# Patient Record
Sex: Male | Born: 1935
Health system: Southern US, Community
[De-identification: ages and names within clinical notes are randomized; demographics above are authoritative.]

## PROBLEM LIST (undated history)

## (undated) DIAGNOSIS — E11319 Type 2 diabetes mellitus with unspecified diabetic retinopathy without macular edema: Secondary | ICD-10-CM

## (undated) DIAGNOSIS — M25551 Pain in right hip: Secondary | ICD-10-CM

## (undated) DIAGNOSIS — M25552 Pain in left hip: Secondary | ICD-10-CM

## (undated) DIAGNOSIS — G8929 Other chronic pain: Secondary | ICD-10-CM

## (undated) DIAGNOSIS — I2583 Coronary atherosclerosis due to lipid rich plaque: Secondary | ICD-10-CM

## (undated) DIAGNOSIS — M545 Low back pain: Secondary | ICD-10-CM

## (undated) DIAGNOSIS — Z794 Long term (current) use of insulin: Secondary | ICD-10-CM

## (undated) DIAGNOSIS — I2510 Atherosclerotic heart disease of native coronary artery without angina pectoris: Secondary | ICD-10-CM

## (undated) DIAGNOSIS — R0600 Dyspnea, unspecified: Secondary | ICD-10-CM

## (undated) DIAGNOSIS — E119 Type 2 diabetes mellitus without complications: Secondary | ICD-10-CM

## (undated) DIAGNOSIS — I10 Essential (primary) hypertension: Secondary | ICD-10-CM

## (undated) DIAGNOSIS — I779 Disorder of arteries and arterioles, unspecified: Secondary | ICD-10-CM

## (undated) DIAGNOSIS — M1990 Unspecified osteoarthritis, unspecified site: Secondary | ICD-10-CM

## (undated) DIAGNOSIS — K219 Gastro-esophageal reflux disease without esophagitis: Secondary | ICD-10-CM

## (undated) DIAGNOSIS — K5792 Diverticulitis of intestine, part unspecified, without perforation or abscess without bleeding: Secondary | ICD-10-CM

## (undated) DIAGNOSIS — J189 Pneumonia, unspecified organism: Secondary | ICD-10-CM

## (undated) DIAGNOSIS — N390 Urinary tract infection, site not specified: Secondary | ICD-10-CM

## (undated) DIAGNOSIS — H35039 Hypertensive retinopathy, unspecified eye: Secondary | ICD-10-CM

## (undated) DIAGNOSIS — F419 Anxiety disorder, unspecified: Secondary | ICD-10-CM

## (undated) DIAGNOSIS — C61 Malignant neoplasm of prostate: Secondary | ICD-10-CM

## (undated) DIAGNOSIS — E7800 Pure hypercholesterolemia, unspecified: Secondary | ICD-10-CM

## (undated) DIAGNOSIS — N189 Chronic kidney disease, unspecified: Secondary | ICD-10-CM

## (undated) HISTORY — PX: CATARACT EXTRACTION: SUR2

## (undated) HISTORY — PX: PROSTATE SURGERY: SHX751

## (undated) HISTORY — PX: CATARACT EXTRACTION, BILATERAL: SHX1313

## (undated) HISTORY — PX: ABDOMINAL SURGERY: SHX537

## (undated) HISTORY — PX: APPENDECTOMY: SHX54

## (undated) HISTORY — DX: Hypertensive retinopathy, unspecified eye: H35.039

## (undated) HISTORY — DX: Type 2 diabetes mellitus with unspecified diabetic retinopathy without macular edema: E11.319

## (undated) HISTORY — PX: EYE SURGERY: SHX253

## (undated) HISTORY — PX: HERNIA REPAIR: SHX51

## (undated) NOTE — *Deleted (*Deleted)
[facility] Note  08/07/2020     CHIEF COMPLAINT Patient presents for No chief complaint on file.   HISTORY OF PRESENT ILLNESS: Justin [name] is a 40 y.o. male who presents to the clinic today for:      Referring physician: [name], MD [id number] [place],  [place] [address]  HISTORICAL INFORMATION:   Selected notes from the MEDICAL RECORD NUMBER Referred by Dr. [name] for concern of mac edema OU   CURRENT MEDICATIONS: No current outpatient medications on file. (Ophthalmic Drugs)   No current facility-administered medications for this visit. (Ophthalmic Drugs)   Current Outpatient Medications (Other)  Medication Sig  . ACCU-CHEK SMARTVIEW test strip   . Accu-Chek Softclix Lancets lancets   . acetaminophen (TYLENOL) 500 MG tablet Take 1,000 mg by mouth every 4 (four) hours as needed for mild pain.   Marland Kitchen AMITIZA 24 MCG capsule Take 24 mcg by mouth as needed for constipation.   Marland Kitchen aspirin EC 81 MG tablet Take 81 mg by mouth daily.  . Blood Glucose Monitoring Suppl (ACCU-CHEK GUIDE) w/Device KIT   . chlorthalidone (HYGROTON) 50 MG tablet Take 25 mg by mouth daily.   . clobetasol cream (TEMOVATE) 0.05 %   . clonazePAM (KLONOPIN) 0.5 MG tablet Take 0.5 mg by mouth daily as needed for anxiety.  . DULoxetine (CYMBALTA) 30 MG capsule   . ezetimibe (ZETIA) 10 MG tablet Take 1 tablet (10 mg total) by mouth daily after supper.  . gabapentin (NEURONTIN) 300 MG capsule   . isosorbide mononitrate (IMDUR) 120 MG 24 hr tablet Take 1 tablet (120 mg total) by mouth daily.  Marland Kitchen JANUVIA 100 MG tablet Take 100 mg by mouth daily.   Marland Kitchen LANTUS SOLOSTAR 100 UNIT/ML Solostar Pen Inject 15-50 Units into the skin See admin instructions. Inject 50 units in the morning and 15 units in the evening  . LINZESS 145 MCG CAPS capsule   . metoprolol (LOPRESSOR) 50 MG tablet Take 25 mg by mouth 2 (two) times daily.  Marland Kitchen omeprazole (PRILOSEC) 40 MG capsule Take  40 mg by mouth daily.   . ramipril (ALTACE) 10 MG capsule   . simvastatin (ZOCOR) 20 MG tablet   . tamsulosin (FLOMAX) 0.4 MG CAPS capsule Take 0.4 mg by mouth daily.  . vitamin B-12 (CYANOCOBALAMIN) 1000 MCG tablet Take 1,000 mcg by mouth daily.  . vitamin B-6 (PYRIDOXINE) 25 MG tablet Take 25 mg by mouth daily.  [name] 2.5 MG TABS tablet TAKE 1 TABLET TWICE DAILY   No current facility-administered medications for this visit. (Other)      REVIEW OF SYSTEMS:    ALLERGIES Allergies  Allergen Reactions  . Contrast Media [Iodinated Diagnostic Agents] Itching    PT STATES ALLERGY TO "CT DYE".  [name] [Red Dye] Swelling    CAT scan dye  . Ibuprofen Other (See Comments)    Upset stomach     PAST MEDICAL HISTORY Past Medical History:  Diagnosis Date  . Anxiety   . Arthritis   . Coronary artery disease   . Diabetes mellitus without complication ([place])   . Diabetic retinopathy ([place])    NPDR OU  . Diverticulitis   . Dyspnea   . GERD (gastroesophageal reflux disease)   . Hypercholesteremia   . Hypertension   . Hypertensive retinopathy    OU  . Pneumonia    as a child  . Prostate cancer ([place])   .  UTI (lower urinary tract infection)    Past Surgical History:  Procedure Laterality Date  . ABDOMINAL SURGERY     for diverticulitis; also removed appendix  . CATARACT EXTRACTION    . CATARACT EXTRACTION, BILATERAL    . CHOLECYSTECTOMY N/A 10/12/2017   Procedure: LAPAROSCOPIC CHOLECYSTECTOMY;  Surgeon: [name], MD;  Location: [facility];  Service: General;  Laterality: N/A;  . CORONARY ARTERY BYPASS GRAFT  2009  . ERCP N/A 12/21/2018   Procedure: ENDOSCOPIC RETROGRADE CHOLANGIOPANCREATOGRAPHY (ERCP);  Surgeon: [name], MD;  Location: [facility];  Service: Endoscopy;  Laterality: N/A;  . ESOPHAGOGASTRODUODENOSCOPY (EGD) WITH PROPOFOL N/A 12/17/2018   Procedure: ESOPHAGOGASTRODUODENOSCOPY (EGD) WITH PROPOFOL;  Surgeon: [name], MD;  Location: [facility];  Service: Endoscopy;  Laterality: N/A;  . EUS Left 12/17/2018   Procedure: UPPER ENDOSCOPIC ULTRASOUND (EUS) LINEAR;  Surgeon: [name], MD;  Location: [facility];  Service: Endoscopy;  Laterality: Left;  . EYE SURGERY     "for bleeding in eye"  . HERNIA REPAIR    . LEFT HEART CATH AND CORONARY ANGIOGRAPHY N/A 11/04/2016   Procedure: Left Heart Cath and Coronary Angiography;  Surgeon: [name], MD;  Location: [facility];  Service: Cardiovascular;  Laterality: N/A;  . LOWER EXTREMITY ANGIOGRAPHY N/A 11/04/2016   Procedure: Lower Extremity Angiography;  Surgeon: [name], MD;  Location: [facility];  Service: Cardiovascular;  Laterality: N/A;  . LOWER EXTREMITY ANGIOGRAPHY N/A 01/03/2020   Procedure: LOWER EXTREMITY ANGIOGRAPHY;  Surgeon: [name], MD;  Location: [facility];  Service: Cardiovascular;  Laterality: N/A;  . PROSTATE SURGERY    . REMOVAL OF STONES  12/21/2018   Procedure: REMOVAL OF STONES;  Surgeon: [name], MD;  Location: [facility];  Service: Endoscopy;;  . [name]  12/21/2018   Procedure: [name];  Surgeon: [name], MD;  Location: [facility];  Service: Endoscopy;;    FAMILY HISTORY Family History  Problem Relation Age of Onset  . Diabetes Father   . Diabetes Maternal Aunt   . Diabetes Maternal Uncle   . Diabetes Maternal Grandmother   . Heart disease Sister   . Diabetes Brother   . Heart disease Sister     SOCIAL HISTORY Social History   Tobacco Use  . Smoking status: Former Smoker    Packs/day: 0.25    Years: 2.00    Pack years: 0.50    Types: Cigarettes  . Smokeless tobacco: Never Used  . Tobacco comment: when he was a teenage age [age]  Vaping Use  . Vaping Use: Never used  Substance Use Topics  . Alcohol use: No  . Drug use: No         OPHTHALMIC EXAM:  Not recorded     IMAGING AND PROCEDURES  Imaging and Procedures for _0 @           ASSESSMENT/PLAN:  No  diagnosis found.  1,2. Moderate non-proliferative diabetic retinopathy with edema OU   - moved to [place] from [place], [place] -- history of prior injections OS with Dr. [name]  - records received and reviewed from [facility] [place] (Dr. [name])  - history of focal laser OS x2 and IVK/IVT OU in [place] -- last injection was IVK OS November 2013  - initial exam: scattered IRH, DBH and +macular edema  - initial OCT: diabetic macular edema, both eyes, (OD > OS)  - FA (10.1.19) showed late leaking MA OU -- no NV  - delayed f/u on 4.16.20  due to hospitalization for sepsis / gallstones  - S/P PRP OD (07.14.20) - good laser surrounding, room for posterior fill in if needed  - S/P PRP OS (08.26.20)  - S/P IVA OD #1 (09.17.19), #2 (10.29.19), #3 (11.26.19), #4 (01.02.20), #5 (01.30.20), #6 (02.28.20), #7 (04.16.20), #8 (06.11.20), #9 (08.12.20), #10 (01.11.21)  - S/P IVA OS #1 (10.01.19), #2 (10.29.19), #3 (11.26.19), #4 (01.02.20), #5 (01.30.20), #6 (02.28.20), #7 (04.16.20), #8 (05.14.20), #9 (06.11.20), #10 (07.09.20), #11 (08.12.20), #12 (01.11.21)  - S/P IVK OD #1 (05.14.20) -- no significant improvement  - switched back to Avastin OD (#9 6.11.2020)  - repeated Eylea4U benefits investigation due to possible IVA resistance (8.12.20)  - S/P IVE OS #1 (09.16.20), #2 (10.16.20), #3 (11.13.20), #4 (12.11.20), #5 (02.08.21), #6 (03.12.21), #7 (04.16.21), #8 (05.14.21), #9 (06.18.21), #10 (07.23.21), #11 (8.30.21), #12 (10.5.21)  - S/P IVE OD #1 (09.16.20), #2 (10.16.20), #3 (11.13.20), #4 (12.11.20), #5 (02.08.21), #6 (03.12.21), #7 (04.16.21), #8 (05.14.21), #9 (06.18.21), #10 (07.23.21), #11 (8.30.21), #12 (10.5.21)  - OCT today shows mild interval improvement in cystic changes OS; OD persistent central cysts  - BCVA stable at 20/30 OU  - recommend IVE OU #13 today, 11.9.21  - Eylea4U benefits investigation completed and verified for 2021 as of 02.08.21  - pt wishes to proceed  - RBA of  procedure discussed, questions answered  - informed consent obtained  - see procedure note  - Eylea informed consent form signed and scanned on 12.11.2020  - f/u 5 weeks, DFE, OCT, possible injection  3,4. Hypertensive retinopathy OU  - discussed importance of tight BP control  - monitor  5. Pseudophakia OU  - s/p CE/IOL OU in [place], [place]  - beautiful surgeries, doing well  - monitor   Ophthalmic Meds Ordered this visit:  No orders of the defined types were placed in this encounter.      No follow-ups on file.  There are no Patient Instructions on file for this visit.  This document serves as a record of services personally performed by Gardiner Sleeper, MD, PhD. It was created on their behalf by Estill Bakes, COT an ophthalmic technician. The creation of this record is the provider's dictation and/or activities during the visit.    Electronically signed by: Estill Bakes, COT 11.4.21 @ 1:10 PM  Abbreviations: M myopia (nearsighted); A astigmatism; H hyperopia (farsighted); P presbyopia; Mrx spectacle prescription;  CTL contact lenses; OD right eye; OS left eye; OU both eyes  XT exotropia; ET esotropia; PEK punctate epithelial keratitis; PEE punctate epithelial erosions; DES dry eye syndrome; MGD meibomian gland dysfunction; ATs artificial tears; PFAT's preservative free artificial tears; [place] nuclear sclerotic cataract; PSC posterior subcapsular cataract; ERM epi-retinal membrane; PVD posterior vitreous detachment; RD retinal detachment; DM diabetes mellitus; DR diabetic retinopathy; NPDR non-proliferative diabetic retinopathy; PDR proliferative diabetic retinopathy; CSME clinically significant macular edema; DME diabetic macular edema; dbh dot blot hemorrhages; CWS cotton wool spot; POAG primary open angle glaucoma; C/D cup-to-disc ratio; HVF humphrey visual field; GVF goldmann visual field; OCT optical coherence tomography; IOP intraocular pressure; BRVO Branch retinal vein  occlusion; CRVO central retinal vein occlusion; CRAO central retinal artery occlusion; BRAO branch retinal artery occlusion; RT retinal tear; SB scleral buckle; PPV pars plana vitrectomy; VH Vitreous hemorrhage; PRP panretinal laser photocoagulation; IVK intravitreal kenalog; VMT vitreomacular traction; MH Macular hole;  NVD neovascularization of the disc; NVE neovascularization elsewhere; AREDS age related eye disease study; ARMD age related macular degeneration; POAG primary open angle glaucoma; EBMD epithelial/anterior basement  membrane dystrophy; ACIOL anterior chamber intraocular lens; IOL intraocular lens; PCIOL posterior chamber intraocular lens; Phaco/IOL phacoemulsification with intraocular lens placement; [place] photorefractive keratectomy; LASIK laser assisted in situ keratomileusis; HTN hypertension; DM diabetes mellitus; COPD chronic obstructive pulmonary disease

---

## 2007-09-30 DIAGNOSIS — I219 Acute myocardial infarction, unspecified: Secondary | ICD-10-CM

## 2007-09-30 HISTORY — PX: CORONARY ARTERY BYPASS GRAFT: SHX141

## 2007-09-30 HISTORY — DX: Acute myocardial infarction, unspecified: I21.9

## 2012-07-20 NOTE — Progress Notes (Signed)
Chief Complaint   Patient presents with   ??? Diabetes     pcp and pharmacy confirmed     History of Present Illness: Terry Hamilton is a 78 y.o. male who is a new patient for evaluation of diabetes.  Was diagnosed with diabetes in his early 15s and was put on lantus about 2 years ago and was initially on 30 units and has gone up to 50 units which he takes at 7pm and takes [place] in the morning.  Did take metformin but had diarrhea from this.  Checks blood sugars 2 times per day and readings run in the 80-130s fasting but can have some overnight lows into the 60s and tends to eat at bedtime to prevent lows.  Has had some readings in the 200s and one over 300.  30 day avg is 143.  Most recent Hgb A1c was 8.5% in July. A typical day is as follows:  - breakfast: sausage and egg or cereal with 1 slice of toast with coffee, occ pancakes  - lunch: half sandwich with applesauce, no chips or fries  - dinner: pork chop with broccoli or baked potato, chicken with cabbage or cauliflower, no rice, rare pasta, occ sandwich, 1-2 times a week no sugar added ice cream or peaches  - beverages: diet soda, unsweet drinks  - snacks: 1/2 sandwich or peaches before bed  Exercise consists of walking 3 days a week for about 30 minutes.  (+) history of vascular disease with CABG in 2008.  (+) history of retinopathy with laser treatments, (+) neuropathy, (+) mild nephropathy with microalbumin of 75 in 4/13.  Last eye exam was about 3 months ago and goes every 3 months.    Past Medical History   Diagnosis Date   ??? IBS (irritable bowel syndrome)    ??? Diverticulosis      with diverticulitis   ??? Other and unspecified hyperlipidemia    ??? Type II or unspecified type diabetes mellitus without mention of complication, uncontrolled Age 76   ??? CAD (coronary artery disease)      s/p CABG   ??? GERD (gastroesophageal reflux disease)    ??? Prostate cancer    ??? Unspecified essential hypertension      Past Surgical History   Procedure Laterality Date   ??? Hx  coronary artery bypass graft  2008   ??? Hx hernia repair       bilateral   ??? Hx appendectomy     ??? Hx cholecystectomy     ??? Hx laparotomy       for diverticulitis x3 with part of colon removed, wore colostomy for a shot period   ??? Hx prostatectomy     ??? Hx cataract removal       bilateral     Current Outpatient Prescriptions   Medication Sig   ??? clonazePAM (KLONOPIN) 0.5 mg tablet Take  by mouth nightly as needed.   ??? flavoxATE (URISPAS) 100 mg tablet Take 100 mg by mouth two (2) times a day.   ??? furosemide (LASIX) 20 mg tablet Take  by mouth every other day.   ??? sitaGLIPtin (JANUVIA) 100 mg tablet Take 100 mg by mouth daily.   ??? insulin glargine (LANTUS SOLOSTAR) 100 unit/mL (3 mL) pen 50 Units by SubCUTAneous route daily (after dinner).   ??? metoprolol (LOPRESSOR) 50 mg tablet Take  by mouth two (2) times a day.   ??? omeprazole (PRILOSEC) 20 mg capsule Take 20 mg by mouth daily.   ???  ramipril (ALTACE) 10 mg capsule Take 10 mg by mouth daily.   ??? simvastatin (ZOCOR) 20 mg tablet Take  by mouth nightly.   ??? lubiPROStone (AMITIZA) 8 mcg capsule Take 8 mcg by mouth daily (with breakfast).     No current facility-administered medications for this visit.     Allergies   Allergen Reactions   ??? Contrast Agent (Iodine) Rash   ??? Ibuprofen Other (comments)     GI upset   ??? Lipitor (Atorvastatin) Myalgia   ??? Zetia (Ezetimibe) Other (comments)     Had trouble with his eyes that improved upon stopping this med     Family History   Problem Relation Age of Onset   ??? Diabetes Father      with amputation   ??? Diabetes Maternal Grandmother      with blindness   ??? Diabetes Brother      History     Social History   ??? Marital Status: WIDOWED     Spouse Name: N/A     Number of Children: N/A   ??? Years of Education: N/A     Occupational History   ??? Not on file.     Social History Main Topics   ??? Smoking status: Former Smoker     Quit date: 07/20/1962   ??? Smokeless tobacco: Not on file   ??? Alcohol Use: No   ??? Drug Use: No   ??? Sexually Active:  Not on file     Other Topics Concern   ??? Not on file     Social History Narrative    Lives in [place] with his maternal aunt who is 26 yo.  Widowed since 1985.  Has 4 daughters and 3 step-sons.  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  Likes to work in the yard.       Review of Systems:  - Constitutional Symptoms: no fevers, chills, weight loss  - Eyes: some blurry vision with cloudy appearance, no double vision  - Cardiovascular: no chest pain or palpitations  - Respiratory: no cough or shortness of breath  - Gastrointestinal: no dysphagia, intermittent abdominal pain and alternating diarrhea and constipation  - Musculoskeletal: no joint pains or weakness  - Integumentary: no rashes  - Neurological: some numbness, tingling, no headaches  - Psychiatric: no depression or anxiety  - Endocrine: no heat or cold intolerance, no polyuria or polydipsia    Physical Examination:  Blood pressure 134/60, pulse 56, height 5' 7.5" (1.715 m), weight 188 lb 9.6 oz (85.548 kg).  - General: pleasant, no distress, good eye contact  - HEENT: no exopthalmos, no periorbital edema, no scleral/conjunctival injection, EOMI, no lid lag or stare  - Neck: supple, no thyromegaly, masses, lymph nodes, or carotid bruits, no supraclavicular or dorsocervical fat pads  - Cardiovascular: regular, normal rate, normal S1 and S2, no murmurs/rubs/gallops, 1+ dorsalis pedis pulses bilaterally  - Respiratory: clear to auscultation bilaterally  - Gastrointestinal: soft, nontender, nondistended, no masses, no hepatosplenomegaly  - Musculoskeletal: no proximal muscle weakness in upper or lower extremities  - Integumentary: no acanthosis nigricans, no abdominal striae, no rashes, no edema, no foot ulcers, (+) calluses bilaterally  - Neurological: decreased sensation to monofilament 4/4 locations, minimal vibratory sensation at great toes bilaterally,   - Psychiatric: normal mood and affect    Data Reviewed:   - Hgb A1c  8.5%  - lipids: total 162 ,  HDL 44, TG 79, LDL 102  4/13  - ALT 16, AST 18  - BUN/Cr 16/1.13  - microalbumin 75 4/13    Assessment/Plan:   1. Type II or unspecified type diabetes mellitus without mention of complication, uncontrolled: his most recent Hgb A1c was 8.5% in 7/13.  I'm concerned about his risk of overnight lows given he lives with a 56 yo aunt and his h/o CAD.  I agree that a goal A1c is 8% or less.  I will have him split the lantus to try and avoid the lows and see if he can get by without a bedtime snack.  Not sure how much the [place] is helping so will stop this for now given his duration of DM for almost 40 years.   - stop [place]  - split lantus to 20 units in the morning and 30 units at night  - check bs 2 times per day   - optho UTD every 3 months  - foot exam done 10/13  - microalbumin 75 4/13     2. Unspecified essential hypertension: his BP was above goal < 130/80 but given this was his first visit with me, will not make any changes at this time.   - cont metoprolol 50 mg bid  - cont ramipril 10 mg daily      3. Other and unspecified hyperlipidemia: Given DM and CAD, Goal LDL < 70, non-HDL < 100, and TG < 150.  LDL 102 in 4/13.  May need an increase in simva at next visit.  - check lipids prior to next visit  - cont simva 20 mg daily      Patient Instructions   1) Starting tonight decrease your dose to 30 units at 7pm and tomorrow morning start taking 20 units at 7am before breakfast.    2) Check blood sugars twice daily.  One time should always be fasting (goal is 90-150).  The other time should be 2 hours after a meal (goal is less than 180).  Alternate one meal a day to check (example: one day check after breakfast, one day after lunch, one day after dinner).  Write down what you ate if your blood sugar is more than 180 so you can know to cut back or cut out this food from your diet.    3) Stop the [place].  If you notice your sugars are rising over 200 consistently then go back on this.       4) Have Dr. Loney Hering repeat your labs prior to your next visit and fax me a copy of the results.  Take him the order I gave you today.          Follow-up Disposition:  Return in about 3 months (around 10/20/2012).    Copy sent to:  Dr. Daine Gravel

## 2012-07-20 NOTE — Patient Instructions (Signed)
1) Starting tonight decrease your dose to 30 units at 7pm and tomorrow morning start taking 20 units at 7am before breakfast.    2) Check blood sugars twice daily.  One time should always be fasting (goal is 90-150).  The other time should be 2 hours after a meal (goal is less than 180).  Alternate one meal a day to check (example: one day check after breakfast, one day after lunch, one day after dinner).  Write down what you ate if your blood sugar is more than 180 so you can know to cut back or cut out this food from your diet.    3) Stop the [place].  If you notice your sugars are rising over 200 consistently then go back on this.      4) Have Dr. Loney Hering repeat your labs prior to your next visit and fax me a copy of the results.  Take him the order I gave you today.

## 2014-10-10 ENCOUNTER — Ambulatory Visit: Admit: 2014-10-10 | Discharge: 2014-10-10 | Payer: MEDICARE | Attending: Family Medicine | Primary: Family Medicine

## 2014-10-10 DIAGNOSIS — Z131 Encounter for screening for diabetes mellitus: Secondary | ICD-10-CM

## 2014-10-10 MED ORDER — RAMIPRIL 10 MG CAP
10 mg | ORAL_CAPSULE | Freq: Every day | ORAL | Status: DC
Start: 2014-10-10 — End: 2015-05-15

## 2014-10-10 MED ORDER — INSULIN GLARGINE 100 UNIT/ML (3 ML) SUB-Q PEN
100 unit/mL (3 mL) | Freq: Every day | SUBCUTANEOUS | Status: DC
Start: 2014-10-10 — End: 2015-01-30

## 2014-10-10 MED ORDER — FUROSEMIDE 20 MG TAB
20 mg | ORAL_TABLET | Freq: Every day | ORAL | Status: DC
Start: 2014-10-10 — End: 2015-05-18

## 2014-10-10 MED ORDER — SITAGLIPTIN 100 MG TAB
100 mg | ORAL_TABLET | Freq: Every day | ORAL | Status: DC
Start: 2014-10-10 — End: 2014-11-20

## 2014-10-10 MED ORDER — SIMVASTATIN 20 MG TAB
20 mg | ORAL_TABLET | Freq: Every evening | ORAL | Status: DC
Start: 2014-10-10 — End: 2015-05-18

## 2014-10-10 MED ORDER — METOPROLOL TARTRATE 50 MG TAB
50 mg | ORAL_TABLET | Freq: Two times a day (BID) | ORAL | Status: DC
Start: 2014-10-10 — End: 2015-05-15

## 2014-10-10 MED ORDER — LUBIPROSTONE 8 MCG CAP
8 mcg | ORAL_CAPSULE | Freq: Every day | ORAL | Status: DC
Start: 2014-10-10 — End: 2015-05-15

## 2014-10-10 MED ORDER — OMEPRAZOLE 20 MG CAP, DELAYED RELEASE
20 mg | ORAL_CAPSULE | Freq: Every day | ORAL | Status: DC
Start: 2014-10-10 — End: 2015-05-15

## 2014-10-10 MED ORDER — INSULIN GLARGINE 100 UNIT/ML (3 ML) SUB-Q PEN
100 unit/mL (3 mL) | Freq: Every day | SUBCUTANEOUS | Status: DC
Start: 2014-10-10 — End: 2014-10-10

## 2014-10-10 MED ORDER — FLAVOXATE 100 MG TAB
100 mg | ORAL_TABLET | Freq: Two times a day (BID) | ORAL | Status: DC
Start: 2014-10-10 — End: 2015-05-15

## 2014-10-10 NOTE — Progress Notes (Signed)
Subjective:     Terry Hamilton is a 81 y.o. male who presents today for follow up on his chronic medical conditions as noted below. Here to establish transferring from Dr. Kenna Gilbert practice most recently seen by Dr. Loreta Ave in December for physical exam.  Mostly recent lab work which he provides [place] as 8/15.  Will be reviewed below  Has a history of chronic epigastric pain the last 6-7 years most recent evaluation was by general surgery in [facility] did have a colonoscopy 2014 showing diverticular disease one polyp uncertain pathology diagnosis was irritable bowel.  No interval change in abdominal pain no nausea vomiting history of chronic constipation not using any fiber agents encouraged to do so  No melena no hematochezia no hematemesis no history of peptic ulcer disease does have a history of GERD  Chronic lower abdomen pain felt to be secondary to radiation  treatment  for prostate cancer continue to follow with urology q.6 months.  Blood sugar fairly well controlled 100+ or -20. Up-to-date on eye exams last was 6 months ago  History coronary artery disease three-vessel CABG 2009 cardiologist is Dr. Irma Newness but he has not seen in some time he will make a followup appointment    No chest pain, no angina no shortness of breath. No orthopnea or PND. No dependent edema.  No change in abdominal pain, no change in bowel habits, no blood in stool or black stools.  No urinary frequency, urgency, dysuria. No change in voiding pattern or stream, no significant nocturia.  No problems with medications and is compliant.      Past Medical History   Diagnosis Date   ??? IBS (irritable bowel syndrome)    ??? Diverticulosis      with diverticulitis   ??? Other and unspecified hyperlipidemia    ??? Type II or unspecified type diabetes mellitus without mention of complication, uncontrolled (HCC) Age 73   ??? CAD (coronary artery disease)      s/p CABG, Dr Donata Duff   ??? GERD (gastroesophageal reflux disease)    ??? Prostate cancer (HCC)       FU Dr Jeannine Kitten   ??? Unspecified essential hypertension    ??? Abdominal pain      chronic epigastric, 6-7 years      Past Surgical History   Procedure Laterality Date   ??? Hx coronary artery bypass graft  2008   ??? Hx hernia repair       bilateral   ??? Hx appendectomy     ??? Hx cholecystectomy     ??? Hx laparotomy       for diverticulitis x3 with part of colon removed, wore colostomy for a shot period   ??? Hx prostatectomy     ??? Hx cataract removal       bilateral   ??? Hx colonoscopy  2014     tics, polyp     Current Outpatient Prescriptions   Medication Sig Dispense Refill   ??? lubiPROStone (AMITIZA) 8 mcg capsule Take 1 Cap by mouth daily (with breakfast). prn 30 Cap 3   ??? sitaGLIPtin (JANUVIA) 100 mg tablet Take 1 Tab by mouth daily. 30 Tab 5   ??? insulin glargine (LANTUS SOLOSTAR) 100 unit/mL (3 mL) pen 50 Units by SubCUTAneous route daily. 3 Each 3   ??? flavoxATE (URISPAS) 100 mg tablet Take 1 Tab by mouth two (2) times a day. 180 Tab 1   ??? furosemide (LASIX) 20 mg tablet Take 1 Tab by mouth  daily. 90 Tab 1   ??? metoprolol (LOPRESSOR) 50 mg tablet Take 1 Tab by mouth two (2) times a day. 180 Tab 1   ??? omeprazole (PRILOSEC) 20 mg capsule Take 1 Cap by mouth daily. 90 Cap 1   ??? ramipril (ALTACE) 10 mg capsule Take 1 Cap by mouth daily. 90 Cap 1   ??? simvastatin (ZOCOR) 20 mg tablet Take 1 Tab by mouth nightly. 90 Tab 1   ??? clonazePAM (KLONOPIN) 0.5 mg tablet Take  by mouth nightly as needed.       Family History   Problem Relation Age of Onset   ??? Diabetes Father      with amputation   ??? Diabetes Maternal Grandmother      with blindness   ??? Diabetes Brother      Allergies   Allergen Reactions   ??? Contrast Agent [Iodine] Rash   ??? Ibuprofen Other (comments)     GI upset   ??? Lipitor [Atorvastatin] Myalgia   ??? Zetia [Ezetimibe] Other (comments)     Had trouble with his eyes that improved upon stopping this med       History   Substance Use Topics   ??? Smoking status: Former Smoker     Quit date: 07/20/1962    ??? Smokeless tobacco: Not on file   ??? Alcohol Use: No        Review of Systems      HEENT no headaches, double vision, blurred vision, no epistaxis or frequent sore throat.  LUNGS no history of asthma, COPD. No shortness of breath, no cough, no respiratory distress.  HEART no chest pain. No angina, no history of CHF No orthopnea, PND or dependent edema. CABG X 3  GI no history of hepatitis, jaundice, gallbladder disease.  No change in bowel habits, chronic constipation. No melena or hematochezia, chronic abd pain  GU No history of renal insufficiency, ureterolithiasis, no change in frequency or stream. Hx prostate CA  NEURO No severe headaches. No paresthesia anesthesia, motor weakness.            Objective:     Wt Readings from Last 3 Encounters:   10/10/14 189 lb (85.73 kg)   07/20/12 188 lb 9.6 oz (85.548 kg)     Temp Readings from Last 3 Encounters:   10/10/14 97.6 ??F (36.4 ??C) Temporal     BP Readings from Last 3 Encounters:   10/10/14 142/70   07/20/12 134/60     Pulse Readings from Last 3 Encounters:   10/10/14 68   07/20/12 56     General Appearance:    Alert, cooperative, no distress, appears stated age   Head:    Normocephalic, without obvious abnormality, atraumatic      Eyes:    PERRL, conjunctiva/corneas clear, EOM's intact, fundi     benign, both eyes, IOL bilat   Ears:    Normal TM's and external ear canals, both ears   Nose:   Nares normal, septum midline, mucosa normal, no drainage        or sinus tenderness   Throat:   Lips, mucosa, and tongue normal; teeth and gums normal   Neck:   Supple, symmetrical, trachea midline, no adenopathy;     thyroid:  no enlargement/tenderness/nodules; no carotid    bruit or JVD   Back:     Symmetric, no curvature, ROM normal, no CVA tenderness   Lungs:     Clear to auscultation bilaterally, respirations unlabored  Chest Wall:    No tenderness or deformity      Heart:    Regular rate and rhythm, S1 and S2 normal, no murmur, rub        or gallop                           Abdomen:                         Extremities:                            Pulses:                              Skin:                  Lymph nodes:                    Neurologic:                  Slight to moderate epigastric discomfort on palpation chronic per patient no rebound guarding masses or organomegaly bowel sounds active all quadrants surgical scar is noted    Extremities normal, atraumatic, no     cyanosis or edema     2+ and symmetric all extremities     Skin color, texture, turgor normal, no rashes or lesions       Cervical, supraclavicular, and axillary   nodes normal     CNII-XII intact, normal strength, sensation and reflexes   Throughout                  Lab reviewed from 8/15 BMP is normal except for a glucose of 160 HDL was 42 LDL 90 total cholesterol 146 hemoglobin A1c 8.5 vitamin D was slightly low at 25.2  CBC was normal.  Lab scanned  into system        Assessment / Plan:     1. DM (diabetes mellitus screen)    2. Type 2 diabetes mellitus with hyperglycemia (HCC)    3. Essential hypertension    4. Hyperlipemia    5. Prostate ca (HCC)    6. Epigastric pain chronic           Plan  Orders Placed This Encounter   ??? COLLECTION VENOUS BLOOD,VENIPUNCTURE   ??? HEMOGLOBIN A1C + EAG ESTIMATION   ??? AST   ??? LIPID PANEL   ??? AMYLASE   ??? LIPASE   ??? METABOLIC PANEL, COMPREHENSIVE   ??? CBC WITH AUTOMATED DIFF   ??? DISCONTD: insulin glargine (LANTUS SOLOSTAR) 100 unit/mL (3 mL) pen     Sig: 50 Units by SubCUTAneous route daily.     Dispense:  1 Each     Refill:  3   ??? lubiPROStone (AMITIZA) 8 mcg capsule     Sig: Take 1 Cap by mouth daily (with breakfast). prn     Dispense:  30 Cap     Refill:  3   ??? sitaGLIPtin (JANUVIA) 100 mg tablet     Sig: Take 1 Tab by mouth daily.     Dispense:  30 Tab     Refill:  5   ??? insulin glargine (LANTUS SOLOSTAR) 100 unit/mL (3 mL) pen     Sig: 50 Units by SubCUTAneous route daily.  Dispense:  3 Each     Refill:  3   ??? flavoxATE (URISPAS) 100 mg tablet      Sig: Take 1 Tab by mouth two (2) times a day.     Dispense:  180 Tab     Refill:  1   ??? furosemide (LASIX) 20 mg tablet     Sig: Take 1 Tab by mouth daily.     Dispense:  90 Tab     Refill:  1   ??? metoprolol (LOPRESSOR) 50 mg tablet     Sig: Take 1 Tab by mouth two (2) times a day.     Dispense:  180 Tab     Refill:  1   ??? omeprazole (PRILOSEC) 20 mg capsule     Sig: Take 1 Cap by mouth daily.     Dispense:  90 Cap     Refill:  1   ??? ramipril (ALTACE) 10 mg capsule     Sig: Take 1 Cap by mouth daily.     Dispense:  90 Cap     Refill:  1   ??? simvastatin (ZOCOR) 20 mg tablet     Sig: Take 1 Tab by mouth nightly.     Dispense:  90 Tab     Refill:  1   lab as above continue present medications  His lab unremarkable given persistent pain consider EGD  Followup with specialist as they direct, he will make followup appointment with his cardiologist  followup here 3 months or p.r.n.  Obtain old records from previous PCP for review      (Please note that portions of this note were completed with a voice recognition program. Efforts were made to edit the dictations but occasionally words are mis-transcribed.)

## 2014-10-11 LAB — METABOLIC PANEL, BASIC
BUN/Creatinine ratio: 13 (ref 10–22)
BUN: 14 mg/dL (ref 8–27)
CO2: 25 mmol/L (ref 18–29)
Calcium: 9.2 mg/dL (ref 8.6–10.2)
Chloride: 105 mmol/L (ref 97–108)
Creatinine: 1.04 mg/dL (ref 0.76–1.27)
GFR est AA: 79 mL/min/{1.73_m2} (ref >59)
GFR est non-AA: 68 mL/min/{1.73_m2} (ref >59)
Glucose: 279 mg/dL — ABNORMAL HIGH (ref 65–99)
Potassium: 4.5 mmol/L (ref 3.5–5.2)
Sodium: 143 mmol/L (ref 134–144)

## 2014-10-11 LAB — HEMOGLOBIN A1C WITH EAG
Estimated average glucose: 209 mg/dL
Hemoglobin A1c: 8.9 % — ABNORMAL HIGH (ref 4.8–5.6)

## 2014-10-11 LAB — LIPID PANEL
Cholesterol, total: 155 mg/dL (ref 100–199)
HDL Cholesterol: 44 mg/dL (ref >39)
LDL, calculated: 91 mg/dL (ref 0–99)
Triglyceride: 101 mg/dL (ref 0–149)
VLDL, calculated: 20 mg/dL (ref 5–40)

## 2014-10-11 LAB — AST: AST (SGOT): 14 IU/L (ref 0–40)

## 2014-10-11 MED ORDER — METFORMIN 500 MG TAB
500 mg | ORAL_TABLET | Freq: Two times a day (BID) | ORAL | Status: DC
Start: 2014-10-11 — End: 2014-10-12

## 2014-10-11 NOTE — Progress Notes (Signed)
Quick Note:        No oral agents, what problem did he have with metformin, if GI upset diarrhea could give long-acting metformin which has less problems along that line    ______

## 2014-10-11 NOTE — Progress Notes (Signed)
Quick Note:        Called patient he has been informed of labs , but stated he can not tolerate Metformin can another med be called in?    ______

## 2014-10-11 NOTE — Progress Notes (Signed)
Quick Note:        Cholesterol is good sugar is high add metformin 500 mg b.i.d. Sent one-month prescription to [facility] if he tolerates was some 3 months to his mail in, continue other medications    Recheck labs in 3 months    ______

## 2014-10-11 NOTE — Progress Notes (Signed)
Quick Note:        ML for patient to call back    ______

## 2014-10-12 MED ORDER — METFORMIN ER 500 MG 24 HR TABLET,EXTENDED RELEASE
500 mg | ORAL_TABLET | Freq: Every day | ORAL | Status: DC
Start: 2014-10-12 — End: 2015-01-25

## 2014-10-12 MED ORDER — LUBIPROSTONE 24 MCG CAP
24 mcg | ORAL_CAPSULE | Freq: Every day | ORAL | Status: AC
Start: 2014-10-12 — End: 2014-11-11

## 2014-10-12 NOTE — Progress Notes (Signed)
Quick Note:        Patient has been advised and has agreed to start this med.    ______

## 2014-10-12 NOTE — Telephone Encounter (Signed)
I called pharm

## 2014-10-12 NOTE — Telephone Encounter (Signed)
Patient needs a new rx for Amitiza  24 mg.  The last rx was for 8 mg and patient stated he was originally one 24 mg.  Also needs rx for long acting metformin.  Patient has been advised per Dr Ralph Leydenoyle may take 3 of 8 mg Amitiza until he gets new rx.

## 2014-10-12 NOTE — Telephone Encounter (Signed)
Pharmacist has questions about patient's diabetic meds (type of release and the cost).  Please call.

## 2014-10-16 NOTE — Telephone Encounter (Signed)
Patient advised per Dr. Harry Doyle

## 2014-10-16 NOTE — Telephone Encounter (Signed)
Patient feels the metformin is too strong and he felt "foolish" this morning.  He has cut it back to one tab.  Please call on how he's suppose to take it.

## 2014-10-16 NOTE — Telephone Encounter (Signed)
Okay to start with one a day monitor blood sugar bring in for review 6 weeks

## 2014-11-20 NOTE — Telephone Encounter (Signed)
Send to [facility].

## 2014-11-21 MED ORDER — SITAGLIPTIN 100 MG TAB
100 mg | ORAL_TABLET | Freq: Every day | ORAL | Status: DC
Start: 2014-11-21 — End: 2015-05-15

## 2015-01-09 ENCOUNTER — Ambulatory Visit: Admit: 2015-01-09 | Discharge: 2015-01-09 | Payer: MEDICARE | Attending: Family Medicine | Primary: Family Medicine

## 2015-01-09 DIAGNOSIS — R1032 Left lower quadrant pain: Secondary | ICD-10-CM

## 2015-01-09 MED ORDER — AMOXICILLIN CLAVULANATE 875 MG-125 MG TAB
875-125 mg | ORAL_TABLET | Freq: Two times a day (BID) | ORAL | Status: AC
Start: 2015-01-09 — End: 2015-01-19

## 2015-01-09 NOTE — Progress Notes (Signed)
Subjective:     Terry Hamilton is a 81 y.o. male who presents today for follow up ER visit on 01/07/15. Has had  abdominal pain left lower quadrant for about a week worse on Sunday.  Went to the ER evaluation included CT of the abdomen and pelvis which was negative for CBC CMP UA all unremarkable chest x-ray which was negative EKG was unremarkable. Given pain medications and told to follow up here. He denies any fever chills nausea vomiting history of constipation last bowel movement was yesterday is on medication for constipation. Has a history of diverticulitis surgery for diverticulosis. Has had recurrent intermittent abdominal pain previously diagnosed as severe well bowel syndrome and diverticulitis.   No urinary tract symptoms such as frequency urgency dysuria hematuria or pyuria no flank pain  No chest pain or shortness of breath or associated cardiac symptoms      Past Medical History   Diagnosis Date   ??? IBS (irritable bowel syndrome)    ??? Diverticulosis      with diverticulitis   ??? Other and unspecified hyperlipidemia    ??? Type II or unspecified type diabetes mellitus without mention of complication, uncontrolled (HCC) Age 35   ??? CAD (coronary artery disease)      s/p CABG, Dr Donata Duff   ??? GERD (gastroesophageal reflux disease)    ??? Prostate cancer (HCC)      FU Dr Jeannine Kitten   ??? Unspecified essential hypertension    ??? Abdominal pain      chronic epigastric, 6-7 years      Past Surgical History   Procedure Laterality Date   ??? Hx coronary artery bypass graft  2008   ??? Hx hernia repair       bilateral   ??? Hx appendectomy     ??? Hx cholecystectomy     ??? Hx laparotomy       for diverticulitis x3 with part of colon removed, wore colostomy for a shot period   ??? Hx prostatectomy     ??? Hx cataract removal       bilateral   ??? Hx colonoscopy  2014     tics, polyp     Current Outpatient Prescriptions   Medication Sig Dispense Refill   ??? HYDROcodone-acetaminophen (NORCO) 5-325 mg per tablet Take 1 Tab by  mouth every four (4) hours as needed for Pain.     ??? amoxicillin-clavulanate (AUGMENTIN) 875-125 mg per tablet Take 1 Tab by mouth every twelve (12) hours for 10 days. 20 Tab 0   ??? sitaGLIPtin (JANUVIA) 100 mg tablet Take 1 Tab by mouth daily. 30 Tab 5   ??? metFORMIN (GLUMETZA ER) 500 mg TG24 24 hour tablet Take 1,000 mg by mouth daily. 60 Tab 5   ??? lubiPROStone (AMITIZA) 8 mcg capsule Take 1 Cap by mouth daily (with breakfast). prn 30 Cap 3   ??? insulin glargine (LANTUS SOLOSTAR) 100 unit/mL (3 mL) pen 50 Units by SubCUTAneous route daily. 3 Each 3   ??? flavoxATE (URISPAS) 100 mg tablet Take 1 Tab by mouth two (2) times a day. 180 Tab 1   ??? furosemide (LASIX) 20 mg tablet Take 1 Tab by mouth daily. 90 Tab 1   ??? metoprolol (LOPRESSOR) 50 mg tablet Take 1 Tab by mouth two (2) times a day. 180 Tab 1   ??? omeprazole (PRILOSEC) 20 mg capsule Take 1 Cap by mouth daily. 90 Cap 1   ??? ramipril (ALTACE) 10 mg capsule Take 1  Cap by mouth daily. 90 Cap 1   ??? simvastatin (ZOCOR) 20 mg tablet Take 1 Tab by mouth nightly. 90 Tab 1   ??? clonazePAM (KLONOPIN) 0.5 mg tablet Take  by mouth nightly as needed.       Family History   Problem Relation Age of Onset   ??? Diabetes Father      with amputation   ??? Diabetes Maternal Grandmother      with blindness   ??? Diabetes Brother      Allergies   Allergen Reactions   ??? Contrast Agent [Iodine] Rash   ??? Ibuprofen Other (comments)     GI upset   ??? Lipitor [Atorvastatin] Myalgia   ??? Zetia [Ezetimibe] Other (comments)     Had trouble with his eyes that improved upon stopping this med       History   Substance Use Topics   ??? Smoking status: Former Smoker     Quit date: 07/20/1962   ??? Smokeless tobacco: Not on file   ??? Alcohol Use: No        Review of systems as per the above and previous documentation      Objective:     Wt Readings from Last 3 Encounters:   01/09/15 196 lb 9.6 oz (89.177 kg)   10/10/14 189 lb (85.73 kg)   07/20/12 188 lb 9.6 oz (85.548 kg)     Temp Readings from Last 3 Encounters:    01/09/15 97.2 ??F (36.2 ??C) Temporal   10/10/14 97.6 ??F (36.4 ??C) Temporal     BP Readings from Last 3 Encounters:   01/09/15 164/72   10/10/14 142/70   07/20/12 134/60     Pulse Readings from Last 3 Encounters:   01/09/15 57   10/10/14 68   07/20/12 56     Visit Vitals   Item Reading   ??? BP 164/72 mmHg   ??? Pulse 57   ??? Temp(Src) 97.2 ??F (36.2 ??C) (Temporal)   ??? Resp 16   ??? Ht 5' 7.5" (1.715 m)   ??? Wt 196 lb 9.6 oz (89.177 kg)   ??? BMI 30.32 kg/m2   ??? SpO2 98%     Alert and oriented.  No acute distress.  HEENT Ears TMs are normal.  Canals are clear.  Eyes pupils equal, round, react to light and accommodation.  Extraocular movements intact.  Nose patent.  Mouth and throat is clear.  Neck supple full range of motion no thyromegaly.  No carotid bruits.  No significant lymphadenopathy  Lungs clear to auscultation without wheezes, rales, or rhonchi.  Heart  RRR,  S1 & S2 are normal intensity. No murmur; no gallop  Abdomen bowel sounds active. Tenderness left lower quadrant to palpation no rebound or guarding no masses no organomegaly  Back no CVA tenderness.  GU normal male external genitalia no hernia no rupture  Extremities without clubbing, cyanosis, or edema  Neuro HMF intact.  Cranial nerves II through XII grossly normal.  Motor is 5 over 5 and symmetrical.                Assessment / Plan:       ICD-10-CM ICD-9-CM    1. Left lower quadrant pain R10.32 789.04 CBC WITH AUTOMATED DIFF   2. Diverticulosis of large intestine without hemorrhage K57.30 562.10    3. Type 2 diabetes mellitus with hyperglycemia (HCC) E11.65 250.00 CBC WITH AUTOMATED DIFF      HEMOGLOBIN A1C   4. Pure  hypercholesterolemia E78.0 272.0    5. Essential hypertension I10 401.9    6. Diverticulitis of large intestine without perforation or abscess without bleeding K57.32 562.11              Plan  Orders Placed This Encounter   ??? CBC WITH AUTOMATED DIFF   ??? HEMOGLOBIN A1C   ??? HYDROcodone-acetaminophen (NORCO) 5-325 mg per tablet      Sig: Take 1 Tab by mouth every four (4) hours as needed for Pain.   ??? amoxicillin-clavulanate (AUGMENTIN) 875-125 mg per tablet     Sig: Take 1 Tab by mouth every twelve (12) hours for 10 days.     Dispense:  20 Tab     Refill:  0   suspect diverticulitis etiology for the left lower quadrant pain we'll repeat CBC trial on Augmentin  Follow up if symptoms worsen, change, fail to improve or any new symptoms develop. Phone follow up next few days  Has follow up with his urologist tomorrow for recheck          (Please note that portions of this note were completed with a voice recognition program. Efforts were made to edit the dictations but occasionally words are mis-transcribed.)

## 2015-01-10 LAB — CBC WITH AUTOMATED DIFF
ABS. BASOPHILS: 0 10*3/uL (ref 0.0–0.2)
ABS. EOSINOPHILS: 0.1 10*3/uL (ref 0.0–0.4)
ABS. IMM. GRANS.: 0 10*3/uL (ref 0.0–0.1)
ABS. MONOCYTES: 0.7 10*3/uL (ref 0.1–0.9)
ABS. NEUTROPHILS: 6.7 10*3/uL (ref 1.4–7.0)
Abs Lymphocytes: 1.2 10*3/uL (ref 0.7–3.1)
BASOPHILS: 0 %
EOSINOPHILS: 1 %
HCT: 41.6 % (ref 37.5–51.0)
HGB: 13.9 g/dL (ref 12.6–17.7)
IMMATURE GRANULOCYTES: 0 %
Lymphocytes: 14 %
MCH: 28 pg (ref 26.6–33.0)
MCHC: 33.4 g/dL (ref 31.5–35.7)
MCV: 84 fL (ref 79–97)
MONOCYTES: 8 %
NEUTROPHILS: 77 %
PLATELET: 188 10*3/uL (ref 150–379)
RBC: 4.96 x10E6/uL (ref 4.14–5.80)
RDW: 14.8 % (ref 12.3–15.4)
WBC: 8.8 10*3/uL (ref 3.4–10.8)

## 2015-01-10 LAB — HEMOGLOBIN A1C W/O EAG: Hemoglobin A1c: 8 % — ABNORMAL HIGH (ref 4.8–5.6)

## 2015-01-10 NOTE — Progress Notes (Signed)
Quick Note:        Patient has been advised of lab results and med changes per Dr Ralph Leydenoyle.    ______

## 2015-01-10 NOTE — Progress Notes (Signed)
Quick Note:        CBC is normal, hemoglobin A1c 8.0 which is better but would like to get down to the 7's, increase Lantus to 55 units a day continue other medications watch diet    ______

## 2015-01-25 ENCOUNTER — Encounter

## 2015-01-25 ENCOUNTER — Ambulatory Visit: Admit: 2015-01-25 | Discharge: 2015-01-25 | Payer: MEDICARE | Attending: Family Medicine | Primary: Family Medicine

## 2015-01-25 DIAGNOSIS — E1165 Type 2 diabetes mellitus with hyperglycemia: Secondary | ICD-10-CM

## 2015-01-25 NOTE — Progress Notes (Signed)
Subjective:  Terry Hamilton presents for follow-up of abdominal pain. Please see previous note. Continues to have some nausea crampy abdominal pain diffuse discomfort. Appetite is good bowel movements are normal for him no melena no hematochezia. No vomiting. Weights been stable. Had workup in the ER including CT scan and blood work CT was unremarkable as was work. He wonders if it could be medication related had difficulty with metformin in the past and has had abdominal discomfort since restart of metformin  Continues to have hip pain bilaterally left greater than the right has a history of prostate cancer had followup with his urologist last week and will see him again next week he did order a bone scan which was done yesterday results are unknown.  No fever or chills no urinary tract symptoms. It does have a history of diverticular disease was started on Augmentin last visit completed the course no improvement. Last colonoscopy 2014 unremarkable except for diverticuli      Problem list reviewed as part of this encounter.     Past Medical History   Diagnosis Date   ??? IBS (irritable bowel syndrome)    ??? Diverticulosis      with diverticulitis   ??? Other and unspecified hyperlipidemia    ??? Type II or unspecified type diabetes mellitus without mention of complication, uncontrolled Age 81   ??? CAD (coronary artery disease)      s/p CABG, Dr Donata DuffHalloway   ??? GERD (gastroesophageal reflux disease)    ??? Prostate cancer (HCC)      FU Dr Jeannine KittenHaskins   ??? Unspecified essential hypertension    ??? Abdominal pain      chronic epigastric, 6-7 years      Medication list reviewed and updated.   Review of Systems -as per the above and previous documentation  Objective:  Visit Vitals   Item Reading   ??? BP 130/60 mmHg   ??? Pulse 66   ??? Temp(Src) 97.8 ??F (36.6 ??C) (Oral)   ??? Resp 20   ??? Ht 5' 7.5" (1.715 m)   ??? Wt 195 lb (88.451 kg)   ??? BMI 30.07 kg/m2   ??? SpO2 97%     Alert and oriented.  No acute distress.   HEENT Ears TMs are normal.  Canals are clear.  Eyes pupils equal, round, react to light and accommodation.  Extraocular movements intact.  Nose patent.  Mouth and throat is clear.  Neck supple full range of motion no thyromegaly.  No carotid bruits.  No significant lymphadenopathy  Lungs clear to auscultation without wheezes, rales, or rhonchi.  Heart  RRR,  S1 & S2 are normal intensity. No murmur; no gallop  Abdomen bowel sounds active.  Diffuse mild tenderness, no guarding, rebound, masses, organomegaly. Bowel sounds active all, moves easily changes position easily  Back no CVA tenderness.  Extremities without clubbing, cyanosis, or edema, pain in both hips with change in position early ambulation  Pulses radial and femoral pulses are normal  Neuro HMF intact.  Cranial nerves II through XII grossly normal.  Motor is 5 over 5 and symmetrical.  Deep tendon reflexes +2 equal  Results for orders placed or performed in visit on 01/09/15   CBC WITH AUTOMATED DIFF   Result Value Ref Range    WBC 8.8 3.4 - 10.8 x10E3/uL    RBC 4.96 4.14 - 5.80 x10E6/uL    HGB 13.9 12.6 - 17.7 g/dL    HCT 16.141.6 09.637.5 - 04.551.0 %  MCV 84 79 - 97 fL    MCH 28.0 26.6 - 33.0 pg    MCHC 33.4 31.5 - 35.7 g/dL    RDW 82.9 56.2 - 13.0 %    PLATELET 188 150 - 379 x10E3/uL    NEUTROPHILS 77 %    Lymphocytes 14 %    MONOCYTES 8 %    EOSINOPHILS 1 %    BASOPHILS 0 %    ABS. NEUTROPHILS 6.7 1.4 - 7.0 x10E3/uL    Abs Lymphocytes 1.2 0.7 - 3.1 x10E3/uL    ABS. MONOCYTES 0.7 0.1 - 0.9 x10E3/uL    ABS. EOSINOPHILS 0.1 0.0 - 0.4 x10E3/uL    ABS. BASOPHILS 0.0 0.0 - 0.2 x10E3/uL    IMMATURE GRANULOCYTES 0 %    ABS. IMM. GRANS. 0.0 0.0 - 0.1 x10E3/uL   HEMOGLOBIN A1C W/O EAGSTIMATION   Result Value Ref Range    Hemoglobin A1c 8.0 (H) 4.8 - 5.6 %         Assessment:  Encounter Diagnoses   Name Primary?   ??? Type 2 diabetes mellitus with hyperglycemia (HCC) Yes   ??? Pure hypercholesterolemia    ??? Essential hypertension with goal blood pressure less than 140/90     ??? Pain of both hip joints          Abdominal pain etiology uncertain possibly medication related, no acute abdomen        Plan:  See orders.  Orders Placed This Encounter   ??? XR HIP RT W OR WO PELV 2-3 VWS     Standing Status: Future      Number of Occurrences:       Standing Expiration Date: 02/24/2016     Order Specific Question:  Reason for Exam     Answer:  hip pain hx of prostate ca     Order Specific Question:  Is Patient Allergic to Contrast Dye?     Answer:  Unknown   ??? XR HIP LT W OR WO PELV 2-3 VWS     Standing Status: Future      Number of Occurrences:       Standing Expiration Date: 02/24/2016     Order Specific Question:  Reason for Exam     Answer:  hip pain hx prostate CA     Order Specific Question:  Is Patient Allergic to Contrast Dye?     Answer:  Unknown   we'll DC metformin continue other medications continue to monitor blood sugars which have been excellent as of late and the 110 range  We'll x-ray both hips followup with urology as planned  Follow up if symptoms worsen, change, fail to improve or any new symptoms develop.          There are no Patient Instructions on file for this visit.    (Please note that portions of this note were completed with a voice recognition program. Efforts were made to edit the dictations but occasionally words are mis-transcribed.)

## 2015-01-25 NOTE — Progress Notes (Signed)
Quick Note:        Mild degenerative joint disease of the hips, let's wait and see what the bone scan shows    ______

## 2015-01-26 NOTE — Progress Notes (Signed)
Quick Note:        PC / SW / given Xray results and instructions. Patient had a question in reference to Dr. Ralph Leydenoyle stopping his metformin whether he is just to continue to monitor it himself like he has been doing and to just give Dr. Ralph Leydenoyle a call if he has a reading that is very out of range high or low? His next appointment is not until 05/15/15 at 8:30.    ______

## 2015-01-27 NOTE — Progress Notes (Signed)
Quick Note:        Yes cont to monitor and let me know if consistently high ie greater than 150 fasting    ______

## 2015-01-29 NOTE — Progress Notes (Signed)
Quick Note:        Called left message.    ______

## 2015-01-30 ENCOUNTER — Ambulatory Visit: Admit: 2015-01-30 | Discharge: 2015-01-30 | Payer: MEDICARE | Attending: Family Medicine | Primary: Family Medicine

## 2015-01-30 DIAGNOSIS — E1165 Type 2 diabetes mellitus with hyperglycemia: Secondary | ICD-10-CM

## 2015-01-30 MED ORDER — MELOXICAM 7.5 MG TAB
7.5 mg | ORAL_TABLET | Freq: Every day | ORAL | Status: DC
Start: 2015-01-30 — End: 2015-11-14

## 2015-01-30 MED ORDER — INSULIN GLARGINE 100 UNIT/ML (3 ML) SUB-Q PEN
100 unit/mL (3 mL) | Freq: Two times a day (BID) | SUBCUTANEOUS | Status: DC
Start: 2015-01-30 — End: 2015-05-15

## 2015-01-30 NOTE — Patient Instructions (Signed)
Medicare Wellness Visit, Male    The best way to live healthy is to have a healthy lifestyle by eating a well-balanced diet, exercising regularly, limiting alcohol and stopping smoking.    Regular physical exams and screening tests are another way to keep healthy.   Preventive exams provided by your health care provider can find health problems before they become diseases or illnesses. Preventive services including immunizations, screening tests, monitoring and exams can help you take care of your own health.    All people over age 79 should have a pneumovax  and and a prevnar shot to prevent pneumonia. These are once in a lifetime unless you and your provider decide differently.    All people over 65 should have a yearly flu shot and a tetanus vaccine every 10 years.    Screening for diabetes mellitus with a blood sugar test should be done every year.    Glaucoma is a disease of the eye due to increased ocular pressure that can lead to blindness and it should be done every year by an eye professional.    Cardiovascular screening tests that check for elevated lipids (fatty part of blood) which can lead to heart disease and strokes should be done every 5 years.    Colorectal screening that evaluates for blood or polyps in your colon should be done yearly as a stool test or every five years as a flexible sigmoidoscope or every 10 years as a colonoscopy up to age 79.    Men up to age 46 may need a screening blood test for prostate cancer at certain intervals, depending on their personal and family history. This decision is between the patient and his provider.    If you have been a smoker or had family history of abdominal aortic aneurysms, you and your provider may decide to schedule an ultrasound test of your aorta.    Hepatitis C screening is also recommended for anyone born between [id number] through 1965.    A shingles vaccine is also recommended once in a lifetime after age 79.     Your Medicare Wellness Exam is recommended annually.    Here is a list of your current Health Maintenance items with a due date:  Health Maintenance Due   Topic Date Due   ??? Diabetic Foot Care  03/01/1943   ??? Albumin Urine Test  03[birth year]01/1945   ??? Tdap Vaccine  03/01/1954   ??? Td Vaccine  03/01/1954   ??? Shingles Vaccine  03/02/1995   ??? Pneumonia Vaccine  03/01/2000   ??? Annual Well Visit  03/01/2000

## 2015-01-30 NOTE — Progress Notes (Signed)
This is an Initial Medicare Annual Wellness Exam (AWV) (Performed 12 months after IPPE or effective date of Medicare Part B enrollment, Once in a lifetime)    I have reviewed the patient's medical history in detail and updated the computerized patient record.     History   In for followup abdominal pain diabetes. Metformin was recently DC'd his GI complaints have resolved completely. Blood sugars become a little more erratic has awoken 2 to 3 AM with blood sugar 221 night and 73 another night. Fasting this continued to run 100+ or -10.  No severe hypoglycemic episode but he is concerned about the fluctuation  Otherwise doing well no interval problems.  No chest pain, no angina no shortness of breath. No orthopnea or PND. No dependent edema.  No abdominal pain, change in bowel habits, no blood in stool or black stools.  No urinary frequency, urgency, dysuria. No change in voiding pattern or stream, no significant nocturia.  No problems with medications and is compliant.    Past Medical History   Diagnosis Date   ??? IBS (irritable bowel syndrome)    ??? Diverticulosis      with diverticulitis   ??? Other and unspecified hyperlipidemia    ??? Type II or unspecified type diabetes mellitus without mention of complication, uncontrolled Age 44   ??? CAD (coronary artery disease)      s/p CABG, Dr Donata Duff   ??? GERD (gastroesophageal reflux disease)    ??? Prostate cancer (HCC)      FU Dr Jeannine Kitten   ??? Unspecified essential hypertension    ??? Abdominal pain      chronic epigastric, 6-7 years      Past Surgical History   Procedure Laterality Date   ??? Hx coronary artery bypass graft  2008   ??? Hx hernia repair       bilateral   ??? Hx appendectomy     ??? Hx cholecystectomy     ??? Hx laparotomy       for diverticulitis x3 with part of colon removed, wore colostomy for a shot period   ??? Hx prostatectomy     ??? Hx cataract removal       bilateral   ??? Hx colonoscopy  2014     tics, polyp     Current Outpatient Prescriptions    Medication Sig Dispense Refill   ??? insulin glargine (LANTUS SOLOSTAR) 100 unit/mL (3 mL) pen 25 Units by SubCUTAneous route two (2) times a day. 3 Each 3   ??? meloxicam (MOBIC) 7.5 mg tablet Take 1 Tab by mouth daily. As needed for hip pain 30 Tab 3   ??? sitaGLIPtin (JANUVIA) 100 mg tablet Take 1 Tab by mouth daily. 30 Tab 5   ??? lubiPROStone (AMITIZA) 8 mcg capsule Take 1 Cap by mouth daily (with breakfast). prn 30 Cap 3   ??? flavoxATE (URISPAS) 100 mg tablet Take 1 Tab by mouth two (2) times a day. 180 Tab 1   ??? furosemide (LASIX) 20 mg tablet Take 1 Tab by mouth daily. 90 Tab 1   ??? metoprolol (LOPRESSOR) 50 mg tablet Take 1 Tab by mouth two (2) times a day. 180 Tab 1   ??? omeprazole (PRILOSEC) 20 mg capsule Take 1 Cap by mouth daily. 90 Cap 1   ??? ramipril (ALTACE) 10 mg capsule Take 1 Cap by mouth daily. 90 Cap 1   ??? simvastatin (ZOCOR) 20 mg tablet Take 1 Tab by mouth nightly. 90 Tab  1   ??? clonazePAM (KLONOPIN) 0.5 mg tablet Take  by mouth nightly as needed.     ??? HYDROcodone-acetaminophen (NORCO) 5-325 mg per tablet Take 1 Tab by mouth every four (4) hours as needed for Pain.       Allergies   Allergen Reactions   ??? Contrast Agent [Iodine] Rash   ??? Ibuprofen Other (comments)     GI upset   ??? Lipitor [Atorvastatin] Myalgia   ??? Zetia [Ezetimibe] Other (comments)     Had trouble with his eyes that improved upon stopping this med     Family History   Problem Relation Age of Onset   ??? Diabetes Father      with amputation   ??? Diabetes Maternal Grandmother      with blindness   ??? Diabetes Brother      History   Substance Use Topics   ??? Smoking status: Former Smoker     Quit date: 07/20/1962   ??? Smokeless tobacco: Not on file   ??? Alcohol Use: No     Patient Active Problem List   Diagnosis Code   ??? Hyperlipemia E78.5   ??? Essential hypertension I10   ??? DM (diabetes mellitus) (HCC) E11.9         Depression Risk Factor Screening:     PHQ 2 / 9, over the last two weeks 10/10/2014    Little interest or pleasure in doing things Not at all   Feeling down, depressed or hopeless Not at all   Total Score PHQ 2 0     Alcohol Risk Factor Screening:   On any occasion during the past 3 months, have you had more than 4 drinks containing alcohol?  No    Do you average more than 14 drinks per week?  No    Functional Ability and Level of Safety:     Hearing Loss   mild    Activities of Daily Living   Self-care.   Requires assistance with: no ADLs    Fall Risk     Fall Risk Assessment, last 12 mths 10/10/2014   Able to walk? Yes   Fall in past 12 months? No     Abuse Screen   Patient is not abused    Review of Systems   A comprehensive review of systems was negative except for that written in the HPI.    Physical Examination     No exam data present    Evaluation of Cognitive Function:  Mood/affect:  neutral  Appearance: age appropriate  Family member/caregiver input: NA    Visit Vitals   Item Reading   ??? BP 120/62 mmHg   ??? Pulse 72   ??? Temp(Src) 98.7 ??F (37.1 ??C) (Oral)   ??? Ht 5' 7.5" (1.715 m)   ??? Wt 196 lb (88.905 kg)   ??? BMI 30.23 kg/m2   ??? SpO2 96%     Alert and oriented.  No acute distress.  HEENT Ears TMs are normal.  Canals are clear.  Eyes pupils equal, round, react to light and accommodation.  Extraocular movements intact.  Nose patent.  Mouth and throat is clear.  Neck supple full range of motion no thyromegaly.  No carotid bruits.  No significant lymphadenopathy  Lungs clear to auscultation without wheezes, rales, or rhonchi.  Heart  RRR,  S1 & S2 are normal intensity. No murmur; no gallop  Abdomen bowel sounds active.  No tenderness, guarding, rebound, masses, organomegaly.  Back no CVA  tenderness.  Extremities without clubbing, cyanosis, or edema  Neuro HMF intact.  Cranial nerves II through XII grossly normal.  Motor is 5 over 5 and symmetrical.  Deep tendon reflexes +2 equal          Patient Care Team:  Shawnie Pons, MD as PCP - General [facility])    Advice/Referrals/Counseling    Education and counseling provided:  Are appropriate based on today's review and evaluation    Assessment/Plan       ICD-10-CM ICD-9-CM    1. Type 2 diabetes mellitus with hyperglycemia (HCC) E11.65 250.00    2. Pure hypercholesterolemia E78.0 272.0    3. Essential hypertension with goal blood pressure less than 140/90 I10 401.9    4. Routine general medical examination at a health care facility Z00.00 V70.0    5. Screening for alcoholism Z13.89 V79.1    .  Split Lantus 25 units b.i.d. Subcutaneous h.s. snack continue to monitor blood sugar bring in for review.  Follow up if symptoms worsen, change, fail to improve or any new symptoms develop.  Continues follow up with urology

## 2015-02-01 NOTE — Progress Notes (Signed)
Quick Note:        Patient has been advised per Dr Doyle.    ______

## 2015-02-01 NOTE — Progress Notes (Signed)
Quick Note:        Called left message.    ______

## 2015-05-15 ENCOUNTER — Ambulatory Visit: Admit: 2015-05-15 | Discharge: 2015-05-15 | Payer: MEDICARE | Attending: Family Medicine | Primary: Family Medicine

## 2015-05-15 ENCOUNTER — Encounter: Attending: Family Medicine | Primary: Family Medicine

## 2015-05-15 DIAGNOSIS — K5900 Constipation, unspecified: Secondary | ICD-10-CM

## 2015-05-15 MED ORDER — BLOOD SUGAR DIAGNOSTIC TEST STRIPS
ORAL_STRIP | 3 refills | Status: DC
Start: 2015-05-15 — End: 2015-07-24

## 2015-05-15 MED ORDER — METOPROLOL TARTRATE 50 MG TAB
50 mg | ORAL_TABLET | Freq: Two times a day (BID) | ORAL | 1 refills | Status: DC
Start: 2015-05-15 — End: 2015-10-01

## 2015-05-15 MED ORDER — LUBIPROSTONE 8 MCG CAP
8 mcg | ORAL_CAPSULE | Freq: Every day | ORAL | 5 refills | Status: DC
Start: 2015-05-15 — End: 2015-10-17

## 2015-05-15 MED ORDER — FLAVOXATE 100 MG TAB
100 mg | ORAL_TABLET | Freq: Two times a day (BID) | ORAL | 1 refills | Status: DC
Start: 2015-05-15 — End: 2015-06-12

## 2015-05-15 MED ORDER — RAMIPRIL 10 MG CAP
10 mg | ORAL_CAPSULE | Freq: Every day | ORAL | 1 refills | Status: DC
Start: 2015-05-15 — End: 2015-10-08

## 2015-05-15 MED ORDER — INSULIN GLARGINE 100 UNIT/ML (3 ML) SUB-Q PEN
100 unit/mL (3 mL) | Freq: Two times a day (BID) | SUBCUTANEOUS | 3 refills | Status: DC
Start: 2015-05-15 — End: 2015-11-15

## 2015-05-15 MED ORDER — OMEPRAZOLE 20 MG CAP, DELAYED RELEASE
20 mg | ORAL_CAPSULE | Freq: Every day | ORAL | 1 refills | Status: DC
Start: 2015-05-15 — End: 2015-10-01

## 2015-05-15 MED ORDER — SITAGLIPTIN 100 MG TAB
100 mg | ORAL_TABLET | Freq: Every day | ORAL | 5 refills | Status: DC
Start: 2015-05-15 — End: 2016-01-11

## 2015-05-15 MED ORDER — DOCUSATE SODIUM 100 MG CAP
100 mg | ORAL_CAPSULE | Freq: Two times a day (BID) | ORAL | 5 refills | Status: AC
Start: 2015-05-15 — End: ?

## 2015-05-15 NOTE — Progress Notes (Signed)
Subjective:  Terry Hamilton presents for follow-up of chronic constipation, continues to have difficulty having to use a stimulant every 3 days or so. Not using fiber on a regular basis encouraged to do so also not using a stool softener which we will prescribe today. Denies any localized abdominal pain no nausea or vomiting. No melena or hematochezia no mucus in the stool  Blood sugars are improved 120+ or -20 will recheck lab today  Otherwise doing well no interval problems.  No chest pain, no angina no shortness of breath. No orthopnea or PND. No dependent edema.   No urinary frequency, urgency, dysuria. Having urinary tract issues post prostatectomy continues to follow up with his urologist  No problems with medications and is compliant.        Problem list reviewed as part of this encounter.     Past Medical History   Diagnosis Date   ??? Abdominal pain      chronic epigastric, 6-7 years   ??? CAD (coronary artery disease)      s/p CABG, Dr Donata Duff   ??? Diverticulosis      with diverticulitis   ??? GERD (gastroesophageal reflux disease)    ??? IBS (irritable bowel syndrome)    ??? Other and unspecified hyperlipidemia    ??? Prostate cancer (HCC)      FU Dr Jeannine Kitten   ??? Type II or unspecified type diabetes mellitus without mention of complication, uncontrolled Age 81   ??? Unspecified essential hypertension       Medication list reviewed and updated.   Review of Systems - per the above and previous documentation    Objective:  Visit Vitals   ??? BP 128/70 (BP 1 Location: Right arm, BP Patient Position: Sitting)   ??? Pulse 64   ??? Temp 98.3 ??F (36.8 ??C) (Oral)   ??? Resp 20   ??? Ht 5' 7.5" (1.715 m)   ??? Wt 192 lb (87.1 kg)   ??? SpO2 99%   ??? BMI 29.63 kg/m2     Alert and oriented.  No acute distress.  HEENT  Eyes pupils equal, round, react to light and accommodation.  Extraocular movements intact.  Nose patent.  Mouth and throat is clear.  Neck supple full range of motion no thyromegaly.  No carotid bruits.  No  significant lymphadenopathy  Lungs clear to auscultation without wheezes, rales, or rhonchi.  Heart  RRR,  S1 & S2 are normal intensity. No murmur; no gallop  Abdomen bowel sounds active.  No tenderness, guarding, rebound, masses, organomegaly.  Back no CVA tenderness.  Extremities without clubbing, cyanosis, or edema  Neuro HMF intact.    Results for orders placed or performed in visit on 01/09/15   CBC WITH AUTOMATED DIFF   Result Value Ref Range    WBC 8.8 3.4 - 10.8 x10E3/uL    RBC 4.96 4.14 - 5.80 x10E6/uL    HGB 13.9 12.6 - 17.7 g/dL    HCT 16.1 09.6 - 04.5 %    MCV 84 79 - 97 fL    MCH 28.0 26.6 - 33.0 pg    MCHC 33.4 31.5 - 35.7 g/dL    RDW 40.9 81.1 - 91.4 %    PLATELET 188 150 - 379 x10E3/uL    NEUTROPHILS 77 %    Lymphocytes 14 %    MONOCYTES 8 %    EOSINOPHILS 1 %    BASOPHILS 0 %    ABS. NEUTROPHILS 6.7 1.4 - 7.0 x10E3/uL  Abs Lymphocytes 1.2 0.7 - 3.1 x10E3/uL    ABS. MONOCYTES 0.7 0.1 - 0.9 x10E3/uL    ABS. EOSINOPHILS 0.1 0.0 - 0.4 x10E3/uL    ABS. BASOPHILS 0.0 0.0 - 0.2 x10E3/uL    IMMATURE GRANULOCYTES 0 %    ABS. IMM. GRANS. 0.0 0.0 - 0.1 x10E3/uL   HEMOGLOBIN A1C W/O EAGSTIMATION   Result Value Ref Range    Hemoglobin A1c 8.0 (H) 4.8 - 5.6 %         Assessment:    ICD-10-CM ICD-9-CM    1. Constipation, unspecified constipation type K59.00 564.00 COLLECTION VENOUS BLOOD,VENIPUNCTURE   2. Type 2 diabetes mellitus with hyperglycemia (HCC) E11.65 250.00 LIPID PANEL      HEMOGLOBIN A1C WITH EAG      METABOLIC PANEL, COMPREHENSIVE      COLLECTION VENOUS BLOOD,VENIPUNCTURE   3. Pure hypercholesterolemia E78.0 272.0 LIPID PANEL      COLLECTION VENOUS BLOOD,VENIPUNCTURE   4. Essential hypertension with goal blood pressure less than 140/90 I10 401.9 COLLECTION VENOUS BLOOD,VENIPUNCTURE             Plan:  See orders.  Orders Placed This Encounter   ??? COLLECTION VENOUS BLOOD,VENIPUNCTURE   ??? LIPID PANEL   ??? HEMOGLOBIN A1C WITH EAG   ??? METABOLIC PANEL, COMPREHENSIVE    ??? sitaGLIPtin (JANUVIA) 100 mg tablet     Sig: Take 1 Tab by mouth daily.     Dispense:  30 Tab     Refill:  5   ??? lubiPROStone (AMITIZA) 8 mcg capsule     Sig: Take 1 Cap by mouth daily (with breakfast). prn     Dispense:  30 Cap     Refill:  5   ??? docusate sodium (COLACE) 100 mg capsule     Sig: Take 1 Cap by mouth two (2) times a day.     Dispense:  60 Cap     Refill:  5   ??? insulin glargine (LANTUS SOLOSTAR) 100 unit/mL (3 mL) pen     Sig: 25 Units by SubCUTAneous route two (2) times a day.     Dispense:  3 Each     Refill:  3   ??? metoprolol tartrate (LOPRESSOR) 50 mg tablet     Sig: Take 1 Tab by mouth two (2) times a day.     Dispense:  180 Tab     Refill:  1   ??? omeprazole (PRILOSEC) 20 mg capsule     Sig: Take 1 Cap by mouth daily.     Dispense:  90 Cap     Refill:  1   ??? ramipril (ALTACE) 10 mg capsule     Sig: Take 1 Cap by mouth daily.     Dispense:  90 Cap     Refill:  1   ??? flavoxATE (URISPAS) 100 mg tablet     Sig: Take 1 Tab by mouth two (2) times a day.     Dispense:  180 Tab     Refill:  1   ??? glucose blood VI test strips (ACCU-CHEK SMARTVIEW TEST STRIP) strip     Sig: Use as directed     Dispense:  100 Strip     Refill:  3     Fasting blood work medications as listed followup 3 months  Follow up sooner if symptoms worsen, change, fail to improve or any new symptoms develop.  Followup with specialist as they direct      There are  no Patient Instructions on file for this visit.    (Please note that portions of this note were completed with a voice recognition program. Efforts were made to edit the dictations but occasionally words are mis-transcribed.)

## 2015-05-16 LAB — METABOLIC PANEL, COMPREHENSIVE
A-G Ratio: 1.5 (ref 1.1–2.5)
ALT (SGPT): 13 IU/L (ref 0–44)
AST (SGOT): 20 IU/L (ref 0–40)
Albumin: 3.9 g/dL (ref 3.5–4.7)
Alk. phosphatase: 69 IU/L (ref 39–117)
BUN/Creatinine ratio: 13 (ref 10–22)
BUN: 15 mg/dL (ref 8–27)
Bilirubin, total: 0.3 mg/dL (ref 0.0–1.2)
CO2: 21 mmol/L (ref 18–29)
Calcium: 9.3 mg/dL (ref 8.6–10.2)
Chloride: 105 mmol/L (ref 97–108)
Creatinine: 1.14 mg/dL (ref 0.76–1.27)
GFR est AA: 70 mL/min/{1.73_m2} (ref >59)
GFR est non-AA: 60 mL/min/{1.73_m2} (ref >59)
GLOBULIN, TOTAL: 2.6 g/dL (ref 1.5–4.5)
Glucose: 121 mg/dL — ABNORMAL HIGH (ref 65–99)
Potassium: 4.8 mmol/L (ref 3.5–5.2)
Protein, total: 6.5 g/dL (ref 6.0–8.5)
Sodium: 142 mmol/L (ref 134–144)

## 2015-05-16 LAB — LIPID PANEL
Cholesterol, total: 156 mg/dL (ref 100–199)
HDL Cholesterol: 48 mg/dL (ref >39)
LDL, calculated: 93 mg/dL (ref 0–99)
Triglyceride: 76 mg/dL (ref 0–149)
VLDL, calculated: 15 mg/dL (ref 5–40)

## 2015-05-16 LAB — HEMOGLOBIN A1C WITH EAG
Estimated average glucose: 192 mg/dL
Hemoglobin A1c: 8.3 % — ABNORMAL HIGH (ref 4.8–5.6)

## 2015-05-16 NOTE — Progress Notes (Signed)
Lipid profile is good hemoglobin A1c stable at 8.3, metabolic panel normal except for glucose of 121, continue same medications watch diet especially sweets in the diet

## 2015-05-17 NOTE — Progress Notes (Signed)
Mailed lab results to patient per Dr. Harry Doyle

## 2015-05-18 MED ORDER — SIMVASTATIN 20 MG TAB
20 mg | ORAL_TABLET | ORAL | 1 refills | Status: DC
Start: 2015-05-18 — End: 2015-10-01

## 2015-05-18 MED ORDER — FUROSEMIDE 20 MG TAB
20 mg | ORAL_TABLET | ORAL | 1 refills | Status: DC
Start: 2015-05-18 — End: 2015-10-01

## 2015-05-22 NOTE — Telephone Encounter (Signed)
Call pharmacy and they will tell pt results of question.

## 2015-05-22 NOTE — Telephone Encounter (Signed)
Would just hold on filling oxybutynin until patient can discuss with Dr. Jeannine KittenHaskins

## 2015-05-22 NOTE — Telephone Encounter (Signed)
[facility] pharmacy called and pt was there wanting to fill new script oxybutynin from Dr. Jeannine Kitten.  He is also on flavoxate. Their computer will not allow oxybutynin to be filled. Pharmacy was unable to get in touch with Dr. Jeannine Kitten office. Please advise.

## 2015-06-05 NOTE — Telephone Encounter (Signed)
Would like to try off this med secondary to side affects

## 2015-06-05 NOTE — Telephone Encounter (Signed)
Refill Clonazepam to [facility] pharmacy

## 2015-06-06 NOTE — Telephone Encounter (Signed)
Would prefer he not use this med increased risk of falls

## 2015-06-06 NOTE — Telephone Encounter (Signed)
Spoke with patient.  He states he only takes med prn and is requesting refill.  I have advised patient that I will advised Dr Enid Cutter that Rx may not be refilled per Dr Ralph Leyden instruction's.

## 2015-06-06 NOTE — Telephone Encounter (Signed)
Patient already advised.

## 2015-06-12 ENCOUNTER — Ambulatory Visit: Admit: 2015-06-12 | Discharge: 2015-06-12 | Payer: MEDICARE | Attending: Family Medicine | Primary: Family Medicine

## 2015-06-12 DIAGNOSIS — H8193 Unspecified disorder of vestibular function, bilateral: Secondary | ICD-10-CM

## 2015-06-12 MED ORDER — MECLIZINE 25 MG TAB
25 mg | ORAL_TABLET | Freq: Three times a day (TID) | ORAL | 1 refills | Status: AC | PRN
Start: 2015-06-12 — End: 2015-06-22

## 2015-06-12 NOTE — Progress Notes (Signed)
Subjective:  Terry Hamilton presents with complaint of dizziness onset Sunday fairly abrupt has persisted unchanged.  More of disequilibrium sometimes mild vertigo some nausea no vomiting.  No tinnitus no decreased hearing no falls or injuries no headache or neck pain.  Denies any double vision blurred vision difficulty with speech or swallowing paresthesia anesthesia or motor weakness.  Coordination seems okay.  Dizziness worse sometimes with change in position but often experienced at rest.  Appetite is fair fluid intake's good.  Continues to complain of chronic abdominal bloating mild upper abdomen discomfort chronic constipation he has had for last several years.  Up-to-date on colonoscopy.  Saw a GI specialist 1-2 years ago no diagnosis would like to see another gastroenterologist for evaluation.      Problem list reviewed as part of this encounter.     Past Medical History   Diagnosis Date   ??? Abdominal pain      chronic epigastric, 6-7 years   ??? CAD (coronary artery disease)      s/p CABG, Dr Donata Duff   ??? Diverticulosis      with diverticulitis   ??? GERD (gastroesophageal reflux disease)    ??? IBS (irritable bowel syndrome)    ??? Other and unspecified hyperlipidemia    ??? Prostate cancer (HCC)      FU Dr Jeannine Kitten   ??? Type II or unspecified type diabetes mellitus without mention of complication, uncontrolled Age 81   ??? Unspecified essential hypertension       Medication list reviewed and updated.   Review of Systems -as per the above and previous documentation  Objective:  Visit Vitals   ??? BP 110/60 (BP 1 Location: Right arm, BP Patient Position: Sitting)   ??? Pulse 66   ??? Temp 97.9 ??F (36.6 ??C) (Oral)   ??? Resp 20   ??? Ht 5' 7.5" (1.715 m)   ??? Wt 192 lb 6.4 oz (87.3 kg)   ??? SpO2 99%   ??? BMI 29.69 kg/m2     Alert and oriented.  No acute distress.  HEENT Ears TMs are normal.  Canals are clear.  Eyes pupils equal, round, react to light and accommodation.  Extraocular movements intact.  Nose  patent.  Mouth and throat is clear.  Neck supple full range of motion no thyromegaly.  No carotid bruits.  No significant lymphadenopathy  Lungs clear to auscultation without wheezes, rales, or rhonchi.  Heart  RRR,  S1 & S2 are normal intensity. No murmur; no gallop  Abdomen bowel sounds active.  No tenderness, guarding, rebound, masses, organomegaly.  Back no CVA tenderness.  Extremities without clubbing, cyanosis, or edema  Neuro  HMF intact.  Cranial nerves II through XII grossly normal. Motor is 5/5 and equal. Deep tendon reflexes +2 and equal. Sensory intact to light touch and symmetric.  Finger to nose, finger to thumb, rapid alternating hand movements, heel to shin all normal. Romberg is mildly positive.  Gait is normal, wide spaced          Assessment:  Encounter Diagnoses   Name Primary?   ??? Type 2 diabetes mellitus with hyperglycemia (HCC)    ??? Pure hypercholesterolemia    ??? Essential hypertension with goal blood pressure less than 140/90    ??? Vestibular dizziness, bilateral Yes   ??? Chronic constipation, GERD, IBS            Plan:  See orders.  Orders Placed This Encounter   ??? REFERRAL TO GASTROENTEROLOGY  Referral Priority:   Routine     Referral Type:   Consultation     Referral Reason:   Specialty Services Required     Referral Location:   [facility]     Referred to Provider:   Herbert Moors., MD   ??? meclizine (ANTIVERT) 25 mg tablet     Sig: Take 1 Tab by mouth three (3) times daily as needed for up to 10 days. For dizziness  Indications: VERTIGO     Dispense:  30 Tab     Refill:  1    rest minimize change in position head turning medications as ordered  Follow up if symptoms worsen, change, fail to improve or any new symptoms develop.  Referral to gastroenterology for evaluation chronic problems        There are no Patient Instructions on file for this visit.    (Please note that portions of this note were completed with a voice  recognition program. Efforts were made to edit the dictations but occasionally words are mis-transcribed.)

## 2015-06-18 ENCOUNTER — Ambulatory Visit: Admit: 2015-06-18 | Discharge: 2015-06-18 | Payer: MEDICARE | Attending: Family Medicine | Primary: Family Medicine

## 2015-06-18 DIAGNOSIS — R42 Dizziness and giddiness: Secondary | ICD-10-CM

## 2015-06-18 MED ORDER — CLONAZEPAM 0.5 MG TAB
0.5 mg | ORAL_TABLET | Freq: Every evening | ORAL | 0 refills | Status: DC | PRN
Start: 2015-06-18 — End: 2015-10-08

## 2015-06-18 MED ORDER — CLONAZEPAM 0.5 MG TAB
0.5 mg | ORAL_TABLET | Freq: Every evening | ORAL | 0 refills | Status: DC | PRN
Start: 2015-06-18 — End: 2015-06-18

## 2015-06-18 NOTE — Progress Notes (Signed)
Subjective:  Terry Hamilton presents for follow-up of dizziness disequilibrium rare true vertigo.  Please see previous note has had for 2 weeks no improvement on meclizine.  Symptoms are unchanged.  Dizzy most of the time no exacerbation with movement or change in position.  Does note some chronic decreased hearing no tinnitus.  Does note some difficulty focusing with his vision on objects far away which is new.  He denies any headache he denies any paresthesia anesthesia or motor weakness no difficulty speech or swallowing no neck pain.  Does feel unsteady on his feet at times no falls or injuries  No prior history of disequilibrium dizziness or vertigo.  No change in medications.  No other neurologic complaints.  No chest pain shortness of breath or associated cardiac symptoms      Problem list reviewed as part of this encounter.     Past Medical History   Diagnosis Date   ??? Abdominal pain      chronic epigastric, 6-7 years   ??? CAD (coronary artery disease)      s/p CABG, Dr Stephanie Acre   ??? Diverticulosis      with diverticulitis   ??? GERD (gastroesophageal reflux disease)    ??? IBS (irritable bowel syndrome)    ??? Other and unspecified hyperlipidemia    ??? Prostate cancer (HCC)      FU Dr Leeanne Deed   ??? Type II or unspecified type diabetes mellitus without mention of complication, uncontrolled Age 81   ??? Unspecified essential hypertension       Medication list reviewed and updated.   Review of Systems -as per the above and previous documentation  Objective:  Visit Vitals   ??? BP 132/68 (BP 1 Location: Left arm, BP Patient Position: Sitting)   ??? Pulse 66   ??? Temp 97 ??F (36.1 ??C) (Temporal)   ??? Resp 24   ??? Ht 5' 7.5" (1.715 m)   ??? Wt 190 lb 9.6 oz (86.5 kg)   ??? BMI 29.41 kg/m2     Alert and oriented.  No acute distress.  HEENT Ears TMs are normal.  Canals are clear.  Eyes pupils equal, round, react to light and accommodation.  Extraocular movements intact.  Fundi are benign.  Nose patent.  Mouth and throat is clear.   Neck supple full range of motion no thyromegaly.  No carotid bruits.  No significant lymphadenopathy  Lungs clear to auscultation without wheezes, rales, or rhonchi.  Heart  RRR,  S1 & S2 are normal intensity. No murmur; no gallop  Abdomen bowel sounds active.  No tenderness, guarding, rebound, masses, organomegaly.  Back no CVA tenderness.  Extremities without clubbing, cyanosis, or edema  Neuro  HMF intact.  Cranial nerves II through XII grossly normal. Motor is 5/5 and equal. Deep tendon reflexes +2 and equal. Sensory intact to light touch and symmetric.  Finger to nose, finger to thumb, rapid alternating hand movements and heel to shin all normal. Romberg is slightly unsteady.  Gait is wide spaced, some unsteadiness      Results for orders placed or performed in visit on 05/15/15   LIPID PANEL   Result Value Ref Range    Cholesterol, total 156 100 - 199 mg/dL    Triglyceride 76 0 - 149 mg/dL    HDL Cholesterol 48 >39 mg/dL    VLDL, calculated 15 5 - 40 mg/dL    LDL, calculated 93 0 - 99 mg/dL   HEMOGLOBIN A1C WITH EAG   Result  Value Ref Range    Hemoglobin A1c 8.3 (H) 4.8 - 5.6 %    Estimated average glucose 192 mg/dL   METABOLIC PANEL, COMPREHENSIVE   Result Value Ref Range    Glucose 121 (H) 65 - 99 mg/dL    BUN 15 8 - 27 mg/dL    Creatinine 7.08 9.40 - 1.27 mg/dL    GFR est non-AA 60 >90 mL/min/1.73    GFR est AA 70 >59 mL/min/1.73    BUN/Creatinine ratio 13 10 - 22    Sodium 142 134 - 144 mmol/L    Potassium 4.8 3.5 - 5.2 mmol/L    Chloride 105 97 - 108 mmol/L    CO2 21 18 - 29 mmol/L    Calcium 9.3 8.6 - 10.2 mg/dL    Protein, total 6.5 6.0 - 8.5 g/dL    Albumin 3.9 3.5 - 4.7 g/dL    GLOBULIN, TOTAL 2.6 1.5 - 4.5 g/dL    A-G Ratio 1.5 1.1 - 2.5    Bilirubin, total 0.3 0.0 - 1.2 mg/dL    Alk. phosphatase 69 39 - 117 IU/L    AST 20 0 - 40 IU/L    ALT 13 0 - 44 IU/L         Assessment:  Encounter Diagnoses   Name Primary?   ??? Type 2 diabetes mellitus with hyperglycemia (HCC)    ??? Pure hypercholesterolemia     ??? Essential hypertension with goal blood pressure less than 140/90    ??? Dizziness Yes   ??? Blurred vision            Plan:  See orders.  Orders Placed This Encounter   ??? MRI BRAIN W WO CONT     Standing Status:   Future     Standing Expiration Date:   07/17/2016     Order Specific Question:   Reason for Exam     Answer:   dysequilibrum, vertigo, decrease hearing, difficulty with vision more blurry.  Allergy CT contrast dye     Order Specific Question:   Is Patient Allergic to Contrast Dye?     Answer:   Unknown   ??? DISCONTD: clonazePAM (KLONOPIN) 0.5 mg tablet     Sig: Take 1 Tab by mouth nightly as needed. Max Daily Amount: 0.5 mg.     Dispense:  90 Tab     Refill:  0   ??? clonazePAM (KLONOPIN) 0.5 mg tablet     Sig: Take 1 Tab by mouth nightly as needed. Max Daily Amount: 0.5 mg.     Dispense:  90 Tab     Refill:  0   Given persistent symptoms vision abnormality gait disturbance would like to get MRI of the head with and without contrast has a history of iodine allergy  Follow up if symptoms worsen, change, fail to improve or any new symptoms develop.          There are no Patient Instructions on file for this visit.    (Please note that portions of this note were completed with a voice recognition program. Efforts were made to edit the dictations but occasionally words are mis-transcribed.)

## 2015-06-19 NOTE — Progress Notes (Signed)
Appointment made with Gastroenterology Dr Thad Ranger on Wednesday September 21,2016. Please arrive at 11:30 Am to fill out new patient paperwork.  Patient advised appointment date / time.  Called [employer] no pre cert needed for appointment.  Office notes already faxed to office.

## 2015-06-19 NOTE — Progress Notes (Signed)
Called [employer] for pre cert on MRI Brain.  It has been approved from 07/09/15 until 08/08/15 Case # [id number].  Letter being sent to our office and to patient.

## 2015-06-27 NOTE — Telephone Encounter (Signed)
ok 

## 2015-06-27 NOTE — Telephone Encounter (Signed)
Patient called to inform Dr. Ralph Leyden that he is feeling better now and is not having the dizziness anymore, so he canceled his MRI appointment at [facility] for 07/09/15 at 12:15.

## 2015-07-09 ENCOUNTER — Ambulatory Visit: Payer: MEDICARE | Primary: Family Medicine

## 2015-07-18 ENCOUNTER — Institutional Professional Consult (permissible substitution): Admit: 2015-07-18 | Discharge: 2015-07-18 | Payer: MEDICARE | Primary: Family Medicine

## 2015-07-18 DIAGNOSIS — Z23 Encounter for immunization: Secondary | ICD-10-CM

## 2015-07-25 MED ORDER — ACCU-CHEK SMARTVIEW TEST STRIPS
ORAL_STRIP | 3 refills | Status: AC
Start: 2015-07-25 — End: ?

## 2015-08-15 ENCOUNTER — Ambulatory Visit: Admit: 2015-08-15 | Discharge: 2015-08-15 | Payer: MEDICARE | Attending: Family Medicine | Primary: Family Medicine

## 2015-08-15 ENCOUNTER — Encounter

## 2015-08-15 DIAGNOSIS — E7800 Pure hypercholesterolemia, unspecified: Secondary | ICD-10-CM

## 2015-08-15 NOTE — Progress Notes (Signed)
Followed up with pt and he states PT is not really helping but he has only been 2 weeks. He will continue with PT and get back to us if he does not improve.

## 2015-08-15 NOTE — Progress Notes (Signed)
Diffuse idiopathic skeletal hyperostosis, of the lower spine.  This is a calcification of ligaments.  Treated symptomatically.  Can try physical therapy or be seen at a pain clinic for injections for symptomatic treatment of pain.  May want to try physical therapy if they do we can go ahead and refer

## 2015-08-15 NOTE — Progress Notes (Signed)
Subjective:     Terry Hamilton is a 81 y.o. male who presents today for follow up hospitalized [facility] 10/31 through 11/4 with lower GI bleed felt to be secondary to diverticular disease.  Did receive 3 units packed red cells spontaneous resolution no surgery.  No colonoscopy done had one 2014.  Was seen by Dr. Marthe Patch during the hospitalization.  Bowel habits have returned to his normal continues to have intermittent problems with constipation fairly well controlled at this time.  Recently seen by Dr. Park Breed and gastroenterology 9/16 no change in medications.  Continues to have mild chronic lower abdomen pain which is unchanged.  No fever chills no melena no hematochezia.  Appetites good weights been stable  Does complain of chronic low back pain generalized weakness of the legs seems to be worse after hospitalization.  No falls or injuries no focal paresthesia anesthesia or motor weakness no sphincter abnormalities  No chest pain, no angina no shortness of breath. No orthopnea or PND. No dependent edema.  No urinary frequency, urgency, dysuria. No change in voiding pattern or stream, no significant nocturia.  No problems with medications and is compliant.  Continues to see Dr. Jeannine Kitten to 3 months for his diagnosis prostate cancer      Past Medical History   Diagnosis Date   ??? Abdominal pain      chronic epigastric, 6-7 years   ??? CAD (coronary artery disease)      s/p CABG, Dr Donata Duff   ??? Constipation    ??? Diverticulosis      with diverticulitis   ??? GERD (gastroesophageal reflux disease)    ??? IBS (irritable bowel syndrome)    ??? Other and unspecified hyperlipidemia    ??? Prostate cancer (HCC)      FU Dr Jeannine Kitten   ??? Type II or unspecified type diabetes mellitus without mention of complication, uncontrolled Age 58   ??? Unspecified essential hypertension       Past Surgical History   Procedure Laterality Date   ??? Hx coronary artery bypass graft  2008   ??? Hx hernia repair       bilateral   ??? Hx appendectomy      ??? Hx cholecystectomy     ??? Hx laparotomy       for diverticulitis x3 with part of colon removed, wore colostomy for a shot period   ??? Hx prostatectomy     ??? Hx cataract removal       bilateral   ??? Hx colonoscopy  2014     tics, polyp   ??? Hx cryoablation of the prostate  03/2015     Current Outpatient Prescriptions   Medication Sig Dispense Refill   ??? ACCU-CHEK SMARTVIEW TEST STRIP strip USE AS DIRECTED 300 Strip 3   ??? clonazePAM (KLONOPIN) 0.5 mg tablet Take 1 Tab by mouth nightly as needed. Max Daily Amount: 0.5 mg. 90 Tab 0   ??? furosemide (LASIX) 20 mg tablet TAKE 1 TABLET EVERY DAY 90 Tab 1   ??? simvastatin (ZOCOR) 20 mg tablet TAKE 1 TABLET EVERY NIGHT 90 Tab 1   ??? sitaGLIPtin (JANUVIA) 100 mg tablet Take 1 Tab by mouth daily. 30 Tab 5   ??? lubiPROStone (AMITIZA) 8 mcg capsule Take 1 Cap by mouth daily (with breakfast). prn 30 Cap 5   ??? docusate sodium (COLACE) 100 mg capsule Take 1 Cap by mouth two (2) times a day. 60 Cap 5   ??? insulin glargine (LANTUS SOLOSTAR) 100  unit/mL (3 mL) pen 25 Units by SubCUTAneous route two (2) times a day. 3 Each 3   ??? metoprolol tartrate (LOPRESSOR) 50 mg tablet Take 1 Tab by mouth two (2) times a day. 180 Tab 1   ??? omeprazole (PRILOSEC) 20 mg capsule Take 1 Cap by mouth daily. 90 Cap 1   ??? ramipril (ALTACE) 10 mg capsule Take 1 Cap by mouth daily. 90 Cap 1   ??? meloxicam (MOBIC) 7.5 mg tablet Take 1 Tab by mouth daily. As needed for hip pain 30 Tab 3   ??? HYDROcodone-acetaminophen (NORCO) 5-325 mg per tablet Take 1 Tab by mouth every four (4) hours as needed for Pain.       Family History   Problem Relation Age of Onset   ??? Diabetes Father      with amputation   ??? Diabetes Maternal Grandmother      with blindness   ??? Diabetes Brother      Allergies   Allergen Reactions   ??? Contrast Agent [Iodine] Rash   ??? Ibuprofen Other (comments)     GI upset   ??? Lipitor [Atorvastatin] Myalgia   ??? Zetia [Ezetimibe] Other (comments)      Had trouble with his eyes that improved upon stopping this med       Social History   Substance Use Topics   ??? Smoking status: Former Smoker     Quit date: 07/20/1962   ??? Smokeless tobacco: Not on file   ??? Alcohol use No        Review of systems as per the above and previous documentation      Objective:     Wt Readings from Last 3 Encounters:   08/15/15 192 lb 6.4 oz (87.3 kg)   06/18/15 190 lb 9.6 oz (86.5 kg)   06/12/15 192 lb 6.4 oz (87.3 kg)     Temp Readings from Last 3 Encounters:   08/15/15 97.8 ??F (36.6 ??C) (Temporal)   06/18/15 97 ??F (36.1 ??C) (Temporal)   06/12/15 97.9 ??F (36.6 ??C) (Oral)     BP Readings from Last 3 Encounters:   08/15/15 134/64   06/18/15 132/68   06/12/15 110/60     Pulse Readings from Last 3 Encounters:   08/15/15 (!) 58   06/18/15 66   06/12/15 66     General Appearance:    Alert, cooperative, no distress, appears stated age   Head:    Normocephalic, without obvious abnormality, atraumatic      Eyes:    PERRL, conjunctiva/corneas clear, EOM's intact, fundi     benign, both eyes   Ears:    Normal TM's and external ear canals, both ears   Nose:   Nares normal, septum midline, mucosa normal, no drainage        or sinus tenderness   Throat:   Lips, mucosa, and tongue normal; teeth and gums normal   Neck:   Supple, symmetrical, trachea midline, no adenopathy;     thyroid:  no enlargement/tenderness/nodules; no carotid    bruit or JVD   Back:     Symmetric, no curvature, ROM normal, no CVA tenderness, paraspinal tenderness lumbar area    Lungs:     Clear to auscultation bilaterally, respirations unlabored   Chest Wall:    No tenderness or deformity      Heart:    Regular rate and rhythm, S1 and S2 normal, no murmur, rub        or gallop  Abdomen:                       Extremities:                              Pulses:                              Skin:                  Lymph nodes:                    Neurologic:                   Soft, non-tender, bowel sounds active all four quadrants,     no masses, no organomegaly    Extremities normal, atraumatic, no     cyanosis or edema, straight leg raise is negative     2+ and symmetric all extremities     Skin color, texture, turgor normal, no rashes or lesions       Cervical, supraclavicular, and axillary   nodes normal     CNII-XII intact, normal strength, sensation and reflexes   Throughout                      Assessment / Plan:     1. Pure hypercholesterolemia    2. Essential hypertension    3. Type 2 diabetes mellitus with hyperglycemia, with long-term current use of insulin (HCC)    4. Gastroesophageal reflux disease without esophagitis    5. Diverticulosis of large intestine with hemorrhage    6. Constipation, unspecified constipation type    7. Coronary artery disease due to lipid rich plaque    8. Irritable bowel syndrome, unspecified type    9. Prostate cancer (HCC)    10. Chronic midline low back pain, with sciatica presence unspecified    11. Weakness of both legs    12. Encounter for immunization            Plan   Orders Placed This Encounter   ??? XR SPINE LUMB MIN 4 V     Standing Status:   Future     Standing Expiration Date:   09/13/2016     Order Specific Question:   Reason for Exam     Answer:   LBP     Order Specific Question:   Is Patient Allergic to Contrast Dye?     Answer:   No   ??? PNEUMOCOCCAL CONJ VACCINE 13 VALENT IM   ??? CBC WITH AUTOMATED DIFF   ??? HEMOGLOBIN A1C WITH EAG   ??? LIPID PANEL   ??? METABOLIC PANEL, COMPREHENSIVE   ??? TSH 3RD GENERATION   ??? REFERRAL TO PHYSICAL THERAPY     Referral Priority:   Routine     Referral Type:   PT/OT/ST     Referral Reason:   Specialty Services Required     Medications reviewed will continue, fasting lab, referral to physical therapy  Follow-up 3 months or when necessary, continue to monitor blood sugar blood pressure bring in for review      (Please note that portions of this note were completed with a voice  recognition program. Efforts were made to edit the dictations but occasionally words are mis-transcribed.)

## 2015-08-15 NOTE — Progress Notes (Signed)
Called pt on home and cell phone. Left message.

## 2015-08-16 LAB — CBC WITH AUTOMATED DIFF
ABS. BASOPHILS: 0 10*3/uL (ref 0.0–0.2)
ABS. EOSINOPHILS: 0.1 10*3/uL (ref 0.0–0.4)
ABS. IMM. GRANS.: 0 10*3/uL (ref 0.0–0.1)
ABS. MONOCYTES: 0.5 10*3/uL (ref 0.1–0.9)
ABS. NEUTROPHILS: 5.1 10*3/uL (ref 1.4–7.0)
Abs Lymphocytes: 1.4 10*3/uL (ref 0.7–3.1)
BASOPHILS: 1 %
EOSINOPHILS: 2 %
HCT: 35 % — ABNORMAL LOW (ref 37.5–51.0)
HGB: 11.4 g/dL — ABNORMAL LOW (ref 12.6–17.7)
IMMATURE GRANULOCYTES: 0 %
Lymphocytes: 19 %
MCH: 28.1 pg (ref 26.6–33.0)
MCHC: 32.6 g/dL (ref 31.5–35.7)
MCV: 86 fL (ref 79–97)
MONOCYTES: 7 %
NEUTROPHILS: 71 %
PLATELET: 293 10*3/uL (ref 150–379)
RBC: 4.05 x10E6/uL — ABNORMAL LOW (ref 4.14–5.80)
RDW: 15.1 % (ref 12.3–15.4)
WBC: 7.2 10*3/uL (ref 3.4–10.8)

## 2015-08-16 LAB — METABOLIC PANEL, COMPREHENSIVE
A-G Ratio: 1.3 (ref 1.1–2.5)
ALT (SGPT): 9 IU/L (ref 0–44)
AST (SGOT): 12 IU/L (ref 0–40)
Albumin: 3.9 g/dL (ref 3.5–4.7)
Alk. phosphatase: 62 IU/L (ref 39–117)
BUN/Creatinine ratio: 17 (ref 10–22)
BUN: 18 mg/dL (ref 8–27)
Bilirubin, total: 0.4 mg/dL (ref 0.0–1.2)
CO2: 22 mmol/L (ref 18–29)
Calcium: 9.4 mg/dL (ref 8.6–10.2)
Chloride: 104 mmol/L (ref 97–106)
Creatinine: 1.08 mg/dL (ref 0.76–1.27)
GFR est AA: 75 mL/min/{1.73_m2} (ref >59)
GFR est non-AA: 64 mL/min/{1.73_m2} (ref >59)
GLOBULIN, TOTAL: 2.9 g/dL (ref 1.5–4.5)
Glucose: 150 mg/dL — ABNORMAL HIGH (ref 65–99)
Potassium: 4.6 mmol/L (ref 3.5–5.2)
Protein, total: 6.8 g/dL (ref 6.0–8.5)
Sodium: 140 mmol/L (ref 136–144)

## 2015-08-16 LAB — LIPID PANEL
Cholesterol, total: 145 mg/dL (ref 100–199)
HDL Cholesterol: 47 mg/dL (ref >39)
LDL, calculated: 82 mg/dL (ref 0–99)
Triglyceride: 81 mg/dL (ref 0–149)
VLDL, calculated: 16 mg/dL (ref 5–40)

## 2015-08-16 LAB — HEMOGLOBIN A1C WITH EAG
Estimated average glucose: 148 mg/dL
Hemoglobin A1c: 6.8 % — ABNORMAL HIGH (ref 4.8–5.6)

## 2015-08-16 LAB — TSH 3RD GENERATION: TSH: 1.34 u[IU]/mL (ref 0.450–4.500)

## 2015-08-16 NOTE — Progress Notes (Signed)
Pt asked for samples of Januvia 100 mg. He stated he forgot to ask yesterday when seeing Dr. Ralph Leydenoyle.  Princess PernaGave januvia Lot [id number] expire March/2019 [employer]

## 2015-08-16 NOTE — Progress Notes (Signed)
CBC with mild anemia, globin A1c improved at 6.8 good job  Lipid profile is good, chemistry profile normal except for a glucose of 150 TSH is in the normal range

## 2015-08-16 NOTE — Progress Notes (Signed)
Mailed lab results to patient per Dr. Harry Doyle

## 2015-09-21 MED ORDER — PEN NEEDLE, DIABETIC 32 GAUGE X 1/6"
32 gauge x 1/6" | PEN_INJECTOR | Freq: Two times a day (BID) | 3 refills | Status: AC
Start: 2015-09-21 — End: ?

## 2015-10-01 MED ORDER — FLAVOXATE 100 MG TAB
100 mg | ORAL_TABLET | ORAL | 1 refills | Status: DC
Start: 2015-10-01 — End: 2015-10-17

## 2015-10-01 MED ORDER — FUROSEMIDE 20 MG TAB
20 mg | ORAL_TABLET | ORAL | 1 refills | Status: DC
Start: 2015-10-01 — End: 2015-10-17

## 2015-10-01 MED ORDER — METOPROLOL TARTRATE 50 MG TAB
50 mg | ORAL_TABLET | ORAL | 1 refills | Status: DC
Start: 2015-10-01 — End: 2016-02-18

## 2015-10-01 MED ORDER — SIMVASTATIN 20 MG TAB
20 mg | ORAL_TABLET | ORAL | 1 refills | Status: DC
Start: 2015-10-01 — End: 2016-02-18

## 2015-10-01 MED ORDER — OMEPRAZOLE 20 MG CAP, DELAYED RELEASE
20 mg | ORAL_CAPSULE | ORAL | 1 refills | Status: DC
Start: 2015-10-01 — End: 2016-02-18

## 2015-10-08 MED ORDER — CLONAZEPAM 0.5 MG TAB
0.5 mg | ORAL_TABLET | ORAL | 0 refills | Status: DC
Start: 2015-10-08 — End: 2015-12-24

## 2015-10-08 MED ORDER — RAMIPRIL 10 MG CAP
10 mg | ORAL_CAPSULE | ORAL | 1 refills | Status: DC
Start: 2015-10-08 — End: 2015-10-11

## 2015-10-11 MED ORDER — RAMIPRIL 10 MG CAP
10 mg | ORAL_CAPSULE | ORAL | 1 refills | Status: DC
Start: 2015-10-11 — End: 2016-02-18

## 2015-10-17 ENCOUNTER — Ambulatory Visit: Admit: 2015-10-17 | Discharge: 2015-10-17 | Payer: MEDICARE | Attending: Family Medicine | Primary: Family Medicine

## 2015-10-17 DIAGNOSIS — I2510 Atherosclerotic heart disease of native coronary artery without angina pectoris: Secondary | ICD-10-CM

## 2015-10-17 MED ORDER — NITROGLYCERIN 0.4 MG SUBLINGUAL TAB
0.4 mg | ORAL_TABLET | SUBLINGUAL | 1 refills | Status: AC | PRN
Start: 2015-10-17 — End: ?

## 2015-10-17 MED ORDER — ASPIRIN 81 MG TAB, DELAYED RELEASE
81 mg | ORAL_TABLET | Freq: Every day | ORAL | 3 refills | Status: AC
Start: 2015-10-17 — End: ?

## 2015-10-17 MED ORDER — CHLORTHALIDONE 25 MG TAB
25 mg | ORAL_TABLET | Freq: Every day | ORAL | 3 refills | Status: AC
Start: 2015-10-17 — End: ?

## 2015-10-17 NOTE — ACP (Advance Care Planning) (Signed)
Discussed ACP with pt and was given documents to review. Advised pt to discuss with spouse and have them to come on the visit to discuss ACP with provider.

## 2015-10-17 NOTE — Progress Notes (Signed)
Terry Hamilton is a 82 y.o. male presenting for/with:    Anemia and Irritable Bowel Syndrome      HPI:  Anemia and hx GI bleed fall 2016  Had acute diverticulitis with bleeding in fall 2016. Tx with abx, monitored in ICU, sx gradually resolved. Since then, pt reports having persistent hard stools. Started on [place], about twice a week (doesn't take daily because gets too loose stool). Treatment to date: increased fiber diet and amitiza. Last colo 2014 in [employer].  Lab Results   Component Value Date/Time    WBC 7.2 08/15/2015 09:14 AM    HGB 11.4 08/15/2015 09:14 AM    HCT 35.0 08/15/2015 09:14 AM    PLATELET 293 08/15/2015 09:14 AM    MCV 86 08/15/2015 09:14 AM       Diabetes.  Sugars controlled well  Hypoglycemia: none  Tolerating current treatment well  Current medications include januvia 152m and insulin lantus 25 units. Not on metformin due to bad stomach cramps and diarrhea with plain and ER metformin 5055mevery day.    Lab Results   Component Value Date/Time    Hemoglobin A1c 6.8 08/15/2015 09:14 AM    Hemoglobin A1c 8.3 05/15/2015 09:08 AM    Hemoglobin A1c 8.0 01/09/2015 08:48 AM    Glucose 150 08/15/2015 09:14 AM    LDL, calculated 82 08/15/2015 09:14 AM    Creatinine 1.08 08/15/2015 09:14 AM     No results found for: MCACR, MCA1, MCA2, MCA3, MCA4, UMCA, MCAU, MICALBRAT, MCAU2, MCALPOCT    Last eye exam performed summer 2016 with Dr. OkDoreene Nesthas hx of DR, mild NP.  Last foot exam >1y    CHD and Hyperlipidemia.  On zocor 20, B-blocker, ACEI, loop diuretic. Tol well. No myalgias, arthralgias, unusual weakness, but had noted decreased exercise tolerance over the past year.  Lab Results   Component Value Date/Time    Cholesterol, total 145 08/15/2015 09:14 AM    HDL Cholesterol 47 08/15/2015 09:14 AM    LDL, calculated 82 08/15/2015 09:14 AM    VLDL, calculated 16 08/15/2015 09:14 AM    Triglyceride 81 08/15/2015 09:14 AM       Lab Results   Component Value Date/Time     ALT 9 08/15/2015 09:14 AM    AST 12 08/15/2015 09:14 AM    Alk. phosphatase 62 08/15/2015 09:14 AM    Bilirubin, total 0.4 08/15/2015 09:14 AM     Hypertension.   Blood pressures have been worsening. Management at last visit included continuing current regimen . Current regimen: ACE inhibitor, beta-blocker and loop diuretic. Symptoms include tiredness/fatigue. Patient denies chest pain, palpitations, orthopnea.  Lab review:   Lab Results   Component Value Date/Time    Sodium 140 08/15/2015 09:14 AM    Potassium 4.6 08/15/2015 09:14 AM    Chloride 104 08/15/2015 09:14 AM    CO2 22 08/15/2015 09:14 AM    Glucose 150 08/15/2015 09:14 AM    BUN 18 08/15/2015 09:14 AM    Creatinine 1.08 08/15/2015 09:14 AM    BUN/Creatinine ratio 17 08/15/2015 09:14 AM    GFR est AA 75 08/15/2015 09:14 AM    GFR est non-AA 64 08/15/2015 09:14 AM    Calcium 9.4 08/15/2015 09:14 AM     PMH, SH, Medications/Allergies: reviewed, on chart.    ROS:  Constitutional: No fever, chills or weight loss  Respiratory: No cough, SOB   CV: No chest pain or Palpitations    Visit  Vitals   ??? BP 162/57   ??? Pulse (!) 58   ??? Temp 97.7 ??F (36.5 ??C) (Oral)   ??? Resp 16   ??? Ht 5' 7.5" (1.715 m)   ??? Wt 190 lb (86.2 kg)   ??? SpO2 99%   ??? BMI 29.32 kg/m2     Wt Readings from Last 3 Encounters:   10/17/15 190 lb (86.2 kg)   08/15/15 192 lb 6.4 oz (87.3 kg)   06/18/15 190 lb 9.6 oz (86.5 kg)     BP Readings from Last 3 Encounters:   10/17/15 162/57   08/15/15 134/64   06/18/15 132/68     Physical Examination: General appearance - alert, well appearing, and in no distress  Mental status - alert, oriented to person, place, and time  Eyes - pupils equal and reactive, extraocular eye movements intact  ENT - bilateral external ears and nose normal. Normal lips  Neck - supple, no significant adenopathy, no thyromegaly or mass  Lymphatics - no palpable lymphadenopathy, no hepatosplenomegaly  Chest - clear to auscultation, no wheezes, rales or rhonchi, symmetric air entry   Heart - normal rate, regular rhythm, normal S1, S2, no murmurs, rubs, clicks or gallops  Extremities - peripheral pulses normal, no pedal edema, no clubbing or cyanosis    A/P:  CHD and HLD  With some sx of DOE/fatigue. Check echo, work on BP's. chest pain precautions.    HTN  Up a lot today. Prev in goal, but concerned about cardiac sx. Change lasix to chlorthalidone 75m every day. BP checks. PLan BMP at f/u.    Diverticulitis and constipation  Work on increasing dietary fiber to 5 servings/day. Ok to take aNetherland[place] other day.    DM2 with DR  well controlled. con't current tx. Plan check feet, A1c, mic/cr next visit.    F/U 15mor PRN.

## 2015-10-17 NOTE — Patient Instructions (Signed)
Angina: Care Instructions  Your Care Instructions    You have a problem called angina. Angina happens when there is not enough blood flow to your heart muscle. Angina is a sign of coronary artery disease (CAD). CAD occurs when blood vessels that supply the heart become narrowed. Having CAD increases your risk of a heart attack.  Chest pain or pressure is the most common symptom of angina. But some people have other symptoms, like:  ?? Pain, pressure, or a strange feeling in the back, neck, jaw, or upper belly, or in one or both shoulders or arms.  ?? Shortness of breath.  ?? Nausea or vomiting.  ?? Lightheadedness or sudden weakness.  ?? Fast or irregular heartbeat.  Women are somewhat more likely than men to have angina symptoms like shortness of breath, nausea, and back or jaw pain.  Angina can be dangerous. That's why it is important to pay attention to your symptoms. Know what is typical for you, learn how to control your symptoms, and understand when you need to get treatment.  A change in your usual pattern of symptoms is an emergency. It may mean that you are having a heart attack.  The doctor has checked you carefully, but problems can develop later. If you notice any problems or new symptoms, get medical treatment right away.  Follow-up care is a key part of your treatment and safety. Be sure to make and go to all appointments, and call your doctor if you are having problems. It's also a good idea to know your test results and keep a list of the medicines you take.  How can you care for yourself at home?  Medicines  ?? If your doctor has given you nitroglycerin for angina symptoms, keep it with you at all times. If you have symptoms, sit down and rest, and take the first dose of nitroglycerin as directed. If your symptoms get worse or are not getting better within 5 minutes, call 911 right away. Stay on the phone. The emergency operator will give you further instructions.   ?? If your doctor advises it, take 1 low-dose aspirin a day to prevent heart attack.  ?? Be safe with medicines. Take your medicines exactly as prescribed. Call your doctor if you think you are having a problem with your medicine. You will get more details on the specific medicines your doctor prescribes.  Lifestyle changes  ?? Do not smoke. If you need help quitting, talk to your doctor about stop-smoking programs and medicines. These can increase your chances of quitting for good.  ?? Eat a heart-healthy diet that is low in saturated fat and salt, and is high in fiber. Talk to your doctor or a dietitian about healthy eating.  ?? Stay at a healthy weight. Or lose weight if you need to.  Activity  ?? Talk to your doctor about a level of activity that is safe for you.  ?? If an activity causes angina symptoms, stop and rest.  When should you call for help?  Call 911 anytime you think you may need emergency care. For example, call if:  ?? You passed out (lost consciousness).  ?? You have symptoms of a heart attack. These may include:  ?? Chest pain or pressure, or a strange feeling in the chest.  ?? Sweating.  ?? Shortness of breath.  ?? Nausea or vomiting.  ?? Pain, pressure, or a strange feeling in the back, neck, jaw, or upper belly or in one or both   shoulders or arms.  ?? Lightheadedness or sudden weakness.  ?? A fast or irregular heartbeat.  After you call 911, the operator may tell you to chew 1 adult-strength or 2 to 4 low-dose aspirin. Wait for an ambulance. Do not try to drive yourself.  ?? You have angina symptoms that do not go away with rest or are not getting better within 5 minutes after you take a dose of nitroglycerin.  Call your doctor now or seek immediate medical care if:  ?? You are having angina symptoms more often than usual, or they are different or worse than usual.  ?? You feel dizzy or lightheaded, or you feel like you may faint.  Watch closely for changes in your health, and be sure to contact your  doctor if you have any problems.  Where can you learn more?  Go to http://www.healthwise.net/GoodHelpConnections.  Enter H129 in the search box to learn more about "Angina: Care Instructions."  Current as of: October 25, 2014  Content Version: 11.1  ?? 2006-2016 [employer]. Care instructions adapted under license by [employer] (which disclaims liability or warranty for this information). If you have questions about a medical condition or this instruction, always ask your healthcare professional. [employer] disclaims any warranty or liability for your use of this information.

## 2015-10-23 LAB — AMB EXT EJECTION FRACTION: EF %, External: >50

## 2015-11-08 ENCOUNTER — Encounter: Attending: Family Medicine | Primary: Family Medicine

## 2015-11-09 ENCOUNTER — Encounter

## 2015-11-14 ENCOUNTER — Ambulatory Visit: Admit: 2015-11-14 | Discharge: 2015-11-14 | Payer: MEDICARE | Attending: Family Medicine | Primary: Family Medicine

## 2015-11-14 DIAGNOSIS — I2510 Atherosclerotic heart disease of native coronary artery without angina pectoris: Secondary | ICD-10-CM

## 2015-11-14 NOTE — Progress Notes (Signed)
Pt is doing 25 units of insulin bid  informed to boost to 28 units bid

## 2015-11-14 NOTE — Progress Notes (Signed)
Pt is taking his insulin 25 units bid. Informed he needs to boost to 28 units bid.

## 2015-11-14 NOTE — Progress Notes (Signed)
Nurse confirms pt actually on 50 units daily. will boost to 56 units daily.

## 2015-11-14 NOTE — Patient Instructions (Addendum)
Diabetes Foot Health: Care Instructions  Your Care Instructions    When you have diabetes, your feet need extra care and attention. Diabetes can damage the nerve endings and blood vessels in your feet, making you less likely to notice when your feet are injured. Diabetes also limits your body's ability to fight infection and get blood to areas that need it. If you get a minor foot injury, it could become an ulcer or a serious infection. With good foot care, you can prevent most of these problems.  Caring for your feet can be quick and easy. Most of the care can be done when you are bathing or getting ready for bed.  Follow-up care is a key part of your treatment and safety. Be sure to make and go to all appointments, and call your doctor if you are having problems. It???s also a good idea to know your test results and keep a list of the medicines you take.  How can you care for yourself at home?  ?? Keep your blood sugar close to normal by watching what and how much you eat, monitoring blood sugar, taking medicines if prescribed, and getting regular exercise.  ?? Do not smoke. Smoking affects blood flow and can make foot problems worse. If you need help quitting, talk to your doctor about stop-smoking programs and medicines. These can increase your chances of quitting for good.  ?? Eat a diet that is low in fats. High fat intake can cause fat to build up in your blood vessels and decrease blood flow.  ?? Inspect your feet daily for blisters, cuts, cracks, or sores. If you cannot see well, use a mirror or have someone help you.  ?? Take care of your feet:  ?? Wash your feet every day. Use warm (not hot) water. Check the water temperature with your wrists or other part of your body, not your feet.  ?? Dry your feet well. Pat them dry. Do not rub the skin on your feet too hard. Dry well between your toes. If the skin on your feet stays moist, bacteria or a fungus can grow, which can lead to infection.   ?? Keep your skin soft. Use moisturizing skin cream to keep the skin on your feet soft and prevent calluses and cracks. But do not put the cream between your toes, and stop using any cream that causes a rash.  ?? Clean underneath your toenails carefully. Do not use a sharp object to clean underneath your toenails. Use the blunt end of a nail file or other rounded tool.  ?? Trim and file your toenails straight across to prevent ingrown toenails. Use a nail clipper, not scissors. Use an emery board to smooth the edges.  ?? Change socks daily. Socks without seams are best, because seams often rub the feet. You can find socks for people with diabetes from specialty catalogs.  ?? Look inside your shoes every day for things like gravel or torn linings, which could cause blisters or sores.  ?? Buy shoes that fit well:  ?? Look for shoes that have plenty of space around the toes. This helps prevent bunions and blisters.  ?? Try on shoes while wearing the kind of socks you will usually wear with the shoes.  ?? Avoid plastic shoes. They may rub your feet and cause blisters. Good shoes should be made of materials that are flexible and breathable, such as leather or cloth.  ?? Break in new shoes slowly by wearing   them for no more than an hour a day for several days. Take extra time to check your feet for red areas, blisters, or other problems after you wear new shoes.  ?? Do not go barefoot. Do not wear sandals, and do not wear shoes with very thin soles. Thin soles are easy to puncture. They also do not protect your feet from hot pavement or cold weather.  ?? Have your doctor check your feet during each visit. If you have a foot problem, see your doctor. Do not try to treat an early foot problem at home. Home remedies or treatments that you can buy without a prescription (such as corn removers) can be harmful.  ?? Always get early treatment for foot problems. A minor irritation can lead to a major problem if not properly cared for early.   When should you call for help?  Call your doctor now or seek immediate medical care if:  ?? You have a foot sore, an ulcer or break in the skin that is not healing after 4 days, bleeding corns or calluses, or an ingrown toenail.  ?? You have blue or black areas, which can mean bruising or blood flow problems.  ?? You have peeling skin or tiny blisters between your toes or cracking or oozing of the skin.  ?? You have a fever for more than 24 hours and a foot sore.  ?? You have new numbness or tingling in your feet that does not go away after you move your feet or change positions.  ?? You have unexplained or unusual swelling of the foot or ankle.  Watch closely for changes in your health, and be sure to contact your doctor if:  ?? You cannot do proper foot care.  Where can you learn more?  Go to http://www.healthwise.net/GoodHelpConnections.  Enter A739 in the search box to learn more about "Diabetes Foot Health: Care Instructions."  Current as of: Feb 19, 2015  Content Version: 11.1  ?? 2006-2016 [employer]. Care instructions adapted under license by [employer] (which disclaims liability or warranty for this information). If you have questions about a medical condition or this instruction, always ask your healthcare professional. [employer] disclaims any warranty or liability for your use of this information.  If you have any questions regarding mychart, you may call mychart support at [phone].

## 2015-11-14 NOTE — Progress Notes (Signed)
Left message

## 2015-11-14 NOTE — Progress Notes (Signed)
Terry Hamilton is a 82 y.o. male presenting for/with:    Diabetes (3 mo check)      HPI:  Diabetes.  Sugars controlled well  Hypoglycemia: none  Tolerating current treatment well  Current medications include januvia '100mg'$  and insulin lantus 25 units. Not on metformin due to bad stomach cramps and diarrhea with plain and ER metformin '500mg'$  every day.    Lab Results   Component Value Date/Time    Hemoglobin A1c 6.8 08/15/2015 09:14 AM    Hemoglobin A1c 8.3 05/15/2015 09:08 AM    Hemoglobin A1c 8.0 01/09/2015 08:48 AM    Glucose 150 08/15/2015 09:14 AM    LDL, calculated 82 08/15/2015 09:14 AM    Creatinine 1.08 08/15/2015 09:14 AM     No results found for: MCACR, MCA1, MCA2, MCA3, MCA4, UMCA, MCAU, MICALBRAT, MCAU2, MCALPOCT    Last eye exam performed summer 2016 with Dr. Doreene Nest, has hx of DR, mild NP.  Last foot exam >1y    CHD and Hyperlipidemia.  On zocor 20, B-blocker, ACEI, loop diuretic. Tol well. No myalgias, arthralgias, unusual weakness, but had noted decreased exercise tolerance over the past year.  Lab Results   Component Value Date/Time    Cholesterol, total 145 08/15/2015 09:14 AM    HDL Cholesterol 47 08/15/2015 09:14 AM    LDL, calculated 82 08/15/2015 09:14 AM    VLDL, calculated 16 08/15/2015 09:14 AM    Triglyceride 81 08/15/2015 09:14 AM       Lab Results   Component Value Date/Time    ALT (SGPT) 9 08/15/2015 09:14 AM    AST (SGOT) 12 08/15/2015 09:14 AM    Alk. phosphatase 62 08/15/2015 09:14 AM    Bilirubin, total 0.4 08/15/2015 09:14 AM     Hypertension.   Blood pressures have been worsening. Management at last visit included continuing current regimen . Current regimen: ACE inhibitor, beta-blocker and loop diuretic. Symptoms include tiredness/fatigue. Patient denies chest pain, palpitations, orthopnea.  Lab review:   Lab Results   Component Value Date/Time    Sodium 140 08/15/2015 09:14 AM    Potassium 4.6 08/15/2015 09:14 AM    Chloride 104 08/15/2015 09:14 AM    CO2 22 08/15/2015 09:14 AM     Glucose 150 08/15/2015 09:14 AM    BUN 18 08/15/2015 09:14 AM    Creatinine 1.08 08/15/2015 09:14 AM    BUN/Creatinine ratio 17 08/15/2015 09:14 AM    GFR est AA 75 08/15/2015 09:14 AM    GFR est non-AA 64 08/15/2015 09:14 AM    Calcium 9.4 08/15/2015 09:14 AM     PMH, SH, Medications/Allergies: reviewed, on chart.    ROS:  Constitutional: No fever, chills or weight loss  Respiratory: No cough, SOB   CV: No chest pain or Palpitations    Visit Vitals   ??? BP 140/61 (BP 1 Location: Right arm, BP Patient Position: Sitting)   ??? Pulse 60   ??? Temp 97.4 ??F (36.3 ??C) (Oral)   ??? Resp 16   ??? Ht 5' 7.5" (1.715 m)   ??? Wt 191 lb (86.6 kg)   ??? SpO2 98%   ??? BMI 29.47 kg/m2     Wt Readings from Last 3 Encounters:   11/14/15 191 lb (86.6 kg)   10/17/15 190 lb (86.2 kg)   08/15/15 192 lb 6.4 oz (87.3 kg)     BP Readings from Last 3 Encounters:   11/14/15 140/61   10/17/15 162/57   08/15/15 134/64  Physical Examination: General appearance - alert, well appearing, and in no distress  Mental status - alert, oriented to person, place, and time  Eyes - pupils equal and reactive, extraocular eye movements intact  ENT - bilateral external ears and nose normal. Normal lips  Neck - supple, no significant adenopathy, no thyromegaly or mass  Lymphatics - no palpable lymphadenopathy, no hepatosplenomegaly  Chest - clear to auscultation, no wheezes, rales or rhonchi, symmetric air entry  Heart - normal rate, regular rhythm, normal S1, S2, no murmurs, rubs, clicks or gallops  Extremities - peripheral pulses normal, no pedal edema, no clubbing or cyanosis. Foot check: No calluses, no tinea. DP, PT pulses 2+ bilat. Monofilament test normal bilat.    A/P:  CHD and HLD  Improved DOE/fatigue. Echo reassuring, just has marked LVH, con't to work on [employer].     HTN  In goal today. Con't current tx. Check BMP.    Diverticulitis and constipation  Work on increasing dietary fiber to 5 servings/day. Ok to take [place] every other day.    DM2 with DR   well controlled. con't current tx. check A1c, mic/cr next visit.    F/U [address] PRN.

## 2015-11-14 NOTE — Progress Notes (Signed)
Nursing reports pt on insulin 50 units daily. That's a lot different than he said at the visit. Will have nursing verify the insulin dose, he may be thinking about his  metformin pills, but if he's indeed been taking 50 units insulin daily, will boost to 56 units daily.

## 2015-11-15 LAB — MICROALBUMIN, UR, RAND W/ MICROALB/CREAT RATIO
Creatinine, urine random: 82.4 mg/dL
Microalb/Creat ratio (ug/mg creat.): 479.1 mg/g creat — ABNORMAL HIGH (ref 0.0–30.0)
Microalbumin, urine: 394.8 ug/mL

## 2015-11-15 LAB — METABOLIC PANEL, BASIC
BUN/Creatinine ratio: 17 (ref 10–22)
BUN: 19 mg/dL (ref 8–27)
CO2: 21 mmol/L (ref 18–29)
Calcium: 9 mg/dL (ref 8.6–10.2)
Chloride: 105 mmol/L (ref 96–106)
Creatinine: 1.1 mg/dL (ref 0.76–1.27)
GFR est AA: 73 mL/min/{1.73_m2} (ref >59)
GFR est non-AA: 63 mL/min/{1.73_m2} (ref >59)
Glucose: 192 mg/dL — ABNORMAL HIGH (ref 65–99)
Potassium: 4.8 mmol/L (ref 3.5–5.2)
Sodium: 142 mmol/L (ref 134–144)

## 2015-11-15 LAB — HEMOGLOBIN A1C WITH EAG
Estimated average glucose: 192 mg/dL
Hemoglobin A1c: 8.3 % — ABNORMAL HIGH (ref 4.8–5.6)

## 2015-11-15 MED ORDER — INSULIN GLARGINE 100 UNIT/ML (3 ML) SUB-Q PEN
100 unit/mL (3 mL) | Freq: Two times a day (BID) | SUBCUTANEOUS | 3 refills | Status: DC
Start: 2015-11-15 — End: 2015-12-24

## 2015-11-15 NOTE — Progress Notes (Addendum)
Sugar up. Signs of kidney irritation present. Salt levels ok. Boost lantus to 28 units daily.

## 2015-11-15 NOTE — Addendum Note (Signed)
Addended by: Georgiann Hahn on: 11/15/2015 06:12 PM      Modules accepted: Orders

## 2015-12-20 MED ORDER — JANUVIA 100 MG TABLET
100 mg | ORAL_TABLET | ORAL | 11 refills | Status: DC
Start: 2015-12-20 — End: 2016-01-11

## 2015-12-24 ENCOUNTER — Telehealth

## 2015-12-24 MED ORDER — INSULIN GLARGINE 100 UNIT/ML (3 ML) SUB-Q PEN
100 unit/mL (3 mL) | SUBCUTANEOUS | 3 refills | Status: DC
Start: 2015-12-24 — End: 2016-02-18

## 2015-12-24 NOTE — Addendum Note (Signed)
Addended by: Georgiann HahnBAVUSO, Jaymien Landin K on: 12/24/2015 07:32 PM      Modules accepted: Orders

## 2015-12-24 NOTE — Telephone Encounter (Signed)
Patient called and for the past three mornings his BS has been running low. Saturday it was 82, Sunday it was 92, Monday it was 78 before eating. In the morning he has been feeling light headed and weak. He said that his insulin was increased from 25 units AM and PM to 28 units AM and PM. I had patient check his BS while on the phone with me, it was165 and he was feeling better. Please advise if he should come in to be seen or any changes need to be made with his insulin doses.

## 2015-12-24 NOTE — Telephone Encounter (Addendum)
Cut to 27 units lantus at night, keep AM at 25 units.

## 2015-12-25 MED ORDER — CLONAZEPAM 0.5 MG TAB
0.5 mg | ORAL_TABLET | ORAL | 0 refills | Status: DC
Start: 2015-12-25 — End: 2016-01-04

## 2015-12-25 NOTE — Telephone Encounter (Signed)
Patient advised of insulin dose change.

## 2016-01-04 MED ORDER — CLONAZEPAM 0.5 MG TAB
0.5 mg | ORAL_TABLET | ORAL | 1 refills | Status: AC
Start: 2016-01-04 — End: ?

## 2016-01-09 NOTE — Progress Notes (Signed)
Advised pt to take 27 unit of lantus in the am and 25 units in the pm per DR. Bavuso

## 2016-01-11 ENCOUNTER — Ambulatory Visit: Admit: 2016-01-11 | Discharge: 2016-01-11 | Payer: MEDICARE | Attending: Family Medicine | Primary: Family Medicine

## 2016-01-11 DIAGNOSIS — E11319 Type 2 diabetes mellitus with unspecified diabetic retinopathy without macular edema: Secondary | ICD-10-CM

## 2016-01-11 MED ORDER — AMITIZA 24 MCG CAPSULE
24 mcg | ORAL_CAPSULE | Freq: Every day | ORAL | 11 refills | Status: DC
Start: 2016-01-11 — End: 2016-02-11

## 2016-01-11 MED ORDER — SITAGLIPTIN 100 MG TAB
100 mg | ORAL_TABLET | Freq: Every day | ORAL | 0 refills | Status: DC
Start: 2016-01-11 — End: 2016-02-11

## 2016-01-11 MED ORDER — SITAGLIPTIN 100 MG TAB
100 mg | ORAL_TABLET | ORAL | 11 refills | Status: DC
Start: 2016-01-11 — End: 2016-02-11

## 2016-01-11 MED ORDER — MECLIZINE 25 MG TAB
25 mg | ORAL_TABLET | Freq: Three times a day (TID) | ORAL | 3 refills | Status: AC | PRN
Start: 2016-01-11 — End: ?

## 2016-01-11 NOTE — Telephone Encounter (Signed)
The clonazepam prescription for 12/26/15 was cancelled so they did not fill it.

## 2016-01-11 NOTE — Patient Instructions (Signed)
Benign Paroxysmal Positional Vertigo (BPPV): Care Instructions  Your Care Instructions    Benign paroxysmal positional vertigo, also called BPPV, is an inner ear problem. It causes a spinning or whirling sensation when you move your head. This sensation is called vertigo.  The vertigo usually lasts for less than a minute. People often have vertigo spells for a few days or weeks. Then the vertigo goes away. But it may come back again. The vertigo may be mild, or it may be bad enough to cause unsteadiness, nausea, and vomiting.  When you move, your inner ear sends messages to the brain. This helps you keep your balance. Vertigo can happen when debris builds up in the inner ear. The buildup can cause the inner ear to send the wrong message to the brain.  Your doctor may move you in different positions to help your vertigo get better faster. This is called the Epley maneuver. Your doctor may also prescribe medicines or exercises to help with your symptoms.  Follow-up care is a key part of your treatment and safety. Be sure to make and go to all appointments, and call your doctor if you are having problems. It's also a good idea to know your test results and keep a list of the medicines you take.  How can you care for yourself at home?  ?? If your doctor suggests that you do Brandt-Daroff exercises:  ?? Sit on the edge of a bed or sofa. Quickly lie down on the side that causes the worst vertigo. Lie on your side with your ear down.  ?? Stay in this position for at least 30 seconds or until the vertigo goes away.  ?? Sit up. If this causes vertigo, wait for it to stop.  ?? Repeat the procedure on the other side.  ?? Repeat this 10 times. Do these exercises 2 times a day until the vertigo is gone.  When should you call for help?  Call 911 anytime you think you may need emergency care. For example, call if:  ?? You have symptoms of a stroke. These may include:   ?? Sudden numbness, tingling, weakness, or loss of movement in your face, arm, or leg, especially on only one side of your body.  ?? Sudden vision changes.  ?? Sudden trouble speaking.  ?? Sudden confusion or trouble understanding simple statements.  ?? Sudden problems with walking or balance.  ?? A sudden, severe headache that is different from past headaches.  Call your doctor now or seek immediate medical care if:  ?? You have new or worse nausea and vomiting.  ?? You have new symptoms such as hearing loss or roaring in your ears.  Watch closely for changes in your health, and be sure to contact your doctor if:  ?? You are not getting better as expected.  ?? Your vertigo gets worse.  Where can you learn more?  Go to http://www.healthwise.net/GoodHelpConnections.  Enter P372 in the search box to learn more about "Benign Paroxysmal Positional Vertigo (BPPV): Care Instructions."  Current as of: April 27, 2015  Content Version: 11.2  ?? 2006-2017 [employer]. Care instructions adapted under license by [employer] (which disclaims liability or warranty for this information). If you have questions about a medical condition or this instruction, always ask your healthcare professional. [employer] disclaims any warranty or liability for your use of this information.

## 2016-01-11 NOTE — Progress Notes (Signed)
Terry Hamilton is a 82 y.o. male presenting for/with:    Blood sugar problem (Hypoglycemia for the last few weeks. )      HPI:  Diabetes.  Sugars controlled well  Hypoglycemia: frequent mild spells in the 80's to high 70's since boosting PM up to 27 units last visit due to high A1c. Better since we swapped dose as below.  Tolerating current treatment well  Current medications include januvia '100mg'$  and insulin lantus 25 units AM and 27 PM. Since spell of low sugar, we swapped to 27 units AM and 25 units PM, tol well, no further lows. Not on metformin due to bad stomach cramps and diarrhea with plain and ER metformin '500mg'$  every day.    Lab Results   Component Value Date/Time    Hemoglobin A1c 8.3 11/14/2015 03:09 PM    Hemoglobin A1c 6.8 08/15/2015 09:14 AM    Hemoglobin A1c 8.3 05/15/2015 09:08 AM    Glucose 192 11/14/2015 03:09 PM    Microalb/Creat ratio (ug/mg creat.) 479.1 11/14/2015 03:09 PM    LDL, calculated 82 08/15/2015 09:14 AM    Creatinine 1.10 11/14/2015 03:09 PM     Lab Results   Component Value Date/Time    Microalb/Creat ratio (ug/mg creat.) 479.1 11/14/2015 03:09 PM    Microalbumin, urine 394.8 11/14/2015 03:09 PM     Last eye exam performed summer 2016 with Dr. Doreene Nest, has hx of DR, mild NP.  Last foot exam 10/2015, nl MF.    CHD and Hyperlipidemia.  On zocor 20, B-blocker, ACEI, loop diuretic. Tol well. No myalgias, arthralgias, unusual weakness, but had noted decreased exercise tolerance over the past year.  Lab Results   Component Value Date/Time    Cholesterol, total 145 08/15/2015 09:14 AM    HDL Cholesterol 47 08/15/2015 09:14 AM    LDL, calculated 82 08/15/2015 09:14 AM    VLDL, calculated 16 08/15/2015 09:14 AM    Triglyceride 81 08/15/2015 09:14 AM       Lab Results   Component Value Date/Time    ALT (SGPT) 9 08/15/2015 09:14 AM    AST (SGOT) 12 08/15/2015 09:14 AM    Alk. phosphatase 62 08/15/2015 09:14 AM    Bilirubin, total 0.4 08/15/2015 09:14 AM     Hypertension.    Blood pressures have been worsening. Management at last visit included continuing current regimen . Current regimen: ACE inhibitor, beta-blocker and loop diuretic. Symptoms include tiredness/fatigue. Patient denies chest pain, palpitations, orthopnea.  Lab review:   Lab Results   Component Value Date/Time    Sodium 142 11/14/2015 03:09 PM    Potassium 4.8 11/14/2015 03:09 PM    Chloride 105 11/14/2015 03:09 PM    CO2 21 11/14/2015 03:09 PM    Glucose 192 11/14/2015 03:09 PM    BUN 19 11/14/2015 03:09 PM    Creatinine 1.10 11/14/2015 03:09 PM    BUN/Creatinine ratio 17 11/14/2015 03:09 PM    GFR est AA 73 11/14/2015 03:09 PM    GFR est non-AA 63 11/14/2015 03:09 PM    Calcium 9.0 11/14/2015 03:09 PM     PMH, SH, Medications/Allergies: reviewed, on chart.    ROS:  Constitutional: No fever, chills or weight loss  Respiratory: No cough, SOB   CV: No chest pain or Palpitations    Visit Vitals   ??? BP 138/47 (BP 1 Location: Right arm, BP Patient Position: Sitting)   ??? Pulse (!) 57   ??? Temp 97.3 ??F (36.3 ??C) (Oral)   ???  Resp 18   ??? Wt 191 lb 6.4 oz (86.8 kg)   ??? SpO2 98%   ??? BMI 29.54 kg/m2     Wt Readings from Last 3 Encounters:   01/11/16 191 lb 6.4 oz (86.8 kg)   11/14/15 191 lb (86.6 kg)   10/17/15 190 lb (86.2 kg)     BP Readings from Last 3 Encounters:   01/11/16 138/47   11/14/15 140/61   10/17/15 162/57     Physical Examination: General appearance - alert, well appearing, and in no distress  Mental status - alert, oriented to person, place, and time  Eyes - pupils equal and reactive, extraocular eye movements intact  ENT - bilateral external ears and nose normal. Normal lips  Neck - supple, no significant adenopathy, no thyromegaly or mass  Lymphatics - no palpable lymphadenopathy, no hepatosplenomegaly  Chest - clear to auscultation, no wheezes, rales or rhonchi, symmetric air entry  Heart - normal rate, regular rhythm, normal S1, S2, no murmurs, rubs, clicks or gallops   Extremities - peripheral pulses normal, no pedal edema, no clubbing or cyanosis.    A/P:  CHD and HLD  Improved DOE/fatigue. Echo reassuring, just has marked LVH, con't to work on [employer].     HTN  In goal today. Con't current tx.    Diverticulitis and constipation  Keep dietary fiber to 5 servings/day. RF amitiza.    DM2 with DR  well controlled. con't current tx. check A1c, mic/cr.    BPPV  Having some increased sx due to allergies. RF meclizine,worked well in past.    F/U [address] PRN, may be moving soon to [place], recommend recheck labs sometime this summer.

## 2016-01-11 NOTE — Addendum Note (Signed)
Addended by: Georgiann HahnBAVUSO, Willey Due K on: 01/11/2016 09:18 AM      Modules accepted: Orders

## 2016-01-23 ENCOUNTER — Emergency Department: Admit: 2016-01-23 | Payer: MEDICARE | Primary: Family Medicine

## 2016-01-23 ENCOUNTER — Inpatient Hospital Stay: Admit: 2016-01-23 | Discharge: 2016-01-23 | Disposition: A | Discharge status: Home / Self Care | Payer: MEDICARE

## 2016-01-23 DIAGNOSIS — M47816 Spondylosis without myelopathy or radiculopathy, lumbar region: Secondary | ICD-10-CM

## 2016-01-23 MED ORDER — TRAMADOL 50 MG TAB
50 mg | ORAL_TABLET | Freq: Four times a day (QID) | ORAL | 0 refills | Status: AC | PRN
Start: 2016-01-23 — End: 2016-02-02

## 2016-01-23 NOTE — ED Triage Notes (Addendum)
Right hip pain started "a while" ago - "been taking aleve nothing helps, hurts all the time" - weight bearing, denies pain radiating down leg, "just in that one spot"

## 2016-01-23 NOTE — ED Notes (Signed)
Patient discharged stated understanding of instructions - 8/10 pain - amb to exit

## 2016-01-23 NOTE — ED Provider Notes (Addendum)
Patient is a 82 y.o. male presenting with hip pain. The history is provided by the patient.   Hip Injury    This is a chronic problem. Episode onset: 10 months ago. The problem occurs constantly. Associated symptoms include back pain. Pertinent negatives include no numbness.    Pt complains of pain in his "hip," but then points to his right low back. It began last July, and has gotten worse over the last month. He says he was seen by his PCP and dx'd with arthritis and given meds without relief. He has been taking otc meds without relief, so comes in today. He denies trauma, f/c,  or new bowel or bladder problems, or LE weakness.    Past Medical History:   Diagnosis Date   ??? Abdominal pain     chronic epigastric, 6-7 years   ??? CAD (coronary artery disease)     s/p CABG, Dr Donata Duff   ??? Constipation    ??? Diverticulosis     with diverticulitis   ??? GERD (gastroesophageal reflux disease)    ??? IBS (irritable bowel syndrome)    ??? Other and unspecified hyperlipidemia    ??? Prostate cancer (HCC)     FU Dr Jeannine Kitten   ??? Type II or unspecified type diabetes mellitus without mention of complication, uncontrolled Age 62   ??? Unspecified essential hypertension        Past Surgical History:   Procedure Laterality Date   ??? HX APPENDECTOMY     ??? HX CATARACT REMOVAL      bilateral   ??? HX CHOLECYSTECTOMY     ??? HX COLONOSCOPY  2014    tics, polyp   ??? HX CORONARY ARTERY BYPASS GRAFT  2008   ??? HX CRYOABLATION OF THE PROSTATE  03/2015   ??? HX HERNIA REPAIR      bilateral   ??? HX LAPAROTOMY      for diverticulitis x3 with part of colon removed, wore colostomy for a shot period   ??? HX PROSTATECTOMY           Family History:   Problem Relation Age of Onset   ??? Diabetes Father      with amputation   ??? Diabetes Maternal Grandmother      with blindness   ??? Diabetes Brother        Social History     Social History   ??? Marital status: WIDOWED     Spouse name: N/A   ??? Number of children: N/A   ??? Years of education: N/A     Occupational History    ??? Not on file.     Social History Main Topics   ??? Smoking status: Former Smoker     Quit date: 07/20/1962   ??? Smokeless tobacco: Not on file   ??? Alcohol use No   ??? Drug use: No   ??? Sexual activity: Not on file     Other Topics Concern   ??? Military Service No   ??? Blood Transfusions Yes     2017    ??? Caffeine Concern No   ??? Occupational Exposure No   ??? Hobby Hazards No   ??? Sleep Concern No   ??? Stress Concern No   ??? Weight Concern No   ??? Special Diet Yes     diabetic   ??? Back Care Yes   ??? Exercise Yes     walking when he can if not to hot   ???  Bike Helmet No   ??? Seat Belt Yes   ??? Self-Exams Yes     Social History Narrative    Lives in [place] with his maternal aunt who is 80 yo.  Widowed since 1985.  Has 4 daughters and 3 step-sons.  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  Likes to work in the yard.           ALLERGIES: Contrast agent [iodine]; Ibuprofen; Lipitor [atorvastatin]; Metrizamide; and Zetia [ezetimibe]    Review of Systems   Constitutional: Negative.  Negative for activity change, appetite change, chills, fatigue, fever and unexpected weight change.   HENT: Negative.  Negative for congestion, hearing loss, rhinorrhea, sneezing and voice change.    Eyes: Negative.  Negative for pain and visual disturbance.   Respiratory: Negative.  Negative for apnea, cough, choking, chest tightness and shortness of breath.    Cardiovascular: Negative.  Negative for chest pain and palpitations.   Gastrointestinal: Negative.  Negative for abdominal distention, abdominal pain, blood in stool, diarrhea, nausea and vomiting.   Genitourinary: Negative.  Negative for difficulty urinating, flank pain, frequency and urgency.        Urinary incontinence, long-standing, no change   Musculoskeletal: Positive for back pain. Negative for arthralgias, myalgias and neck stiffness.   Skin: Negative.  Negative for color change and rash.    Neurological: Negative.  Negative for dizziness, seizures, syncope, speech difficulty, weakness, numbness and headaches.   Hematological: Negative for adenopathy.   Psychiatric/Behavioral: Negative.  Negative for agitation, behavioral problems, dysphoric mood and suicidal ideas. The patient is not nervous/anxious.        Vitals:    01/23/16 1113   BP: 145/64   Pulse: 60   Resp: 16   Temp: 98.2 ??F (36.8 ??C)   SpO2: 98%   Weight: 83.9 kg (185 lb)   Height: 5\' 7"  (1.702 m)            Physical Exam   Constitutional: He is oriented to person, place, and time. He appears well-developed and well-nourished. No distress.   HENT:   Head: Normocephalic and atraumatic.   Eyes: Pupils are equal, round, and reactive to light.   Neck: Normal range of motion. Neck supple.   Cardiovascular: Normal rate.    Pulmonary/Chest: Effort normal and breath sounds normal. No respiratory distress.   Abdominal: Soft. Bowel sounds are normal. He exhibits no distension. There is no tenderness. There is no rebound.   Musculoskeletal:   TTP right SI joint region, w/o swelling, deformity or mass.   Neurological: He is alert and oriented to person, place, and time. He has normal reflexes. No cranial nerve deficit. Coordination normal.   Skin: Skin is warm and dry. No rash noted. He is not diaphoretic. No erythema.   Psychiatric: He has a normal mood and affect. His behavior is normal. Judgment and thought content normal.   Nursing note and vitals reviewed.       MDM  Number of Diagnoses or Management Options  Diagnosis management comments: Right SI joint pain. Will get xray given hx of prostate CA. Will  need f/u with PCP if plain films are negative, possibly a bone scan.       Amount and/or Complexity of Data Reviewed  Tests in the radiology section of CPT??: ordered and reviewed      ED Course     01/23/2016 12:56 PM - Edi, Rad Results In   ??  Narrative    ?? LUMBAR SPINE, 5 VIEWS    INDICATION: ??Back Pain.    COMPARISON: ??None.    FINDINGS:     There is no acute fracture, spondylolysis or spondylolisthesis. The  intervertebral disc spaces are preserved. Multilevel bridging osteophytes,  largest at L3-4. Mild facet arthrosis at L5-S1. There is mild atherosclerotic  calcification of the abdominal aorta.     ??   Impression    ?? IMPRESSION:    Degenerative changes as described above.        Final result (Exam End: 01/23/2016 12:38 PM) Open    ??   Study Result    ?? PELVIS AND RIGHT HIP, 2 VIEWS  ??  INDICATION: Trauma.  ??  COMPARISON: None.  ??  FINDINGS:  ??  There is no acute fracture or dislocation. Mild bilateral hip joint space  narrowing. Bone mineralization is normal. Arterial calcification noted within  the pelvis and upper thighs.  ??  IMPRESSION  IMPRESSION:  ??  Bilateral hip DJD. No acute findings.   ??         Procedures    Findings and diagnosis d/w pt and his friend, along with the recommendation to be considered for a bone scan if symptoms persist. He was asked to see his PCP in about one week.

## 2016-02-06 ENCOUNTER — Emergency Department: Admit: 2016-02-06 | Payer: MEDICARE | Primary: Family Medicine

## 2016-02-06 ENCOUNTER — Inpatient Hospital Stay
Admit: 2016-02-06 | Discharge: 2016-02-07 | Disposition: A | Discharge status: Home / Self Care | Payer: MEDICARE | Attending: Emergency Medicine

## 2016-02-06 DIAGNOSIS — N3001 Acute cystitis with hematuria: Secondary | ICD-10-CM

## 2016-02-06 DIAGNOSIS — R109 Unspecified abdominal pain: Secondary | ICD-10-CM | POA: Diagnosis not present

## 2016-02-06 DIAGNOSIS — Z79899 Other long term (current) drug therapy: Secondary | ICD-10-CM | POA: Diagnosis not present

## 2016-02-06 DIAGNOSIS — Z794 Long term (current) use of insulin: Secondary | ICD-10-CM | POA: Diagnosis not present

## 2016-02-06 DIAGNOSIS — Z951 Presence of aortocoronary bypass graft: Secondary | ICD-10-CM | POA: Diagnosis not present

## 2016-02-06 DIAGNOSIS — I2510 Atherosclerotic heart disease of native coronary artery without angina pectoris: Secondary | ICD-10-CM | POA: Diagnosis not present

## 2016-02-06 DIAGNOSIS — Z8546 Personal history of malignant neoplasm of prostate: Secondary | ICD-10-CM | POA: Diagnosis not present

## 2016-02-06 DIAGNOSIS — Z87891 Personal history of nicotine dependence: Secondary | ICD-10-CM | POA: Diagnosis not present

## 2016-02-06 DIAGNOSIS — E119 Type 2 diabetes mellitus without complications: Secondary | ICD-10-CM | POA: Diagnosis not present

## 2016-02-06 DIAGNOSIS — I10 Essential (primary) hypertension: Secondary | ICD-10-CM | POA: Diagnosis not present

## 2016-02-06 LAB — URINE MICROSCOPIC ONLY: RBC: >100 /hpf — ABNORMAL HIGH (ref 0–5)

## 2016-02-06 LAB — URINALYSIS W/ RFLX MICROSCOPIC
Glucose: NEGATIVE mg/dL
Ketone: NEGATIVE mg/dL
Nitrites: NEGATIVE
Protein: >300 mg/dL — AB
Specific gravity: 1.025 (ref 1.003–1.030)
Urobilinogen: 1 EU/dL (ref 0.2–1.0)
pH (UA): 5.5 (ref 5.0–8.0)

## 2016-02-06 LAB — BILIRUBIN, CONFIRM: Bilirubin UA, confirm: NEGATIVE

## 2016-02-06 MED ORDER — LEVOFLOXACIN IN D5W 750 MG/150 ML IV PIGGY BACK
750 mg/150 mL | Freq: Once | INTRAVENOUS | Status: AC
Start: 2016-02-06 — End: 2016-02-06
  Administered 2016-02-06: via INTRAVENOUS

## 2016-02-06 MED FILL — LEVOFLOXACIN IN D5W 750 MG/150 ML IV PIGGY BACK: 750 mg/150 mL | INTRAVENOUS | Qty: 150

## 2016-02-06 NOTE — ED Notes (Signed)
Pt w/ blood in urine for over a week and right flank pain. Pt seen here a week ago for back pain. History of prostate CA. Pt urine is dark red tinged. Painful urination per pt. NAD. No other complaints.

## 2016-02-06 NOTE — ED Provider Notes (Signed)
HPI Comments: 82 year old male with PMHx of HTN, Type II DM, GERD, Hyperlipidemia, CAD, s/p CABG, Prostate s/p prostatectomy present with a chief complaint of right flank pain and hematuria.  Patient states that the flank pain began several weeks ago.  He began to experience experience hematuria and urinary frequency at 2:00pm today.  He denies any fever, constipation, fall, vomiting or diarrhea.      Patient is a 82 y.o. male presenting with hematuria.   Blood in Urine    Associated symptoms include frequency, hematuria and abdominal pain. Pertinent negatives include no chills, no nausea, no vomiting, no urgency and no flank pain.        Past Medical History:   Diagnosis Date   ??? Abdominal pain     chronic epigastric, 6-7 years   ??? CAD (coronary artery disease)     s/p CABG, Dr Stephanie Acre   ??? Constipation    ??? Diverticulosis     with diverticulitis   ??? GERD (gastroesophageal reflux disease)    ??? IBS (irritable bowel syndrome)    ??? Other and unspecified hyperlipidemia    ??? Prostate cancer (HCC)     FU Dr Leeanne Deed   ??? Type II or unspecified type diabetes mellitus without mention of complication, uncontrolled Age 26   ??? Unspecified essential hypertension        Past Surgical History:   Procedure Laterality Date   ??? HX APPENDECTOMY     ??? HX CATARACT REMOVAL      bilateral   ??? HX CHOLECYSTECTOMY     ??? HX COLONOSCOPY  2014    tics, polyp   ??? HX CORONARY ARTERY BYPASS GRAFT  2008   ??? HX CRYOABLATION OF THE PROSTATE  03/2015   ??? HX HERNIA REPAIR      bilateral   ??? HX LAPAROTOMY      for diverticulitis x3 with part of colon removed, wore colostomy for a shot period   ??? HX PROSTATECTOMY           Family History:   Problem Relation Age of Onset   ??? Diabetes Father      with amputation   ??? Diabetes Maternal Grandmother      with blindness   ??? Diabetes Brother        Social History     Social History   ??? Marital status: WIDOWED     Spouse name: N/A   ??? Number of children: N/A   ??? Years of education: N/A     Occupational History    ??? Not on file.     Social History Main Topics   ??? Smoking status: Former Smoker     Quit date: 07/20/1962   ??? Smokeless tobacco: Not on file   ??? Alcohol use No   ??? Drug use: No   ??? Sexual activity: Not on file     Other Topics Concern   ??? Military Service No   ??? Blood Transfusions Yes     2017    ??? Caffeine Concern No   ??? Occupational Exposure No   ??? Hobby Hazards No   ??? Sleep Concern No   ??? Stress Concern No   ??? Weight Concern No   ??? Special Diet Yes     diabetic   ??? Back Care Yes   ??? Exercise Yes     walking when he can if not to hot   ??? Bike Helmet No   ???  Seat Belt Yes   ??? Self-Exams Yes     Social History Narrative    Lives in [place] with his maternal aunt who is 46 yo.  Widowed since 1985.  Has 4 daughters and 3 step-sons.  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  Likes to work in the yard.           ALLERGIES: Contrast agent [iodine]; Ibuprofen; Lipitor [atorvastatin]; Metrizamide; and Zetia [ezetimibe]    Review of Systems   Constitutional: Negative for appetite change, chills, diaphoresis, fatigue, fever and unexpected weight change.   HENT: Negative for congestion, dental problem, ear discharge, ear pain, hearing loss, nosebleeds, rhinorrhea, sinus pressure, sore throat, tinnitus, trouble swallowing and voice change.    Eyes: Negative for photophobia, pain, discharge, redness and visual disturbance.   Respiratory: Negative for cough, choking, chest tightness, shortness of breath, wheezing and stridor.    Cardiovascular: Negative for chest pain, palpitations and leg swelling.   Gastrointestinal: Positive for abdominal pain. Negative for abdominal distention, anal bleeding, blood in stool, constipation, diarrhea, nausea and vomiting.        Right flank pain.   Genitourinary: Positive for frequency and hematuria. Negative for decreased urine volume, difficulty urinating, discharge, dysuria, flank pain, genital sores, penile pain, penile swelling, scrotal swelling,  testicular pain and urgency.   Musculoskeletal: Negative for neck pain and neck stiffness.   Skin: Negative for pallor, rash and wound.   Neurological: Negative for tremors, seizures, syncope, speech difficulty, weakness, light-headedness and headaches.   Hematological: Negative for adenopathy. Does not bruise/bleed easily.   Psychiatric/Behavioral: Negative for agitation, behavioral problems, confusion, hallucinations, self-injury, sleep disturbance and suicidal ideas. The patient is not nervous/anxious and is not hyperactive.        Vitals:    02/06/16 1803 02/06/16 2001   BP: 177/78 158/74   Pulse: (!) 59 64   Resp: 20 16   Temp: 97.8 ??F (36.6 ??C) 98.6 ??F (37 ??C)   SpO2: 97%    Weight: 83.9 kg (185 lb)    Height: _0  (1.702 m)             Physical Exam   Constitutional: He is oriented to person, place, and time. He appears well-developed and well-nourished. He appears distressed.   82 year old male in moderate painful distress.    HENT:   Head: Normocephalic and atraumatic.   Right Ear: External ear normal.   Left Ear: External ear normal.   Nose: Nose normal.   Mouth/Throat: Oropharynx is clear and moist. No oropharyngeal exudate.   Eyes: Conjunctivae and EOM are normal. Pupils are equal, round, and reactive to light. Right eye exhibits no discharge. Left eye exhibits no discharge. No scleral icterus.   Neck: Normal range of motion. Neck supple. No JVD present. No tracheal deviation present. No thyromegaly present.   Cardiovascular: Normal rate, regular rhythm, normal heart sounds and intact distal pulses.  Exam reveals no gallop and no friction rub.    No murmur heard.  Pulmonary/Chest: Effort normal and breath sounds normal. No stridor. No respiratory distress. He has no wheezes. He has no rales. He exhibits no tenderness.   Abdominal: Soft. Bowel sounds are normal. He exhibits no distension and no mass. There is tenderness (mild supra-pubic tenderness). There is no rebound and no guarding.    Genitourinary: Penis normal.   Genitourinary Comments: Uncircimcised.   Musculoskeletal: Normal range of motion. He exhibits no edema or tenderness.  Right CVA tenderness   Lymphadenopathy:     He has no cervical adenopathy.   Neurological: He is alert and oriented to person, place, and time. He has normal reflexes. No cranial nerve deficit. He exhibits normal muscle tone. Coordination normal.   Skin: Skin is warm and dry. No rash noted. He is not diaphoretic. No erythema. No pallor.   Psychiatric: He has a normal mood and affect. His behavior is normal. Judgment and thought content normal.   Nursing note and vitals reviewed.       MDM  Number of Diagnoses or Management Options  Diagnosis management comments: Differential includes: UTI, Pyelonephritis, Renal Colic, Ureteral stone.       Amount and/or Complexity of Data Reviewed  Clinical lab tests: ordered and reviewed  Tests in the radiology section of CPT??: ordered and reviewed  Tests in the medicine section of CPT??: ordered and reviewed  Obtain history from someone other than the patient: yes  Review and summarize past medical records: yes  Independent visualization of images, tracings, or specimens: yes    Risk of Complications, Morbidity, and/or Mortality  Presenting problems: high  Diagnostic procedures: high  Management options: high    Patient Progress  Patient progress: improved    ED Course       Procedures      Vitals:  Patient Vitals for the past 12 hrs:   Temp Pulse Resp BP SpO2   02/06/16 2001 98.6 ??F (37 ??C) 64 16 158/74 -   02/06/16 1803 97.8 ??F (36.6 ??C) (!) 59 20 177/78 97 %         Medications ordered:   Medications   levoFLOXacin (LEVAQUIN) 750 mg in D5W IVPB (0 mg IntraVENous IV Completed 02/06/16 2128)         Lab findings:  Recent Results (from the past 12 hour(s))   URINALYSIS W/ RFLX MICROSCOPIC    Collection Time: 02/06/16  6:30 PM   Result Value Ref Range    Color RED      Appearance TURBID (A) CLEAR       Specific gravity 1.025 1.003 - 1.030      pH (UA) 5.5 5.0 - 8.0      Protein >300 (A) NEG mg/dL    Glucose NEGATIVE  NEG mg/dL    Ketone NEGATIVE  NEG mg/dL    Bilirubin SMALL (A) NEG      Blood LARGE (A) NEG      Urobilinogen 1.0 0.2 - 1.0 EU/dL    Nitrites NEGATIVE  NEG      Leukocyte Esterase LARGE (A) NEG     BILIRUBIN, CONFIRM    Collection Time: 02/06/16  6:30 PM   Result Value Ref Range    Bilirubin UA, confirm NEGATIVE  NEG     URINE MICROSCOPIC ONLY    Collection Time: 02/06/16  6:30 PM   Result Value Ref Range    WBC 50-100 0 - 4 /hpf    RBC >100 (H) 0 - 5 /hpf    Epithelial cells FEW FEW /lpf    Bacteria 2+ (A) NEG /hpf    Amorphous Crystals 1+ (A) NEG   CBC WITH AUTOMATED DIFF    Collection Time: 02/06/16  7:00 PM   Result Value Ref Range    WBC 9.4 4.1 - 11.1 K/uL    RBC 5.02 4.10 - 5.70 M/uL    HGB 12.2 12.1 - 17.0 g/dL    HCT 38.4 36.6 - 50.3 %  MCV 76.5 (L) 80.0 - 99.0 FL    MCH 24.3 (L) 26.0 - 34.0 PG    MCHC 31.8 30.0 - 36.5 g/dL    RDW 20.8 (H) 11.5 - 14.5 %    PLATELET 229 150 - 400 K/uL    NEUTROPHILS 68 32 - 75 %    LYMPHOCYTES 20 12 - 49 %    MONOCYTES 9 5 - 13 %    EOSINOPHILS 3 0 - 7 %    BASOPHILS 0 0 - 1 %    ABS. NEUTROPHILS 6.4 1.8 - 8.0 K/UL    ABS. LYMPHOCYTES 1.9 0.8 - 3.5 K/UL    ABS. MONOCYTES 0.8 0.0 - 1.0 K/UL    ABS. EOSINOPHILS 0.3 0.0 - 0.4 K/UL    ABS. BASOPHILS 0.0 0.0 - 0.1 K/UL    DF SMEAR SCANNED      RBC COMMENTS ANISOCYTOSIS  2+       METABOLIC PANEL, BASIC    Collection Time: 02/06/16  7:00 PM   Result Value Ref Range    Sodium 144 136 - 145 mmol/L    Potassium 3.8 3.5 - 5.1 mmol/L    Chloride 107 97 - 108 mmol/L    CO2 23 21 - 32 mmol/L    Anion gap 14 5 - 15 mmol/L    Glucose 156 (H) 65 - 100 mg/dL    BUN 26 (H) 7.0 - 18.0 MG/DL    Creatinine 1.40 (H) 0.70 - 1.30 MG/DL    BUN/Creatinine ratio 19 12 - 20      GFR est AA 59 (L) >60 ml/min/1.14m    GFR est non-AA 49 (L) >60 ml/min/1.764m   Calcium 9.0 8.5 - 10.1 MG/DL   HEPATIC FUNCTION PANEL     Collection Time: 02/06/16  7:15 PM   Result Value Ref Range    Protein, total 7.7 6.4 - 8.2 g/dL    Albumin 3.6 3.5 - 5.0 g/dL    Globulin 4.1 (H) 2.0 - 4.0 g/dL    A-G Ratio 0.9 (L) 1.1 - 2.2      Bilirubin, total 0.6 0.2 - 1.0 MG/DL    Bilirubin, direct 0.1 0.0 - 0.2 MG/DL    Alk. phosphatase 83 45 - 117 U/L    AST (SGOT) 38 (H) 15 - 37 U/L    ALT (SGPT) 23 14 - 63 U/L         X-Ray, CT or other radiology findings or impressions:  CT ABD PELV WO CONT   Final Result   FINDINGS:   LUNG BASES: Calcified granuloma right middle lobe with atelectasis at the lung  bases  INCIDENTALLY IMAGED HEART AND MEDIASTINUM: Heart is enlarged. There are coronary  artery calcifications  LIVER: No mass or biliary dilatation.  GALLBLADDER: Unremarkable.  SPLEEN: No mass.  PANCREAS: No mass or ductal dilatation.  ADRENALS: Unremarkable.  KIDNEYS/URETERS: No mass, calculus, or hydronephrosis.  STOMACH: Unremarkable.  SMALL BOWEL: No dilatation or wall thickening.  COLON: Scattered diverticula. No evidence of acute diverticulitis. Sigmoid colon  has been partially resected  APPENDIX: Surgically absent  PERITONEUM: No ascites or pneumoperitoneum.  RETROPERITONEUM: No lymphadenopathy or aortic aneurysm. Aorta is partially  atherosclerotic  REPRODUCTIVE ORGANS: Prostate is not enlarged  URINARY BLADDER: No mass or calculus.  BONES: No destructive bone lesion.  ADDITIONAL COMMENTS: N/A  ??  IMPRESSION  IMPRESSION:  No acute abnormality       Progress notes, Consult notes or additional Procedure notes:   9:45  PM Upon re-evaluation the patient's symptoms have improved. Pt has non-toxic appearance and condition is stable for discharge. He was informed of his results, instructed to f/u with PCP and return to the ED upon worsening of symptoms. All questions and concerns were addressed.        Disposition:  Diagnosis:   1. Cystitis, acute hemorrhagic        Disposition: Discharge home    Follow-up Information      Follow up With Details Comments [place], MD Schedule an appointment as soon as possible for a visit in 1 day Return to the ED, If symptoms worsen [place] [address]  [phone]             Patient's Medications   Start Taking    LEVOFLOXACIN (LEVAQUIN) 500 MG TABLET    Take 1 Tab by mouth daily for 7 days.   Continue Taking    ACCU-CHEK SMARTVIEW TEST STRIP STRIP    USE AS DIRECTED    AMITIZA 24 MCG CAPSULE    Take 1 Cap by mouth daily (with breakfast).    ASPIRIN DELAYED-RELEASE 81 MG TABLET    Take 1 Tab by mouth daily. Indications: heart    CHLORTHALIDONE (HYGROTEN) 25 MG TABLET    Take 1 Tab by mouth daily. Indications: heart, replaces furosemide    CLONAZEPAM (KLONOPIN) 0.5 MG TABLET    TAKE 1 TABLET EVERY NIGHT AS NEEDED    DOCUSATE SODIUM (COLACE) 100 MG CAPSULE    Take 1 Cap by mouth two (2) times a day.    INSULIN GLARGINE (LANTUS SOLOSTAR) 100 UNIT/ML (3 ML) PEN    25 units AM and 27 units QPM.    MECLIZINE (ANTIVERT) 25 MG TABLET    Take 1 Tab by mouth three (3) times daily as needed. Indications: VERTIGO    METOPROLOL TARTRATE (LOPRESSOR) 50 MG TABLET    TAKE 1 TABLET TWICE DAILY    NITROGLYCERIN (NITROSTAT) 0.4 MG SL TABLET    1 Tab by SubLINGual route every five (5) minutes as needed for Chest Pain for up to 3 doses.    NOVOFINE 32 32 GAUGE X 1/4" NDLE        OMEPRAZOLE (PRILOSEC) 20 MG CAPSULE    TAKE 1 CAPSULE EVERY DAY    PEN NEEDLE, DIABETIC (NOVOFINE PLUS) 32 GAUGE X 1/6" NDLE    1 Pen Needle by SubCUTAneous route two (2) times a day. Use With Lantus Solostar Pen To Inject Insulin.    RAMIPRIL (ALTACE) 10 MG CAPSULE    TAKE 1 CAPSULE EVERY DAY    SIMVASTATIN (ZOCOR) 20 MG TABLET    TAKE 1 TABLET EVERY NIGHT    SITAGLIPTIN (JANUVIA) 100 MG TABLET    TAKE ONE TABLET BY MOUTH ONCE DAILY for sugar    SITAGLIPTIN (JANUVIA) 100 MG TABLET    Take 1 Tab by mouth daily.   These Medications have changed    No medications on file   Stop Taking     No medications on file

## 2016-02-06 NOTE — ED Triage Notes (Signed)
Patient complains of blood in his urine that started today.   Patient also complains of Right hip pain that started about 1400 this afternoon.  Denies trauma, amb to triage.   Complains of dysuria.  Complains of constant sharp pain in hip patient states he was seen here two weeks ago for same pain in his hip.  Per patient the pain has never went away.

## 2016-02-06 NOTE — ED Notes (Signed)
Pt ok for d/c. Verbalized understanding of d/c instructions to include prescriptions and follow up.

## 2016-02-07 LAB — CBC WITH AUTOMATED DIFF
ABS. BASOPHILS: 0 10*3/uL (ref 0.0–0.1)
ABS. EOSINOPHILS: 0.3 10*3/uL (ref 0.0–0.4)
ABS. LYMPHOCYTES: 1.9 10*3/uL (ref 0.8–3.5)
ABS. MONOCYTES: 0.8 10*3/uL (ref 0.0–1.0)
ABS. NEUTROPHILS: 6.4 10*3/uL (ref 1.8–8.0)
BASOPHILS: 0 % (ref 0–1)
EOSINOPHILS: 3 % (ref 0–7)
HCT: 38.4 % (ref 36.6–50.3)
HGB: 12.2 g/dL (ref 12.1–17.0)
LYMPHOCYTES: 20 % (ref 12–49)
MCH: 24.3 PG — ABNORMAL LOW (ref 26.0–34.0)
MCHC: 31.8 g/dL (ref 30.0–36.5)
MCV: 76.5 FL — ABNORMAL LOW (ref 80.0–99.0)
MONOCYTES: 9 % (ref 5–13)
NEUTROPHILS: 68 % (ref 32–75)
PLATELET: 229 10*3/uL (ref 150–400)
RBC: 5.02 M/uL (ref 4.10–5.70)
RDW: 20.8 % — ABNORMAL HIGH (ref 11.5–14.5)
WBC: 9.4 10*3/uL (ref 4.1–11.1)

## 2016-02-07 LAB — HEPATIC FUNCTION PANEL
A-G Ratio: 0.9 — ABNORMAL LOW (ref 1.1–2.2)
ALT (SGPT): 23 U/L (ref 14–63)
AST (SGOT): 38 U/L — ABNORMAL HIGH (ref 15–37)
Albumin: 3.6 g/dL (ref 3.5–5.0)
Alk. phosphatase: 83 U/L (ref 45–117)
Bilirubin, direct: 0.1 MG/DL (ref 0.0–0.2)
Bilirubin, total: 0.6 MG/DL (ref 0.2–1.0)
Globulin: 4.1 g/dL — ABNORMAL HIGH (ref 2.0–4.0)
Protein, total: 7.7 g/dL (ref 6.4–8.2)

## 2016-02-07 LAB — METABOLIC PANEL, BASIC
Anion gap: 14 mmol/L (ref 5–15)
BUN/Creatinine ratio: 19 (ref 12–20)
BUN: 26 MG/DL — ABNORMAL HIGH (ref 7.0–18.0)
CO2: 23 mmol/L (ref 21–32)
Calcium: 9 MG/DL (ref 8.5–10.1)
Chloride: 107 mmol/L (ref 97–108)
Creatinine: 1.4 MG/DL — ABNORMAL HIGH (ref 0.70–1.30)
GFR est AA: 59 mL/min/{1.73_m2} — ABNORMAL LOW (ref >60)
GFR est non-AA: 49 mL/min/{1.73_m2} — ABNORMAL LOW (ref >60)
Glucose: 156 mg/dL — ABNORMAL HIGH (ref 65–100)
Potassium: 3.8 mmol/L (ref 3.5–5.1)
Sodium: 144 mmol/L (ref 136–145)

## 2016-02-07 MED ORDER — TRAMADOL 50 MG TAB
50 mg | ORAL_TABLET | Freq: Four times a day (QID) | ORAL | 0 refills | Status: AC | PRN
Start: 2016-02-07 — End: ?

## 2016-02-07 MED ORDER — LEVOFLOXACIN 500 MG TAB
500 mg | ORAL_TABLET | Freq: Every day | ORAL | 0 refills | Status: AC
Start: 2016-02-07 — End: 2016-02-13

## 2016-02-08 LAB — CULTURE, URINE
Colonies Counted: >100000
Colony Count: >100000

## 2016-02-11 ENCOUNTER — Ambulatory Visit: Admit: 2016-02-11 | Discharge: 2016-02-11 | Payer: MEDICARE | Attending: Family Medicine | Primary: Family Medicine

## 2016-02-11 DIAGNOSIS — Z0000 Encounter for general adult medical examination without abnormal findings: Secondary | ICD-10-CM

## 2016-02-11 DIAGNOSIS — E11319 Type 2 diabetes mellitus with unspecified diabetic retinopathy without macular edema: Secondary | ICD-10-CM | POA: Diagnosis not present

## 2016-02-11 DIAGNOSIS — Z794 Long term (current) use of insulin: Secondary | ICD-10-CM | POA: Diagnosis not present

## 2016-02-11 DIAGNOSIS — Z7189 Other specified counseling: Secondary | ICD-10-CM | POA: Diagnosis not present

## 2016-02-11 DIAGNOSIS — I10 Essential (primary) hypertension: Secondary | ICD-10-CM | POA: Diagnosis not present

## 2016-02-11 DIAGNOSIS — K589 Irritable bowel syndrome without diarrhea: Secondary | ICD-10-CM | POA: Diagnosis not present

## 2016-02-11 DIAGNOSIS — Z1389 Encounter for screening for other disorder: Secondary | ICD-10-CM | POA: Diagnosis not present

## 2016-02-11 MED ORDER — SITAGLIPTIN 100 MG TAB
100 mg | ORAL_TABLET | ORAL | 3 refills | Status: AC
Start: 2016-02-11 — End: ?

## 2016-02-11 MED ORDER — SITAGLIPTIN 100 MG TAB
100 mg | ORAL_TABLET | Freq: Every day | ORAL | 0 refills | Status: AC
Start: 2016-02-11 — End: ?

## 2016-02-11 MED ORDER — AMITIZA 24 MCG CAPSULE
24 mcg | ORAL_CAPSULE | Freq: Every day | ORAL | 3 refills | Status: AC
Start: 2016-02-11 — End: ?

## 2016-02-11 NOTE — ACP (Advance Care Planning) (Signed)
Discussed with patient.

## 2016-02-11 NOTE — Patient Instructions (Addendum)
Change insulin lantus to 28 units AM and 24 units PM.    Schedule of Personalized Health Plan  (Provide Copy to Patient)  The best way to stay healthy is to live a healthy lifestyle. A healthy lifestyle includes regular exercise, eating a well-balanced diet, keeping a healthy weight and not smoking.    Regular physical exams and screening tests are another important way to take care of yourself. Preventive exams provided by health care providers can find health problems early when treatment works best and can keep you from getting certain diseases or illnesses. Preventive services include exams, lab tests, screenings, shots, monitoring and information to help you take care of your own health.    All people over 65 should have a pneumonia shot. Pneumonia shots are usually only needed once in a lifetime unless your doctor decides differently.     All people over 65 should have a yearly flu shot.    People over 65 are at medium to high risk for Hepatitis B. Three shots are needed for complete protection.  In addition to your physical exam, some screening tests are recommended:    Bone mass measurement (dexa scan) is recommended every two years if you have certain risk factors, such as personal history of vertebral fracture or chronic steroid medication use    Diabetes Mellitus screening is recommended every year.    Glaucoma is an eye disease caused by high pressure in the eye.  An eye exam is recommended every year.     Cardiovascular screening tests that check your cholesterol and other blood fat (lipid) levels are recommended every five years.     Colorectal Cancer screening tests help to find pre-cancerous polyps (growths in the colon) so they can be removed before they turn into cancer.  Tests ordered for screening depend on your personal and family history risk factors.    Screening for Prostate Cancer is recommended yearly with a digital rectal exam and/or a PSA test     Here is a list of your current Health Maintenance items with a due date:  Health Maintenance   Topic Date Due   ??? DTaP/Tdap/Td series (1 - Tdap) 03/01/1956   ??? ZOSTER VACCINE AGE 6>  03/02/1995   ??? Pneumococcal 65+ High/Highest Risk (2 of 2 - PPSV23) 10/10/2015   ??? MEDICARE YEARLY EXAM  01/31/2016   ??? INFLUENZA AGE 28 TO ADULT  04/29/2016   ??? HEMOGLOBIN A1C Q6M  05/13/2016   ??? LIPID PANEL Q1  08/14/2016   ??? FOOT EXAM Q1  11/13/2016   ??? MICROALBUMIN Q1  11/13/2016   ??? EYE EXAM RETINAL OR DILATED Q1  12/18/2016   ??? GLAUCOMA SCREENING Q2Y  12/18/2017       If you have any questions regarding mychart, you may call mychart support at [phone].

## 2016-02-11 NOTE — Progress Notes (Signed)
Terry Hamilton is a 82 y.o. male and presents for annual Medicare Wellness Visit.    Pt had spell of dysuria, urgency, hematuria 02/06/16. Got pretty ill feeling. Went to [facility] ED for eval, had abd discomfort. Had CT Abd which was benign, only showing incidental findings of ASCVD, and a UA c/w cystitis. Nl looking prostate on scan. Tx with levaquin with good results. Feeling pretty much back to normal now.    Diabetes.  Sugars controlled well  Hypoglycemia: con't to have some mild hypoglycemia from 70-90's still.   Tolerating current treatment well  Current medications include januvia 100mg  and insulin lantus 27 units AM and 25 units PM, tol well, no further lows.    Problem List: Reviewed with patient and discussed risk factors.    Patient Active Problem List   Diagnosis Code   ??? Hyperlipemia E78.5   ??? Essential hypertension I10   ??? DM (diabetes mellitus) (HCC) E11.9   ??? GERD (gastroesophageal reflux disease) K21.9   ??? Diverticulosis K57.90   ??? Constipation K59.00   ??? CAD (coronary artery disease) I25.10   ??? IBS (irritable bowel syndrome) K58.9   ??? Prostate cancer (HCC) C61       Current medical providers:  Patient Care Team:  Georgiann HahnSalvatore K Areon Cocuzza, MD as PCP - General (Family Practice)    PSH: Reviewed with patient  Past Surgical History:   Procedure Laterality Date   ??? HX APPENDECTOMY     ??? HX CATARACT REMOVAL      bilateral   ??? HX CHOLECYSTECTOMY     ??? HX COLONOSCOPY  2014    tics, polyp   ??? HX CORONARY ARTERY BYPASS GRAFT  2008   ??? HX CRYOABLATION OF THE PROSTATE  03/2015   ??? HX HERNIA REPAIR      bilateral   ??? HX LAPAROTOMY      for diverticulitis x3 with part of colon removed, wore colostomy for a shot period   ??? HX PROSTATECTOMY          SH: Reviewed with patient  Social History   Substance Use Topics   ??? Smoking status: Former Smoker     Quit date: 07/20/1962   ??? Smokeless tobacco: None   ??? Alcohol use No       FH: Reviewed with patient  Family History   Problem Relation Age of Onset   ??? Diabetes Father       with amputation   ??? Diabetes Maternal Grandmother      with blindness   ??? Diabetes Brother        Medications/Allergies: Reviewed with patient  Current Outpatient Prescriptions on File Prior to Visit   Medication Sig Dispense Refill   ??? traMADol (ULTRAM) 50 mg tablet Take 1 Tab by mouth every six (6) hours as needed for Pain. Max Daily Amount: 200 mg. 20 Tab 0   ??? clonazePAM (KLONOPIN) 0.5 mg tablet TAKE 1 TABLET EVERY NIGHT AS NEEDED 90 Tab 1   ??? insulin glargine (LANTUS SOLOSTAR) 100 unit/mL (3 mL) pen 25 units AM and 27 units QPM. 3 Each 3   ??? NOVOFINE 32 32 gauge x 1/4" ndle      ??? aspirin delayed-release 81 mg tablet Take 1 Tab by mouth daily. Indications: heart 100 Tab 3   ??? ramipril (ALTACE) 10 mg capsule TAKE 1 CAPSULE EVERY DAY 90 Cap 1   ??? omeprazole (PRILOSEC) 20 mg capsule TAKE 1 CAPSULE EVERY DAY 90 Cap 1   ???  metoprolol tartrate (LOPRESSOR) 50 mg tablet TAKE 1 TABLET TWICE DAILY 180 Tab 1   ??? simvastatin (ZOCOR) 20 mg tablet TAKE 1 TABLET EVERY NIGHT 90 Tab 1   ??? pen needle, diabetic (NOVOFINE PLUS) 32 gauge x 1/6" ndle 1 Pen Needle by SubCUTAneous route two (2) times a day. Use With Lantus Solostar Pen To Inject Insulin. 100 Pen Needle 3   ??? levoFLOXacin (LEVAQUIN) 500 mg tablet Take 1 Tab by mouth daily for 7 days. 7 Tab 0   ??? meclizine (ANTIVERT) 25 mg tablet Take 1 Tab by mouth three (3) times daily as needed. Indications: VERTIGO 30 Tab 3   ??? nitroglycerin (NITROSTAT) 0.4 mg SL tablet 1 Tab by SubLINGual route every five (5) minutes as needed for Chest Pain for up to 3 doses. 20 Tab 1   ??? chlorthalidone (HYGROTEN) 25 mg tablet Take 1 Tab by mouth daily. Indications: heart, replaces furosemide 90 Tab 3   ??? ACCU-CHEK SMARTVIEW TEST STRIP strip USE AS DIRECTED 300 Strip 3   ??? docusate sodium (COLACE) 100 mg capsule Take 1 Cap by mouth two (2) times a day. 60 Cap 5     No current facility-administered medications on file prior to visit.       Allergies   Allergen Reactions    ??? Contrast Agent [Iodine] Rash   ??? Ibuprofen Other (comments)     GI upset   ??? Lipitor [Atorvastatin] Myalgia   ??? Metrizamide Other (comments)   ??? Zetia [Ezetimibe] Other (comments)     Had trouble with his eyes that improved upon stopping this med       Objective:  Visit Vitals   ??? BP 114/58 (BP 1 Location: Right arm, BP Patient Position: Sitting)   ??? Pulse 62   ??? Temp 99 ??F (37.2 ??C) (Oral)   ??? Resp 16   ??? Ht 5\' 7"  (1.702 m)   ??? Wt 191 lb (86.6 kg)   ??? SpO2 98%   ??? BMI 29.91 kg/m2    Body mass index is 29.91 kg/(m^2).    Assessment of cognitive impairment: Alert and oriented x 3    Depression Screen:   PHQ over the last two weeks 02/11/2016   PHQ Not Done -   Little interest or pleasure in doing things Not at all   Feeling down, depressed or hopeless Not at all   Total Score PHQ 2 0       Fall Risk Assessment:    Fall Risk Assessment, last 12 mths 02/11/2016   Able to walk? Yes   Fall in past 12 months? No       Functional Ability:   Does the patient exhibit a steady gait?  yes   How long did it take the patient to get up and walk from a sitting position? 3s   Is the patient self reliant?  (ie can do own laundry, meals, household chores)      yes     Does the patient handle his/her own medications?  yes     Does the patient handle his/her own money?   yes     Is the patient???s home safe (ie good lighting, handrails on stairs and bath, etc.)?   yes     Did you notice or did patient express any hearing difficulties?   yes   Did you notice or did patient express any vision difficulties?   yes     Were distance and reading eye charts used?  no       Advance Care Planning:   Patient was offered the opportunity to discuss advance care planning:  yes   Does patient have an Advance Directive:  no   If no, did you provide information on Caring Connections?  yes       Plan:      DM2  Still having mild lows. Change to insulin lantus 28 units AM and 24 units PM. Plans to get recheck labs later this summer at his new place in  [place]. [place] with samples and mail ore.    Constipation  W/c on amitiza. RF with mail ore.    Orders Placed This Encounter   ??? Depression Screen Annual   ??? SITagliptin (JANUVIA) 100 mg tablet   ??? AMITIZA 24 mcg capsule   ??? SITagliptin (JANUVIA) 100 mg tablet       Health Maintenance   Topic Date Due   ??? DTaP/Tdap/Td series (1 - Tdap) 03/01/1956   ??? ZOSTER VACCINE AGE 53>  03/02/1995   ??? MEDICARE YEARLY EXAM  01/31/2016   ??? Pneumococcal 65+ High/Highest Risk (2 of 2 - PPSV23) 08/19/2016 (Originally 10/10/2015)   ??? INFLUENZA AGE 42 TO ADULT  04/29/2016   ??? HEMOGLOBIN A1C Q6M  05/13/2016   ??? LIPID PANEL Q1  08/14/2016   ??? FOOT EXAM Q1  11/13/2016   ??? MICROALBUMIN Q1  11/13/2016   ??? EYE EXAM RETINAL OR DILATED Q1  12/18/2016   ??? GLAUCOMA SCREENING Q2Y  12/18/2017       *Patient verbalized understanding and agreement with the plan.  A copy of the After Visit Summary with personalized health plan was given to the patient today.

## 2016-02-18 ENCOUNTER — Encounter

## 2016-02-18 MED ORDER — OMEPRAZOLE 20 MG CAP, DELAYED RELEASE
20 mg | ORAL_CAPSULE | ORAL | 3 refills | Status: DC
Start: 2016-02-18 — End: 2017-01-02

## 2016-02-18 MED ORDER — SIMVASTATIN 20 MG TAB
20 mg | ORAL_TABLET | ORAL | 3 refills | Status: DC
Start: 2016-02-18 — End: 2017-01-02

## 2016-02-18 MED ORDER — RAMIPRIL 10 MG CAP
10 mg | ORAL_CAPSULE | ORAL | 3 refills | Status: DC
Start: 2016-02-18 — End: 2017-01-05

## 2016-02-18 MED ORDER — INSULIN GLARGINE 100 UNIT/ML (3 ML) SUB-Q PEN
100 unit/mL (3 mL) | PEN_INJECTOR | Freq: Two times a day (BID) | SUBCUTANEOUS | 3 refills | Status: AC
Start: 2016-02-18 — End: ?

## 2016-02-18 MED ORDER — METOPROLOL TARTRATE 50 MG TAB
50 mg | ORAL_TABLET | ORAL | 3 refills | Status: DC
Start: 2016-02-18 — End: 2017-03-04

## 2016-02-18 NOTE — Telephone Encounter (Signed)
This pt requesting FLAVOXATE, 100 mg tabs, last prescribed by Dr. Ralph Leydenoyle 10/01/2015

## 2016-02-19 NOTE — Telephone Encounter (Signed)
I was trying to do this request for this patient on 02/18/16 when I was working at [employer] [place] [employer]. Can some one please contact this patient regarding the discontinuation of this medication and Dr. Willaim ShengBavuso's reply? Thank you.

## 2016-02-21 MED ORDER — FLAVOXATE 100 MG TAB
100 mg | ORAL_TABLET | Freq: Two times a day (BID) | ORAL | 1 refills | Status: AC
Start: 2016-02-21 — End: ?

## 2016-02-26 DIAGNOSIS — B954 Other streptococcus as the cause of diseases classified elsewhere: Secondary | ICD-10-CM | POA: Diagnosis not present

## 2016-02-26 DIAGNOSIS — R339 Retention of urine, unspecified: Secondary | ICD-10-CM | POA: Diagnosis not present

## 2016-02-26 DIAGNOSIS — B9689 Other specified bacterial agents as the cause of diseases classified elsewhere: Secondary | ICD-10-CM | POA: Diagnosis not present

## 2016-02-26 DIAGNOSIS — N390 Urinary tract infection, site not specified: Secondary | ICD-10-CM | POA: Diagnosis not present

## 2016-02-26 DIAGNOSIS — R319 Hematuria, unspecified: Secondary | ICD-10-CM | POA: Diagnosis not present

## 2016-02-26 DIAGNOSIS — R3911 Hesitancy of micturition: Secondary | ICD-10-CM | POA: Diagnosis not present

## 2016-02-29 DIAGNOSIS — R339 Retention of urine, unspecified: Secondary | ICD-10-CM | POA: Diagnosis not present

## 2016-02-29 DIAGNOSIS — R319 Hematuria, unspecified: Secondary | ICD-10-CM | POA: Diagnosis not present

## 2016-02-29 DIAGNOSIS — N390 Urinary tract infection, site not specified: Secondary | ICD-10-CM | POA: Diagnosis not present

## 2016-03-01 DIAGNOSIS — I10 Essential (primary) hypertension: Secondary | ICD-10-CM | POA: Diagnosis not present

## 2016-03-01 DIAGNOSIS — Z91041 Radiographic dye allergy status: Secondary | ICD-10-CM | POA: Diagnosis not present

## 2016-03-01 DIAGNOSIS — E785 Hyperlipidemia, unspecified: Secondary | ICD-10-CM | POA: Diagnosis not present

## 2016-03-01 DIAGNOSIS — Z8546 Personal history of malignant neoplasm of prostate: Secondary | ICD-10-CM | POA: Diagnosis not present

## 2016-03-01 DIAGNOSIS — E119 Type 2 diabetes mellitus without complications: Secondary | ICD-10-CM | POA: Diagnosis not present

## 2016-03-01 DIAGNOSIS — Z951 Presence of aortocoronary bypass graft: Secondary | ICD-10-CM | POA: Diagnosis not present

## 2016-03-01 DIAGNOSIS — R339 Retention of urine, unspecified: Secondary | ICD-10-CM | POA: Diagnosis not present

## 2016-03-01 DIAGNOSIS — K589 Irritable bowel syndrome without diarrhea: Secondary | ICD-10-CM | POA: Diagnosis not present

## 2016-03-01 DIAGNOSIS — N390 Urinary tract infection, site not specified: Secondary | ICD-10-CM | POA: Diagnosis not present

## 2016-03-04 DIAGNOSIS — Z01810 Encounter for preprocedural cardiovascular examination: Secondary | ICD-10-CM | POA: Diagnosis not present

## 2016-03-04 DIAGNOSIS — I4510 Unspecified right bundle-branch block: Secondary | ICD-10-CM | POA: Diagnosis not present

## 2016-03-04 DIAGNOSIS — R350 Frequency of micturition: Secondary | ICD-10-CM | POA: Diagnosis not present

## 2016-03-04 DIAGNOSIS — C61 Malignant neoplasm of prostate: Secondary | ICD-10-CM | POA: Diagnosis not present

## 2016-03-04 DIAGNOSIS — R339 Retention of urine, unspecified: Secondary | ICD-10-CM | POA: Diagnosis not present

## 2016-03-04 DIAGNOSIS — R319 Hematuria, unspecified: Secondary | ICD-10-CM | POA: Diagnosis not present

## 2016-03-04 DIAGNOSIS — R3911 Hesitancy of micturition: Secondary | ICD-10-CM | POA: Diagnosis not present

## 2016-03-04 DIAGNOSIS — N390 Urinary tract infection, site not specified: Secondary | ICD-10-CM | POA: Diagnosis not present

## 2016-03-10 ENCOUNTER — Emergency Department (HOSPITAL_COMMUNITY)
Admission: EM | Admit: 2016-03-10 | Discharge: 2016-03-10 | Disposition: A | Discharge status: Home / Self Care | Payer: Medicare PPO | Attending: Emergency Medicine | Admitting: Emergency Medicine

## 2016-03-10 ENCOUNTER — Encounter (HOSPITAL_COMMUNITY): Payer: Self-pay | Admitting: Emergency Medicine

## 2016-03-10 DIAGNOSIS — I10 Essential (primary) hypertension: Secondary | ICD-10-CM | POA: Insufficient documentation

## 2016-03-10 DIAGNOSIS — Y732 Prosthetic and other implants, materials and accessory gastroenterology and urology devices associated with adverse incidents: Secondary | ICD-10-CM | POA: Insufficient documentation

## 2016-03-10 DIAGNOSIS — Z87891 Personal history of nicotine dependence: Secondary | ICD-10-CM | POA: Insufficient documentation

## 2016-03-10 DIAGNOSIS — E119 Type 2 diabetes mellitus without complications: Secondary | ICD-10-CM | POA: Insufficient documentation

## 2016-03-10 DIAGNOSIS — T83098A Other mechanical complication of other indwelling urethral catheter, initial encounter: Secondary | ICD-10-CM | POA: Insufficient documentation

## 2016-03-10 DIAGNOSIS — N390 Urinary tract infection, site not specified: Secondary | ICD-10-CM | POA: Diagnosis not present

## 2016-03-10 DIAGNOSIS — T839XXA Unspecified complication of genitourinary prosthetic device, implant and graft, initial encounter: Secondary | ICD-10-CM

## 2016-03-10 DIAGNOSIS — Z8546 Personal history of malignant neoplasm of prostate: Secondary | ICD-10-CM | POA: Insufficient documentation

## 2016-03-10 DIAGNOSIS — I2510 Atherosclerotic heart disease of native coronary artery without angina pectoris: Secondary | ICD-10-CM | POA: Diagnosis not present

## 2016-03-10 HISTORY — DX: Type 2 diabetes mellitus without complications: E11.9

## 2016-03-10 HISTORY — DX: Malignant neoplasm of prostate: C61

## 2016-03-10 HISTORY — DX: Atherosclerotic heart disease of native coronary artery without angina pectoris: I25.10

## 2016-03-10 HISTORY — DX: Essential (primary) hypertension: I10

## 2016-03-10 HISTORY — DX: Diverticulitis of intestine, part unspecified, without perforation or abscess without bleeding: K57.92

## 2016-03-10 HISTORY — DX: Pure hypercholesterolemia, unspecified: E78.00

## 2016-03-10 LAB — URINALYSIS, ROUTINE W REFLEX MICROSCOPIC
Bilirubin Urine: NEGATIVE
Glucose, UA: NEGATIVE mg/dL
Ketones, ur: NEGATIVE mg/dL
Nitrite: NEGATIVE
Protein, ur: NEGATIVE mg/dL
Specific Gravity, Urine: 1.011 (ref 1.005–1.030)
pH: 5.5 (ref 5.0–8.0)

## 2016-03-10 LAB — URINE MICROSCOPIC-ADD ON

## 2016-03-10 LAB — I-STAT CHEM 8, ED
BUN: 26 mg/dL — ABNORMAL HIGH (ref 6–20)
Calcium, Ion: 1.25 mmol/L (ref 1.13–1.30)
Chloride: 108 mmol/L (ref 101–111)
Creatinine, Ser: 1.2 mg/dL (ref 0.61–1.24)
Glucose, Bld: 139 mg/dL — ABNORMAL HIGH (ref 65–99)
HCT: 37 % — ABNORMAL LOW (ref 39.0–52.0)
Hemoglobin: 12.6 g/dL — ABNORMAL LOW (ref 13.0–17.0)
Potassium: 4.6 mmol/L (ref 3.5–5.1)
Sodium: 141 mmol/L (ref 135–145)
TCO2: 22 mmol/L (ref 0–100)

## 2016-03-10 MED ORDER — CIPROFLOXACIN HCL 500 MG PO TABS
500.0000 mg | ORAL_TABLET | Freq: Once | ORAL | Status: AC
  Administered 2016-03-10: 500 mg via ORAL
  Filled 2016-03-10: qty 1

## 2016-03-10 MED ORDER — CIPROFLOXACIN HCL 500 MG PO TABS: 500.0000 mg | ORAL_TABLET | Freq: Two times a day (BID) | ORAL | Status: DC

## 2016-03-10 NOTE — ED Notes (Signed)
Attempted to flush foley per request of MD.  Flushes well however urine immediately come back from meatus around catheter.  Physician and nurse made aware.

## 2016-03-10 NOTE — ED Notes (Signed)
Pt. reports his foley catheter is no draining / clogged this evening with mild bladder pressure .

## 2016-03-10 NOTE — ED Notes (Signed)
Pt states he understands instructions. Home stable with family. 

## 2016-03-10 NOTE — ED Provider Notes (Signed)
CSN: [id number]     Arrival date & time 03/10/16  0347 History   First MD Initiated Contact with Patient 03/10/16 0543     No chief complaint on file.    (Consider location/radiation/quality/duration/timing/severity/associated sxs/prior Treatment) HPI  This is an 82 year old male with history of prostate cancer, hypertension, diabetes, coronary artery disease who presents with a clogged Foley. Patient states that he had a Foley catheter placed on June 3 by his urologist for urinary retention. He is scheduled for cystoscopy on June 21. He recently moved from [place] and his primary urologist is in [place]. At approximately 12 AM he noted decreased output from the Foley catheter. He reports mild bladder pressure but no abdominal pain or vomiting. Does have a history of recent UTI and has finished a course of antibiotics. Denies fever or back pain.  Past Medical History  Diagnosis Date  . Prostate cancer ([place])   . Hypertension   . Diabetes mellitus without complication ([place])   . Coronary artery disease   . Hypercholesteremia   . Diverticulitis    Past Surgical History  Procedure Laterality Date  . Abdominal surgery    . Coronary artery bypass graft    . Hernia repair    . Prostate surgery     No family history on file. Social History  Substance Use Topics  . Smoking status: Former Research scientist (life sciences)  . Smokeless tobacco: None  . Alcohol Use: No    Review of Systems  Constitutional: Negative for fever.  Gastrointestinal: Negative for nausea, vomiting and abdominal pain.  Genitourinary: Negative for flank pain.       Clogged Foley  All other systems reviewed and are negative.     Allergies  Dye fdc red and Ibuprofen  Home Medications   Prior to Admission medications   Medication Sig Start Date End Date Taking? Authorizing Provider  ciprofloxacin (CIPRO) 500 MG tablet Take 1 tablet (500 mg total) by mouth 2 (two) times daily. 03/10/16   Merryl Hacker, MD   BP 142/72 mmHg   Pulse 56  Temp(Src) 97.6 F (36.4 C) (Oral)  Resp 16  Ht 5' 7.5" (1.715 m)  Wt 190 lb (86.183 kg)  BMI 29.30 kg/m2  SpO2 98% Physical Exam  Constitutional: He is oriented to person, place, and time. He appears well-developed and well-nourished. No distress.  HENT:  Head: Normocephalic and atraumatic.  Cardiovascular: Normal rate, regular rhythm and normal heart sounds.   No murmur heard. Pulmonary/Chest: Effort normal and breath sounds normal. No respiratory distress. He has no wheezes.  Genitourinary: Penis normal.  Foley catheter in place, no drainage noted in the tubing  Musculoskeletal: He exhibits no edema.  Neurological: He is alert and oriented to person, place, and time.  Skin: Skin is warm and dry.  Psychiatric: He has a normal mood and affect.  Nursing note and vitals reviewed.   ED Course  Procedures (including critical care time) Labs Review Labs Reviewed  URINALYSIS, ROUTINE W REFLEX MICROSCOPIC (NOT AT [facility]) - Abnormal; Notable for the following:    APPearance CLOUDY (*)    Hgb urine dipstick MODERATE (*)    Leukocytes, UA LARGE (*)    All other components within normal limits  URINE MICROSCOPIC-ADD ON - Abnormal; Notable for the following:    Squamous Epithelial / LPF 0-5 (*)    Bacteria, UA MANY (*)    All other components within normal limits  I-STAT CHEM 8, ED - Abnormal; Notable for the following:  BUN 26 (*)    Glucose, Bld 139 (*)    Hemoglobin 12.6 (*)    HCT 37.0 (*)    All other components within normal limits  URINE CULTURE    Imaging Review No results found. I have personally reviewed and evaluated these images and lab results as part of my medical decision-making.   EKG Interpretation None      MDM   Final diagnoses:  Complication of Foley catheter, initial encounter [facility])  UTI (lower urinary tract infection)    Patient presents reporting clogged foley catheter.  Follows with urology in [place].  He is nontoxic appearing.   Afebrile.  Foley was unable to be flushed. When removed, purulence was noted at the end of the catheter. Urinalysis with TNC WBCs and bacteria.  Urine culture sent.  No signs or symptoms of sepsis or pyelonephritis.  No AKI.  GIven indwelling catheter, with d/c on Cipro with close urology follow-up.  Patient was given strict return precautions including fever, back pain.    After history, exam, and medical workup I feel the patient has been appropriately medically screened and is safe for discharge home. Pertinent diagnoses were discussed with the patient. Patient was given return precautions.     Merryl Hacker, MD 03/10/16 1020

## 2016-03-10 NOTE — Discharge Instructions (Signed)
You were seen today for Foley catheter obstruction. You were also noted to have a urinary tract infection. He has a history of urinary tract infections. He will be placed on an antibiotic. You need follow-up with your urologist as soon as possible. If you develop back pain, fevers or any new or worsening symptoms you need be reevaluated immediately.  Foley Catheter Care, Adult A Foley catheter is a soft, flexible tube that is placed into the bladder to drain urine. A Foley catheter may be inserted if:  You leak urine or are not able to control when you urinate (urinary incontinence).  You are not able to urinate when you need to (urinary retention).  You had prostate surgery or surgery on the genitals.  You have certain medical conditions, such as multiple sclerosis, dementia, or a spinal cord injury. If you are going home with a Foley catheter in place, follow the instructions below. TAKING CARE OF THE CATHETER 1. Wash your hands with soap and water. 2. Using mild soap and warm water on a clean washcloth:  Clean the area on your body closest to the catheter insertion site using a circular motion, moving away from the catheter. Never wipe toward the catheter because this could sweep bacteria up into the urethra and cause infection.  Remove all traces of soap. Pat the area dry with a clean towel. For males, reposition the foreskin. 3. Attach the catheter to your leg so there is no tension on the catheter. Use adhesive tape or a leg strap. If you are using adhesive tape, remove any sticky residue left behind by the previous tape you used. 4. Keep the drainage bag below the level of the bladder, but keep it off the floor. 5. Check throughout the day to be sure the catheter is working and urine is draining freely. Make sure the tubing does not become kinked. 6. Do not pull on the catheter or try to remove it. Pulling could damage internal tissues. TAKING CARE OF THE DRAINAGE BAGS You will be  given two drainage bags to take home. One is a large overnight drainage bag, and the other is a smaller leg bag that fits underneath clothing. You may wear the overnight bag at any time, but you should never wear the smaller leg bag at night. Follow the instructions below for how to empty, change, and clean your drainage bags. Emptying the Drainage Bag You must empty your drainage bag when it is  - full or at least 2-3 times a day. 1. Wash your hands with soap and water. 2. Keep the drainage bag below your hips, below the level of your bladder. This stops urine from going back into the tubing and into your bladder. 3. Hold the dirty bag over the toilet or a clean container. 4. Open the pour spout at the bottom of the bag and empty the urine into the toilet or container. Do not let the pour spout touch the toilet, container, or any other surface. Doing so can place bacteria on the bag, which can cause an infection. 5. Clean the pour spout with a gauze pad or cotton ball that has rubbing alcohol on it. 6. Close the pour spout. 7. Attach the bag to your leg with adhesive tape or a leg strap. 8. Wash your hands well. Changing the Drainage Bag Change your drainage bag once a month or sooner if it starts to smell bad or look dirty. Below are steps to follow when changing the drainage bag.  1. Wash your hands with soap and water. 2. Pinch off the rubber catheter so that urine does not spill out. 3. Disconnect the catheter tube from the drainage tube at the connection valve. Do not let the tubes touch any surface. 4. Clean the end of the catheter tube with an alcohol wipe. Use a different alcohol wipe to clean the end of the drainage tube. 5. Connect the catheter tube to the drainage tube of the clean drainage bag. 6. Attach the new bag to the leg with adhesive tape or a leg strap. Avoid attaching the new bag too tightly. 7. Wash your hands well. Cleaning the Drainage Bag 1. Wash your hands with soap  and water. 2. Wash the bag in warm, soapy water. 3. Rinse the bag thoroughly with warm water. 4. Fill the bag with a solution of white vinegar and water (1 cup vinegar to 1 qt warm water [.2 L vinegar to 1 L warm water]). Close the bag and soak it for 30 minutes in the solution. 5. Rinse the bag with warm water. 6. Hang the bag to dry with the pour spout open and hanging downward. 7. Store the clean bag (once it is dry) in a clean plastic bag. 8. Wash your hands well. PREVENTING INFECTION  Wash your hands before and after handling your catheter.  Take showers daily and wash the area where the catheter enters your body. Do not take baths. Replace wet leg straps with dry ones, if this applies.  Do not use powders, sprays, or lotions on the genital area. Only use creams, lotions, or ointments as directed by your caregiver.  For females, wipe from front to back after each bowel movement.  Drink enough fluids to keep your urine clear or pale yellow unless you have a fluid restriction.  Do not let the drainage bag or tubing touch or lie on the floor.  Wear cotton underwear to absorb moisture and to keep your skin drier. SEEK MEDICAL CARE IF:   Your urine is cloudy or smells unusually bad.  Your catheter becomes clogged.  You are not draining urine into the bag or your bladder feels full.  Your catheter starts to leak. SEEK IMMEDIATE MEDICAL CARE IF:   You have pain, swelling, redness, or pus where the catheter enters the body.  You have pain in the abdomen, legs, lower back, or bladder.  You have a fever.  You see blood fill the catheter, or your urine is pink or red.  You have nausea, vomiting, or chills.  Your catheter gets pulled out. MAKE SURE YOU:   Understand these instructions.  Will watch your condition.  Will get help right away if you are not doing well or get worse.   This information is not intended to replace advice given to you by your health care  provider. Make sure you discuss any questions you have with your health care provider.   Document Released: 09/15/2005 Document Revised: 01/30/2014 Document Reviewed: 09/06/2012 Elsevier Interactive Patient Education 2016 [place] Urinary Tract Infection FAQs What is "catheter-associated urinary tract infection"? A urinary tract infection (also called "UTI") is an infection in the urinary system, which includes the bladder (which stores the urine) and the kidneys (which filter the blood to make urine). Germs (for example, bacteria or yeasts) do not normally live in these areas; but if germs are introduced, an infection can occur. If you have a urinary catheter, germs can travel along the catheter and cause  an infection in your bladder or your kidney; in that case it is called a catheter-associated urinary tract infection (or "CA-UTI").  What is a urinary catheter? A urinary catheter is a thin tube placed in the bladder to drain urine. Urine drains through the tube into a bag that collects the urine. A urinary catheter may be used:  If you are not able to urinate on your own  To measure the amount of urine that you make, for example, during intensive care  During and after some types of surgery  During some tests of the kidneys and bladder People with urinary catheters have a much higher chance of getting a urinary tract infection than people who don't have a catheter. How do I get a catheter-associated urinary tract infection (CA-UTI)? If germs enter the urinary tract, they may cause an infection. Many of the germs that cause a catheter-associated urinary tract infection are common germs found in your intestines that do not usually cause an infection there. Germs can enter the urinary tract when the catheter is being put in or while the catheter remains in the bladder.  What are the symptoms of a urinary tract infection? Some of the common symptoms of a urinary  tract infection are: 7. Burning or pain in the lower abdomen (that is, below the stomach) 8. Fever 9. Bloody urine may be a sign of infection, but is also caused by other problems 10. Burning during urination or an increase in the frequency of urination after the catheter is removed. Sometimes people with catheter-associated urinary tract infections do not have these symptoms of infection. Can catheter-associated urinary tract infections be treated? Yes, most catheter-associated urinary tract infections can be treated with antibiotics and removal or change of the catheter. Your doctor will determine which antibiotic is best for you.  What are some of the things that hospitals are doing to prevent catheter-associated urinary tract infections? To prevent urinary tract infections, doctors and nurses take the following actions.  Catheter insertion 9. External catheters in men (these look like condoms and are placed over the penis rather than into the penis) 10. Putting a temporary catheter in to drain the urine and removing it right away. This is called intermittent urethral catheterization. Catheter care What can I do to help prevent catheter-associated urinary tract infections if I have a catheter? 9. Always clean your hands before and after doing catheter care. 10. Always keep your urine bag below the level of your bladder. 11. Do not tug or pull on the tubing. 12. Do not twist or kink the catheter tubing. 76. Ask your healthcare provider each day if you still need the catheter. What do I need to do when I go home from the hospital?  If you will be going home with a catheter, your doctor or nurse should explain everything you need to know about taking care of the catheter. Make sure you understand how to care for it before you leave the hospital.  If you develop any of the symptoms of a urinary tract infection, such as burning or pain in the lower abdomen, fever, or an increase in the  frequency of urination, contact your doctor or nurse immediately.  Before you go home, make sure you know who to contact if you have questions or problems after you get home. If you have questions, please ask your doctor or nurse. Developed and co-sponsored by [employer] for [place] [phone]); Infectious Diseases Society of [place] (IDSA); [facility]  Association; Association for Professionals in Infection Control and Epidemiology (APIC); Centers for Disease Control and Prevention (CDC); and [employer].   This information is not intended to replace advice given to you by your health care provider. Make sure you discuss any questions you have with your health care provider.   Document Released: 06/09/2012 Document Revised: 01/30/2015 Document Reviewed: 11/29/2014 Elsevier Interactive Patient Education [employer].

## 2016-03-12 ENCOUNTER — Emergency Department (HOSPITAL_COMMUNITY)
Admission: EM | Admit: 2016-03-12 | Discharge: 2016-03-12 | Disposition: A | Discharge status: Home / Self Care | Payer: Medicare PPO | Attending: Emergency Medicine | Admitting: Emergency Medicine

## 2016-03-12 ENCOUNTER — Encounter (HOSPITAL_COMMUNITY): Payer: Self-pay | Admitting: Emergency Medicine

## 2016-03-12 DIAGNOSIS — E119 Type 2 diabetes mellitus without complications: Secondary | ICD-10-CM | POA: Insufficient documentation

## 2016-03-12 DIAGNOSIS — T83038D Leakage of other indwelling urethral catheter, subsequent encounter: Secondary | ICD-10-CM | POA: Insufficient documentation

## 2016-03-12 DIAGNOSIS — Z955 Presence of coronary angioplasty implant and graft: Secondary | ICD-10-CM | POA: Insufficient documentation

## 2016-03-12 DIAGNOSIS — N390 Urinary tract infection, site not specified: Secondary | ICD-10-CM | POA: Diagnosis not present

## 2016-03-12 DIAGNOSIS — T83098D Other mechanical complication of other indwelling urethral catheter, subsequent encounter: Secondary | ICD-10-CM | POA: Diagnosis not present

## 2016-03-12 DIAGNOSIS — I10 Essential (primary) hypertension: Secondary | ICD-10-CM | POA: Diagnosis not present

## 2016-03-12 DIAGNOSIS — Y69 Unspecified misadventure during surgical and medical care: Secondary | ICD-10-CM | POA: Diagnosis not present

## 2016-03-12 DIAGNOSIS — Z8546 Personal history of malignant neoplasm of prostate: Secondary | ICD-10-CM | POA: Diagnosis not present

## 2016-03-12 DIAGNOSIS — I2510 Atherosclerotic heart disease of native coronary artery without angina pectoris: Secondary | ICD-10-CM | POA: Diagnosis not present

## 2016-03-12 DIAGNOSIS — Z87891 Personal history of nicotine dependence: Secondary | ICD-10-CM | POA: Insufficient documentation

## 2016-03-12 DIAGNOSIS — Z79899 Other long term (current) drug therapy: Secondary | ICD-10-CM | POA: Diagnosis not present

## 2016-03-12 DIAGNOSIS — T839XXD Unspecified complication of genitourinary prosthetic device, implant and graft, subsequent encounter: Secondary | ICD-10-CM

## 2016-03-12 HISTORY — DX: Urinary tract infection, site not specified: N39.0

## 2016-03-12 LAB — URINALYSIS, ROUTINE W REFLEX MICROSCOPIC
Bilirubin Urine: NEGATIVE
Glucose, UA: NEGATIVE mg/dL
Ketones, ur: NEGATIVE mg/dL
Nitrite: NEGATIVE
Protein, ur: NEGATIVE mg/dL
Specific Gravity, Urine: 1.006 (ref 1.005–1.030)
pH: 6 (ref 5.0–8.0)

## 2016-03-12 LAB — BASIC METABOLIC PANEL
Anion gap: 5 (ref 5–15)
BUN: 24 mg/dL — ABNORMAL HIGH (ref 6–20)
CO2: 24 mmol/L (ref 22–32)
Calcium: 9.4 mg/dL (ref 8.9–10.3)
Chloride: 109 mmol/L (ref 101–111)
Creatinine, Ser: 1.14 mg/dL (ref 0.61–1.24)
GFR calc Af Amer: >60 mL/min (ref >60)
GFR calc non Af Amer: 58 mL/min — ABNORMAL LOW (ref >60)
Glucose, Bld: 102 mg/dL — ABNORMAL HIGH (ref 65–99)
Potassium: 4.5 mmol/L (ref 3.5–5.1)
Sodium: 138 mmol/L (ref 135–145)

## 2016-03-12 LAB — CBC WITH DIFFERENTIAL/PLATELET
Basophils Absolute: 0 10*3/uL (ref 0.0–0.1)
Basophils Relative: 0 %
Eosinophils Absolute: 0.2 10*3/uL (ref 0.0–0.7)
Eosinophils Relative: 2 %
HCT: 36.5 % — ABNORMAL LOW (ref 39.0–52.0)
Hemoglobin: 11.5 g/dL — ABNORMAL LOW (ref 13.0–17.0)
Lymphocytes Relative: 21 %
Lymphs Abs: 1.9 10*3/uL (ref 0.7–4.0)
MCH: 24.8 pg — ABNORMAL LOW (ref 26.0–34.0)
MCHC: 31.5 g/dL (ref 30.0–36.0)
MCV: 78.8 fL (ref 78.0–100.0)
Monocytes Absolute: 0.6 10*3/uL (ref 0.1–1.0)
Monocytes Relative: 7 %
Neutro Abs: 6.5 10*3/uL (ref 1.7–7.7)
Neutrophils Relative %: 70 %
Platelets: 213 10*3/uL (ref 150–400)
RBC: 4.63 MIL/uL (ref 4.22–5.81)
RDW: 17.5 % — ABNORMAL HIGH (ref 11.5–15.5)
WBC: 9.3 10*3/uL (ref 4.0–10.5)

## 2016-03-12 LAB — URINE MICROSCOPIC-ADD ON

## 2016-03-12 NOTE — ED Notes (Signed)
Pt states he was seen in the ED on Monday for foley/leg bag leaking and states it is leaking again.  Reports he has lower back pain and was taking antibiotic for UTI but urologist told him to stop the antibiotic.

## 2016-03-12 NOTE — Discharge Instructions (Signed)
Catheter-Associated Urinary Tract Infection FAQs  What is "catheter-associated urinary tract infection"?  A urinary tract infection (also called "UTI") is an infection in the urinary system, which includes the bladder (which stores the urine) and the kidneys (which filter the blood to make urine). Germs (for example, bacteria or yeasts) do not normally live in these areas; but if germs are introduced, an infection can occur.  If you have a urinary catheter, germs can travel along the catheter and cause an infection in your bladder or your kidney; in that case it is called a catheter-associated urinary tract infection (or "CA-UTI").   What is a urinary catheter?  A urinary catheter is a thin tube placed in the bladder to drain urine. Urine drains through the tube into a bag that collects the urine. A urinary catheter may be used:  · If you are not able to urinate on your own  · To measure the amount of urine that you make, for example, during intensive care  · During and after some types of surgery  · During some tests of the kidneys and bladder  People with urinary catheters have a much higher chance of getting a urinary tract infection than people who don't have a catheter.  How do I get a catheter-associated urinary tract infection (CA-UTI)?  If germs enter the urinary tract, they may cause an infection. Many of the germs that cause a catheter-associated urinary tract infection are common germs found in your intestines that do not usually cause an infection there. Germs can enter the urinary tract when the catheter is being put in or while the catheter remains in the bladder.   What are the symptoms of a urinary tract infection?  Some of the common symptoms of a urinary tract infection are:  · Burning or pain in the lower abdomen (that is, below the stomach)  · Fever  · Bloody urine may be a sign of infection, but is also caused by other problems  · Burning during urination or an increase in the frequency of  urination after the catheter is removed.  Sometimes people with catheter-associated urinary tract infections do not have these symptoms of infection.  Can catheter-associated urinary tract infections be treated?  Yes, most catheter-associated urinary tract infections can be treated with antibiotics and removal or change of the catheter. Your doctor will determine which antibiotic is best for you.   What are some of the things that hospitals are doing to prevent catheter-associated urinary tract infections?  To prevent urinary tract infections, doctors and nurses take the following actions.   Catheter insertion  · External catheters in men (these look like condoms and are placed over the penis rather than into the penis)  · Putting a temporary catheter in to drain the urine and removing it right away. This is called intermittent urethral catheterization.  Catheter care  What can I do to help prevent catheter-associated urinary tract infections if I have a catheter?  · Always clean your hands before and after doing catheter care.  · Always keep your urine bag below the level of your bladder.  · Do not tug or pull on the tubing.  · Do not twist or kink the catheter tubing.  · Ask your healthcare provider each day if you still need the catheter.  What do I need to do when I go home from the hospital?  · If you will be going home with a catheter, your doctor or nurse should explain everything   tract infection, such as burning or pain in the lower abdomen, fever, or an increase in the frequency of urination, contact your doctor or nurse immediately.  Before you go home, make sure you know who to contact if you have questions or problems after you get home. If you have questions, please ask your doctor or nurse. Developed and co-sponsored by [employer] for [place] [phone]); Infectious Diseases Society of [place] (IDSA); [place]; Association for Professionals in Infection Control and Epidemiology (APIC); Centers for Disease Control and Prevention (CDC); and [employer].   This information is not intended to replace advice given to you by your health care provider. Make sure you discuss any questions you have with your health care provider.   Document Released: 06/09/2012 Document Revised: 01/30/2015 Document Reviewed: 11/29/2014 Elsevier Interactive Patient Education 2016 [employer]  Urinary Tract Infection Urinary tract infections (UTIs) can develop anywhere along your urinary tract. Your urinary tract is your body's drainage system for removing wastes and extra water. Your urinary tract includes two kidneys, two ureters, a bladder, and a urethra. Your kidneys are a pair of bean-shaped organs. Each kidney is about the size of your fist. They are located below your ribs, one on each side of your spine. CAUSES Infections are caused by microbes, which are microscopic organisms, including fungi, viruses, and bacteria. These organisms are so small that they can only be seen through a microscope. Bacteria are the microbes that most commonly cause UTIs. SYMPTOMS  Symptoms of UTIs may vary by age and gender of the patient and by the location of the infection. Symptoms in young women typically include a frequent and intense urge to urinate and a painful, burning feeling in the bladder or urethra during urination. Older women and men are more likely to be tired, shaky, and weak and have muscle aches and abdominal pain. A fever may mean the infection is in your kidneys. Other symptoms of a kidney infection include pain in your back or sides below the ribs, nausea, and vomiting. DIAGNOSIS To diagnose a UTI, your caregiver will ask you about your symptoms. Your caregiver will also ask you to  provide a urine sample. The urine sample will be tested for bacteria and white blood cells. White blood cells are made by your body to help fight infection. TREATMENT  Typically, UTIs can be treated with medication. Because most UTIs are caused by a bacterial infection, they usually can be treated with the use of antibiotics. The choice of antibiotic and length of treatment depend on your symptoms and the type of bacteria causing your infection. HOME CARE INSTRUCTIONS  If you were prescribed antibiotics, take them exactly as your caregiver instructs you. Finish the medication even if you feel better after you have only taken some of the medication.  Drink enough water and fluids to keep your urine clear or pale yellow.  Avoid caffeine, tea, and carbonated beverages. They tend to irritate your bladder.  Empty your bladder often. Avoid holding urine for long periods of time.  Empty your bladder before and after sexual intercourse.  After a bowel movement, women should cleanse from front to back. Use each tissue only once. SEEK MEDICAL CARE IF:   You have back pain.  You develop a fever.  Your symptoms do not begin to resolve within 3 days. SEEK IMMEDIATE MEDICAL CARE IF:   You have severe back pain or lower abdominal pain.  You develop chills.  You have  nausea or vomiting.  You have continued burning or discomfort with urination. MAKE SURE YOU:   Understand these instructions.  Will watch your condition.  Will get help right away if you are not doing well or get worse.   This information is not intended to replace advice given to you by your health care provider. Make sure you discuss any questions you have with your health care provider.   Document Released: 06/25/2005 Document Revised: 06/06/2015 Document Reviewed: 10/24/2011 Elsevier Interactive Patient Education [employer].

## 2016-03-12 NOTE — ED Provider Notes (Signed)
CSN: [id number]     Arrival date & time 03/12/16  1741 History   First MD Initiated Contact with Patient 03/12/16 2018     Chief Complaint  Patient presents with  . foley leaking      (Consider location/radiation/quality/duration/timing/severity/associated sxs/prior Treatment) HPI   Patient is a 82 year old male with history of prostate cancer, hypertension, diabetes, CAD, HLD, he is currently managed by a urologist in [place] however has moved permanently to [place] area he presents to the ER because of obstructed indwelling Foley which is not draining into his leg bag since 5 PM today. He states that he had leakage of urine around the catheter, abdominal pain described as pressure and distention, rated 4 out of 10 and left flank pain also rated 4 out of 10.  He was seen in the ER 2 days ago for similar complaint, was found to have purulence including Foley which was replaced in the ER and he was discharged on Cipro.  He called his urologist in [place] who instructed him to only take 2 doses of Cipro and then to stop.  He states that June 21 he is scheduled for a cystoscopy and photovaporization of the prostate in [place].  He reports diagnosis of prostate cancer 30 years ago which was treated with radiation and a recurrence nearly one year ago in July where he was treated with "freezing it."  He has had an indwelling catheter ever since.  The patient denies fevers, chills, sweats, nausea, vomiting.  After arrival to the ER this evening, he did begin to drain urine into his leg bag which filled to capacity, and since having draining again has not longer had leaking from the foley around his penis.  He did have purulent discharge last night, and the same today in the ER.  He has a burning pain when the urine drains.  He is on flomax.  He wishes to get a local urologist.  He has no other complaints including no chest pain, shortness of breath, lightheadedness, confusion.  Past Medical History    Diagnosis Date  . Prostate cancer ([place])   . Hypertension   . Diabetes mellitus without complication ([place])   . Coronary artery disease   . Hypercholesteremia   . Diverticulitis   . UTI (lower urinary tract infection)    Past Surgical History  Procedure Laterality Date  . Abdominal surgery    . Coronary artery bypass graft    . Hernia repair    . Prostate surgery     No family history on file. Social History  Substance Use Topics  . Smoking status: Former Research scientist (life sciences)  . Smokeless tobacco: None  . Alcohol Use: No    Review of Systems  All other systems reviewed and are negative.     Allergies  Dye fdc red and Ibuprofen  Home Medications   Prior to Admission medications   Medication Sig Start Date End Date Taking? Authorizing Provider  ciprofloxacin (CIPRO) 500 MG tablet Take 1 tablet (500 mg total) by mouth 2 (two) times daily. 03/10/16   Merryl Hacker, MD   BP 138/61 mmHg  Pulse 53  Temp(Src) 97.8 F (36.6 C) (Oral)  Resp 16  SpO2 97% Physical Exam  Constitutional: He is oriented to person, place, and time. He appears well-developed and well-nourished. No distress.  HENT:  Head: Normocephalic and atraumatic.  Nose: Nose normal.  Mouth/Throat: Oropharynx is clear and moist. No oropharyngeal exudate.  Eyes: Conjunctivae and EOM are normal. Pupils are  equal, round, and reactive to light. Right eye exhibits no discharge. Left eye exhibits no discharge. No scleral icterus.  Neck: Normal range of motion. No JVD present. No tracheal deviation present. No thyromegaly present.  Cardiovascular: Normal rate, regular rhythm, normal heart sounds and intact distal pulses.  Exam reveals no gallop and no friction rub.   No murmur heard. Pulmonary/Chest: Effort normal and breath sounds normal. No respiratory distress. He has no wheezes. He has no rales. He exhibits no tenderness.  Abdominal: Soft. Normal appearance and bowel sounds are normal. He exhibits no distension and no  mass. There is tenderness. There is no rigidity, no rebound, no guarding and no CVA tenderness.    No CVA tenderness bilaterally Protuberant abdomen, nondistended, normal bowel sounds 4, mild tenderness to palpation right mid abdomen, no suprapubic tenderness, no guarding, no rebound  Musculoskeletal: Normal range of motion. He exhibits no edema or tenderness.  Lymphadenopathy:    He has no cervical adenopathy.  Neurological: He is alert and oriented to person, place, and time. He has normal reflexes. No cranial nerve deficit. He exhibits normal muscle tone. Coordination normal.  Skin: Skin is warm and dry. No rash noted. He is not diaphoretic. No erythema. No pallor.  Psychiatric: He has a normal mood and affect. His behavior is normal. Judgment and thought content normal.  Nursing note and vitals reviewed.   ED Course  Procedures (including critical care time) Labs Review Labs Reviewed  URINALYSIS, ROUTINE W REFLEX MICROSCOPIC (NOT AT [facility]) - Abnormal; Notable for the following:    APPearance HAZY (*)    Hgb urine dipstick SMALL (*)    Leukocytes, UA LARGE (*)    All other components within normal limits  BASIC METABOLIC PANEL - Abnormal; Notable for the following:    Glucose, Bld 102 (*)    BUN 24 (*)    GFR calc non Af Amer 58 (*)    All other components within normal limits  CBC WITH DIFFERENTIAL/PLATELET - Abnormal; Notable for the following:    Hemoglobin 11.5 (*)    HCT 36.5 (*)    MCH 24.8 (*)    RDW 17.5 (*)    All other components within normal limits  URINE MICROSCOPIC-ADD ON - Abnormal; Notable for the following:    Squamous Epithelial / LPF 0-5 (*)    Bacteria, UA RARE (*)    All other components within normal limits    Imaging Review No results found. I have personally reviewed and evaluated these images and lab results as part of my medical decision-making.   EKG Interpretation None      MDM    Patient presents reporting clogged foley catheter.  Follows with urology in [place]. He is nontoxic appearing. Afebrile. In the ER Foley spontaneously began to drain and patient had no more leakage of urine, improved abdominal pressure. Repeated urinalysis obtaining urine directly from Foley, UA pertinent for small blood, large leukocytes and rare bacteria. Urine culture from 2 days ago reviewed which showed no growth and was reintubated, no C&S resulted yet. Basic labs obtained show no aching, no leukocytosis, No signs or symptoms of sepsis or pyelonephritis.Patient was encouraged to restart, taking one dose tonight, and continue for the next three days (at least) until we have culture and sensitivity results. Given his permanent relocation he was given on-call urology follow-up, [facility], Dr. Diona Fanti. Patient was given strict return precautions including fever, back pain, N, V.  the patient has a small leg bag which  has filled to capacity multiple times while in the ER, he was given a larger Foley bag, care and cleaning reviewed with family and pt.  Will contact pt's pharmacy to see if more supplied can be ordered by an ER provider.  After history, exam, and medical workup I feel the patient has been appropriately medically screened and is safe for discharge home. Pertinent diagnoses were discussed with the patient. Patient was given return precautions.  Final diagnoses:  Complication of Foley catheter, subsequent encounter  UTI (lower urinary tract infection)      Delsa Grana, PA-C 03/12/16 2301  Julianne Rice, MD 03/13/16 0009

## 2016-03-13 LAB — URINE CULTURE

## 2016-03-18 DIAGNOSIS — K5900 Constipation, unspecified: Secondary | ICD-10-CM | POA: Diagnosis not present

## 2016-03-18 DIAGNOSIS — I2510 Atherosclerotic heart disease of native coronary artery without angina pectoris: Secondary | ICD-10-CM | POA: Diagnosis not present

## 2016-03-18 DIAGNOSIS — I10 Essential (primary) hypertension: Secondary | ICD-10-CM | POA: Diagnosis not present

## 2016-03-18 DIAGNOSIS — E785 Hyperlipidemia, unspecified: Secondary | ICD-10-CM | POA: Diagnosis not present

## 2016-03-18 DIAGNOSIS — K219 Gastro-esophageal reflux disease without esophagitis: Secondary | ICD-10-CM | POA: Diagnosis not present

## 2016-03-18 DIAGNOSIS — F419 Anxiety disorder, unspecified: Secondary | ICD-10-CM | POA: Diagnosis not present

## 2016-03-18 DIAGNOSIS — E669 Obesity, unspecified: Secondary | ICD-10-CM | POA: Diagnosis not present

## 2016-03-18 DIAGNOSIS — E118 Type 2 diabetes mellitus with unspecified complications: Secondary | ICD-10-CM | POA: Diagnosis not present

## 2016-03-18 DIAGNOSIS — N400 Enlarged prostate without lower urinary tract symptoms: Secondary | ICD-10-CM | POA: Diagnosis not present

## 2016-03-24 DIAGNOSIS — C61 Malignant neoplasm of prostate: Secondary | ICD-10-CM | POA: Diagnosis not present

## 2016-03-24 DIAGNOSIS — R339 Retention of urine, unspecified: Secondary | ICD-10-CM | POA: Diagnosis not present

## 2016-04-02 DIAGNOSIS — K219 Gastro-esophageal reflux disease without esophagitis: Secondary | ICD-10-CM | POA: Diagnosis not present

## 2016-04-02 DIAGNOSIS — N400 Enlarged prostate without lower urinary tract symptoms: Secondary | ICD-10-CM | POA: Diagnosis not present

## 2016-04-02 DIAGNOSIS — E669 Obesity, unspecified: Secondary | ICD-10-CM | POA: Diagnosis not present

## 2016-04-02 DIAGNOSIS — E1149 Type 2 diabetes mellitus with other diabetic neurological complication: Secondary | ICD-10-CM | POA: Diagnosis not present

## 2016-04-02 DIAGNOSIS — I2510 Atherosclerotic heart disease of native coronary artery without angina pectoris: Secondary | ICD-10-CM | POA: Diagnosis not present

## 2016-04-02 DIAGNOSIS — E785 Hyperlipidemia, unspecified: Secondary | ICD-10-CM | POA: Diagnosis not present

## 2016-04-02 DIAGNOSIS — F419 Anxiety disorder, unspecified: Secondary | ICD-10-CM | POA: Diagnosis not present

## 2016-04-02 DIAGNOSIS — E118 Type 2 diabetes mellitus with unspecified complications: Secondary | ICD-10-CM | POA: Diagnosis not present

## 2016-04-02 DIAGNOSIS — K5900 Constipation, unspecified: Secondary | ICD-10-CM | POA: Diagnosis not present

## 2016-04-02 DIAGNOSIS — I10 Essential (primary) hypertension: Secondary | ICD-10-CM | POA: Diagnosis not present

## 2016-04-11 DIAGNOSIS — I2510 Atherosclerotic heart disease of native coronary artery without angina pectoris: Secondary | ICD-10-CM | POA: Diagnosis not present

## 2016-04-11 DIAGNOSIS — R0600 Dyspnea, unspecified: Secondary | ICD-10-CM | POA: Diagnosis not present

## 2016-04-11 DIAGNOSIS — I10 Essential (primary) hypertension: Secondary | ICD-10-CM | POA: Diagnosis not present

## 2016-04-14 DIAGNOSIS — I25119 Atherosclerotic heart disease of native coronary artery with unspecified angina pectoris: Secondary | ICD-10-CM | POA: Diagnosis not present

## 2016-04-14 DIAGNOSIS — R0602 Shortness of breath: Secondary | ICD-10-CM | POA: Diagnosis not present

## 2016-04-14 DIAGNOSIS — Z951 Presence of aortocoronary bypass graft: Secondary | ICD-10-CM | POA: Diagnosis not present

## 2016-04-14 DIAGNOSIS — I4510 Unspecified right bundle-branch block: Secondary | ICD-10-CM | POA: Diagnosis not present

## 2016-04-18 DIAGNOSIS — E119 Type 2 diabetes mellitus without complications: Secondary | ICD-10-CM | POA: Diagnosis not present

## 2016-04-18 DIAGNOSIS — I10 Essential (primary) hypertension: Secondary | ICD-10-CM | POA: Diagnosis not present

## 2016-04-18 DIAGNOSIS — Z951 Presence of aortocoronary bypass graft: Secondary | ICD-10-CM | POA: Diagnosis not present

## 2016-04-18 DIAGNOSIS — I2510 Atherosclerotic heart disease of native coronary artery without angina pectoris: Secondary | ICD-10-CM | POA: Diagnosis not present

## 2016-04-22 DIAGNOSIS — R339 Retention of urine, unspecified: Secondary | ICD-10-CM | POA: Diagnosis not present

## 2016-04-22 DIAGNOSIS — N3941 Urge incontinence: Secondary | ICD-10-CM | POA: Diagnosis not present

## 2016-04-24 DIAGNOSIS — F419 Anxiety disorder, unspecified: Secondary | ICD-10-CM | POA: Diagnosis not present

## 2016-04-24 DIAGNOSIS — K5900 Constipation, unspecified: Secondary | ICD-10-CM | POA: Diagnosis not present

## 2016-04-24 DIAGNOSIS — I2510 Atherosclerotic heart disease of native coronary artery without angina pectoris: Secondary | ICD-10-CM | POA: Diagnosis not present

## 2016-04-24 DIAGNOSIS — E785 Hyperlipidemia, unspecified: Secondary | ICD-10-CM | POA: Diagnosis not present

## 2016-04-24 DIAGNOSIS — Z0000 Encounter for general adult medical examination without abnormal findings: Secondary | ICD-10-CM | POA: Diagnosis not present

## 2016-04-24 DIAGNOSIS — N400 Enlarged prostate without lower urinary tract symptoms: Secondary | ICD-10-CM | POA: Diagnosis not present

## 2016-04-24 DIAGNOSIS — E669 Obesity, unspecified: Secondary | ICD-10-CM | POA: Diagnosis not present

## 2016-04-24 DIAGNOSIS — E118 Type 2 diabetes mellitus with unspecified complications: Secondary | ICD-10-CM | POA: Diagnosis not present

## 2016-04-24 DIAGNOSIS — I10 Essential (primary) hypertension: Secondary | ICD-10-CM | POA: Diagnosis not present

## 2016-04-28 DIAGNOSIS — C61 Malignant neoplasm of prostate: Secondary | ICD-10-CM | POA: Diagnosis not present

## 2016-04-28 DIAGNOSIS — R339 Retention of urine, unspecified: Secondary | ICD-10-CM | POA: Diagnosis not present

## 2016-04-30 DIAGNOSIS — R0989 Other specified symptoms and signs involving the circulatory and respiratory systems: Secondary | ICD-10-CM | POA: Diagnosis not present

## 2016-04-30 DIAGNOSIS — I739 Peripheral vascular disease, unspecified: Secondary | ICD-10-CM | POA: Diagnosis not present

## 2016-05-09 DIAGNOSIS — R0602 Shortness of breath: Secondary | ICD-10-CM | POA: Diagnosis not present

## 2016-05-09 DIAGNOSIS — I25119 Atherosclerotic heart disease of native coronary artery with unspecified angina pectoris: Secondary | ICD-10-CM | POA: Diagnosis not present

## 2016-05-09 DIAGNOSIS — I739 Peripheral vascular disease, unspecified: Secondary | ICD-10-CM | POA: Diagnosis not present

## 2016-05-09 DIAGNOSIS — Z951 Presence of aortocoronary bypass graft: Secondary | ICD-10-CM | POA: Diagnosis not present

## 2016-05-19 DIAGNOSIS — C61 Malignant neoplasm of prostate: Secondary | ICD-10-CM | POA: Diagnosis not present

## 2016-05-26 DIAGNOSIS — N3941 Urge incontinence: Secondary | ICD-10-CM | POA: Diagnosis not present

## 2016-05-26 DIAGNOSIS — R339 Retention of urine, unspecified: Secondary | ICD-10-CM | POA: Diagnosis not present

## 2016-05-26 DIAGNOSIS — C61 Malignant neoplasm of prostate: Secondary | ICD-10-CM | POA: Diagnosis not present

## 2016-06-11 DIAGNOSIS — R1084 Generalized abdominal pain: Secondary | ICD-10-CM | POA: Diagnosis not present

## 2016-06-18 DIAGNOSIS — I25119 Atherosclerotic heart disease of native coronary artery with unspecified angina pectoris: Secondary | ICD-10-CM | POA: Diagnosis not present

## 2016-06-18 DIAGNOSIS — I739 Peripheral vascular disease, unspecified: Secondary | ICD-10-CM | POA: Diagnosis not present

## 2016-06-18 DIAGNOSIS — R0602 Shortness of breath: Secondary | ICD-10-CM | POA: Diagnosis not present

## 2016-06-18 DIAGNOSIS — Z951 Presence of aortocoronary bypass graft: Secondary | ICD-10-CM | POA: Diagnosis not present

## 2016-07-11 DIAGNOSIS — N400 Enlarged prostate without lower urinary tract symptoms: Secondary | ICD-10-CM | POA: Diagnosis not present

## 2016-07-11 DIAGNOSIS — N3281 Overactive bladder: Secondary | ICD-10-CM | POA: Diagnosis not present

## 2016-07-11 DIAGNOSIS — C61 Malignant neoplasm of prostate: Secondary | ICD-10-CM | POA: Diagnosis not present

## 2016-07-17 DIAGNOSIS — F419 Anxiety disorder, unspecified: Secondary | ICD-10-CM | POA: Diagnosis not present

## 2016-07-17 DIAGNOSIS — M545 Low back pain: Secondary | ICD-10-CM | POA: Diagnosis not present

## 2016-07-17 DIAGNOSIS — R1013 Epigastric pain: Secondary | ICD-10-CM | POA: Diagnosis not present

## 2016-07-17 DIAGNOSIS — I10 Essential (primary) hypertension: Secondary | ICD-10-CM | POA: Diagnosis not present

## 2016-07-17 DIAGNOSIS — N400 Enlarged prostate without lower urinary tract symptoms: Secondary | ICD-10-CM | POA: Diagnosis not present

## 2016-07-17 DIAGNOSIS — E669 Obesity, unspecified: Secondary | ICD-10-CM | POA: Diagnosis not present

## 2016-07-17 DIAGNOSIS — I2510 Atherosclerotic heart disease of native coronary artery without angina pectoris: Secondary | ICD-10-CM | POA: Diagnosis not present

## 2016-07-17 DIAGNOSIS — E118 Type 2 diabetes mellitus with unspecified complications: Secondary | ICD-10-CM | POA: Diagnosis not present

## 2016-07-17 DIAGNOSIS — E785 Hyperlipidemia, unspecified: Secondary | ICD-10-CM | POA: Diagnosis not present

## 2016-08-05 DIAGNOSIS — E118 Type 2 diabetes mellitus with unspecified complications: Secondary | ICD-10-CM | POA: Diagnosis not present

## 2016-08-05 DIAGNOSIS — H538 Other visual disturbances: Secondary | ICD-10-CM | POA: Diagnosis not present

## 2016-08-05 DIAGNOSIS — M545 Low back pain: Secondary | ICD-10-CM | POA: Diagnosis not present

## 2016-08-05 DIAGNOSIS — Z01118 Encounter for examination of ears and hearing with other abnormal findings: Secondary | ICD-10-CM | POA: Diagnosis not present

## 2016-08-05 DIAGNOSIS — Z0000 Encounter for general adult medical examination without abnormal findings: Secondary | ICD-10-CM | POA: Diagnosis not present

## 2016-08-05 DIAGNOSIS — R1013 Epigastric pain: Secondary | ICD-10-CM | POA: Diagnosis not present

## 2016-08-05 DIAGNOSIS — I10 Essential (primary) hypertension: Secondary | ICD-10-CM | POA: Diagnosis not present

## 2016-08-05 DIAGNOSIS — I2510 Atherosclerotic heart disease of native coronary artery without angina pectoris: Secondary | ICD-10-CM | POA: Diagnosis not present

## 2016-08-05 DIAGNOSIS — E785 Hyperlipidemia, unspecified: Secondary | ICD-10-CM | POA: Diagnosis not present

## 2016-08-06 DIAGNOSIS — D509 Iron deficiency anemia, unspecified: Secondary | ICD-10-CM | POA: Diagnosis not present

## 2016-08-06 DIAGNOSIS — E669 Obesity, unspecified: Secondary | ICD-10-CM | POA: Diagnosis not present

## 2016-08-06 DIAGNOSIS — E1165 Type 2 diabetes mellitus with hyperglycemia: Secondary | ICD-10-CM | POA: Diagnosis not present

## 2016-08-06 DIAGNOSIS — R1084 Generalized abdominal pain: Secondary | ICD-10-CM | POA: Diagnosis not present

## 2016-08-13 DIAGNOSIS — M545 Low back pain: Secondary | ICD-10-CM | POA: Diagnosis not present

## 2016-08-19 DIAGNOSIS — C61 Malignant neoplasm of prostate: Secondary | ICD-10-CM | POA: Diagnosis not present

## 2016-08-20 DIAGNOSIS — M545 Low back pain: Secondary | ICD-10-CM | POA: Diagnosis not present

## 2016-08-26 DIAGNOSIS — N400 Enlarged prostate without lower urinary tract symptoms: Secondary | ICD-10-CM | POA: Diagnosis not present

## 2016-08-26 DIAGNOSIS — C61 Malignant neoplasm of prostate: Secondary | ICD-10-CM | POA: Diagnosis not present

## 2016-08-28 DIAGNOSIS — M545 Low back pain: Secondary | ICD-10-CM | POA: Diagnosis not present

## 2016-09-04 DIAGNOSIS — I2510 Atherosclerotic heart disease of native coronary artery without angina pectoris: Secondary | ICD-10-CM | POA: Diagnosis not present

## 2016-09-04 DIAGNOSIS — M545 Low back pain: Secondary | ICD-10-CM | POA: Diagnosis not present

## 2016-09-04 DIAGNOSIS — I10 Essential (primary) hypertension: Secondary | ICD-10-CM | POA: Diagnosis not present

## 2016-09-04 DIAGNOSIS — R1013 Epigastric pain: Secondary | ICD-10-CM | POA: Diagnosis not present

## 2016-09-04 DIAGNOSIS — E785 Hyperlipidemia, unspecified: Secondary | ICD-10-CM | POA: Diagnosis not present

## 2016-09-04 DIAGNOSIS — E118 Type 2 diabetes mellitus with unspecified complications: Secondary | ICD-10-CM | POA: Diagnosis not present

## 2016-09-04 DIAGNOSIS — D509 Iron deficiency anemia, unspecified: Secondary | ICD-10-CM | POA: Diagnosis not present

## 2016-09-04 DIAGNOSIS — N400 Enlarged prostate without lower urinary tract symptoms: Secondary | ICD-10-CM | POA: Diagnosis not present

## 2016-09-04 DIAGNOSIS — N289 Disorder of kidney and ureter, unspecified: Secondary | ICD-10-CM | POA: Diagnosis not present

## 2016-09-09 DIAGNOSIS — R1084 Generalized abdominal pain: Secondary | ICD-10-CM | POA: Diagnosis not present

## 2016-09-09 DIAGNOSIS — D509 Iron deficiency anemia, unspecified: Secondary | ICD-10-CM | POA: Diagnosis not present

## 2016-09-16 DIAGNOSIS — D509 Iron deficiency anemia, unspecified: Secondary | ICD-10-CM | POA: Diagnosis not present

## 2016-09-16 DIAGNOSIS — N400 Enlarged prostate without lower urinary tract symptoms: Secondary | ICD-10-CM | POA: Diagnosis not present

## 2016-09-16 DIAGNOSIS — I2510 Atherosclerotic heart disease of native coronary artery without angina pectoris: Secondary | ICD-10-CM | POA: Diagnosis not present

## 2016-09-16 DIAGNOSIS — E118 Type 2 diabetes mellitus with unspecified complications: Secondary | ICD-10-CM | POA: Diagnosis not present

## 2016-09-16 DIAGNOSIS — M545 Low back pain: Secondary | ICD-10-CM | POA: Diagnosis not present

## 2016-09-16 DIAGNOSIS — R1013 Epigastric pain: Secondary | ICD-10-CM | POA: Diagnosis not present

## 2016-09-16 DIAGNOSIS — I10 Essential (primary) hypertension: Secondary | ICD-10-CM | POA: Diagnosis not present

## 2016-09-16 DIAGNOSIS — N289 Disorder of kidney and ureter, unspecified: Secondary | ICD-10-CM | POA: Diagnosis not present

## 2016-09-16 DIAGNOSIS — E785 Hyperlipidemia, unspecified: Secondary | ICD-10-CM | POA: Diagnosis not present

## 2016-09-17 DIAGNOSIS — D631 Anemia in chronic kidney disease: Secondary | ICD-10-CM | POA: Diagnosis not present

## 2016-09-17 DIAGNOSIS — I129 Hypertensive chronic kidney disease with stage 1 through stage 4 chronic kidney disease, or unspecified chronic kidney disease: Secondary | ICD-10-CM | POA: Diagnosis not present

## 2016-09-17 DIAGNOSIS — N2581 Secondary hyperparathyroidism of renal origin: Secondary | ICD-10-CM | POA: Diagnosis not present

## 2016-09-17 DIAGNOSIS — N183 Chronic kidney disease, stage 3 (moderate): Secondary | ICD-10-CM | POA: Diagnosis not present

## 2016-09-29 HISTORY — PX: CARDIAC CATHETERIZATION: SHX172

## 2016-10-08 DIAGNOSIS — R1084 Generalized abdominal pain: Secondary | ICD-10-CM | POA: Diagnosis not present

## 2016-10-08 DIAGNOSIS — D509 Iron deficiency anemia, unspecified: Secondary | ICD-10-CM | POA: Diagnosis not present

## 2016-10-13 DIAGNOSIS — R0602 Shortness of breath: Secondary | ICD-10-CM | POA: Diagnosis not present

## 2016-10-13 DIAGNOSIS — I25119 Atherosclerotic heart disease of native coronary artery with unspecified angina pectoris: Secondary | ICD-10-CM | POA: Diagnosis not present

## 2016-10-13 DIAGNOSIS — I739 Peripheral vascular disease, unspecified: Secondary | ICD-10-CM | POA: Diagnosis not present

## 2016-10-13 DIAGNOSIS — I5033 Acute on chronic diastolic (congestive) heart failure: Secondary | ICD-10-CM | POA: Diagnosis not present

## 2016-10-27 DIAGNOSIS — I25119 Atherosclerotic heart disease of native coronary artery with unspecified angina pectoris: Secondary | ICD-10-CM | POA: Diagnosis not present

## 2016-10-27 DIAGNOSIS — R0602 Shortness of breath: Secondary | ICD-10-CM | POA: Diagnosis not present

## 2016-10-27 DIAGNOSIS — I739 Peripheral vascular disease, unspecified: Secondary | ICD-10-CM | POA: Diagnosis not present

## 2016-10-27 DIAGNOSIS — R9439 Abnormal result of other cardiovascular function study: Secondary | ICD-10-CM | POA: Diagnosis not present

## 2016-10-29 DIAGNOSIS — I25119 Atherosclerotic heart disease of native coronary artery with unspecified angina pectoris: Secondary | ICD-10-CM | POA: Diagnosis not present

## 2016-11-03 DIAGNOSIS — I209 Angina pectoris, unspecified: Secondary | ICD-10-CM | POA: Diagnosis present

## 2016-11-03 NOTE — H&P (Signed)
OFFICE VISIT NOTES COPIED TO EPIC FOR DOCUMENTATION  . History of Present Illness Justin Maine FNP-C; 10/27/2016 1:35 PM) Patient words: 2 week f/u for shortness of breath; last office visit 10/13/2016; Pt states he is still having some SOB, paind in legs,feet, and toes. Pt states the pain in his legs have been for a while since june. Pt states walking around hurts alot and gets very sob.  The patient is a 83 year old male who presents for a follow-up for Coronary artery disease.  Additional reasons for visit:  Follow-up for Shortness of breath is described as the following: He has a history of CAD, S/P CABG in 2009, PAD, type II diabetes, and hyperlipidemia. Denies PND, orthopnea, palpitations, syncopal episodes, or symptoms of TIA. He does have occasional episodes of chest pain that is relieved with Nitroglycerin. This is stable.  He had been doing well until two weeks ago he complained of worsening dyspnea, and reports not eating healthy over the holidays with also some weight gain. He had also noticed mild ankle edema. No chest pain. No dizziness or syncope. He reports that he usually sleeps well at night. He does occasionally nap during the day.  Also complained of worsening symptoms of leg pain with minimal activity. Symptoms were initally better with Pletal; however, symptoms are recurrent and states that he is unable to even walk to the mailbox without having to stop with severe cramping in his calves right slightly worse than the left. Denies any ulceration or bluish discoloration. No rest pain. BNP was ordered and he was told to increase Lasix for 4 days.  He now presents for 2 week follow up of shortness of breath and PAD. Patient reports continued SOB on excertion and claudication symptoms that may have even slightly worsened despite increasing Lasix. He denies any other new symptoms.   Problem List/Past Medical Bernell List; 10/27/2016 8:39 AM) Shortness of breath on  exertion (R06.02)  Echocardiogram (PCP) 04/11/2016: Normal LV systolic function, normal LV size, LVEF 54%. Grade 1 diastolic dysfunction. Mild pulmonic and mitral regurgitation, PA pressure estimated at 30 mmHg. IVC normal. Claudication (I73.9)  LE arterial duplex 04/30/2016: No hemodynamically significant stenoses are identified in the lower extremity arterial system. There is mild diffuse mixed plaque in the vessels above knee and diffuse disease with monophasic dampened waveforms throughout the small vessels below the knee. Non-compressible ABI, suggestive of medial calcinosis. Hypercholesteremia (E78.00)  Uncontrolled type 2 diabetes mellitus with complication, without long-term current use of insulin (E11.8, E11.65)  RBBB (I45.10)  Atherosclerosis of native coronary artery of native heart with angina pectoris (I25.119)  Lexiscan myoview stress test 04/17/2016: 1. Resting EKG demonstrates normal sinus rhythm, left axis deviation, right bundle branch block. Stress EKG is nondiagnostic for ischemia as a pharmacologic stress test. There are frequent PACs during the stress test. Stress symptoms included dyspnea. 2. The Perfusion images reveal the left ventricle to be mildly dilated at 132 mL both in rest and stress images. There is a moderate-sized inferior and inferoapical scar with very mild peri-infarct ischemia noted especially towards the apex. Left ventricle systolic function was moderate to severely depressed at 36%. 3. This is an intermediate risk study, clinical correlation recommended. Right carotid bruit (R09.89)  Carotid artery duplex 04/30/2016: Stenosis in the right internal carotid artery (50-69%) with diffuse heteregenous plaque. Right carotid bulb with <50% stenosis with mixed plaque. Moderate stenosis in the right external carotid artery (>50%). Antegrade vertebral artery flow. Follow up in six months is appropriate  if clinically indicated. Labwork  10/13/2016: BNP 46.8 Labs  09/17/2016: BUN 32, serum creatinine 1.35, eGFR 37 mL, potassium 4.7. Hemoglobin 13.5/hematocrit 40.1, platelets 207. 04/02/2016: BNP 102.5, glucose 153, BUN 36, creatinine 1.31, eGFR 59, potassium 4.9, homocystine normal, TSH 1.24, HB 12.3/HCT 39.4 with slightly microcytic indices, serum iron slightly reduced at 33 with iron saturation reduced at 11%, iron panel otherwise normal, vitamin B12/folic acid normal, MWN0U 6.8%, vitamin D 44.5, total cholesterol 138, triglycerides 58, HDL 55, LDL 71 Acute on chronic diastolic CHF (congestive heart failure) (I50.33)  Diverticulitis (K57.92)  Prostate cancer (C61)  GERD (gastroesophageal reflux disease) (K21.9)  Brown bowel syndrome (K63.9)  Arthritis (M19.90)  S/P CABG (coronary artery bypass graft) (Z95.1) 10/19/2006 Coronary angiogram 06/23/2007: LVEF 65-70%. Severe triple-vessel disease, LAD 90%, ramus intermediate occluded, OM 2 occluded. Diffuse RCA disease, PDA 70% mid stenosis. Skipped SVG to ramus and OM 2 patent, LIMA to LAD patent occluded SVG to PDA.  Allergies Jonelle Sidle Barton Dubois; 11/10/2016 8:39 AM) Ibuprofen *ANALGESICS - ANTI-INFLAMMATORY*  Nausea. Iodinated Contrast   Family History Bernell List; November 10, 2016 8:39 AM) Mother  Deceased. not sure of age and reason of passing Father  Deceased. around age 67, diabetes, no known heart conditions Sister 2  younger, heart conditions Brother 5  3 passed (not sure causes), 2 living, one older (diabetes, heart issues), one younger  Social History Bernell List; 10-Nov-2016 8:39 AM) Current tobacco use  Never smoker. Non Drinker/No Alcohol Use  Marital status  Widowed. Living Situation  Lives alone. Number of Children  4.  Past Surgical History Bernell List; November 10, 2016 8:39 AM) Double Hernia 1960 Small intestine surgery 2007 Coronary Artery Bypass, Three 2009 Prostate Surgery 03/2015  Medication History (Tiffany Barton Dubois; 10-Nov-2016 8:59 AM) Clarnce Flock Palmetto (Serenoa repens)  (160MG Tablet, 1 Oral daily) Discontinued: patient Choice. Selenium (200MCG Capsule, 1 Oral daily) Discontinued: patient Choice. Simvastatin (20MG Tablet, 1 Oral daily) Active. Amitiza (24MCG Capsule, 1 Oral as needed) Active. ClonazePAM (0.5MG Tablet, 1-2 Oral as needed) Active. Januvia (100MG Tablet, 1 Oral daily) Active. Chlorthalidone (25MG Tablet, 1 Oral daily) Active. Metoprolol Tartrate (50MG Tablet, 1 Oral two times daily) Active. Ramipril (10MG Capsule, 1 Oral daily) Active. Flomax (0.4MG Capsule, 1 Oral daily) Active. Isosorbide Mononitrate ER (60MG Tablet ER 24HR, 1 (one) Tablet Tablet Oral daily, Taken starting 06/18/2016) Active. Cilostazol (100MG Tablet, 1 (one) Tablet Tablet Oral two times daily, Taken starting 06/18/2016) Active. Gabapentin (300MG Capsule, 1 Oral daily) Active. Omeprazole (20MG Capsule DR, 1 Oral daily) Active. Aspirin (81MG Tablet, 1 Oral daily) Active. Vitamin D (Cholecalciferol) (1000UNIT Capsule, 1 Oral daily) Active. Lycopene (10MG Capsule, 1 Oral daily) Active. Lantus SoloStar (100UNIT/ML Soln Pen-inj, 30 am Subcutaneous 30 pm) Active. Nitroglycerin (0.4MG Tab Sublingual, 1 Tablet Sublingual every 5 minutes as needed for chest pain., Taken starting 10/13/2016) Active. Furosemide (40MG Tablet, 1 (one) Tablet Oral as needed, Taken starting 10/13/2016) Active. (take with potassium. Take daily for 4 days then as needed.) Potassium Chloride ER (10MEQ Capsule ER, 1 (one) Capsule Oral daily as needed, Taken starting 10/13/2016) Active. (take 10 mEq daily when taking Lasix) Probiotic (1 Oral daily) Active. IBGARD (1 daily) Active. Medications Reconciled (List present & verbally w/ pt)  Diagnostic Studies History Bernell List; 11-10-16 8:39 AM) Treadmill stress test 2009 Normal. Colonoscopy 2015 1 polyp removed Echocardiogram 04/11/2016 Echocardiogram (PCP: Normal LV systolic function, normal LV size, LVEF 54%. Grade 1 diastolic  dysfunction. Mild pulmonic and mitral regurgitation, PA pressure estimated at 30 mmHg. IVC normal. Nuclear stress test 04/17/2016 1. Resting  EKG demonstrates normal sinus rhythm, left axis deviation, right bundle branch block. Stress EKG is nondiagnostic for ischemia as a pharmacologic stress test. There are frequent PACs during the stress test. Stress symptoms included dyspnea. 2. The Perfusion images reveal the left ventricle to be mildly dilated at 132 mL both in rest and stress images. There is a moderate-sized inferior and inferoapical scar with very mild peri-infarct ischemia noted especially towards the apex. Left ventricle systolic function was moderate to severely depressed at 36%. 3. This is an intermediate risk study, clinical correlation recommended. Carotid Doppler 04/30/2016 Stenosis in the right internal carotid artery (50-69%) with diffuse heteregenous plaque. Right carotid bulb with <50% stenosis with mixed plaque. Moderate stenosis in the right external carotid artery (>50%). Antegrade vertebral artery flow. Follow up in six months is appropriate if clinically indicated. Lower Extremity Dopplers 04/30/2016 No hemodynamically significant stenoses are identified in the lower extremity arterial system. There is mild diffuse mixed plaque in the vessels above knee and diffuse disease with monophasic dampened waveforms throughout the small vesses below the knee. Non-compressible ABI, suggestive of medial calcinosis.    Review of Systems Justin Maine, FNP-C; 10/27/2016 1:39 PM) General Not Present- Anorexia, Fatigue and Fever. Respiratory Present- Decreased Exercise Tolerance and Difficulty Breathing on Exertion. Not Present- Cough. Cardiovascular Present- Claudications, Edema and Shortness of Breath. Not Present- Chest Pain, Orthopnea, Palpitations and Paroxysmal Nocturnal Dyspnea. Gastrointestinal Not Present- Black, Tarry Stool, Change in Bowel Habits and Nausea. Musculoskeletal  Present- Back Pain (chronic), Claudication, Leg Weakness and Muscle Cramps. Neurological Not Present- Focal Neurological Symptoms and Syncope. Endocrine Not Present- Cold Intolerance, Excessive Sweating, Heat Intolerance and Thyroid Problems. Hematology Not Present- Anemia, Easy Bruising, Petechiae and Prolonged Bleeding.  Vitals Bernell List; 10/27/2016 9:01 AM) 10/27/2016 8:44 AM Weight: 196.5 lb Height: 67in Body Surface Area: 2.01 m Body Mass Index: 30.78 kg/m  Pulse: 68 (Regular)  P.OX: 95% (Room air) BP: 130/58 (Sitting, Left Arm, Standard)       Physical Exam Justin Maine, FNP-C; 10/27/2016 1:39 PM) General Mental Status-Alert. General Appearance-Cooperative and Appears stated age. Build & Nutrition-Moderately built.  Head and Neck Thyroid Gland Characteristics - normal size and consistency and no palpable nodules.  Chest and Lung Exam Chest and lung exam reveals -quiet, even and easy respiratory effort with no use of accessory muscles, non-tender and on auscultation, normal breath sounds, no adventitious sounds.  Cardiovascular Cardiovascular examination reveals -normal heart sounds, regular rate and rhythm with no murmurs(distant heart sounds).  Abdomen Palpation/Percussion Normal exam - Non Tender and No hepatosplenomegaly.  Peripheral Vascular Lower Extremity Inspection - Bilateral - Pale and Rapid capillary refill, No Ulcerations, No Varicose ulcers. Palpation - Edema - Bilateral - No edema. Femoral pulse - Right - Normal. Bilateral - Normal. Popliteal pulse - Left - Feeble. Right - Absent. Dorsalis pedis pulse - Bilateral - Absent. Posterior tibial pulse - Bilateral - Absent. Carotid arteries - Left-No Carotid bruit. Carotid arteries - Right-Soft Bruit.  Neurologic Neurologic evaluation reveals -alert and oriented x 3 with no impairment of recent or remote memory. Motor-Grossly intact without any focal  deficits.  Musculoskeletal Global Assessment Left Lower Extremity - no deformities, masses or tenderness, no known fractures. Right Lower Extremity - no deformities, masses or tenderness, no known fractures.    Assessment & Plan Justin Maine FNP-C; 10/27/2016 1:38 PM) Chronic diastolic heart failure (M35.59) Shortness of breath on exertion (R06.02) Story: Echocardiogram (PCP) 04/11/2016: Normal LV systolic function, normal LV size, LVEF 54%. Grade 1  diastolic dysfunction. Mild pulmonic and mitral regurgitation, PA pressure estimated at 30 mmHg. IVC normal. Atherosclerosis of native coronary artery of native heart with angina pectoris (I25.119) Story: Lexiscan myoview stress test 04/17/2016: 1. Resting EKG demonstrates normal sinus rhythm, left axis deviation, right bundle branch block. Stress EKG is nondiagnostic for ischemia as a pharmacologic stress test. There are frequent PACs during the stress test. Stress symptoms included dyspnea. 2. The Perfusion images reveal the left ventricle to be mildly dilated at 132 mL both in rest and stress images. There is a moderate-sized inferior and inferoapical scar with very mild peri-infarct ischemia noted especially towards the apex. Left ventricle systolic function was moderate to severely depressed at 36%. 3. This is an intermediate risk study, clinical correlation recommended. Impression: EKG 150 2018: Normal sinus rhythm at rate of 68 sweet woman, left axis deviation, left can't fascicular block, cannot exclude inferior infarct old. Right bundle branch block. Nonspecific T abnormality. PAC. No significant change from EKG 04/14/2016. Current Plans METABOLIC PANEL, BASIC (56314) CBC & PLATELETS (AUTO) (97026) PT (PROTHROMBIN TIME) (37858) Claudication (I73.9) Story: LE arterial duplex 04/30/2016: No hemodynamically significant stenoses are identified in the lower extremity arterial system. There is mild diffuse mixed plaque in the vessels above  knee and diffuse disease with monophasic dampened waveforms throughout the small vessels below the knee. Non-compressible ABI, suggestive of medial calcinosis. Labwork  Labs 10/29/2016: Serum glucose 141 mg, BUN 29, creatinine 1.48, eGFR 44 mL.  Potassium 4.8.  CBC normal, very minimal microcytosis present with elevated RDW at 18.1.  HB 13.4/HCT 39.9.  Pro time normal. Story: 10/13/2016: BNP 46.8  Labs 09/17/2016: BUN 32, serum creatinine 1.35, eGFR 37 mL, potassium 4.7. Hemoglobin 13.5/hematocrit 40.1, platelets 207.  04/02/2016: BNP 102.5, glucose 153, BUN 36, creatinine 1.31, eGFR 59, potassium 4.9, homocystine normal, TSH 1.24, HB 12.3/HCT 39.4 with slightly microcytic indices, serum iron slightly reduced at 33 with iron saturation reduced at 11%, iron panel otherwise normal, vitamin B12/folic acid normal, IFO2D 6.8%, vitamin D 44.5, total cholesterol 138, triglycerides 58, HDL 55, LDL 71 Current Plans Mechanism of underlying disease process and action of medications discussed with the patient. I discussed primary/secondary prevention and also dietary counseling was done. Patient presents for 2 week follow up for DOE and claudication. Patient is reporting that symptoms have not improved and may have even slightly worsened. BNP was normal, which leads me to think this may be unrelated to heart failure and currently unsure of etiology of shortness of breath. He did have an abnormal stress test in 2017 and reports that shortness of breath was his symptom when he needed a CABG. He is on maximal optimal therapy and symptoms have continued to worsen. I feel that he will need further evaluation with heart cath. Last labs reviewed and shows decreased kidney function. Discussed possibility of further kidney damage with him due to contrast dye. Schedule for cardiac catheterization, and possible angioplasty. We discussed regarding risks, benefits, alternatives to this including stress testing, CTA and continued  medical therapy. Patient wants to proceed. Understands <1-2% risk of death, stroke, MI, urgent CABG, bleeding, infection, renal failure but not limited to these.  PAD was previously improving with medical therapy; however, he reports claudication which is severe and lifestylle restricting and unable to walk to mail box. Vascular exam is unchanged from previous exam essentially. He will likely need arteriogram for further evaluation at a later date; however, i feel that heart catheterization takes precedence to further exam etiology of DOE as  it may be related to coronary blockage. PAD could be later evaluated. Discussed if no coronary blockages found, we may can look at this during the procedure. Patient verbalized understanding and agreeance to plan.  Discontinued Lasix today, as he does not report improved shortness of breath with diuresis. Advised to hold chlorithalidone day before and day of heart cath. We will follow up after procedure.  CC Dr. Iona Beard Osei-Bonsu.  Abnormal nuclear stress test (R94.39)  Addendum Note(Jagadeesh Carlynn Herald MD; 10/29/2016 10:00 PM) Labs 10/29/2016: Serum glucose 141 mg, BUN 29, creatinine 1.48, eGFR 44 mL. Potassium 4.8. CBC normal, very minimal microcytosis present with elevated RDW at 18.1. HB 13.4/HCT 39.9. Pro time normal.  10/13/2016: BNP 46.8; Labs 09/17/2016: BUN 32, serum creatinine 1.35, eGFR 37 mL, potassium 4.7. Hemoglobin 13.5/hematocrit 40.1, platelets 207.   Signed electronically by Justin Maine, FNP-C (10/27/2016 1:39 PM)

## 2016-11-04 ENCOUNTER — Ambulatory Visit (HOSPITAL_COMMUNITY)
Admission: RE | Admit: 2016-11-04 | Discharge: 2016-11-04 | Disposition: A | Discharge status: Home / Self Care | Payer: Medicare PPO | Source: Ambulatory Visit | Attending: Cardiology | Admitting: Cardiology

## 2016-11-04 ENCOUNTER — Encounter (HOSPITAL_COMMUNITY)
Admission: RE | Disposition: A | Discharge status: Home / Self Care | Payer: Self-pay | Source: Ambulatory Visit | Attending: Cardiology

## 2016-11-04 DIAGNOSIS — E785 Hyperlipidemia, unspecified: Secondary | ICD-10-CM | POA: Diagnosis not present

## 2016-11-04 DIAGNOSIS — Z833 Family history of diabetes mellitus: Secondary | ICD-10-CM | POA: Diagnosis not present

## 2016-11-04 DIAGNOSIS — K219 Gastro-esophageal reflux disease without esophagitis: Secondary | ICD-10-CM | POA: Insufficient documentation

## 2016-11-04 DIAGNOSIS — E1165 Type 2 diabetes mellitus with hyperglycemia: Secondary | ICD-10-CM | POA: Insufficient documentation

## 2016-11-04 DIAGNOSIS — Z7982 Long term (current) use of aspirin: Secondary | ICD-10-CM | POA: Diagnosis not present

## 2016-11-04 DIAGNOSIS — I2584 Coronary atherosclerosis due to calcified coronary lesion: Secondary | ICD-10-CM | POA: Diagnosis not present

## 2016-11-04 DIAGNOSIS — Z8546 Personal history of malignant neoplasm of prostate: Secondary | ICD-10-CM | POA: Insufficient documentation

## 2016-11-04 DIAGNOSIS — I70213 Atherosclerosis of native arteries of extremities with intermittent claudication, bilateral legs: Secondary | ICD-10-CM | POA: Insufficient documentation

## 2016-11-04 DIAGNOSIS — E1151 Type 2 diabetes mellitus with diabetic peripheral angiopathy without gangrene: Secondary | ICD-10-CM | POA: Diagnosis not present

## 2016-11-04 DIAGNOSIS — I4510 Unspecified right bundle-branch block: Secondary | ICD-10-CM | POA: Insufficient documentation

## 2016-11-04 DIAGNOSIS — I5032 Chronic diastolic (congestive) heart failure: Secondary | ICD-10-CM | POA: Insufficient documentation

## 2016-11-04 DIAGNOSIS — I25118 Atherosclerotic heart disease of native coronary artery with other forms of angina pectoris: Secondary | ICD-10-CM | POA: Diagnosis not present

## 2016-11-04 DIAGNOSIS — Z951 Presence of aortocoronary bypass graft: Secondary | ICD-10-CM | POA: Diagnosis not present

## 2016-11-04 DIAGNOSIS — E7800 Pure hypercholesterolemia, unspecified: Secondary | ICD-10-CM | POA: Insufficient documentation

## 2016-11-04 DIAGNOSIS — I2582 Chronic total occlusion of coronary artery: Secondary | ICD-10-CM | POA: Insufficient documentation

## 2016-11-04 DIAGNOSIS — M1990 Unspecified osteoarthritis, unspecified site: Secondary | ICD-10-CM | POA: Insufficient documentation

## 2016-11-04 DIAGNOSIS — R0609 Other forms of dyspnea: Secondary | ICD-10-CM | POA: Diagnosis not present

## 2016-11-04 DIAGNOSIS — I739 Peripheral vascular disease, unspecified: Secondary | ICD-10-CM | POA: Diagnosis not present

## 2016-11-04 DIAGNOSIS — Z794 Long term (current) use of insulin: Secondary | ICD-10-CM | POA: Insufficient documentation

## 2016-11-04 DIAGNOSIS — I209 Angina pectoris, unspecified: Secondary | ICD-10-CM | POA: Diagnosis present

## 2016-11-04 HISTORY — PX: LEFT HEART CATH AND CORONARY ANGIOGRAPHY: CATH118249

## 2016-11-04 HISTORY — PX: LOWER EXTREMITY ANGIOGRAPHY: CATH118251

## 2016-11-04 LAB — GLUCOSE, CAPILLARY
Glucose-Capillary: 186 mg/dL — ABNORMAL HIGH (ref 65–99)
Glucose-Capillary: 175 mg/dL — ABNORMAL HIGH (ref 65–99)

## 2016-11-04 SURGERY — LEFT HEART CATH AND CORONARY ANGIOGRAPHY

## 2016-11-04 MED ORDER — SODIUM CHLORIDE 0.9% FLUSH: 3.0000 mL | INTRAVENOUS | Status: DC | PRN

## 2016-11-04 MED ORDER — SODIUM CHLORIDE 0.9% FLUSH
3.0000 mL | Freq: Two times a day (BID) | INTRAVENOUS | Status: DC
Start: 2016-11-04 — End: 2016-11-04

## 2016-11-04 MED ORDER — RAMIPRIL 10 MG PO CAPS: 10.0000 mg | ORAL_CAPSULE | Freq: Every day | ORAL | Status: DC

## 2016-11-04 MED ORDER — HEPARIN (PORCINE) IN NACL 2-0.9 UNIT/ML-% IJ SOLN
INTRAMUSCULAR | Status: AC
  Filled 2016-11-04: qty 1000

## 2016-11-04 MED ORDER — SODIUM CHLORIDE 0.9 % WEIGHT BASED INFUSION: 1.0000 mL/kg/h | INTRAVENOUS | Status: DC

## 2016-11-04 MED ORDER — ASPIRIN 81 MG PO CHEW
CHEWABLE_TABLET | ORAL | Status: AC
  Administered 2016-11-04: 81 mg via ORAL
  Filled 2016-11-04: qty 1

## 2016-11-04 MED ORDER — LIDOCAINE HCL (PF) 1 % IJ SOLN
INTRAMUSCULAR | Status: AC
  Filled 2016-11-04: qty 30

## 2016-11-04 MED ORDER — METHYLPREDNISOLONE SODIUM SUCC 125 MG IJ SOLR
125.0000 mg | INTRAMUSCULAR | Status: AC
  Administered 2016-11-04: 125 mg via INTRAVENOUS

## 2016-11-04 MED ORDER — METHYLPREDNISOLONE SODIUM SUCC 125 MG IJ SOLR
INTRAMUSCULAR | Status: AC
  Administered 2016-11-04: 125 mg via INTRAVENOUS
  Filled 2016-11-04: qty 2

## 2016-11-04 MED ORDER — ASPIRIN 81 MG PO CHEW
81.0000 mg | CHEWABLE_TABLET | ORAL | Status: AC
  Administered 2016-11-04: 81 mg via ORAL

## 2016-11-04 MED ORDER — HEPARIN (PORCINE) IN NACL 2-0.9 UNIT/ML-% IJ SOLN
INTRAMUSCULAR | Status: DC | PRN
  Administered 2016-11-04: 1000 mL

## 2016-11-04 MED ORDER — IOPAMIDOL (ISOVUE-370) INJECTION 76%
INTRAVENOUS | Status: DC | PRN
  Administered 2016-11-04: 120 mL via INTRA_ARTERIAL

## 2016-11-04 MED ORDER — IOPAMIDOL (ISOVUE-370) INJECTION 76%
INTRAVENOUS | Status: AC
Start: 2016-11-04 — End: 2016-11-04
  Filled 2016-11-04: qty 100

## 2016-11-04 MED ORDER — MIDAZOLAM HCL 2 MG/2ML IJ SOLN
INTRAMUSCULAR | Status: AC
  Filled 2016-11-04: qty 2

## 2016-11-04 MED ORDER — HYDRALAZINE HCL 20 MG/ML IJ SOLN
INTRAMUSCULAR | Status: AC
  Filled 2016-11-04: qty 1

## 2016-11-04 MED ORDER — SODIUM CHLORIDE 0.9% FLUSH: 3.0000 mL | Freq: Two times a day (BID) | INTRAVENOUS | Status: DC

## 2016-11-04 MED ORDER — SODIUM CHLORIDE 0.9 % IV SOLN: 250.0000 mL | INTRAVENOUS | Status: DC | PRN

## 2016-11-04 MED ORDER — HYDRALAZINE HCL 20 MG/ML IJ SOLN
10.0000 mg | Freq: Once | INTRAMUSCULAR | Status: AC
  Administered 2016-11-04: 10 mg via INTRAVENOUS

## 2016-11-04 MED ORDER — MIDAZOLAM HCL 2 MG/2ML IJ SOLN
INTRAMUSCULAR | Status: DC | PRN
  Administered 2016-11-04: 2 mg via INTRAVENOUS

## 2016-11-04 MED ORDER — FAMOTIDINE IN NACL 20-0.9 MG/50ML-% IV SOLN
INTRAVENOUS | Status: AC
  Administered 2016-11-04: 20 mg
  Filled 2016-11-04: qty 50

## 2016-11-04 MED ORDER — FAMOTIDINE 20 MG PO TABS: 20.0000 mg | ORAL_TABLET | ORAL | Status: DC

## 2016-11-04 MED ORDER — SODIUM CHLORIDE 0.9 % IV SOLN: INTRAVENOUS | Status: AC

## 2016-11-04 MED ORDER — FENTANYL CITRATE (PF) 100 MCG/2ML IJ SOLN
INTRAMUSCULAR | Status: DC | PRN
  Administered 2016-11-04: 50 ug via INTRAVENOUS

## 2016-11-04 MED ORDER — SODIUM CHLORIDE 0.9% FLUSH
3.0000 mL | INTRAVENOUS | Status: DC | PRN
Start: 2016-11-04 — End: 2016-11-04

## 2016-11-04 MED ORDER — FENTANYL CITRATE (PF) 100 MCG/2ML IJ SOLN
INTRAMUSCULAR | Status: AC
  Filled 2016-11-04: qty 2

## 2016-11-04 MED ORDER — LIDOCAINE HCL (PF) 1 % IJ SOLN
INTRAMUSCULAR | Status: DC | PRN
  Administered 2016-11-04: 15 mL

## 2016-11-04 MED ORDER — IOPAMIDOL (ISOVUE-370) INJECTION 76%
INTRAVENOUS | Status: AC
  Filled 2016-11-04: qty 50

## 2016-11-04 MED ORDER — SODIUM CHLORIDE 0.9 % WEIGHT BASED INFUSION
3.0000 mL/kg/h | INTRAVENOUS | Status: AC
  Administered 2016-11-04: 3 mL/kg/h via INTRAVENOUS

## 2016-11-04 MED ORDER — DIPHENHYDRAMINE HCL 50 MG/ML IJ SOLN
INTRAMUSCULAR | Status: AC
  Administered 2016-11-04: 25 mg via INTRAVENOUS
  Filled 2016-11-04: qty 1

## 2016-11-04 MED ORDER — DIPHENHYDRAMINE HCL 50 MG/ML IJ SOLN
25.0000 mg | INTRAMUSCULAR | Status: AC
  Administered 2016-11-04: 25 mg via INTRAVENOUS

## 2016-11-04 SURGICAL SUPPLY — 13 items
CATH INFINITI 5 FR IM (CATHETERS) ×2 IMPLANT
CATH INFINITI 5FR MPB2 (CATHETERS) ×2 IMPLANT
CATH STRAIGHT 5FR 65CM (CATHETERS) ×2 IMPLANT
GUIDEWIRE 3MM J .035 260CM (WIRE) ×2 IMPLANT
GUIDEWIRE ANGLED .035X150CM (WIRE) ×2 IMPLANT
KIT PV (KITS) ×2 IMPLANT
SHEATH PINNACLE 5F 10CM (SHEATH) ×2 IMPLANT
STOPCOCK MORSE 400PSI 3WAY (MISCELLANEOUS) ×2 IMPLANT
TRANSDUCER W/STOPCOCK (MISCELLANEOUS) ×2 IMPLANT
TRAY PV CATH (CUSTOM PROCEDURE TRAY) ×2 IMPLANT
TUBING CIL FLEX 10 FLL-RA (TUBING) ×2 IMPLANT
WIRE HITORQ VERSACORE ST 145CM (WIRE) ×2 IMPLANT
WIRE MINI STICK MAX (SHEATH) ×2 IMPLANT

## 2016-11-04 NOTE — Progress Notes (Signed)
Site area: Insurance underwriter Prior to Removal:  Level 0 Pressure Applied For: 67min Manual:   hand Patient Status During Pull:  A/O Post Pull Site:  Level 0 Post Pull Instructions Given:Post instructions given and pt understands   Post Pull Pulses Present:  2+ Lt dp Dressing Applied:  Tegaderm and a 4x4 Bedrest begins @ 14:10:00 Comments: Pt Leaves cath lab holding in stable condition. Lt groin is unremarkable and dressing to lt groin is CDI.

## 2016-11-04 NOTE — Interval H&P Note (Signed)
History and Physical Interval Note:  11/04/2016 12:08 PM  Justin [name]  has presented today for surgery, with the diagnosis of abnormal stress - cad   The various methods of treatment have been discussed with the patient and family. After consideration of risks, benefits and other options for treatment, the patient has consented to  Procedure(s): Left Heart Cath and Coronary Angiography (N/A) and possible angioplasty Lower Extremity Angiography (N/A) and possible PTA  as a surgical intervention .  The patient's history has been reviewed, patient examined, no change in status, stable for surgery.  I have reviewed the patient's chart and labs.  Questions were answered to the patient's satisfaction.    Ischemic Symptoms? CCS III (Marked limitation of ordinary activity) Anti-ischemic Medical Therapy? Maximal Medical Therapy (2 or more classes of medications) Non-invasive Test Results? Intermediate-risk stress test findings: cardiac mortality 1-3%/year Prior CABG? Previous CABG   Patient Information:   >=1 SVG stenosis  A (8)  Indication: 53; Score: 8   Patient Information:   All bypass grafts patent, >=1 lesion(s) in native coronaries without bypass grafts  A (8)  Indication: 53; Score: 8   Patient Information:   Native 3V-CAD, failure of multiple grafts, depressed LVEF, patent LIMA graft PCI  U (6)  Indication: 68; Score: 6   Patient Information:   Native 3V-CAD, failure of multiple grafts, depressed LVEF, patent LIMA graft CABG  A (7)  Indication: 68; Score: 7   Patient Information:   Native 3V-CAD, failure of multiple grafts, depressed LVEF, nonfunctional LIMA graft PCI  A (8)  Indication: 69; Score: 8   Patient Information:   Native 3V-CAD, failure of multiple grafts, depressed LVEF, nonfunctional LIMA graft CABG  U (6)  Indication: 69; Score: 6  Koehn Salehi

## 2016-11-04 NOTE — Discharge Instructions (Signed)
Femoral Site Care °Introduction °Refer to this sheet in the next few weeks. These instructions provide you with information about caring for yourself after your procedure. Your health care provider may also give you more specific instructions. Your treatment has been planned according to current medical practices, but problems sometimes occur. Call your health care provider if you have any problems or questions after your procedure. °What can I expect after the procedure? °After your procedure, it is typical to have the following: °· Bruising at the site that usually fades within 1-2 weeks. °· Blood collecting in the tissue (hematoma) that may be painful to the touch. It should usually decrease in size and tenderness within 1-2 weeks. °Follow these instructions at home: °· Take medicines only as directed by your health care provider. °· You may shower 24-48 hours after the procedure or as directed by your health care provider. Remove the bandage (dressing) and gently wash the site with plain soap and water. Pat the area dry with a clean towel. Do not rub the site, because this may cause bleeding. °· Do not take baths, swim, or use a hot tub until your health care provider approves. °· Check your insertion site every day for redness, swelling, or drainage. °· Do not apply powder or lotion to the site. °· Limit use of stairs to twice a day for the first 2-3 days or as directed by your health care provider. °· Do not squat for the first 2-3 days or as directed by your health care provider. °· Do not lift over 10 lb (4.5 kg) for 5 days after your procedure or as directed by your health care provider. °· Ask your health care provider when it is okay to: °¨ Return to work or school. °¨ Resume usual physical activities or sports. °¨ Resume sexual activity. °· Do not drive home if you are discharged the same day as the procedure. Have someone else drive you. °· You may drive 24 hours after the procedure unless otherwise  instructed by your health care provider. °· Do not operate machinery or power tools for 24 hours after the procedure or as directed by your health care provider. °· If your procedure was done as an outpatient procedure, which means that you went home the same day as your procedure, a responsible adult should be with you for the first 24 hours after you arrive home. °· Keep all follow-up visits as directed by your health care provider. This is important. °Contact a health care provider if: °· You have a fever. °· You have chills. °· You have increased bleeding from the site. Hold pressure on the site. °Get help right away if: °· You have unusual pain at the site. °· You have redness, warmth, or swelling at the site. °· You have drainage (other than a small amount of blood on the dressing) from the site. °· The site is bleeding, and the bleeding does not stop after 30 minutes of holding steady pressure on the site. °· Your leg or foot becomes pale, cool, tingly, or numb. °This information is not intended to replace advice given to you by your health care provider. Make sure you discuss any questions you have with your health care provider. °Document Released: 05/19/2014 Document Revised: 02/21/2016 Document Reviewed: 04/04/2014 °© 2017 [employer]vier ° °

## 2016-11-05 ENCOUNTER — Encounter (HOSPITAL_COMMUNITY): Payer: Self-pay | Admitting: Cardiology

## 2016-11-06 DIAGNOSIS — R0989 Other specified symptoms and signs involving the circulatory and respiratory systems: Secondary | ICD-10-CM | POA: Diagnosis not present

## 2016-11-12 DIAGNOSIS — I25119 Atherosclerotic heart disease of native coronary artery with unspecified angina pectoris: Secondary | ICD-10-CM | POA: Diagnosis not present

## 2016-11-12 DIAGNOSIS — I739 Peripheral vascular disease, unspecified: Secondary | ICD-10-CM | POA: Diagnosis not present

## 2016-11-12 DIAGNOSIS — I951 Orthostatic hypotension: Secondary | ICD-10-CM | POA: Diagnosis not present

## 2016-11-12 DIAGNOSIS — R0602 Shortness of breath: Secondary | ICD-10-CM | POA: Diagnosis not present

## 2016-12-09 DIAGNOSIS — Z0000 Encounter for general adult medical examination without abnormal findings: Secondary | ICD-10-CM | POA: Diagnosis not present

## 2016-12-09 DIAGNOSIS — R1013 Epigastric pain: Secondary | ICD-10-CM | POA: Diagnosis not present

## 2016-12-09 DIAGNOSIS — I2510 Atherosclerotic heart disease of native coronary artery without angina pectoris: Secondary | ICD-10-CM | POA: Diagnosis not present

## 2016-12-09 DIAGNOSIS — D509 Iron deficiency anemia, unspecified: Secondary | ICD-10-CM | POA: Diagnosis not present

## 2016-12-09 DIAGNOSIS — N400 Enlarged prostate without lower urinary tract symptoms: Secondary | ICD-10-CM | POA: Diagnosis not present

## 2016-12-09 DIAGNOSIS — E118 Type 2 diabetes mellitus with unspecified complications: Secondary | ICD-10-CM | POA: Diagnosis not present

## 2016-12-09 DIAGNOSIS — I10 Essential (primary) hypertension: Secondary | ICD-10-CM | POA: Diagnosis not present

## 2016-12-09 DIAGNOSIS — Z136 Encounter for screening for cardiovascular disorders: Secondary | ICD-10-CM | POA: Diagnosis not present

## 2016-12-09 DIAGNOSIS — M545 Low back pain: Secondary | ICD-10-CM | POA: Diagnosis not present

## 2016-12-09 DIAGNOSIS — E785 Hyperlipidemia, unspecified: Secondary | ICD-10-CM | POA: Diagnosis not present

## 2016-12-16 DIAGNOSIS — N2581 Secondary hyperparathyroidism of renal origin: Secondary | ICD-10-CM | POA: Diagnosis not present

## 2016-12-16 DIAGNOSIS — N183 Chronic kidney disease, stage 3 (moderate): Secondary | ICD-10-CM | POA: Diagnosis not present

## 2016-12-23 DIAGNOSIS — I129 Hypertensive chronic kidney disease with stage 1 through stage 4 chronic kidney disease, or unspecified chronic kidney disease: Secondary | ICD-10-CM | POA: Diagnosis not present

## 2016-12-23 DIAGNOSIS — N183 Chronic kidney disease, stage 3 (moderate): Secondary | ICD-10-CM | POA: Diagnosis not present

## 2016-12-23 DIAGNOSIS — N2581 Secondary hyperparathyroidism of renal origin: Secondary | ICD-10-CM | POA: Diagnosis not present

## 2016-12-23 DIAGNOSIS — D631 Anemia in chronic kidney disease: Secondary | ICD-10-CM | POA: Diagnosis not present

## 2016-12-23 DIAGNOSIS — E669 Obesity, unspecified: Secondary | ICD-10-CM | POA: Diagnosis not present

## 2016-12-24 DIAGNOSIS — I739 Peripheral vascular disease, unspecified: Secondary | ICD-10-CM | POA: Diagnosis not present

## 2016-12-24 DIAGNOSIS — R0602 Shortness of breath: Secondary | ICD-10-CM | POA: Diagnosis not present

## 2016-12-24 DIAGNOSIS — I5032 Chronic diastolic (congestive) heart failure: Secondary | ICD-10-CM | POA: Diagnosis not present

## 2016-12-24 DIAGNOSIS — I25119 Atherosclerotic heart disease of native coronary artery with unspecified angina pectoris: Secondary | ICD-10-CM | POA: Diagnosis not present

## 2017-01-05 MED ORDER — OMEPRAZOLE 20 MG CAP, DELAYED RELEASE
20 mg | ORAL_CAPSULE | ORAL | 3 refills | Status: AC
Start: 2017-01-05 — End: ?

## 2017-01-05 MED ORDER — RAMIPRIL 10 MG CAP
10 mg | ORAL_CAPSULE | ORAL | 3 refills | Status: DC
Start: 2017-01-05 — End: 2017-10-28

## 2017-01-05 MED ORDER — SIMVASTATIN 20 MG TAB
20 mg | ORAL_TABLET | ORAL | 3 refills | Status: AC
Start: 2017-01-05 — End: ?

## 2017-01-08 DIAGNOSIS — D509 Iron deficiency anemia, unspecified: Secondary | ICD-10-CM | POA: Diagnosis not present

## 2017-01-08 DIAGNOSIS — I10 Essential (primary) hypertension: Secondary | ICD-10-CM | POA: Diagnosis not present

## 2017-01-08 DIAGNOSIS — M545 Low back pain: Secondary | ICD-10-CM | POA: Diagnosis not present

## 2017-01-08 DIAGNOSIS — R1013 Epigastric pain: Secondary | ICD-10-CM | POA: Diagnosis not present

## 2017-01-08 DIAGNOSIS — E785 Hyperlipidemia, unspecified: Secondary | ICD-10-CM | POA: Diagnosis not present

## 2017-01-08 DIAGNOSIS — I2510 Atherosclerotic heart disease of native coronary artery without angina pectoris: Secondary | ICD-10-CM | POA: Diagnosis not present

## 2017-01-08 DIAGNOSIS — N400 Enlarged prostate without lower urinary tract symptoms: Secondary | ICD-10-CM | POA: Diagnosis not present

## 2017-01-08 DIAGNOSIS — E118 Type 2 diabetes mellitus with unspecified complications: Secondary | ICD-10-CM | POA: Diagnosis not present

## 2017-02-03 DIAGNOSIS — R1013 Epigastric pain: Secondary | ICD-10-CM | POA: Diagnosis not present

## 2017-02-03 DIAGNOSIS — D509 Iron deficiency anemia, unspecified: Secondary | ICD-10-CM | POA: Diagnosis not present

## 2017-02-03 DIAGNOSIS — I10 Essential (primary) hypertension: Secondary | ICD-10-CM | POA: Diagnosis not present

## 2017-02-03 DIAGNOSIS — E785 Hyperlipidemia, unspecified: Secondary | ICD-10-CM | POA: Diagnosis not present

## 2017-02-03 DIAGNOSIS — M545 Low back pain: Secondary | ICD-10-CM | POA: Diagnosis not present

## 2017-02-03 DIAGNOSIS — E118 Type 2 diabetes mellitus with unspecified complications: Secondary | ICD-10-CM | POA: Diagnosis not present

## 2017-02-03 DIAGNOSIS — N400 Enlarged prostate without lower urinary tract symptoms: Secondary | ICD-10-CM | POA: Diagnosis not present

## 2017-02-03 DIAGNOSIS — R3915 Urgency of urination: Secondary | ICD-10-CM | POA: Diagnosis not present

## 2017-02-03 DIAGNOSIS — I2510 Atherosclerotic heart disease of native coronary artery without angina pectoris: Secondary | ICD-10-CM | POA: Diagnosis not present

## 2017-03-04 DIAGNOSIS — R350 Frequency of micturition: Secondary | ICD-10-CM | POA: Diagnosis not present

## 2017-03-04 DIAGNOSIS — C61 Malignant neoplasm of prostate: Secondary | ICD-10-CM | POA: Diagnosis not present

## 2017-03-04 DIAGNOSIS — N400 Enlarged prostate without lower urinary tract symptoms: Secondary | ICD-10-CM | POA: Diagnosis not present

## 2017-03-10 NOTE — Telephone Encounter (Signed)
Left message for pt to call and set up appt for med refill

## 2017-03-11 MED ORDER — METOPROLOL TARTRATE 50 MG TAB
50 mg | ORAL_TABLET | Freq: Two times a day (BID) | ORAL | 1 refills | Status: DC
Start: 2017-03-11 — End: 2017-04-22

## 2017-03-11 NOTE — Telephone Encounter (Signed)
I will wait on appt status before approving refill on controlled meds. Short fill given for pressure pill.

## 2017-03-20 DIAGNOSIS — R0602 Shortness of breath: Secondary | ICD-10-CM | POA: Diagnosis not present

## 2017-03-20 DIAGNOSIS — E1142 Type 2 diabetes mellitus with diabetic polyneuropathy: Secondary | ICD-10-CM | POA: Diagnosis not present

## 2017-03-20 DIAGNOSIS — I5032 Chronic diastolic (congestive) heart failure: Secondary | ICD-10-CM | POA: Diagnosis not present

## 2017-03-20 DIAGNOSIS — I739 Peripheral vascular disease, unspecified: Secondary | ICD-10-CM | POA: Diagnosis not present

## 2017-04-16 DIAGNOSIS — E785 Hyperlipidemia, unspecified: Secondary | ICD-10-CM | POA: Diagnosis not present

## 2017-04-16 DIAGNOSIS — N400 Enlarged prostate without lower urinary tract symptoms: Secondary | ICD-10-CM | POA: Diagnosis not present

## 2017-04-16 DIAGNOSIS — M545 Low back pain: Secondary | ICD-10-CM | POA: Diagnosis not present

## 2017-04-16 DIAGNOSIS — I2510 Atherosclerotic heart disease of native coronary artery without angina pectoris: Secondary | ICD-10-CM | POA: Diagnosis not present

## 2017-04-16 DIAGNOSIS — I10 Essential (primary) hypertension: Secondary | ICD-10-CM | POA: Diagnosis not present

## 2017-04-16 DIAGNOSIS — R1013 Epigastric pain: Secondary | ICD-10-CM | POA: Diagnosis not present

## 2017-04-16 DIAGNOSIS — F419 Anxiety disorder, unspecified: Secondary | ICD-10-CM | POA: Diagnosis not present

## 2017-04-16 DIAGNOSIS — D509 Iron deficiency anemia, unspecified: Secondary | ICD-10-CM | POA: Diagnosis not present

## 2017-04-16 DIAGNOSIS — E118 Type 2 diabetes mellitus with unspecified complications: Secondary | ICD-10-CM | POA: Diagnosis not present

## 2017-04-23 MED ORDER — METOPROLOL TARTRATE 50 MG TAB
50 mg | ORAL_TABLET | Freq: Two times a day (BID) | ORAL | 0 refills | Status: AC
Start: 2017-04-23 — End: ?

## 2017-05-04 NOTE — Telephone Encounter (Signed)
New pcp

## 2017-05-13 DIAGNOSIS — E1142 Type 2 diabetes mellitus with diabetic polyneuropathy: Secondary | ICD-10-CM | POA: Diagnosis not present

## 2017-05-13 DIAGNOSIS — R0602 Shortness of breath: Secondary | ICD-10-CM | POA: Diagnosis not present

## 2017-05-13 DIAGNOSIS — I5032 Chronic diastolic (congestive) heart failure: Secondary | ICD-10-CM | POA: Diagnosis not present

## 2017-05-13 DIAGNOSIS — I739 Peripheral vascular disease, unspecified: Secondary | ICD-10-CM | POA: Diagnosis not present

## 2017-05-14 DIAGNOSIS — I2510 Atherosclerotic heart disease of native coronary artery without angina pectoris: Secondary | ICD-10-CM | POA: Diagnosis not present

## 2017-05-14 DIAGNOSIS — R1013 Epigastric pain: Secondary | ICD-10-CM | POA: Diagnosis not present

## 2017-05-14 DIAGNOSIS — F419 Anxiety disorder, unspecified: Secondary | ICD-10-CM | POA: Diagnosis not present

## 2017-05-14 DIAGNOSIS — I10 Essential (primary) hypertension: Secondary | ICD-10-CM | POA: Diagnosis not present

## 2017-05-14 DIAGNOSIS — M545 Low back pain: Secondary | ICD-10-CM | POA: Diagnosis not present

## 2017-05-14 DIAGNOSIS — E785 Hyperlipidemia, unspecified: Secondary | ICD-10-CM | POA: Diagnosis not present

## 2017-05-14 DIAGNOSIS — E118 Type 2 diabetes mellitus with unspecified complications: Secondary | ICD-10-CM | POA: Diagnosis not present

## 2017-05-14 DIAGNOSIS — N400 Enlarged prostate without lower urinary tract symptoms: Secondary | ICD-10-CM | POA: Diagnosis not present

## 2017-05-14 DIAGNOSIS — D509 Iron deficiency anemia, unspecified: Secondary | ICD-10-CM | POA: Diagnosis not present

## 2017-05-20 DIAGNOSIS — R1084 Generalized abdominal pain: Secondary | ICD-10-CM | POA: Diagnosis not present

## 2017-05-20 DIAGNOSIS — K439 Ventral hernia without obstruction or gangrene: Secondary | ICD-10-CM | POA: Diagnosis not present

## 2017-05-20 DIAGNOSIS — E669 Obesity, unspecified: Secondary | ICD-10-CM | POA: Diagnosis not present

## 2017-06-29 DIAGNOSIS — N2581 Secondary hyperparathyroidism of renal origin: Secondary | ICD-10-CM | POA: Diagnosis not present

## 2017-06-29 DIAGNOSIS — N183 Chronic kidney disease, stage 3 (moderate): Secondary | ICD-10-CM | POA: Diagnosis not present

## 2017-06-29 DIAGNOSIS — D631 Anemia in chronic kidney disease: Secondary | ICD-10-CM | POA: Diagnosis not present

## 2017-07-07 DIAGNOSIS — N2581 Secondary hyperparathyroidism of renal origin: Secondary | ICD-10-CM | POA: Diagnosis not present

## 2017-07-07 DIAGNOSIS — N183 Chronic kidney disease, stage 3 (moderate): Secondary | ICD-10-CM | POA: Diagnosis not present

## 2017-07-07 DIAGNOSIS — D631 Anemia in chronic kidney disease: Secondary | ICD-10-CM | POA: Diagnosis not present

## 2017-07-07 DIAGNOSIS — I129 Hypertensive chronic kidney disease with stage 1 through stage 4 chronic kidney disease, or unspecified chronic kidney disease: Secondary | ICD-10-CM | POA: Diagnosis not present

## 2017-07-07 DIAGNOSIS — Z23 Encounter for immunization: Secondary | ICD-10-CM | POA: Diagnosis not present

## 2017-07-15 ENCOUNTER — Other Ambulatory Visit: Payer: Self-pay | Admitting: Pharmacist

## 2017-07-15 NOTE — Patient Outreach (Signed)
Outreach call to Justin [name] regarding medication adherence to [employer] for [employer]. Spoke with patient. HIPAA identifiers verified and verbal consent received.  Justin [name] reports that he takes his Januvia daily as directed. Denies any missed doses or barriers to adherence.   Patient does report that he has had a rash on his hands and arms for about 2 weeks now. Reports that he has scheduled an appointment with his PCP and is going in to see him tomorrow.  Denies any medication questions/concerns at this time. Provide patient with my phone number.  Harlow Asa, PharmD, [facility] [phone]

## 2017-07-16 DIAGNOSIS — I10 Essential (primary) hypertension: Secondary | ICD-10-CM | POA: Diagnosis not present

## 2017-07-16 DIAGNOSIS — R1013 Epigastric pain: Secondary | ICD-10-CM | POA: Diagnosis not present

## 2017-07-16 DIAGNOSIS — D509 Iron deficiency anemia, unspecified: Secondary | ICD-10-CM | POA: Diagnosis not present

## 2017-07-16 DIAGNOSIS — E785 Hyperlipidemia, unspecified: Secondary | ICD-10-CM | POA: Diagnosis not present

## 2017-07-16 DIAGNOSIS — I2510 Atherosclerotic heart disease of native coronary artery without angina pectoris: Secondary | ICD-10-CM | POA: Diagnosis not present

## 2017-07-16 DIAGNOSIS — F419 Anxiety disorder, unspecified: Secondary | ICD-10-CM | POA: Diagnosis not present

## 2017-07-16 DIAGNOSIS — E118 Type 2 diabetes mellitus with unspecified complications: Secondary | ICD-10-CM | POA: Diagnosis not present

## 2017-07-16 DIAGNOSIS — M545 Low back pain: Secondary | ICD-10-CM | POA: Diagnosis not present

## 2017-07-16 DIAGNOSIS — N400 Enlarged prostate without lower urinary tract symptoms: Secondary | ICD-10-CM | POA: Diagnosis not present

## 2017-07-20 DIAGNOSIS — R1310 Dysphagia, unspecified: Secondary | ICD-10-CM | POA: Diagnosis not present

## 2017-07-20 DIAGNOSIS — K439 Ventral hernia without obstruction or gangrene: Secondary | ICD-10-CM | POA: Diagnosis not present

## 2017-07-20 DIAGNOSIS — E669 Obesity, unspecified: Secondary | ICD-10-CM | POA: Diagnosis not present

## 2017-07-20 DIAGNOSIS — R05 Cough: Secondary | ICD-10-CM | POA: Diagnosis not present

## 2017-08-06 DIAGNOSIS — D509 Iron deficiency anemia, unspecified: Secondary | ICD-10-CM | POA: Diagnosis not present

## 2017-08-06 DIAGNOSIS — A048 Other specified bacterial intestinal infections: Secondary | ICD-10-CM | POA: Diagnosis not present

## 2017-08-06 DIAGNOSIS — E785 Hyperlipidemia, unspecified: Secondary | ICD-10-CM | POA: Diagnosis not present

## 2017-08-06 DIAGNOSIS — N400 Enlarged prostate without lower urinary tract symptoms: Secondary | ICD-10-CM | POA: Diagnosis not present

## 2017-08-06 DIAGNOSIS — Z01118 Encounter for examination of ears and hearing with other abnormal findings: Secondary | ICD-10-CM | POA: Diagnosis not present

## 2017-08-06 DIAGNOSIS — H538 Other visual disturbances: Secondary | ICD-10-CM | POA: Diagnosis not present

## 2017-08-06 DIAGNOSIS — F419 Anxiety disorder, unspecified: Secondary | ICD-10-CM | POA: Diagnosis not present

## 2017-08-06 DIAGNOSIS — E118 Type 2 diabetes mellitus with unspecified complications: Secondary | ICD-10-CM | POA: Diagnosis not present

## 2017-08-06 DIAGNOSIS — Z0000 Encounter for general adult medical examination without abnormal findings: Secondary | ICD-10-CM | POA: Diagnosis not present

## 2017-08-17 ENCOUNTER — Other Ambulatory Visit: Payer: Self-pay | Admitting: Gastroenterology

## 2017-08-17 DIAGNOSIS — R1084 Generalized abdominal pain: Secondary | ICD-10-CM | POA: Diagnosis not present

## 2017-08-17 DIAGNOSIS — K219 Gastro-esophageal reflux disease without esophagitis: Secondary | ICD-10-CM | POA: Diagnosis not present

## 2017-08-26 ENCOUNTER — Ambulatory Visit
Admission: RE | Admit: 2017-08-26 | Discharge: 2017-08-26 | Disposition: A | Discharge status: Home / Self Care | Payer: Medicare PPO | Source: Ambulatory Visit | Attending: Gastroenterology | Admitting: Gastroenterology

## 2017-08-26 DIAGNOSIS — R1084 Generalized abdominal pain: Secondary | ICD-10-CM

## 2017-08-26 DIAGNOSIS — K8020 Calculus of gallbladder without cholecystitis without obstruction: Secondary | ICD-10-CM | POA: Diagnosis not present

## 2017-09-01 DIAGNOSIS — C61 Malignant neoplasm of prostate: Secondary | ICD-10-CM | POA: Diagnosis not present

## 2017-09-10 DIAGNOSIS — F419 Anxiety disorder, unspecified: Secondary | ICD-10-CM | POA: Diagnosis not present

## 2017-09-10 DIAGNOSIS — E785 Hyperlipidemia, unspecified: Secondary | ICD-10-CM | POA: Diagnosis not present

## 2017-09-10 DIAGNOSIS — E118 Type 2 diabetes mellitus with unspecified complications: Secondary | ICD-10-CM | POA: Diagnosis not present

## 2017-09-10 DIAGNOSIS — I10 Essential (primary) hypertension: Secondary | ICD-10-CM | POA: Diagnosis not present

## 2017-09-10 DIAGNOSIS — I2510 Atherosclerotic heart disease of native coronary artery without angina pectoris: Secondary | ICD-10-CM | POA: Diagnosis not present

## 2017-09-10 DIAGNOSIS — R1013 Epigastric pain: Secondary | ICD-10-CM | POA: Diagnosis not present

## 2017-09-10 DIAGNOSIS — N183 Chronic kidney disease, stage 3 (moderate): Secondary | ICD-10-CM | POA: Diagnosis not present

## 2017-09-10 DIAGNOSIS — N400 Enlarged prostate without lower urinary tract symptoms: Secondary | ICD-10-CM | POA: Diagnosis not present

## 2017-09-10 DIAGNOSIS — D509 Iron deficiency anemia, unspecified: Secondary | ICD-10-CM | POA: Diagnosis not present

## 2017-09-14 ENCOUNTER — Other Ambulatory Visit: Payer: Self-pay | Admitting: Surgery

## 2017-09-14 DIAGNOSIS — K8020 Calculus of gallbladder without cholecystitis without obstruction: Secondary | ICD-10-CM | POA: Diagnosis not present

## 2017-09-16 DIAGNOSIS — R1310 Dysphagia, unspecified: Secondary | ICD-10-CM | POA: Diagnosis not present

## 2017-09-16 DIAGNOSIS — R05 Cough: Secondary | ICD-10-CM | POA: Diagnosis not present

## 2017-09-16 DIAGNOSIS — K8020 Calculus of gallbladder without cholecystitis without obstruction: Secondary | ICD-10-CM | POA: Diagnosis not present

## 2017-09-26 ENCOUNTER — Telehealth: Payer: Self-pay | Admitting: Genetic Counselor

## 2017-09-26 NOTE — Telephone Encounter (Signed)
09/26/17 @ 9:19 am called and spoke with patient to confirm appointment with Roma Kayser for genetic counseling on 11/16/17 @ 1:00 pm.  Patient is aware to arrive 30 minutes prior to appointment for registration.

## 2017-09-30 DIAGNOSIS — N400 Enlarged prostate without lower urinary tract symptoms: Secondary | ICD-10-CM | POA: Diagnosis not present

## 2017-09-30 DIAGNOSIS — C61 Malignant neoplasm of prostate: Secondary | ICD-10-CM | POA: Diagnosis not present

## 2017-10-07 NOTE — Pre-Procedure Instructions (Signed)
Justin [name]  10/07/2017      [facility] [address] [place], [place] [address] Phone: [phone] Fax: [phone]    Your procedure is scheduled on Monday, October 12, 2017  Report to [facility] Admitting Entrance "A" at 5:30AM   Call this number if you have problems the morning of surgery:  [phone]   Remember:  Do not eat food or drink liquids after midnight.  Take these medicines the morning of surgery with A SIP OF WATER: Cilostazol (PLETAL), Isosorbide mononitrate (IMDUR), Metoprolol (LOPRESSOR),  Omeprazole (PRILOSEC), Pantoprazole (PROTONIX), Pregabalin (LYRICA), and Tamsulosin (FLOMAX). If needed ClonazePAM (KLONOPIN) for anxiety, Acetaminophen (TYLENOL) for pain, and NitroGLYCERIN (NITROSTAT) for chest pain (Notify the nurse if you had to take this medicine).  Follow your doctor's instruction regarding Aspirin.  As of today, stop taking all Aspirins, Vitamins, Fish oils, and Herbal medications. Also stop all NSAIDS i.e. Advil, Ibuprofen, Motrin, Aleve, Anaprox, Naproxen, BC and Goody Powders.  How to Manage Your Diabetes Before and After Surgery  Why is it important to control my blood sugar before and after surgery? . Improving blood sugar levels before and after surgery helps healing and can limit problems. . A way of improving blood sugar control is eating a healthy diet by: o  Eating less sugar and carbohydrates o  Increasing activity/exercise o  Talking with your doctor about reaching your blood sugar goals . High blood sugars (greater than 180 mg/dL) can raise your risk of infections and slow your recovery, so you will need to focus on controlling your diabetes during the weeks before surgery. . Make sure that the doctor who takes care of your diabetes knows about your planned surgery including the date and location.  How do I manage my blood sugar before surgery? . Check  your blood sugar at least 4 times a day, starting 2 days before surgery, to make sure that the level is not too high or low. o Check your blood sugar the morning of your surgery when you wake up and every 2 hours until you get to the Short Stay unit. . If your blood sugar is less than 70 mg/dL, you will need to treat for low blood sugar: o Do not take insulin. o Treat a low blood sugar (less than 70 mg/dL) with  cup of clear juice (cranberry or apple), 4 glucose tablets, OR glucose gel. Recheck blood sugar in 15 minutes after treatment (to make sure it is greater than 70 mg/dL). If your blood sugar is not greater than 70 mg/dL on recheck, call [phone] o  for further instructions. . Report your blood sugar to the short stay nurse when you get to Short Stay.  . If you are admitted to the hospital after surgery: o Your blood sugar will be checked by the staff and you will probably be given insulin after surgery (instead of oral diabetes medicines) to make sure you have good blood sugar levels. o The goal for blood sugar control after surgery is 80-180 mg/dL.  WHAT DO I DO ABOUT MY DIABETES MEDICATION?  Marland Kitchen Do not take JANUVIA the morning of surgery.  . THE NIGHT BEFORE SURGERY, take _____15______ units of ___Lantus________insulin.   . THE MORNING OF SURGERY, take _______15______ units of ____Lantus______insulin.  . If your CBG is greater than 220 mg/dL, call us at [phone]   Do not wear jewelry.  Do not wear lotions, powders, colognes,  or deodorant.  Do not shave 48 hours prior to surgery.  Men may shave face and neck.  Do not bring valuables to the hospital.  [facility] is not responsible for any belongings or valuables.  Contacts, dentures or bridgework may not be worn into surgery.  Leave your suitcase in the car.  After surgery it may be brought to your room.  For patients admitted to the hospital, discharge time will be determined by your treatment team.  Patients  discharged the day of surgery will not be allowed to drive home.   Special instructions:   [place]- Preparing For Surgery  Before surgery, you can play an important role. Because skin is not sterile, your skin needs to be as free of germs as possible. You can reduce the number of germs on your skin by washing with CHG (chlorahexidine gluconate) Soap before surgery.  CHG is an antiseptic cleaner which kills germs and bonds with the skin to continue killing germs even after washing.  Please do not use if you have an allergy to CHG or antibacterial soaps. If your skin becomes reddened/irritated stop using the CHG.  Do not shave (including legs and underarms) for at least 48 hours prior to first CHG shower. It is OK to shave your face.  Please follow these instructions carefully.   1. Shower the NIGHT BEFORE SURGERY and the MORNING OF SURGERY with CHG.   2. If you chose to wash your hair, wash your hair first as usual with your normal shampoo.  3. After you shampoo, rinse your hair and body thoroughly to remove the shampoo.  4. Use CHG as you would any other liquid soap. You can apply CHG directly to the skin and wash gently with a scrungie or a clean washcloth.   5. Apply the CHG Soap to your body ONLY FROM THE NECK DOWN.  Do not use on open wounds or open sores. Avoid contact with your eyes, ears, mouth and genitals (private parts). Wash Face and genitals (private parts)  with your normal soap.  6. Wash thoroughly, paying special attention to the area where your surgery will be performed.  7. Thoroughly rinse your body with warm water from the neck down.  8. DO NOT shower/wash with your normal soap after using and rinsing off the CHG Soap.  9. Pat yourself dry with a CLEAN TOWEL.  10. Wear CLEAN PAJAMAS to bed the night before surgery, wear comfortable clothes the morning of surgery  11. Place CLEAN SHEETS on your bed the night of your first shower and DO NOT SLEEP WITH PETS.  Day  of Surgery: Do not apply any deodorants/lotions. Please wear clean clothes to the hospital/surgery center.    Please read over the following fact sheets that you were given. Pain Booklet, Coughing and Deep Breathing and Surgical Site Infection Prevention

## 2017-10-08 ENCOUNTER — Encounter (HOSPITAL_COMMUNITY)
Admission: RE | Admit: 2017-10-08 | Discharge: 2017-10-08 | Disposition: A | Discharge status: Home / Self Care | Payer: Medicare PPO | Source: Ambulatory Visit | Attending: Surgery | Admitting: Surgery

## 2017-10-08 ENCOUNTER — Other Ambulatory Visit: Payer: Self-pay

## 2017-10-08 ENCOUNTER — Encounter (HOSPITAL_COMMUNITY): Payer: Self-pay

## 2017-10-08 DIAGNOSIS — Z7982 Long term (current) use of aspirin: Secondary | ICD-10-CM | POA: Insufficient documentation

## 2017-10-08 DIAGNOSIS — R001 Bradycardia, unspecified: Secondary | ICD-10-CM | POA: Diagnosis not present

## 2017-10-08 DIAGNOSIS — I10 Essential (primary) hypertension: Secondary | ICD-10-CM | POA: Diagnosis not present

## 2017-10-08 DIAGNOSIS — I4510 Unspecified right bundle-branch block: Secondary | ICD-10-CM | POA: Diagnosis not present

## 2017-10-08 DIAGNOSIS — Z01818 Encounter for other preprocedural examination: Secondary | ICD-10-CM | POA: Diagnosis not present

## 2017-10-08 DIAGNOSIS — K8080 Other cholelithiasis without obstruction: Secondary | ICD-10-CM | POA: Insufficient documentation

## 2017-10-08 DIAGNOSIS — Z79899 Other long term (current) drug therapy: Secondary | ICD-10-CM | POA: Diagnosis not present

## 2017-10-08 HISTORY — DX: Unspecified osteoarthritis, unspecified site: M19.90

## 2017-10-08 HISTORY — DX: Gastro-esophageal reflux disease without esophagitis: K21.9

## 2017-10-08 HISTORY — DX: Pneumonia, unspecified organism: J18.9

## 2017-10-08 HISTORY — DX: Dyspnea, unspecified: R06.00

## 2017-10-08 HISTORY — DX: Anxiety disorder, unspecified: F41.9

## 2017-10-08 LAB — BASIC METABOLIC PANEL
Anion gap: 5 (ref 5–15)
BUN: 26 mg/dL — ABNORMAL HIGH (ref 6–20)
CO2: 22 mmol/L (ref 22–32)
Calcium: 9.4 mg/dL (ref 8.9–10.3)
Chloride: 109 mmol/L (ref 101–111)
Creatinine, Ser: 1.51 mg/dL — ABNORMAL HIGH (ref 0.61–1.24)
GFR calc Af Amer: 48 mL/min — ABNORMAL LOW (ref >60)
GFR calc non Af Amer: 41 mL/min — ABNORMAL LOW (ref >60)
Glucose, Bld: 235 mg/dL — ABNORMAL HIGH (ref 65–99)
Potassium: 4.8 mmol/L (ref 3.5–5.1)
Sodium: 136 mmol/L (ref 135–145)

## 2017-10-08 LAB — HEMOGLOBIN A1C
Hgb A1c MFr Bld: 8 % — ABNORMAL HIGH (ref 4.8–5.6)
Mean Plasma Glucose: 182.9 mg/dL

## 2017-10-08 LAB — CBC
HCT: 43 % (ref 39.0–52.0)
Hemoglobin: 13.7 g/dL (ref 13.0–17.0)
MCH: 26.9 pg (ref 26.0–34.0)
MCHC: 31.9 g/dL (ref 30.0–36.0)
MCV: 84.3 fL (ref 78.0–100.0)
Platelets: 159 10*3/uL (ref 150–400)
RBC: 5.1 MIL/uL (ref 4.22–5.81)
RDW: 15.4 % (ref 11.5–15.5)
WBC: 8.2 10*3/uL (ref 4.0–10.5)

## 2017-10-08 LAB — GLUCOSE, CAPILLARY: Glucose-Capillary: 229 mg/dL — ABNORMAL HIGH (ref 65–99)

## 2017-10-08 NOTE — Progress Notes (Addendum)
PCP - Dr. Orma Render Cardiologist - Dr. Einar Gip  Patient stated prior to moving to [place] 2 years ago, his cardiologist in [place] was Dr. Tyson Babinski, also requesting records from his office.  Chest x-ray - n/a EKG - 10/08/2017 Stress Test - patient unsure date of last one, daughter states it was last year, requesting records from Dr. Einar Gip ECHO - patient unsure Cardiac Cath - 11/04/2016  Sleep Study - patient denies   Fasting Blood Sugar - 80-130's Checks Blood Sugar 1 time a day   Aspirin Instructions: patient instructed to stop beginning today, unless told otherwise by the surgeon and  prescribing physician  Anesthesia review: yes, history of CAD and CABG  Patient denies shortness of breath, fever, cough and chest pain at PAT appointment   Patient verbalized understanding of instructions that were given to them at the PAT appointment. Patient was also instructed that they will need to review over the PAT instructions again at home before surgery.

## 2017-10-09 NOTE — Progress Notes (Signed)
Anesthesia Chart Review:  Pt is an 84 year old male scheduled for laparoscopic cholecystectomy on 10/12/2017 with [name], MD  - PCP is [name], MD - Cardiologist is Kela Millin, MD. Last office visit 05/13/17  PMH includes:   CAD (s/p CABG 2009), HTN, DM, hyperlipidemia, prostate cancer, GERD. Former smoker. BMI 31.5.  Medications include: ASA 81 mg, chlorthalidone, Pletal, Imdur, Januvia, Lantus, metoprolol, Prilosec, Protonix, ramipril, simvastatin  BP (!) 138/58   Pulse 65   Temp 36.5 C   Resp 20   Ht 5' 7.5" (1.715 m)   Wt 203 lb 11.2 oz (92.4 kg)   BMI 31.43 kg/m   Preoperative labs reviewed.   - HbA1c 8.0, glucose 235  EKG 10/08/17: Sinus bradycardia (55 bpm). RBBB  Cardiac cath 11/04/16:  - RCA: dominant, small and diffusely diseased. Distal RCA has a 70% stenosis, small vessel, measures approximately 2 mm. Small by mouth branch. - SVG to RCA: Appears occluded. - LM: Widely patent. Mild calcification. - LAD: Large vessel, calcified, 80-90% stenosis in the midsegment. Faint complaint of feeling evident in the distal LAD. Moderate-sized D1. - LIMA to LAD: Widely patent. - CX: Moderate size vessel. OM1 is large but is occluded in the proximal segment. OM 2 is large but diffusely diseased and small caliber. Mild diffuse luminal irregularities present in the circumflex. - SVG to ramus intermediate and OM1: Widely patent. - Ramus intermediate: He is occluded in the proximal segment. Probable SVG to ramus is widely patent.  Echo 04/11/16 ([facility]): - Mild concentric LVH. Normal LV wall motion. EF 54%. Grade 1 LV diastolic dysfunction. - Normal RV size and contractility. - RA normal in size. LA mildly dilated - Aortic root normal in size. Aortic valve mildly calcified - Tricuspid, pulmonic, aortic, and mitral valves morphologically normal and open adequately. Mitral valve mildly thickened.  If no changes, I anticipate pt can proceed with  surgery as scheduled.   Willeen Cass, FNP-BC [facility] Short Stay Surgical Center/Anesthesiology Phone: [phone] 10/09/2017 9:49 AM

## 2017-10-11 NOTE — H&P (Signed)
Jonetta Osgood Documented: 09/14/2017 11:07 AM Location: [facility] Patient #: [id number] DOB: [birth year] Widowed / Language: Cleophus Molt / Race: Black or African American Male   History of Present Illness Nathaneil Canary A. Ninfa Linden MD; 09/14/2017 11:31 AM) The patient is a 84 year old male who presents for evaluation of gall stones. This gentleman is referred by Dr. [name] for symptomatic cholelithiasis. For almost a year now he has been having abdominal bloating. It occurs after just about anything he eats. He has nausea with this. He has not had emesis although he feels like he wasn't thrown up several times. His bowel movements are normal. He has had an upper endoscopy which was fairly unremarkable. He has had an ultrasound showing multiple gallstones. Most of this pain is sharp, burning, and moderate in intensity. He remains in the epigastric area. There has been no relief with antacid medications. He is otherwise without complaints.   Past Surgical History (Tanisha A. Owens Shark, [place]; 09/14/2017 11:07 AM) Bypass Surgery for Poor Blood Flow to Legs  Colon Polyp Removal - Colonoscopy  Coronary Artery Bypass Graft  Laparoscopic Inguinal Hernia Surgery  Bilateral. Resection of Small Bowel  TURP   Diagnostic Studies History (Tanisha A. Owens Shark, [place]; 09/14/2017 11:07 AM) Colonoscopy  1-5 years ago  Allergies (Tanisha A. Brown, RMA; 09/14/2017 11:08 AM) [name] 40 ([place]ine Red) *PHARMACEUTICAL ADJUVANTS*  Ibuprofen *ANALGESICS - ANTI-INFLAMMATORY*  Allergies Reconciled   Medication History (Tanisha A. Owens Shark, RMA; 09/14/2017 11:12 AM) Amitiza (24MCG Capsule, Oral) Active. Aspirin (81MG  Tablet, Oral) Active. Chlorthalidone (50MG  Tablet, Oral) Active. Vitamin D (400UNIT Tablet, Oral) Active. ClonazePAM (0.5MG  Tablet, Oral) Active. Cilostazol (100MG  Tablet, Oral) Active. Isosorbide Mononitrate ER (60MG  Tablet ER 24HR, Oral) Active. Januvia (100MG   Tablet, Oral) Active. Lantus SoloStar (100UNIT/ML Soln Pen-inj, Subcutaneous) Active. Metoprolol Tartrate (50MG  Tablet, Oral) Active. Tamsulosin HCl (0.4MG  Capsule, Oral) Active. Simvastatin (20MG  Tablet, Oral) Active. Ramipril (10MG  Capsule, Oral) Active. Omeprazole (20MG  Capsule DR, Oral) Active. Nitroglycerin (0.4MG  Tab Sublingual, Sublingual) Active. Medications Reconciled  Social History (Tanisha A. Owens Shark, [place]; 09/14/2017 11:07 AM) Caffeine use  Carbonated beverages, Coffee. No drug use  Tobacco use  Never smoker.  Family History (Tanisha A. Owens Shark, [place]; 09/14/2017 11:07 AM) Diabetes Mellitus  Family Members In General, Father.  Other Problems (Tanisha A. Owens Shark, [place]; 09/14/2017 11:07 AM) Arthritis  Back Pain  Congestive Heart Failure  Diabetes Mellitus  Diverticulosis  Enlarged Prostate  Gastroesophageal Reflux Disease  High blood pressure  Hypercholesterolemia  Inguinal Hernia  Prostate Cancer  Pulmonary Embolism / Blood Clot in Legs  Transfusion history     Review of Systems (Tanisha A. Brown RMA; 09/14/2017 11:07 AM) General Present- Appetite Loss, Chills, Fatigue and Weight Gain. Not Present- Fever, Night Sweats and Weight Loss. Skin Present- Rash. Not Present- Change in Wart/Mole, Dryness, Hives, Jaundice, New Lesions, Non-Healing Wounds and Ulcer. HEENT Present- Hearing Loss, Ringing in the Ears and Wears glasses/contact lenses. Not Present- Earache, Hoarseness, Nose Bleed, Oral Ulcers, Seasonal Allergies, Sinus Pain, Sore Throat, Visual Disturbances and Yellow Eyes. Respiratory Not Present- Bloody sputum, Chronic Cough, Difficulty Breathing, Snoring and Wheezing. Breast Not Present- Breast Mass, Breast Pain, Nipple Discharge and Skin Changes. Cardiovascular Present- Swelling of Extremities. Not Present- Chest Pain, Difficulty Breathing Lying Down, Leg Cramps, Palpitations, Rapid Heart Rate and Shortness of Breath. Gastrointestinal Present-  Abdominal Pain, Bloating, Change in Bowel Habits, Chronic diarrhea, Constipation, Difficulty Swallowing, Excessive gas, Gets full quickly at meals, Indigestion and Nausea. Not Present- Bloody Stool, Hemorrhoids, Rectal Pain and  Vomiting. Male Genitourinary Present- Change in Urinary Stream, Impotence, Nocturia and Urine Leakage. Not Present- Blood in Urine, Frequency, Painful Urination and Urgency. Musculoskeletal Present- Back Pain, Joint Pain, Joint Stiffness and Swelling of Extremities. Not Present- Muscle Pain and Muscle Weakness. Neurological Present- Trouble walking. Not Present- Decreased Memory, Fainting, Headaches, Numbness, Seizures, Tingling, Tremor and Weakness. Endocrine Not Present- Cold Intolerance, Excessive Hunger, Hair Changes, Heat Intolerance, Hot flashes and New Diabetes. Hematology Not Present- Blood Thinners, Easy Bruising, Excessive bleeding, Gland problems, HIV and Persistent Infections.  Vitals (Tanisha A. Brown RMA; 09/14/2017 11:08 AM) 09/14/2017 11:08 AM Weight: 202.5 lb Height: 67.5in Body Surface Area: 2.04 m Body Mass Index: 31.25 kg/m  Temp.: 98.39F  Pulse: 65 (Regular)  BP: 142/92 (Sitting, Left Arm, Standard)       Physical Exam (Ludwin Flahive A. Ninfa Linden MD; 09/14/2017 11:31 AM) General Mental Status-Alert. General Appearance-Consistent with stated age. Hydration-Well hydrated. Voice-Normal.  Head and Neck Head-normocephalic, atraumatic with no lesions or palpable masses.  Eye Eyeball - Bilateral-Extraocular movements intact. Sclera/Conjunctiva - Bilateral-No scleral icterus.  Chest and Lung Exam Chest and lung exam reveals -quiet, even and easy respiratory effort with no use of accessory muscles and on auscultation, normal breath sounds, no adventitious sounds and normal vocal resonance. Inspection Chest Wall - Normal. Back - normal.  Cardiovascular Cardiovascular examination reveals -on palpation PMI is normal in  location and amplitude, no palpable S3 or S4. Normal cardiac borders., normal heart sounds, regular rate and rhythm with no murmurs, carotid auscultation reveals no bruits and normal pedal pulses bilaterally.  Abdomen Inspection Inspection of the abdomen reveals - No Hernias. Skin - Scar - no surgical scars. Palpation/Percussion Palpation and Percussion of the abdomen reveal - Soft, Non Tender, No Rebound tenderness, No Rigidity (guarding) and No hepatosplenomegaly. Auscultation Auscultation of the abdomen reveals - Bowel sounds normal. Note: He has a large midline incision and lower abdomen going just above the umbilicus. His abdomen is nontender today   Neurologic Neurologic evaluation reveals -alert and oriented x 3 with no impairment of recent or remote memory. Mental Status-Normal.  Musculoskeletal Normal Exam - Left-Upper Extremity Strength Normal and Lower Extremity Strength Normal. Normal Exam - Right-Upper Extremity Strength Normal, Lower Extremity Weakness.    Assessment & Plan (Aysel Gilchrest A. Ninfa Linden MD; 09/14/2017 11:32 AM) SYMPTOMATIC CHOLELITHIASIS (K80.20) Impression: Based on his symptoms and workup, I do believe this is symptomatic cholelithiasis. I discussed gallbladder surgery with the patient and his wife. She is actually had her gallbladder removed for similar reasons. I given literature regarding the surgery. We discussed the surgery in detail. We discussed the risk which includes but is not limited to bleeding, infection, injury to surrounding structures, the need to convert to an open procedure, etc.

## 2017-10-11 NOTE — Anesthesia Preprocedure Evaluation (Addendum)
Anesthesia Evaluation  Patient identified by MRN, date of birth, ID band Patient awake    Reviewed: Allergy & Precautions, NPO status , Patient's Chart, lab work & pertinent test results, reviewed documented beta blocker date and time   Airway Mallampati: III  TM Distance: >3 FB Neck ROM: Full    Dental no notable dental hx.    Pulmonary former smoker,    Pulmonary exam normal breath sounds clear to auscultation       Cardiovascular hypertension, Pt. on medications and Pt. on home beta blockers + CAD and + CABG (2009)  Normal cardiovascular exam Rhythm:Regular Rate:Normal  ECG: SB, rate 55. RBBB  Sees cardiologist Einar Gip)  The right coronary artery stenosis correlates with the ischemia on the stress test. However the vessel is small and ischemic burden on [place] since small consulted to medical therapy. With regard to peripheral arterial disease, he has diabetic vascular disease with small vessel involvement. Aggressive medical therapy with diet, exercise and lipid control along with diabetes control is indicated. He'll be discharged home with outpatient management.   He had undergone DEXA scan Myoview stress test on 04/17/2016 which revealed moderate sized inferior and inferoapical scar with mild peri-infarct ischemia towards the apex, and the depressed at 36%, intermediate to study.   Neuro/Psych Anxiety negative neurological ROS     GI/Hepatic Neg liver ROS, GERD  Medicated,  Endo/Other  diabetes, Oral Hypoglycemic Agents, Insulin Dependent  Renal/GU negative Renal ROS     Musculoskeletal negative musculoskeletal ROS (+)   Abdominal (+) + obese,   Peds  Hematology HLD   Anesthesia Other Findings SYMPTOMATIC GALLSTONES  Reproductive/Obstetrics                            Anesthesia Physical Anesthesia Plan  ASA: III  Anesthesia Plan: General   Post-op Pain Management:    Induction:  Intravenous  PONV Risk Score and Plan: 3 and Dexamethasone, Ondansetron and Treatment may vary due to age or medical condition  Airway Management Planned: Oral ETT  Additional Equipment:   Intra-op Plan:   Post-operative Plan: Extubation in OR  Informed Consent: I have reviewed the patients History and Physical, chart, labs and discussed the procedure including the risks, benefits and alternatives for the proposed anesthesia with the patient or authorized representative who has indicated his/her understanding and acceptance.   Dental advisory given  Plan Discussed with: CRNA  Anesthesia Plan Comments:       Anesthesia Quick Evaluation

## 2017-10-12 ENCOUNTER — Ambulatory Visit (HOSPITAL_COMMUNITY)
Admission: RE | Admit: 2017-10-12 | Discharge: 2017-10-12 | Disposition: A | Discharge status: Home / Self Care | Payer: Medicare PPO | Source: Ambulatory Visit | Attending: Surgery | Admitting: Surgery

## 2017-10-12 ENCOUNTER — Encounter (HOSPITAL_COMMUNITY): Payer: Self-pay | Admitting: *Deleted

## 2017-10-12 ENCOUNTER — Ambulatory Visit (HOSPITAL_COMMUNITY): Payer: Medicare PPO | Admitting: Emergency Medicine

## 2017-10-12 ENCOUNTER — Ambulatory Visit (HOSPITAL_COMMUNITY): Payer: Medicare PPO | Admitting: Anesthesiology

## 2017-10-12 ENCOUNTER — Encounter (HOSPITAL_COMMUNITY)
Admission: RE | Disposition: A | Discharge status: Home / Self Care | Payer: Self-pay | Source: Ambulatory Visit | Attending: Surgery

## 2017-10-12 DIAGNOSIS — K219 Gastro-esophageal reflux disease without esophagitis: Secondary | ICD-10-CM | POA: Insufficient documentation

## 2017-10-12 DIAGNOSIS — Z7982 Long term (current) use of aspirin: Secondary | ICD-10-CM | POA: Diagnosis not present

## 2017-10-12 DIAGNOSIS — I10 Essential (primary) hypertension: Secondary | ICD-10-CM | POA: Diagnosis not present

## 2017-10-12 DIAGNOSIS — Z87891 Personal history of nicotine dependence: Secondary | ICD-10-CM | POA: Insufficient documentation

## 2017-10-12 DIAGNOSIS — I2510 Atherosclerotic heart disease of native coronary artery without angina pectoris: Secondary | ICD-10-CM | POA: Insufficient documentation

## 2017-10-12 DIAGNOSIS — K8010 Calculus of gallbladder with chronic cholecystitis without obstruction: Secondary | ICD-10-CM | POA: Insufficient documentation

## 2017-10-12 DIAGNOSIS — Z79899 Other long term (current) drug therapy: Secondary | ICD-10-CM | POA: Diagnosis not present

## 2017-10-12 DIAGNOSIS — Z951 Presence of aortocoronary bypass graft: Secondary | ICD-10-CM | POA: Insufficient documentation

## 2017-10-12 DIAGNOSIS — E119 Type 2 diabetes mellitus without complications: Secondary | ICD-10-CM | POA: Diagnosis not present

## 2017-10-12 DIAGNOSIS — K8020 Calculus of gallbladder without cholecystitis without obstruction: Secondary | ICD-10-CM | POA: Diagnosis not present

## 2017-10-12 DIAGNOSIS — Z794 Long term (current) use of insulin: Secondary | ICD-10-CM | POA: Insufficient documentation

## 2017-10-12 DIAGNOSIS — I209 Angina pectoris, unspecified: Secondary | ICD-10-CM | POA: Diagnosis not present

## 2017-10-12 HISTORY — PX: CHOLECYSTECTOMY: SHX55

## 2017-10-12 LAB — GLUCOSE, CAPILLARY
Glucose-Capillary: 132 mg/dL — ABNORMAL HIGH (ref 65–99)
Glucose-Capillary: 144 mg/dL — ABNORMAL HIGH (ref 65–99)

## 2017-10-12 SURGERY — LAPAROSCOPIC CHOLECYSTECTOMY
Anesthesia: General | Site: Abdomen

## 2017-10-12 MED ORDER — BUPIVACAINE-EPINEPHRINE (PF) 0.5% -1:200000 IJ SOLN
INTRAMUSCULAR | Status: AC
  Filled 2017-10-12: qty 30

## 2017-10-12 MED ORDER — ONDANSETRON HCL 4 MG/2ML IJ SOLN
INTRAMUSCULAR | Status: DC | PRN
  Administered 2017-10-12: 4 mg via INTRAVENOUS

## 2017-10-12 MED ORDER — SUGAMMADEX SODIUM 200 MG/2ML IV SOLN
INTRAVENOUS | Status: DC | PRN
  Administered 2017-10-12: 200 mg via INTRAVENOUS

## 2017-10-12 MED ORDER — PHENYLEPHRINE HCL 10 MG/ML IJ SOLN
INTRAVENOUS | Status: DC | PRN
  Administered 2017-10-12: 100 ug/min via INTRAVENOUS

## 2017-10-12 MED ORDER — OXYCODONE HCL 5 MG PO TABS: 5.0000 mg | ORAL_TABLET | ORAL | Status: DC | PRN

## 2017-10-12 MED ORDER — CHLORHEXIDINE GLUCONATE CLOTH 2 % EX PADS: 6.0000 | MEDICATED_PAD | Freq: Once | CUTANEOUS | Status: DC

## 2017-10-12 MED ORDER — FENTANYL CITRATE (PF) 100 MCG/2ML IJ SOLN
INTRAMUSCULAR | Status: DC | PRN
  Administered 2017-10-12: 100 ug via INTRAVENOUS

## 2017-10-12 MED ORDER — PROMETHAZINE HCL 25 MG/ML IJ SOLN: 6.2500 mg | INTRAMUSCULAR | Status: DC | PRN

## 2017-10-12 MED ORDER — CEFAZOLIN SODIUM-DEXTROSE 2-4 GM/100ML-% IV SOLN
2.0000 g | INTRAVENOUS | Status: AC
  Administered 2017-10-12: 2 g via INTRAVENOUS

## 2017-10-12 MED ORDER — 0.9 % SODIUM CHLORIDE (POUR BTL) OPTIME
TOPICAL | Status: DC | PRN
  Administered 2017-10-12: 1000 mL

## 2017-10-12 MED ORDER — EPHEDRINE SULFATE 50 MG/ML IJ SOLN
INTRAMUSCULAR | Status: DC | PRN
  Administered 2017-10-12 (×2): 10 mg via INTRAVENOUS

## 2017-10-12 MED ORDER — PHENYLEPHRINE HCL 10 MG/ML IJ SOLN
INTRAMUSCULAR | Status: DC | PRN
  Administered 2017-10-12 (×2): 80 ug via INTRAVENOUS
  Administered 2017-10-12: 160 ug via INTRAVENOUS

## 2017-10-12 MED ORDER — PROPOFOL 10 MG/ML IV BOLUS
INTRAVENOUS | Status: AC
  Filled 2017-10-12: qty 20

## 2017-10-12 MED ORDER — BUPIVACAINE-EPINEPHRINE 0.5% -1:200000 IJ SOLN
INTRAMUSCULAR | Status: DC | PRN
  Administered 2017-10-12: 20 mL

## 2017-10-12 MED ORDER — PROPOFOL 10 MG/ML IV BOLUS
INTRAVENOUS | Status: DC | PRN
  Administered 2017-10-12: 150 mg via INTRAVENOUS

## 2017-10-12 MED ORDER — SODIUM CHLORIDE 0.9 % IR SOLN
Status: DC | PRN
  Administered 2017-10-12: 1000 mL

## 2017-10-12 MED ORDER — OXYCODONE HCL 5 MG PO TABS: 5.0000 mg | ORAL_TABLET | Freq: Four times a day (QID) | ORAL | 0 refills | Status: DC | PRN

## 2017-10-12 MED ORDER — FENTANYL CITRATE (PF) 250 MCG/5ML IJ SOLN
INTRAMUSCULAR | Status: AC
  Filled 2017-10-12: qty 5

## 2017-10-12 MED ORDER — LIDOCAINE 2% (20 MG/ML) 5 ML SYRINGE
INTRAMUSCULAR | Status: AC
  Filled 2017-10-12: qty 5

## 2017-10-12 MED ORDER — ROCURONIUM BROMIDE 100 MG/10ML IV SOLN
INTRAVENOUS | Status: DC | PRN
  Administered 2017-10-12: 40 mg via INTRAVENOUS

## 2017-10-12 MED ORDER — ROCURONIUM BROMIDE 10 MG/ML (PF) SYRINGE
PREFILLED_SYRINGE | INTRAVENOUS | Status: AC
  Filled 2017-10-12: qty 5

## 2017-10-12 MED ORDER — LIDOCAINE HCL (CARDIAC) 20 MG/ML IV SOLN
INTRAVENOUS | Status: DC | PRN
  Administered 2017-10-12: 60 mg via INTRAVENOUS

## 2017-10-12 MED ORDER — LACTATED RINGERS IV SOLN
INTRAVENOUS | Status: DC | PRN
  Administered 2017-10-12 (×2): via INTRAVENOUS

## 2017-10-12 MED ORDER — BUPIVACAINE HCL (PF) 0.25 % IJ SOLN
INTRAMUSCULAR | Status: AC
  Filled 2017-10-12: qty 30

## 2017-10-12 MED ORDER — HYDROMORPHONE HCL 1 MG/ML IJ SOLN: 0.2500 mg | INTRAMUSCULAR | Status: DC | PRN

## 2017-10-12 SURGICAL SUPPLY — 31 items
APPLIER CLIP 5 13 M/L LIGAMAX5 (MISCELLANEOUS) ×2
CANISTER SUCT 3000ML PPV (MISCELLANEOUS) ×2 IMPLANT
CHLORAPREP W/TINT 26ML (MISCELLANEOUS) ×2 IMPLANT
CLIP APPLIE 5 13 M/L LIGAMAX5 (MISCELLANEOUS) ×1 IMPLANT
COVER SURGICAL LIGHT HANDLE (MISCELLANEOUS) ×2 IMPLANT
DERMABOND ADVANCED (GAUZE/BANDAGES/DRESSINGS) ×1
DERMABOND ADVANCED .7 DNX12 (GAUZE/BANDAGES/DRESSINGS) ×1 IMPLANT
ELECT REM PT RETURN 9FT ADLT (ELECTROSURGICAL) ×2
ELECTRODE REM PT RTRN 9FT ADLT (ELECTROSURGICAL) ×1 IMPLANT
GLOVE SURG SIGNA 7.5 PF LTX (GLOVE) ×2 IMPLANT
GOWN STRL REUS W/ TWL LRG LVL3 (GOWN DISPOSABLE) ×2 IMPLANT
GOWN STRL REUS W/ TWL XL LVL3 (GOWN DISPOSABLE) ×1 IMPLANT
GOWN STRL REUS W/TWL LRG LVL3 (GOWN DISPOSABLE) ×2
GOWN STRL REUS W/TWL XL LVL3 (GOWN DISPOSABLE) ×1
KIT BASIN OR (CUSTOM PROCEDURE TRAY) ×2 IMPLANT
KIT ROOM TURNOVER OR (KITS) ×2 IMPLANT
NS IRRIG 1000ML POUR BTL (IV SOLUTION) ×2 IMPLANT
PAD ARMBOARD 7.5X6 YLW CONV (MISCELLANEOUS) ×2 IMPLANT
POUCH SPECIMEN RETRIEVAL 10MM (ENDOMECHANICALS) ×2 IMPLANT
SCISSORS LAP 5X35 DISP (ENDOMECHANICALS) ×2 IMPLANT
SET IRRIG TUBING LAPAROSCOPIC (IRRIGATION / IRRIGATOR) ×2 IMPLANT
SLEEVE ENDOPATH XCEL 5M (ENDOMECHANICALS) ×4 IMPLANT
SPECIMEN JAR SMALL (MISCELLANEOUS) ×2 IMPLANT
SUT MNCRL AB 4-0 PS2 18 (SUTURE) ×2 IMPLANT
TOWEL OR 17X24 6PK STRL BLUE (TOWEL DISPOSABLE) ×2 IMPLANT
TOWEL OR 17X26 10 PK STRL BLUE (TOWEL DISPOSABLE) ×2 IMPLANT
TRAY LAPAROSCOPIC MC (CUSTOM PROCEDURE TRAY) ×2 IMPLANT
TROCAR XCEL BLUNT TIP 100MML (ENDOMECHANICALS) ×2 IMPLANT
TROCAR XCEL NON-BLD 5MMX100MML (ENDOMECHANICALS) ×2 IMPLANT
TUBING INSUFFLATION (TUBING) ×2 IMPLANT
WATER STERILE IRR 1000ML POUR (IV SOLUTION) ×2 IMPLANT

## 2017-10-12 NOTE — Anesthesia Postprocedure Evaluation (Signed)
Anesthesia Post Note  Patient: Justin [name]  Procedure(s) Performed: LAPAROSCOPIC CHOLECYSTECTOMY (N/A Abdomen)     Patient location during evaluation: PACU Anesthesia Type: General Level of consciousness: awake and alert Pain management: pain level controlled Vital Signs Assessment: post-procedure vital signs reviewed and stable Respiratory status: spontaneous breathing, nonlabored ventilation, respiratory function stable and patient connected to nasal cannula oxygen Cardiovascular status: blood pressure returned to baseline and stable Postop Assessment: no apparent nausea or vomiting Anesthetic complications: no    Last Vitals:  Vitals:   10/12/17 0900 10/12/17 0915  BP: (!) 137/55 (!) 157/67  Pulse: (!) 54 (!) 50  Resp: 19 20  Temp: (!) 36.3 C   SpO2: 92% 94%    Last Pain:  Vitals:   10/12/17 0915  TempSrc:   PainSc: 0-No pain                 Ernestina Joe P Fadi Menter

## 2017-10-12 NOTE — Discharge Instructions (Signed)
[facility] ______[facility] LAPAROSCOPIC SURGERY: POST OP INSTRUCTIONS Always review your discharge instruction sheet given to you by the facility where your surgery was performed. IF YOU HAVE DISABILITY OR FAMILY LEAVE FORMS, YOU MUST BRING THEM TO THE OFFICE FOR PROCESSING.   DO NOT GIVE THEM TO YOUR DOCTOR.  1. A prescription for pain medication may be given to you upon discharge.  Take your pain medication as prescribed, if needed.  If narcotic pain medicine is not needed, then you may take acetaminophen (Tylenol) or ibuprofen (Advil) as needed. 2. Take your usually prescribed medications unless otherwise directed. 3. If you need a refill on your pain medication, please contact your pharmacy.  They will contact our office to request authorization. Prescriptions will not be filled after 5pm or on week-ends. 4. You should follow a light diet the first few days after arrival home, such as soup and crackers, etc.  Be sure to include lots of fluids daily. 5. Most patients will experience some swelling and bruising in the area of the incisions.  Ice packs will help.  Swelling and bruising can take several days to resolve.  6. It is common to experience some constipation if taking pain medication after surgery.  Increasing fluid intake and taking a stool softener (such as Colace) will usually help or prevent this problem from occurring.  A mild laxative (Milk of Magnesia or Miralax) should be taken according to package instructions if there are no bowel movements after 48 hours. 7. Unless discharge instructions indicate otherwise, you may remove your bandages 24-48 hours after surgery, and you may shower at that time.  You may have steri-strips (small skin tapes) in place directly over the incision.  These strips should be left on the skin for 7-10 days.  If your surgeon used skin glue on the incision, you may shower in 24 hours.  The glue will flake off over the next 2-3 weeks.  Any sutures or  staples will be removed at the office during your follow-up visit. 8. ACTIVITIES:  You may resume regular (light) daily activities beginning the next day--such as daily self-care, walking, climbing stairs--gradually increasing activities as tolerated.  You may have sexual intercourse when it is comfortable.  Refrain from any heavy lifting or straining until approved by your doctor. a. You may drive when you are no longer taking prescription pain medication, you can comfortably wear a seatbelt, and you can safely maneuver your car and apply brakes. b. RETURN TO WORK:  __________________________________________________________ 9. You should see your doctor in the office for a follow-up appointment approximately 2-3 weeks after your surgery.  Make sure that you call for this appointment within a day or two after you arrive home to insure a convenient appointment time. 10. OTHER INSTRUCTIONS:OK TO SHOWER STARTING TOMORROW 11. ICE PACK, TYLENOL, AND IBUPROFEN ALSO FOR PAIN __________________________________________________________________________________________________________________________ __________________________________________________________________________________________________________________________ WHEN TO CALL YOUR DOCTOR: 1. Fever over 101.0 2. Inability to urinate 3. Continued bleeding from incision. 4. Increased pain, redness, or drainage from the incision. 5. Increasing abdominal pain  The clinic staff is available to answer your questions during regular business hours.  Please dont hesitate to call and ask to speak to one of the nurses for clinical concerns.  If you have a medical emergency, go to the nearest emergency room or call 911.  A surgeon from [facility] is always on call at the hospital. [address], [place], [place], [place]  [address] ? [address] [address], [place], [place]  [address] [phone] ? [phone] ? FAX [phone] Web site:  [email or web address]

## 2017-10-12 NOTE — Anesthesia Procedure Notes (Signed)
Procedure Name: Intubation Date/Time: 10/12/2017 7:30 AM Performed by: Purvis Kilts, CRNA Pre-anesthesia Checklist: Patient identified, Emergency Drugs available, Suction available, Patient being monitored and Timeout performed Patient Re-evaluated:Patient Re-evaluated prior to induction Oxygen Delivery Method: Circle system utilized Preoxygenation: Pre-oxygenation with 100% oxygen Induction Type: IV induction Ventilation: Mask ventilation without difficulty Laryngoscope Size: Mac and 4 Grade View: Grade II Tube type: Oral Tube size: 7.5 mm Number of attempts: 1 Airway Equipment and Method: Stylet Placement Confirmation: ETT inserted through vocal cords under direct vision,  positive ETCO2 and breath sounds checked- equal and bilateral Secured at: 22 cm Tube secured with: Tape Dental Injury: Teeth and Oropharynx as per pre-operative assessment

## 2017-10-12 NOTE — Transfer of Care (Signed)
Immediate Anesthesia Transfer of Care Note  Patient: Justin [name]  Procedure(s) Performed: LAPAROSCOPIC CHOLECYSTECTOMY (N/A Abdomen)  Patient Location: PACU  Anesthesia Type:General  Level of Consciousness: awake  Airway & Oxygen Therapy: Patient Spontanous Breathing  Post-op Assessment: Report given to RN and Post -op Vital signs reviewed and stable  Post vital signs: Reviewed and stable  Last Vitals:  Vitals:   10/12/17 0812 10/12/17 0815  BP: (!) 160/63 (!) 160/63  Pulse: (!) 57 (!) 55  Resp: 13 18  Temp: (!) 36.2 C   SpO2: 96% 96%    Last Pain:  Vitals:   10/12/17 0812  TempSrc:   PainSc: 0-No pain         Complications: No apparent anesthesia complications

## 2017-10-12 NOTE — Interval H&P Note (Signed)
History and Physical Interval Note:no change in H and P  10/12/2017 7:10 AM  Justin [name]  has presented today for surgery, with the diagnosis of SYMPTOMATIC GALLSTONES  The various methods of treatment have been discussed with the patient and family. After consideration of risks, benefits and other options for treatment, the patient has consented to  Procedure(s): LAPAROSCOPIC CHOLECYSTECTOMY (N/A) as a surgical intervention .  The patient's history has been reviewed, patient examined, no change in status, stable for surgery.  I have reviewed the patient's chart and labs.  Questions were answered to the patient's satisfaction.     [name] A

## 2017-10-12 NOTE — Op Note (Signed)

## 2017-10-13 ENCOUNTER — Encounter (HOSPITAL_COMMUNITY): Payer: Self-pay | Admitting: Surgery

## 2017-10-29 MED ORDER — RAMIPRIL 10 MG CAP
10 mg | ORAL_CAPSULE | Freq: Every day | ORAL | 0 refills | Status: AC
Start: 2017-10-29 — End: ?

## 2017-11-11 DIAGNOSIS — E1142 Type 2 diabetes mellitus with diabetic polyneuropathy: Secondary | ICD-10-CM | POA: Diagnosis not present

## 2017-11-11 DIAGNOSIS — I5032 Chronic diastolic (congestive) heart failure: Secondary | ICD-10-CM | POA: Diagnosis not present

## 2017-11-11 DIAGNOSIS — I739 Peripheral vascular disease, unspecified: Secondary | ICD-10-CM | POA: Diagnosis not present

## 2017-11-11 DIAGNOSIS — R600 Localized edema: Secondary | ICD-10-CM | POA: Diagnosis not present

## 2017-11-16 ENCOUNTER — Encounter: Payer: Medicare PPO | Admitting: Genetic Counselor

## 2017-11-16 ENCOUNTER — Other Ambulatory Visit: Payer: Medicare PPO

## 2017-11-30 DIAGNOSIS — E1142 Type 2 diabetes mellitus with diabetic polyneuropathy: Secondary | ICD-10-CM | POA: Diagnosis not present

## 2017-11-30 DIAGNOSIS — R0602 Shortness of breath: Secondary | ICD-10-CM | POA: Diagnosis not present

## 2017-11-30 DIAGNOSIS — I739 Peripheral vascular disease, unspecified: Secondary | ICD-10-CM | POA: Diagnosis not present

## 2017-11-30 DIAGNOSIS — I5032 Chronic diastolic (congestive) heart failure: Secondary | ICD-10-CM | POA: Diagnosis not present

## 2017-11-30 NOTE — Progress Notes (Signed)
HEMATOLOGY/ONCOLOGY CONSULTATION NOTE  Date of Service: 12/01/2017  Patient Care Team: Justin Mccreedy, MD as PCP - General (Internal Medicine)  CHIEF COMPLAINTS/PURPOSE OF CONSULTATION:   Monoclonal Paraproteinemia  HISTORY OF PRESENTING ILLNESS:   Justin [name] is a wonderful 84 y.o. male who has been referred to [place] by his nephorlogist for evaluation and management of positive M-spike.   Patient has h/o HTN, DM2 (with retinopathy),CKD, CAD s/p quadruple bypass surgery, HLD, anxiety , h/o Prostate cancer s/p Ablation and presents to the clinic today accompanied by his daughter.   He notes he has comorbidities of CKD that is being managed by his nephrologist Dr Elmarie Shiley MD and notes that this is thought to be related to his HTN and DM2. He notes that he has had DM for 35 years. He is on insulin along with his other DM medication. He notes that he has also had long standing HTN.  As part of his CKD workup with his nephrologist he has SPEP and SFLC were done which showed an M spike of 0.7g/dl with normal SFLC K/L ratio. (M spike was 0.5g/dl in 08/2016)   He has a creatinine of 1.52 with normal calcium levels of 9.5 HGB 13.3 Patient reports no focal bone pains.  No acute new fatigue or change in functional status.  He had his gall bladder removed in 09/2017. He has healed well from that.   On review of symptoms, pt noted chronic back pain. He notes he is more short of breath when going to the bathroom or walking short distance. He notes poor circulation and neuropathy in the legs. He notes they feel swollen but do not appear swollen. He notes ongoing abdominal pain. His daughter notes an unexplained rash seen on his hands, scalp and back of neck. This has been present for over a month and makes him itch. He is not sure what this can be associated with. His newest medication is Lyrica for a few months. He denies food allergy.    MEDICAL HISTORY:  Past Medical History:  Diagnosis  Date  . Anxiety   . Arthritis   . Coronary artery disease   . Diabetes mellitus without complication ([place])   . Diverticulitis   . Dyspnea   . GERD (gastroesophageal reflux disease)   . Hypercholesteremia   . Hypertension   . Pneumonia    as a child  . Prostate cancer ([name])   . UTI (lower urinary tract infection)     SURGICAL HISTORY: Past Surgical History:  Procedure Laterality Date  . ABDOMINAL SURGERY     for diverticulitis; also removed appendix  . CATARACT EXTRACTION, BILATERAL    . CHOLECYSTECTOMY N/A 10/12/2017   Procedure: LAPAROSCOPIC CHOLECYSTECTOMY;  Surgeon: [name], MD;  Location: [facility];  Service: General;  Laterality: N/A;  . CORONARY ARTERY BYPASS GRAFT  2009  . EYE SURGERY     "for bleeding in eye"  . HERNIA REPAIR    . LEFT HEART CATH AND CORONARY ANGIOGRAPHY N/A 11/04/2016   Procedure: Left Heart Cath and Coronary Angiography;  Surgeon: [name], MD;  Location: [facility];  Service: Cardiovascular;  Laterality: N/A;  . LOWER EXTREMITY ANGIOGRAPHY N/A 11/04/2016   Procedure: Lower Extremity Angiography;  Surgeon: [name], MD;  Location: [facility];  Service: Cardiovascular;  Laterality: N/A;  . PROSTATE SURGERY      SOCIAL HISTORY: Social History   Socioeconomic History  . Marital status: Widowed    Spouse name:  Not on file  . Number of children: Not on file  . Years of education: Not on file  . Highest education level: Not on file  Social Needs  . Financial resource strain: Not on file  . Food insecurity - worry: Not on file  . Food insecurity - inability: Not on file  . Transportation needs - medical: Not on file  . Transportation needs - non-medical: Not on file  Occupational History  . Not on file  Tobacco Use  . Smoking status: Former Research scientist (life sciences)  . Smokeless tobacco: Never Used  Substance and Sexual Activity  . Alcohol use: No  . Drug use: No  . Sexual activity: Not on file  Other Topics Concern  . Not on file    Social History Narrative  . Not on file    FAMILY HISTORY: No family history on file.  ALLERGIES:  is allergic to dye fdc red [red dye] and ibuprofen.  MEDICATIONS:  Current Outpatient Medications  Medication Sig Dispense Refill  . acetaminophen (TYLENOL) 500 MG tablet Take 1,000 mg by mouth every 6 (six) hours as needed for moderate pain or headache.    . AMITIZA 24 MCG capsule Take 24 mcg by mouth daily as needed for constipation.     Marland Kitchen aspirin EC 81 MG tablet Take 81 mg by mouth daily.    . chlorthalidone (HYGROTON) 25 MG tablet Take 25 mg by mouth daily.     . Cholecalciferol (VITAMIN D3 PO) Take 1 capsule by mouth daily.    . cilostazol (PLETAL) 100 MG tablet Take 100 mg by mouth 2 (two) times daily.    . clonazePAM (KLONOPIN) 0.5 MG tablet Take 0.5 mg by mouth daily as needed for anxiety.    . Cyanocobalamin (VITAMIN B-12 PO) Take 1 tablet by mouth daily.    . isosorbide mononitrate (IMDUR) 60 MG 24 hr tablet Take 60 mg by mouth daily.    Marland Kitchen JANUVIA 50 MG tablet Take 50 mg by mouth 2 (two) times daily.    Marland Kitchen LANTUS SOLOSTAR 100 UNIT/ML Solostar Pen Inject 30 Units into the skin 2 (two) times daily.     . metoprolol (LOPRESSOR) 50 MG tablet Take 50 mg by mouth 2 (two) times daily.    . nitroGLYCERIN (NITROSTAT) 0.4 MG SL tablet Place 0.4 mg under the tongue every 5 (five) minutes as needed for chest pain.     Marland Kitchen omeprazole (PRILOSEC) 20 MG capsule Take 20 mg by mouth daily.    Marland Kitchen oxyCODONE (OXY IR/ROXICODONE) 5 MG immediate release tablet Take 1-2 tablets (5-10 mg total) by mouth every 6 (six) hours as needed for moderate pain, severe pain or breakthrough pain. 30 tablet 0  . pantoprazole (PROTONIX) 40 MG tablet Take 40 mg by mouth daily.    . pregabalin (LYRICA) 50 MG capsule Take 50 mg by mouth 3 (three) times daily.    . Pyridoxine HCl (VITAMIN B-6 PO) Take 1 tablet by mouth daily.    . ramipril (ALTACE) 10 MG capsule Take 1 capsule (10 mg total) by mouth daily.    . simvastatin  (ZOCOR) 20 MG tablet Take 20 mg by mouth every evening.    . sucralfate (CARAFATE) 1 g tablet Take 1 g by mouth 4 (four) times daily -  with meals and at bedtime.    . tamsulosin (FLOMAX) 0.4 MG CAPS capsule Take 0.4 mg by mouth daily.     No current facility-administered medications for this visit.  REVIEW OF SYSTEMS:    10 Point review of Systems was done is negative except as noted above.  PHYSICAL EXAMINATION: ECOG PERFORMANCE STATUS: 2 - Symptomatic, <50% confined to bed  . Vitals:   12/01/17 0932  BP: (!) 147/59  Pulse: 62  Resp: 20  Temp: 98.5 F (36.9 C)  SpO2: 99%   Filed Weights   12/01/17 0932  Weight: 201 lb (91.2 kg)   .Body mass index is 30.79 kg/m.  GENERAL:alert, in no acute distress and comfortable SKIN: no acute rashes, no significant lesions EYES: conjunctiva are pink and non-injected, sclera anicteric OROPHARYNX: MMM, no exudates, no oropharyngeal erythema or ulceration NECK: supple, no JVD LYMPH:  no palpable lymphadenopathy in the cervical, axillary or inguinal regions LUNGS: clear to auscultation b/l with normal respiratory effort HEART: regular rate & rhythm ABDOMEN:  normoactive bowel sounds , non tender, not distended. Extremity: 1+ pedal edema PSYCH: alert & oriented x 3 with fluent speech NEURO: no focal motor/sensory deficits  LABORATORY DATA:  I have reviewed the data as listed  . CBC Latest Ref Rng & Units 12/01/2017 10/08/2017 03/12/2016  WBC 4.0 - 10.3 K/uL 8.3 8.2 9.3  Hemoglobin 13.0 - 17.0 g/dL - 13.7 11.5(L)  Hematocrit 38.4 - 49.9 % 41.1 43.0 36.5(L)  Platelets 140 - 400 K/uL 155 159 213  HGB 13.2  . CBC    Component Value Date/Time   WBC 8.3 12/01/2017 1040   WBC 8.2 10/08/2017 0842   RBC 4.85 12/01/2017 1040   RBC 4.85 12/01/2017 1040   HGB 13.7 10/08/2017 0842   HCT 41.1 12/01/2017 1040   PLT 155 12/01/2017 1040   MCV 84.7 12/01/2017 1040   MCH 27.2 12/01/2017 1040   MCHC 32.1 12/01/2017 1040   RDW 16.2 (H)  12/01/2017 1040   LYMPHSABS 1.6 12/01/2017 1040   MONOABS 0.8 12/01/2017 1040   EOSABS 0.4 12/01/2017 1040   BASOSABS 0.0 12/01/2017 1040     . CMP Latest Ref Rng & Units 12/01/2017 10/08/2017 03/12/2016  Glucose 70 - 140 mg/dL 235(H) 235(H) 102(H)  BUN 7 - 26 mg/dL 43(H) 26(H) 24(H)  Creatinine 0.70 - 1.30 mg/dL 1.71(H) 1.51(H) 1.14  Sodium 136 - 145 mmol/L 141 136 138  Potassium 3.5 - 5.1 mmol/L 4.7 4.8 4.5  Chloride 98 - 109 mmol/L 113(H) 109 109  CO2 22 - 29 mmol/L 21(L) 22 24  Calcium 8.4 - 10.4 mg/dL 9.5 9.4 9.4  Total Protein 6.4 - 8.3 g/dL 7.1 - -  Total Bilirubin 0.2 - 1.2 mg/dL 0.3 - -  Alkaline Phos 40 - 150 U/L 78 - -  AST 5 - 34 U/L 14 - -  ALT 0 - 55 U/L 16 - -   . Lab Results  Component Value Date   LDH 150 12/01/2017   Component     Latest Ref Rng & Units 12/01/2017  IgG (Immunoglobin G), Serum     700 - 1,600 mg/dL 1,595  IgA     61 - 437 mg/dL 160  IgM (Immunoglobulin M), Srm     15 - 143 mg/dL 14 (L)  Total Protein ELP     6.0 - 8.5 g/dL 6.5  Albumin SerPl Elph-Mcnc     2.9 - 4.4 g/dL 3.2  Alpha 1     0.0 - 0.4 g/dL 0.2  Alpha2 Glob SerPl Elph-Mcnc     0.4 - 1.0 g/dL 0.7  B-Globulin SerPl Elph-Mcnc     0.7 - 1.3 g/dL 0.9  Gamma  Glob SerPl Elph-Mcnc     0.4 - 1.8 g/dL 1.4  M Protein SerPl Elph-Mcnc     Not Observed g/dL 0.5 (H)  Globulin, Total     2.2 - 3.9 g/dL 3.3  Albumin/Glob SerPl     0.7 - 1.7 1.0  IFE 1      Comment  Please Note (HCV):      Comment  Kappa free light chain     3.3 - 19.4 mg/L 29.2 (H)  Lamda free light chains     5.7 - 26.3 mg/L 18.2  Kappa, lamda light chain ratio     0.26 - 1.65 1.60  Sed Rate     0 - 16 mm/hr 16  LDH     125 - 245 U/L 150   IFE 1  Comment VC  Comment: (NOTE)  Immunofixation shows IgG monoclonal protein with kappa light chain  specificity.     OUTSIDE LABS          RADIOGRAPHIC STUDIES:  I have personally reviewed the radiological images as listed and agreed with the findings  in the report. No results found.  ASSESSMENT & PLAN:  Lamichael Youkhana is a 84 y.o. African American male with    1. IgG Kappa Monoclonal Paraproteinemia. M spike today 0.5g/dl (appears to be non progressive since 2017 when it was also 0.5g/dl and 0.7g/dl in 06/2017) Normal Kappa/Lambda ratio  Normal Hg and Ca level, no new focal bone pain  Has CKD - likely related to HTN and DM2 (long standing) Overall low likelihood of Myeloma.  Likely Represents MGUS (Monoclonal gammopathy of undetermined significance. Nl sed rate and LDH PLAN:  -I reviewed the outside labs done by his nephrologist with the pt and his daughter.  -Based on his labs I do not have high suspicion for Multiple Myeloma or AL Amyloidosis -I reviewed the the patient and daughter spectrum of disorders represented by monoclonal paraproteinemia and interpretation of patients labs. -we discussed diagnostic criteria that are monitored. --I recommend starting with blood test and urine sample to get a baseline. I will also get a whole body bone scan.  - I do not recommend a bone marrow biopsy at this time. Further workup of this criteria is up to the pt.  -I discussed with MGUS there is a 1-2%/year risk of transition to multiple myeloma so I will follow him with regular labs after initial labs to characterize his disease.   2. CKD -likely related to HTN and DM2 -Mx by Dr Posey Pronto - nephrology -On Ramipril    3. Diffuse Rash - as seen on back of hands, scalp and back of neck -likely related to Lyrica based on temporal association - will defer to PCP to follow up -I recommend OTC Zyrtec    4. Past Medical History:  Diagnosis Date  . Anxiety   . Arthritis   . Coronary artery disease   . Diabetes mellitus without complication ([employer])   . Diverticulitis   . Dyspnea   . GERD (gastroesophageal reflux disease)   . Hypercholesteremia   . Hypertension   . Pneumonia    as a child  . Prostate cancer ([place])   . UTI (lower urinary  tract infection)   --diabetic neuropathy. -- previously on Gabapentin, currently on Lyrica.  -Mx by PCP   Labs today Bone survey Within 1 week RTC with Dr Irene Limbo in 4 months with labs   All of the patients questions were answered with apparent satisfaction. The patient knows  to call the clinic with any problems, questions or concerns.  I spent 40 minutes counseling the patient face to face. The total time spent in the appointment was 50 minutes and more than 50% was on counseling and direct patient cares.    Sullivan Lone MD [place] AAHIVMS [facility] Hematology/Oncology Physician [facility]  (Office):       [phone] (Work cell):  [phone] (Fax):           [phone]  12/01/2017 10:27 AM  This document serves as a record of services personally performed by Sullivan Lone, MD. It was created on his behalf by Joslyn Devon, a trained medical scribe. The creation of this record is based on the scribe's personal observations and the provider's statements to them.    .I have reviewed the above documentation for accuracy and completeness, and I agree with the above. Brunetta Genera MD MS

## 2017-12-01 ENCOUNTER — Telehealth: Payer: Self-pay

## 2017-12-01 ENCOUNTER — Inpatient Hospital Stay: Payer: Medicare PPO

## 2017-12-01 ENCOUNTER — Inpatient Hospital Stay: Payer: Medicare PPO | Attending: Hematology | Admitting: Hematology

## 2017-12-01 VITALS — BP 147/59 | HR 62 | Temp 98.5°F | Resp 20 | Ht 67.75 in | Wt 201.0 lb

## 2017-12-01 DIAGNOSIS — R21 Rash and other nonspecific skin eruption: Secondary | ICD-10-CM | POA: Diagnosis not present

## 2017-12-01 DIAGNOSIS — D472 Monoclonal gammopathy: Secondary | ICD-10-CM

## 2017-12-01 DIAGNOSIS — I10 Essential (primary) hypertension: Secondary | ICD-10-CM | POA: Insufficient documentation

## 2017-12-01 DIAGNOSIS — E119 Type 2 diabetes mellitus without complications: Secondary | ICD-10-CM | POA: Diagnosis not present

## 2017-12-01 DIAGNOSIS — N189 Chronic kidney disease, unspecified: Secondary | ICD-10-CM | POA: Diagnosis not present

## 2017-12-01 LAB — LACTATE DEHYDROGENASE: LDH: 150 U/L (ref 125–245)

## 2017-12-01 LAB — CMP (CANCER CENTER ONLY)
ALT: 16 U/L (ref 0–55)
AST: 14 U/L (ref 5–34)
Albumin: 3.3 g/dL — ABNORMAL LOW (ref 3.5–5.0)
Alkaline Phosphatase: 78 U/L (ref 40–150)
Anion gap: 7 (ref 3–11)
BUN: 43 mg/dL — ABNORMAL HIGH (ref 7–26)
CO2: 21 mmol/L — ABNORMAL LOW (ref 22–29)
Calcium: 9.5 mg/dL (ref 8.4–10.4)
Chloride: 113 mmol/L — ABNORMAL HIGH (ref 98–109)
Creatinine: 1.71 mg/dL — ABNORMAL HIGH (ref 0.70–1.30)
GFR, Est AFR Am: 41 mL/min — ABNORMAL LOW (ref >60)
GFR, Estimated: 36 mL/min — ABNORMAL LOW (ref >60)
Glucose, Bld: 235 mg/dL — ABNORMAL HIGH (ref 70–140)
Potassium: 4.7 mmol/L (ref 3.5–5.1)
Sodium: 141 mmol/L (ref 136–145)
Total Bilirubin: 0.3 mg/dL (ref 0.2–1.2)
Total Protein: 7.1 g/dL (ref 6.4–8.3)

## 2017-12-01 LAB — CBC WITH DIFFERENTIAL (CANCER CENTER ONLY)
Basophils Absolute: 0 10*3/uL (ref 0.0–0.1)
Basophils Relative: 1 %
Eosinophils Absolute: 0.4 10*3/uL (ref 0.0–0.5)
Eosinophils Relative: 4 %
HCT: 41.1 % (ref 38.4–49.9)
Hemoglobin: 13.2 g/dL (ref 13.0–17.1)
Lymphocytes Relative: 19 %
Lymphs Abs: 1.6 10*3/uL (ref 0.9–3.3)
MCH: 27.2 pg (ref 27.2–33.4)
MCHC: 32.1 g/dL (ref 32.0–36.0)
MCV: 84.7 fL (ref 79.3–98.0)
Monocytes Absolute: 0.8 10*3/uL (ref 0.1–0.9)
Monocytes Relative: 9 %
Neutro Abs: 5.5 10*3/uL (ref 1.5–6.5)
Neutrophils Relative %: 67 %
Platelet Count: 155 10*3/uL (ref 140–400)
RBC: 4.85 MIL/uL (ref 4.20–5.82)
RDW: 16.2 % — ABNORMAL HIGH (ref 11.0–14.6)
WBC Count: 8.3 10*3/uL (ref 4.0–10.3)

## 2017-12-01 LAB — RETICULOCYTES
RBC.: 4.85 MIL/uL (ref 4.20–5.82)
Retic Count, Absolute: 72.8 10*3/uL (ref 34.8–93.9)
Retic Ct Pct: 1.5 % (ref 0.8–1.8)

## 2017-12-01 LAB — SEDIMENTATION RATE: Sed Rate: 16 mm/hr (ref 0–16)

## 2017-12-01 NOTE — Telephone Encounter (Signed)
Printed avs and calender of upcoming appointment.  Per 3/5 los 

## 2017-12-01 NOTE — Patient Instructions (Signed)
Thank you for choosing [facility] to provide your oncology and hematology care.  To afford each patient quality time with our providers, please arrive 30 minutes before your scheduled appointment time.  If you arrive late for your appointment, you may be asked to reschedule.  We strive to give you quality time with our providers, and arriving late affects you and other patients whose appointments are after yours.   If you are a no show for multiple scheduled visits, you may be dismissed from the clinic at the providers discretion.    Again, thank you for choosing [facility], our hope is that these requests will decrease the amount of time that you wait before being seen by our physicians.  ______________________________________________________________________  Should you have questions after your visit to the [facility], please contact our office at [phone] between the hours of 8:30 and 4:30 p.m.    Voicemails left after 4:30p.m will not be returned until the following business day.    For prescription refill requests, please have your pharmacy contact us directly.  Please also try to allow 48 hours for prescription requests.    Please contact the scheduling department for questions regarding scheduling.  For scheduling of procedures such as PET scans, CT scans, MRI, Ultrasound, etc please contact central scheduling at [phone].    Resources For Cancer Patients and Caregivers:   Oncolink.org:  A wonderful resource for patients and healthcare providers for information regarding your disease, ways to tract your treatment, what to expect, etc.     American Cancer Society:  800-227-2345  Can help patients locate various types of support and financial assistance  Cancer Care: 1-800-813-HOPE (4673) Provides financial assistance, online support groups, medication/co-pay assistance.    [place] DSS:  336-641-3447 Where to apply for food  stamps, Medicaid, and utility assistance  Medicare Rights Center: 800-333-4114 Helps people with Medicare understand their rights and benefits, navigate the Medicare system, and secure the quality healthcare they deserve  SCAT: 336-333-6589 [place] [employer]'s shared-ride transportation service for eligible riders who have a disability that prevents them from riding the fixed route bus.    For additional information on assistance programs please contact our social worker:   Grier Hock/Abigail Elmore:  [phone]            

## 2017-12-02 LAB — KAPPA/LAMBDA LIGHT CHAINS
Kappa free light chain: 29.2 mg/L — ABNORMAL HIGH (ref 3.3–19.4)
Kappa, lambda light chain ratio: 1.6 (ref 0.26–1.65)
Lambda free light chains: 18.2 mg/L (ref 5.7–26.3)

## 2017-12-03 ENCOUNTER — Telehealth (HOSPITAL_COMMUNITY): Payer: Self-pay

## 2017-12-03 LAB — MULTIPLE MYELOMA PANEL, SERUM
Albumin SerPl Elph-Mcnc: 3.2 g/dL (ref 2.9–4.4)
Albumin/Glob SerPl: 1 (ref 0.7–1.7)
Alpha 1: 0.2 g/dL (ref 0.0–0.4)
Alpha2 Glob SerPl Elph-Mcnc: 0.7 g/dL (ref 0.4–1.0)
B-Globulin SerPl Elph-Mcnc: 0.9 g/dL (ref 0.7–1.3)
Gamma Glob SerPl Elph-Mcnc: 1.4 g/dL (ref 0.4–1.8)
Globulin, Total: 3.3 g/dL (ref 2.2–3.9)
IgA: 160 mg/dL (ref 61–437)
IgG (Immunoglobin G), Serum: 1595 mg/dL (ref 700–1600)
IgM (Immunoglobulin M), Srm: 14 mg/dL — ABNORMAL LOW (ref 15–143)
M Protein SerPl Elph-Mcnc: 0.5 g/dL — ABNORMAL HIGH
Total Protein ELP: 6.5 g/dL (ref 6.0–8.5)

## 2017-12-03 NOTE — Telephone Encounter (Signed)
Patients insurance is active and benefits verified through [facility] - $15.00 co-pay, no deductible, out of pocket amount of $6,700/$540.50 has been met, no co-insurance, and no pre-authorization is required. Spoke with [facility] - Reference [phone]

## 2017-12-03 NOTE — Telephone Encounter (Signed)
Called to speak with patient in regards to Pulmonary rehab - patient is interested in the program. He would like to attend the 10:30am exc class and right now that class is full. I explained to patient that once a spot becomes open I will give him a call to schedule for orientation. Patient stated he understands. Went over insurance with patient and patient verbally stated he understands what he is responsible to pay.

## 2017-12-08 ENCOUNTER — Ambulatory Visit (HOSPITAL_COMMUNITY)
Admission: RE | Admit: 2017-12-08 | Discharge: 2017-12-08 | Disposition: A | Discharge status: Home / Self Care | Payer: Medicare PPO | Source: Ambulatory Visit | Attending: Hematology | Admitting: Hematology

## 2017-12-08 ENCOUNTER — Inpatient Hospital Stay: Payer: Medicare PPO

## 2017-12-08 DIAGNOSIS — R21 Rash and other nonspecific skin eruption: Secondary | ICD-10-CM | POA: Diagnosis not present

## 2017-12-08 DIAGNOSIS — M4328 Fusion of spine, sacral and sacrococcygeal region: Secondary | ICD-10-CM | POA: Insufficient documentation

## 2017-12-08 DIAGNOSIS — D472 Monoclonal gammopathy: Secondary | ICD-10-CM

## 2017-12-08 DIAGNOSIS — M479 Spondylosis, unspecified: Secondary | ICD-10-CM | POA: Insufficient documentation

## 2017-12-08 DIAGNOSIS — N189 Chronic kidney disease, unspecified: Secondary | ICD-10-CM | POA: Diagnosis not present

## 2017-12-08 DIAGNOSIS — I10 Essential (primary) hypertension: Secondary | ICD-10-CM | POA: Diagnosis not present

## 2017-12-08 DIAGNOSIS — E119 Type 2 diabetes mellitus without complications: Secondary | ICD-10-CM | POA: Diagnosis not present

## 2017-12-09 LAB — UPEP/TP, 24-HR URINE
Albumin, U: 55.9 %
Alpha 1, Urine: 7.9 %
Alpha 2, Urine: 6.6 %
Beta, Urine: 16.6 %
Gamma Globulin, Urine: 13 %
Total Protein, Urine-Ur/day: 234 mg/24 hr — ABNORMAL HIGH (ref 30–150)
Total Protein, Urine: 15.1 mg/dL

## 2017-12-10 ENCOUNTER — Telehealth (HOSPITAL_COMMUNITY): Payer: Self-pay

## 2017-12-10 DIAGNOSIS — Z125 Encounter for screening for malignant neoplasm of prostate: Secondary | ICD-10-CM | POA: Diagnosis not present

## 2017-12-10 DIAGNOSIS — I10 Essential (primary) hypertension: Secondary | ICD-10-CM | POA: Diagnosis not present

## 2017-12-10 DIAGNOSIS — I2510 Atherosclerotic heart disease of native coronary artery without angina pectoris: Secondary | ICD-10-CM | POA: Diagnosis not present

## 2017-12-10 DIAGNOSIS — R1013 Epigastric pain: Secondary | ICD-10-CM | POA: Diagnosis not present

## 2017-12-10 DIAGNOSIS — E118 Type 2 diabetes mellitus with unspecified complications: Secondary | ICD-10-CM | POA: Diagnosis not present

## 2017-12-10 DIAGNOSIS — Z0000 Encounter for general adult medical examination without abnormal findings: Secondary | ICD-10-CM | POA: Diagnosis not present

## 2017-12-10 DIAGNOSIS — D509 Iron deficiency anemia, unspecified: Secondary | ICD-10-CM | POA: Diagnosis not present

## 2017-12-10 DIAGNOSIS — N400 Enlarged prostate without lower urinary tract symptoms: Secondary | ICD-10-CM | POA: Diagnosis not present

## 2017-12-10 DIAGNOSIS — E785 Hyperlipidemia, unspecified: Secondary | ICD-10-CM | POA: Diagnosis not present

## 2017-12-10 DIAGNOSIS — Z5181 Encounter for therapeutic drug level monitoring: Secondary | ICD-10-CM | POA: Diagnosis not present

## 2017-12-10 NOTE — Telephone Encounter (Signed)
Called to schedule patient for Pulmonary Rehab - Scheduled orientation 01/29/2018 at 9:30am. Patient will attend the 10:30am exc class. Mailed packet.

## 2017-12-23 DIAGNOSIS — R05 Cough: Secondary | ICD-10-CM | POA: Diagnosis not present

## 2017-12-23 DIAGNOSIS — R1310 Dysphagia, unspecified: Secondary | ICD-10-CM | POA: Diagnosis not present

## 2017-12-23 DIAGNOSIS — K219 Gastro-esophageal reflux disease without esophagitis: Secondary | ICD-10-CM | POA: Diagnosis not present

## 2017-12-24 DIAGNOSIS — I25119 Atherosclerotic heart disease of native coronary artery with unspecified angina pectoris: Secondary | ICD-10-CM | POA: Diagnosis not present

## 2017-12-24 DIAGNOSIS — R0602 Shortness of breath: Secondary | ICD-10-CM | POA: Diagnosis not present

## 2017-12-29 DIAGNOSIS — K222 Esophageal obstruction: Secondary | ICD-10-CM | POA: Diagnosis not present

## 2017-12-29 DIAGNOSIS — K317 Polyp of stomach and duodenum: Secondary | ICD-10-CM | POA: Diagnosis not present

## 2017-12-29 DIAGNOSIS — D131 Benign neoplasm of stomach: Secondary | ICD-10-CM | POA: Diagnosis not present

## 2017-12-29 DIAGNOSIS — K449 Diaphragmatic hernia without obstruction or gangrene: Secondary | ICD-10-CM | POA: Diagnosis not present

## 2017-12-31 DIAGNOSIS — N400 Enlarged prostate without lower urinary tract symptoms: Secondary | ICD-10-CM | POA: Diagnosis not present

## 2017-12-31 DIAGNOSIS — R1013 Epigastric pain: Secondary | ICD-10-CM | POA: Diagnosis not present

## 2017-12-31 DIAGNOSIS — E785 Hyperlipidemia, unspecified: Secondary | ICD-10-CM | POA: Diagnosis not present

## 2017-12-31 DIAGNOSIS — F419 Anxiety disorder, unspecified: Secondary | ICD-10-CM | POA: Diagnosis not present

## 2017-12-31 DIAGNOSIS — Z5181 Encounter for therapeutic drug level monitoring: Secondary | ICD-10-CM | POA: Diagnosis not present

## 2017-12-31 DIAGNOSIS — D509 Iron deficiency anemia, unspecified: Secondary | ICD-10-CM | POA: Diagnosis not present

## 2017-12-31 DIAGNOSIS — I10 Essential (primary) hypertension: Secondary | ICD-10-CM | POA: Diagnosis not present

## 2017-12-31 DIAGNOSIS — I2510 Atherosclerotic heart disease of native coronary artery without angina pectoris: Secondary | ICD-10-CM | POA: Diagnosis not present

## 2017-12-31 DIAGNOSIS — E118 Type 2 diabetes mellitus with unspecified complications: Secondary | ICD-10-CM | POA: Diagnosis not present

## 2018-01-22 ENCOUNTER — Telehealth (HOSPITAL_COMMUNITY): Payer: Self-pay

## 2018-01-22 NOTE — Telephone Encounter (Signed)
Called patient in regards to Pulmonary Rehab Orientation appointment on 01/29/18. Patient confirmed.  

## 2018-01-27 ENCOUNTER — Encounter (HOSPITAL_COMMUNITY): Payer: Self-pay | Admitting: Emergency Medicine

## 2018-01-27 ENCOUNTER — Ambulatory Visit (HOSPITAL_COMMUNITY)
Admission: EM | Admit: 2018-01-27 | Discharge: 2018-01-27 | Disposition: A | Discharge status: Home / Self Care | Payer: Medicare PPO | Attending: Urgent Care | Admitting: Urgent Care

## 2018-01-27 ENCOUNTER — Ambulatory Visit (INDEPENDENT_AMBULATORY_CARE_PROVIDER_SITE_OTHER): Payer: Medicare PPO

## 2018-01-27 DIAGNOSIS — M5136 Other intervertebral disc degeneration, lumbar region: Secondary | ICD-10-CM

## 2018-01-27 DIAGNOSIS — M5441 Lumbago with sciatica, right side: Secondary | ICD-10-CM

## 2018-01-27 DIAGNOSIS — S3992XA Unspecified injury of lower back, initial encounter: Secondary | ICD-10-CM | POA: Diagnosis not present

## 2018-01-27 DIAGNOSIS — Z8546 Personal history of malignant neoplasm of prostate: Secondary | ICD-10-CM

## 2018-01-27 DIAGNOSIS — M25551 Pain in right hip: Secondary | ICD-10-CM

## 2018-01-27 DIAGNOSIS — M545 Low back pain: Secondary | ICD-10-CM | POA: Diagnosis not present

## 2018-01-27 DIAGNOSIS — M5410 Radiculopathy, site unspecified: Secondary | ICD-10-CM

## 2018-01-27 DIAGNOSIS — M160 Bilateral primary osteoarthritis of hip: Secondary | ICD-10-CM | POA: Diagnosis not present

## 2018-01-27 MED ORDER — PREDNISONE 20 MG PO TABS: ORAL_TABLET | ORAL | 0 refills | Status: DC

## 2018-01-27 NOTE — Discharge Instructions (Addendum)
Please make sure you get in touch with an orthopedist for management of your arthritis. If your blood sugar rises above 300's, then stop prednisone.

## 2018-01-27 NOTE — ED Provider Notes (Signed)
MRN: [id number] DOB: 04/01/1933  Subjective:   Justin [name] is a 84 y.o. male presenting for 2-week history of persistent progressively worsening right-sided low back/hip pain that radiates down into his right leg.  Patient denies a history of arthritis in his low back or hip.  He states that his pain is pretty debilitating, sharp in nature and causes his entire right leg to give out.  He has been taking Tylenol without any relief.  He has a history of 2 bouts with prostate cancer, last episode was a little over a year ago.  To the best of his knowledge patient states he is in remission, did not undergo chemotherapy or radiation therapy.  Denies fever, weight loss, nausea, vomiting, hematuria, dysuria, difficulty urinating, numbness or tingling, weakness, constipation.  He has not had follow-up with his urologist, oncologist.  No current facility-administered medications for this encounter.   Current Outpatient Medications:  .  acetaminophen (TYLENOL) 500 MG tablet, Take 1,000 mg by mouth every 6 (six) hours as needed for moderate pain or headache., Disp: , Rfl:  .  AMITIZA 24 MCG capsule, Take 24 mcg by mouth daily as needed for constipation. , Disp: , Rfl:  .  aspirin EC 81 MG tablet, Take 81 mg by mouth daily., Disp: , Rfl:  .  chlorthalidone (HYGROTON) 25 MG tablet, Take 25 mg by mouth daily. , Disp: , Rfl:  .  Cholecalciferol (VITAMIN D3 PO), Take 1 capsule by mouth daily., Disp: , Rfl:  .  cilostazol (PLETAL) 100 MG tablet, Take 100 mg by mouth 2 (two) times daily., Disp: , Rfl:  .  clonazePAM (KLONOPIN) 0.5 MG tablet, Take 0.5 mg by mouth daily as needed for anxiety., Disp: , Rfl:  .  Cyanocobalamin (VITAMIN B-12 PO), Take 1 tablet by mouth daily., Disp: , Rfl:  .  isosorbide mononitrate (IMDUR) 60 MG 24 hr tablet, Take 60 mg by mouth daily., Disp: , Rfl:  .  JANUVIA 50 MG tablet, Take 50 mg by mouth 2 (two) times daily., Disp: , Rfl:  .  LANTUS SOLOSTAR 100 UNIT/ML Solostar Pen, Inject 30  Units into the skin 2 (two) times daily. , Disp: , Rfl:  .  metoprolol (LOPRESSOR) 50 MG tablet, Take 50 mg by mouth 2 (two) times daily., Disp: , Rfl:  .  nitroGLYCERIN (NITROSTAT) 0.4 MG SL tablet, Place 0.4 mg under the tongue every 5 (five) minutes as needed for chest pain. , Disp: , Rfl:  .  omeprazole (PRILOSEC) 20 MG capsule, Take 20 mg by mouth daily., Disp: , Rfl:  .  pantoprazole (PROTONIX) 40 MG tablet, Take 40 mg by mouth daily., Disp: , Rfl:  .  pregabalin (LYRICA) 50 MG capsule, Take 50 mg by mouth 3 (three) times daily., Disp: , Rfl:  .  Pyridoxine HCl (VITAMIN B-6 PO), Take 1 tablet by mouth daily., Disp: , Rfl:  .  ramipril (ALTACE) 10 MG capsule, Take 1 capsule (10 mg total) by mouth daily., Disp: , Rfl:  .  simvastatin (ZOCOR) 20 MG tablet, Take 20 mg by mouth every evening., Disp: , Rfl:  .  sucralfate (CARAFATE) 1 g tablet, Take 1 g by mouth 4 (four) times daily -  with meals and at bedtime., Disp: , Rfl:  .  tamsulosin (FLOMAX) 0.4 MG CAPS capsule, Take 0.4 mg by mouth daily., Disp: , Rfl:    Allergies  Allergen Reactions  . Dye Fdc Red [Red Dye] Swelling and Other (See Comments)    CAT  scan dye  . Ibuprofen Other (See Comments)    Upset stomach     Past Medical History:  Diagnosis Date  . Anxiety   . Arthritis   . Coronary artery disease   . Diabetes mellitus without complication ([place])   . Diverticulitis   . Dyspnea   . GERD (gastroesophageal reflux disease)   . Hypercholesteremia   . Hypertension   . Pneumonia    as a child  . Prostate cancer ([place])   . UTI (lower urinary tract infection)      Past Surgical History:  Procedure Laterality Date  . ABDOMINAL SURGERY     for diverticulitis; also removed appendix  . CATARACT EXTRACTION, BILATERAL    . CHOLECYSTECTOMY N/A 10/12/2017   Procedure: LAPAROSCOPIC CHOLECYSTECTOMY;  Surgeon: [name], MD;  Location: [facility];  Service: General;  Laterality: N/A;  . CORONARY ARTERY BYPASS GRAFT  2009  . EYE  SURGERY     "for bleeding in eye"  . HERNIA REPAIR    . LEFT HEART CATH AND CORONARY ANGIOGRAPHY N/A 11/04/2016   Procedure: Left Heart Cath and Coronary Angiography;  Surgeon: [name], MD;  Location: [facility];  Service: Cardiovascular;  Laterality: N/A;  . LOWER EXTREMITY ANGIOGRAPHY N/A 11/04/2016   Procedure: Lower Extremity Angiography;  Surgeon: [name], MD;  Location: [facility];  Service: Cardiovascular;  Laterality: N/A;  . PROSTATE SURGERY      Objective:   Vitals: BP 127/78 (BP Location: Right Arm)   Pulse 61   Temp 97.9 F (36.6 C) (Oral)   Resp 18   SpO2 100%   Physical Exam  Constitutional: He is oriented to person, place, and time. He appears well-developed and well-nourished.  Cardiovascular: Normal rate.  Pulmonary/Chest: Effort normal.  Musculoskeletal:       Right hip: He exhibits decreased range of motion (flexion). He exhibits normal strength, no tenderness, no bony tenderness, no swelling, no crepitus and no deformity.       Lumbar back: He exhibits decreased range of motion (flexion, extension) and tenderness (over area). He exhibits no bony tenderness, no swelling, no edema, no deformity and no spasm.       Back:  Neurological: He is alert and oriented to person, place, and time.   Dg Lumbar Spine Complete  Result Date: 01/27/2018 CLINICAL DATA:  Pain following fall EXAM: LUMBAR SPINE - COMPLETE 4+ VIEW COMPARISON:  December 08, 2017 FINDINGS: Frontal, lateral, spot lumbosacral lateral, and bilateral oblique views were obtained. There are 5 non-rib-bearing lumbar type vertebral bodies. There is no evident fracture or spondylolisthesis. The disc spaces appear unremarkable. There are prominent anterior osteophytes at all levels. There is facet osteoarthritic change at L4-5 and L5-S1 bilaterally. There is aortic atherosclerosis. IMPRESSION: Areas of osteoarthritic change. No fracture or spondylolisthesis. There is aortic atherosclerosis. Aortic  Atherosclerosis (ICD10-I70.0). Electronically Signed   By: Lowella Grip III M.D.   On: 01/27/2018 11:59   Dg Pelvis 1-2 Views  Result Date: 01/27/2018 CLINICAL DATA:  Right-sided hip pain EXAM: PELVIS - 1-2 VIEW COMPARISON:  12/08/2017 FINDINGS: Pelvic ring is intact. Diffuse vascular calcifications are seen. Degenerative changes of the hip joints are noted bilaterally. No acute fracture or dislocation is noted. No soft tissue abnormality is seen. IMPRESSION: Degenerative change without acute abnormality. Electronically Signed   By: Inez Catalina M.D.   On: 01/27/2018 11:46   Assessment and Plan :   Acute right-sided low back pain with right-sided sciatica  Right  hip pain  Radicular pain  History of prostate cancer  Will start short steroid course for his arthritic type pain and symptoms of sciatica. Recommended follow up with his PCP given his history of prostate cancer. Also recommended patient try to establish with an orthopedist for further management. Counseled patient on potential for adverse effects with medications prescribed today, patient verbalized understanding. Return-to-clinic precautions discussed, patient verbalized understanding.    Jaynee Eagles, PA-C 01/27/18 1215

## 2018-01-27 NOTE — ED Triage Notes (Signed)
Pt here for lower back and right hip pain chronic in nature

## 2018-01-29 ENCOUNTER — Encounter (HOSPITAL_COMMUNITY)
Admission: RE | Admit: 2018-01-29 | Discharge: 2018-01-29 | Disposition: A | Discharge status: Home / Self Care | Payer: Medicare PPO | Source: Ambulatory Visit | Attending: Cardiology | Admitting: Cardiology

## 2018-01-29 ENCOUNTER — Encounter (HOSPITAL_COMMUNITY): Payer: Self-pay

## 2018-01-29 VITALS — BP 156/60 | HR 67 | Resp 18 | Ht 66.25 in | Wt 196.9 lb

## 2018-01-29 DIAGNOSIS — I5032 Chronic diastolic (congestive) heart failure: Secondary | ICD-10-CM | POA: Insufficient documentation

## 2018-01-29 DIAGNOSIS — I509 Heart failure, unspecified: Secondary | ICD-10-CM

## 2018-01-29 NOTE — Progress Notes (Signed)
Justin [name] 84 y.o. male Pulmonary Rehab Orientation Note Patient arrived today in Cardiac and Pulmonary Rehab for orientation to Pulmonary Rehab. He was transported from [employer] via wheel chair by his daughter. He does not carry portable oxygen nor has been prescribed oxygen for home use. Color good, skin warm and dry. Patient is oriented to time and place. Patient's medical history, psychosocial health, and medications reviewed. Psychosocial assessment reveals pt lives alone. He is living in an apartment above the his stepsons garage. He moved to [place] 2 years ago to be closer to his children. Pt is currently retired. Pt hobbies include going to movies. Pt states he does not have any current stressors in his life.  Pt does not exhibit signs of depression. He does not sleep as well as he wishes but he relates that to his acid reflux and chronic dry cough. His MD is aware and measure are being taken to manage both. PHQ2/9 score 0/na. Pt shows good  coping skills with positive outlook. Pt was offered emotional support and reassurance. Will continue to monitor and evaluate progress toward psychosocial goal(s) of maintaining a positive attitude about his ability to participate in the program and overcome the physical orthopedic barriers that limit his activity. Physical assessment reveals heart rate is normal, breath sounds clear to auscultation, no wheezes, rales, or rhonchi. Grip strength equal, strong. Distal pulses palpable. No edema noted. Patient reports he does take medications as prescribed. Patient states he follows a Diabetic diet. The patient reports no specific efforts to gain or lose weight. Patient's weight will be monitored closely. Demonstration and practice of PLB using pulse oximeter. Patient able to return demonstration satisfactorily. Safety and hand hygiene in the exercise area reviewed with patient. Patient voices understanding of the information reviewed. Department expectations discussed  with patient and achievable goals were set. The patient shows enthusiasm about attending the program and we look forward to working with this nice gentleman. The patient is not scheduled for a walk test or exercise sessions at this time. During his orientation it was discovered that the patient has been referred to out patient PT however he declined because he felt pulmonary rehab and PT were one in the same. The patient is having 10/10 right hip pain that has caused recent fall with numbness and tingling in leg. He presented to ED on Tuesday of this week with pain and was given a steroid dose pack. RN located referral and left VM message on referral line at Orthopedic rehab on [address]. Patient instructed that he would be placed on medical hold from pulmonary rehab until he could complete PT related to significance of pain and patients inability to ambulate or exercise without pain. Patient and daughter verbalized understanding.    45 minutes was spent on a variety of activities such as assessment of the patient, obtaining baseline data including height, weight, BMI, and grip strength, verifying medical history, allergies, and current medications, and teaching patient strategies for performing tasks with less respiratory effort with emphasis on pursed lip breathing.

## 2018-02-10 DIAGNOSIS — F419 Anxiety disorder, unspecified: Secondary | ICD-10-CM | POA: Diagnosis not present

## 2018-02-10 DIAGNOSIS — Z5181 Encounter for therapeutic drug level monitoring: Secondary | ICD-10-CM | POA: Diagnosis not present

## 2018-02-10 DIAGNOSIS — E118 Type 2 diabetes mellitus with unspecified complications: Secondary | ICD-10-CM | POA: Diagnosis not present

## 2018-02-10 DIAGNOSIS — R1013 Epigastric pain: Secondary | ICD-10-CM | POA: Diagnosis not present

## 2018-02-10 DIAGNOSIS — N400 Enlarged prostate without lower urinary tract symptoms: Secondary | ICD-10-CM | POA: Diagnosis not present

## 2018-02-10 DIAGNOSIS — D509 Iron deficiency anemia, unspecified: Secondary | ICD-10-CM | POA: Diagnosis not present

## 2018-02-10 DIAGNOSIS — E785 Hyperlipidemia, unspecified: Secondary | ICD-10-CM | POA: Diagnosis not present

## 2018-02-10 DIAGNOSIS — I2510 Atherosclerotic heart disease of native coronary artery without angina pectoris: Secondary | ICD-10-CM | POA: Diagnosis not present

## 2018-02-10 DIAGNOSIS — I10 Essential (primary) hypertension: Secondary | ICD-10-CM | POA: Diagnosis not present

## 2018-02-11 NOTE — Telephone Encounter (Signed)
Pt under another provider care

## 2018-02-16 ENCOUNTER — Ambulatory Visit: Payer: Medicare PPO | Attending: Internal Medicine | Admitting: Physical Therapy

## 2018-02-16 ENCOUNTER — Encounter: Payer: Self-pay | Admitting: Physical Therapy

## 2018-02-16 ENCOUNTER — Other Ambulatory Visit: Payer: Self-pay

## 2018-02-16 DIAGNOSIS — G8929 Other chronic pain: Secondary | ICD-10-CM

## 2018-02-16 DIAGNOSIS — R2689 Other abnormalities of gait and mobility: Secondary | ICD-10-CM | POA: Diagnosis not present

## 2018-02-16 DIAGNOSIS — M545 Low back pain: Secondary | ICD-10-CM | POA: Insufficient documentation

## 2018-02-16 DIAGNOSIS — M6281 Muscle weakness (generalized): Secondary | ICD-10-CM | POA: Insufficient documentation

## 2018-02-16 DIAGNOSIS — M25551 Pain in right hip: Secondary | ICD-10-CM | POA: Insufficient documentation

## 2018-02-16 NOTE — Therapy (Signed)
[place], [place], [address] Phone: [phone]   Fax:  [phone]  Physical Therapy Evaluation  Patient Details  Name: Justin [name] MRN: [id number] Date of Birth: 1933-02-25 Referring Provider: [name], MD   Encounter Date: 02/16/2018  PT End of Session - 02/16/18 0938    Visit Number  1    Number of Visits  13    Date for PT Re-Evaluation  03/30/18    Authorization Type  MCR: KX mod by 15th, PN by 10th    PT Start Time  0845    PT Stop Time  0931    PT Time Calculation (min)  46 min    Activity Tolerance  Patient tolerated treatment well    Behavior During Therapy  [facility] for tasks assessed/performed       Past Medical History:  Diagnosis Date  . Anxiety   . Arthritis   . Coronary artery disease   . Diabetes mellitus without complication ([place])   . Diverticulitis   . Dyspnea   . GERD (gastroesophageal reflux disease)   . Hypercholesteremia   . Hypertension   . Pneumonia    as a child  . Prostate cancer ([place])   . UTI (lower urinary tract infection)     Past Surgical History:  Procedure Laterality Date  . ABDOMINAL SURGERY     for diverticulitis; also removed appendix  . CATARACT EXTRACTION, BILATERAL    . CHOLECYSTECTOMY N/A 10/12/2017   Procedure: LAPAROSCOPIC CHOLECYSTECTOMY;  Surgeon: [name], MD;  Location: [facility];  Service: General;  Laterality: N/A;  . CORONARY ARTERY BYPASS GRAFT  2009  . EYE SURGERY     "for bleeding in eye"  . HERNIA REPAIR    . LEFT HEART CATH AND CORONARY ANGIOGRAPHY N/A 11/04/2016   Procedure: Left Heart Cath and Coronary Angiography;  Surgeon: [name], MD;  Location: [facility];  Service: Cardiovascular;  Laterality: N/A;  . LOWER EXTREMITY ANGIOGRAPHY N/A 11/04/2016   Procedure: Lower Extremity Angiography;  Surgeon: [name], MD;  Location: [facility];  Service: Cardiovascular;  Laterality: N/A;  . PROSTATE SURGERY       There were no vitals filed for this visit.   Subjective Assessment - 02/16/18 0855    Subjective  pt is a 84 y.o M with CC of chronic low back/ R posterior hip for over 30 years reporting one morning in April he was having increase pain and had difficulty staning up straight and couldn't move the R leg. reports the R leg felt like it wanted to give away. He went to Urgent care and was given medication which he reports has helped significantly. Denies pain down the leg aside from hx of peripheral neuropathy.     Pertinent History  hx or prostate cancer    Limitations  Walking;Lifting    How long can you sit comfortably?  30 min    How long can you stand comfortably?  15 min    How long can you walk comfortably?  15 min     Diagnostic tests  x-ray     Patient Stated Goals  to get rid of all the pain    Currently in Pain?  Yes    Pain Score  6  at worst 10/10, last took medication this Am    Pain Location  Hip    Pain Orientation  Right;Posterior    Pain Descriptors / Indicators  Sharp;Aching;Sore  Pain Type  Chronic pain    Pain Onset  More than a month ago    Pain Frequency  Intermittent    Aggravating Factors   prolonged walking/ standing, sleeping, sitting,     Pain Relieving Factors  medication,     Effect of Pain on Daily Activities  limited endurance,          [facility] PT Assessment - 02/16/18 0853      Assessment   Medical Diagnosis  Low back and hip pain    Referring Provider  [name], MD    Onset Date/Surgical Date  -- 30 years    Hand Dominance  Right    Next MD Visit  -- in a week     Prior Therapy  yes 2 x in the past for low back       Precautions   Precautions  None      Restrictions   Weight Bearing Restrictions  No      Balance Screen   Has the patient fallen in the past 6 months  No    Has the patient had a decrease in activity level because of a fear of falling?   No    Is the patient reluctant to leave their home because of a fear of  falling?   No      Home Environment   Living Environment  Private residence    Living Arrangements  Alone    Type of [place] to enter    Entrance Stairs-Number of Steps  12    Entrance Stairs-Rails  Cannot reach both    [place]  One level    [place] - single point      Prior Function   Level of [place]  Retired      Associate Professor   Overall Cognitive Status  Within Functional Limits for tasks assessed      Observation/Other Assessments   Focus on Therapeutic Outcomes (FOTO)   48% limited predicted 32% limited      Posture/Postural Control   Posture/Postural Control  Postural limitations    Postural Limitations  Rounded Shoulders;Forward head      ROM / Strength   AROM / PROM / Strength  AROM;Strength      AROM   AROM Assessment Site  Hip;Knee;Lumbar    Right/Left Hip  Right;Left    Right Hip Extension  -3    Right Hip Flexion  85    Left Hip Extension  -4    Left Hip Flexion  90    Right/Left Knee  Right;Left    Lumbar Flexion  50    Lumbar Extension  18    Lumbar - Right Side Bend  13    Lumbar - Left Side Bend  10 audible cavitation noted during movement with R pain      Strength   Strength Assessment Site  Hip;Knee    Right/Left Hip  Right;Left    Right Hip Flexion  3+/5    Right Hip ABduction  3+/5 pain during testing    Right Hip ADduction  4-/5    Left Hip Flexion  4-/5    Left Hip ABduction  3+/5    Left Hip ADduction  4-/5    Right/Left Knee  Right;Left    Right Knee Flexion  5/5    Right Knee Extension  5/5    Left  Knee Flexion  5/5    Left Knee Extension  5/5      Palpation   Spinal mobility  L1-L5 hypomobility with PAIVM    Palpation comment  TTP along R lumbar paraspinals and Glute medius with mulitple trigger points noted      Special Tests    Special Tests  Lumbar    Lumbar Tests  Prone Knee Bend Test;Straight Leg Raise      Prone Knee Bend Test   Findings  Positive       Straight Leg Raise   Findings  Negative      Ambulation/Gait   Ambulation/Gait  Yes    Assistive device  Straight cane    Gait Pattern  Step-through pattern;Decreased stride length;Antalgic;Trendelenburg;Trunk flexed;Narrow base of support;Decreased trunk rotation                Objective measurements completed on examination: See above findings.      [place] Adult PT Treatment/Exercise - 02/16/18 0853      Lumbar Exercises: Stretches   Lower Trunk Rotation Limitations  2 x 10 holding end range x 2 sec      Lumbar Exercises: Supine   Clam  10 reps with red theraband    Bridge  10 reps      Knee/Hip Exercises: Stretches   Hip Flexor Stretch  2 reps;30 seconds             PT Education - 02/16/18 1003    Education provided  Yes    Education Details  evaluation findings, POC, goals, HEP with proper form/ rationale, anatomy of the hip and surrounding musculature    Person(s) Educated  Patient    Methods  Explanation;Verbal cues;Handout    Comprehension  Verbalized understanding;Verbal cues required       PT Short Term Goals - 02/16/18 0958      PT SHORT TERM GOAL #1   Title  pt to be I with inital HEP    Time  3    Period  Weeks    Status  New    Target Date  03/09/18      PT SHORT TERM GOAL #2   Title  pt to verbalize and demo proper posture and lifting mechanics to prevent and reduce low back pain     Time  3    Period  Weeks    Status  New    Target Date  03/09/18        PT Long Term Goals - 02/16/18 0959      PT LONG TERM GOAL #1   Title  pt to increase hip extension and flexion by >/= 8 degrees for functional mobility required for proper gait mechanics    Time  6    Period  Weeks    Status  New    Target Date  03/30/18      PT LONG TERM GOAL #2   Title  pt to increase bil hip strength to >/= 4/5 in all planes to promote hip stability and maximize safety     Time  6    Period  Weeks    Status  New    Target Date  03/30/18       PT LONG TERM GOAL #3   Title  pt to be able to sit, stand and walk >/= 45 min with LRAD reporting </= 1/10 pain for functional endurance required for ADLs    Time  6    Period  Weeks    Status  New    Target Date  03/30/18      PT LONG TERM GOAL #4   Title  increase FOTO score to </= 32% limited to demo improvement in function    Time  6    Period  Weeks    Status  New    Target Date  03/30/18      PT LONG TERM GOAL #5   Title  pt to be I with all HEP given as of last visit to maintain and progress current level of function    Time  6    Period  Weeks    Status  New    Target Date  03/30/18             Plan - 02/16/18 0941    Clinical Impression Statement  pt presents to OPPT with CC of chronic low back and R posterior hip pain. he demonstrates limited trunk mobility and limited bil hip mobility specifically with hip extension/ flexion. weakness in bil hips with noted abnormal gait pattern with sustained hip flexion and limited hp extension. pt would benefit from physical therapy to decrease low back / R hip pain, improve hip / core strength, promote posture and maximize his function by addressing the deficits.     History and Personal Factors relevant to plan of care:  hx or prostate cx, pt lives alone    Clinical Presentation  Evolving    Clinical Presentation due to:  limited hip mobility, limited trunk mobility, general weakness, limited endurance.     Clinical Decision Making  Moderate    Rehab Potential  Good    PT Frequency  2x / week    PT Duration  6 weeks    PT Treatment/Interventions  ADLs/Self Care Home Management;Cryotherapy;Electrical Stimulation;Iontophoresis 4mg /ml Dexamethasone;Moist Heat;Traction;Ultrasound;Patient/family education;Therapeutic activities;Manual techniques;Therapeutic exercise;Taping;Dry needling;Passive range of motion;Balance training;Gait training    PT Next Visit Plan  review / update HEP, trunk mobility, hip/ core strength, hip flexor  stretching, STW for glute med, modalites PRn,     PT Home Exercise Plan  hip flexor stretching, clams, bridge, lower trunk rotation,     Consulted and Agree with Plan of Care  Patient       Patient will benefit from skilled therapeutic intervention in order to improve the following deficits and impairments:  Abnormal gait, Pain, Improper body mechanics, Postural dysfunction, Decreased strength, Decreased activity tolerance, Decreased endurance, Decreased range of motion, Increased fascial restricitons  Visit Diagnosis: Chronic right-sided low back pain, with sciatica presence unspecified  Pain in right hip  Muscle weakness (generalized)  Other abnormalities of gait and mobility     Problem List Patient Active Problem List   Diagnosis Date Noted  . Angina pectoris ([place]) 11/03/2016   [place] PT, DPT, LAT, ATC  02/16/18  10:04 AM      [facility] [address] [place], [place], [address] Phone: [phone]   Fax:  [phone]  Name: Justin [name] MRN: [id number] Date of Birth: [birth year]

## 2018-02-17 ENCOUNTER — Other Ambulatory Visit: Payer: Self-pay | Admitting: Orthopedic Surgery

## 2018-02-17 DIAGNOSIS — M545 Low back pain: Secondary | ICD-10-CM

## 2018-02-17 DIAGNOSIS — M25559 Pain in unspecified hip: Secondary | ICD-10-CM

## 2018-02-18 ENCOUNTER — Encounter: Payer: Self-pay | Admitting: Physical Therapy

## 2018-02-18 ENCOUNTER — Ambulatory Visit: Payer: Medicare PPO | Admitting: Physical Therapy

## 2018-02-18 DIAGNOSIS — M6281 Muscle weakness (generalized): Secondary | ICD-10-CM | POA: Diagnosis not present

## 2018-02-18 DIAGNOSIS — G8929 Other chronic pain: Secondary | ICD-10-CM | POA: Diagnosis not present

## 2018-02-18 DIAGNOSIS — R2689 Other abnormalities of gait and mobility: Secondary | ICD-10-CM

## 2018-02-18 DIAGNOSIS — M25551 Pain in right hip: Secondary | ICD-10-CM | POA: Diagnosis not present

## 2018-02-18 DIAGNOSIS — M545 Low back pain: Secondary | ICD-10-CM | POA: Diagnosis not present

## 2018-02-18 NOTE — Patient Instructions (Signed)
Hamstring Stretch: Active    Support behind right knee. Starting with knee bent, attempt to straighten knee until a comfortable stretch is felt in back of thigh. Hold _30___ seconds. Repeat ___3_ times per set. Do _1___ sets per session. Do ___1_ sessions per day.  http://orth.exer.us/158   Copyright  VHI. All rights reserved.

## 2018-02-18 NOTE — Therapy (Signed)
[place], [place], [address] Phone: [phone]   Fax:  [phone]  Physical Therapy Treatment  Patient Details  Name: Justin [name] MRN: [id number] Date of Birth: 02/04/1933 Referring Provider: [name], MD   Encounter Date: 02/18/2018  PT End of Session - 02/18/18 0928    Visit Number  2    Number of Visits  13    Date for PT Re-Evaluation  03/30/18    PT Start Time  0848    PT Stop Time  0926    PT Time Calculation (min)  38 min    Activity Tolerance  Patient tolerated treatment well    Behavior During Therapy  [facility] for tasks assessed/performed       Past Medical History:  Diagnosis Date  . Anxiety   . Arthritis   . Coronary artery disease   . Diabetes mellitus without complication ([place])   . Diverticulitis   . Dyspnea   . GERD (gastroesophageal reflux disease)   . Hypercholesteremia   . Hypertension   . Pneumonia    as a child  . Prostate cancer ([place])   . UTI (lower urinary tract infection)     Past Surgical History:  Procedure Laterality Date  . ABDOMINAL SURGERY     for diverticulitis; also removed appendix  . CATARACT EXTRACTION, BILATERAL    . CHOLECYSTECTOMY N/A 10/12/2017   Procedure: LAPAROSCOPIC CHOLECYSTECTOMY;  Surgeon: [name], MD;  Location: [facility];  Service: General;  Laterality: N/A;  . CORONARY ARTERY BYPASS GRAFT  2009  . EYE SURGERY     "for bleeding in eye"  . HERNIA REPAIR    . LEFT HEART CATH AND CORONARY ANGIOGRAPHY N/A 11/04/2016   Procedure: Left Heart Cath and Coronary Angiography;  Surgeon: [name], MD;  Location: [facility];  Service: Cardiovascular;  Laterality: N/A;  . LOWER EXTREMITY ANGIOGRAPHY N/A 11/04/2016   Procedure: Lower Extremity Angiography;  Surgeon: [name], MD;  Location: [facility];  Service: Cardiovascular;  Laterality: N/A;  . PROSTATE SURGERY      There were no vitals filed for this  visit.  Subjective Assessment - 02/18/18 0852    Subjective  Had an injection right gluteal yesterday,  Sleeping better.  Done with steroid meds.  Not going down leg.( 1 month ago)    Pain Score  3     Pain Location  Hip    Pain Orientation  Right;Posterior    Pain Descriptors / Indicators  Dull;Constant;Aching    Pain Type  Chronic pain    Aggravating Factors   longer walking standig sleeping sitting    Pain Relieving Factors  meds,,  injection    Effect of Pain on Daily Activities  always uses cane                       [facility] Adult PT Treatment/Exercise - 02/18/18 0001      Lumbar Exercises: Stretches   Lower Trunk Rotation Limitations  3  X 10  holding small Range,  end pain  4/10      Lumbar Exercises: Supine   Ab Set  10 reps    Clam  10 reps with red theraband.  no pain    Bridge  10 reps    Other Supine Lumbar Exercises  bent knee raise.  left 10 C       Knee/Hip Exercises: Public affairs consultant  2  reps;20 seconds    Hip Flexor Stretch  2 reps;30 seconds right      Manual Therapy   Manual therapy comments  instrument assist soft tissue work right side low back and gluteal.             PT Education - 02/18/18 0926    Education provided  Yes    Education Details  HEP    Person(s) Educated  Patient    Methods  Explanation;Demonstration;Verbal cues;Handout    Comprehension  Verbalized understanding;Returned demonstration       PT Short Term Goals - 02/16/18 0958      PT SHORT TERM GOAL #1   Title  pt to be I with inital HEP    Time  3    Period  Weeks    Status  New    Target Date  03/09/18      PT SHORT TERM GOAL #2   Title  pt to verbalize and demo proper posture and lifting mechanics to prevent and reduce low back pain     Time  3    Period  Weeks    Status  New    Target Date  03/09/18        PT Long Term Goals - 02/16/18 0959      PT LONG TERM GOAL #1   Title  pt to increase hip extension and flexion by >/= 8 degrees  for functional mobility required for proper gait mechanics    Time  6    Period  Weeks    Status  New    Target Date  03/30/18      PT LONG TERM GOAL #2   Title  pt to increase bil hip strength to >/= 4/5 in all planes to promote hip stability and maximize safety     Time  6    Period  Weeks    Status  New    Target Date  03/30/18      PT LONG TERM GOAL #3   Title  pt to be able to sit, stand and walk >/= 45 min with LRAD reporting </= 1/10 pain for functional endurance required for ADLs    Time  6    Period  Weeks    Status  New    Target Date  03/30/18      PT LONG TERM GOAL #4   Title  increase FOTO score to </= 32% limited to demo improvement in function    Time  6    Period  Weeks    Status  New    Target Date  03/30/18      PT LONG TERM GOAL #5   Title  pt to be I with all HEP given as of last visit to maintain and progress current level of function    Time  6    Period  Weeks    Status  New    Target Date  03/30/18            Plan - 02/18/18 0929    Clinical Impression Statement  No pain at end of session.  Progressed HEP without pain decreased.  Hip flexor WNL this morning.  Injection yesterday helpful    PT Next Visit Plan  review / update HEP, trunk mobility, hip/ core strength, hip flexor stretching, STW for glute med, modalites PRn,     PT Home Exercise Plan  hip flexor stretching, clams, bridge, lower trunk rotation, hamstring stretch  Consulted and Agree with Plan of Care  Patient       Patient will benefit from skilled therapeutic intervention in order to improve the following deficits and impairments:     Visit Diagnosis: Chronic right-sided low back pain, with sciatica presence unspecified  Pain in right hip  Muscle weakness (generalized)  Other abnormalities of gait and mobility     Problem List Patient Active Problem List   Diagnosis Date Noted  . Angina pectoris ([place]) 11/03/2016    Justin [name] PTA 02/18/2018, 9:30 AM  [facility] [address] [place], [place], [address] Phone: [phone]   Fax:  [phone]  Name: Justin [name] MRN: [id number] Date of Birth: 09/19/1933

## 2018-02-24 ENCOUNTER — Encounter: Payer: Self-pay | Admitting: Physical Therapy

## 2018-02-24 ENCOUNTER — Ambulatory Visit: Payer: Medicare PPO | Admitting: Physical Therapy

## 2018-02-24 ENCOUNTER — Ambulatory Visit
Admission: RE | Admit: 2018-02-24 | Discharge: 2018-02-24 | Disposition: A | Discharge status: Home / Self Care | Payer: Medicare PPO | Source: Ambulatory Visit | Attending: Orthopedic Surgery | Admitting: Orthopedic Surgery

## 2018-02-24 DIAGNOSIS — M545 Low back pain: Principal | ICD-10-CM

## 2018-02-24 DIAGNOSIS — M25559 Pain in unspecified hip: Secondary | ICD-10-CM

## 2018-02-24 DIAGNOSIS — R2689 Other abnormalities of gait and mobility: Secondary | ICD-10-CM | POA: Diagnosis not present

## 2018-02-24 DIAGNOSIS — G8929 Other chronic pain: Secondary | ICD-10-CM | POA: Diagnosis not present

## 2018-02-24 DIAGNOSIS — M25551 Pain in right hip: Secondary | ICD-10-CM | POA: Diagnosis not present

## 2018-02-24 DIAGNOSIS — M48061 Spinal stenosis, lumbar region without neurogenic claudication: Secondary | ICD-10-CM | POA: Diagnosis not present

## 2018-02-24 DIAGNOSIS — M6281 Muscle weakness (generalized): Secondary | ICD-10-CM | POA: Diagnosis not present

## 2018-02-24 NOTE — Therapy (Signed)
[place], [place], [address] Phone: [phone]   Fax:  [phone]  Physical Therapy Treatment  Patient Details  Name: Justin [name] MRN: [id number] Date of Birth: 12/11/1932 Referring Provider: [name], MD   Encounter Date: 02/24/2018  PT End of Session - 02/24/18 0928    Visit Number  3    Number of Visits  13    Date for PT Re-Evaluation  03/30/18    Authorization Type  MCR: KX mod by 15th, PN by 10th    PT Start Time  0923    PT Stop Time  1001    PT Time Calculation (min)  38 min    Activity Tolerance  Patient tolerated treatment well       Past Medical History:  Diagnosis Date  . Anxiety   . Arthritis   . Coronary artery disease   . Diabetes mellitus without complication ([place])   . Diverticulitis   . Dyspnea   . GERD (gastroesophageal reflux disease)   . Hypercholesteremia   . Hypertension   . Pneumonia    as a child  . Prostate cancer ([place])   . UTI (lower urinary tract infection)     Past Surgical History:  Procedure Laterality Date  . ABDOMINAL SURGERY     for diverticulitis; also removed appendix  . CATARACT EXTRACTION, BILATERAL    . CHOLECYSTECTOMY N/A 10/12/2017   Procedure: LAPAROSCOPIC CHOLECYSTECTOMY;  Surgeon: [name], MD;  Location: [facility];  Service: General;  Laterality: N/A;  . CORONARY ARTERY BYPASS GRAFT  2009  . EYE SURGERY     "for bleeding in eye"  . HERNIA REPAIR    . LEFT HEART CATH AND CORONARY ANGIOGRAPHY N/A 11/04/2016   Procedure: Left Heart Cath and Coronary Angiography;  Surgeon: [name], MD;  Location: [facility];  Service: Cardiovascular;  Laterality: N/A;  . LOWER EXTREMITY ANGIOGRAPHY N/A 11/04/2016   Procedure: Lower Extremity Angiography;  Surgeon: [name], MD;  Location: [facility];  Service: Cardiovascular;  Laterality: N/A;  . PROSTATE SURGERY      There were no vitals filed for this visit.  Subjective  Assessment - 02/24/18 0927    Subjective  Very little pain. I am doing the exercises every day.     Currently in Pain?  Yes    Pain Score  3     Pain Location  Hip    Pain Orientation  Right    Pain Descriptors / Indicators  Aching    Aggravating Factors   walking,laying on right, laying on bacl     Pain Relieving Factors  meds, injection                       [facility] Adult PT Treatment/Exercise - 02/24/18 0001      Therapeutic Activites    Therapeutic Activities  Lifting    Lifting  Demonstration of proper lifting via hip hinge to decrease strain on lumbar spine. Pt requires max cues to lift chest/ look forward while squatting to lift stool from floor.  x 3 reps correctly with cues.       Lumbar Exercises: Stretches   Single Knee to Chest Stretch  3 reps;30 seconds    Lower Trunk Rotation Limitations  2 x 10 holding end range x 2 sec    Piriformis Stretch  2 reps;30 seconds    Figure 4 Stretch  3 reps;30 seconds  Lumbar Exercises: Aerobic   Nustep  L5 UE/LE x 5 minutes       Lumbar Exercises: Seated   Sit to Stand  10 reps without UE support, focused on hip hinge mechanics      Lumbar Exercises: Supine   Clam  20 reps with red theraband.  no pain    Bent Knee Raise  20 reps with red band around knees    Bridge  10 reps    Straight Leg Raise  10 reps      Knee/Hip Exercises: Stretches   Active Hamstring Stretch  3 reps;30 seconds    Hip Flexor Stretch  2 reps;30 seconds               PT Short Term Goals - 02/16/18 0958      PT SHORT TERM GOAL #1   Title  pt to be I with inital HEP    Time  3    Period  Weeks    Status  New    Target Date  03/09/18      PT SHORT TERM GOAL #2   Title  pt to verbalize and demo proper posture and lifting mechanics to prevent and reduce low back pain     Time  3    Period  Weeks    Status  New    Target Date  03/09/18        PT Long Term Goals - 02/16/18 0959      PT LONG TERM GOAL #1   Title  pt to  increase hip extension and flexion by >/= 8 degrees for functional mobility required for proper gait mechanics    Time  6    Period  Weeks    Status  New    Target Date  03/30/18      PT LONG TERM GOAL #2   Title  pt to increase bil hip strength to >/= 4/5 in all planes to promote hip stability and maximize safety     Time  6    Period  Weeks    Status  New    Target Date  03/30/18      PT LONG TERM GOAL #3   Title  pt to be able to sit, stand and walk >/= 45 min with LRAD reporting </= 1/10 pain for functional endurance required for ADLs    Time  6    Period  Weeks    Status  New    Target Date  03/30/18      PT LONG TERM GOAL #4   Title  increase FOTO score to </= 32% limited to demo improvement in function    Time  6    Period  Weeks    Status  New    Target Date  03/30/18      PT LONG TERM GOAL #5   Title  pt to be I with all HEP given as of last visit to maintain and progress current level of function    Time  6    Period  Weeks    Status  New    Target Date  03/30/18            Plan - 02/24/18 0953    Clinical Impression Statement  Pt reprots compliance with HEP. His pain rating is the same. He reports the exercises seem to help a little. Progressed hip mobility with IR/ER stretching where he felt soreness on right during the stretch.  Cues given to stretch gently. Asked pt to demonstrate proper lifting of stool and he flexed only at lumbar spine. Time spent with educating pt on hip hinge mechanics which required moderate cues. He was able to retrun demonstrate after several reps. At end of session he reported right hip was a littel sore.     PT Treatment/Interventions  ADLs/Self Care Home Management;Cryotherapy;Electrical Stimulation;Iontophoresis 4mg /ml Dexamethasone;Moist Heat;Traction;Ultrasound;Patient/family education;Therapeutic activities;Manual techniques;Therapeutic exercise;Taping;Dry needling;Passive range of motion;Balance training;Gait training    PT  Next Visit Plan  review / update HEP, trunk mobility, hip/ core strength, hip flexor stretching, STW for glute med, modalites PRn,     PT Home Exercise Plan  hip flexor stretching, clams, bridge, lower trunk rotation, hamstring stretch    Consulted and Agree with Plan of Care  Patient       Patient will benefit from skilled therapeutic intervention in order to improve the following deficits and impairments:  Abnormal gait, Pain, Improper body mechanics, Postural dysfunction, Decreased strength, Decreased activity tolerance, Decreased endurance, Decreased range of motion, Increased fascial restricitons  Visit Diagnosis: Chronic right-sided low back pain, with sciatica presence unspecified  Pain in right hip  Muscle weakness (generalized)  Other abnormalities of gait and mobility     Problem List Patient Active Problem List   Diagnosis Date Noted  . Angina pectoris ([place]) 11/03/2016    Dorene Ar, PTA 02/24/2018, 10:28 AM  [facility] [address] [place], [place], [address] Phone: [phone]   Fax:  [phone]  Name: Justin [name] MRN: [id number] Date of Birth: 1932/12/26

## 2018-02-26 ENCOUNTER — Encounter: Payer: Self-pay | Admitting: Physical Therapy

## 2018-02-26 ENCOUNTER — Ambulatory Visit: Payer: Medicare PPO | Admitting: Physical Therapy

## 2018-02-26 DIAGNOSIS — M6281 Muscle weakness (generalized): Secondary | ICD-10-CM | POA: Diagnosis not present

## 2018-02-26 DIAGNOSIS — G8929 Other chronic pain: Secondary | ICD-10-CM | POA: Diagnosis not present

## 2018-02-26 DIAGNOSIS — M545 Low back pain: Secondary | ICD-10-CM | POA: Diagnosis not present

## 2018-02-26 DIAGNOSIS — R2689 Other abnormalities of gait and mobility: Secondary | ICD-10-CM | POA: Diagnosis not present

## 2018-02-26 DIAGNOSIS — M25551 Pain in right hip: Secondary | ICD-10-CM | POA: Diagnosis not present

## 2018-02-26 NOTE — Therapy (Signed)
[place], [place], [address] Phone: [phone]   Fax:  [phone]  Physical Therapy Treatment  Patient Details  Name: Justin [name] MRN: [id number] Date of Birth: 08-Apr-1933 Referring Provider: [name], MD   Encounter Date: 02/26/2018  PT End of Session - 02/26/18 0959    Visit Number  4    Number of Visits  13    Date for PT Re-Evaluation  03/30/18    Authorization Type  MCR: KX mod by 15th, PN by 10th    PT Start Time  0931    PT Stop Time  1012    PT Time Calculation (min)  41 min    Activity Tolerance  Patient tolerated treatment well    Behavior During Therapy  [facility] for tasks assessed/performed       Past Medical History:  Diagnosis Date  . Anxiety   . Arthritis   . Coronary artery disease   . Diabetes mellitus without complication ([place])   . Diverticulitis   . Dyspnea   . GERD (gastroesophageal reflux disease)   . Hypercholesteremia   . Hypertension   . Pneumonia    as a child  . Prostate cancer ([place])   . UTI (lower urinary tract infection)     Past Surgical History:  Procedure Laterality Date  . ABDOMINAL SURGERY     for diverticulitis; also removed appendix  . CATARACT EXTRACTION, BILATERAL    . CHOLECYSTECTOMY N/A 10/12/2017   Procedure: LAPAROSCOPIC CHOLECYSTECTOMY;  Surgeon: [name], MD;  Location: [facility];  Service: General;  Laterality: N/A;  . CORONARY ARTERY BYPASS GRAFT  2009  . EYE SURGERY     "for bleeding in eye"  . HERNIA REPAIR    . LEFT HEART CATH AND CORONARY ANGIOGRAPHY N/A 11/04/2016   Procedure: Left Heart Cath and Coronary Angiography;  Surgeon: [name], MD;  Location: [facility];  Service: Cardiovascular;  Laterality: N/A;  . LOWER EXTREMITY ANGIOGRAPHY N/A 11/04/2016   Procedure: Lower Extremity Angiography;  Surgeon: [name], MD;  Location: [facility];  Service: Cardiovascular;  Laterality: N/A;  . PROSTATE SURGERY       There were no vitals filed for this visit.  Subjective Assessment - 02/26/18 0935    Subjective  "I feel like the back is doing better with 2/10, my stomach is just giving me some trouble"     Currently in Pain?  Yes    Pain Score  2     Pain Orientation  Right    Pain Type  Chronic pain    Pain Onset  More than a month ago                       [facility] Adult PT Treatment/Exercise - 02/26/18 0936      Lumbar Exercises: Stretches   Passive Hamstring Stretch  2 reps;30 seconds;Right;Left PNF contract/ relax with 10 sec hold    Other Lumbar Stretch Exercise  QL stretch on the R in L sidelying  2x  30 sec      Lumbar Exercises: Aerobic   Nustep  L5 UE/LE x 6 minutes       Lumbar Exercises: Seated   Sit to Stand  10 reps with cues to keep core tight     Other Seated Lumbar Exercises  seated on dyna disc pelvic tilt 1 x 10 hold anterior tilt 5 sec, sustained anterior tilt alternating marching 2  x 10      Lumbar Exercises: Supine   Bridge  10 reps x 2 sets, with glute squeeze      Knee/Hip Exercises: Standing   Heel Raises  1 set;20 reps;Both bil HHA on counter for balance    Forward Step Up  Both;2 sets;Step Height: 4"    Step Down  10 reps;2 sets    Wall Squat  10 reps;2 sets      Knee/Hip Exercises: Supine   Other Supine Knee/Hip Exercises  unilateral clamshell with Shugart theraband 2 x 12 bil             PT Education - 02/26/18 0959    Education provided  Yes    Education Details  updated Hep for seated pelvic tilt    Person(s) Educated  Patient    Methods  Explanation;Verbal cues;Handout;Demonstration    Comprehension  Verbalized understanding;Verbal cues required       PT Short Term Goals - 02/16/18 0958      PT SHORT TERM GOAL #1   Title  pt to be I with inital HEP    Time  3    Period  Weeks    Status  New    Target Date  03/09/18      PT SHORT TERM GOAL #2   Title  pt to verbalize and demo proper posture and lifting mechanics to  prevent and reduce low back pain     Time  3    Period  Weeks    Status  New    Target Date  03/09/18        PT Long Term Goals - 02/16/18 0959      PT LONG TERM GOAL #1   Title  pt to increase hip extension and flexion by >/= 8 degrees for functional mobility required for proper gait mechanics    Time  6    Period  Weeks    Status  New    Target Date  03/30/18      PT LONG TERM GOAL #2   Title  pt to increase bil hip strength to >/= 4/5 in all planes to promote hip stability and maximize safety     Time  6    Period  Weeks    Status  New    Target Date  03/30/18      PT LONG TERM GOAL #3   Title  pt to be able to sit, stand and walk >/= 45 min with LRAD reporting </= 1/10 pain for functional endurance required for ADLs    Time  6    Period  Weeks    Status  New    Target Date  03/30/18      PT LONG TERM GOAL #4   Title  increase FOTO score to </= 32% limited to demo improvement in function    Time  6    Period  Weeks    Status  New    Target Date  03/30/18      PT LONG TERM GOAL #5   Title  pt to be I with all HEP given as of last visit to maintain and progress current level of function    Time  6    Period  Weeks    Status  New    Target Date  03/30/18            Plan - 02/26/18 1011    Clinical Impression Statement  continued  relief of pain and compliance with HEP. conintued hip/ core strengthening today. he did report stomach upset initially today so monitored throughout session. He performed all exercises with no issues and reported no pain at the end of the session. updated HEp for pelvic tilt today.     PT Treatment/Interventions  ADLs/Self Care Home Management;Cryotherapy;Electrical Stimulation;Iontophoresis 4mg /ml Dexamethasone;Moist Heat;Traction;Ultrasound;Patient/family education;Therapeutic activities;Manual techniques;Therapeutic exercise;Taping;Dry needling;Passive range of motion;Balance training;Gait training    PT Next Visit Plan  review /  update HEP, trunk mobility, hip/ core strength, hip flexor stretching, STW for glute med, modalites PRn,     PT Home Exercise Plan  hip flexor stretching, clams, bridge, lower trunk rotation, hamstring stretch    Consulted and Agree with Plan of Care  Patient       Patient will benefit from skilled therapeutic intervention in order to improve the following deficits and impairments:  Abnormal gait, Pain, Improper body mechanics, Postural dysfunction, Decreased strength, Decreased activity tolerance, Decreased endurance, Decreased range of motion, Increased fascial restricitons  Visit Diagnosis: Chronic right-sided low back pain, with sciatica presence unspecified  Pain in right hip  Muscle weakness (generalized)  Other abnormalities of gait and mobility     Problem List Patient Active Problem List   Diagnosis Date Noted  . Angina pectoris ([place]) 11/03/2016   Starr Lake PT, DPT, LAT, ATC  02/26/18  10:15 AM      [facility] [address] [place], [place], [address] Phone: [phone]   Fax:  [phone]  Name: Izrael Peak MRN: [id number] Date of Birth: 05-15-1933

## 2018-03-02 ENCOUNTER — Encounter: Payer: Self-pay | Admitting: Physical Therapy

## 2018-03-02 ENCOUNTER — Ambulatory Visit: Payer: Medicare PPO | Attending: Internal Medicine | Admitting: Physical Therapy

## 2018-03-02 DIAGNOSIS — M6281 Muscle weakness (generalized): Secondary | ICD-10-CM | POA: Diagnosis not present

## 2018-03-02 DIAGNOSIS — G8929 Other chronic pain: Secondary | ICD-10-CM | POA: Diagnosis not present

## 2018-03-02 DIAGNOSIS — R2689 Other abnormalities of gait and mobility: Secondary | ICD-10-CM | POA: Diagnosis not present

## 2018-03-02 DIAGNOSIS — M25551 Pain in right hip: Secondary | ICD-10-CM | POA: Insufficient documentation

## 2018-03-02 DIAGNOSIS — M545 Low back pain: Secondary | ICD-10-CM | POA: Diagnosis not present

## 2018-03-02 NOTE — Patient Instructions (Signed)
Gastroc Stretch    Stand with right foot back, leg straight, forward leg bent. Keeping heel on floor, turned slightly out, lean into wall until stretch is felt in calf. Hold 30 seconds 3 x each.  Gentle stretch.  Daily.  http://orth.exer.us/26   Copyright  VHI. All rights reserved.

## 2018-03-02 NOTE — Therapy (Signed)
[place], [place], [address] Phone: [phone]   Fax:  [phone]  Physical Therapy Treatment  Patient Details  Name: Justin [name] MRN: [id number] Date of Birth: 08-05-1933 Referring Provider: [name], MD   Encounter Date: 03/02/2018  PT End of Session - 03/02/18 0930    Visit Number  5    Number of Visits  13    Date for PT Re-Evaluation  03/30/18    PT Start Time  0848    PT Stop Time  0928    PT Time Calculation (min)  40 min    Activity Tolerance  Patient tolerated treatment well    Behavior During Therapy  [facility] for tasks assessed/performed       Past Medical History:  Diagnosis Date  . Anxiety   . Arthritis   . Coronary artery disease   . Diabetes mellitus without complication ([place])   . Diverticulitis   . Dyspnea   . GERD (gastroesophageal reflux disease)   . Hypercholesteremia   . Hypertension   . Pneumonia    as a child  . Prostate cancer ([place])   . UTI (lower urinary tract infection)     Past Surgical History:  Procedure Laterality Date  . ABDOMINAL SURGERY     for diverticulitis; also removed appendix  . CATARACT EXTRACTION, BILATERAL    . CHOLECYSTECTOMY N/A 10/12/2017   Procedure: LAPAROSCOPIC CHOLECYSTECTOMY;  Surgeon: [name], MD;  Location: [place];  Service: General;  Laterality: N/A;  . CORONARY ARTERY BYPASS GRAFT  2009  . EYE SURGERY     "for bleeding in eye"  . HERNIA REPAIR    . LEFT HEART CATH AND CORONARY ANGIOGRAPHY N/A 11/04/2016   Procedure: Left Heart Cath and Coronary Angiography;  Surgeon: [name], MD;  Location: [place] [facility];  Service: Cardiovascular;  Laterality: N/A;  . LOWER EXTREMITY ANGIOGRAPHY N/A 11/04/2016   Procedure: Lower Extremity Angiography;  Surgeon: [name], MD;  Location: [place] [facility];  Service: Cardiovascular;  Laterality: N/A;  . PROSTATE SURGERY      There were no vitals filed for this visit.  Subjective  Assessment - 03/02/18 0852    Subjective  Pain 2/10.  Able to move the hip more. Stomach flared .  can lay on right side more.     Pain Score  2     Pain Location  Back    Pain Orientation  Right    Pain Descriptors / Indicators  Aching    Pain Type  Chronic pain    Pain Frequency  Intermittent    Aggravating Factors   walking    Pain Relieving Factors  meds,  injection    Effect of Pain on Daily Activities  alwals uses the cane                       [facility] Adult PT Treatment/Exercise - 03/02/18 0001      Lumbar Exercises: Stretches   Passive Hamstring Stretch  3 reps;30 seconds    Double Knee to Chest Stretch  -- 10 x legs on ball,  cues initially    Lower Trunk Rotation  -- 10 x legs on ball    Hip Flexor Stretch  3 reps;20 seconds    Gastroc Stretch  4 reps;30 seconds incline and wall HEP    Other Lumbar Stretch Exercise  PROM on side left  PNF stretch 10 X,  hip drop  away from rib on side 10 X AA    Other Lumbar Stretch Exercise  Hip IR/ER stretch 2 x 20, PROM both      Lumbar Exercises: Supine   Glut Set  10 reps    Bent Knee Raise  10 reps    Bridge  10 reps    Other Supine Lumbar Exercises  bent knee raise.                PT Education - 03/02/18 0928    Education Details  HEP    Person(s) Educated  Patient    Methods  Explanation;Demonstration;Verbal cues;Handout    Comprehension  Verbalized understanding;Returned demonstration       PT Short Term Goals - 02/16/18 0958      PT SHORT TERM GOAL #1   Title  pt to be I with inital HEP    Time  3    Period  Weeks    Status  New    Target Date  03/09/18      PT SHORT TERM GOAL #2   Title  pt to verbalize and demo proper posture and lifting mechanics to prevent and reduce low back pain     Time  3    Period  Weeks    Status  New    Target Date  03/09/18        PT Long Term Goals - 02/16/18 0959      PT LONG TERM GOAL #1   Title  pt to increase hip extension and flexion by >/= 8  degrees for functional mobility required for proper gait mechanics    Time  6    Period  Weeks    Status  New    Target Date  03/30/18      PT LONG TERM GOAL #2   Title  pt to increase bil hip strength to >/= 4/5 in all planes to promote hip stability and maximize safety     Time  6    Period  Weeks    Status  New    Target Date  03/30/18      PT LONG TERM GOAL #3   Title  pt to be able to sit, stand and walk >/= 45 min with LRAD reporting </= 1/10 pain for functional endurance required for ADLs    Time  6    Period  Weeks    Status  New    Target Date  03/30/18      PT LONG TERM GOAL #4   Title  increase FOTO score to </= 32% limited to demo improvement in function    Time  6    Period  Weeks    Status  New    Target Date  03/30/18      PT LONG TERM GOAL #5   Title  pt to be I with all HEP given as of last visit to maintain and progress current level of function    Time  6    Period  Weeks    Status  New    Target Date  03/30/18            Plan - 03/02/18 0931    Clinical Impression Statement  1/10 pain at end of session,  able to lengthen calf ROM with stretching,  Hip extension right to neutral.      PT Next Visit Plan  review / update HEP, trunk mobility, hip/ core strength, hip flexor stretching,  STW for glute med, modalites PRn,     PT Home Exercise Plan  hip flexor stretching, clams, bridge, lower trunk rotation, hamstring stretch,  calf stretch    Consulted and Agree with Plan of Care  Patient       Patient will benefit from skilled therapeutic intervention in order to improve the following deficits and impairments:     Visit Diagnosis: Chronic right-sided low back pain, with sciatica presence unspecified  Pain in right hip  Muscle weakness (generalized)  Other abnormalities of gait and mobility     Problem List Patient Active Problem List   Diagnosis Date Noted  . Angina pectoris ([place]) 11/03/2016    Katrinia Straker  PTA 03/02/2018, 1:09  PM  [facility] [address] [place], [place], [address] Phone: [phone]   Fax:  [phone]  Name: Locklan Canoy MRN: [id number] Date of Birth: [birth year]

## 2018-03-04 ENCOUNTER — Encounter: Payer: Medicare PPO | Admitting: Physical Therapy

## 2018-03-05 DIAGNOSIS — K219 Gastro-esophageal reflux disease without esophagitis: Secondary | ICD-10-CM | POA: Diagnosis not present

## 2018-03-05 DIAGNOSIS — R1084 Generalized abdominal pain: Secondary | ICD-10-CM | POA: Diagnosis not present

## 2018-03-09 ENCOUNTER — Ambulatory Visit: Payer: Medicare PPO | Admitting: Physical Therapy

## 2018-03-09 ENCOUNTER — Encounter: Payer: Self-pay | Admitting: Physical Therapy

## 2018-03-09 DIAGNOSIS — M25551 Pain in right hip: Secondary | ICD-10-CM | POA: Diagnosis not present

## 2018-03-09 DIAGNOSIS — M545 Low back pain: Principal | ICD-10-CM

## 2018-03-09 DIAGNOSIS — G8929 Other chronic pain: Secondary | ICD-10-CM | POA: Diagnosis not present

## 2018-03-09 DIAGNOSIS — M6281 Muscle weakness (generalized): Secondary | ICD-10-CM

## 2018-03-09 DIAGNOSIS — R2689 Other abnormalities of gait and mobility: Secondary | ICD-10-CM | POA: Diagnosis not present

## 2018-03-09 NOTE — Therapy (Signed)
[place], [place], [address] Phone: [phone]   Fax:  [phone]  Physical Therapy Treatment  Patient Details  Name: Justin [name] MRN: [id number] Date of Birth: 01-Nov-1932 Referring Provider: [name], MD   Encounter Date: 03/09/2018  PT End of Session - 03/09/18 0930    Visit Number  6    Number of Visits  13    Date for PT Re-Evaluation  03/30/18    Authorization Type  MCR: KX mod by 15th, PN by 10th    PT Start Time  0850    PT Stop Time  0928    PT Time Calculation (min)  38 min    Activity Tolerance  Patient tolerated treatment well    Behavior During Therapy  [facility] for tasks assessed/performed       Past Medical History:  Diagnosis Date  . Anxiety   . Arthritis   . Coronary artery disease   . Diabetes mellitus without complication ([place])   . Diverticulitis   . Dyspnea   . GERD (gastroesophageal reflux disease)   . Hypercholesteremia   . Hypertension   . Pneumonia    as a child  . Prostate cancer ([place])   . UTI (lower urinary tract infection)     Past Surgical History:  Procedure Laterality Date  . ABDOMINAL SURGERY     for diverticulitis; also removed appendix  . CATARACT EXTRACTION, BILATERAL    . CHOLECYSTECTOMY N/A 10/12/2017   Procedure: LAPAROSCOPIC CHOLECYSTECTOMY;  Surgeon: [name], MD;  Location: [place];  Service: General;  Laterality: N/A;  . CORONARY ARTERY BYPASS GRAFT  2009  . EYE SURGERY     "for bleeding in eye"  . HERNIA REPAIR    . LEFT HEART CATH AND CORONARY ANGIOGRAPHY N/A 11/04/2016   Procedure: Left Heart Cath and Coronary Angiography;  Surgeon: [name], MD;  Location: [place] [facility];  Service: Cardiovascular;  Laterality: N/A;  . LOWER EXTREMITY ANGIOGRAPHY N/A 11/04/2016   Procedure: Lower Extremity Angiography;  Surgeon: [name], MD;  Location: [place];  Service: Cardiovascular;  Laterality: N/A;  . PROSTATE SURGERY       There were no vitals filed for this visit.  Subjective Assessment - 03/09/18 0853    Subjective  "doing pretty good, pain only at 1/10 today. doing exercise every  day"     Currently in Pain?  Yes    Pain Score  1     Pain Location  Back    Pain Orientation  Right    Pain Descriptors / Indicators  Sore    Pain Type  Chronic pain    Pain Onset  More than a month ago    Pain Frequency  Occasional    Aggravating Factors   doing the exercises    Pain Relieving Factors  Stop                       [facility] Adult PT Treatment/Exercise - 03/09/18 0857      Lumbar Exercises: Stretches   Active Hamstring Stretch  2 reps;30 seconds;Left;Right reviewed for proper form using strap     Active Hamstring Stretch Limitations  stop using hands and use strap at home to prevent low back pain      Lumbar Exercises: Aerobic   Nustep  L6 x 6 min UE/LE progress endurance      Lumbar Exercises: Seated   Other Seated Lumbar Exercises  seated on dynadisc: sustained anterior pelvic tilt with ADIM, marcing 2 x 10.  1 x 10 bil horizontal lift/ chip with red theraband (demonstration for proper form)      Lumbar Exercises: Supine   Bent Knee Raise  10 reps    Bent Knee Raise Limitations  alternating L/R with theraband under back for cues to keep poster pelvic tilt    Bridge  10 reps keeping posterior pelvic tilt throughout exercise      Knee/Hip Exercises: Seated   Sit to Sand  2 sets;10 reps;without UE support red theraband around knees for glute med activation             PT Education - 03/09/18 0932    Education provided  Yes    Education Details  modified stretching of hamstring using strap     Person(s) Educated  Patient    Methods  Explanation;Verbal cues;Tactile cues    Comprehension  Verbalized understanding;Verbal cues required       PT Short Term Goals - 02/16/18 0958      PT SHORT TERM GOAL #1   Title  pt to be I with inital HEP    Time  3    Period  Weeks     Status  New    Target Date  03/09/18      PT SHORT TERM GOAL #2   Title  pt to verbalize and demo proper posture and lifting mechanics to prevent and reduce low back pain     Time  3    Period  Weeks    Status  New    Target Date  03/09/18        PT Long Term Goals - 02/16/18 0959      PT LONG TERM GOAL #1   Title  pt to increase hip extension and flexion by >/= 8 degrees for functional mobility required for proper gait mechanics    Time  6    Period  Weeks    Status  New    Target Date  03/30/18      PT LONG TERM GOAL #2   Title  pt to increase bil hip strength to >/= 4/5 in all planes to promote hip stability and maximize safety     Time  6    Period  Weeks    Status  New    Target Date  03/30/18      PT LONG TERM GOAL #3   Title  pt to be able to sit, stand and walk >/= 45 min with LRAD reporting </= 1/10 pain for functional endurance required for ADLs    Time  6    Period  Weeks    Status  New    Target Date  03/30/18      PT LONG TERM GOAL #4   Title  increase FOTO score to </= 32% limited to demo improvement in function    Time  6    Period  Weeks    Status  New    Target Date  03/30/18      PT LONG TERM GOAL #5   Title  pt to be I with all HEP given as of last visit to maintain and progress current level of function    Time  6    Period  Weeks    Status  New    Target Date  03/30/18            Plan -  03/09/18 0930    Clinical Impression Statement  reportd 1/10 intially today. continued focus on core and hip strengthening which he performed well with demonstration. modified hamstring stretch to using strap to avoid over activating low back which he reported decreased pain. end of session pt reported no pain.     PT Next Visit Plan  pdate HEP, trunk mobility, hip/ core strength, balance, nearing discharge.     PT Home Exercise Plan  hip flexor stretching, clams, bridge, lower trunk rotation, hamstring stretch,  calf stretch    Consulted and Agree with  Plan of Care  Patient       Patient will benefit from skilled therapeutic intervention in order to improve the following deficits and impairments:     Visit Diagnosis: Chronic right-sided low back pain, with sciatica presence unspecified  Pain in right hip  Muscle weakness (generalized)  Other abnormalities of gait and mobility     Problem List Patient Active Problem List   Diagnosis Date Noted  . Angina pectoris ([place]) 11/03/2016   Starr Lake PT, DPT, LAT, ATC  03/09/18  9:34 AM      [facility] [address] [place], [place], [address] Phone: [phone]   Fax:  [phone]  Name: Tyquez Hollibaugh MRN: [id number] Date of Birth: 08-Oct-1932

## 2018-03-11 ENCOUNTER — Ambulatory Visit: Payer: Medicare PPO | Admitting: Physical Therapy

## 2018-03-11 ENCOUNTER — Encounter: Payer: Self-pay | Admitting: Physical Therapy

## 2018-03-11 DIAGNOSIS — M6281 Muscle weakness (generalized): Secondary | ICD-10-CM

## 2018-03-11 DIAGNOSIS — M25551 Pain in right hip: Secondary | ICD-10-CM | POA: Diagnosis not present

## 2018-03-11 DIAGNOSIS — M545 Low back pain: Secondary | ICD-10-CM | POA: Diagnosis not present

## 2018-03-11 DIAGNOSIS — R2689 Other abnormalities of gait and mobility: Secondary | ICD-10-CM

## 2018-03-11 DIAGNOSIS — G8929 Other chronic pain: Secondary | ICD-10-CM

## 2018-03-11 NOTE — Therapy (Signed)
[name], [place], [address] Phone: [phone]   Fax:  [phone]  Physical Therapy Treatment  Patient Details  Name: Justin [name] MRN: [id number] Date of Birth: 11-Aug-1933 Referring Provider: [name], MD   Encounter Date: 03/11/2018  PT End of Session - 03/11/18 0846    Visit Number  7    Number of Visits  13    Date for PT Re-Evaluation  03/30/18    PT Start Time  7026    PT Stop Time  0927    PT Time Calculation (min)  40 min    Activity Tolerance  Patient tolerated treatment well    Behavior During Therapy  [facility] for tasks assessed/performed       Past Medical History:  Diagnosis Date  . Anxiety   . Arthritis   . Coronary artery disease   . Diabetes mellitus without complication ([place])   . Diverticulitis   . Dyspnea   . GERD (gastroesophageal reflux disease)   . Hypercholesteremia   . Hypertension   . Pneumonia    as a child  . Prostate cancer ([place])   . UTI (lower urinary tract infection)     Past Surgical History:  Procedure Laterality Date  . ABDOMINAL SURGERY     for diverticulitis; also removed appendix  . CATARACT EXTRACTION, BILATERAL    . CHOLECYSTECTOMY N/A 10/12/2017   Procedure: LAPAROSCOPIC CHOLECYSTECTOMY;  Surgeon: [name], MD;  Location: [place];  Service: General;  Laterality: N/A;  . CORONARY ARTERY BYPASS GRAFT  2009  . EYE SURGERY     "for bleeding in eye"  . HERNIA REPAIR    . LEFT HEART CATH AND CORONARY ANGIOGRAPHY N/A 11/04/2016   Procedure: Left Heart Cath and Coronary Angiography;  Surgeon: [name], MD;  Location: [place] [facility];  Service: Cardiovascular;  Laterality: N/A;  . LOWER EXTREMITY ANGIOGRAPHY N/A 11/04/2016   Procedure: Lower Extremity Angiography;  Surgeon: [name], MD;  Location: [place] [facility];  Service: Cardiovascular;  Laterality: N/A;  . PROSTATE SURGERY      There were no vitals filed for this  visit.  Subjective Assessment - 03/11/18 0847    Subjective  "still doing pretty good, pain still is only about a 1/10"    Currently in Pain?  Yes    Pain Score  1                        [facility] Adult PT Treatment/Exercise - 03/11/18 0854      Lumbar Exercises: Stretches   Lower Trunk Rotation Limitations  2 x 10      Lumbar Exercises: Aerobic   Nustep  L5 x 7 min UE/LE working into endurnace, perofrmed at end of session      Lumbar Exercises: Supine   Bent Knee Raise  10 reps alternating L/R, keeing core tight/ back flat    Bent Knee Raise Limitations  2nd set progressed to up/up down/ down of R./L while keeping core tight 2 x 5  (verbal cues for proper progression/ form)      Knee/Hip Exercises: Standing   Hip Abduction  2 sets;10 reps;Knee straight Bil HHA from counter and demonstration for proper form     Hip Extension  2 sets;Knee straight;10 reps;Stengthening Bil HHA from counter and demonstration for proper form     Forward Step Up  2 sets;10 reps;Step Height: 6" 1 HHA from counter for support  PRN      Knee/Hip Exercises: Seated   Sit to Sand  2 sets;10 reps;without UE support with Dinunzio band around knees for added glute activation          Balance Exercises - 03/11/18 0915      Balance Exercises: Standing   Standing Eyes Opened  2 reps;30 secs;Narrow base of support (BOS);Solid surface    Standing Eyes Closed  Narrow base of support (BOS);2 reps;30 secs;Solid surface    Tandem Stance  -- significant increase in postural sway        PT Education - 03/11/18 0858    Education provided  Yes    Education Details  updated HEP for corner balance and benefits    Person(s) Educated  Patient    Methods  Explanation;Verbal cues;Handout;Demonstration    Comprehension  Verbalized understanding;Verbal cues required;Returned demonstration       PT Short Term Goals - 03/11/18 0918      PT SHORT TERM GOAL #1   Title  pt to be I with inital HEP    Time  3     Period  Weeks    Status  Achieved      PT SHORT TERM GOAL #2   Title  pt to verbalize and demo proper posture and lifting mechanics to prevent and reduce low back pain     Time  3    Period  Weeks    Status  Achieved        PT Long Term Goals - 02/16/18 0959      PT LONG TERM GOAL #1   Title  pt to increase hip extension and flexion by >/= 8 degrees for functional mobility required for proper gait mechanics    Time  6    Period  Weeks    Status  New    Target Date  03/30/18      PT LONG TERM GOAL #2   Title  pt to increase bil hip strength to >/= 4/5 in all planes to promote hip stability and maximize safety     Time  6    Period  Weeks    Status  New    Target Date  03/30/18      PT LONG TERM GOAL #3   Title  pt to be able to sit, stand and walk >/= 45 min with LRAD reporting </= 1/10 pain for functional endurance required for ADLs    Time  6    Period  Weeks    Status  New    Target Date  03/30/18      PT LONG TERM GOAL #4   Title  increase FOTO score to </= 32% limited to demo improvement in function    Time  6    Period  Weeks    Status  New    Target Date  03/30/18      PT LONG TERM GOAL #5   Title  pt to be I with all HEP given as of last visit to maintain and progress current level of function    Time  6    Period  Weeks    Status  New    Target Date  03/30/18            Plan - 03/11/18 0916    Clinical Impression Statement  continued 1/10 pain reported today. focused on hip / core strengthening progressing to standing which he peroformed well but fatigues quickly. he met  all STG's today. began balance training today starting with [place] postion, providing handout for HEP. continued endurance focus on Nu-step. end of session he reported no pain and declined modalities.     PT Treatment/Interventions  ADLs/Self Care Home Management;Cryotherapy;Electrical Stimulation;Iontophoresis 108m/ml Dexamethasone;Moist Heat;Traction;Ultrasound;Patient/family  education;Therapeutic activities;Manual techniques;Therapeutic exercise;Taping;Dry needling;Passive range of motion;Balance training;Gait training    PT Next Visit Plan  pdate HEP, trunk mobility, hip/ core strength, balance, nearing discharge.     PT Home Exercise Plan  hip flexor stretching, clams, bridge, lower trunk rotation, hamstring stretch,  calf stretch, corner balance.     Consulted and Agree with Plan of Care  Patient       Patient will benefit from skilled therapeutic intervention in order to improve the following deficits and impairments:  Abnormal gait, Pain, Improper body mechanics, Postural dysfunction, Decreased strength, Decreased activity tolerance, Decreased endurance, Decreased range of motion, Increased fascial restricitons  Visit Diagnosis: Chronic right-sided low back pain, with sciatica presence unspecified  Pain in right hip  Muscle weakness (generalized)  Other abnormalities of gait and mobility     Problem List Patient Active Problem List   Diagnosis Date Noted  . [place] 11/03/2016   KStarr LakePT, DPT, LAT, ATC  03/11/18  9:20 AM      [facility] [address]Phone: [phone]  Fax:  [phone] Name: Justin HaberlandMRN: [id number] of Birth: 6Feb 14, 1934

## 2018-03-16 ENCOUNTER — Encounter: Payer: Self-pay | Admitting: Physical Therapy

## 2018-03-16 ENCOUNTER — Ambulatory Visit: Payer: Medicare PPO | Admitting: Physical Therapy

## 2018-03-16 DIAGNOSIS — G8929 Other chronic pain: Secondary | ICD-10-CM

## 2018-03-16 DIAGNOSIS — M5136 Other intervertebral disc degeneration, lumbar region: Secondary | ICD-10-CM | POA: Diagnosis not present

## 2018-03-16 DIAGNOSIS — M6281 Muscle weakness (generalized): Secondary | ICD-10-CM | POA: Diagnosis not present

## 2018-03-16 DIAGNOSIS — N183 Chronic kidney disease, stage 3 (moderate): Secondary | ICD-10-CM | POA: Diagnosis not present

## 2018-03-16 DIAGNOSIS — M545 Low back pain: Principal | ICD-10-CM

## 2018-03-16 DIAGNOSIS — N189 Chronic kidney disease, unspecified: Secondary | ICD-10-CM | POA: Diagnosis not present

## 2018-03-16 DIAGNOSIS — M25551 Pain in right hip: Secondary | ICD-10-CM | POA: Diagnosis not present

## 2018-03-16 DIAGNOSIS — R2689 Other abnormalities of gait and mobility: Secondary | ICD-10-CM

## 2018-03-16 NOTE — Therapy (Signed)
[place], [place], [address] Phone: [phone]   Fax:  [phone]  Physical Therapy Treatment  Patient Details  Name: Justin [name] MRN: [id number] Date of Birth: August 09, 1933 Referring Provider: [name], MD   Encounter Date: 03/16/2018  PT End of Session - 03/16/18 1327    Visit Number  8    Number of Visits  13    Date for PT Re-Evaluation  03/30/18    PT Start Time  4818    PT Stop Time  0930    PT Time Calculation (min)  43 min    Activity Tolerance  Patient tolerated treatment well    Behavior During Therapy  [facility] for tasks assessed/performed       Past Medical History:  Diagnosis Date  . Anxiety   . Arthritis   . Coronary artery disease   . Diabetes mellitus without complication ([place])   . Diverticulitis   . Dyspnea   . GERD (gastroesophageal reflux disease)   . Hypercholesteremia   . Hypertension   . Pneumonia    as a child  . Prostate cancer ([place])   . UTI (lower urinary tract infection)     Past Surgical History:  Procedure Laterality Date  . ABDOMINAL SURGERY     for diverticulitis; also removed appendix  . CATARACT EXTRACTION, BILATERAL    . CHOLECYSTECTOMY N/A 10/12/2017   Procedure: LAPAROSCOPIC CHOLECYSTECTOMY;  Surgeon: [name], MD;  Location: [place];  Service: General;  Laterality: N/A;  . CORONARY ARTERY BYPASS GRAFT  2009  . EYE SURGERY     "for bleeding in eye"  . HERNIA REPAIR    . LEFT HEART CATH AND CORONARY ANGIOGRAPHY N/A 11/04/2016   Procedure: Left Heart Cath and Coronary Angiography;  Surgeon: [name], MD;  Location: [place] [facility];  Service: Cardiovascular;  Laterality: N/A;  . LOWER EXTREMITY ANGIOGRAPHY N/A 11/04/2016   Procedure: Lower Extremity Angiography;  Surgeon: [name], MD;  Location: [place] [facility];  Service: Cardiovascular;  Laterality: N/A;  . PROSTATE SURGERY      There were no vitals filed for this  visit.  Subjective Assessment - 03/16/18 0855    Subjective  Pain closer to 0/10.  I don't have much Pain.  I have enough exercises at  home.     Currently in Pain?  No/denies    Pain Location  Back    Pain Orientation  Right    Pain Frequency  Occasional    Aggravating Factors   moving hips hurts thighs and back.  better now than it was 2-3 weeks ago    Pain Relieving Factors  rest    Effect of Pain on Daily Activities  uses the cane         [facility] PT Assessment - 03/16/18 0001      Strength   Right Hip ABduction  4/5    Left Hip ABduction  4+/5                   [facility] Adult PT Treatment/Exercise - 03/16/18 0001      Balance   Balance Assessed  -- multiple balance exercises in bars static an dynamic,  eyes       High Level Balance   High Level Balance Comments  in parallel bars on compliant and non compliant      Lumbar Exercises: Stretches   Gastroc Stretch  3 reps;30 seconds incline toes  Lumbar Exercises: Aerobic   Nustep  L5 x 5.5  min UE/LE working into endurnace, perofrmed at end of session      Lumbar Exercises: Sidelying   Hip Abduction  10 reps;Both      Knee/Hip Exercises: Standing   Functional Squat  10 reps pole used mod cues             PT Education - 03/16/18 1327    Education provided  Yes    Education Details  balance ex form    Person(s) Educated  Patient    Methods  Explanation;Demonstration;Tactile cues    Comprehension  Verbalized understanding;Returned demonstration       PT Short Term Goals - 03/11/18 0918      PT SHORT TERM GOAL #1   Title  pt to be I with inital HEP    Time  3    Period  Weeks    Status  Achieved      PT SHORT TERM GOAL #2   Title  pt to verbalize and demo proper posture and lifting mechanics to prevent and reduce low back pain     Time  3    Period  Weeks    Status  Achieved        PT Long Term Goals - 02/16/18 0959      PT LONG TERM GOAL #1   Title  pt to increase hip extension  and flexion by >/= 8 degrees for functional mobility required for proper gait mechanics    Time  6    Period  Weeks    Status  New    Target Date  03/30/18      PT LONG TERM GOAL #2   Title  pt to increase bil hip strength to >/= 4/5 in all planes to promote hip stability and maximize safety     Time  6    Period  Weeks    Status  New    Target Date  03/30/18      PT LONG TERM GOAL #3   Title  pt to be able to sit, stand and walk >/= 45 min with LRAD reporting </= 1/10 pain for functional endurance required for ADLs    Time  6    Period  Weeks    Status  New    Target Date  03/30/18      PT LONG TERM GOAL #4   Title  increase FOTO score to </= 32% limited to demo improvement in function    Time  6    Period  Weeks    Status  New    Target Date  03/30/18      PT LONG TERM GOAL #5   Title  pt to be I with all HEP given as of last visit to maintain and progress current level of function    Time  6    Period  Weeks    Status  New    Target Date  03/30/18            Plan - 03/16/18 1328    Clinical Impression Statement  No pain in back today.  Patient showed improved balance with increased reps in parallel bars today.  Strength in hips 4 to 4+/5 for hip abduction.     PT Next Visit Plan  pdate HEP, trunk mobility, hip/ core strength, balance, nearing discharge.     PT Home Exercise Plan  hip flexor stretching, clams, bridge, lower  trunk rotation, hamstring stretch,  calf stretch, corner balance.     Consulted and Agree with Plan of Care  Patient       Patient will benefit from skilled therapeutic intervention in order to improve the following deficits and impairments:     Visit Diagnosis: Chronic right-sided low back pain, with sciatica presence unspecified  Pain in right hip  Muscle weakness (generalized)  Other abnormalities of gait and mobility     Problem List Patient Active Problem List   Diagnosis Date Noted  . Angina pectoris ([place]) 11/03/2016     HARRIS,KAREN PTA 03/16/2018, 1:29 PM  [facility] [address] [place], [place], [address] Phone: [phone]   Fax:  [phone]  Name: Marce Charlesworth MRN: [id number] Date of Birth: [birth year]

## 2018-03-18 ENCOUNTER — Ambulatory Visit: Payer: Medicare PPO | Admitting: Physical Therapy

## 2018-03-18 ENCOUNTER — Encounter: Payer: Self-pay | Admitting: Physical Therapy

## 2018-03-18 DIAGNOSIS — M25551 Pain in right hip: Secondary | ICD-10-CM | POA: Diagnosis not present

## 2018-03-18 DIAGNOSIS — M545 Low back pain: Secondary | ICD-10-CM | POA: Diagnosis not present

## 2018-03-18 DIAGNOSIS — G8929 Other chronic pain: Secondary | ICD-10-CM

## 2018-03-18 DIAGNOSIS — R2689 Other abnormalities of gait and mobility: Secondary | ICD-10-CM | POA: Diagnosis not present

## 2018-03-18 DIAGNOSIS — M6281 Muscle weakness (generalized): Secondary | ICD-10-CM | POA: Diagnosis not present

## 2018-03-18 NOTE — Therapy (Signed)
[place], [place], [address] Phone: [phone]   Fax:  [phone]  Physical Therapy Treatment / Discharge Summary  Patient Details  Name: Khan Chura MRN: [id number] Date of Birth: [birth year] Referring Provider: [name], MD   Encounter Date: 03/18/2018  PT End of Session - 03/18/18 0839    Visit Number  9    Number of Visits  13    Date for PT Re-Evaluation  03/30/18    Authorization Type  MCR: KX mod by 15th, PN by 10th    PT Start Time  0846    PT Stop Time  0924    PT Time Calculation (min)  38 min    Activity Tolerance  Patient tolerated treatment well    Behavior During Therapy  [facility] for tasks assessed/performed       Past Medical History:  Diagnosis Date  . Anxiety   . Arthritis   . Coronary artery disease   . Diabetes mellitus without complication ([place])   . Diverticulitis   . Dyspnea   . GERD (gastroesophageal reflux disease)   . Hypercholesteremia   . Hypertension   . Pneumonia    as a child  . Prostate cancer ([place])   . UTI (lower urinary tract infection)     Past Surgical History:  Procedure Laterality Date  . ABDOMINAL SURGERY     for diverticulitis; also removed appendix  . CATARACT EXTRACTION, BILATERAL    . CHOLECYSTECTOMY N/A 10/12/2017   Procedure: LAPAROSCOPIC CHOLECYSTECTOMY;  Surgeon: [name], MD;  Location: [place];  Service: General;  Laterality: N/A;  . CORONARY ARTERY BYPASS GRAFT  2009  . EYE SURGERY     "for bleeding in eye"  . HERNIA REPAIR    . LEFT HEART CATH AND CORONARY ANGIOGRAPHY N/A 11/04/2016   Procedure: Left Heart Cath and Coronary Angiography;  Surgeon: [name], MD;  Location: [place] [facility];  Service: Cardiovascular;  Laterality: N/A;  . LOWER EXTREMITY ANGIOGRAPHY N/A 11/04/2016   Procedure: Lower Extremity Angiography;  Surgeon: [name], MD;  Location: [place] [facility];  Service: Cardiovascular;  Laterality: N/A;  .  PROSTATE SURGERY      There were no vitals filed for this visit.  Subjective Assessment - 03/18/18 0846    Subjective  "I am doing pretty good, no pain and I can't remember the last time I had pain over the course of the last few visits"    Pain Score  0-No pain    Aggravating Factors   N/A         [facility] PT Assessment - 03/18/18 0859      Observation/Other Assessments   Focus on Therapeutic Outcomes (FOTO)   28% limited      AROM   Right Hip Extension  -4    Right Hip Flexion  91    Left Hip Extension  -4    Left Hip Flexion  105      Strength   Right Hip Flexion  4-/5 inital 3+/5    Right Hip ABduction  4/5 inital 3+/5    Right Hip ADduction  4/5 inital 4-/5    Left Hip Flexion  4-/5 inital 4-/5    Left Hip ABduction  4+/5 initial 3+/5    Left Hip ADduction  4/5 inital 4-/5                   [facility] Adult PT Treatment/Exercise - 03/18/18 8453  Lumbar Exercises: Stretches   Gastroc Stretch  2 reps;30 seconds Soleus stretch 2 x 30 sec via slant board      Lumbar Exercises: Aerobic   Nustep  L6 x 6 min UE/LE      Lumbar Exercises: Supine   Bridge  10 reps x 2 sets while keeping core tight          Balance Exercises - 03/18/18 0922      Balance Exercises: Standing   Standing Eyes Opened  Narrow base of support (BOS);2 reps;30 secs    Tandem Stance  4 reps;30 secs;Eyes open 2 x with RLE leading and 2 x with LLE leading, mod sway        PT Education - 03/18/18 5176    Education provided  Yes    Education Details  reviewed previously provided HEP. benefits of continued exercise and how to progress to promote endurnace and maintaine current level of function. reviewed progress compared to initial assessment.     Person(s) Educated  Patient    Methods  Explanation;Verbal cues    Comprehension  Verbalized understanding;Verbal cues required       PT Short Term Goals - 03/11/18 0918      PT SHORT TERM GOAL #1   Title  pt to be I with inital HEP     Time  3    Period  Weeks    Status  Achieved      PT SHORT TERM GOAL #2   Title  pt to verbalize and demo proper posture and lifting mechanics to prevent and reduce low back pain     Time  3    Period  Weeks    Status  Achieved        PT Long Term Goals - 03/18/18 0855      PT LONG TERM GOAL #1   Title  pt to increase hip extension and flexion by >/= 8 degrees for functional mobility required for proper gait mechanics    Time  6    Period  Weeks    Status  Partially Met      PT LONG TERM GOAL #2   Title  pt to increase bil hip strength to >/= 4/5 in all planes to promote hip stability and maximize safety     Time  6    Period  Weeks    Status  Partially Met      PT LONG TERM GOAL #3   Title  pt to be able to sit, stand and walk >/= 45 min with LRAD reporting </= 1/10 pain for functional endurance required for ADLs    Baseline  biggest limitation are feet, no problems with back.     Time  6    Period  Weeks    Status  Partially Met      PT LONG TERM GOAL #4   Title  increase FOTO score to </= 32% limited to demo improvement in function    Period  Weeks    Status  Achieved      PT LONG TERM GOAL #5   Title  pt to be I with all HEP given as of last visit to maintain and progress current level of function    Period  Weeks    Status  Achieved            Plan - 03/18/18 1607    Clinical Impression Statement  Mr. Lenn has made great progress with physical therapy  increasing hip mobility, increasing strength and additionally reports no pain in the back or hip. He met or partially met all goals today. he was able to perform all exercises and demonstrated improvement in balance progressing to tandem which demonstated increasing challenge with postural sway. He is able to maintain and progress his current level of function independenlty and will be discharged from Pt today.     PT Next Visit Plan  D/C    PT Home Exercise Plan  hip flexor stretching, clams, bridge,  lower trunk rotation, hamstring stretch,  calf stretch, corner balance. tandem corner balance    Consulted and Agree with Plan of Care  Patient       Patient will benefit from skilled therapeutic intervention in order to improve the following deficits and impairments:  Abnormal gait, Pain, Improper body mechanics, Postural dysfunction, Decreased strength, Decreased activity tolerance, Decreased endurance, Decreased range of motion, Increased fascial restricitons  Visit Diagnosis: Chronic right-sided low back pain, with sciatica presence unspecified  Pain in right hip  Muscle weakness (generalized)  Other abnormalities of gait and mobility     Problem List Patient Active Problem List   Diagnosis Date Noted  . Angina pectoris ([place]) 11/03/2016    [place] 03/18/2018, 9:26 AM  [facility] [address] [place], [place], [address] Phone: [phone]   Fax:  [phone]  Name: Arsal Tappan MRN: [id number] Date of Birth: 20-May-1933     PHYSICAL THERAPY DISCHARGE SUMMARY  Visits from Start of Care: 9  Current functional level related to goals / functional outcomes: See goals, 28% limited   Remaining deficits: Mild weakness in the L hip flexors, functional mobility in hips. Limited endurance with prolonged standing / walking due to issues with feet. See assessement   Education / Equipment: HEP, theraband, posture, balance,   Plan: Patient agrees to discharge.  Patient goals were partially met. Patient is being discharged due to being pleased with the current functional level.  ?????          PT, DPT, LAT, ATC  03/18/18  9:26 AM

## 2018-03-23 ENCOUNTER — Ambulatory Visit: Payer: Medicare PPO | Admitting: Physical Therapy

## 2018-03-25 ENCOUNTER — Encounter: Payer: Medicare PPO | Admitting: Physical Therapy

## 2018-03-25 DIAGNOSIS — I739 Peripheral vascular disease, unspecified: Secondary | ICD-10-CM | POA: Diagnosis not present

## 2018-03-25 DIAGNOSIS — I5032 Chronic diastolic (congestive) heart failure: Secondary | ICD-10-CM | POA: Diagnosis not present

## 2018-03-25 DIAGNOSIS — E1142 Type 2 diabetes mellitus with diabetic polyneuropathy: Secondary | ICD-10-CM | POA: Diagnosis not present

## 2018-03-25 DIAGNOSIS — R0602 Shortness of breath: Secondary | ICD-10-CM | POA: Diagnosis not present

## 2018-03-26 DIAGNOSIS — I129 Hypertensive chronic kidney disease with stage 1 through stage 4 chronic kidney disease, or unspecified chronic kidney disease: Secondary | ICD-10-CM | POA: Diagnosis not present

## 2018-03-26 DIAGNOSIS — N2581 Secondary hyperparathyroidism of renal origin: Secondary | ICD-10-CM | POA: Diagnosis not present

## 2018-03-26 DIAGNOSIS — N183 Chronic kidney disease, stage 3 (moderate): Secondary | ICD-10-CM | POA: Diagnosis not present

## 2018-03-26 DIAGNOSIS — D631 Anemia in chronic kidney disease: Secondary | ICD-10-CM | POA: Diagnosis not present

## 2018-03-28 IMAGING — US US ABDOMEN LIMITED
1 series · 14 of 25 positions shown · non-contrast
Comparison: None.

CLINICAL DATA: Abdominal pain

EXAM:
ULTRASOUND ABDOMEN LIMITED RIGHT UPPER QUADRANT

[Series 1: us abdomen limited · 0.15mm/px · 14 of 41 slices shown]
[im 1/41]
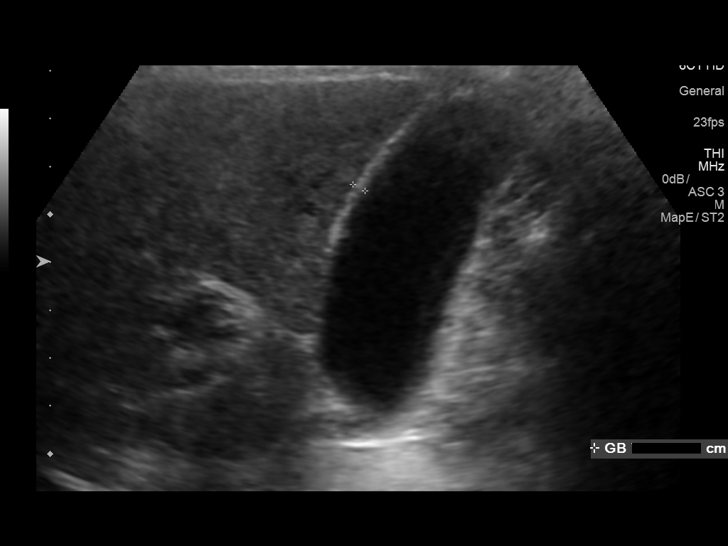
[im 4/41]
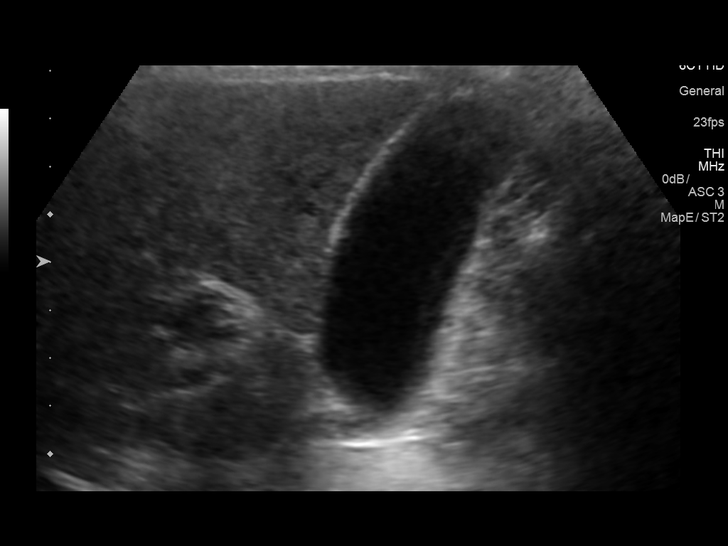
[im 7/41]
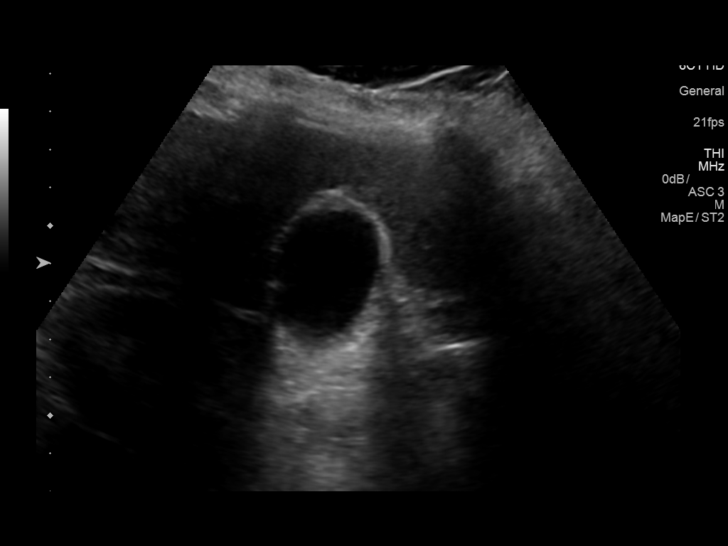
[im 11/41]
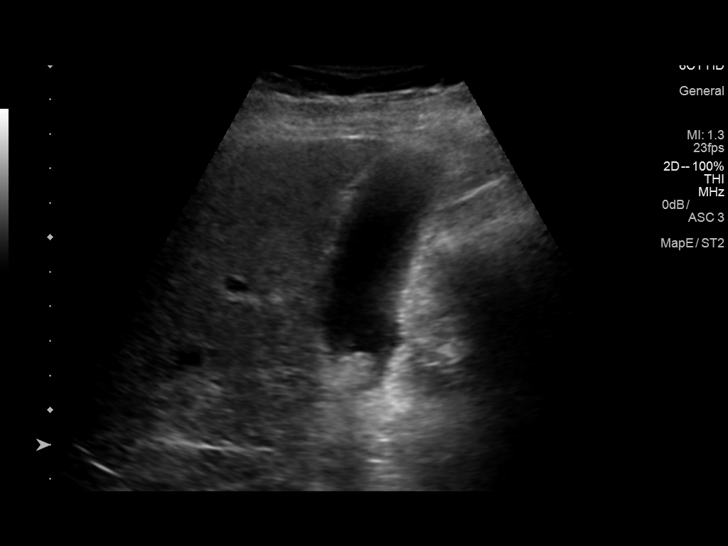
[im 14/41]
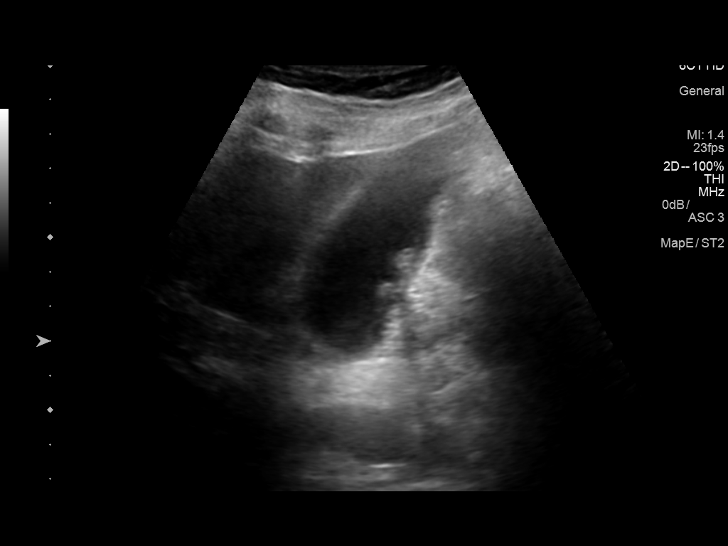
[im 16/41]
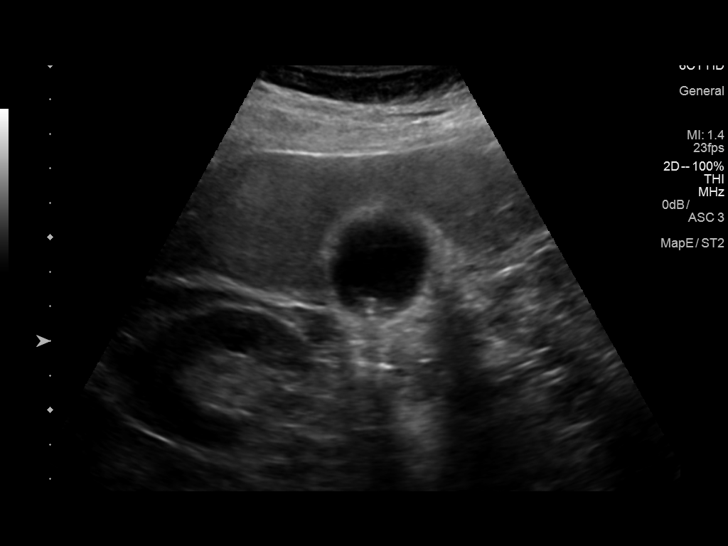
[im 19/41]
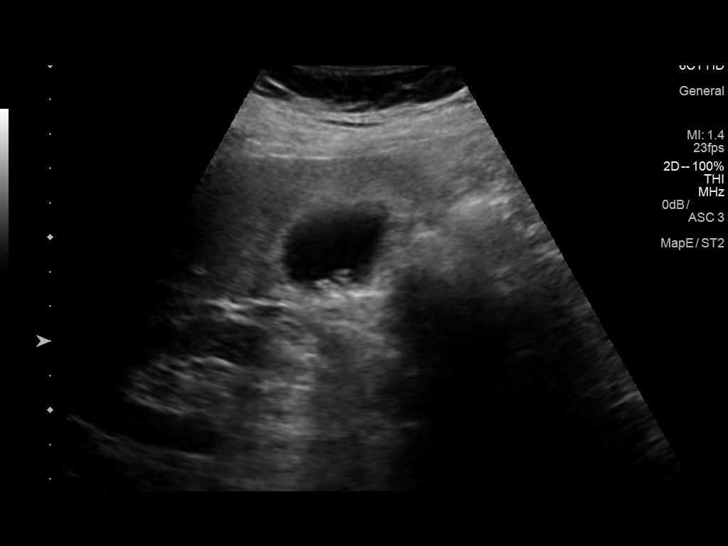
[im 22/41]
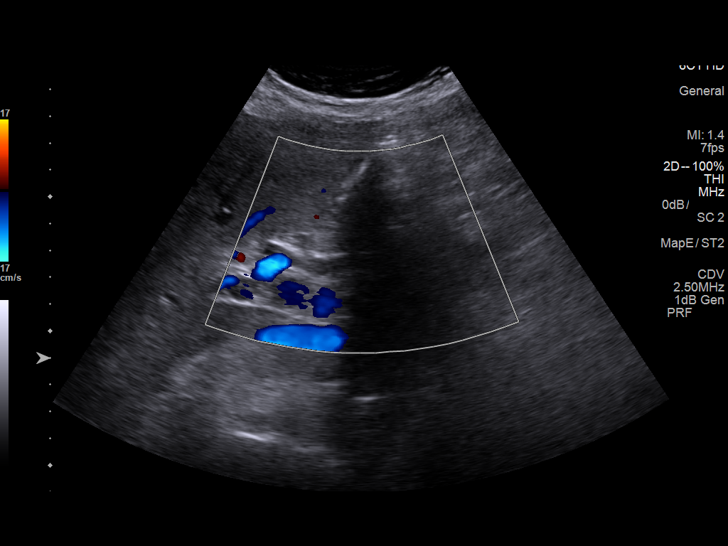
[im 26/41]
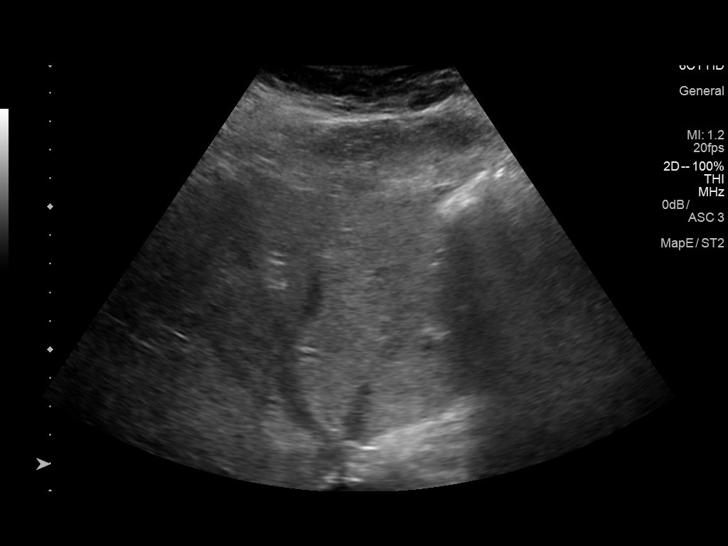
[im 27/41]
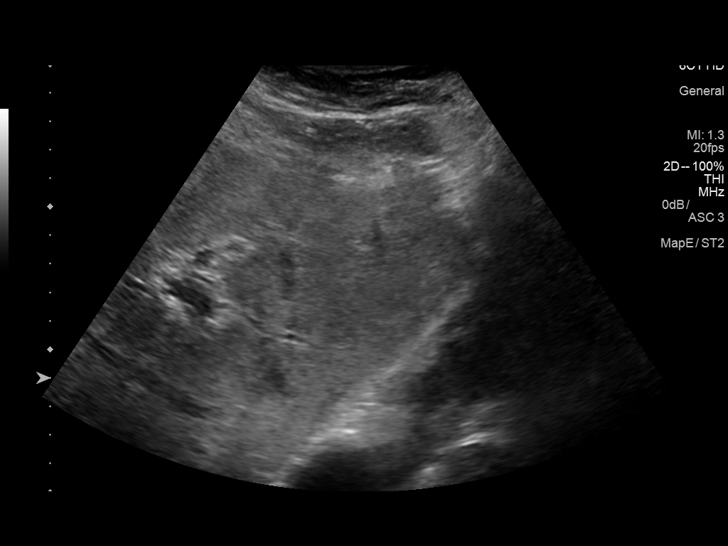
[im 31/41]
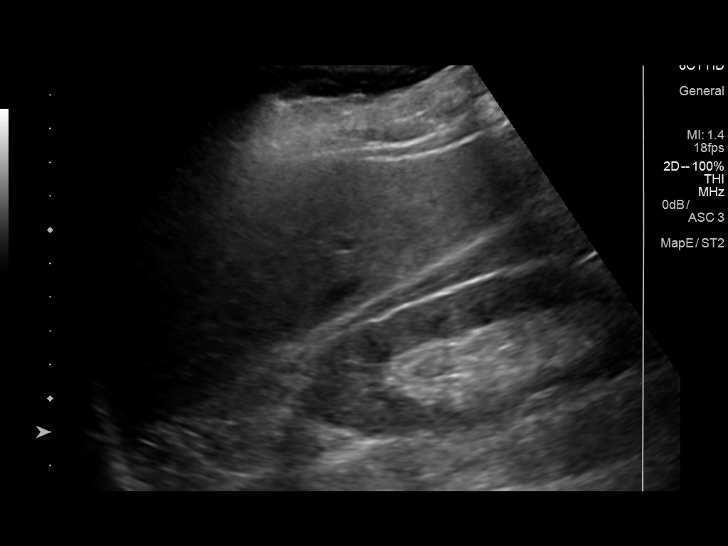
[im 34/41]
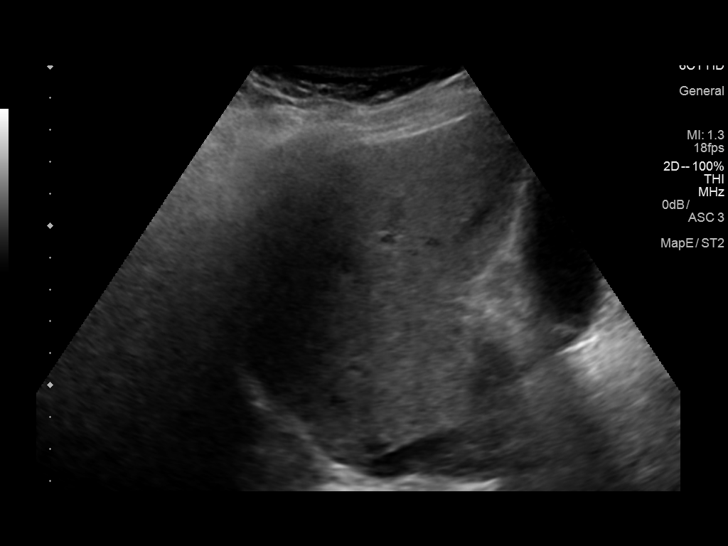
[im 37/41]
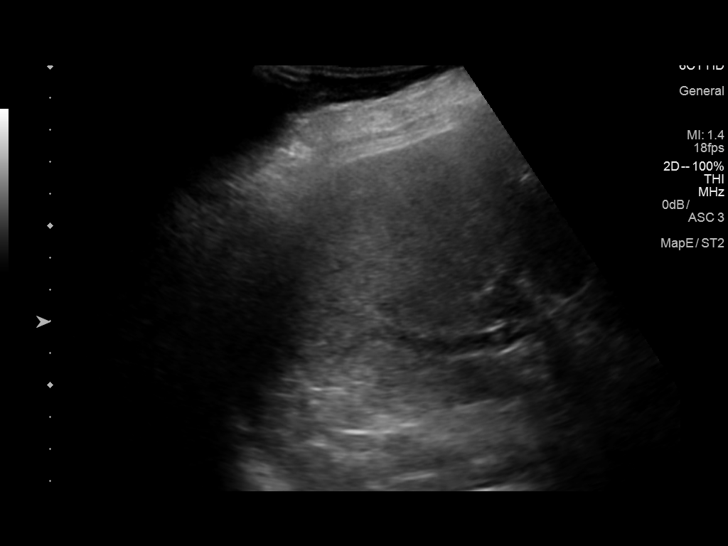
[im 41/41]
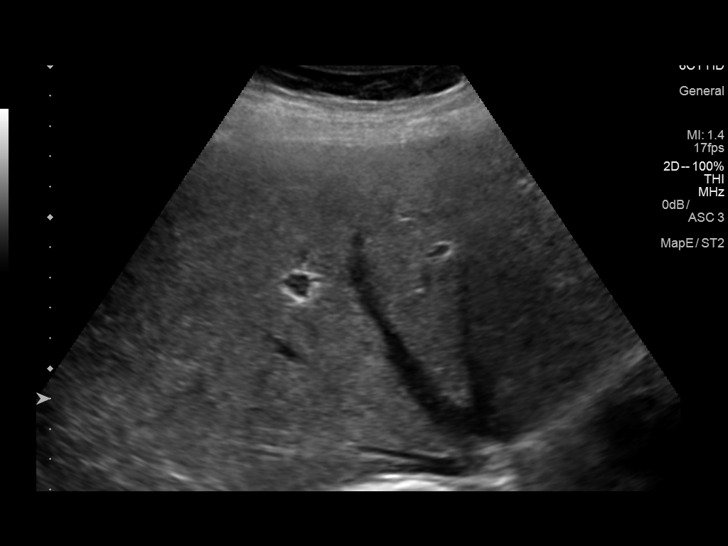

[14 of 25 positions shown; findings below may reference images not displayed]

FINDINGS: Gallbladder:

Multiple gallstones measuring up to 7 mm in diameter. Mild
gallbladder sludge. Negative sonographic Murphy sign. Negative for
gallbladder wall thickening

Common bile duct:

Diameter: 4.4 mm

Liver:

No focal lesion identified. Within normal limits in parenchymal
echogenicity. Portal vein is patent on color Doppler imaging with
normal direction of blood flow towards the liver.
IMPRESSION: Cholelithiasis without evidence of cholecystitis.

## 2018-03-29 NOTE — Progress Notes (Signed)
HEMATOLOGY/ONCOLOGY CONSULTATION NOTE  Date of Service: 03/30/2018  Patient Care Team: [name], MD as PCP - General (Internal Medicine)  CHIEF COMPLAINTS/PURPOSE OF CONSULTATION:   Monoclonal Paraproteinemia  HISTORY OF PRESENTING ILLNESS:   Justin [name] is a wonderful 84 y.o. male who has been referred to [place] by his nephorlogist for evaluation and management of positive M-spike.   Patient has h/o HTN, DM2 (with retinopathy),CKD, CAD s/p quadruple bypass surgery, HLD, anxiety , h/o Prostate cancer s/p Ablation and presents to the clinic today accompanied by his daughter.   He notes he has comorbidities of CKD that is being managed by his nephrologist Dr Elmarie Shiley MD and notes that this is thought to be related to his HTN and DM2. He notes that he has had DM for 35 years. He is on insulin along with his other DM medication. He notes that he has also had long standing HTN.  As part of his CKD workup with his nephrologist he has SPEP and SFLC were done which showed an M spike of 0.7g/dl with normal SFLC K/L ratio. (M spike was 0.5g/dl in 08/2016)   He has a creatinine of 1.52 with normal calcium levels of 9.5 HGB 13.3 Patient reports no focal bone pains.  No acute new fatigue or change in functional status.  He had his gall bladder removed in 09/2017. He has healed well from that.   On review of symptoms, pt noted chronic back pain. He notes he is more short of breath when going to the bathroom or walking short distance. He notes poor circulation and neuropathy in the legs. He notes they feel swollen but do not appear swollen. He notes ongoing abdominal pain. His daughter notes an unexplained rash seen on his hands, scalp and back of neck. This has been present for over a month and makes him itch. He is not sure what this can be associated with. His newest medication is Lyrica for a few months. He denies food allergy.   Interval History:   Justin [name] returns today  regarding his monoclonal paraproteinemia. The patient's last visit with [place] was on 12/01/17. He is accompanied today by his daughter. The pt reports that he is doing well overall.   The pt reports not being limited in his daily life by any new concerns or symptoms. He notes that he does not have any new bone pains and recently finished physical rehabilitation.   Of note since the patient's last visit, pt has had DG Bone survey completed on 12/08/17 with results revealing Negative examination. No lytic lesions. Ordinary degenerative changes of the spine and sacroiliac joints. Ankylosis of the sacroiliac joints. Vascular calcification.  Lab results today 03/30/18 of CBC w/diff, CMP, and Reticulocytes is as follows: all values are WNL except for RDW at 15.7, Glucose at 315, Creatinine at 1.61, Albumin at 3.4, AST at 12, GFR at 38.  24hr Ur UPEP 12/08/17 revealed 234mg  Total Protein/day SPEP 12/01/17 revealed IgM at 14, M Protein at 0.5 Kappa/Lambda 12/01/17 revealed Kappa at 29.2 but the K:L ratio was normal at 1.60  On review of systems, pt reports stable energy levels and denies new bone pains, leg swelling, abdominal pains, and any other symptoms.   MEDICAL HISTORY:  Past Medical History:  Diagnosis Date  . Anxiety   . Arthritis   . Coronary artery disease   . Diabetes mellitus without complication ([place])   . Diverticulitis   . Dyspnea   . GERD (gastroesophageal reflux disease)   .  Hypercholesteremia   . Hypertension   . Pneumonia    as a child  . Prostate cancer ([place])   . UTI (lower urinary tract infection)     SURGICAL HISTORY: Past Surgical History:  Procedure Laterality Date  . ABDOMINAL SURGERY     for diverticulitis; also removed appendix  . CATARACT EXTRACTION, BILATERAL    . CHOLECYSTECTOMY N/A 10/12/2017   Procedure: LAPAROSCOPIC CHOLECYSTECTOMY;  Surgeon: [name], MD;  Location: [facility];  Service: General;  Laterality: N/A;  . CORONARY ARTERY BYPASS GRAFT  2009  . EYE  SURGERY     "for bleeding in eye"  . HERNIA REPAIR    . LEFT HEART CATH AND CORONARY ANGIOGRAPHY N/A 11/04/2016   Procedure: Left Heart Cath and Coronary Angiography;  Surgeon: [name], MD;  Location: [facility];  Service: Cardiovascular;  Laterality: N/A;  . LOWER EXTREMITY ANGIOGRAPHY N/A 11/04/2016   Procedure: Lower Extremity Angiography;  Surgeon: [name], MD;  Location: [facility];  Service: Cardiovascular;  Laterality: N/A;  . PROSTATE SURGERY      SOCIAL HISTORY: Social History   Socioeconomic History  . Marital status: Widowed    Spouse name: Not on file  . Number of children: Not on file  . Years of education: Not on file  . Highest education level: Not on file  Occupational History  . Not on file  Social Needs  . Financial resource strain: Not on file  . Food insecurity:    Worry: Not on file    Inability: Not on file  . Transportation needs:    Medical: Not on file    Non-medical: Not on file  Tobacco Use  . Smoking status: Former Research scientist (life sciences)  . Smokeless tobacco: Never Used  Substance and Sexual Activity  . Alcohol use: No  . Drug use: No  . Sexual activity: Not on file  Lifestyle  . Physical activity:    Days per week: Not on file    Minutes per session: Not on file  . Stress: Not on file  Relationships  . Social connections:    Talks on phone: Not on file    Gets together: Not on file    Attends religious service: Not on file    Active member of club or organization: Not on file    Attends meetings of clubs or organizations: Not on file    Relationship status: Not on file  . Intimate partner violence:    Fear of current or ex partner: Not on file    Emotionally abused: Not on file    Physically abused: Not on file    Forced sexual activity: Not on file  Other Topics Concern  . Not on file  Social History Narrative  . Not on file    FAMILY HISTORY: History reviewed. No pertinent family history.  ALLERGIES:  is allergic to dye fdc  red [red dye] and ibuprofen.  MEDICATIONS:  Current Outpatient Medications  Medication Sig Dispense Refill  . acetaminophen (TYLENOL) 500 MG tablet Take 1,000 mg by mouth every 6 (six) hours as needed for moderate pain or headache.    . AMITIZA 24 MCG capsule Take 24 mcg by mouth daily as needed for constipation.     Marland Kitchen aspirin EC 81 MG tablet Take 81 mg by mouth daily.    . chlorthalidone (HYGROTON) 25 MG tablet Take 25 mg by mouth daily.     . Cholecalciferol (VITAMIN D3 PO) Take 1 capsule by  mouth daily.    . cilostazol (PLETAL) 100 MG tablet Take 100 mg by mouth 2 (two) times daily.    . clonazePAM (KLONOPIN) 0.5 MG tablet Take 0.5 mg by mouth daily as needed for anxiety.    . Cyanocobalamin (VITAMIN B-12 PO) Take 1 tablet by mouth daily.    . isosorbide mononitrate (IMDUR) 60 MG 24 hr tablet Take 60 mg by mouth daily.    Marland Kitchen JANUVIA 50 MG tablet Take 50 mg by mouth 2 (two) times daily.    Marland Kitchen LANTUS SOLOSTAR 100 UNIT/ML Solostar Pen Inject 35 Units into the skin 2 (two) times daily.     . metoprolol (LOPRESSOR) 50 MG tablet Take 50 mg by mouth 2 (two) times daily.    . nitroGLYCERIN (NITROSTAT) 0.4 MG SL tablet Place 0.4 mg under the tongue every 5 (five) minutes as needed for chest pain.     Marland Kitchen omeprazole (PRILOSEC) 20 MG capsule Take 20 mg by mouth daily.    . predniSONE (DELTASONE) 20 MG tablet Take 2 tablets daily with breakfast. 10 tablet 0  . pregabalin (LYRICA) 50 MG capsule Take 50 mg by mouth 3 (three) times daily.    . Pyridoxine HCl (VITAMIN B-6 PO) Take 1 tablet by mouth daily.    . simvastatin (ZOCOR) 20 MG tablet Take 20 mg by mouth every evening.    . tamsulosin (FLOMAX) 0.4 MG CAPS capsule Take 0.4 mg by mouth daily.     No current facility-administered medications for this visit.     REVIEW OF SYSTEMS:    A 10+ POINT REVIEW OF SYSTEMS WAS OBTAINED including neurology, dermatology, psychiatry, cardiac, respiratory, lymph, extremities, GI, GU, Musculoskeletal,  constitutional, breasts, reproductive, HEENT.  All pertinent positives are noted in the HPI.  All others are negative.   PHYSICAL EXAMINATION: ECOG PERFORMANCE STATUS: 2 - Symptomatic, <50% confined to bed  . Vitals:   03/30/18 0955  BP: 121/66  Pulse: (!) 59  Resp: 18  Temp: 98.3 F (36.8 C)  SpO2: 97%   Filed Weights   03/30/18 0955  Weight: 198 lb 6.4 oz (90 kg)   .Body mass index is 31.78 kg/m.  GENERAL:alert, in no acute distress and comfortable SKIN: no acute rashes, no significant lesions EYES: conjunctiva are pink and non-injected, sclera anicteric OROPHARYNX: MMM, no exudates, no oropharyngeal erythema or ulceration NECK: supple, no JVD LYMPH:  no palpable lymphadenopathy in the cervical, axillary or inguinal regions LUNGS: clear to auscultation b/l with normal respiratory effort HEART: regular rate & rhythm ABDOMEN:  normoactive bowel sounds , non tender, not distended. Extremity: 1+ pedal edema PSYCH: alert & oriented x 3 with fluent speech NEURO: no focal motor/sensory deficits  LABORATORY DATA:  I have reviewed the data as listed  . CBC Latest Ref Rng & Units 03/30/2018 12/01/2017 10/08/2017  WBC 4.0 - 10.3 K/uL 8.6 8.3 8.2  Hemoglobin 13.0 - 17.1 g/dL 13.5 13.2 13.7  Hematocrit 38.4 - 49.9 % 40.9 41.1 43.0  Platelets 140 - 400 K/uL 158 155 159  HGB 13.2  . CBC    Component Value Date/Time   WBC 8.6 03/30/2018 0848   WBC 8.2 10/08/2017 0842   RBC 4.83 03/30/2018 0848   RBC 4.83 03/30/2018 0848   HGB 13.5 03/30/2018 0848   HCT 40.9 03/30/2018 0848   PLT 158 03/30/2018 0848   MCV 84.7 03/30/2018 0848   MCH 28.0 03/30/2018 0848   MCHC 33.0 03/30/2018 0848   RDW 15.7 (H) 03/30/2018 0848  LYMPHSABS 1.7 03/30/2018 0848   MONOABS 0.5 03/30/2018 0848   EOSABS 0.3 03/30/2018 0848   BASOSABS 0.0 03/30/2018 0848     . CMP Latest Ref Rng & Units 03/30/2018 12/01/2017 10/08/2017  Glucose 70 - 99 mg/dL 315(H) 235(H) 235(H)  BUN 8 - 23 mg/dL 23 43(H) 26(H)    Creatinine 0.61 - 1.24 mg/dL 1.61(H) 1.71(H) 1.51(H)  Sodium 135 - 145 mmol/L 137 141 136  Potassium 3.5 - 5.1 mmol/L 4.2 4.7 4.8  Chloride 98 - 111 mmol/L 107 113(H) 109  CO2 22 - 32 mmol/L 23 21(L) 22  Calcium 8.9 - 10.3 mg/dL 9.8 9.5 9.4  Total Protein 6.5 - 8.1 g/dL 7.1 7.1 -  Total Bilirubin 0.3 - 1.2 mg/dL 0.4 0.3 -  Alkaline Phos 38 - 126 U/L 91 78 -  AST 15 - 41 U/L 12(L) 14 -  ALT 0 - 44 U/L 11 16 -   . Lab Results  Component Value Date   LDH 150 12/01/2017   IFE 1  Comment VC  Comment: (NOTE)  Immunofixation shows IgG monoclonal protein with kappa light chain  specificity.     OUTSIDE LABS      Component     Latest Ref Rng & Units 12/01/2017 03/30/2018  IgG (Immunoglobin G), Serum     700 - 1,600 mg/dL 1,595 1,341  IgA     61 - 437 mg/dL 160 147  IgM (Immunoglobulin M), Srm     15 - 143 mg/dL 14 (L) 14 (L)  Total Protein ELP     6.0 - 8.5 g/dL 6.5 6.7  Albumin SerPl Elph-Mcnc     2.9 - 4.4 g/dL 3.2 3.5  Alpha 1     0.0 - 0.4 g/dL 0.2 0.2  Alpha2 Glob SerPl Elph-Mcnc     0.4 - 1.0 g/dL 0.7 0.8  B-Globulin SerPl Elph-Mcnc     0.7 - 1.3 g/dL 0.9 1.0  Gamma Glob SerPl Elph-Mcnc     0.4 - 1.8 g/dL 1.4 1.2  M Protein SerPl Elph-Mcnc     Not Observed g/dL 0.5 (H) 0.6 (H)  Globulin, Total     2.2 - 3.9 g/dL 3.3 3.2  Albumin/Glob SerPl     0.7 - 1.7 1.0 1.1  IFE 1      Comment Comment  Please Note (HCV):      Comment Comment  Kappa free light chain     3.3 - 19.4 mg/L 29.2 (H) 26.7 (H)  Lamda free light chains     5.7 - 26.3 mg/L 18.2 17.6  Kappa, lamda light chain ratio     0.26 - 1.65 1.60 1.52     RADIOGRAPHIC STUDIES:  I have personally reviewed the radiological images as listed and agreed with the findings in the report.   Bone survey 12/08/2017: IMPRESSION: Negative examination. No lytic lesions. Ordinary degenerative changes of the spine and sacroiliac joints. Ankylosis of the sacroiliac joints. Vascular calcification.  ASSESSMENT &  PLAN:   Justin [name] is a 84 y.o. African American male with    1. IgG Kappa Monoclonal Paraproteinemia. M spike 0.5g/dl (appears to be non progressive since 2017 when it was also 0.5g/dl and 0.7g/dl in 06/2017) Normal Kappa/Lambda ratio  Normal Hg and Ca level, no new focal bone pain  Has CKD - likely related to HTN and DM2 (long standing) Overall low likelihood of Myeloma.  Likely Represents MGUS (Monoclonal gammopathy of undetermined significance. Nl sed rate and LDH  PLAN:   -Discussed  pt labwork today, 03/30/18; blood counts normal -Discussed the 12/08/17 DG Bone Survey which did not reveal any lytic lesions  -Discussed the 12/01/17 SPEP and 24hr urine study results. SPEP reveled M Protein at 0.5 and urine study did not observe M Protein -Mspike stable on labs today at 0.6g/dl -No anemia, calcium is normal, no observable M Protein in urine, bone survey was negative for bone tumors -Fast-moving plasma cell disorder is not indicated -MGUS is most likely with low likelihood of MM or AL Amyloidosis. -No indication for BM Bx at this time -Will follow up with pt regarding pending labs from today -Will see pt back in 6 months -Continue follow up with PCP and nephrologist    2. CKD -likely related to HTN and DM2 -Mx by Dr Posey Pronto - nephrology -On Ramipril   3. 4. Past Medical History:  Diagnosis Date  . Anxiety   . Arthritis   . Coronary artery disease   . Diabetes mellitus without complication ([place])   . Diverticulitis   . Dyspnea   . GERD (gastroesophageal reflux disease)   . Hypercholesteremia   . Hypertension   . Pneumonia    as a child  . Prostate cancer ([place])   . UTI (lower urinary tract infection)   --diabetic neuropathy. -- previously on Gabapentin, currently on Lyrica.  -Mx by PCP   RTC with Dr Irene Limbo in 6 months Lab 1 week prior to clinic visit     All of the patients questions were answered with apparent satisfaction. The patient knows to call the clinic with  any problems, questions or concerns.  The total time spent in the appt was 15 minutes and more than 50% was on counseling and direct patient cares.    Sullivan Lone MD MS AAHIVMS [facility] Hematology/Oncology Physician [facility]  (Office):       [phone] (Work cell):  [phone] (Fax):           [phone]  03/30/2018 10:27 AM  I, Baldwin [place], am acting as a Education administrator for Dr Irene Limbo.   .I have reviewed the above documentation for accuracy and completeness, and I agree with the above. Brunetta Genera MD

## 2018-03-30 ENCOUNTER — Encounter: Payer: Self-pay | Admitting: Hematology

## 2018-03-30 ENCOUNTER — Inpatient Hospital Stay: Payer: Medicare PPO | Attending: Hematology | Admitting: Hematology

## 2018-03-30 ENCOUNTER — Telehealth: Payer: Self-pay | Admitting: Hematology

## 2018-03-30 ENCOUNTER — Inpatient Hospital Stay: Payer: Medicare PPO

## 2018-03-30 VITALS — BP 121/66 | HR 59 | Temp 98.3°F | Resp 18 | Ht 66.25 in | Wt 198.4 lb

## 2018-03-30 DIAGNOSIS — D472 Monoclonal gammopathy: Secondary | ICD-10-CM

## 2018-03-30 DIAGNOSIS — Z794 Long term (current) use of insulin: Secondary | ICD-10-CM | POA: Insufficient documentation

## 2018-03-30 DIAGNOSIS — Z79899 Other long term (current) drug therapy: Secondary | ICD-10-CM | POA: Diagnosis not present

## 2018-03-30 DIAGNOSIS — I10 Essential (primary) hypertension: Secondary | ICD-10-CM

## 2018-03-30 DIAGNOSIS — Z87891 Personal history of nicotine dependence: Secondary | ICD-10-CM

## 2018-03-30 DIAGNOSIS — N189 Chronic kidney disease, unspecified: Secondary | ICD-10-CM | POA: Insufficient documentation

## 2018-03-30 DIAGNOSIS — I129 Hypertensive chronic kidney disease with stage 1 through stage 4 chronic kidney disease, or unspecified chronic kidney disease: Secondary | ICD-10-CM | POA: Insufficient documentation

## 2018-03-30 DIAGNOSIS — E119 Type 2 diabetes mellitus without complications: Secondary | ICD-10-CM | POA: Diagnosis not present

## 2018-03-30 DIAGNOSIS — I2510 Atherosclerotic heart disease of native coronary artery without angina pectoris: Secondary | ICD-10-CM | POA: Diagnosis not present

## 2018-03-30 LAB — CBC WITH DIFFERENTIAL (CANCER CENTER ONLY)
Basophils Absolute: 0 10*3/uL (ref 0.0–0.1)
Basophils Relative: 0 %
Eosinophils Absolute: 0.3 10*3/uL (ref 0.0–0.5)
Eosinophils Relative: 4 %
HCT: 40.9 % (ref 38.4–49.9)
Hemoglobin: 13.5 g/dL (ref 13.0–17.1)
Lymphocytes Relative: 20 %
Lymphs Abs: 1.7 10*3/uL (ref 0.9–3.3)
MCH: 28 pg (ref 27.2–33.4)
MCHC: 33 g/dL (ref 32.0–36.0)
MCV: 84.7 fL (ref 79.3–98.0)
Monocytes Absolute: 0.5 10*3/uL (ref 0.1–0.9)
Monocytes Relative: 6 %
Neutro Abs: 6 10*3/uL (ref 1.5–6.5)
Neutrophils Relative %: 70 %
Platelet Count: 158 10*3/uL (ref 140–400)
RBC: 4.83 MIL/uL (ref 4.20–5.82)
RDW: 15.7 % — ABNORMAL HIGH (ref 11.0–14.6)
WBC Count: 8.6 10*3/uL (ref 4.0–10.3)

## 2018-03-30 LAB — CMP (CANCER CENTER ONLY)
ALT: 11 U/L (ref 0–44)
AST: 12 U/L — ABNORMAL LOW (ref 15–41)
Albumin: 3.4 g/dL — ABNORMAL LOW (ref 3.5–5.0)
Alkaline Phosphatase: 91 U/L (ref 38–126)
Anion gap: 7 (ref 5–15)
BUN: 23 mg/dL (ref 8–23)
CO2: 23 mmol/L (ref 22–32)
Calcium: 9.8 mg/dL (ref 8.9–10.3)
Chloride: 107 mmol/L (ref 98–111)
Creatinine: 1.61 mg/dL — ABNORMAL HIGH (ref 0.61–1.24)
GFR, Est AFR Am: 44 mL/min — ABNORMAL LOW (ref >60)
GFR, Estimated: 38 mL/min — ABNORMAL LOW (ref >60)
Glucose, Bld: 315 mg/dL — ABNORMAL HIGH (ref 70–99)
Potassium: 4.2 mmol/L (ref 3.5–5.1)
Sodium: 137 mmol/L (ref 135–145)
Total Bilirubin: 0.4 mg/dL (ref 0.3–1.2)
Total Protein: 7.1 g/dL (ref 6.5–8.1)

## 2018-03-30 LAB — RETICULOCYTES
RBC.: 4.83 MIL/uL (ref 4.20–5.82)
Retic Count, Absolute: 101.4 10*3/uL — ABNORMAL HIGH (ref 34.8–93.9)
Retic Ct Pct: 2.1 % — ABNORMAL HIGH (ref 0.8–1.8)

## 2018-03-30 NOTE — Telephone Encounter (Signed)
Scheduled appt per 7/2 los - gave patient aVS and calender per los.  

## 2018-03-31 LAB — KAPPA/LAMBDA LIGHT CHAINS
Kappa free light chain: 26.7 mg/L — ABNORMAL HIGH (ref 3.3–19.4)
Kappa, lambda light chain ratio: 1.52 (ref 0.26–1.65)
Lambda free light chains: 17.6 mg/L (ref 5.7–26.3)

## 2018-04-01 LAB — MULTIPLE MYELOMA PANEL, SERUM
Albumin SerPl Elph-Mcnc: 3.5 g/dL (ref 2.9–4.4)
Albumin/Glob SerPl: 1.1 (ref 0.7–1.7)
Alpha 1: 0.2 g/dL (ref 0.0–0.4)
Alpha2 Glob SerPl Elph-Mcnc: 0.8 g/dL (ref 0.4–1.0)
B-Globulin SerPl Elph-Mcnc: 1 g/dL (ref 0.7–1.3)
Gamma Glob SerPl Elph-Mcnc: 1.2 g/dL (ref 0.4–1.8)
Globulin, Total: 3.2 g/dL (ref 2.2–3.9)
IgA: 147 mg/dL (ref 61–437)
IgG (Immunoglobin G), Serum: 1341 mg/dL (ref 700–1600)
IgM (Immunoglobulin M), Srm: 14 mg/dL — ABNORMAL LOW (ref 15–143)
M Protein SerPl Elph-Mcnc: 0.6 g/dL — ABNORMAL HIGH
Total Protein ELP: 6.7 g/dL (ref 6.0–8.5)

## 2018-04-06 DIAGNOSIS — E118 Type 2 diabetes mellitus with unspecified complications: Secondary | ICD-10-CM | POA: Diagnosis not present

## 2018-04-06 DIAGNOSIS — I10 Essential (primary) hypertension: Secondary | ICD-10-CM | POA: Diagnosis not present

## 2018-04-06 DIAGNOSIS — E785 Hyperlipidemia, unspecified: Secondary | ICD-10-CM | POA: Diagnosis not present

## 2018-04-13 ENCOUNTER — Encounter (HOSPITAL_COMMUNITY)
Admission: RE | Admit: 2018-04-13 | Discharge: 2018-04-13 | Disposition: A | Discharge status: Home / Self Care | Payer: Medicare PPO | Source: Ambulatory Visit | Attending: Cardiology | Admitting: Cardiology

## 2018-04-13 DIAGNOSIS — I509 Heart failure, unspecified: Secondary | ICD-10-CM

## 2018-04-13 DIAGNOSIS — I5032 Chronic diastolic (congestive) heart failure: Secondary | ICD-10-CM | POA: Diagnosis not present

## 2018-04-15 NOTE — Progress Notes (Signed)
Pulmonary Individual Treatment Plan  Patient Details  Name: Justin [name] MRN: [id number] Date of Birth: 21-Apr-1933 Referring Provider:     Pulmonary Rehab Walk Test from 04/13/2018 in [place]  Referring Provider  Dr. Einar Gip      Initial Encounter Date:    Pulmonary Rehab Walk Test from 04/13/2018 in [place]  Date  04/13/18      Visit Diagnosis: Congestive heart failure, unspecified HF chronicity, unspecified heart failure type ([place])  Patient's Home Medications on Admission:   Current Outpatient Medications:  .  acetaminophen (TYLENOL) 500 MG tablet, Take 1,000 mg by mouth every 6 (six) hours as needed for moderate pain or headache., Disp: , Rfl:  .  AMITIZA 24 MCG capsule, Take 24 mcg by mouth daily as needed for constipation. , Disp: , Rfl:  .  aspirin EC 81 MG tablet, Take 81 mg by mouth daily., Disp: , Rfl:  .  chlorthalidone (HYGROTON) 25 MG tablet, Take 25 mg by mouth daily. , Disp: , Rfl:  .  Cholecalciferol (VITAMIN D3 PO), Take 1 capsule by mouth daily., Disp: , Rfl:  .  cilostazol (PLETAL) 100 MG tablet, Take 100 mg by mouth 2 (two) times daily., Disp: , Rfl:  .  clonazePAM (KLONOPIN) 0.5 MG tablet, Take 0.5 mg by mouth daily as needed for anxiety., Disp: , Rfl:  .  Cyanocobalamin (VITAMIN B-12 PO), Take 1 tablet by mouth daily., Disp: , Rfl:  .  isosorbide mononitrate (IMDUR) 60 MG 24 hr tablet, Take 60 mg by mouth daily., Disp: , Rfl:  .  JANUVIA 50 MG tablet, Take 50 mg by mouth 2 (two) times daily., Disp: , Rfl:  .  LANTUS SOLOSTAR 100 UNIT/ML Solostar Pen, Inject 35 Units into the skin 2 (two) times daily. , Disp: , Rfl:  .  metoprolol (LOPRESSOR) 50 MG tablet, Take 50 mg by mouth 2 (two) times daily., Disp: , Rfl:  .  nitroGLYCERIN (NITROSTAT) 0.4 MG SL tablet, Place 0.4 mg under the tongue every 5 (five) minutes as needed for chest pain. , Disp: , Rfl:  .  omeprazole (PRILOSEC) 20 MG capsule, Take 20  mg by mouth daily., Disp: , Rfl:  .  predniSONE (DELTASONE) 20 MG tablet, Take 2 tablets daily with breakfast., Disp: 10 tablet, Rfl: 0 .  pregabalin (LYRICA) 50 MG capsule, Take 50 mg by mouth 3 (three) times daily., Disp: , Rfl:  .  Pyridoxine HCl (VITAMIN B-6 PO), Take 1 tablet by mouth daily., Disp: , Rfl:  .  simvastatin (ZOCOR) 20 MG tablet, Take 20 mg by mouth every evening., Disp: , Rfl:  .  tamsulosin (FLOMAX) 0.4 MG CAPS capsule, Take 0.4 mg by mouth daily., Disp: , Rfl:   Past Medical History: Past Medical History:  Diagnosis Date  . Anxiety   . Arthritis   . Coronary artery disease   . Diabetes mellitus without complication ([place])   . Diverticulitis   . Dyspnea   . GERD (gastroesophageal reflux disease)   . Hypercholesteremia   . Hypertension   . Pneumonia    as a child  . Prostate cancer ([place])   . UTI (lower urinary tract infection)     Tobacco Use: Social History   Tobacco Use  Smoking Status Former Smoker  Smokeless Tobacco Never Used    Labs: Recent Review [facility] for [facility] Latest Ref Rng & Units 03/10/2016 10/08/2017  Hemoglobin A1c 4.8 - 5.6 % - 8.0(H)   TCO2 0 - 100 mmol/L 22 -      Capillary Blood Glucose: Lab Results  Component Value Date   GLUCAP 144 (H) 10/12/2017   GLUCAP 132 (H) 10/12/2017   GLUCAP 229 (H) 10/08/2017   GLUCAP 175 (H) 11/04/2016   GLUCAP 186 (H) 11/04/2016     Pulmonary Assessment Scores: Pulmonary Assessment Scores    Row Name 04/15/18 0812         ADL UCSD   ADL Phase  Entry       mMRC Score   mMRC Score  1        Pulmonary Function Assessment: Pulmonary Function Assessment - 01/29/18 1139      Breath   Bilateral Breath Sounds  Clear    Shortness of Breath  Yes;Limiting activity       Exercise Target Goals: Date: 04/13/18  Exercise Program Goal: Individual exercise prescription set using results from initial 6 min walk test and THRR while considering   patient's activity barriers and safety.    Exercise Prescription Goal: Initial exercise prescription builds to 30-45 minutes a day of aerobic activity, 2-3 days per week.  Home exercise guidelines will be given to patient during program as part of exercise prescription that the participant will acknowledge.  Activity Barriers & Risk Stratification: Activity Barriers & Cardiac Risk Stratification - 01/29/18 1115      Activity Barriers & Cardiac Risk Stratification   Activity Barriers  History of Falls;Balance Concerns;Assistive Device;Decreased Ventricular Function;Deconditioning;Other (comment)       6 Minute Walk: 6 Minute Walk    Row Name 04/15/18 0812         6 Minute Walk   Phase  Initial     Distance  900 feet     Walk Time  6 minutes     # of Rest Breaks  0     MPH  1.7     METS  2.3     RPE  14     Perceived Dyspnea   3     Symptoms  Yes (comment)     Comments  leg pain 9/10     Resting HR  67 bpm     Resting BP  138/60     Resting Oxygen Saturation   95 %     Exercise Oxygen Saturation  during 6 min walk  95 %     Max Ex. HR  80 bpm     Max Ex. BP  166/64       Interval HR   1 Minute HR  74     2 Minute HR  80     3 Minute HR  80     4 Minute HR  76     5 Minute HR  77     6 Minute HR  78     2 Minute Post HR  71     Interval Heart Rate?  Yes       Interval Oxygen   Interval Oxygen?  Yes     Baseline Oxygen Saturation %  95 %     1 Minute Oxygen Saturation %  95 %     1 Minute Liters of Oxygen  0 L     2 Minute Oxygen Saturation %  96 %     2 Minute Liters of Oxygen  0 L     3 Minute Oxygen Saturation %  96 %  3 Minute Liters of Oxygen  0 L     4 Minute Oxygen Saturation %  96 %     4 Minute Liters of Oxygen  0 L     5 Minute Oxygen Saturation %  96 %     5 Minute Liters of Oxygen  0 L     6 Minute Oxygen Saturation %  96 %     6 Minute Liters of Oxygen  0 L     2 Minute Post Oxygen Saturation %  97 %     2 Minute Post Liters of Oxygen  0 L         Oxygen Initial Assessment: Oxygen Initial Assessment - 04/15/18 0811      Initial 6 min Walk   Oxygen Used  None      Program Oxygen Prescription   Program Oxygen Prescription  None       Oxygen Re-Evaluation:   Oxygen Discharge (Final Oxygen Re-Evaluation):   Initial Exercise Prescription: Initial Exercise Prescription - 04/15/18 0800      Date of Initial Exercise RX and Referring Provider   Date  04/13/18    Referring Provider  Dr. Einar Gip      Recumbant Bike   Level  1    Watts  10    Minutes  17      NuStep   Level  2    SPM  80    Minutes  17    METs  1.5      Track   Laps  5    Minutes  17      Prescription Details   Frequency (times per week)  2    Duration  Progress to 45 minutes of aerobic exercise without signs/symptoms of physical distress      Intensity   THRR 40-80% of Max Heartrate  55-110    Ratings of Perceived Exertion  11-13    Perceived Dyspnea  0-4      Progression   Progression  Continue progressive overload as per policy without signs/symptoms or physical distress.      Resistance Training   Training Prescription  Yes    Weight  blue bands    Reps  10-15       Perform Capillary Blood Glucose checks as needed.  Exercise Prescription Changes:   Exercise Comments:   Exercise Goals and Review: Exercise Goals    Row Name 01/29/18 1115             Exercise Goals   Increase Physical Activity  Yes       Intervention  Provide advice, education, support and counseling about physical activity/exercise needs.;Develop an individualized exercise prescription for aerobic and resistive training based on initial evaluation findings, risk stratification, comorbidities and participant's personal goals.       Expected Outcomes  Short Term: Attend rehab on a regular basis to increase amount of physical activity.;Long Term: Add in home exercise to make exercise part of routine and to increase amount of physical activity.;Long Term:  Exercising regularly at least 3-5 days a week.       Increase Strength and Stamina  Yes       Intervention  Provide advice, education, support and counseling about physical activity/exercise needs.;Develop an individualized exercise prescription for aerobic and resistive training based on initial evaluation findings, risk stratification, comorbidities and participant's personal goals.       Expected Outcomes  Short Term: Increase workloads from initial exercise prescription  for resistance, speed, and METs.;Short Term: Perform resistance training exercises routinely during rehab and add in resistance training at home;Long Term: Improve cardiorespiratory fitness, muscular endurance and strength as measured by increased METs and functional capacity (6MWT)       Able to understand and use rate of perceived exertion (RPE) scale  Yes       Intervention  Provide education and explanation on how to use RPE scale       Expected Outcomes  Short Term: Able to use RPE daily in rehab to express subjective intensity level;Long Term:  Able to use RPE to guide intensity level when exercising independently       Able to understand and use Dyspnea scale  Yes       Intervention  Provide education and explanation on how to use Dyspnea scale       Expected Outcomes  Short Term: Able to use Dyspnea scale daily in rehab to express subjective sense of shortness of breath during exertion;Long Term: Able to use Dyspnea scale to guide intensity level when exercising independently       Knowledge and understanding of Target Heart Rate Range (THRR)  Yes       Intervention  Provide education and explanation of THRR including how the numbers were predicted and where they are located for reference       Expected Outcomes  Short Term: Able to state/look up THRR;Short Term: Able to use daily as guideline for intensity in rehab;Long Term: Able to use THRR to govern intensity when exercising independently       Understanding of Exercise  Prescription  Yes       Intervention  Provide education, explanation, and written materials on patient's individual exercise prescription       Expected Outcomes  Short Term: Able to explain program exercise prescription;Long Term: Able to explain home exercise prescription to exercise independently          Exercise Goals Re-Evaluation :   Discharge Exercise Prescription (Final Exercise Prescription Changes):   Nutrition:  Target Goals: Understanding of nutrition guidelines, daily intake of sodium 1500mg , cholesterol 200mg , calories 30% from fat and 7% or less from saturated fats, daily to have 5 or more servings of fruits and vegetables.  Biometrics:    Nutrition Therapy Plan and Nutrition Goals:   Nutrition Assessments:   Nutrition Goals Re-Evaluation:   Nutrition Goals Discharge (Final Nutrition Goals Re-Evaluation):   Psychosocial: Target Goals: Acknowledge presence or absence of significant depression and/or stress, maximize coping skills, provide positive support system. Participant is able to verbalize types and ability to use techniques and skills needed for reducing stress and depression.  Initial Review & Psychosocial Screening: Initial Psych Review & Screening - 01/29/18 1141      Initial Review   Current issues with  None Identified      Family Dynamics   Good Support System?  Yes      Barriers   Psychosocial barriers to participate in program  There are no identifiable barriers or psychosocial needs.      Screening Interventions   Interventions  Encouraged to exercise       Quality of Life Scores:  Scores of 19 and below usually indicate a poorer quality of life in these areas.  A difference of  2-3 points is a clinically meaningful difference.  A difference of 2-3 points in the total score of the Quality of Life Index has been associated with significant improvement in overall quality of  life, self-image, physical symptoms, and general health in  studies assessing change in quality of life.   PHQ-9: Recent Review Flowsheet Data    Depression screen [facility] 2/9 01/29/2018   Decreased Interest 0   Down, Depressed, Hopeless 0   PHQ - 2 Score 0     Interpretation of Total Score  Total Score Depression Severity:  1-4 = Minimal depression, 5-9 = Mild depression, 10-14 = Moderate depression, 15-19 = Moderately severe depression, 20-27 = Severe depression   Psychosocial Evaluation and Intervention: Psychosocial Evaluation - 01/29/18 1141      Psychosocial Evaluation & Interventions   Interventions  Encouraged to exercise with the program and follow exercise prescription    Expected Outcomes  patient will remain free from psychosocial barriers to participation in pulmonary rehab    Continue Psychosocial Services   Follow up required by staff       Psychosocial Re-Evaluation:   Psychosocial Discharge (Final Psychosocial Re-Evaluation):   Education: Education Goals: Education classes will be provided on a weekly basis, covering required topics. Participant will state understanding/return demonstration of topics presented.  Learning Barriers/Preferences: Learning Barriers/Preferences - 01/29/18 1139      Learning Barriers/Preferences   Learning Barriers  None    Learning Preferences  Written Material;Verbal Instruction;Individual Instruction       Education Topics: Risk Factor Reduction:  -Group instruction that is supported by a PowerPoint presentation. Instructor discusses the definition of a risk factor, different risk factors for pulmonary disease, and how the heart and lungs work together.     Nutrition for Pulmonary Patient:  -Group instruction provided by PowerPoint slides, verbal discussion, and written materials to support subject matter. The instructor gives an explanation and review of healthy diet recommendations, which includes a discussion on weight management, recommendations for fruit and vegetable consumption,  as well as protein, fluid, caffeine, fiber, sodium, sugar, and alcohol. Tips for eating when patients are short of breath are discussed.   Pursed Lip Breathing:  -Group instruction that is supported by demonstration and informational handouts. Instructor discusses the benefits of pursed lip and diaphragmatic breathing and detailed demonstration on how to preform both.     Oxygen Safety:  -Group instruction provided by PowerPoint, verbal discussion, and written material to support subject matter. There is an overview of "What is Oxygen" and "Why do we need it".  Instructor also reviews how to create a safe environment for oxygen use, the importance of using oxygen as prescribed, and the risks of noncompliance. There is a brief discussion on traveling with oxygen and resources the patient may utilize.   Oxygen Equipment:  -Group instruction provided by [facility] Staff utilizing handouts, written materials, and equipment demonstrations.   Signs and Symptoms:  -Group instruction provided by written material and verbal discussion to support subject matter. Warning signs and symptoms of infection, stroke, and heart attack are reviewed and when to call the physician/911 reinforced. Tips for preventing the spread of infection discussed.   Advanced Directives:  -Group instruction provided by verbal instruction and written material to support subject matter. Instructor reviews Advanced Directive laws and proper instruction for filling out document.   Pulmonary Video:  -Group video education that reviews the importance of medication and oxygen compliance, exercise, good nutrition, pulmonary hygiene, and pursed lip and diaphragmatic breathing for the pulmonary patient.   Exercise for the Pulmonary Patient:  -Group instruction that is supported by a PowerPoint presentation. Instructor discusses benefits of exercise, core components of exercise, frequency, duration, and intensity  of an exercise  routine, importance of utilizing pulse oximetry during exercise, safety while exercising, and options of places to exercise outside of rehab.     Pulmonary Medications:  -Verbally interactive group education provided by instructor with focus on inhaled medications and proper administration.   Anatomy and Physiology of the Respiratory System and Intimacy:  -Group instruction provided by PowerPoint, verbal discussion, and written material to support subject matter. Instructor reviews respiratory cycle and anatomical components of the respiratory system and their functions. Instructor also reviews differences in obstructive and restrictive respiratory diseases with examples of each. Intimacy, Sex, and Sexuality differences are reviewed with a discussion on how relationships can change when diagnosed with pulmonary disease. Common sexual concerns are reviewed.   MD DAY -A group question and answer session with a medical doctor that allows participants to ask questions that relate to their pulmonary disease state.   OTHER EDUCATION -Group or individual verbal, written, or video instructions that support the educational goals of the pulmonary rehab program.   Holiday Eating Survival Tips:  -Group instruction provided by PowerPoint slides, verbal discussion, and written materials to support subject matter. The instructor gives patients tips, tricks, and techniques to help them not only survive but enjoy the holidays despite the onslaught of food that accompanies the holidays.   Knowledge Questionnaire Score:   Core Components/Risk Factors/Patient Goals at Admission: Personal Goals and Risk Factors at Admission - 01/29/18 1140      Core Components/Risk Factors/Patient Goals on Admission   Improve shortness of breath with ADL's  Yes    Intervention  Provide education, individualized exercise plan and daily activity instruction to help decrease symptoms of SOB with activities of daily living.     Expected Outcomes  Short Term: Improve cardiorespiratory fitness to achieve a reduction of symptoms when performing ADLs;Long Term: Be able to perform more ADLs without symptoms or delay the onset of symptoms    Diabetes  Yes    Intervention  Provide education about signs/symptoms and action to take for hypo/hyperglycemia.;Provide education about proper nutrition, including hydration, and aerobic/resistive exercise prescription along with prescribed medications to achieve blood glucose in normal ranges: Fasting glucose 65-99 mg/dL    Expected Outcomes  Short Term: Participant verbalizes understanding of the signs/symptoms and immediate care of hyper/hypoglycemia, proper foot care and importance of medication, aerobic/resistive exercise and nutrition plan for blood glucose control.;Long Term: Attainment of HbA1C < 7%.    Heart Failure  Yes    Intervention  Provide a combined exercise and nutrition program that is supplemented with education, support and counseling about heart failure. Directed toward relieving symptoms such as shortness of breath, decreased exercise tolerance, and extremity edema.    Expected Outcomes  Improve functional capacity of life;Short term: Attendance in program 2-3 days a week with increased exercise capacity. Reported lower sodium intake. Reported increased fruit and vegetable intake. Reports medication compliance.;Short term: Daily weights obtained and reported for increase. Utilizing diuretic protocols set by physician.;Long term: Adoption of self-care skills and reduction of barriers for early signs and symptoms recognition and intervention leading to self-care maintenance.       Core Components/Risk Factors/Patient Goals Review:    Core Components/Risk Factors/Patient Goals at Discharge (Final Review):    ITP Comments:   Comments:

## 2018-04-20 ENCOUNTER — Encounter (HOSPITAL_COMMUNITY)
Admission: RE | Admit: 2018-04-20 | Discharge: 2018-04-20 | Disposition: A | Discharge status: Home / Self Care | Payer: Medicare PPO | Source: Ambulatory Visit | Attending: Cardiology | Admitting: Cardiology

## 2018-04-20 VITALS — Wt 199.1 lb

## 2018-04-20 DIAGNOSIS — I5032 Chronic diastolic (congestive) heart failure: Secondary | ICD-10-CM | POA: Diagnosis not present

## 2018-04-20 DIAGNOSIS — I509 Heart failure, unspecified: Secondary | ICD-10-CM

## 2018-04-20 LAB — GLUCOSE, CAPILLARY
Glucose-Capillary: 184 mg/dL — ABNORMAL HIGH (ref 70–99)
Glucose-Capillary: 161 mg/dL — ABNORMAL HIGH (ref 70–99)

## 2018-04-20 NOTE — Progress Notes (Signed)
Daily Session Note  Patient Details  Name: Justin [name] MRN: [id number] Date of Birth: 1933-09-22 Referring Provider:     Pulmonary Rehab Walk Test from 04/13/2018 in [place]  Referring Provider  Dr. Einar Gip      Encounter Date: 04/20/2018  Check In: Session Check In - 04/20/18 1030      Check-In   Location  [facility]    Staff Present  Rosebud Poles, RN, BSN;Carlette Carlton, RN, [facility], MS, ACSM RCEP, Exercise Physiologist;Lisa Ysidro Evert, Felipe Drone, RN, [facility]    Supervising physician immediately available to respond to emergencies  [facility] immediately available    Physician(s)  Dr. Denton Brick    Medication changes reported      No    Fall or balance concerns reported     No    Tobacco Cessation  No Change    Warm-up and Cool-down  Performed as group-led instruction    Resistance Training Performed  Yes    VAD Patient?  No    PAD/SET Patient?  No      Pain Assessment   Currently in Pain?  No/denies    Multiple Pain Sites  No       Capillary Blood Glucose: Results for orders placed or performed during the hospital encounter of 04/20/18 (from the past 24 hour(s))  Glucose, capillary     Status: Abnormal   Collection Time: 04/20/18 10:47 AM  Result Value Ref Range   Glucose-Capillary 184 (H) 70 - 99 mg/dL  Glucose, capillary     Status: Abnormal   Collection Time: 04/20/18 12:12 PM  Result Value Ref Range   Glucose-Capillary 161 (H) 70 - 99 mg/dL   POCT Glucose - 04/20/18 1259      POCT Blood Glucose   Pre-Exercise  184 mg/dL    Post-Exercise  161 mg/dL      Exercise Prescription Changes - 04/20/18 1200      Response to Exercise   Blood Pressure (Admit)  124/58    Blood Pressure (Exercise)  124/60    Blood Pressure (Exit)  110/60    Heart Rate (Admit)  65 bpm    Heart Rate (Exercise)  75 bpm    Heart Rate (Exit)  65 bpm    Oxygen Saturation (Admit)  97 %    Oxygen Saturation  (Exercise)  96 %    Oxygen Saturation (Exit)  97 %    Rating of Perceived Exertion (Exercise)  15    Perceived Dyspnea (Exercise)  1    Duration  Progress to 45 minutes of aerobic exercise without signs/symptoms of physical distress    Intensity  -- 40-80% HRR      Progression   Progression  Continue to progress workloads to maintain intensity without signs/symptoms of physical distress.      Resistance Training   Training Prescription  Yes    Weight  blue bands    Reps  10-15    Time  10 Minutes      Interval Training   Interval Training  No      Recumbant Bike   Level  1    Watts  10    Minutes  17      NuStep   Level  2    SPM  80    Minutes  17    METs  1.9      Track   Laps  10    Minutes  17  Social History   Tobacco Use  Smoking Status Former Smoker  Smokeless Tobacco Never Used    Goals Met:  Exercise tolerated well Strength training completed today  Goals Unmet:  Not Applicable  Comments: Service time is from 1030 to 1215.    Dr. Rush Farmer is Medical Director for Pulmonary Rehab at [facility].

## 2018-04-20 NOTE — Progress Notes (Signed)
Pulmonary Individual Treatment Plan  Patient Details  Name: Justin [name] MRN: [id number] Date of Birth: 1932-10-20 Referring Provider:     Pulmonary Rehab Walk Test from 04/13/2018 in [place]  Referring Provider  Dr. Einar Gip      Initial Encounter Date:    Pulmonary Rehab Walk Test from 04/13/2018 in [place]  Date  04/13/18      Visit Diagnosis: Congestive heart failure, unspecified HF chronicity, unspecified heart failure type ([place])  Patient's Home Medications on Admission:   Current Outpatient Medications:  .  acetaminophen (TYLENOL) 500 MG tablet, Take 1,000 mg by mouth every 6 (six) hours as needed for moderate pain or headache., Disp: , Rfl:  .  AMITIZA 24 MCG capsule, Take 24 mcg by mouth daily as needed for constipation. , Disp: , Rfl:  .  aspirin EC 81 MG tablet, Take 81 mg by mouth daily., Disp: , Rfl:  .  chlorthalidone (HYGROTON) 25 MG tablet, Take 25 mg by mouth daily. , Disp: , Rfl:  .  Cholecalciferol (VITAMIN D3 PO), Take 1 capsule by mouth daily., Disp: , Rfl:  .  cilostazol (PLETAL) 100 MG tablet, Take 100 mg by mouth 2 (two) times daily., Disp: , Rfl:  .  clonazePAM (KLONOPIN) 0.5 MG tablet, Take 0.5 mg by mouth daily as needed for anxiety., Disp: , Rfl:  .  Cyanocobalamin (VITAMIN B-12 PO), Take 1 tablet by mouth daily., Disp: , Rfl:  .  isosorbide mononitrate (IMDUR) 60 MG 24 hr tablet, Take 60 mg by mouth daily., Disp: , Rfl:  .  JANUVIA 50 MG tablet, Take 50 mg by mouth 2 (two) times daily., Disp: , Rfl:  .  LANTUS SOLOSTAR 100 UNIT/ML Solostar Pen, Inject 35 Units into the skin 2 (two) times daily. , Disp: , Rfl:  .  metoprolol (LOPRESSOR) 50 MG tablet, Take 50 mg by mouth 2 (two) times daily., Disp: , Rfl:  .  nitroGLYCERIN (NITROSTAT) 0.4 MG SL tablet, Place 0.4 mg under the tongue every 5 (five) minutes as needed for chest pain. , Disp: , Rfl:  .  omeprazole (PRILOSEC) 20 MG capsule, Take 20  mg by mouth daily., Disp: , Rfl:  .  predniSONE (DELTASONE) 20 MG tablet, Take 2 tablets daily with breakfast., Disp: 10 tablet, Rfl: 0 .  pregabalin (LYRICA) 50 MG capsule, Take 50 mg by mouth 3 (three) times daily., Disp: , Rfl:  .  Pyridoxine HCl (VITAMIN B-6 PO), Take 1 tablet by mouth daily., Disp: , Rfl:  .  simvastatin (ZOCOR) 20 MG tablet, Take 20 mg by mouth every evening., Disp: , Rfl:  .  tamsulosin (FLOMAX) 0.4 MG CAPS capsule, Take 0.4 mg by mouth daily., Disp: , Rfl:   Past Medical History: Past Medical History:  Diagnosis Date  . Anxiety   . Arthritis   . Coronary artery disease   . Diabetes mellitus without complication ([place])   . Diverticulitis   . Dyspnea   . GERD (gastroesophageal reflux disease)   . Hypercholesteremia   . Hypertension   . Pneumonia    as a child  . Prostate cancer ([place])   . UTI (lower urinary tract infection)     Tobacco Use: Social History   Tobacco Use  Smoking Status Former Smoker  Smokeless Tobacco Never Used    Labs: Recent Review [facility] for [facility] Latest Ref Rng & Units 03/10/2016 10/08/2017  Hemoglobin A1c 4.8 - 5.6 % - 8.0(H)   TCO2 0 - 100 mmol/L 22 -      Capillary Blood Glucose: Lab Results  Component Value Date   GLUCAP 161 (H) 04/20/2018   GLUCAP 184 (H) 04/20/2018   GLUCAP 144 (H) 10/12/2017   GLUCAP 132 (H) 10/12/2017   GLUCAP 229 (H) 10/08/2017   POCT Glucose    Row Name 04/20/18 1259             POCT Blood Glucose   Pre-Exercise  184 mg/dL       Post-Exercise  161 mg/dL          Pulmonary Assessment Scores: Pulmonary Assessment Scores    Row Name 04/15/18 0812         ADL UCSD   ADL Phase  Entry       mMRC Score   mMRC Score  1        Pulmonary Function Assessment: Pulmonary Function Assessment - 01/29/18 1139      Breath   Bilateral Breath Sounds  Clear    Shortness of Breath  Yes;Limiting activity       Exercise Target Goals:     Exercise Program Goal: Individual exercise prescription set using results from initial 6 min walk test and THRR while considering  patient's activity barriers and safety.    Exercise Prescription Goal: Initial exercise prescription builds to 30-45 minutes a day of aerobic activity, 2-3 days per week.  Home exercise guidelines will be given to patient during program as part of exercise prescription that the participant will acknowledge.  Activity Barriers & Risk Stratification: Activity Barriers & Cardiac Risk Stratification - 01/29/18 1115      Activity Barriers & Cardiac Risk Stratification   Activity Barriers  History of Falls;Balance Concerns;Assistive Device;Decreased Ventricular Function;Deconditioning;Other (comment)       6 Minute Walk: 6 Minute Walk    Row Name 04/15/18 0812         6 Minute Walk   Phase  Initial     Distance  900 feet     Walk Time  6 minutes     # of Rest Breaks  0     MPH  1.7     METS  2.3     RPE  14     Perceived Dyspnea   3     Symptoms  Yes (comment)     Comments  leg pain 9/10     Resting HR  67 bpm     Resting BP  138/60     Resting Oxygen Saturation   95 %     Exercise Oxygen Saturation  during 6 min walk  95 %     Max Ex. HR  80 bpm     Max Ex. BP  166/64       Interval HR   1 Minute HR  74     2 Minute HR  80     3 Minute HR  80     4 Minute HR  76     5 Minute HR  77     6 Minute HR  78     2 Minute Post HR  71     Interval Heart Rate?  Yes       Interval Oxygen   Interval Oxygen?  Yes     Baseline Oxygen Saturation %  95 %     1 Minute Oxygen Saturation %  95 %  1 Minute Liters of Oxygen  0 L     2 Minute Oxygen Saturation %  96 %     2 Minute Liters of Oxygen  0 L     3 Minute Oxygen Saturation %  96 %     3 Minute Liters of Oxygen  0 L     4 Minute Oxygen Saturation %  96 %     4 Minute Liters of Oxygen  0 L     5 Minute Oxygen Saturation %  96 %     5 Minute Liters of Oxygen  0 L     6 Minute Oxygen  Saturation %  96 %     6 Minute Liters of Oxygen  0 L     2 Minute Post Oxygen Saturation %  97 %     2 Minute Post Liters of Oxygen  0 L        Oxygen Initial Assessment: Oxygen Initial Assessment - 04/15/18 0811      Initial 6 min Walk   Oxygen Used  None      Program Oxygen Prescription   Program Oxygen Prescription  None       Oxygen Re-Evaluation:   Oxygen Discharge (Final Oxygen Re-Evaluation):   Initial Exercise Prescription: Initial Exercise Prescription - 04/15/18 0800      Date of Initial Exercise RX and Referring Provider   Date  04/13/18    Referring Provider  Dr. Einar Gip      Recumbant Bike   Level  1    Watts  10    Minutes  17      NuStep   Level  2    SPM  80    Minutes  17    METs  1.5      Track   Laps  5    Minutes  17      Prescription Details   Frequency (times per week)  2    Duration  Progress to 45 minutes of aerobic exercise without signs/symptoms of physical distress      Intensity   THRR 40-80% of Max Heartrate  55-110    Ratings of Perceived Exertion  11-13    Perceived Dyspnea  0-4      Progression   Progression  Continue progressive overload as per policy without signs/symptoms or physical distress.      Resistance Training   Training Prescription  Yes    Weight  blue bands    Reps  10-15       Perform Capillary Blood Glucose checks as needed.  Exercise Prescription Changes:  Exercise Prescription Changes    Row Name 04/20/18 1200             Response to Exercise   Blood Pressure (Admit)  124/58       Blood Pressure (Exercise)  124/60       Blood Pressure (Exit)  110/60       Heart Rate (Admit)  65 bpm       Heart Rate (Exercise)  75 bpm       Heart Rate (Exit)  65 bpm       Oxygen Saturation (Admit)  97 %       Oxygen Saturation (Exercise)  96 %       Oxygen Saturation (Exit)  97 %       Rating of Perceived Exertion (Exercise)  15       Perceived Dyspnea (Exercise)  1       Duration  Progress to 45  minutes of aerobic exercise without signs/symptoms of physical distress       Intensity  - 40-80% HRR         Progression   Progression  Continue to progress workloads to maintain intensity without signs/symptoms of physical distress.         Resistance Training   Training Prescription  Yes       Weight  blue bands       Reps  10-15       Time  10 Minutes         Interval Training   Interval Training  No         Recumbant Bike   Level  1       Watts  10       Minutes  17         NuStep   Level  2       SPM  80       Minutes  17       METs  1.9         Track   Laps  10       Minutes  17          Exercise Comments:   Exercise Goals and Review:  Exercise Goals    Row Name 01/29/18 1115             Exercise Goals   Increase Physical Activity  Yes       Intervention  Provide advice, education, support and counseling about physical activity/exercise needs.;Develop an individualized exercise prescription for aerobic and resistive training based on initial evaluation findings, risk stratification, comorbidities and participant's personal goals.       Expected Outcomes  Short Term: Attend rehab on a regular basis to increase amount of physical activity.;Long Term: Add in home exercise to make exercise part of routine and to increase amount of physical activity.;Long Term: Exercising regularly at least 3-5 days a week.       Increase Strength and Stamina  Yes       Intervention  Provide advice, education, support and counseling about physical activity/exercise needs.;Develop an individualized exercise prescription for aerobic and resistive training based on initial evaluation findings, risk stratification, comorbidities and participant's personal goals.       Expected Outcomes  Short Term: Increase workloads from initial exercise prescription for resistance, speed, and METs.;Short Term: Perform resistance training exercises routinely during rehab and add in resistance training  at home;Long Term: Improve cardiorespiratory fitness, muscular endurance and strength as measured by increased METs and functional capacity (6MWT)       Able to understand and use rate of perceived exertion (RPE) scale  Yes       Intervention  Provide education and explanation on how to use RPE scale       Expected Outcomes  Short Term: Able to use RPE daily in rehab to express subjective intensity level;Long Term:  Able to use RPE to guide intensity level when exercising independently       Able to understand and use Dyspnea scale  Yes       Intervention  Provide education and explanation on how to use Dyspnea scale       Expected Outcomes  Short Term: Able to use Dyspnea scale daily in rehab to express subjective sense of shortness of breath during exertion;Long Term: Able to use Dyspnea scale  to guide intensity level when exercising independently       Knowledge and understanding of Target Heart Rate Range (THRR)  Yes       Intervention  Provide education and explanation of THRR including how the numbers were predicted and where they are located for reference       Expected Outcomes  Short Term: Able to state/look up THRR;Short Term: Able to use daily as guideline for intensity in rehab;Long Term: Able to use THRR to govern intensity when exercising independently       Understanding of Exercise Prescription  Yes       Intervention  Provide education, explanation, and written materials on patient's individual exercise prescription       Expected Outcomes  Short Term: Able to explain program exercise prescription;Long Term: Able to explain home exercise prescription to exercise independently          Exercise Goals Re-Evaluation : Exercise Goals Re-Evaluation    Row Name 04/19/18 0941             Exercise Goal Re-Evaluation   Exercise Goals Review  Increase Physical Activity;Able to understand and use rate of perceived exertion (RPE) scale;Knowledge and understanding of Target Heart Rate  Range (THRR);Understanding of Exercise Prescription;Increase Strength and Stamina;Able to understand and use Dyspnea scale       Comments  Patient will start his first day of rehab 04/20/18       Expected Outcomes  Through exercise at rehab and at home, the patient will decrease shortness of breath with daily activities and feel confident in carrying out an exercise regime at home.           Discharge Exercise Prescription (Final Exercise Prescription Changes): Exercise Prescription Changes - 04/20/18 1200      Response to Exercise   Blood Pressure (Admit)  124/58    Blood Pressure (Exercise)  124/60    Blood Pressure (Exit)  110/60    Heart Rate (Admit)  65 bpm    Heart Rate (Exercise)  75 bpm    Heart Rate (Exit)  65 bpm    Oxygen Saturation (Admit)  97 %    Oxygen Saturation (Exercise)  96 %    Oxygen Saturation (Exit)  97 %    Rating of Perceived Exertion (Exercise)  15    Perceived Dyspnea (Exercise)  1    Duration  Progress to 45 minutes of aerobic exercise without signs/symptoms of physical distress    Intensity  -- 40-80% HRR      Progression   Progression  Continue to progress workloads to maintain intensity without signs/symptoms of physical distress.      Resistance Training   Training Prescription  Yes    Weight  blue bands    Reps  10-15    Time  10 Minutes      Interval Training   Interval Training  No      Recumbant Bike   Level  1    Watts  10    Minutes  17      NuStep   Level  2    SPM  80    Minutes  17    METs  1.9      Track   Laps  10    Minutes  17       Nutrition:  Target Goals: Understanding of nutrition guidelines, daily intake of sodium <1514m, cholesterol <2066m calories 30% from fat and 7% or less from saturated fats,  daily to have 5 or more servings of fruits and vegetables.  Biometrics:    Nutrition Therapy Plan and Nutrition Goals:   Nutrition Assessments:   Nutrition Goals Re-Evaluation:   Nutrition Goals Discharge  (Final Nutrition Goals Re-Evaluation):   Psychosocial: Target Goals: Acknowledge presence or absence of significant depression and/or stress, maximize coping skills, provide positive support system. Participant is able to verbalize types and ability to use techniques and skills needed for reducing stress and depression.  Initial Review & Psychosocial Screening: Initial Psych Review & Screening - 04/20/18 1234      Initial Review   Current issues with  None Identified      Family Dynamics   Good Support System?  Yes      Barriers   Psychosocial barriers to participate in program  There are no identifiable barriers or psychosocial needs.      Screening Interventions   Interventions  Encouraged to exercise    Expected Outcomes  Short Term goal: Identification and review with participant of any Quality of Life or Depression concerns found by scoring the questionnaire.;Long Term goal: The participant improves quality of Life and PHQ9 Scores as seen by post scores and/or verbalization of changes       Quality of Life Scores:  Scores of 19 and below usually indicate a poorer quality of life in these areas.  A difference of  2-3 points is a clinically meaningful difference.  A difference of 2-3 points in the total score of the Quality of Life Index has been associated with significant improvement in overall quality of life, self-image, physical symptoms, and general health in studies assessing change in quality of life.   PHQ-9: Recent Review Flowsheet Data    Depression screen [facility] 2/9 01/29/2018   Decreased Interest 0   Down, Depressed, Hopeless 0   PHQ - 2 Score 0     Interpretation of Total Score  Total Score Depression Severity:  1-4 = Minimal depression, 5-9 = Mild depression, 10-14 = Moderate depression, 15-19 = Moderately severe depression, 20-27 = Severe depression   Psychosocial Evaluation and Intervention: Psychosocial Evaluation - 04/20/18 1235      Psychosocial  Evaluation & Interventions   Interventions  Encouraged to exercise with the program and follow exercise prescription    Comments  Pt in today for his first day of exercise.  Pt enjoyed participating and is looking forward to returning.    Expected Outcomes  patient will remain free from psychosocial barriers to participation in pulmonary rehab    Continue Psychosocial Services   Follow up required by staff       Psychosocial Re-Evaluation:   Psychosocial Discharge (Final Psychosocial Re-Evaluation):   Education: Education Goals: Education classes will be provided on a weekly basis, covering required topics. Participant will state understanding/return demonstration of topics presented.  Learning Barriers/Preferences: Learning Barriers/Preferences - 01/29/18 1139      Learning Barriers/Preferences   Learning Barriers  None    Learning Preferences  Written Material;Verbal Instruction;Individual Instruction       Education Topics: Risk Factor Reduction:  -Group instruction that is supported by a PowerPoint presentation. Instructor discusses the definition of a risk factor, different risk factors for pulmonary disease, and how the heart and lungs work together.     Nutrition for Pulmonary Patient:  -Group instruction provided by PowerPoint slides, verbal discussion, and written materials to support subject matter. The instructor gives an explanation and review of healthy diet recommendations, which includes a discussion on weight  management, recommendations for fruit and vegetable consumption, as well as protein, fluid, caffeine, fiber, sodium, sugar, and alcohol. Tips for eating when patients are short of breath are discussed.   Pursed Lip Breathing:  -Group instruction that is supported by demonstration and informational handouts. Instructor discusses the benefits of pursed lip and diaphragmatic breathing and detailed demonstration on how to preform both.     Oxygen Safety:   -Group instruction provided by PowerPoint, verbal discussion, and written material to support subject matter. There is an overview of "What is Oxygen" and "Why do we need it".  Instructor also reviews how to create a safe environment for oxygen use, the importance of using oxygen as prescribed, and the risks of noncompliance. There is a brief discussion on traveling with oxygen and resources the patient may utilize.   Oxygen Equipment:  -Group instruction provided by [facility] Staff utilizing handouts, written materials, and equipment demonstrations.   Signs and Symptoms:  -Group instruction provided by written material and verbal discussion to support subject matter. Warning signs and symptoms of infection, stroke, and heart attack are reviewed and when to call the physician/911 reinforced. Tips for preventing the spread of infection discussed.   Advanced Directives:  -Group instruction provided by verbal instruction and written material to support subject matter. Instructor reviews Advanced Directive laws and proper instruction for filling out document.   Pulmonary Video:  -Group video education that reviews the importance of medication and oxygen compliance, exercise, good nutrition, pulmonary hygiene, and pursed lip and diaphragmatic breathing for the pulmonary patient.   Exercise for the Pulmonary Patient:  -Group instruction that is supported by a PowerPoint presentation. Instructor discusses benefits of exercise, core components of exercise, frequency, duration, and intensity of an exercise routine, importance of utilizing pulse oximetry during exercise, safety while exercising, and options of places to exercise outside of rehab.     Pulmonary Medications:  -Verbally interactive group education provided by instructor with focus on inhaled medications and proper administration.   Anatomy and Physiology of the Respiratory System and Intimacy:  -Group instruction provided by  PowerPoint, verbal discussion, and written material to support subject matter. Instructor reviews respiratory cycle and anatomical components of the respiratory system and their functions. Instructor also reviews differences in obstructive and restrictive respiratory diseases with examples of each. Intimacy, Sex, and Sexuality differences are reviewed with a discussion on how relationships can change when diagnosed with pulmonary disease. Common sexual concerns are reviewed.   MD DAY -A group question and answer session with a medical doctor that allows participants to ask questions that relate to their pulmonary disease state.   OTHER EDUCATION -Group or individual verbal, written, or video instructions that support the educational goals of the pulmonary rehab program.   Holiday Eating Survival Tips:  -Group instruction provided by PowerPoint slides, verbal discussion, and written materials to support subject matter. The instructor gives patients tips, tricks, and techniques to help them not only survive but enjoy the holidays despite the onslaught of food that accompanies the holidays.   Knowledge Questionnaire Score:   Core Components/Risk Factors/Patient Goals at Admission: Personal Goals and Risk Factors at Admission - 01/29/18 1140      Core Components/Risk Factors/Patient Goals on Admission   Improve shortness of breath with ADL's  Yes    Intervention  Provide education, individualized exercise plan and daily activity instruction to help decrease symptoms of SOB with activities of daily living.    Expected Outcomes  Short Term: Improve cardiorespiratory fitness to  achieve a reduction of symptoms when performing ADLs;Long Term: Be able to perform more ADLs without symptoms or delay the onset of symptoms    Diabetes  Yes    Intervention  Provide education about signs/symptoms and action to take for hypo/hyperglycemia.;Provide education about proper nutrition, including hydration, and  aerobic/resistive exercise prescription along with prescribed medications to achieve blood glucose in normal ranges: Fasting glucose 65-99 mg/dL    Expected Outcomes  Short Term: Participant verbalizes understanding of the signs/symptoms and immediate care of hyper/hypoglycemia, proper foot care and importance of medication, aerobic/resistive exercise and nutrition plan for blood glucose control.;Long Term: Attainment of HbA1C < 7%.    Heart Failure  Yes    Intervention  Provide a combined exercise and nutrition program that is supplemented with education, support and counseling about heart failure. Directed toward relieving symptoms such as shortness of breath, decreased exercise tolerance, and extremity edema.    Expected Outcomes  Improve functional capacity of life;Short term: Attendance in program 2-3 days a week with increased exercise capacity. Reported lower sodium intake. Reported increased fruit and vegetable intake. Reports medication compliance.;Short term: Daily weights obtained and reported for increase. Utilizing diuretic protocols set by physician.;Long term: Adoption of self-care skills and reduction of barriers for early signs and symptoms recognition and intervention leading to self-care maintenance.       Core Components/Risk Factors/Patient Goals Review:  Goals and Risk Factor Review    Row Name 04/20/18 1232             Core Components/Risk Factors/Patient Goals Review   Personal Goals Review  Improve shortness of breath with ADL's;Weight Management/Obesity;Diabetes       Review  Pt completed his first day of exercise on 7/23.  Unable to review pt progress toward pt goals       Expected Outcomes  See Admission Goals/Outcomes          Core Components/Risk Factors/Patient Goals at Discharge (Final Review):  Goals and Risk Factor Review - 04/20/18 1232      Core Components/Risk Factors/Patient Goals Review   Personal Goals Review  Improve shortness of breath with  ADL's;Weight Management/Obesity;Diabetes    Review  Pt completed his first day of exercise on 7/23.  Unable to review pt progress toward pt goals    Expected Outcomes  See Admission Goals/Outcomes       ITP Comments: ITP Comments    Row Name 04/20/18 1230           ITP Comments  Dr. Jennet Maduro, Medical Director          Comments: Pt has completed 1 exercise session

## 2018-04-21 DIAGNOSIS — I10 Essential (primary) hypertension: Secondary | ICD-10-CM | POA: Diagnosis not present

## 2018-04-21 DIAGNOSIS — R1013 Epigastric pain: Secondary | ICD-10-CM | POA: Diagnosis not present

## 2018-04-21 DIAGNOSIS — I2510 Atherosclerotic heart disease of native coronary artery without angina pectoris: Secondary | ICD-10-CM | POA: Diagnosis not present

## 2018-04-21 DIAGNOSIS — M545 Low back pain: Secondary | ICD-10-CM | POA: Diagnosis not present

## 2018-04-21 DIAGNOSIS — E785 Hyperlipidemia, unspecified: Secondary | ICD-10-CM | POA: Diagnosis not present

## 2018-04-21 DIAGNOSIS — N400 Enlarged prostate without lower urinary tract symptoms: Secondary | ICD-10-CM | POA: Diagnosis not present

## 2018-04-21 DIAGNOSIS — N183 Chronic kidney disease, stage 3 (moderate): Secondary | ICD-10-CM | POA: Diagnosis not present

## 2018-04-21 DIAGNOSIS — F419 Anxiety disorder, unspecified: Secondary | ICD-10-CM | POA: Diagnosis not present

## 2018-04-21 DIAGNOSIS — E118 Type 2 diabetes mellitus with unspecified complications: Secondary | ICD-10-CM | POA: Diagnosis not present

## 2018-04-22 ENCOUNTER — Encounter (HOSPITAL_COMMUNITY): Payer: Medicare PPO

## 2018-04-27 ENCOUNTER — Encounter (HOSPITAL_COMMUNITY)
Admission: RE | Admit: 2018-04-27 | Discharge: 2018-04-27 | Disposition: A | Discharge status: Home / Self Care | Payer: Medicare PPO | Source: Ambulatory Visit | Attending: Cardiology | Admitting: Cardiology

## 2018-04-27 DIAGNOSIS — I5032 Chronic diastolic (congestive) heart failure: Secondary | ICD-10-CM | POA: Diagnosis not present

## 2018-04-27 DIAGNOSIS — I509 Heart failure, unspecified: Secondary | ICD-10-CM

## 2018-04-27 NOTE — Progress Notes (Signed)
Daily Session Note  Patient Details  Name: Justin [name] MRN: [id number] Date of Birth: 02/28/1933 Referring Provider:     Pulmonary Rehab Walk Test from 04/13/2018 in [place]  Referring Provider  Dr. Einar Gip      Encounter Date: 04/27/2018  Check In: Session Check In - 04/27/18 1053      Check-In   Supervising physician immediately available to respond to emergencies  [facility] immediately available    Physician(s)   Dr. Rodena Piety    Location  [facility]    Staff Present  Maurice Small, RN, BSN;Molly DiVincenzo, MS, ACSM RCEP, Exercise Physiologist;Shontavia Mickel Ysidro Evert, RN    Medication changes reported      No    Fall or balance concerns reported     No    Tobacco Cessation  No Change    Warm-up and Cool-down  Performed as group-led instruction    Resistance Training Performed  Yes    VAD Patient?  No    PAD/SET Patient?  No      Pain Assessment   Currently in Pain?  No/denies    Multiple Pain Sites  No       Capillary Blood Glucose: No results found for this or any previous visit (from the past 24 hour(s)).    Social History   Tobacco Use  Smoking Status Former Smoker  Smokeless Tobacco Never Used    Goals Met:  No report of cardiac concerns or symptoms Strength training completed today  Goals Unmet:  Not Applicable  Comments: Service time is from 1030 to 1205    Dr. Rush Farmer is Medical Director for Pulmonary Rehab at [facility].

## 2018-04-27 NOTE — Progress Notes (Signed)
Justin [name] 84 y.o. male   DOB: Dec 14, 1932 MRN: [id number]          Nutrition 1. Congestive heart failure, unspecified HF chronicity, unspecified heart failure type [facility])    Past Medical History:  Diagnosis Date  . Anxiety   . Arthritis   . Coronary artery disease   . Diabetes mellitus without complication ([place])   . Diverticulitis   . Dyspnea   . GERD (gastroesophageal reflux disease)   . Hypercholesteremia   . Hypertension   . Pneumonia    as a child  . Prostate cancer ([place])   . UTI (lower urinary tract infection)    Meds reviewed. Lantus, lopressor, vit D, simvastatin, lantus,  noted  Ht: Ht Readings from Last 1 Encounters:  03/30/18 5' 6.25" (1.683 m)     Wt:  Wt Readings from Last 3 Encounters:  04/20/18 199 lb 1.2 oz (90.3 kg)  03/30/18 198 lb 6.4 oz (90 kg)  01/29/18 196 lb 13.9 oz (89.3 kg)     BMI: 31.89    Current tobacco use? No   Labs:  Lipid Panel  No results found for: CHOL, TRIG, HDL, CHOLHDL, VLDL, LDLCALC, LDLDIRECT  Lab Results  Component Value Date   HGBA1C 8.0 (H) 10/08/2017   Note Spoke with pt. Pt is obese.  Pt eats 2 meals a day; most prepared at home.  Making healthy food choices the majority of the time. Pt's Rate Your Plate results reviewed with pt. Pt does not avoid salty food; uses canned/ convenience food.  Pt adds salt to food.  The role of sodium in lung disease reviewed with pt. Educated pt on label reading and set goal with patient to limit the amount of sodium, saturated and trans fats. Pt expressed understanding of the information reviewed.   Pt is diabetic.  Pt checks CBG's 2-3x a day times a day. CBG's reportedly 130-150 - mg/dL before meals. Pt reports pre-exercise CBG's have been  180's- mg/dL and post-exercise CBG's have been  160's mg/dL; Discussed switching from refined grains to complex carbohydrates, additionally discussed limiting sweets, sugar sweetened beverages. Pt expressed understanding of the information reviewed via  feedback method.    Nutrition Diagnosis ? Excessive sodium intake related to over consumption of processed food as evidenced by frequent consumption of convenience food/ canned vegetables and eating out frequently. ? Food-and nutrition-related knowledge deficit related to lack of exposure to information as related to diagnosis of pulmonary disease ? Overweight/obesity related to excessive energy intake as evidenced by a BMI of 31.89  Nutrition Intervention ? Pt's individual nutrition plan and goals reviewed with pt. ? Benefits of adopting healthy eating habits discussed when pt's Rate Your Plate reviewed.  Goal(s) 1. Pt to identify and limit food sources of sodium. 2. Identify food quantities necessary to achieve wt loss of  -2# per week to a goal wt loss of 6-24 lb at graduation from pulmonary rehab. 3. Describe the benefit of including fruits, vegetables, whole grains, and low-fat dairy products in a healthy meal plan. 4. Improved blood glucose control as evidenced by pt's A1c trending from 8.0 to less than 7.0  Plan:  Pt to attend Pulmonary Nutrition class Will provide client-centered nutrition education as part of interdisciplinary care.    Monitor and Evaluate progress toward nutrition goal with team.   Laurina Bustle, MS, RD, LDN 04/27/2018 11:46 AM

## 2018-04-29 ENCOUNTER — Encounter (HOSPITAL_COMMUNITY)
Admission: RE | Admit: 2018-04-29 | Discharge: 2018-04-29 | Disposition: A | Discharge status: Home / Self Care | Payer: Medicare PPO | Source: Ambulatory Visit | Attending: Cardiology | Admitting: Cardiology

## 2018-04-29 DIAGNOSIS — I5032 Chronic diastolic (congestive) heart failure: Secondary | ICD-10-CM | POA: Diagnosis not present

## 2018-04-29 DIAGNOSIS — I509 Heart failure, unspecified: Secondary | ICD-10-CM

## 2018-04-29 NOTE — Progress Notes (Signed)
Daily Session Note  Patient Details  Name: Justin [name] MRN: [id number] Date of Birth: 04-12-1933 Referring Provider:     Pulmonary Rehab Walk Test from 04/13/2018 in [place]  Referring Provider  Dr. Einar Gip      Encounter Date: 04/29/2018  Check In: Session Check In - 04/29/18 1103      Check-In   Supervising physician immediately available to respond to emergencies  [facility] immediately available    Physician(s)  Sr. Purohit    Location  [facility]    Staff Present  Su Hilt, MS, ACSM RCEP, Exercise Physiologist;Lisa Ysidro Evert, RN;Carlette Carlton, RN, BSN    Medication changes reported      No    Fall or balance concerns reported     No    Tobacco Cessation  No Change    Warm-up and Cool-down  Performed as group-led Higher education careers adviser Performed  Yes    VAD Patient?  No    PAD/SET Patient?  No      Pain Assessment   Currently in Pain?  No/denies    Multiple Pain Sites  No       Capillary Blood Glucose: No results found for this or any previous visit (from the past 24 hour(s)).    Social History   Tobacco Use  Smoking Status Former Smoker  Smokeless Tobacco Never Used    Goals Met:  Exercise tolerated well No report of cardiac concerns or symptoms Strength training completed today  Goals Unmet:  Not Applicable  Comments: Service time is from 10:30a to 12:30p    Dr. Rush Farmer is Medical Director for Pulmonary Rehab at [facility].

## 2018-05-04 ENCOUNTER — Encounter (HOSPITAL_COMMUNITY)
Admission: RE | Admit: 2018-05-04 | Discharge: 2018-05-04 | Disposition: A | Discharge status: Home / Self Care | Payer: Medicare PPO | Source: Ambulatory Visit | Attending: Cardiology | Admitting: Cardiology

## 2018-05-04 VITALS — Wt 200.0 lb

## 2018-05-04 DIAGNOSIS — I5032 Chronic diastolic (congestive) heart failure: Secondary | ICD-10-CM | POA: Diagnosis not present

## 2018-05-04 DIAGNOSIS — I509 Heart failure, unspecified: Secondary | ICD-10-CM

## 2018-05-04 NOTE — Progress Notes (Signed)
Daily Session Note  Patient Details  Name: Justin [name] MRN: [id number] Date of Birth: 1933/02/09 Referring Provider:     Pulmonary Rehab Walk Test from 04/13/2018 in [place]  Referring Provider  Dr. Einar Gip      Encounter Date: 05/04/2018  Check In: Session Check In - 05/04/18 1053      Check-In   Supervising physician immediately available to respond to emergencies  [facility] immediately available    Physician(s)  Dr. Reesa Chew    Location  [facility]    Staff Present  Su Hilt, MS, ACSM RCEP, Exercise Physiologist;Lisa Ysidro Evert, RN;Carlette Carlton, RN, BSN    Medication changes reported      No    Fall or balance concerns reported     No    Tobacco Cessation  No Change    Warm-up and Cool-down  Performed as group-led instruction    Resistance Training Performed  Yes    VAD Patient?  No    PAD/SET Patient?  No      Pain Assessment   Currently in Pain?  No/denies    Multiple Pain Sites  No       Capillary Blood Glucose: No results found for this or any previous visit (from the past 24 hour(s)). POCT Glucose - 05/04/18 1233      POCT Blood Glucose   Pre-Exercise  174 mg/dL    Post-Exercise  116 mg/dL      Exercise Prescription Changes - 05/04/18 1200      Response to Exercise   Blood Pressure (Admit)  124/56    Blood Pressure (Exercise)  142/64    Blood Pressure (Exit)  122/58    Heart Rate (Admit)  63 bpm    Heart Rate (Exercise)  73 bpm    Heart Rate (Exit)  67 bpm    Oxygen Saturation (Admit)  97 %    Oxygen Saturation (Exercise)  97 %    Oxygen Saturation (Exit)  94 %    Rating of Perceived Exertion (Exercise)  13    Perceived Dyspnea (Exercise)  1    Duration  Progress to 45 minutes of aerobic exercise without signs/symptoms of physical distress    Intensity  -- 40-80% HRR      Progression   Progression  Continue to progress workloads to maintain intensity without signs/symptoms of physical  distress.      Resistance Training   Training Prescription  Yes    Weight  blue bands    Reps  10-15    Time  10 Minutes      Interval Training   Interval Training  No      NuStep   Level  2    SPM  80    Minutes  17    METs  2.2      Track   Laps  11    Minutes  17       Social History   Tobacco Use  Smoking Status Former Smoker  Smokeless Tobacco Never Used    Goals Met:  Exercise tolerated well No report of cardiac concerns or symptoms Strength training completed today  Goals Unmet:  Not Applicable  Comments: Service time is from 10:30a to 12:15p    Dr. Rush Farmer is Medical Director for Pulmonary Rehab at [facility].

## 2018-05-06 ENCOUNTER — Encounter (HOSPITAL_COMMUNITY): Payer: Medicare PPO

## 2018-05-06 DIAGNOSIS — C61 Malignant neoplasm of prostate: Secondary | ICD-10-CM | POA: Diagnosis not present

## 2018-05-06 DIAGNOSIS — I6523 Occlusion and stenosis of bilateral carotid arteries: Secondary | ICD-10-CM | POA: Diagnosis not present

## 2018-05-11 ENCOUNTER — Encounter (HOSPITAL_COMMUNITY): Payer: Medicare PPO

## 2018-05-12 ENCOUNTER — Telehealth (HOSPITAL_COMMUNITY): Payer: Self-pay | Admitting: Internal Medicine

## 2018-05-13 ENCOUNTER — Encounter (HOSPITAL_COMMUNITY): Payer: Medicare PPO

## 2018-05-14 DIAGNOSIS — C61 Malignant neoplasm of prostate: Secondary | ICD-10-CM | POA: Diagnosis not present

## 2018-05-14 DIAGNOSIS — N400 Enlarged prostate without lower urinary tract symptoms: Secondary | ICD-10-CM | POA: Diagnosis not present

## 2018-05-18 ENCOUNTER — Encounter (HOSPITAL_COMMUNITY)
Admission: RE | Admit: 2018-05-18 | Discharge: 2018-05-18 | Disposition: A | Discharge status: Home / Self Care | Payer: Medicare PPO | Source: Ambulatory Visit

## 2018-05-18 NOTE — Progress Notes (Signed)
Pulmonary Individual Treatment Plan  Patient Details  Name: Justin [name] MRN: [id number] Date of Birth: 21-Apr-1933 Referring Provider:     Pulmonary Rehab Walk Test from 04/13/2018 in [place]  Referring Provider  Dr. Einar Gip      Initial Encounter Date:    Pulmonary Rehab Walk Test from 04/13/2018 in [place]  Date  04/13/18      Visit Diagnosis: Congestive heart failure, unspecified HF chronicity, unspecified heart failure type ([place])  Patient's Home Medications on Admission:   Current Outpatient Medications:  .  acetaminophen (TYLENOL) 500 MG tablet, Take 1,000 mg by mouth every 6 (six) hours as needed for moderate pain or headache., Disp: , Rfl:  .  AMITIZA 24 MCG capsule, Take 24 mcg by mouth daily as needed for constipation. , Disp: , Rfl:  .  aspirin EC 81 MG tablet, Take 81 mg by mouth daily., Disp: , Rfl:  .  chlorthalidone (HYGROTON) 25 MG tablet, Take 25 mg by mouth daily. , Disp: , Rfl:  .  Cholecalciferol (VITAMIN D3 PO), Take 1 capsule by mouth daily., Disp: , Rfl:  .  cilostazol (PLETAL) 100 MG tablet, Take 100 mg by mouth 2 (two) times daily., Disp: , Rfl:  .  clonazePAM (KLONOPIN) 0.5 MG tablet, Take 0.5 mg by mouth daily as needed for anxiety., Disp: , Rfl:  .  Cyanocobalamin (VITAMIN B-12 PO), Take 1 tablet by mouth daily., Disp: , Rfl:  .  isosorbide mononitrate (IMDUR) 60 MG 24 hr tablet, Take 60 mg by mouth daily., Disp: , Rfl:  .  JANUVIA 50 MG tablet, Take 50 mg by mouth 2 (two) times daily., Disp: , Rfl:  .  LANTUS SOLOSTAR 100 UNIT/ML Solostar Pen, Inject 35 Units into the skin 2 (two) times daily. , Disp: , Rfl:  .  metoprolol (LOPRESSOR) 50 MG tablet, Take 50 mg by mouth 2 (two) times daily., Disp: , Rfl:  .  nitroGLYCERIN (NITROSTAT) 0.4 MG SL tablet, Place 0.4 mg under the tongue every 5 (five) minutes as needed for chest pain. , Disp: , Rfl:  .  omeprazole (PRILOSEC) 20 MG capsule, Take 20  mg by mouth daily., Disp: , Rfl:  .  predniSONE (DELTASONE) 20 MG tablet, Take 2 tablets daily with breakfast., Disp: 10 tablet, Rfl: 0 .  pregabalin (LYRICA) 50 MG capsule, Take 50 mg by mouth 3 (three) times daily., Disp: , Rfl:  .  Pyridoxine HCl (VITAMIN B-6 PO), Take 1 tablet by mouth daily., Disp: , Rfl:  .  simvastatin (ZOCOR) 20 MG tablet, Take 20 mg by mouth every evening., Disp: , Rfl:  .  tamsulosin (FLOMAX) 0.4 MG CAPS capsule, Take 0.4 mg by mouth daily., Disp: , Rfl:   Past Medical History: Past Medical History:  Diagnosis Date  . Anxiety   . Arthritis   . Coronary artery disease   . Diabetes mellitus without complication ([place])   . Diverticulitis   . Dyspnea   . GERD (gastroesophageal reflux disease)   . Hypercholesteremia   . Hypertension   . Pneumonia    as a child  . Prostate cancer ([place])   . UTI (lower urinary tract infection)     Tobacco Use: Social History   Tobacco Use  Smoking Status Former Smoker  Smokeless Tobacco Never Used    Labs: Recent Review [facility] for [facility] Latest Ref Rng & Units 03/10/2016 10/08/2017  Hemoglobin A1c 4.8 - 5.6 % - 8.0(H)   TCO2 0 - 100 mmol/L 22 -      Capillary Blood Glucose: Lab Results  Component Value Date   GLUCAP 161 (H) 04/20/2018   GLUCAP 184 (H) 04/20/2018   GLUCAP 144 (H) 10/12/2017   GLUCAP 132 (H) 10/12/2017   GLUCAP 229 (H) 10/08/2017   POCT Glucose    Row Name 04/20/18 1259 05/04/18 1233           POCT Blood Glucose   Pre-Exercise  184 mg/dL  174 mg/dL      Post-Exercise  161 mg/dL  116 mg/dL         Pulmonary Assessment Scores: Pulmonary Assessment Scores    Row Name 04/15/18 0812         ADL UCSD   ADL Phase  Entry       mMRC Score   mMRC Score  1        Pulmonary Function Assessment: Pulmonary Function Assessment - 01/29/18 1139      Breath   Bilateral Breath Sounds  Clear    Shortness of Breath  Yes;Limiting activity        Exercise Target Goals: Exercise Program Goal: Individual exercise prescription set using results from initial 6 min walk test and THRR while considering  patient's activity barriers and safety.   Exercise Prescription Goal: Initial exercise prescription builds to 30-45 minutes a day of aerobic activity, 2-3 days per week.  Home exercise guidelines will be given to patient during program as part of exercise prescription that the participant will acknowledge.  Activity Barriers & Risk Stratification: Activity Barriers & Cardiac Risk Stratification - 01/29/18 1115      Activity Barriers & Cardiac Risk Stratification   Activity Barriers  History of Falls;Balance Concerns;Assistive Device;Decreased Ventricular Function;Deconditioning;Other (comment)       6 Minute Walk: 6 Minute Walk    Row Name 04/15/18 0812         6 Minute Walk   Phase  Initial     Distance  900 feet     Walk Time  6 minutes     # of Rest Breaks  0     MPH  1.7     METS  2.3     RPE  14     Perceived Dyspnea   3     Symptoms  Yes (comment)     Comments  leg pain 9/10     Resting HR  67 bpm     Resting BP  138/60     Resting Oxygen Saturation   95 %     Exercise Oxygen Saturation  during 6 min walk  95 %     Max Ex. HR  80 bpm     Max Ex. BP  166/64       Interval HR   1 Minute HR  74     2 Minute HR  80     3 Minute HR  80     4 Minute HR  76     5 Minute HR  77     6 Minute HR  78     2 Minute Post HR  71     Interval Heart Rate?  Yes       Interval Oxygen   Interval Oxygen?  Yes     Baseline Oxygen Saturation %  95 %     1 Minute Oxygen Saturation %  95 %  1 Minute Liters of Oxygen  0 L     2 Minute Oxygen Saturation %  96 %     2 Minute Liters of Oxygen  0 L     3 Minute Oxygen Saturation %  96 %     3 Minute Liters of Oxygen  0 L     4 Minute Oxygen Saturation %  96 %     4 Minute Liters of Oxygen  0 L     5 Minute Oxygen Saturation %  96 %     5 Minute Liters of Oxygen  0 L      6 Minute Oxygen Saturation %  96 %     6 Minute Liters of Oxygen  0 L     2 Minute Post Oxygen Saturation %  97 %     2 Minute Post Liters of Oxygen  0 L        Oxygen Initial Assessment: Oxygen Initial Assessment - 04/15/18 0811      Initial 6 min Walk   Oxygen Used  None      Program Oxygen Prescription   Program Oxygen Prescription  None       Oxygen Re-Evaluation: Oxygen Re-Evaluation    Row Name 05/17/18 0855             Program Oxygen Prescription   Program Oxygen Prescription  None         Home Oxygen   Home Oxygen Device  None       Sleep Oxygen Prescription  None       Home Exercise Oxygen Prescription  None       Home at Rest Exercise Oxygen Prescription  None          Oxygen Discharge (Final Oxygen Re-Evaluation): Oxygen Re-Evaluation - 05/17/18 0855      Program Oxygen Prescription   Program Oxygen Prescription  None      Home Oxygen   Home Oxygen Device  None    Sleep Oxygen Prescription  None    Home Exercise Oxygen Prescription  None    Home at Rest Exercise Oxygen Prescription  None       Initial Exercise Prescription: Initial Exercise Prescription - 04/15/18 0800      Date of Initial Exercise RX and Referring Provider   Date  04/13/18    Referring Provider  Dr. Einar Gip      Recumbant Bike   Level  1    Watts  10    Minutes  17      NuStep   Level  2    SPM  80    Minutes  17    METs  1.5      Track   Laps  5    Minutes  17      Prescription Details   Frequency (times per week)  2    Duration  Progress to 45 minutes of aerobic exercise without signs/symptoms of physical distress      Intensity   THRR 40-80% of Max Heartrate  55-110    Ratings of Perceived Exertion  11-13    Perceived Dyspnea  0-4      Progression   Progression  Continue progressive overload as per policy without signs/symptoms or physical distress.      Resistance Training   Training Prescription  Yes    Weight  blue bands    Reps  10-15        Perform Capillary  Blood Glucose checks as needed.  Exercise Prescription Changes:  Exercise Prescription Changes    Row Name 04/20/18 1200 05/04/18 1200           Response to Exercise   Blood Pressure (Admit)  124/58  124/56      Blood Pressure (Exercise)  124/60  142/64      Blood Pressure (Exit)  110/60  122/58      Heart Rate (Admit)  65 bpm  63 bpm      Heart Rate (Exercise)  75 bpm  73 bpm      Heart Rate (Exit)  65 bpm  67 bpm      Oxygen Saturation (Admit)  97 %  97 %      Oxygen Saturation (Exercise)  96 %  97 %      Oxygen Saturation (Exit)  97 %  94 %      Rating of Perceived Exertion (Exercise)  15  13      Perceived Dyspnea (Exercise)  1  1      Duration  Progress to 45 minutes of aerobic exercise without signs/symptoms of physical distress  Progress to 45 minutes of aerobic exercise without signs/symptoms of physical distress      Intensity  - 40-80% HRR  - 40-80% HRR        Progression   Progression  Continue to progress workloads to maintain intensity without signs/symptoms of physical distress.  Continue to progress workloads to maintain intensity without signs/symptoms of physical distress.        Resistance Training   Training Prescription  Yes  Yes      Weight  blue bands  blue bands      Reps  10-15  10-15      Time  10 Minutes  10 Minutes        Interval Training   Interval Training  No  No        Recumbant Bike   Level  1  -      Watts  10  -      Minutes  17  -        NuStep   Level  2  2      SPM  80  80      Minutes  17  17      METs  1.9  2.2        Track   Laps  10  11      Minutes  17  17         Exercise Comments:   Exercise Goals and Review:  Exercise Goals    Row Name 01/29/18 1115             Exercise Goals   Increase Physical Activity  Yes       Intervention  Provide advice, education, support and counseling about physical activity/exercise needs.;Develop an individualized exercise prescription for aerobic and  resistive training based on initial evaluation findings, risk stratification, comorbidities and participant's personal goals.       Expected Outcomes  Short Term: Attend rehab on a regular basis to increase amount of physical activity.;Long Term: Add in home exercise to make exercise part of routine and to increase amount of physical activity.;Long Term: Exercising regularly at least 3-5 days a week.       Increase Strength and Stamina  Yes       Intervention  Provide advice, education, support and counseling about physical  activity/exercise needs.;Develop an individualized exercise prescription for aerobic and resistive training based on initial evaluation findings, risk stratification, comorbidities and participant's personal goals.       Expected Outcomes  Short Term: Increase workloads from initial exercise prescription for resistance, speed, and METs.;Short Term: Perform resistance training exercises routinely during rehab and add in resistance training at home;Long Term: Improve cardiorespiratory fitness, muscular endurance and strength as measured by increased METs and functional capacity (6MWT)       Able to understand and use rate of perceived exertion (RPE) scale  Yes       Intervention  Provide education and explanation on how to use RPE scale       Expected Outcomes  Short Term: Able to use RPE daily in rehab to express subjective intensity level;Long Term:  Able to use RPE to guide intensity level when exercising independently       Able to understand and use Dyspnea scale  Yes       Intervention  Provide education and explanation on how to use Dyspnea scale       Expected Outcomes  Short Term: Able to use Dyspnea scale daily in rehab to express subjective sense of shortness of breath during exertion;Long Term: Able to use Dyspnea scale to guide intensity level when exercising independently       Knowledge and understanding of Target Heart Rate Range (THRR)  Yes       Intervention  Provide  education and explanation of THRR including how the numbers were predicted and where they are located for reference       Expected Outcomes  Short Term: Able to state/look up THRR;Short Term: Able to use daily as guideline for intensity in rehab;Long Term: Able to use THRR to govern intensity when exercising independently       Understanding of Exercise Prescription  Yes       Intervention  Provide education, explanation, and written materials on patient's individual exercise prescription       Expected Outcomes  Short Term: Able to explain program exercise prescription;Long Term: Able to explain home exercise prescription to exercise independently          Exercise Goals Re-Evaluation : Exercise Goals Re-Evaluation    Row Name 04/19/18 0941 05/17/18 0856           Exercise Goal Re-Evaluation   Exercise Goals Review  Increase Physical Activity;Able to understand and use rate of perceived exertion (RPE) scale;Knowledge and understanding of Target Heart Rate Range (THRR);Understanding of Exercise Prescription;Increase Strength and Stamina;Able to understand and use Dyspnea scale  Increase Physical Activity;Able to understand and use rate of perceived exertion (RPE) scale;Knowledge and understanding of Target Heart Rate Range (THRR);Understanding of Exercise Prescription;Increase Strength and Stamina;Able to understand and use Dyspnea scale      Comments  Patient will start his first day of rehab 04/20/18  Patient has attended 4 rehab sessions. He has been dealing with some back and leg pain that has limited his exercise. I will cont to monitor and motivate patient.       Expected Outcomes  Through exercise at rehab and at home, the patient will decrease shortness of breath with daily activities and feel confident in carrying out an exercise regime at home.   Through exercise at rehab and at home, the patient will decrease shortness of breath with daily activities and feel confident in carrying out an  exercise regime at home.  Discharge Exercise Prescription (Final Exercise Prescription Changes): Exercise Prescription Changes - 05/04/18 1200      Response to Exercise   Blood Pressure (Admit)  124/56    Blood Pressure (Exercise)  142/64    Blood Pressure (Exit)  122/58    Heart Rate (Admit)  63 bpm    Heart Rate (Exercise)  73 bpm    Heart Rate (Exit)  67 bpm    Oxygen Saturation (Admit)  97 %    Oxygen Saturation (Exercise)  97 %    Oxygen Saturation (Exit)  94 %    Rating of Perceived Exertion (Exercise)  13    Perceived Dyspnea (Exercise)  1    Duration  Progress to 45 minutes of aerobic exercise without signs/symptoms of physical distress    Intensity  --   40-80% HRR     Progression   Progression  Continue to progress workloads to maintain intensity without signs/symptoms of physical distress.      Resistance Training   Training Prescription  Yes    Weight  blue bands    Reps  10-15    Time  10 Minutes      Interval Training   Interval Training  No      NuStep   Level  2    SPM  80    Minutes  17    METs  2.2      Track   Laps  11    Minutes  17       Nutrition:  Target Goals: Understanding of nutrition guidelines, daily intake of sodium <1551m, cholesterol <2052m calories 30% from fat and 7% or less from saturated fats, daily to have 5 or more servings of fruits and vegetables.  Biometrics:    Nutrition Therapy Plan and Nutrition Goals: Nutrition Therapy & Goals - 04/27/18 1144      Nutrition Therapy   Diet  consistent carbohydrate heart healthy      Personal Nutrition Goals   Nutrition Goal  Pt to identify and limit sodium, saturated fat, trans fats, refined carbohydrates    Personal Goal #2  Identify food quantities necessary to achieve wt loss of  -2# per week to a goal wt loss of 6-24 lb at graduation from pulmonary rehab.      Intervention Plan   Intervention  Prescribe, educate and counsel regarding individualized specific  dietary modifications aiming towards targeted core components such as weight, hypertension, lipid management, diabetes, heart failure and other comorbidities.    Expected Outcomes  Short Term Goal: Understand basic principles of dietary content, such as calories, fat, sodium, cholesterol and nutrients.       Nutrition Assessments: Nutrition Assessments - 04/27/18 1144      Rate Your Plate Scores   Pre Score  41       Nutrition Goals Re-Evaluation:   Nutrition Goals Discharge (Final Nutrition Goals Re-Evaluation):   Psychosocial: Target Goals: Acknowledge presence or absence of significant depression and/or stress, maximize coping skills, provide positive support system. Participant is able to verbalize types and ability to use techniques and skills needed for reducing stress and depression.  Initial Review & Psychosocial Screening: Initial Psych Review & Screening - 04/20/18 1234      Initial Review   Current issues with  None Identified      Family Dynamics   Good Support System?  Yes      Barriers   Psychosocial barriers to participate in program  There are no identifiable barriers  or psychosocial needs.      Screening Interventions   Interventions  Encouraged to exercise    Expected Outcomes  Short Term goal: Identification and review with participant of any Quality of Life or Depression concerns found by scoring the questionnaire.;Long Term goal: The participant improves quality of Life and PHQ9 Scores as seen by post scores and/or verbalization of changes       Quality of Life Scores:  Scores of 19 and below usually indicate a poorer quality of life in these areas.  A difference of  2-3 points is a clinically meaningful difference.  A difference of 2-3 points in the total score of the Quality of Life Index has been associated with significant improvement in overall quality of life, self-image, physical symptoms, and general health in studies assessing change in quality  of life.  PHQ-9: Recent Review Flowsheet Data    Depression screen [facility] 2/9 01/29/2018   Decreased Interest 0   Down, Depressed, Hopeless 0   PHQ - 2 Score 0     Interpretation of Total Score  Total Score Depression Severity:  1-4 = Minimal depression, 5-9 = Mild depression, 10-14 = Moderate depression, 15-19 = Moderately severe depression, 20-27 = Severe depression   Psychosocial Evaluation and Intervention: Psychosocial Evaluation - 05/18/18 1849      Psychosocial Evaluation & Interventions   Interventions  Encouraged to exercise with the program and follow exercise prescription    Comments  Pt enjoysparticipating and is looking forward to returning.    Expected Outcomes  patient will remain free from psychosocial barriers to participation in pulmonary rehab    Continue Psychosocial Services   Follow up required by staff       Psychosocial Re-Evaluation: Psychosocial Re-Evaluation    [place] Name 05/18/18 1849             Psychosocial Re-Evaluation   Current issues with  None Identified       Expected Outcomes  Continue to display positve and healthy coping skills and hopeful future       Interventions  Encouraged to attend Pulmonary Rehabilitation for the exercise;Relaxation education;Stress management education       Continue Psychosocial Services   Follow up required by staff          Psychosocial Discharge (Final Psychosocial Re-Evaluation): Psychosocial Re-Evaluation - 05/18/18 1849      Psychosocial Re-Evaluation   Current issues with  None Identified    Expected Outcomes  Continue to display positve and healthy coping skills and hopeful future    Interventions  Encouraged to attend Pulmonary Rehabilitation for the exercise;Relaxation education;Stress management education    Continue Psychosocial Services   Follow up required by staff       Education: Education Goals: Education classes will be provided on a weekly basis, covering required topics. Participant will  state understanding/return demonstration of topics presented.  Learning Barriers/Preferences: Learning Barriers/Preferences - 01/29/18 1139      Learning Barriers/Preferences   Learning Barriers  None    Learning Preferences  Written Material;Verbal Instruction;Individual Instruction       Education Topics: Risk Factor Reduction:  -Group instruction that is supported by a PowerPoint presentation. Instructor discusses the definition of a risk factor, different risk factors for pulmonary disease, and how the heart and lungs work together.     Nutrition for Pulmonary Patient:  -Group instruction provided by PowerPoint slides, verbal discussion, and written materials to support subject matter. The instructor gives an explanation and review of healthy  diet recommendations, which includes a discussion on weight management, recommendations for fruit and vegetable consumption, as well as protein, fluid, caffeine, fiber, sodium, sugar, and alcohol. Tips for eating when patients are short of breath are discussed.   Pursed Lip Breathing:  -Group instruction that is supported by demonstration and informational handouts. Instructor discusses the benefits of pursed lip and diaphragmatic breathing and detailed demonstration on how to preform both.     Oxygen Safety:  -Group instruction provided by PowerPoint, verbal discussion, and written material to support subject matter. There is an overview of "What is Oxygen" and "Why do we need it".  Instructor also reviews how to create a safe environment for oxygen use, the importance of using oxygen as prescribed, and the risks of noncompliance. There is a brief discussion on traveling with oxygen and resources the patient may utilize.   Oxygen Equipment:  -Group instruction provided by [facility] Staff utilizing handouts, written materials, and equipment demonstrations.   Signs and Symptoms:  -Group instruction provided by written material and verbal  discussion to support subject matter. Warning signs and symptoms of infection, stroke, and heart attack are reviewed and when to call the physician/911 reinforced. Tips for preventing the spread of infection discussed.   Advanced Directives:  -Group instruction provided by verbal instruction and written material to support subject matter. Instructor reviews Advanced Directive laws and proper instruction for filling out document.   Pulmonary Video:  -Group video education that reviews the importance of medication and oxygen compliance, exercise, good nutrition, pulmonary hygiene, and pursed lip and diaphragmatic breathing for the pulmonary patient.   Exercise for the Pulmonary Patient:  -Group instruction that is supported by a PowerPoint presentation. Instructor discusses benefits of exercise, core components of exercise, frequency, duration, and intensity of an exercise routine, importance of utilizing pulse oximetry during exercise, safety while exercising, and options of places to exercise outside of rehab.     PULMONARY REHAB OTHER RESPIRATORY from 05/04/2018 in [place]  Date  04/29/18  Educator  Cloyde Reams  Instruction Review Code  1- Verbalizes Understanding      Pulmonary Medications:  -Verbally interactive group education provided by instructor with focus on inhaled medications and proper administration.   Anatomy and Physiology of the Respiratory System and Intimacy:  -Group instruction provided by PowerPoint, verbal discussion, and written material to support subject matter. Instructor reviews respiratory cycle and anatomical components of the respiratory system and their functions. Instructor also reviews differences in obstructive and restrictive respiratory diseases with examples of each. Intimacy, Sex, and Sexuality differences are reviewed with a discussion on how relationships can change when diagnosed with pulmonary disease. Common sexual concerns  are reviewed.   MD DAY -A group question and answer session with a medical doctor that allows participants to ask questions that relate to their pulmonary disease state.   PULMONARY REHAB OTHER RESPIRATORY from 05/04/2018 in [place]  Date  05/04/18  Educator  [email or web address]  Instruction Review Code  2- Demonstrated Understanding      OTHER EDUCATION -Group or individual verbal, written, or video instructions that support the educational goals of the pulmonary rehab program.   Holiday Eating Survival Tips:  -Group instruction provided by PowerPoint slides, verbal discussion, and written materials to support subject matter. The instructor gives patients tips, tricks, and techniques to help them not only survive but enjoy the holidays despite the onslaught of food that accompanies the holidays.   Knowledge Questionnaire Score:  Core Components/Risk Factors/Patient Goals at Admission: Personal Goals and Risk Factors at Admission - 01/29/18 1140      Core Components/Risk Factors/Patient Goals on Admission   Improve shortness of breath with ADL's  Yes    Intervention  Provide education, individualized exercise plan and daily activity instruction to help decrease symptoms of SOB with activities of daily living.    Expected Outcomes  Short Term: Improve cardiorespiratory fitness to achieve a reduction of symptoms when performing ADLs;Long Term: Be able to perform more ADLs without symptoms or delay the onset of symptoms    Diabetes  Yes    Intervention  Provide education about signs/symptoms and action to take for hypo/hyperglycemia.;Provide education about proper nutrition, including hydration, and aerobic/resistive exercise prescription along with prescribed medications to achieve blood glucose in normal ranges: Fasting glucose 65-99 mg/dL    Expected Outcomes  Short Term: Participant verbalizes understanding of the signs/symptoms and immediate care of  hyper/hypoglycemia, proper foot care and importance of medication, aerobic/resistive exercise and nutrition plan for blood glucose control.;Long Term: Attainment of HbA1C < 7%.    Heart Failure  Yes    Intervention  Provide a combined exercise and nutrition program that is supplemented with education, support and counseling about heart failure. Directed toward relieving symptoms such as shortness of breath, decreased exercise tolerance, and extremity edema.    Expected Outcomes  Improve functional capacity of life;Short term: Attendance in program 2-3 days a week with increased exercise capacity. Reported lower sodium intake. Reported increased fruit and vegetable intake. Reports medication compliance.;Short term: Daily weights obtained and reported for increase. Utilizing diuretic protocols set by physician.;Long term: Adoption of self-care skills and reduction of barriers for early signs and symptoms recognition and intervention leading to self-care maintenance.       Core Components/Risk Factors/Patient Goals Review:  Goals and Risk Factor Review    Row Name 04/20/18 1232 05/18/18 1850           Core Components/Risk Factors/Patient Goals Review   Personal Goals Review  Improve shortness of breath with ADL's;Weight Management/Obesity;Diabetes  Improve shortness of breath with ADL's;Weight Management/Obesity;Diabetes;Increase knowledge of respiratory medications and ability to use respiratory devices properly.;Develop more efficient breathing techniques such as purse lipped breathing and diaphragmatic breathing and practicing self-pacing with activity.      Review  Pt completed his first day of exercise on 7/23.  Unable to review pt progress toward pt goals  Pt has completed 4 exercise session. Pt absent due to abdominal discomfort and diarrhea.  Pt is hopeful to return soon.  Workloads Nustep level 2, track 11-12 laps and recumbent bike level 1.  Pt weight remains unchanged since beging exercise  with PR.  Pt reports dypnea level 1-2 on exertion.  I anticipate when pt returns he will be able to show meausrable progress toward goals.      Expected Outcomes  See Admission Goals/Outcomes  See Admission Goals/Outcomes         Core Components/Risk Factors/Patient Goals at Discharge (Final Review):  Goals and Risk Factor Review - 05/18/18 1850      Core Components/Risk Factors/Patient Goals Review   Personal Goals Review  Improve shortness of breath with ADL's;Weight Management/Obesity;Diabetes;Increase knowledge of respiratory medications and ability to use respiratory devices properly.;Develop more efficient breathing techniques such as purse lipped breathing and diaphragmatic breathing and practicing self-pacing with activity.    Review  Pt has completed 4 exercise session. Pt absent due to abdominal discomfort and diarrhea.  Pt is hopeful  to return soon.  Workloads Nustep level 2, track 11-12 laps and recumbent bike level 1.  Pt weight remains unchanged since beging exercise with PR.  Pt reports dypnea level 1-2 on exertion.  I anticipate when pt returns he will be able to show meausrable progress toward goals.    Expected Outcomes  See Admission Goals/Outcomes       ITP Comments: ITP Comments    Row Name 04/20/18 1230           ITP Comments  Dr. Jennet Maduro, Medical Director          Comments:  Pt has completed 4 exercise sessions. Cherre Huger, BSN Cardiac and Training and development officer

## 2018-05-20 ENCOUNTER — Encounter (HOSPITAL_COMMUNITY): Payer: Medicare PPO

## 2018-05-20 ENCOUNTER — Telehealth (HOSPITAL_COMMUNITY): Payer: Self-pay | Admitting: Internal Medicine

## 2018-05-25 ENCOUNTER — Encounter (HOSPITAL_COMMUNITY): Payer: Medicare PPO

## 2018-05-25 ENCOUNTER — Telehealth (HOSPITAL_COMMUNITY): Payer: Self-pay | Admitting: *Deleted

## 2018-05-27 ENCOUNTER — Encounter (HOSPITAL_COMMUNITY): Payer: Medicare PPO

## 2018-05-28 DIAGNOSIS — M545 Low back pain: Secondary | ICD-10-CM | POA: Diagnosis not present

## 2018-05-28 DIAGNOSIS — R1013 Epigastric pain: Secondary | ICD-10-CM | POA: Diagnosis not present

## 2018-05-28 DIAGNOSIS — N183 Chronic kidney disease, stage 3 (moderate): Secondary | ICD-10-CM | POA: Diagnosis not present

## 2018-05-28 DIAGNOSIS — I10 Essential (primary) hypertension: Secondary | ICD-10-CM | POA: Diagnosis not present

## 2018-05-28 DIAGNOSIS — F419 Anxiety disorder, unspecified: Secondary | ICD-10-CM | POA: Diagnosis not present

## 2018-05-28 DIAGNOSIS — E785 Hyperlipidemia, unspecified: Secondary | ICD-10-CM | POA: Diagnosis not present

## 2018-05-28 DIAGNOSIS — E118 Type 2 diabetes mellitus with unspecified complications: Secondary | ICD-10-CM | POA: Diagnosis not present

## 2018-05-28 DIAGNOSIS — N400 Enlarged prostate without lower urinary tract symptoms: Secondary | ICD-10-CM | POA: Diagnosis not present

## 2018-05-28 DIAGNOSIS — I2510 Atherosclerotic heart disease of native coronary artery without angina pectoris: Secondary | ICD-10-CM | POA: Diagnosis not present

## 2018-06-01 ENCOUNTER — Encounter (HOSPITAL_COMMUNITY): Payer: Medicare PPO

## 2018-06-03 ENCOUNTER — Encounter (HOSPITAL_COMMUNITY): Payer: Medicare PPO

## 2018-06-08 ENCOUNTER — Encounter (HOSPITAL_COMMUNITY): Payer: Medicare PPO

## 2018-06-09 DIAGNOSIS — H538 Other visual disturbances: Secondary | ICD-10-CM | POA: Diagnosis not present

## 2018-06-09 DIAGNOSIS — E11311 Type 2 diabetes mellitus with unspecified diabetic retinopathy with macular edema: Secondary | ICD-10-CM | POA: Diagnosis not present

## 2018-06-09 DIAGNOSIS — E119 Type 2 diabetes mellitus without complications: Secondary | ICD-10-CM | POA: Diagnosis not present

## 2018-06-10 ENCOUNTER — Encounter (HOSPITAL_COMMUNITY): Payer: Medicare PPO

## 2018-06-14 NOTE — Progress Notes (Signed)
[facility] Note  06/15/2018     CHIEF COMPLAINT Patient presents for Retina Evaluation and Diabetic Eye Exam   HISTORY OF PRESENT ILLNESS: Justin [name] is a 84 y.o. male who presents to the clinic today for:   HPI    Retina Evaluation    In both eyes.  This started 1 year ago.  Associated Symptoms Floaters.  Negative for Blind Spot, Glare, Shoulder/Hip pain, Fatigue, Jaw Claudication, Photophobia, Distortion, Flashes, Pain, Trauma, Fever, Weight Loss, Scalp Tenderness and Redness.  Context:  distance vision, mid-range vision and near vision.  Treatments tried include surgery and injection.  Response to treatment was significant improvement.  I, the attending physician,  performed the HPI with the patient and updated documentation appropriately.          Diabetic Eye Exam    Vision is stable.  Associated Symptoms Negative for Flashes, Pain, Fever, Trauma, Floaters, Redness, Scalp Tenderness, Weight Loss, Fatigue, Jaw Claudication, Photophobia, Distortion, Blind Spot, Glare and Shoulder/Hip pain.  Diabetes characteristics include Type 2, on insulin and taking oral medications.  This started 40 years ago.  Blood sugar level is controlled.  Last Blood Glucose 132.  Last A1C 8.  Associated Diagnosis Neuropathy.  I, the attending physician,  performed the HPI with the patient and updated documentation appropriately.          Comments    Referral of DR. Spencer for DME. Patient states he has been DM2 x 40 yrs , Bs have been WINL, Bs 132 this am, A1c 8.0 appx 3 mos ago.BS controlled with Insulin and Januvia Pt C/O burning,itchy eyes for appx 1 year.Pt reports having Cataract sx Ou 13-14 yrs ago with significant improvement.Pt had Steroid inj Os (appx 2 yrs ago) by Dr. Anabel Bene @ [facility] in [place].       Last edited by [name], MD on 06/15/2018 10:37 AM. (History)    Pt was seen by Dr. [name] prior to this visit, states he was  referred to Dr. [name] by PCP for retinal exam; Pt states he recently moved to [facility] from [place], states he was being followed by Dr. [name]; Pt states he has a history of bleeding behind OU and swelling; Pt states he has history of injections OU; Pt denies any retinal lasers, denies any retinal surgeries; Pt reports he has had cataract sx;   Referring physician: Gevena Cotton, MD [place], [place] [address]  HISTORICAL INFORMATION:   Selected notes from the MEDICAL RECORD NUMBER Referred by Dr. [name] for concern of mac edema OU LEE:  Ocular Hx-pseudo OU PMH-anxiety, arthritis, CAD, DM (taking lantus and januvia), HTN, high cholesterol, prostate cancer    CURRENT MEDICATIONS: No current outpatient medications on file. (Ophthalmic Drugs)   No current facility-administered medications for this visit.  (Ophthalmic Drugs)   Current Outpatient Medications (Other)  Medication Sig  . acetaminophen (TYLENOL) 500 MG tablet Take 1,000 mg by mouth every 6 (six) hours as needed for moderate pain or headache.  . AMITIZA 24 MCG capsule Take 24 mcg by mouth daily as needed for constipation.   Marland Kitchen aspirin EC 81 MG tablet Take 81 mg by mouth daily.  . chlorthalidone (HYGROTON) 25 MG tablet Take 25 mg by mouth daily.   . Cholecalciferol (VITAMIN D3 PO) Take 1 capsule by mouth daily.  . cilostazol (PLETAL) 100 MG tablet Take 100 mg by mouth 2 (two) times daily.  Marland Kitchen  clonazePAM (KLONOPIN) 0.5 MG tablet Take 0.5 mg by mouth daily as needed for anxiety.  . Cyanocobalamin (VITAMIN B-12 PO) Take 1 tablet by mouth daily.  . isosorbide mononitrate (IMDUR) 60 MG 24 hr tablet Take 60 mg by mouth daily.  Marland Kitchen JANUVIA 50 MG tablet Take 50 mg by mouth 2 (two) times daily.  Marland Kitchen LANTUS SOLOSTAR 100 UNIT/ML Solostar Pen Inject 35 Units into the skin 2 (two) times daily.   . metoprolol (LOPRESSOR) 50 MG tablet Take 50 mg by mouth 2 (two) times daily.  . nitroGLYCERIN (NITROSTAT) 0.4 MG SL tablet Place  0.4 mg under the tongue every 5 (five) minutes as needed for chest pain.   Marland Kitchen omeprazole (PRILOSEC) 20 MG capsule Take 20 mg by mouth daily.  . predniSONE (DELTASONE) 20 MG tablet Take 2 tablets daily with breakfast.  . pregabalin (LYRICA) 50 MG capsule Take 50 mg by mouth 3 (three) times daily.  . Pyridoxine HCl (VITAMIN B-6 PO) Take 1 tablet by mouth daily.  . simvastatin (ZOCOR) 20 MG tablet Take 20 mg by mouth every evening.  . tamsulosin (FLOMAX) 0.4 MG CAPS capsule Take 0.4 mg by mouth daily.   Current Facility-Administered Medications (Other)  Medication Route  . Bevacizumab (AVASTIN) SOLN 1.25 mg Intravitreal      REVIEW OF SYSTEMS: ROS    Positive for: Endocrine, Eyes   Negative for: Constitutional, Gastrointestinal, Neurological, Skin, Genitourinary, Musculoskeletal, HENT, Cardiovascular, Respiratory, Psychiatric, Allergic/Imm, Heme/Lymph   Last edited by Zenovia Jordan, LPN on 12/29/270  5:36 AM. (History)       ALLERGIES Allergies  Allergen Reactions  . Dye Fdc Red [Red Dye] Swelling and Other (See Comments)    CAT scan dye  . Ibuprofen Other (See Comments)    Upset stomach     PAST MEDICAL HISTORY Past Medical History:  Diagnosis Date  . Anxiety   . Arthritis   . Coronary artery disease   . Diabetes mellitus without complication ([place])   . Diverticulitis   . Dyspnea   . GERD (gastroesophageal reflux disease)   . Hypercholesteremia   . Hypertension   . Pneumonia    as a child  . Prostate cancer ([place])   . UTI (lower urinary tract infection)    Past Surgical History:  Procedure Laterality Date  . ABDOMINAL SURGERY     for diverticulitis; also removed appendix  . CATARACT EXTRACTION    . CATARACT EXTRACTION, BILATERAL    . CHOLECYSTECTOMY N/A 10/12/2017   Procedure: LAPAROSCOPIC CHOLECYSTECTOMY;  Surgeon: [name], MD;  Location: [facility];  Service: General;  Laterality: N/A;  . CORONARY ARTERY BYPASS GRAFT  2009  . EYE SURGERY     "for  bleeding in eye"  . HERNIA REPAIR    . LEFT HEART CATH AND CORONARY ANGIOGRAPHY N/A 11/04/2016   Procedure: Left Heart Cath and Coronary Angiography;  Surgeon: [name], MD;  Location: [facility];  Service: Cardiovascular;  Laterality: N/A;  . LOWER EXTREMITY ANGIOGRAPHY N/A 11/04/2016   Procedure: Lower Extremity Angiography;  Surgeon: [name], MD;  Location: [facility];  Service: Cardiovascular;  Laterality: N/A;  . PROSTATE SURGERY      FAMILY HISTORY Family History  Problem Relation Age of Onset  . Diabetes Father   . Diabetes Maternal Aunt   . Diabetes Maternal Uncle   . Diabetes Maternal Grandmother     SOCIAL HISTORY Social History   Tobacco Use  . Smoking status: Former Research scientist (life sciences)  . Smokeless  tobacco: Never Used  Substance Use Topics  . Alcohol use: No  . Drug use: No         OPHTHALMIC EXAM:  Base Eye Exam    Visual Acuity (Snellen - Linear)      Right Left   Dist cc 20/50 +1 20/50   Dist ph cc 20/40 NI   Correction:  Glasses       Tonometry (Tonopen, 9:09 AM)      Right Left   Pressure 15 17       Pupils      Dark Light Shape React APD   Right 4 3 Round Brisk None   Left 4 3 Round Brisk None       Visual Fields (Counting fingers)      Left Right    Full Full       Extraocular Movement      Right Left    Full, Ortho Full, Ortho       Neuro/Psych    Oriented x3:  Yes   Mood/Affect:  Normal       Dilation    Both eyes:  1.0% Mydriacyl, 2.5% Phenylephrine @ 9:09 AM       Dilation #2    Both eyes:  1.0% Mydriacyl, 2.5% Phenylephrine @ 9:35 AM        Slit Lamp and Fundus Exam    Slit Lamp Exam      Right Left   Lids/Lashes Dermatochalasis - upper lid, Meibomian gland dysfunction Dermatochalasis - upper lid, Meibomian gland dysfunction   Conjunctiva/Sclera Melanosis Melanosis   Cornea Arcus, 1+ Punctate epithelial erosions Arcus, 1+ Punctate epithelial erosions   Anterior Chamber Deep and quiet Deep and quiet   Iris  Mild Temporal Iris atrophy, Round and dilated, No NVI Mild Temporal Iris atrophy, Round and dilated, No NVI   Lens PC IOL in good position with mild aterior capsule fimosis PC IOL in good position with mild aterior capsule fimosis   Vitreous Vitreous syneresis, Posterior vitreous detachment Vitreous syneresis, refractile deposits in posterior hyaloid, mild Asteroid hyalosis       Fundus Exam      Right Left   Disc Pink and Sharp, Temporal Peripapillary atrophy Tilted disc, Temporal Peripapillary atrophy   C/D Ratio 0.3 0.3   Macula Blunted foveal reflex, Retinal pigment epithelial mottling, Microaneurysms, +edema Blunted foveal reflex, focal area of RPE atrophy SN to fovea, Microaneurysms, Retinal pigment epithelial mottling   Vessels Dilation, Tortuous Dilation, Tortuous   Periphery Attached, scattered MA Attached, scattered DBH        Refraction    Wearing Rx      Sphere Cylinder Axis Add   Right +0.25 +0.50 010 +3.00   Left [place] +0.75 173 +3.00   Age:  8 yrs   Type:  Bifocal       Manifest Refraction (Over)      Sphere Cylinder Axis Dist VA   Right -0.25 +0.75 167 20/30   Left -0.50 +0.50 167 20/40          IMAGING AND PROCEDURES  Imaging and Procedures for _0 @  OCT, Retina - OU - Both Eyes       Right Eye Quality was good. Central Foveal Thickness: 341. Progression has no prior data. Findings include abnormal foveal contour, intraretinal fluid, no SRF (Scattered ORA, rare drusen).   Left Eye Quality was good. Central Foveal Thickness: 288. Progression has no prior data. Findings include abnormal foveal contour, no SRF, intraretinal fluid,  outer retinal atrophy.   Notes *Images captured and stored on drive  Diagnosis / Impression:  DME OU, OD>OS  Clinical management:  See below  Abbreviations: NFP - Normal foveal profile. CME - cystoid macular edema. PED - pigment epithelial detachment. IRF - intraretinal fluid. SRF - subretinal fluid. EZ - ellipsoid  zone. ERM - epiretinal membrane. ORA - outer retinal atrophy. ORT - outer retinal tubulation. SRHM - subretinal hyper-reflective material         Intravitreal Injection, Pharmacologic Agent - OD - Right Eye       Time Out 06/15/2018. 10:33 AM. Confirmed correct patient, procedure, site, and patient consented.   Anesthesia Topical anesthesia was used. Anesthetic medications included Lidocaine 2%, Tetracaine 0.5%.   Procedure Preparation included 5% betadine to ocular surface, eyelid speculum. A supplied needle was used.   Injection:  1.25 mg Bevacizumab 1.66m/0.05ml   NDC: 50242-060-01, Lot: [id number] , Expiration date: 07/21/2018   Route: Intravitreal, Site: Right Eye, Waste: 0 mL  Post-op Post injection exam found visual acuity of at least counting fingers. The patient tolerated the procedure well. There were no complications. The patient received written and verbal post procedure care education.                 ASSESSMENT/PLAN:    ICD-10-CM   1. Moderate nonproliferative diabetic retinopathy of both eyes with macular edema associated with type 2 diabetes mellitus (HCC) EV37.1062Intravitreal Injection, Pharmacologic Agent - OD - Right Eye    Bevacizumab (AVASTIN) SOLN 1.25 mg  2. Retinal edema H35.81 OCT, Retina - OU - Both Eyes  3. Essential hypertension I10   4. Hypertensive retinopathy of both eyes H35.033   5. Pseudophakia of both eyes Z96.1     1,2. Moderate Non-proliferative diabetic retinopathy with edema OU [place] history of prior injections OS with Dr. OSharlyn Bologna- The incidence, risk factors for progression, natural history and treatment options for diabetic retinopathy  were discussed with patient.   - The need for close monitoring of blood glucose, blood pressure, and serum lipids, avoiding cigarette or any type of tobacco, and the need for long term follow up was also discussed with patient. - exam scattered IRH, DBH and  +macular edema - OCT shows diabetic macular edema, both eyes, (OD > OS)  The natural history, pathology, and characteristics of diabetic macular edema discussed with patient.  A generalized discussion of the major clinical trials concerning treatment of diabetic macular edema (ETDRS, DCT, SCORE, RISE / RIDE, and ongoing DRCR net studies) was completed.  This discussion included mention of the various approaches to treating diabetic macular edema (observation, laser photocoagulation, anti-VEGF injections with lucentis / Avastin / Eylea, steroid injections with Kenalog / Ozurdex, and intraocular surgery with vitrectomy).  The goal hemoglobin A1C of 6-7 was discussed, as well as importance of smoking cessation and hypertension control.  Need for ongoing treatment and monitoring were specifically discussed with reference to chronic nature of diabetic macular edema. - recommend IVA #1 OD today, 09.17.19 - pt wishes to proceed - RBA of procedure discussed, questions answered - informed consent obtained and signed - see procedure note - release of information form signed to obtain records from Dr. OAlonna Buckleroffice -- [place]- f/u in 1-2 weeks, DFE, OCT, FA (Optos, transit OS), possible injxn OS  3,4. Hypertensive retinopathy OU - discussed importance of tight BP control - monitor  5. Pseudophakia OU  - s/p CE/IOL OU in  [place], [place] beautiful surgery, doing well  - monitor   Ophthalmic Meds Ordered this visit:  Meds ordered this encounter  Medications  . Bevacizumab (AVASTIN) SOLN 1.25 mg       Return in about 2 weeks (around 06/29/2018) for F/U NPDR OU, DFE, OCT, FA.  There are no Patient Instructions on file for this visit.   Explained the diagnoses, plan, and follow up with the patient and they expressed understanding.  Patient expressed understanding of the importance of proper follow up care.   This document serves as a record of services personally performed  by Gardiner Sleeper, MD, PhD. It was created on their behalf by Ernest Mallick, OA, an ophthalmic assistant. The creation of this record is the provider's dictation and/or activities during the visit.    Electronically signed by: Ernest Mallick, OA  09.16.19 11:02 PM    Gardiner Sleeper, M.D., Ph.D. Diseases & Surgery of the Retina and Vitreous [facility]   I have reviewed the above documentation for accuracy and completeness, and I agree with the above. Gardiner Sleeper, M.D., Ph.D. 06/16/18 11:08 PM     Abbreviations: M myopia (nearsighted); A astigmatism; H hyperopia (farsighted); P presbyopia; Mrx spectacle prescription;  CTL contact lenses; OD right eye; OS left eye; OU both eyes  XT exotropia; ET esotropia; PEK punctate epithelial keratitis; PEE punctate epithelial erosions; DES dry eye syndrome; MGD meibomian gland dysfunction; ATs artificial tears; PFAT's preservative free artificial tears; [place] nuclear sclerotic cataract; PSC posterior subcapsular cataract; ERM epi-retinal membrane; PVD posterior vitreous detachment; RD retinal detachment; DM diabetes mellitus; DR diabetic retinopathy; NPDR non-proliferative diabetic retinopathy; PDR proliferative diabetic retinopathy; CSME clinically significant macular edema; DME diabetic macular edema; dbh dot blot hemorrhages; CWS cotton wool spot; POAG primary open angle glaucoma; C/D cup-to-disc ratio; HVF humphrey visual field; GVF goldmann visual field; OCT optical coherence tomography; IOP intraocular pressure; BRVO Branch retinal vein occlusion; CRVO central retinal vein occlusion; CRAO central retinal artery occlusion; BRAO branch retinal artery occlusion; RT retinal tear; SB scleral buckle; PPV pars plana vitrectomy; VH Vitreous hemorrhage; PRP panretinal laser photocoagulation; IVK intravitreal kenalog; VMT vitreomacular traction; MH Macular hole;  NVD neovascularization of the disc; NVE neovascularization elsewhere; AREDS age  related eye disease study; ARMD age related macular degeneration; POAG primary open angle glaucoma; EBMD epithelial/anterior basement membrane dystrophy; ACIOL anterior chamber intraocular lens; IOL intraocular lens; PCIOL posterior chamber intraocular lens; Phaco/IOL phacoemulsification with intraocular lens placement; [place] photorefractive keratectomy; LASIK laser assisted in situ keratomileusis; HTN hypertension; DM diabetes mellitus; COPD chronic obstructive pulmonary disease

## 2018-06-15 ENCOUNTER — Encounter (HOSPITAL_COMMUNITY): Payer: Medicare PPO

## 2018-06-15 ENCOUNTER — Ambulatory Visit (INDEPENDENT_AMBULATORY_CARE_PROVIDER_SITE_OTHER): Payer: Medicare PPO | Admitting: Ophthalmology

## 2018-06-15 ENCOUNTER — Encounter (INDEPENDENT_AMBULATORY_CARE_PROVIDER_SITE_OTHER): Payer: Self-pay | Admitting: Ophthalmology

## 2018-06-15 DIAGNOSIS — Z961 Presence of intraocular lens: Secondary | ICD-10-CM

## 2018-06-15 DIAGNOSIS — I10 Essential (primary) hypertension: Secondary | ICD-10-CM

## 2018-06-15 DIAGNOSIS — E113313 Type 2 diabetes mellitus with moderate nonproliferative diabetic retinopathy with macular edema, bilateral: Secondary | ICD-10-CM | POA: Diagnosis not present

## 2018-06-15 DIAGNOSIS — H35033 Hypertensive retinopathy, bilateral: Secondary | ICD-10-CM | POA: Diagnosis not present

## 2018-06-15 DIAGNOSIS — H3581 Retinal edema: Secondary | ICD-10-CM

## 2018-06-15 MED ORDER — BEVACIZUMAB CHEMO INJECTION 1.25MG/0.05ML SYRINGE FOR KALEIDOSCOPE
1.2500 mg | INTRAVITREAL | Status: DC
  Administered 2018-06-15: 1.25 mg via INTRAVITREAL

## 2018-06-16 ENCOUNTER — Encounter (INDEPENDENT_AMBULATORY_CARE_PROVIDER_SITE_OTHER): Payer: Self-pay | Admitting: Ophthalmology

## 2018-06-17 ENCOUNTER — Encounter (HOSPITAL_COMMUNITY): Payer: Medicare PPO

## 2018-06-22 ENCOUNTER — Encounter (HOSPITAL_COMMUNITY): Payer: Medicare PPO

## 2018-06-23 DIAGNOSIS — R1013 Epigastric pain: Secondary | ICD-10-CM | POA: Diagnosis not present

## 2018-06-23 DIAGNOSIS — F419 Anxiety disorder, unspecified: Secondary | ICD-10-CM | POA: Diagnosis not present

## 2018-06-23 DIAGNOSIS — N400 Enlarged prostate without lower urinary tract symptoms: Secondary | ICD-10-CM | POA: Diagnosis not present

## 2018-06-23 DIAGNOSIS — N183 Chronic kidney disease, stage 3 (moderate): Secondary | ICD-10-CM | POA: Diagnosis not present

## 2018-06-23 DIAGNOSIS — M545 Low back pain: Secondary | ICD-10-CM | POA: Diagnosis not present

## 2018-06-23 DIAGNOSIS — I10 Essential (primary) hypertension: Secondary | ICD-10-CM | POA: Diagnosis not present

## 2018-06-23 DIAGNOSIS — I2510 Atherosclerotic heart disease of native coronary artery without angina pectoris: Secondary | ICD-10-CM | POA: Diagnosis not present

## 2018-06-23 DIAGNOSIS — E118 Type 2 diabetes mellitus with unspecified complications: Secondary | ICD-10-CM | POA: Diagnosis not present

## 2018-06-23 DIAGNOSIS — D509 Iron deficiency anemia, unspecified: Secondary | ICD-10-CM | POA: Diagnosis not present

## 2018-06-24 ENCOUNTER — Encounter (HOSPITAL_COMMUNITY): Payer: Medicare PPO

## 2018-06-24 DIAGNOSIS — I2510 Atherosclerotic heart disease of native coronary artery without angina pectoris: Secondary | ICD-10-CM | POA: Diagnosis not present

## 2018-06-24 DIAGNOSIS — R0602 Shortness of breath: Secondary | ICD-10-CM | POA: Diagnosis not present

## 2018-06-24 DIAGNOSIS — I739 Peripheral vascular disease, unspecified: Secondary | ICD-10-CM | POA: Diagnosis not present

## 2018-06-24 DIAGNOSIS — E1142 Type 2 diabetes mellitus with diabetic polyneuropathy: Secondary | ICD-10-CM | POA: Diagnosis not present

## 2018-06-24 DIAGNOSIS — I5032 Chronic diastolic (congestive) heart failure: Secondary | ICD-10-CM | POA: Diagnosis not present

## 2018-06-28 NOTE — Progress Notes (Signed)
[facility] Note  06/29/2018     CHIEF COMPLAINT Patient presents for Retina Follow Up   HISTORY OF PRESENT ILLNESS: Rosevelt Luu is a 84 y.o. male who presents to the clinic today for:   HPI    Retina Follow Up    Patient presents with  Diabetic Retinopathy.  In both eyes.  Severity is moderate.  Duration of 2 weeks.  Since onset it is stable.  I, the attending physician,  performed the HPI with the patient and updated documentation appropriately.          Comments    Pt presents for NPDR OU f/u, pt states VA has been pretty good, pt denies flashes, flaoters, pain or wavy vision, pt denies the use of gtts, pts blood sugar was 130 this AM       Last edited by [name], MD on 06/29/2018 10:18 AM. (History)    Pt was seen by Dr. [name] prior to this visit, states he was referred to Dr. [name] by PCP for retinal exam; Pt states he recently moved to [facility] from [place], states he was being followed by Dr. [name]; Pt states he has a history of bleeding behind OU and swelling; Pt states he has history of injections OU; Pt denies any retinal lasers, denies any retinal surgeries; Pt reports he has had cataract sx;   Referring physician: [name], MD [address] [place], [place] [address]  HISTORICAL INFORMATION:   Selected notes from the MEDICAL RECORD NUMBER Referred by Dr. [name] for concern of mac edema OU LEE:  Ocular Hx-pseudo OU; formerly followed at [facility] Dr. [name]. S/p focal laser OS x2, history of IVK and IVT OU PMH-anxiety, arthritis, CAD, DM (taking lantus and januvia), HTN, high cholesterol, prostate cancer    CURRENT MEDICATIONS: No current outpatient medications on file. (Ophthalmic Drugs)   No current facility-administered medications for this visit.  (Ophthalmic Drugs)   Current Outpatient Medications (Other)  Medication Sig  . acetaminophen (TYLENOL) 500 MG tablet Take 1,000 mg by  mouth every 6 (six) hours as needed for moderate pain or headache.  . AMITIZA 24 MCG capsule Take 24 mcg by mouth daily as needed for constipation.   Marland Kitchen aspirin EC 81 MG tablet Take 81 mg by mouth daily.  . chlorthalidone (HYGROTON) 25 MG tablet Take 25 mg by mouth daily.   . Cholecalciferol (VITAMIN D3 PO) Take 1 capsule by mouth daily.  . cilostazol (PLETAL) 100 MG tablet Take 100 mg by mouth 2 (two) times daily.  . clonazePAM (KLONOPIN) 0.5 MG tablet Take 0.5 mg by mouth daily as needed for anxiety.  . Cyanocobalamin (VITAMIN B-12 PO) Take 1 tablet by mouth daily.  . DULoxetine HCl 40 MG CPEP Take 1 capsule by mouth 2 (two) times daily.  . isosorbide mononitrate (IMDUR) 60 MG 24 hr tablet Take 60 mg by mouth daily.  Marland Kitchen JANUVIA 50 MG tablet Take 50 mg by mouth 2 (two) times daily.  Marland Kitchen LANTUS SOLOSTAR 100 UNIT/ML Solostar Pen Inject 35 Units into the skin 2 (two) times daily.   . metoprolol (LOPRESSOR) 50 MG tablet Take 50 mg by mouth 2 (two) times daily.  . nitroGLYCERIN (NITROSTAT) 0.4 MG SL tablet Place 0.4 mg under the tongue every 5 (five) minutes as needed for chest pain.   Marland Kitchen omeprazole (PRILOSEC) 20 MG capsule Take 20 mg by mouth daily.  . predniSONE (DELTASONE) 20 MG tablet  Take 2 tablets daily with breakfast.  . pregabalin (LYRICA) 50 MG capsule Take 50 mg by mouth 3 (three) times daily.  . Pyridoxine HCl (VITAMIN B-6 PO) Take 1 tablet by mouth daily.  . rosuvastatin (CRESTOR) 20 MG tablet TAKE 1 TABLET BY MOUTH ONCE DAILY DISCONTINUE SIMVASTATIN  . simvastatin (ZOCOR) 20 MG tablet Take 20 mg by mouth every evening.  . tamsulosin (FLOMAX) 0.4 MG CAPS capsule Take 0.4 mg by mouth daily.   Current Facility-Administered Medications (Other)  Medication Route  . Bevacizumab (AVASTIN) SOLN 1.25 mg Intravitreal  . Bevacizumab (AVASTIN) SOLN 1.25 mg Intravitreal      REVIEW OF SYSTEMS: ROS    Positive for: Musculoskeletal, Endocrine, Cardiovascular, Eyes, Psychiatric   Negative for:  Constitutional, Gastrointestinal, Neurological, Skin, Genitourinary, HENT, Respiratory, Allergic/Imm, Heme/Lymph   Last edited by Debbrah Alar, COT on 06/29/2018  9:03 AM. (History)       ALLERGIES Allergies  Allergen Reactions  . Dye Fdc Red [Red Dye] Swelling and Other (See Comments)    CAT scan dye  . Ibuprofen Other (See Comments)    Upset stomach     PAST MEDICAL HISTORY Past Medical History:  Diagnosis Date  . Anxiety   . Arthritis   . Coronary artery disease   . Diabetes mellitus without complication ([place])   . Diverticulitis   . Dyspnea   . GERD (gastroesophageal reflux disease)   . Hypercholesteremia   . Hypertension   . Pneumonia    as a child  . Prostate cancer ([place])   . UTI (lower urinary tract infection)    Past Surgical History:  Procedure Laterality Date  . ABDOMINAL SURGERY     for diverticulitis; also removed appendix  . CATARACT EXTRACTION    . CATARACT EXTRACTION, BILATERAL    . CHOLECYSTECTOMY N/A 10/12/2017   Procedure: LAPAROSCOPIC CHOLECYSTECTOMY;  Surgeon: [name], MD;  Location: [facility];  Service: General;  Laterality: N/A;  . CORONARY ARTERY BYPASS GRAFT  2009  . EYE SURGERY     "for bleeding in eye"  . HERNIA REPAIR    . LEFT HEART CATH AND CORONARY ANGIOGRAPHY N/A 11/04/2016   Procedure: Left Heart Cath and Coronary Angiography;  Surgeon: [name], MD;  Location: [facility];  Service: Cardiovascular;  Laterality: N/A;  . LOWER EXTREMITY ANGIOGRAPHY N/A 11/04/2016   Procedure: Lower Extremity Angiography;  Surgeon: [name], MD;  Location: [facility];  Service: Cardiovascular;  Laterality: N/A;  . PROSTATE SURGERY      FAMILY HISTORY Family History  Problem Relation Age of Onset  . Diabetes Father   . Diabetes Maternal Aunt   . Diabetes Maternal Uncle   . Diabetes Maternal Grandmother     SOCIAL HISTORY Social History   Tobacco Use  . Smoking status: Former Research scientist (life sciences)  . Smokeless tobacco: Never Used   Substance Use Topics  . Alcohol use: No  . Drug use: No         OPHTHALMIC EXAM:  Base Eye Exam    Visual Acuity (Snellen - Linear)      Right Left   Dist cc 20/25 -1 20/50 +1   Dist ph cc NI NI       Tonometry (Tonopen, 9:09 AM)      Right Left   Pressure 21 25       Tonometry #2      Right Left   Pressure  25       Pupils  Dark Light Shape React APD   Right 2 1.5 Round Minimal None   Left 2 1.5 Round Minimal None       Visual Fields (Counting fingers)      Left Right    Full Full       Extraocular Movement      Right Left    Full, Ortho Full, Ortho       Neuro/Psych    Oriented x3:  Yes   Mood/Affect:  Normal       Dilation    Both eyes:  1.0% Mydriacyl, 2.5% Phenylephrine @ 9:09 AM        Slit Lamp and Fundus Exam    Slit Lamp Exam      Right Left   Lids/Lashes Dermatochalasis - upper lid, Meibomian gland dysfunction Dermatochalasis - upper lid, Meibomian gland dysfunction   Conjunctiva/Sclera Melanosis Melanosis   Cornea Arcus, 1+ Punctate epithelial erosions Arcus, 1+ Punctate epithelial erosions   Anterior Chamber Deep and quiet Deep and quiet   Iris Mild Temporal Iris atrophy, Round and dilated, No NVI Mild Temporal Iris atrophy, Round and dilated, No NVI   Lens PC IOL in good position with mild aterior capsule fimosis PC IOL in good position with mild aterior capsule fimosis   Vitreous Vitreous syneresis, Posterior vitreous detachment Vitreous syneresis, refractile deposits in posterior hyaloid, mild Asteroid hyalosis       Fundus Exam      Right Left   Disc Pink and Sharp, Temporal Peripapillary atrophy Tilted disc, Temporal Peripapillary atrophy   C/D Ratio 0.3 0.3   Macula Blunted foveal reflex, Retinal pigment epithelial mottling, Microaneurysms, +edema Blunted foveal reflex, focal area of RPE atrophy SN to fovea, Microaneurysms, Retinal pigment epithelial mottling, Epiretinal membrane   Vessels Dilation, Tortuous Dilation,  Tortuous   Periphery Attached, scattered MA Attached, scattered DBH          IMAGING AND PROCEDURES  Imaging and Procedures for @TODAY @  OCT, Retina - OU - Both Eyes       Right Eye Quality was good. Central Foveal Thickness: 322. Findings include abnormal foveal contour, intraretinal fluid, no SRF (Scattered ORA, rare drusen).   Left Eye Quality was good. Central Foveal Thickness: 287. Findings include abnormal foveal contour, no SRF, intraretinal fluid, outer retinal atrophy, retinal drusen .   Notes *Images captured and stored on drive  Diagnosis / Impression:  DME OU, OD>OS OS w/ focal areas of outer retinal atrophy  Clinical management:  See below  Abbreviations: NFP - Normal foveal profile. CME - cystoid macular edema. PED - pigment epithelial detachment. IRF - intraretinal fluid. SRF - subretinal fluid. EZ - ellipsoid zone. ERM - epiretinal membrane. ORA - outer retinal atrophy. ORT - outer retinal tubulation. SRHM - subretinal hyper-reflective material         Fluorescein Angiography Optos (Transit OS)       Right Eye   Progression has no prior data. Early phase findings include microaneurysm. Mid/Late phase findings include microaneurysm, leakage.   Left Eye   Progression has no prior data. Early phase findings include microaneurysm, window defect, staining. Mid/Late phase findings include microaneurysm, leakage, window defect, staining.   Notes Images captured and stored on drive;   Impression: Moderate NPDR OU Late leaking MAs OU No NV OU OS with window defect and staining consistent with history of focal laser        Intravitreal Injection, Pharmacologic Agent - OS - Left Eye  Time Out 06/29/2018. 10:45 AM. Confirmed correct patient, procedure, site, and patient consented.   Anesthesia Topical anesthesia was used. Anesthetic medications included Lidocaine 2%, Proparacaine 0.5%.   Procedure Preparation included 5% betadine to  ocular surface, eyelid speculum. A 30 gauge needle was used.   Injection:  1.25 mg Bevacizumab 1.77m/0.05ml   NDC: 516109-604-54 Lot: [id number] , Expiration date: 07/31/2018   Route: Intravitreal, Site: Left Eye, Waste: 0 mg  Post-op Post injection exam found visual acuity of at least counting fingers. The patient tolerated the procedure well. There were no complications. The patient received written and verbal post procedure care education.                 ASSESSMENT/PLAN:    ICD-10-CM   1. Moderate nonproliferative diabetic retinopathy of both eyes with macular edema associated with type 2 diabetes mellitus (HCC) E24/2/2019OCT, Retina - OU - Both Eyes    Fluorescein Angiography Optos (Transit OS)    Intravitreal Injection, Pharmacologic Agent - OS - Left Eye    Bevacizumab (AVASTIN) SOLN 1.25 mg  2. Retinal edema H35.81 OCT, Retina - OU - Both Eyes    Fluorescein Angiography Optos (Transit OS)  3. Essential hypertension I10   4. Hypertensive retinopathy of both eyes H35.033   5. Pseudophakia of both eyes Z96.1     1,2. Moderate Non-proliferative diabetic retinopathy with edema OU - [place] of [place] [facility] [place] - history of focal laser OS x2 and IVK/IVT OU in [place]-- last injection was IVK OS November 2013 - exam scattered IRH, DBH and +macular edema - OCT shows diabetic macular edema, both eyes, (OD > OS) - S/P IVA OD #1 (09.17.19)  - FA today (10.1.19) shows late leaking MA OU -- no NV - recommend IVA OS #1 today (10.01.19) - pt wishes to proceed - RBA of procedure discussed, questions answered - informed consent obtained and signed - see procedure note - f/u in 4 weeks, DFE, OCT - plan for bilateral injections at that time if needed  3,4. Hypertensive retinopathy OU - discussed importance of tight BP control - monitor  5.  Pseudophakia OU  - [place] beautiful [place], doing well  - monitor   Ophthalmic Meds Ordered this visit:  Meds ordered this encounter  Medications  . Bevacizumab (AVASTIN) SOLN 1.25 mg       Return in about 4 weeks (around 07/27/2018) for F/U NPDR OU, DFE, OCT.  There are no Patient Instructions on file for this visit.   Explained the diagnoses, plan, and follow up with the patient and they expressed understanding.  Patient expressed understanding of the importance of proper follow up care.   This document serves as a record of services personally performed by B11/07/2018 MD, PhD. It was created on their behalf by MGardiner Sleeper CElsie a certified ophthalmic assistant. The creation of this record is the provider's dictation and/or activities during the visit.  Electronically signed by: [id number] COA  09.30.19 1:18 PM    [id number] M.D., Ph.D. Diseases & Surgery of the Retina and Vitreous [facility]  I have reviewed the above documentation for accuracy and completeness, and I agree with the above. B3Er [facility] M.D., Ph.D. 06/29/18 1:24 PM   Abbreviations: M myopia (nearsighted); A astigmatism; H hyperopia (farsighted);  P presbyopia; Mrx spectacle prescription;  CTL contact lenses; OD right eye; OS left eye; OU both eyes  XT exotropia; ET esotropia; PEK punctate epithelial keratitis; PEE punctate epithelial erosions; DES dry eye syndrome; MGD meibomian gland dysfunction; ATs artificial tears; PFAT's preservative free artificial tears; [place] nuclear sclerotic cataract; PSC posterior subcapsular cataract; ERM epi-retinal membrane; PVD posterior vitreous detachment; RD retinal detachment; DM diabetes mellitus; DR diabetic retinopathy; NPDR non-proliferative diabetic retinopathy; PDR proliferative diabetic retinopathy; CSME clinically significant macular edema; DME diabetic macular edema; dbh dot blot hemorrhages;  CWS cotton wool spot; POAG primary open angle glaucoma; C/D cup-to-disc ratio; HVF humphrey visual field; GVF goldmann visual field; OCT optical coherence tomography; IOP intraocular pressure; BRVO Branch retinal vein occlusion; CRVO central retinal vein occlusion; CRAO central retinal artery occlusion; BRAO branch retinal artery occlusion; RT retinal tear; SB scleral buckle; PPV pars plana vitrectomy; VH Vitreous hemorrhage; PRP panretinal laser photocoagulation; IVK intravitreal kenalog; VMT vitreomacular traction; MH Macular hole;  NVD neovascularization of the disc; NVE neovascularization elsewhere; AREDS age related eye disease study; ARMD age related macular degeneration; POAG primary open angle glaucoma; EBMD epithelial/anterior basement membrane dystrophy; ACIOL anterior chamber intraocular lens; IOL intraocular lens; PCIOL posterior chamber intraocular lens; Phaco/IOL phacoemulsification with intraocular lens placement; [place] photorefractive keratectomy; LASIK laser assisted in situ keratomileusis; HTN hypertension; DM diabetes mellitus; COPD chronic obstructive pulmonary disease

## 2018-06-29 ENCOUNTER — Encounter (INDEPENDENT_AMBULATORY_CARE_PROVIDER_SITE_OTHER): Payer: Self-pay | Admitting: Ophthalmology

## 2018-06-29 ENCOUNTER — Ambulatory Visit (INDEPENDENT_AMBULATORY_CARE_PROVIDER_SITE_OTHER): Payer: Medicare PPO | Admitting: Ophthalmology

## 2018-06-29 ENCOUNTER — Encounter (HOSPITAL_COMMUNITY): Payer: Medicare PPO

## 2018-06-29 DIAGNOSIS — E113313 Type 2 diabetes mellitus with moderate nonproliferative diabetic retinopathy with macular edema, bilateral: Secondary | ICD-10-CM

## 2018-06-29 DIAGNOSIS — Z961 Presence of intraocular lens: Secondary | ICD-10-CM

## 2018-06-29 DIAGNOSIS — I10 Essential (primary) hypertension: Secondary | ICD-10-CM

## 2018-06-29 DIAGNOSIS — H3581 Retinal edema: Secondary | ICD-10-CM

## 2018-06-29 DIAGNOSIS — H35033 Hypertensive retinopathy, bilateral: Secondary | ICD-10-CM | POA: Diagnosis not present

## 2018-06-29 MED ORDER — BEVACIZUMAB CHEMO INJECTION 1.25MG/0.05ML SYRINGE FOR KALEIDOSCOPE
1.2500 mg | INTRAVITREAL | Status: DC
  Administered 2018-06-29: 1.25 mg via INTRAVITREAL

## 2018-07-01 ENCOUNTER — Encounter (HOSPITAL_COMMUNITY): Payer: Medicare PPO

## 2018-07-02 NOTE — Addendum Note (Signed)
Encounter addended by: Ivonne Andrew, RD on: 07/02/2018 7:59 AM  Actions taken: Flowsheet data copied forward, Visit Navigator Flowsheet section accepted

## 2018-07-06 ENCOUNTER — Encounter (HOSPITAL_COMMUNITY): Payer: Medicare PPO

## 2018-07-08 ENCOUNTER — Encounter (HOSPITAL_COMMUNITY): Payer: Medicare PPO

## 2018-07-13 ENCOUNTER — Encounter (HOSPITAL_COMMUNITY): Payer: Medicare PPO

## 2018-07-15 ENCOUNTER — Encounter (HOSPITAL_COMMUNITY): Payer: Medicare PPO

## 2018-07-20 ENCOUNTER — Encounter (HOSPITAL_COMMUNITY): Payer: Medicare PPO

## 2018-07-22 ENCOUNTER — Encounter (HOSPITAL_COMMUNITY): Payer: Medicare PPO

## 2018-07-26 NOTE — Progress Notes (Signed)
[facility] Note  07/27/2018     CHIEF COMPLAINT Patient presents for Retina Follow Up   HISTORY OF PRESENT ILLNESS: Justin [name] is a 84 y.o. male who presents to the clinic today for:   HPI    Retina Follow Up    Patient presents with  Diabetic Retinopathy.  In both eyes.  This started months ago.  Severity is moderate.  Duration of months.  Since onset it is gradually improving.  I, the attending physician,  performed the HPI with the patient and updated documentation appropriately.          Comments    84 y/o male pt here for 4 wk f/u for NPDR w/edema OU.  No change in [place] OU.  Denies pain, flashes, floaters.  No gtts.  BS 130 this a.m.  A1C unknown.       Last edited by [name], MD on 07/28/2018  9:02 AM. (History)    pt states his vision feels like it has gotten a little better since last visit, pt states he is blood pressure is controlled by medication  Referring physician: [name], MD [address], [place] [address]  HISTORICAL INFORMATION:   Selected notes from the MEDICAL RECORD NUMBER Referred by Dr. [name] for concern of mac edema OU LEE:  Ocular Hx-pseudo OU; formerly followed at [facility] Dr. [name]. S/p focal laser OS x2, history of IVK and IVT OU PMH-anxiety, arthritis, CAD, DM (taking lantus and januvia), HTN, high cholesterol, prostate cancer    CURRENT MEDICATIONS: No current outpatient medications on file. (Ophthalmic Drugs)   No current facility-administered medications for this visit.  (Ophthalmic Drugs)   Current Outpatient Medications (Other)  Medication Sig  . acetaminophen (TYLENOL) 500 MG tablet Take 1,000 mg by mouth every 6 (six) hours as needed for moderate pain or headache.  . AMITIZA 24 MCG capsule Take 24 mcg by mouth daily as needed for constipation.   Marland Kitchen aspirin EC 81 MG tablet Take 81 mg by mouth daily.  . chlorthalidone (HYGROTON) 25 MG  tablet Take 25 mg by mouth daily.   . Cholecalciferol (VITAMIN D3 PO) Take 1 capsule by mouth daily.  . cilostazol (PLETAL) 100 MG tablet Take 100 mg by mouth 2 (two) times daily.  . clonazePAM (KLONOPIN) 0.5 MG tablet Take 0.5 mg by mouth daily as needed for anxiety.  . Cyanocobalamin (VITAMIN B-12 PO) Take 1 tablet by mouth daily.  . DULoxetine HCl 40 MG CPEP Take 1 capsule by mouth 2 (two) times daily.  . isosorbide mononitrate (IMDUR) 60 MG 24 hr tablet Take 60 mg by mouth daily.  Marland Kitchen JANUVIA 50 MG tablet Take 50 mg by mouth 2 (two) times daily.  Marland Kitchen LANTUS SOLOSTAR 100 UNIT/ML Solostar Pen Inject 35 Units into the skin 2 (two) times daily.   . metoprolol (LOPRESSOR) 50 MG tablet Take 50 mg by mouth 2 (two) times daily.  . nitroGLYCERIN (NITROSTAT) 0.4 MG SL tablet Place 0.4 mg under the tongue every 5 (five) minutes as needed for chest pain.   Marland Kitchen omeprazole (PRILOSEC) 20 MG capsule Take 20 mg by mouth daily.  . predniSONE (DELTASONE) 20 MG tablet Take 2 tablets daily with breakfast.  . pregabalin (LYRICA) 50 MG capsule Take 50 mg by mouth 3 (three) times daily.  . Pyridoxine HCl (VITAMIN B-6 PO) Take 1 tablet by mouth daily.  . rosuvastatin (CRESTOR) 20 MG tablet TAKE 1  TABLET BY MOUTH ONCE DAILY DISCONTINUE SIMVASTATIN  . simvastatin (ZOCOR) 20 MG tablet Take 20 mg by mouth every evening.  . tamsulosin (FLOMAX) 0.4 MG CAPS capsule Take 0.4 mg by mouth daily.   Current Facility-Administered Medications (Other)  Medication Route  . Bevacizumab (AVASTIN) SOLN 1.25 mg Intravitreal  . Bevacizumab (AVASTIN) SOLN 1.25 mg Intravitreal  . Bevacizumab (AVASTIN) SOLN 1.25 mg Intravitreal  . Bevacizumab (AVASTIN) SOLN 1.25 mg Intravitreal      REVIEW OF SYSTEMS: ROS    Positive for: Endocrine, Eyes, Psychiatric   Negative for: Constitutional, Gastrointestinal, Neurological, Skin, Genitourinary, Musculoskeletal, HENT, Cardiovascular, Respiratory, Allergic/Imm, Heme/Lymph   Last edited by Matthew Folks, COA on 07/27/2018  8:48 AM. (History)       ALLERGIES Allergies  Allergen Reactions  . Dye Fdc Red [Red Dye] Swelling and Other (See Comments)    CAT scan dye  . Ibuprofen Other (See Comments)    Upset stomach     PAST MEDICAL HISTORY Past Medical History:  Diagnosis Date  . Anxiety   . Arthritis   . Coronary artery disease   . Diabetes mellitus without complication ([place])   . Diverticulitis   . Dyspnea   . GERD (gastroesophageal reflux disease)   . Hypercholesteremia   . Hypertension   . Pneumonia    as a child  . Prostate cancer ([place])   . UTI (lower urinary tract infection)    Past Surgical History:  Procedure Laterality Date  . ABDOMINAL SURGERY     for diverticulitis; also removed appendix  . CATARACT EXTRACTION    . CATARACT EXTRACTION, BILATERAL    . CHOLECYSTECTOMY N/A 10/12/2017   Procedure: LAPAROSCOPIC CHOLECYSTECTOMY;  Surgeon: [name], MD;  Location: [place];  Service: General;  Laterality: N/A;  . CORONARY ARTERY BYPASS GRAFT  2009  . EYE SURGERY     "for bleeding in eye"  . HERNIA REPAIR    . LEFT HEART CATH AND CORONARY ANGIOGRAPHY N/A 11/04/2016   Procedure: Left Heart Cath and Coronary Angiography;  Surgeon: [name], MD;  Location: [place] [facility];  Service: Cardiovascular;  Laterality: N/A;  . LOWER EXTREMITY ANGIOGRAPHY N/A 11/04/2016   Procedure: Lower Extremity Angiography;  Surgeon: [name], MD;  Location: [place] [facility];  Service: Cardiovascular;  Laterality: N/A;  . PROSTATE SURGERY      FAMILY HISTORY Family History  Problem Relation Age of Onset  . Diabetes Father   . Diabetes Maternal Aunt   . Diabetes Maternal Uncle   . Diabetes Maternal Grandmother     SOCIAL HISTORY Social History   Tobacco Use  . Smoking status: Former Research scientist (life sciences)  . Smokeless tobacco: Never Used  Substance Use Topics  . Alcohol use: No  . Drug use: No         OPHTHALMIC EXAM:  Base Eye Exam    Visual Acuity (Snellen - Linear)       Right Left   Dist cc 20/25 -2 20/40 -2   Dist ph cc NI 20/40   Correction:  Glasses       Tonometry (Tonopen, 8:57 AM)      Right Left   Pressure 21 37       Tonometry #2 (Tonopen, 9:30 AM)      Right Left   Pressure 9 8       Tonometry Comments   Checked IOP OS 3 times.  Tonopen read 40, 39, 37.  Pt evaluated by Dr. Coralyn Pear, and given  a gtt of Cosopt OS @ 9:05 a.m. As directed.       Pupils      Dark Light Shape React APD   Right 2 1.5 Round Minimal None   Left 2 1.5 Round Minimal None       Visual Fields (Counting fingers)      Left Right    Full Full       Extraocular Movement      Right Left    Full, Ortho Full, Ortho       Neuro/Psych    Oriented x3:  Yes   Mood/Affect:  Normal       Dilation    Both eyes:  1.0% Mydriacyl, 2.5% Phenylephrine @ 8:57 AM        Slit Lamp and Fundus Exam    Slit Lamp Exam      Right Left   Lids/Lashes Dermatochalasis - upper lid, Meibomian gland dysfunction Dermatochalasis - upper lid, Meibomian gland dysfunction   Conjunctiva/Sclera Melanosis Melanosis   Cornea Arcus, 1+ Punctate epithelial erosions Arcus, 1+ Punctate epithelial erosions   Anterior Chamber Deep and quiet Deep and quiet   Iris Mild Temporal Iris atrophy, Round and dilated, No NVI Mild Temporal Iris atrophy, Round and dilated, No NVI   Lens PC IOL in good position with mild aterior capsule fimosis PC IOL in good position with mild aterior capsule fimosis   Vitreous Vitreous syneresis, Posterior vitreous detachment Vitreous syneresis, refractile deposits in posterior hyaloid, mild Asteroid hyalosis       Fundus Exam      Right Left   Disc Pink and Sharp, Temporal Peripapillary atrophy Tilted disc, Temporal Peripapillary atrophy   C/D Ratio 0.3 0.3   Macula Blunted foveal reflex, Retinal pigment epithelial mottling, Microaneurysms, +edema Blunted foveal reflex, focal area of RPE atrophy SN to fovea, Microaneurysms, Retinal pigment epithelial mottling,  refractile Epiretinal membrane   Vessels Dilation, Tortuous, AV crossing changes Dilation, Tortuous   Periphery Attached, scattered MA Attached, scattered DBH          IMAGING AND PROCEDURES  Imaging and Procedures for @TODAY @  OCT, Retina - OU - Both Eyes       Right Eye Quality was good. Central Foveal Thickness: 392. Progression has worsened. Findings include abnormal foveal contour, intraretinal fluid, no SRF (Scattered ORA, rare drusen, interval increase in IRF).   Left Eye Quality was good. Central Foveal Thickness: 290. Progression has improved. Findings include abnormal foveal contour, no SRF, intraretinal fluid, outer retinal atrophy, retinal drusen  (Mild interval decrease in IRF).   Notes *Images captured and stored on drive  Diagnosis / Impression:  DME OU, OD>OS OS w/ focal areas of outer retinal atrophy  Clinical management:  See below  Abbreviations: NFP - Normal foveal profile. CME - cystoid macular edema. PED - pigment epithelial detachment. IRF - intraretinal fluid. SRF - subretinal fluid. EZ - ellipsoid zone. ERM - epiretinal membrane. ORA - outer retinal atrophy. ORT - outer retinal tubulation. SRHM - subretinal hyper-reflective material         Intravitreal Injection, Pharmacologic Agent - OD - Right Eye       Time Out 07/27/2018. 9:44 AM. Confirmed correct patient, procedure, site, and patient consented.   Anesthesia Topical anesthesia was used. Anesthetic medications included Lidocaine 2%, Proparacaine 0.5%.   Procedure Preparation included 5% betadine to ocular surface, eyelid speculum. A 30 gauge needle was used.   Injection:  1.25 mg Bevacizumab 1.76m/0.05ml   NDC: 516109-604-54 Lot: [id number] ,  Expiration date: 09/09/2018   Route: Intravitreal, Site: Right Eye, Waste: 0 mL  Post-op Post injection exam found visual acuity of at least counting fingers. The patient tolerated the procedure well. There were no complications. The  patient received written and verbal post procedure care education.        Intravitreal Injection, Pharmacologic Agent - OS - Left Eye       Time Out 07/27/2018. 9:44 AM. Confirmed correct patient, procedure, site, and patient consented.   Anesthesia Topical anesthesia was used. Anesthetic medications included Lidocaine 2%, Proparacaine 0.5%.   Procedure Preparation included 5% betadine to ocular surface, eyelid speculum. A supplied needle was used.   Injection:  1.25 mg Bevacizumab 1.54m/0.05ml   NDC: 576811-572-62 Lot: [id number] , Expiration date: 09/09/2018   Route: Intravitreal, Site: Left Eye, Waste: 0 mg  Post-op Post injection exam found visual acuity of at least counting fingers. The patient tolerated the procedure well. There were no complications. The patient received written and verbal post procedure care education.                 ASSESSMENT/PLAN:    ICD-10-CM   1. Moderate nonproliferative diabetic retinopathy of both eyes with macular edema associated with type 2 diabetes mellitus (HCC) E25/12/2019Intravitreal Injection, Pharmacologic Agent - OD - Right Eye    Intravitreal Injection, Pharmacologic Agent - OS - Left Eye    Bevacizumab (AVASTIN) SOLN 1.25 mg    Bevacizumab (AVASTIN) SOLN 1.25 mg  2. Retinal edema H35.81 OCT, Retina - OU - Both Eyes  3. Essential hypertension I10   4. Hypertensive retinopathy of both eyes H35.033   5. Pseudophakia of both eyes Z96.1     1,2. Moderate Non-proliferative diabetic retinopathy with edema OU - [place] of [place]- [facility] [place] - history of focal laser OS x2 and IVK/IVT OU in [place]-- last injection was IVK OS November 2013 - exam scattered IRH, DBH and +macular edema - OCT shows diabetic macular edema, both eyes, (OD > OS) - S/P IVA OD #1 (09.17.19) - S/P IVA OS #1 (10.01.19) -  FA (10.1.19) showed late leaking MA OU -- no NV - today, DME slightly worse OD, improved but persistent OS - recommend IVA OU #2 today (10.29.19) - pt wishes to proceed - RBA of procedure discussed, questions answered - informed consent obtained and signed - see procedure note - f/u in 4 weeks, DFE, OCT   3,4. Hypertensive retinopathy OU - discussed importance of tight BP control - monitor  5. Pseudophakia OU  - s[place] CE[place] beautiful surgeries, doing well  - monitor   Ophthalmic Meds Ordered this visit:  Meds ordered this encounter  Medications  . Bevacizumab (AVASTIN) SOLN 1.25 mg  . Bevacizumab (AVASTIN) SOLN 1.25 mg       Return in about 4 weeks (around 08/24/2018) for F/U NPDR OU, DFE, OCT.  There are no Patient Instructions on file for this visit.   Explained the diagnoses, plan, and follow up with the patient and they expressed understanding.  Patient expressed understanding of the importance of proper follow up care.   This document serves as a record of services personally performed by B12/05/2018 MD, PhD. It was created on their behalf by AGardiner Sleeper OA, an ophthalmic assistant. The creation of this record is the provider's dictation and/or activities during  the visit.    Electronically signed by: Ernest Mallick, OA  10.28.19 9:05 AM    Gardiner Sleeper, M.D., Ph.D. Diseases & Surgery of the Retina and Vitreous [facility]   I have reviewed the above documentation for accuracy and completeness, and I agree with the above. Gardiner Sleeper, M.D., Ph.D. 07/28/18 9:05 AM    Abbreviations: M myopia (nearsighted); A astigmatism; H hyperopia (farsighted); P presbyopia; Mrx spectacle prescription;  CTL contact lenses; OD right eye; OS left eye; OU both eyes  XT exotropia; ET esotropia; PEK punctate epithelial keratitis; PEE punctate epithelial erosions; DES dry eye syndrome; MGD meibomian gland dysfunction; ATs  artificial tears; PFAT's preservative free artificial tears; [place] nuclear sclerotic cataract; PSC posterior subcapsular cataract; ERM epi-retinal membrane; PVD posterior vitreous detachment; RD retinal detachment; DM diabetes mellitus; DR diabetic retinopathy; NPDR non-proliferative diabetic retinopathy; PDR proliferative diabetic retinopathy; CSME clinically significant macular edema; DME diabetic macular edema; dbh dot blot hemorrhages; CWS cotton wool spot; POAG primary open angle glaucoma; C/D cup-to-disc ratio; HVF humphrey visual field; GVF goldmann visual field; OCT optical coherence tomography; IOP intraocular pressure; BRVO Branch retinal vein occlusion; CRVO central retinal vein occlusion; CRAO central retinal artery occlusion; BRAO branch retinal artery occlusion; RT retinal tear; SB scleral buckle; PPV pars plana vitrectomy; VH Vitreous hemorrhage; PRP panretinal laser photocoagulation; IVK intravitreal kenalog; VMT vitreomacular traction; MH Macular hole;  NVD neovascularization of the disc; NVE neovascularization elsewhere; AREDS age related eye disease study; ARMD age related macular degeneration; POAG primary open angle glaucoma; EBMD epithelial/anterior basement membrane dystrophy; ACIOL anterior chamber intraocular lens; IOL intraocular lens; PCIOL posterior chamber intraocular lens; Phaco/IOL phacoemulsification with intraocular lens placement; [place] photorefractive keratectomy; LASIK laser assisted in situ keratomileusis; HTN hypertension; DM diabetes mellitus; COPD chronic obstructive pulmonary disease

## 2018-07-27 ENCOUNTER — Ambulatory Visit (INDEPENDENT_AMBULATORY_CARE_PROVIDER_SITE_OTHER): Payer: Medicare PPO | Admitting: Ophthalmology

## 2018-07-27 ENCOUNTER — Encounter (HOSPITAL_COMMUNITY): Payer: Medicare PPO

## 2018-07-27 ENCOUNTER — Encounter (INDEPENDENT_AMBULATORY_CARE_PROVIDER_SITE_OTHER): Payer: Self-pay | Admitting: Ophthalmology

## 2018-07-27 DIAGNOSIS — E113313 Type 2 diabetes mellitus with moderate nonproliferative diabetic retinopathy with macular edema, bilateral: Secondary | ICD-10-CM | POA: Diagnosis not present

## 2018-07-27 DIAGNOSIS — H35033 Hypertensive retinopathy, bilateral: Secondary | ICD-10-CM

## 2018-07-27 DIAGNOSIS — H3581 Retinal edema: Secondary | ICD-10-CM | POA: Diagnosis not present

## 2018-07-27 DIAGNOSIS — Z961 Presence of intraocular lens: Secondary | ICD-10-CM

## 2018-07-27 DIAGNOSIS — I10 Essential (primary) hypertension: Secondary | ICD-10-CM

## 2018-07-28 ENCOUNTER — Encounter (INDEPENDENT_AMBULATORY_CARE_PROVIDER_SITE_OTHER): Payer: Self-pay | Admitting: Ophthalmology

## 2018-07-28 DIAGNOSIS — E113313 Type 2 diabetes mellitus with moderate nonproliferative diabetic retinopathy with macular edema, bilateral: Secondary | ICD-10-CM | POA: Diagnosis not present

## 2018-07-28 DIAGNOSIS — H3581 Retinal edema: Secondary | ICD-10-CM | POA: Diagnosis not present

## 2018-07-28 MED ORDER — BEVACIZUMAB CHEMO INJECTION 1.25MG/0.05ML SYRINGE FOR KALEIDOSCOPE
1.2500 mg | INTRAVITREAL | Status: DC
  Administered 2018-07-28: 1.25 mg via INTRAVITREAL

## 2018-07-29 ENCOUNTER — Encounter (HOSPITAL_COMMUNITY): Payer: Medicare PPO

## 2018-08-10 DIAGNOSIS — N400 Enlarged prostate without lower urinary tract symptoms: Secondary | ICD-10-CM | POA: Diagnosis not present

## 2018-08-10 DIAGNOSIS — I10 Essential (primary) hypertension: Secondary | ICD-10-CM | POA: Diagnosis not present

## 2018-08-10 DIAGNOSIS — F419 Anxiety disorder, unspecified: Secondary | ICD-10-CM | POA: Diagnosis not present

## 2018-08-10 DIAGNOSIS — D509 Iron deficiency anemia, unspecified: Secondary | ICD-10-CM | POA: Diagnosis not present

## 2018-08-10 DIAGNOSIS — N183 Chronic kidney disease, stage 3 (moderate): Secondary | ICD-10-CM | POA: Diagnosis not present

## 2018-08-10 DIAGNOSIS — H538 Other visual disturbances: Secondary | ICD-10-CM | POA: Diagnosis not present

## 2018-08-10 DIAGNOSIS — Z0001 Encounter for general adult medical examination with abnormal findings: Secondary | ICD-10-CM | POA: Diagnosis not present

## 2018-08-10 DIAGNOSIS — E118 Type 2 diabetes mellitus with unspecified complications: Secondary | ICD-10-CM | POA: Diagnosis not present

## 2018-08-10 DIAGNOSIS — R1013 Epigastric pain: Secondary | ICD-10-CM | POA: Diagnosis not present

## 2018-08-10 DIAGNOSIS — I2510 Atherosclerotic heart disease of native coronary artery without angina pectoris: Secondary | ICD-10-CM | POA: Diagnosis not present

## 2018-08-10 DIAGNOSIS — Z01110 Encounter for hearing examination following failed hearing screening: Secondary | ICD-10-CM | POA: Diagnosis not present

## 2018-08-15 NOTE — Addendum Note (Signed)
Encounter addended by: Rowe Pavy, RN on: 08/15/2018 8:08 PM  Actions taken: Episode resolved, Flowsheet data copied forward, Visit Navigator Flowsheet section accepted, Sign clinical note

## 2018-08-15 NOTE — Progress Notes (Signed)
Discharge Progress Report  Patient Details  Name: Justin [name] MRN: [id number] Date of Birth: 1933-05-13 Referring Provider:     Pulmonary Rehab Walk Test from 04/13/2018 in [place]  Referring Provider  Dr. Einar Gip       Number of Visits: 4  Reason for Discharge:  Early Exit:  Personal  Smoking History:  Social History   Tobacco Use  Smoking Status Former Smoker  Smokeless Tobacco Never Used    Diagnosis:  Congestive heart failure, unspecified HF chronicity, unspecified heart failure type ([place])  ADL UCSD: Pulmonary Assessment Scores    Row Name 04/15/18 0812         ADL UCSD   ADL Phase  Entry       mMRC Score   mMRC Score  1        Initial Exercise Prescription: Initial Exercise Prescription - 04/15/18 0800      Date of Initial Exercise RX and Referring Provider   Date  04/13/18    Referring Provider  Dr. Einar Gip      Recumbant Bike   Level  1    Watts  10    Minutes  17      NuStep   Level  2    SPM  80    Minutes  17    METs  1.5      Track   Laps  5    Minutes  17      Prescription Details   Frequency (times per week)  2    Duration  Progress to 45 minutes of aerobic exercise without signs/symptoms of physical distress      Intensity   THRR 40-80% of Max Heartrate  55-110    Ratings of Perceived Exertion  11-13    Perceived Dyspnea  0-4      Progression   Progression  Continue progressive overload as per policy without signs/symptoms or physical distress.      Resistance Training   Training Prescription  Yes    Weight  blue bands    Reps  10-15       Discharge Exercise Prescription (Final Exercise Prescription Changes): Exercise Prescription Changes - 05/04/18 1200      Response to Exercise   Blood Pressure (Admit)  124/56    Blood Pressure (Exercise)  142/64    Blood Pressure (Exit)  122/58    Heart Rate (Admit)  63 bpm    Heart Rate (Exercise)  73 bpm    Heart Rate (Exit)  67 bpm    Oxygen  Saturation (Admit)  97 %    Oxygen Saturation (Exercise)  97 %    Oxygen Saturation (Exit)  94 %    Rating of Perceived Exertion (Exercise)  13    Perceived Dyspnea (Exercise)  1    Duration  Progress to 45 minutes of aerobic exercise without signs/symptoms of physical distress    Intensity  --   40-80% HRR     Progression   Progression  Continue to progress workloads to maintain intensity without signs/symptoms of physical distress.      Resistance Training   Training Prescription  Yes    Weight  blue bands    Reps  10-15    Time  10 Minutes      Interval Training   Interval Training  No      NuStep   Level  2    SPM  80    Minutes  17    METs  2.2      Track   Laps  11    Minutes  17       Functional Capacity: 6 Minute Walk    Row Name 04/15/18 0812         6 Minute Walk   Phase  Initial     Distance  900 feet     Walk Time  6 minutes     # of Rest Breaks  0     MPH  1.7     METS  2.3     RPE  14     Perceived Dyspnea   3     Symptoms  Yes (comment)     Comments  leg pain 9/10     Resting HR  67 bpm     Resting BP  138/60     Resting Oxygen Saturation   95 %     Exercise Oxygen Saturation  during 6 min walk  95 %     Max Ex. HR  80 bpm     Max Ex. BP  166/64       Interval HR   1 Minute HR  74     2 Minute HR  80     3 Minute HR  80     4 Minute HR  76     5 Minute HR  77     6 Minute HR  78     2 Minute Post HR  71     Interval Heart Rate?  Yes       Interval Oxygen   Interval Oxygen?  Yes     Baseline Oxygen Saturation %  95 %     1 Minute Oxygen Saturation %  95 %     1 Minute Liters of Oxygen  0 L     2 Minute Oxygen Saturation %  96 %     2 Minute Liters of Oxygen  0 L     3 Minute Oxygen Saturation %  96 %     3 Minute Liters of Oxygen  0 L     4 Minute Oxygen Saturation %  96 %     4 Minute Liters of Oxygen  0 L     5 Minute Oxygen Saturation %  96 %     5 Minute Liters of Oxygen  0 L     6 Minute Oxygen Saturation %  96 %      6 Minute Liters of Oxygen  0 L     2 Minute Post Oxygen Saturation %  97 %     2 Minute Post Liters of Oxygen  0 L        Psychological, QOL, Others - Outcomes: PHQ 2/9: Depression screen PHQ 2/9 01/29/2018  Decreased Interest 0  Down, Depressed, Hopeless 0  PHQ - 2 Score 0    Quality of Life:   Personal Goals: Goals established at orientation with interventions provided to work toward goal.    Personal Goals Discharge: Goals and Risk Factor Review    Row Name 04/20/18 1232 05/18/18 1850 08/15/18 2004         Core Components/Risk Factors/Patient Goals Review   Personal Goals Review  Improve shortness of breath with ADL's;Weight Management/Obesity;Diabetes  Improve shortness of breath with ADL's;Weight Management/Obesity;Diabetes;Increase knowledge of respiratory medications and ability to use respiratory devices properly.;Develop more efficient breathing techniques such as purse lipped breathing  and diaphragmatic breathing and practicing self-pacing with activity.  Improve shortness of breath with ADL's;Weight Management/Obesity;Diabetes;Increase knowledge of respiratory medications and ability to use respiratory devices properly.;Develop more efficient breathing techniques such as purse lipped breathing and diaphragmatic breathing and practicing self-pacing with activity.     Review  Pt completed his first day of exercise on 7/23.  Unable to review pt progress toward pt goals  Pt has completed 4 exercise session. Pt absent due to abdominal discomfort and diarrhea.  Pt is hopeful to return soon.  Workloads Nustep level 2, track 11-12 laps and recumbent bike level 1.  Pt weight remains unchanged since beging exercise with PR.  Pt reports dypnea level 1-2 on exertion.  I anticipate when pt returns he will be able to show meausrable progress toward goals.  Pt discharged with the completion of 4 exercise sessions.  Pt discharged due to ongoing GI distress.     Expected Outcomes  See Admission  Goals/Outcomes  See Admission Goals/Outcomes  Unable to meet goals due to short period of participation        Exercise Goals and Review:   Exercise Goals Re-Evaluation: Exercise Goals Re-Evaluation    [place] Name 04/19/18 0941 05/17/18 0856           Exercise Goal Re-Evaluation   Exercise Goals Review  Increase Physical Activity;Able to understand and use rate of perceived exertion (RPE) scale;Knowledge and understanding of Target Heart Rate Range (THRR);Understanding of Exercise Prescription;Increase Strength and Stamina;Able to understand and use Dyspnea scale  Increase Physical Activity;Able to understand and use rate of perceived exertion (RPE) scale;Knowledge and understanding of Target Heart Rate Range (THRR);Understanding of Exercise Prescription;Increase Strength and Stamina;Able to understand and use Dyspnea scale      Comments  Patient will start his first day of rehab 04/20/18  Patient has attended 4 rehab sessions. He has been dealing with some back and leg pain that has limited his exercise. I will cont to monitor and motivate patient.       Expected Outcomes  Through exercise at rehab and at home, the patient will decrease shortness of breath with daily activities and feel confident in carrying out an exercise regime at home.   Through exercise at rehab and at home, the patient will decrease shortness of breath with daily activities and feel confident in carrying out an exercise regime at home.          Nutrition & Weight - Outcomes:    Nutrition: Nutrition Therapy & Goals - 04/27/18 1144      Nutrition Therapy   Diet  consistent carbohydrate heart healthy      Personal Nutrition Goals   Nutrition Goal  Pt to identify and limit sodium, saturated fat, trans fats, refined carbohydrates    Personal Goal #2  Identify food quantities necessary to achieve wt loss of  -2# per week to a goal wt loss of 6-24 lb at graduation from pulmonary rehab.      Intervention Plan    Intervention  Prescribe, educate and counsel regarding individualized specific dietary modifications aiming towards targeted core components such as weight, hypertension, lipid management, diabetes, heart failure and other comorbidities.    Expected Outcomes  Short Term Goal: Understand basic principles of dietary content, such as calories, fat, sodium, cholesterol and nutrients.       Nutrition Discharge: Nutrition Assessments - 07/02/18 0751      Rate Your Plate Scores   Pre Score  41    Post Score  --  pt did not return survey      Education Questionnaire Score:   Cherre Huger, BSN Cardiac and Training and development officer

## 2018-08-23 NOTE — Progress Notes (Signed)
[facility] Note  08/24/2018     CHIEF COMPLAINT Patient presents for Retina Follow Up   HISTORY OF PRESENT ILLNESS: Justin [name] is a 84 y.o. male who presents to the clinic today for:   HPI    Retina Follow Up    Patient presents with  Diabetic Retinopathy.  In both eyes.  Severity is moderate.  Duration of 4 weeks.  Since onset it is stable.  I, the attending physician,  performed the HPI with the patient and updated documentation appropriately.          Comments    4 week follow up for NPDR OU, Left eye letters do not line up, they float around and run together. Blood Sugar today 108       Last edited by [name], MD on 08/24/2018  8:44 AM. (History)    pt states he sees one floater floating around in his right eye, he states his vision in his left eye is a little distorted, pt states he did not have any problems with having injections in both eyes last time  Referring physician: [name], MD [address] [place], [place] [address]  HISTORICAL INFORMATION:   Selected notes from the [facility] Referred by Dr. [name] for concern of mac edema OU LEE:  Ocular Hx-pseudo OU; formerly followed at [facility] Dr. [name]. S/p focal laser OS x2, history of IVK and IVT OU PMH-anxiety, arthritis, CAD, DM (taking lantus and januvia), HTN, high cholesterol, prostate cancer    CURRENT MEDICATIONS: No current outpatient medications on file. (Ophthalmic Drugs)   No current facility-administered medications for this visit.  (Ophthalmic Drugs)   Current Outpatient Medications (Other)  Medication Sig  . acetaminophen (TYLENOL) 500 MG tablet Take 1,000 mg by mouth every 6 (six) hours as needed for moderate pain or headache.  . AMITIZA 24 MCG capsule Take 24 mcg by mouth daily as needed for constipation.   Marland Kitchen aspirin EC 81 MG tablet Take 81 mg by mouth daily.  . chlorthalidone (HYGROTON) 25 MG  tablet Take 25 mg by mouth daily.   . Cholecalciferol (VITAMIN D3 PO) Take 1 capsule by mouth daily.  . cilostazol (PLETAL) 100 MG tablet Take 100 mg by mouth 2 (two) times daily.  . clonazePAM (KLONOPIN) 0.5 MG tablet Take 0.5 mg by mouth daily as needed for anxiety.  . Cyanocobalamin (VITAMIN B-12 PO) Take 1 tablet by mouth daily.  . DULoxetine HCl 40 MG CPEP Take 1 capsule by mouth 2 (two) times daily.  . isosorbide mononitrate (IMDUR) 60 MG 24 hr tablet Take 60 mg by mouth daily.  Marland Kitchen JANUVIA 50 MG tablet Take 50 mg by mouth 2 (two) times daily.  Marland Kitchen LANTUS SOLOSTAR 100 UNIT/ML Solostar Pen Inject 35 Units into the skin 2 (two) times daily.   . metoprolol (LOPRESSOR) 50 MG tablet Take 50 mg by mouth 2 (two) times daily.  . nitroGLYCERIN (NITROSTAT) 0.4 MG SL tablet Place 0.4 mg under the tongue every 5 (five) minutes as needed for chest pain.   Marland Kitchen omeprazole (PRILOSEC) 20 MG capsule Take 20 mg by mouth daily.  . predniSONE (DELTASONE) 20 MG tablet Take 2 tablets daily with breakfast.  . pregabalin (LYRICA) 50 MG capsule Take 50 mg by mouth 3 (three) times daily.  . Pyridoxine HCl (VITAMIN B-6 PO) Take 1 tablet by mouth daily.  . rosuvastatin (CRESTOR) 20 MG tablet TAKE 1 TABLET  BY MOUTH ONCE DAILY DISCONTINUE SIMVASTATIN  . simvastatin (ZOCOR) 20 MG tablet Take 20 mg by mouth every evening.  . tamsulosin (FLOMAX) 0.4 MG CAPS capsule Take 0.4 mg by mouth daily.   Current Facility-Administered Medications (Other)  Medication Route  . Bevacizumab (AVASTIN) SOLN 1.25 mg Intravitreal  . Bevacizumab (AVASTIN) SOLN 1.25 mg Intravitreal  . Bevacizumab (AVASTIN) SOLN 1.25 mg Intravitreal  . Bevacizumab (AVASTIN) SOLN 1.25 mg Intravitreal  . Bevacizumab (AVASTIN) SOLN 1.25 mg Intravitreal  . Bevacizumab (AVASTIN) SOLN 1.25 mg Intravitreal      REVIEW OF SYSTEMS: ROS    Positive for: Endocrine, Eyes, Psychiatric   Negative for: Constitutional, Gastrointestinal, Neurological, Skin,  Genitourinary, Musculoskeletal, HENT, Cardiovascular, Respiratory, Allergic/Imm, Heme/Lymph   Last edited by Elmore Guise on 08/24/2018  8:41 AM. (History)       ALLERGIES Allergies  Allergen Reactions  . Dye Fdc Red [Red Dye] Swelling and Other (See Comments)    CAT scan dye  . Ibuprofen Other (See Comments)    Upset stomach     PAST MEDICAL HISTORY Past Medical History:  Diagnosis Date  . Anxiety   . Arthritis   . Coronary artery disease   . Diabetes mellitus without complication ([place])   . Diverticulitis   . Dyspnea   . GERD (gastroesophageal reflux disease)   . Hypercholesteremia   . Hypertension   . Pneumonia    as a child  . Prostate cancer ([place])   . UTI (lower urinary tract infection)    Past Surgical History:  Procedure Laterality Date  . ABDOMINAL SURGERY     for diverticulitis; also removed appendix  . CATARACT EXTRACTION    . CATARACT EXTRACTION, BILATERAL    . CHOLECYSTECTOMY N/A 10/12/2017   Procedure: LAPAROSCOPIC CHOLECYSTECTOMY;  Surgeon: [name], MD;  Location: [facility];  Service: General;  Laterality: N/A;  . CORONARY ARTERY BYPASS GRAFT  2009  . EYE SURGERY     "for bleeding in eye"  . HERNIA REPAIR    . LEFT HEART CATH AND CORONARY ANGIOGRAPHY N/A 11/04/2016   Procedure: Left Heart Cath and Coronary Angiography;  Surgeon: [name], MD;  Location: [place] [facility];  Service: Cardiovascular;  Laterality: N/A;  . LOWER EXTREMITY ANGIOGRAPHY N/A 11/04/2016   Procedure: Lower Extremity Angiography;  Surgeon: [name], MD;  Location: [place] [facility];  Service: Cardiovascular;  Laterality: N/A;  . PROSTATE SURGERY      FAMILY HISTORY Family History  Problem Relation Age of Onset  . Diabetes Father   . Diabetes Maternal Aunt   . Diabetes Maternal Uncle   . Diabetes Maternal Grandmother     SOCIAL HISTORY Social History   Tobacco Use  . Smoking status: Former Research scientist (life sciences)  . Smokeless tobacco: Never Used  Substance Use Topics  .  Alcohol use: No  . Drug use: No         OPHTHALMIC EXAM:  Base Eye Exam    Visual Acuity (Snellen - Linear)      Right Left   Dist cc 20/20-3 20/40-2   Dist ph cc 20/NI 20/25-1   Correction:  Glasses       Tonometry (Tonopen, 8:44 AM)      Right Left   Pressure 14 15       Pupils      Dark Light Shape React APD   Right 2 1.5 Round Minimal None   Left 2 1.5 Round Minimal None       Visual  Fields (Counting fingers)      Left Right    Full Full       Extraocular Movement      Right Left    Full Full       Neuro/Psych    Oriented x3:  Yes   Mood/Affect:  Normal       Dilation    Both eyes:  1.0% Mydriacyl, 2.5% Phenylephrine @ 8:44 AM        Slit Lamp and Fundus Exam    Slit Lamp Exam      Right Left   Lids/Lashes Dermatochalasis - upper lid, Meibomian gland dysfunction Dermatochalasis - upper lid, Meibomian gland dysfunction   Conjunctiva/Sclera Melanosis Melanosis   Cornea Arcus, 1+ Punctate epithelial erosions Arcus, 1+ Punctate epithelial erosions   Anterior Chamber Deep and quiet Deep and quiet   Iris Mild Temporal Iris atrophy, Round and dilated, No NVI Mild Temporal Iris atrophy, Round and dilated, No NVI   Lens PC IOL in good position with mild aterior capsule fimosis PC IOL in good position with mild aterior capsule fimosis   Vitreous Vitreous syneresis, Posterior vitreous detachment Vitreous syneresis, refractile deposits in posterior hyaloid, mild Asteroid hyalosis       Fundus Exam      Right Left   Disc Mild tilt Tilted disc, Temporal Peripapillary atrophy   C/D Ratio 0.3 0.3   Macula Blunted foveal reflex, Retinal pigment epithelial mottling, Microaneurysms, +edema - interval improvement Blunted foveal reflex, focal area of RPE atrophy SN to fovea, Microaneurysms, Retinal pigment epithelial mottling, refractile Epiretinal membrane   Vessels Dilation, Tortuous, AV crossing changes Dilation, Tortuous   Periphery Attached, scattered MA  Attached, flame heme at 1100 midzone        Refraction    Wearing Rx      Sphere Cylinder Axis Add   Right +0.25 +0.50 010 +3.00   Left [place] +0.75 173 +3.00   Type:  Bifocal          IMAGING AND PROCEDURES  Imaging and Procedures for _0 @  OCT, Retina - OU - Both Eyes       Right Eye Quality was good. Central Foveal Thickness: 367. Progression has improved. Findings include abnormal foveal contour, intraretinal fluid, no SRF (Scattered ORA, rare drusen, interval improvement in IRF).   Left Eye Quality was good. Central Foveal Thickness: 297. Findings include abnormal foveal contour, no SRF, intraretinal fluid, outer retinal atrophy, retinal drusen  (persistent IRF).   Notes *Images captured and stored on drive  Diagnosis / Impression:  DME OU, OD>OS Persistent IRF OU OS w/ focal areas of outer retinal atrophy  Clinical management:  See below  Abbreviations: NFP - Normal foveal profile. CME - cystoid macular edema. PED - pigment epithelial detachment. IRF - intraretinal fluid. SRF - subretinal fluid. EZ - ellipsoid zone. ERM - epiretinal membrane. ORA - outer retinal atrophy. ORT - outer retinal tubulation. SRHM - subretinal hyper-reflective material         Intravitreal Injection, Pharmacologic Agent - OD - Right Eye       Time Out 08/24/2018. 10:00 AM. Confirmed correct patient, procedure, site, and patient consented.   Anesthesia Topical anesthesia was used. Anesthetic medications included Lidocaine 2%, Proparacaine 0.5%.   Procedure Preparation included 5% betadine to ocular surface, eyelid speculum. A supplied needle was used.   Injection:  1.25 mg Bevacizumab 1.81m/0.05ml   NDC: 50242-060-01, Lot: [id number] , Expiration date: 10/18/2018   Route: Intravitreal, Site: Right Eye, Waste: 0 mL  Post-op Post injection exam found visual acuity of at least counting fingers. The patient tolerated the procedure well. There were no complications. The  patient received written and verbal post procedure care education.        Intravitreal Injection, Pharmacologic Agent - OS - Left Eye       Time Out 08/24/2018. 10:00 AM. Confirmed correct patient, procedure, site, and patient consented.   Anesthesia Topical anesthesia was used. Anesthetic medications included Lidocaine 2%, Proparacaine 0.5%.   Procedure Preparation included 5% betadine to ocular surface, eyelid speculum. A 30 gauge needle was used.   Injection:  1.25 mg Bevacizumab 1.63m/0.05ml   NDC: 50242-060-01, Lot: [id number] , Expiration date: 10/14/2018   Route: Intravitreal, Site: Left Eye, Waste: 0 mg  Post-op Post injection exam found visual acuity of at least counting fingers. The patient tolerated the procedure well. There were no complications. The patient received written and verbal post procedure care education.                 ASSESSMENT/PLAN:    ICD-10-CM   1. Moderate nonproliferative diabetic retinopathy of both eyes with macular edema associated with type 2 diabetes mellitus (HCC) EX52.8413Intravitreal Injection, Pharmacologic Agent - OD - Right Eye    Intravitreal Injection, Pharmacologic Agent - OS - Left Eye    Bevacizumab (AVASTIN) SOLN 1.25 mg    Bevacizumab (AVASTIN) SOLN 1.25 mg  2. Retinal edema H35.81 OCT, Retina - OU - Both Eyes  3. Essential hypertension I10   4. Hypertensive retinopathy of both eyes H35.033   5. Pseudophakia of both eyes Z96.1     1,2. Moderate Non-proliferative diabetic retinopathy with edema OU [place] history of prior injections OS with Dr. OSharlyn Bologna- [facility] [place] [place] [facility] - history of focal laser OS x2 and [place]-- last injection was IVK OS November 2013 - exam scattered IRH, DBH and +macular edema - OCT shows diabetic macular edema, both eyes, (OD > OS) - S/P IVA OD #1 (09.17.19), #2 (10.29.19) - S/P IVA OS  #1 (10.01.19), #2 (10.29.19) - FA (10.1.19) showed late leaking MA OU -- no NV - today, OCT shows persistent IRF OU; BCVA improved -- 20/20 OD, 20/25 OS - recommend IVA OU # today (11.26.19) - pt wishes to proceed - RBA of procedure discussed, questions answered - informed consent obtained and signed - see procedure note - f/u in 4 weeks, DFE, OCT, possible focal laser   3,4. Hypertensive retinopathy OU - discussed importance of tight BP control - monitor  5. Pseudophakia OU  - [place] beautiful surgeries, doing well  - monitor   Ophthalmic Meds Ordered this visit:  Meds ordered this encounter  Medications  . Bevacizumab (AVASTIN) SOLN 1.25 mg  . Bevacizumab (AVASTIN) SOLN 1.25 mg       Return for f/u week of December 30 NPDR, DFE, OCT.  There are no Patient Instructions on file for this visit.   Explained the diagnoses, plan, and follow up with the patient and they expressed understanding.  Patient expressed understanding of the importance of proper follow up care.   This document serves as a record of services personally performed by BGardiner Sleeper MD, PhD. It was created on their behalf by AErnest Mallick OA, an ophthalmic assistant. The creation of this record is the provider's dictation and/or activities during the visit.  Electronically signed by: Ernest Mallick, OA  11.25.19 12:46 PM    Gardiner Sleeper, M.D., Ph.D. Diseases & Surgery of the Retina and Vitreous [facility]   I have reviewed the above documentation for accuracy and completeness, and I agree with the above. Gardiner Sleeper, M.D., Ph.D. 08/24/18 12:48 PM     Abbreviations: M myopia (nearsighted); A astigmatism; H hyperopia (farsighted); P presbyopia; Mrx spectacle prescription;  CTL contact lenses; OD right eye; OS left eye; OU both eyes  XT exotropia; ET esotropia; PEK punctate epithelial keratitis; PEE punctate epithelial erosions; DES dry eye  syndrome; MGD meibomian gland dysfunction; ATs artificial tears; PFAT's preservative free artificial tears; [place] nuclear sclerotic cataract; PSC posterior subcapsular cataract; ERM epi-retinal membrane; PVD posterior vitreous detachment; RD retinal detachment; DM diabetes mellitus; DR diabetic retinopathy; NPDR non-proliferative diabetic retinopathy; PDR proliferative diabetic retinopathy; CSME clinically significant macular edema; DME diabetic macular edema; dbh dot blot hemorrhages; CWS cotton wool spot; POAG primary open angle glaucoma; C/D cup-to-disc ratio; HVF humphrey visual field; GVF goldmann visual field; OCT optical coherence tomography; IOP intraocular pressure; BRVO Branch retinal vein occlusion; CRVO central retinal vein occlusion; CRAO central retinal artery occlusion; BRAO branch retinal artery occlusion; RT retinal tear; SB scleral buckle; PPV pars plana vitrectomy; VH Vitreous hemorrhage; PRP panretinal laser photocoagulation; IVK intravitreal kenalog; VMT vitreomacular traction; MH Macular hole;  NVD neovascularization of the disc; NVE neovascularization elsewhere; AREDS age related eye disease study; ARMD age related macular degeneration; POAG primary open angle glaucoma; EBMD epithelial/anterior basement membrane dystrophy; ACIOL anterior chamber intraocular lens; IOL intraocular lens; PCIOL posterior chamber intraocular lens; Phaco/IOL phacoemulsification with intraocular lens placement; [place] photorefractive keratectomy; LASIK laser assisted in situ keratomileusis; HTN hypertension; DM diabetes mellitus; COPD chronic obstructive pulmonary disease

## 2018-08-24 ENCOUNTER — Encounter (INDEPENDENT_AMBULATORY_CARE_PROVIDER_SITE_OTHER): Payer: Self-pay | Admitting: Ophthalmology

## 2018-08-24 ENCOUNTER — Ambulatory Visit (INDEPENDENT_AMBULATORY_CARE_PROVIDER_SITE_OTHER): Payer: Medicare PPO | Admitting: Ophthalmology

## 2018-08-24 DIAGNOSIS — Z961 Presence of intraocular lens: Secondary | ICD-10-CM | POA: Diagnosis not present

## 2018-08-24 DIAGNOSIS — E113313 Type 2 diabetes mellitus with moderate nonproliferative diabetic retinopathy with macular edema, bilateral: Secondary | ICD-10-CM | POA: Diagnosis not present

## 2018-08-24 DIAGNOSIS — H35033 Hypertensive retinopathy, bilateral: Secondary | ICD-10-CM

## 2018-08-24 DIAGNOSIS — H3581 Retinal edema: Secondary | ICD-10-CM | POA: Diagnosis not present

## 2018-08-24 DIAGNOSIS — I10 Essential (primary) hypertension: Secondary | ICD-10-CM | POA: Diagnosis not present

## 2018-08-24 MED ORDER — BEVACIZUMAB CHEMO INJECTION 1.25MG/0.05ML SYRINGE FOR KALEIDOSCOPE
1.2500 mg | INTRAVITREAL | Status: DC
  Administered 2018-08-24: 1.25 mg via INTRAVITREAL

## 2018-08-31 DIAGNOSIS — I10 Essential (primary) hypertension: Secondary | ICD-10-CM | POA: Diagnosis not present

## 2018-08-31 DIAGNOSIS — N183 Chronic kidney disease, stage 3 (moderate): Secondary | ICD-10-CM | POA: Diagnosis not present

## 2018-08-31 DIAGNOSIS — E782 Mixed hyperlipidemia: Secondary | ICD-10-CM | POA: Diagnosis not present

## 2018-08-31 DIAGNOSIS — N3000 Acute cystitis without hematuria: Secondary | ICD-10-CM | POA: Diagnosis not present

## 2018-08-31 DIAGNOSIS — I2510 Atherosclerotic heart disease of native coronary artery without angina pectoris: Secondary | ICD-10-CM | POA: Diagnosis not present

## 2018-08-31 DIAGNOSIS — R1013 Epigastric pain: Secondary | ICD-10-CM | POA: Diagnosis not present

## 2018-08-31 DIAGNOSIS — D509 Iron deficiency anemia, unspecified: Secondary | ICD-10-CM | POA: Diagnosis not present

## 2018-08-31 DIAGNOSIS — N400 Enlarged prostate without lower urinary tract symptoms: Secondary | ICD-10-CM | POA: Diagnosis not present

## 2018-08-31 DIAGNOSIS — E118 Type 2 diabetes mellitus with unspecified complications: Secondary | ICD-10-CM | POA: Diagnosis not present

## 2018-08-31 DIAGNOSIS — F419 Anxiety disorder, unspecified: Secondary | ICD-10-CM | POA: Diagnosis not present

## 2018-09-15 DIAGNOSIS — N183 Chronic kidney disease, stage 3 (moderate): Secondary | ICD-10-CM | POA: Diagnosis not present

## 2018-09-15 DIAGNOSIS — E782 Mixed hyperlipidemia: Secondary | ICD-10-CM | POA: Diagnosis not present

## 2018-09-15 DIAGNOSIS — E118 Type 2 diabetes mellitus with unspecified complications: Secondary | ICD-10-CM | POA: Diagnosis not present

## 2018-09-15 DIAGNOSIS — N400 Enlarged prostate without lower urinary tract symptoms: Secondary | ICD-10-CM | POA: Diagnosis not present

## 2018-09-15 DIAGNOSIS — D509 Iron deficiency anemia, unspecified: Secondary | ICD-10-CM | POA: Diagnosis not present

## 2018-09-15 DIAGNOSIS — F419 Anxiety disorder, unspecified: Secondary | ICD-10-CM | POA: Diagnosis not present

## 2018-09-15 DIAGNOSIS — I2510 Atherosclerotic heart disease of native coronary artery without angina pectoris: Secondary | ICD-10-CM | POA: Diagnosis not present

## 2018-09-15 DIAGNOSIS — I10 Essential (primary) hypertension: Secondary | ICD-10-CM | POA: Diagnosis not present

## 2018-09-15 DIAGNOSIS — R1013 Epigastric pain: Secondary | ICD-10-CM | POA: Diagnosis not present

## 2018-09-20 DIAGNOSIS — E782 Mixed hyperlipidemia: Secondary | ICD-10-CM | POA: Diagnosis not present

## 2018-09-20 DIAGNOSIS — N400 Enlarged prostate without lower urinary tract symptoms: Secondary | ICD-10-CM | POA: Diagnosis not present

## 2018-09-20 DIAGNOSIS — N183 Chronic kidney disease, stage 3 (moderate): Secondary | ICD-10-CM | POA: Diagnosis not present

## 2018-09-20 DIAGNOSIS — D509 Iron deficiency anemia, unspecified: Secondary | ICD-10-CM | POA: Diagnosis not present

## 2018-09-20 DIAGNOSIS — F419 Anxiety disorder, unspecified: Secondary | ICD-10-CM | POA: Diagnosis not present

## 2018-09-20 DIAGNOSIS — I2510 Atherosclerotic heart disease of native coronary artery without angina pectoris: Secondary | ICD-10-CM | POA: Diagnosis not present

## 2018-09-20 DIAGNOSIS — I10 Essential (primary) hypertension: Secondary | ICD-10-CM | POA: Diagnosis not present

## 2018-09-20 DIAGNOSIS — R1013 Epigastric pain: Secondary | ICD-10-CM | POA: Diagnosis not present

## 2018-09-20 DIAGNOSIS — E118 Type 2 diabetes mellitus with unspecified complications: Secondary | ICD-10-CM | POA: Diagnosis not present

## 2018-09-28 DIAGNOSIS — I739 Peripheral vascular disease, unspecified: Secondary | ICD-10-CM | POA: Diagnosis not present

## 2018-09-28 DIAGNOSIS — R0602 Shortness of breath: Secondary | ICD-10-CM | POA: Diagnosis not present

## 2018-09-28 DIAGNOSIS — E1142 Type 2 diabetes mellitus with diabetic polyneuropathy: Secondary | ICD-10-CM | POA: Diagnosis not present

## 2018-09-28 DIAGNOSIS — I5032 Chronic diastolic (congestive) heart failure: Secondary | ICD-10-CM | POA: Diagnosis not present

## 2018-09-30 ENCOUNTER — Inpatient Hospital Stay: Payer: Medicare PPO | Attending: Hematology

## 2018-09-30 DIAGNOSIS — I2510 Atherosclerotic heart disease of native coronary artery without angina pectoris: Secondary | ICD-10-CM | POA: Diagnosis not present

## 2018-09-30 DIAGNOSIS — K921 Melena: Secondary | ICD-10-CM | POA: Diagnosis not present

## 2018-09-30 DIAGNOSIS — C61 Malignant neoplasm of prostate: Secondary | ICD-10-CM | POA: Diagnosis not present

## 2018-09-30 DIAGNOSIS — R1033 Periumbilical pain: Secondary | ICD-10-CM | POA: Diagnosis not present

## 2018-09-30 DIAGNOSIS — D472 Monoclonal gammopathy: Secondary | ICD-10-CM | POA: Insufficient documentation

## 2018-09-30 DIAGNOSIS — I129 Hypertensive chronic kidney disease with stage 1 through stage 4 chronic kidney disease, or unspecified chronic kidney disease: Secondary | ICD-10-CM | POA: Insufficient documentation

## 2018-09-30 DIAGNOSIS — E1122 Type 2 diabetes mellitus with diabetic chronic kidney disease: Secondary | ICD-10-CM | POA: Insufficient documentation

## 2018-09-30 DIAGNOSIS — N189 Chronic kidney disease, unspecified: Secondary | ICD-10-CM | POA: Insufficient documentation

## 2018-09-30 DIAGNOSIS — R195 Other fecal abnormalities: Secondary | ICD-10-CM | POA: Diagnosis not present

## 2018-09-30 LAB — CMP (CANCER CENTER ONLY)
ALT: 17 U/L (ref 0–44)
AST: 17 U/L (ref 15–41)
Albumin: 3.3 g/dL — ABNORMAL LOW (ref 3.5–5.0)
Alkaline Phosphatase: 99 U/L (ref 38–126)
Anion gap: 9 (ref 5–15)
BUN: 29 mg/dL — ABNORMAL HIGH (ref 8–23)
CO2: 23 mmol/L (ref 22–32)
Calcium: 9.6 mg/dL (ref 8.9–10.3)
Chloride: 108 mmol/L (ref 98–111)
Creatinine: 1.63 mg/dL — ABNORMAL HIGH (ref 0.61–1.24)
GFR, Est AFR Am: 44 mL/min — ABNORMAL LOW (ref >60)
GFR, Estimated: 38 mL/min — ABNORMAL LOW (ref >60)
Glucose, Bld: 197 mg/dL — ABNORMAL HIGH (ref 70–99)
Potassium: 3.7 mmol/L (ref 3.5–5.1)
Sodium: 140 mmol/L (ref 135–145)
Total Bilirubin: 0.4 mg/dL (ref 0.3–1.2)
Total Protein: 7.4 g/dL (ref 6.5–8.1)

## 2018-09-30 LAB — CBC WITH DIFFERENTIAL/PLATELET
Abs Immature Granulocytes: 0.08 10*3/uL — ABNORMAL HIGH (ref 0.00–0.07)
Basophils Absolute: 0.1 10*3/uL (ref 0.0–0.1)
Basophils Relative: 1 %
Eosinophils Absolute: 0.2 10*3/uL (ref 0.0–0.5)
Eosinophils Relative: 2 %
HCT: 39.8 % (ref 39.0–52.0)
Hemoglobin: 12.7 g/dL — ABNORMAL LOW (ref 13.0–17.0)
Immature Granulocytes: 1 %
Lymphocytes Relative: 17 %
Lymphs Abs: 1.6 10*3/uL (ref 0.7–4.0)
MCH: 27.4 pg (ref 26.0–34.0)
MCHC: 31.9 g/dL (ref 30.0–36.0)
MCV: 85.8 fL (ref 80.0–100.0)
Monocytes Absolute: 0.8 10*3/uL (ref 0.1–1.0)
Monocytes Relative: 8 %
Neutro Abs: 6.4 10*3/uL (ref 1.7–7.7)
Neutrophils Relative %: 71 %
Platelets: 193 10*3/uL (ref 150–400)
RBC: 4.64 MIL/uL (ref 4.22–5.81)
RDW: 15.6 % — ABNORMAL HIGH (ref 11.5–15.5)
WBC: 9 10*3/uL (ref 4.0–10.5)
nRBC: 0 % (ref 0.0–0.2)

## 2018-09-30 NOTE — Progress Notes (Addendum)
[facility] Note  10/01/2018     CHIEF COMPLAINT Patient presents for Retina Follow Up   HISTORY OF PRESENT ILLNESS: Justin [name] is a 85 y.o. male who presents to the clinic today for:   HPI    Retina Follow Up    Patient presents with  Diabetic Retinopathy.  In both eyes.  This started 5 weeks ago.  Severity is moderate.  Duration of 5 weeks.  Since onset it is stable.  I, the attending physician,  performed the HPI with the patient and updated documentation appropriately.          Comments    Patient Here for 5 weeks NPDR OU. Patient states vision doing pretty good. Has no eye pain       Last edited by [name], MD on 10/01/2018  8:19 AM. (History)    pt states his vision seems to be doing pretty good, he states he had no problems after the last injections  Referring physician: [name], MD [place], [place] [address]  HISTORICAL INFORMATION:   Selected notes from the MEDICAL RECORD NUMBER Referred by Dr. [name] for concern of mac edema OU LEE:  Ocular Hx-pseudo OU; formerly followed at [facility] Dr. [name]. S/p focal laser OS x2, history of IVK and IVT OU PMH-anxiety, arthritis, CAD, DM (taking lantus and januvia), HTN, high cholesterol, prostate cancer    CURRENT MEDICATIONS: No current outpatient medications on file. (Ophthalmic Drugs)   No current facility-administered medications for this visit.  (Ophthalmic Drugs)   Current Outpatient Medications (Other)  Medication Sig  . acetaminophen (TYLENOL) 500 MG tablet Take 1,000 mg by mouth every 6 (six) hours as needed for moderate pain or headache.  . AMITIZA 24 MCG capsule Take 24 mcg by mouth daily as needed for constipation.   Marland Kitchen aspirin EC 81 MG tablet Take 81 mg by mouth daily.  . chlorthalidone (HYGROTON) 25 MG tablet Take 25 mg by mouth daily.   . Cholecalciferol (VITAMIN D3 PO) Take 1 capsule by mouth daily.  .  cilostazol (PLETAL) 100 MG tablet Take 100 mg by mouth 2 (two) times daily.  . clonazePAM (KLONOPIN) 0.5 MG tablet Take 0.5 mg by mouth daily as needed for anxiety.  . Cyanocobalamin (VITAMIN B-12 PO) Take 1 tablet by mouth daily.  . DULoxetine HCl 40 MG CPEP Take 1 capsule by mouth 2 (two) times daily.  . isosorbide mononitrate (IMDUR) 60 MG 24 hr tablet Take 60 mg by mouth daily.  Marland Kitchen JANUVIA 50 MG tablet Take 50 mg by mouth 2 (two) times daily.  Marland Kitchen LANTUS SOLOSTAR 100 UNIT/ML Solostar Pen Inject 35 Units into the skin 2 (two) times daily.   . metoprolol (LOPRESSOR) 50 MG tablet Take 50 mg by mouth 2 (two) times daily.  . nitroGLYCERIN (NITROSTAT) 0.4 MG SL tablet Place 0.4 mg under the tongue every 5 (five) minutes as needed for chest pain.   Marland Kitchen omeprazole (PRILOSEC) 20 MG capsule Take 20 mg by mouth daily.  . predniSONE (DELTASONE) 20 MG tablet Take 2 tablets daily with breakfast.  . pregabalin (LYRICA) 50 MG capsule Take 50 mg by mouth 3 (three) times daily.  . Pyridoxine HCl (VITAMIN B-6 PO) Take 1 tablet by mouth daily.  . rosuvastatin (CRESTOR) 20 MG tablet TAKE 1 TABLET BY MOUTH ONCE DAILY DISCONTINUE SIMVASTATIN  . simvastatin (ZOCOR) 20 MG tablet Take 20 mg by mouth every evening.  Marland Kitchen  tamsulosin (FLOMAX) 0.4 MG CAPS capsule Take 0.4 mg by mouth daily.   Current Facility-Administered Medications (Other)  Medication Route  . Bevacizumab (AVASTIN) SOLN 1.25 mg Intravitreal  . Bevacizumab (AVASTIN) SOLN 1.25 mg Intravitreal  . Bevacizumab (AVASTIN) SOLN 1.25 mg Intravitreal  . Bevacizumab (AVASTIN) SOLN 1.25 mg Intravitreal  . Bevacizumab (AVASTIN) SOLN 1.25 mg Intravitreal  . Bevacizumab (AVASTIN) SOLN 1.25 mg Intravitreal  . Bevacizumab (AVASTIN) SOLN 1.25 mg Intravitreal  . Bevacizumab (AVASTIN) SOLN 1.25 mg Intravitreal      REVIEW OF SYSTEMS: ROS    Positive for: Endocrine, Eyes, Psychiatric   Negative for: Constitutional, Gastrointestinal, Neurological, Skin, Genitourinary,  Musculoskeletal, HENT, Cardiovascular, Respiratory, Allergic/Imm, Heme/Lymph   Last edited by [name], MD on 10/03/2018 11:42 PM. (History)       ALLERGIES Allergies  Allergen Reactions  . Dye Fdc Red [Red Dye] Swelling and Other (See Comments)    CAT scan dye  . Ibuprofen Other (See Comments)    Upset stomach     PAST MEDICAL HISTORY Past Medical History:  Diagnosis Date  . Anxiety   . Arthritis   . Coronary artery disease   . Diabetes mellitus without complication ([place])   . Diverticulitis   . Dyspnea   . GERD (gastroesophageal reflux disease)   . Hypercholesteremia   . Hypertension   . Pneumonia    as a child  . Prostate cancer ([place])   . UTI (lower urinary tract infection)    Past Surgical History:  Procedure Laterality Date  . ABDOMINAL SURGERY     for diverticulitis; also removed appendix  . CATARACT EXTRACTION    . CATARACT EXTRACTION, BILATERAL    . CHOLECYSTECTOMY N/A 10/12/2017   Procedure: LAPAROSCOPIC CHOLECYSTECTOMY;  Surgeon: [name], MD;  Location: [facility];  Service: General;  Laterality: N/A;  . CORONARY ARTERY BYPASS GRAFT  2009  . EYE SURGERY     "for bleeding in eye"  . HERNIA REPAIR    . LEFT HEART CATH AND CORONARY ANGIOGRAPHY N/A 11/04/2016   Procedure: Left Heart Cath and Coronary Angiography;  Surgeon: [name], MD;  Location: [facility];  Service: Cardiovascular;  Laterality: N/A;  . LOWER EXTREMITY ANGIOGRAPHY N/A 11/04/2016   Procedure: Lower Extremity Angiography;  Surgeon: [name], MD;  Location: [facility];  Service: Cardiovascular;  Laterality: N/A;  . PROSTATE SURGERY      FAMILY HISTORY Family History  Problem Relation Age of Onset  . Diabetes Father   . Diabetes Maternal Aunt   . Diabetes Maternal Uncle   . Diabetes Maternal Grandmother     SOCIAL HISTORY Social History   Tobacco Use  . Smoking status: Former Research scientist (life sciences)  . Smokeless tobacco: Never Used  Substance Use Topics  . Alcohol use: No  .  Drug use: No         OPHTHALMIC EXAM:  Base Eye Exam    Visual Acuity (Snellen - Linear)      Right Left   Dist cc 20/25 -2 20/30 -1   Dist ph cc NI 20/25 -1   Correction:  Glasses       Tonometry (Tonopen, 8:05 AM)      Right Left   Pressure 10 12       Pupils      Dark Light Shape React APD   Right 2 1.5 Round Minimal None   Left 2 1.5 Round Minimal None       Visual Fields (Counting fingers)  Left Right    Full Full       Extraocular Movement      Right Left    Full Full       Neuro/Psych    Oriented x3:  Yes   Mood/Affect:  Normal       Dilation    Both eyes:  1.0% Mydriacyl, 2.5% Phenylephrine @ 8:05 AM        Slit Lamp and Fundus Exam    Slit Lamp Exam      Right Left   Lids/Lashes Dermatochalasis - upper lid, Meibomian gland dysfunction Dermatochalasis - upper lid, Meibomian gland dysfunction   Conjunctiva/Sclera Melanosis Melanosis   Cornea Arcus, 1+ Punctate epithelial erosions Arcus, 1+ Punctate epithelial erosions   Anterior Chamber Deep and quiet Deep and quiet   Iris Mild Temporal Iris atrophy, Round and dilated, No NVI Mild Temporal Iris atrophy, Round and dilated, No NVI   Lens PC IOL in good position with mild aterior capsule fimosis PC IOL in good position with mild aterior capsule fimosis   Vitreous Vitreous syneresis, Posterior vitreous detachment Vitreous syneresis, refractile deposits in posterior hyaloid, mild Asteroid hyalosis       Fundus Exam      Right Left   Disc Pink and Sharp, Peripapillary atrophy Tilted disc, mild temporal Temporal Peripapillary atrophy   C/D Ratio 0.3 0.3   Macula Blunted foveal reflex, Retinal pigment epithelial mottling and clumping, Microaneurysms, cystic changes Blunted foveal reflex, focal area of RPE atrophy SN to fovea, Microaneurysms, Retinal pigment epithelial mottling, refractile Epiretinal membrane   Vessels Tortuous, AV crossing changes Tortuous, Vascular attenuation   Periphery Attached,  scattered MA Attached, flame heme at 1100 midzone        Refraction    Wearing Rx      Sphere Cylinder Axis Add   Right +0.25 +0.50 010 +3.00   Left [place] +0.75 173 +3.00   Type:  Bifocal          IMAGING AND PROCEDURES  Imaging and Procedures for _0 @  OCT, Retina - OU - Both Eyes       Right Eye Quality was good. Central Foveal Thickness: 394. Progression has worsened. Findings include abnormal foveal contour, intraretinal fluid, no SRF, retinal drusen , outer retinal atrophy (Interval increase in IRF).   Left Eye Quality was good. Central Foveal Thickness: 293. Progression has been stable. Findings include abnormal foveal contour, no SRF, intraretinal fluid, outer retinal atrophy, retinal drusen  (Mild interval increase in IRF/cystic changes).   Notes *Images captured and stored on drive  Diagnosis / Impression:  DME OU, OD>OS OD - interval increase in IRF OS - persistent IRF w/ focal areas of outer retinal atrophy -- stable from prior  Clinical management:  See below  Abbreviations: NFP - Normal foveal profile. CME - cystoid macular edema. PED - pigment epithelial detachment. IRF - intraretinal fluid. SRF - subretinal fluid. EZ - ellipsoid zone. ERM - epiretinal membrane. ORA - outer retinal atrophy. ORT - outer retinal tubulation. SRHM - subretinal hyper-reflective material         Intravitreal Injection, Pharmacologic Agent - OD - Right Eye       Time Out 10/01/2018. 8:40 AM. Confirmed correct patient, procedure, site, and patient consented.   Anesthesia Topical anesthesia was used. Anesthetic medications included Lidocaine 2%, Proparacaine 0.5%.   Procedure Preparation included 5% betadine to ocular surface, eyelid speculum. A 30 gauge needle was used.   Injection:  1.25 mg Bevacizumab (AVASTIN) SOLN  NDC: 38182-993-71, Lot: [id number] , Expiration date: 11/19/2018   Route: Intravitreal, Site: Right Eye, Waste: 0 mL  Post-op Post injection  exam found visual acuity of at least counting fingers. The patient tolerated the procedure well. There were no complications. The patient received written and verbal post procedure care education.        Intravitreal Injection, Pharmacologic Agent - OS - Left Eye       Time Out 10/01/2018. 8:40 AM. Confirmed correct patient, procedure, site, and patient consented.   Anesthesia Topical anesthesia was used. Anesthetic medications included Lidocaine 2%, Proparacaine 0.5%.   Procedure Preparation included 5% betadine to ocular surface, eyelid speculum. A supplied needle was used.   Injection:  1.25 mg Bevacizumab (AVASTIN) SOLN   NDC: 50932-671-24, Lot: [id number] , Expiration date: 11/22/2018   Route: Intravitreal, Site: Left Eye, Waste: 0 mL  Post-op Post injection exam found visual acuity of at least counting fingers. The patient tolerated the procedure well. There were no complications. The patient received written and verbal post procedure care education.                 ASSESSMENT/PLAN:    ICD-10-CM   1. Moderate nonproliferative diabetic retinopathy of both eyes with macular edema associated with type 2 diabetes mellitus (HCC) P80.9983 Intravitreal Injection, Pharmacologic Agent - OD - Right Eye    Intravitreal Injection, Pharmacologic Agent - OS - Left Eye    Bevacizumab (AVASTIN) SOLN 1.25 mg    Bevacizumab (AVASTIN) SOLN 1.25 mg  2. Retinal edema H35.81 OCT, Retina - OU - Both Eyes  3. Essential hypertension I10   4. Hypertensive retinopathy of both eyes H35.033   5. Pseudophakia of both eyes Z96.1     1,2. Moderate Non-proliferative diabetic retinopathy with edema OU - moved to [facility] from [place], [place] -- history of prior injections OS with Dr. [name] - records received and reviewed from [facility] (Dr. [name]) - history of focal laser OS x2 and IVK/IVT OU in [place] -- last injection was IVK OS November 2013 - exam scattered IRH, DBH and  +macular edema - OCT shows diabetic macular edema, both eyes, (OD > OS) - S/P IVA OD #1 (09.17.19), #2 (10.29.19), #3 (11.26.19) - S/P IVA OS #1 (10.01.19), #2 (10.29.19), #3 (11.26.19) - FA (10.1.19) showed late leaking MA OU -- no NV - today, OCT shows persistent IRF OU; BCVA 20/25 OU - recommend IVA OU #4 today (01.02.20) - pt wishes to proceed - RBA of procedure discussed, questions answered - informed consent obtained and signed - see procedure note - slight delay in F/U due to the holidays -- 5 weeks - f/u in 4 weeks, DFE, OCT   3,4. Hypertensive retinopathy OU - discussed importance of tight BP control - monitor  5. Pseudophakia OU  - s/p CE/IOL OU in [place], [place]  - beautiful surgeries, doing well  - monitor   Ophthalmic Meds Ordered this visit:  Meds ordered this encounter  Medications  . Bevacizumab (AVASTIN) SOLN 1.25 mg  . Bevacizumab (AVASTIN) SOLN 1.25 mg       Return in about 4 weeks (around 10/29/2018) for F/U NPDR OU, OCT, DFE.  There are no Patient Instructions on file for this visit.   Explained the diagnoses, plan, and follow up with the patient and they expressed understanding.  Patient expressed understanding of the importance of proper follow up care.   This document serves as a record of services personally performed by Gardiner Sleeper, MD, PhD.  It was created on their behalf by Ernest Mallick, OA, an ophthalmic assistant. The creation of this record is the provider's dictation and/or activities during the visit.    Electronically signed by: Ernest Mallick, OA  01.02.2020 11:45 PM    Gardiner Sleeper, M.D., Ph.D. Diseases & Surgery of the Retina and Vitreous [facility]  I have reviewed the above documentation for accuracy and completeness, and I agree with the above. Gardiner Sleeper, M.D., Ph.D. 10/03/18 11:45 PM    Abbreviations: M myopia (nearsighted); A astigmatism; H hyperopia (farsighted); P presbyopia; Mrx spectacle  prescription;  CTL contact lenses; OD right eye; OS left eye; OU both eyes  XT exotropia; ET esotropia; PEK punctate epithelial keratitis; PEE punctate epithelial erosions; DES dry eye syndrome; MGD meibomian gland dysfunction; ATs artificial tears; PFAT's preservative free artificial tears; [place] nuclear sclerotic cataract; PSC posterior subcapsular cataract; ERM epi-retinal membrane; PVD posterior vitreous detachment; RD retinal detachment; DM diabetes mellitus; DR diabetic retinopathy; NPDR non-proliferative diabetic retinopathy; PDR proliferative diabetic retinopathy; CSME clinically significant macular edema; DME diabetic macular edema; dbh dot blot hemorrhages; CWS cotton wool spot; POAG primary open angle glaucoma; C/D cup-to-disc ratio; HVF humphrey visual field; GVF goldmann visual field; OCT optical coherence tomography; IOP intraocular pressure; BRVO Branch retinal vein occlusion; CRVO central retinal vein occlusion; CRAO central retinal artery occlusion; BRAO branch retinal artery occlusion; RT retinal tear; SB scleral buckle; PPV pars plana vitrectomy; VH Vitreous hemorrhage; PRP panretinal laser photocoagulation; IVK intravitreal kenalog; VMT vitreomacular traction; MH Macular hole;  NVD neovascularization of the disc; NVE neovascularization elsewhere; AREDS age related eye disease study; ARMD age related macular degeneration; POAG primary open angle glaucoma; EBMD epithelial/anterior basement membrane dystrophy; ACIOL anterior chamber intraocular lens; IOL intraocular lens; PCIOL posterior chamber intraocular lens; Phaco/IOL phacoemulsification with intraocular lens placement; [place] photorefractive keratectomy; LASIK laser assisted in situ keratomileusis; HTN hypertension; DM diabetes mellitus; COPD chronic obstructive pulmonary disease

## 2018-10-01 ENCOUNTER — Ambulatory Visit (INDEPENDENT_AMBULATORY_CARE_PROVIDER_SITE_OTHER): Payer: Medicare PPO | Admitting: Ophthalmology

## 2018-10-01 DIAGNOSIS — H35033 Hypertensive retinopathy, bilateral: Secondary | ICD-10-CM

## 2018-10-01 DIAGNOSIS — H3581 Retinal edema: Secondary | ICD-10-CM | POA: Diagnosis not present

## 2018-10-01 DIAGNOSIS — Z961 Presence of intraocular lens: Secondary | ICD-10-CM

## 2018-10-01 DIAGNOSIS — I10 Essential (primary) hypertension: Secondary | ICD-10-CM

## 2018-10-01 DIAGNOSIS — E113313 Type 2 diabetes mellitus with moderate nonproliferative diabetic retinopathy with macular edema, bilateral: Secondary | ICD-10-CM

## 2018-10-01 MED ORDER — BEVACIZUMAB CHEMO INJECTION 1.25MG/0.05ML SYRINGE FOR KALEIDOSCOPE
1.2500 mg | INTRAVITREAL | Status: DC
  Administered 2018-10-01: 1.25 mg via INTRAVITREAL

## 2018-10-03 ENCOUNTER — Encounter (INDEPENDENT_AMBULATORY_CARE_PROVIDER_SITE_OTHER): Payer: Self-pay | Admitting: Ophthalmology

## 2018-10-04 LAB — MULTIPLE MYELOMA PANEL, SERUM
Albumin SerPl Elph-Mcnc: 3.5 g/dL (ref 2.9–4.4)
Albumin/Glob SerPl: 1.1 (ref 0.7–1.7)
Alpha 1: 0.2 g/dL (ref 0.0–0.4)
Alpha2 Glob SerPl Elph-Mcnc: 0.8 g/dL (ref 0.4–1.0)
B-Globulin SerPl Elph-Mcnc: 1 g/dL (ref 0.7–1.3)
Gamma Glob SerPl Elph-Mcnc: 1.3 g/dL (ref 0.4–1.8)
Globulin, Total: 3.3 g/dL (ref 2.2–3.9)
IgA: 168 mg/dL (ref 61–437)
IgG (Immunoglobin G), Serum: 1489 mg/dL (ref 700–1600)
IgM (Immunoglobulin M), Srm: 15 mg/dL (ref 15–143)
M Protein SerPl Elph-Mcnc: 0.5 g/dL — ABNORMAL HIGH
Total Protein ELP: 6.8 g/dL (ref 6.0–8.5)

## 2018-10-05 DIAGNOSIS — D509 Iron deficiency anemia, unspecified: Secondary | ICD-10-CM | POA: Diagnosis not present

## 2018-10-05 DIAGNOSIS — K449 Diaphragmatic hernia without obstruction or gangrene: Secondary | ICD-10-CM | POA: Diagnosis not present

## 2018-10-05 DIAGNOSIS — R195 Other fecal abnormalities: Secondary | ICD-10-CM | POA: Diagnosis not present

## 2018-10-05 DIAGNOSIS — D123 Benign neoplasm of transverse colon: Secondary | ICD-10-CM | POA: Diagnosis not present

## 2018-10-05 DIAGNOSIS — K635 Polyp of colon: Secondary | ICD-10-CM | POA: Diagnosis not present

## 2018-10-05 DIAGNOSIS — K921 Melena: Secondary | ICD-10-CM | POA: Diagnosis not present

## 2018-10-05 DIAGNOSIS — K5730 Diverticulosis of large intestine without perforation or abscess without bleeding: Secondary | ICD-10-CM | POA: Diagnosis not present

## 2018-10-06 NOTE — Progress Notes (Signed)
HEMATOLOGY/ONCOLOGY CLINIC NOTE  Date of Service: 10/07/2018  Patient Care Team: [name], MD as PCP - General (Internal Medicine)  CHIEF COMPLAINTS/PURPOSE OF CONSULTATION:   Monoclonal Paraproteinemia  HISTORY OF PRESENTING ILLNESS:   Justin [name] is a wonderful 85 y.o. male who has been referred to [place] by his nephorlogist for evaluation and management of positive M-spike.   Patient has h/o HTN, DM2 (with retinopathy),CKD, CAD s/p quadruple bypass surgery, HLD, anxiety , h/o Prostate cancer s/p Ablation and presents to the clinic today accompanied by his daughter.   He notes he has comorbidities of CKD that is being managed by his nephrologist Dr Elmarie Shiley MD and notes that this is thought to be related to his HTN and DM2. He notes that he has had DM for 35 years. He is on insulin along with his other DM medication. He notes that he has also had long standing HTN.  As part of his CKD workup with his nephrologist he has SPEP and SFLC were done which showed an M spike of 0.7g/dl with normal SFLC K/L ratio. (M spike was 0.5g/dl in 08/2016)   He has a creatinine of 1.52 with normal calcium levels of 9.5 HGB 13.3 Patient reports no focal bone pains.  No acute new fatigue or change in functional status.  He had his gall bladder removed in 09/2017. He has healed well from that.   On review of symptoms, pt noted chronic back pain. He notes he is more short of breath when going to the bathroom or walking short distance. He notes poor circulation and neuropathy in the legs. He notes they feel swollen but do not appear swollen. He notes ongoing abdominal pain. His daughter notes an unexplained rash seen on his hands, scalp and back of neck. This has been present for over a month and makes him itch. He is not sure what this can be associated with. His newest medication is Lyrica for a few months. He denies food allergy.   Interval History:   Justin [name] returns today regarding  his monoclonal paraproteinemia. The patient's last visit with [place] was on 03/30/18. He is accompanied today by his daughter and granddaughter. The pt reports that he is doing well overall.   The pt reports that he recently had a colonoscopy and endoscopy which did not reveal anything concerning. Besides this, he denies new development in his medical history.   The pt denies any new concerns in the interim. He intentionally lost weight in the interim through diet and exercise. He denies any new bone pains, fevers, chills, or night sweats. He endorses chronic back pain, and denies this changing in the interim.   Lab results (09/30/18) of CBC w/diff and CMP is as follows: all values are WNL except for HGB at 12.7, RDW at 15.6, Abs immature granulocytes at 0.08k, Glucose at 197, BUN at 29, Creatinine at 1.63, Albumin at 3.3, GFR at 44. 09/30/17 MMP revealed all values WNL except for M Protein at 0.5 with IgG Kappa light chain specificity.  On review of systems, pt reports intentional weight loss, eating well, keeping active, chronic back pain, and denies unexpected weight loss, new bone pains, new back pains, and any other symptoms.   MEDICAL HISTORY:  Past Medical History:  Diagnosis Date  . Anxiety   . Arthritis   . Coronary artery disease   . Diabetes mellitus without complication ([place])   . Diverticulitis   . Dyspnea   . GERD (gastroesophageal reflux  disease)   . Hypercholesteremia   . Hypertension   . Pneumonia    as a child  . Prostate cancer ([place])   . UTI (lower urinary tract infection)     SURGICAL HISTORY: Past Surgical History:  Procedure Laterality Date  . ABDOMINAL SURGERY     for diverticulitis; also removed appendix  . CATARACT EXTRACTION    . CATARACT EXTRACTION, BILATERAL    . CHOLECYSTECTOMY N/A 10/12/2017   Procedure: LAPAROSCOPIC CHOLECYSTECTOMY;  Surgeon: [name], MD;  Location: [facility];  Service: General;  Laterality: N/A;  . CORONARY ARTERY BYPASS GRAFT  2009  .  EYE SURGERY     "for bleeding in eye"  . HERNIA REPAIR    . LEFT HEART CATH AND CORONARY ANGIOGRAPHY N/A 11/04/2016   Procedure: Left Heart Cath and Coronary Angiography;  Surgeon: [name], MD;  Location: [facility];  Service: Cardiovascular;  Laterality: N/A;  . LOWER EXTREMITY ANGIOGRAPHY N/A 11/04/2016   Procedure: Lower Extremity Angiography;  Surgeon: [name], MD;  Location: [facility];  Service: Cardiovascular;  Laterality: N/A;  . PROSTATE SURGERY      SOCIAL HISTORY: Social History   Socioeconomic History  . Marital status: Widowed    Spouse name: Not on file  . Number of children: Not on file  . Years of education: Not on file  . Highest education level: Not on file  Occupational History  . Not on file  Social Needs  . Financial resource strain: Not on file  . Food insecurity:    Worry: Not on file    Inability: Not on file  . Transportation needs:    Medical: Not on file    Non-medical: Not on file  Tobacco Use  . Smoking status: Former Research scientist (life sciences)  . Smokeless tobacco: Never Used  Substance and Sexual Activity  . Alcohol use: No  . Drug use: No  . Sexual activity: Not on file  Lifestyle  . Physical activity:    Days per week: Not on file    Minutes per session: Not on file  . Stress: Not on file  Relationships  . Social connections:    Talks on phone: Not on file    Gets together: Not on file    Attends religious service: Not on file    Active member of club or organization: Not on file    Attends meetings of clubs or organizations: Not on file    Relationship status: Not on file  . Intimate partner violence:    Fear of current or ex partner: Not on file    Emotionally abused: Not on file    Physically abused: Not on file    Forced sexual activity: Not on file  Other Topics Concern  . Not on file  Social History Narrative  . Not on file    FAMILY HISTORY: Family History  Problem Relation Age of Onset  . Diabetes Father   . Diabetes  Maternal Aunt   . Diabetes Maternal Uncle   . Diabetes Maternal Grandmother     ALLERGIES:  is allergic to dye fdc red [red dye] and ibuprofen.  MEDICATIONS:  Current Outpatient Medications  Medication Sig Dispense Refill  . acetaminophen (TYLENOL) 500 MG tablet Take 1,000 mg by mouth every 6 (six) hours as needed for moderate pain or headache.    . AMITIZA 24 MCG capsule Take 24 mcg by mouth daily as needed for constipation.     Marland Kitchen aspirin EC  81 MG tablet Take 81 mg by mouth daily.    . chlorthalidone (HYGROTON) 25 MG tablet Take 25 mg by mouth daily.     . Cholecalciferol (VITAMIN D3 PO) Take 1 capsule by mouth daily.    . cilostazol (PLETAL) 100 MG tablet Take 100 mg by mouth 2 (two) times daily.    . clonazePAM (KLONOPIN) 0.5 MG tablet Take 0.5 mg by mouth daily as needed for anxiety.    . Cyanocobalamin (VITAMIN B-12 PO) Take 1 tablet by mouth daily.    . DULoxetine HCl 40 MG CPEP Take 1 capsule by mouth 2 (two) times daily.  3  . isosorbide mononitrate (IMDUR) 60 MG 24 hr tablet Take 60 mg by mouth daily.    Marland Kitchen JANUVIA 50 MG tablet Take 50 mg by mouth 2 (two) times daily.    Marland Kitchen LANTUS SOLOSTAR 100 UNIT/ML Solostar Pen Inject 35 Units into the skin 2 (two) times daily.     . metoprolol (LOPRESSOR) 50 MG tablet Take 50 mg by mouth 2 (two) times daily.    . nitroGLYCERIN (NITROSTAT) 0.4 MG SL tablet Place 0.4 mg under the tongue every 5 (five) minutes as needed for chest pain.     Marland Kitchen omeprazole (PRILOSEC) 20 MG capsule Take 20 mg by mouth daily.    . predniSONE (DELTASONE) 20 MG tablet Take 2 tablets daily with breakfast. 10 tablet 0  . pregabalin (LYRICA) 50 MG capsule Take 50 mg by mouth 3 (three) times daily.    . Pyridoxine HCl (VITAMIN B-6 PO) Take 1 tablet by mouth daily.    . rosuvastatin (CRESTOR) 20 MG tablet TAKE 1 TABLET BY MOUTH ONCE DAILY DISCONTINUE SIMVASTATIN  1  . simvastatin (ZOCOR) 20 MG tablet Take 20 mg by mouth every evening.    . tamsulosin (FLOMAX) 0.4 MG CAPS  capsule Take 0.4 mg by mouth daily.     Current Facility-Administered Medications  Medication Dose Route Frequency Provider Last Rate Last Dose  . Bevacizumab (AVASTIN) SOLN 1.25 mg  1.25 mg Intravitreal  [name], MD   1.25 mg at 06/15/18 1328  . Bevacizumab (AVASTIN) SOLN 1.25 mg  1.25 mg Intravitreal  [name], MD   1.25 mg at 06/29/18 1244  . Bevacizumab (AVASTIN) SOLN 1.25 mg  1.25 mg Intravitreal  [name], MD   1.25 mg at 07/28/18 0904  . Bevacizumab (AVASTIN) SOLN 1.25 mg  1.25 mg Intravitreal  [name], MD   1.25 mg at 07/28/18 0905  . Bevacizumab (AVASTIN) SOLN 1.25 mg  1.25 mg Intravitreal  [name], MD   1.25 mg at 08/24/18 1000  . Bevacizumab (AVASTIN) SOLN 1.25 mg  1.25 mg Intravitreal  [name], MD   1.25 mg at 08/24/18 1000  . Bevacizumab (AVASTIN) SOLN 1.25 mg  1.25 mg Intravitreal  [name], MD   1.25 mg at 10/01/18 0949  . Bevacizumab (AVASTIN) SOLN 1.25 mg  1.25 mg Intravitreal  [name], MD   1.25 mg at 10/01/18 0932    REVIEW OF SYSTEMS:    A 10+ POINT REVIEW OF SYSTEMS WAS OBTAINED including neurology, dermatology, psychiatry, cardiac, respiratory, lymph, extremities, GI, GU, Musculoskeletal, constitutional, breasts, reproductive, HEENT.  All pertinent positives are noted in the HPI.  All others are negative.   PHYSICAL EXAMINATION: ECOG PERFORMANCE STATUS: 2 - Symptomatic, <50% confined to bed  . Vitals:   10/07/18 1015  BP: (!) 141/58  Pulse: 82  Resp: 18  Temp: 98 F (36.7 C)  SpO2: 100%   Filed Weights   10/07/18 1015  Weight: 182 lb 11.2 oz (82.9 kg)   .Body mass index is 28.19 kg/m.  GENERAL:alert, in no acute distress and comfortable SKIN: no acute rashes, no significant lesions EYES: conjunctiva are pink and non-injected, sclera anicteric OROPHARYNX: MMM, no exudates, no oropharyngeal erythema or ulceration NECK: supple, no JVD LYMPH:  no palpable lymphadenopathy in the cervical, axillary or inguinal  regions LUNGS: clear to auscultation b/l with normal respiratory effort HEART: regular rate & rhythm ABDOMEN:  normoactive bowel sounds , non tender, not distended. No palpable hepatosplenomegaly.  Extremity: 1+ pedal edema PSYCH: alert & oriented x 3 with fluent speech NEURO: no focal motor/sensory deficits   LABORATORY DATA:  I have reviewed the data as listed  . CBC Latest Ref Rng & Units 09/30/2018 03/30/2018 12/01/2017  WBC 4.0 - 10.5 K/uL 9.0 8.6 8.3  Hemoglobin 13.0 - 17.0 g/dL 12.7(L) 13.5 13.2  Hematocrit 39.0 - 52.0 % 39.8 40.9 41.1  Platelets 150 - 400 K/uL 193 158 155  HGB 13.2  . CBC    Component Value Date/Time   WBC 9.0 09/30/2018 1023   RBC 4.64 09/30/2018 1023   HGB 12.7 (L) 09/30/2018 1023   HGB 13.5 03/30/2018 0848   HCT 39.8 09/30/2018 1023   PLT 193 09/30/2018 1023   PLT 158 03/30/2018 0848   MCV 85.8 09/30/2018 1023   MCH 27.4 09/30/2018 1023   MCHC 31.9 09/30/2018 1023   RDW 15.6 (H) 09/30/2018 1023   LYMPHSABS 1.6 09/30/2018 1023   MONOABS 0.8 09/30/2018 1023   EOSABS 0.2 09/30/2018 1023   BASOSABS 0.1 09/30/2018 1023     . CMP Latest Ref Rng & Units 09/30/2018 03/30/2018 12/01/2017  Glucose 70 - 99 mg/dL 197(H) 315(H) 235(H)  BUN 8 - 23 mg/dL 29(H) 23 43(H)  Creatinine 0.61 - 1.24 mg/dL 1.63(H) 1.61(H) 1.71(H)  Sodium 135 - 145 mmol/L 140 137 141  Potassium 3.5 - 5.1 mmol/L 3.7 4.2 4.7  Chloride 98 - 111 mmol/L 108 107 113(H)  CO2 22 - 32 mmol/L 23 23 21(L)  Calcium 8.9 - 10.3 mg/dL 9.6 9.8 9.5  Total Protein 6.5 - 8.1 g/dL 7.4 7.1 7.1  Total Bilirubin 0.3 - 1.2 mg/dL 0.4 0.4 0.3  Alkaline Phos 38 - 126 U/L 99 91 78  AST 15 - 41 U/L 17 12(L) 14  ALT 0 - 44 U/L 17 11 16    . Lab Results  Component Value Date   LDH 150 12/01/2017   IFE 1  Comment VC  Comment: (NOTE)  Immunofixation shows IgG monoclonal protein with kappa light chain  specificity.     OUTSIDE LABS      Component     Latest Ref Rng & Units 12/01/2017 03/30/2018  IgG  (Immunoglobin G), Serum     700 - 1,600 mg/dL 1,595 1,341  IgA     61 - 437 mg/dL 160 147  IgM (Immunoglobulin M), Srm     15 - 143 mg/dL 14 (L) 14 (L)  Total Protein ELP     6.0 - 8.5 g/dL 6.5 6.7  Albumin SerPl Elph-Mcnc     2.9 - 4.4 g/dL 3.2 3.5  Alpha 1     0.0 - 0.4 g/dL 0.2 0.2  Alpha2 Glob SerPl Elph-Mcnc     0.4 - 1.0 g/dL 0.7 0.8  B-Globulin SerPl Elph-Mcnc     0.7 - 1.3 g/dL 0.9 1.0  Gamma Glob SerPl Elph-Mcnc  0.4 - 1.8 g/dL 1.4 1.2  M Protein SerPl Elph-Mcnc     Not Observed g/dL 0.5 (H) 0.6 (H)  Globulin, Total     2.2 - 3.9 g/dL 3.3 3.2  Albumin/Glob SerPl     0.7 - 1.7 1.0 1.1  IFE 1      Comment Comment  Please Note (HCV):      Comment Comment  Kappa free light chain     3.3 - 19.4 mg/L 29.2 (H) 26.7 (H)  Lamda free light chains     5.7 - 26.3 mg/L 18.2 17.6  Kappa, lamda light chain ratio     0.26 - 1.65 1.60 1.52     RADIOGRAPHIC STUDIES:  I have personally reviewed the radiological images as listed and agreed with the findings in the report.   Bone survey 12/08/2017: IMPRESSION: Negative examination. No lytic lesions. Ordinary degenerative changes of the spine and sacroiliac joints. Ankylosis of the sacroiliac joints. Vascular calcification.  ASSESSMENT & PLAN:   Justin [name] is a 85 y.o. African American male with    1. IgG Kappa Monoclonal Paraproteinemia. M spike 0.5g/dl (appears to be non progressive since 2017 when it was also 0.5g/dl and 0.7g/dl in 06/2017) Normal Kappa/Lambda ratio  Normal Hg and Ca level, no new focal bone pain  Has CKD - likely related to HTN and DM2 (long standing) Overall low likelihood of Myeloma.  Likely Represents MGUS (Monoclonal gammopathy of undetermined significance. Nl sed rate and LDH 12/08/17 DG Bone Survey did not reveal any lytic lesions   12/01/17 UPEP did not observe M spike  PLAN:  -Discussed pt labwork from 09/30/18; M Protein stable at 0.5, HGB at 12.7, blood counts and chemistries are stable    -Discussed that the patient's M Protein has been stable for more than a year now, which is reassuring that the process is reflective of MGUS -Discussed the CRAB criteria again with the pt, Creatinine has been stable with CKD, Calcium normal, minimal anemia -Fast-moving plasma cell disorder is not indicated -No indication for BM Bx at this time -Continue follow up with PCP and nephrologist  -Pt will let me know if he develops new bone pains in the inteirm -Will see the pt back in one year  2. CKD -likely related to HTN and DM2 -Mx by Dr Posey Pronto - nephrology -On Ramipril   3. Past Medical History:  Diagnosis Date  . Anxiety   . Arthritis   . Coronary artery disease   . Diabetes mellitus without complication ([place])   . Diverticulitis   . Dyspnea   . GERD (gastroesophageal reflux disease)   . Hypercholesteremia   . Hypertension   . Pneumonia    as a child  . Prostate cancer ([place])   . UTI (lower urinary tract infection)   --diabetic neuropathy. -- previously on Gabapentin, currently on Lyrica.  -Mx by PCP   RTC with Dr Irene Limbo with labs in 12 months. Plz schedule labs 1 week prior to clinic visit    All of the patients questions were answered with apparent satisfaction. The patient knows to call the clinic with any problems, questions or concerns.  The total time spent in the appt was 20 minutes and more than 50% was on counseling and direct patient cares.    Sullivan Lone MD [place] AAHIVMS [facility] Hematology/Oncology Physician [facility]  (Office):       [phone] (Work cell):  [phone] (Fax):  [phone]  10/07/2018 10:46 AM  I, Baldwin Jamaica, am acting as a scribe for Dr. Sullivan Lone.   .I have reviewed the above documentation for accuracy and completeness, and I agree with the above. Brunetta Genera MD

## 2018-10-07 ENCOUNTER — Telehealth: Payer: Self-pay | Admitting: Hematology

## 2018-10-07 ENCOUNTER — Inpatient Hospital Stay: Payer: Medicare PPO | Admitting: Hematology

## 2018-10-07 VITALS — BP 141/58 | HR 82 | Temp 98.0°F | Resp 18 | Ht 67.5 in | Wt 182.7 lb

## 2018-10-07 DIAGNOSIS — I129 Hypertensive chronic kidney disease with stage 1 through stage 4 chronic kidney disease, or unspecified chronic kidney disease: Secondary | ICD-10-CM

## 2018-10-07 DIAGNOSIS — I2510 Atherosclerotic heart disease of native coronary artery without angina pectoris: Secondary | ICD-10-CM | POA: Diagnosis not present

## 2018-10-07 DIAGNOSIS — E119 Type 2 diabetes mellitus without complications: Secondary | ICD-10-CM

## 2018-10-07 DIAGNOSIS — D472 Monoclonal gammopathy: Secondary | ICD-10-CM | POA: Diagnosis not present

## 2018-10-07 DIAGNOSIS — E7800 Pure hypercholesterolemia, unspecified: Secondary | ICD-10-CM | POA: Diagnosis not present

## 2018-10-07 DIAGNOSIS — N189 Chronic kidney disease, unspecified: Secondary | ICD-10-CM | POA: Diagnosis not present

## 2018-10-07 DIAGNOSIS — E1122 Type 2 diabetes mellitus with diabetic chronic kidney disease: Secondary | ICD-10-CM | POA: Diagnosis not present

## 2018-10-07 DIAGNOSIS — C61 Malignant neoplasm of prostate: Secondary | ICD-10-CM

## 2018-10-07 NOTE — Telephone Encounter (Signed)
Printed calendar and avs. °

## 2018-10-07 NOTE — Patient Instructions (Signed)
Thank you for choosing [facility] to provide your oncology and hematology care.  To afford each patient quality time with our providers, please arrive 30 minutes before your scheduled appointment time.  If you arrive late for your appointment, you may be asked to reschedule.  We strive to give you quality time with our providers, and arriving late affects you and other patients whose appointments are after yours.    If you are a no show for multiple scheduled visits, you may be dismissed from the clinic at the providers discretion.     Again, thank you for choosing [facility], our hope is that these requests will decrease the amount of time that you wait before being seen by our physicians.  ______________________________________________________________________   Should you have questions after your visit to the [facility], please contact our office at [phone] between the hours of 8:30 and 4:30 p.m.    Voicemails left after 4:30p.m will not be returned until the following business day.     For prescription refill requests, please have your pharmacy contact us directly.  Please also try to allow 48 hours for prescription requests.     Please contact the scheduling department for questions regarding scheduling.  For scheduling of procedures such as PET scans, CT scans, MRI, Ultrasound, etc please contact central scheduling at [phone].     Resources For Cancer Patients and Caregivers:    Oncolink.org:  A wonderful resource for patients and healthcare providers for information regarding your disease, ways to tract your treatment, what to expect, etc.      American Cancer Society:  800-227-2345  Can help patients locate various types of support and financial assistance   Cancer Care: 1-800-813-HOPE (4673) Provides financial assistance, online support groups, medication/co-pay assistance.     [place] DSS:  336-641-3447 Where to apply  for food stamps, Medicaid, and utility assistance   Medicare Rights Center: 800-333-4114 Helps people with Medicare understand their rights and benefits, navigate the Medicare system, and secure the quality healthcare they deserve   SCAT: 336-333-6589 [place] [employer]'s shared-ride transportation service for eligible riders who have a disability that prevents them from riding the fixed route bus.     For additional information on assistance programs please contact our social worker:   Justin [name]:  [phone]  

## 2018-10-12 ENCOUNTER — Encounter (HOSPITAL_COMMUNITY): Payer: Self-pay | Admitting: Emergency Medicine

## 2018-10-12 ENCOUNTER — Observation Stay (HOSPITAL_COMMUNITY)
Admission: EM | Admit: 2018-10-12 | Discharge: 2018-10-15 | Disposition: A | Discharge status: Home / Self Care | Payer: Medicare PPO | Attending: Internal Medicine | Admitting: Internal Medicine

## 2018-10-12 ENCOUNTER — Other Ambulatory Visit: Payer: Self-pay

## 2018-10-12 ENCOUNTER — Emergency Department (HOSPITAL_COMMUNITY): Payer: Medicare PPO

## 2018-10-12 DIAGNOSIS — Z9049 Acquired absence of other specified parts of digestive tract: Secondary | ICD-10-CM | POA: Insufficient documentation

## 2018-10-12 DIAGNOSIS — N183 Chronic kidney disease, stage 3 (moderate): Secondary | ICD-10-CM | POA: Insufficient documentation

## 2018-10-12 DIAGNOSIS — R1084 Generalized abdominal pain: Secondary | ICD-10-CM

## 2018-10-12 DIAGNOSIS — I129 Hypertensive chronic kidney disease with stage 1 through stage 4 chronic kidney disease, or unspecified chronic kidney disease: Secondary | ICD-10-CM | POA: Diagnosis not present

## 2018-10-12 DIAGNOSIS — R2681 Unsteadiness on feet: Secondary | ICD-10-CM | POA: Insufficient documentation

## 2018-10-12 DIAGNOSIS — F419 Anxiety disorder, unspecified: Secondary | ICD-10-CM | POA: Insufficient documentation

## 2018-10-12 DIAGNOSIS — K449 Diaphragmatic hernia without obstruction or gangrene: Secondary | ICD-10-CM | POA: Insufficient documentation

## 2018-10-12 DIAGNOSIS — Z87891 Personal history of nicotine dependence: Secondary | ICD-10-CM | POA: Insufficient documentation

## 2018-10-12 DIAGNOSIS — E7800 Pure hypercholesterolemia, unspecified: Secondary | ICD-10-CM | POA: Diagnosis not present

## 2018-10-12 DIAGNOSIS — R748 Abnormal levels of other serum enzymes: Secondary | ICD-10-CM

## 2018-10-12 DIAGNOSIS — M6281 Muscle weakness (generalized): Secondary | ICD-10-CM | POA: Diagnosis not present

## 2018-10-12 DIAGNOSIS — R945 Abnormal results of liver function studies: Secondary | ICD-10-CM | POA: Diagnosis not present

## 2018-10-12 DIAGNOSIS — K219 Gastro-esophageal reflux disease without esophagitis: Secondary | ICD-10-CM | POA: Diagnosis not present

## 2018-10-12 DIAGNOSIS — Z833 Family history of diabetes mellitus: Secondary | ICD-10-CM | POA: Diagnosis not present

## 2018-10-12 DIAGNOSIS — T182XXA Foreign body in stomach, initial encounter: Secondary | ICD-10-CM | POA: Diagnosis present

## 2018-10-12 DIAGNOSIS — Z794 Long term (current) use of insulin: Secondary | ICD-10-CM | POA: Insufficient documentation

## 2018-10-12 DIAGNOSIS — E1122 Type 2 diabetes mellitus with diabetic chronic kidney disease: Secondary | ICD-10-CM | POA: Diagnosis not present

## 2018-10-12 DIAGNOSIS — D472 Monoclonal gammopathy: Secondary | ICD-10-CM | POA: Diagnosis not present

## 2018-10-12 DIAGNOSIS — D631 Anemia in chronic kidney disease: Secondary | ICD-10-CM | POA: Diagnosis not present

## 2018-10-12 DIAGNOSIS — K5790 Diverticulosis of intestine, part unspecified, without perforation or abscess without bleeding: Secondary | ICD-10-CM | POA: Insufficient documentation

## 2018-10-12 DIAGNOSIS — K921 Melena: Secondary | ICD-10-CM | POA: Diagnosis not present

## 2018-10-12 DIAGNOSIS — Z886 Allergy status to analgesic agent status: Secondary | ICD-10-CM | POA: Insufficient documentation

## 2018-10-12 DIAGNOSIS — Z951 Presence of aortocoronary bypass graft: Secondary | ICD-10-CM | POA: Insufficient documentation

## 2018-10-12 DIAGNOSIS — K5730 Diverticulosis of large intestine without perforation or abscess without bleeding: Secondary | ICD-10-CM | POA: Diagnosis not present

## 2018-10-12 DIAGNOSIS — Z7982 Long term (current) use of aspirin: Secondary | ICD-10-CM | POA: Insufficient documentation

## 2018-10-12 DIAGNOSIS — Z86010 Personal history of colonic polyps: Secondary | ICD-10-CM | POA: Diagnosis not present

## 2018-10-12 DIAGNOSIS — R109 Unspecified abdominal pain: Secondary | ICD-10-CM | POA: Diagnosis present

## 2018-10-12 DIAGNOSIS — I10 Essential (primary) hypertension: Secondary | ICD-10-CM | POA: Diagnosis present

## 2018-10-12 DIAGNOSIS — I2510 Atherosclerotic heart disease of native coronary artery without angina pectoris: Secondary | ICD-10-CM | POA: Diagnosis not present

## 2018-10-12 DIAGNOSIS — Z79899 Other long term (current) drug therapy: Secondary | ICD-10-CM | POA: Insufficient documentation

## 2018-10-12 DIAGNOSIS — R7989 Other specified abnormal findings of blood chemistry: Secondary | ICD-10-CM | POA: Diagnosis present

## 2018-10-12 DIAGNOSIS — R1033 Periumbilical pain: Secondary | ICD-10-CM | POA: Insufficient documentation

## 2018-10-12 DIAGNOSIS — R1011 Right upper quadrant pain: Principal | ICD-10-CM | POA: Insufficient documentation

## 2018-10-12 DIAGNOSIS — E119 Type 2 diabetes mellitus without complications: Secondary | ICD-10-CM | POA: Insufficient documentation

## 2018-10-12 LAB — COMPREHENSIVE METABOLIC PANEL
ALT: 313 U/L — ABNORMAL HIGH (ref 0–44)
AST: 662 U/L — ABNORMAL HIGH (ref 15–41)
Albumin: 3.4 g/dL — ABNORMAL LOW (ref 3.5–5.0)
Alkaline Phosphatase: 213 U/L — ABNORMAL HIGH (ref 38–126)
Anion gap: 11 (ref 5–15)
BUN: 26 mg/dL — ABNORMAL HIGH (ref 8–23)
CO2: 22 mmol/L (ref 22–32)
Calcium: 9.6 mg/dL (ref 8.9–10.3)
Chloride: 109 mmol/L (ref 98–111)
Creatinine, Ser: 1.62 mg/dL — ABNORMAL HIGH (ref 0.61–1.24)
GFR calc Af Amer: 45 mL/min — ABNORMAL LOW (ref >60)
GFR calc non Af Amer: 39 mL/min — ABNORMAL LOW (ref >60)
Glucose, Bld: 205 mg/dL — ABNORMAL HIGH (ref 70–99)
Potassium: 3.8 mmol/L (ref 3.5–5.1)
Sodium: 142 mmol/L (ref 135–145)
Total Bilirubin: 1.8 mg/dL — ABNORMAL HIGH (ref 0.3–1.2)
Total Protein: 7.5 g/dL (ref 6.5–8.1)

## 2018-10-12 LAB — TYPE AND SCREEN
ABO/RH(D): B POS
Antibody Screen: NEGATIVE

## 2018-10-12 LAB — CBC
HCT: 40.6 % (ref 39.0–52.0)
Hemoglobin: 12.8 g/dL — ABNORMAL LOW (ref 13.0–17.0)
MCH: 27 pg (ref 26.0–34.0)
MCHC: 31.5 g/dL (ref 30.0–36.0)
MCV: 85.7 fL (ref 80.0–100.0)
Platelets: 182 10*3/uL (ref 150–400)
RBC: 4.74 MIL/uL (ref 4.22–5.81)
RDW: 15.4 % (ref 11.5–15.5)
WBC: 13.3 10*3/uL — ABNORMAL HIGH (ref 4.0–10.5)
nRBC: 0 % (ref 0.0–0.2)

## 2018-10-12 LAB — LIPASE, BLOOD: Lipase: 65 U/L — ABNORMAL HIGH (ref 11–51)

## 2018-10-12 LAB — POC OCCULT BLOOD, ED: Fecal Occult Bld: NEGATIVE

## 2018-10-12 LAB — ABO/RH: ABO/RH(D): B POS

## 2018-10-12 MED ORDER — METOPROLOL TARTRATE 50 MG PO TABS
50.0000 mg | ORAL_TABLET | Freq: Two times a day (BID) | ORAL | Status: DC
  Administered 2018-10-13 – 2018-10-15 (×6): 50 mg via ORAL
  Filled 2018-10-12 (×6): qty 1

## 2018-10-12 MED ORDER — ASPIRIN EC 81 MG PO TBEC
81.0000 mg | DELAYED_RELEASE_TABLET | Freq: Every day | ORAL | Status: DC
  Administered 2018-10-13 – 2018-10-15 (×3): 81 mg via ORAL
  Filled 2018-10-12 (×3): qty 1

## 2018-10-12 MED ORDER — ONDANSETRON HCL 4 MG PO TABS: 4.0000 mg | ORAL_TABLET | Freq: Four times a day (QID) | ORAL | Status: DC | PRN

## 2018-10-12 MED ORDER — IOHEXOL 300 MG/ML  SOLN
100.0000 mL | Freq: Once | INTRAMUSCULAR | Status: AC | PRN
  Administered 2018-10-12: 100 mL via INTRAVENOUS

## 2018-10-12 MED ORDER — CHLORTHALIDONE 25 MG PO TABS
25.0000 mg | ORAL_TABLET | Freq: Every day | ORAL | Status: DC
  Administered 2018-10-13: 25 mg via ORAL
  Filled 2018-10-12: qty 1

## 2018-10-12 MED ORDER — ONDANSETRON HCL 4 MG/2ML IJ SOLN
4.0000 mg | Freq: Four times a day (QID) | INTRAMUSCULAR | Status: DC | PRN
  Administered 2018-10-14: 4 mg via INTRAVENOUS
  Filled 2018-10-12 (×2): qty 2

## 2018-10-12 MED ORDER — SODIUM CHLORIDE 0.9 % IV SOLN
INTRAVENOUS | Status: DC
  Administered 2018-10-13 (×2): via INTRAVENOUS

## 2018-10-12 MED ORDER — ISOSORBIDE MONONITRATE ER 60 MG PO TB24
60.0000 mg | ORAL_TABLET | Freq: Every day | ORAL | Status: DC
  Administered 2018-10-13 – 2018-10-15 (×3): 60 mg via ORAL
  Filled 2018-10-12 (×3): qty 1

## 2018-10-12 NOTE — H&P (Signed)
History and Physical    Justin [name] TMH:[id number] DOB: July 01, 1933 DOA: 10/12/2018  PCP: Justin Mccreedy, MD  Patient coming from: home  Chief Complaint:  Abdominal pain  HPI: Justin [name] is a 85 y.o. male with medical history significant of chronic abdominal pain being worked up by dr hung as outpt with recent egd and colonscopy several polyps removed and also something removed in stomach that was clipped (do not have records available ) comes in with worsening abdominal pain that is periumbilical across mid abd worse with eating.  With nausea and vomiting no diarrhea.  No fevers.  No melena or brbpr but stool has been darker than usual.  Pt  Has had normal liver function tests in the past and tonight he has elevated of his lfts, alk phos and tbili which is changed from the last month during his outpt work up.  Dr hung called and advised mrcp.  Review of Systems: As per HPI otherwise 10 point review of systems negative.   Past Medical History:  Diagnosis Date  . Anxiety   . Arthritis   . Coronary artery disease   . Diabetes mellitus without complication ([place])   . Diverticulitis   . Dyspnea   . GERD (gastroesophageal reflux disease)   . Hypercholesteremia   . Hypertension   . Pneumonia    as a child  . Prostate cancer (Justin [name])   . UTI (lower urinary tract infection)     Past Surgical History:  Procedure Laterality Date  . ABDOMINAL SURGERY     for diverticulitis; also removed appendix  . CATARACT EXTRACTION    . CATARACT EXTRACTION, BILATERAL    . CHOLECYSTECTOMY N/A 10/12/2017   Procedure: LAPAROSCOPIC CHOLECYSTECTOMY;  Surgeon: Justin Keens, MD;  Location: [facility];  Service: General;  Laterality: N/A;  . CORONARY ARTERY BYPASS GRAFT  2009  . EYE SURGERY     "for bleeding in eye"  . HERNIA REPAIR    . LEFT HEART CATH AND CORONARY ANGIOGRAPHY N/A 11/04/2016   Procedure: Left Heart Cath and Coronary Angiography;  Surgeon: Justin Prows, MD;  Location: [facility];   Service: Cardiovascular;  Laterality: N/A;  . LOWER EXTREMITY ANGIOGRAPHY N/A 11/04/2016   Procedure: Lower Extremity Angiography;  Surgeon: Justin Prows, MD;  Location: [facility];  Service: Cardiovascular;  Laterality: N/A;  . PROSTATE SURGERY       reports that he has quit smoking. He has never used smokeless tobacco. He reports that he does not drink alcohol or use drugs.  Allergies  Allergen Reactions  . Dye Fdc Red [Red Dye] Swelling and Other (See Comments)    CAT scan dye  . Ibuprofen Other (See Comments)    Upset stomach     Family History  Problem Relation Age of Onset  . Diabetes Father   . Diabetes Maternal Aunt   . Diabetes Maternal Uncle   . Diabetes Maternal Grandmother     Prior to Admission medications   Medication Sig Start Date End Date Taking? Authorizing Provider  AMITIZA 24 MCG capsule Take 24 mcg by mouth daily as needed for constipation.  10/24/16  Yes [provider]  aspirin EC 81 MG tablet Take 81 mg by mouth daily.   Yes [provider]  chlorthalidone (HYGROTON) 25 MG tablet Take 25 mg by mouth daily.    Yes [provider]  clonazePAM (KLONOPIN) 0.5 MG tablet Take 0.5 mg by mouth daily as needed for anxiety.   Yes [provider]  isosorbide mononitrate (IMDUR) 60 MG 24 hr tablet Take 60 mg by mouth daily. 09/17/16  Yes [provider]  JANUVIA 100 MG tablet Take 100 mg by mouth daily.  10/24/16  Yes [provider]  LANTUS SOLOSTAR 100 UNIT/ML Solostar Pen Inject 37 Units into the skin 2 (two) times daily.  09/10/16  Yes [provider]  metoprolol (LOPRESSOR) 50 MG tablet Take 50 mg by mouth 2 (two) times daily.   Yes [provider]  Na Sulfate-K Sulfate-Mg Sulf (SUPREP BOWEL PREP KIT) 17.5-3.13-1.6 GM/177ML SOLN Take 1 kit by mouth once.   Yes [provider]  nitroGLYCERIN (NITROSTAT) 0.4 MG SL tablet Place 0.4 mg under the tongue every 5 (five) minutes as needed for  chest pain.  10/13/16  Yes [provider]  Pyridoxine HCl (VITAMIN B-6 PO) Take 1 tablet by mouth daily.   Yes [provider]  rosuvastatin (CRESTOR) 20 MG tablet Take 20 mg by mouth daily.  06/24/18  Yes [provider]  sucralfate (CARAFATE) 1 g tablet Take 1 g by mouth 4 (four) times daily.   Yes [provider]  tamsulosin (FLOMAX) 0.4 MG CAPS capsule Take 0.4 mg by mouth daily.   Yes [provider]  DULoxetine HCl 40 MG CSDR Take 1 capsule by mouth 2 (two) times daily.    [provider]  predniSONE (DELTASONE) 20 MG tablet Take 2 tablets daily with breakfast. Patient not taking: Reported on 10/12/2018 01/27/18   Justin Eagles, PA-C    Physical Exam: Vitals:   10/12/18 1700 10/12/18 1931 10/12/18 1945 10/12/18 2000  BP: (!) 149/59 (!) 142/64 (!) 141/61 (!) 134/56  Pulse: 66 74 77 74  Resp: (!) 23 (!) 29 (!) 22 (!) 35  Temp:      TempSrc:      SpO2: 99% 99% 98% 97%      Constitutional: NAD, calm, comfortable Vitals:   10/12/18 1700 10/12/18 1931 10/12/18 1945 10/12/18 2000  BP: (!) 149/59 (!) 142/64 (!) 141/61 (!) 134/56  Pulse: 66 74 77 74  Resp: (!) 23 (!) 29 (!) 22 (!) 35  Temp:      TempSrc:      SpO2: 99% 99% 98% 97%   Eyes: PERRL, lids and conjunctivae normal ENMT: Mucous membranes are moist. Posterior pharynx clear of any exudate or lesions.Normal dentition.  Neck: normal, supple, no masses, no thyromegaly Respiratory: clear to auscultation bilaterally, no wheezing, no crackles. Normal respiratory effort. No accessory muscle use.  Cardiovascular: Regular rate and rhythm, no murmurs / rubs / gallops. No extremity edema. 2+ pedal pulses. No carotid bruits.  Abdomen: no tenderness, no masses palpated. No hepatosplenomegaly. Bowel sounds positive.  Musculoskeletal: no clubbing / cyanosis. No joint deformity upper and lower extremities. Good ROM, no contractures. Normal muscle tone.  Skin: no rashes, lesions, ulcers. No  induration Neurologic: CN 2-12 grossly intact. Sensation intact, DTR normal. Strength 5/5 in all 4.  Psychiatric: Normal judgment and insight. Alert and oriented x 3. Normal mood.    Labs on Admission: I have personally reviewed following labs and imaging studies  CBC: Recent Labs  Lab 10/12/18 1513  WBC 13.3*  HGB 12.8*  HCT 40.6  MCV 85.7  PLT 250   Basic Metabolic Panel: Recent Labs  Lab 10/12/18 1513  NA 142  K 3.8  CL 109  CO2 22  GLUCOSE 205*  BUN 26*  CREATININE 1.62*  CALCIUM 9.6   GFR: Estimated Creatinine Clearance:  35.9 mL/min (A) (by C-G formula based on SCr of 1.62 mg/dL (H)). Liver Function Tests: Recent Labs  Lab 10/12/18 1513  AST 662*  ALT 313*  ALKPHOS 213*  BILITOT 1.8*  PROT 7.5  ALBUMIN 3.4*   Recent Labs  Lab 10/12/18 1636  LIPASE 65*   No results for input(s): AMMONIA in the last 168 hours. Coagulation Profile: No results for input(s): INR, PROTIME in the last 168 hours. Cardiac Enzymes: No results for input(s): CKTOTAL, CKMB, CKMBINDEX, TROPONINI in the last 168 hours. BNP (last 3 results) No results for input(s): PROBNP in the last 8760 hours. HbA1C: No results for input(s): HGBA1C in the last 72 hours. CBG: No results for input(s): GLUCAP in the last 168 hours. Lipid Profile: No results for input(s): CHOL, HDL, LDLCALC, TRIG, CHOLHDL, LDLDIRECT in the last 72 hours. Thyroid Function Tests: No results for input(s): TSH, T4TOTAL, FREET4, T3FREE, THYROIDAB in the last 72 hours. Anemia Panel: No results for input(s): VITAMINB12, FOLATE, FERRITIN, TIBC, IRON, RETICCTPCT in the last 72 hours. Urine analysis:    Component Value Date/Time   COLORURINE YELLOW 03/12/2016 2211   APPEARANCEUR HAZY (A) 03/12/2016 2211   LABSPEC 1.006 03/12/2016 2211   PHURINE 6.0 03/12/2016 2211   GLUCOSEU NEGATIVE 03/12/2016 2211   HGBUR SMALL (A) 03/12/2016 2211   BILIRUBINUR NEGATIVE 03/12/2016 2211   KETONESUR NEGATIVE 03/12/2016 2211    PROTEINUR NEGATIVE 03/12/2016 2211   NITRITE NEGATIVE 03/12/2016 2211   LEUKOCYTESUR LARGE (A) 03/12/2016 2211   Sepsis Labs: !!!!!!!!!!!!!!!!!!!!!!!!!!!!!!!!!!!!!!!!!!!! _0 (procalcitonin:4,lacticidven:4) )No results found for this or any previous visit (from the past 240 hour(s)).   Radiological Exams on Admission: Ct Abdomen Pelvis W Contrast  Result Date: 10/12/2018 CLINICAL DATA:  Abdominal pain with dark stools EXAM: CT ABDOMEN AND PELVIS WITH CONTRAST TECHNIQUE: Multidetector CT imaging of the abdomen and pelvis was performed using the standard protocol following bolus administration of intravenous contrast. CONTRAST:  176m OMNIPAQUE 300 COMPARISON:  None. FINDINGS: Lower chest: No acute abnormality. Hepatobiliary: No focal liver abnormality is seen. Status post cholecystectomy. No biliary dilatation. Pancreas: Unremarkable. No pancreatic ductal dilatation or surrounding inflammatory changes. Spleen: Normal in size without focal abnormality. Adrenals/Urinary Tract: Adrenal glands are unremarkable. Kidneys are normal, without renal calculi, focal lesion, or hydronephrosis. Bladder is unremarkable. Stomach/Bowel: Postsurgical changes are noted within the sigmoid colon. Diverticular change is noted without evidence of diverticulitis. The appendix is not well visualized. No inflammatory changes to suggest appendicitis are noted. Sliding-type hiatal hernia is noted. Stomach is filled with fluid without definitive obstructive change. Small metallic foreign body is noted within the stomach of uncertain chronicity. No small bowel obstructive changes noted. Vascular/Lymphatic: Aortic atherosclerosis. No enlarged abdominal or pelvic lymph nodes. Reproductive: Status post prostatectomy. Other: No abdominal wall hernia or abnormality. No abdominopelvic ascites. Musculoskeletal: Degenerative changes of the lumbar spine are noted. IMPRESSION: Fluid-filled stomach with evidence of sliding-type hiatal  hernia. Small metallic foreign body is noted within the midportion of the gastric lumen of uncertain chronicity. Correlate with any recent ingestion or procedure. Diverticulosis without diverticulitis. No acute abnormality is noted. Electronically Signed   By: MInez CatalinaM.D.   On: 10/12/2018 17:56   UKoreaAbdomen Limited  Result Date: 10/12/2018 CLINICAL DATA:  Upper abdominal pain EXAM: ULTRASOUND ABDOMEN LIMITED RIGHT UPPER QUADRANT COMPARISON:  CT abdomen and pelvis 10/12/2018 FINDINGS: Gallbladder: Surgically absent Common bile duct: Diameter: 9 mm diameter, may be normal for patient of this age post cholecystectomy. Liver: Normal parenchymal echogenicity without mass or nodularity. No  intrahepatic biliary dilatation. Portal vein is patent on color Doppler imaging with normal direction of blood flow towards the liver. No RIGHT upper quadrant free fluid. IMPRESSION: Post cholecystectomy. 9 mm CBD which may be normal for a patient of this age post cholecystectomy, recommend correlation with LFTs. No focal hepatic sonographic abnormalities. Electronically Signed   By: Lavonia Dana M.D.   On: 10/12/2018 19:31   Old chart reviewed Case discussed with mr lawyer edp    Assessment/Plan 85 yo male with abdominal pain now with abnormal liver function tests  Principal Problem:   Abdominal pain- ivf.  Gi consulted.  mrcp ordered.  abd exam is benign.  obs on medical bed.  No overt bleeding.  Heme stool is pending final result.   Active Problems:   Abnormal liver function tests-as above, mrcp    Hypertension- resume home meds    GERD (gastroesophageal reflux disease)-noted    Coronary artery disease- stable    Foreign body in stomach- pt reports he had something clipped with recent EGD, await GI consult for confirmation   DVT prophylaxis: scds Code Status: full Family Communication: dtr Disposition Plan:  Tomorrow  Consults called: dr hung with GI Admission status:   observation   Jayston Trevino A MD [facility]  If 7PM-7AM, please contact night-coverage [email or web address] Password [email or web address]  10/12/2018, 10:14 PM

## 2018-10-12 NOTE — ED Triage Notes (Signed)
Pt reporting abdominal pain with 2 episodes of dark stools with clots along with emesis today. Denies chest pain or blood thinner use. A/o at triage.

## 2018-10-12 NOTE — ED Notes (Signed)
Pt refusing MRI, states medication to calm nerves will not make a difference.  He had an MRI years ago and never wants another one.

## 2018-10-12 NOTE — ED Provider Notes (Signed)
[facility] EMERGENCY DEPARTMENT Provider Note   CSN: [id number] Arrival date & time: 10/12/18  1445     History   Chief Complaint Chief Complaint  Patient presents with  . Abdominal Pain  . GI Bleeding  . Emesis    HPI Justin [name] is a 85 y.o. male.  HPI Patient presents to the emergency department with right-sided abdominal discomfort that started 2 days ago.  The patient states that he was seen by his GI doctor last week for evaluation of possible GI bleeding.  The patient states he had an endoscopy and colonoscopy which did not show any significant abnormalities other than a few polyps.  Patient states that nothing seems make condition better or worse.  The patient states that did not take any medications prior to arrival for symptoms.  The patient denies chest pain, shortness of breath, headache,blurred vision, neck pain, fever, cough, weakness, numbness, dizziness, anorexia, edema,  vomiting, diarrhea, rash, back pain, dysuria, hematemesis, bloody stool, near syncope, or syncope. Past Medical History:  Diagnosis Date  . Anxiety   . Arthritis   . Coronary artery disease   . Diabetes mellitus without complication ([place])   . Diverticulitis   . Dyspnea   . GERD (gastroesophageal reflux disease)   . Hypercholesteremia   . Hypertension   . Pneumonia    as a child  . Prostate cancer ([place])   . UTI (lower urinary tract infection)     Patient Active Problem List   Diagnosis Date Noted  . Abdominal pain 10/12/2018  . Abnormal liver function tests 10/12/2018  . Foreign body in stomach 10/12/2018  . Hypertension   . GERD (gastroesophageal reflux disease)   . Diabetes mellitus without complication ([place])   . Coronary artery disease   . Angina pectoris ([place]) 11/03/2016    Past Surgical History:  Procedure Laterality Date  . ABDOMINAL SURGERY     for diverticulitis; also removed appendix  . CATARACT EXTRACTION    . CATARACT EXTRACTION, BILATERAL    .  CHOLECYSTECTOMY N/A 10/12/2017   Procedure: LAPAROSCOPIC CHOLECYSTECTOMY;  Surgeon: [name], MD;  Location: [facility];  Service: General;  Laterality: N/A;  . CORONARY ARTERY BYPASS GRAFT  2009  . EYE SURGERY     "for bleeding in eye"  . HERNIA REPAIR    . LEFT HEART CATH AND CORONARY ANGIOGRAPHY N/A 11/04/2016   Procedure: Left Heart Cath and Coronary Angiography;  Surgeon: [name], MD;  Location: [facility];  Service: Cardiovascular;  Laterality: N/A;  . LOWER EXTREMITY ANGIOGRAPHY N/A 11/04/2016   Procedure: Lower Extremity Angiography;  Surgeon: [name], MD;  Location: [facility];  Service: Cardiovascular;  Laterality: N/A;  . PROSTATE SURGERY          Home Medications    Prior to Admission medications   Medication Sig Start Date End Date Taking? Authorizing Provider  AMITIZA 24 MCG capsule Take 24 mcg by mouth daily as needed for constipation.  10/24/16  Yes [provider]  aspirin EC 81 MG tablet Take 81 mg by mouth daily.   Yes [provider]  chlorthalidone (HYGROTON) 25 MG tablet Take 25 mg by mouth daily.    Yes [provider]  clonazePAM (KLONOPIN) 0.5 MG tablet Take 0.5 mg by mouth daily as needed for anxiety.   Yes [provider]  isosorbide mononitrate (IMDUR) 60 MG 24 hr tablet Take 60 mg by mouth daily. 09/17/16  Yes [provider]  JANUVIA 100 MG tablet Take 100 mg by mouth daily.  10/24/16  Yes [provider]  LANTUS SOLOSTAR 100 UNIT/ML Solostar Pen Inject 37 Units into the skin 2 (two) times daily.  09/10/16  Yes [provider]  metoprolol (LOPRESSOR) 50 MG tablet Take 50 mg by mouth 2 (two) times daily.   Yes [provider]  Na Sulfate-K Sulfate-Mg Sulf (SUPREP BOWEL PREP KIT) 17.5-3.13-1.6 GM/177ML SOLN Take 1 kit by mouth once.   Yes [provider]  nitroGLYCERIN (NITROSTAT) 0.4 MG SL tablet Place 0.4 mg under the tongue every 5 (five) minutes as needed for  chest pain.  10/13/16  Yes [provider]  Pyridoxine HCl (VITAMIN B-6 PO) Take 1 tablet by mouth daily.   Yes [provider]  rosuvastatin (CRESTOR) 20 MG tablet Take 20 mg by mouth daily.  06/24/18  Yes [provider]  sucralfate (CARAFATE) 1 g tablet Take 1 g by mouth 4 (four) times daily.   Yes [provider]  tamsulosin (FLOMAX) 0.4 MG CAPS capsule Take 0.4 mg by mouth daily.   Yes [provider]  DULoxetine HCl 40 MG CSDR Take 1 capsule by mouth 2 (two) times daily.    [provider]  predniSONE (DELTASONE) 20 MG tablet Take 2 tablets daily with breakfast. Patient not taking: Reported on 10/12/2018 01/27/18   Jaynee Eagles, PA-C    Family History Family History  Problem Relation Age of Onset  . Diabetes Father   . Diabetes Maternal Aunt   . Diabetes Maternal Uncle   . Diabetes Maternal Grandmother     Social History Social History   Tobacco Use  . Smoking status: Former Research scientist (life sciences)  . Smokeless tobacco: Never Used  Substance Use Topics  . Alcohol use: No  . Drug use: No     Allergies   Dye fdc red [red dye] and Ibuprofen   Review of Systems Review of Systems All other systems negative except as documented in the HPI. All pertinent positives and negatives as reviewed in the HPI.  Physical Exam Updated Vital Signs BP (!) 134/56   Pulse 74   Temp 97.8 F (36.6 C) (Oral)   Resp (!) 35   SpO2 97%   Physical Exam Vitals signs and nursing note reviewed.  Constitutional:      General: He is not in acute distress.    Appearance: He is well-developed.  HENT:     Head: Normocephalic and atraumatic.  Eyes:     Pupils: Pupils are equal, round, and reactive to light.  Neck:     Musculoskeletal: Normal range of motion and neck supple.  Cardiovascular:     Rate and Rhythm: Normal rate and regular rhythm.     Heart sounds: Normal heart sounds. No murmur. No friction rub. No gallop.   Pulmonary:     Effort: Pulmonary  effort is normal. No respiratory distress.     Breath sounds: Normal breath sounds. No wheezing.  Abdominal:     General: Bowel sounds are normal. There is no distension.     Palpations: Abdomen is soft.     Tenderness: There is abdominal tenderness in the right upper quadrant, epigastric area and periumbilical area.  Skin:    General: Skin is warm and dry.     Capillary Refill: Capillary refill takes less than 2 seconds.     Findings: No erythema or rash.  Neurological:     Mental Status: He is alert and oriented to person,  place, and time.     Motor: No abnormal muscle tone.     Coordination: Coordination normal.  Psychiatric:        Behavior: Behavior normal.      ED Treatments / Results  Labs (all labs ordered are listed, but only abnormal results are displayed) Labs Reviewed  COMPREHENSIVE METABOLIC PANEL - Abnormal; Notable for the following components:      Result Value   Glucose, Bld 205 (*)    BUN 26 (*)    Creatinine, Ser 1.62 (*)    Albumin 3.4 (*)    AST 662 (*)    ALT 313 (*)    Alkaline Phosphatase 213 (*)    Total Bilirubin 1.8 (*)    GFR calc non Af Amer 39 (*)    GFR calc Af Amer 45 (*)    All other components within normal limits  CBC - Abnormal; Notable for the following components:   WBC 13.3 (*)    Hemoglobin 12.8 (*)    All other components within normal limits  LIPASE, BLOOD - Abnormal; Notable for the following components:   Lipase 65 (*)    All other components within normal limits  HEPATITIS PANEL, ACUTE  COMPREHENSIVE METABOLIC PANEL  CBC  LIPASE, BLOOD  POC OCCULT BLOOD, ED  TYPE AND SCREEN  ABO/RH    EKG None  Radiology Ct Abdomen Pelvis W Contrast  Result Date: 10/12/2018 CLINICAL DATA:  Abdominal pain with dark stools EXAM: CT ABDOMEN AND PELVIS WITH CONTRAST TECHNIQUE: Multidetector CT imaging of the abdomen and pelvis was performed using the standard protocol following bolus administration of intravenous contrast. CONTRAST:   158m OMNIPAQUE 300 COMPARISON:  None. FINDINGS: Lower chest: No acute abnormality. Hepatobiliary: No focal liver abnormality is seen. Status post cholecystectomy. No biliary dilatation. Pancreas: Unremarkable. No pancreatic ductal dilatation or surrounding inflammatory changes. Spleen: Normal in size without focal abnormality. Adrenals/Urinary Tract: Adrenal glands are unremarkable. Kidneys are normal, without renal calculi, focal lesion, or hydronephrosis. Bladder is unremarkable. Stomach/Bowel: Postsurgical changes are noted within the sigmoid colon. Diverticular change is noted without evidence of diverticulitis. The appendix is not well visualized. No inflammatory changes to suggest appendicitis are noted. Sliding-type hiatal hernia is noted. Stomach is filled with fluid without definitive obstructive change. Small metallic foreign body is noted within the stomach of uncertain chronicity. No small bowel obstructive changes noted. Vascular/Lymphatic: Aortic atherosclerosis. No enlarged abdominal or pelvic lymph nodes. Reproductive: Status post prostatectomy. Other: No abdominal wall hernia or abnormality. No abdominopelvic ascites. Musculoskeletal: Degenerative changes of the lumbar spine are noted. IMPRESSION: Fluid-filled stomach with evidence of sliding-type hiatal hernia. Small metallic foreign body is noted within the midportion of the gastric lumen of uncertain chronicity. Correlate with any recent ingestion or procedure. Diverticulosis without diverticulitis. No acute abnormality is noted. Electronically Signed   By: MInez CatalinaM.D.   On: 10/12/2018 17:56   [place]oreaAbdomen Limited  Result Date: 10/12/2018 CLINICAL DATA:  Upper abdominal pain EXAM: ULTRASOUND ABDOMEN LIMITED RIGHT UPPER QUADRANT COMPARISON:  CT abdomen and pelvis 10/12/2018 FINDINGS: Gallbladder: Surgically absent Common bile duct: Diameter: 9 mm diameter, may be normal for patient of this age post cholecystectomy. Liver: Normal  parenchymal echogenicity without mass or nodularity. No intrahepatic biliary dilatation. Portal vein is patent on color Doppler imaging with normal direction of blood flow towards the liver. No RIGHT upper quadrant free fluid. IMPRESSION: Post cholecystectomy. 9 mm CBD which may be normal for a patient of this age post cholecystectomy, recommend  correlation with LFTs. No focal hepatic sonographic abnormalities. Electronically Signed   By: Lavonia Dana M.D.   On: 10/12/2018 19:31    Procedures Procedures (including critical care time)  Medications Ordered in ED Medications  aspirin EC tablet 81 mg (has no administration in time range)  metoprolol tartrate (LOPRESSOR) tablet 50 mg (has no administration in time range)  isosorbide mononitrate (IMDUR) 24 hr tablet 60 mg (has no administration in time range)  chlorthalidone (HYGROTON) tablet 25 mg (has no administration in time range)  0.9 %  sodium chloride infusion (has no administration in time range)  ondansetron (ZOFRAN) tablet 4 mg (has no administration in time range)    Or  ondansetron (ZOFRAN) injection 4 mg (has no administration in time range)  iohexol (OMNIPAQUE) 300 MG/ML solution 100 mL (100 mLs Intravenous Contrast Given 10/12/18 1740)     Initial Impression / Assessment and Plan / ED Course  I have reviewed the triage vital signs and the nursing notes.  Pertinent labs & imaging results that were available during my care of the patient were reviewed by me and considered in my medical decision making (see chart for details).  Clinical Course as of Oct 12 2301  Tue Oct 12, 2018  1800 Anion gap: 11 [LJ]    Clinical Course User Index [LJ] Deboraha Sprang, Student-PA    I spoke with Dr. Benson Norway and he felt that the patient need to be admitted for further evaluation of his significant rise in his liver enzyme testing.  I did send off a hepatitis panel.  Also spoke with the [facility] will admit the patient to the  hospital. Final Clinical Impressions(s) / ED Diagnoses   Final diagnoses:  None    ED Discharge Orders    None       Dalia Heading, PA-C 10/12/18 2306    Veryl Speak, MD 10/12/18 2325

## 2018-10-13 ENCOUNTER — Other Ambulatory Visit: Payer: Self-pay

## 2018-10-13 ENCOUNTER — Encounter (HOSPITAL_COMMUNITY): Payer: Self-pay | Admitting: Radiology

## 2018-10-13 ENCOUNTER — Observation Stay (HOSPITAL_COMMUNITY): Payer: Medicare PPO

## 2018-10-13 DIAGNOSIS — R112 Nausea with vomiting, unspecified: Secondary | ICD-10-CM | POA: Diagnosis not present

## 2018-10-13 DIAGNOSIS — R1084 Generalized abdominal pain: Secondary | ICD-10-CM | POA: Diagnosis not present

## 2018-10-13 DIAGNOSIS — R7989 Other specified abnormal findings of blood chemistry: Secondary | ICD-10-CM | POA: Diagnosis not present

## 2018-10-13 DIAGNOSIS — E1169 Type 2 diabetes mellitus with other specified complication: Secondary | ICD-10-CM | POA: Diagnosis not present

## 2018-10-13 DIAGNOSIS — Z794 Long term (current) use of insulin: Secondary | ICD-10-CM

## 2018-10-13 DIAGNOSIS — R945 Abnormal results of liver function studies: Secondary | ICD-10-CM | POA: Diagnosis not present

## 2018-10-13 DIAGNOSIS — K625 Hemorrhage of anus and rectum: Secondary | ICD-10-CM | POA: Diagnosis not present

## 2018-10-13 DIAGNOSIS — E119 Type 2 diabetes mellitus without complications: Secondary | ICD-10-CM | POA: Diagnosis not present

## 2018-10-13 DIAGNOSIS — R1011 Right upper quadrant pain: Secondary | ICD-10-CM | POA: Diagnosis not present

## 2018-10-13 DIAGNOSIS — R748 Abnormal levels of other serum enzymes: Secondary | ICD-10-CM

## 2018-10-13 DIAGNOSIS — I10 Essential (primary) hypertension: Secondary | ICD-10-CM | POA: Diagnosis not present

## 2018-10-13 LAB — COMPREHENSIVE METABOLIC PANEL
ALT: 380 U/L — ABNORMAL HIGH (ref 0–44)
AST: 403 U/L — ABNORMAL HIGH (ref 15–41)
Albumin: 2.7 g/dL — ABNORMAL LOW (ref 3.5–5.0)
Alkaline Phosphatase: 211 U/L — ABNORMAL HIGH (ref 38–126)
Anion gap: 9 (ref 5–15)
BUN: 27 mg/dL — ABNORMAL HIGH (ref 8–23)
CO2: 19 mmol/L — ABNORMAL LOW (ref 22–32)
Calcium: 8.9 mg/dL (ref 8.9–10.3)
Chloride: 109 mmol/L (ref 98–111)
Creatinine, Ser: 1.7 mg/dL — ABNORMAL HIGH (ref 0.61–1.24)
GFR calc Af Amer: 42 mL/min — ABNORMAL LOW (ref >60)
GFR calc non Af Amer: 36 mL/min — ABNORMAL LOW (ref >60)
Glucose, Bld: 200 mg/dL — ABNORMAL HIGH (ref 70–99)
Potassium: 3.1 mmol/L — ABNORMAL LOW (ref 3.5–5.1)
Sodium: 137 mmol/L (ref 135–145)
Total Bilirubin: 4 mg/dL — ABNORMAL HIGH (ref 0.3–1.2)
Total Protein: 6.2 g/dL — ABNORMAL LOW (ref 6.5–8.1)

## 2018-10-13 LAB — CBC
HCT: 34 % — ABNORMAL LOW (ref 39.0–52.0)
Hemoglobin: 11.5 g/dL — ABNORMAL LOW (ref 13.0–17.0)
MCH: 28.3 pg (ref 26.0–34.0)
MCHC: 33.8 g/dL (ref 30.0–36.0)
MCV: 83.5 fL (ref 80.0–100.0)
Platelets: 177 10*3/uL (ref 150–400)
RBC: 4.07 MIL/uL — ABNORMAL LOW (ref 4.22–5.81)
RDW: 15.5 % (ref 11.5–15.5)
WBC: 17.5 10*3/uL — ABNORMAL HIGH (ref 4.0–10.5)
nRBC: 0 % (ref 0.0–0.2)

## 2018-10-13 LAB — GLUCOSE, CAPILLARY
Glucose-Capillary: 196 mg/dL — ABNORMAL HIGH (ref 70–99)
Glucose-Capillary: 147 mg/dL — ABNORMAL HIGH (ref 70–99)
Glucose-Capillary: 195 mg/dL — ABNORMAL HIGH (ref 70–99)

## 2018-10-13 LAB — LIPASE, BLOOD: Lipase: 49 U/L (ref 11–51)

## 2018-10-13 LAB — MAGNESIUM: Magnesium: 1.7 mg/dL (ref 1.7–2.4)

## 2018-10-13 MED ORDER — PIPERACILLIN-TAZOBACTAM 3.375 G IVPB
3.3750 g | Freq: Three times a day (TID) | INTRAVENOUS | Status: DC
  Administered 2018-10-13 – 2018-10-14 (×4): 3.375 g via INTRAVENOUS
  Filled 2018-10-13 (×4): qty 50

## 2018-10-13 MED ORDER — TRAZODONE HCL 50 MG PO TABS
50.0000 mg | ORAL_TABLET | Freq: Every evening | ORAL | Status: DC | PRN
  Administered 2018-10-13: 50 mg via ORAL
  Filled 2018-10-13: qty 1

## 2018-10-13 MED ORDER — LINAGLIPTIN 5 MG PO TABS
5.0000 mg | ORAL_TABLET | Freq: Every day | ORAL | Status: DC
  Administered 2018-10-13: 5 mg via ORAL
  Filled 2018-10-13 (×2): qty 1

## 2018-10-13 MED ORDER — GADOBUTROL 1 MMOL/ML IV SOLN
8.0000 mL | Freq: Once | INTRAVENOUS | Status: AC | PRN
  Administered 2018-10-13: 8 mL via INTRAVENOUS

## 2018-10-13 MED ORDER — LORAZEPAM 2 MG/ML IJ SOLN
1.0000 mg | Freq: Once | INTRAMUSCULAR | Status: AC
  Administered 2018-10-13: 1 mg via INTRAVENOUS
  Filled 2018-10-13: qty 1

## 2018-10-13 MED ORDER — INSULIN GLARGINE 100 UNIT/ML ~~LOC~~ SOLN
37.0000 [IU] | Freq: Two times a day (BID) | SUBCUTANEOUS | Status: DC
  Administered 2018-10-13 – 2018-10-15 (×3): 37 [IU] via SUBCUTANEOUS
  Filled 2018-10-13 (×5): qty 0.37

## 2018-10-13 MED ORDER — INSULIN ASPART 100 UNIT/ML ~~LOC~~ SOLN
0.0000 [IU] | Freq: Three times a day (TID) | SUBCUTANEOUS | Status: DC
  Administered 2018-10-13: 1 [IU] via SUBCUTANEOUS

## 2018-10-13 MED ORDER — INSULIN ASPART 100 UNIT/ML ~~LOC~~ SOLN: 0.0000 [IU] | Freq: Every day | SUBCUTANEOUS | Status: DC

## 2018-10-13 MED ORDER — ACETAMINOPHEN 325 MG PO TABS: 650.0000 mg | ORAL_TABLET | Freq: Four times a day (QID) | ORAL | Status: DC | PRN

## 2018-10-13 MED ORDER — POTASSIUM CHLORIDE IN NACL 20-0.9 MEQ/L-% IV SOLN
INTRAVENOUS | Status: DC
  Administered 2018-10-13 – 2018-10-14 (×3): via INTRAVENOUS
  Filled 2018-10-13 (×6): qty 1000

## 2018-10-13 MED ORDER — TAMSULOSIN HCL 0.4 MG PO CAPS
0.4000 mg | ORAL_CAPSULE | Freq: Every day | ORAL | Status: DC
  Administered 2018-10-13 – 2018-10-15 (×3): 0.4 mg via ORAL
  Filled 2018-10-13 (×3): qty 1

## 2018-10-13 NOTE — Consult Note (Signed)
Reason for Consult: Elevated LFT's/rectal bleeding.Marland Kitchen Referring Physician: THP.  Justin [name] is an 85 y.o. male.  HPI: Justin [name] is a 85 year old black male with multiple medical problems as below presented to the emergency room with right upper quadrant pain, nausea and vomiting,  that he had for 2 days and was found to have elevated liver function tests on arrival in the ER. He's also had some rectal bleeding which has been presumed to be from his diverticulosis. In the ER he was noted to have a total bili 1.8 with AST of 662 and an ALT of 313 and alkaline phosphatase of 213. He had an MRCP to further evaluate his abnormal LFTs and right upper quadrant pain with an MRCP was essentially normal and did not explain the patient's acute symptoms. Patient's had recent EGD and a colonoscopy in large polyp was removed from the stomach and a Hemoclip applied after the polypectomy was done; extensive diverticulosis was noted on his colonoscopy.Here a cholecystectomy in January 2019 for symptomatic cholelithiasis done by Dr. Mercy Riding.  Past Medical History:  Diagnosis Date  . Anxiety   . Arthritis   . Coronary artery disease   . Diabetes mellitus without complication ([place])   . Diverticulitis   . Dyspnea   . GERD (gastroesophageal reflux disease)/Cholelithiasis   . Hypercholesteremia   . Hypertension   . Pneumonia    as a child  . Prostate cancer ([place])   . UTI (lower urinary tract infection)    Past Surgical History:  Procedure Laterality Date  . ABDOMINAL SURGERY     for diverticulitis; also removed appendix  . CATARACT EXTRACTION    . CATARACT EXTRACTION, BILATERAL    . CHOLECYSTECTOMY N/A 10/12/2017   Procedure: LAPAROSCOPIC CHOLECYSTECTOMY;  Surgeon: [name], MD;  Location: [facility];  Service: General;  Laterality: N/A;  . CORONARY ARTERY BYPASS GRAFT  2009  . EYE SURGERY     "for bleeding in eye"  . HERNIA REPAIR    . LEFT HEART CATH AND CORONARY ANGIOGRAPHY N/A  11/04/2016   Procedure: Left Heart Cath and Coronary Angiography;  Surgeon: [name], MD;  Location: [facility];  Service: Cardiovascular;  Laterality: N/A;  . LOWER EXTREMITY ANGIOGRAPHY N/A 11/04/2016   Procedure: Lower Extremity Angiography;  Surgeon: [name], MD;  Location: [facility];  Service: Cardiovascular;  Laterality: N/A;  . PROSTATE SURGERY     Family History  Problem Relation Age of Onset  . Diabetes Father   . Diabetes Maternal Aunt   . Diabetes Maternal Uncle   . Diabetes Maternal Grandmother    Social History:  reports that he has quit smoking. He has never used smokeless tobacco. He reports that he does not drink alcohol or use drugs.  Allergies:  Allergies  Allergen Reactions  . Dye Fdc Red [Red Dye] Swelling and Other (See Comments)    CAT scan dye  . Ibuprofen Other (See Comments)    Upset stomach    Medications: I have reviewed the patient's current medications.  Results for orders placed or performed during the hospital encounter of 10/12/18 (from the past 48 hour(s))  Comprehensive metabolic panel     Status: Abnormal   Collection Time: 10/12/18  3:13 PM  Result Value Ref Range   Sodium 142 135 - 145 mmol/L   Potassium 3.8 3.5 - 5.1 mmol/L   Chloride 109 98 - 111 mmol/L   CO2 22 22 - 32 mmol/L   Glucose, Bld  205 (H) 70 - 99 mg/dL   BUN 26 (H) 8 - 23 mg/dL   Creatinine, Ser 1.62 (H) 0.61 - 1.24 mg/dL   Calcium 9.6 8.9 - 10.3 mg/dL   Total Protein 7.5 6.5 - 8.1 g/dL   Albumin 3.4 (L) 3.5 - 5.0 g/dL   AST 662 (H) 15 - 41 U/L   ALT 313 (H) 0 - 44 U/L   Alkaline Phosphatase 213 (H) 38 - 126 U/L   Total Bilirubin 1.8 (H) 0.3 - 1.2 mg/dL   GFR calc non Af Amer 39 (L) >60 mL/min   GFR calc Af Amer 45 (L) >60 mL/min   Anion gap 11 5 - 15    Comment: Performed at [facility] [address], [place], [place] [address]  CBC     Status: Abnormal   Collection Time: 10/12/18  3:13 PM  Result Value Ref Range   WBC 13.3 (H) 4.0 - 10.5  K/uL   RBC 4.74 4.22 - 5.81 MIL/uL   Hemoglobin 12.8 (L) 13.0 - 17.0 g/dL   HCT 40.6 39.0 - 52.0 %   MCV 85.7 80.0 - 100.0 fL   MCH 27.0 26.0 - 34.0 pg   MCHC 31.5 30.0 - 36.0 g/dL   RDW 15.4 11.5 - 15.5 %   Platelets 182 150 - 400 K/uL   nRBC 0.0 0.0 - 0.2 %    Comment: Performed at [facility], [place] [address], [place], [place] [address]  Type and screen [place]     Status: None   Collection Time: 10/12/18  3:13 PM  Result Value Ref Range   ABO/RH(D) B POS    Antibody Screen NEG    Sample Expiration      10/15/2018 Performed at [facility], [place] [address], [place], [place] [address]   ABO/Rh     Status: None   Collection Time: 10/12/18  3:13 PM  Result Value Ref Range   ABO/RH(D)      B POS Performed at [facility] [address], [place], [place] [address]   Lipase, blood     Status: Abnormal   Collection Time: 10/12/18  4:36 PM  Result Value Ref Range   Lipase 65 (H) 11 - 51 U/L    Comment: Performed at [facility] [address], [place], [place] [address]  POC occult blood, ED     Status: None   Collection Time: 10/12/18  9:49 PM  Result Value Ref Range   Fecal Occult Bld NEGATIVE NEGATIVE  Comprehensive metabolic panel     Status: Abnormal   Collection Time: 10/13/18  4:12 AM  Result Value Ref Range   Sodium 137 135 - 145 mmol/L   Potassium 3.1 (L) 3.5 - 5.1 mmol/L   Chloride 109 98 - 111 mmol/L   CO2 19 (L) 22 - 32 mmol/L   Glucose, Bld 200 (H) 70 - 99 mg/dL   BUN 27 (H) 8 - 23 mg/dL   Creatinine, Ser 1.70 (H) 0.61 - 1.24 mg/dL   Calcium 8.9 8.9 - 10.3 mg/dL   Total Protein 6.2 (L) 6.5 - 8.1 g/dL   Albumin 2.7 (L) 3.5 - 5.0 g/dL   AST 403 (H) 15 - 41 U/L   ALT 380 (H) 0 - 44 U/L   Alkaline Phosphatase 211 (H) 38 - 126 U/L   Total Bilirubin 4.0 (H) 0.3 - 1.2 mg/dL   GFR calc non Af  Amer 36 (L) >60 mL/min   GFR calc Af Amer 42 (L) >60 mL/min   Anion gap 9 5 - 15    Comment: Performed at [facility] [address], [place], [place] [address]  CBC     Status: Abnormal   Collection Time: 10/13/18  4:12 AM  Result Value Ref Range   WBC 17.5 (H) 4.0 - 10.5 K/uL   RBC 4.07 (L) 4.22 - 5.81 MIL/uL   Hemoglobin 11.5 (L) 13.0 - 17.0 g/dL   HCT 34.0 (L) 39.0 - 52.0 %   MCV 83.5 80.0 - 100.0 fL   MCH 28.3 26.0 - 34.0 pg   MCHC 33.8 30.0 - 36.0 g/dL   RDW 15.5 11.5 - 15.5 %   Platelets 177 150 - 400 K/uL   nRBC 0.0 0.0 - 0.2 %    Comment: Performed at [facility], [place] [address] [place], [place] [address]  Lipase, blood     Status: None   Collection Time: 10/13/18  4:12 AM  Result Value Ref Range   Lipase 49 11 - 51 U/L    Comment: Performed at [facility] [address], [place], [place] [address]  Magnesium     Status: None   Collection Time: 10/13/18  4:12 AM  Result Value Ref Range   Magnesium 1.7 1.7 - 2.4 mg/dL    Comment: Performed at [facility] [address], [place], [place] [address]  Glucose, capillary     Status: Abnormal   Collection Time: 10/13/18 12:43 PM  Result Value Ref Range   Glucose-Capillary 196 (H) 70 - 99 mg/dL  Glucose, capillary     Status: Abnormal   Collection Time: 10/13/18  4:31 PM  Result Value Ref Range   Glucose-Capillary 147 (H) 70 - 99 mg/dL    Ct Abdomen Pelvis W Contrast  Result Date: 10/12/2018 CLINICAL DATA:  Abdominal pain with dark stools EXAM: CT ABDOMEN AND PELVIS WITH CONTRAST TECHNIQUE: Multidetector CT imaging of the abdomen and pelvis was performed using the standard protocol following bolus administration of intravenous contrast. CONTRAST:  162mL OMNIPAQUE 300 COMPARISON:  None. FINDINGS: Lower chest: No acute abnormality. Hepatobiliary: No focal liver abnormality is seen. Status post cholecystectomy. No biliary dilatation. Pancreas: Unremarkable. No pancreatic ductal dilatation or surrounding inflammatory changes. Spleen: Normal in size without focal abnormality. Adrenals/Urinary Tract: Adrenal glands are  unremarkable. Kidneys are normal, without renal calculi, focal lesion, or hydronephrosis. Bladder is unremarkable. Stomach/Bowel: Postsurgical changes are noted within the sigmoid colon. Diverticular change is noted without evidence of diverticulitis. The appendix is not well visualized. No inflammatory changes to suggest appendicitis are noted. Sliding-type hiatal hernia is noted. Stomach is filled with fluid without definitive obstructive change. Small metallic foreign body is noted within the stomach of uncertain chronicity. No small bowel obstructive changes noted. Vascular/Lymphatic: Aortic atherosclerosis. No enlarged abdominal or pelvic lymph nodes. Reproductive: Status post prostatectomy. Other: No abdominal wall hernia or abnormality. No abdominopelvic ascites. Musculoskeletal: Degenerative changes of the lumbar spine are noted. IMPRESSION: Fluid-filled stomach with evidence of sliding-type hiatal hernia. Small metallic foreign body is noted within the midportion of the gastric lumen of uncertain chronicity. Correlate with any recent ingestion or procedure. Diverticulosis without diverticulitis. No acute abnormality is noted. Electronically Signed   By: Inez Catalina M.D.   On: 10/12/2018 17:56   Mr 3d Recon At Scanner  Result Date: 10/13/2018 CLINICAL DATA:  Chronic abdominal pain, recently worsening after eating.  Nausea and vomiting without diarrhea. Newly elevated liver function studies. History of diabetes. EXAM: MRI ABDOMEN WITHOUT AND WITH CONTRAST (INCLUDING MRCP) TECHNIQUE: Multiplanar multisequence MR imaging of the abdomen was performed both before and after the administration of intravenous contrast. Heavily T2-weighted images of the biliary and pancreatic ducts were obtained, and three-dimensional MRCP images were rendered by post processing. CONTRAST:  8 cc Gadavist COMPARISON:  Abdominal CT and ultrasound 10/12/2018. FINDINGS: Despite efforts by the technologist and patient, mild motion  artifact is present on today's exam and could not be eliminated. This reduces exam sensitivity and specificity. The motion is greatest towards the end of the study, including the postcontrast images. Lower chest: Small hiatal hernia and previous median sternotomy. The visualized lower chest otherwise appears unremarkable. Hepatobiliary: No significant hepatic steatosis. Transient area of subcapsular enhancement laterally in the right hepatic lobe on the early postcontrast images (image 61/1301) is likely an incidental transient vascular phenomenon. No suspicious focal hepatic findings. Previous cholecystectomy. The common hepatic duct measures 9 mm in diameter. No evidence of choledocholithiasis. Pancreas: Unremarkable. No pancreatic ductal dilatation or surrounding inflammatory changes. No evidence of pancreas divisum. Spleen: Normal in size without focal abnormality. Adrenals/Urinary Tract: Both adrenal glands appear normal. Mild renal cortical thinning bilaterally. No evidence of renal mass or hydronephrosis. Stomach/Bowel: No evidence of bowel wall thickening, distention or surrounding inflammatory change.Prominent artifact in the distal stomach attributed to metallic foreign body demonstrated on previous CT. Vascular/Lymphatic: There are no enlarged abdominal lymph nodes. No acute vascular findings. Mild aortic and branch vessel atherosclerosis, better seen on CT. Other: No ascites. Musculoskeletal: No acute or significant osseous findings. IMPRESSION: 1. No acute findings or explanation for the patient's symptoms. 2. No significant biliary dilatation post cholecystectomy. No evidence of choledocholithiasis. Detail limited by breathing artifact. 3. Artifact related to foreign body in the stomach, likely postsurgical based on provided history. Electronically Signed   By: Richardean Sale M.D.   On: 10/13/2018 16:12   US Abdomen Limited  Result Date: 10/12/2018 CLINICAL DATA:  Upper abdominal pain EXAM:  ULTRASOUND ABDOMEN LIMITED RIGHT UPPER QUADRANT COMPARISON:  CT abdomen and pelvis 10/12/2018 FINDINGS: Gallbladder: Surgically absent Common bile duct: Diameter: 9 mm diameter, may be normal for patient of this age post cholecystectomy. Liver: Normal parenchymal echogenicity without mass or nodularity. No intrahepatic biliary dilatation. Portal vein is patent on color Doppler imaging with normal direction of blood flow towards the liver. No RIGHT upper quadrant free fluid. IMPRESSION: Post cholecystectomy. 9 mm CBD which may be normal for a patient of this age post cholecystectomy, recommend correlation with LFTs. No focal hepatic sonographic abnormalities. Electronically Signed   By: Lavonia Dana M.D.   On: 10/12/2018 19:31   Mr Abdomen Mrcp Moise Boring Contast  Result Date: 10/13/2018 CLINICAL DATA:  Chronic abdominal pain, recently worsening after eating. Nausea and vomiting without diarrhea. Newly elevated liver function studies. History of diabetes. EXAM: MRI ABDOMEN WITHOUT AND WITH CONTRAST (INCLUDING MRCP) TECHNIQUE: Multiplanar multisequence MR imaging of the abdomen was performed both before and after the administration of intravenous contrast. Heavily T2-weighted images of the biliary and pancreatic ducts were obtained, and three-dimensional MRCP images were rendered by post processing. CONTRAST:  8 cc Gadavist COMPARISON:  Abdominal CT and ultrasound 10/12/2018. FINDINGS: Despite efforts by the technologist and patient, mild motion artifact is present on today's exam and could not be eliminated. This reduces exam sensitivity and specificity. The motion is greatest towards the end of the study, including the postcontrast images. Lower  chest: Small hiatal hernia and previous median sternotomy. The visualized lower chest otherwise appears unremarkable. Hepatobiliary: No significant hepatic steatosis. Transient area of subcapsular enhancement laterally in the right hepatic lobe on the early postcontrast images  (image 61/1301) is likely an incidental transient vascular phenomenon. No suspicious focal hepatic findings. Previous cholecystectomy. The common hepatic duct measures 9 mm in diameter. No evidence of choledocholithiasis. Pancreas: Unremarkable. No pancreatic ductal dilatation or surrounding inflammatory changes. No evidence of pancreas divisum. Spleen: Normal in size without focal abnormality. Adrenals/Urinary Tract: Both adrenal glands appear normal. Mild renal cortical thinning bilaterally. No evidence of renal mass or hydronephrosis. Stomach/Bowel: No evidence of bowel wall thickening, distention or surrounding inflammatory change.Prominent artifact in the distal stomach attributed to metallic foreign body demonstrated on previous CT. Vascular/Lymphatic: There are no enlarged abdominal lymph nodes. No acute vascular findings. Mild aortic and branch vessel atherosclerosis, better seen on CT. Other: No ascites. Musculoskeletal: No acute or significant osseous findings. IMPRESSION: 1. No acute findings or explanation for the patient's symptoms. 2. No significant biliary dilatation post cholecystectomy. No evidence of choledocholithiasis. Detail limited by breathing artifact. 3. Artifact related to foreign body in the stomach, likely postsurgical based on provided history. Electronically Signed   By: Richardean Sale M.D.   On: 10/13/2018 16:12    Review of Systems  Constitutional: Positive for malaise/fatigue.  HENT: Negative.   Eyes: Negative.   Respiratory: Negative.   Cardiovascular: Negative.   Gastrointestinal: Positive for abdominal pain, blood in stool, nausea and vomiting. Negative for constipation and diarrhea.  Genitourinary: Negative.   Skin: Negative.   Neurological: Negative.   Endo/Heme/Allergies: Negative.   Psychiatric/Behavioral: Negative.    Blood pressure (!) 131/54, pulse (!) 58, temperature 98.8 F (37.1 C), temperature source Oral, resp. rate (!) 29, height 5' 7.5" (1.715 m),  weight 82.6 kg, SpO2 98 %. Physical Exam  Constitutional: He is oriented to person, place, and time. He appears well-developed and well-nourished.  HENT:  Head: Normocephalic and atraumatic.  Eyes: Conjunctivae and EOM are normal.  Neck: Normal range of motion. Neck supple.  Cardiovascular: Normal rate and regular rhythm.  Respiratory: Effort normal and breath sounds normal.  GI: Soft. Bowel sounds are normal. He exhibits no distension and no mass. There is no hepatosplenomegaly. There is abdominal tenderness in the right upper quadrant, epigastric area and periumbilical area. There is guarding. There is no rebound and no CVA tenderness.  Musculoskeletal: Normal range of motion.  Neurological: He is alert and oriented to person, place, and time.  Skin: Skin is warm and dry.  Psychiatric: He has a normal mood and affect. His behavior is normal. Judgment and thought content normal.   Assessment/Plan: 1) Elevated liver function tests with right upper quadrant and periumbilical pain, nausea and vomiting-patient may have passed a stone. We need to monitor his LFTs closely. 2) Rectal bleeding with history of pan diverticulosis and colonic polyps.with mild anemia monitor CBCs closely. 3) Chronic kidney disease stage III. 4) HTN/CAD. 5) MGUS-followed by Dr.Kale. 6) IDDM. Marland Kitchen Eric Nees 10/13/2018, 5:12 PM

## 2018-10-13 NOTE — Progress Notes (Signed)
Pharmacy Antibiotic Note  Justin [name] is a 85 y.o. male admitted on 10/12/2018 with abdominal pain.  Pharmacy has been consulted for Zosyn dosing for intra-abdominal infection.  He is s/p recent egd and colonscopy several polyps removed and also something removed in stomach that was clipped.  GI consulted -MRCP per Dr. Benson [place] U/S shows dilated CBD -trend LFTS-- bili trending up  -heme occult negative - WBC 13.3>17.5, afebrile -Scr 1.62>1.72   Plan: Zosyn 3.375 g IV q8h (4 hour infusion) Monitor clinical status, renal function and culture results daily.    Height: 5' 7.5" (171.5 cm) Weight: 182 lb 1.6 oz (82.6 kg) IBW/kg (Calculated) : 67.25  Temp (24hrs), Avg:98.5 F (36.9 C), Min:97.8 F (36.6 C), Max:99 F (37.2 C)  Recent Labs  Lab 10/12/18 1513 10/13/18 0412  WBC 13.3* 17.5*  CREATININE 1.62* 1.70*    Estimated Creatinine Clearance: 34.2 mL/min (A) (by C-G formula based on SCr of 1.7 mg/dL (H)).    Allergies  Allergen Reactions  . Dye Fdc Red [Red Dye] Swelling and Other (See Comments)    CAT scan dye  . Ibuprofen Other (See Comments)    Upset stomach     Antimicrobials this admission: Zosyn 11/15>>  Dose adjustments this admission:  Microbiology results: None at this time.   Thank you for allowing pharmacy to be a part of this patient's care. Nicole Cella, RPh Clinical Pharmacist Please check AMION for all [facility] phone numbers After 10:00 PM, call [facility] [phone] 10/13/2018 2:23 PM

## 2018-10-13 NOTE — Progress Notes (Signed)
Progress Note    Justin [name]  HQP:[id number] DOB: 1932-10-06  DOA: 10/12/2018 PCP: [name], MD    Brief Narrative:     Medical records reviewed and are as summarized below:  Justin [name] is an 85 y.o. male with medical history significant of chronic abdominal pain being worked up by dr hung as outpt with recent egd and colonscopy several polyps removed and also something removed in stomach that was clipped (do not have records available ) comes in with worsening abdominal pain that is periumbilical across mid abd worse with eating.   Assessment/Plan:   Principal Problem:   Abdominal pain Active Problems:   Hypertension   GERD (gastroesophageal reflux disease)   Coronary artery disease   Abnormal liver function tests   Foreign body in stomach  Abdominal pain -GI consulted (hung) -MRCP per Dr. Benson Norway (patient to be dosed with ativan prior) U/S shows dilated CBD -trend LFTS-- bili trending up -viral hepatitis panel -GB removal in Jan 2019 -heme occult negative -start IV Abx for now (WBCs trending up-- monitor for fever)  Elevated liver function tests -viral hepatitis panel pending -trending down  Hypokalemia -replete in IV -check MG  CKD stage III -monitor -follows with Dr. Posey Pronto  MGUS -follows with Dr. Irene Limbo yearly  Insulin dependant DM -SSI -resume insulin after MRCP and no longer NPO    Hypertension - resume home meds as able    Coronary artery disease -stable    Foreign body in stomach - pt reports he had something clipped with recent EGD, await GI   Family Communication/Anticipated D/C date and plan/Code Status   DVT prophylaxis: scd Code Status: Full Code.  Family Communication:  Disposition Plan: pending MRCP   Medical Consultants:   GI (hung)    Subjective:   Can not have MRI without medications  Objective:    Vitals:   10/12/18 1945 10/12/18 2000 10/13/18 0031 10/13/18 0557  BP: (!) 141/61 (!) 134/56 (!)  158/64 (!) 133/57  Pulse: 77 74 78 61  Resp: (!) 22 (!) 35 20 20  Temp:   99 F (37.2 C) 98.2 F (36.8 C)  TempSrc:   Oral Oral  SpO2: 98% 97% 98% 96%  Weight:   82.6 kg   Height:   5' 7.5" (1.715 m)     Intake/Output Summary (Last 24 hours) at 10/13/2018 1330 Last data filed at 10/13/2018 0558 Gross per 24 hour  Intake 483.82 ml  Output 200 ml  Net 283.82 ml   Filed Weights   10/13/18 0031  Weight: 82.6 kg    Exam: In bed, NAD rrr No increased work of breathing +BS, tender with deep palpation No rashes or lesions  Data Reviewed:   I have personally reviewed following labs and imaging studies:  Labs: Labs show the following:   Basic Metabolic Panel: Recent Labs  Lab 10/12/18 1513 10/13/18 0412  NA 142 137  K 3.8 3.1*  CL 109 109  CO2 22 19*  GLUCOSE 205* 200*  BUN 26* 27*  CREATININE 1.62* 1.70*  CALCIUM 9.6 8.9   GFR Estimated Creatinine Clearance: 34.2 mL/min (A) (by C-G formula based on SCr of 1.7 mg/dL (H)). Liver Function Tests: Recent Labs  Lab 10/12/18 1513 10/13/18 0412  AST 662* 403*  ALT 313* 380*  ALKPHOS 213* 211*  BILITOT 1.8* 4.0*  PROT 7.5 6.2*  ALBUMIN 3.4* 2.7*   Recent Labs  Lab 10/12/18 1636 10/13/18 0412  LIPASE 65* 49   No results  for input(s): AMMONIA in the last 168 hours. Coagulation profile No results for input(s): INR, PROTIME in the last 168 hours.  CBC: Recent Labs  Lab 10/12/18 1513 10/13/18 0412  WBC 13.3* 17.5*  HGB 12.8* 11.5*  HCT 40.6 34.0*  MCV 85.7 83.5  PLT 182 177   Cardiac Enzymes: No results for input(s): CKTOTAL, CKMB, CKMBINDEX, TROPONINI in the last 168 hours. BNP (last 3 results) No results for input(s): PROBNP in the last 8760 hours. CBG: Recent Labs  Lab 10/13/18 1243  GLUCAP 196*   D-Dimer: No results for input(s): DDIMER in the last 72 hours. Hgb A1c: No results for input(s): HGBA1C in the last 72 hours. Lipid Profile: No results for input(s): CHOL, HDL, LDLCALC, TRIG,  CHOLHDL, LDLDIRECT in the last 72 hours. Thyroid function studies: No results for input(s): TSH, T4TOTAL, T3FREE, THYROIDAB in the last 72 hours.  Invalid input(s): FREET3 Anemia work up: No results for input(s): VITAMINB12, FOLATE, FERRITIN, TIBC, IRON, RETICCTPCT in the last 72 hours. Sepsis Labs: Recent Labs  Lab 10/12/18 1513 10/13/18 0412  WBC 13.3* 17.5*    Microbiology No results found for this or any previous visit (from the past 240 hour(s)).  Procedures and diagnostic studies:  Ct Abdomen Pelvis W Contrast  Result Date: 10/12/2018 CLINICAL DATA:  Abdominal pain with dark stools EXAM: CT ABDOMEN AND PELVIS WITH CONTRAST TECHNIQUE: Multidetector CT imaging of the abdomen and pelvis was performed using the standard protocol following bolus administration of intravenous contrast. CONTRAST:  151mL OMNIPAQUE 300 COMPARISON:  None. FINDINGS: Lower chest: No acute abnormality. Hepatobiliary: No focal liver abnormality is seen. Status post cholecystectomy. No biliary dilatation. Pancreas: Unremarkable. No pancreatic ductal dilatation or surrounding inflammatory changes. Spleen: Normal in size without focal abnormality. Adrenals/Urinary Tract: Adrenal glands are unremarkable. Kidneys are normal, without renal calculi, focal lesion, or hydronephrosis. Bladder is unremarkable. Stomach/Bowel: Postsurgical changes are noted within the sigmoid colon. Diverticular change is noted without evidence of diverticulitis. The appendix is not well visualized. No inflammatory changes to suggest appendicitis are noted. Sliding-type hiatal hernia is noted. Stomach is filled with fluid without definitive obstructive change. Small metallic foreign body is noted within the stomach of uncertain chronicity. No small bowel obstructive changes noted. Vascular/Lymphatic: Aortic atherosclerosis. No enlarged abdominal or pelvic lymph nodes. Reproductive: Status post prostatectomy. Other: No abdominal wall hernia or  abnormality. No abdominopelvic ascites. Musculoskeletal: Degenerative changes of the lumbar spine are noted. IMPRESSION: Fluid-filled stomach with evidence of sliding-type hiatal hernia. Small metallic foreign body is noted within the midportion of the gastric lumen of uncertain chronicity. Correlate with any recent ingestion or procedure. Diverticulosis without diverticulitis. No acute abnormality is noted. Electronically Signed   By: Inez Catalina M.D.   On: 10/12/2018 17:56   US Abdomen Limited  Result Date: 10/12/2018 CLINICAL DATA:  Upper abdominal pain EXAM: ULTRASOUND ABDOMEN LIMITED RIGHT UPPER QUADRANT COMPARISON:  CT abdomen and pelvis 10/12/2018 FINDINGS: Gallbladder: Surgically absent Common bile duct: Diameter: 9 mm diameter, may be normal for patient of this age post cholecystectomy. Liver: Normal parenchymal echogenicity without mass or nodularity. No intrahepatic biliary dilatation. Portal vein is patent on color Doppler imaging with normal direction of blood flow towards the liver. No RIGHT upper quadrant free fluid. IMPRESSION: Post cholecystectomy. 9 mm CBD which may be normal for a patient of this age post cholecystectomy, recommend correlation with LFTs. No focal hepatic sonographic abnormalities. Electronically Signed   By: Lavonia Dana M.D.   On: 10/12/2018 19:31    Medications:   .  aspirin EC  81 mg Oral Daily  . insulin aspart  0-5 Units Subcutaneous QHS  . insulin aspart  0-9 Units Subcutaneous TID WC  . insulin glargine  37 Units Subcutaneous BID  . isosorbide mononitrate  60 mg Oral Daily  . linagliptin  5 mg Oral Daily  . LORazepam  1 mg Intravenous Once  . metoprolol tartrate  50 mg Oral BID  . tamsulosin  0.4 mg Oral Daily   Continuous Infusions: . 0.9 % NaCl with KCl 20 mEq / L       LOS: 0 days   Geradine Girt  [facility]   *Please refer to [email or web address], password [email or web address] to get updated schedule on who will round on this patient, as hospitalists switch  teams weekly. If 7PM-7AM, please contact night-coverage at [email or web address], password [email or web address] for any overnight needs.  10/13/2018, 1:30 PM

## 2018-10-14 DIAGNOSIS — R1013 Epigastric pain: Secondary | ICD-10-CM | POA: Diagnosis not present

## 2018-10-14 DIAGNOSIS — R112 Nausea with vomiting, unspecified: Secondary | ICD-10-CM | POA: Diagnosis not present

## 2018-10-14 DIAGNOSIS — R945 Abnormal results of liver function studies: Secondary | ICD-10-CM | POA: Diagnosis not present

## 2018-10-14 DIAGNOSIS — R748 Abnormal levels of other serum enzymes: Secondary | ICD-10-CM | POA: Diagnosis not present

## 2018-10-14 DIAGNOSIS — I10 Essential (primary) hypertension: Secondary | ICD-10-CM | POA: Diagnosis not present

## 2018-10-14 DIAGNOSIS — E119 Type 2 diabetes mellitus without complications: Secondary | ICD-10-CM | POA: Diagnosis not present

## 2018-10-14 DIAGNOSIS — R1084 Generalized abdominal pain: Secondary | ICD-10-CM | POA: Diagnosis not present

## 2018-10-14 DIAGNOSIS — Z794 Long term (current) use of insulin: Secondary | ICD-10-CM | POA: Diagnosis not present

## 2018-10-14 DIAGNOSIS — R740 Nonspecific elevation of levels of transaminase and lactic acid dehydrogenase [LDH]: Secondary | ICD-10-CM | POA: Diagnosis not present

## 2018-10-14 DIAGNOSIS — E1169 Type 2 diabetes mellitus with other specified complication: Secondary | ICD-10-CM | POA: Diagnosis not present

## 2018-10-14 LAB — COMPREHENSIVE METABOLIC PANEL
ALT: 219 U/L — ABNORMAL HIGH (ref 0–44)
AST: 141 U/L — ABNORMAL HIGH (ref 15–41)
Albumin: 2.5 g/dL — ABNORMAL LOW (ref 3.5–5.0)
Alkaline Phosphatase: 181 U/L — ABNORMAL HIGH (ref 38–126)
Anion gap: 9 (ref 5–15)
BUN: 19 mg/dL (ref 8–23)
CO2: 19 mmol/L — ABNORMAL LOW (ref 22–32)
Calcium: 8.9 mg/dL (ref 8.9–10.3)
Chloride: 113 mmol/L — ABNORMAL HIGH (ref 98–111)
Creatinine, Ser: 1.7 mg/dL — ABNORMAL HIGH (ref 0.61–1.24)
GFR calc Af Amer: 42 mL/min — ABNORMAL LOW (ref >60)
GFR calc non Af Amer: 36 mL/min — ABNORMAL LOW (ref >60)
Glucose, Bld: 117 mg/dL — ABNORMAL HIGH (ref 70–99)
Potassium: 3.3 mmol/L — ABNORMAL LOW (ref 3.5–5.1)
Sodium: 141 mmol/L (ref 135–145)
Total Bilirubin: 2.6 mg/dL — ABNORMAL HIGH (ref 0.3–1.2)
Total Protein: 5.9 g/dL — ABNORMAL LOW (ref 6.5–8.1)

## 2018-10-14 LAB — HEPATITIS PANEL, ACUTE
HCV Ab: <0.1 s/co ratio (ref 0.0–0.9)
Hep A IgM: NEGATIVE
Hep B C IgM: NEGATIVE
Hepatitis B Surface Ag: NEGATIVE

## 2018-10-14 LAB — GLUCOSE, CAPILLARY
Glucose-Capillary: 96 mg/dL (ref 70–99)
Glucose-Capillary: 93 mg/dL (ref 70–99)
Glucose-Capillary: 123 mg/dL — ABNORMAL HIGH (ref 70–99)
Glucose-Capillary: 135 mg/dL — ABNORMAL HIGH (ref 70–99)
Glucose-Capillary: 128 mg/dL — ABNORMAL HIGH (ref 70–99)

## 2018-10-14 LAB — CBC
HCT: 34.8 % — ABNORMAL LOW (ref 39.0–52.0)
Hemoglobin: 11.5 g/dL — ABNORMAL LOW (ref 13.0–17.0)
MCH: 28 pg (ref 26.0–34.0)
MCHC: 33 g/dL (ref 30.0–36.0)
MCV: 84.7 fL (ref 80.0–100.0)
Platelets: 164 10*3/uL (ref 150–400)
RBC: 4.11 MIL/uL — ABNORMAL LOW (ref 4.22–5.81)
RDW: 15.8 % — ABNORMAL HIGH (ref 11.5–15.5)
WBC: 10.5 10*3/uL (ref 4.0–10.5)
nRBC: 0 % (ref 0.0–0.2)

## 2018-10-14 MED ORDER — MAGNESIUM SULFATE 2 GM/50ML IV SOLN
2.0000 g | Freq: Once | INTRAVENOUS | Status: AC
  Administered 2018-10-14: 2 g via INTRAVENOUS
  Filled 2018-10-14: qty 50

## 2018-10-14 MED ORDER — POTASSIUM CHLORIDE CRYS ER 20 MEQ PO TBCR
40.0000 meq | EXTENDED_RELEASE_TABLET | Freq: Once | ORAL | Status: AC
  Administered 2018-10-14: 40 meq via ORAL
  Filled 2018-10-14: qty 2

## 2018-10-14 NOTE — Progress Notes (Signed)
Physical Therapy Evaluation Patient Details Name: Justin [name] MRN: [id number] DOB: Jan 11, 1933 Today's Date: 10/14/2018   History of Present Illness  Patient is a 85 y/o male admitted 1/14 secondary to RUQ pain, nausea, and vomiting. Patient experienced abdominal pain and elevated liver enzymes. Symptoms consistent with passed stone/sludge.  CT results showing metalic object likely from previous procedure. PMH includes HTN, GERD, CAD, post cholecysteectomy, and diverticulosis.    Clinical Impression  Patient admitted to hospital secondary to problems above with deficits below. Patient required min guard to supervision for all mobility tasks with use of RW. Educated patient and family on using a RW at home due to unsteadiness to prevent falls until patient regains strength. Patient would benefit from home health services following discharge to increase strength and safety at home due to increased risk of falls. Will follow patient acutely to maximize independence and safety with functional mobility.     Follow Up Recommendations Home health PT    Equipment Recommendations  Rolling walker with 5" wheels    Recommendations for Other Services       Precautions / Restrictions Precautions Precautions: Fall Restrictions Weight Bearing Restrictions: No      Mobility  Bed Mobility Overal bed mobility: Modified Independent             General bed mobility comments: increased time to sit EOB  Transfers Overall transfer level: Needs assistance Equipment used: 1 person hand held assist Transfers: Sit to/from Stand Sit to Stand: Min guard         General transfer comment: Patient required min guard with 1 hand held assist for sit to stand with posterior sway upon standing. Patient balance improved when using RW   Ambulation/Gait Ambulation/Gait assistance: Supervision;Min guard Gait Distance (Feet): 125 Feet Assistive device: Rolling walker (2 wheeled) Gait Pattern/deviations:  Step-through pattern;Decreased step length - right;Decreased step length - left Gait velocity: decreased Gait velocity interpretation: <1.8 ft/sec, indicate of risk for recurrent falls General Gait Details: Patient had decreased bilateral step length and decreased gait speed. Required min guard to supervison for safety with RW. Educated about use of RW at home to increase safety/stability at home.  Stairs            Wheelchair Mobility    Modified Rankin (Stroke Patients Only)       Balance Overall balance assessment: Needs assistance Sitting-balance support: Feet unsupported;Bilateral upper extremity supported Sitting balance-Leahy Scale: Good   Postural control: Other (comment)(postior and anterior sway while static standing) Standing balance support: Single extremity supported;Bilateral upper extremity supported Standing balance-Leahy Scale: Poor Standing balance comment: required single HHA and BUE support with RW while standing and ambulating.                              Pertinent Vitals/Pain Pain Assessment: Faces Faces Pain Scale: Hurts a little bit Pain Location: back Pain Descriptors / Indicators: Guarding Pain Intervention(s): Limited activity within patient's tolerance;Monitored during session    [place] expects to be discharged to:: Private residence Living Arrangements: Children Available Help at Discharge: Family;Available 24 hours/day(daugter and family will help after d/c) Type of Home: House Home Access: Level entry     Home Layout: Two level;Able to live on main level with bedroom/bathroom Home Equipment: Kasandra Knudsen - single point      Prior Function Level of Independence: Independent with assistive device(s)         Comments: uses cane at home  Hand Dominance        Extremity/Trunk Assessment   Upper Extremity Assessment Upper Extremity Assessment: Overall WFL for tasks assessed    Lower Extremity  Assessment Lower Extremity Assessment: Generalized weakness;RLE deficits/detail;LLE deficits/detail(bilateral peripheral neuropathy) RLE Sensation: history of peripheral neuropathy LLE Sensation: history of peripheral neuropathy    Cervical / Trunk Assessment Cervical / Trunk Assessment: Normal  Communication   Communication: No difficulties  Cognition Arousal/Alertness: Awake/alert Behavior During Therapy: WFL for tasks assessed/performed Overall Cognitive Status: Within Functional Limits for tasks assessed                                        General Comments General comments (skin integrity, edema, etc.): Daughter and granddaughter in room. Patient reports increased weakness and unsteadiness prior to admission.    Exercises General Exercises - Lower Extremity Long Arc Quad: AROM;Both;5 reps;Seated Hip Flexion/Marching: AROM;5 reps;Standing(pre gait activity prior to ambulating )   Assessment/Plan    PT Assessment Patient needs continued PT services  PT Problem List Decreased strength;Decreased range of motion;Decreased activity tolerance;Decreased balance;Decreased mobility;Decreased knowledge of use of DME;Impaired sensation       PT Treatment Interventions DME instruction;Gait training;Functional mobility training;Therapeutic activities;Therapeutic exercise;Balance training;Patient/family education    PT Goals (Current goals can be found in the Care Plan section)  Acute Rehab PT Goals Patient Stated Goal: to get stronger PT Goal Formulation: With patient Time For Goal Achievement: 10/28/18 Potential to Achieve Goals: Good    Frequency Min 3X/week   Barriers to discharge        Co-evaluation               AM-PAC PT "6 Clicks" Mobility  Outcome Measure Help needed turning from your back to your side while in a flat bed without using bedrails?: None Help needed moving from lying on your back to sitting on the side of a flat bed without  using bedrails?: None Help needed moving to and from a bed to a chair (including a wheelchair)?: A Little Help needed standing up from a chair using your arms (e.g., wheelchair or bedside chair)?: A Little Help needed to walk in hospital room?: A Little Help needed climbing 3-5 steps with a railing? : A Lot 6 Click Score: 19    End of Session Equipment Utilized During Treatment: Gait belt Activity Tolerance: Patient tolerated treatment well Patient left: in bed;with call bell/phone within reach;with family/visitor present(sitting EOB) Nurse Communication: Mobility status PT Visit Diagnosis: Unsteadiness on feet (R26.81);Muscle weakness (generalized) (M62.81)    Time: 3833-3832 PT Time Calculation (min) (ACUTE ONLY): 18 min   Charges:   PT Evaluation $PT Eval Low Complexity: 1 Low          Erick Blinks, SPT   Erick Blinks 10/14/2018, 1:53 PM

## 2018-10-14 NOTE — Progress Notes (Signed)
Subjective: No further vomiting.  Minimal abdominal pain.  Objective: Vital signs in last 24 hours: Temp:  [98.8 F (37.1 C)-99.6 F (37.6 C)] 99.6 F (37.6 C) (01/16 0535) Pulse Rate:  [58-68] 64 (01/16 0535) Resp:  [18-29] 18 (01/16 0535) BP: (106-139)/(54-81) 106/81 (01/16 0535) SpO2:  [98 %-99 %] 99 % (01/16 0535) Last BM Date: 10/10/18  Intake/Output from previous day: 01/15 0701 - 01/16 0700 In: 2047.8 [I.V.:1999.4; IV Piggyback:48.4] Out: 800 [Urine:800] Intake/Output this shift: No intake/output data recorded.  General appearance: alert and no distress GI: soft, non-tender; bowel sounds normal; no masses,  no organomegaly  Lab Results: Recent Labs    10/12/18 1513 10/13/18 0412  WBC 13.3* 17.5*  HGB 12.8* 11.5*  HCT 40.6 34.0*  PLT 182 177   BMET Recent Labs    10/12/18 1513 10/13/18 0412  NA 142 137  K 3.8 3.1*  CL 109 109  CO2 22 19*  GLUCOSE 205* 200*  BUN 26* 27*  CREATININE 1.62* 1.70*  CALCIUM 9.6 8.9   LFT Recent Labs    10/13/18 0412  PROT 6.2*  ALBUMIN 2.7*  AST 403*  ALT 380*  ALKPHOS 211*  BILITOT 4.0*   PT/INR No results for input(s): LABPROT, INR in the last 72 hours. Hepatitis Panel Recent Labs    10/12/18 2018  HEPBSAG Negative  HCVAB <0.1  HEPAIGM Negative  HEPBIGM Negative   C-Diff No results for input(s): CDIFFTOX in the last 72 hours. Fecal Lactopherrin No results for input(s): FECLLACTOFRN in the last 72 hours.  Studies/Results: Ct Abdomen Pelvis W Contrast  Result Date: 10/12/2018 CLINICAL DATA:  Abdominal pain with dark stools EXAM: CT ABDOMEN AND PELVIS WITH CONTRAST TECHNIQUE: Multidetector CT imaging of the abdomen and pelvis was performed using the standard protocol following bolus administration of intravenous contrast. CONTRAST:  18mL OMNIPAQUE 300 COMPARISON:  None. FINDINGS: Lower chest: No acute abnormality. Hepatobiliary: No focal liver abnormality is seen. Status post cholecystectomy. No biliary  dilatation. Pancreas: Unremarkable. No pancreatic ductal dilatation or surrounding inflammatory changes. Spleen: Normal in size without focal abnormality. Adrenals/Urinary Tract: Adrenal glands are unremarkable. Kidneys are normal, without renal calculi, focal lesion, or hydronephrosis. Bladder is unremarkable. Stomach/Bowel: Postsurgical changes are noted within the sigmoid colon. Diverticular change is noted without evidence of diverticulitis. The appendix is not well visualized. No inflammatory changes to suggest appendicitis are noted. Sliding-type hiatal hernia is noted. Stomach is filled with fluid without definitive obstructive change. Small metallic foreign body is noted within the stomach of uncertain chronicity. No small bowel obstructive changes noted. Vascular/Lymphatic: Aortic atherosclerosis. No enlarged abdominal or pelvic lymph nodes. Reproductive: Status post prostatectomy. Other: No abdominal wall hernia or abnormality. No abdominopelvic ascites. Musculoskeletal: Degenerative changes of the lumbar spine are noted. IMPRESSION: Fluid-filled stomach with evidence of sliding-type hiatal hernia. Small metallic foreign body is noted within the midportion of the gastric lumen of uncertain chronicity. Correlate with any recent ingestion or procedure. Diverticulosis without diverticulitis. No acute abnormality is noted. Electronically Signed   By: Inez Catalina M.D.   On: 10/12/2018 17:56   Mr 3d Recon At Scanner  Result Date: 10/13/2018 CLINICAL DATA:  Chronic abdominal pain, recently worsening after eating. Nausea and vomiting without diarrhea. Newly elevated liver function studies. History of diabetes. EXAM: MRI ABDOMEN WITHOUT AND WITH CONTRAST (INCLUDING MRCP) TECHNIQUE: Multiplanar multisequence MR imaging of the abdomen was performed both before and after the administration of intravenous contrast. Heavily T2-weighted images of the biliary and pancreatic ducts were obtained, and  three-dimensional  MRCP images were rendered by post processing. CONTRAST:  8 cc Gadavist COMPARISON:  Abdominal CT and ultrasound 10/12/2018. FINDINGS: Despite efforts by the technologist and patient, mild motion artifact is present on today's exam and could not be eliminated. This reduces exam sensitivity and specificity. The motion is greatest towards the end of the study, including the postcontrast images. Lower chest: Small hiatal hernia and previous median sternotomy. The visualized lower chest otherwise appears unremarkable. Hepatobiliary: No significant hepatic steatosis. Transient area of subcapsular enhancement laterally in the right hepatic lobe on the early postcontrast images (image 61/1301) is likely an incidental transient vascular phenomenon. No suspicious focal hepatic findings. Previous cholecystectomy. The common hepatic duct measures 9 mm in diameter. No evidence of choledocholithiasis. Pancreas: Unremarkable. No pancreatic ductal dilatation or surrounding inflammatory changes. No evidence of pancreas divisum. Spleen: Normal in size without focal abnormality. Adrenals/Urinary Tract: Both adrenal glands appear normal. Mild renal cortical thinning bilaterally. No evidence of renal mass or hydronephrosis. Stomach/Bowel: No evidence of bowel wall thickening, distention or surrounding inflammatory change.Prominent artifact in the distal stomach attributed to metallic foreign body demonstrated on previous CT. Vascular/Lymphatic: There are no enlarged abdominal lymph nodes. No acute vascular findings. Mild aortic and branch vessel atherosclerosis, better seen on CT. Other: No ascites. Musculoskeletal: No acute or significant osseous findings. IMPRESSION: 1. No acute findings or explanation for the patient's symptoms. 2. No significant biliary dilatation post cholecystectomy. No evidence of choledocholithiasis. Detail limited by breathing artifact. 3. Artifact related to foreign body in the stomach, likely postsurgical  based on provided history. Electronically Signed   By: Richardean Sale M.D.   On: 10/13/2018 16:12   US Abdomen Limited  Result Date: 10/12/2018 CLINICAL DATA:  Upper abdominal pain EXAM: ULTRASOUND ABDOMEN LIMITED RIGHT UPPER QUADRANT COMPARISON:  CT abdomen and pelvis 10/12/2018 FINDINGS: Gallbladder: Surgically absent Common bile duct: Diameter: 9 mm diameter, may be normal for patient of this age post cholecystectomy. Liver: Normal parenchymal echogenicity without mass or nodularity. No intrahepatic biliary dilatation. Portal vein is patent on color Doppler imaging with normal direction of blood flow towards the liver. No RIGHT upper quadrant free fluid. IMPRESSION: Post cholecystectomy. 9 mm CBD which may be normal for a patient of this age post cholecystectomy, recommend correlation with LFTs. No focal hepatic sonographic abnormalities. Electronically Signed   By: Lavonia Dana M.D.   On: 10/12/2018 19:31   Mr Abdomen Mrcp Moise Boring Contast  Result Date: 10/13/2018 CLINICAL DATA:  Chronic abdominal pain, recently worsening after eating. Nausea and vomiting without diarrhea. Newly elevated liver function studies. History of diabetes. EXAM: MRI ABDOMEN WITHOUT AND WITH CONTRAST (INCLUDING MRCP) TECHNIQUE: Multiplanar multisequence MR imaging of the abdomen was performed both before and after the administration of intravenous contrast. Heavily T2-weighted images of the biliary and pancreatic ducts were obtained, and three-dimensional MRCP images were rendered by post processing. CONTRAST:  8 cc Gadavist COMPARISON:  Abdominal CT and ultrasound 10/12/2018. FINDINGS: Despite efforts by the technologist and patient, mild motion artifact is present on today's exam and could not be eliminated. This reduces exam sensitivity and specificity. The motion is greatest towards the end of the study, including the postcontrast images. Lower chest: Small hiatal hernia and previous median sternotomy. The visualized lower chest  otherwise appears unremarkable. Hepatobiliary: No significant hepatic steatosis. Transient area of subcapsular enhancement laterally in the right hepatic lobe on the early postcontrast images (image 61/1301) is likely an incidental transient vascular phenomenon. No suspicious focal hepatic findings. Previous cholecystectomy.  The common hepatic duct measures 9 mm in diameter. No evidence of choledocholithiasis. Pancreas: Unremarkable. No pancreatic ductal dilatation or surrounding inflammatory changes. No evidence of pancreas divisum. Spleen: Normal in size without focal abnormality. Adrenals/Urinary Tract: Both adrenal glands appear normal. Mild renal cortical thinning bilaterally. No evidence of renal mass or hydronephrosis. Stomach/Bowel: No evidence of bowel wall thickening, distention or surrounding inflammatory change.Prominent artifact in the distal stomach attributed to metallic foreign body demonstrated on previous CT. Vascular/Lymphatic: There are no enlarged abdominal lymph nodes. No acute vascular findings. Mild aortic and branch vessel atherosclerosis, better seen on CT. Other: No ascites. Musculoskeletal: No acute or significant osseous findings. IMPRESSION: 1. No acute findings or explanation for the patient's symptoms. 2. No significant biliary dilatation post cholecystectomy. No evidence of choledocholithiasis. Detail limited by breathing artifact. 3. Artifact related to foreign body in the stomach, likely postsurgical based on provided history. Electronically Signed   By: Richardean Sale M.D.   On: 10/13/2018 16:12    Medications:  Scheduled: . aspirin EC  81 mg Oral Daily  . insulin aspart  0-5 Units Subcutaneous QHS  . insulin aspart  0-9 Units Subcutaneous TID WC  . insulin glargine  37 Units Subcutaneous BID  . isosorbide mononitrate  60 mg Oral Daily  . linagliptin  5 mg Oral Daily  . metoprolol tartrate  50 mg Oral BID  . tamsulosin  0.4 mg Oral Daily   Continuous: . 0.9 % NaCl  with KCl 20 mEq / L 75 mL/hr at 10/14/18 0604  . piperacillin-tazobactam (ZOSYN)  IV 3.375 g (10/14/18 8242)    Assessment/Plan: 1) Abnormal liver enzymes. 2) ABM pain - minimal. 3) Nausea/vomiting - resolved.   The patient is clinically stable.  His liver enzymes were declining.  There are no new labs at the time of this note.  If his liver enzymes continue to decline, which is anticipated, he can be safely discharged home.  His clinical presentation is consistent with a passed stone/sludge, even though there was no evidence of any significant biliary ductal dilation.  Also the foreign body in his stomach was a prior hemoclip that was placed last year when a larger gastric polyp was resected.  LOS: 0 days   Nely Dedmon D 10/14/2018, 7:11 AM

## 2018-10-14 NOTE — Care Management Obs Status (Signed)
[place] NOTIFICATION   Patient Details  Name: Justin [name] MRN: [id number] Date of Birth: [birth year]   Medicare Observation Status Notification Given:  Yes    Midge Minium RN, BSN, NCM-BC, ACM-RN [phone] 10/14/2018, 9:22 AM

## 2018-10-14 NOTE — Progress Notes (Signed)
Patient c/o nausea. Attempted to give zofran IVP, but PIV leaking with flushing. New order placed for IV placement by IV team d/t difficult stick. Assisted patient to bathroom, patient states he feels weak, balance issues noted. Patient assisted back to bed.

## 2018-10-14 NOTE — Progress Notes (Signed)
Progress Note    Justin [name]  WNU:[id number] DOB: [birth year]  DOA: 10/12/2018 PCP: Justin Mccreedy, MD    Brief Narrative:     Medical records reviewed and are as summarized below:   Justin [name] is an 85 y.o. male with medical history significant of chronic abdominal pain being worked up by dr Justin [name] as outpt with recent egd and colonscopy several polyps removed and also something removed in stomach that was clipped (do not have records available ) comes in with worsening abdominal pain that is periumbilical across mid abd worse with eating but better with bowel movements.    Assessment/Plan:   Principal Problem:   Abdominal pain Active Problems:   Hypertension   GERD (gastroesophageal reflux disease)   Coronary artery disease   Abnormal liver function tests   Foreign body in stomach  Abdominal pain -GI consulted (Justin [name]) -MRCP per Dr. Benson [name] (patient to be dosed with ativan prior) U/S shows dilated CBD -GB removal in Jan 2019 -heme occult negative -?diabetic gastroparesis:  Gastric emptying ordered for AM (has been diabetic for 35 years) -pain not like prior diverticulitis episodes  Elevated liver function tests -trending down -? Passed stone  Hypokalemia -replete in IV -replete Mg as well  CKD stage III -monitor -follows with Justin [name]  [facility] -follows with Justin [name] yearly  Insulin dependant DM -SSI -resume insulin    Hypertension - resume home meds as able    Coronary artery disease -stable   Family Communication/Anticipated D/C date and plan/Code Status   DVT prophylaxis: scd Code Status: Full Code.  Family Communication:  Disposition Plan: gastric emptying study   Medical Consultants:   GI (Justin [name])    Subjective:   Nausea this AM  Objective:    Vitals:   10/13/18 2312 10/14/18 0535 10/14/18 0755 10/14/18 1129  BP: (!) 139/59 106/81 (!) 153/63 (!) 132/57  Pulse: 64 64 68 (!) 54  Resp: 18 18 18 18   Temp: 99 F (37.2 C) 99.6  F (37.6 C) 99.1 F (37.3 C) 98.2 F (36.8 C)  TempSrc: Oral Oral Oral Oral  SpO2: 99% 99% 97% 98%  Weight:      Height:        Intake/Output Summary (Last 24 hours) at 10/14/2018 1317 Last data filed at 10/14/2018 1040 Gross per 24 hour  Intake 2047.76 ml  Output 1125 ml  Net 922.76 ml   Filed Weights   10/13/18 0031  Weight: 82.6 kg    Exam: In bed, NAD rrr +BS, tender to palpation of upper portion of abd L>R No LE edema   Data Reviewed:   I have personally reviewed following labs and imaging studies:  Labs: Labs show the following:   Basic Metabolic Panel: Recent Labs  Lab 10/12/18 1513 10/13/18 0412 10/14/18 0811  NA 142 137 141  K 3.8 3.1* 3.3*  CL 109 109 113*  CO2 22 19* 19*  GLUCOSE 205* 200* 117*  BUN 26* 27* 19  CREATININE 1.62* 1.70* 1.70*  CALCIUM 9.6 8.9 8.9  MG  --  1.7  --    GFR Estimated Creatinine Clearance: 34.2 mL/min (A) (by C-G formula based on SCr of 1.7 mg/dL (H)). Liver Function Tests: Recent Labs  Lab 10/12/18 1513 10/13/18 0412 10/14/18 0811  AST 662* 403* 141*  ALT 313* 380* 219*  ALKPHOS 213* 211* 181*  BILITOT 1.8* 4.0* 2.6*  PROT 7.5 6.2* 5.9*  ALBUMIN 3.4* 2.7* 2.5*   Recent Labs  Lab 10/12/18 1636 10/13/18 0347  LIPASE 65* 49   No results for input(s): AMMONIA in the last 168 hours. Coagulation profile No results for input(s): INR, PROTIME in the last 168 hours.  CBC: Recent Labs  Lab 10/12/18 1513 10/13/18 0412 10/14/18 0811  WBC 13.3* 17.5* 10.5  HGB 12.8* 11.5* 11.5*  HCT 40.6 34.0* 34.8*  MCV 85.7 83.5 84.7  PLT 182 177 164   Cardiac Enzymes: No results for input(s): CKTOTAL, CKMB, CKMBINDEX, TROPONINI in the last 168 hours. BNP (last 3 results) No results for input(s): PROBNP in the last 8760 hours. CBG: Recent Labs  Lab 10/13/18 1631 10/13/18 2123 10/14/18 0644 10/14/18 0747 10/14/18 1134  GLUCAP 147* 195* 96 93 123*   D-Dimer: No results for input(s): DDIMER in the last 72  hours. Hgb A1c: No results for input(s): HGBA1C in the last 72 hours. Lipid Profile: No results for input(s): CHOL, HDL, LDLCALC, TRIG, CHOLHDL, LDLDIRECT in the last 72 hours. Thyroid function studies: No results for input(s): TSH, T4TOTAL, T3FREE, THYROIDAB in the last 72 hours.  Invalid input(s): FREET3 Anemia work up: No results for input(s): VITAMINB12, FOLATE, FERRITIN, TIBC, IRON, RETICCTPCT in the last 72 hours. Sepsis Labs: Recent Labs  Lab 10/12/18 1513 10/13/18 0412 10/14/18 0811  WBC 13.3* 17.5* 10.5    Microbiology No results found for this or any previous visit (from the past 240 hour(s)).  Procedures and diagnostic studies:  Ct Abdomen Pelvis W Contrast  Result Date: 10/12/2018 CLINICAL DATA:  Abdominal pain with dark stools EXAM: CT ABDOMEN AND PELVIS WITH CONTRAST TECHNIQUE: Multidetector CT imaging of the abdomen and pelvis was performed using the standard protocol following bolus administration of intravenous contrast. CONTRAST:  11mL OMNIPAQUE 300 COMPARISON:  None. FINDINGS: Lower chest: No acute abnormality. Hepatobiliary: No focal liver abnormality is seen. Status post cholecystectomy. No biliary dilatation. Pancreas: Unremarkable. No pancreatic ductal dilatation or surrounding inflammatory changes. Spleen: Normal in size without focal abnormality. Adrenals/Urinary Tract: Adrenal glands are unremarkable. Kidneys are normal, without renal calculi, focal lesion, or hydronephrosis. Bladder is unremarkable. Stomach/Bowel: Postsurgical changes are noted within the sigmoid colon. Diverticular change is noted without evidence of diverticulitis. The appendix is not well visualized. No inflammatory changes to suggest appendicitis are noted. Sliding-type hiatal hernia is noted. Stomach is filled with fluid without definitive obstructive change. Small metallic foreign body is noted within the stomach of uncertain chronicity. No small bowel obstructive changes noted.  Vascular/Lymphatic: Aortic atherosclerosis. No enlarged abdominal or pelvic lymph nodes. Reproductive: Status post prostatectomy. Other: No abdominal wall hernia or abnormality. No abdominopelvic ascites. Musculoskeletal: Degenerative changes of the lumbar spine are noted. IMPRESSION: Fluid-filled stomach with evidence of sliding-type hiatal hernia. Small metallic foreign body is noted within the midportion of the gastric lumen of uncertain chronicity. Correlate with any recent ingestion or procedure. Diverticulosis without diverticulitis. No acute abnormality is noted. Electronically Signed   By: Inez Catalina M.D.   On: 10/12/2018 17:56   Mr 3d Recon At Scanner  Result Date: 10/13/2018 CLINICAL DATA:  Chronic abdominal pain, recently worsening after eating. Nausea and vomiting without diarrhea. Newly elevated liver function studies. History of diabetes. EXAM: MRI ABDOMEN WITHOUT AND WITH CONTRAST (INCLUDING MRCP) TECHNIQUE: Multiplanar multisequence MR imaging of the abdomen was performed both before and after the administration of intravenous contrast. Heavily T2-weighted images of the biliary and pancreatic ducts were obtained, and three-dimensional MRCP images were rendered by post processing. CONTRAST:  8 cc Gadavist COMPARISON:  Abdominal CT and ultrasound 10/12/2018. FINDINGS: Despite efforts by the technologist and  patient, mild motion artifact is present on today's exam and could not be eliminated. This reduces exam sensitivity and specificity. The motion is greatest towards the end of the study, including the postcontrast images. Lower chest: Small hiatal hernia and previous median sternotomy. The visualized lower chest otherwise appears unremarkable. Hepatobiliary: No significant hepatic steatosis. Transient area of subcapsular enhancement laterally in the right hepatic lobe on the early postcontrast images (image 61/1301) is likely an incidental transient vascular phenomenon. No suspicious focal  hepatic findings. Previous cholecystectomy. The common hepatic duct measures 9 mm in diameter. No evidence of choledocholithiasis. Pancreas: Unremarkable. No pancreatic ductal dilatation or surrounding inflammatory changes. No evidence of pancreas divisum. Spleen: Normal in size without focal abnormality. Adrenals/Urinary Tract: Both adrenal glands appear normal. Mild renal cortical thinning bilaterally. No evidence of renal mass or hydronephrosis. Stomach/Bowel: No evidence of bowel wall thickening, distention or surrounding inflammatory change.Prominent artifact in the distal stomach attributed to metallic foreign body demonstrated on previous CT. Vascular/Lymphatic: There are no enlarged abdominal lymph nodes. No acute vascular findings. Mild aortic and branch vessel atherosclerosis, better seen on CT. Other: No ascites. Musculoskeletal: No acute or significant osseous findings. IMPRESSION: 1. No acute findings or explanation for the patient's symptoms. 2. No significant biliary dilatation post cholecystectomy. No evidence of choledocholithiasis. Detail limited by breathing artifact. 3. Artifact related to foreign body in the stomach, likely postsurgical based on provided history. Electronically Signed   By: Richardean Sale M.D.   On: 10/13/2018 16:12   US Abdomen Limited  Result Date: 10/12/2018 CLINICAL DATA:  Upper abdominal pain EXAM: ULTRASOUND ABDOMEN LIMITED RIGHT UPPER QUADRANT COMPARISON:  CT abdomen and pelvis 10/12/2018 FINDINGS: Gallbladder: Surgically absent Common bile duct: Diameter: 9 mm diameter, may be normal for patient of this age post cholecystectomy. Liver: Normal parenchymal echogenicity without mass or nodularity. No intrahepatic biliary dilatation. Portal vein is patent on color Doppler imaging with normal direction of blood flow towards the liver. No RIGHT upper quadrant free fluid. IMPRESSION: Post cholecystectomy. 9 mm CBD which may be normal for a patient of this age post  cholecystectomy, recommend correlation with LFTs. No focal hepatic sonographic abnormalities. Electronically Signed   By: Lavonia Dana M.D.   On: 10/12/2018 19:31   Mr Abdomen Mrcp Moise Boring Contast  Result Date: 10/13/2018 CLINICAL DATA:  Chronic abdominal pain, recently worsening after eating. Nausea and vomiting without diarrhea. Newly elevated liver function studies. History of diabetes. EXAM: MRI ABDOMEN WITHOUT AND WITH CONTRAST (INCLUDING MRCP) TECHNIQUE: Multiplanar multisequence MR imaging of the abdomen was performed both before and after the administration of intravenous contrast. Heavily T2-weighted images of the biliary and pancreatic ducts were obtained, and three-dimensional MRCP images were rendered by post processing. CONTRAST:  8 cc Gadavist COMPARISON:  Abdominal CT and ultrasound 10/12/2018. FINDINGS: Despite efforts by the technologist and patient, mild motion artifact is present on today's exam and could not be eliminated. This reduces exam sensitivity and specificity. The motion is greatest towards the end of the study, including the postcontrast images. Lower chest: Small hiatal hernia and previous median sternotomy. The visualized lower chest otherwise appears unremarkable. Hepatobiliary: No significant hepatic steatosis. Transient area of subcapsular enhancement laterally in the right hepatic lobe on the early postcontrast images (image 61/1301) is likely an incidental transient vascular phenomenon. No suspicious focal hepatic findings. Previous cholecystectomy. The common hepatic duct measures 9 mm in diameter. No evidence of choledocholithiasis. Pancreas: Unremarkable. No pancreatic ductal dilatation or surrounding inflammatory changes. No evidence of pancreas divisum.  Spleen: Normal in size without focal abnormality. Adrenals/Urinary Tract: Both adrenal glands appear normal. Mild renal cortical thinning bilaterally. No evidence of renal mass or hydronephrosis. Stomach/Bowel: No evidence of  bowel wall thickening, distention or surrounding inflammatory change.Prominent artifact in the distal stomach attributed to metallic foreign body demonstrated on previous CT. Vascular/Lymphatic: There are no enlarged abdominal lymph nodes. No acute vascular findings. Mild aortic and branch vessel atherosclerosis, better seen on CT. Other: No ascites. Musculoskeletal: No acute or significant osseous findings. IMPRESSION: 1. No acute findings or explanation for the patient's symptoms. 2. No significant biliary dilatation post cholecystectomy. No evidence of choledocholithiasis. Detail limited by breathing artifact. 3. Artifact related to foreign body in the stomach, likely postsurgical based on provided history. Electronically Signed   By: Richardean Sale M.D.   On: 10/13/2018 16:12    Medications:   . aspirin EC  81 mg Oral Daily  . insulin aspart  0-5 Units Subcutaneous QHS  . insulin aspart  0-9 Units Subcutaneous TID WC  . insulin glargine  37 Units Subcutaneous BID  . isosorbide mononitrate  60 mg Oral Daily  . metoprolol tartrate  50 mg Oral BID  . tamsulosin  0.4 mg Oral Daily   Continuous Infusions: . 0.9 % NaCl with KCl 20 mEq / L 75 mL/hr at 10/14/18 0604  . piperacillin-tazobactam (ZOSYN)  IV 3.375 g (10/14/18 0605)     LOS: 0 days   Geradine Girt  [facility]   *Please refer to [email or web address], password [email or web address] to get updated schedule on who will round on this patient, as hospitalists switch teams weekly. If 7PM-7AM, please contact night-coverage at [email or web address], password [email or web address] for any overnight needs.  10/14/2018, 1:17 PM

## 2018-10-15 ENCOUNTER — Observation Stay (HOSPITAL_COMMUNITY): Payer: Medicare PPO

## 2018-10-15 DIAGNOSIS — Z794 Long term (current) use of insulin: Secondary | ICD-10-CM | POA: Diagnosis not present

## 2018-10-15 DIAGNOSIS — R945 Abnormal results of liver function studies: Secondary | ICD-10-CM | POA: Diagnosis not present

## 2018-10-15 DIAGNOSIS — I10 Essential (primary) hypertension: Secondary | ICD-10-CM | POA: Diagnosis not present

## 2018-10-15 DIAGNOSIS — R1084 Generalized abdominal pain: Secondary | ICD-10-CM | POA: Diagnosis not present

## 2018-10-15 DIAGNOSIS — E119 Type 2 diabetes mellitus without complications: Secondary | ICD-10-CM

## 2018-10-15 DIAGNOSIS — E1169 Type 2 diabetes mellitus with other specified complication: Secondary | ICD-10-CM | POA: Diagnosis not present

## 2018-10-15 DIAGNOSIS — R748 Abnormal levels of other serum enzymes: Secondary | ICD-10-CM | POA: Diagnosis not present

## 2018-10-15 DIAGNOSIS — R112 Nausea with vomiting, unspecified: Secondary | ICD-10-CM | POA: Diagnosis not present

## 2018-10-15 LAB — CBC
HCT: 34.6 % — ABNORMAL LOW (ref 39.0–52.0)
Hemoglobin: 11 g/dL — ABNORMAL LOW (ref 13.0–17.0)
MCH: 27.2 pg (ref 26.0–34.0)
MCHC: 31.8 g/dL (ref 30.0–36.0)
MCV: 85.6 fL (ref 80.0–100.0)
Platelets: 158 10*3/uL (ref 150–400)
RBC: 4.04 MIL/uL — ABNORMAL LOW (ref 4.22–5.81)
RDW: 15.5 % (ref 11.5–15.5)
WBC: 8.8 10*3/uL (ref 4.0–10.5)
nRBC: 0 % (ref 0.0–0.2)

## 2018-10-15 LAB — COMPREHENSIVE METABOLIC PANEL
ALT: 137 U/L — ABNORMAL HIGH (ref 0–44)
AST: 62 U/L — ABNORMAL HIGH (ref 15–41)
Albumin: 2.4 g/dL — ABNORMAL LOW (ref 3.5–5.0)
Alkaline Phosphatase: 148 U/L — ABNORMAL HIGH (ref 38–126)
Anion gap: 7 (ref 5–15)
BUN: 15 mg/dL (ref 8–23)
CO2: 16 mmol/L — ABNORMAL LOW (ref 22–32)
Calcium: 8.2 mg/dL — ABNORMAL LOW (ref 8.9–10.3)
Chloride: 120 mmol/L — ABNORMAL HIGH (ref 98–111)
Creatinine, Ser: 1.39 mg/dL — ABNORMAL HIGH (ref 0.61–1.24)
GFR calc Af Amer: 54 mL/min — ABNORMAL LOW (ref >60)
GFR calc non Af Amer: 47 mL/min — ABNORMAL LOW (ref >60)
Glucose, Bld: 101 mg/dL — ABNORMAL HIGH (ref 70–99)
Potassium: 5.6 mmol/L — ABNORMAL HIGH (ref 3.5–5.1)
Sodium: 143 mmol/L (ref 135–145)
Total Bilirubin: 1 mg/dL (ref 0.3–1.2)
Total Protein: 5.7 g/dL — ABNORMAL LOW (ref 6.5–8.1)

## 2018-10-15 LAB — GLUCOSE, CAPILLARY
Glucose-Capillary: 97 mg/dL (ref 70–99)
Glucose-Capillary: 96 mg/dL (ref 70–99)
Glucose-Capillary: 103 mg/dL — ABNORMAL HIGH (ref 70–99)

## 2018-10-15 MED ORDER — TECHNETIUM TC 99M SULFUR COLLOID
2.0000 | Freq: Once | INTRAVENOUS | Status: AC | PRN
  Administered 2018-10-15: 2 via ORAL

## 2018-10-15 NOTE — Discharge Summary (Signed)
Physician Discharge Summary  Justin [name] AGT:[id number] DOB: November 19, 1932 DOA: 10/12/2018  PCP: [name], MD  Admit date: 10/12/2018 Discharge date: 10/15/2018  Admitted From: home Discharge disposition: home   Recommendations for Outpatient Follow-Up:   1. GI follow up 2. Home health   Discharge Diagnosis:   Principal Problem:   Abdominal pain Active Problems:   Hypertension   GERD (gastroesophageal reflux disease)   Coronary artery disease   Abnormal liver function tests   Foreign body in stomach    Discharge Condition: Improved.  Diet recommendation: Low sodium, heart healthy.  Carbohydrate-modified  Wound care: None.  Code status: Full.   History of Present Illness:   Justin [name] is a 85 y.o. male with medical history significant of chronic abdominal pain being worked up by dr hung as outpt with recent egd and colonscopy several polyps removed and also something removed in stomach that was clipped (do not have records available ) comes in with worsening abdominal pain that is periumbilical across mid abd worse with eating.  With nausea and vomiting no diarrhea.  No fevers.  No melena or brbpr but stool has been darker than usual.  Pt  Has had normal liver function tests in the past and tonight he has elevated of his lfts, alk phos and tbili which is changed from the last month during his outpt work up.  Dr hung called and advised mrcp.   Hospital Course by Problem:   Abdominal pain -GI consulted (hung) -MRCP negative U/S shows dilated CBD -GB removal in Jan 2019 -heme occult negative -gastric emptying study normal -? IBS as pain better after bowel movement -pain not like prior diverticulitis episodes  Elevated liver function tests -trending down -? Passed stone  Hypokalemia -repleted  CKD stage III -monitor -follows with Dr. Posey Pronto  MGUS -follows with Dr. Irene Limbo yearly  Insulin dependant DM -resume home  meds  Hypertension - resume home meds  Coronary artery disease -stable     Medical Consultants:   GI   Discharge Exam:   Vitals:   10/15/18 0413 10/15/18 1300  BP: (!) 148/60 (!) 146/59  Pulse: (!) 59 60  Resp:    Temp: 98.6 F (37 C) 98.2 F (36.8 C)  SpO2: 98% 98%   Vitals:   10/14/18 2133 10/14/18 2142 10/15/18 0413 10/15/18 1300  BP: (!) 132/52 (!) 141/56 (!) 148/60 (!) 146/59  Pulse: (!) 51 (!) 59 (!) 59 60  Resp:      Temp:  98.3 F (36.8 C) 98.6 F (37 C) 98.2 F (36.8 C)  TempSrc:  Oral Oral Oral  SpO2:  95% 98% 98%  Weight:      Height:        General exam: Appears calm and comfortable-- appetite good and pain resolved  The results of significant diagnostics from this hospitalization (including imaging, microbiology, ancillary and laboratory) are listed below for reference.     Procedures and Diagnostic Studies:   Ct Abdomen Pelvis W Contrast  Result Date: 10/12/2018 CLINICAL DATA:  Abdominal pain with dark stools EXAM: CT ABDOMEN AND PELVIS WITH CONTRAST TECHNIQUE: Multidetector CT imaging of the abdomen and pelvis was performed using the standard protocol following bolus administration of intravenous contrast. CONTRAST:  120m OMNIPAQUE 300 COMPARISON:  None. FINDINGS: Lower chest: No acute abnormality. Hepatobiliary: No focal liver abnormality is seen. Status post cholecystectomy. No biliary dilatation. Pancreas: Unremarkable. No pancreatic ductal dilatation or surrounding inflammatory changes. Spleen: Normal in size without focal abnormality.  Adrenals/Urinary Tract: Adrenal glands are unremarkable. Kidneys are normal, without renal calculi, focal lesion, or hydronephrosis. Bladder is unremarkable. Stomach/Bowel: Postsurgical changes are noted within the sigmoid colon. Diverticular change is noted without evidence of diverticulitis. The appendix is not well visualized. No inflammatory changes to suggest appendicitis are noted. Sliding-type hiatal  hernia is noted. Stomach is filled with fluid without definitive obstructive change. Small metallic foreign body is noted within the stomach of uncertain chronicity. No small bowel obstructive changes noted. Vascular/Lymphatic: Aortic atherosclerosis. No enlarged abdominal or pelvic lymph nodes. Reproductive: Status post prostatectomy. Other: No abdominal wall hernia or abnormality. No abdominopelvic ascites. Musculoskeletal: Degenerative changes of the lumbar spine are noted. IMPRESSION: Fluid-filled stomach with evidence of sliding-type hiatal hernia. Small metallic foreign body is noted within the midportion of the gastric lumen of uncertain chronicity. Correlate with any recent ingestion or procedure. Diverticulosis without diverticulitis. No acute abnormality is noted. Electronically Signed   By: Inez Catalina M.D.   On: 10/12/2018 17:56   Mr 3d Recon At Scanner  Result Date: 10/13/2018 CLINICAL DATA:  Chronic abdominal pain, recently worsening after eating. Nausea and vomiting without diarrhea. Newly elevated liver function studies. History of diabetes. EXAM: MRI ABDOMEN WITHOUT AND WITH CONTRAST (INCLUDING MRCP) TECHNIQUE: Multiplanar multisequence MR imaging of the abdomen was performed both before and after the administration of intravenous contrast. Heavily T2-weighted images of the biliary and pancreatic ducts were obtained, and three-dimensional MRCP images were rendered by post processing. CONTRAST:  8 cc Gadavist COMPARISON:  Abdominal CT and ultrasound 10/12/2018. FINDINGS: Despite efforts by the technologist and patient, mild motion artifact is present on today's exam and could not be eliminated. This reduces exam sensitivity and specificity. The motion is greatest towards the end of the study, including the postcontrast images. Lower chest: Small hiatal hernia and previous median sternotomy. The visualized lower chest otherwise appears unremarkable. Hepatobiliary: No significant hepatic  steatosis. Transient area of subcapsular enhancement laterally in the right hepatic lobe on the early postcontrast images (image 61/1301) is likely an incidental transient vascular phenomenon. No suspicious focal hepatic findings. Previous cholecystectomy. The common hepatic duct measures 9 mm in diameter. No evidence of choledocholithiasis. Pancreas: Unremarkable. No pancreatic ductal dilatation or surrounding inflammatory changes. No evidence of pancreas divisum. Spleen: Normal in size without focal abnormality. Adrenals/Urinary Tract: Both adrenal glands appear normal. Mild renal cortical thinning bilaterally. No evidence of renal mass or hydronephrosis. Stomach/Bowel: No evidence of bowel wall thickening, distention or surrounding inflammatory change.Prominent artifact in the distal stomach attributed to metallic foreign body demonstrated on previous CT. Vascular/Lymphatic: There are no enlarged abdominal lymph nodes. No acute vascular findings. Mild aortic and branch vessel atherosclerosis, better seen on CT. Other: No ascites. Musculoskeletal: No acute or significant osseous findings. IMPRESSION: 1. No acute findings or explanation for the patient's symptoms. 2. No significant biliary dilatation post cholecystectomy. No evidence of choledocholithiasis. Detail limited by breathing artifact. 3. Artifact related to foreign body in the stomach, likely postsurgical based on provided history. Electronically Signed   By: Richardean Sale M.D.   On: 10/13/2018 16:12   US Abdomen Limited  Result Date: 10/12/2018 CLINICAL DATA:  Upper abdominal pain EXAM: ULTRASOUND ABDOMEN LIMITED RIGHT UPPER QUADRANT COMPARISON:  CT abdomen and pelvis 10/12/2018 FINDINGS: Gallbladder: Surgically absent Common bile duct: Diameter: 9 mm diameter, may be normal for patient of this age post cholecystectomy. Liver: Normal parenchymal echogenicity without mass or nodularity. No intrahepatic biliary dilatation. Portal vein is patent on  color Doppler imaging with normal  direction of blood flow towards the liver. No RIGHT upper quadrant free fluid. IMPRESSION: Post cholecystectomy. 9 mm CBD which may be normal for a patient of this age post cholecystectomy, recommend correlation with LFTs. No focal hepatic sonographic abnormalities. Electronically Signed   By: Lavonia Dana M.D.   On: 10/12/2018 19:31   Mr Abdomen Mrcp Moise Boring Contast  Result Date: 10/13/2018 CLINICAL DATA:  Chronic abdominal pain, recently worsening after eating. Nausea and vomiting without diarrhea. Newly elevated liver function studies. History of diabetes. EXAM: MRI ABDOMEN WITHOUT AND WITH CONTRAST (INCLUDING MRCP) TECHNIQUE: Multiplanar multisequence MR imaging of the abdomen was performed both before and after the administration of intravenous contrast. Heavily T2-weighted images of the biliary and pancreatic ducts were obtained, and three-dimensional MRCP images were rendered by post processing. CONTRAST:  8 cc Gadavist COMPARISON:  Abdominal CT and ultrasound 10/12/2018. FINDINGS: Despite efforts by the technologist and patient, mild motion artifact is present on today's exam and could not be eliminated. This reduces exam sensitivity and specificity. The motion is greatest towards the end of the study, including the postcontrast images. Lower chest: Small hiatal hernia and previous median sternotomy. The visualized lower chest otherwise appears unremarkable. Hepatobiliary: No significant hepatic steatosis. Transient area of subcapsular enhancement laterally in the right hepatic lobe on the early postcontrast images (image 61/1301) is likely an incidental transient vascular phenomenon. No suspicious focal hepatic findings. Previous cholecystectomy. The common hepatic duct measures 9 mm in diameter. No evidence of choledocholithiasis. Pancreas: Unremarkable. No pancreatic ductal dilatation or surrounding inflammatory changes. No evidence of pancreas divisum. Spleen: Normal in  size without focal abnormality. Adrenals/Urinary Tract: Both adrenal glands appear normal. Mild renal cortical thinning bilaterally. No evidence of renal mass or hydronephrosis. Stomach/Bowel: No evidence of bowel wall thickening, distention or surrounding inflammatory change.Prominent artifact in the distal stomach attributed to metallic foreign body demonstrated on previous CT. Vascular/Lymphatic: There are no enlarged abdominal lymph nodes. No acute vascular findings. Mild aortic and branch vessel atherosclerosis, better seen on CT. Other: No ascites. Musculoskeletal: No acute or significant osseous findings. IMPRESSION: 1. No acute findings or explanation for the patient's symptoms. 2. No significant biliary dilatation post cholecystectomy. No evidence of choledocholithiasis. Detail limited by breathing artifact. 3. Artifact related to foreign body in the stomach, likely postsurgical based on provided history. Electronically Signed   By: Richardean Sale M.D.   On: 10/13/2018 16:12     Labs:   Basic Metabolic Panel: Recent Labs  Lab 10/12/18 1513 10/13/18 0412 10/14/18 0811 10/15/18 0719  NA 142 137 141 143  K 3.8 3.1* 3.3* 5.6*  CL 109 109 113* 120*  CO2 22 19* 19* 16*  GLUCOSE 205* 200* 117* 101*  BUN 26* 27* 19 15  CREATININE 1.62* 1.70* 1.70* 1.39*  CALCIUM 9.6 8.9 8.9 8.2*  MG  --  1.7  --   --    GFR Estimated Creatinine Clearance: 41.8 mL/min (A) (by C-G formula based on SCr of 1.39 mg/dL (H)). Liver Function Tests: Recent Labs  Lab 10/12/18 1513 10/13/18 0412 10/14/18 0811 10/15/18 0719  AST 662* 403* 141* 62*  ALT 313* 380* 219* 137*  ALKPHOS 213* 211* 181* 148*  BILITOT 1.8* 4.0* 2.6* 1.0  PROT 7.5 6.2* 5.9* 5.7*  ALBUMIN 3.4* 2.7* 2.5* 2.4*   Recent Labs  Lab 10/12/18 1636 10/13/18 0412  LIPASE 65* 49   No results for input(s): AMMONIA in the last 168 hours. Coagulation profile No results for input(s): INR, PROTIME in the  last 168 hours.  CBC: Recent  Labs  Lab 10/12/18 1513 10/13/18 0412 10/14/18 0811 10/15/18 0719  WBC 13.3* 17.5* 10.5 8.8  HGB 12.8* 11.5* 11.5* 11.0*  HCT 40.6 34.0* 34.8* 34.6*  MCV 85.7 83.5 84.7 85.6  PLT 182 177 164 158   Cardiac Enzymes: No results for input(s): CKTOTAL, CKMB, CKMBINDEX, TROPONINI in the last 168 hours. BNP: Invalid input(s): POCBNP CBG: Recent Labs  Lab 10/14/18 1628 10/14/18 2141 10/15/18 0411 10/15/18 0717 10/15/18 1143  GLUCAP 135* 128* 97 96 103*   D-Dimer No results for input(s): DDIMER in the last 72 hours. Hgb A1c No results for input(s): HGBA1C in the last 72 hours. Lipid Profile No results for input(s): CHOL, HDL, LDLCALC, TRIG, CHOLHDL, LDLDIRECT in the last 72 hours. Thyroid function studies No results for input(s): TSH, T4TOTAL, T3FREE, THYROIDAB in the last 72 hours.  Invalid input(s): FREET3 Anemia work up No results for input(s): VITAMINB12, FOLATE, FERRITIN, TIBC, IRON, RETICCTPCT in the last 72 hours. Microbiology No results found for this or any previous visit (from the past 240 hour(s)).   Discharge Instructions:   Discharge Instructions    Diet - low sodium heart healthy   Complete by:  As directed    Diet Carb Modified   Complete by:  As directed    Discharge instructions   Complete by:  As directed    Hold your crestor until seen by your PCP and your liver enzymes are checked See attached information about IBS diet Home health PT Your gastric emptying study was normal so this leans away from diabetic gastroparesis causing your symptoms   Increase activity slowly   Complete by:  As directed      Allergies as of 10/15/2018      Reactions   Dye Fdc Red [red Dye] Swelling, Other (See Comments)   CAT scan dye   Ibuprofen Other (See Comments)   Upset stomach       Medication List    STOP taking these medications   chlorthalidone 25 MG tablet Commonly known as:  HYGROTON   predniSONE 20 MG tablet Commonly known as:  DELTASONE    rosuvastatin 20 MG tablet Commonly known as:  CRESTOR   sucralfate 1 g tablet Commonly known as:  CARAFATE   SUPREP BOWEL PREP KIT 17.5-3.13-1.6 GM/177ML Soln Generic drug:  Na Sulfate-K Sulfate-Mg Sulf     TAKE these medications   AMITIZA 24 MCG capsule Generic drug:  lubiprostone Take 24 mcg by mouth daily as needed for constipation.   aspirin EC 81 MG tablet Take 81 mg by mouth daily.   clonazePAM 0.5 MG tablet Commonly known as:  KLONOPIN Take 0.5 mg by mouth daily as needed for anxiety.   DULoxetine HCl 40 MG Csdr Take 1 capsule by mouth 2 (two) times daily.   isosorbide mononitrate 60 MG 24 hr tablet Commonly known as:  IMDUR Take 60 mg by mouth daily.   JANUVIA 100 MG tablet Generic drug:  sitaGLIPtin Take 100 mg by mouth daily.   LANTUS SOLOSTAR 100 UNIT/ML Solostar Pen Generic drug:  Insulin Glargine Inject 37 Units into the skin 2 (two) times daily.   metoprolol tartrate 50 MG tablet Commonly known as:  LOPRESSOR Take 50 mg by mouth 2 (two) times daily.   nitroGLYCERIN 0.4 MG SL tablet Commonly known as:  NITROSTAT Place 0.4 mg under the tongue every 5 (five) minutes as needed for chest pain.   tamsulosin 0.4 MG Caps capsule Commonly known as:  FLOMAX Take 0.4 mg by mouth daily.   VITAMIN B-6 PO Take 1 tablet by mouth daily.            Durable Medical Equipment  (From admission, onward)         Start     Ordered   10/15/18 1330  For home use only DME Walker rolling  Once    Question:  Patient needs a walker to treat with the following condition  Answer:  Physical deconditioning   10/15/18 [place] Follow up.   Why:  [place] information: [address] [place] [address] [phone]        [facility] Follow up.   Specialty:  [place] Why:  Home Health Physical Therapy Contact information: [address] [place] [address] [phone]        [name], MD Follow up in 1 week(s).   Specialty:  Internal Medicine Contact information: [place] [address] [phone]        [name], MD Follow up.   Specialty:  Gastroenterology Contact information: [address] [place] [address] [phone]            Time coordinating discharge: 25 min  Signed:  Geradine Girt DO  [facility] 10/15/2018, 2:18 PM

## 2018-10-15 NOTE — Care Management Note (Signed)
Case Management Note  Patient Details  Name: Justin [name] MRN: [id number] Date of Birth: 11-Nov-1932  Subjective/Objective:   84 yo male presented with abdominal pain.                 Action/Plan: CM met with patient/family to discuss transitional needs. Patient lives at home with his daughter/granddaughter and was independent with his ADLs PTA; patient has a SPC to assist during ambulation. PCP verified as: [name]; pharmacy of choice: [facility]. PT eval complete with HHPT/RW recommended with patient agreeable. Patient's daughter will be available to assist 24hr/day post transition. CMS HH compare list provided with [facility] selected for HH/DME. [place] referral given to Adonis Brook, [facility] liaison; RW referral given to [place], [facility] liaison; AVS updated. Patients family will provide transportation home. No further needs from CM.    Expected Discharge Date:  10/14/18               Expected Discharge Plan:  [place]  In-House Referral:  NA  Discharge planning Services  CM Consult  Post Acute Care Choice:  Durable Medical Equipment, Home Health Choice offered to:  Patient  DME Arranged:  Walker rolling DME Agency:  [place]:  PT [facility] Agency:  [place]  Status of Service:  Completed, signed off  If discussed at [place] of Stay Meetings, dates discussed:    Additional Comments:  Midge Minium RN, BSN, NCM-BC, ACM-RN [phone] 10/15/2018, 1:46 PM

## 2018-10-18 DIAGNOSIS — I2510 Atherosclerotic heart disease of native coronary artery without angina pectoris: Secondary | ICD-10-CM | POA: Diagnosis not present

## 2018-10-18 DIAGNOSIS — K922 Gastrointestinal hemorrhage, unspecified: Secondary | ICD-10-CM | POA: Diagnosis not present

## 2018-10-18 DIAGNOSIS — I129 Hypertensive chronic kidney disease with stage 1 through stage 4 chronic kidney disease, or unspecified chronic kidney disease: Secondary | ICD-10-CM | POA: Diagnosis not present

## 2018-10-18 DIAGNOSIS — R7989 Other specified abnormal findings of blood chemistry: Secondary | ICD-10-CM | POA: Diagnosis not present

## 2018-10-18 DIAGNOSIS — K5730 Diverticulosis of large intestine without perforation or abscess without bleeding: Secondary | ICD-10-CM | POA: Diagnosis not present

## 2018-10-18 DIAGNOSIS — N183 Chronic kidney disease, stage 3 (moderate): Secondary | ICD-10-CM | POA: Diagnosis not present

## 2018-10-18 DIAGNOSIS — D649 Anemia, unspecified: Secondary | ICD-10-CM | POA: Diagnosis not present

## 2018-10-18 DIAGNOSIS — E1122 Type 2 diabetes mellitus with diabetic chronic kidney disease: Secondary | ICD-10-CM | POA: Diagnosis not present

## 2018-10-18 DIAGNOSIS — D472 Monoclonal gammopathy: Secondary | ICD-10-CM | POA: Diagnosis not present

## 2018-10-19 DIAGNOSIS — D509 Iron deficiency anemia, unspecified: Secondary | ICD-10-CM | POA: Diagnosis not present

## 2018-10-19 DIAGNOSIS — R1013 Epigastric pain: Secondary | ICD-10-CM | POA: Diagnosis not present

## 2018-10-19 DIAGNOSIS — N400 Enlarged prostate without lower urinary tract symptoms: Secondary | ICD-10-CM | POA: Diagnosis not present

## 2018-10-19 DIAGNOSIS — I10 Essential (primary) hypertension: Secondary | ICD-10-CM | POA: Diagnosis not present

## 2018-10-19 DIAGNOSIS — N183 Chronic kidney disease, stage 3 (moderate): Secondary | ICD-10-CM | POA: Diagnosis not present

## 2018-10-19 DIAGNOSIS — F419 Anxiety disorder, unspecified: Secondary | ICD-10-CM | POA: Diagnosis not present

## 2018-10-19 DIAGNOSIS — I2510 Atherosclerotic heart disease of native coronary artery without angina pectoris: Secondary | ICD-10-CM | POA: Diagnosis not present

## 2018-10-19 DIAGNOSIS — K591 Functional diarrhea: Secondary | ICD-10-CM | POA: Diagnosis not present

## 2018-10-19 DIAGNOSIS — E118 Type 2 diabetes mellitus with unspecified complications: Secondary | ICD-10-CM | POA: Diagnosis not present

## 2018-10-20 DIAGNOSIS — R05 Cough: Secondary | ICD-10-CM | POA: Diagnosis not present

## 2018-10-20 DIAGNOSIS — I2510 Atherosclerotic heart disease of native coronary artery without angina pectoris: Secondary | ICD-10-CM | POA: Diagnosis not present

## 2018-10-20 DIAGNOSIS — R1310 Dysphagia, unspecified: Secondary | ICD-10-CM | POA: Diagnosis not present

## 2018-10-20 DIAGNOSIS — R740 Nonspecific elevation of levels of transaminase and lactic acid dehydrogenase [LDH]: Secondary | ICD-10-CM | POA: Diagnosis not present

## 2018-10-20 DIAGNOSIS — K219 Gastro-esophageal reflux disease without esophagitis: Secondary | ICD-10-CM | POA: Diagnosis not present

## 2018-10-21 DIAGNOSIS — K5730 Diverticulosis of large intestine without perforation or abscess without bleeding: Secondary | ICD-10-CM | POA: Diagnosis not present

## 2018-10-21 DIAGNOSIS — D472 Monoclonal gammopathy: Secondary | ICD-10-CM | POA: Diagnosis not present

## 2018-10-21 DIAGNOSIS — E1122 Type 2 diabetes mellitus with diabetic chronic kidney disease: Secondary | ICD-10-CM | POA: Diagnosis not present

## 2018-10-21 DIAGNOSIS — R740 Nonspecific elevation of levels of transaminase and lactic acid dehydrogenase [LDH]: Secondary | ICD-10-CM | POA: Diagnosis not present

## 2018-10-21 DIAGNOSIS — R7989 Other specified abnormal findings of blood chemistry: Secondary | ICD-10-CM | POA: Diagnosis not present

## 2018-10-21 DIAGNOSIS — I2510 Atherosclerotic heart disease of native coronary artery without angina pectoris: Secondary | ICD-10-CM | POA: Diagnosis not present

## 2018-10-21 DIAGNOSIS — K922 Gastrointestinal hemorrhage, unspecified: Secondary | ICD-10-CM | POA: Diagnosis not present

## 2018-10-21 DIAGNOSIS — I129 Hypertensive chronic kidney disease with stage 1 through stage 4 chronic kidney disease, or unspecified chronic kidney disease: Secondary | ICD-10-CM | POA: Diagnosis not present

## 2018-10-21 DIAGNOSIS — D649 Anemia, unspecified: Secondary | ICD-10-CM | POA: Diagnosis not present

## 2018-10-21 DIAGNOSIS — N183 Chronic kidney disease, stage 3 (moderate): Secondary | ICD-10-CM | POA: Diagnosis not present

## 2018-10-23 ENCOUNTER — Other Ambulatory Visit: Payer: Self-pay | Admitting: Cardiology

## 2018-10-23 DIAGNOSIS — R0989 Other specified symptoms and signs involving the circulatory and respiratory systems: Secondary | ICD-10-CM

## 2018-10-26 DIAGNOSIS — D472 Monoclonal gammopathy: Secondary | ICD-10-CM | POA: Diagnosis not present

## 2018-10-26 DIAGNOSIS — N183 Chronic kidney disease, stage 3 (moderate): Secondary | ICD-10-CM | POA: Diagnosis not present

## 2018-10-26 DIAGNOSIS — I129 Hypertensive chronic kidney disease with stage 1 through stage 4 chronic kidney disease, or unspecified chronic kidney disease: Secondary | ICD-10-CM | POA: Diagnosis not present

## 2018-10-26 DIAGNOSIS — K922 Gastrointestinal hemorrhage, unspecified: Secondary | ICD-10-CM | POA: Diagnosis not present

## 2018-10-26 DIAGNOSIS — R7989 Other specified abnormal findings of blood chemistry: Secondary | ICD-10-CM | POA: Diagnosis not present

## 2018-10-26 DIAGNOSIS — E1122 Type 2 diabetes mellitus with diabetic chronic kidney disease: Secondary | ICD-10-CM | POA: Diagnosis not present

## 2018-10-26 DIAGNOSIS — I2510 Atherosclerotic heart disease of native coronary artery without angina pectoris: Secondary | ICD-10-CM | POA: Diagnosis not present

## 2018-10-26 DIAGNOSIS — K5730 Diverticulosis of large intestine without perforation or abscess without bleeding: Secondary | ICD-10-CM | POA: Diagnosis not present

## 2018-10-26 DIAGNOSIS — D649 Anemia, unspecified: Secondary | ICD-10-CM | POA: Diagnosis not present

## 2018-10-26 NOTE — Progress Notes (Signed)
[facility] Note  10/29/2018     CHIEF COMPLAINT Patient presents for Retina Follow Up   HISTORY OF PRESENT ILLNESS: Justin [name] is a 85 y.o. male who presents to the clinic today for:   HPI    Retina Follow Up    In both eyes.  Duration of 4 weeks.  I, the attending physician,  performed the HPI with the patient and updated documentation appropriately.          Comments    Patient states vision the same OU. BS was 86 this am. Last a1c unknown.        Last edited by [name], MD on 10/29/2018  8:38 AM. (History)      Referring physician: [name], MD [place], [place] [address]  HISTORICAL INFORMATION:   Selected notes from the MEDICAL RECORD NUMBER Referred by Dr. [name] for concern of mac edema OU LEE:  Ocular Hx-pseudo OU; formerly followed at [facility] Dr. [name]. S/p focal laser OS x2, history of IVK and IVT OU PMH-anxiety, arthritis, CAD, DM (taking lantus and januvia), HTN, high cholesterol, prostate cancer    CURRENT MEDICATIONS: No current outpatient medications on file. (Ophthalmic Drugs)   No current facility-administered medications for this visit.  (Ophthalmic Drugs)   Current Outpatient Medications (Other)  Medication Sig  . AMITIZA 24 MCG capsule Take 24 mcg by mouth daily as needed for constipation.   Marland Kitchen aspirin EC 81 MG tablet Take 81 mg by mouth daily.  . chlorthalidone (HYGROTON) 25 MG tablet Take 25 mg by mouth daily.  . cilostazol (PLETAL) 100 MG tablet Take 100 mg by mouth daily.  . clonazePAM (KLONOPIN) 0.5 MG tablet Take 0.5 mg by mouth daily as needed for anxiety.  . DULoxetine HCl 40 MG CPEP Take 1 capsule by mouth 2 (two) times daily.  . DULoxetine HCl 40 MG CSDR Take 1 capsule by mouth 2 (two) times daily.  . isosorbide mononitrate (IMDUR) 60 MG 24 hr tablet Take 60 mg by mouth daily.  Marland Kitchen JANUVIA 100 MG tablet Take 100 mg by mouth daily.   Marland Kitchen  LANTUS SOLOSTAR 100 UNIT/ML Solostar Pen Inject 37 Units into the skin 2 (two) times daily.   Marland Kitchen loperamide (IMODIUM) 2 MG capsule Take 2 mg by mouth 3 (three) times daily.  . metoprolol (LOPRESSOR) 50 MG tablet Take 50 mg by mouth 2 (two) times daily.  . nitroGLYCERIN (NITROSTAT) 0.4 MG SL tablet Place 0.4 mg under the tongue every 5 (five) minutes as needed for chest pain.   Marland Kitchen Pyridoxine HCl (VITAMIN B-6 PO) Take 1 tablet by mouth daily.  . rosuvastatin (CRESTOR) 20 MG tablet Take 20 mg by mouth daily.  . tamsulosin (FLOMAX) 0.4 MG CAPS capsule Take 0.4 mg by mouth daily.   Current Facility-Administered Medications (Other)  Medication Route  . Bevacizumab (AVASTIN) SOLN 1.25 mg Intravitreal  . Bevacizumab (AVASTIN) SOLN 1.25 mg Intravitreal      REVIEW OF SYSTEMS: ROS    Positive for: Endocrine, Eyes, Psychiatric   Negative for: Constitutional, Gastrointestinal, Neurological, Skin, Genitourinary, Musculoskeletal, HENT, Cardiovascular, Respiratory, Allergic/Imm, Heme/Lymph   Last edited by Roselee Nova D on 10/29/2018  7:52 AM. (History)       ALLERGIES Allergies  Allergen Reactions  . Dye Fdc Red [Red Dye] Swelling and Other (See Comments)    CAT scan dye  . Ibuprofen Other (See Comments)    Upset stomach  PAST MEDICAL HISTORY Past Medical History:  Diagnosis Date  . Anxiety   . Arthritis   . Coronary artery disease   . Diabetes mellitus without complication ([place])   . Diverticulitis   . Dyspnea   . GERD (gastroesophageal reflux disease)   . Hypercholesteremia   . Hypertension   . Pneumonia    as a child  . Prostate cancer ([place])   . UTI (lower urinary tract infection)    Past Surgical History:  Procedure Laterality Date  . ABDOMINAL SURGERY     for diverticulitis; also removed appendix  . CATARACT EXTRACTION    . CATARACT EXTRACTION, BILATERAL    . CHOLECYSTECTOMY N/A 10/12/2017   Procedure: LAPAROSCOPIC CHOLECYSTECTOMY;  Surgeon: [name], MD;   Location: [facility];  Service: General;  Laterality: N/A;  . CORONARY ARTERY BYPASS GRAFT  2009  . EYE SURGERY     "for bleeding in eye"  . HERNIA REPAIR    . LEFT HEART CATH AND CORONARY ANGIOGRAPHY N/A 11/04/2016   Procedure: Left Heart Cath and Coronary Angiography;  Surgeon: [name], MD;  Location: [facility];  Service: Cardiovascular;  Laterality: N/A;  . LOWER EXTREMITY ANGIOGRAPHY N/A 11/04/2016   Procedure: Lower Extremity Angiography;  Surgeon: [name], MD;  Location: [facility];  Service: Cardiovascular;  Laterality: N/A;  . PROSTATE SURGERY      FAMILY HISTORY Family History  Problem Relation Age of Onset  . Diabetes Father   . Diabetes Maternal Aunt   . Diabetes Maternal Uncle   . Diabetes Maternal Grandmother   . Heart disease Sister   . Heart disease Sister     SOCIAL HISTORY Social History   Tobacco Use  . Smoking status: Former Research scientist (life sciences)  . Smokeless tobacco: Never Used  Substance Use Topics  . Alcohol use: No  . Drug use: No         OPHTHALMIC EXAM:  Base Eye Exam    Visual Acuity (Snellen - Linear)      Right Left   Dist cc 20/40 -2 20/40 -2   Dist ph cc 20/40 +2 20/30 -1   Correction:  Glasses       Tonometry (Tonopen, 8:09 AM)      Right Left   Pressure 10 17       Pupils      Dark Light Shape React APD   Right 3 2 Round Slow None   Left 3 2 Round Slow None       Visual Fields (Counting fingers)      Left Right    Full Full       Extraocular Movement      Right Left    Full, Ortho Full, Ortho       Neuro/Psych    Oriented x3:  Yes   Mood/Affect:  Normal       Dilation    Both eyes:  1.0% Mydriacyl, 2.5% Phenylephrine @ 8:09 AM        Slit Lamp and Fundus Exam    Slit Lamp Exam      Right Left   Lids/Lashes Dermatochalasis - upper lid, Meibomian gland dysfunction Dermatochalasis - upper lid, Meibomian gland dysfunction   Conjunctiva/Sclera Melanosis Melanosis   Cornea Arcus, 1+ Punctate epithelial erosions  Arcus, 1+ Punctate epithelial erosions   Anterior Chamber Deep and quiet Deep and quiet   Iris Mild Temporal Iris atrophy, Round and dilated, No NVI Mild Temporal Iris atrophy, Round and dilated,  No NVI   Lens PC IOL in good position with mild aterior capsule fimosis PC IOL in good position with mild aterior capsule fimosis   Vitreous Vitreous syneresis, Posterior vitreous detachment Vitreous syneresis, refractile deposits in posterior hyaloid, mild Asteroid hyalosis       Fundus Exam      Right Left   Disc Pink and Sharp, Peripapillary atrophy Tilted disc, mild temporal Temporal Peripapillary atrophy   C/D Ratio 0.3 0.3   Macula Blunted foveal reflex, Retinal pigment epithelial mottling and clumping, Microaneurysms, cystic changes slightly increased Blunted foveal reflex, focal area of RPE atrophy SN to fovea, Microaneurysms, Retinal pigment epithelial mottling, refractile Epiretinal membrane   Vessels Tortuous, AV crossing changes Tortuous, Vascular attenuation   Periphery Attached, scattered MA Attached, flame heme at 1100 midzone        Refraction    Wearing Rx      Sphere Cylinder Axis Add   Right +0.25 +0.50 010 +3.00   Left [place] +0.75 173 +3.00   Type:  Bifocal       Manifest Refraction      Sphere Cylinder Axis Dist VA   Right +0.25 +0.50 012 20/40   Left [place] +1.00 175 20/25-2          IMAGING AND PROCEDURES  Imaging and Procedures for _0 @  OCT, Retina - OU - Both Eyes       Right Eye Quality was good. Central Foveal Thickness: 423. Progression has worsened. Findings include abnormal foveal contour, intraretinal fluid, no SRF, retinal drusen , outer retinal atrophy (Interval increase in IRF).   Left Eye Quality was good. Central Foveal Thickness: 279. Progression has improved. Findings include abnormal foveal contour, no SRF, intraretinal fluid, outer retinal atrophy, retinal drusen  (Mild interval improvement in IRF/cystic changes and foveal contour).    Notes *Images captured and stored on drive  Diagnosis / Impression:  DME OU, OD>OS OD - interval increase in IRF OS - interval improvement in IRF and foveal contour; focal areas of outer retinal atrophy -- stable from prior  Clinical management:  See below  Abbreviations: NFP - Normal foveal profile. CME - cystoid macular edema. PED - pigment epithelial detachment. IRF - intraretinal fluid. SRF - subretinal fluid. EZ - ellipsoid zone. ERM - epiretinal membrane. ORA - outer retinal atrophy. ORT - outer retinal tubulation. SRHM - subretinal hyper-reflective material         Intravitreal Injection, Pharmacologic Agent - OD - Right Eye       Time Out 10/29/2018. 9:12 AM. Confirmed correct patient, procedure, site, and patient consented.   Anesthesia Topical anesthesia was used. Anesthetic medications included Lidocaine 2%, Proparacaine 0.5%.   Procedure Preparation included 5% betadine to ocular surface, eyelid speculum. A supplied needle was used.   Injection:  1.25 mg Bevacizumab (AVASTIN) SOLN   NDC: 37902-409-73, Lot: [id number] , Expiration date: 11/09/2018   Route: Intravitreal, Site: Right Eye, Waste: 0 mL  Post-op Post injection exam found visual acuity of at least counting fingers. The patient tolerated the procedure well. There were no complications. The patient received written and verbal post procedure care education.        Intravitreal Injection, Pharmacologic Agent - OS - Left Eye       Time Out 10/29/2018. 9:11 AM. Confirmed correct patient, procedure, site, and patient consented.   Anesthesia Topical anesthesia was used. Anesthetic medications included Lidocaine 2%, Proparacaine 0.5%.   Procedure Preparation included 5% betadine to ocular surface, eyelid speculum. A supplied needle  was used.   Injection:  1.25 mg Bevacizumab (AVASTIN) SOLN   NDC: 83151-761-60, Lot: [id number] , Expiration date: 11/22/2018   Route: Intravitreal, Site: Left Eye,  Waste: 0 mL  Post-op Post injection exam found visual acuity of at least counting fingers. The patient tolerated the procedure well. There were no complications. The patient received written and verbal post procedure care education.                 ASSESSMENT/PLAN:    ICD-10-CM   1. Moderate nonproliferative diabetic retinopathy of both eyes with macular edema associated with type 2 diabetes mellitus (HCC) V37.1062 Intravitreal Injection, Pharmacologic Agent - OD - Right Eye    Intravitreal Injection, Pharmacologic Agent - OS - Left Eye    Bevacizumab (AVASTIN) SOLN 1.25 mg    Bevacizumab (AVASTIN) SOLN 1.25 mg  2. Retinal edema H35.81 OCT, Retina - OU - Both Eyes  3. Essential hypertension I10   4. Hypertensive retinopathy of both eyes H35.033   5. Pseudophakia of both eyes Z96.1     1,2. Moderate non-proliferative diabetic retinopathy with edema OU - moved to [place] from [place], [place] -- history of prior injections OS with Dr. [name] - records received and reviewed from [facility] (Dr. [name]) - history of focal laser OS x2 and IVK/IVT OU in [place] -- last injection was IVK OS November 2013 - exam scattered IRH, DBH and +macular edema - OCT shows diabetic macular edema, both eyes, (OD > OS) - S/P IVA OD #1 (09.17.19), #2 (10.29.19), #3 (11.26.19), #4 (01.02.20) - S/P IVA OS #1 (10.01.19), #2 (10.29.19), #3 (11.26.19), #4 (01.02.20) - FA (10.1.19) showed late leaking MA OU -- no NV - today, OCT shows persistent IRF OU; BCVA 20/40 OD, 20/30 OS - recommend IVA OU #5 today (01.30.20), but discussed possibility of switching medications if OD continues to worsen - pt wishes to proceed - RBA of procedure discussed, questions answered - informed consent obtained and signed - see procedure note - [id number] benefits investigation initiated 1.31.2020 - f/u in 4 weeks, DFE, OCT, possible injection  3,4. Hypertensive retinopathy OU - discussed importance of tight BP  control - monitor  5. Pseudophakia OU  - s/p CE/IOL OU in [place], [place]  - beautiful surgeries, doing well  - monitor   Ophthalmic Meds Ordered this visit:  Meds ordered this encounter  Medications  . Bevacizumab (AVASTIN) SOLN 1.25 mg  . Bevacizumab (AVASTIN) SOLN 1.25 mg       Return in about 4 weeks (around 11/26/2018) for Dilated Exam, OCT, Possible Injxn.  There are no Patient Instructions on file for this visit.   Explained the diagnoses, plan, and follow up with the patient and they expressed understanding.  Patient expressed understanding of the importance of proper follow up care.   This document serves as a record of services personally performed by Gardiner Sleeper, MD, PhD. It was created on their behalf by Ernest Mallick, OA, an ophthalmic assistant. The creation of this record is the provider's dictation and/or activities during the visit.    Electronically signed by: Ernest Mallick, OA  01.28.2020 2:46 PM    Gardiner Sleeper, M.D., Ph.D. Diseases & Surgery of the Retina and Vitreous [facility]  I have reviewed the above documentation for accuracy and completeness, and I agree with the above. Gardiner Sleeper, M.D., Ph.D. 10/29/18 2:49 PM    Abbreviations: M myopia (nearsighted); A astigmatism; H hyperopia (farsighted); P presbyopia; Mrx  spectacle prescription;  CTL contact lenses; OD right eye; OS left eye; OU both eyes  XT exotropia; ET esotropia; PEK punctate epithelial keratitis; PEE punctate epithelial erosions; DES dry eye syndrome; MGD meibomian gland dysfunction; ATs artificial tears; PFAT's preservative free artificial tears; [place] nuclear sclerotic cataract; PSC posterior subcapsular cataract; ERM epi-retinal membrane; PVD posterior vitreous detachment; RD retinal detachment; DM diabetes mellitus; DR diabetic retinopathy; NPDR non-proliferative diabetic retinopathy; PDR proliferative diabetic retinopathy; CSME clinically significant macular  edema; DME diabetic macular edema; dbh dot blot hemorrhages; CWS cotton wool spot; POAG primary open angle glaucoma; C/D cup-to-disc ratio; HVF humphrey visual field; GVF goldmann visual field; OCT optical coherence tomography; IOP intraocular pressure; BRVO Branch retinal vein occlusion; CRVO central retinal vein occlusion; CRAO central retinal artery occlusion; BRAO branch retinal artery occlusion; RT retinal tear; SB scleral buckle; PPV pars plana vitrectomy; VH Vitreous hemorrhage; PRP panretinal laser photocoagulation; IVK intravitreal kenalog; VMT vitreomacular traction; MH Macular hole;  NVD neovascularization of the disc; NVE neovascularization elsewhere; AREDS age related eye disease study; ARMD age related macular degeneration; POAG primary open angle glaucoma; EBMD epithelial/anterior basement membrane dystrophy; ACIOL anterior chamber intraocular lens; IOL intraocular lens; PCIOL posterior chamber intraocular lens; Phaco/IOL phacoemulsification with intraocular lens placement; [place] photorefractive keratectomy; LASIK laser assisted in situ keratomileusis; HTN hypertension; DM diabetes mellitus; COPD chronic obstructive pulmonary disease

## 2018-10-28 DIAGNOSIS — N183 Chronic kidney disease, stage 3 (moderate): Secondary | ICD-10-CM | POA: Diagnosis not present

## 2018-10-28 DIAGNOSIS — K5730 Diverticulosis of large intestine without perforation or abscess without bleeding: Secondary | ICD-10-CM | POA: Diagnosis not present

## 2018-10-28 DIAGNOSIS — K922 Gastrointestinal hemorrhage, unspecified: Secondary | ICD-10-CM | POA: Diagnosis not present

## 2018-10-28 DIAGNOSIS — I129 Hypertensive chronic kidney disease with stage 1 through stage 4 chronic kidney disease, or unspecified chronic kidney disease: Secondary | ICD-10-CM | POA: Diagnosis not present

## 2018-10-28 DIAGNOSIS — I2510 Atherosclerotic heart disease of native coronary artery without angina pectoris: Secondary | ICD-10-CM | POA: Diagnosis not present

## 2018-10-28 DIAGNOSIS — R7989 Other specified abnormal findings of blood chemistry: Secondary | ICD-10-CM | POA: Diagnosis not present

## 2018-10-28 DIAGNOSIS — E1122 Type 2 diabetes mellitus with diabetic chronic kidney disease: Secondary | ICD-10-CM | POA: Diagnosis not present

## 2018-10-28 DIAGNOSIS — D649 Anemia, unspecified: Secondary | ICD-10-CM | POA: Diagnosis not present

## 2018-10-28 DIAGNOSIS — D472 Monoclonal gammopathy: Secondary | ICD-10-CM | POA: Diagnosis not present

## 2018-10-29 ENCOUNTER — Ambulatory Visit (INDEPENDENT_AMBULATORY_CARE_PROVIDER_SITE_OTHER): Payer: Medicare PPO | Admitting: Ophthalmology

## 2018-10-29 ENCOUNTER — Encounter (INDEPENDENT_AMBULATORY_CARE_PROVIDER_SITE_OTHER): Payer: Self-pay | Admitting: Ophthalmology

## 2018-10-29 DIAGNOSIS — E113313 Type 2 diabetes mellitus with moderate nonproliferative diabetic retinopathy with macular edema, bilateral: Secondary | ICD-10-CM

## 2018-10-29 DIAGNOSIS — Z961 Presence of intraocular lens: Secondary | ICD-10-CM

## 2018-10-29 DIAGNOSIS — I10 Essential (primary) hypertension: Secondary | ICD-10-CM

## 2018-10-29 DIAGNOSIS — H35033 Hypertensive retinopathy, bilateral: Secondary | ICD-10-CM | POA: Diagnosis not present

## 2018-10-29 DIAGNOSIS — H3581 Retinal edema: Secondary | ICD-10-CM | POA: Diagnosis not present

## 2018-10-29 MED ORDER — BEVACIZUMAB CHEMO INJECTION 1.25MG/0.05ML SYRINGE FOR KALEIDOSCOPE
1.2500 mg | INTRAVITREAL | Status: DC
  Administered 2018-10-29: 1.25 mg via INTRAVITREAL

## 2018-11-02 DIAGNOSIS — I129 Hypertensive chronic kidney disease with stage 1 through stage 4 chronic kidney disease, or unspecified chronic kidney disease: Secondary | ICD-10-CM | POA: Diagnosis not present

## 2018-11-02 DIAGNOSIS — I2510 Atherosclerotic heart disease of native coronary artery without angina pectoris: Secondary | ICD-10-CM | POA: Diagnosis not present

## 2018-11-02 DIAGNOSIS — K922 Gastrointestinal hemorrhage, unspecified: Secondary | ICD-10-CM | POA: Diagnosis not present

## 2018-11-02 DIAGNOSIS — E1122 Type 2 diabetes mellitus with diabetic chronic kidney disease: Secondary | ICD-10-CM | POA: Diagnosis not present

## 2018-11-02 DIAGNOSIS — K5730 Diverticulosis of large intestine without perforation or abscess without bleeding: Secondary | ICD-10-CM | POA: Diagnosis not present

## 2018-11-02 DIAGNOSIS — R7989 Other specified abnormal findings of blood chemistry: Secondary | ICD-10-CM | POA: Diagnosis not present

## 2018-11-02 DIAGNOSIS — D649 Anemia, unspecified: Secondary | ICD-10-CM | POA: Diagnosis not present

## 2018-11-02 DIAGNOSIS — M545 Low back pain: Secondary | ICD-10-CM | POA: Diagnosis not present

## 2018-11-02 DIAGNOSIS — D472 Monoclonal gammopathy: Secondary | ICD-10-CM | POA: Diagnosis not present

## 2018-11-02 DIAGNOSIS — N183 Chronic kidney disease, stage 3 (moderate): Secondary | ICD-10-CM | POA: Diagnosis not present

## 2018-11-04 DIAGNOSIS — R7989 Other specified abnormal findings of blood chemistry: Secondary | ICD-10-CM | POA: Diagnosis not present

## 2018-11-04 DIAGNOSIS — D472 Monoclonal gammopathy: Secondary | ICD-10-CM | POA: Diagnosis not present

## 2018-11-04 DIAGNOSIS — E1122 Type 2 diabetes mellitus with diabetic chronic kidney disease: Secondary | ICD-10-CM | POA: Diagnosis not present

## 2018-11-04 DIAGNOSIS — K5730 Diverticulosis of large intestine without perforation or abscess without bleeding: Secondary | ICD-10-CM | POA: Diagnosis not present

## 2018-11-04 DIAGNOSIS — N183 Chronic kidney disease, stage 3 (moderate): Secondary | ICD-10-CM | POA: Diagnosis not present

## 2018-11-04 DIAGNOSIS — I129 Hypertensive chronic kidney disease with stage 1 through stage 4 chronic kidney disease, or unspecified chronic kidney disease: Secondary | ICD-10-CM | POA: Diagnosis not present

## 2018-11-04 DIAGNOSIS — K922 Gastrointestinal hemorrhage, unspecified: Secondary | ICD-10-CM | POA: Diagnosis not present

## 2018-11-04 DIAGNOSIS — D649 Anemia, unspecified: Secondary | ICD-10-CM | POA: Diagnosis not present

## 2018-11-04 DIAGNOSIS — I2510 Atherosclerotic heart disease of native coronary artery without angina pectoris: Secondary | ICD-10-CM | POA: Diagnosis not present

## 2018-11-10 DIAGNOSIS — I129 Hypertensive chronic kidney disease with stage 1 through stage 4 chronic kidney disease, or unspecified chronic kidney disease: Secondary | ICD-10-CM | POA: Diagnosis not present

## 2018-11-10 DIAGNOSIS — R7989 Other specified abnormal findings of blood chemistry: Secondary | ICD-10-CM | POA: Diagnosis not present

## 2018-11-10 DIAGNOSIS — E1122 Type 2 diabetes mellitus with diabetic chronic kidney disease: Secondary | ICD-10-CM | POA: Diagnosis not present

## 2018-11-10 DIAGNOSIS — K5730 Diverticulosis of large intestine without perforation or abscess without bleeding: Secondary | ICD-10-CM | POA: Diagnosis not present

## 2018-11-10 DIAGNOSIS — D649 Anemia, unspecified: Secondary | ICD-10-CM | POA: Diagnosis not present

## 2018-11-10 DIAGNOSIS — D472 Monoclonal gammopathy: Secondary | ICD-10-CM | POA: Diagnosis not present

## 2018-11-10 DIAGNOSIS — I2510 Atherosclerotic heart disease of native coronary artery without angina pectoris: Secondary | ICD-10-CM | POA: Diagnosis not present

## 2018-11-10 DIAGNOSIS — K922 Gastrointestinal hemorrhage, unspecified: Secondary | ICD-10-CM | POA: Diagnosis not present

## 2018-11-10 DIAGNOSIS — N183 Chronic kidney disease, stage 3 (moderate): Secondary | ICD-10-CM | POA: Diagnosis not present

## 2018-11-11 ENCOUNTER — Ambulatory Visit: Payer: Medicare PPO

## 2018-11-11 DIAGNOSIS — I6522 Occlusion and stenosis of left carotid artery: Secondary | ICD-10-CM

## 2018-11-11 DIAGNOSIS — R0989 Other specified symptoms and signs involving the circulatory and respiratory systems: Secondary | ICD-10-CM | POA: Diagnosis not present

## 2018-11-15 ENCOUNTER — Other Ambulatory Visit: Payer: Self-pay | Admitting: Cardiology

## 2018-11-15 DIAGNOSIS — I6521 Occlusion and stenosis of right carotid artery: Secondary | ICD-10-CM

## 2018-11-15 DIAGNOSIS — I6522 Occlusion and stenosis of left carotid artery: Secondary | ICD-10-CM

## 2018-11-15 DIAGNOSIS — R0989 Other specified symptoms and signs involving the circulatory and respiratory systems: Secondary | ICD-10-CM

## 2018-11-22 DIAGNOSIS — R634 Abnormal weight loss: Secondary | ICD-10-CM | POA: Diagnosis not present

## 2018-11-22 DIAGNOSIS — K219 Gastro-esophageal reflux disease without esophagitis: Secondary | ICD-10-CM | POA: Diagnosis not present

## 2018-11-22 DIAGNOSIS — R1084 Generalized abdominal pain: Secondary | ICD-10-CM | POA: Diagnosis not present

## 2018-11-22 DIAGNOSIS — R197 Diarrhea, unspecified: Secondary | ICD-10-CM | POA: Diagnosis not present

## 2018-11-22 DIAGNOSIS — R630 Anorexia: Secondary | ICD-10-CM | POA: Diagnosis not present

## 2018-11-24 DIAGNOSIS — N2581 Secondary hyperparathyroidism of renal origin: Secondary | ICD-10-CM | POA: Diagnosis not present

## 2018-11-24 DIAGNOSIS — N183 Chronic kidney disease, stage 3 (moderate): Secondary | ICD-10-CM | POA: Diagnosis not present

## 2018-11-24 DIAGNOSIS — I129 Hypertensive chronic kidney disease with stage 1 through stage 4 chronic kidney disease, or unspecified chronic kidney disease: Secondary | ICD-10-CM | POA: Diagnosis not present

## 2018-11-24 DIAGNOSIS — D631 Anemia in chronic kidney disease: Secondary | ICD-10-CM | POA: Diagnosis not present

## 2018-11-24 NOTE — Progress Notes (Addendum)
[facility] Note  11/26/2018     CHIEF COMPLAINT Patient presents for Retina Follow Up   HISTORY OF PRESENT ILLNESS: Justin [name] is a 85 y.o. male who presents to the clinic today for:   HPI    Retina Follow Up    Patient presents with  Diabetic Retinopathy.  In both eyes.  Severity is moderate.  Duration of 4 weeks.  Since onset it is stable.  I, the attending physician,  performed the HPI with the patient and updated documentation appropriately.          Comments    Patient states vision the same OU. BS was 105 this am. Last A1c unknown.        Last edited by [name], MD on 11/26/2018  8:18 AM. (History)    pt states his right eye vision seems to be getting better  Referring physician: [name], MD [place], [place] [address]  HISTORICAL INFORMATION:   Selected notes from the MEDICAL RECORD NUMBER Referred by Dr. [name] for concern of mac edema OU LEE:  Ocular Hx-pseudo OU; formerly followed at [facility] Dr. [name]. S/p focal laser OS x2, history of IVK and IVT OU PMH-anxiety, arthritis, CAD, DM (taking lantus and januvia), HTN, high cholesterol, prostate cancer    CURRENT MEDICATIONS: No current outpatient medications on file. (Ophthalmic Drugs)   No current facility-administered medications for this visit.  (Ophthalmic Drugs)   Current Outpatient Medications (Other)  Medication Sig  . AMITIZA 24 MCG capsule Take 24 mcg by mouth daily as needed for constipation.   Marland Kitchen aspirin EC 81 MG tablet Take 81 mg by mouth daily.  . chlorthalidone (HYGROTON) 25 MG tablet Take 25 mg by mouth daily.  . cilostazol (PLETAL) 100 MG tablet Take 100 mg by mouth daily.  . clonazePAM (KLONOPIN) 0.5 MG tablet Take 0.5 mg by mouth daily as needed for anxiety.  . DULoxetine HCl 40 MG CPEP Take 1 capsule by mouth 2 (two) times daily.  . DULoxetine HCl 40 MG CSDR Take 1 capsule by mouth 2 (two)  times daily.  . isosorbide mononitrate (IMDUR) 60 MG 24 hr tablet Take 60 mg by mouth daily.  Marland Kitchen JANUVIA 100 MG tablet Take 100 mg by mouth daily.   Marland Kitchen LANTUS SOLOSTAR 100 UNIT/ML Solostar Pen Inject 37 Units into the skin 2 (two) times daily.   Marland Kitchen loperamide (IMODIUM) 2 MG capsule Take 2 mg by mouth 3 (three) times daily.  . metoprolol (LOPRESSOR) 50 MG tablet Take 50 mg by mouth 2 (two) times daily.  . nitroGLYCERIN (NITROSTAT) 0.4 MG SL tablet Place 0.4 mg under the tongue every 5 (five) minutes as needed for chest pain.   Marland Kitchen Pyridoxine HCl (VITAMIN B-6 PO) Take 1 tablet by mouth daily.  . rosuvastatin (CRESTOR) 20 MG tablet Take 20 mg by mouth daily.  . tamsulosin (FLOMAX) 0.4 MG CAPS capsule Take 0.4 mg by mouth daily.   Current Facility-Administered Medications (Other)  Medication Route  . Bevacizumab (AVASTIN) SOLN 1.25 mg Intravitreal  . Bevacizumab (AVASTIN) SOLN 1.25 mg Intravitreal  . Bevacizumab (AVASTIN) SOLN 1.25 mg Intravitreal  . Bevacizumab (AVASTIN) SOLN 1.25 mg Intravitreal      REVIEW OF SYSTEMS: ROS    Positive for: Endocrine, Eyes, Psychiatric   Negative for: Constitutional, Gastrointestinal, Neurological, Skin, Genitourinary, Musculoskeletal, HENT, Cardiovascular, Respiratory, Allergic/Imm, Heme/Lymph   Last edited by Roselee Nova D on 11/26/2018  8:07 AM. (History)       ALLERGIES Allergies  Allergen Reactions  . Dye Fdc Red [Red Dye] Swelling and Other (See Comments)    CAT scan dye  . Ibuprofen Other (See Comments)    Upset stomach     PAST MEDICAL HISTORY Past Medical History:  Diagnosis Date  . Anxiety   . Arthritis   . Coronary artery disease   . Diabetes mellitus without complication ([place])   . Diverticulitis   . Dyspnea   . GERD (gastroesophageal reflux disease)   . Hypercholesteremia   . Hypertension   . Pneumonia    as a child  . Prostate cancer ([place])   . UTI (lower urinary tract infection)    Past Surgical History:  Procedure  Laterality Date  . ABDOMINAL SURGERY     for diverticulitis; also removed appendix  . CATARACT EXTRACTION    . CATARACT EXTRACTION, BILATERAL    . CHOLECYSTECTOMY N/A 10/12/2017   Procedure: LAPAROSCOPIC CHOLECYSTECTOMY;  Surgeon: [name], MD;  Location: [facility];  Service: General;  Laterality: N/A;  . CORONARY ARTERY BYPASS GRAFT  2009  . EYE SURGERY     "for bleeding in eye"  . HERNIA REPAIR    . LEFT HEART CATH AND CORONARY ANGIOGRAPHY N/A 11/04/2016   Procedure: Left Heart Cath and Coronary Angiography;  Surgeon: [name], MD;  Location: [facility];  Service: Cardiovascular;  Laterality: N/A;  . LOWER EXTREMITY ANGIOGRAPHY N/A 11/04/2016   Procedure: Lower Extremity Angiography;  Surgeon: [name], MD;  Location: [facility];  Service: Cardiovascular;  Laterality: N/A;  . PROSTATE SURGERY      FAMILY HISTORY Family History  Problem Relation Age of Onset  . Diabetes Father   . Diabetes Maternal Aunt   . Diabetes Maternal Uncle   . Diabetes Maternal Grandmother   . Heart disease Sister   . Heart disease Sister     SOCIAL HISTORY Social History   Tobacco Use  . Smoking status: Former Research scientist (life sciences)  . Smokeless tobacco: Never Used  Substance Use Topics  . Alcohol use: No  . Drug use: No         OPHTHALMIC EXAM:  Base Eye Exam    Visual Acuity (Snellen - Linear)      Right Left   Dist cc 20/30 20/40   Dist ph cc NI 20/30   Correction:  Glasses       Tonometry (Tonopen, 8:12 AM)      Right Left   Pressure 07 08       Pupils      Dark Light Shape React APD   Right 3 2 Round Slow None   Left 3 2 Round Slow None       Visual Fields (Counting fingers)      Left Right    Full Full       Extraocular Movement      Right Left    Full, Ortho Full, Ortho       Neuro/Psych    Oriented x3:  Yes   Mood/Affect:  Normal       Dilation    Both eyes:  1.0% Mydriacyl, 2.5% Phenylephrine @ 8:13 AM        Slit Lamp and Fundus Exam    Slit  Lamp Exam      Right Left   Lids/Lashes Dermatochalasis - upper lid, Meibomian gland dysfunction Dermatochalasis - upper lid, Meibomian gland dysfunction   Conjunctiva/Sclera Melanosis  Melanosis   Cornea Arcus, 1+ Punctate epithelial erosions Arcus, 1+ Punctate epithelial erosions   Anterior Chamber Deep and quiet Deep and quiet   Iris Mild Temporal Iris atrophy, Round and dilated, No NVI Mild Temporal Iris atrophy, Round and dilated, No NVI   Lens PC IOL in good position with mild aterior capsule fimosis PC IOL in good position with mild aterior capsule fimosis   Vitreous Vitreous syneresis, Posterior vitreous detachment Vitreous syneresis, refractile deposits in posterior hyaloid, mild Asteroid hyalosis       Fundus Exam      Right Left   Disc Pink and Sharp, Peripapillary atrophy Tilted disc, mild temporal Temporal Peripapillary atrophy   C/D Ratio 0.3 0.3   Macula Blunted foveal reflex, Retinal pigment epithelial mottling and clumping, Microaneurysms -- improved, cystic changes slightly decreased Blunted foveal reflex, focal area of RPE atrophy SN to fovea, Microaneurysms, Retinal pigment epithelial mottling, refractile Epiretinal membrane, persistent cystic changes   Vessels Tortuous, AV crossing changes Tortuous, Vascular attenuation   Periphery Attached, scattered MA Attached, flame heme at 1100 midzone        Refraction    Wearing Rx      Sphere Cylinder Axis Add   Right +0.25 +0.50 010 +3.00   Left [place] +0.75 173 +3.00   Type:  Bifocal          IMAGING AND PROCEDURES  Imaging and Procedures for _0 @  OCT, Retina - OU - Both Eyes       Right Eye Quality was good. Central Foveal Thickness: 387. Progression has improved. Findings include abnormal foveal contour, intraretinal fluid, no SRF, retinal drusen , outer retinal atrophy (Interval decrease in IRF).   Left Eye Quality was good. Central Foveal Thickness: 273. Progression has been stable. Findings include  abnormal foveal contour, no SRF, intraretinal fluid, outer retinal atrophy, retinal drusen  (Persist IRF/cystic changes).   Notes *Images captured and stored on drive  Diagnosis / Impression:  DME OU, OD>OS OD - interval decrease in IRF OS - persistent IRF; focal areas of outer retinal atrophy -- stable from prior  Clinical management:  See below  Abbreviations: NFP - Normal foveal profile. CME - cystoid macular edema. PED - pigment epithelial detachment. IRF - intraretinal fluid. SRF - subretinal fluid. EZ - ellipsoid zone. ERM - epiretinal membrane. ORA - outer retinal atrophy. ORT - outer retinal tubulation. SRHM - subretinal hyper-reflective material         Intravitreal Injection, Pharmacologic Agent - OD - Right Eye       Time Out 11/26/2018. 9:11 AM. Confirmed correct patient, procedure, site, and patient consented.   Anesthesia Topical anesthesia was used. Anesthetic medications included Lidocaine 2%, Proparacaine 0.5%.   Procedure Preparation included 5% betadine to ocular surface, eyelid speculum. A supplied needle was used.   Injection:  1.25 mg Bevacizumab (AVASTIN) SOLN   NDC: 32202-542-70, Lot: [id number] , Expiration date: 01/19/2019   Route: Intravitreal, Site: Right Eye, Waste: 0 mL  Post-op Post injection exam found visual acuity of at least counting fingers. The patient tolerated the procedure well. There were no complications. The patient received written and verbal post procedure care education.        Intravitreal Injection, Pharmacologic Agent - OS - Left Eye       Time Out 11/26/2018. 9:11 AM. Confirmed correct patient, procedure, site, and patient consented.   Anesthesia Topical anesthesia was used. Anesthetic medications included Lidocaine 2%, Proparacaine 0.5%.   Procedure Preparation included 5% betadine to ocular  surface, eyelid speculum. A supplied needle was used.   Injection:  1.25 mg Bevacizumab (AVASTIN) SOLN   NDC: 39532-023-34,  Expiration date: 01/12/2019   Route: Intravitreal, Site: Left Eye, Waste: 0 mg  Post-op Post injection exam found visual acuity of at least counting fingers. The patient tolerated the procedure well. There were no complications. The patient received written and verbal post procedure care education.                 ASSESSMENT/PLAN:    ICD-10-CM   1. Moderate nonproliferative diabetic retinopathy of both eyes with macular edema associated with type 2 diabetes mellitus (HCC) D56.8616 Intravitreal Injection, Pharmacologic Agent - OD - Right Eye    Intravitreal Injection, Pharmacologic Agent - OS - Left Eye    Bevacizumab (AVASTIN) SOLN 1.25 mg    Bevacizumab (AVASTIN) SOLN 1.25 mg  2. Retinal edema H35.81 OCT, Retina - OU - Both Eyes  3. Essential hypertension I10   4. Hypertensive retinopathy of both eyes H35.033   5. Pseudophakia of both eyes Z96.1     1,2. Moderate non-proliferative diabetic retinopathy with edema OU - moved to [place] from [place], [place] -- history of prior injections OS with Dr. [name] - records received and reviewed from [facility] (Dr. [name]) - history of focal laser OS x2 and IVK/IVT OU in [place] -- last injection was IVK OS November 2013 - exam scattered IRH, DBH and +macular edema - OCT shows diabetic macular edema, both eyes, (OD > OS) - S/P IVA OD #1 (09.17.19), #2 (10.29.19), #3 (11.26.19), #4 (01.02.20), #5 (01.30.20) - S/P IVA OS #1 (10.01.19), #2 (10.29.19), #3 (11.26.19), #4 (01.02.20), #5 (01.30.20) - FA (10.1.19) showed late leaking MA OU -- no NV - today, OCT shows interval improvement in IRF OD, persistent IRF OS;  - BCVA 20/30 OU - recommend IVA OU #6 today (02.28.20) - pt wishes to proceed - RBA of procedure discussed, questions answered - informed consent obtained and signed - see procedure note - [id number] benefits investigation initiated 1.31.2020 - f/u in 4 weeks, DFE, OCT, possible injection  3,4. Hypertensive  retinopathy OU - discussed importance of tight BP control - monitor  5. Pseudophakia OU  - s/p CE/IOL OU in [place], [place]  - beautiful surgeries, doing well  - monitor   Ophthalmic Meds Ordered this visit:  Meds ordered this encounter  Medications  . Bevacizumab (AVASTIN) SOLN 1.25 mg  . Bevacizumab (AVASTIN) SOLN 1.25 mg       Return in about 4 weeks (around 12/24/2018) for f/u NPDR OU, DFE, OCT.  There are no Patient Instructions on file for this visit.   Explained the diagnoses, plan, and follow up with the patient and they expressed understanding.  Patient expressed understanding of the importance of proper follow up care.   This document serves as a record of services personally performed by Gardiner Sleeper, MD, PhD. It was created on their behalf by Ernest Mallick, OA, an ophthalmic assistant. The creation of this record is the provider's dictation and/or activities during the visit.    Electronically signed by: Ernest Mallick, OA  02.26.2020 9:13 AM     Gardiner Sleeper, M.D., Ph.D. Diseases & Surgery of the Retina and Vitreous [facility]  I have reviewed the above documentation for accuracy and completeness, and I agree with the above. Gardiner Sleeper, M.D., Ph.D. 11/26/18 9:13 AM    Abbreviations: M myopia (nearsighted); A astigmatism; H hyperopia (farsighted);  P presbyopia; Mrx spectacle prescription;  CTL contact lenses; OD right eye; OS left eye; OU both eyes  XT exotropia; ET esotropia; PEK punctate epithelial keratitis; PEE punctate epithelial erosions; DES dry eye syndrome; MGD meibomian gland dysfunction; ATs artificial tears; PFAT's preservative free artificial tears; [place] nuclear sclerotic cataract; PSC posterior subcapsular cataract; ERM epi-retinal membrane; PVD posterior vitreous detachment; RD retinal detachment; DM diabetes mellitus; DR diabetic retinopathy; NPDR non-proliferative diabetic retinopathy; PDR proliferative diabetic  retinopathy; CSME clinically significant macular edema; DME diabetic macular edema; dbh dot blot hemorrhages; CWS cotton wool spot; POAG primary open angle glaucoma; C/D cup-to-disc ratio; HVF humphrey visual field; GVF goldmann visual field; OCT optical coherence tomography; IOP intraocular pressure; BRVO Branch retinal vein occlusion; CRVO central retinal vein occlusion; CRAO central retinal artery occlusion; BRAO branch retinal artery occlusion; RT retinal tear; SB scleral buckle; PPV pars plana vitrectomy; VH Vitreous hemorrhage; PRP panretinal laser photocoagulation; IVK intravitreal kenalog; VMT vitreomacular traction; MH Macular hole;  NVD neovascularization of the disc; NVE neovascularization elsewhere; AREDS age related eye disease study; ARMD age related macular degeneration; POAG primary open angle glaucoma; EBMD epithelial/anterior basement membrane dystrophy; ACIOL anterior chamber intraocular lens; IOL intraocular lens; PCIOL posterior chamber intraocular lens; Phaco/IOL phacoemulsification with intraocular lens placement; [place] photorefractive keratectomy; LASIK laser assisted in situ keratomileusis; HTN hypertension; DM diabetes mellitus; COPD chronic obstructive pulmonary disease

## 2018-11-26 ENCOUNTER — Encounter (INDEPENDENT_AMBULATORY_CARE_PROVIDER_SITE_OTHER): Payer: Self-pay | Admitting: Ophthalmology

## 2018-11-26 ENCOUNTER — Ambulatory Visit (INDEPENDENT_AMBULATORY_CARE_PROVIDER_SITE_OTHER): Payer: Medicare PPO | Admitting: Ophthalmology

## 2018-11-26 DIAGNOSIS — E113313 Type 2 diabetes mellitus with moderate nonproliferative diabetic retinopathy with macular edema, bilateral: Secondary | ICD-10-CM | POA: Diagnosis not present

## 2018-11-26 DIAGNOSIS — H3581 Retinal edema: Secondary | ICD-10-CM | POA: Diagnosis not present

## 2018-11-26 DIAGNOSIS — H35033 Hypertensive retinopathy, bilateral: Secondary | ICD-10-CM | POA: Diagnosis not present

## 2018-11-26 DIAGNOSIS — I10 Essential (primary) hypertension: Secondary | ICD-10-CM | POA: Diagnosis not present

## 2018-11-26 DIAGNOSIS — Z961 Presence of intraocular lens: Secondary | ICD-10-CM | POA: Diagnosis not present

## 2018-11-26 MED ORDER — BEVACIZUMAB CHEMO INJECTION 1.25MG/0.05ML SYRINGE FOR KALEIDOSCOPE
1.2500 mg | INTRAVITREAL | Status: DC
  Administered 2018-11-26: 1.25 mg via INTRAVITREAL

## 2018-12-06 ENCOUNTER — Encounter: Payer: Self-pay | Admitting: Cardiology

## 2018-12-06 ENCOUNTER — Ambulatory Visit: Payer: Medicare PPO | Admitting: Cardiology

## 2018-12-06 ENCOUNTER — Other Ambulatory Visit: Payer: Self-pay

## 2018-12-06 VITALS — BP 98/58 | HR 93 | Ht 67.5 in | Wt 176.9 lb

## 2018-12-06 DIAGNOSIS — I2510 Atherosclerotic heart disease of native coronary artery without angina pectoris: Secondary | ICD-10-CM

## 2018-12-06 DIAGNOSIS — E1122 Type 2 diabetes mellitus with diabetic chronic kidney disease: Secondary | ICD-10-CM | POA: Diagnosis not present

## 2018-12-06 DIAGNOSIS — I6521 Occlusion and stenosis of right carotid artery: Secondary | ICD-10-CM | POA: Diagnosis not present

## 2018-12-06 DIAGNOSIS — I739 Peripheral vascular disease, unspecified: Secondary | ICD-10-CM

## 2018-12-06 DIAGNOSIS — N183 Chronic kidney disease, stage 3 unspecified: Secondary | ICD-10-CM

## 2018-12-06 DIAGNOSIS — Z951 Presence of aortocoronary bypass graft: Secondary | ICD-10-CM | POA: Insufficient documentation

## 2018-12-06 DIAGNOSIS — I951 Orthostatic hypotension: Secondary | ICD-10-CM

## 2018-12-06 DIAGNOSIS — R748 Abnormal levels of other serum enzymes: Secondary | ICD-10-CM | POA: Diagnosis not present

## 2018-12-06 MED ORDER — NITROGLYCERIN 0.4 MG SL SUBL: 0.4000 mg | SUBLINGUAL_TABLET | SUBLINGUAL | 0 refills | Status: DC | PRN

## 2018-12-06 MED ORDER — CILOSTAZOL 100 MG PO TABS: 100.0000 mg | ORAL_TABLET | Freq: Two times a day (BID) | ORAL | 1 refills | Status: DC

## 2018-12-06 MED ORDER — ISOSORBIDE MONONITRATE ER 30 MG PO TB24: 30.0000 mg | ORAL_TABLET | Freq: Every day | ORAL | 3 refills | Status: DC

## 2018-12-06 NOTE — Progress Notes (Signed)
Subjective:   Justin [name], male    DOB: 07/27/1933, 85 y.o.   MRN: [id number]  Justin Mccreedy, MD:  Chief Complaint  Patient presents with  . PAD  . Follow-up    3 mth fu after carotid, last EKG 06/24/18    HPI: Justin [name]  is a 85 y.o. male  with history of CAD, S/P CABG in 2009, PAD, type II diabetes, asymptomatic right carotid stenosis, and hyperlipidemia. Underwent coronary angiogram on 11/04/2016 at the same time also underwent peripheral arteriogram due to severe symptoms of claudication involving bilateral calf, showed mild disease in SFA and severe small vessel disease left worse in the right with slow flow in left and 2 vessel runoff. Patent coronary grafts except occluded svg to rca which is small and diffusely diseasd.ABI performed on 11/18/2017 revealed a right TBI of 0.6 and left TBI of 0.54. There was no significant change in lower extremity duplex compared to 2017.  Patient is here on 3 month office visit for PAD. He has been started back on Ramipril and CKD has remained stable; however, patient states that he did not start this. He was admitted to the hospital in early January for abdominal pain and elevated liver enzymes. Abdominal pain questionable for IBS. He is being followed by Dr. Benson Norway. Crestor was discontinued at discharge; however, patient reports that he is currently taking. Patient is unsure about several of his medications and did not bring his prescriptions with him today.  He does have dizziness with sudden position changes. No syncope.   Past Medical History:  Diagnosis Date  . Anxiety   . Arthritis   . Coronary artery disease   . Diabetes mellitus without complication ([place])   . Diverticulitis   . Dyspnea   . GERD (gastroesophageal reflux disease)   . Hypercholesteremia   . Hypertension   . Pneumonia    as a child  . Prostate cancer ([place])   . UTI (lower urinary tract infection)     Past Surgical History:  Procedure Laterality Date  .  ABDOMINAL SURGERY     for diverticulitis; also removed appendix  . CATARACT EXTRACTION    . CATARACT EXTRACTION, BILATERAL    . CHOLECYSTECTOMY N/A 10/12/2017   Procedure: LAPAROSCOPIC CHOLECYSTECTOMY;  Surgeon: [name], MD;  Location: [facility];  Service: General;  Laterality: N/A;  . CORONARY ARTERY BYPASS GRAFT  2009  . EYE SURGERY     "for bleeding in eye"  . HERNIA REPAIR    . LEFT HEART CATH AND CORONARY ANGIOGRAPHY N/A 11/04/2016   Procedure: Left Heart Cath and Coronary Angiography;  Surgeon: [name], MD;  Location: [facility];  Service: Cardiovascular;  Laterality: N/A;  . LOWER EXTREMITY ANGIOGRAPHY N/A 11/04/2016   Procedure: Lower Extremity Angiography;  Surgeon: [name], MD;  Location: [facility];  Service: Cardiovascular;  Laterality: N/A;  . PROSTATE SURGERY      Family History  Problem Relation Age of Onset  . Diabetes Father   . Diabetes Maternal Aunt   . Diabetes Maternal Uncle   . Diabetes Maternal Grandmother   . Heart disease Sister   . Diabetes Brother   . Heart disease Sister     Social History   Socioeconomic History  . Marital status: Widowed    Spouse name: Not on file  . Number of children: 4  . Years of education: Not on file  . Highest education level: Not on file  Occupational  History  . Not on file  Social Needs  . Financial resource strain: Not on file  . Food insecurity:    Worry: Not on file    Inability: Not on file  . Transportation needs:    Medical: Not on file    Non-medical: Not on file  Tobacco Use  . Smoking status: Former Research scientist (life sciences)  . Smokeless tobacco: Never Used  Substance and Sexual Activity  . Alcohol use: No  . Drug use: No  . Sexual activity: Not on file  Lifestyle  . Physical activity:    Days per week: Not on file    Minutes per session: Not on file  . Stress: Not on file  Relationships  . Social connections:    Talks on phone: Not on file    Gets together: Not on file    Attends  religious service: Not on file    Active member of club or organization: Not on file    Attends meetings of clubs or organizations: Not on file    Relationship status: Not on file  . Intimate partner violence:    Fear of current or ex partner: Not on file    Emotionally abused: Not on file    Physically abused: Not on file    Forced sexual activity: Not on file  Other Topics Concern  . Not on file  Social History Narrative  . Not on file    Current Meds  Medication Sig  . AMITIZA 24 MCG capsule Take 24 mcg by mouth daily as needed for constipation.   Marland Kitchen aspirin EC 81 MG tablet Take 81 mg by mouth daily.  . chlorthalidone (HYGROTON) 25 MG tablet Take 25 mg by mouth daily.  . cilostazol (PLETAL) 100 MG tablet Take 100 mg by mouth daily.  . clonazePAM (KLONOPIN) 0.5 MG tablet Take 0.5 mg by mouth daily as needed for anxiety.  . isosorbide mononitrate (IMDUR) 60 MG 24 hr tablet Take 60 mg by mouth daily.  Marland Kitchen JANUVIA 100 MG tablet Take 100 mg by mouth daily.   Marland Kitchen LANTUS SOLOSTAR 100 UNIT/ML Solostar Pen Inject 37 Units into the skin 2 (two) times daily.   Marland Kitchen loperamide (IMODIUM) 2 MG capsule Take 2 mg by mouth as needed.   . metoprolol (LOPRESSOR) 50 MG tablet Take 50 mg by mouth 2 (two) times daily.  Marland Kitchen omeprazole (PRILOSEC) 40 MG capsule Take 40 mg by mouth daily.  . pantoprazole (PROTONIX) 40 MG tablet Take 40 mg by mouth daily.  . Pyridoxine HCl (VITAMIN B-6 PO) Take 1 tablet by mouth daily.  . tamsulosin (FLOMAX) 0.4 MG CAPS capsule Take 0.4 mg by mouth daily.   Current Facility-Administered Medications for the 12/06/18 encounter (Office Visit) with Miquel Dunn, NP  Medication  . Bevacizumab (AVASTIN) SOLN 1.25 mg  . Bevacizumab (AVASTIN) SOLN 1.25 mg  . Bevacizumab (AVASTIN) SOLN 1.25 mg  . Bevacizumab (AVASTIN) SOLN 1.25 mg     Review of Systems  Constitution: Positive for weight loss. Negative for decreased appetite, malaise/fatigue and weight gain.  Eyes: Negative  for visual disturbance.  Cardiovascular: Positive for claudication, dyspnea on exertion and leg swelling. Negative for chest pain, orthopnea, palpitations and syncope.  Respiratory: Negative for hemoptysis and wheezing.   Endocrine: Negative for cold intolerance and heat intolerance.  Hematologic/Lymphatic: Does not bruise/bleed easily.  Skin: Negative for nail changes.  Musculoskeletal: Positive for back pain and muscle weakness. Negative for myalgias.  Gastrointestinal: Positive for abdominal pain, constipation and  diarrhea. Negative for change in bowel habit, nausea and vomiting.  Neurological: Positive for dizziness. Negative for difficulty with concentration, focal weakness and headaches.  Psychiatric/Behavioral: Negative for altered mental status and suicidal ideas.  All other systems reviewed and are negative.      Objective:     Blood pressure 128/65, pulse 93, height 5' 7.5" (1.715 m), weight 176 lb 14.4 oz (80.2 kg), SpO2 97 %.  Cardiac studies:  EKG 12/06/2018: Normal sinus rhythm at 69 bpm, left axis deviation, left anterior fasicular block, RBBB. Nonspecific T wave abnormality. No changes compared to EKG 06/24/2018.  Carotid artery duplex 11/11/2018: Stenosis in the right internal carotid artery (50-69%), upper end of spectrum. The right PSV internal/common carotid artery ratio is consistent with a stenosis of >70%. Stenosis in the right external carotid artery (<50%). No significant stenosis left ICA. Antegrade right vertebral artery flow. Antegrade left vertebral artery flow. Compared to the study done on 05/06/2018, right ICA stenosis has mildly progressed by velocity and IDA/CCA ratio. Follow up in six months is appropriate if clinically indicated.  Echo- 12/24/2017 1. Left ventricle cavity is normal in size. Moderate concentric hypertrophy of the left ventricle. Normal global wall motion. Doppler evidence of grade I (impaired) diastolic dysfunction. Calculated EF  54%. 2. Left atrial cavity is mildly dilated. 3. Trileaflet aortic valve. Mild aortic valve leaflet calcification. 4. Mild to moderate mitral regurgitation. Mild calcification of the mitral valve annulus. Mild mitral valve leaflet calcification. 5. Trace tricuspid regurgitation  Lexiscan myoview stress test 04/17/2016: 1. Resting EKG demonstrates normal sinus rhythm, left axis deviation, right bundle branch block. Stress EKG is nondiagnostic for ischemia as a pharmacologic stress test. There are frequent PACs during the stress test. Stress symptoms included dyspnea. 2. The Perfusion images reveal the left ventricle to be mildly dilated at 132 mL both in rest and stress images. There is a moderate-sized inferior and inferoapical scar with very mild peri-infarct ischemia noted especially towards the apex. Left ventricle systolic function was moderate to severely depressed at 36%. 3. This is an intermediate risk study, clinical correlation recommended.  Physical Exam  Constitutional: He is oriented to person, place, and time. Vital signs are normal. He appears well-developed and well-nourished.  HENT:  Head: Normocephalic and atraumatic.  Neck: Normal range of motion.  Cardiovascular: Normal rate, regular rhythm, normal heart sounds and intact distal pulses.  Pulses:      Carotid pulses are on the right side with bruit and on the left side with bruit.      Femoral pulses are 2+ on the right side and 2+ on the left side.      Popliteal pulses are 2+ on the right side and 2+ on the left side.       Dorsalis pedis pulses are 1+ on the right side and 0 on the left side.       Posterior tibial pulses are 0 on the right side and 0 on the left side.  Split S2  Pulmonary/Chest: Effort normal and breath sounds normal. No accessory muscle usage. No respiratory distress.  Abdominal: Soft. Bowel sounds are normal.  Musculoskeletal: Normal range of motion.  Neurological: He is alert and oriented to  person, place, and time.  Skin: Skin is warm and dry.  Vitals reviewed.          Assessment & Recommendations:   1. Asymptomatic stenosis of right carotid artery Slight progression noted by velocity. Would recommend ASA and statin therapy unless GI does not  feel that statin is an option. I am unsure of his recent liver enzyme levels.   2. PAD (peripheral artery disease) ([place]) Vascular exam is unchanged. Symptoms of claudication are stable. No evidence of ischemic limb. Recommend ASA and statin as stated above.   3. Atherosclerosis of native coronary artery of native heart without angina pectoris No symptoms of angina.   4. S/P CABG (coronary artery bypass graft) On appropriate medical therapy.  5. Orthostatic hypotension He has been having symptoms with standing and also occasionally with walking. I have encouraged him to use support stockings. Will try decreasing his dose of Imdur to 1/2 tablet as his blood pressure is well controlled. Will discontinue chlorthalidone. Losartan would also be an option as this can help with orthostasis. He is currently not on Ramipril.   6. Elevated liver enzymes Were trending down at hospital discharge. He is being followed by GI, has had negative workup thus far. Unsure of recent liver enzymes. Ideally, would like for him to be on statin unless GI does not feel appropriate given his recent issues.  7. CKD stage 3 due to type 2 diabetes mellitus ([place]) Being managed by Dr. Elmarie Shiley, he reports CKD has been stable. Will consider addition of losartan if needed in view of his diabetes.    Patients daughter will bring his medications to the office later today for reconciliation. I will see him back in 3 months or sooner if problems.   Jeri Lager, MSN, APRN, FNP-C [facility] [place] Office: [phone] Fax: [phone]

## 2018-12-08 DIAGNOSIS — R634 Abnormal weight loss: Secondary | ICD-10-CM | POA: Diagnosis not present

## 2018-12-13 DIAGNOSIS — N400 Enlarged prostate without lower urinary tract symptoms: Secondary | ICD-10-CM | POA: Diagnosis not present

## 2018-12-13 DIAGNOSIS — I10 Essential (primary) hypertension: Secondary | ICD-10-CM | POA: Diagnosis not present

## 2018-12-13 DIAGNOSIS — F419 Anxiety disorder, unspecified: Secondary | ICD-10-CM | POA: Diagnosis not present

## 2018-12-13 DIAGNOSIS — E118 Type 2 diabetes mellitus with unspecified complications: Secondary | ICD-10-CM | POA: Diagnosis not present

## 2018-12-13 DIAGNOSIS — N183 Chronic kidney disease, stage 3 (moderate): Secondary | ICD-10-CM | POA: Diagnosis not present

## 2018-12-13 DIAGNOSIS — E782 Mixed hyperlipidemia: Secondary | ICD-10-CM | POA: Diagnosis not present

## 2018-12-13 DIAGNOSIS — I2510 Atherosclerotic heart disease of native coronary artery without angina pectoris: Secondary | ICD-10-CM | POA: Diagnosis not present

## 2018-12-13 DIAGNOSIS — R1013 Epigastric pain: Secondary | ICD-10-CM | POA: Diagnosis not present

## 2018-12-13 DIAGNOSIS — D509 Iron deficiency anemia, unspecified: Secondary | ICD-10-CM | POA: Diagnosis not present

## 2018-12-14 ENCOUNTER — Emergency Department (HOSPITAL_COMMUNITY): Payer: Medicare PPO

## 2018-12-14 ENCOUNTER — Other Ambulatory Visit: Payer: Self-pay

## 2018-12-14 ENCOUNTER — Inpatient Hospital Stay (HOSPITAL_COMMUNITY)
Admission: EM | Admit: 2018-12-14 | Discharge: 2018-12-23 | DRG: 871 | Disposition: A | Discharge status: Home / Self Care | Payer: Medicare PPO | Attending: Internal Medicine | Admitting: Internal Medicine

## 2018-12-14 ENCOUNTER — Encounter (HOSPITAL_COMMUNITY): Payer: Self-pay

## 2018-12-14 DIAGNOSIS — R000 Tachycardia, unspecified: Secondary | ICD-10-CM | POA: Diagnosis not present

## 2018-12-14 DIAGNOSIS — R52 Pain, unspecified: Secondary | ICD-10-CM | POA: Diagnosis present

## 2018-12-14 DIAGNOSIS — R1033 Periumbilical pain: Secondary | ICD-10-CM | POA: Diagnosis not present

## 2018-12-14 DIAGNOSIS — N3000 Acute cystitis without hematuria: Secondary | ICD-10-CM | POA: Diagnosis not present

## 2018-12-14 DIAGNOSIS — N400 Enlarged prostate without lower urinary tract symptoms: Secondary | ICD-10-CM | POA: Diagnosis present

## 2018-12-14 DIAGNOSIS — R748 Abnormal levels of other serum enzymes: Secondary | ICD-10-CM

## 2018-12-14 DIAGNOSIS — R140 Abdominal distension (gaseous): Secondary | ICD-10-CM

## 2018-12-14 DIAGNOSIS — K851 Biliary acute pancreatitis without necrosis or infection: Secondary | ICD-10-CM | POA: Diagnosis present

## 2018-12-14 DIAGNOSIS — A419 Sepsis, unspecified organism: Secondary | ICD-10-CM | POA: Diagnosis not present

## 2018-12-14 DIAGNOSIS — R1084 Generalized abdominal pain: Secondary | ICD-10-CM | POA: Diagnosis not present

## 2018-12-14 DIAGNOSIS — I959 Hypotension, unspecified: Secondary | ICD-10-CM | POA: Diagnosis not present

## 2018-12-14 DIAGNOSIS — I4510 Unspecified right bundle-branch block: Secondary | ICD-10-CM | POA: Diagnosis not present

## 2018-12-14 DIAGNOSIS — K759 Inflammatory liver disease, unspecified: Secondary | ICD-10-CM | POA: Diagnosis not present

## 2018-12-14 DIAGNOSIS — R109 Unspecified abdominal pain: Secondary | ICD-10-CM

## 2018-12-14 DIAGNOSIS — N183 Chronic kidney disease, stage 3 unspecified: Secondary | ICD-10-CM | POA: Diagnosis present

## 2018-12-14 DIAGNOSIS — R630 Anorexia: Secondary | ICD-10-CM | POA: Diagnosis not present

## 2018-12-14 DIAGNOSIS — Z8546 Personal history of malignant neoplasm of prostate: Secondary | ICD-10-CM | POA: Diagnosis not present

## 2018-12-14 DIAGNOSIS — K8511 Biliary acute pancreatitis with uninfected necrosis: Secondary | ICD-10-CM | POA: Diagnosis not present

## 2018-12-14 DIAGNOSIS — N179 Acute kidney failure, unspecified: Secondary | ICD-10-CM | POA: Diagnosis present

## 2018-12-14 DIAGNOSIS — E785 Hyperlipidemia, unspecified: Secondary | ICD-10-CM | POA: Diagnosis present

## 2018-12-14 DIAGNOSIS — R197 Diarrhea, unspecified: Secondary | ICD-10-CM | POA: Diagnosis not present

## 2018-12-14 DIAGNOSIS — Z8249 Family history of ischemic heart disease and other diseases of the circulatory system: Secondary | ICD-10-CM | POA: Diagnosis not present

## 2018-12-14 DIAGNOSIS — K219 Gastro-esophageal reflux disease without esophagitis: Secondary | ICD-10-CM | POA: Diagnosis present

## 2018-12-14 DIAGNOSIS — I25118 Atherosclerotic heart disease of native coronary artery with other forms of angina pectoris: Secondary | ICD-10-CM | POA: Diagnosis not present

## 2018-12-14 DIAGNOSIS — Z7982 Long term (current) use of aspirin: Secondary | ICD-10-CM | POA: Diagnosis not present

## 2018-12-14 DIAGNOSIS — R945 Abnormal results of liver function studies: Secondary | ICD-10-CM | POA: Diagnosis not present

## 2018-12-14 DIAGNOSIS — I2510 Atherosclerotic heart disease of native coronary artery without angina pectoris: Secondary | ICD-10-CM | POA: Diagnosis present

## 2018-12-14 DIAGNOSIS — A4151 Sepsis due to Escherichia coli [E. coli]: Secondary | ICD-10-CM | POA: Diagnosis not present

## 2018-12-14 DIAGNOSIS — E1122 Type 2 diabetes mellitus with diabetic chronic kidney disease: Secondary | ICD-10-CM | POA: Diagnosis not present

## 2018-12-14 DIAGNOSIS — E1142 Type 2 diabetes mellitus with diabetic polyneuropathy: Secondary | ICD-10-CM | POA: Diagnosis present

## 2018-12-14 DIAGNOSIS — R634 Abnormal weight loss: Secondary | ICD-10-CM | POA: Diagnosis not present

## 2018-12-14 DIAGNOSIS — Z951 Presence of aortocoronary bypass graft: Secondary | ICD-10-CM | POA: Diagnosis not present

## 2018-12-14 DIAGNOSIS — K5190 Ulcerative colitis, unspecified, without complications: Secondary | ICD-10-CM | POA: Diagnosis not present

## 2018-12-14 DIAGNOSIS — K838 Other specified diseases of biliary tract: Secondary | ICD-10-CM | POA: Diagnosis not present

## 2018-12-14 DIAGNOSIS — K8050 Calculus of bile duct without cholangitis or cholecystitis without obstruction: Secondary | ICD-10-CM

## 2018-12-14 DIAGNOSIS — N390 Urinary tract infection, site not specified: Secondary | ICD-10-CM | POA: Diagnosis not present

## 2018-12-14 DIAGNOSIS — R740 Nonspecific elevation of levels of transaminase and lactic acid dehydrogenase [LDH]: Secondary | ICD-10-CM | POA: Diagnosis not present

## 2018-12-14 DIAGNOSIS — R42 Dizziness and giddiness: Secondary | ICD-10-CM | POA: Diagnosis not present

## 2018-12-14 DIAGNOSIS — E876 Hypokalemia: Secondary | ICD-10-CM | POA: Diagnosis not present

## 2018-12-14 DIAGNOSIS — Z9049 Acquired absence of other specified parts of digestive tract: Secondary | ICD-10-CM | POA: Diagnosis not present

## 2018-12-14 DIAGNOSIS — Z87891 Personal history of nicotine dependence: Secondary | ICD-10-CM

## 2018-12-14 DIAGNOSIS — I129 Hypertensive chronic kidney disease with stage 1 through stage 4 chronic kidney disease, or unspecified chronic kidney disease: Secondary | ICD-10-CM | POA: Diagnosis present

## 2018-12-14 DIAGNOSIS — Z833 Family history of diabetes mellitus: Secondary | ICD-10-CM

## 2018-12-14 DIAGNOSIS — R0602 Shortness of breath: Secondary | ICD-10-CM

## 2018-12-14 DIAGNOSIS — Z794 Long term (current) use of insulin: Secondary | ICD-10-CM | POA: Diagnosis not present

## 2018-12-14 DIAGNOSIS — R6520 Severe sepsis without septic shock: Secondary | ICD-10-CM | POA: Diagnosis not present

## 2018-12-14 DIAGNOSIS — R7989 Other specified abnormal findings of blood chemistry: Secondary | ICD-10-CM

## 2018-12-14 DIAGNOSIS — K8051 Calculus of bile duct without cholangitis or cholecystitis with obstruction: Secondary | ICD-10-CM | POA: Diagnosis not present

## 2018-12-14 DIAGNOSIS — R112 Nausea with vomiting, unspecified: Secondary | ICD-10-CM | POA: Diagnosis not present

## 2018-12-14 DIAGNOSIS — I10 Essential (primary) hypertension: Secondary | ICD-10-CM | POA: Diagnosis not present

## 2018-12-14 DIAGNOSIS — R0789 Other chest pain: Secondary | ICD-10-CM | POA: Diagnosis not present

## 2018-12-14 DIAGNOSIS — R1011 Right upper quadrant pain: Secondary | ICD-10-CM | POA: Diagnosis not present

## 2018-12-14 DIAGNOSIS — R509 Fever, unspecified: Secondary | ICD-10-CM | POA: Diagnosis not present

## 2018-12-14 DIAGNOSIS — R0600 Dyspnea, unspecified: Secondary | ICD-10-CM

## 2018-12-14 DIAGNOSIS — R932 Abnormal findings on diagnostic imaging of liver and biliary tract: Secondary | ICD-10-CM | POA: Diagnosis not present

## 2018-12-14 LAB — CBC WITH DIFFERENTIAL/PLATELET
Abs Immature Granulocytes: 0.14 10*3/uL — ABNORMAL HIGH (ref 0.00–0.07)
Basophils Absolute: 0 10*3/uL (ref 0.0–0.1)
Basophils Relative: 0 %
Eosinophils Absolute: 0 10*3/uL (ref 0.0–0.5)
Eosinophils Relative: 0 %
HCT: 41 % (ref 39.0–52.0)
Hemoglobin: 13.6 g/dL (ref 13.0–17.0)
Immature Granulocytes: 1 %
Lymphocytes Relative: 4 %
Lymphs Abs: 0.6 10*3/uL — ABNORMAL LOW (ref 0.7–4.0)
MCH: 28.2 pg (ref 26.0–34.0)
MCHC: 33.2 g/dL (ref 30.0–36.0)
MCV: 85.1 fL (ref 80.0–100.0)
Monocytes Absolute: 0.9 10*3/uL (ref 0.1–1.0)
Monocytes Relative: 5 %
Neutro Abs: 15.5 10*3/uL — ABNORMAL HIGH (ref 1.7–7.7)
Neutrophils Relative %: 90 %
Platelets: 239 10*3/uL (ref 150–400)
RBC: 4.82 MIL/uL (ref 4.22–5.81)
RDW: 14.2 % (ref 11.5–15.5)
WBC: 17.2 10*3/uL — ABNORMAL HIGH (ref 4.0–10.5)
nRBC: 0 % (ref 0.0–0.2)

## 2018-12-14 LAB — COMPREHENSIVE METABOLIC PANEL
ALT: 823 U/L — ABNORMAL HIGH (ref 0–44)
AST: 1099 U/L — ABNORMAL HIGH (ref 15–41)
Albumin: 2.9 g/dL — ABNORMAL LOW (ref 3.5–5.0)
Alkaline Phosphatase: 355 U/L — ABNORMAL HIGH (ref 38–126)
Anion gap: 11 (ref 5–15)
BUN: 29 mg/dL — ABNORMAL HIGH (ref 8–23)
CO2: 20 mmol/L — ABNORMAL LOW (ref 22–32)
Calcium: 9.4 mg/dL (ref 8.9–10.3)
Chloride: 107 mmol/L (ref 98–111)
Creatinine, Ser: 2.07 mg/dL — ABNORMAL HIGH (ref 0.61–1.24)
GFR calc Af Amer: 33 mL/min — ABNORMAL LOW (ref >60)
GFR calc non Af Amer: 29 mL/min — ABNORMAL LOW (ref >60)
Glucose, Bld: 182 mg/dL — ABNORMAL HIGH (ref 70–99)
Potassium: 3.5 mmol/L (ref 3.5–5.1)
Sodium: 138 mmol/L (ref 135–145)
Total Bilirubin: 2.6 mg/dL — ABNORMAL HIGH (ref 0.3–1.2)
Total Protein: 6.7 g/dL (ref 6.5–8.1)

## 2018-12-14 LAB — URINALYSIS, ROUTINE W REFLEX MICROSCOPIC
Glucose, UA: 100 mg/dL — AB
Ketones, ur: 15 mg/dL — AB
Nitrite: NEGATIVE
Protein, ur: 100 mg/dL — AB
Specific Gravity, Urine: 1.025 (ref 1.005–1.030)
pH: 5.5 (ref 5.0–8.0)

## 2018-12-14 LAB — LIPASE, BLOOD: Lipase: 35 U/L (ref 11–51)

## 2018-12-14 LAB — LACTIC ACID, PLASMA
Lactic Acid, Venous: 2.9 mmol/L (ref 0.5–1.9)
Lactic Acid, Venous: 3.2 mmol/L (ref 0.5–1.9)

## 2018-12-14 LAB — INFLUENZA PANEL BY PCR (TYPE A & B)
Influenza A By PCR: NEGATIVE
Influenza B By PCR: NEGATIVE

## 2018-12-14 LAB — PROTIME-INR
INR: 1.3 — ABNORMAL HIGH (ref 0.8–1.2)
Prothrombin Time: 15.7 seconds — ABNORMAL HIGH (ref 11.4–15.2)

## 2018-12-14 LAB — URINALYSIS, MICROSCOPIC (REFLEX): WBC, UA: >50 WBC/hpf (ref 0–5)

## 2018-12-14 LAB — ACETAMINOPHEN LEVEL: Acetaminophen (Tylenol), Serum: 15 ug/mL (ref 10–30)

## 2018-12-14 MED ORDER — DULOXETINE HCL 20 MG PO CPEP
40.0000 mg | ORAL_CAPSULE | Freq: Two times a day (BID) | ORAL | Status: DC
  Administered 2018-12-15 – 2018-12-23 (×17): 40 mg via ORAL
  Filled 2018-12-14 (×18): qty 2

## 2018-12-14 MED ORDER — SODIUM CHLORIDE 0.9 % IV SOLN
2.0000 g | INTRAVENOUS | Status: DC
  Administered 2018-12-14: 2 g via INTRAVENOUS
  Filled 2018-12-14: qty 20

## 2018-12-14 MED ORDER — CLONAZEPAM 0.5 MG PO TABS: 0.5000 mg | ORAL_TABLET | Freq: Every day | ORAL | Status: DC | PRN

## 2018-12-14 MED ORDER — PREGABALIN 25 MG PO CAPS
50.0000 mg | ORAL_CAPSULE | Freq: Three times a day (TID) | ORAL | Status: DC
  Administered 2018-12-15 – 2018-12-23 (×18): 50 mg via ORAL
  Filled 2018-12-14 (×25): qty 2

## 2018-12-14 MED ORDER — SODIUM CHLORIDE 0.9 % IV SOLN
500.0000 mg | INTRAVENOUS | Status: DC
  Administered 2018-12-14: 500 mg via INTRAVENOUS
  Filled 2018-12-14: qty 500

## 2018-12-14 MED ORDER — CILOSTAZOL 100 MG PO TABS
100.0000 mg | ORAL_TABLET | Freq: Two times a day (BID) | ORAL | Status: DC
  Administered 2018-12-15 – 2018-12-22 (×16): 100 mg via ORAL
  Filled 2018-12-14 (×18): qty 1

## 2018-12-14 MED ORDER — LUBIPROSTONE 24 MCG PO CAPS
24.0000 ug | ORAL_CAPSULE | Freq: Every day | ORAL | Status: DC
  Administered 2018-12-15 – 2018-12-22 (×7): 24 ug via ORAL
  Filled 2018-12-14 (×8): qty 1

## 2018-12-14 MED ORDER — HYDRALAZINE HCL 20 MG/ML IJ SOLN: 5.0000 mg | INTRAMUSCULAR | Status: DC | PRN

## 2018-12-14 MED ORDER — NITROGLYCERIN 0.4 MG SL SUBL: 0.4000 mg | SUBLINGUAL_TABLET | SUBLINGUAL | Status: DC | PRN

## 2018-12-14 MED ORDER — TAMSULOSIN HCL 0.4 MG PO CAPS: 0.4000 mg | ORAL_CAPSULE | Freq: Every day | ORAL | Status: DC

## 2018-12-14 MED ORDER — PIPERACILLIN-TAZOBACTAM 3.375 G IVPB
3.3750 g | Freq: Three times a day (TID) | INTRAVENOUS | Status: DC
  Administered 2018-12-15 – 2018-12-17 (×7): 3.375 g via INTRAVENOUS
  Filled 2018-12-14 (×7): qty 50

## 2018-12-14 MED ORDER — PIPERACILLIN-TAZOBACTAM 4.5 G IVPB: 4.5000 g | Freq: Once | INTRAVENOUS | Status: DC

## 2018-12-14 MED ORDER — VITAMIN D 25 MCG (1000 UNIT) PO TABS
1000.0000 [IU] | ORAL_TABLET | Freq: Every day | ORAL | Status: DC
  Administered 2018-12-15 – 2018-12-23 (×9): 1000 [IU] via ORAL
  Filled 2018-12-14 (×9): qty 1

## 2018-12-14 MED ORDER — PIPERACILLIN-TAZOBACTAM 3.375 G IVPB 30 MIN
3.3750 g | INTRAVENOUS | Status: AC
  Administered 2018-12-15: 3.375 g via INTRAVENOUS
  Filled 2018-12-14: qty 50

## 2018-12-14 MED ORDER — HEPARIN SODIUM (PORCINE) 5000 UNIT/ML IJ SOLN
5000.0000 [IU] | Freq: Three times a day (TID) | INTRAMUSCULAR | Status: DC
  Administered 2018-12-14 – 2018-12-20 (×18): 5000 [IU] via SUBCUTANEOUS
  Filled 2018-12-14 (×16): qty 1

## 2018-12-14 MED ORDER — SODIUM CHLORIDE 0.9 % IV SOLN
INTRAVENOUS | Status: DC
  Administered 2018-12-15 – 2018-12-23 (×14): via INTRAVENOUS

## 2018-12-14 MED ORDER — PANTOPRAZOLE SODIUM 40 MG PO TBEC
40.0000 mg | DELAYED_RELEASE_TABLET | Freq: Every day | ORAL | Status: DC
  Administered 2018-12-15 – 2018-12-23 (×9): 40 mg via ORAL
  Filled 2018-12-14 (×10): qty 1

## 2018-12-14 MED ORDER — LACTATED RINGERS IV BOLUS
1000.0000 mL | Freq: Once | INTRAVENOUS | Status: AC
  Administered 2018-12-14: 1000 mL via INTRAVENOUS

## 2018-12-14 MED ORDER — ASPIRIN EC 81 MG PO TBEC
81.0000 mg | DELAYED_RELEASE_TABLET | Freq: Every day | ORAL | Status: DC
  Administered 2018-12-15 – 2018-12-23 (×9): 81 mg via ORAL
  Filled 2018-12-14 (×9): qty 1

## 2018-12-14 MED ORDER — INSULIN ASPART 100 UNIT/ML ~~LOC~~ SOLN
0.0000 [IU] | Freq: Three times a day (TID) | SUBCUTANEOUS | Status: DC
  Administered 2018-12-15: 1 [IU] via SUBCUTANEOUS
  Administered 2018-12-16: 3 [IU] via SUBCUTANEOUS
  Administered 2018-12-16 – 2018-12-18 (×6): 1 [IU] via SUBCUTANEOUS
  Administered 2018-12-19 (×2): 2 [IU] via SUBCUTANEOUS
  Administered 2018-12-20: 1 [IU] via SUBCUTANEOUS
  Administered 2018-12-21: 2 [IU] via SUBCUTANEOUS
  Administered 2018-12-21: 0 [IU] via SUBCUTANEOUS
  Administered 2018-12-22: 1 [IU] via SUBCUTANEOUS
  Administered 2018-12-22 (×2): 2 [IU] via SUBCUTANEOUS

## 2018-12-14 MED ORDER — INSULIN GLARGINE 100 UNIT/ML ~~LOC~~ SOLN
10.0000 [IU] | Freq: Two times a day (BID) | SUBCUTANEOUS | Status: DC
  Administered 2018-12-15 – 2018-12-20 (×12): 10 [IU] via SUBCUTANEOUS
  Filled 2018-12-14 (×14): qty 0.1

## 2018-12-14 MED ORDER — SODIUM CHLORIDE 0.9 % IV BOLUS
500.0000 mL | Freq: Once | INTRAVENOUS | Status: AC
  Administered 2018-12-15: 500 mL via INTRAVENOUS

## 2018-12-14 MED ORDER — VITAMIN B-12 1000 MCG PO TABS
1000.0000 ug | ORAL_TABLET | Freq: Every day | ORAL | Status: DC
  Administered 2018-12-15 – 2018-12-23 (×9): 1000 ug via ORAL
  Filled 2018-12-14 (×9): qty 1

## 2018-12-14 MED ORDER — LACTATED RINGERS IV BOLUS
1000.0000 mL | Freq: Once | INTRAVENOUS | Status: AC
  Administered 2018-12-15: 1000 mL via INTRAVENOUS

## 2018-12-14 MED ORDER — TAMSULOSIN HCL 0.4 MG PO CAPS
0.4000 mg | ORAL_CAPSULE | Freq: Every day | ORAL | Status: DC
  Administered 2018-12-15 – 2018-12-23 (×9): 0.4 mg via ORAL
  Filled 2018-12-14 (×9): qty 1

## 2018-12-14 MED ORDER — ISOSORBIDE MONONITRATE ER 60 MG PO TB24
60.0000 mg | ORAL_TABLET | Freq: Every day | ORAL | Status: DC
  Administered 2018-12-15 – 2018-12-23 (×9): 60 mg via ORAL
  Filled 2018-12-14 (×9): qty 1

## 2018-12-14 NOTE — ED Provider Notes (Signed)
[facility] EMERGENCY DEPARTMENT Provider Note   CSN: [id number] Arrival date & time: 12/14/18  2048    History   Chief Complaint Chief Complaint  Patient presents with  . Nausea  . Emesis  . Hypotension    HPI Justin [name] is a 85 y.o. male.     The history is provided by the patient.  Abdominal Pain  Pain location:  Generalized Pain quality: aching and dull   Pain radiates to:  Does not radiate Pain severity:  Mild Onset quality:  Gradual Timing:  Intermittent Progression:  Waxing and waning Chronicity:  Recurrent Context: not alcohol use, not recent illness and not sick contacts   Relieved by:  Nothing Worsened by:  Nothing Associated symptoms: diarrhea, fever, nausea and vomiting   Associated symptoms: no anorexia, no belching, no chest pain, no chills, no cough, no dysuria, no hematuria, no melena, no shortness of breath and no sore throat   Risk factors comment:  Recent liver enzyme issues, CAD/DM   Past Medical History:  Diagnosis Date  . Anxiety   . Arthritis   . Coronary artery disease   . Diabetes mellitus without complication ([place])   . Diverticulitis   . Dyspnea   . GERD (gastroesophageal reflux disease)   . Hypercholesteremia   . Hypertension   . Pneumonia    as a child  . Prostate cancer ([place])   . UTI (lower urinary tract infection)     Patient Active Problem List   Diagnosis Date Noted  . CAD (coronary artery disease) 12/14/2018  . Acute renal failure superimposed on stage 3 chronic kidney disease ([place]) 12/14/2018  . UTI (urinary tract infection) 12/14/2018  . HLD (hyperlipidemia) 12/14/2018  . Sepsis ([place]) 12/14/2018  . Abnormal LFTs   . S/P CABG (coronary artery bypass graft) 12/06/2018  . Asymptomatic stenosis of right carotid artery 12/06/2018  . PAD (peripheral artery disease) ([place]) 12/06/2018  . Orthostatic hypotension 12/06/2018  . Elevated liver enzymes 12/06/2018  . Abdominal pain 10/12/2018  . Abnormal  liver function tests 10/12/2018  . Foreign body in stomach 10/12/2018  . Hypertension   . GERD (gastroesophageal reflux disease)   . Diabetes mellitus without complication ([place])   . Atherosclerosis of native coronary artery of native heart without angina pectoris     Past Surgical History:  Procedure Laterality Date  . ABDOMINAL SURGERY     for diverticulitis; also removed appendix  . CATARACT EXTRACTION    . CATARACT EXTRACTION, BILATERAL    . CHOLECYSTECTOMY N/A 10/12/2017   Procedure: LAPAROSCOPIC CHOLECYSTECTOMY;  Surgeon: [name], MD;  Location: [facility];  Service: General;  Laterality: N/A;  . CORONARY ARTERY BYPASS GRAFT  2009  . EYE SURGERY     "for bleeding in eye"  . HERNIA REPAIR    . LEFT HEART CATH AND CORONARY ANGIOGRAPHY N/A 11/04/2016   Procedure: Left Heart Cath and Coronary Angiography;  Surgeon: [name], MD;  Location: [facility];  Service: Cardiovascular;  Laterality: N/A;  . LOWER EXTREMITY ANGIOGRAPHY N/A 11/04/2016   Procedure: Lower Extremity Angiography;  Surgeon: [name], MD;  Location: [facility];  Service: Cardiovascular;  Laterality: N/A;  . PROSTATE SURGERY          Home Medications    Prior to Admission medications   Medication Sig Start Date End Date Taking? Authorizing Provider  acetaminophen (TYLENOL) 500 MG tablet Take 500 mg by mouth daily as needed for mild pain.   Yes  [provider]  AMITIZA 24 MCG capsule Take 24 mcg by mouth daily with breakfast.  10/24/16  Yes [provider]  aspirin EC 81 MG tablet Take 81 mg by mouth daily.   Yes [provider]  chlorthalidone (HYGROTON) 25 MG tablet Take 25 mg by mouth daily.   Yes [provider]  cholecalciferol (VITAMIN D3) 25 MCG (1000 UT) tablet Take 1,000 Units by mouth daily.   Yes [provider]  cilostazol (PLETAL) 100 MG tablet Take 1 tablet (100 mg total) by mouth 2 (two) times daily. 12/06/18  Yes Miquel Dunn, NP   clonazePAM (KLONOPIN) 0.5 MG tablet Take 0.5 mg by mouth daily as needed for anxiety.   Yes [provider]  DULoxetine HCl 40 MG CPEP Take 1 capsule by mouth 2 (two) times daily. 10/21/18  Yes [provider]  isosorbide mononitrate (IMDUR) 60 MG 24 hr tablet Take 60 mg by mouth daily.   Yes [provider]  JANUVIA 100 MG tablet Take 100 mg by mouth daily.  10/24/16  Yes [provider]  LANTUS SOLOSTAR 100 UNIT/ML Solostar Pen Inject 20 Units into the skin 2 (two) times daily.  09/10/16  Yes [provider]  metoprolol (LOPRESSOR) 50 MG tablet Take 50 mg by mouth 2 (two) times daily.   Yes [provider]  nitroGLYCERIN (NITROSTAT) 0.4 MG SL tablet Place 1 tablet (0.4 mg total) under the tongue every 5 (five) minutes as needed for chest pain. 12/06/18  Yes Miquel Dunn, NP  omeprazole (PRILOSEC) 20 MG capsule Take 20 mg by mouth daily.  11/22/18  Yes [provider]  pregabalin (LYRICA) 50 MG capsule Take 50 mg by mouth 3 (three) times daily.   Yes [provider]  simvastatin (ZOCOR) 20 MG tablet Take 20 mg by mouth daily.   Yes [provider]  tamsulosin (FLOMAX) 0.4 MG CAPS capsule Take 0.4 mg by mouth daily.   Yes [provider]  vitamin B-12 (CYANOCOBALAMIN) 1000 MCG tablet Take 1,000 mcg by mouth daily.   Yes [provider]    Family History Family History  Problem Relation Age of Onset  . Diabetes Father   . Diabetes Maternal Aunt   . Diabetes Maternal Uncle   . Diabetes Maternal Grandmother   . Heart disease Sister   . Diabetes Brother   . Heart disease Sister     Social History Social History   Tobacco Use  . Smoking status: Former Research scientist (life sciences)  . Smokeless tobacco: Never Used  Substance Use Topics  . Alcohol use: No  . Drug use: No     Allergies   Dye fdc red [red dye] and Ibuprofen   Review of Systems Review of Systems  Constitutional: Positive for fever.  Negative for chills.  HENT: Negative for ear pain and sore throat.   Eyes: Negative for pain and visual disturbance.  Respiratory: Negative for cough and shortness of breath.   Cardiovascular: Negative for chest pain and palpitations.  Gastrointestinal: Positive for abdominal pain, diarrhea, nausea and vomiting. Negative for anorexia and melena.  Genitourinary: Negative for dysuria and hematuria.  Musculoskeletal: Negative for arthralgias and back pain.  Skin: Negative for color change and rash.  Neurological: Negative for seizures and syncope.  All other systems reviewed and are negative.    Physical Exam Updated Vital Signs  ED Triage Vitals  Enc Vitals Group     BP 12/14/18 2054 (!) 105/50  Pulse Rate 12/14/18 2054 84     Resp 12/14/18 2054 (!) 24     Temp 12/14/18 2054 (!) 101.2 F (38.4 C)     Temp Source 12/14/18 2054 Oral     SpO2 12/14/18 2054 97 %     Weight 12/14/18 2110 170 lb (77.1 kg)     Height 12/14/18 2110 5\' 7"  (1.702 m)     Head Circumference --      Peak Flow --      Pain Score 12/14/18 2051 0     Pain Loc --      Pain Edu? --      Excl. in [place]? --     Physical Exam Vitals signs and nursing note reviewed.  Constitutional:      General: He is not in acute distress.    Appearance: He is well-developed. He is not ill-appearing.  HENT:     Head: Normocephalic and atraumatic.     Nose: Nose normal.     Mouth/Throat:     Mouth: Mucous membranes are moist.  Eyes:     Extraocular Movements: Extraocular movements intact.     Conjunctiva/sclera: Conjunctivae normal.     Pupils: Pupils are equal, round, and reactive to light.  Neck:     Musculoskeletal: Normal range of motion and neck supple.  Cardiovascular:     Rate and Rhythm: Normal rate and regular rhythm.     Pulses: Normal pulses.     Heart sounds: Normal heart sounds. No murmur.  Pulmonary:     Effort: Pulmonary effort is normal. No respiratory distress.     Breath sounds: Normal breath  sounds.  Abdominal:     General: There is no distension.     Palpations: Abdomen is soft.     Tenderness: There is abdominal tenderness. There is no guarding.  Musculoskeletal: Normal range of motion.     Right lower leg: No edema.     Left lower leg: No edema.  Skin:    General: Skin is warm and dry.     Capillary Refill: Capillary refill takes less than 2 seconds.  Neurological:     General: No focal deficit present.     Mental Status: He is alert.  Psychiatric:        Mood and Affect: Mood normal.      ED Treatments / Results  Labs (all labs ordered are listed, but only abnormal results are displayed) Labs Reviewed  LACTIC ACID, PLASMA - Abnormal; Notable for the following components:      Result Value   Lactic Acid, Venous 2.9 (*)    All other components within normal limits  LACTIC ACID, PLASMA - Abnormal; Notable for the following components:   Lactic Acid, Venous 3.2 (*)    All other components within normal limits  COMPREHENSIVE METABOLIC PANEL - Abnormal; Notable for the following components:   CO2 20 (*)    Glucose, Bld 182 (*)    BUN 29 (*)    Creatinine, Ser 2.07 (*)    Albumin 2.9 (*)    AST 1,099 (*)    ALT 823 (*)    Alkaline Phosphatase 355 (*)    Total Bilirubin 2.6 (*)    GFR calc non Af Amer 29 (*)    GFR calc Af Amer 33 (*)    All other components within normal limits  CBC WITH DIFFERENTIAL/PLATELET - Abnormal; Notable for the following components:   WBC 17.2 (*)    Neutro Abs  15.5 (*)    Lymphs Abs 0.6 (*)    Abs Immature Granulocytes 0.14 (*)    All other components within normal limits  URINALYSIS, ROUTINE W REFLEX MICROSCOPIC - Abnormal; Notable for the following components:   APPearance CLOUDY (*)    Glucose, UA 100 (*)    Hgb urine dipstick SMALL (*)    Bilirubin Urine MODERATE (*)    Ketones, ur 15 (*)    Protein, ur 100 (*)    Leukocytes,Ua MODERATE (*)    All other components within normal limits  URINALYSIS, MICROSCOPIC (REFLEX)  - Abnormal; Notable for the following components:   Bacteria, UA MANY (*)    All other components within normal limits  PROTIME-INR - Abnormal; Notable for the following components:   Prothrombin Time 15.7 (*)    INR 1.3 (*)    All other components within normal limits  CULTURE, BLOOD (ROUTINE X 2)  CULTURE, BLOOD (ROUTINE X 2)  URINE CULTURE  INFLUENZA PANEL BY PCR (TYPE A & B)  LIPASE, BLOOD  ACETAMINOPHEN LEVEL  ANTINUCLEAR ANTIBODIES, IFA  IGG  COMPREHENSIVE METABOLIC PANEL  CBC  LACTIC ACID, PLASMA  LACTIC ACID, PLASMA  PROCALCITONIN  APTT    EKG EKG Interpretation  Date/Time:  Tuesday December 14 2018 20:56:43 EDT Ventricular Rate:  87 PR Interval:    QRS Duration: 174 QT Interval:  440 QTC Calculation: 530 R Axis:   -42 Text Interpretation:  Sinus arrhythmia RBBB and LAFB Confirmed by Lennice Sites [phone]) on 12/14/2018 9:34:35 PM   Radiology Dg Chest Port 1 View  Result Date: 12/14/2018 CLINICAL DATA:  Nausea and vomiting with recent fever, initial encounter EXAM: PORTABLE CHEST 1 VIEW COMPARISON:  None. FINDINGS: Cardiac shadows within normal limits. Postsurgical changes are seen. The lungs are clear bilaterally. No acute bony abnormality is noted. IMPRESSION: No active disease. Electronically Signed   By: Inez Catalina M.D.   On: 12/14/2018 21:35   US Abdomen Limited Ruq  Result Date: 12/14/2018 CLINICAL DATA:  Pain, vomiting EXAM: ULTRASOUND ABDOMEN LIMITED RIGHT UPPER QUADRANT COMPARISON:  CT and ultrasound 10/12/2018 FINDINGS: Gallbladder: Prior cholecystectomy Common bile duct: Diameter: Normal caliber for post cholecystectomy state, 7 mm. Liver: No focal lesion identified. Within normal limits in parenchymal echogenicity. Portal vein is patent on color Doppler imaging with normal direction of blood flow towards the liver. IMPRESSION: Prior cholecystectomy.  No acute findings. Electronically Signed   By: Rolm Baptise M.D.   On: 12/14/2018 23:27    Procedures  .Critical Care Performed by: Lennice Sites, DO Authorized by: Lennice Sites, DO   Critical care provider statement:    Critical care time (minutes):  40   Critical care time was exclusive of:  Separately billable procedures and treating other patients and teaching time   Critical care was necessary to treat or prevent imminent or life-threatening deterioration of the following conditions:  Hepatic failure and sepsis   Critical care was time spent personally by me on the following activities:  Blood draw for specimens, development of treatment plan with patient or surrogate, discussions with consultants, discussions with primary provider, evaluation of patient's response to treatment, examination of patient, obtaining history from patient or surrogate, ordering and performing treatments and interventions, ordering and review of laboratory studies, ordering and review of radiographic studies, pulse oximetry, re-evaluation of patient's condition and review of old charts   I assumed direction of critical care for this patient from another provider in my specialty: no     (including  critical care time)  Medications Ordered in ED Medications  lactated ringers bolus 1,000 mL (has no administration in time range)  sodium chloride 0.9 % bolus 500 mL (has no administration in time range)  0.9 %  sodium chloride infusion (has no administration in time range)  aspirin EC tablet 81 mg (has no administration in time range)  isosorbide mononitrate (IMDUR) 24 hr tablet 60 mg (has no administration in time range)  nitroGLYCERIN (NITROSTAT) SL tablet 0.4 mg (has no administration in time range)  DULoxetine HCl CPEP 1 capsule (has no administration in time range)  Insulin Glargine (LANTUS) Solostar Pen 10 Units (has no administration in time range)  lubiprostone (AMITIZA) capsule 24 mcg (has no administration in time range)  pantoprazole (PROTONIX) EC tablet 40 mg (has no administration in time range)   cilostazol (PLETAL) tablet 100 mg (has no administration in time range)  vitamin B-12 (CYANOCOBALAMIN) tablet 1,000 mcg (has no administration in time range)  clonazePAM (KLONOPIN) tablet 0.5 mg (has no administration in time range)  pregabalin (LYRICA) capsule 50 mg (has no administration in time range)  cholecalciferol (VITAMIN D3) tablet 1,000 Units (has no administration in time range)  tamsulosin (FLOMAX) capsule 0.4 mg (has no administration in time range)  heparin injection 5,000 Units (has no administration in time range)  hydrALAZINE (APRESOLINE) injection 5 mg (has no administration in time range)  insulin aspart (novoLOG) injection 0-9 Units (has no administration in time range)  piperacillin-tazobactam (ZOSYN) IVPB 3.375 g (has no administration in time range)  piperacillin-tazobactam (ZOSYN) IVPB 3.375 g (has no administration in time range)  acetaminophen (TYLENOL) tablet 650 mg (has no administration in time range)  lactated ringers bolus 1,000 mL (0 mLs Intravenous Stopped 12/14/18 2228)     Initial Impression / Assessment and Plan / ED Course  I have reviewed the triage vital signs and the nursing notes.  Pertinent labs & imaging results that were available during my care of the patient were reviewed by me and considered in my medical decision making (see chart for details).     Thierry Dobosz is an 85 year old male with history of prostate cancer, hypertension, CAD who presents to the ED with abdominal pain, nausea, diarrhea, fever.  Patient with fever upon arrival.  Blood pressure in the 321 systolics.  Patient did have hypotension with EMS at 80 systolic.  He has already received 500 cc of fluid with EMS and blood pressure is stabilized.  He is not tachycardic.  Denies any cough, sputum production.  Denies any urinary symptoms.  Does have diffuse abdominal pain on exam.  He states that he has had this issue for several weeks.  Home health nurse noticed that he had a fever  and was told to come for evaluation.  Patient given history and physical and and vitals as made code sepsis.  Patient empirically given IV antibiotics, lactated Ringer bolus.  Blood cultures, urine studies, influenza testing, chest x-ray ordered.  Patient with transaminitis.  Patient with AST of 1100, ALT of 820, bilirubin of 2.6.  Leukocytosis of 17.2.  Lactic acid of 2.9.  However, creatinine at baseline.  Lipase normal.  INR mildly elevated at 1.3.  Chest x-ray showed no signs of pneumonia.  Urinalysis also concerning for infection.  Patient given IV Rocephin, Zithromax, IV Zosyn.  He does not appear to have any mental status change.  No concern for cholangitis at this time but he is already covered antibiotic-wise.  Contacted his gastroenterologist, Dr. Benson Norway, who recommends  repeat right upper quadrant ultrasound, ANA and IgG as concern for possible autoimmune hepatitis. He has already had an extensive outpatient work-up and recent inpatient work-up for the same elevation of liver enzymes that had improved.  He states that last liver enzymes were in the 200s.  Patient had unremarkable MRCP, right upper quadrant ultrasound 2 months ago.  Negative hepatitis panel.  Influenza testing negative.  Right upper quadrant ultrasound showed no acute findings.  Gallbladder and liver are within normal limits.  Tylenol level also pending.  GI will see the patient in the morning.  Patient remained hemodynamically stable throughout my care.  Was given additional lactated Ringer bolus.  Admitted to medicine for further care.  This chart was dictated using voice recognition software.  Despite best efforts to proofread,  errors can occur which can change the documentation meaning.    Final Clinical Impressions(s) / ED Diagnoses   Final diagnoses:  Pain  Sepsis, due to unspecified organism, unspecified whether acute organ dysfunction present [facility])  Acute cystitis without hematuria  Elevated liver enzymes    ED  Discharge Orders    None       Lennice Sites, DO 12/15/18 0024

## 2018-12-14 NOTE — H&P (Signed)
History and Physical    Treyven Lafauci ZYS:[id number] DOB: 1933-05-23 DOA: 12/14/2018  Referring MD/NP/PA:   PCP: [name], MD   Patient coming from:  The patient is coming from home.  At baseline, pt is independent for most of ADL.        Chief Complaint: Nausea vomiting, abdominal pain, increased urinary frequency  HPI: Justin [name] is a 85 y.o. male with medical history significant of hypertension, hyperlipidemia, diabetes mellitus, GERD, depression, anxiety, CAD, CABG, diverticulitis, prostate cancer, UTI, CKD stage III, who presents with nausea vomiting and abdominal pain, increased urinary frequency  Patient states he has been having abdominal pain past 2 days, which is located in the epigastric and right upper quadrant, intermittent, initially 10 out of 10 in severity, currently 4 out of 10 severity, sharp, nonradiating.  It is associated with nausea and vomited twice.  Also has fever and chills.  Patient was reportedly to have low blood pressure with SBP 80s which improved to 108/49 after giving 1 L of Ringer's solution in the ED.  Patient reports increased urinary frequency, but no dysuria or burning on urination.  Patient states that he has some mild chronic dry cough and mild shortness of breath which has not changed.  No chest pain.  No unilateral weakness.  Of note, patient was hospitalized on 1/14-1/17 due to right upper quadrant abdominal pain and abnormal liver function. In that admission, GI was consulted (Dr. Benson Norway). Pt had negative MRCP, U/S shows dilated CBD, negative hepatitis panel, GB removal in Jan 2019, gastric emptying study normal and heme occult negative.  No clear diagnosis was made.   ED Course: pt was found to have WBC 17.2, positive urinalysis (cloudy appearance, moderate amount of leukocyte, many bacteria, WBC 50), worsening renal function, abnormal liver function (ALP 355, AST 1099, ALT 822, total bilirubin 2.6), tylenol level<15,  lactic acid 2.9,  temperature well 1.2, oxygen saturation 95% on room air, negative chest x-ray. Patient is admitted to telemetry bed as inpatient.  GI, Dr. Benson Norway was consulted by EDP.  US-RUQ showed prior cholecystectomy.  No acute findings.  Review of Systems:   General: has fevers, chills, no body weight gain, has poor appetite, has fatigue HEENT: no blurry vision, hearing changes or sore throat Respiratory: no dyspnea, coughing, wheezing CV: no chest pain, no palpitations GI: has nausea, vomiting, abdominal pain, no diarrhea, constipation GU: no dysuria, burning on urination, increased urinary frequency, hematuria  Ext: no leg edema Neuro: no unilateral weakness, numbness, or tingling, no vision change or hearing loss Skin: no rash, no skin tear. MSK: No muscle spasm, no deformity, no limitation of range of movement in spin Heme: No easy bruising.  Travel history: No recent long distant travel.  Allergy:  Allergies  Allergen Reactions   Dye Fdc Red [Red Dye] Swelling and Other (See Comments)    CAT scan dye   Ibuprofen Other (See Comments)    Upset stomach     Past Medical History:  Diagnosis Date   Anxiety    Arthritis    Coronary artery disease    Diabetes mellitus without complication (HCC)    Diverticulitis    Dyspnea    GERD (gastroesophageal reflux disease)    Hypercholesteremia    Hypertension    Pneumonia    as a child   Prostate cancer ([place])    UTI (lower urinary tract infection)     Past Surgical History:  Procedure Laterality Date   ABDOMINAL SURGERY  for diverticulitis; also removed appendix   CATARACT EXTRACTION     CATARACT EXTRACTION, BILATERAL     CHOLECYSTECTOMY N/A 10/12/2017   Procedure: LAPAROSCOPIC CHOLECYSTECTOMY;  Surgeon: [name], MD;  Location: [facility];  Service: General;  Laterality: N/A;   CORONARY ARTERY BYPASS GRAFT  2009   EYE SURGERY     "for bleeding in eye"   HERNIA REPAIR     LEFT HEART CATH AND CORONARY  ANGIOGRAPHY N/A 11/04/2016   Procedure: Left Heart Cath and Coronary Angiography;  Surgeon: [name], MD;  Location: [facility];  Service: Cardiovascular;  Laterality: N/A;   LOWER EXTREMITY ANGIOGRAPHY N/A 11/04/2016   Procedure: Lower Extremity Angiography;  Surgeon: [name], MD;  Location: [facility];  Service: Cardiovascular;  Laterality: N/A;   PROSTATE SURGERY      Social History:  reports that he has quit smoking. He has never used smokeless tobacco. He reports that he does not drink alcohol or use drugs.  Family History:  Family History  Problem Relation Age of Onset   Diabetes Father    Diabetes Maternal Aunt    Diabetes Maternal Uncle    Diabetes Maternal Grandmother    Heart disease Sister    Diabetes Brother    Heart disease Sister      Prior to Admission medications   Medication Sig Start Date End Date Taking? Authorizing Provider  acetaminophen (TYLENOL) 500 MG tablet Take 500 mg by mouth daily as needed for mild pain.   Yes [provider]  AMITIZA 24 MCG capsule Take 24 mcg by mouth daily with breakfast.  10/24/16  Yes [provider]  aspirin EC 81 MG tablet Take 81 mg by mouth daily.   Yes [provider]  chlorthalidone (HYGROTON) 25 MG tablet Take 25 mg by mouth daily.   Yes [provider]  cholecalciferol (VITAMIN D3) 25 MCG (1000 UT) tablet Take 1,000 Units by mouth daily.   Yes [provider]  cilostazol (PLETAL) 100 MG tablet Take 1 tablet (100 mg total) by mouth 2 (two) times daily. 12/06/18  Yes Miquel Dunn, NP  clonazePAM (KLONOPIN) 0.5 MG tablet Take 0.5 mg by mouth daily as needed for anxiety.   Yes [provider]  DULoxetine HCl 40 MG CPEP Take 1 capsule by mouth 2 (two) times daily. 10/21/18  Yes [provider]  isosorbide mononitrate (IMDUR) 60 MG 24 hr tablet Take 60 mg by mouth daily.   Yes [provider]  JANUVIA 100 MG tablet Take 100 mg by  mouth daily.  10/24/16  Yes [provider]  LANTUS SOLOSTAR 100 UNIT/ML Solostar Pen Inject 20 Units into the skin 2 (two) times daily.  09/10/16  Yes [provider]  metoprolol (LOPRESSOR) 50 MG tablet Take 50 mg by mouth 2 (two) times daily.   Yes [provider]  nitroGLYCERIN (NITROSTAT) 0.4 MG SL tablet Place 1 tablet (0.4 mg total) under the tongue every 5 (five) minutes as needed for chest pain. 12/06/18  Yes Miquel Dunn, NP  omeprazole (PRILOSEC) 20 MG capsule Take 20 mg by mouth daily.  11/22/18  Yes [provider]  pregabalin (LYRICA) 50 MG capsule Take 50 mg by mouth 3 (three) times daily.   Yes [provider]  simvastatin (ZOCOR) 20 MG tablet Take 20 mg by mouth daily.   Yes [provider]  tamsulosin (FLOMAX) 0.4 MG CAPS capsule Take 0.4 mg by mouth daily.  Yes [provider]  vitamin B-12 (CYANOCOBALAMIN) 1000 MCG tablet Take 1,000 mcg by mouth daily.   Yes [provider]    Physical Exam: Vitals:   12/14/18 2200 12/14/18 2215 12/14/18 2230 12/14/18 2245  BP: (!) 118/48 (!) 108/49 (!) 109/50 (!) 104/50  Pulse: 75 74 68 69  Resp: (!) 29 (!) 28 (!) 28 (!) 24  Temp:      TempSrc:      SpO2: 95% 99% 99% 99%  Weight:      Height:       General: Not in acute distress HEENT:       Eyes: PERRL, EOMI, no scleral icterus.       ENT: No discharge from the ears and nose, no pharynx injection, no tonsillar enlargement.        Neck: No JVD, no bruit, no mass felt. Heme: No neck lymph node enlargement. Cardiac: S1/S2, RRR, No murmurs, No gallops or rubs. Respiratory: No rales, wheezing, rhonchi or rubs. GI: Soft, nondistended,  Has tenderness in RUQ and epigastric area, no rebound pain, no organomegaly, BS present. GU: No hematuria Ext: No pitting leg edema bilaterally. 2+DP/PT pulse bilaterally. Musculoskeletal: No joint deformities, No joint redness or warmth, no limitation of ROM in spin. Skin:  No rashes.  Neuro: Alert, oriented X3, cranial nerves II-XII grossly intact, moves all extremities normally. Marland Kitchen Psych: Patient is not psychotic, no suicidal or hemocidal ideation.  Labs on Admission: I have personally reviewed following labs and imaging studies  CBC: Recent Labs  Lab 12/14/18 2114  WBC 17.2*  NEUTROABS 15.5*  HGB 13.6  HCT 41.0  MCV 85.1  PLT 854   Basic Metabolic Panel: Recent Labs  Lab 12/14/18 2114  NA 138  K 3.5  CL 107  CO2 20*  GLUCOSE 182*  BUN 29*  CREATININE 2.07*  CALCIUM 9.4   GFR: Estimated Creatinine Clearance: 25.3 mL/min (A) (by C-G formula based on SCr of 2.07 mg/dL (H)). Liver Function Tests: Recent Labs  Lab 12/14/18 2114  AST 1,099*  ALT 823*  ALKPHOS 355*  BILITOT 2.6*  PROT 6.7  ALBUMIN 2.9*   Recent Labs  Lab 12/14/18 2228  LIPASE 35   No results for input(s): AMMONIA in the last 168 hours. Coagulation Profile: Recent Labs  Lab 12/14/18 2228  INR 1.3*   Cardiac Enzymes: No results for input(s): CKTOTAL, CKMB, CKMBINDEX, TROPONINI in the last 168 hours. BNP (last 3 results) No results for input(s): PROBNP in the last 8760 hours. HbA1C: No results for input(s): HGBA1C in the last 72 hours. CBG: No results for input(s): GLUCAP in the last 168 hours. Lipid Profile: No results for input(s): CHOL, HDL, LDLCALC, TRIG, CHOLHDL, LDLDIRECT in the last 72 hours. Thyroid Function Tests: No results for input(s): TSH, T4TOTAL, FREET4, T3FREE, THYROIDAB in the last 72 hours. Anemia Panel: No results for input(s): VITAMINB12, FOLATE, FERRITIN, TIBC, IRON, RETICCTPCT in the last 72 hours. Urine analysis:    Component Value Date/Time   COLORURINE YELLOW 12/14/2018 2114   APPEARANCEUR CLOUDY (A) 12/14/2018 2114   LABSPEC 1.025 12/14/2018 2114   PHURINE 5.5 12/14/2018 2114   GLUCOSEU 100 (A) 12/14/2018 2114   HGBUR SMALL (A) 12/14/2018 2114   BILIRUBINUR MODERATE (A) 12/14/2018 2114   KETONESUR 15 (A) 12/14/2018 2114    PROTEINUR 100 (A) 12/14/2018 2114   NITRITE NEGATIVE 12/14/2018 2114   LEUKOCYTESUR MODERATE (A) 12/14/2018 2114   Sepsis Labs: @LABRCNTIP (procalcitonin:4,lacticidven:4) )No results found for this or any previous visit (  from the past 240 hour(s)).   Radiological Exams on Admission: Dg Chest Port 1 View  Result Date: 12/14/2018 CLINICAL DATA:  Nausea and vomiting with recent fever, initial encounter EXAM: PORTABLE CHEST 1 VIEW COMPARISON:  None. FINDINGS: Cardiac shadows within normal limits. Postsurgical changes are seen. The lungs are clear bilaterally. No acute bony abnormality is noted. IMPRESSION: No active disease. Electronically Signed   By: Inez Catalina M.D.   On: 12/14/2018 21:35   US Abdomen Limited Ruq  Result Date: 12/14/2018 CLINICAL DATA:  Pain, vomiting EXAM: ULTRASOUND ABDOMEN LIMITED RIGHT UPPER QUADRANT COMPARISON:  CT and ultrasound 10/12/2018 FINDINGS: Gallbladder: Prior cholecystectomy Common bile duct: Diameter: Normal caliber for post cholecystectomy state, 7 mm. Liver: No focal lesion identified. Within normal limits in parenchymal echogenicity. Portal vein is patent on color Doppler imaging with normal direction of blood flow towards the liver. IMPRESSION: Prior cholecystectomy.  No acute findings. Electronically Signed   By: Rolm Baptise M.D.   On: 12/14/2018 23:27     EKG: Independently reviewed.  Sinus rhythm, QTC 530, bifascicular block   Assessment/Plan Principal Problem:   Sepsis ([place]) Active Problems:   Hypertension   GERD (gastroesophageal reflux disease)   S/P CABG (coronary artery bypass graft)   CAD (coronary artery disease)   Acute renal failure superimposed on stage 3 chronic kidney disease (HCC)   UTI (urinary tract infection)   HLD (hyperlipidemia)   Abnormal LFTs   Sepsis: Patient has sepsis with leukocytosis, fever, tachypnea and hypotension.  Blood pressure responded to IV fluid resuscitation.  Currently blood pressures of 108/49.  The  potential source of infection includes UTI given positive urinalysis and positive biliary system infection given abnormal liver function.  -Admitted to telemetry bed as inpatient - IV Zosyn (patient also received 1 dose of Rocephin and azithromycin in ED) --will get Procalcitonin and trend lactic acid levels per sepsis protocol. -IVF: 2L of ringer solution, 500 cc of NS bolus, followed by 125 cc/h  - f/u Bx and Ux  UTI: -on Zosyn IV -f/u Urine culture  Abnormal liver function: Etiology is not clear.  In previous admission, patient had extensive work-up. No clear diagnosis was made. Pt had negative MRCP, U/S shows dilated CBD, negative hepatitis panel, GB removal in Jan 2019, gastric emptying study normal and heme occult negative. Dr. Benson Norway of GI was consulted by EDP, recommended to check IgG and ANA due to suspicions of autoimmune hepatitis.  Tylenol level normal. -Follow-up GI recommendations -hold zocor  Hypertension:   -hold metoprolol and Hygroton due to hypotension and sepsis -IV hydralazine as needed if blood pressure is elevated  GERD (gastroesophageal reflux disease): -Protonix  Hx of CAD: S/P CABG (coronary artery bypass graft). No Cp -Continue aspirin, Imdur -prn NTG -hold zocor due to abnormal liver function  AoCKD-III: Baseline Cre is  1.3-1.7, pt's Cre is 2.07 and BUN 29 on admission. Likely due to UTI and dehydration and continuation of diuretics - IVF as above - Follow up renal function by BMP - Hold Hygroton  HLD (hyperlipidemia): -hold zocor due to abnormal liver function     Inpatient status:  # Patient requires inpatient status due to high intensity of service, high risk for further deterioration and high frequency of surveillance required.  I certify that at the point of admission it is my clinical judgment that the patient will require inpatient hospital care spanning beyond 2 midnights from the point of admission.   This patient has multiple chronic  comorbidities including hypertension,  hyperlipidemia, diabetes mellitus, GERD, depression, anxiety, CAD, CABG, diverticulitis, prostate cancer, UTI, CKD stage III, who presents with nausea vomiting and abdominal pain, increased urinary frequency  Now patient has presenting with nausea, vomiting and abdominal pain, abnormal liver function, UTI, sepsis  The worrisome physical exam findings include hypotension, fever, abdominal tenderness in epigastric and RUQ  The initial radiographic and laboratory data are worrisome because of abnormal liver function, positive urinalysis for UTI, elevated lactic acid, sepsis, worsening renal function  Current medical needs: please see my assessment and plan  Predictability of an adverse outcome (risk): Patient has multiple comorbidities, now presents with abnormal liver function, UTI and sepsis.  Patient had extensive work-up for abnormal liver function in the past without clear diagnosis made.  Patient will need further work-up with GI consultation.  Patient is at high risk for deteriorating.  Will need to be treated in hospital for at least 2 days.     DVT ppx: SQ Heparin     Code Status: Full code Family Communication:     Yes, patient's granddaughter at bed side Disposition Plan:  Anticipate discharge back to previous home environment Consults called: None Admission status:   Inpatient/tele     Date of Service 12/15/2018    [facility]   If 7PM-7AM, please contact night-coverage [email or web address] Password [facility] 12/15/2018, 12:17 AM

## 2018-12-14 NOTE — Progress Notes (Signed)
Pharmacy Antibiotic Note  Justin [name] is a 85 y.o. male admitted on 12/14/2018 with sepsis due to UTI and possible biliary system infection.  Pharmacy has been consulted for Zosyn dosing.  Plan: Zosyn 3.375gm IV now over 30 min then 3.375gm IV q8h - subsequent doses over 4 hours Will f/u micro data, renal function, and pt's clinical condition    Height: 5\' 7"  (170.2 cm) Weight: 170 lb (77.1 kg) IBW/kg (Calculated) : 66.1  Temp (24hrs), Avg:101.2 F (38.4 C), Min:101.2 F (38.4 C), Max:101.2 F (38.4 C)  Recent Labs  Lab 12/14/18 2114  WBC 17.2*  CREATININE 2.07*  LATICACIDVEN 2.9*    Estimated Creatinine Clearance: 25.3 mL/min (A) (by C-G formula based on SCr of 2.07 mg/dL (H)).    Allergies  Allergen Reactions  . Dye Fdc Red [Red Dye] Swelling and Other (See Comments)    CAT scan dye  . Ibuprofen Other (See Comments)    Upset stomach     Antimicrobials this admission: 3/17 Zosyn >>   Microbiology results: 3/17 BCx:   UCx:   Thank you for allowing pharmacy to be a part of this patient's care.  Sherlon Handing, PharmD, BCPS Clinical pharmacist  **Pharmacist phone directory can now be found on [email or web address] ([email or web address]).  Listed under [place]. 12/14/2018 11:45 PM

## 2018-12-14 NOTE — ED Notes (Signed)
ED Provider at bedside. 

## 2018-12-14 NOTE — ED Triage Notes (Signed)
Pt from home with ems for n/v/d 2 days ago. Worsening today, onset of fever 100.7, took tylenol at home. Denies any CP. EKG shows RBBB. Pt alert upon arrival  Orthostatic BP changes with dizziness.  BP 83/55 P114 CBG 200 20G LAC 4mg  zofran 58ml saline

## 2018-12-14 NOTE — ED Notes (Signed)
Patient transported to US 

## 2018-12-15 DIAGNOSIS — Z951 Presence of aortocoronary bypass graft: Secondary | ICD-10-CM

## 2018-12-15 DIAGNOSIS — E785 Hyperlipidemia, unspecified: Secondary | ICD-10-CM

## 2018-12-15 DIAGNOSIS — I25118 Atherosclerotic heart disease of native coronary artery with other forms of angina pectoris: Secondary | ICD-10-CM

## 2018-12-15 LAB — COMPREHENSIVE METABOLIC PANEL
ALT: 651 U/L — ABNORMAL HIGH (ref 0–44)
AST: 665 U/L — ABNORMAL HIGH (ref 15–41)
Albumin: 2.3 g/dL — ABNORMAL LOW (ref 3.5–5.0)
Alkaline Phosphatase: 291 U/L — ABNORMAL HIGH (ref 38–126)
Anion gap: 9 (ref 5–15)
BUN: 27 mg/dL — ABNORMAL HIGH (ref 8–23)
CO2: 22 mmol/L (ref 22–32)
Calcium: 8.6 mg/dL — ABNORMAL LOW (ref 8.9–10.3)
Chloride: 107 mmol/L (ref 98–111)
Creatinine, Ser: 1.96 mg/dL — ABNORMAL HIGH (ref 0.61–1.24)
GFR calc Af Amer: 36 mL/min — ABNORMAL LOW (ref >60)
GFR calc non Af Amer: 31 mL/min — ABNORMAL LOW (ref >60)
Glucose, Bld: 168 mg/dL — ABNORMAL HIGH (ref 70–99)
Potassium: 3.4 mmol/L — ABNORMAL LOW (ref 3.5–5.1)
Sodium: 138 mmol/L (ref 135–145)
Total Bilirubin: 2.7 mg/dL — ABNORMAL HIGH (ref 0.3–1.2)
Total Protein: 5.4 g/dL — ABNORMAL LOW (ref 6.5–8.1)

## 2018-12-15 LAB — BLOOD CULTURE ID PANEL (REFLEXED)

## 2018-12-15 LAB — CBC
HCT: 33.9 % — ABNORMAL LOW (ref 39.0–52.0)
Hemoglobin: 10.9 g/dL — ABNORMAL LOW (ref 13.0–17.0)
MCH: 27.3 pg (ref 26.0–34.0)
MCHC: 32.2 g/dL (ref 30.0–36.0)
MCV: 84.8 fL (ref 80.0–100.0)
Platelets: 193 10*3/uL (ref 150–400)
RBC: 4 MIL/uL — ABNORMAL LOW (ref 4.22–5.81)
RDW: 14.3 % (ref 11.5–15.5)
WBC: 16.4 10*3/uL — ABNORMAL HIGH (ref 4.0–10.5)
nRBC: 0 % (ref 0.0–0.2)

## 2018-12-15 LAB — GLUCOSE, CAPILLARY
Glucose-Capillary: 128 mg/dL — ABNORMAL HIGH (ref 70–99)
Glucose-Capillary: 121 mg/dL — ABNORMAL HIGH (ref 70–99)
Glucose-Capillary: 112 mg/dL — ABNORMAL HIGH (ref 70–99)

## 2018-12-15 LAB — PROCALCITONIN: Procalcitonin: 20.13 ng/mL

## 2018-12-15 LAB — LACTIC ACID, PLASMA: Lactic Acid, Venous: 1.6 mmol/L (ref 0.5–1.9)

## 2018-12-15 LAB — APTT: aPTT: 33 seconds (ref 24–36)

## 2018-12-15 MED ORDER — POTASSIUM CHLORIDE 10 MEQ/100ML IV SOLN
10.0000 meq | INTRAVENOUS | Status: AC
  Administered 2018-12-15 (×4): 10 meq via INTRAVENOUS
  Filled 2018-12-15 (×4): qty 100

## 2018-12-15 MED ORDER — ACETAMINOPHEN 325 MG PO TABS: 650.0000 mg | ORAL_TABLET | Freq: Four times a day (QID) | ORAL | Status: DC | PRN

## 2018-12-15 NOTE — Progress Notes (Signed)
PROGRESS NOTE    Justin [name]  QIH:[id number] DOB: 01/26/1933 DOA: 12/14/2018 PCP: [name], MD   Brief Narrative:  85 year old with history of CAD status post CABG, hyperlipidemia, GERD, essential hypertension came to the hospital with complaints of upper abdominal pain, nausea and vomiting.  He was admitted for similar reason about 2 months ago and underwent extensive work-up including MRCP, ultrasound which showed dilated CBD.  Acute hepatitis panel was negative at that time.  Gallbladder stone was removed.  Upon admission he was noted to have significantly elevated LFTs with elevated WBC.  He was started on Zosyn and admitted to the hospital.  Autoimmune hepatitis was suspected therefore ANA levels were sent.   Assessment & Plan:   Principal Problem:   Sepsis ([place]) Active Problems:   Hypertension   GERD (gastroesophageal reflux disease)   S/P CABG (coronary artery bypass graft)   CAD (coronary artery disease)   Acute renal failure superimposed on stage 3 chronic kidney disease (HCC)   UTI (urinary tract infection)   HLD (hyperlipidemia)   Abnormal LFTs  Acute hepatitis with elevated LFTs, unknown etiology Fever, unknown etiology - LFTs have slowly trended down.  Previously extensive work-up has been negative -Follow-up IgG and ANA levels -GI following.  Supportive care. -Procalcitonin levels 20.  WBC 16. -Right upper quadrant ultrasound-history of cholecystectomy, no other acute abnormality.  History of coronary artery disease status post CABG -Currently chest pain-free.  Continue aspirin and Imdur.  Statin on hold due to abnormal LFTs  Essential hypertension -IV hydralazine PRN.  Antihypertensives on hold due to initial presentation with low blood pressure and concerns for sepsis.  Acute kidney injury on CKD stage III -Admission creatinine 2.0, baseline 1.5.  With IV fluids it has trended down to 1.96.  Avoid nephrotoxic drugs, gentle hydration.  Diabetes  mellitus type 2, insulin-dependent -On Lantus 10 units twice daily, insulin sliding scale and Accu-Chek.  Peripheral neuropathy secondary to diabetes mellitus type 2 -On Lyrica 50 mg 3 times daily  BPH -Continue Flomax  DVT prophylaxis: Full code Code Status: Full code Family Communication: None at bedside Disposition Plan: To be determined  Consultants:   Gastroenterology  Procedures:   None  Antimicrobials:   Zosyn day 2   Subjective: Patient states abdominal pain is better.  Does not have any complaints at this time.  Remains afebrile since the time of admission.  Admission temperature was 101..2  Review of Systems Otherwise negative except as per HPI, including: General: Denies fever, chills, night sweats or unintended weight loss. Resp: Denies cough, wheezing, shortness of breath. Cardiac: Denies chest pain, palpitations, orthopnea, paroxysmal nocturnal dyspnea. GI: Denies abdominal pain, nausea, vomiting, diarrhea or constipation GU: Denies dysuria, frequency, hesitancy or incontinence MS: Denies muscle aches, joint pain or swelling Neuro: Denies headache, neurologic deficits (focal weakness, numbness, tingling), abnormal gait Psych: Denies anxiety, depression, SI/HI/AVH Skin: Denies new rashes or lesions ID: Denies sick contacts, exotic exposures, travel  Objective: Vitals:   12/14/18 2230 12/14/18 2245 12/15/18 0117 12/15/18 0525  BP: (!) 109/50 (!) 104/50 (!) 117/46 (!) 118/37  Pulse: 68 69 71 68  Resp: (!) 28 (!) 24 17 18   Temp:   98.6 F (37 C) 98.2 F (36.8 C)  TempSrc:   Oral Oral  SpO2: 99% 99% 100% 99%  Weight:   80.5 kg   Height:   5\' 7"  (1.702 m)     Intake/Output Summary (Last 24 hours) at 12/15/2018 1250 Last data filed at 12/15/2018 0836 Gross per  24 hour  Intake -  Output 200 ml  Net -200 ml   Filed Weights   12/14/18 2110 12/15/18 0117  Weight: 77.1 kg 80.5 kg    Examination:  General exam: Appears calm and comfortable   Respiratory system: Clear to auscultation. Respiratory effort normal. Cardiovascular system: S1 & S2 heard, RRR. No JVD, murmurs, rubs, gallops or clicks. No pedal edema. Gastrointestinal system: Abdomen is nondistended, soft and nontender. No organomegaly or masses felt. Normal bowel sounds heard. Central nervous system: Alert and oriented. No focal neurological deficits. Extremities: Symmetric 5 x 5 power. Skin: No rashes, lesions or ulcers Psychiatry: Judgement and insight appear normal. Mood & affect appropriate.     Data Reviewed:   CBC: Recent Labs  Lab 12/14/18 2114 12/15/18 0208  WBC 17.2* 16.4*  NEUTROABS 15.5*  --   HGB 13.6 10.9*  HCT 41.0 33.9*  MCV 85.1 84.8  PLT 239 144   Basic Metabolic Panel: Recent Labs  Lab 12/14/18 2114 12/15/18 0208  NA 138 138  K 3.5 3.4*  CL 107 107  CO2 20* 22  GLUCOSE 182* 168*  BUN 29* 27*  CREATININE 2.07* 1.96*  CALCIUM 9.4 8.6*   GFR: Estimated Creatinine Clearance: 29 mL/min (A) (by C-G formula based on SCr of 1.96 mg/dL (H)). Liver Function Tests: Recent Labs  Lab 12/14/18 2114 12/15/18 0208  AST 1,099* 665*  ALT 823* 651*  ALKPHOS 355* 291*  BILITOT 2.6* 2.7*  PROT 6.7 5.4*  ALBUMIN 2.9* 2.3*   Recent Labs  Lab 12/14/18 2228  LIPASE 35   No results for input(s): AMMONIA in the last 168 hours. Coagulation Profile: Recent Labs  Lab 12/14/18 2228  INR 1.3*   Cardiac Enzymes: No results for input(s): CKTOTAL, CKMB, CKMBINDEX, TROPONINI in the last 168 hours. BNP (last 3 results) No results for input(s): PROBNP in the last 8760 hours. HbA1C: No results for input(s): HGBA1C in the last 72 hours. CBG: Recent Labs  Lab 12/15/18 0834  GLUCAP 128*   Lipid Profile: No results for input(s): CHOL, HDL, LDLCALC, TRIG, CHOLHDL, LDLDIRECT in the last 72 hours. Thyroid Function Tests: No results for input(s): TSH, T4TOTAL, FREET4, T3FREE, THYROIDAB in the last 72 hours. Anemia Panel: No results for  input(s): VITAMINB12, FOLATE, FERRITIN, TIBC, IRON, RETICCTPCT in the last 72 hours. Sepsis Labs: Recent Labs  Lab 12/14/18 2114 12/14/18 2309 12/15/18 0208  PROCALCITON  --   --  20.13  LATICACIDVEN 2.9* 3.2* 1.6    Recent Results (from the past 240 hour(s))  Blood Culture (routine x 2)     Status: None (Preliminary result)   Collection Time: 12/14/18  9:00 PM  Result Value Ref Range Status   Specimen Description BLOOD RIGHT ARM  Final   Special Requests   Final    BOTTLES DRAWN AEROBIC AND ANAEROBIC Blood Culture results may not be optimal due to an excessive volume of blood received in culture bottles   Culture   Final    NO GROWTH < 12 HOURS Performed at [facility] [address], [place], [place] [address]    Report Status PENDING  Incomplete  Blood Culture (routine x 2)     Status: None (Preliminary result)   Collection Time: 12/14/18  9:15 PM  Result Value Ref Range Status   Specimen Description BLOOD RIGHT HAND  Final   Special Requests   Final    BOTTLES DRAWN AEROBIC ONLY Blood Culture results may not be optimal due to an  excessive volume of blood received in culture bottles   Culture   Final    NO GROWTH < 12 HOURS Performed at [facility] [address] [place], [place] [address]    Report Status PENDING  Incomplete         Radiology Studies: Dg Chest Port 1 View  Result Date: 12/14/2018 CLINICAL DATA:  Nausea and vomiting with recent fever, initial encounter EXAM: PORTABLE CHEST 1 VIEW COMPARISON:  None. FINDINGS: Cardiac shadows within normal limits. Postsurgical changes are seen. The lungs are clear bilaterally. No acute bony abnormality is noted. IMPRESSION: No active disease. Electronically Signed   By: Inez Catalina M.D.   On: 12/14/2018 21:35   US Abdomen Limited Ruq  Result Date: 12/14/2018 CLINICAL DATA:  Pain, vomiting EXAM: ULTRASOUND ABDOMEN LIMITED RIGHT UPPER QUADRANT COMPARISON:  CT and ultrasound 10/12/2018 FINDINGS:  Gallbladder: Prior cholecystectomy Common bile duct: Diameter: Normal caliber for post cholecystectomy state, 7 mm. Liver: No focal lesion identified. Within normal limits in parenchymal echogenicity. Portal vein is patent on color Doppler imaging with normal direction of blood flow towards the liver. IMPRESSION: Prior cholecystectomy.  No acute findings. Electronically Signed   By: Rolm Baptise M.D.   On: 12/14/2018 23:27        Scheduled Meds: . aspirin EC  81 mg Oral Daily  . cholecalciferol  1,000 Units Oral Daily  . cilostazol  100 mg Oral BID  . DULoxetine  40 mg Oral BID  . heparin  5,000 Units Subcutaneous Q8H  . insulin aspart  0-9 Units Subcutaneous TID WC  . insulin glargine  10 Units Subcutaneous BID  . isosorbide mononitrate  60 mg Oral Daily  . lubiprostone  24 mcg Oral Q breakfast  . pantoprazole  40 mg Oral Daily  . pregabalin  50 mg Oral TID  . tamsulosin  0.4 mg Oral Daily  . vitamin B-12  1,000 mcg Oral Daily   Continuous Infusions: . sodium chloride 125 mL/hr at 12/15/18 0000  . piperacillin-tazobactam (ZOSYN)  IV 3.375 g (12/15/18 0925)  . potassium chloride 10 mEq (12/15/18 1234)     LOS: 1 day   Time spent= 35 mins    Ankit Arsenio Loader, MD [facility]  If 7PM-7AM, please contact night-coverage [email or web address] 12/15/2018, 12:50 PM

## 2018-12-15 NOTE — ED Notes (Signed)
ED TO INPATIENT HANDOFF REPORT  ED Nurse Name and Phone #: Tray Martinique, [phone]  S Name/Age/Gender Justin [name] 85 y.o. male Room/Bed: 018C/018C  Code Status   Code Status: Full Code  Home/SNF/Other Home Patient oriented to: self, place, time and situation Is this baseline? Yes   Triage Complete: Triage complete  Chief Complaint near syncope  Triage Note Pt from home with ems for n/v/d 2 days ago. Worsening today, onset of fever 100.7, took tylenol at home. Denies any CP. EKG shows RBBB. Pt alert upon arrival  Orthostatic BP changes with dizziness.  BP 83/55 P114 CBG 200 20G LAC 4mg  zofran 23ml saline   Allergies Allergies  Allergen Reactions  . Dye Fdc Red [Red Dye] Swelling and Other (See Comments)    CAT scan dye  . Ibuprofen Other (See Comments)    Upset stomach     Level of Care/Admitting Diagnosis ED Disposition    ED Disposition Condition [facility] Area: [facility] [[id number]]  Level of Care: Telemetry Medical [104]  Diagnosis: Sepsis [facility]) [[id number]]  Admitting Physician: Ivor Costa [[id number]]  Attending Physician: Ivor Costa [phone]  Estimated length of stay: past midnight tomorrow  Certification:: I certify this patient will need inpatient services for at least 2 midnights  PT Class (Do Not Modify): Inpatient [101]  PT Acc Code (Do Not Modify): Private [1]       B Medical/Surgery History Past Medical History:  Diagnosis Date  . Anxiety   . Arthritis   . Coronary artery disease   . Diabetes mellitus without complication ([place])   . Diverticulitis   . Dyspnea   . GERD (gastroesophageal reflux disease)   . Hypercholesteremia   . Hypertension   . Pneumonia    as a child  . Prostate cancer ([place])   . UTI (lower urinary tract infection)    Past Surgical History:  Procedure Laterality Date  . ABDOMINAL SURGERY     for diverticulitis; also removed appendix  . CATARACT EXTRACTION    . CATARACT EXTRACTION,  BILATERAL    . CHOLECYSTECTOMY N/A 10/12/2017   Procedure: LAPAROSCOPIC CHOLECYSTECTOMY;  Surgeon: [name], MD;  Location: [facility];  Service: General;  Laterality: N/A;  . CORONARY ARTERY BYPASS GRAFT  2009  . EYE SURGERY     "for bleeding in eye"  . HERNIA REPAIR    . LEFT HEART CATH AND CORONARY ANGIOGRAPHY N/A 11/04/2016   Procedure: Left Heart Cath and Coronary Angiography;  Surgeon: [name], MD;  Location: [facility];  Service: Cardiovascular;  Laterality: N/A;  . LOWER EXTREMITY ANGIOGRAPHY N/A 11/04/2016   Procedure: Lower Extremity Angiography;  Surgeon: [name], MD;  Location: [facility];  Service: Cardiovascular;  Laterality: N/A;  . PROSTATE SURGERY       A IV Location/Drains/Wounds Patient Lines/Drains/Airways Status   Active Line/Drains/Airways    Name:   Placement date:   Placement time:   Site:   Days:   Peripheral IV 12/14/18 Left Antecubital   12/14/18    2051    Antecubital   1   Peripheral IV 12/14/18 Left Forearm   12/14/18    2132    Forearm   1   Incision (Closed) 10/12/17 Abdomen Other (Comment)   10/12/17    0752     429   Incision - 4 Ports Abdomen 1: Umbilicus 2: Mid;Upper 3: Right;Upper 4: Right;Lower   10/12/17    0757  429          Intake/Output Last 24 hours No intake or output data in the 24 hours ending 12/15/18 0020  Labs/Imaging Results for orders placed or performed during the hospital encounter of 12/14/18 (from the past 48 hour(s))  Lactic acid, plasma     Status: Abnormal   Collection Time: 12/14/18  9:14 PM  Result Value Ref Range   Lactic Acid, Venous 2.9 (HH) 0.5 - 1.9 mmol/L    Comment: CRITICAL RESULT CALLED TO, READ BACK BY AND VERIFIED WITH: STRAUGHAN C,RN 12/14/18 2146 WAYK Performed at [facility], [place] [address], [place], [place] [address]   Comprehensive metabolic panel     Status: Abnormal   Collection Time: 12/14/18  9:14 PM  Result Value Ref Range   Sodium 138 135 - 145 mmol/L   Potassium  3.5 3.5 - 5.1 mmol/L   Chloride 107 98 - 111 mmol/L   CO2 20 (L) 22 - 32 mmol/L   Glucose, Bld 182 (H) 70 - 99 mg/dL   BUN 29 (H) 8 - 23 mg/dL   Creatinine, Ser 2.07 (H) 0.61 - 1.24 mg/dL   Calcium 9.4 8.9 - 10.3 mg/dL   Total Protein 6.7 6.5 - 8.1 g/dL   Albumin 2.9 (L) 3.5 - 5.0 g/dL   AST 1,099 (H) 15 - 41 U/L   ALT 823 (H) 0 - 44 U/L   Alkaline Phosphatase 355 (H) 38 - 126 U/L   Total Bilirubin 2.6 (H) 0.3 - 1.2 mg/dL   GFR calc non Af Amer 29 (L) >60 mL/min   GFR calc Af Amer 33 (L) >60 mL/min   Anion gap 11 5 - 15    Comment: Performed at [facility], [place] [address], [place], [place] [address]  CBC WITH DIFFERENTIAL     Status: Abnormal   Collection Time: 12/14/18  9:14 PM  Result Value Ref Range   WBC 17.2 (H) 4.0 - 10.5 K/uL   RBC 4.82 4.22 - 5.81 MIL/uL   Hemoglobin 13.6 13.0 - 17.0 g/dL   HCT 41.0 39.0 - 52.0 %   MCV 85.1 80.0 - 100.0 fL   MCH 28.2 26.0 - 34.0 pg   MCHC 33.2 30.0 - 36.0 g/dL   RDW 14.2 11.5 - 15.5 %   Platelets 239 150 - 400 K/uL   nRBC 0.0 0.0 - 0.2 %   Neutrophils Relative % 90 %   Neutro Abs 15.5 (H) 1.7 - 7.7 K/uL   Lymphocytes Relative 4 %   Lymphs Abs 0.6 (L) 0.7 - 4.0 K/uL   Monocytes Relative 5 %   Monocytes Absolute 0.9 0.1 - 1.0 K/uL   Eosinophils Relative 0 %   Eosinophils Absolute 0.0 0.0 - 0.5 K/uL   Basophils Relative 0 %   Basophils Absolute 0.0 0.0 - 0.1 K/uL   Immature Granulocytes 1 %   Abs Immature Granulocytes 0.14 (H) 0.00 - 0.07 K/uL    Comment: Performed at [facility] [address] [place], [place] [address]  Urinalysis, Routine w reflex microscopic     Status: Abnormal   Collection Time: 12/14/18  9:14 PM  Result Value Ref Range   Color, Urine YELLOW YELLOW   APPearance CLOUDY (A) CLEAR   Specific Gravity, Urine 1.025 1.005 - 1.030   pH 5.5 5.0 - 8.0   Glucose, UA 100 (A) NEGATIVE mg/dL   Hgb urine dipstick SMALL (A) NEGATIVE   Bilirubin Urine MODERATE (A) NEGATIVE  Ketones, ur 15 (A) NEGATIVE  mg/dL   Protein, ur 100 (A) NEGATIVE mg/dL   Nitrite NEGATIVE NEGATIVE   Leukocytes,Ua MODERATE (A) NEGATIVE    Comment: Performed at [facility] [address], [place], [place] [address]  Influenza panel by PCR (type A & B)     Status: None   Collection Time: 12/14/18  9:14 PM  Result Value Ref Range   Influenza A By PCR NEGATIVE NEGATIVE   Influenza B By PCR NEGATIVE NEGATIVE    Comment: (NOTE) The Xpert Xpress Flu assay is intended as an aid in the diagnosis of  influenza and should not be used as a sole basis for treatment.  This  assay is FDA approved for nasopharyngeal swab specimens only. Nasal  washings and aspirates are unacceptable for Xpert Xpress Flu testing. Performed at [facility], [place] [address], [place], [place] [address]   Urinalysis, Microscopic (reflex)     Status: Abnormal   Collection Time: 12/14/18  9:14 PM  Result Value Ref Range   RBC / HPF 0-5 0 - 5 RBC/hpf   WBC, UA >50 0 - 5 WBC/hpf   Bacteria, UA MANY (A) NONE SEEN   Squamous Epithelial / LPF 6-10 0 - 5   Granular Casts, UA PRESENT     Comment: Performed at [facility], [place] [address] [place], [place] [address]  Lipase, blood     Status: None   Collection Time: 12/14/18 10:28 PM  Result Value Ref Range   Lipase 35 11 - 51 U/L    Comment: Performed at [facility] [address], [place], [place] [address]  Protime-INR     Status: Abnormal   Collection Time: 12/14/18 10:28 PM  Result Value Ref Range   Prothrombin Time 15.7 (H) 11.4 - 15.2 seconds   INR 1.3 (H) 0.8 - 1.2    Comment: (NOTE) INR goal varies based on device and disease states. Performed at [facility], [place] [address], [place], [place] [address]   Acetaminophen level     Status: None   Collection Time: 12/14/18 10:51 PM  Result Value Ref Range   Acetaminophen (Tylenol), Serum 15 10 - 30 ug/mL    Comment: (NOTE) Therapeutic concentrations vary significantly. A range of 10-30 ug/mL  may be an  effective concentration for many patients. However, some  are best treated at concentrations outside of this range. Acetaminophen concentrations >150 ug/mL at 4 hours after ingestion  and >50 ug/mL at 12 hours after ingestion are often associated with  toxic reactions. Performed at [facility], [place] [address], [place], [place] [address]   Lactic acid, plasma     Status: Abnormal   Collection Time: 12/14/18 11:09 PM  Result Value Ref Range   Lactic Acid, Venous 3.2 (HH) 0.5 - 1.9 mmol/L    Comment: CRITICAL RESULT CALLED TO, READ BACK BY AND VERIFIED WITH: STRAUGHAN C,RN 12/14/18 2352 WAYK Performed at [facility], [place] [address] [place], [place] [address]    Dg Chest Port 1 View  Result Date: 12/14/2018 CLINICAL DATA:  Nausea and vomiting with recent fever, initial encounter EXAM: PORTABLE CHEST 1 VIEW COMPARISON:  None. FINDINGS: Cardiac shadows within normal limits. Postsurgical changes are seen. The lungs are clear bilaterally. No acute bony abnormality is noted. IMPRESSION: No active disease. Electronically Signed   By: Inez Catalina M.D.   On: 12/14/2018 21:35   US Abdomen Limited Ruq  Result Date: 12/14/2018 CLINICAL DATA:  Pain, vomiting EXAM: ULTRASOUND ABDOMEN LIMITED RIGHT UPPER QUADRANT COMPARISON:  CT and ultrasound 10/12/2018 FINDINGS: Gallbladder: Prior cholecystectomy Common bile duct: Diameter: Normal caliber for post cholecystectomy state, 7 mm. Liver: No focal lesion identified. Within normal limits in parenchymal echogenicity. Portal vein is patent on color Doppler imaging with normal direction of blood flow towards the liver. IMPRESSION: Prior cholecystectomy.  No acute findings. Electronically Signed   By: Rolm Baptise M.D.   On: 12/14/2018 23:27    Pending Labs Unresulted Labs (From admission, onward)    Start     Ordered   12/15/18 0500  Comprehensive metabolic panel  Tomorrow morning,   R     12/14/18 2330   12/15/18 0500  CBC  Tomorrow morning,    R     12/14/18 2330   12/14/18 2335  Lactic acid, plasma  STAT Now then every 3 hours,   STAT     12/14/18 2335   12/14/18 2335  Procalcitonin  ONCE - STAT,   R     12/14/18 2335   12/14/18 2335  APTT  Once,   R     12/14/18 2335   12/14/18 2241  Antinuclear Antibodies, IFA  Once,   R     12/14/18 2241   12/14/18 2241  IgG  Once,   R     12/14/18 2241   12/14/18 2109  Blood Culture (routine x 2)  BLOOD CULTURE X 2,   STAT     12/14/18 2109   12/14/18 2109  Urine culture  ONCE - STAT,   STAT     12/14/18 2109          Vitals/Pain Today's Vitals   12/14/18 2200 12/14/18 2215 12/14/18 2230 12/14/18 2245  BP: (!) 118/48 (!) 108/49 (!) 109/50 (!) 104/50  Pulse: 75 74 68 69  Resp: (!) 29 (!) 28 (!) 28 (!) 24  Temp:      TempSrc:      SpO2: 95% 99% 99% 99%  Weight:      Height:      PainSc:        Isolation Precautions No active isolations  Medications Medications  lactated ringers bolus 1,000 mL (has no administration in time range)  sodium chloride 0.9 % bolus 500 mL (has no administration in time range)  0.9 %  sodium chloride infusion (has no administration in time range)  aspirin EC tablet 81 mg (has no administration in time range)  isosorbide mononitrate (IMDUR) 24 hr tablet 60 mg (has no administration in time range)  nitroGLYCERIN (NITROSTAT) SL tablet 0.4 mg (has no administration in time range)  DULoxetine HCl CPEP 1 capsule (has no administration in time range)  Insulin Glargine (LANTUS) Solostar Pen 10 Units (has no administration in time range)  lubiprostone (AMITIZA) capsule 24 mcg (has no administration in time range)  pantoprazole (PROTONIX) EC tablet 40 mg (has no administration in time range)  cilostazol (PLETAL) tablet 100 mg (has no administration in time range)  vitamin B-12 (CYANOCOBALAMIN) tablet 1,000 mcg (has no administration in time range)  clonazePAM (KLONOPIN) tablet 0.5 mg (has no administration in time range)  pregabalin (LYRICA) capsule  50 mg (has no administration in time range)  cholecalciferol (VITAMIN D3) tablet 1,000 Units (has no administration in time range)  tamsulosin (FLOMAX) capsule 0.4 mg (has no administration in time range)  heparin injection 5,000 Units (has no administration in time range)  hydrALAZINE (APRESOLINE) injection 5  mg (has no administration in time range)  insulin aspart (novoLOG) injection 0-9 Units (has no administration in time range)  piperacillin-tazobactam (ZOSYN) IVPB 3.375 g (has no administration in time range)  piperacillin-tazobactam (ZOSYN) IVPB 3.375 g (has no administration in time range)  acetaminophen (TYLENOL) tablet 650 mg (has no administration in time range)  lactated ringers bolus 1,000 mL (0 mLs Intravenous Stopped 12/14/18 2228)    Mobility walks with device Low fall risk   Focused Assessments Pulmonary Assessment Handoff:  Lung sounds:   O2 Device: Room Air        R Recommendations: See Admitting Provider Note  Report given to:   Additional Notes:

## 2018-12-15 NOTE — Consult Note (Signed)
Reason for Consult: Abnormal liver enzymes, nausea, and vomiting Referring Physician: [facility]  [name] HPI: This is an 85 year old male who is well-known to me readmitted for complaints of upper abdominal pain, nausea, and vomiting.  The patient's symptoms started acutely last evening.  He was in the office to obtain more blood work to work up his abnormal liver enzymes.  In the recent prior hospitalization his liver enzymes were elevated and then it dropped, but the values never normalized.  Clinically he was well and imaging in January was negative for any evidence of biliary obstruction with the abdominal ultrasound and the MRCP.  This morning he is feeling better, but he had a fever at the time of admission to 101.2 F.  The liver enzymes were markedly elevated with his AST at 1099 and ALT 823, but the values are declining.  The WBC was elevated at 17.2 and he was found to have moderate leukocytes in his urine.  Zosyn was started as he also exhibited an elevation in his lactate level.  As an outpatient he continued to have an abnormal elevation in his liver enzymes in the 200 range and further work up was being pursued for an autoimmune hepatitis.  Past Medical History:  Diagnosis Date  . Anxiety   . Arthritis   . Coronary artery disease   . Diabetes mellitus without complication ([place])   . Diverticulitis   . Dyspnea   . GERD (gastroesophageal reflux disease)   . Hypercholesteremia   . Hypertension   . Pneumonia    as a child  . Prostate cancer ([place])   . UTI (lower urinary tract infection)     Past Surgical History:  Procedure Laterality Date  . ABDOMINAL SURGERY     for diverticulitis; also removed appendix  . CATARACT EXTRACTION    . CATARACT EXTRACTION, BILATERAL    . CHOLECYSTECTOMY N/A 10/12/2017   Procedure: LAPAROSCOPIC CHOLECYSTECTOMY;  Surgeon: [name], MD;  Location: [place];  Service: General;  Laterality: N/A;  . CORONARY ARTERY BYPASS GRAFT  2009   . EYE SURGERY     "for bleeding in eye"  . HERNIA REPAIR    . LEFT HEART CATH AND CORONARY ANGIOGRAPHY N/A 11/04/2016   Procedure: Left Heart Cath and Coronary Angiography;  Surgeon: [name], MD;  Location: [facility];  Service: Cardiovascular;  Laterality: N/A;  . LOWER EXTREMITY ANGIOGRAPHY N/A 11/04/2016   Procedure: Lower Extremity Angiography;  Surgeon: [name], MD;  Location: [place] [facility];  Service: Cardiovascular;  Laterality: N/A;  . PROSTATE SURGERY      Family History  Problem Relation Age of Onset  . Diabetes Father   . Diabetes Maternal Aunt   . Diabetes Maternal Uncle   . Diabetes Maternal Grandmother   . Heart disease Sister   . Diabetes Brother   . Heart disease Sister     Social History:  reports that he has quit smoking. He has never used smokeless tobacco. He reports that he does not drink alcohol or use drugs.  Allergies:  Allergies  Allergen Reactions  . Dye Fdc Red [Red Dye] Swelling and Other (See Comments)    CAT scan dye  . Ibuprofen Other (See Comments)    Upset stomach     Medications:  Scheduled: . aspirin EC  81 mg Oral Daily  . cholecalciferol  1,000 Units Oral Daily  . cilostazol  100 mg Oral BID  . DULoxetine  40 mg Oral  BID  . heparin  5,000 Units Subcutaneous Q8H  . insulin aspart  0-9 Units Subcutaneous TID WC  . insulin glargine  10 Units Subcutaneous BID  . isosorbide mononitrate  60 mg Oral Daily  . lubiprostone  24 mcg Oral Q breakfast  . pantoprazole  40 mg Oral Daily  . pregabalin  50 mg Oral TID  . tamsulosin  0.4 mg Oral Daily  . vitamin B-12  1,000 mcg Oral Daily   Continuous: . sodium chloride 125 mL/hr at 12/15/18 0000  . piperacillin-tazobactam (ZOSYN)  IV    . potassium chloride      Results for orders placed or performed during the hospital encounter of 12/14/18 (from the past 24 hour(s))  Lactic acid, plasma     Status: Abnormal   Collection Time: 12/14/18  9:14 PM  Result Value Ref Range    Lactic Acid, Venous 2.9 (HH) 0.5 - 1.9 mmol/L  Comprehensive metabolic panel     Status: Abnormal   Collection Time: 12/14/18  9:14 PM  Result Value Ref Range   Sodium 138 135 - 145 mmol/L   Potassium 3.5 3.5 - 5.1 mmol/L   Chloride 107 98 - 111 mmol/L   CO2 20 (L) 22 - 32 mmol/L   Glucose, Bld 182 (H) 70 - 99 mg/dL   BUN 29 (H) 8 - 23 mg/dL   Creatinine, Ser 2.07 (H) 0.61 - 1.24 mg/dL   Calcium 9.4 8.9 - 10.3 mg/dL   Total Protein 6.7 6.5 - 8.1 g/dL   Albumin 2.9 (L) 3.5 - 5.0 g/dL   AST 1,099 (H) 15 - 41 U/L   ALT 823 (H) 0 - 44 U/L   Alkaline Phosphatase 355 (H) 38 - 126 U/L   Total Bilirubin 2.6 (H) 0.3 - 1.2 mg/dL   GFR calc non Af Amer 29 (L) >60 mL/min   GFR calc Af Amer 33 (L) >60 mL/min   Anion gap 11 5 - 15  CBC WITH DIFFERENTIAL     Status: Abnormal   Collection Time: 12/14/18  9:14 PM  Result Value Ref Range   WBC 17.2 (H) 4.0 - 10.5 K/uL   RBC 4.82 4.22 - 5.81 MIL/uL   Hemoglobin 13.6 13.0 - 17.0 g/dL   HCT 41.0 39.0 - 52.0 %   MCV 85.1 80.0 - 100.0 fL   MCH 28.2 26.0 - 34.0 pg   MCHC 33.2 30.0 - 36.0 g/dL   RDW 14.2 11.5 - 15.5 %   Platelets 239 150 - 400 K/uL   nRBC 0.0 0.0 - 0.2 %   Neutrophils Relative % 90 %   Neutro Abs 15.5 (H) 1.7 - 7.7 K/uL   Lymphocytes Relative 4 %   Lymphs Abs 0.6 (L) 0.7 - 4.0 K/uL   Monocytes Relative 5 %   Monocytes Absolute 0.9 0.1 - 1.0 K/uL   Eosinophils Relative 0 %   Eosinophils Absolute 0.0 0.0 - 0.5 K/uL   Basophils Relative 0 %   Basophils Absolute 0.0 0.0 - 0.1 K/uL   Immature Granulocytes 1 %   Abs Immature Granulocytes 0.14 (H) 0.00 - 0.07 K/uL  Urinalysis, Routine w reflex microscopic     Status: Abnormal   Collection Time: 12/14/18  9:14 PM  Result Value Ref Range   Color, Urine YELLOW YELLOW   APPearance CLOUDY (A) CLEAR   Specific Gravity, Urine 1.025 1.005 - 1.030   pH 5.5 5.0 - 8.0   Glucose, UA 100 (A) NEGATIVE mg/dL   Hgb  urine dipstick SMALL (A) NEGATIVE   Bilirubin Urine MODERATE (A) NEGATIVE    Ketones, ur 15 (A) NEGATIVE mg/dL   Protein, ur 100 (A) NEGATIVE mg/dL   Nitrite NEGATIVE NEGATIVE   Leukocytes,Ua MODERATE (A) NEGATIVE  Influenza panel by PCR (type A & B)     Status: None   Collection Time: 12/14/18  9:14 PM  Result Value Ref Range   Influenza A By PCR NEGATIVE NEGATIVE   Influenza B By PCR NEGATIVE NEGATIVE  Urinalysis, Microscopic (reflex)     Status: Abnormal   Collection Time: 12/14/18  9:14 PM  Result Value Ref Range   RBC / HPF 0-5 0 - 5 RBC/hpf   WBC, UA >50 0 - 5 WBC/hpf   Bacteria, UA MANY (A) NONE SEEN   Squamous Epithelial / LPF 6-10 0 - 5   Granular Casts, UA PRESENT   Lipase, blood     Status: None   Collection Time: 12/14/18 10:28 PM  Result Value Ref Range   Lipase 35 11 - 51 U/L  Protime-INR     Status: Abnormal   Collection Time: 12/14/18 10:28 PM  Result Value Ref Range   Prothrombin Time 15.7 (H) 11.4 - 15.2 seconds   INR 1.3 (H) 0.8 - 1.2  Acetaminophen level     Status: None   Collection Time: 12/14/18 10:51 PM  Result Value Ref Range   Acetaminophen (Tylenol), Serum 15 10 - 30 ug/mL  Lactic acid, plasma     Status: Abnormal   Collection Time: 12/14/18 11:09 PM  Result Value Ref Range   Lactic Acid, Venous 3.2 (HH) 0.5 - 1.9 mmol/L  Comprehensive metabolic panel     Status: Abnormal   Collection Time: 12/15/18  2:08 AM  Result Value Ref Range   Sodium 138 135 - 145 mmol/L   Potassium 3.4 (L) 3.5 - 5.1 mmol/L   Chloride 107 98 - 111 mmol/L   CO2 22 22 - 32 mmol/L   Glucose, Bld 168 (H) 70 - 99 mg/dL   BUN 27 (H) 8 - 23 mg/dL   Creatinine, Ser 1.96 (H) 0.61 - 1.24 mg/dL   Calcium 8.6 (L) 8.9 - 10.3 mg/dL   Total Protein 5.4 (L) 6.5 - 8.1 g/dL   Albumin 2.3 (L) 3.5 - 5.0 g/dL   AST 665 (H) 15 - 41 U/L   ALT 651 (H) 0 - 44 U/L   Alkaline Phosphatase 291 (H) 38 - 126 U/L   Total Bilirubin 2.7 (H) 0.3 - 1.2 mg/dL   GFR calc non Af Amer 31 (L) >60 mL/min   GFR calc Af Amer 36 (L) >60 mL/min   Anion gap 9 5 - 15  CBC     Status:  Abnormal   Collection Time: 12/15/18  2:08 AM  Result Value Ref Range   WBC 16.4 (H) 4.0 - 10.5 K/uL   RBC 4.00 (L) 4.22 - 5.81 MIL/uL   Hemoglobin 10.9 (L) 13.0 - 17.0 g/dL   HCT 33.9 (L) 39.0 - 52.0 %   MCV 84.8 80.0 - 100.0 fL   MCH 27.3 26.0 - 34.0 pg   MCHC 32.2 30.0 - 36.0 g/dL   RDW 14.3 11.5 - 15.5 %   Platelets 193 150 - 400 K/uL   nRBC 0.0 0.0 - 0.2 %  Lactic acid, plasma     Status: None   Collection Time: 12/15/18  2:08 AM  Result Value Ref Range   Lactic Acid, Venous 1.6 0.5 - 1.9  mmol/L  Procalcitonin     Status: None   Collection Time: 12/15/18  2:08 AM  Result Value Ref Range   Procalcitonin 20.13 ng/mL  APTT     Status: None   Collection Time: 12/15/18  2:08 AM  Result Value Ref Range   aPTT 33 24 - 36 seconds  Glucose, capillary     Status: Abnormal   Collection Time: 12/15/18  8:34 AM  Result Value Ref Range   Glucose-Capillary 128 (H) 70 - 99 mg/dL     Dg Chest Port 1 View  Result Date: 12/14/2018 CLINICAL DATA:  Nausea and vomiting with recent fever, initial encounter EXAM: PORTABLE CHEST 1 VIEW COMPARISON:  None. FINDINGS: Cardiac shadows within normal limits. Postsurgical changes are seen. The lungs are clear bilaterally. No acute bony abnormality is noted. IMPRESSION: No active disease. Electronically Signed   By: Inez Catalina M.D.   On: 12/14/2018 21:35   US Abdomen Limited Ruq  Result Date: 12/14/2018 CLINICAL DATA:  Pain, vomiting EXAM: ULTRASOUND ABDOMEN LIMITED RIGHT UPPER QUADRANT COMPARISON:  CT and ultrasound 10/12/2018 FINDINGS: Gallbladder: Prior cholecystectomy Common bile duct: Diameter: Normal caliber for post cholecystectomy state, 7 mm. Liver: No focal lesion identified. Within normal limits in parenchymal echogenicity. Portal vein is patent on color Doppler imaging with normal direction of blood flow towards the liver. IMPRESSION: Prior cholecystectomy.  No acute findings. Electronically Signed   By: Rolm Baptise M.D.   On: 12/14/2018 23:27     ROS:  As stated above in the HPI otherwise negative.  Blood pressure (!) 118/37, pulse 68, temperature 98.2 F (36.8 C), temperature source Oral, resp. rate 18, height 5\' 7"  (1.702 m), weight 80.5 kg, SpO2 99 %.    PE: Gen: NAD, Alert and Oriented HEENT:  [place]/AT, EOMI Neck: Supple, no LAD Lungs: CTA Bilaterally CV: RRR without M/G/R ABM: Soft, minimal upper abdominal pain Ext: No C/C/E  Assessment/Plan: 1) Abnormal liver enzymes. 2) Fever. 3) UTI. 4) Upper abdominal pain.    Repeat imaging of his biliary tract only showed that his CBD was at 7 mm.  Clinically he appears to have a biliary obstruction, but this does not appear to be the case.  AIH can cause these type of abnormalities, however, fever is not a characteristic finding.  The fever can be as a result of his UTI.  An ANA and IgG was requested last evening when the ER contacted me about his admission.  Also, he has blood work pending from the office.  His Tylenol level was normal.  Plan: 1) Continue Zosyn for now. 2) Supportive care. 3) Follow liver panel. Breckin Zafar D 12/15/2018, 9:02 AM

## 2018-12-15 NOTE — Progress Notes (Signed)
PHARMACY - PHYSICIAN COMMUNICATION CRITICAL VALUE ALERT - BLOOD CULTURE IDENTIFICATION (BCID)  Justin [name] is an 85 y.o. male who presented to [facility] on 12/14/2018 with a chief complaint of nausea, vomiting, abdominal pain, and increased urinary frequency.  Assessment:  Patient with suspected UTI versus abdominal infection on day #2 of Zosyn. Blood cultures are positive for GNR and BCID is positive for EColi (no resistance mechanisms detected).   Name of physician (or Provider) Contacted: Dr. Damita Lack  Current antibiotics: Zosyn 3.375g IV every 8 hours - 4 hour infusion.   Changes to prescribed antibiotics recommended:  Patient is on recommended antibiotics - No changes needed  Did discuss possibility of Rocephin + Flagyl with Dr. Reesa Chew.   Results for orders placed or performed during the hospital encounter of 12/14/18  Blood Culture ID Panel (Reflexed) (Collected: 12/14/2018  9:15 PM)  Result Value Ref Range   Enterococcus species NOT DETECTED NOT DETECTED   Listeria monocytogenes NOT DETECTED NOT DETECTED   Staphylococcus species NOT DETECTED NOT DETECTED   Staphylococcus aureus (BCID) NOT DETECTED NOT DETECTED   Streptococcus species NOT DETECTED NOT DETECTED   Streptococcus agalactiae NOT DETECTED NOT DETECTED   Streptococcus pneumoniae NOT DETECTED NOT DETECTED   Streptococcus pyogenes NOT DETECTED NOT DETECTED   Acinetobacter baumannii NOT DETECTED NOT DETECTED   Enterobacteriaceae species DETECTED (A) NOT DETECTED   Enterobacter cloacae complex NOT DETECTED NOT DETECTED   Escherichia coli DETECTED (A) NOT DETECTED   Klebsiella oxytoca NOT DETECTED NOT DETECTED   Klebsiella pneumoniae NOT DETECTED NOT DETECTED   Proteus species NOT DETECTED NOT DETECTED   Serratia marcescens NOT DETECTED NOT DETECTED   Carbapenem resistance NOT DETECTED NOT DETECTED   Haemophilus influenzae NOT DETECTED NOT DETECTED   Neisseria meningitidis NOT DETECTED NOT DETECTED   Pseudomonas aeruginosa NOT DETECTED NOT DETECTED   Candida albicans NOT DETECTED NOT DETECTED   Candida glabrata NOT DETECTED NOT DETECTED   Candida krusei NOT DETECTED NOT DETECTED   Candida parapsilosis NOT DETECTED NOT DETECTED   Candida tropicalis NOT DETECTED NOT DETECTED    Brain Hilts 12/15/2018  3:18 PM

## 2018-12-16 LAB — COMPREHENSIVE METABOLIC PANEL
ALT: 344 U/L — ABNORMAL HIGH (ref 0–44)
AST: 240 U/L — ABNORMAL HIGH (ref 15–41)
Albumin: 2.2 g/dL — ABNORMAL LOW (ref 3.5–5.0)
Alkaline Phosphatase: 215 U/L — ABNORMAL HIGH (ref 38–126)
Anion gap: 6 (ref 5–15)
BUN: 17 mg/dL (ref 8–23)
CO2: 21 mmol/L — ABNORMAL LOW (ref 22–32)
Calcium: 8.4 mg/dL — ABNORMAL LOW (ref 8.9–10.3)
Chloride: 112 mmol/L — ABNORMAL HIGH (ref 98–111)
Creatinine, Ser: 1.62 mg/dL — ABNORMAL HIGH (ref 0.61–1.24)
GFR calc Af Amer: 45 mL/min — ABNORMAL LOW (ref >60)
GFR calc non Af Amer: 39 mL/min — ABNORMAL LOW (ref >60)
Glucose, Bld: 69 mg/dL — ABNORMAL LOW (ref 70–99)
Potassium: 3.2 mmol/L — ABNORMAL LOW (ref 3.5–5.1)
Sodium: 139 mmol/L (ref 135–145)
Total Bilirubin: 1.7 mg/dL — ABNORMAL HIGH (ref 0.3–1.2)
Total Protein: 5.4 g/dL — ABNORMAL LOW (ref 6.5–8.1)

## 2018-12-16 LAB — GLUCOSE, CAPILLARY
Glucose-Capillary: 59 mg/dL — ABNORMAL LOW (ref 70–99)
Glucose-Capillary: 85 mg/dL (ref 70–99)
Glucose-Capillary: 120 mg/dL — ABNORMAL HIGH (ref 70–99)
Glucose-Capillary: 142 mg/dL — ABNORMAL HIGH (ref 70–99)
Glucose-Capillary: 205 mg/dL — ABNORMAL HIGH (ref 70–99)
Glucose-Capillary: 193 mg/dL — ABNORMAL HIGH (ref 70–99)

## 2018-12-16 LAB — CBC
HCT: 30.6 % — ABNORMAL LOW (ref 39.0–52.0)
Hemoglobin: 9.9 g/dL — ABNORMAL LOW (ref 13.0–17.0)
MCH: 27.5 pg (ref 26.0–34.0)
MCHC: 32.4 g/dL (ref 30.0–36.0)
MCV: 85 fL (ref 80.0–100.0)
Platelets: 175 10*3/uL (ref 150–400)
RBC: 3.6 MIL/uL — ABNORMAL LOW (ref 4.22–5.81)
RDW: 14.5 % (ref 11.5–15.5)
WBC: 10.3 10*3/uL (ref 4.0–10.5)
nRBC: 0 % (ref 0.0–0.2)

## 2018-12-16 LAB — IGG: IgG (Immunoglobin G), Serum: 1214 mg/dL (ref 700–1600)

## 2018-12-16 LAB — ANTINUCLEAR ANTIBODIES, IFA: ANA Ab, IFA: NEGATIVE

## 2018-12-16 LAB — MAGNESIUM: Magnesium: 1.7 mg/dL (ref 1.7–2.4)

## 2018-12-16 MED ORDER — MAGNESIUM OXIDE 400 (241.3 MG) MG PO TABS
800.0000 mg | ORAL_TABLET | Freq: Once | ORAL | Status: AC
  Administered 2018-12-16: 800 mg via ORAL
  Filled 2018-12-16: qty 2

## 2018-12-16 MED ORDER — POTASSIUM CHLORIDE CRYS ER 20 MEQ PO TBCR
40.0000 meq | EXTENDED_RELEASE_TABLET | Freq: Once | ORAL | Status: AC
  Administered 2018-12-16: 40 meq via ORAL
  Filled 2018-12-16: qty 2

## 2018-12-16 NOTE — Progress Notes (Signed)
PROGRESS NOTE    Laramie Meissner  IEP:[id number] DOB: November 12, 1932 DOA: 12/14/2018 PCP: [name], MD   Brief Narrative:  85 year old with history of CAD status post CABG, hyperlipidemia, GERD, essential hypertension came to the hospital with complaints of upper abdominal pain, nausea and vomiting.  He was admitted for similar reason about 2 months ago and underwent extensive work-up including MRCP, ultrasound which showed dilated CBD.  Acute hepatitis panel was negative at that time.  Gallbladder stone was removed.  Upon admission he was noted to have significantly elevated LFTs with elevated WBC.  He was started on Zosyn and admitted to the hospital.  IgG levels negative, ANA levels pending.  Blood cultures ended up growing gram-negative rods.   Assessment & Plan:   Principal Problem:   Sepsis ([place]) Active Problems:   Hypertension   GERD (gastroesophageal reflux disease)   S/P CABG (coronary artery bypass graft)   CAD (coronary artery disease)   Acute renal failure superimposed on stage 3 chronic kidney disease (HCC)   UTI (urinary tract infection)   HLD (hyperlipidemia)   Abnormal LFTs  Acute hepatitis with elevated LFTs, unknown etiology Fever, unknown etiology - LFTs have been trending down.  Extensive work-up in the past has been negative -IgG level-negative, ANA level-pending -GI is following.  Procalcitonin levels 20 at the time of admission -Right upper quadrant-negative for any acute abnormality besides history of cholecystectomy.  Gram-negative rod bacteremia, E. coli/Enterobacter -Sensitivities are pending at this time.  Continue IV Zosyn, will tailor antibiotics once we have further data.  Monitor fever curve.  Likely source is urine or biliary tree. -Leukocytosis improving  History of coronary artery disease status post CABG -Currently chest pain-free.  Continue aspirin and Imdur.  Statin on hold due to abnormal LFTs  Essential hypertension -IV hydralazine  PRN.  Antihypertensives on hold due to initial presentation with low blood pressure and concerns for sepsis.  Acute kidney injury on CKD stage III -Admission creatinine 2.0 trended fown to 1.62, baseline 1.5.  With IV fluids it has trended down to 1.96.  Avoid nephrotoxic drugs, gentle hydration.  Diabetes mellitus type 2, insulin-dependent -On Lantus 10 units twice daily, insulin sliding scale and Accu-Chek.  Peripheral neuropathy secondary to diabetes mellitus type 2 -On Lyrica 50 mg 3 times daily  BPH -Continue Flomax  DVT prophylaxis: Full code Code Status: Full code Family Communication: None at bedside Disposition Plan: Maintain hospital stay for antibiotics in the setting of bacteremia.  Also awaiting ANA levels  Consultants:   Gastroenterology  Procedures:   None  Antimicrobials:   Zosyn day 3   Subjective: Remains afebrile, feels a little better this morning does not have any complaints.  Review of Systems Otherwise negative except as per HPI, including: General = no fevers, chills, dizziness, malaise, fatigue HEENT/EYES = negative for pain, redness, loss of vision, double vision, blurred vision, loss of hearing, sore throat, hoarseness, dysphagia Cardiovascular= negative for chest pain, palpitation, murmurs, lower extremity swelling Respiratory/lungs= negative for shortness of breath, cough, hemoptysis, wheezing, mucus production Gastrointestinal= negative for nausea, vomiting,, abdominal pain, melena, hematemesis Genitourinary= negative for Dysuria, Hematuria, Change in Urinary Frequency MSK = Negative for arthralgia, myalgias, Back Pain, Joint swelling  Neurology= Negative for headache, seizures, numbness, tingling  Psychiatry= Negative for anxiety, depression, suicidal and homocidal ideation Allergy/Immunology= Medication/Food allergy as listed  Skin= Negative for Rash, lesions, ulcers, itching  Objective: Vitals:   12/15/18 0525 12/15/18 1319 12/15/18  2156 12/16/18 0519  BP: (!) 118/37 (!) 123/50  127/61 (!) 140/53  Pulse: 68 61 (!) 55 69  Resp: 18 18 18 18   Temp: 98.2 F (36.8 C) 98 F (36.7 C) 98.6 F (37 C) 98.8 F (37.1 C)  TempSrc: Oral Oral Oral Oral  SpO2: 99% 100% 100% 96%  Weight:      Height:        Intake/Output Summary (Last 24 hours) at 12/16/2018 1116 Last data filed at 12/16/2018 0825 Gross per 24 hour  Intake 3530.24 ml  Output 950 ml  Net 2580.24 ml   Filed Weights   12/14/18 2110 12/15/18 0117  Weight: 77.1 kg 80.5 kg    Examination:  Constitutional: NAD, calm, comfortable Eyes: PERRL, lids and conjunctivae normal ENMT: Mucous membranes are moist. Posterior pharynx clear of any exudate or lesions.Normal dentition.  Neck: normal, supple, no masses, no thyromegaly Respiratory: clear to auscultation bilaterally, no wheezing, no crackles. Normal respiratory effort. No accessory muscle use.  Cardiovascular: Regular rate and rhythm, no murmurs / rubs / gallops. No extremity edema. 2+ pedal pulses. No carotid bruits.  Abdomen: no tenderness, no masses palpated. No hepatosplenomegaly. Bowel sounds positive.  Musculoskeletal: no clubbing / cyanosis. No joint deformity upper and lower extremities. Good ROM, no contractures. Normal muscle tone.  Skin: no rashes, lesions, ulcers. No induration Neurologic: CN 2-12 grossly intact. Sensation intact, DTR normal. Strength 4/5 in all 4.  Psychiatric: Normal judgment and insight. Alert and oriented x 3. Normal mood.    Data Reviewed:   CBC: Recent Labs  Lab 12/14/18 2114 12/15/18 0208 12/16/18 0341  WBC 17.2* 16.4* 10.3  NEUTROABS 15.5*  --   --   HGB 13.6 10.9* 9.9*  HCT 41.0 33.9* 30.6*  MCV 85.1 84.8 85.0  PLT 239 193 706   Basic Metabolic Panel: Recent Labs  Lab 12/14/18 2114 12/15/18 0208 12/16/18 0341  NA 138 138 139  K 3.5 3.4* 3.2*  CL 107 107 112*  CO2 20* 22 21*  GLUCOSE 182* 168* 69*  BUN 29* 27* 17  CREATININE 2.07* 1.96* 1.62*  CALCIUM  9.4 8.6* 8.4*  MG  --   --  1.7   GFR: Estimated Creatinine Clearance: 35.1 mL/min (A) (by C-G formula based on SCr of 1.62 mg/dL (H)). Liver Function Tests: Recent Labs  Lab 12/14/18 2114 12/15/18 0208 12/16/18 0341  AST 1,099* 665* 240*  ALT 823* 651* 344*  ALKPHOS 355* 291* 215*  BILITOT 2.6* 2.7* 1.7*  PROT 6.7 5.4* 5.4*  ALBUMIN 2.9* 2.3* 2.2*   Recent Labs  Lab 12/14/18 2228  LIPASE 35   No results for input(s): AMMONIA in the last 168 hours. Coagulation Profile: Recent Labs  Lab 12/14/18 2228  INR 1.3*   Cardiac Enzymes: No results for input(s): CKTOTAL, CKMB, CKMBINDEX, TROPONINI in the last 168 hours. BNP (last 3 results) No results for input(s): PROBNP in the last 8760 hours. HbA1C: No results for input(s): HGBA1C in the last 72 hours. CBG: Recent Labs  Lab 12/15/18 1318 12/15/18 1659 12/16/18 0516 12/16/18 0604 12/16/18 0819  GLUCAP 121* 112* 59* 85 120*   Lipid Profile: No results for input(s): CHOL, HDL, LDLCALC, TRIG, CHOLHDL, LDLDIRECT in the last 72 hours. Thyroid Function Tests: No results for input(s): TSH, T4TOTAL, FREET4, T3FREE, THYROIDAB in the last 72 hours. Anemia Panel: No results for input(s): VITAMINB12, FOLATE, FERRITIN, TIBC, IRON, RETICCTPCT in the last 72 hours. Sepsis Labs: Recent Labs  Lab 12/14/18 2114 12/14/18 2309 12/15/18 0208  PROCALCITON  --   --  20.13  LATICACIDVEN 2.9* 3.2* 1.6    Recent Results (from the past 240 hour(s))  Blood Culture (routine x 2)     Status: Abnormal (Preliminary result)   Collection Time: 12/14/18  9:00 PM  Result Value Ref Range Status   Specimen Description BLOOD RIGHT ARM  Final   Special Requests   Final    BOTTLES DRAWN AEROBIC AND ANAEROBIC Blood Culture results may not be optimal due to an excessive volume of blood received in culture bottles   Culture  Setup Time   Final    GRAM NEGATIVE RODS AEROBIC BOTTLE ONLY CRITICAL VALUE NOTED.  VALUE IS CONSISTENT WITH PREVIOUSLY  REPORTED AND CALLED VALUE.    Culture (A)  Final    ESCHERICHIA COLI SUSCEPTIBILITIES TO FOLLOW Performed at [facility], [place] [address] [place], [place] [address]    Report Status PENDING  Incomplete  Blood Culture (routine x 2)     Status: Abnormal (Preliminary result)   Collection Time: 12/14/18  9:15 PM  Result Value Ref Range Status   Specimen Description BLOOD RIGHT HAND  Final   Special Requests   Final    BOTTLES DRAWN AEROBIC ONLY Blood Culture results may not be optimal due to an excessive volume of blood received in culture bottles   Culture  Setup Time   Final    GRAM NEGATIVE RODS AEROBIC BOTTLE ONLY CRITICAL RESULT CALLED TO, READ BACK BY AND VERIFIED WITH: Karlene Einstein PharmD 15:05 12/15/18 ([email or web address])    Culture (A)  Final    ESCHERICHIA COLI SUSCEPTIBILITIES TO FOLLOW Performed at [facility], [place] [address], [place],  [address]    Report Status PENDING  Incomplete  Blood Culture ID Panel (Reflexed)     Status: Abnormal   Collection Time: 12/14/18  9:15 PM  Result Value Ref Range Status   Enterococcus species NOT DETECTED NOT DETECTED Final   Listeria monocytogenes NOT DETECTED NOT DETECTED Final   Staphylococcus species NOT DETECTED NOT DETECTED Final   Staphylococcus aureus (BCID) NOT DETECTED NOT DETECTED Final   Streptococcus species NOT DETECTED NOT DETECTED Final   Streptococcus agalactiae NOT DETECTED NOT DETECTED Final   Streptococcus pneumoniae NOT DETECTED NOT DETECTED Final   Streptococcus pyogenes NOT DETECTED NOT DETECTED Final   Acinetobacter baumannii NOT DETECTED NOT DETECTED Final   Enterobacteriaceae species DETECTED (A) NOT DETECTED Final    Comment: Enterobacteriaceae represent a large family of gram-negative bacteria, not a single organism. CRITICAL RESULT CALLED TO, READ BACK BY AND VERIFIED WITH: Karlene Einstein PharmD 15:05 12/15/18 ([email or web address])    Enterobacter cloacae complex NOT DETECTED NOT DETECTED Final    Escherichia coli DETECTED (A) NOT DETECTED Final    Comment: CRITICAL RESULT CALLED TO, READ BACK BY AND VERIFIED WITH: Karlene Einstein PharmD 15:05 12/15/18 ([email or web address])    Klebsiella oxytoca NOT DETECTED NOT DETECTED Final   Klebsiella pneumoniae NOT DETECTED NOT DETECTED Final   Proteus species NOT DETECTED NOT DETECTED Final   Serratia marcescens NOT DETECTED NOT DETECTED Final   Carbapenem resistance NOT DETECTED NOT DETECTED Final   Haemophilus influenzae NOT DETECTED NOT DETECTED Final   Neisseria meningitidis NOT DETECTED NOT DETECTED Final   Pseudomonas aeruginosa NOT DETECTED NOT DETECTED Final   Candida albicans NOT DETECTED NOT DETECTED Final   Candida glabrata NOT DETECTED NOT DETECTED Final   Candida krusei NOT DETECTED NOT DETECTED Final   Candida parapsilosis NOT DETECTED NOT DETECTED Final   Candida tropicalis NOT DETECTED NOT DETECTED Final  Comment: Performed at [facility], [place] [address] [place], [place] [address]         Radiology Studies: Dg Chest [place] 1 View  Result Date: 12/14/2018 CLINICAL DATA:  Nausea and vomiting with recent fever, initial encounter EXAM: PORTABLE CHEST 1 VIEW COMPARISON:  None. FINDINGS: Cardiac shadows within normal limits. Postsurgical changes are seen. The lungs are clear bilaterally. No acute bony abnormality is noted. IMPRESSION: No active disease. Electronically Signed   By: Inez Catalina M.D.   On: 12/14/2018 21:35   US Abdomen Limited Ruq  Result Date: 12/14/2018 CLINICAL DATA:  Pain, vomiting EXAM: ULTRASOUND ABDOMEN LIMITED RIGHT UPPER QUADRANT COMPARISON:  CT and ultrasound 10/12/2018 FINDINGS: Gallbladder: Prior cholecystectomy Common bile duct: Diameter: Normal caliber for post cholecystectomy state, 7 mm. Liver: No focal lesion identified. Within normal limits in parenchymal echogenicity. Portal vein is patent on color Doppler imaging with normal direction of blood flow towards the liver. IMPRESSION: Prior cholecystectomy.   No acute findings. Electronically Signed   By: Rolm Baptise M.D.   On: 12/14/2018 23:27        Scheduled Meds: . aspirin EC  81 mg Oral Daily  . cholecalciferol  1,000 Units Oral Daily  . cilostazol  100 mg Oral BID  . DULoxetine  40 mg Oral BID  . heparin  5,000 Units Subcutaneous Q8H  . insulin aspart  0-9 Units Subcutaneous TID WC  . insulin glargine  10 Units Subcutaneous BID  . isosorbide mononitrate  60 mg Oral Daily  . lubiprostone  24 mcg Oral Q breakfast  . pantoprazole  40 mg Oral Daily  . pregabalin  50 mg Oral TID  . tamsulosin  0.4 mg Oral Daily  . vitamin B-12  1,000 mcg Oral Daily   Continuous Infusions: . sodium chloride 125 mL/hr at 12/16/18 0817  . piperacillin-tazobactam (ZOSYN)  IV 3.375 g (12/16/18 0813)     LOS: 2 days   Time spent= 35 mins    Sundus Pete Arsenio Loader, MD [facility]  If 7PM-7AM, please contact night-coverage [email or web address] 12/16/2018, 11:16 AM

## 2018-12-16 NOTE — Progress Notes (Signed)
Inpatient Diabetes Program Recommendations  AACE/ADA: New Consensus Statement on Inpatient Glycemic Control (2015)  Target Ranges:  Prepandial:   less than 140 mg/dL      Peak postprandial:   less than 180 mg/dL (1-2 hours)      Critically ill patients:  140 - 180 mg/dL   Results for RIPLEY, BOGOSIAN (MRN [id number]) as of 12/16/2018 09:27  Ref. Range 12/15/2018 13:18 12/15/2018 16:59 12/16/2018 05:16 12/16/2018 06:04 12/16/2018 08:19  Glucose-Capillary Latest Ref Range: 70 - 99 mg/dL 121 (H) 112 (H) 59 (L) 85 120 (H)   Review of Glycemic Control  Diabetes history: DM 2 Outpatient Diabetes medications: Januvia 100 mg Daily, Lantus 20 units BID Current orders for Inpatient glycemic control: Lantus 10 units BID, Novolog 0-9 units tid  Inpatient Diabetes Program Recommendations:    Hypoglycemia this am in the 50's. Please consider decreasing Lantus to 8 units BID.  Thanks,  Tama Headings RN, MSN, BC-ADM Inpatient Diabetes Coordinator Team Pager [phone] (8a-5p)

## 2018-12-16 NOTE — H&P (View-Only) (Signed)
Subjective: Feeling better, but he had diarrhea last evening.  Objective: Vital signs in last 24 hours: Temp:  [98 F (36.7 C)-98.8 F (37.1 C)] 98.8 F (37.1 C) (03/19 0519) Pulse Rate:  [55-69] 69 (03/19 0519) Resp:  [18] 18 (03/19 0519) BP: (123-140)/(50-61) 140/53 (03/19 0519) SpO2:  [96 %-100 %] 96 % (03/19 0519) Last BM Date: 12/15/18  Intake/Output from previous day: 03/18 0701 - 03/19 0700 In: 3530.2 [P.O.:300; I.V.:2700; IV Piggyback:530.2] Out: 950 [Urine:950] Intake/Output this shift: No intake/output data recorded.  General appearance: alert and no distress GI: soft, non-tender; bowel sounds normal; no masses,  no organomegaly  Lab Results: Recent Labs    12/14/18 2114 12/15/18 0208 12/16/18 0341  WBC 17.2* 16.4* 10.3  HGB 13.6 10.9* 9.9*  HCT 41.0 33.9* 30.6*  PLT 239 193 175   BMET Recent Labs    12/14/18 2114 12/15/18 0208 12/16/18 0341  NA 138 138 139  K 3.5 3.4* 3.2*  CL 107 107 112*  CO2 20* 22 21*  GLUCOSE 182* 168* 69*  BUN 29* 27* 17  CREATININE 2.07* 1.96* 1.62*  CALCIUM 9.4 8.6* 8.4*   LFT Recent Labs    12/16/18 0341  PROT 5.4*  ALBUMIN 2.2*  AST 240*  ALT 344*  ALKPHOS 215*  BILITOT 1.7*   PT/INR Recent Labs    12/14/18 2228  LABPROT 15.7*  INR 1.3*   Hepatitis Panel No results for input(s): HEPBSAG, HCVAB, HEPAIGM, HEPBIGM in the last 72 hours. C-Diff No results for input(s): CDIFFTOX in the last 72 hours. Fecal Lactopherrin No results for input(s): FECLLACTOFRN in the last 72 hours.  Studies/Results: Dg Chest Port 1 View  Result Date: 12/14/2018 CLINICAL DATA:  Nausea and vomiting with recent fever, initial encounter EXAM: PORTABLE CHEST 1 VIEW COMPARISON:  None. FINDINGS: Cardiac shadows within normal limits. Postsurgical changes are seen. The lungs are clear bilaterally. No acute bony abnormality is noted. IMPRESSION: No active disease. Electronically Signed   By: Inez Catalina M.D.   On: 12/14/2018 21:35    US Abdomen Limited Ruq  Result Date: 12/14/2018 CLINICAL DATA:  Pain, vomiting EXAM: ULTRASOUND ABDOMEN LIMITED RIGHT UPPER QUADRANT COMPARISON:  CT and ultrasound 10/12/2018 FINDINGS: Gallbladder: Prior cholecystectomy Common bile duct: Diameter: Normal caliber for post cholecystectomy state, 7 mm. Liver: No focal lesion identified. Within normal limits in parenchymal echogenicity. Portal vein is patent on color Doppler imaging with normal direction of blood flow towards the liver. IMPRESSION: Prior cholecystectomy.  No acute findings. Electronically Signed   By: Rolm Baptise M.D.   On: 12/14/2018 23:27    Medications:  Scheduled: . aspirin EC  81 mg Oral Daily  . cholecalciferol  1,000 Units Oral Daily  . cilostazol  100 mg Oral BID  . DULoxetine  40 mg Oral BID  . heparin  5,000 Units Subcutaneous Q8H  . insulin aspart  0-9 Units Subcutaneous TID WC  . insulin glargine  10 Units Subcutaneous BID  . isosorbide mononitrate  60 mg Oral Daily  . lubiprostone  24 mcg Oral Q breakfast  . pantoprazole  40 mg Oral Daily  . pregabalin  50 mg Oral TID  . tamsulosin  0.4 mg Oral Daily  . vitamin B-12  1,000 mcg Oral Daily   Continuous: . sodium chloride 125 mL/hr at 12/16/18 0005  . piperacillin-tazobactam (ZOSYN)  IV 3.375 g (12/15/18 2341)    Assessment/Plan: 1) Abnormal liver enzymes. 2) Diarrhea. 3) UTI.   The patient continues to improve clinically and  his liver enzymes are trending downwards.  The ANA is still pending.  The IgG is normal.  The UTI appears to be controlled and he is afebrile with a normal WBC.  The diarrhea may be as a result of Zosyn.  Plan: 1) Continue supportive care. 2) Await ANA. 3) Continue with antibiotic treatment.  LOS: 2 days   [place]aco Foerster D 12/16/2018, 7:59 AM

## 2018-12-16 NOTE — Progress Notes (Signed)
Subjective: Feeling better, but he had diarrhea last evening.  Objective: Vital signs in last 24 hours: Temp:  [98 F (36.7 C)-98.8 F (37.1 C)] 98.8 F (37.1 C) (03/19 0519) Pulse Rate:  [55-69] 69 (03/19 0519) Resp:  [18] 18 (03/19 0519) BP: (123-140)/(50-61) 140/53 (03/19 0519) SpO2:  [96 %-100 %] 96 % (03/19 0519) Last BM Date: 12/15/18  Intake/Output from previous day: 03/18 0701 - 03/19 0700 In: 3530.2 [P.O.:300; I.V.:2700; IV Piggyback:530.2] Out: 950 [Urine:950] Intake/Output this shift: No intake/output data recorded.  General appearance: alert and no distress GI: soft, non-tender; bowel sounds normal; no masses,  no organomegaly  Lab Results: Recent Labs    12/14/18 2114 12/15/18 0208 12/16/18 0341  WBC 17.2* 16.4* 10.3  HGB 13.6 10.9* 9.9*  HCT 41.0 33.9* 30.6*  PLT 239 193 175   BMET Recent Labs    12/14/18 2114 12/15/18 0208 12/16/18 0341  NA 138 138 139  K 3.5 3.4* 3.2*  CL 107 107 112*  CO2 20* 22 21*  GLUCOSE 182* 168* 69*  BUN 29* 27* 17  CREATININE 2.07* 1.96* 1.62*  CALCIUM 9.4 8.6* 8.4*   LFT Recent Labs    12/16/18 0341  PROT 5.4*  ALBUMIN 2.2*  AST 240*  ALT 344*  ALKPHOS 215*  BILITOT 1.7*   PT/INR Recent Labs    12/14/18 2228  LABPROT 15.7*  INR 1.3*   Hepatitis Panel No results for input(s): HEPBSAG, HCVAB, HEPAIGM, HEPBIGM in the last 72 hours. C-Diff No results for input(s): CDIFFTOX in the last 72 hours. Fecal Lactopherrin No results for input(s): FECLLACTOFRN in the last 72 hours.  Studies/Results: Dg Chest Port 1 View  Result Date: 12/14/2018 CLINICAL DATA:  Nausea and vomiting with recent fever, initial encounter EXAM: PORTABLE CHEST 1 VIEW COMPARISON:  None. FINDINGS: Cardiac shadows within normal limits. Postsurgical changes are seen. The lungs are clear bilaterally. No acute bony abnormality is noted. IMPRESSION: No active disease. Electronically Signed   By: Inez Catalina M.D.   On: 12/14/2018 21:35    US Abdomen Limited Ruq  Result Date: 12/14/2018 CLINICAL DATA:  Pain, vomiting EXAM: ULTRASOUND ABDOMEN LIMITED RIGHT UPPER QUADRANT COMPARISON:  CT and ultrasound 10/12/2018 FINDINGS: Gallbladder: Prior cholecystectomy Common bile duct: Diameter: Normal caliber for post cholecystectomy state, 7 mm. Liver: No focal lesion identified. Within normal limits in parenchymal echogenicity. Portal vein is patent on color Doppler imaging with normal direction of blood flow towards the liver. IMPRESSION: Prior cholecystectomy.  No acute findings. Electronically Signed   By: Rolm Baptise M.D.   On: 12/14/2018 23:27    Medications:  Scheduled: . aspirin EC  81 mg Oral Daily  . cholecalciferol  1,000 Units Oral Daily  . cilostazol  100 mg Oral BID  . DULoxetine  40 mg Oral BID  . heparin  5,000 Units Subcutaneous Q8H  . insulin aspart  0-9 Units Subcutaneous TID WC  . insulin glargine  10 Units Subcutaneous BID  . isosorbide mononitrate  60 mg Oral Daily  . lubiprostone  24 mcg Oral Q breakfast  . pantoprazole  40 mg Oral Daily  . pregabalin  50 mg Oral TID  . tamsulosin  0.4 mg Oral Daily  . vitamin B-12  1,000 mcg Oral Daily   Continuous: . sodium chloride 125 mL/hr at 12/16/18 0005  . piperacillin-tazobactam (ZOSYN)  IV 3.375 g (12/15/18 2341)    Assessment/Plan: 1) Abnormal liver enzymes. 2) Diarrhea. 3) UTI.   The patient continues to improve clinically and  his liver enzymes are trending downwards.  The ANA is still pending.  The IgG is normal.  The UTI appears to be controlled and he is afebrile with a normal WBC.  The diarrhea may be as a result of Zosyn.  Plan: 1) Continue supportive care. 2) Await ANA. 3) Continue with antibiotic treatment.  LOS: 2 days   Nikeria Kalman D 12/16/2018, 7:59 AM

## 2018-12-17 ENCOUNTER — Encounter (HOSPITAL_COMMUNITY): Payer: Self-pay

## 2018-12-17 ENCOUNTER — Inpatient Hospital Stay (HOSPITAL_COMMUNITY): Payer: Medicare PPO | Admitting: Anesthesiology

## 2018-12-17 ENCOUNTER — Encounter (HOSPITAL_COMMUNITY)
Admission: EM | Disposition: A | Discharge status: Home / Self Care | Payer: Self-pay | Source: Home / Self Care | Attending: Internal Medicine

## 2018-12-17 ENCOUNTER — Inpatient Hospital Stay (HOSPITAL_COMMUNITY): Payer: Medicare PPO

## 2018-12-17 HISTORY — PX: EUS: SHX5427

## 2018-12-17 HISTORY — PX: ESOPHAGOGASTRODUODENOSCOPY (EGD) WITH PROPOFOL: SHX5813

## 2018-12-17 LAB — CBC
HCT: 30.8 % — ABNORMAL LOW (ref 39.0–52.0)
Hemoglobin: 10.2 g/dL — ABNORMAL LOW (ref 13.0–17.0)
MCH: 28.3 pg (ref 26.0–34.0)
MCHC: 33.1 g/dL (ref 30.0–36.0)
MCV: 85.3 fL (ref 80.0–100.0)
Platelets: 182 10*3/uL (ref 150–400)
RBC: 3.61 MIL/uL — ABNORMAL LOW (ref 4.22–5.81)
RDW: 14.5 % (ref 11.5–15.5)
WBC: 8.3 10*3/uL (ref 4.0–10.5)
nRBC: 0 % (ref 0.0–0.2)

## 2018-12-17 LAB — GLUCOSE, CAPILLARY
Glucose-Capillary: 98 mg/dL (ref 70–99)
Glucose-Capillary: 118 mg/dL — ABNORMAL HIGH (ref 70–99)
Glucose-Capillary: 149 mg/dL — ABNORMAL HIGH (ref 70–99)
Glucose-Capillary: 156 mg/dL — ABNORMAL HIGH (ref 70–99)

## 2018-12-17 LAB — MAGNESIUM: Magnesium: 1.7 mg/dL (ref 1.7–2.4)

## 2018-12-17 LAB — CULTURE, BLOOD (ROUTINE X 2)

## 2018-12-17 LAB — COMPREHENSIVE METABOLIC PANEL
ALT: 241 U/L — ABNORMAL HIGH (ref 0–44)
AST: 102 U/L — ABNORMAL HIGH (ref 15–41)
Albumin: 2.3 g/dL — ABNORMAL LOW (ref 3.5–5.0)
Alkaline Phosphatase: 199 U/L — ABNORMAL HIGH (ref 38–126)
Anion gap: 8 (ref 5–15)
BUN: 9 mg/dL (ref 8–23)
CO2: 21 mmol/L — ABNORMAL LOW (ref 22–32)
Calcium: 8.8 mg/dL — ABNORMAL LOW (ref 8.9–10.3)
Chloride: 111 mmol/L (ref 98–111)
Creatinine, Ser: 1.32 mg/dL — ABNORMAL HIGH (ref 0.61–1.24)
GFR calc Af Amer: 57 mL/min — ABNORMAL LOW (ref >60)
GFR calc non Af Amer: 50 mL/min — ABNORMAL LOW (ref >60)
Glucose, Bld: 129 mg/dL — ABNORMAL HIGH (ref 70–99)
Potassium: 3.6 mmol/L (ref 3.5–5.1)
Sodium: 140 mmol/L (ref 135–145)
Total Bilirubin: 0.8 mg/dL (ref 0.3–1.2)
Total Protein: 5.8 g/dL — ABNORMAL LOW (ref 6.5–8.1)

## 2018-12-17 LAB — TROPONIN I: Troponin I: <0.03 ng/mL (ref <0.03)

## 2018-12-17 LAB — PROCALCITONIN: Procalcitonin: 5.68 ng/mL

## 2018-12-17 SURGERY — UPPER ENDOSCOPIC ULTRASOUND (EUS) LINEAR
Anesthesia: Monitor Anesthesia Care

## 2018-12-17 MED ORDER — OXYCODONE HCL 5 MG PO TABS
5.0000 mg | ORAL_TABLET | Freq: Once | ORAL | Status: AC
  Administered 2018-12-18: 5 mg via ORAL
  Filled 2018-12-17: qty 1

## 2018-12-17 MED ORDER — LIDOCAINE 2% (20 MG/ML) 5 ML SYRINGE
INTRAMUSCULAR | Status: DC | PRN
  Administered 2018-12-17: 40 mg via INTRAVENOUS
  Administered 2018-12-17: 20 mg via INTRAVENOUS

## 2018-12-17 MED ORDER — SODIUM CHLORIDE 0.9 % IV SOLN
INTRAVENOUS | Status: DC
  Administered 2018-12-17: 14:00:00 via INTRAVENOUS

## 2018-12-17 MED ORDER — PROMETHAZINE HCL 25 MG/ML IJ SOLN
6.2500 mg | Freq: Three times a day (TID) | INTRAMUSCULAR | Status: DC | PRN
  Administered 2018-12-17: 6.25 mg via INTRAVENOUS
  Filled 2018-12-17: qty 1

## 2018-12-17 MED ORDER — CEFAZOLIN SODIUM-DEXTROSE 2-4 GM/100ML-% IV SOLN
2.0000 g | Freq: Three times a day (TID) | INTRAVENOUS | Status: DC
  Administered 2018-12-17 – 2018-12-19 (×6): 2 g via INTRAVENOUS
  Filled 2018-12-17 (×7): qty 100

## 2018-12-17 MED ORDER — PANTOPRAZOLE SODIUM 40 MG PO TBEC
40.0000 mg | DELAYED_RELEASE_TABLET | Freq: Once | ORAL | Status: AC
  Administered 2018-12-17: 40 mg via ORAL
  Filled 2018-12-17: qty 1

## 2018-12-17 MED ORDER — PROPOFOL 10 MG/ML IV BOLUS
INTRAVENOUS | Status: DC | PRN
  Administered 2018-12-17: 30 mg via INTRAVENOUS
  Administered 2018-12-17: 20 mg via INTRAVENOUS

## 2018-12-17 MED ORDER — PROCHLORPERAZINE EDISYLATE 10 MG/2ML IJ SOLN: 5.0000 mg | Freq: Two times a day (BID) | INTRAMUSCULAR | Status: DC | PRN

## 2018-12-17 MED ORDER — PROPOFOL 500 MG/50ML IV EMUL
INTRAVENOUS | Status: DC | PRN
  Administered 2018-12-17: 100 ug/kg/min via INTRAVENOUS

## 2018-12-17 MED ORDER — PROCHLORPERAZINE EDISYLATE 10 MG/2ML IJ SOLN: 10.0000 mg | Freq: Two times a day (BID) | INTRAMUSCULAR | Status: DC | PRN

## 2018-12-17 NOTE — Progress Notes (Signed)
PROGRESS NOTE    Justin [name]  TOI:[id number] DOB: October 15, 1932 DOA: 12/14/2018 PCP: [name], MD   Brief Narrative:  85 year old with history of CAD status post CABG, hyperlipidemia, GERD, essential hypertension came to the hospital with complaints of upper abdominal pain, nausea and vomiting.  He was admitted for similar reason about 2 months ago and underwent extensive work-up including MRCP, ultrasound which showed dilated CBD.  Acute hepatitis panel was negative at that time.  Gallbladder stone was removed.  Upon admission he was noted to have significantly elevated LFTs with elevated WBC.  He was started on Zosyn and admitted to the hospital.  IgG levels negative, ANA levels pending.  Blood cultures ended up growing gram-negative rods, ended up being E. coli.  It is pansensitive therefore will change IV Zosyn to cefazolin.   Assessment & Plan:   Principal Problem:   Sepsis ([place]) Active Problems:   Hypertension   GERD (gastroesophageal reflux disease)   S/P CABG (coronary artery bypass graft)   CAD (coronary artery disease)   Acute renal failure superimposed on stage 3 chronic kidney disease (HCC)   UTI (urinary tract infection)   HLD (hyperlipidemia)   Abnormal LFTs  Acute hepatitis with elevated LFTs, unknown etiology Fever, unknown etiology - LFTs have trended down.  Plans for endoscopy per GI today. -IgG level-negative, was negative -GI is following.  Procalcitonin level trended down from 20 to 5. -Right upper quadrant-negative for any acute abnormality besides history of cholecystectomy.  Gram-negative rod bacteremia, E. coli/Enterobacter -Pansensitive on the data.  Will change Zosyn to IV Ancef. -Leukocytosis improving  History of coronary artery disease status post CABG -Currently chest pain-free.  Continue aspirin and Imdur.  Statin on hold due to abnormal LFTs  Essential hypertension -IV hydralazine PRN.  Antihypertensives on hold due to initial  presentation with low blood pressure and concerns for sepsis.  Acute kidney injury on CKD stage III -Admission creatinine 2.0 trended fown to 1.32, baseline 1.5.  With IV fluids it has trended down to 1.96.  Avoid nephrotoxic drugs, gentle hydration.  Diabetes mellitus type 2, insulin-dependent -On Lantus 10 units twice daily, insulin sliding scale and Accu-Chek.  Peripheral neuropathy secondary to diabetes mellitus type 2 -On Lyrica 50 mg 3 times daily  BPH -Continue Flomax  DVT prophylaxis: Subcutaneous heparin Code Status: Full code Family Communication: None at bedside Disposition Plan: To be determined per endoscopic findings.  Consultants:   Gastroenterology  Procedures:   None  Antimicrobials:   Zosyn day 3 stopped 3/20  Ancef day 1   Subjective: Remains afebrile.  No complaints.  Review of Systems Otherwise negative except as per HPI, including: General = no fevers, chills, dizziness, malaise, fatigue HEENT/EYES = negative for pain, redness, loss of vision, double vision, blurred vision, loss of hearing, sore throat, hoarseness, dysphagia Cardiovascular= negative for chest pain, palpitation, murmurs, lower extremity swelling Respiratory/lungs= negative for shortness of breath, cough, hemoptysis, wheezing, mucus production Gastrointestinal= negative for nausea, vomiting,, abdominal pain, melena, hematemesis Genitourinary= negative for Dysuria, Hematuria, Change in Urinary Frequency MSK = Negative for arthralgia, myalgias, Back Pain, Joint swelling  Neurology= Negative for headache, seizures, numbness, tingling  Psychiatry= Negative for anxiety, depression, suicidal and homocidal ideation Allergy/Immunology= Medication/Food allergy as listed  Skin= Negative for Rash, lesions, ulcers, itching   Objective: Vitals:   12/16/18 2238 12/16/18 2240 12/17/18 0520 12/17/18 0522  BP: (!) 143/60  128/79   Pulse: 70  66   Resp: 20  18   Temp:  97.7  F (36.5 C)   97.9 F (36.6 C)  TempSrc:      SpO2: 98%  98%   Weight:      Height:        Intake/Output Summary (Last 24 hours) at 12/17/2018 1207 Last data filed at 12/17/2018 8850 Gross per 24 hour  Intake 1134.28 ml  Output 1050 ml  Net 84.28 ml   Filed Weights   12/14/18 2110 12/15/18 0117  Weight: 77.1 kg 80.5 kg    Examination: Constitutional: NAD, calm, comfortable Eyes: PERRL, lids and conjunctivae normal ENMT: Mucous membranes are moist. Posterior pharynx clear of any exudate or lesions.Normal dentition.  Neck: normal, supple, no masses, no thyromegaly Respiratory: clear to auscultation bilaterally, no wheezing, no crackles. Normal respiratory effort. No accessory muscle use.  Cardiovascular: Regular rate and rhythm, no murmurs / rubs / gallops. No extremity edema. 2+ pedal pulses. No carotid bruits.  Abdomen: no tenderness, no masses palpated. No hepatosplenomegaly. Bowel sounds positive.  Musculoskeletal: no clubbing / cyanosis. No joint deformity upper and lower extremities. Good ROM, no contractures. Normal muscle tone.  Skin: no rashes, lesions, ulcers. No induration Neurologic: CN 2-12 grossly intact. Sensation intact, DTR normal. Strength 4+/5 in all 4.  Psychiatric: Normal judgment and insight. Alert and oriented x 3. Normal mood.   Data Reviewed:   CBC: Recent Labs  Lab 12/14/18 2114 12/15/18 0208 12/16/18 0341 12/17/18 0353  WBC 17.2* 16.4* 10.3 8.3  NEUTROABS 15.5*  --   --   --   HGB 13.6 10.9* 9.9* 10.2*  HCT 41.0 33.9* 30.6* 30.8*  MCV 85.1 84.8 85.0 85.3  PLT 239 193 175 277   Basic Metabolic Panel: Recent Labs  Lab 12/14/18 2114 12/15/18 0208 12/16/18 0341 12/17/18 0353  NA 138 138 139 140  K 3.5 3.4* 3.2* 3.6  CL 107 107 112* 111  CO2 20* 22 21* 21*  GLUCOSE 182* 168* 69* 129*  BUN 29* 27* 17 9  CREATININE 2.07* 1.96* 1.62* 1.32*  CALCIUM 9.4 8.6* 8.4* 8.8*  MG  --   --  1.7 1.7   GFR: Estimated Creatinine Clearance: 43.1 mL/min (A) (by  C-G formula based on SCr of 1.32 mg/dL (H)). Liver Function Tests: Recent Labs  Lab 12/14/18 2114 12/15/18 0208 12/16/18 0341 12/17/18 0353  AST 1,099* 665* 240* 102*  ALT 823* 651* 344* 241*  ALKPHOS 355* 291* 215* 199*  BILITOT 2.6* 2.7* 1.7* 0.8  PROT 6.7 5.4* 5.4* 5.8*  ALBUMIN 2.9* 2.3* 2.2* 2.3*   Recent Labs  Lab 12/14/18 2228  LIPASE 35   No results for input(s): AMMONIA in the last 168 hours. Coagulation Profile: Recent Labs  Lab 12/14/18 2228  INR 1.3*   Cardiac Enzymes: No results for input(s): CKTOTAL, CKMB, CKMBINDEX, TROPONINI in the last 168 hours. BNP (last 3 results) No results for input(s): PROBNP in the last 8760 hours. HbA1C: No results for input(s): HGBA1C in the last 72 hours. CBG: Recent Labs  Lab 12/16/18 1218 12/16/18 1705 12/16/18 2227 12/17/18 0754 12/17/18 1159  GLUCAP 142* 205* 193* 98 118*   Lipid Profile: No results for input(s): CHOL, HDL, LDLCALC, TRIG, CHOLHDL, LDLDIRECT in the last 72 hours. Thyroid Function Tests: No results for input(s): TSH, T4TOTAL, FREET4, T3FREE, THYROIDAB in the last 72 hours. Anemia Panel: No results for input(s): VITAMINB12, FOLATE, FERRITIN, TIBC, IRON, RETICCTPCT in the last 72 hours. Sepsis Labs: Recent Labs  Lab 12/14/18 2114 12/14/18 2309 12/15/18 0208 12/17/18 0353  PROCALCITON  --   --  20.13 5.68  LATICACIDVEN 2.9* 3.2* 1.6  --     Recent Results (from the past 240 hour(s))  Blood Culture (routine x 2)     Status: Abnormal   Collection Time: 12/14/18  9:00 PM  Result Value Ref Range Status   Specimen Description BLOOD RIGHT ARM  Final   Special Requests   Final    BOTTLES DRAWN AEROBIC AND ANAEROBIC Blood Culture results may not be optimal due to an excessive volume of blood received in culture bottles   Culture  Setup Time   Final    GRAM NEGATIVE RODS AEROBIC BOTTLE ONLY CRITICAL VALUE NOTED.  VALUE IS CONSISTENT WITH PREVIOUSLY REPORTED AND CALLED VALUE.    Culture (A)   Final    ESCHERICHIA COLI SUSCEPTIBILITIES PERFORMED ON PREVIOUS CULTURE WITHIN THE LAST 5 DAYS. Performed at [facility], [place] [address] [place], [place] [address]    Report Status 12/17/2018 FINAL  Final  Blood Culture (routine x 2)     Status: Abnormal   Collection Time: 12/14/18  9:15 PM  Result Value Ref Range Status   Specimen Description BLOOD RIGHT HAND  Final   Special Requests   Final    BOTTLES DRAWN AEROBIC ONLY Blood Culture results may not be optimal due to an excessive volume of blood received in culture bottles   Culture  Setup Time   Final    GRAM NEGATIVE RODS AEROBIC BOTTLE ONLY CRITICAL RESULT CALLED TO, READ BACK BY AND VERIFIED WITH: Karlene Einstein PharmD 15:05 12/15/18 ([email or web address]) Performed at [facility], [place] [address], [place], [place] [address]    Culture ESCHERICHIA COLI (A)  Final   Report Status 12/17/2018 FINAL  Final   Organism ID, Bacteria ESCHERICHIA COLI  Final      Susceptibility   Escherichia coli - MIC*    AMPICILLIN 8 SENSITIVE Sensitive     CEFAZOLIN <=4 SENSITIVE Sensitive     CEFEPIME <=1 SENSITIVE Sensitive     CEFTAZIDIME <=1 SENSITIVE Sensitive     CEFTRIAXONE <=1 SENSITIVE Sensitive     CIPROFLOXACIN <=0.25 SENSITIVE Sensitive     GENTAMICIN <=1 SENSITIVE Sensitive     IMIPENEM <=0.25 SENSITIVE Sensitive     TRIMETH/SULFA <=20 SENSITIVE Sensitive     AMPICILLIN/SULBACTAM <=2 SENSITIVE Sensitive     PIP/TAZO <=4 SENSITIVE Sensitive     Extended ESBL NEGATIVE Sensitive     * ESCHERICHIA COLI  Blood Culture ID Panel (Reflexed)     Status: Abnormal   Collection Time: 12/14/18  9:15 PM  Result Value Ref Range Status   Enterococcus species NOT DETECTED NOT DETECTED Final   Listeria monocytogenes NOT DETECTED NOT DETECTED Final   Staphylococcus species NOT DETECTED NOT DETECTED Final   Staphylococcus aureus (BCID) NOT DETECTED NOT DETECTED Final   Streptococcus species NOT DETECTED NOT DETECTED Final   Streptococcus  agalactiae NOT DETECTED NOT DETECTED Final   Streptococcus pneumoniae NOT DETECTED NOT DETECTED Final   Streptococcus pyogenes NOT DETECTED NOT DETECTED Final   Acinetobacter baumannii NOT DETECTED NOT DETECTED Final   Enterobacteriaceae species DETECTED (A) NOT DETECTED Final    Comment: Enterobacteriaceae represent a large family of gram-negative bacteria, not a single organism. CRITICAL RESULT CALLED TO, READ BACK BY AND VERIFIED WITH: Karlene Einstein PharmD 15:05 12/15/18 ([email or web address])    Enterobacter cloacae complex NOT DETECTED NOT DETECTED Final   Escherichia coli DETECTED (A) NOT DETECTED Final    Comment: CRITICAL RESULT CALLED TO, READ BACK BY AND  VERIFIED WITH: Karlene Einstein PharmD 15:05 12/15/18 ([email or web address])    Klebsiella oxytoca NOT DETECTED NOT DETECTED Final   Klebsiella pneumoniae NOT DETECTED NOT DETECTED Final   Proteus species NOT DETECTED NOT DETECTED Final   Serratia marcescens NOT DETECTED NOT DETECTED Final   Carbapenem resistance NOT DETECTED NOT DETECTED Final   Haemophilus influenzae NOT DETECTED NOT DETECTED Final   Neisseria meningitidis NOT DETECTED NOT DETECTED Final   Pseudomonas aeruginosa NOT DETECTED NOT DETECTED Final   Candida albicans NOT DETECTED NOT DETECTED Final   Candida glabrata NOT DETECTED NOT DETECTED Final   Candida krusei NOT DETECTED NOT DETECTED Final   Candida parapsilosis NOT DETECTED NOT DETECTED Final   Candida tropicalis NOT DETECTED NOT DETECTED Final    Comment: Performed at [facility], [place] [address], [place], [place] [address]         Radiology Studies: No results found.      Scheduled Meds: . aspirin EC  81 mg Oral Daily  . cholecalciferol  1,000 Units Oral Daily  . cilostazol  100 mg Oral BID  . DULoxetine  40 mg Oral BID  . heparin  5,000 Units Subcutaneous Q8H  . insulin aspart  0-9 Units Subcutaneous TID WC  . insulin glargine  10 Units Subcutaneous BID  . isosorbide mononitrate  60 mg Oral Daily  .  lubiprostone  24 mcg Oral Q breakfast  . pantoprazole  40 mg Oral Daily  . pregabalin  50 mg Oral TID  . tamsulosin  0.4 mg Oral Daily  . vitamin B-12  1,000 mcg Oral Daily   Continuous Infusions: . sodium chloride 125 mL/hr at 12/17/18 0824  .  ceFAZolin (ANCEF) IV       LOS: 3 days   Time spent= 35 mins    Mac Dowdell Arsenio Loader, MD [facility]  If 7PM-7AM, please contact night-coverage [email or web address] 12/17/2018, 12:07 PM

## 2018-12-17 NOTE — Progress Notes (Signed)
Pt complaints of chest pain and discomfort, MD Amin  notify vital were taken BP 160/59, HR 71, rr 27, 02 97% room air, temp 97.4.  EKG taken and placed on his chart. protonix given,  Will continue to monitor pt.

## 2018-12-17 NOTE — Progress Notes (Signed)
Messaged Dr. Reesa Chew. Patient's E. Coli in the bloodstream is Pan Sensitive. Given this information optimized antibiotic therapy from Zosyn to Cefazolin.    Jimmy Footman, PharmD, BCPS, BCIDP Infectious Diseases Clinical Pharmacist Phone: [phone] 11:54 AM 12/17/18

## 2018-12-17 NOTE — Anesthesia Preprocedure Evaluation (Addendum)
Anesthesia Evaluation  Patient identified by MRN, date of birth, ID band Patient awake    Reviewed: Allergy & Precautions, NPO status , Patient's Chart, lab work & pertinent test results  Airway Mallampati: II  TM Distance: >3 FB Neck ROM: Full    Dental no notable dental hx. (+) Teeth Intact, Dental Advisory Given   Pulmonary neg pulmonary ROS, former smoker,    Pulmonary exam normal breath sounds clear to auscultation       Cardiovascular hypertension, + CAD and + CABG (2009)  Normal cardiovascular exam Rhythm:Regular Rate:Normal  LHC 2018 Dist RCA lesion, 70 %stenosed. Origin lesion, 100 %stenosed. Ost 1st Mrg to 1st Mrg lesion, 100 %stenosed. SVG graft was visualized by angiography and is normal in caliber and anatomically normal. Ramus lesion, 99 %stenosed.   Neuro/Psych PSYCHIATRIC DISORDERS negative neurological ROS     GI/Hepatic Neg liver ROS, GERD  Medicated,  Endo/Other  negative endocrine ROSdiabetes, Type 2  Renal/GU Renal InsufficiencyRenal disease  negative genitourinary   Musculoskeletal  (+) Arthritis ,   Abdominal   Peds  Hematology  (+) Blood dyscrasia (Hgb 10.2), anemia ,   Anesthesia Other Findings EUS for RUQ pain and elevated LFTs  Reproductive/Obstetrics                           Anesthesia Physical Anesthesia Plan  ASA: III  Anesthesia Plan: MAC   Post-op Pain Management:    Induction: Intravenous  PONV Risk Score and Plan: 1 and Propofol infusion and Treatment may vary due to age or medical condition  Airway Management Planned: Natural Airway  Additional Equipment:   Intra-op Plan:   Post-operative Plan:   Informed Consent: I have reviewed the patients History and Physical, chart, labs and discussed the procedure including the risks, benefits and alternatives for the proposed anesthesia with the patient or authorized representative who has  indicated his/her understanding and acceptance.     Dental advisory given  Plan Discussed with: CRNA  Anesthesia Plan Comments:         Anesthesia Quick Evaluation

## 2018-12-17 NOTE — Progress Notes (Signed)
Pt denied nausea at this time, still c/o epigastric pain 9/10 described as aching and sharp pain. MD on call paged.

## 2018-12-17 NOTE — Progress Notes (Signed)
Pt NPO after midnight for possible procedure.

## 2018-12-17 NOTE — Progress Notes (Signed)
Reports of some atypical chest discomfort post procedure.  No other complaints. Vitals are stable. Seen at bedside.  EKG- Non ischemic. Will trend CE given his Cardiac hx.  PPI now, Ordered CXR/AXR.   Cont to monitor.   Call with questions if needed.

## 2018-12-17 NOTE — Anesthesia Procedure Notes (Signed)
Procedure Name: MAC Date/Time: 12/17/2018 2:00 PM Performed by: Renato Shin, CRNA Pre-anesthesia Checklist: Patient identified, Emergency Drugs available, Suction available and Patient being monitored Patient Re-evaluated:Patient Re-evaluated prior to induction Oxygen Delivery Method: Nasal cannula Preoxygenation: Pre-oxygenation with 100% oxygen Induction Type: IV induction Placement Confirmation: positive ETCO2 and breath sounds checked- equal and bilateral Dental Injury: Teeth and Oropharynx as per pre-operative assessment

## 2018-12-17 NOTE — Transfer of Care (Signed)
Immediate Anesthesia Transfer of Care Note  Patient: Justin [name]  Procedure(s) Performed: UPPER ENDOSCOPIC ULTRASOUND (EUS) LINEAR (Left )  Patient Location: PACU and Endoscopy Unit  Anesthesia Type:MAC  Level of Consciousness: awake, oriented and patient cooperative  Airway & Oxygen Therapy: Patient Spontanous Breathing and Patient connected to nasal cannula oxygen  Post-op Assessment: Report given to RN and Post -op Vital signs reviewed and stable  Post vital signs: Reviewed and stable  Last Vitals:  Vitals Value Taken Time  BP 106/41 12/17/2018  2:15 PM  Temp    Pulse 82 12/17/2018  2:16 PM  Resp 30 12/17/2018  2:16 PM  SpO2 95 % 12/17/2018  2:16 PM  Vitals shown include unvalidated device data.  Last Pain:  Vitals:   12/17/18 1415  TempSrc: Oral  PainSc: 0-No pain      Patients Stated Pain Goal: 0 (03/22/75 2831)  Complications: No apparent anesthesia complications

## 2018-12-17 NOTE — Op Note (Signed)
[facility] Patient Name: Justin [name] Procedure Date : 12/17/2018 MRN: [id number] Attending MD: [name] , MD Date of Birth: 26-Apr-1933 CSN: [id number] Age: 85 Admit Type: Inpatient Procedure:                Upper EUS Indications:              Abdominal pain in the right upper quadrant Providers:                [name], MD, Burtis Junes, RN, Cherylynn Ridges,                            Technician Referring MD:              Medicines:                Propofol per Anesthesia Complications:            No immediate complications. Estimated Blood Loss:     Estimated blood loss: none. Procedure:                Pre-Anesthesia Assessment:                           - Sedation was administered by an anesthesia                            professional. Deep sedation was attained.                           After obtaining informed consent, the endoscope was                            passed under direct vision. Throughout the                            procedure, the patient's blood pressure, pulse, and                            oxygen saturations were monitored continuously. The                            [id number] ([id number]) Olympus Linear EUS scope was                            introduced through the mouth, and advanced to the                            second part of duodenum. The upper EUS was                            accomplished without difficulty. The patient                            tolerated the procedure well. Scope In: Scope Out: Findings:      ENDOSONOGRAPHIC FINDING: :      Evidence of a previous cholecystectomy was identified       endosonographically.  There was dilation in the common bile duct which measured up to 6 mm -       10 mm.      The examination was negative for any evidence of stones or sludge in the       biliary tract Impression:               - Evidence of a cholecystectomy.                           - There was dilation in the common  bile duct which                            measured up to 6 mm.                           - No specimens collected. Recommendation:           - Return patient to hospital ward for ongoing care.                           - Liver biopsy. Procedure Code(s):        --- Professional ---                           [phone], Esophagogastroduodenoscopy, flexible,                            transoral; with endoscopic ultrasound examination                            limited to the esophagus, stomach or duodenum, and                            adjacent structures Diagnosis Code(s):        --- Professional ---                           Z90.49, Acquired absence of other specified parts                            of digestive tract                           R10.11, Right upper quadrant pain                           K83.8, Other specified diseases of biliary tract CPT copyright 2018 [employer]. All rights reserved. The codes documented in this report are preliminary and upon coder review may  be revised to meet current compliance requirements. [name], MD [name], MD 12/17/2018 2:20:44 PM This report has been signed electronically. Number of Addenda: 0

## 2018-12-17 NOTE — Interval H&P Note (Signed)
History and Physical Interval Note:  12/17/2018 1:43 PM  Justin [name]  has presented today for surgery, with the diagnosis of RUQ pain and abnormal liver enzymes.  The various methods of treatment have been discussed with the patient and family. After consideration of risks, benefits and other options for treatment, the patient has consented to  Procedure(s): UPPER ENDOSCOPIC ULTRASOUND (EUS) LINEAR (Left) as a surgical intervention.  The patient's history has been reviewed, patient examined, no change in status, stable for surgery.  I have reviewed the patient's chart and labs.  Questions were answered to the patient's satisfaction.     Sevilla Murtagh D

## 2018-12-17 NOTE — Progress Notes (Signed)
Pt had one occurrence of clear yellowish emesis, unable to measure amount. He is still c/o epigastric pain.  On call MD notified. Compazine ordered.

## 2018-12-18 ENCOUNTER — Inpatient Hospital Stay (HOSPITAL_COMMUNITY): Payer: Medicare PPO

## 2018-12-18 ENCOUNTER — Encounter (HOSPITAL_COMMUNITY): Payer: Self-pay | Admitting: Gastroenterology

## 2018-12-18 DIAGNOSIS — R1084 Generalized abdominal pain: Secondary | ICD-10-CM

## 2018-12-18 DIAGNOSIS — A419 Sepsis, unspecified organism: Secondary | ICD-10-CM

## 2018-12-18 DIAGNOSIS — R140 Abdominal distension (gaseous): Secondary | ICD-10-CM

## 2018-12-18 LAB — GLUCOSE, CAPILLARY
Glucose-Capillary: 135 mg/dL — ABNORMAL HIGH (ref 70–99)
Glucose-Capillary: 149 mg/dL — ABNORMAL HIGH (ref 70–99)
Glucose-Capillary: 131 mg/dL — ABNORMAL HIGH (ref 70–99)
Glucose-Capillary: 167 mg/dL — ABNORMAL HIGH (ref 70–99)

## 2018-12-18 LAB — URINE CULTURE: Culture: NO GROWTH

## 2018-12-18 LAB — COMPREHENSIVE METABOLIC PANEL
ALT: 178 U/L — ABNORMAL HIGH (ref 0–44)
AST: 93 U/L — ABNORMAL HIGH (ref 15–41)
Albumin: 2.5 g/dL — ABNORMAL LOW (ref 3.5–5.0)
Alkaline Phosphatase: 232 U/L — ABNORMAL HIGH (ref 38–126)
Anion gap: 14 (ref 5–15)
BUN: <5 mg/dL — ABNORMAL LOW (ref 8–23)
CO2: 20 mmol/L — ABNORMAL LOW (ref 22–32)
Calcium: 9.3 mg/dL (ref 8.9–10.3)
Chloride: 104 mmol/L (ref 98–111)
Creatinine, Ser: 1.1 mg/dL (ref 0.61–1.24)
GFR calc Af Amer: >60 mL/min (ref >60)
GFR calc non Af Amer: >60 mL/min (ref >60)
Glucose, Bld: 143 mg/dL — ABNORMAL HIGH (ref 70–99)
Potassium: 3.7 mmol/L (ref 3.5–5.1)
Sodium: 138 mmol/L (ref 135–145)
Total Bilirubin: 2.7 mg/dL — ABNORMAL HIGH (ref 0.3–1.2)
Total Protein: 6.3 g/dL — ABNORMAL LOW (ref 6.5–8.1)

## 2018-12-18 LAB — CBC
HCT: 33.5 % — ABNORMAL LOW (ref 39.0–52.0)
Hemoglobin: 11.1 g/dL — ABNORMAL LOW (ref 13.0–17.0)
MCH: 28 pg (ref 26.0–34.0)
MCHC: 33.1 g/dL (ref 30.0–36.0)
MCV: 84.4 fL (ref 80.0–100.0)
Platelets: 177 10*3/uL (ref 150–400)
RBC: 3.97 MIL/uL — ABNORMAL LOW (ref 4.22–5.81)
RDW: 14.6 % (ref 11.5–15.5)
WBC: 10 10*3/uL (ref 4.0–10.5)
nRBC: 0 % (ref 0.0–0.2)

## 2018-12-18 LAB — TROPONIN I
Troponin I: <0.03 ng/mL (ref <0.03)
Troponin I: <0.03 ng/mL (ref <0.03)

## 2018-12-18 LAB — MAGNESIUM: Magnesium: 1.4 mg/dL — ABNORMAL LOW (ref 1.7–2.4)

## 2018-12-18 MED ORDER — METOCLOPRAMIDE HCL 5 MG/ML IJ SOLN: 10.0000 mg | Freq: Four times a day (QID) | INTRAMUSCULAR | Status: DC | PRN

## 2018-12-18 MED ORDER — MAGNESIUM SULFATE 2 GM/50ML IV SOLN
2.0000 g | Freq: Once | INTRAVENOUS | Status: AC
  Administered 2018-12-18: 2 g via INTRAVENOUS
  Filled 2018-12-18: qty 50

## 2018-12-18 MED ORDER — POTASSIUM CHLORIDE CRYS ER 20 MEQ PO TBCR
40.0000 meq | EXTENDED_RELEASE_TABLET | Freq: Once | ORAL | Status: AC
  Administered 2018-12-18: 40 meq via ORAL
  Filled 2018-12-18: qty 2

## 2018-12-18 MED ORDER — SIMETHICONE 40 MG/0.6ML PO SUSP
40.0000 mg | Freq: Four times a day (QID) | ORAL | Status: DC | PRN
  Administered 2018-12-18 – 2018-12-19 (×2): 40 mg via ORAL
  Filled 2018-12-18 (×4): qty 0.6

## 2018-12-18 NOTE — Progress Notes (Signed)
Pt incontinent of bowels, unable to obtain stool sample at this time. Will attempt again.

## 2018-12-18 NOTE — Progress Notes (Addendum)
Daily Rounding Note Covering for Dr Benson Norway 12/18/2018, 12:12 PM  LOS: 4 days   SUBJECTIVE:   Chief complaint:   Abnormal LFTs.  Nausea, vomiting, loose stools Still feels poorly.  Diffuse abd pain.  Stools loose last PM x 2.   Short of breath when he is moving about in the room but not at rest.  OBJECTIVE:         Vital signs in last 24 hours:    Temp:  [97.4 F (36.3 C)-98.3 F (36.8 C)] 98.3 F (36.8 C) (03/21 0538) Pulse Rate:  [57-85] 61 (03/21 0538) Resp:  [16-28] 18 (03/21 0538) BP: (106-176)/(41-91) 120/91 (03/21 0538) SpO2:  [95 %-100 %] 96 % (03/21 0538) Weight:  [80.5 kg] 80.5 kg (03/20 1319) Last BM Date: 12/17/18 Filed Weights   12/14/18 2110 12/15/18 0117 12/17/18 1319  Weight: 77.1 kg 80.5 kg 80.5 kg   General: Looks unwell. Heart: RRR.  Slight systolic murmur. Chest: Diminished but clear breath sounds. Abdomen: Soft.  Diffuse, mild to moderate tenderness without guarding or rebound. Extremities: No CCE. Neuro/Psych: Oriented x3.  Moves all 4 limbs.  No tremor, no limb weakness.  No gross deficits.  Intake/Output from previous day: 03/20 0701 - 03/21 0700 In: 1320.1 [P.O.:220; I.V.:1000; IV Piggyback:100.1] Out: 1375 [Urine:1375]  Intake/Output this shift: Total I/O In: -  Out: 700 [Urine:700]  Lab Results: Recent Labs    12/16/18 0341 12/17/18 0353 12/18/18 0438  WBC 10.3 8.3 10.0  HGB 9.9* 10.2* 11.1*  HCT 30.6* 30.8* 33.5*  PLT 175 182 177   BMET Recent Labs    12/16/18 0341 12/17/18 0353 12/18/18 0438  NA 139 140 138  K 3.2* 3.6 3.7  CL 112* 111 104  CO2 21* 21* 20*  GLUCOSE 69* 129* 143*  BUN 17 9 <5*  CREATININE 1.62* 1.32* 1.10  CALCIUM 8.4* 8.8* 9.3   LFT Recent Labs    12/16/18 0341 12/17/18 0353 12/18/18 0438  PROT 5.4* 5.8* 6.3*  ALBUMIN 2.2* 2.3* 2.5*  AST 240* 102* 93*  ALT 344* 241* 178*  ALKPHOS 215* 199* 232*  BILITOT 1.7* 0.8 2.7*    PT/INR No results for input(s): LABPROT, INR in the last 72 hours. Hepatitis Panel No results for input(s): HEPBSAG, HCVAB, HEPAIGM, HEPBIGM in the last 72 hours.  Studies/Results: Dg Abd Acute 2+v W 1v Chest  Result Date: 12/17/2018 CLINICAL DATA:  Shortness of breath and abdominal pain EXAM: DG ABDOMEN ACUTE W/ 1V CHEST COMPARISON:  12/14/2018 FINDINGS: Cardiac shadow is stable. Postsurgical changes are again seen. The lungs are well aerated bilaterally with minimal basilar atelectasis particularly on the left. Scattered large and small bowel gas is noted. Endoscopy clip and changes of prior cholecystectomy are seen. No free air is noted. No obstructive changes are seen. Scattered right renal calculi are noted. No acute bony abnormality is seen. IMPRESSION: Chronic appearing changes as described above. No acute abnormality is noted. Electronically Signed   By: Inez Catalina M.D.   On: 12/17/2018 18:55   Scheduled Meds: . aspirin EC  81 mg Oral Daily  . cholecalciferol  1,000 Units Oral Daily  . cilostazol  100 mg Oral BID  . DULoxetine  40 mg Oral BID  . heparin  5,000 Units Subcutaneous Q8H  . insulin aspart  0-9 Units Subcutaneous TID WC  . insulin glargine  10 Units Subcutaneous BID  . isosorbide mononitrate  60 mg Oral Daily  . lubiprostone  24 mcg Oral Q breakfast  . pantoprazole  40 mg Oral Daily  . pregabalin  50 mg Oral TID  . tamsulosin  0.4 mg Oral Daily  . vitamin B-12  1,000 mcg Oral Daily   Continuous Infusions: . sodium chloride 125 mL/hr at 12/18/18 0852  .  ceFAZolin (ANCEF) IV 2 g (12/18/18 0545)   PRN Meds:.acetaminophen, clonazePAM, hydrALAZINE, metoCLOPramide (REGLAN) injection, nitroGLYCERIN, simethicone   ASSESMENT:   *   Upper abdominal pain, nausea, vomiting.  Elevated LFTs.  These date back to middle January 2020.  Normal MRCP then. Cholecystectomy 09/2017 for symptomatic Cholelithiasis. Acute hepatitis panel negative mid 09/2018 APAP level not elevated  IgG 1214, WNL. ANA negative 12/14/2018 abdominal ultrasound.  Unremarkable postcholecystectomy. 12/17/18 upper EUS.  6 - 10 mm CBD postcholecystectomy.  No specimens collected. Liver biopsy discussed.  Deviously arranged outpatient liver biopsy, radiologist do not want to pursue this as an inpatient due to current restrictions on nonemergent procedures.  *    Diarrhea, intermittent, ? side effect of abx  *    E coli UTI.  On Ancef   PLAN   *  Re liver biopsy will defer decision to Dr Benson Norway when he returns on Monday.    *    Supportive care.  *    If he has persistent, significant diarrhea, sitter checking for C. difficile given recent hospitalization and exposure to antibiotic.    Azucena Freed  12/18/2018, 12:12 PM Phone [phone]   Attending physician's note   I have taken an interval history, reviewed the chart and examined the patient. I agree with the Advanced Practitioner's note, impression and recommendations.  Cross cover for Dr. Benson Norway.  85 year old with recurrent episodic abdominal pain with abn LFTs during the episodes.  Neg extensive GI work-up including ultrasound, MRCP, EUS for CBD stones. H/O lap chole in the past.  Neg hepatitis work-up (including autoimmune hepatitis, viral hepatitis, hereditary causes).  Has intermittent diarrhea likely associated with antibiotics.  Diarrhea has resolved (hence C. difficile not obtained)  Plan: Pain control, trend liver function tests, for liver biopsy Monday.  Check PT beforehand.   Carmell Austria, MD

## 2018-12-18 NOTE — Progress Notes (Signed)
PT Cancellation Note  Patient Details Name: Elvert Cumpton MRN: [id number] DOB: [birth year]   Cancelled Treatment:    Reason Eval/Treat Not Completed: Pain limiting ability to participate; patient c/o nausea and abdominal pain.  Denies need to use bathroom and not able to participate at this time.  Will attempt another day.   Reginia Naas 12/18/2018, 3:34 PM Magda Kiel, [place] [phone] 12/18/2018

## 2018-12-18 NOTE — Progress Notes (Signed)
PROGRESS NOTE    Flavius Repsher  JGG:[id number] DOB: 1933/09/16 DOA: 12/14/2018 PCP: [name], MD   Brief Narrative:  85 year old with history of CAD status post CABG, hyperlipidemia, GERD, essential hypertension came to the hospital with complaints of upper abdominal pain, nausea and vomiting.  He was admitted for similar reason about 2 months ago and underwent extensive work-up including MRCP, ultrasound which showed dilated CBD.  Acute hepatitis panel was negative at that time.  Gallbladder stone was removed.  Upon admission he was noted to have significantly elevated LFTs with elevated WBC.  He was started on Zosyn and admitted to the hospital.  IgG levels negative, ANA levels pending.  Blood cultures ended up growing gram-negative rods, ended up being E. coli.  It is pansensitive therefore will change IV Zosyn to cefazolin. EUS- neg. Due to diarrhea- C diff ordered.   Assessment & Plan:   Principal Problem:   Sepsis ([place]) Active Problems:   Hypertension   GERD (gastroesophageal reflux disease)   S/P CABG (coronary artery bypass graft)   CAD (coronary artery disease)   Acute renal failure superimposed on stage 3 chronic kidney disease (HCC)   UTI (urinary tract infection)   HLD (hyperlipidemia)   Abnormal LFTs  Acute hepatitis with elevated LFTs, unknown etiology Abdominal Distention.  - LFTs improving.  S/p EUS- dilation in the common bile duct which measured up to 6 mm. -IgG level-negative, was negative -GI is following.  Procalcitonin level trended down from 20 to 5. Gi Rec- Liver Biopsy, will have to be postponed outpatient due to current restrictions.  -Right upper quadrant-negative for any acute abnormality besides history of cholecystectomy. -Abdominal ultrasound-pending -Antiemetics PRN -PPI  Gram-negative rod bacteremia, E. coli/Enterobacter -Pansensitive on the data.  IV Ancef now -Leukocytosis improving  History of coronary artery disease status post CABG  -Currently chest pain-free.  Continue aspirin and Imdur.  Statin on hold due to abnormal LFTs  Essential hypertension -IV hydralazine PRN.  Antihypertensives on hold due to initial presentation with low blood pressure and concerns for sepsis.  Acute kidney injury on CKD stage III -Admission creatinine 2.0 trended fown to 1.1, baseline 1.5.  With IV fluids it has trended down to 1.96.  Avoid nephrotoxic drugs, gentle hydration.  Diabetes mellitus type 2, insulin-dependent -On Lantus 10 units twice daily, insulin sliding scale and Accu-Chek.  Peripheral neuropathy secondary to diabetes mellitus type 2 -On Lyrica 50 mg 3 times daily  BPH -Continue Flomax  Unsteadiness - PT/OT ordered.   DVT prophylaxis: Subcutaneous heparin Code Status: Full code Family Communication: None at bedside Disposition Plan: Awaiting diarrhea and abd distention to resolve.   Consultants:   Gastroenterology  Procedures:   None  Antimicrobials:   Zosyn day 3 stopped 3/20  Ancef day 2   Subjective: Chest pain has resolved but still reports of abd distention and nausea  Review of Systems Otherwise negative except as per HPI, including: General = no fevers, chills, dizziness, malaise, fatigue HEENT/EYES = negative for pain, redness, loss of vision, double vision, blurred vision, loss of hearing, sore throat, hoarseness, dysphagia Cardiovascular= negative for chest pain, palpitation, murmurs, lower extremity swelling Respiratory/lungs= negative for shortness of breath, cough, hemoptysis, wheezing, mucus production Gastrointestinal= negative for nausea, vomiting,melena, hematemesis Genitourinary= negative for Dysuria, Hematuria, Change in Urinary Frequency MSK = Negative for arthralgia, myalgias, Back Pain, Joint swelling  Neurology= Negative for headache, seizures, numbness, tingling  Psychiatry= Negative for anxiety, depression, suicidal and homocidal ideation Allergy/Immunology=  Medication/Food allergy as listed  Skin= Negative for Rash, lesions, ulcers, itching   Objective: Vitals:   12/17/18 2157 12/17/18 2159 12/17/18 2216 12/18/18 0538  BP: (!) 175/66 (!) 176/72 (!) 170/72 (!) 120/91  Pulse: (!) 59   61  Resp: 16   18  Temp: 98.2 F (36.8 C)   98.3 F (36.8 C)  TempSrc: Oral   Oral  SpO2:    96%  Weight:      Height:        Intake/Output Summary (Last 24 hours) at 12/18/2018 1034 Last data filed at 12/18/2018 2353 Gross per 24 hour  Intake 1320.06 ml  Output 1675 ml  Net -354.94 ml   Filed Weights   12/14/18 2110 12/15/18 0117 12/17/18 1319  Weight: 77.1 kg 80.5 kg 80.5 kg    Examination: Constitutional: NAD, calm, comfortable Eyes: PERRL, lids and conjunctivae normal ENMT: Mucous membranes are moist. Posterior pharynx clear of any exudate or lesions.Normal dentition.  Neck: normal, supple, no masses, no thyromegaly Respiratory: clear to auscultation bilaterally, no wheezing, no crackles. Normal respiratory effort. No accessory muscle use.  Cardiovascular: Regular rate and rhythm, no murmurs / rubs / gallops. No extremity edema. 2+ pedal pulses. No carotid bruits.  Abdomen: no tenderness, no masses palpated. No hepatosplenomegaly. Bowel sounds positive.  Musculoskeletal: no clubbing / cyanosis. No joint deformity upper and lower extremities. Good ROM, no contractures. Normal muscle tone.  Skin: no rashes, lesions, ulcers. No induration Neurologic: CN 2-12 grossly intact. Sensation intact, DTR normal. Strength 4+/5 in all 4.  Psychiatric: Normal judgment and insight. Alert and oriented x 3. Normal mood.   Data Reviewed:   CBC: Recent Labs  Lab 12/14/18 2114 12/15/18 0208 12/16/18 0341 12/17/18 0353 12/18/18 0438  WBC 17.2* 16.4* 10.3 8.3 10.0  NEUTROABS 15.5*  --   --   --   --   HGB 13.6 10.9* 9.9* 10.2* 11.1*  HCT 41.0 33.9* 30.6* 30.8* 33.5*  MCV 85.1 84.8 85.0 85.3 84.4  PLT 239 193 175 182 614   Basic Metabolic Panel:  Recent Labs  Lab 12/14/18 2114 12/15/18 0208 12/16/18 0341 12/17/18 0353 12/18/18 0438  NA 138 138 139 140 138  K 3.5 3.4* 3.2* 3.6 3.7  CL 107 107 112* 111 104  CO2 20* 22 21* 21* 20*  GLUCOSE 182* 168* 69* 129* 143*  BUN 29* 27* 17 9 <5*  CREATININE 2.07* 1.96* 1.62* 1.32* 1.10  CALCIUM 9.4 8.6* 8.4* 8.8* 9.3  MG  --   --  1.7 1.7 1.4*   GFR: Estimated Creatinine Clearance: 51.7 mL/min (by C-G formula based on SCr of 1.1 mg/dL). Liver Function Tests: Recent Labs  Lab 12/14/18 2114 12/15/18 0208 12/16/18 0341 12/17/18 0353 12/18/18 0438  AST 1,099* 665* 240* 102* 93*  ALT 823* 651* 344* 241* 178*  ALKPHOS 355* 291* 215* 199* 232*  BILITOT 2.6* 2.7* 1.7* 0.8 2.7*  PROT 6.7 5.4* 5.4* 5.8* 6.3*  ALBUMIN 2.9* 2.3* 2.2* 2.3* 2.5*   Recent Labs  Lab 12/14/18 2228  LIPASE 35   No results for input(s): AMMONIA in the last 168 hours. Coagulation Profile: Recent Labs  Lab 12/14/18 2228  INR 1.3*   Cardiac Enzymes: Recent Labs  Lab 12/17/18 1813 12/17/18 2347 12/18/18 0438  TROPONINI <0.03 <0.03 <0.03   BNP (last 3 results) No results for input(s): PROBNP in the last 8760 hours. HbA1C: No results for input(s): HGBA1C in the last 72 hours. CBG: Recent Labs  Lab 12/17/18 0754 12/17/18 1159 12/17/18 1730 12/17/18 2159  12/18/18 0755  GLUCAP 98 118* 149* 156* 135*   Lipid Profile: No results for input(s): CHOL, HDL, LDLCALC, TRIG, CHOLHDL, LDLDIRECT in the last 72 hours. Thyroid Function Tests: No results for input(s): TSH, T4TOTAL, FREET4, T3FREE, THYROIDAB in the last 72 hours. Anemia Panel: No results for input(s): VITAMINB12, FOLATE, FERRITIN, TIBC, IRON, RETICCTPCT in the last 72 hours. Sepsis Labs: Recent Labs  Lab 12/14/18 2114 12/14/18 2309 12/15/18 0208 12/17/18 0353  PROCALCITON  --   --  20.13 5.68  LATICACIDVEN 2.9* 3.2* 1.6  --     Recent Results (from the past 240 hour(s))  Blood Culture (routine x 2)     Status: Abnormal    Collection Time: 12/14/18  9:00 PM  Result Value Ref Range Status   Specimen Description BLOOD RIGHT ARM  Final   Special Requests   Final    BOTTLES DRAWN AEROBIC AND ANAEROBIC Blood Culture results may not be optimal due to an excessive volume of blood received in culture bottles   Culture  Setup Time   Final    GRAM NEGATIVE RODS AEROBIC BOTTLE ONLY CRITICAL VALUE NOTED.  VALUE IS CONSISTENT WITH PREVIOUSLY REPORTED AND CALLED VALUE.    Culture (A)  Final    ESCHERICHIA COLI SUSCEPTIBILITIES PERFORMED ON PREVIOUS CULTURE WITHIN THE LAST 5 DAYS. Performed at [facility], [place] [address], [place],  [place] [address]    Report Status 12/17/2018 FINAL  Final  Blood Culture (routine x 2)     Status: Abnormal   Collection Time: 12/14/18  9:15 PM  Result Value Ref Range Status   Specimen Description BLOOD RIGHT HAND  Final   Special Requests   Final    BOTTLES DRAWN AEROBIC ONLY Blood Culture results may not be optimal due to an excessive volume of blood received in culture bottles   Culture  Setup Time   Final    GRAM NEGATIVE RODS AEROBIC BOTTLE ONLY CRITICAL RESULT CALLED TO, READ BACK BY AND VERIFIED WITH: Karlene Einstein PharmD 15:05 12/15/18 ([email or web address]) Performed at [facility], [place] [address], [place], [place] [address]    Culture ESCHERICHIA COLI (A)  Final   Report Status 12/17/2018 FINAL  Final   Organism ID, Bacteria ESCHERICHIA COLI  Final      Susceptibility   Escherichia coli - MIC*    AMPICILLIN 8 SENSITIVE Sensitive     CEFAZOLIN <=4 SENSITIVE Sensitive     CEFEPIME <=1 SENSITIVE Sensitive     CEFTAZIDIME <=1 SENSITIVE Sensitive     CEFTRIAXONE <=1 SENSITIVE Sensitive     CIPROFLOXACIN <=0.25 SENSITIVE Sensitive     GENTAMICIN <=1 SENSITIVE Sensitive     IMIPENEM <=0.25 SENSITIVE Sensitive     TRIMETH/SULFA <=20 SENSITIVE Sensitive     AMPICILLIN/SULBACTAM <=2 SENSITIVE Sensitive     PIP/TAZO <=4 SENSITIVE Sensitive     Extended ESBL NEGATIVE Sensitive      * ESCHERICHIA COLI  Blood Culture ID Panel (Reflexed)     Status: Abnormal   Collection Time: 12/14/18  9:15 PM  Result Value Ref Range Status   Enterococcus species NOT DETECTED NOT DETECTED Final   Listeria monocytogenes NOT DETECTED NOT DETECTED Final   Staphylococcus species NOT DETECTED NOT DETECTED Final   Staphylococcus aureus (BCID) NOT DETECTED NOT DETECTED Final   Streptococcus species NOT DETECTED NOT DETECTED Final   Streptococcus agalactiae NOT DETECTED NOT DETECTED Final   Streptococcus pneumoniae NOT DETECTED NOT DETECTED Final   Streptococcus pyogenes NOT DETECTED NOT  DETECTED Final   Acinetobacter baumannii NOT DETECTED NOT DETECTED Final   Enterobacteriaceae species DETECTED (A) NOT DETECTED Final    Comment: Enterobacteriaceae represent a large family of gram-negative bacteria, not a single organism. CRITICAL RESULT CALLED TO, READ BACK BY AND VERIFIED WITH: Karlene Einstein PharmD 15:05 12/15/18 ([email or web address])    Enterobacter cloacae complex NOT DETECTED NOT DETECTED Final   Escherichia coli DETECTED (A) NOT DETECTED Final    Comment: CRITICAL RESULT CALLED TO, READ BACK BY AND VERIFIED WITH: Karlene Einstein PharmD 15:05 12/15/18 ([email or web address])    Klebsiella oxytoca NOT DETECTED NOT DETECTED Final   Klebsiella pneumoniae NOT DETECTED NOT DETECTED Final   Proteus species NOT DETECTED NOT DETECTED Final   Serratia marcescens NOT DETECTED NOT DETECTED Final   Carbapenem resistance NOT DETECTED NOT DETECTED Final   Haemophilus influenzae NOT DETECTED NOT DETECTED Final   Neisseria meningitidis NOT DETECTED NOT DETECTED Final   Pseudomonas aeruginosa NOT DETECTED NOT DETECTED Final   Candida albicans NOT DETECTED NOT DETECTED Final   Candida glabrata NOT DETECTED NOT DETECTED Final   Candida krusei NOT DETECTED NOT DETECTED Final   Candida parapsilosis NOT DETECTED NOT DETECTED Final   Candida tropicalis NOT DETECTED NOT DETECTED Final    Comment: Performed at [place], [place] [address] [place], [place] [address]  Urine culture     Status: None   Collection Time: 12/17/18  2:08 AM  Result Value Ref Range Status   Specimen Description URINE, CLEAN CATCH  Final   Special Requests NONE  Final   Culture   Final    NO GROWTH Performed at [facility], [place] [address] [place], [place] [address]    Report Status 12/18/2018 FINAL  Final         Radiology Studies: Dg Abd Acute 2+v W 1v Chest  Result Date: 12/17/2018 CLINICAL DATA:  Shortness of breath and abdominal pain EXAM: DG ABDOMEN ACUTE W/ 1V CHEST COMPARISON:  12/14/2018 FINDINGS: Cardiac shadow is stable. Postsurgical changes are again seen. The lungs are well aerated bilaterally with minimal basilar atelectasis particularly on the left. Scattered large and small bowel gas is noted. Endoscopy clip and changes of prior cholecystectomy are seen. No free air is noted. No obstructive changes are seen. Scattered right renal calculi are noted. No acute bony abnormality is seen. IMPRESSION: Chronic appearing changes as described above. No acute abnormality is noted. Electronically Signed   By: Inez Catalina M.D.   On: 12/17/2018 18:55        Scheduled Meds: . aspirin EC  81 mg Oral Daily  . cholecalciferol  1,000 Units Oral Daily  . cilostazol  100 mg Oral BID  . DULoxetine  40 mg Oral BID  . heparin  5,000 Units Subcutaneous Q8H  . insulin aspart  0-9 Units Subcutaneous TID WC  . insulin glargine  10 Units Subcutaneous BID  . isosorbide mononitrate  60 mg Oral Daily  . lubiprostone  24 mcg Oral Q breakfast  . pantoprazole  40 mg Oral Daily  . pregabalin  50 mg Oral TID  . tamsulosin  0.4 mg Oral Daily  . vitamin B-12  1,000 mcg Oral Daily   Continuous Infusions: . sodium chloride 125 mL/hr at 12/18/18 0852  .  ceFAZolin (ANCEF) IV 2 g (12/18/18 0545)     LOS: 4 days   Time spent= 35 mins    Jetaun Colbath Arsenio Loader, MD [facility]  If 7PM-7AM, please contact night-coverage  [email or web address]  12/18/2018, 10:34 AM

## 2018-12-19 ENCOUNTER — Inpatient Hospital Stay (HOSPITAL_COMMUNITY): Payer: Medicare PPO

## 2018-12-19 DIAGNOSIS — K8511 Biliary acute pancreatitis with uninfected necrosis: Secondary | ICD-10-CM

## 2018-12-19 DIAGNOSIS — R945 Abnormal results of liver function studies: Secondary | ICD-10-CM

## 2018-12-19 LAB — GLUCOSE, CAPILLARY
Glucose-Capillary: 199 mg/dL — ABNORMAL HIGH (ref 70–99)
Glucose-Capillary: 183 mg/dL — ABNORMAL HIGH (ref 70–99)
Glucose-Capillary: 118 mg/dL — ABNORMAL HIGH (ref 70–99)
Glucose-Capillary: 136 mg/dL — ABNORMAL HIGH (ref 70–99)

## 2018-12-19 LAB — BILIRUBIN, DIRECT: Bilirubin, Direct: 3.8 mg/dL — ABNORMAL HIGH (ref 0.0–0.2)

## 2018-12-19 LAB — COMPREHENSIVE METABOLIC PANEL
ALT: 111 U/L — ABNORMAL HIGH (ref 0–44)
AST: 87 U/L — ABNORMAL HIGH (ref 15–41)
Albumin: 2.2 g/dL — ABNORMAL LOW (ref 3.5–5.0)
Alkaline Phosphatase: 231 U/L — ABNORMAL HIGH (ref 38–126)
Anion gap: 10 (ref 5–15)
BUN: 7 mg/dL — ABNORMAL LOW (ref 8–23)
CO2: 21 mmol/L — ABNORMAL LOW (ref 22–32)
Calcium: 8.9 mg/dL (ref 8.9–10.3)
Chloride: 103 mmol/L (ref 98–111)
Creatinine, Ser: 1.32 mg/dL — ABNORMAL HIGH (ref 0.61–1.24)
GFR calc Af Amer: 57 mL/min — ABNORMAL LOW (ref >60)
GFR calc non Af Amer: 50 mL/min — ABNORMAL LOW (ref >60)
Glucose, Bld: 228 mg/dL — ABNORMAL HIGH (ref 70–99)
Potassium: 4 mmol/L (ref 3.5–5.1)
Sodium: 134 mmol/L — ABNORMAL LOW (ref 135–145)
Total Bilirubin: 6.6 mg/dL — ABNORMAL HIGH (ref 0.3–1.2)
Total Protein: 6.2 g/dL — ABNORMAL LOW (ref 6.5–8.1)

## 2018-12-19 LAB — MAGNESIUM: Magnesium: 1.6 mg/dL — ABNORMAL LOW (ref 1.7–2.4)

## 2018-12-19 LAB — CBC
HCT: 35.3 % — ABNORMAL LOW (ref 39.0–52.0)
Hemoglobin: 12 g/dL — ABNORMAL LOW (ref 13.0–17.0)
MCH: 27.9 pg (ref 26.0–34.0)
MCHC: 34 g/dL (ref 30.0–36.0)
MCV: 82.1 fL (ref 80.0–100.0)
Platelets: 180 10*3/uL (ref 150–400)
RBC: 4.3 MIL/uL (ref 4.22–5.81)
RDW: 14.6 % (ref 11.5–15.5)
WBC: 16.6 10*3/uL — ABNORMAL HIGH (ref 4.0–10.5)
nRBC: 0 % (ref 0.0–0.2)

## 2018-12-19 LAB — LIPASE, BLOOD: Lipase: 1959 U/L — ABNORMAL HIGH (ref 11–51)

## 2018-12-19 MED ORDER — GADOBUTROL 1 MMOL/ML IV SOLN
8.0000 mL | Freq: Once | INTRAVENOUS | Status: AC | PRN
  Administered 2018-12-19: 8 mL via INTRAVENOUS

## 2018-12-19 MED ORDER — SODIUM CHLORIDE 0.9 % IV SOLN
3.0000 g | Freq: Four times a day (QID) | INTRAVENOUS | Status: DC
  Administered 2018-12-19 – 2018-12-23 (×16): 3 g via INTRAVENOUS
  Filled 2018-12-19 (×19): qty 3

## 2018-12-19 MED ORDER — LORAZEPAM 2 MG/ML IJ SOLN
1.0000 mg | Freq: Once | INTRAMUSCULAR | Status: AC
  Administered 2018-12-19: 1 mg via INTRAVENOUS
  Filled 2018-12-19: qty 1

## 2018-12-19 NOTE — Evaluation (Signed)
Occupational Therapy Evaluation Patient Details Name: Justin [name] MRN: [id number] DOB: 19-Apr-1933 Today's Date: 12/19/2018    History of Present Illness Pt is a 85 y/o with history of CAD status post CABG, hyperlipidemia, GERD, essential hypertension came to the hospital with complaints of upper abdominal pain, nausea and vomiting. Noted recent admission with similar complaints and underwent extensive workup.  S/p upper endoscopic ultrasound 3/20. Workup continued.    Clinical Impression   PTA patient reports independent with ADLs and mobility, using cane as needed.  Patient admitted for above and limited by generalized weakness and decreased activity tolerance.  Patient requires min guard initially fading to supervision for mobility/transfers safety, patient requires UB ADLs with supervision, LB ADLs with min guard to supervision.  Pt will benefit from continued OT services while admitted to maximize independence and safety with ADLs/mobility, but anticipate patient will progress quickly and anticipate no further needs after dc.  Will follow.     Follow Up Recommendations  No OT follow up    Equipment Recommendations  None recommended by OT    Recommendations for Other Services       Precautions / Restrictions Precautions Precautions: Fall Restrictions Weight Bearing Restrictions: No      Mobility Bed Mobility Overal bed mobility: Modified Independent             General bed mobility comments: no assist required  Transfers Overall transfer level: Needs assistance Equipment used: None Transfers: Sit to/from Stand Sit to Stand: Min guard;Supervision         General transfer comment: min guard initally, fading to supervision; good technique and safety awareness     Balance Overall balance assessment: Mild deficits observed, not formally tested                                         ADL either performed or assessed with clinical judgement   ADL  Overall ADL's : Needs assistance/impaired     Grooming: Supervision/safety;Standing;Wash/dry hands;Wash/dry face;Oral care   Upper Body Bathing: Set up;Sitting   Lower Body Bathing: Sit to/from stand;Min guard   Upper Body Dressing : Set up;Sitting   Lower Body Dressing: Supervision/safety;Sit to/from stand   Toilet Transfer: Min guard;Ambulation   Toileting- Clothing Manipulation and Hygiene: Supervision/safety;Sit to/from stand       Functional mobility during ADLs: Min guard;Supervision/safety(min guard fading to supervision ) General ADL Comments: pt limited by generalized weakness, pt progressing well      Vision Baseline Vision/History: Wears glasses Wears Glasses: At all times Patient Visual Report: Blurring of vision Vision Assessment?: No apparent visual deficits     Perception     Praxis      Pertinent Vitals/Pain Pain Assessment: No/denies pain     Hand Dominance Right   Extremity/Trunk Assessment Upper Extremity Assessment Upper Extremity Assessment: Generalized weakness   Lower Extremity Assessment Lower Extremity Assessment: Defer to PT evaluation       Communication Communication Communication: No difficulties   Cognition Arousal/Alertness: Awake/alert Behavior During Therapy: WFL for tasks assessed/performed Overall Cognitive Status: Within Functional Limits for tasks assessed                                     General Comments       Exercises     Shoulder Instructions  Home Living Family/patient expects to be discharged to:: Private residence Living Arrangements: Children(daughter and granddaughter ) Available Help at Discharge: Family;Available 24 hours/day Type of Home: House Home Access: Stairs to enter [employer] of Steps: 1   Home Layout: Two level;Able to live on main level with bedroom/bathroom     Bathroom Shower/Tub: Tub/shower unit(reports usually doing sponge bathing )   Bathroom  Toilet: Standard     Home Equipment: Cane - single point;Walker - 2 wheels          Prior Functioning/Environment Level of Independence: Independent with assistive device(s)        Comments: reports independent mobility in house, outdoors using cane; independent ADLs, no cooking/cleaning, independent medications         OT Problem List: Decreased strength;Decreased activity tolerance;Impaired balance (sitting and/or standing);Decreased knowledge of use of DME or AE;Decreased knowledge of precautions      OT Treatment/Interventions: Self-care/ADL training;DME and/or AE instruction;Energy conservation;Therapeutic exercise;Balance training;Patient/family education;Therapeutic activities    OT Goals(Current goals can be found in the care plan section) Acute Rehab OT Goals Patient Stated Goal: to get home and feel better OT Goal Formulation: With patient Time For Goal Achievement: 01/02/19 Potential to Achieve Goals: Good  OT Frequency: Min 2X/week   Barriers to D/C:            Co-evaluation              AM-PAC OT "6 Clicks" Daily Activity     Outcome Measure Help from another person eating meals?: None Help from another person taking care of personal grooming?: None Help from another person toileting, which includes using toliet, bedpan, or urinal?: None Help from another person bathing (including washing, rinsing, drying)?: A Little Help from another person to put on and taking off regular upper body clothing?: None Help from another person to put on and taking off regular lower body clothing?: None 6 Click Score: 23   End of Session Equipment Utilized During Treatment: Gait belt Nurse Communication: Mobility status  Activity Tolerance: Patient tolerated treatment well Patient left: in chair;with call bell/phone within reach;with chair alarm set  OT Visit Diagnosis: Muscle weakness (generalized) (M62.81)                Time: 7371-0626 OT Time Calculation  (min): 22 min Charges:  OT General Charges $OT Visit: 1 Visit OT Evaluation $OT Eval Moderate Complexity: 1 Mod  Delight Stare, OT Acute Rehabilitation Services Pager [phone] Office [phone]   Delight Stare 12/19/2018, 9:56 AM

## 2018-12-19 NOTE — Progress Notes (Addendum)
PROGRESS NOTE    Lenox Bink  JJK:[id number] DOB: [birth year] DOA: 12/14/2018 PCP: [name], MD   Brief Narrative:  85 year old with history of CAD status post CABG, hyperlipidemia, GERD, essential hypertension came to the hospital with complaints of upper abdominal pain, nausea and vomiting.  He was admitted for similar reason about 2 months ago and underwent extensive work-up including MRCP, ultrasound which showed dilated CBD.  Acute hepatitis panel was negative at that time.  Gallbladder stone was removed.  Upon admission he was noted to have significantly elevated LFTs with elevated WBC.  He was started on Zosyn and admitted to the hospital.  IgG levels negative, ANA levels pending.  Blood cultures ended up growing gram-negative rods, ended up being E. coli.  It is pansensitive therefore will change IV Zosyn to cefazolin. EUS- neg. Due to diarrhea- C diff ordered.   Assessment & Plan:   Principal Problem:   Sepsis ([place]) Active Problems:   Hypertension   GERD (gastroesophageal reflux disease)   S/P CABG (coronary artery bypass graft)   CAD (coronary artery disease)   Acute renal failure superimposed on stage 3 chronic kidney disease (HCC)   UTI (urinary tract infection)   HLD (hyperlipidemia)   Abnormal LFTs  Acute hepatitis with elevated LFTs, unknown etiology Acute pancreatitis Elevated total bilirubin - LFTs improving.  S/p EUS- dilation in the common bile duct which measured up to 6 mm. No stone or sludging seen at that time. -IgG level-negative, was negative -Now with worsening total bilirubin and elevated lipase level,will get MRCP. -Check direct/Indirect bilirubin. Change diet to Clear liquid.  Gi following. -Right upper quadrant-negative for any acute abnormality besides history of cholecystectomy. -Antiemetics PRN -PPI  Gram-negative rod bacteremia, E. coli/Enterobacter -Pansensitive on the data.  IV Ancef, will change to Unasyn for now.  -Leukocytosis  improving  History of coronary artery disease status post CABG -Currently chest pain-free.  Continue aspirin and Imdur.  Statin on hold due to abnormal LFTs  Essential hypertension -IV hydralazine PRN.  Antihypertensives on hold due to initial presentation with low blood pressure and concerns for sepsis.  Acute kidney injury on CKD stage III -Stable for now. Baseline around 1.5  Avoid nephrotoxic drugs, gentle hydration.  Diabetes mellitus type 2, insulin-dependent -On Lantus 10 units twice daily, insulin sliding scale and Accu-Chek.  Peripheral neuropathy secondary to diabetes mellitus type 2 -On Lyrica 50 mg 3 times daily  BPH -Continue Flomax  Unsteadiness - PT/OT ordered.   DVT prophylaxis: Subcutaneous heparin Code Status: Full code Family Communication: None at bedside Disposition Plan: Awaiting diarrhea and abd distention to resolve.   Consultants:   Gastroenterology  Procedures:   None  Antimicrobials:   Zosyn day 3 stopped 3/20  Ancef day 3   Subjective: Mild abdominal discomfort. Generalized. Improved. No other acute events overnight.   Review of Systems Otherwise negative except as per HPI, including: General = no fevers, chills, dizziness, malaise, fatigue HEENT/EYES = negative for pain, redness, loss of vision, double vision, blurred vision, loss of hearing, sore throat, hoarseness, dysphagia Cardiovascular= negative for chest pain, palpitation, murmurs, lower extremity swelling Respiratory/lungs= negative for shortness of breath, cough, hemoptysis, wheezing, mucus production Gastrointestinal= negative for nausea, melena, hematemesis Genitourinary= negative for Dysuria, Hematuria, Change in Urinary Frequency MSK = Negative for arthralgia, myalgias, Back Pain, Joint swelling  Neurology= Negative for headache, seizures, numbness, tingling  Psychiatry= Negative for anxiety, depression, suicidal and homocidal ideation Allergy/Immunology=  Medication/Food allergy as listed  Skin= Negative for Rash, lesions,  ulcers, itching   Objective: Vitals:   12/18/18 0538 12/18/18 1713 12/18/18 2159 12/19/18 0437  BP: (!) 120/91 (!) 185/82 (!) 172/78 138/76  Pulse: 61 73 88 (!) 124  Resp: 18 18 16 16   Temp: 98.3 F (36.8 C) 98.4 F (36.9 C) 99.8 F (37.7 C) 99 F (37.2 C)  TempSrc: Oral  Oral Oral  SpO2: 96% 95% 97% 95%  Weight:      Height:        Intake/Output Summary (Last 24 hours) at 12/19/2018 1030 Last data filed at 12/19/2018 0440 Gross per 24 hour  Intake -  Output 200 ml  Net -200 ml   Filed Weights   12/14/18 2110 12/15/18 0117 12/17/18 1319  Weight: 77.1 kg 80.5 kg 80.5 kg    Examination: Constitutional: NAD, calm, comfortable Eyes: PERRL, lids and conjunctivae normal, scleral icterus ENMT: Mucous membranes are moist. Posterior pharynx clear of any exudate or lesions.Normal dentition.  Neck: normal, supple, no masses, no thyromegaly Respiratory: clear to auscultation bilaterally, no wheezing, no crackles. Normal respiratory effort. No accessory muscle use.  Cardiovascular: Regular rate and rhythm, no murmurs / rubs / gallops. No extremity edema. 2+ pedal pulses. No carotid bruits.  Abdomen: no tenderness, no masses palpated. No hepatosplenomegaly. Bowel sounds positive.  Musculoskeletal: no clubbing / cyanosis. No joint deformity upper and lower extremities. Good ROM, no contractures. Normal muscle tone.  Skin: no rashes, lesions, ulcers. No induration Neurologic: CN 2-12 grossly intact. Sensation intact, DTR normal. Strength 4+/5 in all 4.  Psychiatric: Normal judgment and insight. Alert and oriented x 3. Normal mood.    Data Reviewed:   CBC: Recent Labs  Lab 12/14/18 2114 12/15/18 0208 12/16/18 0341 12/17/18 0353 12/18/18 0438 12/19/18 0444  WBC 17.2* 16.4* 10.3 8.3 10.0 16.6*  NEUTROABS 15.5*  --   --   --   --   --   HGB 13.6 10.9* 9.9* 10.2* 11.1* 12.0*  HCT 41.0 33.9* 30.6* 30.8* 33.5*  35.3*  MCV 85.1 84.8 85.0 85.3 84.4 82.1  PLT 239 193 175 182 177 903   Basic Metabolic Panel: Recent Labs  Lab 12/15/18 0208 12/16/18 0341 12/17/18 0353 12/18/18 0438 12/19/18 0444  NA 138 139 140 138 134*  K 3.4* 3.2* 3.6 3.7 4.0  CL 107 112* 111 104 103  CO2 22 21* 21* 20* 21*  GLUCOSE 168* 69* 129* 143* 228*  BUN 27* 17 9 <5* 7*  CREATININE 1.96* 1.62* 1.32* 1.10 1.32*  CALCIUM 8.6* 8.4* 8.8* 9.3 8.9  MG  --  1.7 1.7 1.4* 1.6*   GFR: Estimated Creatinine Clearance: 43.1 mL/min (A) (by C-G formula based on SCr of 1.32 mg/dL (H)). Liver Function Tests: Recent Labs  Lab 12/15/18 0208 12/16/18 0341 12/17/18 0353 12/18/18 0438 12/19/18 0444  AST 665* 240* 102* 93* 87*  ALT 651* 344* 241* 178* 111*  ALKPHOS 291* 215* 199* 232* 231*  BILITOT 2.7* 1.7* 0.8 2.7* 6.6*  PROT 5.4* 5.4* 5.8* 6.3* 6.2*  ALBUMIN 2.3* 2.2* 2.3* 2.5* 2.2*   Recent Labs  Lab 12/14/18 2228 12/19/18 0444  LIPASE 35 1,959*   No results for input(s): AMMONIA in the last 168 hours. Coagulation Profile: Recent Labs  Lab 12/14/18 2228  INR 1.3*   Cardiac Enzymes: Recent Labs  Lab 12/17/18 1813 12/17/18 2347 12/18/18 0438  TROPONINI <0.03 <0.03 <0.03   BNP (last 3 results) No results for input(s): PROBNP in the last 8760 hours. HbA1C: No results for input(s): HGBA1C in the last  72 hours. CBG: Recent Labs  Lab 12/18/18 0755 12/18/18 1248 12/18/18 1709 12/18/18 2139 12/19/18 0807  GLUCAP 135* 149* 131* 167* 199*   Lipid Profile: No results for input(s): CHOL, HDL, LDLCALC, TRIG, CHOLHDL, LDLDIRECT in the last 72 hours. Thyroid Function Tests: No results for input(s): TSH, T4TOTAL, FREET4, T3FREE, THYROIDAB in the last 72 hours. Anemia Panel: No results for input(s): VITAMINB12, FOLATE, FERRITIN, TIBC, IRON, RETICCTPCT in the last 72 hours. Sepsis Labs: Recent Labs  Lab 12/14/18 2114 12/14/18 2309 12/15/18 0208 12/17/18 0353  PROCALCITON  --   --  20.13 5.68   LATICACIDVEN 2.9* 3.2* 1.6  --     Recent Results (from the past 240 hour(s))  Blood Culture (routine x 2)     Status: Abnormal   Collection Time: 12/14/18  9:00 PM  Result Value Ref Range Status   Specimen Description BLOOD RIGHT ARM  Final   Special Requests   Final    BOTTLES DRAWN AEROBIC AND ANAEROBIC Blood Culture results may not be optimal due to an excessive volume of blood received in culture bottles   Culture  Setup Time   Final    GRAM NEGATIVE RODS AEROBIC BOTTLE ONLY CRITICAL VALUE NOTED.  VALUE IS CONSISTENT WITH PREVIOUSLY REPORTED AND CALLED VALUE.    Culture (A)  Final    ESCHERICHIA COLI SUSCEPTIBILITIES PERFORMED ON PREVIOUS CULTURE WITHIN THE LAST 5 DAYS. Performed at [facility], [place] [address] [place], [place] [address]    Report Status 12/17/2018 FINAL  Final  Blood Culture (routine x 2)     Status: Abnormal   Collection Time: 12/14/18  9:15 PM  Result Value Ref Range Status   Specimen Description BLOOD RIGHT HAND  Final   Special Requests   Final    BOTTLES DRAWN AEROBIC ONLY Blood Culture results may not be optimal due to an excessive volume of blood received in culture bottles   Culture  Setup Time   Final    GRAM NEGATIVE RODS AEROBIC BOTTLE ONLY CRITICAL RESULT CALLED TO, READ BACK BY AND VERIFIED WITH: Karlene Einstein PharmD 15:05 12/15/18 ([email or web address]) Performed at [facility], [place] [address], [place], [place] [address]    Culture ESCHERICHIA COLI (A)  Final   Report Status 12/17/2018 FINAL  Final   Organism ID, Bacteria ESCHERICHIA COLI  Final      Susceptibility   Escherichia coli - MIC*    AMPICILLIN 8 SENSITIVE Sensitive     CEFAZOLIN <=4 SENSITIVE Sensitive     CEFEPIME <=1 SENSITIVE Sensitive     CEFTAZIDIME <=1 SENSITIVE Sensitive     CEFTRIAXONE <=1 SENSITIVE Sensitive     CIPROFLOXACIN <=0.25 SENSITIVE Sensitive     GENTAMICIN <=1 SENSITIVE Sensitive     IMIPENEM <=0.25 SENSITIVE Sensitive     TRIMETH/SULFA <=20 SENSITIVE  Sensitive     AMPICILLIN/SULBACTAM <=2 SENSITIVE Sensitive     PIP/TAZO <=4 SENSITIVE Sensitive     Extended ESBL NEGATIVE Sensitive     * ESCHERICHIA COLI  Blood Culture ID Panel (Reflexed)     Status: Abnormal   Collection Time: 12/14/18  9:15 PM  Result Value Ref Range Status   Enterococcus species NOT DETECTED NOT DETECTED Final   Listeria monocytogenes NOT DETECTED NOT DETECTED Final   Staphylococcus species NOT DETECTED NOT DETECTED Final   Staphylococcus aureus (BCID) NOT DETECTED NOT DETECTED Final   Streptococcus species NOT DETECTED NOT DETECTED Final   Streptococcus agalactiae NOT DETECTED NOT DETECTED Final  Streptococcus pneumoniae NOT DETECTED NOT DETECTED Final   Streptococcus pyogenes NOT DETECTED NOT DETECTED Final   Acinetobacter baumannii NOT DETECTED NOT DETECTED Final   Enterobacteriaceae species DETECTED (A) NOT DETECTED Final    Comment: Enterobacteriaceae represent a large family of gram-negative bacteria, not a single organism. CRITICAL RESULT CALLED TO, READ BACK BY AND VERIFIED WITH: Karlene Einstein PharmD 15:05 12/15/18 ([email or web address])    Enterobacter cloacae complex NOT DETECTED NOT DETECTED Final   Escherichia coli DETECTED (A) NOT DETECTED Final    Comment: CRITICAL RESULT CALLED TO, READ BACK BY AND VERIFIED WITH: Karlene Einstein PharmD 15:05 12/15/18 ([email or web address])    Klebsiella oxytoca NOT DETECTED NOT DETECTED Final   Klebsiella pneumoniae NOT DETECTED NOT DETECTED Final   Proteus species NOT DETECTED NOT DETECTED Final   Serratia marcescens NOT DETECTED NOT DETECTED Final   Carbapenem resistance NOT DETECTED NOT DETECTED Final   Haemophilus influenzae NOT DETECTED NOT DETECTED Final   Neisseria meningitidis NOT DETECTED NOT DETECTED Final   Pseudomonas aeruginosa NOT DETECTED NOT DETECTED Final   Candida albicans NOT DETECTED NOT DETECTED Final   Candida glabrata NOT DETECTED NOT DETECTED Final   Candida krusei NOT DETECTED NOT DETECTED Final   Candida  parapsilosis NOT DETECTED NOT DETECTED Final   Candida tropicalis NOT DETECTED NOT DETECTED Final    Comment: Performed at [facility], [place] [address] [place], [place] [address]  Urine culture     Status: None   Collection Time: 12/17/18  2:08 AM  Result Value Ref Range Status   Specimen Description URINE, CLEAN CATCH  Final   Special Requests NONE  Final   Culture   Final    NO GROWTH Performed at [facility], [place] [address] [place], [place] [address]    Report Status 12/18/2018 FINAL  Final         Radiology Studies: Dg Abd 1 View  Result Date: 12/18/2018 CLINICAL DATA:  Abdominal distension EXAM: ABDOMEN - 1 VIEW COMPARISON:  12/17/2018 FINDINGS: Scattered large and small bowel gas is seen. No obstructive changes are identified. No free air is seen. No abnormal mass is noted. Degenerative changes of lumbar spine are noted. IMPRESSION: No acute abnormality seen Electronically Signed   By: Inez Catalina M.D.   On: 12/18/2018 16:21   Dg Abd Acute 2+v W 1v Chest  Result Date: 12/17/2018 CLINICAL DATA:  Shortness of breath and abdominal pain EXAM: DG ABDOMEN ACUTE W/ 1V CHEST COMPARISON:  12/14/2018 FINDINGS: Cardiac shadow is stable. Postsurgical changes are again seen. The lungs are well aerated bilaterally with minimal basilar atelectasis particularly on the left. Scattered large and small bowel gas is noted. Endoscopy clip and changes of prior cholecystectomy are seen. No free air is noted. No obstructive changes are seen. Scattered right renal calculi are noted. No acute bony abnormality is seen. IMPRESSION: Chronic appearing changes as described above. No acute abnormality is noted. Electronically Signed   By: Inez Catalina M.D.   On: 12/17/2018 18:55        Scheduled Meds: . aspirin EC  81 mg Oral Daily  . cholecalciferol  1,000 Units Oral Daily  . cilostazol  100 mg Oral BID  . DULoxetine  40 mg Oral BID  . heparin  5,000 Units Subcutaneous Q8H  . insulin  aspart  0-9 Units Subcutaneous TID WC  . insulin glargine  10 Units Subcutaneous BID  . isosorbide mononitrate  60 mg Oral Daily  . lubiprostone  24 mcg  Oral Q breakfast  . pantoprazole  40 mg Oral Daily  . pregabalin  50 mg Oral TID  . tamsulosin  0.4 mg Oral Daily  . vitamin B-12  1,000 mcg Oral Daily   Continuous Infusions: . sodium chloride 125 mL/hr at 12/18/18 0852  .  ceFAZolin (ANCEF) IV 2 g (12/19/18 0603)     LOS: 5 days   Time spent= 30 mins    Ankit Arsenio Loader, MD [facility]  If 7PM-7AM, please contact night-coverage [email or web address] 12/19/2018, 10:30 AM

## 2018-12-19 NOTE — Progress Notes (Signed)
[facility] Progress Note     ASSESSMENT AND PLAN:   Acute biliary pancreatitis with abnormal LFTs.  MRCP showing 64mm stone in CBD with bile ductal dilatation. On antibiotics.  Plan: ERCP in AM (Dr Benson Norway) Continue antibiotics IV fluids Pain control Trend lipase, LFTs  D/w Dr Collene Mares.  Tried calling Dr. Benson Norway   SUBJECTIVE/OBJECTIVE:   Had abdominal pain this morning with abnormal liver function tests and elevated lipase.  I saw him before he went for his MRCP.  No fever   Vital signs in last 24 hours: Temp:  [98.8 F (37.1 C)-99.8 F (37.7 C)] 98.8 F (37.1 C) (03/22 1435) Pulse Rate:  [79-131] 79 (03/22 1438) Resp:  [16-22] 22 (03/22 1435) BP: (97-172)/(62-78) 97/62 (03/22 1435) SpO2:  [95 %-98 %] 98 % (03/22 1435) Last BM Date: 12/17/18 General:   Alert, well-developed,    in NAD EENT:  Normal hearing, mild sclera, conjunctive pink.  Heart:  Regular rate and rhythm; no murmurs.  Pulm: Normal respiratory effort, lungs CTA bilaterally without wheezes or crackles. Abdomen:  Soft, nondistended, nontender.  Normal bowel sounds, no masses felt. No hepatomegaly.    Neurologic:  Alert and  oriented x4;  grossly normal neurologically. Psych:  Pleasant, cooperative.  Normal mood and affect.   Intake/Output from previous day: 03/21 0701 - 03/22 0700 In: -  Out: 900 [Urine:900] Intake/Output this shift: No intake/output data recorded.  Lab Results: Recent Labs    12/17/18 0353 12/18/18 0438 12/19/18 0444  WBC 8.3 10.0 16.6*  HGB 10.2* 11.1* 12.0*  HCT 30.8* 33.5* 35.3*  PLT 182 177 180   BMET Recent Labs    12/17/18 0353 12/18/18 0438 12/19/18 0444  NA 140 138 134*  K 3.6 3.7 4.0  CL 111 104 103  CO2 21* 20* 21*  GLUCOSE 129* 143* 228*  BUN 9 <5* 7*  CREATININE 1.32* 1.10 1.32*  CALCIUM 8.8* 9.3 8.9   LFT Recent Labs    12/19/18 0444  PROT 6.2*  ALBUMIN 2.2*  AST 87*  ALT 111*  ALKPHOS 231*  BILITOT 6.6*  BILIDIR 3.8*    PT/INR No results for input(s): LABPROT, INR in the last 72 hours. Hepatitis Panel No results for input(s): HEPBSAG, HCVAB, HEPAIGM, HEPBIGM in the last 72 hours.  Dg Abd 1 View  Result Date: 12/18/2018 CLINICAL DATA:  Abdominal distension EXAM: ABDOMEN - 1 VIEW COMPARISON:  12/17/2018 FINDINGS: Scattered large and small bowel gas is seen. No obstructive changes are identified. No free air is seen. No abnormal mass is noted. Degenerative changes of lumbar spine are noted. IMPRESSION: No acute abnormality seen Electronically Signed   By: Inez Catalina M.D.   On: 12/18/2018 16:21   Mr 3d Recon At Scanner  Result Date: 12/19/2018 CLINICAL DATA:  Worsening total bilirubin with elevated lipase. EXAM: MRI ABDOMEN WITHOUT AND WITH CONTRAST (INCLUDING MRCP) TECHNIQUE: Multiplanar multisequence MR imaging of the abdomen was performed both before and after the administration of intravenous contrast. Heavily T2-weighted images of the biliary and pancreatic ducts were obtained, and three-dimensional MRCP images were rendered by post processing. CONTRAST:  8 cc Gadavist. COMPARISON:  10/13/2018.  Abdomen pelvis CT 10/12/2018. FINDINGS: Lower chest: Basilar atelectasis with tiny left effusion. Hepatobiliary: Motion degraded study with heterogeneous enhancement of liver parenchyma. No definite enhancing liver mass. Gallbladder surgically absent. Extrahepatic common duct measures 9 mm diameter. Common bile duct in the head of the pancreas is 7 mm diameter. 4 x 4 x 7 mm  stone is identified in the distal common bile duct (axial haste image 29/series 4, axial T2 image 30/series 6, coronal MRCP image 32/series 10). Pancreas: No focal mass lesion. No dilatation of the main duct. No intraparenchymal cyst. No peripancreatic edema. Spleen:  No splenomegaly. No focal mass lesion. Adrenals/Urinary Tract: No adrenal nodule or mass. Kidneys unremarkable Stomach/Bowel: Small hiatal hernia. Stomach otherwise unremarkable. Duodenum  is normally positioned as is the ligament of Treitz. No small bowel or colonic dilatation within the visualized abdomen. Vascular/Lymphatic: No abdominal aortic aneurysm. No abdominal lymphadenopathy. Other:  No intraperitoneal free fluid. Musculoskeletal: No abnormal marrow enhancement within the visualized bony anatomy. Bilateral gynecomastia evident. IMPRESSION: 1. 4 x 4 x 7 mm stone in the distal common bile duct with mild extrahepatic biliary duct distension. 2. No associated pancreatic ductal dilatation. 3. Small hiatal hernia. Electronically Signed   By: Misty Stanley M.D.   On: 12/19/2018 16:49   Mr Abdomen Mrcp Moise Boring Contast  Result Date: 12/19/2018 CLINICAL DATA:  Worsening total bilirubin with elevated lipase. EXAM: MRI ABDOMEN WITHOUT AND WITH CONTRAST (INCLUDING MRCP) TECHNIQUE: Multiplanar multisequence MR imaging of the abdomen was performed both before and after the administration of intravenous contrast. Heavily T2-weighted images of the biliary and pancreatic ducts were obtained, and three-dimensional MRCP images were rendered by post processing. CONTRAST:  8 cc Gadavist. COMPARISON:  10/13/2018.  Abdomen pelvis CT 10/12/2018. FINDINGS: Lower chest: Basilar atelectasis with tiny left effusion. Hepatobiliary: Motion degraded study with heterogeneous enhancement of liver parenchyma. No definite enhancing liver mass. Gallbladder surgically absent. Extrahepatic common duct measures 9 mm diameter. Common bile duct in the head of the pancreas is 7 mm diameter. 4 x 4 x 7 mm stone is identified in the distal common bile duct (axial haste image 29/series 4, axial T2 image 30/series 6, coronal MRCP image 32/series 10). Pancreas: No focal mass lesion. No dilatation of the main duct. No intraparenchymal cyst. No peripancreatic edema. Spleen:  No splenomegaly. No focal mass lesion. Adrenals/Urinary Tract: No adrenal nodule or mass. Kidneys unremarkable Stomach/Bowel: Small hiatal hernia. Stomach otherwise  unremarkable. Duodenum is normally positioned as is the ligament of Treitz. No small bowel or colonic dilatation within the visualized abdomen. Vascular/Lymphatic: No abdominal aortic aneurysm. No abdominal lymphadenopathy. Other:  No intraperitoneal free fluid. Musculoskeletal: No abnormal marrow enhancement within the visualized bony anatomy. Bilateral gynecomastia evident. IMPRESSION: 1. 4 x 4 x 7 mm stone in the distal common bile duct with mild extrahepatic biliary duct distension. 2. No associated pancreatic ductal dilatation. 3. Small hiatal hernia. Electronically Signed   By: Misty Stanley M.D.   On: 12/19/2018 16:49      Principal Problem:   Sepsis ([place]) Active Problems:   Hypertension   GERD (gastroesophageal reflux disease)   S/P CABG (coronary artery bypass graft)   CAD (coronary artery disease)   Acute renal failure superimposed on stage 3 chronic kidney disease (HCC)   UTI (urinary tract infection)   HLD (hyperlipidemia)   Abnormal LFTs     LOS: 5 days   Jackquline Denmark ,NP 12/19/2018, 7:04 PM  Pager number [phone]

## 2018-12-19 NOTE — Progress Notes (Signed)
Pharmacy Antibiotic Note  Justin [name] is a 85 y.o. male admitted on 12/14/2018 with Intra-Abdominal Infection.  Pharmacy has been consulted for Unasyn dosing. Patient has been on IV cefazolin for e.coli / enterobacter bacteremia. Patient's WBC elevated today at 16.6, up from 10.0 yesterday, scr worsening from 1.10 to 1.32, patient is afebrile but with increased heart rate at 124 bpm, respiratory rate at 16.    Plan: Stop Cefazolin  Start Unasyn 3g IV every 6 hours Follow renal function, clinical improvement, signs/symptoms of infection worsening.     Height: 5\' 7"  (170.2 cm) Weight: 177 lb 7.5 oz (80.5 kg) IBW/kg (Calculated) : 66.1  Temp (24hrs), Avg:99.1 F (37.3 C), Min:98.4 F (36.9 C), Max:99.8 F (37.7 C)  Recent Labs  Lab 12/14/18 2114 12/14/18 2309 12/15/18 0208 12/16/18 0341 12/17/18 0353 12/18/18 0438 12/19/18 0444  WBC 17.2*  --  16.4* 10.3 8.3 10.0 16.6*  CREATININE 2.07*  --  1.96* 1.62* 1.32* 1.10 1.32*  LATICACIDVEN 2.9* 3.2* 1.6  --   --   --   --     Estimated Creatinine Clearance: 43.1 mL/min (A) (by C-G formula based on SCr of 1.32 mg/dL (H)).    Allergies  Allergen Reactions  . Dye Fdc Red [Red Dye] Swelling and Other (See Comments)    CAT scan dye  . Ibuprofen Other (See Comments)    Upset stomach     Antimicrobials this admission: Azithromycin 3/17 x1 Ceftriaxone 3/17 x1 Zosyn 3/18 >> 3/20 Cefazolin 3/20 >> 3/22 Unasyn 3/22 >>  Dose adjustments this admission: n/a  Microbiology results: 3/17 Flu negative 3/17 BCx: e.coli - pan-sensitive 3/20 UCx: negative  C.dif pcr: sent  Thank you for allowing pharmacy to be a part of this patient's care.  Tamela Gammon, PharmD 12/19/2018 11:57 AM PGY-1 Pharmacy Resident Direct Phone: [phone] Please check [email or web address] for unit-specific pharmacist phone numbers

## 2018-12-19 NOTE — Anesthesia Postprocedure Evaluation (Signed)
Anesthesia Post Note  Patient: Mckade Gurka  Procedure(s) Performed: UPPER ENDOSCOPIC ULTRASOUND (EUS) LINEAR (Left ) ESOPHAGOGASTRODUODENOSCOPY (EGD) WITH PROPOFOL (N/A )     Patient location during evaluation: Endoscopy Anesthesia Type: MAC Level of consciousness: awake and alert Pain management: pain level controlled Vital Signs Assessment: post-procedure vital signs reviewed and stable Respiratory status: spontaneous breathing, nonlabored ventilation, respiratory function stable and patient connected to nasal cannula oxygen Cardiovascular status: stable and blood pressure returned to baseline Postop Assessment: no apparent nausea or vomiting Anesthetic complications: no    Last Vitals:  Vitals:   12/19/18 1435 12/19/18 1438  BP: 97/62   Pulse: (!) 131 79  Resp: (!) 22   Temp: 37.1 C   SpO2: 98%     Last Pain:  Vitals:   12/19/18 1435  TempSrc: Oral  PainSc:                  Chelsey L Woodrum

## 2018-12-19 NOTE — Evaluation (Signed)
Physical Therapy Evaluation Patient Details Name: Justin [name] MRN: [id number] DOB: 05/21/1933 Today's Date: 12/19/2018   History of Present Illness  Pt is a 85 y/o with history of CAD status post CABG, hyperlipidemia, GERD, essential hypertension came to the hospital with complaints of upper abdominal pain, nausea and vomiting. Noted recent admission with similar complaints and underwent extensive workup.  S/p upper endoscopic ultrasound 3/20. Workup continued.      Clinical Impression  Patient evaluated by Physical Therapy with no further acute PT needs identified. All education has been completed and the patient has no further questions. PTA, pt independent living with daugther. Today ambulating without assistance VSS.  See below for any follow-up Physical Therapy or equipment needs. PT is signing off. Thank you for this referral.     Follow Up Recommendations  No PT Follow up    Equipment Recommendations       Recommendations for Other Services       Precautions / Restrictions Precautions Precautions: Fall Restrictions Weight Bearing Restrictions: No      Mobility  Bed Mobility Overal bed mobility: Modified Independent             General bed mobility comments: no assist required  Transfers Overall transfer level: Needs assistance Equipment used: None Transfers: Sit to/from Stand Sit to Stand: Min guard;Supervision         General transfer comment: min guard initally, fading to supervision; good technique and safety awareness   Ambulation/Gait Ambulation/Gait assistance: Modified independent (Device/Increase time) Gait Distance (Feet): 50 Feet            Stairs            Wheelchair Mobility    Modified Rankin (Stroke Patients Only)       Balance Overall balance assessment: Mild deficits observed, not formally tested                                           Pertinent Vitals/Pain      Home Living Family/patient  expects to be discharged to:: Private residence Living Arrangements: Children(daughter and granddaughter ) Available Help at Discharge: Family;Available 24 hours/day Type of Home: House Home Access: Stairs to enter   [employer] of Steps: 1 Home Layout: Two level;Able to live on main level with bedroom/bathroom Home Equipment: Kasandra Knudsen - single point;Walker - 2 wheels      Prior Function Level of Independence: Independent with assistive device(s)         Comments: reports independent mobility in house, outdoors using cane; independent ADLs, no cooking/cleaning, independent medications      Hand Dominance   Dominant Hand: Right    Extremity/Trunk Assessment   Upper Extremity Assessment Upper Extremity Assessment: Generalized weakness    Lower Extremity Assessment Lower Extremity Assessment: Defer to PT evaluation       Communication   Communication: No difficulties  Cognition Arousal/Alertness: Awake/alert Behavior During Therapy: WFL for tasks assessed/performed Overall Cognitive Status: Within Functional Limits for tasks assessed                                        General Comments      Exercises     Assessment/Plan    PT Assessment Patent does not need any further PT services  PT Problem List  PT Treatment Interventions DME instruction;Gait training;Stair training;Functional mobility training;Therapeutic activities;Therapeutic exercise;Balance training    PT Goals (Current goals can be found in the Care Plan section)  Acute Rehab PT Goals Patient Stated Goal: to get home and feel better    Frequency Min 3X/week   Barriers to discharge        Co-evaluation               AM-PAC PT "6 Clicks" Mobility  Outcome Measure Help needed turning from your back to your side while in a flat bed without using bedrails?: None Help needed moving from lying on your back to sitting on the side of a flat bed without using  bedrails?: None Help needed moving to and from a bed to a chair (including a wheelchair)?: None Help needed standing up from a chair using your arms (e.g., wheelchair or bedside chair)?: A Little Help needed to walk in hospital room?: A Little Help needed climbing 3-5 steps with a railing? : A Little 6 Click Score: 21    End of Session Equipment Utilized During Treatment: Gait belt Activity Tolerance: Patient tolerated treatment well Patient left: in bed Nurse Communication: Mobility status PT Visit Diagnosis: Unsteadiness on feet (R26.81)    Time: 1645-1700 PT Time Calculation (min) (ACUTE ONLY): 15 min   Charges:   PT Evaluation $PT Eval Low Complexity: 1 Low          Reinaldo Berber, PT, DPT Acute Rehabilitation Services Pager: [phone] Office: [phone]    Reinaldo Berber 12/19/2018, 5:41 PM

## 2018-12-20 DIAGNOSIS — K8051 Calculus of bile duct without cholangitis or cholecystitis with obstruction: Secondary | ICD-10-CM

## 2018-12-20 DIAGNOSIS — R140 Abdominal distension (gaseous): Secondary | ICD-10-CM

## 2018-12-20 LAB — COMPREHENSIVE METABOLIC PANEL WITH GFR
ALT: 56 U/L — ABNORMAL HIGH (ref 0–44)
AST: 67 U/L — ABNORMAL HIGH (ref 15–41)
Albumin: 1.9 g/dL — ABNORMAL LOW (ref 3.5–5.0)
Alkaline Phosphatase: 166 U/L — ABNORMAL HIGH (ref 38–126)
Anion gap: 9 (ref 5–15)
BUN: 15 mg/dL (ref 8–23)
CO2: 20 mmol/L — ABNORMAL LOW (ref 22–32)
Calcium: 8.4 mg/dL — ABNORMAL LOW (ref 8.9–10.3)
Chloride: 109 mmol/L (ref 98–111)
Creatinine, Ser: 1.71 mg/dL — ABNORMAL HIGH (ref 0.61–1.24)
GFR calc Af Amer: 42 mL/min — ABNORMAL LOW
GFR calc non Af Amer: 36 mL/min — ABNORMAL LOW
Glucose, Bld: 141 mg/dL — ABNORMAL HIGH (ref 70–99)
Potassium: 3.1 mmol/L — ABNORMAL LOW (ref 3.5–5.1)
Sodium: 138 mmol/L (ref 135–145)
Total Bilirubin: 4.5 mg/dL — ABNORMAL HIGH (ref 0.3–1.2)
Total Protein: 5.1 g/dL — ABNORMAL LOW (ref 6.5–8.1)

## 2018-12-20 LAB — CBC
HCT: 26.5 % — ABNORMAL LOW (ref 39.0–52.0)
Hemoglobin: 9.1 g/dL — ABNORMAL LOW (ref 13.0–17.0)
MCH: 28.4 pg (ref 26.0–34.0)
MCHC: 34.3 g/dL (ref 30.0–36.0)
MCV: 82.8 fL (ref 80.0–100.0)
Platelets: 142 K/uL — ABNORMAL LOW (ref 150–400)
RBC: 3.2 MIL/uL — ABNORMAL LOW (ref 4.22–5.81)
RDW: 15 % (ref 11.5–15.5)
WBC: 10 K/uL (ref 4.0–10.5)
nRBC: 0 % (ref 0.0–0.2)

## 2018-12-20 LAB — GLUCOSE, CAPILLARY
Glucose-Capillary: 99 mg/dL (ref 70–99)
Glucose-Capillary: 114 mg/dL — ABNORMAL HIGH (ref 70–99)
Glucose-Capillary: 123 mg/dL — ABNORMAL HIGH (ref 70–99)
Glucose-Capillary: 63 mg/dL — ABNORMAL LOW (ref 70–99)
Glucose-Capillary: 146 mg/dL — ABNORMAL HIGH (ref 70–99)

## 2018-12-20 LAB — MAGNESIUM: Magnesium: 1.7 mg/dL (ref 1.7–2.4)

## 2018-12-20 MED ORDER — POTASSIUM CHLORIDE 10 MEQ/100ML IV SOLN
10.0000 meq | INTRAVENOUS | Status: AC
  Administered 2018-12-20 (×6): 10 meq via INTRAVENOUS
  Filled 2018-12-20 (×6): qty 100

## 2018-12-20 MED ORDER — DEXTROSE 50 % IV SOLN
INTRAVENOUS | Status: AC
  Administered 2018-12-20: 50 mL
  Filled 2018-12-20: qty 50

## 2018-12-20 MED ORDER — MAGNESIUM SULFATE 2 GM/50ML IV SOLN
2.0000 g | Freq: Once | INTRAVENOUS | Status: AC
  Administered 2018-12-20: 2 g via INTRAVENOUS
  Filled 2018-12-20: qty 50

## 2018-12-20 NOTE — Progress Notes (Signed)
Subjective: Still with diarrhea.  Objective: Vital signs in last 24 hours: Temp:  [98.4 F (36.9 C)-99.1 F (37.3 C)] 99.1 F (37.3 C) (03/23 0606) Pulse Rate:  [79-131] 97 (03/23 0606) Resp:  [16-22] 16 (03/23 0606) BP: (97-126)/(58-62) 126/58 (03/23 0606) SpO2:  [94 %-98 %] 96 % (03/23 0606) Last BM Date: 12/17/18  Intake/Output from previous day: 03/22 0701 - 03/23 0700 In: 925 [I.V.:625; IV Piggyback:300] Out: 200 [Urine:200] Intake/Output this shift: Total I/O In: -  Out: 200 [Urine:200]  General appearance: alert and no distress GI: soft, non-tender; bowel sounds normal; no masses,  no organomegaly  Lab Results: Recent Labs    12/18/18 0438 12/19/18 0444 12/20/18 0553  WBC 10.0 16.6* 10.0  HGB 11.1* 12.0* 9.1*  HCT 33.5* 35.3* 26.5*  PLT 177 180 142*   BMET Recent Labs    12/18/18 0438 12/19/18 0444 12/20/18 0553  NA 138 134* 138  K 3.7 4.0 3.1*  CL 104 103 109  CO2 20* 21* 20*  GLUCOSE 143* 228* 141*  BUN <5* 7* 15  CREATININE 1.10 1.32* 1.71*  CALCIUM 9.3 8.9 8.4*   LFT Recent Labs    12/19/18 0444 12/20/18 0553  PROT 6.2* 5.1*  ALBUMIN 2.2* 1.9*  AST 87* 67*  ALT 111* 56*  ALKPHOS 231* 166*  BILITOT 6.6* 4.5*  BILIDIR 3.8*  --    PT/INR No results for input(s): LABPROT, INR in the last 72 hours. Hepatitis Panel No results for input(s): HEPBSAG, HCVAB, HEPAIGM, HEPBIGM in the last 72 hours. C-Diff No results for input(s): CDIFFTOX in the last 72 hours. Fecal Lactopherrin No results for input(s): FECLLACTOFRN in the last 72 hours.  Studies/Results: Dg Abd 1 View  Result Date: 12/18/2018 CLINICAL DATA:  Abdominal distension EXAM: ABDOMEN - 1 VIEW COMPARISON:  12/17/2018 FINDINGS: Scattered large and small bowel gas is seen. No obstructive changes are identified. No free air is seen. No abnormal mass is noted. Degenerative changes of lumbar spine are noted. IMPRESSION: No acute abnormality seen Electronically Signed   By: Inez Catalina  M.D.   On: 12/18/2018 16:21   Mr 3d Recon At Scanner  Result Date: 12/19/2018 CLINICAL DATA:  Worsening total bilirubin with elevated lipase. EXAM: MRI ABDOMEN WITHOUT AND WITH CONTRAST (INCLUDING MRCP) TECHNIQUE: Multiplanar multisequence MR imaging of the abdomen was performed both before and after the administration of intravenous contrast. Heavily T2-weighted images of the biliary and pancreatic ducts were obtained, and three-dimensional MRCP images were rendered by post processing. CONTRAST:  8 cc Gadavist. COMPARISON:  10/13/2018.  Abdomen pelvis CT 10/12/2018. FINDINGS: Lower chest: Basilar atelectasis with tiny left effusion. Hepatobiliary: Motion degraded study with heterogeneous enhancement of liver parenchyma. No definite enhancing liver mass. Gallbladder surgically absent. Extrahepatic common duct measures 9 mm diameter. Common bile duct in the head of the pancreas is 7 mm diameter. 4 x 4 x 7 mm stone is identified in the distal common bile duct (axial haste image 29/series 4, axial T2 image 30/series 6, coronal MRCP image 32/series 10). Pancreas: No focal mass lesion. No dilatation of the main duct. No intraparenchymal cyst. No peripancreatic edema. Spleen:  No splenomegaly. No focal mass lesion. Adrenals/Urinary Tract: No adrenal nodule or mass. Kidneys unremarkable Stomach/Bowel: Small hiatal hernia. Stomach otherwise unremarkable. Duodenum is normally positioned as is the ligament of Treitz. No small bowel or colonic dilatation within the visualized abdomen. Vascular/Lymphatic: No abdominal aortic aneurysm. No abdominal lymphadenopathy. Other:  No intraperitoneal free fluid. Musculoskeletal: No abnormal marrow enhancement within  the visualized bony anatomy. Bilateral gynecomastia evident. IMPRESSION: 1. 4 x 4 x 7 mm stone in the distal common bile duct with mild extrahepatic biliary duct distension. 2. No associated pancreatic ductal dilatation. 3. Small hiatal hernia. Electronically Signed   By:  Misty Stanley M.D.   On: 12/19/2018 16:49   Mr Abdomen Mrcp Moise Boring Contast  Result Date: 12/19/2018 CLINICAL DATA:  Worsening total bilirubin with elevated lipase. EXAM: MRI ABDOMEN WITHOUT AND WITH CONTRAST (INCLUDING MRCP) TECHNIQUE: Multiplanar multisequence MR imaging of the abdomen was performed both before and after the administration of intravenous contrast. Heavily T2-weighted images of the biliary and pancreatic ducts were obtained, and three-dimensional MRCP images were rendered by post processing. CONTRAST:  8 cc Gadavist. COMPARISON:  10/13/2018.  Abdomen pelvis CT 10/12/2018. FINDINGS: Lower chest: Basilar atelectasis with tiny left effusion. Hepatobiliary: Motion degraded study with heterogeneous enhancement of liver parenchyma. No definite enhancing liver mass. Gallbladder surgically absent. Extrahepatic common duct measures 9 mm diameter. Common bile duct in the head of the pancreas is 7 mm diameter. 4 x 4 x 7 mm stone is identified in the distal common bile duct (axial haste image 29/series 4, axial T2 image 30/series 6, coronal MRCP image 32/series 10). Pancreas: No focal mass lesion. No dilatation of the main duct. No intraparenchymal cyst. No peripancreatic edema. Spleen:  No splenomegaly. No focal mass lesion. Adrenals/Urinary Tract: No adrenal nodule or mass. Kidneys unremarkable Stomach/Bowel: Small hiatal hernia. Stomach otherwise unremarkable. Duodenum is normally positioned as is the ligament of Treitz. No small bowel or colonic dilatation within the visualized abdomen. Vascular/Lymphatic: No abdominal aortic aneurysm. No abdominal lymphadenopathy. Other:  No intraperitoneal free fluid. Musculoskeletal: No abnormal marrow enhancement within the visualized bony anatomy. Bilateral gynecomastia evident. IMPRESSION: 1. 4 x 4 x 7 mm stone in the distal common bile duct with mild extrahepatic biliary duct distension. 2. No associated pancreatic ductal dilatation. 3. Small hiatal hernia.  Electronically Signed   By: Misty Stanley M.D.   On: 12/19/2018 16:49    Medications:  Scheduled: . aspirin EC  81 mg Oral Daily  . cholecalciferol  1,000 Units Oral Daily  . cilostazol  100 mg Oral BID  . DULoxetine  40 mg Oral BID  . heparin  5,000 Units Subcutaneous Q8H  . insulin aspart  0-9 Units Subcutaneous TID WC  . insulin glargine  10 Units Subcutaneous BID  . isosorbide mononitrate  60 mg Oral Daily  . lubiprostone  24 mcg Oral Q breakfast  . pantoprazole  40 mg Oral Daily  . pregabalin  50 mg Oral TID  . tamsulosin  0.4 mg Oral Daily  . vitamin B-12  1,000 mcg Oral Daily   Continuous: . sodium chloride 125 mL/hr at 12/20/18 0111  . ampicillin-sulbactam (UNASYN) IV 3 g (12/20/18 1129)  . potassium chloride 10 mEq (12/20/18 1234)    Assessment/Plan: 1) Choledocholithiasis. 2) Diarrhea.   The patient is on schedule for an ERCP tomorrow at 8 AM.  He was not able to undergo the procedure today as there was no availability.  Currently he is stable.  The patient continues to suffer with diarrhea.  It was reported that he was C. Diff negative, but I am unable to locate the results.  Plan: 1) ERCP with stone extraction tomorrow. 2) Recollect stool for C. Diff.  LOS: 6 days   Byard Carranza D 12/20/2018, 12:33 PM

## 2018-12-20 NOTE — Progress Notes (Signed)
RN attempted to collect stool sample, but hat not in place  when pt had BM today. NT notes pt has loose stool everytime he eats. Pt is NPO for procedure tomorrow.   RN will report off that stool sample needed from pt to night shift RN

## 2018-12-20 NOTE — H&P (View-Only) (Signed)
Subjective: Still with diarrhea.  Objective: Vital signs in last 24 hours: Temp:  [98.4 F (36.9 C)-99.1 F (37.3 C)] 99.1 F (37.3 C) (03/23 0606) Pulse Rate:  [79-131] 97 (03/23 0606) Resp:  [16-22] 16 (03/23 0606) BP: (97-126)/(58-62) 126/58 (03/23 0606) SpO2:  [94 %-98 %] 96 % (03/23 0606) Last BM Date: 12/17/18  Intake/Output from previous day: 03/22 0701 - 03/23 0700 In: 925 [I.V.:625; IV Piggyback:300] Out: 200 [Urine:200] Intake/Output this shift: Total I/O In: -  Out: 200 [Urine:200]  General appearance: alert and no distress GI: soft, non-tender; bowel sounds normal; no masses,  no organomegaly  Lab Results: Recent Labs    12/18/18 0438 12/19/18 0444 12/20/18 0553  WBC 10.0 16.6* 10.0  HGB 11.1* 12.0* 9.1*  HCT 33.5* 35.3* 26.5*  PLT 177 180 142*   BMET Recent Labs    12/18/18 0438 12/19/18 0444 12/20/18 0553  NA 138 134* 138  K 3.7 4.0 3.1*  CL 104 103 109  CO2 20* 21* 20*  GLUCOSE 143* 228* 141*  BUN <5* 7* 15  CREATININE 1.10 1.32* 1.71*  CALCIUM 9.3 8.9 8.4*   LFT Recent Labs    12/19/18 0444 12/20/18 0553  PROT 6.2* 5.1*  ALBUMIN 2.2* 1.9*  AST 87* 67*  ALT 111* 56*  ALKPHOS 231* 166*  BILITOT 6.6* 4.5*  BILIDIR 3.8*  --    PT/INR No results for input(s): LABPROT, INR in the last 72 hours. Hepatitis Panel No results for input(s): HEPBSAG, HCVAB, HEPAIGM, HEPBIGM in the last 72 hours. C-Diff No results for input(s): CDIFFTOX in the last 72 hours. Fecal Lactopherrin No results for input(s): FECLLACTOFRN in the last 72 hours.  Studies/Results: Dg Abd 1 View  Result Date: 12/18/2018 CLINICAL DATA:  Abdominal distension EXAM: ABDOMEN - 1 VIEW COMPARISON:  12/17/2018 FINDINGS: Scattered large and small bowel gas is seen. No obstructive changes are identified. No free air is seen. No abnormal mass is noted. Degenerative changes of lumbar spine are noted. IMPRESSION: No acute abnormality seen Electronically Signed   By: Inez Catalina  M.D.   On: 12/18/2018 16:21   Mr 3d Recon At Scanner  Result Date: 12/19/2018 CLINICAL DATA:  Worsening total bilirubin with elevated lipase. EXAM: MRI ABDOMEN WITHOUT AND WITH CONTRAST (INCLUDING MRCP) TECHNIQUE: Multiplanar multisequence MR imaging of the abdomen was performed both before and after the administration of intravenous contrast. Heavily T2-weighted images of the biliary and pancreatic ducts were obtained, and three-dimensional MRCP images were rendered by post processing. CONTRAST:  8 cc Gadavist. COMPARISON:  10/13/2018.  Abdomen pelvis CT 10/12/2018. FINDINGS: Lower chest: Basilar atelectasis with tiny left effusion. Hepatobiliary: Motion degraded study with heterogeneous enhancement of liver parenchyma. No definite enhancing liver mass. Gallbladder surgically absent. Extrahepatic common duct measures 9 mm diameter. Common bile duct in the head of the pancreas is 7 mm diameter. 4 x 4 x 7 mm stone is identified in the distal common bile duct (axial haste image 29/series 4, axial T2 image 30/series 6, coronal MRCP image 32/series 10). Pancreas: No focal mass lesion. No dilatation of the main duct. No intraparenchymal cyst. No peripancreatic edema. Spleen:  No splenomegaly. No focal mass lesion. Adrenals/Urinary Tract: No adrenal nodule or mass. Kidneys unremarkable Stomach/Bowel: Small hiatal hernia. Stomach otherwise unremarkable. Duodenum is normally positioned as is the ligament of Treitz. No small bowel or colonic dilatation within the visualized abdomen. Vascular/Lymphatic: No abdominal aortic aneurysm. No abdominal lymphadenopathy. Other:  No intraperitoneal free fluid. Musculoskeletal: No abnormal marrow enhancement within  the visualized bony anatomy. Bilateral gynecomastia evident. IMPRESSION: 1. 4 x 4 x 7 mm stone in the distal common bile duct with mild extrahepatic biliary duct distension. 2. No associated pancreatic ductal dilatation. 3. Small hiatal hernia. Electronically Signed   By:  Misty Stanley M.D.   On: 12/19/2018 16:49   Mr Abdomen Mrcp Moise Boring Contast  Result Date: 12/19/2018 CLINICAL DATA:  Worsening total bilirubin with elevated lipase. EXAM: MRI ABDOMEN WITHOUT AND WITH CONTRAST (INCLUDING MRCP) TECHNIQUE: Multiplanar multisequence MR imaging of the abdomen was performed both before and after the administration of intravenous contrast. Heavily T2-weighted images of the biliary and pancreatic ducts were obtained, and three-dimensional MRCP images were rendered by post processing. CONTRAST:  8 cc Gadavist. COMPARISON:  10/13/2018.  Abdomen pelvis CT 10/12/2018. FINDINGS: Lower chest: Basilar atelectasis with tiny left effusion. Hepatobiliary: Motion degraded study with heterogeneous enhancement of liver parenchyma. No definite enhancing liver mass. Gallbladder surgically absent. Extrahepatic common duct measures 9 mm diameter. Common bile duct in the head of the pancreas is 7 mm diameter. 4 x 4 x 7 mm stone is identified in the distal common bile duct (axial haste image 29/series 4, axial T2 image 30/series 6, coronal MRCP image 32/series 10). Pancreas: No focal mass lesion. No dilatation of the main duct. No intraparenchymal cyst. No peripancreatic edema. Spleen:  No splenomegaly. No focal mass lesion. Adrenals/Urinary Tract: No adrenal nodule or mass. Kidneys unremarkable Stomach/Bowel: Small hiatal hernia. Stomach otherwise unremarkable. Duodenum is normally positioned as is the ligament of Treitz. No small bowel or colonic dilatation within the visualized abdomen. Vascular/Lymphatic: No abdominal aortic aneurysm. No abdominal lymphadenopathy. Other:  No intraperitoneal free fluid. Musculoskeletal: No abnormal marrow enhancement within the visualized bony anatomy. Bilateral gynecomastia evident. IMPRESSION: 1. 4 x 4 x 7 mm stone in the distal common bile duct with mild extrahepatic biliary duct distension. 2. No associated pancreatic ductal dilatation. 3. Small hiatal hernia.  Electronically Signed   By: Misty Stanley M.D.   On: 12/19/2018 16:49    Medications:  Scheduled: . aspirin EC  81 mg Oral Daily  . cholecalciferol  1,000 Units Oral Daily  . cilostazol  100 mg Oral BID  . DULoxetine  40 mg Oral BID  . heparin  5,000 Units Subcutaneous Q8H  . insulin aspart  0-9 Units Subcutaneous TID WC  . insulin glargine  10 Units Subcutaneous BID  . isosorbide mononitrate  60 mg Oral Daily  . lubiprostone  24 mcg Oral Q breakfast  . pantoprazole  40 mg Oral Daily  . pregabalin  50 mg Oral TID  . tamsulosin  0.4 mg Oral Daily  . vitamin B-12  1,000 mcg Oral Daily   Continuous: . sodium chloride 125 mL/hr at 12/20/18 0111  . ampicillin-sulbactam (UNASYN) IV 3 g (12/20/18 1129)  . potassium chloride 10 mEq (12/20/18 1234)    Assessment/Plan: 1) Choledocholithiasis. 2) Diarrhea.   The patient is on schedule for an ERCP tomorrow at 8 AM.  He was not able to undergo the procedure today as there was no availability.  Currently he is stable.  The patient continues to suffer with diarrhea.  It was reported that he was C. Diff negative, but I am unable to locate the results.  Plan: 1) ERCP with stone extraction tomorrow. 2) Recollect stool for C. Diff.  LOS: 6 days   Sharone Almond D 12/20/2018, 12:33 PM

## 2018-12-20 NOTE — Progress Notes (Signed)
Occupational Therapy Treatment Patient Details Name: Justin [name] MRN: [id number] DOB: 07-07-1933 Today's Date: 12/20/2018    History of present illness Pt is a 84 y/o with history of CAD status post CABG, hyperlipidemia, GERD, essential hypertension came to the hospital with complaints of upper abdominal pain, nausea and vomiting. Noted recent admission with similar complaints and underwent extensive workup.  S/p upper endoscopic ultrasound 3/20. Workup continued.    OT comments  Pt making progress toward goals. States "I'm weak because I haven't eaten anything in 4 days". Decreased safety/judgement noted. Recommend pt have initial 24/7 S after DC to reduce risk of falls. Pt states his daughter/grand daughter plan to be with him. Will continue to follow acutely  To facilitate safe DC home.   Follow Up Recommendations  No OT follow up;Supervision/Assistance - 24 hour(initially)    Equipment Recommendations  None recommended by OT    Recommendations for Other Services      Precautions / Restrictions Precautions Precautions: Fall       Mobility Bed Mobility Overal bed mobility: Modified Independent                Transfers Overall transfer level: Needs assistance   Transfers: Sit to/from Stand Sit to Stand: Min guard         General transfer comment: LOB x 1 requiring min A to regain    Balance Overall balance assessment: Mild deficits observed, not formally tested                                         ADL either performed or assessed with clinical judgement   ADL Overall ADL's : Needs assistance/impaired     Grooming: Supervision/safety;Standing                   Toilet Transfer: Min guard;Ambulation   Toileting- Clothing Manipulation and Hygiene: Supervision/safety;Sit to/from stand       Functional mobility during ADLs: Min guard       Vision       Perception     Praxis      Cognition Arousal/Alertness:  Awake/alert Behavior During Therapy: WFL for tasks assessed/performed Overall Cognitive Status: Impaired/Different from baseline Area of Impairment: Safety/judgement                         Safety/Judgement: Decreased awareness of safety     General Comments: Pt asked to pull call bell once finished using the bathroom. Pt got up and began walking out of bathroom pushing his IV pole        Exercises     Shoulder Instructions       General Comments      Pertinent Vitals/ Pain       Pain Assessment: No/denies pain  Home Living                                          Prior Functioning/Environment              Frequency  Min 2X/week        Progress Toward Goals  OT Goals(current goals can now be found in the care plan section)  Progress towards OT goals: Progressing toward goals  Acute Rehab OT Goals Patient Stated Goal: to  get home and feel better OT Goal Formulation: With patient Time For Goal Achievement: 01/02/19 Potential to Achieve Goals: Good ADL Goals Pt Will Perform Grooming: with modified independence;standing Pt Will Perform Lower Body Dressing: with modified independence;sit to/from stand Pt Will Transfer to Toilet: with modified independence;ambulating;regular height toilet Pt Will Perform Tub/Shower Transfer: Tub transfer;ambulating;with modified independence Pt/caregiver will Perform Home Exercise Program: Both right and left upper extremity;With theraband;With written HEP provided  Plan Discharge plan remains appropriate    Co-evaluation                 AM-PAC OT "6 Clicks" Daily Activity     Outcome Measure   Help from another person eating meals?: None Help from another person taking care of personal grooming?: None Help from another person toileting, which includes using toliet, bedpan, or urinal?: A Little Help from another person bathing (including washing, rinsing, drying)?: A Little Help from  another person to put on and taking off regular upper body clothing?: A Little Help from another person to put on and taking off regular lower body clothing?: A Little 6 Click Score: 20    End of Session Equipment Utilized During Treatment: Gait belt  OT Visit Diagnosis: Muscle weakness (generalized) (M62.81)   Activity Tolerance Patient tolerated treatment well   Patient Left Other (comment)(on toilet)   Nurse Communication Other (comment)(in bathroom)        Time: 1720-1735 OT Time Calculation (min): 15 min  Charges: OT General Charges $OT Visit: 1 Visit OT Treatments $Self Care/Home Management : 8-22 mins  Maurie Boettcher, OT/L   Acute OT Clinical Specialist [place] Pager [phone] Office [phone]    [facility] 12/20/2018, 5:51 PM

## 2018-12-20 NOTE — Progress Notes (Addendum)
PROGRESS NOTE    Justin [name]  ZOX:[id number] DOB: 09-07-1933 DOA: 12/14/2018 PCP: [name], MD   Brief Narrative:  85 year old with history of CAD status post CABG, hyperlipidemia, GERD, essential hypertension came to the hospital with complaints of upper abdominal pain, nausea and vomiting.  He was admitted for similar reason about 2 months ago and underwent extensive work-up including MRCP, ultrasound which showed dilated CBD.  Acute hepatitis panel was negative at that time.  Gallbladder stone was removed.  Upon admission he was noted to have significantly elevated LFTs with elevated WBC.  He was started on Zosyn and admitted to the hospital.  IgG levels negative, ANA levels pending.  Blood cultures ended up growing gram-negative rods, ended up being E. coli.  It is pansensitive therefore will change IV Zosyn to cefazolin but later swtiched to unasyn. . EUS- neg. Due to diarrhea- C diff ordered, results pending. LFTs and lipase went up and MRCP was positive for stone.    Assessment & Plan:   Principal Problem:   Sepsis ([place]) Active Problems:   Hypertension   GERD (gastroesophageal reflux disease)   S/P CABG (coronary artery bypass graft)   CAD (coronary artery disease)   Acute renal failure superimposed on stage 3 chronic kidney disease (HCC)   UTI (urinary tract infection)   HLD (hyperlipidemia)   Abnormal LFTs  Acute hepatitis with elevated LFTs, unknown etiology Acute pancreatitis Elevated total bilirubin with Obstruction. Choledocholithiasis - LFTs improving.  S/p EUS- dilation in the common bile duct which measured up to 6 mm. No stone or sludging seen at that time. -MRCP positive for CBD stone. Plans for ERCP per GI today. Patient is NPO, will result diet after the procedure. Adv as tolerated.  -IgG level-negative, ANA= negative -Right upper quadrant-negative for any acute abnormality besides history of cholecystectomy. -Antiemetics PRN -PPI, placed Liver Bx on Hold  due to current deferral for non emergent procedures. Would request GI to communicate with IR if still needed.   Hypokalemia/Hypomagnesemia.  -repelete K and mag aggressively.   Gram-negative rod bacteremia, E. coli/Enterobacter -Pansensitive on the data. IV ancef switched to Unasyn due to RUQ findings.   History of coronary artery disease status post CABG -Currently chest pain-free.  Continue aspirin and Imdur.  Statin on hold due to abnormal LFTs  Essential hypertension -IV hydralazine PRN.  Antihypertensives on hold due to initial presentation with low blood pressure and concerns for sepsis.  Acute kidney injury on CKD stage III -Stable for now. Baseline around 1.5  Avoid nephrotoxic drugs, gentle hydration.  Diabetes mellitus type 2, insulin-dependent -On Lantus 10 units twice daily, insulin sliding scale and Accu-Chek.  Peripheral neuropathy secondary to diabetes mellitus type 2 -On Lyrica 50 mg 3 times daily  BPH -Continue Flomax  Unsteadiness - PT/OT- no follow up needed.   DVT prophylaxis: Subcutaneous heparin Code Status: Full code Family Communication: None at bedside Disposition Plan: Plans for ERCP per GI. Awaiting their input.   Consultants:   Gastroenterology  Procedures:   None  Antimicrobials:   Zosyn day 3 stopped 3/20  Ancef day 3 stopped 3/22  Unasyn D2   Subjective: Feels better, only mild abd discomfort. Afebrile.   Review of Systems Otherwise negative except as per HPI, including: General = no fevers, chills, dizziness, malaise, fatigue HEENT/EYES = negative for pain, redness, loss of vision, double vision, blurred vision, loss of hearing, sore throat, hoarseness, dysphagia Cardiovascular= negative for chest pain, palpitation, murmurs, lower extremity swelling Respiratory/lungs= negative for shortness  of breath, cough, hemoptysis, wheezing, mucus production Gastrointestinal= negative for nausea, vomiting, melena, hematemesis  Genitourinary= negative for Dysuria, Hematuria, Change in Urinary Frequency MSK = Negative for arthralgia, myalgias, Back Pain, Joint swelling  Neurology= Negative for headache, seizures, numbness, tingling  Psychiatry= Negative for anxiety, depression, suicidal and homocidal ideation Allergy/Immunology= Medication/Food allergy as listed  Skin= Negative for Rash, lesions, ulcers, itching   Objective: Vitals:   12/19/18 1438 12/19/18 2203 12/20/18 0500 12/20/18 0606  BP:  124/60  (!) 126/58  Pulse: 79 (!) 110 88 97  Resp:  16  16  Temp:  98.4 F (36.9 C)  99.1 F (37.3 C)  TempSrc:  Oral  Oral  SpO2:  94%  96%  Weight:      Height:        Intake/Output Summary (Last 24 hours) at 12/20/2018 1020 Last data filed at 12/20/2018 0935 Gross per 24 hour  Intake 925 ml  Output 400 ml  Net 525 ml   Filed Weights   12/14/18 2110 12/15/18 0117 12/17/18 1319  Weight: 77.1 kg 80.5 kg 80.5 kg    Examination: Constitutional: NAD, calm, comfortable Eyes: PERRL, lids and conjunctivae normal ENMT: Mucous membranes are DRY Posterior pharynx clear of any exudate or lesions.Normal dentition.  Neck: normal, supple, no masses, no thyromegaly Respiratory: clear to auscultation bilaterally, no wheezing, no crackles. Normal respiratory effort. No accessory muscle use.  Cardiovascular: Regular rate and rhythm, no murmurs / rubs / gallops. No extremity edema. 2+ pedal pulses. No carotid bruits.  Abdomen: no tenderness, no masses palpated. No hepatosplenomegaly. Bowel sounds positive.  Musculoskeletal: no clubbing / cyanosis. No joint deformity upper and lower extremities. Good ROM, no contractures. Normal muscle tone.  Skin: no rashes, lesions, ulcers. No induration Neurologic: CN 2-12 grossly intact. Sensation intact, DTR normal. Strength 5/5 in all 4.  Psychiatric: Normal judgment and insight. Alert and oriented x 3. Normal mood.   Data Reviewed:   CBC: Recent Labs  Lab 12/14/18 2114   12/16/18 0341 12/17/18 0353 12/18/18 0438 12/19/18 0444 12/20/18 0553  WBC 17.2*   < > 10.3 8.3 10.0 16.6* 10.0  NEUTROABS 15.5*  --   --   --   --   --   --   HGB 13.6   < > 9.9* 10.2* 11.1* 12.0* 9.1*  HCT 41.0   < > 30.6* 30.8* 33.5* 35.3* 26.5*  MCV 85.1   < > 85.0 85.3 84.4 82.1 82.8  PLT 239   < > 175 182 177 180 142*   < > = values in this interval not displayed.   Basic Metabolic Panel: Recent Labs  Lab 12/16/18 0341 12/17/18 0353 12/18/18 0438 12/19/18 0444 12/20/18 0553  NA 139 140 138 134* 138  K 3.2* 3.6 3.7 4.0 3.1*  CL 112* 111 104 103 109  CO2 21* 21* 20* 21* 20*  GLUCOSE 69* 129* 143* 228* 141*  BUN 17 9 <5* 7* 15  CREATININE 1.62* 1.32* 1.10 1.32* 1.71*  CALCIUM 8.4* 8.8* 9.3 8.9 8.4*  MG 1.7 1.7 1.4* 1.6* 1.7   GFR: Estimated Creatinine Clearance: 33.3 mL/min (A) (by C-G formula based on SCr of 1.71 mg/dL (H)). Liver Function Tests: Recent Labs  Lab 12/16/18 0341 12/17/18 0353 12/18/18 0438 12/19/18 0444 12/20/18 0553  AST 240* 102* 93* 87* 67*  ALT 344* 241* 178* 111* 56*  ALKPHOS 215* 199* 232* 231* 166*  BILITOT 1.7* 0.8 2.7* 6.6* 4.5*  PROT 5.4* 5.8* 6.3* 6.2* 5.1*  ALBUMIN  2.2* 2.3* 2.5* 2.2* 1.9*   Recent Labs  Lab 12/14/18 2228 12/19/18 0444  LIPASE 35 1,959*   No results for input(s): AMMONIA in the last 168 hours. Coagulation Profile: Recent Labs  Lab 12/14/18 2228  INR 1.3*   Cardiac Enzymes: Recent Labs  Lab 12/17/18 1813 12/17/18 2347 12/18/18 0438  TROPONINI <0.03 <0.03 <0.03   BNP (last 3 results) No results for input(s): PROBNP in the last 8760 hours. HbA1C: No results for input(s): HGBA1C in the last 72 hours. CBG: Recent Labs  Lab 12/19/18 0807 12/19/18 1201 12/19/18 1723 12/19/18 2204 12/20/18 0825  GLUCAP 199* 183* 118* 136* 99   Lipid Profile: No results for input(s): CHOL, HDL, LDLCALC, TRIG, CHOLHDL, LDLDIRECT in the last 72 hours. Thyroid Function Tests: No results for input(s): TSH,  T4TOTAL, FREET4, T3FREE, THYROIDAB in the last 72 hours. Anemia Panel: No results for input(s): VITAMINB12, FOLATE, FERRITIN, TIBC, IRON, RETICCTPCT in the last 72 hours. Sepsis Labs: Recent Labs  Lab 12/14/18 2114 12/14/18 2309 12/15/18 0208 12/17/18 0353  PROCALCITON  --   --  20.13 5.68  LATICACIDVEN 2.9* 3.2* 1.6  --     Recent Results (from the past 240 hour(s))  Blood Culture (routine x 2)     Status: Abnormal   Collection Time: 12/14/18  9:00 PM  Result Value Ref Range Status   Specimen Description BLOOD RIGHT ARM  Final   Special Requests   Final    BOTTLES DRAWN AEROBIC AND ANAEROBIC Blood Culture results may not be optimal due to an excessive volume of blood received in culture bottles   Culture  Setup Time   Final    GRAM NEGATIVE RODS AEROBIC BOTTLE ONLY CRITICAL VALUE NOTED.  VALUE IS CONSISTENT WITH PREVIOUSLY REPORTED AND CALLED VALUE.    Culture (A)  Final    ESCHERICHIA COLI SUSCEPTIBILITIES PERFORMED ON PREVIOUS CULTURE WITHIN THE LAST 5 DAYS. Performed at [facility], [place] [address] [place], [place] [address]    Report Status 12/17/2018 FINAL  Final  Blood Culture (routine x 2)     Status: Abnormal   Collection Time: 12/14/18  9:15 PM  Result Value Ref Range Status   Specimen Description BLOOD RIGHT HAND  Final   Special Requests   Final    BOTTLES DRAWN AEROBIC ONLY Blood Culture results may not be optimal due to an excessive volume of blood received in culture bottles   Culture  Setup Time   Final    GRAM NEGATIVE RODS AEROBIC BOTTLE ONLY CRITICAL RESULT CALLED TO, READ BACK BY AND VERIFIED WITH: Karlene Einstein PharmD 15:05 12/15/18 ([email or web address]) Performed at [facility], [place] [address] [place], [place] [address]    Culture ESCHERICHIA COLI (A)  Final   Report Status 12/17/2018 FINAL  Final   Organism ID, Bacteria ESCHERICHIA COLI  Final      Susceptibility   Escherichia coli - MIC*    AMPICILLIN 8 SENSITIVE Sensitive     CEFAZOLIN <=4  SENSITIVE Sensitive     CEFEPIME <=1 SENSITIVE Sensitive     CEFTAZIDIME <=1 SENSITIVE Sensitive     CEFTRIAXONE <=1 SENSITIVE Sensitive     CIPROFLOXACIN <=0.25 SENSITIVE Sensitive     GENTAMICIN <=1 SENSITIVE Sensitive     IMIPENEM <=0.25 SENSITIVE Sensitive     TRIMETH/SULFA <=20 SENSITIVE Sensitive     AMPICILLIN/SULBACTAM <=2 SENSITIVE Sensitive     PIP/TAZO <=4 SENSITIVE Sensitive     Extended ESBL NEGATIVE Sensitive     *  ESCHERICHIA COLI  Blood Culture ID Panel (Reflexed)     Status: Abnormal   Collection Time: 12/14/18  9:15 PM  Result Value Ref Range Status   Enterococcus species NOT DETECTED NOT DETECTED Final   Listeria monocytogenes NOT DETECTED NOT DETECTED Final   Staphylococcus species NOT DETECTED NOT DETECTED Final   Staphylococcus aureus (BCID) NOT DETECTED NOT DETECTED Final   Streptococcus species NOT DETECTED NOT DETECTED Final   Streptococcus agalactiae NOT DETECTED NOT DETECTED Final   Streptococcus pneumoniae NOT DETECTED NOT DETECTED Final   Streptococcus pyogenes NOT DETECTED NOT DETECTED Final   Acinetobacter baumannii NOT DETECTED NOT DETECTED Final   Enterobacteriaceae species DETECTED (A) NOT DETECTED Final    Comment: Enterobacteriaceae represent a large family of gram-negative bacteria, not a single organism. CRITICAL RESULT CALLED TO, READ BACK BY AND VERIFIED WITH: Karlene Einstein PharmD 15:05 12/15/18 ([email or web address])    Enterobacter cloacae complex NOT DETECTED NOT DETECTED Final   Escherichia coli DETECTED (A) NOT DETECTED Final    Comment: CRITICAL RESULT CALLED TO, READ BACK BY AND VERIFIED WITH: Karlene Einstein PharmD 15:05 12/15/18 ([email or web address])    Klebsiella oxytoca NOT DETECTED NOT DETECTED Final   Klebsiella pneumoniae NOT DETECTED NOT DETECTED Final   Proteus species NOT DETECTED NOT DETECTED Final   Serratia marcescens NOT DETECTED NOT DETECTED Final   Carbapenem resistance NOT DETECTED NOT DETECTED Final   Haemophilus influenzae NOT DETECTED NOT  DETECTED Final   Neisseria meningitidis NOT DETECTED NOT DETECTED Final   Pseudomonas aeruginosa NOT DETECTED NOT DETECTED Final   Candida albicans NOT DETECTED NOT DETECTED Final   Candida glabrata NOT DETECTED NOT DETECTED Final   Candida krusei NOT DETECTED NOT DETECTED Final   Candida parapsilosis NOT DETECTED NOT DETECTED Final   Candida tropicalis NOT DETECTED NOT DETECTED Final    Comment: Performed at [facility], [place] [address] [place], [place] [address]  Urine culture     Status: None   Collection Time: 12/17/18  2:08 AM  Result Value Ref Range Status   Specimen Description URINE, CLEAN CATCH  Final   Special Requests NONE  Final   Culture   Final    NO GROWTH Performed at [facility], [place] [address], [place], [place] [address]    Report Status 12/18/2018 FINAL  Final         Radiology Studies: Dg Abd 1 View  Result Date: 12/18/2018 CLINICAL DATA:  Abdominal distension EXAM: ABDOMEN - 1 VIEW COMPARISON:  12/17/2018 FINDINGS: Scattered large and small bowel gas is seen. No obstructive changes are identified. No free air is seen. No abnormal mass is noted. Degenerative changes of lumbar spine are noted. IMPRESSION: No acute abnormality seen Electronically Signed   By: Inez Catalina M.D.   On: 12/18/2018 16:21   Mr 3d Recon At Scanner  Result Date: 12/19/2018 CLINICAL DATA:  Worsening total bilirubin with elevated lipase. EXAM: MRI ABDOMEN WITHOUT AND WITH CONTRAST (INCLUDING MRCP) TECHNIQUE: Multiplanar multisequence MR imaging of the abdomen was performed both before and after the administration of intravenous contrast. Heavily T2-weighted images of the biliary and pancreatic ducts were obtained, and three-dimensional MRCP images were rendered by post processing. CONTRAST:  8 cc Gadavist. COMPARISON:  10/13/2018.  Abdomen pelvis CT 10/12/2018. FINDINGS: Lower chest: Basilar atelectasis with tiny left effusion. Hepatobiliary: Motion degraded study with  heterogeneous enhancement of liver parenchyma. No definite enhancing liver mass. Gallbladder surgically absent. Extrahepatic common duct measures 9 mm diameter. Common bile duct in  the head of the pancreas is 7 mm diameter. 4 x 4 x 7 mm stone is identified in the distal common bile duct (axial haste image 29/series 4, axial T2 image 30/series 6, coronal MRCP image 32/series 10). Pancreas: No focal mass lesion. No dilatation of the main duct. No intraparenchymal cyst. No peripancreatic edema. Spleen:  No splenomegaly. No focal mass lesion. Adrenals/Urinary Tract: No adrenal nodule or mass. Kidneys unremarkable Stomach/Bowel: Small hiatal hernia. Stomach otherwise unremarkable. Duodenum is normally positioned as is the ligament of Treitz. No small bowel or colonic dilatation within the visualized abdomen. Vascular/Lymphatic: No abdominal aortic aneurysm. No abdominal lymphadenopathy. Other:  No intraperitoneal free fluid. Musculoskeletal: No abnormal marrow enhancement within the visualized bony anatomy. Bilateral gynecomastia evident. IMPRESSION: 1. 4 x 4 x 7 mm stone in the distal common bile duct with mild extrahepatic biliary duct distension. 2. No associated pancreatic ductal dilatation. 3. Small hiatal hernia. Electronically Signed   By: Misty Stanley M.D.   On: 12/19/2018 16:49   Mr Abdomen Mrcp Moise Boring Contast  Result Date: 12/19/2018 CLINICAL DATA:  Worsening total bilirubin with elevated lipase. EXAM: MRI ABDOMEN WITHOUT AND WITH CONTRAST (INCLUDING MRCP) TECHNIQUE: Multiplanar multisequence MR imaging of the abdomen was performed both before and after the administration of intravenous contrast. Heavily T2-weighted images of the biliary and pancreatic ducts were obtained, and three-dimensional MRCP images were rendered by post processing. CONTRAST:  8 cc Gadavist. COMPARISON:  10/13/2018.  Abdomen pelvis CT 10/12/2018. FINDINGS: Lower chest: Basilar atelectasis with tiny left effusion. Hepatobiliary: Motion  degraded study with heterogeneous enhancement of liver parenchyma. No definite enhancing liver mass. Gallbladder surgically absent. Extrahepatic common duct measures 9 mm diameter. Common bile duct in the head of the pancreas is 7 mm diameter. 4 x 4 x 7 mm stone is identified in the distal common bile duct (axial haste image 29/series 4, axial T2 image 30/series 6, coronal MRCP image 32/series 10). Pancreas: No focal mass lesion. No dilatation of the main duct. No intraparenchymal cyst. No peripancreatic edema. Spleen:  No splenomegaly. No focal mass lesion. Adrenals/Urinary Tract: No adrenal nodule or mass. Kidneys unremarkable Stomach/Bowel: Small hiatal hernia. Stomach otherwise unremarkable. Duodenum is normally positioned as is the ligament of Treitz. No small bowel or colonic dilatation within the visualized abdomen. Vascular/Lymphatic: No abdominal aortic aneurysm. No abdominal lymphadenopathy. Other:  No intraperitoneal free fluid. Musculoskeletal: No abnormal marrow enhancement within the visualized bony anatomy. Bilateral gynecomastia evident. IMPRESSION: 1. 4 x 4 x 7 mm stone in the distal common bile duct with mild extrahepatic biliary duct distension. 2. No associated pancreatic ductal dilatation. 3. Small hiatal hernia. Electronically Signed   By: Misty Stanley M.D.   On: 12/19/2018 16:49        Scheduled Meds: . aspirin EC  81 mg Oral Daily  . cholecalciferol  1,000 Units Oral Daily  . cilostazol  100 mg Oral BID  . DULoxetine  40 mg Oral BID  . heparin  5,000 Units Subcutaneous Q8H  . insulin aspart  0-9 Units Subcutaneous TID WC  . insulin glargine  10 Units Subcutaneous BID  . isosorbide mononitrate  60 mg Oral Daily  . lubiprostone  24 mcg Oral Q breakfast  . pantoprazole  40 mg Oral Daily  . pregabalin  50 mg Oral TID  . tamsulosin  0.4 mg Oral Daily  . vitamin B-12  1,000 mcg Oral Daily   Continuous Infusions: . sodium chloride 125 mL/hr at 12/20/18 0111  .  ampicillin-sulbactam (  UNASYN) IV 3 g (12/20/18 0519)  . magnesium sulfate 1 - 4 g bolus IVPB 2 g (12/20/18 1015)  . potassium chloride       LOS: 6 days   Time spent= 30 mins    Kenden Brandt Arsenio Loader, MD [facility]  If 7PM-7AM, please contact night-coverage [email or web address] 12/20/2018, 10:20 AM

## 2018-12-21 ENCOUNTER — Encounter (HOSPITAL_COMMUNITY): Payer: Self-pay | Admitting: *Deleted

## 2018-12-21 ENCOUNTER — Inpatient Hospital Stay (HOSPITAL_COMMUNITY): Payer: Medicare PPO

## 2018-12-21 ENCOUNTER — Inpatient Hospital Stay (HOSPITAL_COMMUNITY): Payer: Medicare PPO | Admitting: Anesthesiology

## 2018-12-21 ENCOUNTER — Encounter (HOSPITAL_COMMUNITY)
Admission: EM | Disposition: A | Discharge status: Home / Self Care | Payer: Self-pay | Source: Home / Self Care | Attending: Internal Medicine

## 2018-12-21 HISTORY — PX: SPHINCTEROTOMY: SHX5544

## 2018-12-21 HISTORY — PX: REMOVAL OF STONES: SHX5545

## 2018-12-21 HISTORY — PX: ERCP: SHX5425

## 2018-12-21 LAB — CBC
HCT: 27.7 % — ABNORMAL LOW (ref 39.0–52.0)
Hemoglobin: 8.9 g/dL — ABNORMAL LOW (ref 13.0–17.0)
MCH: 27.1 pg (ref 26.0–34.0)
MCHC: 32.1 g/dL (ref 30.0–36.0)
MCV: 84.5 fL (ref 80.0–100.0)
Platelets: 141 10*3/uL — ABNORMAL LOW (ref 150–400)
RBC: 3.28 MIL/uL — ABNORMAL LOW (ref 4.22–5.81)
RDW: 15.6 % — ABNORMAL HIGH (ref 11.5–15.5)
WBC: 8.7 10*3/uL (ref 4.0–10.5)
nRBC: 0 % (ref 0.0–0.2)

## 2018-12-21 LAB — COMPREHENSIVE METABOLIC PANEL
ALT: 49 U/L — ABNORMAL HIGH (ref 0–44)
AST: 56 U/L — ABNORMAL HIGH (ref 15–41)
Albumin: 1.9 g/dL — ABNORMAL LOW (ref 3.5–5.0)
Alkaline Phosphatase: 176 U/L — ABNORMAL HIGH (ref 38–126)
Anion gap: 9 (ref 5–15)
BUN: 9 mg/dL (ref 8–23)
CO2: 21 mmol/L — ABNORMAL LOW (ref 22–32)
Calcium: 8.4 mg/dL — ABNORMAL LOW (ref 8.9–10.3)
Chloride: 109 mmol/L (ref 98–111)
Creatinine, Ser: 1.23 mg/dL (ref 0.61–1.24)
GFR calc Af Amer: >60 mL/min (ref >60)
GFR calc non Af Amer: 54 mL/min — ABNORMAL LOW (ref >60)
Glucose, Bld: 76 mg/dL (ref 70–99)
Potassium: 3.5 mmol/L (ref 3.5–5.1)
Sodium: 139 mmol/L (ref 135–145)
Total Bilirubin: 2.4 mg/dL — ABNORMAL HIGH (ref 0.3–1.2)
Total Protein: 5.3 g/dL — ABNORMAL LOW (ref 6.5–8.1)

## 2018-12-21 LAB — GLUCOSE, CAPILLARY
Glucose-Capillary: 66 mg/dL — ABNORMAL LOW (ref 70–99)
Glucose-Capillary: 86 mg/dL (ref 70–99)
Glucose-Capillary: 99 mg/dL (ref 70–99)
Glucose-Capillary: 116 mg/dL — ABNORMAL HIGH (ref 70–99)
Glucose-Capillary: 173 mg/dL — ABNORMAL HIGH (ref 70–99)
Glucose-Capillary: 253 mg/dL — ABNORMAL HIGH (ref 70–99)

## 2018-12-21 LAB — MAGNESIUM: Magnesium: 1.8 mg/dL (ref 1.7–2.4)

## 2018-12-21 SURGERY — ERCP, WITH INTERVENTION IF INDICATED
Anesthesia: General

## 2018-12-21 MED ORDER — ONDANSETRON HCL 4 MG/2ML IJ SOLN
INTRAMUSCULAR | Status: DC | PRN
  Administered 2018-12-21: 4 mg via INTRAVENOUS

## 2018-12-21 MED ORDER — DEXAMETHASONE SODIUM PHOSPHATE 10 MG/ML IJ SOLN
INTRAMUSCULAR | Status: DC | PRN
  Administered 2018-12-21: 10 mg via INTRAVENOUS

## 2018-12-21 MED ORDER — PHENYLEPHRINE 40 MCG/ML (10ML) SYRINGE FOR IV PUSH (FOR BLOOD PRESSURE SUPPORT)
PREFILLED_SYRINGE | INTRAVENOUS | Status: DC | PRN
  Administered 2018-12-21: 80 ug via INTRAVENOUS
  Administered 2018-12-21: 120 ug via INTRAVENOUS
  Administered 2018-12-21 (×2): 80 ug via INTRAVENOUS
  Administered 2018-12-21: 120 ug via INTRAVENOUS

## 2018-12-21 MED ORDER — SUCCINYLCHOLINE CHLORIDE 20 MG/ML IJ SOLN: INTRAMUSCULAR | Status: DC | PRN

## 2018-12-21 MED ORDER — PROPOFOL 10 MG/ML IV BOLUS
INTRAVENOUS | Status: DC | PRN
  Administered 2018-12-21: 20 mg via INTRAVENOUS
  Administered 2018-12-21: 50 mg via INTRAVENOUS

## 2018-12-21 MED ORDER — INDOMETHACIN 50 MG RE SUPP
RECTAL | Status: AC
  Filled 2018-12-21: qty 2

## 2018-12-21 MED ORDER — LIDOCAINE 2% (20 MG/ML) 5 ML SYRINGE
INTRAMUSCULAR | Status: DC | PRN
  Administered 2018-12-21: 60 mg via INTRAVENOUS

## 2018-12-21 MED ORDER — INSULIN GLARGINE 100 UNIT/ML ~~LOC~~ SOLN
8.0000 [IU] | Freq: Two times a day (BID) | SUBCUTANEOUS | Status: DC
  Administered 2018-12-21 – 2018-12-23 (×4): 8 [IU] via SUBCUTANEOUS
  Filled 2018-12-21 (×4): qty 0.08

## 2018-12-21 MED ORDER — SODIUM CHLORIDE 0.9 % IV SOLN: INTRAVENOUS | Status: DC

## 2018-12-21 MED ORDER — SUCCINYLCHOLINE CHLORIDE 200 MG/10ML IV SOSY
PREFILLED_SYRINGE | INTRAVENOUS | Status: DC | PRN
  Administered 2018-12-21: 100 mg via INTRAVENOUS

## 2018-12-21 MED ORDER — FENTANYL CITRATE (PF) 250 MCG/5ML IJ SOLN
INTRAMUSCULAR | Status: DC | PRN
  Administered 2018-12-21: 100 ug via INTRAVENOUS

## 2018-12-21 MED ORDER — INDOMETHACIN 50 MG RE SUPP
RECTAL | Status: DC | PRN
  Administered 2018-12-21: 100 mg via RECTAL

## 2018-12-21 MED ORDER — GLUCAGON HCL RDNA (DIAGNOSTIC) 1 MG IJ SOLR
INTRAMUSCULAR | Status: AC
  Filled 2018-12-21: qty 1

## 2018-12-21 MED ORDER — MENTHOL 3 MG MT LOZG: 1.0000 | LOZENGE | OROMUCOSAL | Status: DC | PRN

## 2018-12-21 MED ORDER — LACTATED RINGERS IV SOLN
INTRAVENOUS | Status: DC | PRN
  Administered 2018-12-21: 08:00:00 via INTRAVENOUS

## 2018-12-21 MED ORDER — SODIUM CHLORIDE 0.9 % IV SOLN
INTRAVENOUS | Status: DC | PRN
  Administered 2018-12-21: 20 ug/min via INTRAVENOUS

## 2018-12-21 MED ORDER — DEXTROSE 50 % IV SOLN
25.0000 mL | Freq: Once | INTRAVENOUS | Status: AC
  Administered 2018-12-21: 25 mL via INTRAVENOUS

## 2018-12-21 NOTE — Consult Note (Signed)
   [facility] CM Inpatient Consult   12/21/2018  Arvle Grabe [birth year] [id number]   Patient screened for potential [facility] Care Management services with his [facility] PPO plan. Patient has medium risk at 17% for unplanned readmissions. Chart reviewed and noted no current disposition plan yet for now. PT/ OT evaluated patient without  therapy recommendations at this time.  Had previous [facility] service with pharmacy for medication adherence.   Patient presented to the hospital with nausea, vomiting and abdominal pain, increased urinary frequency.  Per history and physical on 12/14/18, Mr. Kamdin Follett is an 85 y.o. male with medical history significant of hypertension, hyperlipidemia, diabetes mellitus, GERD, depression, anxiety, CAD, CABG, diverticulitis, prostate cancer, UTI, CKD stage III.   His primary care provider is Dr. Iona Beard Osei-Bonsu with [facility]. Patient is from home living with his daughter/ granddaughter and independent with most ADLs at baseline.   There are no identifiable [facility] Care Management needs at this point. If patient's post hospital needs change, please place a [facility] Care Management consult for community follow-up as appropriate.     For questions or referral, please contact:  Saulo Anthis A. Maanav Kassabian, BSN, RN-BC [facility] Liaison Cell: [phone]

## 2018-12-21 NOTE — Progress Notes (Signed)
PROGRESS NOTE    Justin [name]  NOM:[id number] DOB: 06-14-1933 DOA: 12/14/2018 PCP: [name], MD   Brief Narrative:  85 year old with history of CAD status post CABG, hyperlipidemia, GERD, essential hypertension came to the hospital with complaints of upper abdominal pain, nausea and vomiting.  He was admitted for similar reason about 2 months ago and underwent extensive work-up including MRCP, ultrasound which showed dilated CBD.  Acute hepatitis panel was negative at that time.  Gallbladder stone was removed.  Upon admission he was noted to have significantly elevated LFTs with elevated WBC.  He was started on Zosyn and admitted to the hospital.  IgG levels negative, ANA levels pending.  Blood cultures ended up growing gram-negative rods, ended up being E. coli.  It is pansensitive therefore will change IV Zosyn to cefazolin but later swtiched to unasyn. . EUS- neg. Due to diarrhea- C diff ordered, results pending. LFTs and lipase went up and MRCP was positive for stone. ERCP planned for 3/24   Assessment & Plan:   Principal Problem:   Sepsis ([place]) Active Problems:   Hypertension   GERD (gastroesophageal reflux disease)   S/P CABG (coronary artery bypass graft)   CAD (coronary artery disease)   Acute renal failure superimposed on stage 3 chronic kidney disease (HCC)   UTI (urinary tract infection)   HLD (hyperlipidemia)   Abnormal LFTs   Abdominal distention  Acute hepatitis with elevated LFTs, unknown etiology Acute pancreatitis Elevated total bilirubin with Obstruction. Choledocholithiasis - LFTs improving.  S/p EUS- dilation in the common bile duct which measured up to 6 mm. No stone or sludging seen at that time. -MRCP positive for CBD stone. ERCP today. Diet as tolerated after the procedure.  -IgG level-negative, ANA= negative -Right upper quadrant-negative for any acute abnormality besides history of cholecystectomy. -Antiemetics PRN -PPI, placed Liver Bx on Hold due  to current deferral for non emergent procedures. Would request GI to communicate with IR if still needed.   Hypokalemia/Hypomagnesemia.  -repelete K and mag aggressively.   Gram-negative rod bacteremia, E. coli/Enterobacter -Pansensitive on the data. IV ancef switched to Unasyn due to RUQ findings.   History of coronary artery disease status post CABG -No chest pain.  Continue aspirin and Imdur.  Statin on hold due to abnormal LFTs  Essential hypertension -IV hydralazine PRN.  Antihypertensives on hold due to initial presentation with low blood pressure and concerns for sepsis.  Acute kidney injury on CKD stage III -Stable. Baseline around 1.5  Avoid nephrotoxic drugs, gentle hydration.  Diabetes mellitus type 2, insulin-dependent; with hypoglycemia -Lantus reduced to 8U BID, insulin sliding scale and Accu-Chek.  Peripheral neuropathy secondary to diabetes mellitus type 2 -On Lyrica 50 mg 3 times daily  BPH -Continue Flomax  Unsteadiness - PT/OT- no follow up needed.   DVT prophylaxis: Change to SCDs due to risk of bleeding. Code Status: Full code Family Communication: None at bedside Disposition Plan: ERCP today  Consultants:   Gastroenterology  Procedures:   ERCP today  Antimicrobials:   Zosyn day 3 stopped 3/20  Ancef day 3 stopped 3/22  Unasyn D3   Subjective: Hypogylcemic episode this morning while NPO. Improved with 1amp.  Drowsy after returned to the room from anesthesia.   Review of Systems Otherwise negative except as per HPI, including: General = no fevers, chills, dizziness, malaise, fatigue HEENT/EYES = negative for pain, redness, loss of vision, double vision, blurred vision, loss of hearing, sore throat, hoarseness, dysphagia Cardiovascular= negative for chest pain, palpitation, murmurs,  lower extremity swelling Respiratory/lungs= negative for shortness of breath, cough, hemoptysis, wheezing, mucus production Gastrointestinal= negative for  nausea, vomiting,, abdominal pain, melena, hematemesis Genitourinary= negative for Dysuria, Hematuria, Change in Urinary Frequency MSK = Negative for arthralgia, myalgias, Back Pain, Joint swelling  Neurology= Negative for headache, seizures, numbness, tingling  Psychiatry= Negative for anxiety, depression, suicidal and homocidal ideation Allergy/Immunology= Medication/Food allergy as listed  Skin= Negative for Rash, lesions, ulcers, itching   Objective: Vitals:   12/21/18 0554 12/21/18 0742 12/21/18 1053 12/21/18 1100  BP: (!) 137/57 (!) 181/65 (!) 207/79 (!) 172/57  Pulse: 80 80 69 63  Resp: 18 20 17 16   Temp: 98.2 F (36.8 C) 98.5 F (36.9 C) 97.8 F (36.6 C)   TempSrc: Oral Oral Oral   SpO2: 96% 98% 95% 94%  Weight:      Height:        Intake/Output Summary (Last 24 hours) at 12/21/2018 1108 Last data filed at 12/21/2018 1037 Gross per 24 hour  Intake 1541.41 ml  Output 400 ml  Net 1141.41 ml   Filed Weights   12/14/18 2110 12/15/18 0117 12/17/18 1319  Weight: 77.1 kg 80.5 kg 80.5 kg    Examination: Constitutional: NAD, calm, comfortable Eyes: PERRL, lids and conjunctivae normal ENMT: Mucous membranes are moist. Posterior pharynx clear of any exudate or lesions.Normal dentition.  Neck: normal, supple, no masses, no thyromegaly Respiratory: clear to auscultation bilaterally, no wheezing, no crackles. Normal respiratory effort. No accessory muscle use.  Cardiovascular: Regular rate and rhythm, no murmurs / rubs / gallops. No extremity edema. 2+ pedal pulses. No carotid bruits.  Abdomen: no tenderness, no masses palpated. No hepatosplenomegaly. Bowel sounds positive.  Musculoskeletal: no clubbing / cyanosis. No joint deformity upper and lower extremities. Good ROM, no contractures. Normal muscle tone.  Skin: no rashes, lesions, ulcers. No induration Neurologic: CN 2-12 grossly intact. Sensation intact, DTR normal. Strength 5/5 in all 4.  Psychiatric: Normal judgment and  insight. Alert and oriented x 3. Normal mood.   Data Reviewed:   CBC: Recent Labs  Lab 12/14/18 2114  12/17/18 0353 12/18/18 0438 12/19/18 0444 12/20/18 0553 12/21/18 0416  WBC 17.2*   < > 8.3 10.0 16.6* 10.0 8.7  NEUTROABS 15.5*  --   --   --   --   --   --   HGB 13.6   < > 10.2* 11.1* 12.0* 9.1* 8.9*  HCT 41.0   < > 30.8* 33.5* 35.3* 26.5* 27.7*  MCV 85.1   < > 85.3 84.4 82.1 82.8 84.5  PLT 239   < > 182 177 180 142* 141*   < > = values in this interval not displayed.   Basic Metabolic Panel: Recent Labs  Lab 12/17/18 0353 12/18/18 0438 12/19/18 0444 12/20/18 0553 12/21/18 0416  NA 140 138 134* 138 139  K 3.6 3.7 4.0 3.1* 3.5  CL 111 104 103 109 109  CO2 21* 20* 21* 20* 21*  GLUCOSE 129* 143* 228* 141* 76  BUN 9 <5* 7* 15 9  CREATININE 1.32* 1.10 1.32* 1.71* 1.23  CALCIUM 8.8* 9.3 8.9 8.4* 8.4*  MG 1.7 1.4* 1.6* 1.7 1.8   GFR: Estimated Creatinine Clearance: 46.3 mL/min (by C-G formula based on SCr of 1.23 mg/dL). Liver Function Tests: Recent Labs  Lab 12/17/18 0353 12/18/18 0438 12/19/18 0444 12/20/18 0553 12/21/18 0416  AST 102* 93* 87* 67* 56*  ALT 241* 178* 111* 56* 49*  ALKPHOS 199* 232* 231* 166* 176*  BILITOT 0.8 2.7*  6.6* 4.5* 2.4*  PROT 5.8* 6.3* 6.2* 5.1* 5.3*  ALBUMIN 2.3* 2.5* 2.2* 1.9* 1.9*   Recent Labs  Lab 12/14/18 2228 12/19/18 0444  LIPASE 35 1,959*   No results for input(s): AMMONIA in the last 168 hours. Coagulation Profile: Recent Labs  Lab 12/14/18 2228  INR 1.3*   Cardiac Enzymes: Recent Labs  Lab 12/17/18 1813 12/17/18 2347 12/18/18 0438  TROPONINI <0.03 <0.03 <0.03   BNP (last 3 results) No results for input(s): PROBNP in the last 8760 hours. HbA1C: No results for input(s): HGBA1C in the last 72 hours. CBG: Recent Labs  Lab 12/20/18 1710 12/20/18 2137 12/20/18 2309 12/21/18 0727 12/21/18 0749  GLUCAP 123* 63* 146* 66* 86   Lipid Profile: No results for input(s): CHOL, HDL, LDLCALC, TRIG, CHOLHDL,  LDLDIRECT in the last 72 hours. Thyroid Function Tests: No results for input(s): TSH, T4TOTAL, FREET4, T3FREE, THYROIDAB in the last 72 hours. Anemia Panel: No results for input(s): VITAMINB12, FOLATE, FERRITIN, TIBC, IRON, RETICCTPCT in the last 72 hours. Sepsis Labs: Recent Labs  Lab 12/14/18 2114 12/14/18 2309 12/15/18 0208 12/17/18 0353  PROCALCITON  --   --  20.13 5.68  LATICACIDVEN 2.9* 3.2* 1.6  --     Recent Results (from the past 240 hour(s))  Blood Culture (routine x 2)     Status: Abnormal   Collection Time: 12/14/18  9:00 PM  Result Value Ref Range Status   Specimen Description BLOOD RIGHT ARM  Final   Special Requests   Final    BOTTLES DRAWN AEROBIC AND ANAEROBIC Blood Culture results may not be optimal due to an excessive volume of blood received in culture bottles   Culture  Setup Time   Final    GRAM NEGATIVE RODS AEROBIC BOTTLE ONLY CRITICAL VALUE NOTED.  VALUE IS CONSISTENT WITH PREVIOUSLY REPORTED AND CALLED VALUE.    Culture (A)  Final    ESCHERICHIA COLI SUSCEPTIBILITIES PERFORMED ON PREVIOUS CULTURE WITHIN THE LAST 5 DAYS. Performed at [facility], [place] [address], [place], [place] [address]    Report Status 12/17/2018 FINAL  Final  Blood Culture (routine x 2)     Status: Abnormal   Collection Time: 12/14/18  9:15 PM  Result Value Ref Range Status   Specimen Description BLOOD RIGHT HAND  Final   Special Requests   Final    BOTTLES DRAWN AEROBIC ONLY Blood Culture results may not be optimal due to an excessive volume of blood received in culture bottles   Culture  Setup Time   Final    GRAM NEGATIVE RODS AEROBIC BOTTLE ONLY CRITICAL RESULT CALLED TO, READ BACK BY AND VERIFIED WITH: Karlene Einstein PharmD 15:05 12/15/18 ([email or web address]) Performed at [facility], [place] [address], [place], [place] [address]    Culture ESCHERICHIA COLI (A)  Final   Report Status 12/17/2018 FINAL  Final   Organism ID, Bacteria ESCHERICHIA COLI  Final       Susceptibility   Escherichia coli - MIC*    AMPICILLIN 8 SENSITIVE Sensitive     CEFAZOLIN <=4 SENSITIVE Sensitive     CEFEPIME <=1 SENSITIVE Sensitive     CEFTAZIDIME <=1 SENSITIVE Sensitive     CEFTRIAXONE <=1 SENSITIVE Sensitive     CIPROFLOXACIN <=0.25 SENSITIVE Sensitive     GENTAMICIN <=1 SENSITIVE Sensitive     IMIPENEM <=0.25 SENSITIVE Sensitive     TRIMETH/SULFA <=20 SENSITIVE Sensitive     AMPICILLIN/SULBACTAM <=2 SENSITIVE Sensitive     PIP/TAZO <=4 SENSITIVE  Sensitive     Extended ESBL NEGATIVE Sensitive     * ESCHERICHIA COLI  Blood Culture ID Panel (Reflexed)     Status: Abnormal   Collection Time: 12/14/18  9:15 PM  Result Value Ref Range Status   Enterococcus species NOT DETECTED NOT DETECTED Final   Listeria monocytogenes NOT DETECTED NOT DETECTED Final   Staphylococcus species NOT DETECTED NOT DETECTED Final   Staphylococcus aureus (BCID) NOT DETECTED NOT DETECTED Final   Streptococcus species NOT DETECTED NOT DETECTED Final   Streptococcus agalactiae NOT DETECTED NOT DETECTED Final   Streptococcus pneumoniae NOT DETECTED NOT DETECTED Final   Streptococcus pyogenes NOT DETECTED NOT DETECTED Final   Acinetobacter baumannii NOT DETECTED NOT DETECTED Final   Enterobacteriaceae species DETECTED (A) NOT DETECTED Final    Comment: Enterobacteriaceae represent a large family of gram-negative bacteria, not a single organism. CRITICAL RESULT CALLED TO, READ BACK BY AND VERIFIED WITH: Karlene Einstein PharmD 15:05 12/15/18 ([email or web address])    Enterobacter cloacae complex NOT DETECTED NOT DETECTED Final   Escherichia coli DETECTED (A) NOT DETECTED Final    Comment: CRITICAL RESULT CALLED TO, READ BACK BY AND VERIFIED WITH: Karlene Einstein PharmD 15:05 12/15/18 ([email or web address])    Klebsiella oxytoca NOT DETECTED NOT DETECTED Final   Klebsiella pneumoniae NOT DETECTED NOT DETECTED Final   Proteus species NOT DETECTED NOT DETECTED Final   Serratia marcescens NOT DETECTED NOT DETECTED Final    Carbapenem resistance NOT DETECTED NOT DETECTED Final   Haemophilus influenzae NOT DETECTED NOT DETECTED Final   Neisseria meningitidis NOT DETECTED NOT DETECTED Final   Pseudomonas aeruginosa NOT DETECTED NOT DETECTED Final   Candida albicans NOT DETECTED NOT DETECTED Final   Candida glabrata NOT DETECTED NOT DETECTED Final   Candida krusei NOT DETECTED NOT DETECTED Final   Candida parapsilosis NOT DETECTED NOT DETECTED Final   Candida tropicalis NOT DETECTED NOT DETECTED Final    Comment: Performed at [facility], [place] [address], [place], [place] [address]  Urine culture     Status: None   Collection Time: 12/17/18  2:08 AM  Result Value Ref Range Status   Specimen Description URINE, CLEAN CATCH  Final   Special Requests NONE  Final   Culture   Final    NO GROWTH Performed at [facility], [place] [address], [place], [place] [address]    Report Status 12/18/2018 FINAL  Final         Radiology Studies: Mr 3d Recon At Scanner  Result Date: 12/19/2018 CLINICAL DATA:  Worsening total bilirubin with elevated lipase. EXAM: MRI ABDOMEN WITHOUT AND WITH CONTRAST (INCLUDING MRCP) TECHNIQUE: Multiplanar multisequence MR imaging of the abdomen was performed both before and after the administration of intravenous contrast. Heavily T2-weighted images of the biliary and pancreatic ducts were obtained, and three-dimensional MRCP images were rendered by post processing. CONTRAST:  8 cc Gadavist. COMPARISON:  10/13/2018.  Abdomen pelvis CT 10/12/2018. FINDINGS: Lower chest: Basilar atelectasis with tiny left effusion. Hepatobiliary: Motion degraded study with heterogeneous enhancement of liver parenchyma. No definite enhancing liver mass. Gallbladder surgically absent. Extrahepatic common duct measures 9 mm diameter. Common bile duct in the head of the pancreas is 7 mm diameter. 4 x 4 x 7 mm stone is identified in the distal common bile duct (axial haste image 29/series 4, axial T2 image  30/series 6, coronal MRCP image 32/series 10). Pancreas: No focal mass lesion. No dilatation of the main duct. No intraparenchymal cyst. No peripancreatic edema. Spleen:  No  splenomegaly. No focal mass lesion. Adrenals/Urinary Tract: No adrenal nodule or mass. Kidneys unremarkable Stomach/Bowel: Small hiatal hernia. Stomach otherwise unremarkable. Duodenum is normally positioned as is the ligament of Treitz. No small bowel or colonic dilatation within the visualized abdomen. Vascular/Lymphatic: No abdominal aortic aneurysm. No abdominal lymphadenopathy. Other:  No intraperitoneal free fluid. Musculoskeletal: No abnormal marrow enhancement within the visualized bony anatomy. Bilateral gynecomastia evident. IMPRESSION: 1. 4 x 4 x 7 mm stone in the distal common bile duct with mild extrahepatic biliary duct distension. 2. No associated pancreatic ductal dilatation. 3. Small hiatal hernia. Electronically Signed   By: Misty Stanley M.D.   On: 12/19/2018 16:49   Mr Abdomen Mrcp Moise Boring Contast  Result Date: 12/19/2018 CLINICAL DATA:  Worsening total bilirubin with elevated lipase. EXAM: MRI ABDOMEN WITHOUT AND WITH CONTRAST (INCLUDING MRCP) TECHNIQUE: Multiplanar multisequence MR imaging of the abdomen was performed both before and after the administration of intravenous contrast. Heavily T2-weighted images of the biliary and pancreatic ducts were obtained, and three-dimensional MRCP images were rendered by post processing. CONTRAST:  8 cc Gadavist. COMPARISON:  10/13/2018.  Abdomen pelvis CT 10/12/2018. FINDINGS: Lower chest: Basilar atelectasis with tiny left effusion. Hepatobiliary: Motion degraded study with heterogeneous enhancement of liver parenchyma. No definite enhancing liver mass. Gallbladder surgically absent. Extrahepatic common duct measures 9 mm diameter. Common bile duct in the head of the pancreas is 7 mm diameter. 4 x 4 x 7 mm stone is identified in the distal common bile duct (axial haste image  29/series 4, axial T2 image 30/series 6, coronal MRCP image 32/series 10). Pancreas: No focal mass lesion. No dilatation of the main duct. No intraparenchymal cyst. No peripancreatic edema. Spleen:  No splenomegaly. No focal mass lesion. Adrenals/Urinary Tract: No adrenal nodule or mass. Kidneys unremarkable Stomach/Bowel: Small hiatal hernia. Stomach otherwise unremarkable. Duodenum is normally positioned as is the ligament of Treitz. No small bowel or colonic dilatation within the visualized abdomen. Vascular/Lymphatic: No abdominal aortic aneurysm. No abdominal lymphadenopathy. Other:  No intraperitoneal free fluid. Musculoskeletal: No abnormal marrow enhancement within the visualized bony anatomy. Bilateral gynecomastia evident. IMPRESSION: 1. 4 x 4 x 7 mm stone in the distal common bile duct with mild extrahepatic biliary duct distension. 2. No associated pancreatic ductal dilatation. 3. Small hiatal hernia. Electronically Signed   By: Misty Stanley M.D.   On: 12/19/2018 16:49        Scheduled Meds: . [MAR Hold] aspirin EC  81 mg Oral Daily  . [MAR Hold] cholecalciferol  1,000 Units Oral Daily  . [MAR Hold] cilostazol  100 mg Oral BID  . [MAR Hold] DULoxetine  40 mg Oral BID  . [MAR Hold] heparin  5,000 Units Subcutaneous Q8H  . [MAR Hold] insulin aspart  0-9 Units Subcutaneous TID WC  . insulin glargine  8 Units Subcutaneous BID  . [MAR Hold] isosorbide mononitrate  60 mg Oral Daily  . [MAR Hold] lubiprostone  24 mcg Oral Q breakfast  . [MAR Hold] pantoprazole  40 mg Oral Daily  . [MAR Hold] pregabalin  50 mg Oral TID  . [MAR Hold] tamsulosin  0.4 mg Oral Daily  . [MAR Hold] vitamin B-12  1,000 mcg Oral Daily   Continuous Infusions: . sodium chloride 125 mL/hr at 12/21/18 0600  . sodium chloride    . [MAR Hold] ampicillin-sulbactam (UNASYN) IV 3 g (12/21/18 0518)     LOS: 7 days   Time spent= 25 mins    Takita Riecke Arsenio Loader, MD [facility]  If 7PM-7AM, please contact  night-coverage [email or web address] 12/21/2018, 11:08 AM

## 2018-12-21 NOTE — Transfer of Care (Signed)
Immediate Anesthesia Transfer of Care Note  Patient: Justin [name]  Procedure(s) Performed: ENDOSCOPIC RETROGRADE CHOLANGIOPANCREATOGRAPHY (ERCP) (N/A ) SPHINCTEROTOMY REMOVAL OF STONES  Patient Location: Endoscopy Unit  Anesthesia Type:General  Level of Consciousness: awake and drowsy  Airway & Oxygen Therapy: Patient Spontanous Breathing  Post-op Assessment: Report given to RN, Post -op Vital signs reviewed and stable and Patient moving all extremities X 4  Post vital signs: Reviewed and stable  Last Vitals:  Vitals Value Taken Time  BP 207/79 12/21/2018 10:54 AM  Temp    Pulse 55 12/21/2018 10:55 AM  Resp 20 12/21/2018 10:55 AM  SpO2 95 % 12/21/2018 10:55 AM  Vitals shown include unvalidated device data.  Last Pain:  Vitals:   12/21/18 1053  TempSrc: Oral  PainSc:       Patients Stated Pain Goal: 2 (50/56/97 9480)  Complications: No apparent anesthesia complications

## 2018-12-21 NOTE — Anesthesia Procedure Notes (Signed)
Procedure Name: Intubation Date/Time: 12/21/2018 9:01 AM Performed by: Harden Mo, CRNA Pre-anesthesia Checklist: Patient identified, Emergency Drugs available, Suction available and Patient being monitored Patient Re-evaluated:Patient Re-evaluated prior to induction Oxygen Delivery Method: Circle System Utilized Preoxygenation: Pre-oxygenation with 100% oxygen Induction Type: IV induction and Rapid sequence Laryngoscope Size: Miller and 2 Grade View: Grade I Tube type: Oral Tube size: 7.5 mm Number of attempts: 1 Airway Equipment and Method: Stylet and Oral airway Placement Confirmation: ETT inserted through vocal cords under direct vision,  positive ETCO2 and breath sounds checked- equal and bilateral Secured at: 23 cm Tube secured with: Tape Dental Injury: Teeth and Oropharynx as per pre-operative assessment

## 2018-12-21 NOTE — Anesthesia Preprocedure Evaluation (Addendum)
Anesthesia Evaluation  Patient identified by MRN, date of birth, ID band Patient awake    Reviewed: Allergy & Precautions, NPO status , Patient's Chart, lab work & pertinent test results, reviewed documented beta blocker date and time   History of Anesthesia Complications Negative for: history of anesthetic complications  Airway Mallampati: II  TM Distance: >3 FB Neck ROM: Full    Dental  (+) Teeth Intact, Dental Advisory Given, Chipped,    Pulmonary shortness of breath, former smoker,    breath sounds clear to auscultation       Cardiovascular hypertension, Pt. on medications and Pt. on home beta blockers + CAD, + CABG and + Peripheral Vascular Disease   Rhythm:Regular     Neuro/Psych Anxiety negative neurological ROS     GI/Hepatic GERD  ,  Endo/Other  diabetes  Renal/GU ARFRenal disease     Musculoskeletal  (+) Arthritis ,   Abdominal   Peds  Hematology  (+) anemia ,   Anesthesia Other Findings Coronary angiogram 11/04/2016: Right coronary artery dominant, small and diffusely diseased. Distal RCA has a 70% stenosis, small vessel, measures approximately 2 mm. Small by mouth branch. SVG to RCA: Appears occluded. Left Main coronary artery: Widely patent. Mild calcification. LAD: Large vessel, calcified, 80-90% stenosis in the midsegment. Faint complaint of feeling evident in the distal LAD. Moderate-sized D1. LIMA to LAD: Widely patent. Circumflex coronary artery: Moderate size vessel. OM1 is large but is occluded in the proximal segment. OM 2 is large but diffusely diseased and small caliber. Mild diffuse luminal irregularities present in the circumflex. This SVG to ramus intermediate and OM1: Widely patent. Ramus intermediate: He is occluded in the proximal segment. Probable SVG to ramus is widely patent.  Reproductive/Obstetrics                           Anesthesia Physical Anesthesia  Plan  ASA: III  Anesthesia Plan: General   Post-op Pain Management:    Induction: Intravenous  PONV Risk Score and Plan: 2 and Ondansetron and Dexamethasone  Airway Management Planned: Oral ETT  Additional Equipment: None  Intra-op Plan:   Post-operative Plan: Extubation in OR  Informed Consent: I have reviewed the patients History and Physical, chart, labs and discussed the procedure including the risks, benefits and alternatives for the proposed anesthesia with the patient or authorized representative who has indicated his/her understanding and acceptance.     Dental advisory given  Plan Discussed with: CRNA and Surgeon  Anesthesia Plan Comments:         Anesthesia Quick Evaluation

## 2018-12-21 NOTE — Progress Notes (Signed)
Patient ready to go to Endo for ERCP. CBG was checked and find to be 66. Patient received Dextrose 50% - 25 ml per protocol order. Patient left to Endo. MD made aware.

## 2018-12-21 NOTE — Progress Notes (Signed)
Patient back from Endo. Alert and oriented x 4; no acute distress noted, no complaints. Will continue to monitor.

## 2018-12-21 NOTE — Interval H&P Note (Signed)
History and Physical Interval Note:  12/21/2018 7:46 AM  Justin [name]  has presented today for surgery, with the diagnosis of Choledocholithiasis.  The various methods of treatment have been discussed with the patient and family. After consideration of risks, benefits and other options for treatment, the patient has consented to  Procedure(s): ENDOSCOPIC RETROGRADE CHOLANGIOPANCREATOGRAPHY (ERCP) (N/A) as a surgical intervention.  The patient's history has been reviewed, patient examined, no change in status, stable for surgery.  I have reviewed the patient's chart and labs.  Questions were answered to the patient's satisfaction.     [name] D

## 2018-12-21 NOTE — Op Note (Signed)
[facility] Patient Name: Justin [name] Procedure Date : 12/21/2018 MRN: [id number] Attending MD: [name] , MD Date of Birth: 21-Feb-1933 CSN: [id number] Age: 85 Admit Type: Inpatient Procedure:                ERCP Indications:              Common bile duct stone(s) Providers:                [name], MD, Baird Cancer, RN, Grace Isaac,                            RN, Elspeth Cho Tech., Technician, Garrison [place],                            CRNA Referring MD:              Medicines:                General Anesthesia Complications:            No immediate complications. Estimated Blood Loss:     Estimated blood loss: none. Procedure:                Pre-Anesthesia Assessment:                           - Prior to the procedure, a History and Physical                            was performed, and patient medications and                            allergies were reviewed. The patient's tolerance of                            previous anesthesia was also reviewed. The risks                            and benefits of the procedure and the sedation                            options and risks were discussed with the patient.                            All questions were answered, and informed consent                            was obtained. Prior Anticoagulants: The patient has                            taken heparin, last dose was 2 days prior to                            procedure. ASA Grade Assessment: II - A patient  with mild systemic disease. After reviewing the                            risks and benefits, the patient was deemed in                            satisfactory condition to undergo the procedure.                           - Sedation was administered by an anesthesia                            professional. General anesthesia was attained.                           After obtaining informed consent, the scope was              passed under direct vision. Throughout the                            procedure, the patient's blood pressure, pulse, and                            oxygen saturations were monitored continuously. The                            [id number]-Q180V ([id number] Olympus duodenoscope was                            introduced through the mouth, and used to inject                            contrast into and used to inject contrast into the                            bile duct. The ERCP was performed with difficulty.                            The patient tolerated the procedure well. Scope In: Scope Out: Findings:      The major papilla was normal. The bile duct was deeply cannulated with       the short-nosed traction sphincterotome. Contrast was injected. I       personally interpreted the bile duct images. There was brisk flow of       contrast through the ducts. Image quality was excellent. Contrast       extended to the hepatic ducts. The lower third of the main bile duct       contained one stone, which was 7 mm in diameter. The middle third of the       main bile duct and upper third of the main bile duct were moderately       dilated, with a stone causing an obstruction. The largest diameter was       10 mm. A cholecystectomy had been performed. A short 0.035 inch Soft       Antonietta Breach was passed  into the biliary tree. A 20 mm biliary sphincterotomy       was made with a traction (standard) sphincterotome using ERBE       electrocautery. There was no post-sphincterotomy bleeding. The biliary       tree was swept with a 12 mm balloon starting at the lower third of the       main duct. One stone was removed. No stones remained.      Locating the ampulla, which took one hour, was extremely difficult as a       result of the patient's anatomy. The decision was made to place him in a       mostly left lateral decubitus position, which then allowed for optimal       visualization of the ampulla  as well as holding position. In fact, one       in place the patient was rotated back to a prone position. Cannulation       of the ampulla was performed with ease, but maneuvering was required to       cannulated the CBD. The ampulla was very large and floppy. The guidewire       was secured in the right intrahepatic ducts. Contrast injection revealed       a very distal CBD stone as noted on the prior MRCP. The mid and proximal       CBD were dilated at 1 cm. In the lower CBD there was a mild nonmalignant       stenosis. A large sphincterotomy was created and this exposed the stone.       The stone was able to be extracted with the sphincterotome in full       traction. Three sweeps of the CBD were performed with the 9-12 mm       balloon. The balloon was deflated to 9 mm at the stenosed portion of the       CBD and then reinflated back up to 12 mm. No further stones were       extracted. The final occlusion cholangiogram was negative for any       retained stones and excellent drainage of the contrast was achieved. Impression:               - The major papilla appeared normal.                           - The upper third of the main bile duct and middle                            third of the main bile duct were moderately                            dilated, with a stone causing an obstruction.                           - The patient has had a cholecystectomy.                           - Choledocholithiasis was found. Complete removal  was accomplished by biliary sphincterotomy and                            balloon extraction.                           - A biliary sphincterotomy was performed.                           - The biliary tree was swept. Recommendation:           - Return patient to hospital ward for ongoing care.                           - Resume regular diet.                           - Avoid anticoagulation as the patient had a very                             large sphincterotomy. Procedure Code(s):        --- Professional ---                           [phone], Endoscopic retrograde                            cholangiopancreatography (ERCP); with removal of                            calculi/debris from biliary/pancreatic duct(s)                           [id number], Endoscopic retrograde                            cholangiopancreatography (ERCP); with                            sphincterotomy/papillotomy                           [phone], Endoscopic catheterization of the biliary                            ductal system, radiological supervision and                            interpretation Diagnosis Code(s):        --- Professional ---                           K80.51, Calculus of bile duct without cholangitis                            or cholecystitis with obstruction                           Z90.49,  Acquired absence of other specified parts                            of digestive tract CPT copyright 2018 [employer]. All rights reserved. The codes documented in this report are preliminary and upon coder review may  be revised to meet current compliance requirements. [name], MD [name], MD 12/21/2018 10:59:16 AM This report has been signed electronically. Number of Addenda: 0

## 2018-12-21 NOTE — Anesthesia Postprocedure Evaluation (Addendum)
Anesthesia Post Note  Patient: Oswin Griffith  Procedure(s) Performed: ENDOSCOPIC RETROGRADE CHOLANGIOPANCREATOGRAPHY (ERCP) (N/A ) SPHINCTEROTOMY REMOVAL OF STONES     Patient location during evaluation: Endoscopy Anesthesia Type: General Level of consciousness: awake and patient cooperative Pain management: pain level controlled Vital Signs Assessment: post-procedure vital signs reviewed and stable Respiratory status: spontaneous breathing, nonlabored ventilation, respiratory function stable and patient connected to nasal cannula oxygen Cardiovascular status: blood pressure returned to baseline and stable Postop Assessment: no apparent nausea or vomiting Anesthetic complications: no    Last Vitals:  Vitals:   12/21/18 1110 12/21/18 1120  BP: (!) 175/73 (!) 185/55  Pulse: 68 61  Resp: 18 16  Temp:    SpO2: 95% 94%    Last Pain:  Vitals:   12/21/18 1120  TempSrc:   PainSc: 0-No pain                 Jordon Kristiansen

## 2018-12-21 NOTE — Progress Notes (Signed)
Inpatient Diabetes Program Recommendations  AACE/ADA: New Consensus Statement on Inpatient Glycemic Control (2015)  Target Ranges:  Prepandial:   less than 140 mg/dL      Peak postprandial:   less than 180 mg/dL (1-2 hours)      Critically ill patients:  140 - 180 mg/dL   Lab Results  Component Value Date   GLUCAP 86 12/21/2018   HGBA1C 8.0 (H) 10/08/2017    Review of Glycemic Control  Diabetes history: DM 2 Outpatient Diabetes medications: Januvia 100 mg Daily, Lantus 20 units BID Current orders for Inpatient glycemic control: Lantus 10 units BID, Novolog 0-9 units tid  Inpatient Diabetes Program Recommendations:   Patient currently in procedure. Hypoglycemia this am in the 60's. Please consider decreasing Lantus to 8 units BID.  Thank you, Nani Gasser. Remi Lopata, RN, MSN, CDE  Diabetes Coordinator Inpatient Glycemic Control Team Team Pager [phone] (8am-5pm) 12/21/2018 11:07 AM

## 2018-12-22 ENCOUNTER — Encounter (HOSPITAL_COMMUNITY): Payer: Self-pay | Admitting: Gastroenterology

## 2018-12-22 LAB — GLUCOSE, CAPILLARY
Glucose-Capillary: 142 mg/dL — ABNORMAL HIGH (ref 70–99)
Glucose-Capillary: 198 mg/dL — ABNORMAL HIGH (ref 70–99)
Glucose-Capillary: 173 mg/dL — ABNORMAL HIGH (ref 70–99)
Glucose-Capillary: 171 mg/dL — ABNORMAL HIGH (ref 70–99)

## 2018-12-22 LAB — COMPREHENSIVE METABOLIC PANEL
ALT: 40 U/L (ref 0–44)
AST: 40 U/L (ref 15–41)
Albumin: 1.8 g/dL — ABNORMAL LOW (ref 3.5–5.0)
Alkaline Phosphatase: 152 U/L — ABNORMAL HIGH (ref 38–126)
Anion gap: 3 — ABNORMAL LOW (ref 5–15)
BUN: 14 mg/dL (ref 8–23)
CO2: 20 mmol/L — ABNORMAL LOW (ref 22–32)
Calcium: 7.6 mg/dL — ABNORMAL LOW (ref 8.9–10.3)
Chloride: 112 mmol/L — ABNORMAL HIGH (ref 98–111)
Creatinine, Ser: 1.47 mg/dL — ABNORMAL HIGH (ref 0.61–1.24)
GFR calc Af Amer: 50 mL/min — ABNORMAL LOW (ref >60)
GFR calc non Af Amer: 43 mL/min — ABNORMAL LOW (ref >60)
Glucose, Bld: 199 mg/dL — ABNORMAL HIGH (ref 70–99)
Potassium: 4.1 mmol/L (ref 3.5–5.1)
Sodium: 135 mmol/L (ref 135–145)
Total Bilirubin: 1.8 mg/dL — ABNORMAL HIGH (ref 0.3–1.2)
Total Protein: 5.1 g/dL — ABNORMAL LOW (ref 6.5–8.1)

## 2018-12-22 LAB — LIPASE, BLOOD: Lipase: 101 U/L — ABNORMAL HIGH (ref 11–51)

## 2018-12-22 LAB — CBC
HCT: 28.8 % — ABNORMAL LOW (ref 39.0–52.0)
Hemoglobin: 9.1 g/dL — ABNORMAL LOW (ref 13.0–17.0)
MCH: 27 pg (ref 26.0–34.0)
MCHC: 31.6 g/dL (ref 30.0–36.0)
MCV: 85.5 fL (ref 80.0–100.0)
Platelets: 149 10*3/uL — ABNORMAL LOW (ref 150–400)
RBC: 3.37 MIL/uL — ABNORMAL LOW (ref 4.22–5.81)
RDW: 15.6 % — ABNORMAL HIGH (ref 11.5–15.5)
WBC: 7.8 10*3/uL (ref 4.0–10.5)
nRBC: 0 % (ref 0.0–0.2)

## 2018-12-22 LAB — MAGNESIUM: Magnesium: 1.7 mg/dL (ref 1.7–2.4)

## 2018-12-22 MED ORDER — LIDOCAINE VISCOUS HCL 2 % MT SOLN: 15.0000 mL | OROMUCOSAL | Status: DC | PRN

## 2018-12-22 MED ORDER — ALUM HYDROXIDE-MAG CARBONATE 95-358 MG/15ML PO SUSP: 15.0000 mL | ORAL | Status: DC | PRN

## 2018-12-22 MED ORDER — SUCRALFATE 1 G PO TABS: 1.0000 g | ORAL_TABLET | Freq: Three times a day (TID) | ORAL | Status: DC | PRN

## 2018-12-22 NOTE — Progress Notes (Signed)
Subjective: The patient reports epigastric and chest tightness.  This started shortly after PO intake.  Objective: Vital signs in last 24 hours: Temp:  [97.8 F (36.6 C)-98.7 F (37.1 C)] 98.3 F (36.8 C) (03/25 0527) Pulse Rate:  [71-78] 75 (03/25 0527) Resp:  [15-18] 16 (03/25 0527) BP: (121-150)/(56-72) 142/58 (03/25 0527) SpO2:  [96 %-97 %] 97 % (03/25 0527) Last BM Date: 12/20/18  Intake/Output from previous day: 03/24 0701 - 03/25 0700 In: 2599.1 [P.O.:240; I.V.:2059.1; IV Piggyback:300] Out: 200 [Urine:200] Intake/Output this shift: Total I/O In: 689.5 [I.V.:589.5; IV Piggyback:100] Out: -   General appearance: alert and no distress GI: some epigastric tenderness  Lab Results: Recent Labs    12/20/18 0553 12/21/18 0416 12/22/18 0358  WBC 10.0 8.7 7.8  HGB 9.1* 8.9* 9.1*  HCT 26.5* 27.7* 28.8*  PLT 142* 141* 149*   BMET Recent Labs    12/20/18 0553 12/21/18 0416 12/22/18 0358  NA 138 139 135  K 3.1* 3.5 4.1  CL 109 109 112*  CO2 20* 21* 20*  GLUCOSE 141* 76 199*  BUN 15 9 14   CREATININE 1.71* 1.23 1.47*  CALCIUM 8.4* 8.4* 7.6*   LFT Recent Labs    12/22/18 0358  PROT 5.1*  ALBUMIN 1.8*  AST 40  ALT 40  ALKPHOS 152*  BILITOT 1.8*   PT/INR No results for input(s): LABPROT, INR in the last 72 hours. Hepatitis Panel No results for input(s): HEPBSAG, HCVAB, HEPAIGM, HEPBIGM in the last 72 hours. C-Diff No results for input(s): CDIFFTOX in the last 72 hours. Fecal Lactopherrin No results for input(s): FECLLACTOFRN in the last 72 hours.  Studies/Results: Dg Ercp Biliary & Pancreatic Ducts  Result Date: 12/21/2018 CLINICAL DATA:  Choledocholithiasis EXAM: ERCP TECHNIQUE: Multiple spot images obtained with the fluoroscopic device and submitted for interpretation post-procedure. FLUOROSCOPY TIME:  41 seconds COMPARISON:  MRCP - 12/19/2018 FINDINGS: Three spot fluoroscopic images of the right upper abdominal quadrant during ERCP are provided for  review Initial image demonstrates an ERCP probe overlying the right upper abdominal quadrant. Surgical clips overlie the right upper abdomen. Median sternotomy wires overlie the lower chest. Subsequent image demonstrates selective cannulation opacification the common bile duct. There is a filling defect within the distal aspect of the CBD which may represent the suspected choledocholithiasis demonstrated on preceding MRCP. Completion image demonstrates further opacification of the common bile duct. IMPRESSION: ERCP with findings suggestive of choledocholithiasis. These images were submitted for radiologic interpretation only. Please see the procedural report for the amount of contrast and the fluoroscopy time utilized. Electronically Signed   By: Sandi Mariscal M.D.   On: 12/21/2018 11:10    Medications:  Scheduled: . aspirin EC  81 mg Oral Daily  . cholecalciferol  1,000 Units Oral Daily  . cilostazol  100 mg Oral BID  . DULoxetine  40 mg Oral BID  . insulin aspart  0-9 Units Subcutaneous TID WC  . insulin glargine  8 Units Subcutaneous BID  . isosorbide mononitrate  60 mg Oral Daily  . lubiprostone  24 mcg Oral Q breakfast  . pantoprazole  40 mg Oral Daily  . pregabalin  50 mg Oral TID  . tamsulosin  0.4 mg Oral Daily  . vitamin B-12  1,000 mcg Oral Daily   Continuous: . sodium chloride 125 mL/hr at 12/22/18 0921  . ampicillin-sulbactam (UNASYN) IV 3 g (12/22/18 1049)    Assessment/Plan: 1) Choledocholithiasis s/p ERCP with stone extraction. 2) Chest tightness.   The patient was feeling  well until after eating this AM.  He started to have some chest tightness and symptoms very similar to the same pains that brought him to the hospital.  There were no issues with PO intake last evening.  The stone was successfully removed and there was no evidence of any retained stones in the CBD.    Plan: 1) Trial of sucralfate. 2) Check EKG to ensure that there is no cardiac issue.  LOS: 8 days    Silvie Obremski D 12/22/2018, 11:55 AM

## 2018-12-22 NOTE — Progress Notes (Signed)
PROGRESS NOTE    Justin [name]  VWP:[id number] DOB: 06-04-1933 DOA: 12/14/2018 PCP: [name], MD   Brief Narrative:  85 year old with history of CAD status post CABG, hyperlipidemia, GERD, essential hypertension came to the hospital with complaints of upper abdominal pain, nausea and vomiting.  He was admitted for similar reason about 2 months ago and underwent extensive work-up including MRCP, ultrasound which showed dilated CBD.  Acute hepatitis panel was negative at that time.  Gallbladder stone was removed.  Upon admission he was noted to have significantly elevated LFTs with elevated WBC.  He was started on Zosyn and admitted to the hospital.  IgG levels negative, ANA levels pending.  Blood cultures ended up growing gram-negative rods, ended up being E. coli.  It is pansensitive therefore will change IV Zosyn to cefazolin but later swtiched to unasyn. . EUS- neg. Due to diarrhea- C diff ordered, results pending. LFTs and lipase went up and MRCP was positive for stone. ERCP showed stone which was removed and biliary sphincterotomy was performed.   Assessment & Plan:   Principal Problem:   Sepsis ([place]) Active Problems:   Hypertension   GERD (gastroesophageal reflux disease)   S/P CABG (coronary artery bypass graft)   CAD (coronary artery disease)   Acute renal failure superimposed on stage 3 chronic kidney disease (HCC)   UTI (urinary tract infection)   HLD (hyperlipidemia)   Abnormal LFTs   Abdominal distention  Acute hepatitis with elevated LFTs, unknown etiology Acute pancreatitis Elevated total bilirubin with Obstruction. Choledocholithiasis - LFTs improving.  S/p EUS- dilation in the common bile duct which measured up to 6 mm. No stone or sludging seen at that time. -MRCP positive for CBD stone.  ERCP performed 3/24 status post stone removal and biliary sphincterotomy. -Check lipase level due to abdominal discomfort -Diet as tolerated. -IgG level-negative, ANA=  negative -Right upper quadrant-negative for any acute abnormality besides history of cholecystectomy. -Antiemetics PRN -PPI, placed Liver Bx on Hold due to current deferral for non emergent procedures. Would request GI to communicate with IR if still needed.   Hypokalemia/Hypomagnesemia.  -repelete K and mag aggressively.   Gram-negative rod bacteremia, E. coli/Enterobacter -Pansensitive on the data. IV ancef switched to Unasyn due to RUQ findings. Cont for now. Will be able to d/c prior to discharge.   History of coronary artery disease status post CABG -No chest pain.  Continue aspirin and Imdur.  Statin on hold due to abnormal LFTs  Essential hypertension -IV hydralazine PRN.  Antihypertensives on hold due to initial presentation with low blood pressure and concerns for sepsis.  Acute kidney injury on CKD stage III -Stable. Baseline around 1.5  Avoid nephrotoxic drugs, gentle hydration.  Diabetes mellitus type 2, insulin-dependent; with hypoglycemia -Lantus reduced to 8U BID, insulin sliding scale and Accu-Chek.  Peripheral neuropathy secondary to diabetes mellitus type 2 -On Lyrica 50 mg 3 times daily  BPH -Continue Flomax  Unsteadiness - PT/OT- no follow up needed.   DVT prophylaxis: Change to SCDs due to risk of bleeding. Code Status: Full code Family Communication: None at bedside Disposition Plan: Maintain hosp stay, watch his diet. If adequate- he can go tomorrow. Meantime needs IV Abx.   Consultants:   Gastroenterology  Procedures:   ERCP 3/24  Antimicrobials:   Zosyn day 3 stopped 3/20  Ancef day 3 stopped 3/22  Unasyn D4   Subjective: Nausea this morning with breakfast and some abd burning sensation. Eotherwise ok  Review of Systems Otherwise negative except as  per HPI, including: General = no fevers, chills, dizziness, malaise, fatigue HEENT/EYES = negative for pain, redness, loss of vision, double vision, blurred vision, loss of hearing, sore  throat, hoarseness, dysphagia Cardiovascular= negative for chest pain, palpitation, murmurs, lower extremity swelling Respiratory/lungs= negative for shortness of breath, cough, hemoptysis, wheezing, mucus production Gastrointestinal= negative for nausea, vomiting. Genitourinary= negative for Dysuria, Hematuria, Change in Urinary Frequency MSK = Negative for arthralgia, myalgias, Back Pain, Joint swelling  Neurology= Negative for headache, seizures, numbness, tingling  Psychiatry= Negative for anxiety, depression, suicidal and homocidal ideation Allergy/Immunology= Medication/Food allergy as listed  Skin= Negative for Rash, lesions, ulcers, itching   Objective: Vitals:   12/21/18 1120 12/21/18 1350 12/21/18 2157 12/22/18 0527  BP: (!) 185/55 (!) 150/72 (!) 121/56 (!) 142/58  Pulse: 61 71 78 75  Resp: 16 15 18 16   Temp:  97.8 F (36.6 C) 98.7 F (37.1 C) 98.3 F (36.8 C)  TempSrc:  Oral Oral Oral  SpO2: 94% 96% 97% 97%  Weight:      Height:        Intake/Output Summary (Last 24 hours) at 12/22/2018 1025 Last data filed at 12/22/2018 7867 Gross per 24 hour  Intake 3288.6 ml  Output 200 ml  Net 3088.6 ml   Filed Weights   12/14/18 2110 12/15/18 0117 12/17/18 1319  Weight: 77.1 kg 80.5 kg 80.5 kg    Examination: Constitutional: NAD, calm, comfortable Eyes: PERRL, lids and conjunctivae normal ENMT: Mucous membranes are moist. Posterior pharynx clear of any exudate or lesions.Normal dentition.  Neck: normal, supple, no masses, no thyromegaly Respiratory: clear to auscultation bilaterally, no wheezing, no crackles. Normal respiratory effort. No accessory muscle use.  Cardiovascular: Regular rate and rhythm, no murmurs / rubs / gallops. No extremity edema. 2+ pedal pulses. No carotid bruits.  Abdomen: no tenderness, no masses palpated. No hepatosplenomegaly. Bowel sounds positive.  Musculoskeletal: no clubbing / cyanosis. No joint deformity upper and lower extremities. Good ROM,  no contractures. Normal muscle tone.  Skin: no rashes, lesions, ulcers. No induration Neurologic: CN 2-12 grossly intact. Sensation intact, DTR normal. Strength 4+/5 in all 4.  Psychiatric: Normal judgment and insight. Alert and oriented x 3. Normal mood.    Data Reviewed:   CBC: Recent Labs  Lab 12/18/18 0438 12/19/18 0444 12/20/18 0553 12/21/18 0416 12/22/18 0358  WBC 10.0 16.6* 10.0 8.7 7.8  HGB 11.1* 12.0* 9.1* 8.9* 9.1*  HCT 33.5* 35.3* 26.5* 27.7* 28.8*  MCV 84.4 82.1 82.8 84.5 85.5  PLT 177 180 142* 141* 672*   Basic Metabolic Panel: Recent Labs  Lab 12/18/18 0438 12/19/18 0444 12/20/18 0553 12/21/18 0416 12/22/18 0358  NA 138 134* 138 139 135  K 3.7 4.0 3.1* 3.5 4.1  CL 104 103 109 109 112*  CO2 20* 21* 20* 21* 20*  GLUCOSE 143* 228* 141* 76 199*  BUN <5* 7* 15 9 14   CREATININE 1.10 1.32* 1.71* 1.23 1.47*  CALCIUM 9.3 8.9 8.4* 8.4* 7.6*  MG 1.4* 1.6* 1.7 1.8 1.7   GFR: Estimated Creatinine Clearance: 38.7 mL/min (A) (by C-G formula based on SCr of 1.47 mg/dL (H)). Liver Function Tests: Recent Labs  Lab 12/18/18 0438 12/19/18 0444 12/20/18 0553 12/21/18 0416 12/22/18 0358  AST 93* 87* 67* 56* 40  ALT 178* 111* 56* 49* 40  ALKPHOS 232* 231* 166* 176* 152*  BILITOT 2.7* 6.6* 4.5* 2.4* 1.8*  PROT 6.3* 6.2* 5.1* 5.3* 5.1*  ALBUMIN 2.5* 2.2* 1.9* 1.9* 1.8*   Recent Labs  Lab  12/19/18 0444  LIPASE 1,959*   No results for input(s): AMMONIA in the last 168 hours. Coagulation Profile: No results for input(s): INR, PROTIME in the last 168 hours. Cardiac Enzymes: Recent Labs  Lab 12/17/18 1813 12/17/18 2347 12/18/18 0438  TROPONINI <0.03 <0.03 <0.03   BNP (last 3 results) No results for input(s): PROBNP in the last 8760 hours. HbA1C: No results for input(s): HGBA1C in the last 72 hours. CBG: Recent Labs  Lab 12/21/18 1059 12/21/18 1152 12/21/18 1636 12/21/18 2156 12/22/18 0749  GLUCAP 99 116* 173* 253* 142*   Lipid Profile: No  results for input(s): CHOL, HDL, LDLCALC, TRIG, CHOLHDL, LDLDIRECT in the last 72 hours. Thyroid Function Tests: No results for input(s): TSH, T4TOTAL, FREET4, T3FREE, THYROIDAB in the last 72 hours. Anemia Panel: No results for input(s): VITAMINB12, FOLATE, FERRITIN, TIBC, IRON, RETICCTPCT in the last 72 hours. Sepsis Labs: Recent Labs  Lab 12/17/18 0353  PROCALCITON 5.68    Recent Results (from the past 240 hour(s))  Blood Culture (routine x 2)     Status: Abnormal   Collection Time: 12/14/18  9:00 PM  Result Value Ref Range Status   Specimen Description BLOOD RIGHT ARM  Final   Special Requests   Final    BOTTLES DRAWN AEROBIC AND ANAEROBIC Blood Culture results may not be optimal due to an excessive volume of blood received in culture bottles   Culture  Setup Time   Final    GRAM NEGATIVE RODS AEROBIC BOTTLE ONLY CRITICAL VALUE NOTED.  VALUE IS CONSISTENT WITH PREVIOUSLY REPORTED AND CALLED VALUE.    Culture (A)  Final    ESCHERICHIA COLI SUSCEPTIBILITIES PERFORMED ON PREVIOUS CULTURE WITHIN THE LAST 5 DAYS. Performed at [facility], [place] [address], [place], [place] [address]    Report Status 12/17/2018 FINAL  Final  Blood Culture (routine x 2)     Status: Abnormal   Collection Time: 12/14/18  9:15 PM  Result Value Ref Range Status   Specimen Description BLOOD RIGHT HAND  Final   Special Requests   Final    BOTTLES DRAWN AEROBIC ONLY Blood Culture results may not be optimal due to an excessive volume of blood received in culture bottles   Culture  Setup Time   Final    GRAM NEGATIVE RODS AEROBIC BOTTLE ONLY CRITICAL RESULT CALLED TO, READ BACK BY AND VERIFIED WITH: Karlene Einstein PharmD 15:05 12/15/18 ([email or web address]) Performed at [facility], [place] [address] [place], [place] [address]    Culture ESCHERICHIA COLI (A)  Final   Report Status 12/17/2018 FINAL  Final   Organism ID, Bacteria ESCHERICHIA COLI  Final      Susceptibility   Escherichia coli - MIC*     AMPICILLIN 8 SENSITIVE Sensitive     CEFAZOLIN <=4 SENSITIVE Sensitive     CEFEPIME <=1 SENSITIVE Sensitive     CEFTAZIDIME <=1 SENSITIVE Sensitive     CEFTRIAXONE <=1 SENSITIVE Sensitive     CIPROFLOXACIN <=0.25 SENSITIVE Sensitive     GENTAMICIN <=1 SENSITIVE Sensitive     IMIPENEM <=0.25 SENSITIVE Sensitive     TRIMETH/SULFA <=20 SENSITIVE Sensitive     AMPICILLIN/SULBACTAM <=2 SENSITIVE Sensitive     PIP/TAZO <=4 SENSITIVE Sensitive     Extended ESBL NEGATIVE Sensitive     * ESCHERICHIA COLI  Blood Culture ID Panel (Reflexed)     Status: Abnormal   Collection Time: 12/14/18  9:15 PM  Result Value Ref Range Status   Enterococcus species NOT  DETECTED NOT DETECTED Final   Listeria monocytogenes NOT DETECTED NOT DETECTED Final   Staphylococcus species NOT DETECTED NOT DETECTED Final   Staphylococcus aureus (BCID) NOT DETECTED NOT DETECTED Final   Streptococcus species NOT DETECTED NOT DETECTED Final   Streptococcus agalactiae NOT DETECTED NOT DETECTED Final   Streptococcus pneumoniae NOT DETECTED NOT DETECTED Final   Streptococcus pyogenes NOT DETECTED NOT DETECTED Final   Acinetobacter baumannii NOT DETECTED NOT DETECTED Final   Enterobacteriaceae species DETECTED (A) NOT DETECTED Final    Comment: Enterobacteriaceae represent a large family of gram-negative bacteria, not a single organism. CRITICAL RESULT CALLED TO, READ BACK BY AND VERIFIED WITH: Karlene Einstein PharmD 15:05 12/15/18 ([email or web address])    Enterobacter cloacae complex NOT DETECTED NOT DETECTED Final   Escherichia coli DETECTED (A) NOT DETECTED Final    Comment: CRITICAL RESULT CALLED TO, READ BACK BY AND VERIFIED WITH: Karlene Einstein PharmD 15:05 12/15/18 ([email or web address])    Klebsiella oxytoca NOT DETECTED NOT DETECTED Final   Klebsiella pneumoniae NOT DETECTED NOT DETECTED Final   Proteus species NOT DETECTED NOT DETECTED Final   Serratia marcescens NOT DETECTED NOT DETECTED Final   Carbapenem resistance NOT DETECTED NOT  DETECTED Final   Haemophilus influenzae NOT DETECTED NOT DETECTED Final   Neisseria meningitidis NOT DETECTED NOT DETECTED Final   Pseudomonas aeruginosa NOT DETECTED NOT DETECTED Final   Candida albicans NOT DETECTED NOT DETECTED Final   Candida glabrata NOT DETECTED NOT DETECTED Final   Candida krusei NOT DETECTED NOT DETECTED Final   Candida parapsilosis NOT DETECTED NOT DETECTED Final   Candida tropicalis NOT DETECTED NOT DETECTED Final    Comment: Performed at [facility], [place] [address] [place], [place] [address]  Urine culture     Status: None   Collection Time: 12/17/18  2:08 AM  Result Value Ref Range Status   Specimen Description URINE, CLEAN CATCH  Final   Special Requests NONE  Final   Culture   Final    NO GROWTH Performed at [facility], [place] [address] [place], [place] [address]    Report Status 12/18/2018 FINAL  Final         Radiology Studies: Dg Ercp Biliary & Pancreatic Ducts  Result Date: 12/21/2018 CLINICAL DATA:  Choledocholithiasis EXAM: ERCP TECHNIQUE: Multiple spot images obtained with the fluoroscopic device and submitted for interpretation post-procedure. FLUOROSCOPY TIME:  41 seconds COMPARISON:  MRCP - 12/19/2018 FINDINGS: Three spot fluoroscopic images of the right upper abdominal quadrant during ERCP are provided for review Initial image demonstrates an ERCP probe overlying the right upper abdominal quadrant. Surgical clips overlie the right upper abdomen. Median sternotomy wires overlie the lower chest. Subsequent image demonstrates selective cannulation opacification the common bile duct. There is a filling defect within the distal aspect of the CBD which may represent the suspected choledocholithiasis demonstrated on preceding MRCP. Completion image demonstrates further opacification of the common bile duct. IMPRESSION: ERCP with findings suggestive of choledocholithiasis. These images were submitted for radiologic interpretation only.  Please see the procedural report for the amount of contrast and the fluoroscopy time utilized. Electronically Signed   By: Sandi Mariscal M.D.   On: 12/21/2018 11:10        Scheduled Meds: . aspirin EC  81 mg Oral Daily  . cholecalciferol  1,000 Units Oral Daily  . cilostazol  100 mg Oral BID  . DULoxetine  40 mg Oral BID  . insulin aspart  0-9 Units Subcutaneous TID WC  . insulin  glargine  8 Units Subcutaneous BID  . isosorbide mononitrate  60 mg Oral Daily  . lubiprostone  24 mcg Oral Q breakfast  . pantoprazole  40 mg Oral Daily  . pregabalin  50 mg Oral TID  . tamsulosin  0.4 mg Oral Daily  . vitamin B-12  1,000 mcg Oral Daily   Continuous Infusions: . sodium chloride 125 mL/hr at 12/22/18 0921  . ampicillin-sulbactam (UNASYN) IV Stopped (12/22/18 0700)     LOS: 8 days   Time spent= 25 mins    Justin [name] Arsenio Loader, MD [facility]  If 7PM-7AM, please contact night-coverage [email or web address] 12/22/2018, 10:25 AM

## 2018-12-22 NOTE — Progress Notes (Signed)
Occupational Therapy Treatment Patient Details Name: Justin [name] MRN: [id number] DOB: 07/17/1933 Today's Date: 12/22/2018    History of present illness Pt is a 85 y/o with history of CAD status post CABG, hyperlipidemia, GERD, essential hypertension came to the hospital with complaints of upper abdominal pain, nausea and vomiting. Noted recent admission with similar complaints and underwent extensive workup.  S/p upper endoscopic ultrasound 3/20. Workup continued.    OT comments  Pt agreeable to therapy and mobilizing well with no AD, but using rail and furniture as needed today- prefers SPC. Pt performing sit to stand with supervisionA and stood at sink x5 mins for light grooming tasks. Pt performing ADL functional mobility with no AD and LOB losing footing going backwards 3x throughout session. Pt aware of deficits and stated that he would sit down if he were home. Pt would benefit from continued OT skilled services for ADL, mobility and safety in acute setting.    Follow Up Recommendations  No OT follow up;Supervision/Assistance - 24 hour    Equipment Recommendations  None recommended by OT    Recommendations for Other Services      Precautions / Restrictions Precautions Precautions: Fall Restrictions Weight Bearing Restrictions: No       Mobility Bed Mobility               General bed mobility comments: in recliner  Transfers Overall transfer level: Needs assistance   Transfers: Sit to/from Stand Sit to Stand: Min guard         General transfer comment: no LOb episodes for transfers     Balance                               High Level Balance Comments: requiring 1 hand on sink or side rail for mobility.           ADL either performed or assessed with clinical judgement   ADL Overall ADL's : Needs assistance/impaired     Grooming: Supervision/safety;Standing Grooming Details (indicate cue type and reason): no LOB episodes                                      Vision       Perception     Praxis      Cognition Arousal/Alertness: Awake/alert Behavior During Therapy: WFL for tasks assessed/performed Overall Cognitive Status: Within Functional Limits for tasks assessed                                          Exercises     Shoulder Instructions       General Comments Pt with HR <100BPM with mobility and O2 97% on RA.    Pertinent Vitals/ Pain       Pain Assessment: No/denies pain  Home Living                                          Prior Functioning/Environment              Frequency  Min 2X/week        Progress Toward Goals  OT Goals(current goals can now be found in  the care plan section)  Progress towards OT goals: Progressing toward goals  Acute Rehab OT Goals Patient Stated Goal: to get home and feel better OT Goal Formulation: With patient Time For Goal Achievement: 01/02/19 Potential to Achieve Goals: Good ADL Goals Pt Will Perform Grooming: with modified independence;standing Pt Will Perform Lower Body Dressing: with modified independence;sit to/from stand Pt Will Transfer to Toilet: with modified independence;ambulating;regular height toilet Pt Will Perform Tub/Shower Transfer: Tub transfer;ambulating;with modified independence Pt/caregiver will Perform Home Exercise Program: Both right and left upper extremity;With theraband;With written HEP provided  Plan Discharge plan remains appropriate    Co-evaluation                 AM-PAC OT "6 Clicks" Daily Activity     Outcome Measure   Help from another person eating meals?: None Help from another person taking care of personal grooming?: None Help from another person toileting, which includes using toliet, bedpan, or urinal?: A Little Help from another person bathing (including washing, rinsing, drying)?: A Little Help from another person to put on and taking off  regular upper body clothing?: A Little Help from another person to put on and taking off regular lower body clothing?: A Little 6 Click Score: 20    End of Session Equipment Utilized During Treatment: Gait belt  OT Visit Diagnosis: Muscle weakness (generalized) (M62.81)   Activity Tolerance Patient tolerated treatment well   Patient Left in chair;with call bell/phone within reach   Nurse Communication Mobility status        Time: 1014-1030 OT Time Calculation (min): 16 min  Charges: OT General Charges $OT Visit: 1 Visit OT Treatments $Self Care/Home Management : 8-22 mins  Darryl Nestle) Marsa Aris OTR/L Acute Rehabilitation Services Pager: [phone] Office: [phone]    Fredda Hammed 12/22/2018, 1:06 PM

## 2018-12-23 LAB — CBC
HCT: 28.1 % — ABNORMAL LOW (ref 39.0–52.0)
Hemoglobin: 9.2 g/dL — ABNORMAL LOW (ref 13.0–17.0)
MCH: 28.2 pg (ref 26.0–34.0)
MCHC: 32.7 g/dL (ref 30.0–36.0)
MCV: 86.2 fL (ref 80.0–100.0)
Platelets: 162 10*3/uL (ref 150–400)
RBC: 3.26 MIL/uL — ABNORMAL LOW (ref 4.22–5.81)
RDW: 15.9 % — ABNORMAL HIGH (ref 11.5–15.5)
WBC: 8.5 10*3/uL (ref 4.0–10.5)
nRBC: 0 % (ref 0.0–0.2)

## 2018-12-23 LAB — COMPREHENSIVE METABOLIC PANEL
ALT: 42 U/L (ref 0–44)
AST: 39 U/L (ref 15–41)
Albumin: 1.9 g/dL — ABNORMAL LOW (ref 3.5–5.0)
Alkaline Phosphatase: 133 U/L — ABNORMAL HIGH (ref 38–126)
Anion gap: 4 — ABNORMAL LOW (ref 5–15)
BUN: 14 mg/dL (ref 8–23)
CO2: 22 mmol/L (ref 22–32)
Calcium: 8.1 mg/dL — ABNORMAL LOW (ref 8.9–10.3)
Chloride: 113 mmol/L — ABNORMAL HIGH (ref 98–111)
Creatinine, Ser: 1.3 mg/dL — ABNORMAL HIGH (ref 0.61–1.24)
GFR calc Af Amer: 58 mL/min — ABNORMAL LOW (ref >60)
GFR calc non Af Amer: 50 mL/min — ABNORMAL LOW (ref >60)
Glucose, Bld: 114 mg/dL — ABNORMAL HIGH (ref 70–99)
Potassium: 3.5 mmol/L (ref 3.5–5.1)
Sodium: 139 mmol/L (ref 135–145)
Total Bilirubin: 1.2 mg/dL (ref 0.3–1.2)
Total Protein: 5.2 g/dL — ABNORMAL LOW (ref 6.5–8.1)

## 2018-12-23 LAB — MAGNESIUM: Magnesium: 1.6 mg/dL — ABNORMAL LOW (ref 1.7–2.4)

## 2018-12-23 LAB — GLUCOSE, CAPILLARY: Glucose-Capillary: 85 mg/dL (ref 70–99)

## 2018-12-23 MED ORDER — AMOXICILLIN-POT CLAVULANATE 875-125 MG PO TABS: 1.0000 | ORAL_TABLET | Freq: Two times a day (BID) | ORAL | 0 refills | Status: AC

## 2018-12-23 MED ORDER — POTASSIUM CHLORIDE CRYS ER 20 MEQ PO TBCR
40.0000 meq | EXTENDED_RELEASE_TABLET | Freq: Once | ORAL | Status: AC
  Administered 2018-12-23: 40 meq via ORAL
  Filled 2018-12-23: qty 2

## 2018-12-23 MED ORDER — MAGNESIUM SULFATE 2 GM/50ML IV SOLN
2.0000 g | Freq: Once | INTRAVENOUS | Status: AC
  Administered 2018-12-23: 2 g via INTRAVENOUS
  Filled 2018-12-23: qty 50

## 2018-12-23 MED FILL — AMOX-CLAV 875-125 MG TABLET: 875-125 | 2 days supply | Qty: 4 | Fill #0

## 2018-12-23 NOTE — Care Management Important Message (Signed)
Important Message  Patient Details  Name: Justin [name] MRN: [id number] Date of Birth: 08/10/1933   Medicare Important Message Given:  Yes    Thelia Tanksley [address] 12/23/2018, 3:36 PM

## 2018-12-23 NOTE — Progress Notes (Signed)
Pt seen by MD, orders written for discharge. RN went over discharge instructions with pt and answered all questions. RN removed IV's. Escorted for discharge via wheelchair with all belongings. Pt will follow up outpatient with MD.  

## 2018-12-23 NOTE — Progress Notes (Signed)
Subjective: No complaints.  Feeling well.  Objective: Vital signs in last 24 hours: Temp:  [98 F (36.7 C)-98.1 F (36.7 C)] 98.1 F (36.7 C) (03/26 0527) Pulse Rate:  [78-90] 90 (03/26 0527) Resp:  [16-18] 18 (03/26 0527) BP: (138-162)/(67-76) 162/76 (03/26 0527) SpO2:  [96 %-97 %] 96 % (03/26 0527) Last BM Date: 12/22/18  Intake/Output from previous day: 03/25 0701 - 03/26 0700 In: 3122 [I.V.:2622; IV Piggyback:500] Out: 425 [Urine:425] Intake/Output this shift: No intake/output data recorded.  General appearance: alert and no distress GI: soft, non-tender; bowel sounds normal; no masses,  no organomegaly  Lab Results: Recent Labs    12/21/18 0416 12/22/18 0358 12/23/18 0404  WBC 8.7 7.8 8.5  HGB 8.9* 9.1* 9.2*  HCT 27.7* 28.8* 28.1*  PLT 141* 149* 162   BMET Recent Labs    12/21/18 0416 12/22/18 0358 12/23/18 0404  NA 139 135 139  K 3.5 4.1 3.5  CL 109 112* 113*  CO2 21* 20* 22  GLUCOSE 76 199* 114*  BUN 9 14 14   CREATININE 1.23 1.47* 1.30*  CALCIUM 8.4* 7.6* 8.1*   LFT Recent Labs    12/23/18 0404  PROT 5.2*  ALBUMIN 1.9*  AST 39  ALT 42  ALKPHOS 133*  BILITOT 1.2   PT/INR No results for input(s): LABPROT, INR in the last 72 hours. Hepatitis Panel No results for input(s): HEPBSAG, HCVAB, HEPAIGM, HEPBIGM in the last 72 hours. C-Diff No results for input(s): CDIFFTOX in the last 72 hours. Fecal Lactopherrin No results for input(s): FECLLACTOFRN in the last 72 hours.  Studies/Results: Dg Ercp Biliary & Pancreatic Ducts  Result Date: 12/21/2018 CLINICAL DATA:  Choledocholithiasis EXAM: ERCP TECHNIQUE: Multiple spot images obtained with the fluoroscopic device and submitted for interpretation post-procedure. FLUOROSCOPY TIME:  41 seconds COMPARISON:  MRCP - 12/19/2018 FINDINGS: Three spot fluoroscopic images of the right upper abdominal quadrant during ERCP are provided for review Initial image demonstrates an ERCP probe overlying the right  upper abdominal quadrant. Surgical clips overlie the right upper abdomen. Median sternotomy wires overlie the lower chest. Subsequent image demonstrates selective cannulation opacification the common bile duct. There is a filling defect within the distal aspect of the CBD which may represent the suspected choledocholithiasis demonstrated on preceding MRCP. Completion image demonstrates further opacification of the common bile duct. IMPRESSION: ERCP with findings suggestive of choledocholithiasis. These images were submitted for radiologic interpretation only. Please see the procedural report for the amount of contrast and the fluoroscopy time utilized. Electronically Signed   By: Sandi Mariscal M.D.   On: 12/21/2018 11:10    Medications:  Scheduled: . aspirin EC  81 mg Oral Daily  . cholecalciferol  1,000 Units Oral Daily  . cilostazol  100 mg Oral BID  . DULoxetine  40 mg Oral BID  . insulin aspart  0-9 Units Subcutaneous TID WC  . insulin glargine  8 Units Subcutaneous BID  . isosorbide mononitrate  60 mg Oral Daily  . lubiprostone  24 mcg Oral Q breakfast  . pantoprazole  40 mg Oral Daily  . pregabalin  50 mg Oral TID  . tamsulosin  0.4 mg Oral Daily  . vitamin B-12  1,000 mcg Oral Daily   Continuous: . sodium chloride 125 mL/hr at 12/23/18 0438  . ampicillin-sulbactam (UNASYN) IV 3 g (12/23/18 0440)  . magnesium sulfate 1 - 4 g bolus IVPB 2 g (12/23/18 0906)    Assessment/Plan: 1) Choledocholithiasis 2) GERD   He is well today.  The patient tolerated PO intake without any GERD issues.  His liver enzymes have essentially normalized and clinically he feels well.  Plan: 1) Okay to D/C home. 2) Signing off.   LOS: 9 days   Anahy Esh D 12/23/2018, 9:48 AM

## 2018-12-23 NOTE — Discharge Summary (Signed)
Physician Discharge Summary  Justin [name] ZOX:[id number] DOB: 04-29-1933 DOA: 12/14/2018  PCP: [name], MD  Admit date: 12/14/2018 Discharge date: 12/23/2018  Admitted From: Home  Disposition:  Home   Recommendations for Outpatient Follow-up:  1. Follow up with PCP in 1-2 weeks 2. Please obtain BMP/CBC in one week your next doctors visit.  3. Augmetin for 2 more days.   Discharge Condition: Stable CODE STATUS: Full  Diet recommendation: Cardiac  Brief/Interim Summary: 85 year old with history of CAD status post CABG, hyperlipidemia, GERD, essential hypertension came to the hospital with complaints of upper abdominal pain, nausea and vomiting.  He was admitted for similar reason about 2 months ago and underwent extensive work-up including MRCP, ultrasound which showed dilated CBD.  Acute hepatitis panel was negative at that time.  Gallbladder stone was removed.  Upon admission he was noted to have significantly elevated LFTs with elevated WBC.  He was started on Zosyn and admitted to the hospital.  IgG levels negative, ANA levels- negative.  Blood cultures ended up growing gram-negative rods, ended up being E. coli.  It is pansensitive therefore will change IV Zosyn to cefazolin but later swtiched to unasyn. . EUS- neg. Due to diarrhea- C diff ordered, results pending. LFTs and lipase went up and MRCP was positive for stone. ERCP showed stone which was removed and biliary sphincterotomy was performed.  Cleared by GI, will discharge on 2 more days of oral Augmentin. Follow-up outpatient.   Discharge Diagnoses:  Principal Problem:   Sepsis ([place]) Active Problems:   Hypertension   GERD (gastroesophageal reflux disease)   S/P CABG (coronary artery bypass graft)   CAD (coronary artery disease)   Acute renal failure superimposed on stage 3 chronic kidney disease (HCC)   UTI (urinary tract infection)   HLD (hyperlipidemia)   Abnormal LFTs   Abdominal distention  Acute hepatitis  with elevated LFTs, improved Acute pancreatitis, resolved Elevated total bilirubin with Obstruction. Choledocholithiasis resolved - LFTs improving.  S/p EUS- dilation in the common bile duct whichmeasured up to 6 mm. No stone or sludging seen at that time. -MRCP positive for CBD stone.  ERCP performed 3/24 status post stone removal and biliary sphincterotomy. -Repeat lipase within normal limits -Diet as tolerated. -IgG level-negative, ANA= negative -Right upper quadrant-negative for any acute abnormality besides history of cholecystectomy. -Antiemetics PRN -PPI, placed Liver Bx on Hold due to current deferral for non emergent procedures. Would request GI to communicate with IR if still needed.   Hypokalemia/Hypomagnesemia.  -Aggressive repletion  Gram-negative rod bacteremia, E. coli/Enterobacter -Changed to oral Augmentin for 2 more days outpatient to complete 10-day course  History of coronary artery disease status post CABG -No chest pain.  Continue aspirin and Imdur.  Statin on hold due to abnormal LFTs  Essential hypertension -Continue checking this periodically at home.  Acute kidney injury on CKD stage III -Main stable at baseline now at 1.5.  Diabetes mellitus type 2, insulin-dependent -Resume home diabetic regimen  Peripheral neuropathy secondary to diabetes mellitus type 2 -On Lyrica 50 mg 3 times daily  BPH -Continue Flomax   Consultations:  Gastroenterology  Subjective: Does not have any complaints.  Tolerating oral diet without any issues.  Eager to go home.  Discharge Exam: Vitals:   12/22/18 1319 12/23/18 0527  BP: 138/67 (!) 162/76  Pulse: 78 90  Resp: 16 18  Temp: 98 F (36.7 C) 98.1 F (36.7 C)  SpO2: 97% 96%   Vitals:   12/21/18 2157 12/22/18 0527 12/22/18 1319  12/23/18 0527  BP: (!) 121/56 (!) 142/58 138/67 (!) 162/76  Pulse: 78 75 78 90  Resp: 18 16 16 18   Temp: 98.7 F (37.1 C) 98.3 F (36.8 C) 98 F (36.7 C) 98.1 F (36.7  C)  TempSrc: Oral Oral Oral Oral  SpO2: 97% 97% 97% 96%  Weight:      Height:        General: Pt is alert, awake, not in acute distress Cardiovascular: RRR, S1/S2 +, no rubs, no gallops Respiratory: CTA bilaterally, no wheezing, no rhonchi Abdominal: Soft, NT, ND, bowel sounds + Extremities: no edema, no cyanosis  Discharge Instructions   Allergies as of 12/23/2018      Reactions   Dye Fdc Red [red Dye] Swelling, Other (See Comments)   CAT scan dye   Ibuprofen Other (See Comments)   Upset stomach       Medication List    TAKE these medications   acetaminophen 500 MG tablet Commonly known as:  TYLENOL Take 500 mg by mouth daily as needed for mild pain.   Amitiza 24 MCG capsule Generic drug:  lubiprostone Take 24 mcg by mouth daily with breakfast.   amoxicillin-clavulanate 875-125 MG tablet Commonly known as:  Augmentin Take 1 tablet by mouth every 12 (twelve) hours for 2 days.   aspirin EC 81 MG tablet Take 81 mg by mouth daily.   chlorthalidone 25 MG tablet Commonly known as:  HYGROTON Take 25 mg by mouth daily.   cholecalciferol 25 MCG (1000 UT) tablet Commonly known as:  VITAMIN D3 Take 1,000 Units by mouth daily.   cilostazol 100 MG tablet Commonly known as:  PLETAL Take 1 tablet (100 mg total) by mouth 2 (two) times daily.   clonazePAM 0.5 MG tablet Commonly known as:  KLONOPIN Take 0.5 mg by mouth daily as needed for anxiety.   DULoxetine HCl 40 MG Cpep Take 1 capsule by mouth 2 (two) times daily.   isosorbide mononitrate 60 MG 24 hr tablet Commonly known as:  IMDUR Take 60 mg by mouth daily.   Januvia 100 MG tablet Generic drug:  sitaGLIPtin Take 100 mg by mouth daily.   Lantus SoloStar 100 UNIT/ML Solostar Pen Generic drug:  Insulin Glargine Inject 20 Units into the skin 2 (two) times daily.   metoprolol tartrate 50 MG tablet Commonly known as:  LOPRESSOR Take 50 mg by mouth 2 (two) times daily.   nitroGLYCERIN 0.4 MG SL  tablet Commonly known as:  NITROSTAT Place 1 tablet (0.4 mg total) under the tongue every 5 (five) minutes as needed for chest pain.   omeprazole 20 MG capsule Commonly known as:  PRILOSEC Take 20 mg by mouth daily.   pregabalin 50 MG capsule Commonly known as:  LYRICA Take 50 mg by mouth 3 (three) times daily.   simvastatin 20 MG tablet Commonly known as:  ZOCOR Take 20 mg by mouth daily.   tamsulosin 0.4 MG Caps capsule Commonly known as:  FLOMAX Take 0.4 mg by mouth daily.   vitamin B-12 1000 MCG tablet Commonly known as:  CYANOCOBALAMIN Take 1,000 mcg by mouth daily.       Allergies  Allergen Reactions  . Dye Fdc Red [Red Dye] Swelling and Other (See Comments)    CAT scan dye  . Ibuprofen Other (See Comments)    Upset stomach     You were cared for by a hospitalist during your hospital stay. If you have any questions about your discharge medications or the care  you received while you were in the hospital after you are discharged, you can call the unit and asked to speak with the hospitalist on call if the hospitalist that took care of you is not available. Once you are discharged, your primary care physician will handle any further medical issues. Please note that no refills for any discharge medications will be authorized once you are discharged, as it is imperative that you return to your primary care physician (or establish a relationship with a primary care physician if you do not have one) for your aftercare needs so that they can reassess your need for medications and monitor your lab values.   Procedures/Studies: Dg Abd 1 View  Result Date: 12/18/2018 CLINICAL DATA:  Abdominal distension EXAM: ABDOMEN - 1 VIEW COMPARISON:  12/17/2018 FINDINGS: Scattered large and small bowel gas is seen. No obstructive changes are identified. No free air is seen. No abnormal mass is noted. Degenerative changes of lumbar spine are noted. IMPRESSION: No acute abnormality seen  Electronically Signed   By: Inez Catalina M.D.   On: 12/18/2018 16:21   Mr 3d Recon At Scanner  Result Date: 12/19/2018 CLINICAL DATA:  Worsening total bilirubin with elevated lipase. EXAM: MRI ABDOMEN WITHOUT AND WITH CONTRAST (INCLUDING MRCP) TECHNIQUE: Multiplanar multisequence MR imaging of the abdomen was performed both before and after the administration of intravenous contrast. Heavily T2-weighted images of the biliary and pancreatic ducts were obtained, and three-dimensional MRCP images were rendered by post processing. CONTRAST:  8 cc Gadavist. COMPARISON:  10/13/2018.  Abdomen pelvis CT 10/12/2018. FINDINGS: Lower chest: Basilar atelectasis with tiny left effusion. Hepatobiliary: Motion degraded study with heterogeneous enhancement of liver parenchyma. No definite enhancing liver mass. Gallbladder surgically absent. Extrahepatic common duct measures 9 mm diameter. Common bile duct in the head of the pancreas is 7 mm diameter. 4 x 4 x 7 mm stone is identified in the distal common bile duct (axial haste image 29/series 4, axial T2 image 30/series 6, coronal MRCP image 32/series 10). Pancreas: No focal mass lesion. No dilatation of the main duct. No intraparenchymal cyst. No peripancreatic edema. Spleen:  No splenomegaly. No focal mass lesion. Adrenals/Urinary Tract: No adrenal nodule or mass. Kidneys unremarkable Stomach/Bowel: Small hiatal hernia. Stomach otherwise unremarkable. Duodenum is normally positioned as is the ligament of Treitz. No small bowel or colonic dilatation within the visualized abdomen. Vascular/Lymphatic: No abdominal aortic aneurysm. No abdominal lymphadenopathy. Other:  No intraperitoneal free fluid. Musculoskeletal: No abnormal marrow enhancement within the visualized bony anatomy. Bilateral gynecomastia evident. IMPRESSION: 1. 4 x 4 x 7 mm stone in the distal common bile duct with mild extrahepatic biliary duct distension. 2. No associated pancreatic ductal dilatation. 3. Small  hiatal hernia. Electronically Signed   By: Misty Stanley M.D.   On: 12/19/2018 16:49   Dg Chest Port 1 View  Result Date: 12/14/2018 CLINICAL DATA:  Nausea and vomiting with recent fever, initial encounter EXAM: PORTABLE CHEST 1 VIEW COMPARISON:  None. FINDINGS: Cardiac shadows within normal limits. Postsurgical changes are seen. The lungs are clear bilaterally. No acute bony abnormality is noted. IMPRESSION: No active disease. Electronically Signed   By: Inez Catalina M.D.   On: 12/14/2018 21:35   Dg Ercp Biliary & Pancreatic Ducts  Result Date: 12/21/2018 CLINICAL DATA:  Choledocholithiasis EXAM: ERCP TECHNIQUE: Multiple spot images obtained with the fluoroscopic device and submitted for interpretation post-procedure. FLUOROSCOPY TIME:  41 seconds COMPARISON:  MRCP - 12/19/2018 FINDINGS: Three spot fluoroscopic images of the right upper abdominal  quadrant during ERCP are provided for review Initial image demonstrates an ERCP probe overlying the right upper abdominal quadrant. Surgical clips overlie the right upper abdomen. Median sternotomy wires overlie the lower chest. Subsequent image demonstrates selective cannulation opacification the common bile duct. There is a filling defect within the distal aspect of the CBD which may represent the suspected choledocholithiasis demonstrated on preceding MRCP. Completion image demonstrates further opacification of the common bile duct. IMPRESSION: ERCP with findings suggestive of choledocholithiasis. These images were submitted for radiologic interpretation only. Please see the procedural report for the amount of contrast and the fluoroscopy time utilized. Electronically Signed   By: Sandi Mariscal M.D.   On: 12/21/2018 11:10   Dg Abd Acute 2+v W 1v Chest  Result Date: 12/17/2018 CLINICAL DATA:  Shortness of breath and abdominal pain EXAM: DG ABDOMEN ACUTE W/ 1V CHEST COMPARISON:  12/14/2018 FINDINGS: Cardiac shadow is stable. Postsurgical changes are again seen.  The lungs are well aerated bilaterally with minimal basilar atelectasis particularly on the left. Scattered large and small bowel gas is noted. Endoscopy clip and changes of prior cholecystectomy are seen. No free air is noted. No obstructive changes are seen. Scattered right renal calculi are noted. No acute bony abnormality is seen. IMPRESSION: Chronic appearing changes as described above. No acute abnormality is noted. Electronically Signed   By: Inez Catalina M.D.   On: 12/17/2018 18:55   Mr Abdomen Mrcp Moise Boring Contast  Result Date: 12/19/2018 CLINICAL DATA:  Worsening total bilirubin with elevated lipase. EXAM: MRI ABDOMEN WITHOUT AND WITH CONTRAST (INCLUDING MRCP) TECHNIQUE: Multiplanar multisequence MR imaging of the abdomen was performed both before and after the administration of intravenous contrast. Heavily T2-weighted images of the biliary and pancreatic ducts were obtained, and three-dimensional MRCP images were rendered by post processing. CONTRAST:  8 cc Gadavist. COMPARISON:  10/13/2018.  Abdomen pelvis CT 10/12/2018. FINDINGS: Lower chest: Basilar atelectasis with tiny left effusion. Hepatobiliary: Motion degraded study with heterogeneous enhancement of liver parenchyma. No definite enhancing liver mass. Gallbladder surgically absent. Extrahepatic common duct measures 9 mm diameter. Common bile duct in the head of the pancreas is 7 mm diameter. 4 x 4 x 7 mm stone is identified in the distal common bile duct (axial haste image 29/series 4, axial T2 image 30/series 6, coronal MRCP image 32/series 10). Pancreas: No focal mass lesion. No dilatation of the main duct. No intraparenchymal cyst. No peripancreatic edema. Spleen:  No splenomegaly. No focal mass lesion. Adrenals/Urinary Tract: No adrenal nodule or mass. Kidneys unremarkable Stomach/Bowel: Small hiatal hernia. Stomach otherwise unremarkable. Duodenum is normally positioned as is the ligament of Treitz. No small bowel or colonic dilatation  within the visualized abdomen. Vascular/Lymphatic: No abdominal aortic aneurysm. No abdominal lymphadenopathy. Other:  No intraperitoneal free fluid. Musculoskeletal: No abnormal marrow enhancement within the visualized bony anatomy. Bilateral gynecomastia evident. IMPRESSION: 1. 4 x 4 x 7 mm stone in the distal common bile duct with mild extrahepatic biliary duct distension. 2. No associated pancreatic ductal dilatation. 3. Small hiatal hernia. Electronically Signed   By: Misty Stanley M.D.   On: 12/19/2018 16:49   US Abdomen Limited Ruq  Result Date: 12/14/2018 CLINICAL DATA:  Pain, vomiting EXAM: ULTRASOUND ABDOMEN LIMITED RIGHT UPPER QUADRANT COMPARISON:  CT and ultrasound 10/12/2018 FINDINGS: Gallbladder: Prior cholecystectomy Common bile duct: Diameter: Normal caliber for post cholecystectomy state, 7 mm. Liver: No focal lesion identified. Within normal limits in parenchymal echogenicity. Portal vein is patent on color Doppler imaging with normal direction  of blood flow towards the liver. IMPRESSION: Prior cholecystectomy.  No acute findings. Electronically Signed   By: Rolm Baptise M.D.   On: 12/14/2018 23:27      The results of significant diagnostics from this hospitalization (including imaging, microbiology, ancillary and laboratory) are listed below for reference.     Microbiology: Recent Results (from the past 240 hour(s))  Blood Culture (routine x 2)     Status: Abnormal   Collection Time: 12/14/18  9:00 PM  Result Value Ref Range Status   Specimen Description BLOOD RIGHT ARM  Final   Special Requests   Final    BOTTLES DRAWN AEROBIC AND ANAEROBIC Blood Culture results may not be optimal due to an excessive volume of blood received in culture bottles   Culture  Setup Time   Final    GRAM NEGATIVE RODS AEROBIC BOTTLE ONLY CRITICAL VALUE NOTED.  VALUE IS CONSISTENT WITH PREVIOUSLY REPORTED AND CALLED VALUE.    Culture (A)  Final    ESCHERICHIA COLI SUSCEPTIBILITIES PERFORMED ON  PREVIOUS CULTURE WITHIN THE LAST 5 DAYS. Performed at [facility], [place] [address], [place], [place] [address]    Report Status 12/17/2018 FINAL  Final  Blood Culture (routine x 2)     Status: Abnormal   Collection Time: 12/14/18  9:15 PM  Result Value Ref Range Status   Specimen Description BLOOD RIGHT HAND  Final   Special Requests   Final    BOTTLES DRAWN AEROBIC ONLY Blood Culture results may not be optimal due to an excessive volume of blood received in culture bottles   Culture  Setup Time   Final    GRAM NEGATIVE RODS AEROBIC BOTTLE ONLY CRITICAL RESULT CALLED TO, READ BACK BY AND VERIFIED WITH: Karlene Einstein PharmD 15:05 12/15/18 ([email or web address]) Performed at [facility], [place] [address], [place], [place] [address]    Culture ESCHERICHIA COLI (A)  Final   Report Status 12/17/2018 FINAL  Final   Organism ID, Bacteria ESCHERICHIA COLI  Final      Susceptibility   Escherichia coli - MIC*    AMPICILLIN 8 SENSITIVE Sensitive     CEFAZOLIN <=4 SENSITIVE Sensitive     CEFEPIME <=1 SENSITIVE Sensitive     CEFTAZIDIME <=1 SENSITIVE Sensitive     CEFTRIAXONE <=1 SENSITIVE Sensitive     CIPROFLOXACIN <=0.25 SENSITIVE Sensitive     GENTAMICIN <=1 SENSITIVE Sensitive     IMIPENEM <=0.25 SENSITIVE Sensitive     TRIMETH/SULFA <=20 SENSITIVE Sensitive     AMPICILLIN/SULBACTAM <=2 SENSITIVE Sensitive     PIP/TAZO <=4 SENSITIVE Sensitive     Extended ESBL NEGATIVE Sensitive     * ESCHERICHIA COLI  Blood Culture ID Panel (Reflexed)     Status: Abnormal   Collection Time: 12/14/18  9:15 PM  Result Value Ref Range Status   Enterococcus species NOT DETECTED NOT DETECTED Final   Listeria monocytogenes NOT DETECTED NOT DETECTED Final   Staphylococcus species NOT DETECTED NOT DETECTED Final   Staphylococcus aureus (BCID) NOT DETECTED NOT DETECTED Final   Streptococcus species NOT DETECTED NOT DETECTED Final   Streptococcus agalactiae NOT DETECTED NOT DETECTED Final   Streptococcus  pneumoniae NOT DETECTED NOT DETECTED Final   Streptococcus pyogenes NOT DETECTED NOT DETECTED Final   Acinetobacter baumannii NOT DETECTED NOT DETECTED Final   Enterobacteriaceae species DETECTED (A) NOT DETECTED Final    Comment: Enterobacteriaceae represent a large family of gram-negative bacteria, not a single organism. CRITICAL RESULT CALLED TO, READ  BACK BY AND VERIFIED WITH: Karlene Einstein PharmD 15:05 12/15/18 ([email or web address])    Enterobacter cloacae complex NOT DETECTED NOT DETECTED Final   Escherichia coli DETECTED (A) NOT DETECTED Final    Comment: CRITICAL RESULT CALLED TO, READ BACK BY AND VERIFIED WITH: Karlene Einstein PharmD 15:05 12/15/18 ([email or web address])    Klebsiella oxytoca NOT DETECTED NOT DETECTED Final   Klebsiella pneumoniae NOT DETECTED NOT DETECTED Final   Proteus species NOT DETECTED NOT DETECTED Final   Serratia marcescens NOT DETECTED NOT DETECTED Final   Carbapenem resistance NOT DETECTED NOT DETECTED Final   Haemophilus influenzae NOT DETECTED NOT DETECTED Final   Neisseria meningitidis NOT DETECTED NOT DETECTED Final   Pseudomonas aeruginosa NOT DETECTED NOT DETECTED Final   Candida albicans NOT DETECTED NOT DETECTED Final   Candida glabrata NOT DETECTED NOT DETECTED Final   Candida krusei NOT DETECTED NOT DETECTED Final   Candida parapsilosis NOT DETECTED NOT DETECTED Final   Candida tropicalis NOT DETECTED NOT DETECTED Final    Comment: Performed at [facility], [place] [address] [place], [place] [address]  Urine culture     Status: None   Collection Time: 12/17/18  2:08 AM  Result Value Ref Range Status   Specimen Description URINE, CLEAN CATCH  Final   Special Requests NONE  Final   Culture   Final    NO GROWTH Performed at [facility], [place] [address] [place], [place] [address]    Report Status 12/18/2018 FINAL  Final     Labs: BNP (last 3 results) No results for input(s): BNP in the last 8760 hours. Basic Metabolic Panel: Recent Labs  Lab  12/19/18 0444 12/20/18 0553 12/21/18 0416 12/22/18 0358 12/23/18 0404  NA 134* 138 139 135 139  K 4.0 3.1* 3.5 4.1 3.5  CL 103 109 109 112* 113*  CO2 21* 20* 21* 20* 22  GLUCOSE 228* 141* 76 199* 114*  BUN 7* 15 9 14 14   CREATININE 1.32* 1.71* 1.23 1.47* 1.30*  CALCIUM 8.9 8.4* 8.4* 7.6* 8.1*  MG 1.6* 1.7 1.8 1.7 1.6*   Liver Function Tests: Recent Labs  Lab 12/19/18 0444 12/20/18 0553 12/21/18 0416 12/22/18 0358 12/23/18 0404  AST 87* 67* 56* 40 39  ALT 111* 56* 49* 40 42  ALKPHOS 231* 166* 176* 152* 133*  BILITOT 6.6* 4.5* 2.4* 1.8* 1.2  PROT 6.2* 5.1* 5.3* 5.1* 5.2*  ALBUMIN 2.2* 1.9* 1.9* 1.8* 1.9*   Recent Labs  Lab 12/19/18 0444 12/22/18 0358  LIPASE 1,959* 101*   No results for input(s): AMMONIA in the last 168 hours. CBC: Recent Labs  Lab 12/19/18 0444 12/20/18 0553 12/21/18 0416 12/22/18 0358 12/23/18 0404  WBC 16.6* 10.0 8.7 7.8 8.5  HGB 12.0* 9.1* 8.9* 9.1* 9.2*  HCT 35.3* 26.5* 27.7* 28.8* 28.1*  MCV 82.1 82.8 84.5 85.5 86.2  PLT 180 142* 141* 149* 162   Cardiac Enzymes: Recent Labs  Lab 12/17/18 1813 12/17/18 2347 12/18/18 0438  TROPONINI <0.03 <0.03 <0.03   BNP: Invalid input(s): POCBNP CBG: Recent Labs  Lab 12/22/18 0749 12/22/18 1156 12/22/18 1650 12/22/18 2050 12/23/18 0743  GLUCAP 142* 198* 173* 171* 85   D-Dimer No results for input(s): DDIMER in the last 72 hours. Hgb A1c No results for input(s): HGBA1C in the last 72 hours. Lipid Profile No results for input(s): CHOL, HDL, LDLCALC, TRIG, CHOLHDL, LDLDIRECT in the last 72 hours. Thyroid function studies No results for input(s): TSH, T4TOTAL, T3FREE, THYROIDAB in the last 72 hours.  Invalid input(s): FREET3 Anemia work up No results for input(s): VITAMINB12, FOLATE, FERRITIN, TIBC, IRON, RETICCTPCT in the last 72 hours. Urinalysis    Component Value Date/Time   COLORURINE YELLOW 12/14/2018 2114   APPEARANCEUR CLOUDY (A) 12/14/2018 2114   LABSPEC 1.025  12/14/2018 2114   PHURINE 5.5 12/14/2018 2114   GLUCOSEU 100 (A) 12/14/2018 2114   HGBUR SMALL (A) 12/14/2018 2114   BILIRUBINUR MODERATE (A) 12/14/2018 2114   KETONESUR 15 (A) 12/14/2018 2114   PROTEINUR 100 (A) 12/14/2018 2114   NITRITE NEGATIVE 12/14/2018 2114   LEUKOCYTESUR MODERATE (A) 12/14/2018 2114   Sepsis Labs Invalid input(s): PROCALCITONIN,  WBC,  LACTICIDVEN Microbiology Recent Results (from the past 240 hour(s))  Blood Culture (routine x 2)     Status: Abnormal   Collection Time: 12/14/18  9:00 PM  Result Value Ref Range Status   Specimen Description BLOOD RIGHT ARM  Final   Special Requests   Final    BOTTLES DRAWN AEROBIC AND ANAEROBIC Blood Culture results may not be optimal due to an excessive volume of blood received in culture bottles   Culture  Setup Time   Final    GRAM NEGATIVE RODS AEROBIC BOTTLE ONLY CRITICAL VALUE NOTED.  VALUE IS CONSISTENT WITH PREVIOUSLY REPORTED AND CALLED VALUE.    Culture (A)  Final    ESCHERICHIA COLI SUSCEPTIBILITIES PERFORMED ON PREVIOUS CULTURE WITHIN THE LAST 5 DAYS. Performed at [facility], [place] [address] [place], [place] [address]    Report Status 12/17/2018 FINAL  Final  Blood Culture (routine x 2)     Status: Abnormal   Collection Time: 12/14/18  9:15 PM  Result Value Ref Range Status   Specimen Description BLOOD RIGHT HAND  Final   Special Requests   Final    BOTTLES DRAWN AEROBIC ONLY Blood Culture results may not be optimal due to an excessive volume of blood received in culture bottles   Culture  Setup Time   Final    GRAM NEGATIVE RODS AEROBIC BOTTLE ONLY CRITICAL RESULT CALLED TO, READ BACK BY AND VERIFIED WITH: Karlene Einstein PharmD 15:05 12/15/18 ([email or web address]) Performed at [facility], [place] [address], [place], [place] [address]    Culture ESCHERICHIA COLI (A)  Final   Report Status 12/17/2018 FINAL  Final   Organism ID, Bacteria ESCHERICHIA COLI  Final      Susceptibility   Escherichia coli -  MIC*    AMPICILLIN 8 SENSITIVE Sensitive     CEFAZOLIN <=4 SENSITIVE Sensitive     CEFEPIME <=1 SENSITIVE Sensitive     CEFTAZIDIME <=1 SENSITIVE Sensitive     CEFTRIAXONE <=1 SENSITIVE Sensitive     CIPROFLOXACIN <=0.25 SENSITIVE Sensitive     GENTAMICIN <=1 SENSITIVE Sensitive     IMIPENEM <=0.25 SENSITIVE Sensitive     TRIMETH/SULFA <=20 SENSITIVE Sensitive     AMPICILLIN/SULBACTAM <=2 SENSITIVE Sensitive     PIP/TAZO <=4 SENSITIVE Sensitive     Extended ESBL NEGATIVE Sensitive     * ESCHERICHIA COLI  Blood Culture ID Panel (Reflexed)     Status: Abnormal   Collection Time: 12/14/18  9:15 PM  Result Value Ref Range Status   Enterococcus species NOT DETECTED NOT DETECTED Final   Listeria monocytogenes NOT DETECTED NOT DETECTED Final   Staphylococcus species NOT DETECTED NOT DETECTED Final   Staphylococcus aureus (BCID) NOT DETECTED NOT DETECTED Final   Streptococcus species NOT DETECTED NOT DETECTED Final   Streptococcus agalactiae NOT DETECTED NOT DETECTED Final  Streptococcus pneumoniae NOT DETECTED NOT DETECTED Final   Streptococcus pyogenes NOT DETECTED NOT DETECTED Final   Acinetobacter baumannii NOT DETECTED NOT DETECTED Final   Enterobacteriaceae species DETECTED (A) NOT DETECTED Final    Comment: Enterobacteriaceae represent a large family of gram-negative bacteria, not a single organism. CRITICAL RESULT CALLED TO, READ BACK BY AND VERIFIED WITH: Karlene Einstein PharmD 15:05 12/15/18 ([email or web address])    Enterobacter cloacae complex NOT DETECTED NOT DETECTED Final   Escherichia coli DETECTED (A) NOT DETECTED Final    Comment: CRITICAL RESULT CALLED TO, READ BACK BY AND VERIFIED WITH: Karlene Einstein PharmD 15:05 12/15/18 ([email or web address])    Klebsiella oxytoca NOT DETECTED NOT DETECTED Final   Klebsiella pneumoniae NOT DETECTED NOT DETECTED Final   Proteus species NOT DETECTED NOT DETECTED Final   Serratia marcescens NOT DETECTED NOT DETECTED Final   Carbapenem resistance NOT DETECTED  NOT DETECTED Final   Haemophilus influenzae NOT DETECTED NOT DETECTED Final   Neisseria meningitidis NOT DETECTED NOT DETECTED Final   Pseudomonas aeruginosa NOT DETECTED NOT DETECTED Final   Candida albicans NOT DETECTED NOT DETECTED Final   Candida glabrata NOT DETECTED NOT DETECTED Final   Candida krusei NOT DETECTED NOT DETECTED Final   Candida parapsilosis NOT DETECTED NOT DETECTED Final   Candida tropicalis NOT DETECTED NOT DETECTED Final    Comment: Performed at [facility], [place] [address] [place], [place] [address]  Urine culture     Status: None   Collection Time: 12/17/18  2:08 AM  Result Value Ref Range Status   Specimen Description URINE, CLEAN CATCH  Final   Special Requests NONE  Final   Culture   Final    NO GROWTH Performed at [facility], [place] [address] [place], [place] [address]    Report Status 12/18/2018 FINAL  Final     Time coordinating discharge:  I have spent 35 minutes face to face with the patient and on the ward discussing the patients care, assessment, plan and disposition with other care givers. >50% of the time was devoted counseling the patient about the risks and benefits of treatment/Discharge disposition and coordinating care.   SIGNED:   Damita Lack, MD  [facility] 12/23/2018, 9:47 AM   If 7PM-7AM, please contact night-coverage [email or web address]

## 2018-12-23 NOTE — Consult Note (Signed)
   [facility] CM Inpatient Consult   12/23/2018  Justin [name] [birth year] [id number]    Follow-up note:  Spoke with Transition of care CM to check for further disposition needs of patient. Was informed that patient will discharge to home with daughter. Made her aware and agreed that [place] calls will benefit patient to follow-up his recovery at home.  Referral made for [facility] General calls after discharge.   For questions and additional information, please call:  Riel Hirschman A. Roran Wegner, BSN, RN-BC [facility] Liaison Cell: [phone]

## 2018-12-23 NOTE — TOC Transition Note (Signed)
Transition of Care [facility]) - CM/SW Discharge Note   Patient Details  Name: Aleks Nawrot MRN: [id number] Date of Birth: 08/28/1933  Transition of Care [facility]) CM/SW Contact:  Sharin Mons, RN Phone Number: 12/23/2018, 9:51 AM   Clinical Narrative: Presented with nausea vomiting and abdominal pain, increased urinary frequency, hypertension, hyperlipidemia, diabetes mellitus, GERD, depression, anxiety, CAD, CABG, diverticulitis, prostate cancer, UTI, CKD stage III. From home with daughter. PTA independent with ADL's. States no DME usage , however , owns walker.  Plan is to d/c to home with daughter today. Daughter to provide transportation to home. [place] to provide pt with Rx meds  @ bedside prior to discharge.  Jillyn Hidden (Daughter)     [phone]        Final next level of care: Home/Self Care Barriers to Discharge: Barriers Resolved   Patient Goals and CMS Choice Patient states their goals for this hospitalization and ongoing recovery are:: TO RELAX AND GET BACK ON [place] CMS Medicare.gov Compare Post Acute Care list provided to:: Patient    Discharge Plan and Services In-house Referral: NA Discharge Planning Services: CM Consult            DME Arranged: N/A(OWNS [facility] Rile) DME Agency: NA HH Arranged: NA HH Agency: NA   Social Determinants of Health (SDOH) Interventions     Readmission Risk Interventions No flowsheet data found.

## 2018-12-24 ENCOUNTER — Encounter (INDEPENDENT_AMBULATORY_CARE_PROVIDER_SITE_OTHER): Payer: Medicare PPO | Admitting: Ophthalmology

## 2018-12-31 DIAGNOSIS — N400 Enlarged prostate without lower urinary tract symptoms: Secondary | ICD-10-CM | POA: Diagnosis not present

## 2018-12-31 DIAGNOSIS — E782 Mixed hyperlipidemia: Secondary | ICD-10-CM | POA: Diagnosis not present

## 2018-12-31 DIAGNOSIS — I2510 Atherosclerotic heart disease of native coronary artery without angina pectoris: Secondary | ICD-10-CM | POA: Diagnosis not present

## 2018-12-31 DIAGNOSIS — R1013 Epigastric pain: Secondary | ICD-10-CM | POA: Diagnosis not present

## 2018-12-31 DIAGNOSIS — N183 Chronic kidney disease, stage 3 (moderate): Secondary | ICD-10-CM | POA: Diagnosis not present

## 2018-12-31 DIAGNOSIS — D509 Iron deficiency anemia, unspecified: Secondary | ICD-10-CM | POA: Diagnosis not present

## 2018-12-31 DIAGNOSIS — I10 Essential (primary) hypertension: Secondary | ICD-10-CM | POA: Diagnosis not present

## 2018-12-31 DIAGNOSIS — F419 Anxiety disorder, unspecified: Secondary | ICD-10-CM | POA: Diagnosis not present

## 2018-12-31 DIAGNOSIS — E118 Type 2 diabetes mellitus with unspecified complications: Secondary | ICD-10-CM | POA: Diagnosis not present

## 2019-01-11 ENCOUNTER — Other Ambulatory Visit: Payer: Self-pay

## 2019-01-11 MED ORDER — ISOSORBIDE MONONITRATE ER 60 MG PO TB24: 60.0000 mg | ORAL_TABLET | Freq: Every day | ORAL | 1 refills | Status: DC

## 2019-01-12 NOTE — Progress Notes (Signed)
[facility] Note  01/13/2019     CHIEF COMPLAINT Patient presents for Retina Follow Up   HISTORY OF PRESENT ILLNESS: Justin [name] is a 85 y.o. male who presents to the clinic today for:   HPI    Retina Follow Up    Patient presents with  Diabetic Retinopathy.  In both eyes.  This started 4 months ago.  Since onset it is stable.  I, the attending physician,  performed the HPI with the patient and updated documentation appropriately.          Comments    F/U NPDR OU. Patient states vision "good", denies flashes,floaters and ocular pain. BS 180 this am, Patient states BS have been "a little " high" since hospitalization (12/23/2018) for gallstones.        Last edited by Justin Caffey, MD on 01/13/2019 10:51 AM. (History)    pt states he missed his f/u appt bc he was in the hospital for gallstones and sepsis, he states he spent 12 days there, he states his vision is about the same, he states when he was in the hospital his vision seemed to be a little hazy   Referring physician: [name], MD [place], [place] [address]  HISTORICAL INFORMATION:   Selected notes from the [place] Referred by Dr. Frederico [name] for concern of mac edema OU LEE:  Ocular Hx-pseudo OU; formerly followed at [facility] Dr. Sharlyn [name]. S/p focal laser OS x2, history of IVK and IVT OU PMH-anxiety, arthritis, CAD, DM (taking lantus and januvia), HTN, high cholesterol, prostate cancer    CURRENT MEDICATIONS: No current outpatient medications on file. (Ophthalmic Drugs)   No current facility-administered medications for this visit.  (Ophthalmic Drugs)   Current Outpatient Medications (Other)  Medication Sig  . acetaminophen (TYLENOL) 500 MG tablet Take 500 mg by mouth daily as needed for mild pain.  Marland Kitchen AMITIZA 24 MCG capsule Take 24 mcg by mouth daily with breakfast.   . aspirin EC 81 MG tablet Take 81 mg by mouth  daily.  . chlorthalidone (HYGROTON) 25 MG tablet Take 25 mg by mouth daily.  . cholecalciferol (VITAMIN D3) 25 MCG (1000 UT) tablet Take 1,000 Units by mouth daily.  . cilostazol (PLETAL) 100 MG tablet Take 1 tablet (100 mg total) by mouth 2 (two) times daily.  . clonazePAM (KLONOPIN) 0.5 MG tablet Take 0.5 mg by mouth daily as needed for anxiety.  . DULoxetine HCl 40 MG CPEP Take 1 capsule by mouth 2 (two) times daily.  . isosorbide mononitrate (IMDUR) 60 MG 24 hr tablet Take 1 tablet (60 mg total) by mouth daily.  Marland Kitchen JANUVIA 100 MG tablet Take 100 mg by mouth daily.   Marland Kitchen LANTUS SOLOSTAR 100 UNIT/ML Solostar Pen Inject 20 Units into the skin 2 (two) times daily.   . metoprolol (LOPRESSOR) 50 MG tablet Take 50 mg by mouth 2 (two) times daily.  . nitroGLYCERIN (NITROSTAT) 0.4 MG SL tablet Place 1 tablet (0.4 mg total) under the tongue every 5 (five) minutes as needed for chest pain.  Marland Kitchen omeprazole (PRILOSEC) 20 MG capsule Take 20 mg by mouth daily.   . pregabalin (LYRICA) 50 MG capsule Take 50 mg by mouth 3 (three) times daily.  . simvastatin (ZOCOR) 20 MG tablet Take 20 mg by mouth daily.  . tamsulosin (FLOMAX) 0.4 MG CAPS capsule Take 0.4 mg by mouth daily.  . vitamin B-12 (CYANOCOBALAMIN) 1000  MCG tablet Take 1,000 mcg by mouth daily.   Current Facility-Administered Medications (Other)  Medication Route  . Bevacizumab (AVASTIN) SOLN 1.25 mg Intravitreal  . Bevacizumab (AVASTIN) SOLN 1.25 mg Intravitreal  . Bevacizumab (AVASTIN) SOLN 1.25 mg Intravitreal  . Bevacizumab (AVASTIN) SOLN 1.25 mg Intravitreal      REVIEW OF SYSTEMS: ROS    Positive for: Endocrine, Eyes   Negative for: Constitutional, Gastrointestinal, Neurological, Skin, Genitourinary, Musculoskeletal, HENT, Cardiovascular, Respiratory, Psychiatric, Allergic/Imm, Heme/Lymph   Last edited by Zenovia Jordan, LPN on 2/50/0370  4:88 AM. (History)       ALLERGIES Allergies  Allergen Reactions  . Dye Fdc Red [Red Dye]  Swelling and Other (See Comments)    CAT scan dye  . Ibuprofen Other (See Comments)    Upset stomach     PAST MEDICAL HISTORY Past Medical History:  Diagnosis Date  . Anxiety   . Arthritis   . Coronary artery disease   . Diabetes mellitus without complication ([place])   . Diverticulitis   . Dyspnea   . GERD (gastroesophageal reflux disease)   . Hypercholesteremia   . Hypertension   . Pneumonia    as a child  . Prostate cancer ([place])   . UTI (lower urinary tract infection)    Past Surgical History:  Procedure Laterality Date  . ABDOMINAL SURGERY     for diverticulitis; also removed appendix  . CATARACT EXTRACTION    . CATARACT EXTRACTION, BILATERAL    . CHOLECYSTECTOMY N/A 10/12/2017   Procedure: LAPAROSCOPIC CHOLECYSTECTOMY;  Surgeon: [name], MD;  Location: [facility];  Service: General;  Laterality: N/A;  . CORONARY ARTERY BYPASS GRAFT  2009  . ERCP N/A 12/21/2018   Procedure: ENDOSCOPIC RETROGRADE CHOLANGIOPANCREATOGRAPHY (ERCP);  Surgeon: [name], MD;  Location: [facility];  Service: Endoscopy;  Laterality: N/A;  . ESOPHAGOGASTRODUODENOSCOPY (EGD) WITH PROPOFOL N/A 12/17/2018   Procedure: ESOPHAGOGASTRODUODENOSCOPY (EGD) WITH PROPOFOL;  Surgeon: [name], MD;  Location: [facility];  Service: Endoscopy;  Laterality: N/A;  . EUS Left 12/17/2018   Procedure: UPPER ENDOSCOPIC ULTRASOUND (EUS) LINEAR;  Surgeon: [name], MD;  Location: [facility];  Service: Endoscopy;  Laterality: Left;  . EYE SURGERY     "for bleeding in eye"  . HERNIA REPAIR    . LEFT HEART CATH AND CORONARY ANGIOGRAPHY N/A 11/04/2016   Procedure: Left Heart Cath and Coronary Angiography;  Surgeon: [name], MD;  Location: [facility];  Service: Cardiovascular;  Laterality: N/A;  . LOWER EXTREMITY ANGIOGRAPHY N/A 11/04/2016   Procedure: Lower Extremity Angiography;  Surgeon: [name], MD;  Location: [facility];  Service: Cardiovascular;  Laterality: N/A;  . PROSTATE  SURGERY    . REMOVAL OF STONES  12/21/2018   Procedure: REMOVAL OF STONES;  Surgeon: [name], MD;  Location: [facility];  Service: Endoscopy;;  . Justin [name]  12/21/2018   Procedure: Justin [name];  Surgeon: [name], MD;  Location: [facility] ENDOSCOPY;  Service: Endoscopy;;    FAMILY HISTORY Family History  Problem Relation Age of Onset  . Diabetes Father   . Diabetes Maternal Aunt   . Diabetes Maternal Uncle   . Diabetes Maternal Grandmother   . Heart disease Sister   . Diabetes Brother   . Heart disease Sister     SOCIAL HISTORY Social History   Tobacco Use  . Smoking status: Former Research scientist (life sciences)  . Smokeless tobacco: Never Used  Substance Use Topics  . Alcohol use: No  . Drug use: No  OPHTHALMIC EXAM:  Base Eye Exam    Visual Acuity (Snellen - Linear)      Right Left   Dist cc 20/30 -1 20/30 -2   Dist ph cc NI NI   Correction:  Glasses       Tonometry (Tonopen, 8:34 AM)      Right Left   Pressure 15 16       Pupils      Dark Light Shape React APD   Right 3 2 Round Brisk None   Left 3 2 Round Brisk None       Visual Fields (Counting fingers)      Left Right    Full Full       Extraocular Movement      Right Left    Full, Ortho Full, Ortho       Neuro/Psych    Oriented x3:  Yes   Mood/Affect:  Normal       Dilation    Both eyes:  1.0% Mydriacyl, 2.5% Phenylephrine @ 8:31 AM        Slit Lamp and Fundus Exam    Slit Lamp Exam      Right Left   Lids/Lashes Dermatochalasis - upper lid, Meibomian gland dysfunction Dermatochalasis - upper lid, Meibomian gland dysfunction   Conjunctiva/Sclera Melanosis Melanosis   Cornea Arcus, 1+ Punctate epithelial erosions Arcus, 1+ Punctate epithelial erosions   Anterior Chamber Deep and quiet Deep and quiet   Iris Mild Temporal Iris atrophy, Round and dilated, No NVI Mild Temporal Iris atrophy, Round and dilated, No NVI   Lens PC IOL in good position with mild aterior capsule fimosis PC IOL in  good position with mild aterior capsule fimosis   Vitreous Vitreous syneresis, Posterior vitreous detachment Vitreous syneresis, refractile deposits in posterior hyaloid, mild Asteroid hyalosis       Fundus Exam      Right Left   Disc Pink and Sharp, Peripapillary atrophy pink and shap, Tilted disc, mild temporal Temporal Peripapillary atrophy   C/D Ratio 0.3 0.3   Macula Blunted foveal reflex, Retinal pigment epithelial mottling and clumping, Microaneurysms -- improved, persistent cystic changes  Blunted foveal reflex, focal area of RPE atrophy SN to fovea, Microaneurysms, Retinal pigment epithelial mottling, refractile Epiretinal membrane, persistent cystic changes   Vessels Tortuous, AV crossing changes Tortuous, Vascular attenuation   Periphery Attached, scattered MA Attached, flame heme at 1100 midzone          IMAGING AND PROCEDURES  Imaging and Procedures for _0 @  OCT, Retina - OU - Both Eyes       Right Eye Quality was good. Central Foveal Thickness: 405. Progression has been stable. Findings include abnormal foveal contour, intraretinal fluid, no SRF, retinal drusen , outer retinal atrophy (Persistent IRF).   Left Eye Quality was good. Central Foveal Thickness: 278. Progression has been stable. Findings include abnormal foveal contour, no SRF, intraretinal fluid, outer retinal atrophy, retinal drusen  (Persist IRF/cystic changes).   Notes *Images captured and stored on drive  Diagnosis / Impression:  DME OU, OD>OS OD - persistent IRF OS - persistent IRF; focal areas of outer retinal atrophy -- stable from prior  Clinical management:  See below  Abbreviations: NFP - Normal foveal profile. CME - cystoid macular edema. PED - pigment epithelial detachment. IRF - intraretinal fluid. SRF - subretinal fluid. EZ - ellipsoid zone. ERM - epiretinal membrane. ORA - outer retinal atrophy. ORT - outer retinal tubulation. SRHM - subretinal hyper-reflective material  Intravitreal Injection, Pharmacologic Agent - OD - Right Eye       Time Out 01/13/2019. 9:19 AM. Confirmed correct patient, procedure, site, and patient consented.   Anesthesia Topical anesthesia was used. Anesthetic medications included Lidocaine 2%, Proparacaine 0.5%.   Procedure Preparation included 5% betadine to ocular surface, eyelid speculum. A supplied needle was used.   Injection:  1.25 mg Bevacizumab (AVASTIN) SOLN   NDC: 70350-093-81, Lot: [id number] , Expiration date: 02/16/2019   Route: Intravitreal, Site: Right Eye, Waste: 0 mL  Post-op Post injection exam found visual acuity of at least counting fingers. The patient tolerated the procedure well. There were no complications. The patient received written and verbal post procedure care education.        Intravitreal Injection, Pharmacologic Agent - OS - Left Eye       Time Out 01/13/2019. 9:19 AM. Confirmed correct patient, procedure, site, and patient consented.   Anesthesia Topical anesthesia was used. Anesthetic medications included Lidocaine 2%, Proparacaine 0.5%.   Procedure Preparation included 5% betadine to ocular surface, eyelid speculum. A 30 gauge needle was used.   Injection:  1.25 mg Bevacizumab (AVASTIN) SOLN   NDC: 82993-716-96, Lot: [id number] , Expiration date: 03/11/2019   Route: Intravitreal, Site: Left Eye, Waste: 0 mL  Post-op Post injection exam found visual acuity of at least counting fingers. The patient tolerated the procedure well. There were no complications. The patient received written and verbal post procedure care education.                 ASSESSMENT/PLAN:    ICD-10-CM   1. Moderate nonproliferative diabetic retinopathy of both eyes with macular edema associated with type 2 diabetes mellitus (HCC) V89.3810 Intravitreal Injection, Pharmacologic Agent - OD - Right Eye    Intravitreal Injection, Pharmacologic Agent - OS - Left Eye    Bevacizumab (AVASTIN) SOLN 1.25 mg     Bevacizumab (AVASTIN) SOLN 1.25 mg  2. Retinal edema H35.81 OCT, Retina - OU - Both Eyes  3. Essential hypertension I10   4. Hypertensive retinopathy of both eyes H35.033   5. Pseudophakia of both eyes Z96.1     1,2. Moderate non-proliferative diabetic retinopathy with edema OU - moved to [place] from [place], [place] -- history of prior injections OS with Dr. Sharlyn [name] - records received and reviewed from [facility] (Dr. Sharlyn [name]) - history of focal laser OS x2 and IVK/IVT OU in [place] -- last injection was IVK OS November 2013 - exam scattered IRH, DBH and +macular edema - OCT shows diabetic macular edema, both eyes, (OD > OS) - S/P IVA OD #1 (09.17.19), #2 (10.29.19), #3 (11.26.19), #4 (01.02.20), #5 (01.30.20), #6 (02.28.20) - S/P IVA OS #1 (10.01.19), #2 (10.29.19), #3 (11.26.19), #4 (01.02.20), #5 (01.30.20), #6 (02.28.20) - FA (10.1.19) showed late leaking MA OU -- no NV - today, delayed f/u due to hospitalization for sepsis / gallstones - OCT shows persistent IRF OS - BCVA stable at 20/30 OU - recommend IVA OU #7 today (04.16.20) - pt wishes to proceed - RBA of procedure discussed, questions answered - informed consent obtained and signed - see procedure note - [employer] benefits investigation initiated 1.31.2020 - f/u in 4-5 weeks, DFE, OCT, possible injection  3,4. Hypertensive retinopathy OU - discussed importance of tight BP control - monitor  5. Pseudophakia OU  - s/p CE/IOL OU in [place], [place]  - beautiful surgeries, doing well  - monitor   Ophthalmic Meds Ordered this visit:  Meds ordered this encounter  Medications  .  Bevacizumab (AVASTIN) SOLN 1.25 mg  . Bevacizumab (AVASTIN) SOLN 1.25 mg       Return for f/u 4-5 weeks, NPDR OU, DFE, OCT.  There are no Patient Instructions on file for this visit.   Explained the diagnoses, plan, and follow up with the patient and they expressed understanding.  Patient expressed understanding of the  importance of proper follow up care.   This document serves as a record of services personally performed by Gardiner Sleeper, MD, PhD. It was created on their behalf by Ernest Mallick, OA, an ophthalmic assistant. The creation of this record is the provider's dictation and/or activities during the visit.    Electronically signed by: Ernest Mallick, OA  04.15.2020 11:04 AM    Gardiner Sleeper, M.D., Ph.D. Diseases & Surgery of the Retina and Vitreous [facility]  I have reviewed the above documentation for accuracy and completeness, and I agree with the above. Gardiner Sleeper, M.D., Ph.D. 01/13/19 11:04 AM    Abbreviations: M myopia (nearsighted); A astigmatism; H hyperopia (farsighted); P presbyopia; Mrx spectacle prescription;  CTL contact lenses; OD right eye; OS left eye; OU both eyes  XT exotropia; ET esotropia; PEK punctate epithelial keratitis; PEE punctate epithelial erosions; DES dry eye syndrome; MGD meibomian gland dysfunction; ATs artificial tears; PFAT's preservative free artificial tears; [place] nuclear sclerotic cataract; PSC posterior subcapsular cataract; ERM epi-retinal membrane; PVD posterior vitreous detachment; RD retinal detachment; DM diabetes mellitus; DR diabetic retinopathy; NPDR non-proliferative diabetic retinopathy; PDR proliferative diabetic retinopathy; CSME clinically significant macular edema; DME diabetic macular edema; dbh dot blot hemorrhages; CWS cotton wool spot; POAG primary open angle glaucoma; C/D cup-to-disc ratio; HVF humphrey visual field; GVF goldmann visual field; OCT optical coherence tomography; IOP intraocular pressure; BRVO Branch retinal vein occlusion; CRVO central retinal vein occlusion; CRAO central retinal artery occlusion; BRAO branch retinal artery occlusion; RT retinal tear; SB scleral buckle; PPV pars plana vitrectomy; VH Vitreous hemorrhage; PRP panretinal laser photocoagulation; IVK intravitreal kenalog; VMT vitreomacular  traction; MH Macular hole;  NVD neovascularization of the disc; NVE neovascularization elsewhere; AREDS age related eye disease study; ARMD age related macular degeneration; POAG primary open angle glaucoma; EBMD epithelial/anterior basement membrane dystrophy; ACIOL anterior chamber intraocular lens; IOL intraocular lens; PCIOL posterior chamber intraocular lens; Phaco/IOL phacoemulsification with intraocular lens placement; [place] photorefractive keratectomy; LASIK laser assisted in situ keratomileusis; HTN hypertension; DM diabetes mellitus; COPD chronic obstructive pulmonary disease

## 2019-01-13 ENCOUNTER — Ambulatory Visit (INDEPENDENT_AMBULATORY_CARE_PROVIDER_SITE_OTHER): Payer: Medicare PPO | Admitting: Ophthalmology

## 2019-01-13 ENCOUNTER — Encounter (INDEPENDENT_AMBULATORY_CARE_PROVIDER_SITE_OTHER): Payer: Self-pay | Admitting: Ophthalmology

## 2019-01-13 ENCOUNTER — Other Ambulatory Visit: Payer: Self-pay

## 2019-01-13 DIAGNOSIS — H35033 Hypertensive retinopathy, bilateral: Secondary | ICD-10-CM | POA: Diagnosis not present

## 2019-01-13 DIAGNOSIS — Z961 Presence of intraocular lens: Secondary | ICD-10-CM | POA: Diagnosis not present

## 2019-01-13 DIAGNOSIS — E113313 Type 2 diabetes mellitus with moderate nonproliferative diabetic retinopathy with macular edema, bilateral: Secondary | ICD-10-CM | POA: Diagnosis not present

## 2019-01-13 DIAGNOSIS — H3581 Retinal edema: Secondary | ICD-10-CM

## 2019-01-13 DIAGNOSIS — I10 Essential (primary) hypertension: Secondary | ICD-10-CM

## 2019-01-13 MED ORDER — BEVACIZUMAB CHEMO INJECTION 1.25MG/0.05ML SYRINGE FOR KALEIDOSCOPE
1.2500 mg | INTRAVITREAL | Status: AC | PRN
  Administered 2019-01-13: 1.25 mg via INTRAVITREAL

## 2019-01-14 ENCOUNTER — Encounter (INDEPENDENT_AMBULATORY_CARE_PROVIDER_SITE_OTHER): Payer: Medicare PPO | Admitting: Ophthalmology

## 2019-01-24 DIAGNOSIS — K8050 Calculus of bile duct without cholangitis or cholecystitis without obstruction: Secondary | ICD-10-CM | POA: Diagnosis not present

## 2019-02-02 DIAGNOSIS — N183 Chronic kidney disease, stage 3 (moderate): Secondary | ICD-10-CM | POA: Diagnosis not present

## 2019-02-02 DIAGNOSIS — R1013 Epigastric pain: Secondary | ICD-10-CM | POA: Diagnosis not present

## 2019-02-02 DIAGNOSIS — N400 Enlarged prostate without lower urinary tract symptoms: Secondary | ICD-10-CM | POA: Diagnosis not present

## 2019-02-02 DIAGNOSIS — E118 Type 2 diabetes mellitus with unspecified complications: Secondary | ICD-10-CM | POA: Diagnosis not present

## 2019-02-02 DIAGNOSIS — I10 Essential (primary) hypertension: Secondary | ICD-10-CM | POA: Diagnosis not present

## 2019-02-02 DIAGNOSIS — I2510 Atherosclerotic heart disease of native coronary artery without angina pectoris: Secondary | ICD-10-CM | POA: Diagnosis not present

## 2019-02-02 DIAGNOSIS — E782 Mixed hyperlipidemia: Secondary | ICD-10-CM | POA: Diagnosis not present

## 2019-02-02 DIAGNOSIS — F419 Anxiety disorder, unspecified: Secondary | ICD-10-CM | POA: Diagnosis not present

## 2019-02-02 DIAGNOSIS — D509 Iron deficiency anemia, unspecified: Secondary | ICD-10-CM | POA: Diagnosis not present

## 2019-02-09 NOTE — Progress Notes (Signed)
[facility] Note  02/10/2019     CHIEF COMPLAINT Patient presents for Retina Follow Up   HISTORY OF PRESENT ILLNESS: Justin [name] is a 85 y.o. male who presents to the clinic today for:   HPI    Retina Follow Up    Patient presents with  Diabetic Retinopathy.  In both eyes.  Severity is moderate.  Duration of 4 weeks.  I, the attending physician,  performed the HPI with the patient and updated documentation appropriately.          Comments    Patient states vision the same OU.BS was 138 this am. Last a1c was just over 8.        Last edited by [name], MD on 02/10/2019  9:55 AM. (History)    pt states his insurance has not approved Eylea, he states he feels like he had the same problem with Dr. [name] in [place]   Referring physician: [name], MD [place], [place] [address]  HISTORICAL INFORMATION:   Selected notes from the [facility] Referred by Dr. [name] for concern of mac edema OU LEE:  Ocular Hx-pseudo OU; formerly followed at [facility] Dr. [name]. S/p focal laser OS x2, history of IVK and IVT OU PMH-anxiety, arthritis, CAD, DM (taking lantus and januvia), HTN, high cholesterol, prostate cancer    CURRENT MEDICATIONS: No current outpatient medications on file. (Ophthalmic Drugs)   No current facility-administered medications for this visit.  (Ophthalmic Drugs)   Current Outpatient Medications (Other)  Medication Sig  . acetaminophen (TYLENOL) 500 MG tablet Take 500 mg by mouth daily as needed for mild pain.  Marland Kitchen AMITIZA 24 MCG capsule Take 24 mcg by mouth daily with breakfast.   . aspirin EC 81 MG tablet Take 81 mg by mouth daily.  . chlorthalidone (HYGROTON) 25 MG tablet Take 25 mg by mouth daily.  . cholecalciferol (VITAMIN D3) 25 MCG (1000 UT) tablet Take 1,000 Units by mouth daily.  . cilostazol (PLETAL) 100 MG tablet Take 1 tablet (100 mg total) by mouth 2  (two) times daily.  . clonazePAM (KLONOPIN) 0.5 MG tablet Take 0.5 mg by mouth daily as needed for anxiety.  . DULoxetine HCl 40 MG CPEP Take 1 capsule by mouth 2 (two) times daily.  . isosorbide mononitrate (IMDUR) 60 MG 24 hr tablet Take 1 tablet (60 mg total) by mouth daily.  Marland Kitchen JANUVIA 100 MG tablet Take 100 mg by mouth daily.   Marland Kitchen LANTUS SOLOSTAR 100 UNIT/ML Solostar Pen Inject 20 Units into the skin 2 (two) times daily.   . metoprolol (LOPRESSOR) 50 MG tablet Take 50 mg by mouth 2 (two) times daily.  . nitroGLYCERIN (NITROSTAT) 0.4 MG SL tablet Place 1 tablet (0.4 mg total) under the tongue every 5 (five) minutes as needed for chest pain.  Marland Kitchen omeprazole (PRILOSEC) 20 MG capsule Take 20 mg by mouth daily.   . pregabalin (LYRICA) 50 MG capsule Take 50 mg by mouth 3 (three) times daily.  . simvastatin (ZOCOR) 20 MG tablet Take 20 mg by mouth daily.  . tamsulosin (FLOMAX) 0.4 MG CAPS capsule Take 0.4 mg by mouth daily.  . vitamin B-12 (CYANOCOBALAMIN) 1000 MCG tablet Take 1,000 mcg by mouth daily.   Current Facility-Administered Medications (Other)  Medication Route  . Bevacizumab (AVASTIN) SOLN 1.25 mg Intravitreal  . Bevacizumab (AVASTIN) SOLN 1.25 mg Intravitreal  . Bevacizumab (AVASTIN) SOLN 1.25 mg Intravitreal  .  Bevacizumab (AVASTIN) SOLN 1.25 mg Intravitreal      REVIEW OF SYSTEMS: ROS    Positive for: Endocrine, Eyes, Psychiatric   Negative for: Constitutional, Gastrointestinal, Neurological, Skin, Genitourinary, Musculoskeletal, HENT, Cardiovascular, Respiratory, Allergic/Imm, Heme/Lymph   Last edited by Roselee Nova D on 02/10/2019  9:20 AM. (History)       ALLERGIES Allergies  Allergen Reactions  . Dye Fdc Red [Red Dye] Swelling and Other (See Comments)    CAT scan dye  . Ibuprofen Other (See Comments)    Upset stomach     PAST MEDICAL HISTORY Past Medical History:  Diagnosis Date  . Anxiety   . Arthritis   . Coronary artery disease   . Diabetes mellitus  without complication ([place])   . Diverticulitis   . Dyspnea   . GERD (gastroesophageal reflux disease)   . Hypercholesteremia   . Hypertension   . Pneumonia    as a child  . Prostate cancer ([place])   . UTI (lower urinary tract infection)    Past Surgical History:  Procedure Laterality Date  . ABDOMINAL SURGERY     for diverticulitis; also removed appendix  . CATARACT EXTRACTION    . CATARACT EXTRACTION, BILATERAL    . CHOLECYSTECTOMY N/A 10/12/2017   Procedure: LAPAROSCOPIC CHOLECYSTECTOMY;  Surgeon: [name], MD;  Location: [facility];  Service: General;  Laterality: N/A;  . CORONARY ARTERY BYPASS GRAFT  2009  . ERCP N/A 12/21/2018   Procedure: ENDOSCOPIC RETROGRADE CHOLANGIOPANCREATOGRAPHY (ERCP);  Surgeon: [name], MD;  Location: [place];  Service: Endoscopy;  Laterality: N/A;  . ESOPHAGOGASTRODUODENOSCOPY (EGD) WITH PROPOFOL N/A 12/17/2018   Procedure: ESOPHAGOGASTRODUODENOSCOPY (EGD) WITH PROPOFOL;  Surgeon: [name], MD;  Location: [facility];  Service: Endoscopy;  Laterality: N/A;  . EUS Left 12/17/2018   Procedure: UPPER ENDOSCOPIC ULTRASOUND (EUS) LINEAR;  Surgeon: [name], MD;  Location: [place];  Service: Endoscopy;  Laterality: Left;  . EYE SURGERY     "for bleeding in eye"  . HERNIA REPAIR    . LEFT HEART CATH AND CORONARY ANGIOGRAPHY N/A 11/04/2016   Procedure: Left Heart Cath and Coronary Angiography;  Surgeon: [name], MD;  Location: [facility];  Service: Cardiovascular;  Laterality: N/A;  . LOWER EXTREMITY ANGIOGRAPHY N/A 11/04/2016   Procedure: Lower Extremity Angiography;  Surgeon: [name], MD;  Location: [place] [facility];  Service: Cardiovascular;  Laterality: N/A;  . PROSTATE SURGERY    . REMOVAL OF STONES  12/21/2018   Procedure: REMOVAL OF STONES;  Surgeon: [name], MD;  Location: [place];  Service: Endoscopy;;  . [name]  12/21/2018   Procedure: [name];  Surgeon: [name], MD;  Location: [facility]  ENDOSCOPY;  Service: Endoscopy;;    FAMILY HISTORY Family History  Problem Relation Age of Onset  . Diabetes Father   . Diabetes Maternal Aunt   . Diabetes Maternal Uncle   . Diabetes Maternal Grandmother   . Heart disease Sister   . Diabetes Brother   . Heart disease Sister     SOCIAL HISTORY Social History   Tobacco Use  . Smoking status: Former Research scientist (life sciences)  . Smokeless tobacco: Never Used  Substance Use Topics  . Alcohol use: No  . Drug use: No         OPHTHALMIC EXAM:  Base Eye Exam    Visual Acuity (Snellen - Linear)      Right Left   Dist cc 20/30 +1 20/30 -1   Dist ph cc NI 20/30 +2  Correction:  Glasses       Tonometry (Tonopen, 9:31 AM)      Right Left   Pressure 12 14       Pupils      Dark Light Shape React APD   Right 3 2 Round Brisk None   Left 3 2 Round Brisk None       Visual Fields (Counting fingers)      Left Right    Full Full       Extraocular Movement      Right Left    Full, Ortho Full, Ortho       Neuro/Psych    Oriented x3:  Yes   Mood/Affect:  Normal       Dilation    Both eyes:  1.0% Mydriacyl, 2.5% Phenylephrine @ 9:31 AM        Slit Lamp and Fundus Exam    Slit Lamp Exam      Right Left   Lids/Lashes Dermatochalasis - upper lid, Meibomian gland dysfunction Dermatochalasis - upper lid, Meibomian gland dysfunction   Conjunctiva/Sclera Melanosis Melanosis   Cornea Arcus, 1+ Punctate epithelial erosions Arcus, 1+ Punctate epithelial erosions   Anterior Chamber Deep and quiet Deep and quiet   Iris Mild Temporal Iris atrophy, Round and dilated, No NVI Mild Temporal Iris atrophy, Round and dilated, No NVI   Lens PC IOL in good position with mild aterior capsule fimosis PC IOL in good position with mild aterior capsule fimosis   Vitreous Vitreous syneresis, Posterior vitreous detachment Vitreous syneresis, refractile deposits in posterior hyaloid, mild Asteroid hyalosis       Fundus Exam      Right Left   Disc Pink and  Sharp, Peripapillary atrophy pink and shap, Tilted disc, mild temporal Temporal Peripapillary atrophy   C/D Ratio 0.2 0.3   Macula Blunted foveal reflex, Retinal pigment epithelial mottling and clumping, Microaneurysms -- improved, persistent cystic changes  Blunted foveal reflex, focal area of RPE atrophy SN to fovea, Microaneurysms, Retinal pigment epithelial mottling, refractile Epiretinal membrane, persistent cystic changes, slightly improved   Vessels Vascular attenuation, Tortuous, AV crossing changes Tortuous, Vascular attenuation   Periphery Attached, scattered MA Attached, scattered IRH        Refraction    Wearing Rx      Sphere Cylinder Axis Add   Right +0.25 +0.50 010 +3.00   Left [place] +0.75 173 +3.00   Type:  Bifocal          IMAGING AND PROCEDURES  Imaging and Procedures for _0 @  OCT, Retina - OU - Both Eyes       Right Eye Quality was good. Central Foveal Thickness: 414. Progression has been stable. Findings include abnormal foveal contour, intraretinal fluid, no SRF, retinal drusen , outer retinal atrophy (Persistent IRF).   Left Eye Quality was good. Central Foveal Thickness: 268. Progression has been stable. Findings include abnormal foveal contour, no SRF, intraretinal fluid, outer retinal atrophy, retinal drusen  (Persist IRF/cystic changes, ?mild interval decrease).   Notes *Images captured and stored on drive  Diagnosis / Impression:  DME OU, OD>OS OD - persistent IRF OS - persistent IRF; focal areas of outer retinal atrophy -- stable from prior  Clinical management:  See below  Abbreviations: NFP - Normal foveal profile. CME - cystoid macular edema. PED - pigment epithelial detachment. IRF - intraretinal fluid. SRF - subretinal fluid. EZ - ellipsoid zone. ERM - epiretinal membrane. ORA - outer retinal atrophy. ORT - outer retinal tubulation.  SRHM - subretinal hyper-reflective material         Intravitreal Injection, Pharmacologic Agent - OD  - Right Eye       Time Out 02/10/2019. 10:43 AM. Confirmed correct patient, procedure, site, and patient consented.   Anesthesia Topical anesthesia was used. Anesthetic medications included Lidocaine 2%, Proparacaine 0.5%.   Procedure Preparation included 5% betadine to ocular surface, eyelid speculum. A 27 gauge needle was used.   Injection:  4 mg KENALOG-35m/ml injection 42m  NDC: 004854-6270-35Lot: : [id number] date: 08/29/2019   Route: Intravitreal, Site: Right Eye  Post-op Post injection exam found visual acuity of at least counting fingers. The patient tolerated the procedure well. There were no complications. The patient received written and verbal post procedure care education.   Notes 0.1 cc of Kenalog-40 injected into vitreous cavity ~3.5 mm posterior to limbus -- inferotemporal quadrant.  An AC tap was performed following injection due to elevated IOP using a 30 gauge needle on a syringe with the plunger removed. The needle was placed at the limbus at 7 oclock and approximately 0.05cc of aqueous was removed from the anterior chamber. Betadine was applied to the tap area before and after the paracentesis was performed. There were no complications. The patient tolerated the procedure well. The IOP was rechecked and was found to be 8 mmHg by tonopen.        Intravitreal Injection, Pharmacologic Agent - OS - Left Eye       Time Out 02/10/2019. 10:41 AM. Confirmed correct patient, procedure, site, and patient consented.   Anesthesia Topical anesthesia was used. Anesthetic medications included Lidocaine 2%, Proparacaine 0.5%.   Procedure Preparation included 5% betadine to ocular surface, eyelid speculum. A 30 gauge needle was used.   Injection:  1.25 mg Bevacizumab (AVASTIN) SOLN   NDC: 5029937-169-67Lot: [id number]>_4<\/[id number]>_3 Expiration date: 03/11/2019   Route: Intravitreal, Site: Left Eye, Waste: 0 mL  Post-op Post injection exam found visual acuity of at  least counting fingers. The patient tolerated the procedure well. There were no complications. The patient received written and verbal post procedure care education.                 ASSESSMENT/PLAN:    ICD-10-CM   1. Moderate nonproliferative diabetic retinopathy of both eyes with macular edema associated with type 2 diabetes mellitus (HCC) E1O67.1245ntravitreal Injection, Pharmacologic Agent - OD - Right Eye    Intravitreal Injection, Pharmacologic Agent - OS - Left Eye    triamcinolone acetonide (KENALOG-40) injection 4 mg    Bevacizumab (AVASTIN) SOLN 1.25 mg  2. Retinal edema H35.81 OCT, Retina - OU - Both Eyes  3. Essential hypertension I10   4. Hypertensive retinopathy of both eyes H35.033   5. Pseudophakia of both eyes Z96.1     1,2. Moderate non-proliferative diabetic retinopathy with edema OU   [place] history of prior injections OS with Dr. O'[name]- [name] - history of focal laser OS x2 and [place] injection was IVK OS November 2013  - exam [place] and +macular edema  - OCT shows diabetic macular edema, both eyes, (OD > OS)  - S/P IVA OD #1 (09.17.19), #2 (10.29.19), #3 (11.26.19), #4 (01.02.20), #5 (01.30.20), #6 (02.28.20), #7 (04.16.20)  - S/P IVA OS #1 (10.01.19), #2 (10.29.19), #3 (11.26.19), #4 (01.02.20), #5 (01.30.20), #6 (02.28.20), #7 (04.16.20)  -  FA (10.1.19) showed late leaking MA OU -- no NV  - delayed f/u on 4.16.20 due to hospitalization for sepsis / gallstones  - OCT shows persistent IRF OU -- ?resistance to IVA OD  - BCVA stable at 20/30 OU  - recommend switching to kenalog OD -- due to persistent IRF --  IVA #8 OS today, 05.14.20  - pt wishes to proceed  - RBA and potential side effects of kenalog and procedures discussed, questions answered  - informed consent obtained and signed  - see procedure notes  - [email or web address]  benefits investigation initiated 1.31.2020 -- not approved  - f/u in 4 weeks, DFE, OCT, possible injection  3,4. Hypertensive retinopathy OU  - discussed importance of tight BP control  - monitor  5. Pseudophakia OU  - s/p CE/IOL OU in [place], [place]  - beautiful surgeries, doing well  - monitor   Ophthalmic Meds Ordered this visit:  Meds ordered this encounter  Medications  . triamcinolone acetonide (KENALOG-40) injection 4 mg  . Bevacizumab (AVASTIN) SOLN 1.25 mg       Return in about 4 weeks (around 03/10/2019) for f/u NPDR OU, DFE, OCT.  There are no Patient Instructions on file for this visit.   Explained the diagnoses, plan, and follow up with the patient and they expressed understanding.  Patient expressed understanding of the importance of proper follow up care.   This document serves as a record of services personally performed by Gardiner Sleeper, MD, PhD. It was created on their behalf by Ernest Mallick, OA, an ophthalmic assistant. The creation of this record is the provider's dictation and/or activities during the visit.    Electronically signed by: Ernest Mallick, OA  05.13.2020 10:57 AM    Gardiner Sleeper, M.D., Ph.D. Diseases & Surgery of the Retina and Vitreous [facility]  I have reviewed the above documentation for accuracy and completeness, and I agree with the above. Gardiner Sleeper, M.D., Ph.D. 02/10/19 10:59 AM     Abbreviations: M myopia (nearsighted); A astigmatism; H hyperopia (farsighted); P presbyopia; Mrx spectacle prescription;  CTL contact lenses; OD right eye; OS left eye; OU both eyes  XT exotropia; ET esotropia; PEK punctate epithelial keratitis; PEE punctate epithelial erosions; DES dry eye syndrome; MGD meibomian gland dysfunction; ATs artificial tears; PFAT's preservative free artificial tears; [place] nuclear sclerotic cataract; PSC posterior subcapsular cataract; ERM epi-retinal membrane; PVD posterior vitreous detachment;  RD retinal detachment; DM diabetes mellitus; DR diabetic retinopathy; NPDR non-proliferative diabetic retinopathy; PDR proliferative diabetic retinopathy; CSME clinically significant macular edema; DME diabetic macular edema; dbh dot blot hemorrhages; CWS cotton wool spot; POAG primary open angle glaucoma; C/D cup-to-disc ratio; HVF humphrey visual field; GVF goldmann visual field; OCT optical coherence tomography; IOP intraocular pressure; BRVO Branch retinal vein occlusion; CRVO central retinal vein occlusion; CRAO central retinal artery occlusion; BRAO branch retinal artery occlusion; RT retinal tear; SB scleral buckle; PPV pars plana vitrectomy; VH Vitreous hemorrhage; PRP panretinal laser photocoagulation; IVK intravitreal kenalog; VMT vitreomacular traction; MH Macular hole;  NVD neovascularization of the disc; NVE neovascularization elsewhere; AREDS age related eye disease study; ARMD age related macular degeneration; POAG primary open angle glaucoma; EBMD epithelial/anterior basement membrane dystrophy; ACIOL anterior chamber intraocular lens; IOL intraocular lens; PCIOL posterior chamber intraocular lens; Phaco/IOL phacoemulsification with intraocular lens placement; [place] photorefractive keratectomy; LASIK laser assisted in situ keratomileusis; HTN hypertension; DM diabetes mellitus; COPD chronic obstructive pulmonary disease

## 2019-02-10 ENCOUNTER — Other Ambulatory Visit: Payer: Self-pay

## 2019-02-10 ENCOUNTER — Encounter (INDEPENDENT_AMBULATORY_CARE_PROVIDER_SITE_OTHER): Payer: Self-pay | Admitting: Ophthalmology

## 2019-02-10 ENCOUNTER — Ambulatory Visit (INDEPENDENT_AMBULATORY_CARE_PROVIDER_SITE_OTHER): Payer: Medicare PPO | Admitting: Ophthalmology

## 2019-02-10 DIAGNOSIS — Z961 Presence of intraocular lens: Secondary | ICD-10-CM | POA: Diagnosis not present

## 2019-02-10 DIAGNOSIS — E113313 Type 2 diabetes mellitus with moderate nonproliferative diabetic retinopathy with macular edema, bilateral: Secondary | ICD-10-CM

## 2019-02-10 DIAGNOSIS — I10 Essential (primary) hypertension: Secondary | ICD-10-CM

## 2019-02-10 DIAGNOSIS — H35033 Hypertensive retinopathy, bilateral: Secondary | ICD-10-CM | POA: Diagnosis not present

## 2019-02-10 DIAGNOSIS — H3581 Retinal edema: Secondary | ICD-10-CM | POA: Diagnosis not present

## 2019-02-10 MED ORDER — BEVACIZUMAB CHEMO INJECTION 1.25MG/0.05ML SYRINGE FOR KALEIDOSCOPE
1.2500 mg | INTRAVITREAL | Status: AC | PRN
  Administered 2019-02-10: 1.25 mg via INTRAVITREAL

## 2019-02-10 MED ORDER — TRIAMCINOLONE ACETONIDE 40 MG/ML IJ SUSP FOR KALEIDOSCOPE
4.0000 mg | INTRAMUSCULAR | Status: AC | PRN
  Administered 2019-02-10: 4 mg via INTRAVITREAL

## 2019-02-14 ENCOUNTER — Telehealth (INDEPENDENT_AMBULATORY_CARE_PROVIDER_SITE_OTHER): Payer: Self-pay

## 2019-02-16 DIAGNOSIS — N400 Enlarged prostate without lower urinary tract symptoms: Secondary | ICD-10-CM | POA: Diagnosis not present

## 2019-02-16 DIAGNOSIS — N183 Chronic kidney disease, stage 3 (moderate): Secondary | ICD-10-CM | POA: Diagnosis not present

## 2019-02-16 DIAGNOSIS — E118 Type 2 diabetes mellitus with unspecified complications: Secondary | ICD-10-CM | POA: Diagnosis not present

## 2019-02-16 DIAGNOSIS — I2510 Atherosclerotic heart disease of native coronary artery without angina pectoris: Secondary | ICD-10-CM | POA: Diagnosis not present

## 2019-02-16 DIAGNOSIS — F419 Anxiety disorder, unspecified: Secondary | ICD-10-CM | POA: Diagnosis not present

## 2019-02-16 DIAGNOSIS — R1013 Epigastric pain: Secondary | ICD-10-CM | POA: Diagnosis not present

## 2019-02-16 DIAGNOSIS — D509 Iron deficiency anemia, unspecified: Secondary | ICD-10-CM | POA: Diagnosis not present

## 2019-02-16 DIAGNOSIS — E782 Mixed hyperlipidemia: Secondary | ICD-10-CM | POA: Diagnosis not present

## 2019-02-16 DIAGNOSIS — I10 Essential (primary) hypertension: Secondary | ICD-10-CM | POA: Diagnosis not present

## 2019-03-08 ENCOUNTER — Other Ambulatory Visit: Payer: Self-pay

## 2019-03-08 ENCOUNTER — Ambulatory Visit (INDEPENDENT_AMBULATORY_CARE_PROVIDER_SITE_OTHER): Payer: Medicare PPO | Admitting: Cardiology

## 2019-03-08 ENCOUNTER — Encounter: Payer: Self-pay | Admitting: Cardiology

## 2019-03-08 VITALS — BP 150/80 | HR 62 | Ht 67.0 in | Wt 170.0 lb

## 2019-03-08 DIAGNOSIS — N183 Chronic kidney disease, stage 3 unspecified: Secondary | ICD-10-CM

## 2019-03-08 DIAGNOSIS — I6521 Occlusion and stenosis of right carotid artery: Secondary | ICD-10-CM | POA: Diagnosis not present

## 2019-03-08 DIAGNOSIS — Z951 Presence of aortocoronary bypass graft: Secondary | ICD-10-CM

## 2019-03-08 DIAGNOSIS — I739 Peripheral vascular disease, unspecified: Secondary | ICD-10-CM

## 2019-03-08 DIAGNOSIS — I951 Orthostatic hypotension: Secondary | ICD-10-CM

## 2019-03-08 DIAGNOSIS — E1122 Type 2 diabetes mellitus with diabetic chronic kidney disease: Secondary | ICD-10-CM | POA: Diagnosis not present

## 2019-03-08 DIAGNOSIS — I2510 Atherosclerotic heart disease of native coronary artery without angina pectoris: Secondary | ICD-10-CM | POA: Diagnosis not present

## 2019-03-08 DIAGNOSIS — R748 Abnormal levels of other serum enzymes: Secondary | ICD-10-CM

## 2019-03-08 MED ORDER — CILOSTAZOL 100 MG PO TABS: 100.0000 mg | ORAL_TABLET | Freq: Two times a day (BID) | ORAL | 1 refills | Status: DC

## 2019-03-08 NOTE — Progress Notes (Signed)
Primary Physician:  [name], MD   Patient ID: Justin [name], male    DOB: November 16, 1932, 85 y.o.   MRN: [id number]  Subjective:    Chief Complaint  Patient presents with   PAD   Hyperlipidemia   This visit type was conducted due to national recommendations for restrictions regarding the COVID-19 Pandemic (e.g. social distancing).  This format is felt to be most appropriate for this patient at this time.  All issues noted in this document were discussed and addressed.  No physical exam was performed (except for noted visual exam findings with Telehealth visits).  The patient has consented to conduct a Telehealth visit and understands insurance will be billed.   I discussed the limitations of evaluation and management by telemedicine and the availability of in person appointments. The patient expressed understanding and agreed to proceed.  Virtual Visit via Video Note is as below  I connected with@, on 03/08/19 at 0905 by telephone and verified that I am speaking with the correct person using two identifiers. Unable to perform video visit as patient did not have equipment.    I have discussed with the patient regarding the safety during COVID Pandemic and steps and precautions including social distancing with the patient.    HPI: Justin [name]  is a 85 y.o. male  with history of CAD, S/P CABG in 2009, PAD, type II diabetes, asymptomatic right carotid stenosis, and hyperlipidemia. Underwent coronary angiogram on 11/04/2016 at the same time also underwent peripheral arteriogram due to severe symptoms of claudication involving bilateral calf, showed mild disease in SFA and severe small vessel disease left worse in the right with slow flow in left and 2 vessel runoff. Patent coronary grafts except occluded svg to rca which is small and diffusely diseasd.ABI performed on 11/18/2017 revealed a right TBI of 0.6 and left TBI of 0.54. There was no significant change in lower extremity duplex  compared to 2017.  Patient has been admitted to the hospital both in January and March 2020 with upper abdominal pain, nausea and vomiting found to be septic with elevated liver enzymes.  Blood culture grew E. coli.  ERCP showed stone which was removed and biliary stent sphincterotomy was performed. At discharge, liver enzymes had essentially normalized. Statin is hold due to abnormal LFTs; however, patient reports that he was unaware of this and has continued to take the medicaiton.  At his last office visit 3 months ago, he was noted to be orthostatic.  Imdur dose was decreased and chlorthalidone was discontinued; however, has not made these changes.  He was encouraged to wear support stockings.  He now presents for 93-month follow-up.  Recent carotid duplex that showed increased velocity in RCA.  Claudication symptoms remain stable. He does mention soreness to bilateral toes and left toenail had developed area of discoloration and ulcer that he states resolved with using antifungal cream. Continues to have some discoloration. No other ulcerations. No chest pain. Dyspnea on exertion is stable. Has not had any further abdominal pain or N/V. He does continue to have some dizziness with position changes.   Past Medical History:  Diagnosis Date   Anxiety    Arthritis    Coronary artery disease    Diabetes mellitus without complication (HCC)    Diverticulitis    Dyspnea    GERD (gastroesophageal reflux disease)    Hypercholesteremia    Hypertension    Pneumonia    as a child   Prostate cancer ([place])  UTI (lower urinary tract infection)     Past Surgical History:  Procedure Laterality Date   ABDOMINAL SURGERY     for diverticulitis; also removed appendix   CATARACT EXTRACTION     CATARACT EXTRACTION, BILATERAL     CHOLECYSTECTOMY N/A 10/12/2017   Procedure: LAPAROSCOPIC CHOLECYSTECTOMY;  Surgeon: [name], MD;  Location: [place];  Service: General;  Laterality:  N/A;   CORONARY ARTERY BYPASS GRAFT  2009   ERCP N/A 12/21/2018   Procedure: ENDOSCOPIC RETROGRADE CHOLANGIOPANCREATOGRAPHY (ERCP);  Surgeon: [name], MD;  Location: [place];  Service: Endoscopy;  Laterality: N/A;   ESOPHAGOGASTRODUODENOSCOPY (EGD) WITH PROPOFOL N/A 12/17/2018   Procedure: ESOPHAGOGASTRODUODENOSCOPY (EGD) WITH PROPOFOL;  Surgeon: [name], MD;  Location: [place];  Service: Endoscopy;  Laterality: N/A;   EUS Left 12/17/2018   Procedure: UPPER ENDOSCOPIC ULTRASOUND (EUS) LINEAR;  Surgeon: [name], MD;  Location: [place];  Service: Endoscopy;  Laterality: Left;   EYE SURGERY     "for bleeding in eye"   HERNIA REPAIR     LEFT HEART CATH AND CORONARY ANGIOGRAPHY N/A 11/04/2016   Procedure: Left Heart Cath and Coronary Angiography;  Surgeon: [name], MD;  Location: [facility];  Service: Cardiovascular;  Laterality: N/A;   LOWER EXTREMITY ANGIOGRAPHY N/A 11/04/2016   Procedure: Lower Extremity Angiography;  Surgeon: [name], MD;  Location: [facility];  Service: Cardiovascular;  Laterality: N/A;   PROSTATE SURGERY     REMOVAL OF STONES  12/21/2018   Procedure: REMOVAL OF STONES;  Surgeon: [name], MD;  Location: [facility] ENDOSCOPY;  Service: Endoscopy;;   SPHINCTEROTOMY  12/21/2018   Procedure: [name];  Surgeon: [name], MD;  Location: [facility];  Service: Endoscopy;;    Social History   Socioeconomic History   Marital status: Widowed    Spouse name: Not on file   Number of children: 4   Years of education: Not on file   Highest education level: Not on file  Occupational History   Not on file  Social Needs   Financial resource strain: Not on file   Food insecurity:    Worry: Not on file    Inability: Not on file   Transportation needs:    Medical: Not on file    Non-medical: Not on file  Tobacco Use   Smoking status: Former Smoker   Smokeless tobacco: Never Used  Substance and Sexual  Activity   Alcohol use: No   Drug use: No   Sexual activity: Not on file  Lifestyle   Physical activity:    Days per week: Not on file    Minutes per session: Not on file   Stress: Not on file  Relationships   Social connections:    Talks on phone: Not on file    Gets together: Not on file    Attends religious service: Not on file    Active member of club or organization: Not on file    Attends meetings of clubs or organizations: Not on file    Relationship status: Not on file   Intimate partner violence:    Fear of current or ex partner: Not on file    Emotionally abused: Not on file    Physically abused: Not on file    Forced sexual activity: Not on file  Other Topics Concern   Not on file  Social History Narrative   Not on file    Review of Systems  Constitution: Positive for weight loss.  Negative for decreased appetite, malaise/fatigue and weight gain.  Eyes: Negative for visual disturbance.  Cardiovascular: Positive for claudication, dyspnea on exertion and leg swelling. Negative for chest pain, orthopnea, palpitations and syncope.  Respiratory: Negative for hemoptysis and wheezing.   Endocrine: Negative for cold intolerance and heat intolerance.  Hematologic/Lymphatic: Does not bruise/bleed easily.  Skin: Positive for color change. Negative for nail changes.  Musculoskeletal: Positive for back pain and muscle weakness. Negative for myalgias.  Gastrointestinal: Positive for abdominal pain, constipation and diarrhea. Negative for change in bowel habit, nausea and vomiting.  Neurological: Positive for dizziness. Negative for difficulty with concentration, focal weakness and headaches.  Psychiatric/Behavioral: Negative for altered mental status and suicidal ideas.  All other systems reviewed and are negative.     Objective:  Blood pressure (!) 150/80, pulse 62, height 5\' 7"  (1.702 m), weight 170 lb (77.1 kg). Body mass index is 26.63 kg/m.    Physical exam  not performed or limited due to virtual visit.    Please see exam details from prior visit is as below.   Physical Exam  Constitutional: He is oriented to person, place, and time. Vital signs are normal. He appears well-developed and well-nourished.  HENT:  Head: Normocephalic and atraumatic.  Neck: Normal range of motion.  Cardiovascular: Normal rate, regular rhythm, normal heart sounds and intact distal pulses.  Pulses:      Carotid pulses are on the right side with bruit and on the left side with bruit.      Femoral pulses are 2+ on the right side and 2+ on the left side.      Popliteal pulses are 2+ on the right side and 2+ on the left side.       Dorsalis pedis pulses are 1+ on the right side and 0 on the left side.       Posterior tibial pulses are 0 on the right side and 0 on the left side.  Split S2  Pulmonary/Chest: Effort normal and breath sounds normal. No accessory muscle usage. No respiratory distress.  Abdominal: Soft. Bowel sounds are normal.  Musculoskeletal: Normal range of motion.  Neurological: He is alert and oriented to person, place, and time.  Skin: Skin is warm and dry.  Vitals reviewed.  Radiology: No results found.  Laboratory examination:    CMP Latest Ref Rng & Units 12/23/2018 12/22/2018 12/21/2018  Glucose 70 - 99 mg/dL 114(H) 199(H) 76  BUN 8 - 23 mg/dL 14 14 9   Creatinine 0.61 - 1.24 mg/dL 1.30(H) 1.47(H) 1.23  Sodium 135 - 145 mmol/L 139 135 139  Potassium 3.5 - 5.1 mmol/L 3.5 4.1 3.5  Chloride 98 - 111 mmol/L 113(H) 112(H) 109  CO2 22 - 32 mmol/L 22 20(L) 21(L)  Calcium 8.9 - 10.3 mg/dL 8.1(L) 7.6(L) 8.4(L)  Total Protein 6.5 - 8.1 g/dL 5.2(L) 5.1(L) 5.3(L)  Total Bilirubin 0.3 - 1.2 mg/dL 1.2 1.8(H) 2.4(H)  Alkaline Phos 38 - 126 U/L 133(H) 152(H) 176(H)  AST 15 - 41 U/L 39 40 56(H)  ALT 0 - 44 U/L 42 40 49(H)   CBC Latest Ref Rng & Units 12/23/2018 12/22/2018 12/21/2018  WBC 4.0 - 10.5 K/uL 8.5 7.8 8.7  Hemoglobin 13.0 - 17.0 g/dL 9.2(L)  9.1(L) 8.9(L)  Hematocrit 39.0 - 52.0 % 28.1(L) 28.8(L) 27.7(L)  Platelets 150 - 400 K/uL 162 149(L) 141(L)   Lipid Panel  No results found for: CHOL, TRIG, HDL, CHOLHDL, VLDL, LDLCALC, LDLDIRECT HEMOGLOBIN A1C Lab Results  Component Value Date  HGBA1C 8.0 (H) 10/08/2017   MPG 182.9 10/08/2017   TSH No results for input(s): TSH in the last 8760 hours.  PRN Meds:. Medications Discontinued During This Encounter  Medication Reason   simvastatin (ZOCOR) 20 MG tablet Change in therapy   Current Meds  Medication Sig   acetaminophen (TYLENOL) 500 MG tablet Take 500 mg by mouth daily as needed for mild pain.   AMITIZA 24 MCG capsule Take 24 mcg by mouth daily with breakfast.    aspirin EC 81 MG tablet Take 81 mg by mouth daily.   chlorthalidone (HYGROTON) 25 MG tablet Take 25 mg by mouth daily.   clonazePAM (KLONOPIN) 0.5 MG tablet Take 0.5 mg by mouth daily as needed for anxiety.   DULoxetine HCl 40 MG CPEP Take 1 capsule by mouth daily.    isosorbide mononitrate (IMDUR) 60 MG 24 hr tablet Take 1 tablet (60 mg total) by mouth daily.   JANUVIA 100 MG tablet Take 100 mg by mouth daily.    LANTUS SOLOSTAR 100 UNIT/ML Solostar Pen Inject 15-50 Units into the skin 2 (two) times daily. 50 in the morning 15 in the evening   metoprolol (LOPRESSOR) 50 MG tablet Take 50 mg by mouth 2 (two) times daily.   nitroGLYCERIN (NITROSTAT) 0.4 MG SL tablet Place 1 tablet (0.4 mg total) under the tongue every 5 (five) minutes as needed for chest pain.   omeprazole (PRILOSEC) 20 MG capsule Take 20 mg by mouth daily.    rosuvastatin (CRESTOR) 20 MG tablet Take 20 mg by mouth daily.   tamsulosin (FLOMAX) 0.4 MG CAPS capsule Take 0.4 mg by mouth daily.   vitamin B-12 (CYANOCOBALAMIN) 1000 MCG tablet Take 1,000 mcg by mouth daily.   [DISCONTINUED] cilostazol (PLETAL) 100 MG tablet Take 1 tablet (100 mg total) by mouth 2 (two) times daily.   Current Facility-Administered Medications for the  03/08/19 encounter (Office Visit) with Miquel Dunn, NP  Medication   Bevacizumab (AVASTIN) SOLN 1.25 mg   Bevacizumab (AVASTIN) SOLN 1.25 mg   Bevacizumab (AVASTIN) SOLN 1.25 mg   Bevacizumab (AVASTIN) SOLN 1.25 mg    Cardiac Studies:   Carotid artery duplex 12/06/2018: Stenosis in the right internal carotid artery (50-69%), upper end of spectrum. The right PSV internal/common carotid artery ratio is consistent with a stenosis of >70%. Stenosis in the right external carotid artery (<50%). No significant stenosis left ICA. Antegrade right vertebral artery flow. Antegrade left vertebral artery flow. Compared to the study done on 05/06/2018, right ICA stenosis has mildly progressed by velocity and IDA/CCA ratio. Follow up in six months is appropriate if clinically indicated.  Lower extremity arterial duplex 11/18/2017: No hemodynamically significant stenoses are identified in the lower extremity arterial system. Abnormal right TBI (0.60). Abnormal left TBI(0.54).  Both TBIs are above the threshold for adequate healing.  ABIs are considered non-diagnostic due to non-compressible vessels in a long-term diabetic male with presumed medial wall calcification.  No significant change from LE arterial duplex 04/30/2016.  Peripheral arteriogram 11/04/2016: Normal abdominal aorta and aorto-iliac arteries. Minimal plaque bilateral SFA. Severe small vessel disease especially left leg at the ankle Medical therapy.  Echo- 12/24/2017 1. Left ventricle cavity is normal in size. Moderate concentric hypertrophy of the left ventricle. Normal global wall motion. Doppler evidence of grade I (impaired) diastolic dysfunction. Calculated EF 54%. 2. Left atrial cavity is mildly dilated. 3. Trileaflet aortic valve. Mild aortic valve leaflet calcification. 4. Mild to moderate mitral regurgitation. Mild calcification of the mitral valve  annulus. Mild mitral valve leaflet calcification. 5. Trace tricuspid  regurgitation  Coronary angiogram 11/04/2016: Native RCA small and diffusely diseased with distal 70-80% stenosis. Occluded SVG to RCA. Patent SVG to OM-RI, LIMA to LAD. Medical therapy.  Lexiscan myoview stress test 04/17/2016: 1. Resting EKG demonstrates normal sinus rhythm, left axis deviation, right bundle branch block. Stress EKG is nondiagnostic for ischemia as a pharmacologic stress test. There are frequent PACs during the stress test. Stress symptoms included dyspnea. 2. The Perfusion images reveal the left ventricle to be mildly dilated at 132 mL both in rest and stress images. There is a moderate-sized inferior and inferoapical scar with very mild peri-infarct ischemia noted especially towards the apex. Left ventricle systolic function was moderate to severely depressed at 36%. 3. This is an intermediate risk study, clinical correlation recommended.  Assessment:   Asymptomatic stenosis of right carotid artery  PAD (peripheral artery disease) (HCC)  Atherosclerosis of native coronary artery of native heart without angina pectoris  S/P CABG (coronary artery bypass graft)  Orthostatic hypotension  Elevated liver enzymes  CKD stage 3 due to type 2 diabetes mellitus ([place])  EKG 12/06/2018: Normal sinus rhythm at 69 bpm, left axis deviation, left anterior fasicular block, RBBB. Nonspecific T wave abnormality. No changes compared to EKG 06/24/2018.  Recommendations:   Patient seen today for 3-month follow-up visit for carotid stenosis and PAD.  He has recently been in the hospital with sepsis and abnormal liver enzymes.  His liver enzymes have now resolved and are essentially normal it was recommended that patient hold statin therapy; however, patient reports that he has continued to take Crestor since being at home.  Feel that he should continue with statin therapy unless contraindicated by GI standpoint, in view of his carotid stenosis, PAD, and CAD.  He will need close  monitoring of liver enzymes and will plan to check this in the next 6 weeks.  No symptoms of angina.    Blood pressure slightly elevated today, but patient's daughter states she had difficulty with their home cuff and feels that this is related to this.  She will continue with regular monitoring.  He was noted to have orthostatic hypotension at his last office visit, Imdur was reduced and chlorthalidone was discontinued; however, patient has continued to take medications.  We will continue with current medications as he is taking them in view of possibly uncontrolled hypertension.  Encouraged him to wear support stockings.  Claudication symptoms have remained stable.  He does mention dark area around his left toenail that has improved with using antifungal cream.  Question if related to PAD.  I would like to see him in the office in the next few weeks for close observation of this.  Encouraged him to contact me sooner for any worsening.  I have advised of risk of ischemia, infection, and potential limb loss if left untreated.  Patient verbalized understanding.  Patient has chronic kidney disease stage III that is monitored by Dr. Elmarie Shiley.  Reports diabetes is stable.  I will see him back in the next few weeks for follow-up.  Will obtain orthostatic blood pressures at that time.  Miquel Dunn, MSN, APRN, FNP-C [facility]. [place] Office: [phone] Fax: [phone]

## 2019-03-08 NOTE — Progress Notes (Signed)
[facility] Note  03/10/2019     CHIEF COMPLAINT Patient presents for Retina Follow Up   HISTORY OF PRESENT ILLNESS: Justin [name] is a 85 y.o. male who presents to the clinic today for:   HPI    Retina Follow Up    Patient presents with  Diabetic Retinopathy.  In both eyes.  This started months ago.  Severity is moderate.  Duration of 4 weeks.  Since onset it is stable.  I, the attending physician,  performed the HPI with the patient and updated documentation appropriately.          Comments    85 y/o male pt here for 4 wk f/u for NPDR OU.  S/p Kenalog OD and Avastin OS on 05.14.20.  Pt has been having lots of large floaters and "spiderwebs" OD that have been constant since last visit.  Pt has also been having intermittent minor pain OD and headaches on the right side of the back of his head.  No change in [place] OU.  No gtts.  BS 85 this a.m.  A1C 6.5.       Last edited by [name], MD on 03/10/2019 11:03 AM. (History)    pt states he had a lot of floaters from the kenalog injection last time  Referring physician: [name], MD [place],  [place] [address]  HISTORICAL INFORMATION:   Selected notes from the [facility] Referred by Dr. [name] for concern of mac edema OU LEE:  Ocular Hx-pseudo OU; formerly followed at [facility] Dr. [name]. S/p focal laser OS x2, history of IVK and IVT OU PMH-anxiety, arthritis, CAD, DM (taking lantus and januvia), HTN, high cholesterol, prostate cancer    CURRENT MEDICATIONS: No current outpatient medications on file. (Ophthalmic Drugs)   No current facility-administered medications for this visit.  (Ophthalmic Drugs)   Current Outpatient Medications (Other)  Medication Sig  . acetaminophen (TYLENOL) 500 MG tablet Take 500 mg by mouth daily as needed for mild pain.  Marland Kitchen AMITIZA 24 MCG capsule Take 24 mcg by mouth daily with breakfast.   .  aspirin EC 81 MG tablet Take 81 mg by mouth daily.  . chlorthalidone (HYGROTON) 25 MG tablet Take 25 mg by mouth daily.  . cholecalciferol (VITAMIN D3) 25 MCG (1000 UT) tablet Take 1,000 Units by mouth daily.  . cilostazol (PLETAL) 100 MG tablet Take 1 tablet (100 mg total) by mouth 2 (two) times daily.  . clonazePAM (KLONOPIN) 0.5 MG tablet Take 0.5 mg by mouth daily as needed for anxiety.  . DULoxetine HCl 40 MG CPEP Take 1 capsule by mouth daily.   . isosorbide mononitrate (IMDUR) 60 MG 24 hr tablet Take 1 tablet (60 mg total) by mouth daily.  Marland Kitchen JANUVIA 100 MG tablet Take 100 mg by mouth daily.   Marland Kitchen LANTUS SOLOSTAR 100 UNIT/ML Solostar Pen Inject 15-50 Units into the skin 2 (two) times daily. 50 in the morning 15 in the evening  . metoprolol (LOPRESSOR) 50 MG tablet Take 50 mg by mouth 2 (two) times daily.  . nitroGLYCERIN (NITROSTAT) 0.4 MG SL tablet Place 1 tablet (0.4 mg total) under the tongue every 5 (five) minutes as needed for chest pain.  Marland Kitchen omeprazole (PRILOSEC) 20 MG capsule Take 20 mg by mouth daily.   . pregabalin (LYRICA) 50 MG capsule Take 50 mg by mouth 3 (three) times daily.  . rosuvastatin (CRESTOR) 20 MG  tablet Take 20 mg by mouth daily.  . tamsulosin (FLOMAX) 0.4 MG CAPS capsule Take 0.4 mg by mouth daily.  . vitamin B-12 (CYANOCOBALAMIN) 1000 MCG tablet Take 1,000 mcg by mouth daily.   Current Facility-Administered Medications (Other)  Medication Route  . Bevacizumab (AVASTIN) SOLN 1.25 mg Intravitreal  . Bevacizumab (AVASTIN) SOLN 1.25 mg Intravitreal  . Bevacizumab (AVASTIN) SOLN 1.25 mg Intravitreal  . Bevacizumab (AVASTIN) SOLN 1.25 mg Intravitreal      REVIEW OF SYSTEMS: ROS    Positive for: Gastrointestinal, Genitourinary, Endocrine, Cardiovascular, Eyes   Negative for: Constitutional, Neurological, Skin, Musculoskeletal, HENT, Respiratory, Psychiatric, Allergic/Imm, Heme/Lymph   Last edited by Matthew Folks, COA on 03/10/2019  8:43 AM. (History)        ALLERGIES Allergies  Allergen Reactions  . Dye Fdc Red [Red Dye] Swelling and Other (See Comments)    CAT scan dye  . Ibuprofen Other (See Comments)    Upset stomach     PAST MEDICAL HISTORY Past Medical History:  Diagnosis Date  . Anxiety   . Arthritis   . Coronary artery disease   . Diabetes mellitus without complication ([place])   . Diabetic retinopathy ([place])    NPDR OU  . Diverticulitis   . Dyspnea   . GERD (gastroesophageal reflux disease)   . Hypercholesteremia   . Hypertension   . Hypertensive retinopathy    OU  . Pneumonia    as a child  . Prostate cancer ([place])   . UTI (lower urinary tract infection)    Past Surgical History:  Procedure Laterality Date  . ABDOMINAL SURGERY     for diverticulitis; also removed appendix  . CATARACT EXTRACTION    . CATARACT EXTRACTION, BILATERAL    . CHOLECYSTECTOMY N/A 10/12/2017   Procedure: LAPAROSCOPIC CHOLECYSTECTOMY;  Surgeon: [name], MD;  Location: [facility];  Service: General;  Laterality: N/A;  . CORONARY ARTERY BYPASS GRAFT  2009  . ERCP N/A 12/21/2018   Procedure: ENDOSCOPIC RETROGRADE CHOLANGIOPANCREATOGRAPHY (ERCP);  Surgeon: [name], MD;  Location: [facility];  Service: Endoscopy;  Laterality: N/A;  . ESOPHAGOGASTRODUODENOSCOPY (EGD) WITH PROPOFOL N/A 12/17/2018   Procedure: ESOPHAGOGASTRODUODENOSCOPY (EGD) WITH PROPOFOL;  Surgeon: [name], MD;  Location: [facility];  Service: Endoscopy;  Laterality: N/A;  . EUS Left 12/17/2018   Procedure: UPPER ENDOSCOPIC ULTRASOUND (EUS) LINEAR;  Surgeon: [name], MD;  Location: [place] [facility];  Service: Endoscopy;  Laterality: Left;  . EYE SURGERY     "for bleeding in eye"  . HERNIA REPAIR    . LEFT HEART CATH AND CORONARY ANGIOGRAPHY N/A 11/04/2016   Procedure: Left Heart Cath and Coronary Angiography;  Surgeon: [name], MD;  Location: [facility];  Service: Cardiovascular;  Laterality: N/A;  . LOWER EXTREMITY ANGIOGRAPHY N/A 11/04/2016    Procedure: Lower Extremity Angiography;  Surgeon: [name], MD;  Location: [facility];  Service: Cardiovascular;  Laterality: N/A;  . PROSTATE SURGERY    . REMOVAL OF STONES  12/21/2018   Procedure: REMOVAL OF STONES;  Surgeon: [name], MD;  Location: [facility];  Service: Endoscopy;;  . [name]  12/21/2018   Procedure: [name];  Surgeon: [name], MD;  Location: [facility] ENDOSCOPY;  Service: Endoscopy;;    FAMILY HISTORY Family History  Problem Relation Age of Onset  . Diabetes Father   . Diabetes Maternal Aunt   . Diabetes Maternal Uncle   . Diabetes Maternal Grandmother   . Heart disease Sister   . Diabetes Brother   .  Heart disease Sister     SOCIAL HISTORY Social History   Tobacco Use  . Smoking status: Former Research scientist (life sciences)  . Smokeless tobacco: Never Used  Substance Use Topics  . Alcohol use: No  . Drug use: No         OPHTHALMIC EXAM:  Base Eye Exam    Visual Acuity (Snellen - Linear)      Right Left   Dist cc 20/40 20/40   Dist ph cc 20/30 20/30   Correction: Glasses       Tonometry (Tonopen, 8:48 AM)      Right Left   Pressure 13 13       Pupils      Dark Light Shape React APD   Right 3 2 Round Brisk None   Left 3 2 Round Brisk None       Visual Fields (Counting fingers)      Left Right     Full       Extraocular Movement      Right Left    Full, Ortho Full, Ortho       Neuro/Psych    Oriented x3: Yes   Mood/Affect: Normal       Dilation    Both eyes: 1.0% Mydriacyl, 2.5% Phenylephrine @ 8:48 AM        Slit Lamp and Fundus Exam    Slit Lamp Exam      Right Left   Lids/Lashes Dermatochalasis - upper lid, Meibomian gland dysfunction Dermatochalasis - upper lid, Meibomian gland dysfunction   Conjunctiva/Sclera Melanosis Melanosis   Cornea Arcus, 1+ Punctate epithelial erosions Arcus, 1+ Punctate epithelial erosions   Anterior Chamber Deep and quiet Deep and quiet   Iris Mild Temporal Iris atrophy, Round and  dilated, No NVI Mild Temporal Iris atrophy, Round and dilated, No NVI   Lens PC IOL in good position with mild aterior capsule fimosis PC IOL in good position with mild aterior capsule fimosis   Vitreous Vitreous syneresis, Posterior vitreous detachment, vitreous condensations Vitreous syneresis, refractile deposits in posterior hyaloid, mild Asteroid hyalosis       Fundus Exam      Right Left   Disc Pink and Sharp, Peripapillary atrophy pink and shap, Tilted disc, mild temporal Temporal Peripapillary atrophy   C/D Ratio 0.2 0.3   Macula Blunted foveal reflex, Retinal pigment epithelial mottling and clumping, Microaneurysms -- improved, persistent cystic changes  Blunted foveal reflex, focal area of RPE atrophy SN to fovea, Microaneurysms, Retinal pigment epithelial mottling, refractile Epiretinal membrane, persistent cystic changes   Vessels Vascular attenuation, Tortuous, AV crossing changes Tortuous, Vascular attenuation   Periphery Attached, scattered MA Attached, scattered IRH          IMAGING AND PROCEDURES  Imaging and Procedures for _0 @  OCT, Retina - OU - Both Eyes       Right Eye Quality was good. Central Foveal Thickness: 416. Progression has been stable. Findings include abnormal foveal contour, intraretinal fluid, no SRF, retinal drusen , outer retinal atrophy (No interval improvement).   Left Eye Quality was good. Central Foveal Thickness: 282. Progression has been stable. Findings include abnormal foveal contour, no SRF, intraretinal fluid, outer retinal atrophy, retinal drusen  (Mild interval increase in central cyst).   Notes *Images captured and stored on drive  Diagnosis / Impression:  DME OU, OD>OS OD - persistent IRF -- no interval improvement OS - persistent IRF; Mild interval increase in central cyst  Clinical management:  See below  Abbreviations:  NFP - Normal foveal profile. CME - cystoid macular edema. PED - pigment epithelial detachment. IRF -  intraretinal fluid. SRF - subretinal fluid. EZ - ellipsoid zone. ERM - epiretinal membrane. ORA - outer retinal atrophy. ORT - outer retinal tubulation. SRHM - subretinal hyper-reflective material         Intravitreal Injection, Pharmacologic Agent - OS - Left Eye       Time Out 03/10/2019. 10:00 AM. Confirmed correct patient, procedure, site, and patient consented.   Anesthesia Topical anesthesia was used. Anesthetic medications included Tetracaine 0.5%, Lidocaine 2%.   Procedure Preparation included 5% betadine to ocular surface, eyelid speculum. A supplied needle was used.   Injection:  1.25 mg Bevacizumab (AVASTIN) SOLN   NDC: 01093-235-57, Lot: [id number] , Expiration date: 04/25/2019   Route: Intravitreal, Site: Left Eye, Waste: 0 mL  Post-op Post injection exam found visual acuity of at least counting fingers. The patient tolerated the procedure well. There were no complications. The patient received written and verbal post procedure care education.        Intravitreal Injection, Pharmacologic Agent - OD - Right Eye       Time Out 03/10/2019. 9:55 AM. Confirmed correct patient, procedure, site, and patient consented.   Anesthesia Topical anesthesia was used. Anesthetic medications included Lidocaine 2%, Tetracaine 0.5%.   Procedure Preparation included 5% betadine to ocular surface, eyelid speculum. A 27 gauge needle was used.   Injection:  1.25 mg Bevacizumab (AVASTIN) SOLN   NDC: 32202-542-70, Lot: [id number] , Expiration date: 04/25/2019   Route: Intravitreal, Site: Right Eye, Waste: 0 mL  Post-op Post injection exam found visual acuity of at least counting fingers. The patient tolerated the procedure well. There were no complications. The patient received written and verbal post procedure care education.                 ASSESSMENT/PLAN:    ICD-10-CM   1. Moderate nonproliferative diabetic retinopathy of both eyes with macular edema associated with  type 2 diabetes mellitus (HCC)  W23.7628 Intravitreal Injection, Pharmacologic Agent - OS - Left Eye    Intravitreal Injection, Pharmacologic Agent - OD - Right Eye    Bevacizumab (AVASTIN) SOLN 1.25 mg    Bevacizumab (AVASTIN) SOLN 1.25 mg  2. Retinal edema  H35.81 OCT, Retina - OU - Both Eyes  3. Essential hypertension  I10   4. Hypertensive retinopathy of both eyes  H35.033   5. Pseudophakia of both eyes  Z96.1     1,2. Moderate non-proliferative diabetic retinopathy with edema OU   - moved to [place] from [place], [place] -- history of prior injections OS with Dr. [name]  - records received and reviewed from [facility] (Dr. [name])  - history of focal laser OS x2 and IVK/IVT OU in [place] -- last injection was IVK OS November 2013  - exam scattered IRH, DBH and +macular edema  - OCT shows diabetic macular edema, both eyes, (OD > OS)  - S/P IVA OD #1 (09.17.19), #2 (10.29.19), #3 (11.26.19), #4 (01.02.20), #5 (01.30.20), #6 (02.28.20), #7 (04.16.20)  - S/P IVA OS #1 (10.01.19), #2 (10.29.19), #3 (11.26.19), #4 (01.02.20), #5 (01.30.20), #6 (02.28.20), #7 (04.16.20), #8 (05.14.20)  - S/P IVK OD #1 (05.14.20)  - FA (10.1.19) showed late leaking MA OU -- no NV  - delayed f/u on 4.16.20 due to hospitalization for sepsis / gallstones  - OCT shows persistent IRF OU -- ?resistance to IVA OD and no significant improvement with IVK OD (5.14.20)  -  BCVA stable at 20/30 OU  - recommend switching back to Avastin OD (#8 today), IVA OS #9, 06.11.20  - pt wishes to proceed  - RBA of procedure discussed, questions answered  - informed consent obtained and signed  - see procedure note  - Eylea4U benefits investigation initiated 1.31.2020 -- not approved  - f/u in 4 weeks, DFE, OCT, possible injection  3,4. Hypertensive retinopathy OU  - discussed importance of tight BP control  - monitor  5. Pseudophakia OU  - s/p CE/IOL OU in [place], [place]  - beautiful surgeries, doing well  -  monitor   Ophthalmic Meds Ordered this visit:  Meds ordered this encounter  Medications  . Bevacizumab (AVASTIN) SOLN 1.25 mg  . Bevacizumab (AVASTIN) SOLN 1.25 mg       Return in about 4 weeks (around 04/07/2019) for f/u NPDR OU, DFE, OCT.  There are no Patient Instructions on file for this visit.   Explained the diagnoses, plan, and follow up with the patient and they expressed understanding.  Patient expressed understanding of the importance of proper follow up care.   This document serves as a record of services personally performed by Gardiner Sleeper, MD, PhD. It was created on their behalf by Ernest Mallick, OA, an ophthalmic assistant. The creation of this record is the provider's dictation and/or activities during the visit.    Electronically signed by: Ernest Mallick, OA  06.09.2020 11:12 PM    Gardiner Sleeper, M.D., Ph.D. Diseases & Surgery of the Retina and Vitreous [facility]  I have reviewed the above documentation for accuracy and completeness, and I agree with the above. Gardiner Sleeper, M.D., Ph.D. 03/10/19 11:12 PM    Abbreviations: M myopia (nearsighted); A astigmatism; H hyperopia (farsighted); P presbyopia; Mrx spectacle prescription;  CTL contact lenses; OD right eye; OS left eye; OU both eyes  XT exotropia; ET esotropia; PEK punctate epithelial keratitis; PEE punctate epithelial erosions; DES dry eye syndrome; MGD meibomian gland dysfunction; ATs artificial tears; PFAT's preservative free artificial tears; [place] nuclear sclerotic cataract; PSC posterior subcapsular cataract; ERM epi-retinal membrane; PVD posterior vitreous detachment; RD retinal detachment; DM diabetes mellitus; DR diabetic retinopathy; NPDR non-proliferative diabetic retinopathy; PDR proliferative diabetic retinopathy; CSME clinically significant macular edema; DME diabetic macular edema; dbh dot blot hemorrhages; CWS cotton wool spot; POAG primary open angle glaucoma; C/D  cup-to-disc ratio; HVF humphrey visual field; GVF goldmann visual field; OCT optical coherence tomography; IOP intraocular pressure; BRVO Branch retinal vein occlusion; CRVO central retinal vein occlusion; CRAO central retinal artery occlusion; BRAO branch retinal artery occlusion; RT retinal tear; SB scleral buckle; PPV pars plana vitrectomy; VH Vitreous hemorrhage; PRP panretinal laser photocoagulation; IVK intravitreal kenalog; VMT vitreomacular traction; MH Macular hole;  NVD neovascularization of the disc; NVE neovascularization elsewhere; AREDS age related eye disease study; ARMD age related macular degeneration; POAG primary open angle glaucoma; EBMD epithelial/anterior basement membrane dystrophy; ACIOL anterior chamber intraocular lens; IOL intraocular lens; PCIOL posterior chamber intraocular lens; Phaco/IOL phacoemulsification with intraocular lens placement; [place] photorefractive keratectomy; LASIK laser assisted in situ keratomileusis; HTN hypertension; DM diabetes mellitus; COPD chronic obstructive pulmonary disease

## 2019-03-10 ENCOUNTER — Encounter (INDEPENDENT_AMBULATORY_CARE_PROVIDER_SITE_OTHER): Payer: Self-pay | Admitting: Ophthalmology

## 2019-03-10 ENCOUNTER — Other Ambulatory Visit: Payer: Self-pay

## 2019-03-10 ENCOUNTER — Ambulatory Visit (INDEPENDENT_AMBULATORY_CARE_PROVIDER_SITE_OTHER): Payer: Medicare PPO | Admitting: Ophthalmology

## 2019-03-10 DIAGNOSIS — Z961 Presence of intraocular lens: Secondary | ICD-10-CM

## 2019-03-10 DIAGNOSIS — H35033 Hypertensive retinopathy, bilateral: Secondary | ICD-10-CM | POA: Diagnosis not present

## 2019-03-10 DIAGNOSIS — E113313 Type 2 diabetes mellitus with moderate nonproliferative diabetic retinopathy with macular edema, bilateral: Secondary | ICD-10-CM

## 2019-03-10 DIAGNOSIS — I10 Essential (primary) hypertension: Secondary | ICD-10-CM

## 2019-03-10 DIAGNOSIS — H3581 Retinal edema: Secondary | ICD-10-CM

## 2019-03-10 MED ORDER — BEVACIZUMAB CHEMO INJECTION 1.25MG/0.05ML SYRINGE FOR KALEIDOSCOPE
1.2500 mg | INTRAVITREAL | Status: AC | PRN
  Administered 2019-03-10: 1.25 mg via INTRAVITREAL

## 2019-03-14 DIAGNOSIS — F419 Anxiety disorder, unspecified: Secondary | ICD-10-CM | POA: Diagnosis not present

## 2019-03-14 DIAGNOSIS — Z125 Encounter for screening for malignant neoplasm of prostate: Secondary | ICD-10-CM | POA: Diagnosis not present

## 2019-03-14 DIAGNOSIS — Z136 Encounter for screening for cardiovascular disorders: Secondary | ICD-10-CM | POA: Diagnosis not present

## 2019-03-14 DIAGNOSIS — N400 Enlarged prostate without lower urinary tract symptoms: Secondary | ICD-10-CM | POA: Diagnosis not present

## 2019-03-14 DIAGNOSIS — E118 Type 2 diabetes mellitus with unspecified complications: Secondary | ICD-10-CM | POA: Diagnosis not present

## 2019-03-14 DIAGNOSIS — R1013 Epigastric pain: Secondary | ICD-10-CM | POA: Diagnosis not present

## 2019-03-14 DIAGNOSIS — I10 Essential (primary) hypertension: Secondary | ICD-10-CM | POA: Diagnosis not present

## 2019-03-14 DIAGNOSIS — I2510 Atherosclerotic heart disease of native coronary artery without angina pectoris: Secondary | ICD-10-CM | POA: Diagnosis not present

## 2019-03-14 DIAGNOSIS — Z0000 Encounter for general adult medical examination without abnormal findings: Secondary | ICD-10-CM | POA: Diagnosis not present

## 2019-03-21 ENCOUNTER — Encounter: Payer: Self-pay | Admitting: Cardiology

## 2019-03-21 ENCOUNTER — Other Ambulatory Visit: Payer: Self-pay

## 2019-03-21 ENCOUNTER — Ambulatory Visit: Payer: Medicare PPO | Admitting: Cardiology

## 2019-03-21 VITALS — Temp 97.1°F | Ht 67.5 in | Wt 176.0 lb

## 2019-03-21 DIAGNOSIS — R748 Abnormal levels of other serum enzymes: Secondary | ICD-10-CM | POA: Diagnosis not present

## 2019-03-21 DIAGNOSIS — I2510 Atherosclerotic heart disease of native coronary artery without angina pectoris: Secondary | ICD-10-CM | POA: Diagnosis not present

## 2019-03-21 DIAGNOSIS — I951 Orthostatic hypotension: Secondary | ICD-10-CM | POA: Diagnosis not present

## 2019-03-21 DIAGNOSIS — I739 Peripheral vascular disease, unspecified: Secondary | ICD-10-CM | POA: Diagnosis not present

## 2019-03-21 MED ORDER — XARELTO 2.5 MG PO TABS: 2.5000 mg | ORAL_TABLET | Freq: Two times a day (BID) | ORAL | 1 refills | Status: DC

## 2019-03-21 MED ORDER — ROSUVASTATIN CALCIUM 20 MG PO TABS: 20.0000 mg | ORAL_TABLET | Freq: Every day | ORAL | 1 refills | Status: DC

## 2019-03-21 NOTE — Patient Instructions (Signed)
Labs today to check liver enzymes  Lower extremity US will be scheduled.  Stop Pletal. Will start low dose Xarelto

## 2019-03-21 NOTE — Progress Notes (Signed)
Primary Physician:  [name], MD   Patient ID: Justin [name], male    DOB: 1933/06/08, 85 y.o.   MRN: [id number]  Subjective:    Chief Complaint  Patient presents with  . Hypertension    orthostatics  . Wound Check    left foot  . Follow-up    2wk     HPI: Justin [name]  is a 85 y.o. male  with history of CAD, S/P CABG in 2009, PAD, type II diabetes, asymptomatic right carotid stenosis, and hyperlipidemia. Underwent coronary angiogram on 11/04/2016 at the same time also underwent peripheral arteriogram due to severe symptoms of claudication involving bilateral calf, showed mild disease in SFA and severe small vessel disease left worse in the right with slow flow in left and 2 vessel runoff. Patent coronary grafts except occluded svg to rca which is small and diffusely diseasd.ABI performed on 11/18/2017 revealed a right TBI of 0.6 and left TBI of 0.54. There was no significant change in lower extremity duplex compared to 2017.  Patient has been admitted to the hospital both in January and March 2020 with upper abdominal pain, nausea and vomiting found to be septic with elevated liver enzymes.  Blood culture grew E. coli.  ERCP showed stone which was removed and biliary stent sphincterotomy was performed. At discharge, liver enzymes had essentially normalized. Statin was held due to abnormal LFTs; however, patient reports that he was unaware of this and has continued to take the medicaiton. I had advised him to continue with statin in view of his CAD, PAD, and carotid disease. Labs were ordered, but not performed yet.  I had last seen the patient a few weeks ago virtually, he mentioned discoloration to one of his toenails. I had asked him to come in for follow up. He continues to have some discoloration to around both toenails and tenderness to both toes. No ulcerations. Recent carotid duplex that showed increased velocity in RCA.  Claudication symptoms remain stable, has  bilateral leg weakness with over exertion. No chest pain. Dyspnea on exertion is stable. Has not had any further abdominal pain or N/V. He does continue to have some dizziness with position changes, but improves on days that he wears support stockings.   Past Medical History:  Diagnosis Date  . Anxiety   . Arthritis   . Coronary artery disease   . Diabetes mellitus without complication ([place])   . Diabetic retinopathy ([place])    NPDR OU  . Diverticulitis   . Dyspnea   . GERD (gastroesophageal reflux disease)   . Hypercholesteremia   . Hypertension   . Hypertensive retinopathy    OU  . Pneumonia    as a child  . Prostate cancer ([place])   . UTI (lower urinary tract infection)     Past Surgical History:  Procedure Laterality Date  . ABDOMINAL SURGERY     for diverticulitis; also removed appendix  . CATARACT EXTRACTION    . CATARACT EXTRACTION, BILATERAL    . CHOLECYSTECTOMY N/A 10/12/2017   Procedure: LAPAROSCOPIC CHOLECYSTECTOMY;  Surgeon: [name], MD;  Location: [place];  Service: General;  Laterality: N/A;  . CORONARY ARTERY BYPASS GRAFT  2009  . ERCP N/A 12/21/2018   Procedure: ENDOSCOPIC RETROGRADE CHOLANGIOPANCREATOGRAPHY (ERCP);  Surgeon: [name], MD;  Location: [place];  Service: Endoscopy;  Laterality: N/A;  . ESOPHAGOGASTRODUODENOSCOPY (EGD) WITH PROPOFOL N/A 12/17/2018   Procedure: ESOPHAGOGASTRODUODENOSCOPY (EGD) WITH PROPOFOL;  Surgeon: [name], MD;  Location: [place];  Service:  Endoscopy;  Laterality: N/A;  . EUS Left 12/17/2018   Procedure: UPPER ENDOSCOPIC ULTRASOUND (EUS) LINEAR;  Surgeon: [name], MD;  Location: [place];  Service: Endoscopy;  Laterality: Left;  . EYE SURGERY     "for bleeding in eye"  . HERNIA REPAIR    . LEFT HEART CATH AND CORONARY ANGIOGRAPHY N/A 11/04/2016   Procedure: Left Heart Cath and Coronary Angiography;  Surgeon: [name], MD;  Location: [facility];  Service: Cardiovascular;  Laterality: N/A;   . LOWER EXTREMITY ANGIOGRAPHY N/A 11/04/2016   Procedure: Lower Extremity Angiography;  Surgeon: [name], MD;  Location: [facility];  Service: Cardiovascular;  Laterality: N/A;  . PROSTATE SURGERY    . REMOVAL OF STONES  12/21/2018   Procedure: REMOVAL OF STONES;  Surgeon: [name], MD;  Location: [facility] ENDOSCOPY;  Service: Endoscopy;;  . [name]  12/21/2018   Procedure: [name];  Surgeon: [name], MD;  Location: [facility] END[facility];  Service: Endoscopy;;    Social History   Socioeconomic History  . Marital status: Widowed    Spouse name: Not on file  . Number of children: 4  . Years of education: Not on file  . Highest education level: Not on file  Occupational History  . Not on file  Social Needs  . Financial resource strain: Not on file  . Food insecurity    Worry: Not on file    Inability: Not on file  . Transportation needs    Medical: Not on file    Non-medical: Not on file  Tobacco Use  . Smoking status: Former Smoker    Years: 2.00  . Smokeless tobacco: Never Used  . Tobacco comment: when he was a teenage age [age]  Substance and Sexual Activity  . Alcohol use: No  . Drug use: No  . Sexual activity: Not on file  Lifestyle  . Physical activity    Days per week: Not on file    Minutes per session: Not on file  . Stress: Not on file  Relationships  . Social Herbalist on phone: Not on file    Gets together: Not on file    Attends religious service: Not on file    Active member of club or organization: Not on file    Attends meetings of clubs or organizations: Not on file    Relationship status: Not on file  . Intimate partner violence    Fear of current or ex partner: Not on file    Emotionally abused: Not on file    Physically abused: Not on file    Forced sexual activity: Not on file  Other Topics Concern  . Not on file  Social History Narrative  . Not on file    Review of Systems  Constitution: Positive for weight  loss. Negative for decreased appetite, malaise/fatigue and weight gain.  Eyes: Negative for visual disturbance.  Cardiovascular: Positive for claudication, dyspnea on exertion and leg swelling. Negative for chest pain, orthopnea, palpitations and syncope.  Respiratory: Negative for hemoptysis and wheezing.   Endocrine: Negative for cold intolerance and heat intolerance.  Hematologic/Lymphatic: Does not bruise/bleed easily.  Skin: Positive for color change. Negative for nail changes.  Musculoskeletal: Positive for back pain and muscle weakness. Negative for myalgias.  Gastrointestinal: Negative for abdominal pain, change in bowel habit, constipation, diarrhea, nausea and vomiting.  Neurological: Positive for dizziness. Negative for difficulty with concentration, focal weakness and headaches.  Psychiatric/Behavioral: Negative for altered  mental status and suicidal ideas.  All other systems reviewed and are negative.     Objective:  Temperature (!) 97.1 F (36.2 C), height 5' 7.5" (1.715 m), weight 176 lb (79.8 kg), SpO2 98 %. Body mass index is 27.16 kg/m.      Physical Exam  Constitutional: He is oriented to person, place, and time. Vital signs are normal. He appears well-developed and well-nourished.  HENT:  Head: Normocephalic and atraumatic.  Neck: Normal range of motion.  Cardiovascular: Normal rate, regular rhythm, normal heart sounds and intact distal pulses.  Pulses:      Carotid pulses are on the right side with bruit and on the left side with bruit.      Femoral pulses are 2+ on the right side and 2+ on the left side.      Popliteal pulses are 1+ on the right side and 1+ on the left side.       Dorsalis pedis pulses are 1+ on the right side and 1+ on the left side.       Posterior tibial pulses are 0 on the right side and 0 on the left side.  Split S2  Pulmonary/Chest: Effort normal and breath sounds normal. No accessory muscle usage. No respiratory distress.  Abdominal:  Soft. Bowel sounds are normal.  Musculoskeletal: Normal range of motion.  Neurological: He is alert and oriented to person, place, and time.  Skin: Skin is warm and dry.  Vitals reviewed.  Radiology: No results found.  Laboratory examination:    CMP Latest Ref Rng & Units 12/23/2018 12/22/2018 12/21/2018  Glucose 70 - 99 mg/dL 114(H) 199(H) 76  BUN 8 - 23 mg/dL '14 14 9  ' Creatinine 0.61 - 1.24 mg/dL 1.30(H) 1.47(H) 1.23  Sodium 135 - 145 mmol/L 139 135 139  Potassium 3.5 - 5.1 mmol/L 3.5 4.1 3.5  Chloride 98 - 111 mmol/L 113(H) 112(H) 109  CO2 22 - 32 mmol/L 22 20(L) 21(L)  Calcium 8.9 - 10.3 mg/dL 8.1(L) 7.6(L) 8.4(L)  Total Protein 6.5 - 8.1 g/dL 5.2(L) 5.1(L) 5.3(L)  Total Bilirubin 0.3 - 1.2 mg/dL 1.2 1.8(H) 2.4(H)  Alkaline Phos 38 - 126 U/L 133(H) 152(H) 176(H)  AST 15 - 41 U/L 39 40 56(H)  ALT 0 - 44 U/L 42 40 49(H)   CBC Latest Ref Rng & Units 12/23/2018 12/22/2018 12/21/2018  WBC 4.0 - 10.5 K/uL 8.5 7.8 8.7  Hemoglobin 13.0 - 17.0 g/dL 9.2(L) 9.1(L) 8.9(L)  Hematocrit 39.0 - 52.0 % 28.1(L) 28.8(L) 27.7(L)  Platelets 150 - 400 K/uL 162 149(L) 141(L)   Lipid Panel  No results found for: CHOL, TRIG, HDL, CHOLHDL, VLDL, LDLCALC, LDLDIRECT HEMOGLOBIN A1C Lab Results  Component Value Date   HGBA1C 8.0 (H) 10/08/2017   MPG 182.9 10/08/2017   TSH No results for input(s): TSH in the last 8760 hours.  PRN Meds:. Medications Discontinued During This Encounter  Medication Reason  . cholecalciferol (VITAMIN D3) 25 MCG (1000 UT) tablet Completed Course  . DULoxetine HCl 40 MG CPEP Patient Preference  . pregabalin (LYRICA) 50 MG capsule Change in therapy  . cilostazol (PLETAL) 100 MG tablet Discontinued by provider  . rosuvastatin (CRESTOR) 20 MG tablet Reorder   Current Meds  Medication Sig  . acetaminophen (TYLENOL) 500 MG tablet Take 500 mg by mouth daily as needed for mild pain.  Marland Kitchen AMITIZA 24 MCG capsule Take 24 mcg by mouth as needed for constipation.   Marland Kitchen aspirin EC  81 MG tablet Take 81 mg  by mouth daily.  . chlorthalidone (HYGROTON) 25 MG tablet Take 25 mg by mouth daily.  . clonazePAM (KLONOPIN) 0.5 MG tablet Take 0.5 mg by mouth daily as needed for anxiety.  . isosorbide mononitrate (IMDUR) 60 MG 24 hr tablet Take 1 tablet (60 mg total) by mouth daily.  Marland Kitchen JANUVIA 100 MG tablet Take 100 mg by mouth daily.   Marland Kitchen LANTUS SOLOSTAR 100 UNIT/ML Solostar Pen Inject 15-50 Units into the skin 2 (two) times daily. 50 in the morning 15 in the evening  . metoprolol (LOPRESSOR) 50 MG tablet Take 50 mg by mouth 2 (two) times daily.  . nitroGLYCERIN (NITROSTAT) 0.4 MG SL tablet Place 1 tablet (0.4 mg total) under the tongue every 5 (five) minutes as needed for chest pain.  Marland Kitchen omeprazole (PRILOSEC) 40 MG capsule Take by mouth daily.   . rosuvastatin (CRESTOR) 20 MG tablet Take 1 tablet (20 mg total) by mouth daily.  . tamsulosin (FLOMAX) 0.4 MG CAPS capsule Take 0.4 mg by mouth daily.  . vitamin B-12 (CYANOCOBALAMIN) 1000 MCG tablet Take 1,000 mcg by mouth daily.  . vitamin B-6 (PYRIDOXINE) 25 MG tablet Take 25 mg by mouth daily.  . [DISCONTINUED] cilostazol (PLETAL) 100 MG tablet Take 1 tablet (100 mg total) by mouth 2 (two) times daily.  . [DISCONTINUED] rosuvastatin (CRESTOR) 20 MG tablet Take 20 mg by mouth daily.   Current Facility-Administered Medications for the 03/21/19 encounter (Office Visit) with Miquel Dunn, NP  Medication  . Bevacizumab (AVASTIN) SOLN 1.25 mg  . Bevacizumab (AVASTIN) SOLN 1.25 mg  . Bevacizumab (AVASTIN) SOLN 1.25 mg  . Bevacizumab (AVASTIN) SOLN 1.25 mg    Cardiac Studies:   Carotid artery duplex 11-21-18: Stenosis in the right internal carotid artery (50-69%), upper end of spectrum. The right PSV internal/common carotid artery ratio is consistent with a stenosis of >70%. Stenosis in the right external carotid artery (<50%). No significant stenosis left ICA. Antegrade right vertebral artery flow. Antegrade left vertebral  artery flow. Compared to the study done on 05/06/2018, right ICA stenosis has mildly progressed by velocity and IDA/CCA ratio. Follow up in six months is appropriate if clinically indicated.  Lower extremity arterial duplex 11/18/2017: No hemodynamically significant stenoses are identified in the lower extremity arterial system. Abnormal right TBI (0.60). Abnormal left TBI(0.54).  Both TBIs are above the threshold for adequate healing.  ABIs are considered non-diagnostic due to non-compressible vessels in a long-term diabetic male with presumed medial wall calcification.  No significant change from LE arterial duplex 04/30/2016.  Peripheral arteriogram 11/04/2016: Normal abdominal aorta and aorto-iliac arteries. Minimal plaque bilateral SFA. Severe small vessel disease especially left leg at the ankle Medical therapy.  Echo- 12/24/2017 1. Left ventricle cavity is normal in size. Moderate concentric hypertrophy of the left ventricle. Normal global wall motion. Doppler evidence of grade I (impaired) diastolic dysfunction. Calculated EF 54%. 2. Left atrial cavity is mildly dilated. 3. Trileaflet aortic valve. Mild aortic valve leaflet calcification. 4. Mild to moderate mitral regurgitation. Mild calcification of the mitral valve annulus. Mild mitral valve leaflet calcification. 5. Trace tricuspid regurgitation  Coronary angiogram 11/04/2016: Native RCA small and diffusely diseased with distal 70-80% stenosis. Occluded SVG to RCA. Patent SVG to OM-RI, LIMA to LAD. Medical therapy.  Lexiscan myoview stress test 04/17/2016: 1. Resting EKG demonstrates normal sinus rhythm, left axis deviation, right bundle branch block. Stress EKG is nondiagnostic for ischemia as a pharmacologic stress test. There are frequent PACs during the stress test. Stress symptoms  included dyspnea. 2. The Perfusion images reveal the left ventricle to be mildly dilated at 132 mL both in rest and stress images. There is a  moderate-sized inferior and inferoapical scar with very mild peri-infarct ischemia noted especially towards the apex. Left ventricle systolic function was moderate to severely depressed at 36%. 3. This is an intermediate risk study, clinical correlation recommended.  Assessment:     ICD-10-CM   1. PAD (peripheral artery disease) (HCC)  I73.9 PCV LOWER ARTERIAL (BILATERAL)    CMP14+EGFR  2. Orthostatic hypotension  I95.1   3. Atherosclerosis of native coronary artery of native heart without angina pectoris  I25.10   4. Elevated liver enzymes  R74.8 CMP14+EGFR    EKG 12/06/2018: Normal sinus rhythm at 69 bpm, left axis deviation, left anterior fasicular block, RBBB. Nonspecific T wave abnormality. No changes compared to EKG 06/24/2018.  Recommendations:   I had asked patient to come in to see me today in view of reported discoloration to toenail.  He has bilateral area of discoloration just below his toenail that is also tender.  I doubt ischemic changes; however, he does have PAD with small vessel disease by previous PV angiogram in 2018.  Claudication symptoms are overall stable and also likely complicated by diabetic peripheral neuropathy.  I will repeat lower extremity arterial duplex.  He is currently on Pletal, will discontinue and start low-dose Xarelto along with aspirin therapy.  I had resumed his Crestor at his last office visit due to his significant CV risk factors and liver enzymes had essentially normalized at discharge from hospital.  I will perform CMP today for follow-up.  He has not had further abdominal pain or issues with constipation/diarrhea since discharge.  He is noted to be orthostatic today, not wearing support stockings.  Symptoms improved on days that he is wearing support stockings.  I suspect orthostatic hypotension related to diabetic peripheral neuropathy.  Advised him to wear support stockings daily, but to take off when lying down or sitting with legs elevated.  We  will continue with his current medications as prescribed, standing blood pressure is stable.  Has slight supine hypertension.  No symptoms of angina or clinical evidence of heart failure.  I will see him back in 8 weeks after lower extremity duplex for follow-up.   *I have discussed this case with Dr. Einar Gip and he personally examined the patient and participated in formulating the plan.*   Miquel Dunn, MSN, APRN, FNP-C [facility]. [place] Office: [phone] Fax: [phone]

## 2019-03-22 LAB — CMP14+EGFR
ALT: 16 IU/L (ref 0–44)
AST: 16 IU/L (ref 0–40)
Albumin/Globulin Ratio: 1.4 (ref 1.2–2.2)
Albumin: 4.1 g/dL (ref 3.6–4.6)
Alkaline Phosphatase: 83 IU/L (ref 39–117)
BUN/Creatinine Ratio: 17 (ref 10–24)
BUN: 30 mg/dL — ABNORMAL HIGH (ref 8–27)
Bilirubin Total: 0.3 mg/dL (ref 0.0–1.2)
CO2: 21 mmol/L (ref 20–29)
Calcium: 9.8 mg/dL (ref 8.6–10.2)
Chloride: 107 mmol/L — ABNORMAL HIGH (ref 96–106)
Creatinine, Ser: 1.74 mg/dL — ABNORMAL HIGH (ref 0.76–1.27)
GFR calc Af Amer: 41 mL/min/{1.73_m2} — ABNORMAL LOW (ref >59)
GFR calc non Af Amer: 35 mL/min/{1.73_m2} — ABNORMAL LOW (ref >59)
Globulin, Total: 3 g/dL (ref 1.5–4.5)
Glucose: 183 mg/dL — ABNORMAL HIGH (ref 65–99)
Potassium: 4.1 mmol/L (ref 3.5–5.2)
Sodium: 141 mmol/L (ref 134–144)
Total Protein: 7.1 g/dL (ref 6.0–8.5)

## 2019-04-06 NOTE — Progress Notes (Signed)
[facility] Note  04/07/2019     CHIEF COMPLAINT Patient presents for Retina Follow Up   HISTORY OF PRESENT ILLNESS: Justin [name] is a 85 y.o. male who presents to the clinic today for:   HPI    Retina Follow Up    Patient presents with  Diabetic Retinopathy.  In both eyes.  Since onset it is stable.  I, the attending physician,  performed the HPI with the patient and updated documentation appropriately.          Comments    BS: 85 this AM Last HgA1c: unknown Patient states his vision is stable but is having lots of new floaters in both eyes and spider webs in both eyes.  Patient denies eye pain but does have a lot of pain in back of head.       Last edited by [name], MD on 04/08/2019  1:07 AM. (History)    pt states he feels like his vision in his left eye is worse, he states he feels like there is a film over his eye, he states his eyes have been itching and burning, and he's been getting headaches, he states his blood sugar has been fine, but he does not check his blood pressure  Referring physician: [name], MD [address] [place],  [place] [address]  HISTORICAL INFORMATION:   Selected notes from the MEDICAL RECORD NUMBER Referred by Dr. [name] for concern of mac edema OU LEE:  Ocular Hx-pseudo OU; formerly followed at [facility] Dr. [name]. S/p focal laser OS x2, history of IVK and IVT OU PMH-anxiety, arthritis, CAD, DM (taking lantus and januvia), HTN, high cholesterol, prostate cancer    CURRENT MEDICATIONS: No current outpatient medications on file. (Ophthalmic Drugs)   No current facility-administered medications for this visit.  (Ophthalmic Drugs)   Current Outpatient Medications (Other)  Medication Sig  . acetaminophen (TYLENOL) 500 MG tablet Take 500 mg by mouth daily as needed for mild pain.  Marland Kitchen AMITIZA 24 MCG capsule Take 24 mcg by mouth as needed for constipation.    Marland Kitchen aspirin EC 81 MG tablet Take 81 mg by mouth daily.  . chlorthalidone (HYGROTON) 25 MG tablet Take 25 mg by mouth daily.  . clonazePAM (KLONOPIN) 0.5 MG tablet Take 0.5 mg by mouth daily as needed for anxiety.  . isosorbide mononitrate (IMDUR) 60 MG 24 hr tablet Take 1 tablet (60 mg total) by mouth daily.  Marland Kitchen JANUVIA 100 MG tablet Take 100 mg by mouth daily.   Marland Kitchen LANTUS SOLOSTAR 100 UNIT/ML Solostar Pen Inject 15-50 Units into the skin 2 (two) times daily. 50 in the morning 15 in the evening  . metoprolol (LOPRESSOR) 50 MG tablet Take 50 mg by mouth 2 (two) times daily.  . nitroGLYCERIN (NITROSTAT) 0.4 MG SL tablet Place 1 tablet (0.4 mg total) under the tongue every 5 (five) minutes as needed for chest pain.  Marland Kitchen omeprazole (PRILOSEC) 40 MG capsule Take by mouth daily.   . rivaroxaban (XARELTO) 2.5 MG TABS tablet Take 1 tablet (2.5 mg total) by mouth 2 (two) times daily.  . rosuvastatin (CRESTOR) 20 MG tablet Take 1 tablet (20 mg total) by mouth daily.  . tamsulosin (FLOMAX) 0.4 MG CAPS capsule Take 0.4 mg by mouth daily.  . vitamin B-12 (CYANOCOBALAMIN) 1000 MCG tablet Take 1,000 mcg by mouth daily.  . vitamin B-6 (PYRIDOXINE) 25 MG tablet Take 25 mg by mouth daily.  Current Facility-Administered Medications (Other)  Medication Route  . Bevacizumab (AVASTIN) SOLN 1.25 mg Intravitreal  . Bevacizumab (AVASTIN) SOLN 1.25 mg Intravitreal  . Bevacizumab (AVASTIN) SOLN 1.25 mg Intravitreal  . Bevacizumab (AVASTIN) SOLN 1.25 mg Intravitreal      REVIEW OF SYSTEMS: ROS    Positive for: Gastrointestinal, Genitourinary, Endocrine, Cardiovascular, Eyes   Negative for: Constitutional, Neurological, Skin, Musculoskeletal, HENT, Respiratory, Psychiatric, Allergic/Imm, Heme/Lymph   Last edited by Doneen Poisson on 04/07/2019  9:06 AM. (History)       ALLERGIES Allergies  Allergen Reactions  . Dye Fdc Red [Red Dye] Swelling and Other (See Comments)    CAT scan dye  . Ibuprofen Other (See  Comments)    Upset stomach     PAST MEDICAL HISTORY Past Medical History:  Diagnosis Date  . Anxiety   . Arthritis   . Coronary artery disease   . Diabetes mellitus without complication ([place])   . Diabetic retinopathy ([place])    NPDR OU  . Diverticulitis   . Dyspnea   . GERD (gastroesophageal reflux disease)   . Hypercholesteremia   . Hypertension   . Hypertensive retinopathy    OU  . Pneumonia    as a child  . Prostate cancer ([place])   . UTI (lower urinary tract infection)    Past Surgical History:  Procedure Laterality Date  . ABDOMINAL SURGERY     for diverticulitis; also removed appendix  . CATARACT EXTRACTION    . CATARACT EXTRACTION, BILATERAL    . CHOLECYSTECTOMY N/A 10/12/2017   Procedure: LAPAROSCOPIC CHOLECYSTECTOMY;  Surgeon: [name], MD;  Location: [facility];  Service: General;  Laterality: N/A;  . CORONARY ARTERY BYPASS GRAFT  2009  . ERCP N/A 12/21/2018   Procedure: ENDOSCOPIC RETROGRADE CHOLANGIOPANCREATOGRAPHY (ERCP);  Surgeon: [name], MD;  Location: [facility];  Service: Endoscopy;  Laterality: N/A;  . ESOPHAGOGASTRODUODENOSCOPY (EGD) WITH PROPOFOL N/A 12/17/2018   Procedure: ESOPHAGOGASTRODUODENOSCOPY (EGD) WITH PROPOFOL;  Surgeon: [name], MD;  Location: [facility];  Service: Endoscopy;  Laterality: N/A;  . EUS Left 12/17/2018   Procedure: UPPER ENDOSCOPIC ULTRASOUND (EUS) LINEAR;  Surgeon: [name], MD;  Location: [facility];  Service: Endoscopy;  Laterality: Left;  . EYE SURGERY     "for bleeding in eye"  . HERNIA REPAIR    . LEFT HEART CATH AND CORONARY ANGIOGRAPHY N/A 11/04/2016   Procedure: Left Heart Cath and Coronary Angiography;  Surgeon: [name], MD;  Location: [facility];  Service: Cardiovascular;  Laterality: N/A;  . LOWER EXTREMITY ANGIOGRAPHY N/A 11/04/2016   Procedure: Lower Extremity Angiography;  Surgeon: [name], MD;  Location: [facility];  Service: Cardiovascular;  Laterality: N/A;  . PROSTATE  SURGERY    . REMOVAL OF STONES  12/21/2018   Procedure: REMOVAL OF STONES;  Surgeon: [name], MD;  Location: [facility];  Service: Endoscopy;;  . [name]  12/21/2018   Procedure: [name];  Surgeon: [name], MD;  Location: [facility] ENDOSCOPY;  Service: Endoscopy;;    FAMILY HISTORY Family History  Problem Relation Age of Onset  . Diabetes Father   . Diabetes Maternal Aunt   . Diabetes Maternal Uncle   . Diabetes Maternal Grandmother   . Heart disease Sister   . Diabetes Brother   . Heart disease Sister     SOCIAL HISTORY Social History   Tobacco Use  . Smoking status: Former Smoker    Years: 2.00  . Smokeless tobacco: Never Used  . Tobacco comment:  when he was a teenage age [age]  Substance Use Topics  . Alcohol use: No  . Drug use: No         OPHTHALMIC EXAM:  Base Eye Exam    Visual Acuity (Snellen - Linear)      Right Left   Dist cc 20/30 20/40   Dist ph cc NI 20/30 -1       Tonometry (Tonopen, 9:09 AM)      Right Left   Pressure 15 12       Pupils      Dark Light Shape React APD   Right 2 1 Round Brisk 0   Left 2 1 Round Brisk 0       Extraocular Movement      Right Left    Full Full       Neuro/Psych    Oriented x3: Yes   Mood/Affect: Normal       Dilation    Both eyes: 1.0% Mydriacyl, 2.5% Phenylephrine @ 9:09 AM        Slit Lamp and Fundus Exam    Slit Lamp Exam      Right Left   Lids/Lashes Dermatochalasis - upper lid, Meibomian gland dysfunction Dermatochalasis - upper lid, Meibomian gland dysfunction   Conjunctiva/Sclera Melanosis Melanosis   Cornea Arcus, 1+ Punctate epithelial erosions Arcus, 1+ Punctate epithelial erosions   Anterior Chamber Deep and quiet Deep and quiet   Iris Mild Temporal Iris atrophy, Round and dilated, No NVI Mild Temporal Iris atrophy, Round and dilated, No NVI   Lens PC IOL in good position with mild aterior capsule fimosis PC IOL in good position with mild aterior capsule fimosis    Vitreous Vitreous syneresis, Posterior vitreous detachment, vitreous condensations, old heme clearing and settling inferiorly Vitreous syneresis, refractile deposits in posterior hyaloid, mild Asteroid hyalosis, new focal VH vertically elongated inferiorly       Fundus Exam      Right Left   Disc Pink and Sharp, Peripapillary atrophy pink and shap, Tilted disc, mild temporal Temporal Peripapillary atrophy   C/D Ratio 0.2 0.3   Macula Blunted foveal reflex, Retinal pigment epithelial mottling and clumping, Microaneurysms -- improved, persistent central cystic changes -- slightly improved Blunted foveal reflex, focal area of RPE atrophy SN to fovea, Microaneurysms, Retinal pigment epithelial mottling, refractile Epiretinal membrane, persistent central cystic changes -- slightly worse   Vessels Vascular attenuation, Tortuous, AV crossing changes Tortuous, Vascular attenuation   Periphery Attached, scattered MA Attached, scattered IRH        Refraction    Wearing Rx      Sphere Cylinder Axis Add   Right +0.25 +0.50 010 +3.00   Left [place] +0.75 173 +3.00   Type: Bifocal          IMAGING AND PROCEDURES  Imaging and Procedures for _0 @  OCT, Retina - OU - Both Eyes       Right Eye Quality was good. Central Foveal Thickness: 356. Progression has improved. Findings include abnormal foveal contour, intraretinal fluid, no SRF, retinal drusen , outer retinal atrophy (interval improvement in IRF).   Left Eye Quality was good. Central Foveal Thickness: 318. Progression has worsened. Findings include abnormal foveal contour, no SRF, intraretinal fluid, outer retinal atrophy, retinal drusen  (Vitreous opacities, interval increase in central IRF).   Notes *Images captured and stored on drive  Diagnosis / Impression:  DME OU, OD>OS OD - interval improvement in IRF OS - persistent IRF; interval increase in central  IRF  Clinical management:  See below  Abbreviations: NFP - Normal  foveal profile. CME - cystoid macular edema. PED - pigment epithelial detachment. IRF - intraretinal fluid. SRF - subretinal fluid. EZ - ellipsoid zone. ERM - epiretinal membrane. ORA - outer retinal atrophy. ORT - outer retinal tubulation. SRHM - subretinal hyper-reflective material         Intravitreal Injection, Pharmacologic Agent - OS - Left Eye       Time Out 04/07/2019. 10:18 AM. Confirmed correct patient, procedure, site, and patient consented.   Anesthesia Topical anesthesia was used. Anesthetic medications included Lidocaine 2%, Proparacaine 0.5%.   Procedure Preparation included 5% betadine to ocular surface, eyelid speculum. A 30 gauge needle was used.   Injection:  1.25 mg Bevacizumab (AVASTIN) SOLN   NDC: 25427-062-37, Lot: [id number]<\/[id number]>_[id number] , Expiration date: 06/30/2019   Route: Intravitreal, Site: Left Eye, Waste: 0 mL  Post-op Post injection exam found visual acuity of at least counting fingers. The patient tolerated the procedure well. There were no complications. The patient received written and verbal post procedure care education.        Intravitreal Injection, Pharmacologic Agent - OD - Right Eye       Time Out 04/07/2019. 10:15 AM. Confirmed correct patient, procedure, site, and patient consented.   Anesthesia Anesthetic medications included Lidocaine 2%, Proparacaine 0.5%.   Procedure Preparation included 5% betadine to ocular surface, eyelid speculum. A supplied needle was used.   Injection:  1.25 mg Bevacizumab (AVASTIN) SOLN   NDC: 69678-938-10, Lot: [id number]<\/[id number]>_2 , Expiration date: 06/08/2019   Route: Intravitreal, Site: Right Eye, Waste: 0 mL  Post-op Post injection exam found visual acuity of at least counting fingers. The patient tolerated the procedure well. There were no complications. The patient received written and verbal post procedure care education.                 ASSESSMENT/PLAN:    ICD-10-CM   1. Moderate nonproliferative  diabetic retinopathy of both eyes with macular edema associated with type 2 diabetes mellitus (HCC)  W58.0998 Intravitreal Injection, Pharmacologic Agent - OS - Left Eye    Intravitreal Injection, Pharmacologic Agent - OD - Right Eye    Bevacizumab (AVASTIN) SOLN 1.25 mg    Bevacizumab (AVASTIN) SOLN 1.25 mg  2. Retinal edema  H35.81 OCT, Retina - OU - Both Eyes  3. Essential hypertension  I10   4. Hypertensive retinopathy of both eyes  H35.033   5. Pseudophakia of both eyes  Z96.1     1,2. Moderate non-proliferative diabetic retinopathy with edema OU   - moved to [place] from [place], [place] -- history of prior injections OS with Dr. [name]  - records received and reviewed from [facility] [place] (Dr. [name])  - history of focal laser OS x2 and IVK/IVT OU in [place] -- last injection was IVK OS November 2013  - exam scattered IRH, DBH and +macular edema  - OCT shows diabetic macular edema, both eyes, (OD > OS)  - S/P IVA OD #1 (09.17.19), #2 (10.29.19), #3 (11.26.19), #4 (01.02.20), #5 (01.30.20), #6 (02.28.20), #7 (04.16.20), #8 (06.11.20)  - S/P IVA OS #1 (10.01.19), #2 (10.29.19), #3 (11.26.19), #4 (01.02.20), #5 (01.30.20), #6 (02.28.20), #7 (04.16.20), #8 (05.14.20), #9 (06.11.20)  - S/P IVK OD #1 (05.14.20) -- no significant improvement  - FA (10.1.19) showed late leaking MA OU -- no NV  - delayed f/u on 4.16.20 due to hospitalization for sepsis / gallstones  - OCT shows interval improvement in IRF  OD; interval increase in central IRF OS  - BCVA stable at 20/30 OU  - switched back to Avastin OD (#9 6.11.2020)  - recommend IVA OS #10, 07.09.20  - pt wishes to proceed  - RBA of procedure discussed, questions answered  - informed consent obtained and signed  - see procedure note  - [employer] benefits investigation initiated 1.31.2020 -- not approved  - f/u next week for PRP OD  3,4. Hypertensive retinopathy OU  - discussed importance of tight BP control  - monitor  5.  Pseudophakia OU  - s/p CE/IOL OU in [place], [place]  - beautiful surgeries, doing well  - monitor   Ophthalmic Meds Ordered this visit:  Meds ordered this encounter  Medications  . Bevacizumab (AVASTIN) SOLN 1.25 mg  . Bevacizumab (AVASTIN) SOLN 1.25 mg       Return for next Tues/Weds, laser OD.  There are no Patient Instructions on file for this visit.   Explained the diagnoses, plan, and follow up with the patient and they expressed understanding.  Patient expressed understanding of the importance of proper follow up care.   This document serves as a record of services personally performed by Gardiner Sleeper, MD, PhD. It was created on their behalf by Ernest Mallick, OA, an ophthalmic assistant. The creation of this record is the provider's dictation and/or activities during the visit.    Electronically signed by: Ernest Mallick, OA  07.08.2020 1:13 AM    Gardiner Sleeper, M.D., Ph.D. Diseases & Surgery of the Retina and Vitreous [facility]  I have reviewed the above documentation for accuracy and completeness, and I agree with the above. Gardiner Sleeper, M.D., Ph.D. 04/08/19 1:13 AM    Abbreviations: M myopia (nearsighted); A astigmatism; H hyperopia (farsighted); P presbyopia; Mrx spectacle prescription;  CTL contact lenses; OD right eye; OS left eye; OU both eyes  XT exotropia; ET esotropia; PEK punctate epithelial keratitis; PEE punctate epithelial erosions; DES dry eye syndrome; MGD meibomian gland dysfunction; ATs artificial tears; PFAT's preservative free artificial tears; [place] nuclear sclerotic cataract; PSC posterior subcapsular cataract; ERM epi-retinal membrane; PVD posterior vitreous detachment; RD retinal detachment; DM diabetes mellitus; DR diabetic retinopathy; NPDR non-proliferative diabetic retinopathy; PDR proliferative diabetic retinopathy; CSME clinically significant macular edema; DME diabetic macular edema; dbh dot blot hemorrhages; CWS  cotton wool spot; POAG primary open angle glaucoma; C/D cup-to-disc ratio; HVF humphrey visual field; GVF goldmann visual field; OCT optical coherence tomography; IOP intraocular pressure; BRVO Branch retinal vein occlusion; CRVO central retinal vein occlusion; CRAO central retinal artery occlusion; BRAO branch retinal artery occlusion; RT retinal tear; SB scleral buckle; PPV pars plana vitrectomy; VH Vitreous hemorrhage; PRP panretinal laser photocoagulation; IVK intravitreal kenalog; VMT vitreomacular traction; MH Macular hole;  NVD neovascularization of the disc; NVE neovascularization elsewhere; AREDS age related eye disease study; ARMD age related macular degeneration; POAG primary open angle glaucoma; EBMD epithelial/anterior basement membrane dystrophy; ACIOL anterior chamber intraocular lens; IOL intraocular lens; PCIOL posterior chamber intraocular lens; Phaco/IOL phacoemulsification with intraocular lens placement; [place] photorefractive keratectomy; LASIK laser assisted in situ keratomileusis; HTN hypertension; DM diabetes mellitus; COPD chronic obstructive pulmonary disease

## 2019-04-07 ENCOUNTER — Ambulatory Visit (INDEPENDENT_AMBULATORY_CARE_PROVIDER_SITE_OTHER): Payer: Medicare PPO | Admitting: Ophthalmology

## 2019-04-07 ENCOUNTER — Encounter (INDEPENDENT_AMBULATORY_CARE_PROVIDER_SITE_OTHER): Payer: Self-pay | Admitting: Ophthalmology

## 2019-04-07 ENCOUNTER — Other Ambulatory Visit: Payer: Self-pay

## 2019-04-07 DIAGNOSIS — E113313 Type 2 diabetes mellitus with moderate nonproliferative diabetic retinopathy with macular edema, bilateral: Secondary | ICD-10-CM

## 2019-04-07 DIAGNOSIS — H35033 Hypertensive retinopathy, bilateral: Secondary | ICD-10-CM | POA: Diagnosis not present

## 2019-04-07 DIAGNOSIS — H3581 Retinal edema: Secondary | ICD-10-CM

## 2019-04-07 DIAGNOSIS — I10 Essential (primary) hypertension: Secondary | ICD-10-CM | POA: Diagnosis not present

## 2019-04-07 DIAGNOSIS — Z961 Presence of intraocular lens: Secondary | ICD-10-CM

## 2019-04-08 MED ORDER — BEVACIZUMAB CHEMO INJECTION 1.25MG/0.05ML SYRINGE FOR KALEIDOSCOPE
1.2500 mg | INTRAVITREAL | Status: AC | PRN
  Administered 2019-04-08: 1.25 mg via INTRAVITREAL

## 2019-04-08 MED ORDER — BEVACIZUMAB CHEMO INJECTION 1.25MG/0.05ML SYRINGE FOR KALEIDOSCOPE
1.2500 mg | INTRAVITREAL | Status: AC | PRN
  Administered 2019-04-08: 01:00:00 1.25 mg via INTRAVITREAL

## 2019-04-11 NOTE — Progress Notes (Signed)
[facility] Note  04/12/2019     CHIEF COMPLAINT Patient presents for Retina Follow Up   HISTORY OF PRESENT ILLNESS: Justin [name] is a 85 y.o. male who presents to the clinic today for:   HPI    Retina Follow Up    Patient presents with  Diabetic Retinopathy.  In both eyes.  This started months ago.  Severity is moderate.  Duration of 5 days.  Since onset it is stable.  I, the attending physician,  performed the HPI with the patient and updated documentation appropriately.          Comments    85 y/o male pt here for 5 day f/u for NPDR OU.  Here for laser OD.  VA OU may be a little better.  No longer seeing "spiderwebs" OS.  Denies pain, flashes, floaters.  No gtts.  BS 128 this a.m.  A1C 5.6.       Last edited by [name], MD on 04/12/2019  8:29 AM. (History)    pt here for PRP OD   Referring physician: [name], MD [id number] [place],  [place] [address]  HISTORICAL INFORMATION:   Selected notes from the MEDICAL RECORD NUMBER Referred by Dr. [name] for concern of mac edema OU LEE:  Ocular Hx-pseudo OU; formerly followed at [facility]) Dr. [name]. S/p focal laser OS x2, history of IVK and IVT OU PMH-anxiety, arthritis, CAD, DM (taking lantus and januvia), HTN, high cholesterol, prostate cancer    CURRENT MEDICATIONS: No current outpatient medications on file. (Ophthalmic Drugs)   No current facility-administered medications for this visit.  (Ophthalmic Drugs)   Current Outpatient Medications (Other)  Medication Sig  . ACCU-CHEK SMARTVIEW test strip   . acetaminophen (TYLENOL) 500 MG tablet Take 500 mg by mouth daily as needed for mild pain.  Marland Kitchen AMITIZA 24 MCG capsule Take 24 mcg by mouth as needed for constipation.   Marland Kitchen aspirin EC 81 MG tablet Take 81 mg by mouth daily.  . chlorthalidone (HYGROTON) 25 MG tablet Take 25 mg by mouth daily.  . cilostazol (PLETAL) 100 MG tablet Take  100 mg by mouth 2 (two) times daily.  . clonazePAM (KLONOPIN) 0.5 MG tablet Take 0.5 mg by mouth daily as needed for anxiety.  . isosorbide mononitrate (IMDUR) 60 MG 24 hr tablet Take 1 tablet (60 mg total) by mouth daily.  Marland Kitchen JANUVIA 100 MG tablet Take 100 mg by mouth daily.   Marland Kitchen LANTUS SOLOSTAR 100 UNIT/ML Solostar Pen Inject 15-50 Units into the skin 2 (two) times daily. 50 in the morning 15 in the evening  . metoprolol (LOPRESSOR) 50 MG tablet Take 50 mg by mouth 2 (two) times daily.  . nitroGLYCERIN (NITROSTAT) 0.4 MG SL tablet Place 1 tablet (0.4 mg total) under the tongue every 5 (five) minutes as needed for chest pain.  Marland Kitchen omeprazole (PRILOSEC) 40 MG capsule Take by mouth daily.   . rivaroxaban (XARELTO) 2.5 MG TABS tablet Take 1 tablet (2.5 mg total) by mouth 2 (two) times daily.  . rosuvastatin (CRESTOR) 20 MG tablet Take 1 tablet (20 mg total) by mouth daily.  . tamsulosin (FLOMAX) 0.4 MG CAPS capsule Take 0.4 mg by mouth daily.  . vitamin B-12 (CYANOCOBALAMIN) 1000 MCG tablet Take 1,000 mcg by mouth daily.  . vitamin B-6 (PYRIDOXINE) 25 MG tablet Take 25 mg by mouth daily.   Current Facility-Administered Medications (Other)  Medication Route  .  Bevacizumab (AVASTIN) SOLN 1.25 mg Intravitreal  . Bevacizumab (AVASTIN) SOLN 1.25 mg Intravitreal  . Bevacizumab (AVASTIN) SOLN 1.25 mg Intravitreal  . Bevacizumab (AVASTIN) SOLN 1.25 mg Intravitreal      REVIEW OF SYSTEMS: ROS    Positive for: Gastrointestinal, Endocrine, Cardiovascular, Eyes   Negative for: Constitutional, Neurological, Skin, Genitourinary, Musculoskeletal, HENT, Respiratory, Psychiatric, Allergic/Imm, Heme/Lymph   Last edited by Matthew Folks, COA on 04/12/2019  8:21 AM. (History)       ALLERGIES Allergies  Allergen Reactions  . Dye Fdc Red [Red Dye] Swelling and Other (See Comments)    CAT scan dye  . Ibuprofen Other (See Comments)    Upset stomach     PAST MEDICAL HISTORY Past Medical History:   Diagnosis Date  . Anxiety   . Arthritis   . Coronary artery disease   . Diabetes mellitus without complication ([place])   . Diabetic retinopathy ([place])    NPDR OU  . Diverticulitis   . Dyspnea   . GERD (gastroesophageal reflux disease)   . Hypercholesteremia   . Hypertension   . Hypertensive retinopathy    OU  . Pneumonia    as a child  . Prostate cancer ([place])   . UTI (lower urinary tract infection)    Past Surgical History:  Procedure Laterality Date  . ABDOMINAL SURGERY     for diverticulitis; also removed appendix  . CATARACT EXTRACTION    . CATARACT EXTRACTION, BILATERAL    . CHOLECYSTECTOMY N/A 10/12/2017   Procedure: LAPAROSCOPIC CHOLECYSTECTOMY;  Surgeon: [name], MD;  Location: [place];  Service: General;  Laterality: N/A;  . CORONARY ARTERY BYPASS GRAFT  2009  . ERCP N/A 12/21/2018   Procedure: ENDOSCOPIC RETROGRADE CHOLANGIOPANCREATOGRAPHY (ERCP);  Surgeon: [name], MD;  Location: [place];  Service: Endoscopy;  Laterality: N/A;  . ESOPHAGOGASTRODUODENOSCOPY (EGD) WITH PROPOFOL N/A 12/17/2018   Procedure: ESOPHAGOGASTRODUODENOSCOPY (EGD) WITH PROPOFOL;  Surgeon: [name], MD;  Location: [place];  Service: Endoscopy;  Laterality: N/A;  . EUS Left 12/17/2018   Procedure: UPPER ENDOSCOPIC ULTRASOUND (EUS) LINEAR;  Surgeon: [name], MD;  Location: [facility];  Service: Endoscopy;  Laterality: Left;  . EYE SURGERY     "for bleeding in eye"  . HERNIA REPAIR    . LEFT HEART CATH AND CORONARY ANGIOGRAPHY N/A 11/04/2016   Procedure: Left Heart Cath and Coronary Angiography;  Surgeon: [name], MD;  Location: [place];  Service: Cardiovascular;  Laterality: N/A;  . LOWER EXTREMITY ANGIOGRAPHY N/A 11/04/2016   Procedure: Lower Extremity Angiography;  Surgeon: [name], MD;  Location: [facility];  Service: Cardiovascular;  Laterality: N/A;  . PROSTATE SURGERY    . REMOVAL OF STONES  12/21/2018   Procedure: REMOVAL OF STONES;   Surgeon: [name], MD;  Location: [employer] [facility];  Service: Endoscopy;;  . [name]  12/21/2018   Procedure: [name];  Surgeon: [name], MD;  Location[facility];  Service: Endoscopy;;    FAMILY HISTORY Family History  Problem Relation Age of Onset  . Diabetes Father   . Diabetes Maternal Aunt   . Diabetes Maternal Uncle   . Diabetes Maternal Grandmother   . Heart disease Sister   . Diabetes Brother   . Heart disease Sister     SOCIAL HISTORY Social History   Tobacco Use  . Smoking status: Former Smoker    Years: 2.00  . Smokeless tobacco: Never Used  . Tobacco comment: when he was a teenage age [age]  Substance Use Topics  . Alcohol use: No  . Drug use: No         OPHTHALMIC EXAM:  Base Eye Exam    Visual Acuity (Snellen - Linear)      Right Left   Dist cc 20/30 + 20/40 +   Dist ph cc NI NI   Correction: Glasses       Tonometry (Tonopen, 8:22 AM)      Right Left   Pressure 13 13       Pupils      Dark Light Shape React APD   Right 3 2 Round Brisk None   Left 3 2 Round Brisk None       Visual Fields (Counting fingers)      Left Right    Full Full       Extraocular Movement      Right Left    Full, Ortho Full, Ortho       Neuro/Psych    Oriented x3: Yes   Mood/Affect: Normal       Dilation    Both eyes: 1.0% Mydriacyl, 2.5% Phenylephrine @ 8:22 AM          IMAGING AND PROCEDURES  Imaging and Procedures for _0 @           ASSESSMENT/PLAN:    ICD-10-CM   1. Moderate nonproliferative diabetic retinopathy of both eyes with macular edema associated with type 2 diabetes mellitus ([place])  H21.9758 Panretinal Photocoagulation - OD - Right Eye  2. Retinal edema  H35.81 OCT, Retina - OU - Both Eyes  3. Essential hypertension  I10   4. Hypertensive retinopathy of both eyes  H35.033   5. Pseudophakia of both eyes  Z96.1     1,2. Moderate non-proliferative diabetic retinopathy with edema OU   - moved to  [place] from [place], [place] -- history of prior injections OS with Dr. [name]  - records received and reviewed from [facility] of [place] (Dr. [name])  - history of focal laser OS x2 and IVK/IVT OU in [place] -- last injection was IVK OS November 2013  - exam scattered IRH, DBH and +macular edema  - OCT shows diabetic macular edema, both eyes, (OD > OS)  - S/P IVA OD #1 (09.17.19), #2 (10.29.19), #3 (11.26.19), #4 (01.02.20), #5 (01.30.20), #6 (02.28.20), #7 (04.16.20), #8 (06.11.20)  - S/P IVA OS #1 (10.01.19), #2 (10.29.19), #3 (11.26.19), #4 (01.02.20), #5 (01.30.20), #6 (02.28.20), #7 (04.16.20), #8 (05.14.20), #9 (06.11.20), #10 (07.09.20)  - S/P IVK OD #1 (05.14.20) -- no significant improvement  - FA (10.1.19) showed late leaking MA OU -- no NV  - delayed f/u on 4.16.20 due to hospitalization for sepsis / gallstones  - OCT shows interval improvement in IRF OD; interval increase in central IRF OS  - BCVA stable at 20/30 OU  - switched back to Avastin OD (#9 6.11.2020)  - recommend PRP OD today, 07.14.20  - pt wishes to proceed  - RBA of procedure discussed, questions answered  - informed consent obtained and signed  - see procedure note  - start PF QID OD x7 days  - [employer] benefits investigation initiated 1.31.2020 -- not approved  - f/u 4 weeks  3,4. Hypertensive retinopathy OU  - discussed importance of tight BP control  - monitor  5. Pseudophakia OU  - s/p CE/IOL OU in [place], [place]  - beautiful surgeries, doing well  - monitor   Ophthalmic Meds Ordered this visit:  No orders of the  defined types were placed in this encounter.      No follow-ups on file.  There are no Patient Instructions on file for this visit.   Explained the diagnoses, plan, and follow up with the patient and they expressed understanding.  Patient expressed understanding of the importance of proper follow up care.   This document serves as a record of services personally performed by  Gardiner Sleeper, MD, PhD. It was created on their behalf by Ernest Mallick, OA, an ophthalmic assistant. The creation of this record is the provider's dictation and/or activities during the visit.    Electronically signed by: Ernest Mallick, OA  07.13.2020 8:40 AM    Gardiner Sleeper, M.D., Ph.D. Diseases & Surgery of the Retina and Vitreous [facility]: M myopia (nearsighted); A astigmatism; H hyperopia (farsighted); P presbyopia; Mrx spectacle prescription;  CTL contact lenses; OD right eye; OS left eye; OU both eyes  XT exotropia; ET esotropia; PEK punctate epithelial keratitis; PEE punctate epithelial erosions; DES dry eye syndrome; MGD meibomian gland dysfunction; ATs artificial tears; PFAT's preservative free artificial tears; [place] nuclear sclerotic cataract; PSC posterior subcapsular cataract; ERM epi-retinal membrane; PVD posterior vitreous detachment; RD retinal detachment; DM diabetes mellitus; DR diabetic retinopathy; NPDR non-proliferative diabetic retinopathy; PDR proliferative diabetic retinopathy; CSME clinically significant macular edema; DME diabetic macular edema; dbh dot blot hemorrhages; CWS cotton wool spot; POAG primary open angle glaucoma; C/D cup-to-disc ratio; HVF humphrey visual field; GVF goldmann visual field; OCT optical coherence tomography; IOP intraocular pressure; BRVO Branch retinal vein occlusion; CRVO central retinal vein occlusion; CRAO central retinal artery occlusion; BRAO branch retinal artery occlusion; RT retinal tear; SB scleral buckle; PPV pars plana vitrectomy; VH Vitreous hemorrhage; PRP panretinal laser photocoagulation; IVK intravitreal kenalog; VMT vitreomacular traction; MH Macular hole;  NVD neovascularization of the disc; NVE neovascularization elsewhere; AREDS age related eye disease study; ARMD age related macular degeneration; POAG primary open angle glaucoma; EBMD epithelial/anterior basement membrane dystrophy;  ACIOL anterior chamber intraocular lens; IOL intraocular lens; PCIOL posterior chamber intraocular lens; Phaco/IOL phacoemulsification with intraocular lens placement; [place] photorefractive keratectomy; LASIK laser assisted in situ keratomileusis; HTN hypertension; DM diabetes mellitus; COPD chronic obstructive pulmonary disease

## 2019-04-12 ENCOUNTER — Encounter (INDEPENDENT_AMBULATORY_CARE_PROVIDER_SITE_OTHER): Payer: Self-pay | Admitting: Ophthalmology

## 2019-04-12 ENCOUNTER — Other Ambulatory Visit: Payer: Self-pay

## 2019-04-12 ENCOUNTER — Ambulatory Visit (INDEPENDENT_AMBULATORY_CARE_PROVIDER_SITE_OTHER): Payer: Medicare PPO | Admitting: Ophthalmology

## 2019-04-12 DIAGNOSIS — H35033 Hypertensive retinopathy, bilateral: Secondary | ICD-10-CM | POA: Diagnosis not present

## 2019-04-12 DIAGNOSIS — Z961 Presence of intraocular lens: Secondary | ICD-10-CM | POA: Diagnosis not present

## 2019-04-12 DIAGNOSIS — E113313 Type 2 diabetes mellitus with moderate nonproliferative diabetic retinopathy with macular edema, bilateral: Secondary | ICD-10-CM | POA: Diagnosis not present

## 2019-04-12 DIAGNOSIS — H3581 Retinal edema: Secondary | ICD-10-CM | POA: Diagnosis not present

## 2019-04-12 DIAGNOSIS — I10 Essential (primary) hypertension: Secondary | ICD-10-CM | POA: Diagnosis not present

## 2019-04-12 MED ORDER — PREDNISOLONE ACETATE 1 % OP SUSP: 1.0000 [drp] | Freq: Four times a day (QID) | OPHTHALMIC | 0 refills | Status: AC

## 2019-04-12 NOTE — Progress Notes (Signed)
[facility] Note  04/12/2019     CHIEF COMPLAINT Patient presents for Retina Follow Up   HISTORY OF PRESENT ILLNESS: Justin [name] is a 85 y.o. male who presents to the clinic today for:   HPI    Retina Follow Up    Patient presents with  Diabetic Retinopathy.  In both eyes.  This started months ago.  Severity is moderate.  Duration of 5 days.  Since onset it is stable.  I, the attending physician,  performed the HPI with the patient and updated documentation appropriately.          Comments    85 y/o male pt here for 5 day f/u for NPDR OU.  Here for laser OD.  VA OU may be a little better.  No longer seeing "spiderwebs" OS.  Denies pain, flashes, floaters.  No gtts.  BS 128 this a.m.  A1C 5.6.       Last edited by [name], MD on 04/12/2019  8:29 AM. (History)    pt here for PRP OD   Referring physician: [name], MD [id number] [place],  [place] [address]  HISTORICAL INFORMATION:   Selected notes from the MEDICAL RECORD NUMBER Referred by Dr. [name] for concern of mac edema OU LEE:  Ocular Hx-pseudo OU; formerly followed at [facility] Dr. [name]. S/p focal laser OS x2, history of IVK and IVT OU PMH-anxiety, arthritis, CAD, DM (taking lantus and [place]), HTN, high cholesterol, prostate cancer    CURRENT MEDICATIONS: Current Outpatient Medications (Ophthalmic Drugs)  Medication Sig  . prednisoLONE acetate (PRED FORTE) 1 % ophthalmic suspension Place 1 drop into the right eye 4 (four) times daily for 7 days.   No current facility-administered medications for this visit.  (Ophthalmic Drugs)   Current Outpatient Medications (Other)  Medication Sig  . ACCU-CHEK SMARTVIEW test strip   . acetaminophen (TYLENOL) 500 MG tablet Take 500 mg by mouth daily as needed for mild pain.  Marland Kitchen AMITIZA 24 MCG capsule Take 24 mcg by mouth as needed for constipation.   Marland Kitchen aspirin EC 81 MG tablet Take  81 mg by mouth daily.  . chlorthalidone (HYGROTON) 25 MG tablet Take 25 mg by mouth daily.  . cilostazol (PLETAL) 100 MG tablet Take 100 mg by mouth 2 (two) times daily.  . clonazePAM (KLONOPIN) 0.5 MG tablet Take 0.5 mg by mouth daily as needed for anxiety.  . isosorbide mononitrate (IMDUR) 60 MG 24 hr tablet Take 1 tablet (60 mg total) by mouth daily.  Marland Kitchen JANUVIA 100 MG tablet Take 100 mg by mouth daily.   Marland Kitchen LANTUS SOLOSTAR 100 UNIT/ML Solostar Pen Inject 15-50 Units into the skin 2 (two) times daily. 50 in the morning 15 in the evening  . metoprolol (LOPRESSOR) 50 MG tablet Take 50 mg by mouth 2 (two) times daily.  . nitroGLYCERIN (NITROSTAT) 0.4 MG SL tablet Place 1 tablet (0.4 mg total) under the tongue every 5 (five) minutes as needed for chest pain.  Marland Kitchen omeprazole (PRILOSEC) 40 MG capsule Take by mouth daily.   . rivaroxaban (XARELTO) 2.5 MG TABS tablet Take 1 tablet (2.5 mg total) by mouth 2 (two) times daily.  . rosuvastatin (CRESTOR) 20 MG tablet Take 1 tablet (20 mg total) by mouth daily.  . tamsulosin (FLOMAX) 0.4 MG CAPS capsule Take 0.4 mg by mouth daily.  . vitamin B-12 (CYANOCOBALAMIN) 1000 MCG tablet Take 1,000 mcg by mouth daily.  Marland Kitchen  vitamin B-6 (PYRIDOXINE) 25 MG tablet Take 25 mg by mouth daily.   Current Facility-Administered Medications (Other)  Medication Route  . Bevacizumab (AVASTIN) SOLN 1.25 mg Intravitreal  . Bevacizumab (AVASTIN) SOLN 1.25 mg Intravitreal  . Bevacizumab (AVASTIN) SOLN 1.25 mg Intravitreal  . Bevacizumab (AVASTIN) SOLN 1.25 mg Intravitreal      REVIEW OF SYSTEMS: ROS    Positive for: Gastrointestinal, Endocrine, Cardiovascular, Eyes   Negative for: Constitutional, Neurological, Skin, Genitourinary, Musculoskeletal, HENT, Respiratory, Psychiatric, Allergic/Imm, Heme/Lymph   Last edited by Matthew Folks, COA on 04/12/2019  8:21 AM. (History)       ALLERGIES Allergies  Allergen Reactions  . Dye Fdc Red [Red Dye] Swelling and Other (See  Comments)    CAT scan dye  . Ibuprofen Other (See Comments)    Upset stomach     PAST MEDICAL HISTORY Past Medical History:  Diagnosis Date  . Anxiety   . Arthritis   . Coronary artery disease   . Diabetes mellitus without complication ([place])   . Diabetic retinopathy ([place])    NPDR OU  . Diverticulitis   . Dyspnea   . GERD (gastroesophageal reflux disease)   . Hypercholesteremia   . Hypertension   . Hypertensive retinopathy    OU  . Pneumonia    as a child  . Prostate cancer ([place])   . UTI (lower urinary tract infection)    Past Surgical History:  Procedure Laterality Date  . ABDOMINAL SURGERY     for diverticulitis; also removed appendix  . CATARACT EXTRACTION    . CATARACT EXTRACTION, BILATERAL    . CHOLECYSTECTOMY N/A 10/12/2017   Procedure: LAPAROSCOPIC CHOLECYSTECTOMY;  Surgeon: [name], MD;  Location: [place];  Service: General;  Laterality: N/A;  . CORONARY ARTERY BYPASS GRAFT  2009  . ERCP N/A 12/21/2018   Procedure: ENDOSCOPIC RETROGRADE CHOLANGIOPANCREATOGRAPHY (ERCP);  Surgeon: [name], MD;  Location: [place];  Service: Endoscopy;  Laterality: N/A;  . ESOPHAGOGASTRODUODENOSCOPY (EGD) WITH PROPOFOL N/A 12/17/2018   Procedure: ESOPHAGOGASTRODUODENOSCOPY (EGD) WITH PROPOFOL;  Surgeon: [name], MD;  Location: [place];  Service: Endoscopy;  Laterality: N/A;  . EUS Left 12/17/2018   Procedure: UPPER ENDOSCOPIC ULTRASOUND (EUS) LINEAR;  Surgeon: [name], MD;  Location: [place];  Service: Endoscopy;  Laterality: Left;  . EYE SURGERY     "for bleeding in eye"  . HERNIA REPAIR    . LEFT HEART CATH AND CORONARY ANGIOGRAPHY N/A 11/04/2016   Procedure: Left Heart Cath and Coronary Angiography;  Surgeon: [name], MD;  Location: [facility];  Service: Cardiovascular;  Laterality: N/A;  . LOWER EXTREMITY ANGIOGRAPHY N/A 11/04/2016   Procedure: Lower Extremity Angiography;  Surgeon: [name], MD;  Location: [place] [facility];   Service: Cardiovascular;  Laterality: N/A;  . PROSTATE SURGERY    . REMOVAL OF STONES  12/21/2018   Procedure: REMOVAL OF STONES;  Surgeon: [name], MD;  Location: [place];  Service: Endoscopy;;  . [name]  12/21/2018   Procedure: [name];  Surgeon: [name], MD;  Location: [facility] ENDOSCOPY;  Service: Endoscopy;;    FAMILY HISTORY Family History  Problem Relation Age of Onset  . Diabetes Father   . Diabetes Maternal Aunt   . Diabetes Maternal Uncle   . Diabetes Maternal Grandmother   . Heart disease Sister   . Diabetes Brother   . Heart disease Sister     SOCIAL HISTORY Social History   Tobacco Use  . Smoking status: Former Smoker  Years: 2.00  . Smokeless tobacco: Never Used  . Tobacco comment: when he was a teenage age [age]  Substance Use Topics  . Alcohol use: No  . Drug use: No         OPHTHALMIC EXAM:  Base Eye Exam    Visual Acuity (Snellen - Linear)      Right Left   Dist cc 20/30 + 20/40 +   Dist ph cc NI NI   Correction: Glasses       Tonometry (Tonopen, 8:22 AM)      Right Left   Pressure 13 13       Pupils      Dark Light Shape React APD   Right 3 2 Round Brisk None   Left 3 2 Round Brisk None       Visual Fields (Counting fingers)      Left Right    Full Full       Extraocular Movement      Right Left    Full, Ortho Full, Ortho       Neuro/Psych    Oriented x3: Yes   Mood/Affect: Normal       Dilation    Both eyes: 1.0% Mydriacyl, 2.5% Phenylephrine @ 8:22 AM        Slit Lamp and Fundus Exam    Slit Lamp Exam      Right Left   Lids/Lashes Dermatochalasis - upper lid, Meibomian gland dysfunction Dermatochalasis - upper lid, Meibomian gland dysfunction   Conjunctiva/Sclera Melanosis Melanosis   Cornea Arcus, 1+ Punctate epithelial erosions Arcus, 1+ Punctate epithelial erosions   Anterior Chamber Deep and quiet Deep and quiet   Iris Mild Temporal Iris atrophy, Round and dilated, No NVI Mild  Temporal Iris atrophy, Round and dilated, No NVI   Lens PC IOL in good position with mild aterior capsule fimosis PC IOL in good position with mild aterior capsule fimosis   Vitreous Vitreous syneresis, Posterior vitreous detachment, vitreous condensations, old heme clearing and settling inferiorly Vitreous syneresis, refractile deposits in posterior hyaloid, mild Asteroid hyalosis, new focal VH vertically elongated inferiorly       Fundus Exam      Right Left   Disc Pink and Sharp, Peripapillary atrophy pink and shap, Tilted disc, mild temporal Temporal Peripapillary atrophy   C/D Ratio 0.2 0.3   Macula Blunted foveal reflex, Retinal pigment epithelial mottling and clumping, Microaneurysms -- improved, persistent central cystic changes -- slightly improved Blunted foveal reflex, focal area of RPE atrophy SN to fovea, Microaneurysms, Retinal pigment epithelial mottling, refractile Epiretinal membrane, persistent central cystic changes -- slightly worse   Vessels Vascular attenuation, Tortuous, AV crossing changes Tortuous, Vascular attenuation   Periphery Attached, scattered MA Attached, scattered IRH          IMAGING AND PROCEDURES  Imaging and Procedures for _0 @  OCT, Retina - OU - Both Eyes       Right Eye Quality was good. Central Foveal Thickness: 352. Progression has been stable. Findings include abnormal foveal contour, intraretinal fluid, no SRF, retinal drusen , outer retinal atrophy (Persistent IRF).   Left Eye Quality was good. Central Foveal Thickness: 303. Progression has worsened. Findings include abnormal foveal contour, no SRF, intraretinal fluid, outer retinal atrophy, retinal drusen  (Mild interval improvement in IRF and vitreous opacities).   Notes *Images captured and stored on drive  Diagnosis / Impression:  DME OU, OD>OS OD - persistent IRF OS - Mild interval improvement in IRF and vitreous  opacities  Clinical management:  See below  Abbreviations:  NFP - Normal foveal profile. CME - cystoid macular edema. PED - pigment epithelial detachment. IRF - intraretinal fluid. SRF - subretinal fluid. EZ - ellipsoid zone. ERM - epiretinal membrane. ORA - outer retinal atrophy. ORT - outer retinal tubulation. SRHM - subretinal hyper-reflective material         Panretinal Photocoagulation - OD - Right Eye       LASER PROCEDURE NOTE  Diagnosis:   Moderate Non-Proliferative Diabetic Retinopathy w/ peripheral capillary nonperfusion, RIGHT EYE  Procedure:  Pan-retinal photocoagulation using slit lamp laser, RIGHT EYE  Anesthesia:  Topical  Surgeon: [name], MD, PhD   Informed consent obtained, operative eye marked, and time out performed prior to initiation of laser.   Lumenis [id number] slit lamp laser Pattern: 3x3 square Power: 300 mW Duration: 30 msec  Spot size: 200 microns  # spots: 2992 spots  Complications: None.  Notes: capsular phimosis view and preventing laser up take peripherally and scattered focal areas  RTC: 4 wks  Patient tolerated the procedure well and received written and verbal post-procedure care information/education.                  ASSESSMENT/PLAN:    ICD-10-CM   1. Moderate nonproliferative diabetic retinopathy of both eyes with macular edema associated with type 2 diabetes mellitus ([place])  E26.8341 Panretinal Photocoagulation - OD - Right Eye  2. Retinal edema  H35.81 OCT, Retina - OU - Both Eyes  3. Essential hypertension  I10   4. Hypertensive retinopathy of both eyes  H35.033   5. Pseudophakia of both eyes  Z96.1     1,2. Moderate non-proliferative diabetic retinopathy with edema OU   - moved to [place] from [place], [place] -- history of prior injections OS with Dr. [name]  - records received and reviewed from [facility] (Dr. [name])  - history of focal laser OS x2 and IVK/IVT OU in [place] -- last injection was IVK OS November 2013  - exam scattered IRH, DBH and  +macular edema  - OCT shows diabetic macular edema, both eyes, (OD > OS)  - S/P IVA OD #1 (09.17.19), #2 (10.29.19), #3 (11.26.19), #4 (01.02.20), #5 (01.30.20), #6 (02.28.20), #7 (04.16.20), #8 (06.11.20)  - S/P IVA OS #1 (10.01.19), #2 (10.29.19), #3 (11.26.19), #4 (01.02.20), #5 (01.30.20), #6 (02.28.20), #7 (04.16.20), #8 (05.14.20), #9 (06.11.20), #10 (07.09.20)  - S/P IVK OD #1 (05.14.20) -- no significant improvement  - FA (10.1.19) showed late leaking MA OU -- no NV  - delayed f/u on 4.16.20 due to hospitalization for sepsis / gallstones  - OCT shows interval improvement in IRF OD; interval increase in central IRF OS  - BCVA stable at 20/30 OU  - switched back to Avastin OD (#9 6.11.2020)  - recommend PRP OD today, 07.14.20  - pt wishes to proceed  - RBA of procedure discussed, questions answered  - informed consent obtained and signed  - see procedure note  - start PF QID OD x7 days  - Eyle[employer] benefits investigation initiated 1.31.2020 -- not approved  - f/u 4 weeks  3,4. Hypertensive retinopathy OU  - discussed importance of tight BP control  - monitor  5. Pseudophakia OU  - s/p CE/IOL OU in [place], [place]  - beautiful surgeries, doing well  - monitor   Ophthalmic Meds Ordered this visit:  Meds ordered this encounter  Medications  . prednisoLONE acetate (PRED FORTE) 1 % ophthalmic suspension  Sig: Place 1 drop into the right eye 4 (four) times daily for 7 days.    Dispense:  10 mL    Refill:  0       Return in about 4 weeks (around 05/10/2019) for Dilated Exam, OCT, Possible Injxn.  There are no Patient Instructions on file for this visit.   Explained the diagnoses, plan, and follow up with the patient and they expressed understanding.  Patient expressed understanding of the importance of proper follow up care.   This document serves as a record of services personally performed by Gardiner Sleeper, MD, PhD. It was created on their behalf by Ernest Mallick, OA, an  ophthalmic assistant. The creation of this record is the provider's dictation and/or activities during the visit.    Electronically signed by: Ernest Mallick, OA  07.13.2020 1:44 PM    Gardiner Sleeper, M.D., Ph.D. Diseases & Surgery of the Retina and Vitreous [facility]  I have reviewed the above documentation for accuracy and completeness, and I agree with the above. Gardiner Sleeper, M.D., Ph.D. 04/12/19 1:45 PM     Abbreviations: M myopia (nearsighted); A astigmatism; H hyperopia (farsighted); P presbyopia; Mrx spectacle prescription;  CTL contact lenses; OD right eye; OS left eye; OU both eyes  XT exotropia; ET esotropia; PEK punctate epithelial keratitis; PEE punctate epithelial erosions; DES dry eye syndrome; MGD meibomian gland dysfunction; ATs artificial tears; PFAT's preservative free artificial tears; [place] nuclear sclerotic cataract; PSC posterior subcapsular cataract; ERM epi-retinal membrane; PVD posterior vitreous detachment; RD retinal detachment; DM diabetes mellitus; DR diabetic retinopathy; NPDR non-proliferative diabetic retinopathy; PDR proliferative diabetic retinopathy; CSME clinically significant macular edema; DME diabetic macular edema; dbh dot blot hemorrhages; CWS cotton wool spot; POAG primary open angle glaucoma; C/D cup-to-disc ratio; HVF humphrey visual field; GVF goldmann visual field; OCT optical coherence tomography; IOP intraocular pressure; BRVO Branch retinal vein occlusion; CRVO central retinal vein occlusion; CRAO central retinal artery occlusion; BRAO branch retinal artery occlusion; RT retinal tear; SB scleral buckle; PPV pars plana vitrectomy; VH Vitreous hemorrhage; PRP panretinal laser photocoagulation; IVK intravitreal kenalog; VMT vitreomacular traction; MH Macular hole;  NVD neovascularization of the disc; NVE neovascularization elsewhere; AREDS age related eye disease study; ARMD age related macular degeneration; POAG primary open  angle glaucoma; EBMD epithelial/anterior basement membrane dystrophy; ACIOL anterior chamber intraocular lens; IOL intraocular lens; PCIOL posterior chamber intraocular lens; Phaco/IOL phacoemulsification with intraocular lens placement; [place] photorefractive keratectomy; LASIK laser assisted in situ keratomileusis; HTN hypertension; DM diabetes mellitus; COPD chronic obstructive pulmonary disease

## 2019-04-25 ENCOUNTER — Other Ambulatory Visit: Payer: Self-pay | Admitting: Cardiology

## 2019-05-02 ENCOUNTER — Other Ambulatory Visit: Payer: Self-pay

## 2019-05-02 ENCOUNTER — Ambulatory Visit (INDEPENDENT_AMBULATORY_CARE_PROVIDER_SITE_OTHER): Payer: Medicare PPO

## 2019-05-02 DIAGNOSIS — I739 Peripheral vascular disease, unspecified: Secondary | ICD-10-CM

## 2019-05-10 NOTE — Progress Notes (Signed)
[facility] Note  05/11/2019     CHIEF COMPLAINT Patient presents for Retina Follow Up   HISTORY OF PRESENT ILLNESS: Justin [name] is a 85 y.o. male who presents to the clinic today for:   HPI    Retina Follow Up    Patient presents with  Diabetic Retinopathy.  In both eyes.  This started weeks ago.  Severity is moderate.  Duration of weeks.  Since onset it is stable.  I, the attending physician,  performed the HPI with the patient and updated documentation appropriately.          Comments    BS: 89 this AM HgA1c: 5.6 Patient states his vision is stable.  He complains of more floaters in his right eye since last visit and complains of film over vision OS.  Patient denies eye pain or discomfort and denies flashes of light OU.       Last edited by [name], MD on 05/11/2019  9:12 AM. (History)    pt states his vision is a little blurry and he has some floaters   Referring physician: [name], MD [address] [place],  [place] [address]  HISTORICAL INFORMATION:   Selected notes from the [place] Referred by Dr. [name] for concern of mac edema OU LEE:  Ocular Hx-pseudo OU; formerly followed at [facility] Dr. [name]. S/p focal laser OS x2, history of IVK and IVT OU PMH-anxiety, arthritis, CAD, DM (taking lantus and januvia), HTN, high cholesterol, prostate cancer    CURRENT MEDICATIONS: No current outpatient medications on file. (Ophthalmic Drugs)   No current facility-administered medications for this visit.  (Ophthalmic Drugs)   Current Outpatient Medications (Other)  Medication Sig  . ACCU-CHEK SMARTVIEW test strip   . acetaminophen (TYLENOL) 500 MG tablet Take 500 mg by mouth daily as needed for mild pain.  Marland Kitchen AMITIZA 24 MCG capsule Take 24 mcg by mouth as needed for constipation.   Marland Kitchen aspirin EC 81 MG tablet Take 81 mg by mouth daily.  . chlorthalidone (HYGROTON) 25  MG tablet Take 25 mg by mouth daily.  . cilostazol (PLETAL) 100 MG tablet Take 100 mg by mouth 2 (two) times daily.  . clonazePAM (KLONOPIN) 0.5 MG tablet Take 0.5 mg by mouth daily as needed for anxiety.  . isosorbide mononitrate (IMDUR) 60 MG 24 hr tablet Take 1 tablet (60 mg total) by mouth daily.  Marland Kitchen JANUVIA 100 MG tablet Take 100 mg by mouth daily.   Marland Kitchen LANTUS SOLOSTAR 100 UNIT/ML Solostar Pen Inject 15-50 Units into the skin 2 (two) times daily. 50 in the morning 15 in the evening  . metoprolol (LOPRESSOR) 50 MG tablet Take 50 mg by mouth 2 (two) times daily.  . nitroGLYCERIN (NITROSTAT) 0.4 MG SL tablet Place 1 tablet (0.4 mg total) under the tongue every 5 (five) minutes as needed for chest pain.  Marland Kitchen omeprazole (PRILOSEC) 40 MG capsule Take by mouth daily.   . rosuvastatin (CRESTOR) 20 MG tablet Take 1 tablet (20 mg total) by mouth daily.  . tamsulosin (FLOMAX) 0.4 MG CAPS capsule Take 0.4 mg by mouth daily.  . vitamin B-12 (CYANOCOBALAMIN) 1000 MCG tablet Take 1,000 mcg by mouth daily.  . vitamin B-6 (PYRIDOXINE) 25 MG tablet Take 25 mg by mouth daily.  [name] 2.5 MG TABS tablet TAKE 1 TABLET TWICE DAILY   Current Facility-Administered Medications (Other)  Medication Route  . Bevacizumab (AVASTIN)  SOLN 1.25 mg Intravitreal  . Bevacizumab (AVASTIN) SOLN 1.25 mg Intravitreal  . Bevacizumab (AVASTIN) SOLN 1.25 mg Intravitreal  . Bevacizumab (AVASTIN) SOLN 1.25 mg Intravitreal      REVIEW OF SYSTEMS: ROS    Positive for: Gastrointestinal, Endocrine, Cardiovascular, Eyes   Negative for: Constitutional, Neurological, Skin, Genitourinary, Musculoskeletal, HENT, Respiratory, Psychiatric, Allergic/Imm, Heme/Lymph   Last edited by Doneen Poisson on 05/11/2019  8:49 AM. (History)       ALLERGIES Allergies  Allergen Reactions  . Dye Fdc Red [Red Dye] Swelling and Other (See Comments)    CAT scan dye  . Ibuprofen Other (See Comments)    Upset stomach     PAST MEDICAL  HISTORY Past Medical History:  Diagnosis Date  . Anxiety   . Arthritis   . Coronary artery disease   . Diabetes mellitus without complication ([place])   . Diabetic retinopathy ([place])    NPDR OU  . Diverticulitis   . Dyspnea   . GERD (gastroesophageal reflux disease)   . Hypercholesteremia   . Hypertension   . Hypertensive retinopathy    OU  . Pneumonia    as a child  . Prostate cancer ([place])   . UTI (lower urinary tract infection)    Past Surgical History:  Procedure Laterality Date  . ABDOMINAL SURGERY     for diverticulitis; also removed appendix  . CATARACT EXTRACTION    . CATARACT EXTRACTION, BILATERAL    . CHOLECYSTECTOMY N/A 10/12/2017   Procedure: LAPAROSCOPIC CHOLECYSTECTOMY;  Surgeon: [name], MD;  Location: [place];  Service: General;  Laterality: N/A;  . CORONARY ARTERY BYPASS GRAFT  2009  . ERCP N/A 12/21/2018   Procedure: ENDOSCOPIC RETROGRADE CHOLANGIOPANCREATOGRAPHY (ERCP);  Surgeon: [name], MD;  Location: [place];  Service: Endoscopy;  Laterality: N/A;  . ESOPHAGOGASTRODUODENOSCOPY (EGD) WITH PROPOFOL N/A 12/17/2018   Procedure: ESOPHAGOGASTRODUODENOSCOPY (EGD) WITH PROPOFOL;  Surgeon: [name], MD;  Location: [place];  Service: Endoscopy;  Laterality: N/A;  . EUS Left 12/17/2018   Procedure: UPPER ENDOSCOPIC ULTRASOUND (EUS) LINEAR;  Surgeon: [name], MD;  Location: [place];  Service: Endoscopy;  Laterality: Left;  . EYE SURGERY     "for bleeding in eye"  . HERNIA REPAIR    . LEFT HEART CATH AND CORONARY ANGIOGRAPHY N/A 11/04/2016   Procedure: Left Heart Cath and Coronary Angiography;  Surgeon: [name], MD;  Location: [place] CV LAB;  Service: Cardiovascular;  Laterality: N/A;  . LOWER EXTREMITY ANGIOGRAPHY N/A 11/04/2016   Procedure: Lower Extremity Angiography;  Surgeon: [name], MD;  Location: [place] CV LAB;  Service: Cardiovascular;  Laterality: N/A;  . PROSTATE SURGERY    . REMOVAL OF STONES  12/21/2018    Procedure: REMOVAL OF STONES;  Surgeon: [name], MD;  Location: [place];  Service: Endoscopy;;  . [name]  12/21/2018   Procedure: [name];  Surgeon: [name], MD;  Location: [facility] END[facility]COPY;  Service: Endoscopy;;    FAMILY HISTORY Family History  Problem Relation Age of Onset  . Diabetes Father   . Diabetes Maternal Aunt   . Diabetes Maternal Uncle   . Diabetes Maternal Grandmother   . Heart disease Sister   . Diabetes Brother   . Heart disease Sister     SOCIAL HISTORY Social History   Tobacco Use  . Smoking status: Former Smoker    Years: 2.00  . Smokeless tobacco: Never Used  . Tobacco comment: when he was a teenage age [age]  Substance Use Topics  .  Alcohol use: No  . Drug use: No         OPHTHALMIC EXAM:  Base Eye Exam    Visual Acuity (Snellen - Linear)      Right Left   Dist cc 20/30 -1 20/40 -1   Dist ph cc NI 20/30 -2   Correction: Glasses       Tonometry (Tonopen, 8:53 AM)      Right Left   Pressure 11 10       Pupils      Dark Light Shape React APD   Right 3 2 Round Minimal 0   Left 3 2 Round Minimal 0       Visual Fields      Left Right    Full Full       Extraocular Movement      Right Left    Full Full       Neuro/Psych    Oriented x3: Yes   Mood/Affect: Normal       Dilation    Both eyes: 1.0% Mydriacyl, 2.5% Phenylephrine @ 8:53 AM        Slit Lamp and Fundus Exam    Slit Lamp Exam      Right Left   Lids/Lashes Dermatochalasis - upper lid, Meibomian gland dysfunction Dermatochalasis - upper lid, Meibomian gland dysfunction   Conjunctiva/Sclera Melanosis Melanosis   Cornea Arcus, 1+ Punctate epithelial erosions Arcus, 1+ Punctate epithelial erosions   Anterior Chamber Deep and quiet Deep and quiet   Iris Mild Temporal Iris atrophy, Round and dilated, No NVI Mild Temporal Iris atrophy, Round and dilated, No NVI   Lens PC IOL in good position with mild aterior capsule fimosis PC IOL in good  position with mild aterior capsule fimosis   Vitreous Vitreous syneresis, Posterior vitreous detachment, vitreous condensations, old heme clearing and settling inferiorly Vitreous syneresis, refractile deposits in posterior hyaloid, mild Asteroid hyalosis, fading focal VH vertically elongated inferiorly       Fundus Exam      Right Left   Disc Pink and Sharp, Peripapillary atrophy pink and shap, Tilted disc, mild temporal Temporal Peripapillary atrophy   C/D Ratio 0.2 0.3   Macula Blunted foveal reflex, Retinal pigment epithelial mottling and clumping, Microaneurysms -- improved, persistent central cystic changes  Blunted foveal reflex, focal area of RPE atrophy SN to fovea, Microaneurysms, Retinal pigment epithelial mottling, refractile Epiretinal membrane, persistent central cystic changes    Vessels Vascular attenuation, Tortuous Tortuous, Vascular attenuation   Periphery Attached, scattered MA, good periperal PRP 360 - room for posterior fill in if needed Attached, scattered IRH        Refraction    Wearing Rx      Sphere Cylinder Axis Add   Right +0.25 +0.50 010 +3.00   Left [place] +0.75 173 +3.00   Type: Bifocal          IMAGING AND PROCEDURES  Imaging and Procedures for _0 @  OCT, Retina - OU - Both Eyes       Right Eye Quality was good. Central Foveal Thickness: 359. Progression has been stable. Findings include abnormal foveal contour, intraretinal fluid, no SRF, retinal drusen , outer retinal atrophy (Persistent IRF - minimal change from prior).   Left Eye Quality was good. Central Foveal Thickness: 302. Progression has been stable. Findings include abnormal foveal contour, no SRF, intraretinal fluid, outer retinal atrophy, retinal drusen  (Persistent IRF - minimal change).   Notes *Images captured and stored on drive  Diagnosis / Impression:  DME OU, OD>OS OD - persistent IRF - minimal change from prior OS - persistent IRF - minimal change from  prior  Clinical management:  See below  Abbreviations: NFP - Normal foveal profile. CME - cystoid macular edema. PED - pigment epithelial detachment. IRF - intraretinal fluid. SRF - subretinal fluid. EZ - ellipsoid zone. ERM - epiretinal membrane. ORA - outer retinal atrophy. ORT - outer retinal tubulation. SRHM - subretinal hyper-reflective material         Intravitreal Injection, Pharmacologic Agent - OD - Right Eye       Time Out 05/11/2019. 9:35 AM. Confirmed correct patient, procedure, site, and patient consented.   Anesthesia Topical anesthesia was used. Anesthetic medications included Lidocaine 2%, Proparacaine 0.5%.   Procedure Preparation included 5% betadine to ocular surface, eyelid speculum. A 30 gauge needle was used.   Injection:  1.25 mg Bevacizumab (AVASTIN) SOLN   NDC: 62130-865-78, Lot: [id number] , Expiration date: 06/30/2019   Route: Intravitreal, Site: Right Eye, Waste: 0 mL  Post-op Post injection exam found visual acuity of at least counting fingers. The patient tolerated the procedure well. There were no complications. The patient received written and verbal post procedure care education.        Intravitreal Injection, Pharmacologic Agent - OS - Left Eye       Time Out 05/11/2019. 9:36 AM. Confirmed correct patient, procedure, site, and patient consented.   Anesthesia Topical anesthesia was used. Anesthetic medications included Lidocaine 2%, Proparacaine 0.5%.   Procedure Preparation included 5% betadine to ocular surface, eyelid speculum. A 30 gauge needle was used.   Injection:  1.25 mg Bevacizumab (AVASTIN) SOLN   NDC: 32951-884-16, Lot: [id number] , Expiration date: 06/24/2019   Route: Intravitreal, Site: Left Eye, Waste: 0 mL  Post-op Post injection exam found visual acuity of at least counting fingers. The patient tolerated the procedure well. There were no complications. The patient received written and verbal post procedure care  education.                 ASSESSMENT/PLAN:    ICD-10-CM   1. Moderate nonproliferative diabetic retinopathy of both eyes with macular edema associated with type 2 diabetes mellitus (HCC)  S06.3016 Intravitreal Injection, Pharmacologic Agent - OD - Right Eye    Intravitreal Injection, Pharmacologic Agent - OS - Left Eye    Bevacizumab (AVASTIN) SOLN 1.25 mg    Bevacizumab (AVASTIN) SOLN 1.25 mg  2. Retinal edema  H35.81 OCT, Retina - OU - Both Eyes  3. Essential hypertension  I10   4. Hypertensive retinopathy of both eyes  H35.033   5. Pseudophakia of both eyes  Z96.1     1,2. Moderate non-proliferative diabetic retinopathy with edema OU   - moved to [place] from [place], [place] -- history of prior injections OS with Dr. [name]  - records received and reviewed from [facility] (Dr. [name])  - history of focal laser OS x2 and IVK/IVT OU in [place] -- last injection was IVK OS November 2013  - initial exam: scattered [place], DBH and +macular edema  - initial OCT: diabetic macular edema, both eyes, (OD > OS)  - FA (10.1.19) showed late leaking MA OU -- no NV  - S/P IVA OD #1 (09.17.19), #2 (10.29.19), #3 (11.26.19), #4 (01.02.20), #5 (01.30.20), #6 (02.28.20), #7 (04.16.20), #8 (06.11.20)  - S/P IVA OS #1 (10.01.19), #2 (10.29.19), #3 (11.26.19), #4 (01.02.20), #5 (01.30.20), #6 (02.28.20), #7 (04.16.20), #8 (05.14.20), #9 (06.11.20), #10 (07.09.20)  -  S/P IVK OD #1 (05.14.20) -- no significant improvement  - switched back to Avastin OD (#9 6.11.2020)  - S/P PRP OD (07.14.20) - good laser surrounding, room for posterior fill in if needed  - delayed f/u on 4.16.20 due to hospitalization for sepsis / gallstones  - OCT shows persistent IRF OU -- ?resistance to IVA  - BCVA stable at 20/30 OU  - recommend IVA OU (#9 OD, #11 OS) today, 08.12.20  - RBA of procedure discussed, questions answered  - informed consent obtained and signed  - see procedure note  - will repeat  Eylea4U benefits investigation due to possible IVA resistance  - Eylea4U benefits investigation re-initiated 8.12.2020 -- 1.2020 not approved by [facility]  - f/u 2-3 weeks, PRP OS  3,4. Hypertensive retinopathy OU  - discussed importance of tight BP control  - monitor  5. Pseudophakia OU  - s/p CE/IOL OU in [place], [place]  - beautiful surgeries, doing well  - monitor   Ophthalmic Meds Ordered this visit:  Meds ordered this encounter  Medications  . Bevacizumab (AVASTIN) SOLN 1.25 mg  . Bevacizumab (AVASTIN) SOLN 1.25 mg       Return for f/u 2-3 weeks, PRP OS.  There are no Patient Instructions on file for this visit.   Explained the diagnoses, plan, and follow up with the patient and they expressed understanding.  Patient expressed understanding of the importance of proper follow up care.   This document serves as a record of services personally performed by Gardiner Sleeper, MD, PhD. It was created on their behalf by Ernest Mallick, OA, an ophthalmic assistant. The creation of this record is the provider's dictation and/or activities during the visit.    Electronically signed by: Ernest Mallick, OA  08.11.2020 9:57 AM   Gardiner Sleeper, M.D., Ph.D. Diseases & Surgery of the Retina and Vitreous [facility]  I have reviewed the above documentation for accuracy and completeness, and I agree with the above. Gardiner Sleeper, M.D., Ph.D. 05/11/19 10:05 AM    Abbreviations: M myopia (nearsighted); A astigmatism; H hyperopia (farsighted); P presbyopia; Mrx spectacle prescription;  CTL contact lenses; OD right eye; OS left eye; OU both eyes  XT exotropia; ET esotropia; PEK punctate epithelial keratitis; PEE punctate epithelial erosions; DES dry eye syndrome; MGD meibomian gland dysfunction; ATs artificial tears; PFAT's preservative free artificial tears; [place] nuclear sclerotic cataract; PSC posterior subcapsular cataract; ERM epi-retinal membrane; PVD posterior  vitreous detachment; RD retinal detachment; DM diabetes mellitus; DR diabetic retinopathy; NPDR non-proliferative diabetic retinopathy; PDR proliferative diabetic retinopathy; CSME clinically significant macular edema; DME diabetic macular edema; dbh dot blot hemorrhages; CWS cotton wool spot; POAG primary open angle glaucoma; C/D cup-to-disc ratio; HVF humphrey visual field; GVF goldmann visual field; OCT optical coherence tomography; IOP intraocular pressure; BRVO Branch retinal vein occlusion; CRVO central retinal vein occlusion; CRAO central retinal artery occlusion; BRAO branch retinal artery occlusion; RT retinal tear; SB scleral buckle; PPV pars plana vitrectomy; VH Vitreous hemorrhage; PRP panretinal laser photocoagulation; IVK intravitreal kenalog; VMT vitreomacular traction; MH Macular hole;  NVD neovascularization of the disc; NVE neovascularization elsewhere; AREDS age related eye disease study; ARMD age related macular degeneration; POAG primary open angle glaucoma; EBMD epithelial/anterior basement membrane dystrophy; ACIOL anterior chamber intraocular lens; IOL intraocular lens; PCIOL posterior chamber intraocular lens; Phaco/IOL phacoemulsification with intraocular lens placement; [place] photorefractive keratectomy; LASIK laser assisted in situ keratomileusis; HTN hypertension; DM diabetes mellitus; COPD chronic obstructive pulmonary disease

## 2019-05-11 ENCOUNTER — Encounter (INDEPENDENT_AMBULATORY_CARE_PROVIDER_SITE_OTHER): Payer: Self-pay | Admitting: Ophthalmology

## 2019-05-11 ENCOUNTER — Other Ambulatory Visit: Payer: Self-pay

## 2019-05-11 ENCOUNTER — Ambulatory Visit (INDEPENDENT_AMBULATORY_CARE_PROVIDER_SITE_OTHER): Payer: Medicare PPO | Admitting: Ophthalmology

## 2019-05-11 DIAGNOSIS — H3581 Retinal edema: Secondary | ICD-10-CM | POA: Diagnosis not present

## 2019-05-11 DIAGNOSIS — E113313 Type 2 diabetes mellitus with moderate nonproliferative diabetic retinopathy with macular edema, bilateral: Secondary | ICD-10-CM | POA: Diagnosis not present

## 2019-05-11 DIAGNOSIS — H35033 Hypertensive retinopathy, bilateral: Secondary | ICD-10-CM | POA: Diagnosis not present

## 2019-05-11 DIAGNOSIS — I10 Essential (primary) hypertension: Secondary | ICD-10-CM | POA: Diagnosis not present

## 2019-05-11 DIAGNOSIS — Z961 Presence of intraocular lens: Secondary | ICD-10-CM | POA: Diagnosis not present

## 2019-05-11 MED ORDER — BEVACIZUMAB CHEMO INJECTION 1.25MG/0.05ML SYRINGE FOR KALEIDOSCOPE
1.2500 mg | INTRAVITREAL | Status: AC | PRN
  Administered 2019-05-11: 1.25 mg via INTRAVITREAL

## 2019-05-13 ENCOUNTER — Ambulatory Visit (INDEPENDENT_AMBULATORY_CARE_PROVIDER_SITE_OTHER): Payer: Medicare PPO | Admitting: Cardiology

## 2019-05-13 ENCOUNTER — Other Ambulatory Visit: Payer: Self-pay

## 2019-05-13 ENCOUNTER — Encounter: Payer: Self-pay | Admitting: Cardiology

## 2019-05-13 VITALS — BP 125/59 | HR 72 | Ht 67.5 in | Wt 180.8 lb

## 2019-05-13 DIAGNOSIS — I6521 Occlusion and stenosis of right carotid artery: Secondary | ICD-10-CM | POA: Diagnosis not present

## 2019-05-13 DIAGNOSIS — I739 Peripheral vascular disease, unspecified: Secondary | ICD-10-CM

## 2019-05-13 DIAGNOSIS — N183 Chronic kidney disease, stage 3 unspecified: Secondary | ICD-10-CM

## 2019-05-13 DIAGNOSIS — E1122 Type 2 diabetes mellitus with diabetic chronic kidney disease: Secondary | ICD-10-CM

## 2019-05-13 DIAGNOSIS — I2510 Atherosclerotic heart disease of native coronary artery without angina pectoris: Secondary | ICD-10-CM

## 2019-05-13 MED ORDER — CHLORTHALIDONE 25 MG PO TABS: 12.5000 mg | ORAL_TABLET | Freq: Every day | ORAL | 1 refills | Status: DC

## 2019-05-13 MED ORDER — XARELTO 2.5 MG PO TABS: 2.5000 mg | ORAL_TABLET | Freq: Two times a day (BID) | ORAL | 1 refills | Status: DC

## 2019-05-13 NOTE — Progress Notes (Signed)
Primary Physician:  [name], MD   Patient ID: Justin [name], male    DOB: 02/19/1933, 85 y.o.   MRN: [id number]  Subjective:    Chief Complaint  Patient presents with   Coronary Artery Disease   PAD   Results   Follow-up    8wk     HPI: Justin [name]  is a 85 y.o. male  with history of CAD, S/P CABG in 2009, PAD, type II diabetes, asymptomatic right carotid stenosis, and hyperlipidemia. Underwent coronary angiogram on 11/04/2016 at the same time also underwent peripheral arteriogram due to severe symptoms of claudication involving bilateral calf, showed mild disease in SFA and severe small vessel disease left worse in the right with slow flow in left and 2 vessel runoff. Patent coronary grafts except occluded svg to rca which is small and diffusely diseasd.ABI performed on 11/18/2017 revealed a right TBI of 0.6 and left TBI of 0.54. There was no significant change in lower extremity duplex compared to 2017.  Patient has been admitted to the hospital both in January and March 2020 with upper abdominal pain, nausea and vomiting found to be septic with elevated liver enzymes.  Blood culture grew E. coli.  ERCP showed stone which was removed and biliary stent sphincterotomy was performed. At discharge, liver enzymes had essentially normalized. Statin was held due to abnormal LFTs; however, patient reports that he was unaware of this and has continued to take the medicaiton. I had advised him to continue with statin in view of his CAD, PAD, and carotid disease.   I had last seen the patient 6 weeks ago, he mentioned discoloration to both of his toenails that are also tender. He underwent bilateral ABI and now presents for follow up.  Discoloration is improving and left toe blister has completely resolved. States claudication is stable. Dizziness has essentially resolved with support stockings. Recent carotid duplex that showed increased velocity in RCA.  No chest pain. Dyspnea  on exertion is stable. Has not had any further abdominal pain or N/V.    Past Medical History:  Diagnosis Date   Anxiety    Arthritis    Coronary artery disease    Diabetes mellitus without complication ([place])    Diabetic retinopathy ([place])    NPDR OU   Diverticulitis    Dyspnea    GERD (gastroesophageal reflux disease)    Hypercholesteremia    Hypertension    Hypertensive retinopathy    OU   Pneumonia    as a child   Prostate cancer ([place])    UTI (lower urinary tract infection)     Past Surgical History:  Procedure Laterality Date   ABDOMINAL SURGERY     for diverticulitis; also removed appendix   CATARACT EXTRACTION     CATARACT EXTRACTION, BILATERAL     CHOLECYSTECTOMY N/A 10/12/2017   Procedure: LAPAROSCOPIC CHOLECYSTECTOMY;  Surgeon: [name], MD;  Location: [facility];  Service: General;  Laterality: N/A;   CORONARY ARTERY BYPASS GRAFT  2009   ERCP N/A 12/21/2018   Procedure: ENDOSCOPIC RETROGRADE CHOLANGIOPANCREATOGRAPHY (ERCP);  Surgeon: [name], MD;  Location: [facility];  Service: Endoscopy;  Laterality: N/A;   ESOPHAGOGASTRODUODENOSCOPY (EGD) WITH PROPOFOL N/A 12/17/2018   Procedure: ESOPHAGOGASTRODUODENOSCOPY (EGD) WITH PROPOFOL;  Surgeon: [name], MD;  Location: [facility];  Service: Endoscopy;  Laterality: N/A;   EUS Left 12/17/2018   Procedure: UPPER ENDOSCOPIC ULTRASOUND (EUS) LINEAR;  Surgeon: [name], MD;  Location: [facility];  Service: Endoscopy;  Laterality: Left;  EYE SURGERY     "for bleeding in eye"   HERNIA REPAIR     LEFT HEART CATH AND CORONARY ANGIOGRAPHY N/A 11/04/2016   Procedure: Left Heart Cath and Coronary Angiography;  Surgeon: [name], MD;  Location: [facility];  Service: Cardiovascular;  Laterality: N/A;   LOWER EXTREMITY ANGIOGRAPHY N/A 11/04/2016   Procedure: Lower Extremity Angiography;  Surgeon: [name], MD;  Location: [facility] [place];  Service: Cardiovascular;  Laterality:  N/A;   PROSTATE SURGERY     REMOVAL OF STONES  12/21/2018   Procedure: REMOVAL OF STONES;  Surgeon: [name], MD;  Location: [facility] ENDOSCOPY;  Service: Endoscopy;;   SPHINCTEROTOMY  12/21/2018   Procedure: [name];  Surgeon: [name], MD;  Location: [facility] ENDOSCOPY;  Service: Endoscopy;;    Social History   Socioeconomic History   Marital status: Widowed    Spouse name: Not on file   Number of children: 4   Years of education: Not on file   Highest education level: Not on file  Occupational History   Not on file  Social Needs   Financial resource strain: Not on file   Food insecurity    Worry: Not on file    Inability: Not on file   Transportation needs    Medical: Not on file    Non-medical: Not on file  Tobacco Use   Smoking status: Former Smoker    Years: 2.00   Smokeless tobacco: Never Used   Tobacco comment: when he was a teenage age [age]  Substance and Sexual Activity   Alcohol use: No   Drug use: No   Sexual activity: Not on file  Lifestyle   Physical activity    Days per week: Not on file    Minutes per session: Not on file   Stress: Not on file  Relationships   Social connections    Talks on phone: Not on file    Gets together: Not on file    Attends religious service: Not on file    Active member of club or organization: Not on file    Attends meetings of clubs or organizations: Not on file    Relationship status: Not on file   Intimate partner violence    Fear of current or ex partner: Not on file    Emotionally abused: Not on file    Physically abused: Not on file    Forced sexual activity: Not on file  Other Topics Concern   Not on file  Social History Narrative   Not on file    Review of Systems  Constitution: Positive for weight loss. Negative for decreased appetite, malaise/fatigue and weight gain.  Eyes: Negative for visual disturbance.  Cardiovascular: Positive for claudication, dyspnea on exertion and  leg swelling. Negative for chest pain, orthopnea, palpitations and syncope.  Respiratory: Negative for hemoptysis and wheezing.   Endocrine: Negative for cold intolerance and heat intolerance.  Hematologic/Lymphatic: Does not bruise/bleed easily.  Skin: Positive for color change. Negative for nail changes.  Musculoskeletal: Positive for back pain and muscle weakness. Negative for myalgias.  Gastrointestinal: Negative for abdominal pain, change in bowel habit, constipation, diarrhea, nausea and vomiting.  Neurological: Positive for dizziness. Negative for difficulty with concentration, focal weakness and headaches.  Psychiatric/Behavioral: Negative for altered mental status and suicidal ideas.  All other systems reviewed and are negative.     Objective:  Blood pressure (!) 125/59, pulse 72, height 5' 7.5" (1.715 m), weight 180 lb 12.8  oz (82 kg), SpO2 100 %. Body mass index is 27.9 kg/m.      Physical Exam  Constitutional: He is oriented to person, place, and time. Vital signs are normal. He appears well-developed and well-nourished.  HENT:  Head: Normocephalic and atraumatic.  Neck: Normal range of motion.  Cardiovascular: Normal rate, regular rhythm, normal heart sounds and intact distal pulses.  Pulses:      Carotid pulses are on the right side with bruit and on the left side with bruit.      Femoral pulses are 2+ on the right side and 2+ on the left side.      Popliteal pulses are 1+ on the right side and 1+ on the left side.       Dorsalis pedis pulses are 1+ on the right side and 1+ on the left side.       Posterior tibial pulses are 0 on the right side and 0 on the left side.  Split S2  Pulmonary/Chest: Effort normal and breath sounds normal. No accessory muscle usage. No respiratory distress.  Abdominal: Soft. Bowel sounds are normal.  Musculoskeletal: Normal range of motion.  Neurological: He is alert and oriented to person, place, and time.  Skin: Skin is warm and dry.    Vitals reviewed.  Radiology: No results found.  Laboratory examination:    CMP Latest Ref Rng & Units 03/21/2019 12/23/2018 12/22/2018  Glucose 65 - 99 mg/dL 183(H) 114(H) 199(H)  BUN 8 - 27 mg/dL 30(H) 14 14  Creatinine 0.76 - 1.27 mg/dL 1.74(H) 1.30(H) 1.47(H)  Sodium 134 - 144 mmol/L 141 139 135  Potassium 3.5 - 5.2 mmol/L 4.1 3.5 4.1  Chloride 96 - 106 mmol/L 107(H) 113(H) 112(H)  CO2 20 - 29 mmol/L 21 22 20(L)  Calcium 8.6 - 10.2 mg/dL 9.8 8.1(L) 7.6(L)  Total Protein 6.0 - 8.5 g/dL 7.1 5.2(L) 5.1(L)  Total Bilirubin 0.0 - 1.2 mg/dL 0.3 1.2 1.8(H)  Alkaline Phos 39 - 117 IU/L 83 133(H) 152(H)  AST 0 - 40 IU/L 16 39 40  ALT 0 - 44 IU/L 16 42 40   CBC Latest Ref Rng & Units 12/23/2018 12/22/2018 12/21/2018  WBC 4.0 - 10.5 K/uL 8.5 7.8 8.7  Hemoglobin 13.0 - 17.0 g/dL 9.2(L) 9.1(L) 8.9(L)  Hematocrit 39.0 - 52.0 % 28.1(L) 28.8(L) 27.7(L)  Platelets 150 - 400 K/uL 162 149(L) 141(L)   Lipid Panel  No results found for: CHOL, TRIG, HDL, CHOLHDL, VLDL, LDLCALC, LDLDIRECT HEMOGLOBIN A1C Lab Results  Component Value Date   HGBA1C 8.0 (H) 10/08/2017   MPG 182.9 10/08/2017   TSH No results for input(s): TSH in the last 8760 hours.  PRN Meds:. There are no discontinued medications. Current Meds  Medication Sig   acetaminophen (TYLENOL) 500 MG tablet Take 500 mg by mouth daily as needed for mild pain.   AMITIZA 24 MCG capsule Take 24 mcg by mouth as needed for constipation.    aspirin EC 81 MG tablet Take 81 mg by mouth daily.   cilostazol (PLETAL) 100 MG tablet Take 100 mg by mouth 2 (two) times daily.   clonazePAM (KLONOPIN) 0.5 MG tablet Take 0.5 mg by mouth daily as needed for anxiety.   isosorbide mononitrate (IMDUR) 60 MG 24 hr tablet Take 1 tablet (60 mg total) by mouth daily.   JANUVIA 100 MG tablet Take 100 mg by mouth daily.    LANTUS SOLOSTAR 100 UNIT/ML Solostar Pen Inject 15-50 Units into the skin 2 (two) times daily.  50 in the morning 15 in the evening    metoprolol (LOPRESSOR) 50 MG tablet Take 50 mg by mouth 2 (two) times daily.   nitroGLYCERIN (NITROSTAT) 0.4 MG SL tablet Place 1 tablet (0.4 mg total) under the tongue every 5 (five) minutes as needed for chest pain.   omeprazole (PRILOSEC) 40 MG capsule Take by mouth daily.    rosuvastatin (CRESTOR) 20 MG tablet Take 1 tablet (20 mg total) by mouth daily.   tamsulosin (FLOMAX) 0.4 MG CAPS capsule Take 0.4 mg by mouth daily.   vitamin B-12 (CYANOCOBALAMIN) 1000 MCG tablet Take 1,000 mcg by mouth daily.   vitamin B-6 (PYRIDOXINE) 25 MG tablet Take 25 mg by mouth daily.   XARELTO 2.5 MG TABS tablet TAKE 1 TABLET TWICE DAILY   Current Facility-Administered Medications for the 05/13/19 encounter (Office Visit) with Miquel Dunn, NP  Medication   Bevacizumab (AVASTIN) SOLN 1.25 mg   Bevacizumab (AVASTIN) SOLN 1.25 mg   Bevacizumab (AVASTIN) SOLN 1.25 mg   Bevacizumab (AVASTIN) SOLN 1.25 mg    Cardiac Studies:   Carotid artery duplex 2018/11/12: Stenosis in the right internal carotid artery (50-69%), upper end of spectrum. The right PSV internal/common carotid artery ratio is consistent with a stenosis of >70%. Stenosis in the right external carotid artery (<50%). No significant stenosis left ICA. Antegrade right vertebral artery flow. Antegrade left vertebral artery flow. Compared to the study done on 05/06/2018, right ICA stenosis has mildly progressed by velocity and IDA/CCA ratio. Follow up in six months is appropriate if clinically indicated.  ABI 05/02/2019: No hemodynamically significant stenoses are identified in the bilateral lower extremity arterial system.   Non-compressible ABI bilateral, suggestive of medial calcinosis. Moderately abnormal waveform right and mildly abnormal waveform left lower extremity at the level of the ankle. Study suggests small vessel disease. Lower extremity arterial duplex 11/18/2017: No hemodynamically significant stenoses are  identified in the lower extremity arterial system. Abnormal right TBI (0.60). Abnormal left TBI(0.54).  Both TBIs are above the threshold for adequate healing.  ABIs are considered non-diagnostic due to non-compressible vessels in a long-term diabetic male with presumed medial wall calcification.  No significant change from LE arterial duplex 04/30/2016.  Peripheral arteriogram 11/04/2016: Normal abdominal aorta and aorto-iliac arteries. Minimal plaque bilateral SFA. Severe small vessel disease especially left leg at the ankle Medical therapy.  Echo- 12/24/2017 1. Left ventricle cavity is normal in size. Moderate concentric hypertrophy of the left ventricle. Normal global wall motion. Doppler evidence of grade I (impaired) diastolic dysfunction. Calculated EF 54%. 2. Left atrial cavity is mildly dilated. 3. Trileaflet aortic valve. Mild aortic valve leaflet calcification. 4. Mild to moderate mitral regurgitation. Mild calcification of the mitral valve annulus. Mild mitral valve leaflet calcification. 5. Trace tricuspid regurgitation  Coronary angiogram 11/04/2016: Native RCA small and diffusely diseased with distal 70-80% stenosis. Occluded SVG to RCA. Patent SVG to OM-RI, LIMA to LAD. Medical therapy.  Lexiscan myoview stress test 04/17/2016: 1. Resting EKG demonstrates normal sinus rhythm, left axis deviation, right bundle branch block. Stress EKG is nondiagnostic for ischemia as a pharmacologic stress test. There are frequent PACs during the stress test. Stress symptoms included dyspnea. 2. The Perfusion images reveal the left ventricle to be mildly dilated at 132 mL both in rest and stress images. There is a moderate-sized inferior and inferoapical scar with very mild peri-infarct ischemia noted especially towards the apex. Left ventricle systolic function was moderate to severely depressed at 36%. 3. This is an intermediate risk  study, clinical correlation recommended.  Assessment:     No diagnosis found.  EKG 12/06/2018: Normal sinus rhythm at 69 bpm, left axis deviation, left anterior fasicular block, RBBB. Nonspecific T wave abnormality. No changes compared to EKG 06/24/2018.  Recommendations:   I discussed recently obtained bilateral ABI that is essentially unchanged compared to previous lower extremity arterial duplex.  His claudication symptoms have overall been stable.  He recently noted left toe ulceration has completely healed.  He does continue to have bilateral darkening at the base of his toenail, but this seems to be improving.  Do not feel that he needs further evaluation at this point.  He is tolerating low-dose Xarelto well, will continue the same.  No symptoms of angina.  Dizziness has improved with wearing support stockings as well as occasional leg edema.  Blood pressure is also stable.  He is found to be taking both chlorthalidone 25 mg and also 50 mg.  I decreased his chlorthalidone to 25 mg half a tablet daily.  Previously kidney function had worsened, likely attributed to this.  He will need repeat labs for surveillance of his kidney function, we'll defer to PCP.  He is due for carotid duplex that is being performed next week.  I will notify him of results.  Otherwise, patient is stable from a cardiac standpoint.  I'll see him back in 6 months or sooner if needed.  Miquel Dunn, MSN, APRN, FNP-C [facility]. [place] Office: [phone] Fax: [phone]

## 2019-05-18 ENCOUNTER — Other Ambulatory Visit: Payer: Self-pay

## 2019-05-18 ENCOUNTER — Ambulatory Visit (INDEPENDENT_AMBULATORY_CARE_PROVIDER_SITE_OTHER): Payer: Medicare PPO

## 2019-05-18 DIAGNOSIS — I6521 Occlusion and stenosis of right carotid artery: Secondary | ICD-10-CM | POA: Diagnosis not present

## 2019-05-18 DIAGNOSIS — R0989 Other specified symptoms and signs involving the circulatory and respiratory systems: Secondary | ICD-10-CM

## 2019-05-22 ENCOUNTER — Other Ambulatory Visit: Payer: Self-pay | Admitting: Cardiology

## 2019-05-22 DIAGNOSIS — I6521 Occlusion and stenosis of right carotid artery: Secondary | ICD-10-CM

## 2019-05-24 NOTE — Progress Notes (Signed)
[facility] Note  05/25/2019     CHIEF COMPLAINT Patient presents for Retina Follow Up   HISTORY OF PRESENT ILLNESS: Justin [name] is a 85 y.o. male who presents to the clinic today for:   HPI    Retina Follow Up    Patient presents with  Diabetic Retinopathy.  In both eyes.  Severity is moderate.  Duration of 2 weeks.  Since onset it is gradually improving.  I, the attending physician,  performed the HPI with the patient and updated documentation appropriately.          Comments    2 weeks retina follow up for NPDR ou, PRP os today. Patient states some vision improvement. Blood Sugar today 122.        Last edited by [name], MD on 05/25/2019 10:33 AM. (History)    pt states his vision may be a little better than last time  Referring physician: [name], MD [address] [place],  [place] [address]  HISTORICAL INFORMATION:   Selected notes from the [facility] Referred by Dr. [name] for concern of mac edema OU LEE:  Ocular Hx-pseudo OU; formerly followed at [facility] Dr. [name]. S/p focal laser OS x2, history of IVK and IVT OU PMH-anxiety, arthritis, CAD, DM (taking lantus and januvia), HTN, high cholesterol, prostate cancer    CURRENT MEDICATIONS: No current outpatient medications on file. (Ophthalmic Drugs)   No current facility-administered medications for this visit.  (Ophthalmic Drugs)   Current Outpatient Medications (Other)  Medication Sig  . ACCU-CHEK SMARTVIEW test strip   . acetaminophen (TYLENOL) 500 MG tablet Take 500 mg by mouth daily as needed for mild pain.  Marland Kitchen AMITIZA 24 MCG capsule Take 24 mcg by mouth as needed for constipation.   Marland Kitchen aspirin EC 81 MG tablet Take 81 mg by mouth daily.  . chlorthalidone (HYGROTON) 25 MG tablet Take 0.5 tablets (12.5 mg total) by mouth daily.  . cilostazol (PLETAL) 100 MG tablet Take 100 mg by mouth 2 (two) times daily.  .  clonazePAM (KLONOPIN) 0.5 MG tablet Take 0.5 mg by mouth daily as needed for anxiety.  . isosorbide mononitrate (IMDUR) 60 MG 24 hr tablet Take 1 tablet (60 mg total) by mouth daily.  Marland Kitchen JANUVIA 100 MG tablet Take 100 mg by mouth daily.   Marland Kitchen LANTUS SOLOSTAR 100 UNIT/ML Solostar Pen Inject 15-50 Units into the skin 2 (two) times daily. 50 in the morning 15 in the evening  . metoprolol (LOPRESSOR) 50 MG tablet Take 50 mg by mouth 2 (two) times daily.  . nitroGLYCERIN (NITROSTAT) 0.4 MG SL tablet Place 1 tablet (0.4 mg total) under the tongue every 5 (five) minutes as needed for chest pain.  Marland Kitchen omeprazole (PRILOSEC) 40 MG capsule Take by mouth daily.   . rivaroxaban (XARELTO) 2.5 MG TABS tablet Take 1 tablet (2.5 mg total) by mouth 2 (two) times daily.  . rosuvastatin (CRESTOR) 20 MG tablet Take 1 tablet (20 mg total) by mouth daily.  . tamsulosin (FLOMAX) 0.4 MG CAPS capsule Take 0.4 mg by mouth daily.  . vitamin B-12 (CYANOCOBALAMIN) 1000 MCG tablet Take 1,000 mcg by mouth daily.  . vitamin B-6 (PYRIDOXINE) 25 MG tablet Take 25 mg by mouth daily.   Current Facility-Administered Medications (Other)  Medication Route  . Bevacizumab (AVASTIN) SOLN 1.25 mg Intravitreal  . Bevacizumab (AVASTIN) SOLN 1.25 mg Intravitreal  . Bevacizumab (AVASTIN) SOLN 1.25 mg  Intravitreal  . Bevacizumab (AVASTIN) SOLN 1.25 mg Intravitreal      REVIEW OF SYSTEMS: ROS    Positive for: Gastrointestinal, Endocrine, Cardiovascular, Eyes   Negative for: Constitutional, Neurological, Skin, Genitourinary, Musculoskeletal, HENT, Respiratory, Psychiatric, Allergic/Imm, Heme/Lymph   Last edited by Elmore Guise, COT on 05/25/2019  9:29 AM. (History)       ALLERGIES Allergies  Allergen Reactions  . Dye Fdc Red [Red Dye] Swelling and Other (See Comments)    CAT scan dye  . Ibuprofen Other (See Comments)    Upset stomach     PAST MEDICAL HISTORY Past Medical History:  Diagnosis Date  . Anxiety   . Arthritis   .  Coronary artery disease   . Diabetes mellitus without complication ([place])   . Diabetic retinopathy ([place])    NPDR OU  . Diverticulitis   . Dyspnea   . GERD (gastroesophageal reflux disease)   . Hypercholesteremia   . Hypertension   . Hypertensive retinopathy    OU  . Pneumonia    as a child  . Prostate cancer ([place])   . UTI (lower urinary tract infection)    Past Surgical History:  Procedure Laterality Date  . ABDOMINAL SURGERY     for diverticulitis; also removed appendix  . CATARACT EXTRACTION    . CATARACT EXTRACTION, BILATERAL    . CHOLECYSTECTOMY N/A 10/12/2017   Procedure: LAPAROSCOPIC CHOLECYSTECTOMY;  Surgeon: [name], MD;  Location: [facility];  Service: General;  Laterality: N/A;  . CORONARY ARTERY BYPASS GRAFT  2009  . ERCP N/A 12/21/2018   Procedure: ENDOSCOPIC RETROGRADE CHOLANGIOPANCREATOGRAPHY (ERCP);  Surgeon: [name], MD;  Location: [facility];  Service: Endoscopy;  Laterality: N/A;  . ESOPHAGOGASTRODUODENOSCOPY (EGD) WITH PROPOFOL N/A 12/17/2018   Procedure: ESOPHAGOGASTRODUODENOSCOPY (EGD) WITH PROPOFOL;  Surgeon: [name], MD;  Location: [facility];  Service: Endoscopy;  Laterality: N/A;  . EUS Left 12/17/2018   Procedure: UPPER ENDOSCOPIC ULTRASOUND (EUS) LINEAR;  Surgeon: [name], MD;  Location: [facility];  Service: Endoscopy;  Laterality: Left;  . EYE SURGERY     "for bleeding in eye"  . HERNIA REPAIR    . LEFT HEART CATH AND CORONARY ANGIOGRAPHY N/A 11/04/2016   Procedure: Left Heart Cath and Coronary Angiography;  Surgeon: [name], MD;  Location: [facility];  Service: Cardiovascular;  Laterality: N/A;  . LOWER EXTREMITY ANGIOGRAPHY N/A 11/04/2016   Procedure: Lower Extremity Angiography;  Surgeon: [name], MD;  Location: [facility];  Service: Cardiovascular;  Laterality: N/A;  . PROSTATE SURGERY    . REMOVAL OF STONES  12/21/2018   Procedure: REMOVAL OF STONES;  Surgeon: [name], MD;  Location: [facility];   Service: Endoscopy;;  . [name]  12/21/2018   Procedure: [name];  Surgeon: [name], MD;  Location: [facility];  Service: Endoscopy;;    FAMILY HISTORY Family History  Problem Relation Age of Onset  . Diabetes Father   . Diabetes Maternal Aunt   . Diabetes Maternal Uncle   . Diabetes Maternal Grandmother   . Heart disease Sister   . Diabetes Brother   . Heart disease Sister     SOCIAL HISTORY Social History   Tobacco Use  . Smoking status: Former Smoker    Years: 2.00  . Smokeless tobacco: Never Used  . Tobacco comment: when he was a teenage age 8-19  Substance Use Topics  . Alcohol use: No  . Drug use: No  OPHTHALMIC EXAM:  Base Eye Exam    Visual Acuity (Snellen - Linear)      Right Left   Dist cc 20/25 20/30   Dist ph cc 20/NI 20/NI   Correction: Glasses       Tonometry (Tonopen, 9:31 AM)      Right Left   Pressure 18 15       Pupils      Dark Light Shape React APD   Right 3 2 Round Minimal None   Left 3 2 Round Minimal None       Visual Fields (Counting fingers)      Left Right    Full Full       Extraocular Movement      Right Left    Full, Ortho Full, Ortho       Neuro/Psych    Oriented x3: Yes   Mood/Affect: Normal       Dilation    Both eyes: 1.0% Mydriacyl, 2.5% Phenylephrine @ 9:31 AM        Slit Lamp and Fundus Exam    Slit Lamp Exam      Right Left   Lids/Lashes Dermatochalasis - upper lid, Meibomian gland dysfunction Dermatochalasis - upper lid, Meibomian gland dysfunction   Conjunctiva/Sclera Melanosis Melanosis   Cornea Arcus, 1+ Punctate epithelial erosions Arcus, 1+ Punctate epithelial erosions   Anterior Chamber Deep and quiet Deep and quiet   Iris Mild Temporal Iris atrophy, Round and dilated, No NVI Mild Temporal Iris atrophy, Round and dilated, No NVI   Lens PC IOL in good position with mild aterior capsule fimosis PC IOL in good position with mild aterior capsule fimosis   Vitreous  Vitreous syneresis, Posterior vitreous detachment, vitreous condensations, old heme clearing and settling inferiorly Vitreous syneresis, refractile deposits in posterior hyaloid, mild Asteroid hyalosis, fading focal VH vertically elongated inferiorly       Fundus Exam      Right Left   Disc Pink and Sharp, Peripapillary atrophy pink and shap, Tilted disc, mild temporal Temporal Peripapillary atrophy   C/D Ratio 0.2 0.3   Macula Blunted foveal reflex, Retinal pigment epithelial mottling and clumping, Microaneurysms -- improved, persistent central cystic changes  Blunted foveal reflex, focal area of RPE atrophy SN to fovea, Microaneurysms, Retinal pigment epithelial mottling, refractile Epiretinal membrane, persistent central cystic changes    Vessels Vascular attenuation, Tortuous Tortuous, Vascular attenuation   Periphery Attached, scattered MA, good peripheral PRP 360 - room for posterior fill in if needed Attached, scattered IRH        Refraction    Wearing Rx      Sphere Cylinder Axis Add   Right +0.25 +0.50 010 +3.00   Left [place] +0.75 173 +3.00   Type: Bifocal          IMAGING AND PROCEDURES  Imaging and Procedures for _0 @  OCT, Retina - OU - Both Eyes       Right Eye Quality was good. Central Foveal Thickness: 350. Progression has improved. Findings include abnormal foveal contour, intraretinal fluid, no SRF, retinal drusen , outer retinal atrophy (Persistent IRF - minimal change from prior; mild interval improvement in vitreous opacitites).   Left Eye Quality was good. Central Foveal Thickness: 302. Progression has improved. Findings include abnormal foveal contour, no SRF, intraretinal fluid, outer retinal atrophy, retinal drusen  (Trace improvement in cystic changes).   Notes *Images captured and stored on drive  Diagnosis / Impression:  DME OU, OD>OS OD - persistent IRF -  minimal change from prior OS - Trace improvement in cystic changes  Clinical  management:  See below  Abbreviations: NFP - Normal foveal profile. CME - cystoid macular edema. PED - pigment epithelial detachment. IRF - intraretinal fluid. SRF - subretinal fluid. EZ - ellipsoid zone. ERM - epiretinal membrane. ORA - outer retinal atrophy. ORT - outer retinal tubulation. SRHM - subretinal hyper-reflective material         Panretinal Photocoagulation - OD - Right Eye       **ERRONEOUS DUPLICATE ORDER** -- NO CHARGE       Panretinal Photocoagulation - OS - Left Eye       LASER PROCEDURE NOTE  Diagnosis:   Moderate Nonproliferative Diabetic Retinopathy w/ peripheral nonperfusion, LEFT EYE  Procedure:  Pan-retinal photocoagulation using slit lamp laser, LEFT EYE  Anesthesia:  Topical  Surgeon: [name], MD, PhD   Informed consent obtained, operative eye marked, and time out performed prior to initiation of laser.   Lumenis [id number] slit lamp laser Pattern: 3x3 square Power: 320 mW Duration: 40 msec  Spot size: 200 microns  # spots: 1156 spots 756 periphery  Complications: None.  Notes: focal patches of vitreous heme obscuring view and preventing laser up take inferiorly and scattered focal areas  RTC: 2 wks  Patient tolerated the procedure well and received written and verbal post-procedure care information/education.                  ASSESSMENT/PLAN:    ICD-10-CM   1. Moderate nonproliferative diabetic retinopathy of both eyes with macular edema associated with type 2 diabetes mellitus (HCC)  E33.2951 Panretinal Photocoagulation - OD - Right Eye    Panretinal Photocoagulation - OS - Left Eye  2. Retinal edema  H35.81 OCT, Retina - OU - Both Eyes  3. Essential hypertension  I10   4. Hypertensive retinopathy of both eyes  H35.033   5. Pseudophakia of both eyes  Z96.1     1,2. Moderate non-proliferative diabetic retinopathy with edema OU   - moved to [place] from [place], [place] -- history of prior injections OS with Dr.  [name]  - records received and reviewed from [facility] (Dr. [name])  - history of focal laser OS x2 and IVK/IVT OU in [place] -- last injection was IVK OS November 2013  - initial exam: scattered IRH, DBH and +macular edema  - initial OCT: diabetic macular edema, both eyes, (OD > OS)  - FA (10.1.19) showed late leaking MA OU -- no NV  - S/P IVA OD #1 (09.17.19), #2 (10.29.19), #3 (11.26.19), #4 (01.02.20), #5 (01.30.20), #6 (02.28.20), #7 (04.16.20), #8 (06.11.20), #9 (08.12.20)  - S/P IVA OS #1 (10.01.19), #2 (10.29.19), #3 (11.26.19), #4 (01.02.20), #5 (01.30.20), #6 (02.28.20), #7 (04.16.20), #8 (05.14.20), #9 (06.11.20), #10 (07.09.20), #11 (08.12.20)  - S/P IVK OD #1 (05.14.20) -- no significant improvement  - switched back to Avastin OD (#9 6.11.2020)  - S/P PRP OD (07.14.20) - good laser surrounding, room for posterior fill in if needed  - delayed f/u on 4.16.20 due to hospitalization for sepsis / gallstones  - OCT shows persistent IRF OU -- ?resistance to IVA  - BCVA stable at 20/30 OU  - recommend PRP OS today, 08.26.20  - RBA of procedure discussed, questions answered  - informed consent obtained and signed  - see procedure note  - start PF QID OS x7d  - repeated Eylea4U benefits investigation due to possible IVA resistance (8.12.20)  - Eylea4U benefits  investigation re-initiated 8.12.2020 -- has 20% co-insurance, needs Good Days  - f/u 2 weeks, DFE, OCT, possible injection  3,4. Hypertensive retinopathy OU  - discussed importance of tight BP control  - monitor  5. Pseudophakia OU  - s/p CE/IOL OU in [place], [place]  - beautiful surgeries, doing well  - monitor   Ophthalmic Meds Ordered this visit:  No orders of the defined types were placed in this encounter.      Return for f/u 2-3 weeks, NPDR, DFE, OCT.  There are no Patient Instructions on file for this visit.   Explained the diagnoses, plan, and follow up with the patient and they expressed  understanding.  Patient expressed understanding of the importance of proper follow up care.   This document serves as a record of services personally performed by Gardiner Sleeper, MD, PhD. It was created on their behalf by Ernest Mallick, OA, an ophthalmic assistant. The creation of this record is the provider's dictation and/or activities during the visit.    Electronically signed by: Ernest Mallick, OA  08.25.2020 12:49 PM    Gardiner Sleeper, M.D., Ph.D. Diseases & Surgery of the Retina and Vitreous [facility]  I have reviewed the above documentation for accuracy and completeness, and I agree with the above. Gardiner Sleeper, M.D., Ph.D. 05/25/19 12:49 PM   Abbreviations: M myopia (nearsighted); A astigmatism; H hyperopia (farsighted); P presbyopia; Mrx spectacle prescription;  CTL contact lenses; OD right eye; OS left eye; OU both eyes  XT exotropia; ET esotropia; PEK punctate epithelial keratitis; PEE punctate epithelial erosions; DES dry eye syndrome; MGD meibomian gland dysfunction; ATs artificial tears; PFAT's preservative free artificial tears; Wallace nuclear sclerotic cataract; PSC posterior subcapsular cataract; ERM epi-retinal membrane; PVD posterior vitreous detachment; RD retinal detachment; DM diabetes mellitus; DR diabetic retinopathy; NPDR non-proliferative diabetic retinopathy; PDR proliferative diabetic retinopathy; CSME clinically significant macular edema; DME diabetic macular edema; dbh dot blot hemorrhages; CWS cotton wool spot; POAG primary open angle glaucoma; C/D cup-to-disc ratio; HVF humphrey visual field; GVF goldmann visual field; OCT optical coherence tomography; IOP intraocular pressure; BRVO Branch retinal vein occlusion; CRVO central retinal vein occlusion; CRAO central retinal artery occlusion; BRAO branch retinal artery occlusion; RT retinal tear; SB scleral buckle; PPV pars plana vitrectomy; VH Vitreous hemorrhage; PRP panretinal laser  photocoagulation; IVK intravitreal kenalog; VMT vitreomacular traction; MH Macular hole;  NVD neovascularization of the disc; NVE neovascularization elsewhere; AREDS age related eye disease study; ARMD age related macular degeneration; POAG primary open angle glaucoma; EBMD epithelial/anterior basement membrane dystrophy; ACIOL anterior chamber intraocular lens; IOL intraocular lens; PCIOL posterior chamber intraocular lens; Phaco/IOL phacoemulsification with intraocular lens placement; [place] photorefractive keratectomy; LASIK laser assisted in situ keratomileusis; HTN hypertension; DM diabetes mellitus; COPD chronic obstructive pulmonary disease

## 2019-05-25 ENCOUNTER — Ambulatory Visit (INDEPENDENT_AMBULATORY_CARE_PROVIDER_SITE_OTHER): Payer: Medicare PPO | Admitting: Ophthalmology

## 2019-05-25 ENCOUNTER — Encounter (INDEPENDENT_AMBULATORY_CARE_PROVIDER_SITE_OTHER): Payer: Self-pay | Admitting: Ophthalmology

## 2019-05-25 ENCOUNTER — Other Ambulatory Visit: Payer: Self-pay

## 2019-05-25 DIAGNOSIS — H3581 Retinal edema: Secondary | ICD-10-CM | POA: Diagnosis not present

## 2019-05-25 DIAGNOSIS — Z961 Presence of intraocular lens: Secondary | ICD-10-CM

## 2019-05-25 DIAGNOSIS — H35033 Hypertensive retinopathy, bilateral: Secondary | ICD-10-CM | POA: Diagnosis not present

## 2019-05-25 DIAGNOSIS — I10 Essential (primary) hypertension: Secondary | ICD-10-CM

## 2019-05-25 DIAGNOSIS — E113313 Type 2 diabetes mellitus with moderate nonproliferative diabetic retinopathy with macular edema, bilateral: Secondary | ICD-10-CM | POA: Diagnosis not present

## 2019-05-27 NOTE — Progress Notes (Signed)
Pt aware of results and pending appt.//ah

## 2019-06-08 DIAGNOSIS — E118 Type 2 diabetes mellitus with unspecified complications: Secondary | ICD-10-CM | POA: Diagnosis not present

## 2019-06-08 DIAGNOSIS — F419 Anxiety disorder, unspecified: Secondary | ICD-10-CM | POA: Diagnosis not present

## 2019-06-08 DIAGNOSIS — R1013 Epigastric pain: Secondary | ICD-10-CM | POA: Diagnosis not present

## 2019-06-08 DIAGNOSIS — E782 Mixed hyperlipidemia: Secondary | ICD-10-CM | POA: Diagnosis not present

## 2019-06-08 DIAGNOSIS — I2510 Atherosclerotic heart disease of native coronary artery without angina pectoris: Secondary | ICD-10-CM | POA: Diagnosis not present

## 2019-06-08 DIAGNOSIS — N400 Enlarged prostate without lower urinary tract symptoms: Secondary | ICD-10-CM | POA: Diagnosis not present

## 2019-06-08 DIAGNOSIS — Z23 Encounter for immunization: Secondary | ICD-10-CM | POA: Diagnosis not present

## 2019-06-08 DIAGNOSIS — I10 Essential (primary) hypertension: Secondary | ICD-10-CM | POA: Diagnosis not present

## 2019-06-08 DIAGNOSIS — N183 Chronic kidney disease, stage 3 (moderate): Secondary | ICD-10-CM | POA: Diagnosis not present

## 2019-06-08 DIAGNOSIS — G629 Polyneuropathy, unspecified: Secondary | ICD-10-CM | POA: Diagnosis not present

## 2019-06-14 ENCOUNTER — Other Ambulatory Visit: Payer: Self-pay | Admitting: Cardiology

## 2019-06-15 ENCOUNTER — Ambulatory Visit (INDEPENDENT_AMBULATORY_CARE_PROVIDER_SITE_OTHER): Payer: Medicare PPO | Admitting: Ophthalmology

## 2019-06-15 ENCOUNTER — Encounter (INDEPENDENT_AMBULATORY_CARE_PROVIDER_SITE_OTHER): Payer: Self-pay | Admitting: Ophthalmology

## 2019-06-15 ENCOUNTER — Other Ambulatory Visit: Payer: Self-pay

## 2019-06-15 DIAGNOSIS — I10 Essential (primary) hypertension: Secondary | ICD-10-CM | POA: Diagnosis not present

## 2019-06-15 DIAGNOSIS — H35033 Hypertensive retinopathy, bilateral: Secondary | ICD-10-CM | POA: Diagnosis not present

## 2019-06-15 DIAGNOSIS — E113313 Type 2 diabetes mellitus with moderate nonproliferative diabetic retinopathy with macular edema, bilateral: Secondary | ICD-10-CM

## 2019-06-15 DIAGNOSIS — H3581 Retinal edema: Secondary | ICD-10-CM | POA: Diagnosis not present

## 2019-06-15 DIAGNOSIS — Z961 Presence of intraocular lens: Secondary | ICD-10-CM | POA: Diagnosis not present

## 2019-06-15 MED ORDER — AFLIBERCEPT 2MG/0.05ML IZ SOLN FOR KALEIDOSCOPE
2.0000 mg | INTRAVITREAL | Status: AC | PRN
  Administered 2019-06-15: 22:00:00 2 mg via INTRAVITREAL

## 2019-06-15 MED ORDER — AFLIBERCEPT 2MG/0.05ML IZ SOLN FOR KALEIDOSCOPE
2.0000 mg | INTRAVITREAL | Status: AC | PRN
  Administered 2019-06-15: 2 mg via INTRAVITREAL

## 2019-06-15 NOTE — Progress Notes (Signed)
[facility] Note  06/15/2019     CHIEF COMPLAINT Patient presents for Retina Follow Up   HISTORY OF PRESENT ILLNESS: Justin [name] is a 85 y.o. male who presents to the clinic today for:   HPI    Retina Follow Up    Patient presents with  Diabetic Retinopathy.  In both eyes.  This started months ago.  Severity is moderate.  Duration of 3 weeks.  Since onset it is stable.  I, the attending physician,  performed the HPI with the patient and updated documentation appropriately.          Comments    85 y/o male pt here for 3 wk f/u for NPDR OU.  VA OU seems a bit better.  Denies pain, flashes, but c/o intermittent floaters OD, and a constant "spiderweb" in his superior vision OS.  No gtts.  BS 90 this a.m.  A1C 5.6 2 wks ago.       Last edited by [name], MD on 06/15/2019  9:55 PM. (History)    Pt states his vision OU may be a little better than last time.  Few small floaters OU.  "Spiderweb" in his superior vision OS.  Referring physician: [name], MD [address] [place],  [place] [id number]  HISTORICAL INFORMATION:   Selected notes from the MEDICAL RECORD NUMBER Referred by Dr. [name] for concern of mac edema OU LEE:  Ocular Hx-pseudo OU; formerly followed at [facility] Dr. [name]. S/p focal laser OS x2, history of IVK and IVT OU PMH-anxiety, arthritis, CAD, DM (taking lantus and januvia), HTN, high cholesterol, prostate cancer    CURRENT MEDICATIONS: No current outpatient medications on file. (Ophthalmic Drugs)   No current facility-administered medications for this visit.  (Ophthalmic Drugs)   Current Outpatient Medications (Other)  Medication Sig  . ACCU-CHEK SMARTVIEW test strip   . acetaminophen (TYLENOL) 500 MG tablet Take 500 mg by mouth daily as needed for mild pain.  Marland Kitchen AMITIZA 24 MCG capsule Take 24 mcg by mouth as needed for constipation.   Marland Kitchen aspirin EC 81 MG tablet Take  81 mg by mouth daily.  . chlorthalidone (HYGROTON) 25 MG tablet Take 0.5 tablets (12.5 mg total) by mouth daily.  . cilostazol (PLETAL) 100 MG tablet Take 100 mg by mouth 2 (two) times daily.  . clonazePAM (KLONOPIN) 0.5 MG tablet Take 0.5 mg by mouth daily as needed for anxiety.  . isosorbide mononitrate (IMDUR) 60 MG 24 hr tablet Take 1 tablet by mouth once daily  . JANUVIA 100 MG tablet Take 100 mg by mouth daily.   Marland Kitchen LANTUS SOLOSTAR 100 UNIT/ML Solostar Pen Inject 15-50 Units into the skin 2 (two) times daily. 50 in the morning 15 in the evening  . metoprolol (LOPRESSOR) 50 MG tablet Take 50 mg by mouth 2 (two) times daily.  . nitroGLYCERIN (NITROSTAT) 0.4 MG SL tablet Place 1 tablet (0.4 mg total) under the tongue every 5 (five) minutes as needed for chest pain.  Marland Kitchen omeprazole (PRILOSEC) 40 MG capsule Take by mouth daily.   . rivaroxaban (XARELTO) 2.5 MG TABS tablet Take 1 tablet (2.5 mg total) by mouth 2 (two) times daily.  . rosuvastatin (CRESTOR) 20 MG tablet Take 1 tablet (20 mg total) by mouth daily.  . tamsulosin (FLOMAX) 0.4 MG CAPS capsule Take 0.4 mg by mouth daily.  . vitamin B-12 (CYANOCOBALAMIN) 1000 MCG tablet Take 1,000 mcg by mouth daily.  Marland Kitchen  vitamin B-6 (PYRIDOXINE) 25 MG tablet Take 25 mg by mouth daily.   Current Facility-Administered Medications (Other)  Medication Route  . Bevacizumab (AVASTIN) SOLN 1.25 mg Intravitreal  . Bevacizumab (AVASTIN) SOLN 1.25 mg Intravitreal  . Bevacizumab (AVASTIN) SOLN 1.25 mg Intravitreal  . Bevacizumab (AVASTIN) SOLN 1.25 mg Intravitreal      REVIEW OF SYSTEMS: ROS    Positive for: Gastrointestinal, Genitourinary, Endocrine, Cardiovascular, Eyes   Negative for: Constitutional, Neurological, Skin, Musculoskeletal, HENT, Respiratory, Psychiatric, Allergic/Imm, Heme/Lymph   Last edited by Matthew Folks, COA on 06/15/2019  9:51 AM. (History)       ALLERGIES Allergies  Allergen Reactions  . Dye Fdc Red [Red Dye] Swelling and  Other (See Comments)    CAT scan dye  . Ibuprofen Other (See Comments)    Upset stomach     PAST MEDICAL HISTORY Past Medical History:  Diagnosis Date  . Anxiety   . Arthritis   . Coronary artery disease   . Diabetes mellitus without complication ([place])   . Diabetic retinopathy ([place])    NPDR OU  . Diverticulitis   . Dyspnea   . GERD (gastroesophageal reflux disease)   . Hypercholesteremia   . Hypertension   . Hypertensive retinopathy    OU  . Pneumonia    as a child  . Prostate cancer ([place])   . UTI (lower urinary tract infection)    Past Surgical History:  Procedure Laterality Date  . ABDOMINAL SURGERY     for diverticulitis; also removed appendix  . CATARACT EXTRACTION    . CATARACT EXTRACTION, BILATERAL    . CHOLECYSTECTOMY N/A 10/12/2017   Procedure: LAPAROSCOPIC CHOLECYSTECTOMY;  Surgeon: [name], MD;  Location: [facility];  Service: General;  Laterality: N/A;  . CORONARY ARTERY BYPASS GRAFT  2009  . ERCP N/A 12/21/2018   Procedure: ENDOSCOPIC RETROGRADE CHOLANGIOPANCREATOGRAPHY (ERCP);  Surgeon: [name], MD;  Location: [facility];  Service: Endoscopy;  Laterality: N/A;  . ESOPHAGOGASTRODUODENOSCOPY (EGD) WITH PROPOFOL N/A 12/17/2018   Procedure: ESOPHAGOGASTRODUODENOSCOPY (EGD) WITH PROPOFOL;  Surgeon: [name], MD;  Location: [facility];  Service: Endoscopy;  Laterality: N/A;  . EUS Left 12/17/2018   Procedure: UPPER ENDOSCOPIC ULTRASOUND (EUS) LINEAR;  Surgeon: [name], MD;  Location: [facility];  Service: Endoscopy;  Laterality: Left;  . EYE SURGERY     "for bleeding in eye"  . HERNIA REPAIR    . LEFT HEART CATH AND CORONARY ANGIOGRAPHY N/A 11/04/2016   Procedure: Left Heart Cath and Coronary Angiography;  Surgeon: [name], MD;  Location: [facility];  Service: Cardiovascular;  Laterality: N/A;  . LOWER EXTREMITY ANGIOGRAPHY N/A 11/04/2016   Procedure: Lower Extremity Angiography;  Surgeon: [name], MD;  Location: [facility] CV  LAB;  Service: Cardiovascular;  Laterality: N/A;  . PROSTATE SURGERY    . REMOVAL OF STONES  12/21/2018   Procedure: REMOVAL OF STONES;  Surgeon: [name], MD;  Location: [facility] [place];  Service: Endoscopy;;  . [name]  12/21/2018   Procedure: [name];  Surgeon: [name], MD;  Location: [facility] ENDOSCOPY;  Service: Endoscopy;;    FAMILY HISTORY Family History  Problem Relation Age of Onset  . Diabetes Father   . Diabetes Maternal Aunt   . Diabetes Maternal Uncle   . Diabetes Maternal Grandmother   . Heart disease Sister   . Diabetes Brother   . Heart disease Sister     SOCIAL HISTORY Social History   Tobacco Use  . Smoking status: Former Smoker  Years: 2.00  . Smokeless tobacco: Never Used  . Tobacco comment: when he was a teenage age [age]  Substance Use Topics  . Alcohol use: No  . Drug use: No         OPHTHALMIC EXAM:  Base Eye Exam    Visual Acuity (Snellen - Linear)      Right Left   Dist cc 20/25 20/30   Dist ph cc NI NI   Correction: Glasses       Tonometry (Tonopen, 9:53 AM)      Right Left   Pressure 10 12       Pupils      Dark Light Shape React APD   Right 3 2 Round Minimal None   Left 3 2 Round Minimal None       Visual Fields (Counting fingers)      Left Right    Full Full       Extraocular Movement      Right Left    Full, Ortho Full, Ortho       Neuro/Psych    Oriented x3: Yes   Mood/Affect: Normal       Dilation    Both eyes: 1.0% Mydriacyl, 2.5% Phenylephrine @ 9:53 AM        Slit Lamp and Fundus Exam    Slit Lamp Exam      Right Left   Lids/Lashes Dermatochalasis - upper lid, Meibomian gland dysfunction Dermatochalasis - upper lid, Meibomian gland dysfunction   Conjunctiva/Sclera Melanosis Melanosis   Cornea Arcus, 1+ Punctate epithelial erosions Arcus, 1+ Punctate epithelial erosions   Anterior Chamber Deep and quiet Deep and quiet   Iris Mild Temporal Iris atrophy, Round and dilated, No NVI Mild  Temporal Iris atrophy, Round and dilated, No NVI   Lens PC IOL in good position with mild aterior capsule fimosis PC IOL in good position with mild aterior capsule fimosis   Vitreous Vitreous syneresis, Posterior vitreous detachment, vitreous condensations, old heme clearing and settling inferiorly Vitreous syneresis, refractile deposits in posterior hyaloid, mild Asteroid hyalosis, persistent blood stained vitreous inferiorly.       Fundus Exam      Right Left   Disc Pink and Sharp, Peripapillary atrophy pink and shap, Tilted disc, mild temporal Temporal Peripapillary atrophy   C/D Ratio 0.2 0.3   Macula Blunted foveal reflex, Retinal pigment epithelial mottling and clumping, Microaneurysms -- improved, persistent central cystic changes  Blunted foveal reflex, focal area of RPE atrophy SN to fovea, Microaneurysms, Retinal pigment epithelial mottling, refractile Epiretinal membrane, persistent central cystic changes -- slightly increased.   Vessels Vascular attenuation, Tortuous Tortuous, Vascular attenuation   Periphery Attached, scattered MA, good peripheral PRP 360 - room for posterior fill in if needed Attached, scattered IRH.  Peripheral PRP 360.        Refraction    Wearing Rx      Sphere Cylinder Axis Add   Right +0.25 +0.50 010 +3.00   Left [place] +0.75 173 +3.00   Age: 55yr   Type: Bifocal       Manifest Refraction      Sphere Cylinder Axis Dist VA   Right +0.50 +1.25 013 20/25   Left +0.50 +0.75 170 20/30          IMAGING AND PROCEDURES  Imaging and Procedures for _0 @  OCT, Retina - OU - Both Eyes       Right Eye Quality was good. Central Foveal Thickness: 349. Progression has been stable.  Findings include abnormal foveal contour, intraretinal fluid, no SRF, retinal drusen , outer retinal atrophy (Persistent IRF - minimal change from prior; mild interval improvement in vitreous opacitites).   Left Eye Quality was good. Central Foveal Thickness: 347.  Progression has worsened. Findings include abnormal foveal contour, no SRF, intraretinal fluid, outer retinal atrophy, retinal drusen  (Interval increase in irf/cystic changes.).   Notes *Images captured and stored on drive  Diagnosis / Impression:  DME OU, OD>OS OD - persistent IRF - minimal change from prior OS - Interval increase in IRF/cystic changes.  Clinical management:  See below  Abbreviations: NFP - Normal foveal profile. CME - cystoid macular edema. PED - pigment epithelial detachment. IRF - intraretinal fluid. SRF - subretinal fluid. EZ - ellipsoid zone. ERM - epiretinal membrane. ORA - outer retinal atrophy. ORT - outer retinal tubulation. SRHM - subretinal hyper-reflective material         Intravitreal Injection, Pharmacologic Agent - OD - Right Eye       Time Out 06/15/2019. 10:22 AM. Confirmed correct patient, procedure, site, and patient consented.   Anesthesia Topical anesthesia was used. Anesthetic medications included Lidocaine 2%, Proparacaine 0.5%.   Procedure Preparation included 5% betadine to ocular surface, eyelid speculum. A 30 gauge needle was used.   Injection:  2 mg aflibercept Alfonse Flavors) SOLN   NDC: A3590391, Lot: [id number], Expiration date: 12/17/2019   Route: Intravitreal, Site: Right Eye, Waste: 0.05 mL  Post-op Post injection exam found visual acuity of at least counting fingers. The patient tolerated the procedure well. There were no complications. The patient received written and verbal post procedure care education.        Intravitreal Injection, Pharmacologic Agent - OS - Left Eye       Time Out 06/15/2019. 10:52 AM. Confirmed correct patient, procedure, site, and patient consented.   Anesthesia Topical anesthesia was used. Anesthetic medications included Lidocaine 2%, Proparacaine 0.5%.   Procedure Preparation included 5% betadine to ocular surface, eyelid speculum. A 30 gauge needle was used.   Injection:  2 mg  aflibercept Alfonse Flavors) SOLN   NDC: M7179715, Lot: [id number], Expiration date: 03/18/2020   Route: Intravitreal, Site: Left Eye, Waste: 0.05 mL  Post-op Post injection exam found visual acuity of at least counting fingers. The patient tolerated the procedure well. There were no complications. The patient received written and verbal post procedure care education.                 ASSESSMENT/PLAN:    ICD-10-CM   1. Moderate nonproliferative diabetic retinopathy of both eyes with macular edema associated with type 2 diabetes mellitus (HCC)  B15.1761 Intravitreal Injection, Pharmacologic Agent - OD - Right Eye    Intravitreal Injection, Pharmacologic Agent - OS - Left Eye    aflibercept (EYLEA) SOLN 2 mg    aflibercept (EYLEA) SOLN 2 mg  2. Retinal edema  H35.81 OCT, Retina - OU - Both Eyes  3. Essential hypertension  I10   4. Hypertensive retinopathy of both eyes  H35.033   5. Pseudophakia of both eyes  Z96.1     1,2. Moderate non-proliferative diabetic retinopathy with edema OU   - moved to [place] from [place], [place] -- history of prior injections OS with Dr. [name]  - records received and reviewed from [facility] (Dr. [name])  - history of focal laser OS x2 and IVK/IVT OU in [place] -- last injection was IVK OS November 2013  - initial exam: scattered IRH, DBH and +macular edema  -  initial OCT: diabetic macular edema, both eyes, (OD > OS)  - FA (10.1.19) showed late leaking MA OU -- no NV  - S/P IVA OD #1 (09.17.19), #2 (10.29.19), #3 (11.26.19), #4 (01.02.20), #5 (01.30.20), #6 (02.28.20), #7 (04.16.20), #8 (06.11.20), #9 (08.12.20)  - S/P IVA OS #1 (10.01.19), #2 (10.29.19), #3 (11.26.19), #4 (01.02.20), #5 (01.30.20), #6 (02.28.20), #7 (04.16.20), #8 (05.14.20), #9 (06.11.20), #10 (07.09.20), #11 (08.12.20)  - delayed f/u on 4.16.20 due to hospitalization for sepsis / gallstones  - S/P IVK OD #1 (05.14.20) -- no significant improvement  - switched back to  Avastin OD (#9 6.11.2020)  - S/P PRP OD (07.14.20) - good laser surrounding, room for posterior fill in if needed  - S/P PRP OS (08.26.20)  - OCT shows persistent IRF OU -- apparent resistance to IVA  - repeated Eylea4U benefits investigation due to possible IVA resistance (8.12.20)  - Eylea4U benefits investigation re-initiated 8.12.2020 -- approved for Eylea coverage and Good Days  - BCVA 20/25 OD, 20/30 OS  - discussed findings and possibility of switching antiVEGF medications  - recommend IVE OU #1 today, 09.16.20 -- pt wishes to proceed  - RBA of procedure discussed, questions answered  - informed consent obtained and signed  - see procedure note  - f/u 4 weeks, DFE, OCT, possible injection  3,4. Hypertensive retinopathy OU  - discussed importance of tight BP control  - monitor  5. Pseudophakia OU  - s/p CE/IOL OU in [place], [place]  - beautiful surgeries, doing well  - monitor   Ophthalmic Meds Ordered this visit:  Meds ordered this encounter  Medications  . aflibercept (EYLEA) SOLN 2 mg  . aflibercept (EYLEA) SOLN 2 mg       Return in about 4 weeks (around 07/13/2019) for DFE, OCT, possible injection.  There are no Patient Instructions on file for this visit.   Explained the diagnoses, plan, and follow up with the patient and they expressed understanding.  Patient expressed understanding of the importance of proper follow up care.   This document serves as a record of services personally performed by Gardiner Sleeper, MD, PhD. It was created on their behalf by Ernest Mallick, OA, an ophthalmic assistant. The creation of this record is the provider's dictation and/or activities during the visit.    Electronically signed by: Ernest Mallick, OA  09.16.2020 10:06 PM    Gardiner Sleeper, M.D., Ph.D. Diseases & Surgery of the Retina and Vitreous [facility]  I have reviewed the above documentation for accuracy and completeness, and I agree with the  above. Gardiner Sleeper, M.D., Ph.D. 06/15/19 10:06 PM   Abbreviations: M myopia (nearsighted); A astigmatism; H hyperopia (farsighted); P presbyopia; Mrx spectacle prescription;  CTL contact lenses; OD right eye; OS left eye; OU both eyes  XT exotropia; ET esotropia; PEK punctate epithelial keratitis; PEE punctate epithelial erosions; DES dry eye syndrome; MGD meibomian gland dysfunction; ATs artificial tears; PFAT's preservative free artificial tears; [place] nuclear sclerotic cataract; PSC posterior subcapsular cataract; ERM epi-retinal membrane; PVD posterior vitreous detachment; RD retinal detachment; DM diabetes mellitus; DR diabetic retinopathy; NPDR non-proliferative diabetic retinopathy; PDR proliferative diabetic retinopathy; CSME clinically significant macular edema; DME diabetic macular edema; dbh dot blot hemorrhages; CWS cotton wool spot; POAG primary open angle glaucoma; C/D cup-to-disc ratio; HVF humphrey visual field; GVF goldmann visual field; OCT optical coherence tomography; IOP intraocular pressure; BRVO Branch retinal vein occlusion; CRVO central retinal vein occlusion; CRAO central retinal artery occlusion;  BRAO branch retinal artery occlusion; RT retinal tear; SB scleral buckle; PPV pars plana vitrectomy; VH Vitreous hemorrhage; PRP panretinal laser photocoagulation; IVK intravitreal kenalog; VMT vitreomacular traction; MH Macular hole;  NVD neovascularization of the disc; NVE neovascularization elsewhere; AREDS age related eye disease study; ARMD age related macular degeneration; POAG primary open angle glaucoma; EBMD epithelial/anterior basement membrane dystrophy; ACIOL anterior chamber intraocular lens; IOL intraocular lens; PCIOL posterior chamber intraocular lens; Phaco/IOL phacoemulsification with intraocular lens placement; [place] photorefractive keratectomy; LASIK laser assisted in situ keratomileusis; HTN hypertension; DM diabetes mellitus; COPD chronic obstructive pulmonary disease

## 2019-07-04 ENCOUNTER — Other Ambulatory Visit: Payer: Self-pay | Admitting: Cardiology

## 2019-07-04 DIAGNOSIS — N183 Chronic kidney disease, stage 3 unspecified: Secondary | ICD-10-CM | POA: Diagnosis not present

## 2019-07-08 ENCOUNTER — Other Ambulatory Visit: Payer: Self-pay

## 2019-07-08 MED ORDER — ROSUVASTATIN CALCIUM 20 MG PO TABS: 20.0000 mg | ORAL_TABLET | Freq: Every day | ORAL | 2 refills | Status: DC

## 2019-07-11 NOTE — Progress Notes (Signed)
[facility] Note  07/15/2019     CHIEF COMPLAINT Patient presents for Retina Follow Up   HISTORY OF PRESENT ILLNESS: Justin [name] is a 85 y.o. male who presents to the clinic today for:   HPI    Retina Follow Up    Patient presents with  Diabetic Retinopathy.  In right eye.  Severity is moderate.  Duration of 4 weeks.  Since onset it is gradually improving.  I, the attending physician,  performed the HPI with the patient and updated documentation appropriately.          Comments    Patient states vision slightly improving OU. BS was 128 this am. Last a1c was 5.6, checked 2 weeks ago. No longer sees spider web in superior vision OS.       Last edited by [name], MD on 07/15/2019 10:35 AM. (History)    Pt states he feels like he is able to read better since receiving the injection of Eylea  Referring physician: Gevena Cotton, MD [place],  [place] [address]  HISTORICAL INFORMATION:   Selected notes from the MEDICAL RECORD NUMBER Referred by Dr. [name] for concern of mac edema OU LEE:  Ocular Hx-pseudo OU; formerly followed at [facility] Dr. [name]. S/p focal laser OS x2, history of IVK and IVT OU PMH-anxiety, arthritis, CAD, DM (taking lantus and januvia), HTN, high cholesterol, prostate cancer    CURRENT MEDICATIONS: No current outpatient medications on file. (Ophthalmic Drugs)   No current facility-administered medications for this visit.  (Ophthalmic Drugs)   Current Outpatient Medications (Other)  Medication Sig  . ACCU-CHEK SMARTVIEW test strip   . acetaminophen (TYLENOL) 500 MG tablet Take 500 mg by mouth daily as needed for mild pain.  Marland Kitchen AMITIZA 24 MCG capsule Take 24 mcg by mouth as needed for constipation.   Marland Kitchen aspirin EC 81 MG tablet Take 81 mg by mouth daily.  . chlorthalidone (HYGROTON) 25 MG tablet Take 0.5 tablets (12.5 mg total) by mouth daily.  .  cilostazol (PLETAL) 100 MG tablet Take 100 mg by mouth 2 (two) times daily.  . clonazePAM (KLONOPIN) 0.5 MG tablet Take 0.5 mg by mouth daily as needed for anxiety.  . isosorbide mononitrate (IMDUR) 60 MG 24 hr tablet Take 1 tablet by mouth once daily  . JANUVIA 100 MG tablet Take 100 mg by mouth daily.   Marland Kitchen LANTUS SOLOSTAR 100 UNIT/ML Solostar Pen Inject 15-50 Units into the skin 2 (two) times daily. 50 in the morning 15 in the evening  . metoprolol (LOPRESSOR) 50 MG tablet Take 50 mg by mouth 2 (two) times daily.  . nitroGLYCERIN (NITROSTAT) 0.4 MG SL tablet Place 1 tablet (0.4 mg total) under the tongue every 5 (five) minutes as needed for chest pain.  Marland Kitchen omeprazole (PRILOSEC) 40 MG capsule Take by mouth daily.   . rosuvastatin (CRESTOR) 20 MG tablet Take 1 tablet (20 mg total) by mouth daily.  . tamsulosin (FLOMAX) 0.4 MG CAPS capsule Take 0.4 mg by mouth daily.  . vitamin B-12 (CYANOCOBALAMIN) 1000 MCG tablet Take 1,000 mcg by mouth daily.  . vitamin B-6 (PYRIDOXINE) 25 MG tablet Take 25 mg by mouth daily.  [name] 2.5 MG TABS tablet TAKE 1 TABLET TWICE DAILY   Current Facility-Administered Medications (Other)  Medication Route  . Bevacizumab (AVASTIN) SOLN 1.25 mg Intravitreal  . Bevacizumab (AVASTIN) SOLN 1.25 mg Intravitreal  . Bevacizumab (AVASTIN) SOLN  1.25 mg Intravitreal  . Bevacizumab (AVASTIN) SOLN 1.25 mg Intravitreal      REVIEW OF SYSTEMS: ROS    Positive for: Gastrointestinal, Genitourinary, Endocrine, Cardiovascular, Eyes   Negative for: Constitutional, Neurological, Skin, Musculoskeletal, HENT, Respiratory, Psychiatric, Allergic/Imm, Heme/Lymph   Last edited by Roselee Nova D, COT on 07/15/2019  9:26 AM. (History)       ALLERGIES Allergies  Allergen Reactions  . Dye Fdc Red [Red Dye] Swelling and Other (See Comments)    CAT scan dye  . Ibuprofen Other (See Comments)    Upset stomach     PAST MEDICAL HISTORY Past Medical History:  Diagnosis Date  .  Anxiety   . Arthritis   . Coronary artery disease   . Diabetes mellitus without complication ([place])   . Diabetic retinopathy ([place])    NPDR OU  . Diverticulitis   . Dyspnea   . GERD (gastroesophageal reflux disease)   . Hypercholesteremia   . Hypertension   . Hypertensive retinopathy    OU  . Pneumonia    as a child  . Prostate cancer ([place])   . UTI (lower urinary tract infection)    Past Surgical History:  Procedure Laterality Date  . ABDOMINAL SURGERY     for diverticulitis; also removed appendix  . CATARACT EXTRACTION    . CATARACT EXTRACTION, BILATERAL    . CHOLECYSTECTOMY N/A 10/12/2017   Procedure: LAPAROSCOPIC CHOLECYSTECTOMY;  Surgeon: [name], MD;  Location: [facility];  Service: General;  Laterality: N/A;  . CORONARY ARTERY BYPASS GRAFT  2009  . ERCP N/A 12/21/2018   Procedure: ENDOSCOPIC RETROGRADE CHOLANGIOPANCREATOGRAPHY (ERCP);  Surgeon: [name], MD;  Location: [facility];  Service: Endoscopy;  Laterality: N/A;  . ESOPHAGOGASTRODUODENOSCOPY (EGD) WITH PROPOFOL N/A 12/17/2018   Procedure: ESOPHAGOGASTRODUODENOSCOPY (EGD) WITH PROPOFOL;  Surgeon: [name], MD;  Location: [facility];  Service: Endoscopy;  Laterality: N/A;  . EUS Left 12/17/2018   Procedure: UPPER ENDOSCOPIC ULTRASOUND (EUS) LINEAR;  Surgeon: [name], MD;  Location: [facility];  Service: Endoscopy;  Laterality: Left;  . EYE SURGERY     "for bleeding in eye"  . HERNIA REPAIR    . LEFT HEART CATH AND CORONARY ANGIOGRAPHY N/A 11/04/2016   Procedure: Left Heart Cath and Coronary Angiography;  Surgeon: [name], MD;  Location: [facility];  Service: Cardiovascular;  Laterality: N/A;  . LOWER EXTREMITY ANGIOGRAPHY N/A 11/04/2016   Procedure: Lower Extremity Angiography;  Surgeon: [name], MD;  Location: [facility];  Service: Cardiovascular;  Laterality: N/A;  . PROSTATE SURGERY    . REMOVAL OF STONES  12/21/2018   Procedure: REMOVAL OF STONES;  Surgeon: [name],  MD;  Location: [facility];  Service: Endoscopy;;  . [name]  12/21/2018   Procedure: [name];  Surgeon: [name], MD;  Location: [facility] ENDOSCOPY;  Service: Endoscopy;;    FAMILY HISTORY Family History  Problem Relation Age of Onset  . Diabetes Father   . Diabetes Maternal Aunt   . Diabetes Maternal Uncle   . Diabetes Maternal Grandmother   . Heart disease Sister   . Diabetes Brother   . Heart disease Sister     SOCIAL HISTORY Social History   Tobacco Use  . Smoking status: Former Smoker    Years: 2.00  . Smokeless tobacco: Never Used  . Tobacco comment: when he was a teenage age [age]  Substance Use Topics  . Alcohol use: No  . Drug use: No  OPHTHALMIC EXAM:  Base Eye Exam    Visual Acuity (Snellen - Linear)      Right Left   Dist cc 20/30 20/30 -2   Dist ph cc NI 20/30   Correction: Glasses       Tonometry (Tonopen, 9:38 AM)      Right Left   Pressure 13 13       Pupils      Dark Light Shape React APD   Right 3 2 Round Minimal None   Left 3 2 Round Minimal None       Visual Fields (Counting fingers)      Left Right    Full Full       Extraocular Movement      Right Left    Full, Ortho Full, Ortho       Neuro/Psych    Oriented x3: Yes   Mood/Affect: Normal       Dilation    Both eyes: 1.0% Mydriacyl, 2.5% Phenylephrine @ 9:38 AM        Slit Lamp and Fundus Exam    Slit Lamp Exam      Right Left   Lids/Lashes Dermatochalasis - upper lid, Meibomian gland dysfunction Dermatochalasis - upper lid, Meibomian gland dysfunction   Conjunctiva/Sclera Melanosis Melanosis   Cornea Arcus, 1+ Punctate epithelial erosions Arcus, 1+ Punctate epithelial erosions   Anterior Chamber Deep and quiet Deep and quiet   Iris Mild Temporal Iris atrophy, Round and dilated, No NVI Mild Temporal Iris atrophy, Round and dilated, No NVI   Lens PC IOL in good position with mild aterior capsule fimosis PC IOL in good position with mild aterior  capsule fimosis   Vitreous Vitreous syneresis, Posterior vitreous detachment, vitreous condensations, old heme clearing and settling inferiorly Vitreous syneresis, refractile deposits in posterior hyaloid, mild Asteroid hyalosis, persistent blood stained vitreous inferiorly.       Fundus Exam      Right Left   Disc Pink and Sharp, Peripapillary atrophy pink and shap, Tilted disc, mild temporal Temporal Peripapillary atrophy   C/D Ratio 0.2 0.3   Macula Blunted foveal reflex, Retinal pigment epithelial mottling and clumping, Microaneurysms -- improved, persistent central cystic changes  Blunted foveal reflex, focal area of RPE atrophy SN to fovea, Microaneurysms, Retinal pigment epithelial mottling, refractile Epiretinal membrane, persistent central cystic changes -- slightly decreased   Vessels Vascular attenuation, Tortuous Tortuous, Vascular attenuation   Periphery Attached, scattered MA, good peripheral PRP 360 - room for posterior fill in if needed Attached, scattered IRH.  Peripheral PRP 360.        Refraction    Wearing Rx      Sphere Cylinder Axis Add   Right +0.25 +0.50 010 +3.00   Left [place] +0.75 173 +3.00   Type: Bifocal       Manifest Refraction      Sphere Cylinder Axis Dist VA   Right +0.25 +0.75 010 20/30   Left -0.75 +1.25 180 20/30          IMAGING AND PROCEDURES  Imaging and Procedures for _0 @  OCT, Retina - OU - Both Eyes       Right Eye Quality was good. Central Foveal Thickness: 348. Progression has been stable. Findings include abnormal foveal contour, intraretinal fluid, no SRF, retinal drusen , outer retinal atrophy (Persistent IRF - minimal change from prior; mild interval improvement in vitreous opacitites).   Left Eye Quality was good. Central Foveal Thickness: 294. Progression has improved. Findings include  abnormal foveal contour, no SRF, intraretinal fluid, outer retinal atrophy, retinal drusen  (Interval improvement in IRF).    Notes *Images captured and stored on drive  Diagnosis / Impression:  DME OU, OD>OS OD - persistent IRF - minimal change from prior OS - Interval improvement in IRF/cystic changes.  Clinical management:  See below  Abbreviations: NFP - Normal foveal profile. CME - cystoid macular edema. PED - pigment epithelial detachment. IRF - intraretinal fluid. SRF - subretinal fluid. EZ - ellipsoid zone. ERM - epiretinal membrane. ORA - outer retinal atrophy. ORT - outer retinal tubulation. SRHM - subretinal hyper-reflective material         Intravitreal Injection, Pharmacologic Agent - OD - Right Eye       Time Out 07/15/2019. 9:43 AM. Confirmed correct patient, procedure, site, and patient consented.   Anesthesia Topical anesthesia was used. Anesthetic medications included Lidocaine 2%, Proparacaine 0.5%.   Procedure Preparation included 5% betadine to ocular surface, eyelid speculum. A supplied needle was used.   Injection:  2 mg aflibercept Alfonse Flavors) SOLN   NDC: A3590391, Lot: [id number], Expiration date: 01/17/2020   Route: Intravitreal, Site: Right Eye, Waste: 0.05 mL  Post-op Post injection exam found visual acuity of at least counting fingers. The patient tolerated the procedure well. There were no complications. The patient received written and verbal post procedure care education.        Intravitreal Injection, Pharmacologic Agent - OS - Left Eye       Time Out 07/15/2019. 9:56 AM. Confirmed correct patient, procedure, site, and patient consented.   Anesthesia Topical anesthesia was used. Anesthetic medications included Lidocaine 2%, Proparacaine 0.5%.   Procedure Preparation included 5% betadine to ocular surface, eyelid speculum. A 30 gauge needle was used.   Injection:  2 mg aflibercept Alfonse Flavors) SOLN   NDC: A3590391, Lot: [id number], Expiration date: 01/17/2020   Route: Intravitreal, Site: Left Eye, Waste: 0.05 mL  Post-op Post injection exam found  visual acuity of at least counting fingers. The patient tolerated the procedure well. There were no complications. The patient received written and verbal post procedure care education.                 ASSESSMENT/PLAN:    ICD-10-CM   1. Moderate nonproliferative diabetic retinopathy of both eyes with macular edema associated with type 2 diabetes mellitus (HCC)  O84.1660 Intravitreal Injection, Pharmacologic Agent - OD - Right Eye    Intravitreal Injection, Pharmacologic Agent - OS - Left Eye    aflibercept (EYLEA) SOLN 2 mg    aflibercept (EYLEA) SOLN 2 mg  2. Retinal edema  H35.81 OCT, Retina - OU - Both Eyes  3. Essential hypertension  I10   4. Hypertensive retinopathy of both eyes  H35.033   5. Pseudophakia of both eyes  Z96.1     1,2. Moderate non-proliferative diabetic retinopathy with edema OU   - moved to [place] from [place], [place] -- history of prior injections OS with Dr. [name]  - records received and reviewed from [facility] (Dr. [name])  - history of focal laser OS x2 and IVK/IVT OU in [place] -- last injection was IVK OS November 2013  - initial exam: scattered IRH, DBH and +macular edema  - initial OCT: diabetic macular edema, both eyes, (OD > OS)  - FA (10.1.19) showed late leaking MA OU -- no NV  - delayed f/u on 4.16.20 due to hospitalization for sepsis / gallstones  - S/P PRP OD (07.14.20) - good laser  surrounding, room for posterior fill in if needed  - S/P PRP OS (08.26.20)  - S/P IVA OD #1 (09.17.19), #2 (10.29.19), #3 (11.26.19), #4 (01.02.20), #5 (01.30.20), #6 (02.28.20), #7 (04.16.20), #8 (06.11.20), #9 (08.12.20)  - S/P IVA OS #1 (10.01.19), #2 (10.29.19), #3 (11.26.19), #4 (01.02.20), #5 (01.30.20), #6 (02.28.20), #7 (04.16.20), #8 (05.14.20), #9 (06.11.20), #10 (07.09.20), #11 (08.12.20)  - S/P IVK OD #1 (05.14.20) -- no significant improvement  - switched back to Avastin OD (#9 6.11.2020)  - repeated Eylea4U benefits investigation due  to possible IVA resistance (8.12.20)  - Eylea4U benefits investigation re-initiated 8.12.2020 -- approved for Eylea coverage and Good Days  - S/P IVE OS #1 (09.16.20)  - OCT today shows persistent IRF OD, interval improvement in IRF OS  - BCVA stable at 20/30 OU  - recommend IVE OU #2 today, 10.16.20 -- pt wishes to proceed  - RBA of procedure discussed, questions answered  - informed consent obtained and signed  - see procedure note  - f/u 4 weeks, DFE, OCT, possible injection  3,4. Hypertensive retinopathy OU  - discussed importance of tight BP control  - monitor  5. Pseudophakia OU  - s/p CE/IOL OU in [place], [place]  - beautiful surgeries, doing well  - monitor   Ophthalmic Meds Ordered this visit:  Meds ordered this encounter  Medications  . aflibercept (EYLEA) SOLN 2 mg  . aflibercept (EYLEA) SOLN 2 mg       Return in about 4 weeks (around 08/12/2019) for Dilated Exam, OCT, Possible Injxn.  There are no Patient Instructions on file for this visit.   Explained the diagnoses, plan, and follow up with the patient and they expressed understanding.  Patient expressed understanding of the importance of proper follow up care.   This document serves as a record of services personally performed by Gardiner Sleeper, MD, PhD. It was created on their behalf by Roselee Nova, COMT. The creation of this record is the provider's dictation and/or activities during the visit.  Electronically signed by: Roselee Nova, COMT 07/16/19 1:54 AM   This document serves as a record of services personally performed by Gardiner Sleeper, MD, PhD. It was created on their behalf by Ernest Mallick, OA, an ophthalmic assistant. The creation of this record is the provider's dictation and/or activities during the visit.    Electronically signed by: Ernest Mallick, OA 10.16.2020 1:54 AM  Gardiner Sleeper, M.D., Ph.D. Diseases & Surgery of the Retina and Vitreous [facility] 07/16/19  I  have reviewed the above documentation for accuracy and completeness, and I agree with the above. Gardiner Sleeper, M.D., Ph.D. 07/16/19 1:54 AM    Abbreviations: M myopia (nearsighted); A astigmatism; H hyperopia (farsighted); P presbyopia; Mrx spectacle prescription;  CTL contact lenses; OD right eye; OS left eye; OU both eyes  XT exotropia; ET esotropia; PEK punctate epithelial keratitis; PEE punctate epithelial erosions; DES dry eye syndrome; MGD meibomian gland dysfunction; ATs artificial tears; PFAT's preservative free artificial tears; [place] nuclear sclerotic cataract; PSC posterior subcapsular cataract; ERM epi-retinal membrane; PVD posterior vitreous detachment; RD retinal detachment; DM diabetes mellitus; DR diabetic retinopathy; NPDR non-proliferative diabetic retinopathy; PDR proliferative diabetic retinopathy; CSME clinically significant macular edema; DME diabetic macular edema; dbh dot blot hemorrhages; CWS cotton wool spot; POAG primary open angle glaucoma; C/D cup-to-disc ratio; HVF humphrey visual field; GVF goldmann visual field; OCT optical coherence tomography; IOP intraocular pressure; BRVO Branch retinal vein occlusion; CRVO central retinal vein  occlusion; CRAO central retinal artery occlusion; BRAO branch retinal artery occlusion; RT retinal tear; SB scleral buckle; PPV pars plana vitrectomy; VH Vitreous hemorrhage; PRP panretinal laser photocoagulation; IVK intravitreal kenalog; VMT vitreomacular traction; MH Macular hole;  NVD neovascularization of the disc; NVE neovascularization elsewhere; AREDS age related eye disease study; ARMD age related macular degeneration; POAG primary open angle glaucoma; EBMD epithelial/anterior basement membrane dystrophy; ACIOL anterior chamber intraocular lens; IOL intraocular lens; PCIOL posterior chamber intraocular lens; Phaco/IOL phacoemulsification with intraocular lens placement; [place] photorefractive keratectomy; LASIK laser assisted in situ  keratomileusis; HTN hypertension; DM diabetes mellitus; COPD chronic obstructive pulmonary disease

## 2019-07-13 DIAGNOSIS — N183 Chronic kidney disease, stage 3 unspecified: Secondary | ICD-10-CM | POA: Diagnosis not present

## 2019-07-13 DIAGNOSIS — D631 Anemia in chronic kidney disease: Secondary | ICD-10-CM | POA: Diagnosis not present

## 2019-07-13 DIAGNOSIS — N2581 Secondary hyperparathyroidism of renal origin: Secondary | ICD-10-CM | POA: Diagnosis not present

## 2019-07-13 DIAGNOSIS — I129 Hypertensive chronic kidney disease with stage 1 through stage 4 chronic kidney disease, or unspecified chronic kidney disease: Secondary | ICD-10-CM | POA: Diagnosis not present

## 2019-07-15 ENCOUNTER — Encounter (INDEPENDENT_AMBULATORY_CARE_PROVIDER_SITE_OTHER): Payer: Self-pay | Admitting: Ophthalmology

## 2019-07-15 ENCOUNTER — Ambulatory Visit (INDEPENDENT_AMBULATORY_CARE_PROVIDER_SITE_OTHER): Payer: Medicare PPO | Admitting: Ophthalmology

## 2019-07-15 DIAGNOSIS — I10 Essential (primary) hypertension: Secondary | ICD-10-CM | POA: Diagnosis not present

## 2019-07-15 DIAGNOSIS — H35033 Hypertensive retinopathy, bilateral: Secondary | ICD-10-CM

## 2019-07-15 DIAGNOSIS — H3581 Retinal edema: Secondary | ICD-10-CM

## 2019-07-15 DIAGNOSIS — Z961 Presence of intraocular lens: Secondary | ICD-10-CM

## 2019-07-15 DIAGNOSIS — E113313 Type 2 diabetes mellitus with moderate nonproliferative diabetic retinopathy with macular edema, bilateral: Secondary | ICD-10-CM

## 2019-07-15 MED ORDER — AFLIBERCEPT 2MG/0.05ML IZ SOLN FOR KALEIDOSCOPE
2.0000 mg | INTRAVITREAL | Status: AC | PRN
  Administered 2019-07-15: 2 mg via INTRAVITREAL

## 2019-08-03 DIAGNOSIS — M545 Low back pain: Secondary | ICD-10-CM | POA: Diagnosis not present

## 2019-08-08 NOTE — Progress Notes (Signed)
[facility] Note  08/12/2019     CHIEF COMPLAINT Patient presents for Retina Follow Up   HISTORY OF PRESENT ILLNESS: Justin [name] is a 85 y.o. male who presents to the clinic today for:   HPI    Retina Follow Up    Patient presents with  Diabetic Retinopathy.  In both eyes.  This started 3 months ago.  Since onset it is stable.  I, the attending physician,  performed the HPI with the patient and updated documentation appropriately.          Comments    F/U NPDR OU. Patient states  his vision  Is little blurry at time, denies flashes and ocular pain. Bs this am was 98 per patient, denies hypo/hyperglycemic episodes.        Last edited by [name], MD on 08/14/2019 10:55 PM. (History)    Pt states he feels like his vision is down in his left eye, he states his blood pressure was high in his PCP's office yesterday, but they did not make any changes in his medications  Referring physician: Gevena Cotton, MD [address] [place],  [place] [address]  HISTORICAL INFORMATION:   Selected notes from the [facility] Referred by Dr. [name] for concern of mac edema OU LEE:  Ocular Hx-pseudo OU; formerly followed at [facility] Dr. [name]. S/p focal laser OS x2, history of IVK and IVT OU PMH-anxiety, arthritis, CAD, DM (taking lantus and januvia), HTN, high cholesterol, prostate cancer    CURRENT MEDICATIONS: No current outpatient medications on file. (Ophthalmic Drugs)   No current facility-administered medications for this visit.  (Ophthalmic Drugs)   Current Outpatient Medications (Other)  Medication Sig  . ACCU-CHEK SMARTVIEW test strip   . acetaminophen (TYLENOL) 500 MG tablet Take 500 mg by mouth daily as needed for mild pain.  Marland Kitchen AMITIZA 24 MCG capsule Take 24 mcg by mouth as needed for constipation.   Marland Kitchen aspirin EC 81 MG tablet Take 81 mg by mouth daily.  . chlorthalidone  (HYGROTON) 25 MG tablet Take 0.5 tablets (12.5 mg total) by mouth daily.  . cilostazol (PLETAL) 100 MG tablet Take 100 mg by mouth 2 (two) times daily.  . clonazePAM (KLONOPIN) 0.5 MG tablet Take 0.5 mg by mouth daily as needed for anxiety.  . isosorbide mononitrate (IMDUR) 60 MG 24 hr tablet Take 1 tablet by mouth once daily  . JANUVIA 100 MG tablet Take 100 mg by mouth daily.   Marland Kitchen LANTUS SOLOSTAR 100 UNIT/ML Solostar Pen Inject 15-50 Units into the skin 2 (two) times daily. 50 in the morning 15 in the evening  . metoprolol (LOPRESSOR) 50 MG tablet Take 50 mg by mouth 2 (two) times daily.  . nitroGLYCERIN (NITROSTAT) 0.4 MG SL tablet Place 1 tablet (0.4 mg total) under the tongue every 5 (five) minutes as needed for chest pain.  Marland Kitchen omeprazole (PRILOSEC) 40 MG capsule Take by mouth daily.   . rosuvastatin (CRESTOR) 20 MG tablet Take 1 tablet (20 mg total) by mouth daily.  . tamsulosin (FLOMAX) 0.4 MG CAPS capsule Take 0.4 mg by mouth daily.  . vitamin B-12 (CYANOCOBALAMIN) 1000 MCG tablet Take 1,000 mcg by mouth daily.  . vitamin B-6 (PYRIDOXINE) 25 MG tablet Take 25 mg by mouth daily.  [name] 2.5 MG TABS tablet TAKE 1 TABLET TWICE DAILY   Current Facility-Administered Medications (Other)  Medication Route  . Bevacizumab (AVASTIN) SOLN  1.25 mg Intravitreal  . Bevacizumab (AVASTIN) SOLN 1.25 mg Intravitreal  . Bevacizumab (AVASTIN) SOLN 1.25 mg Intravitreal  . Bevacizumab (AVASTIN) SOLN 1.25 mg Intravitreal      REVIEW OF SYSTEMS: ROS    Positive for: Endocrine, Eyes   Negative for: Constitutional, Gastrointestinal, Neurological, Skin, Genitourinary, Musculoskeletal, HENT, Cardiovascular, Respiratory, Psychiatric, Allergic/Imm, Heme/Lymph   Last edited by Zenovia Jordan, LPN on 27/11/5007  3:81 AM. (History)       ALLERGIES Allergies  Allergen Reactions  . Dye Fdc Red [Red Dye] Swelling and Other (See Comments)    CAT scan dye  . Ibuprofen Other (See Comments)    Upset  stomach     PAST MEDICAL HISTORY Past Medical History:  Diagnosis Date  . Anxiety   . Arthritis   . Coronary artery disease   . Diabetes mellitus without complication ([place])   . Diabetic retinopathy ([place])    NPDR OU  . Diverticulitis   . Dyspnea   . GERD (gastroesophageal reflux disease)   . Hypercholesteremia   . Hypertension   . Hypertensive retinopathy    OU  . Pneumonia    as a child  . Prostate cancer ([place])   . UTI (lower urinary tract infection)    Past Surgical History:  Procedure Laterality Date  . ABDOMINAL SURGERY     for diverticulitis; also removed appendix  . CATARACT EXTRACTION    . CATARACT EXTRACTION, BILATERAL    . CHOLECYSTECTOMY N/A 10/12/2017   Procedure: LAPAROSCOPIC CHOLECYSTECTOMY;  Surgeon: [name], MD;  Location: [place] [facility];  Service: General;  Laterality: N/A;  . CORONARY ARTERY BYPASS GRAFT  2009  . ERCP N/A 12/21/2018   Procedure: ENDOSCOPIC RETROGRADE CHOLANGIOPANCREATOGRAPHY (ERCP);  Surgeon: [name], MD;  Location: [facility];  Service: Endoscopy;  Laterality: N/A;  . ESOPHAGOGASTRODUODENOSCOPY (EGD) WITH PROPOFOL N/A 12/17/2018   Procedure: ESOPHAGOGASTRODUODENOSCOPY (EGD) WITH PROPOFOL;  Surgeon: [name], MD;  Location: [facility];  Service: Endoscopy;  Laterality: N/A;  . EUS Left 12/17/2018   Procedure: UPPER ENDOSCOPIC ULTRASOUND (EUS) LINEAR;  Surgeon: [name], MD;  Location: [facility];  Service: Endoscopy;  Laterality: Left;  . EYE SURGERY     "for bleeding in eye"  . HERNIA REPAIR    . LEFT HEART CATH AND CORONARY ANGIOGRAPHY N/A 11/04/2016   Procedure: Left Heart Cath and Coronary Angiography;  Surgeon: [name], MD;  Location: [facility] CV [facility];  Service: Cardiovascular;  Laterality: N/A;  . LOWER EXTREMITY ANGIOGRAPHY N/A 11/04/2016   Procedure: Lower Extremity Angiography;  Surgeon: [name], MD;  Location: [facility];  Service: Cardiovascular;  Laterality: N/A;  . PROSTATE SURGERY    . REMOVAL  OF STONES  12/21/2018   Procedure: REMOVAL OF STONES;  Surgeon: [name], MD;  Location: [facility];  Service: Endoscopy;;  . [name]  12/21/2018   Procedure: [name];  Surgeon: [name], MD;  Location[facility];  Service: Endoscopy;;    FAMILY HISTORY Family History  Problem Relation Age of Onset  . Diabetes Father   . Diabetes Maternal Aunt   . Diabetes Maternal Uncle   . Diabetes Maternal Grandmother   . Heart disease Sister   . Diabetes Brother   . Heart disease Sister     SOCIAL HISTORY Social History   Tobacco Use  . Smoking status: Former Smoker    Years: 2.00  . Smokeless tobacco: Never Used  . Tobacco comment: when he was a teenage age [age]  Substance Use Topics  .  Alcohol use: No  . Drug use: No         OPHTHALMIC EXAM:  Base Eye Exam    Visual Acuity (Snellen - Linear)      Right Left   Dist cc 20/40 +2 20/60 +1   Dist ph cc NI NI   Correction: Glasses       Tonometry (Tonopen, 9:25 AM)      Right Left   Pressure 13 15       Pupils      Dark Light Shape React APD   Right 3 2 Round Brisk None   Left 3 2 Round Brisk None       Visual Fields (Counting fingers)      Left Right    Full Full       Extraocular Movement      Right Left    Full, Ortho Full, Ortho       Neuro/Psych    Oriented x3: Yes   Mood/Affect: Normal       Dilation    Both eyes: 1.0% Mydriacyl, 2.5% Phenylephrine @ 9:21 AM        Slit Lamp and Fundus Exam    Slit Lamp Exam      Right Left   Lids/Lashes Dermatochalasis - upper lid, Meibomian gland dysfunction Dermatochalasis - upper lid, Meibomian gland dysfunction   Conjunctiva/Sclera Melanosis Melanosis   Cornea Arcus, 1+ Punctate epithelial erosions Arcus, 1+ Punctate epithelial erosions   Anterior Chamber Deep and quiet Deep and quiet   Iris Mild Temporal Iris atrophy, Round and dilated, No NVI Mild Temporal Iris atrophy, Round and dilated, No NVI   Lens PC IOL in good position  with mild aterior capsule fimosis PC IOL in good position with mild aterior capsule fimosis   Vitreous Vitreous syneresis, Posterior vitreous detachment, vitreous condensations, old heme clearing and settling inferiorly Vitreous syneresis, refractile deposits in posterior hyaloid, mild Asteroid hyalosis, persistent blood stained vitreous inferiorly.       Fundus Exam      Right Left   Disc Pink and Sharp, Peripapillary atrophy pink and shap, Tilted disc, mild temporal Temporal Peripapillary atrophy   C/D Ratio 0.2 0.3   Macula Blunted foveal reflex, Retinal pigment epithelial mottling and clumping, Microaneurysms -- improved, persistent central cystic changes -- slightly increased Blunted foveal reflex, focal area of RPE atrophy SN to fovea, Microaneurysms, Retinal pigment epithelial mottling, refractile Epiretinal membrane, persistent central cystic changes    Vessels Vascular attenuation, Tortuous Tortuous, Vascular attenuation   Periphery Attached, scattered MA, good peripheral PRP 360 - room for posterior fill in if needed Attached, scattered IRH.  Peripheral PRP 360.          IMAGING AND PROCEDURES  Imaging and Procedures for _0 @  OCT, Retina - OU - Both Eyes       Right Eye Quality was good. Central Foveal Thickness: 370. Progression has worsened. Findings include abnormal foveal contour, intraretinal fluid, no SRF, retinal drusen , outer retinal atrophy (Interval increase in IRF).   Left Eye Quality was good. Central Foveal Thickness: 290. Progression has been stable. Findings include abnormal foveal contour, no SRF, intraretinal fluid, outer retinal atrophy, retinal drusen  (Persistent IRF).   Notes *Images captured and stored on drive  Diagnosis / Impression:  DME OU, OD>OS OD - interval increase in IRF  OS - persistent IRF/cystic changes.  Clinical management:  See below  Abbreviations: NFP - Normal foveal profile. CME - cystoid macular edema. PED -  pigment  epithelial detachment. IRF - intraretinal fluid. SRF - subretinal fluid. EZ - ellipsoid zone. ERM - epiretinal membrane. ORA - outer retinal atrophy. ORT - outer retinal tubulation. SRHM - subretinal hyper-reflective material         Intravitreal Injection, Pharmacologic Agent - OD - Right Eye       Time Out 08/12/2019. 10:36 AM. Confirmed correct patient, procedure, site, and patient consented.   Anesthesia Topical anesthesia was used. Anesthetic medications included Lidocaine 2%, Proparacaine 0.5%.   Procedure Preparation included 5% betadine to ocular surface, eyelid speculum. A 30 gauge needle was used.   Injection:  2 mg aflibercept Alfonse Flavors) SOLN   NDC: A3590391, Lot: [id number], Expiration date: 12/24/2019   Route: Intravitreal, Site: Right Eye, Waste: 0.05 mL  Post-op Post injection exam found visual acuity of at least counting fingers. The patient tolerated the procedure well. There were no complications. The patient received written and verbal post procedure care education.        Intravitreal Injection, Pharmacologic Agent - OS - Left Eye       Time Out 08/12/2019. 10:39 AM. Confirmed correct patient, procedure, site, and patient consented.   Anesthesia Topical anesthesia was used. Anesthetic medications included Lidocaine 2%, Proparacaine 0.5%.   Procedure Preparation included 5% betadine to ocular surface, eyelid speculum. A 30 gauge needle was used.   Injection:  2 mg aflibercept Alfonse Flavors) SOLN   NDC: A3590391, Lot: [id number], Expiration date: 01/24/2020   Route: Intravitreal, Site: Left Eye, Waste: 0.05 mL  Post-op Post injection exam found visual acuity of at least counting fingers. The patient tolerated the procedure well. There were no complications. The patient received written and verbal post procedure care education.                 ASSESSMENT/PLAN:    ICD-10-CM   1. Moderate nonproliferative diabetic retinopathy of both eyes with  macular edema associated with type 2 diabetes mellitus (HCC)  P32.9518 Intravitreal Injection, Pharmacologic Agent - OD - Right Eye    Intravitreal Injection, Pharmacologic Agent - OS - Left Eye    aflibercept (EYLEA) SOLN 2 mg    aflibercept (EYLEA) SOLN 2 mg  2. Retinal edema  H35.81 OCT, Retina - OU - Both Eyes  3. Essential hypertension  I10   4. Hypertensive retinopathy of both eyes  H35.033   5. Pseudophakia of both eyes  Z96.1     1,2. Moderate non-proliferative diabetic retinopathy with edema OU   - moved to [place] from [place], [place] -- history of prior injections OS with Dr. [name]  - records received and reviewed from [facility] [place] (Dr. [name])  - history of focal laser OS x2 and IVK/IVT OU in [place] -- last injection was IVK OS November 2013  - initial exam: scattered IRH, DBH and +macular edema  - initial OCT: diabetic macular edema, both eyes, (OD > OS)  - FA (10.1.19) showed late leaking MA OU -- no NV  - delayed f/u on 4.16.20 due to hospitalization for sepsis / gallstones  - S/P PRP OD (07.14.20) - good laser surrounding, room for posterior fill in if needed  - S/P PRP OS (08.26.20)  - S/P IVA OD #1 (09.17.19), #2 (10.29.19), #3 (11.26.19), #4 (01.02.20), #5 (01.30.20), #6 (02.28.20), #7 (04.16.20), #8 (06.11.20), #9 (08.12.20)  - S/P IVA OS #1 (10.01.19), #2 (10.29.19), #3 (11.26.19), #4 (01.02.20), #5 (01.30.20), #6 (02.28.20), #7 (04.16.20), #8 (05.14.20), #9 (06.11.20), #10 (07.09.20), #11 (08.12.20)  - S/P IVK OD #  1 (05.14.20) -- no significant improvement  - S/P IVE OS #1 (09.16.20), #2 (10.16.20)  - S/P IVE OD #1 (09.16.20), #2 (10.16.20)  - switched back to Avastin OD (#9 6.11.2020)  - repeated Eylea4U benefits investigation due to possible IVA resistance (8.12.20)  - Eylea4U benefits investigation re-initiated 8.12.2020 -- approved for Eylea coverage and Good Days  - OCT today shows interval increase in IRF OD, persistent IRF OS  - BCVA stable at  20/30 OU  - recommend IVE OU #3 today, 11.13.20 -- pt wishes to proceed  - RBA of procedure discussed, questions answered  - informed consent obtained and signed  - see procedure note  - f/u 4 weeks, DFE, OCT, possible injection  3,4. Hypertensive retinopathy OU  - discussed importance of tight BP control  - monitor  5. Pseudophakia OU  - s/p CE/IOL OU in [place], [place]  - beautiful surgeries, doing well  - monitor   Ophthalmic Meds Ordered this visit:  Meds ordered this encounter  Medications  . aflibercept (EYLEA) SOLN 2 mg  . aflibercept (EYLEA) SOLN 2 mg       Return in about 4 weeks (around 09/09/2019) for f/u NPDR OU, DFE, OCT.  There are no Patient Instructions on file for this visit.   Explained the diagnoses, plan, and follow up with the patient and they expressed understanding.  Patient expressed understanding of the importance of proper follow up care.   This document serves as a record of services personally performed by Gardiner Sleeper, MD, PhD. It was created on their behalf by Ernest Mallick, OA, an ophthalmic assistant. The creation of this record is the provider's dictation and/or activities during the visit.    Electronically signed by: Ernest Mallick, OA 11.09.2020 10:58 PM  Gardiner Sleeper, M.D., Ph.D. Diseases & Surgery of the Retina and Vitreous [facility]  I have reviewed the above documentation for accuracy and completeness, and I agree with the above. Gardiner Sleeper, M.D., Ph.D. 08/14/19 10:58 PM   Abbreviations: M myopia (nearsighted); A astigmatism; H hyperopia (farsighted); P presbyopia; Mrx spectacle prescription;  CTL contact lenses; OD right eye; OS left eye; OU both eyes  XT exotropia; ET esotropia; PEK punctate epithelial keratitis; PEE punctate epithelial erosions; DES dry eye syndrome; MGD meibomian gland dysfunction; ATs artificial tears; PFAT's preservative free artificial tears; [place] nuclear sclerotic cataract; PSC  posterior subcapsular cataract; ERM epi-retinal membrane; PVD posterior vitreous detachment; RD retinal detachment; DM diabetes mellitus; DR diabetic retinopathy; NPDR non-proliferative diabetic retinopathy; PDR proliferative diabetic retinopathy; CSME clinically significant macular edema; DME diabetic macular edema; dbh dot blot hemorrhages; CWS cotton wool spot; POAG primary open angle glaucoma; C/D cup-to-disc ratio; HVF humphrey visual field; GVF goldmann visual field; OCT optical coherence tomography; IOP intraocular pressure; BRVO Branch retinal vein occlusion; CRVO central retinal vein occlusion; CRAO central retinal artery occlusion; BRAO branch retinal artery occlusion; RT retinal tear; SB scleral buckle; PPV pars plana vitrectomy; VH Vitreous hemorrhage; PRP panretinal laser photocoagulation; IVK intravitreal kenalog; VMT vitreomacular traction; MH Macular hole;  NVD neovascularization of the disc; NVE neovascularization elsewhere; AREDS age related eye disease study; ARMD age related macular degeneration; POAG primary open angle glaucoma; EBMD epithelial/anterior basement membrane dystrophy; ACIOL anterior chamber intraocular lens; IOL intraocular lens; PCIOL posterior chamber intraocular lens; Phaco/IOL phacoemulsification with intraocular lens placement; [place] photorefractive keratectomy; LASIK laser assisted in situ keratomileusis; HTN hypertension; DM diabetes mellitus; COPD chronic obstructive pulmonary disease

## 2019-08-11 DIAGNOSIS — Z0001 Encounter for general adult medical examination with abnormal findings: Secondary | ICD-10-CM | POA: Diagnosis not present

## 2019-08-11 DIAGNOSIS — E782 Mixed hyperlipidemia: Secondary | ICD-10-CM | POA: Diagnosis not present

## 2019-08-11 DIAGNOSIS — I2510 Atherosclerotic heart disease of native coronary artery without angina pectoris: Secondary | ICD-10-CM | POA: Diagnosis not present

## 2019-08-11 DIAGNOSIS — E118 Type 2 diabetes mellitus with unspecified complications: Secondary | ICD-10-CM | POA: Diagnosis not present

## 2019-08-11 DIAGNOSIS — N400 Enlarged prostate without lower urinary tract symptoms: Secondary | ICD-10-CM | POA: Diagnosis not present

## 2019-08-11 DIAGNOSIS — I10 Essential (primary) hypertension: Secondary | ICD-10-CM | POA: Diagnosis not present

## 2019-08-11 DIAGNOSIS — F419 Anxiety disorder, unspecified: Secondary | ICD-10-CM | POA: Diagnosis not present

## 2019-08-11 DIAGNOSIS — R1013 Epigastric pain: Secondary | ICD-10-CM | POA: Diagnosis not present

## 2019-08-11 DIAGNOSIS — Z0101 Encounter for examination of eyes and vision with abnormal findings: Secondary | ICD-10-CM | POA: Diagnosis not present

## 2019-08-12 ENCOUNTER — Encounter (INDEPENDENT_AMBULATORY_CARE_PROVIDER_SITE_OTHER): Payer: Self-pay | Admitting: Ophthalmology

## 2019-08-12 ENCOUNTER — Ambulatory Visit (INDEPENDENT_AMBULATORY_CARE_PROVIDER_SITE_OTHER): Payer: Medicare PPO | Admitting: Ophthalmology

## 2019-08-12 DIAGNOSIS — E113313 Type 2 diabetes mellitus with moderate nonproliferative diabetic retinopathy with macular edema, bilateral: Secondary | ICD-10-CM

## 2019-08-12 DIAGNOSIS — H35033 Hypertensive retinopathy, bilateral: Secondary | ICD-10-CM | POA: Diagnosis not present

## 2019-08-12 DIAGNOSIS — I10 Essential (primary) hypertension: Secondary | ICD-10-CM | POA: Diagnosis not present

## 2019-08-12 DIAGNOSIS — Z961 Presence of intraocular lens: Secondary | ICD-10-CM | POA: Diagnosis not present

## 2019-08-12 DIAGNOSIS — H3581 Retinal edema: Secondary | ICD-10-CM

## 2019-08-14 ENCOUNTER — Encounter (INDEPENDENT_AMBULATORY_CARE_PROVIDER_SITE_OTHER): Payer: Self-pay | Admitting: Ophthalmology

## 2019-08-14 MED ORDER — AFLIBERCEPT 2MG/0.05ML IZ SOLN FOR KALEIDOSCOPE
2.0000 mg | INTRAVITREAL | Status: AC | PRN
  Administered 2019-08-14: 2 mg via INTRAVITREAL

## 2019-09-08 NOTE — Progress Notes (Signed)
[facility] Note  09/09/2019     CHIEF COMPLAINT Patient presents for Retina Follow Up   HISTORY OF PRESENT ILLNESS: Justin [name] is a 85 y.o. male who presents to the clinic today for:   HPI    Retina Follow Up    Patient presents with  Diabetic Retinopathy.  In both eyes.  This started weeks ago.  Severity is moderate.  Duration of weeks.  Since onset it is stable.  I, the attending physician,  performed the HPI with the patient and updated documentation appropriately.          Comments    Pt states his vision is stable OU.  Patient complains of a significant amount of itching and watering OU.  Patient denies eye pain.  Pt denies any new or worsening floaters or fol OU.       Last edited by [name], MD on 09/10/2019 11:02 PM. (History)    Pt states he feels like his vision is down in his left eye, he states his blood pressure was high in his PCP's office yesterday, but they did not make any changes in his medications  Referring physician: [name], MD [address] [place],  [place] [address]  HISTORICAL INFORMATION:   Selected notes from the [facility] Referred by Dr. [name] for concern of mac edema OU LEE:  Ocular Hx-pseudo OU; formerly followed at [facility] Dr. [name]. S/p focal laser OS x2, history of IVK and IVT OU PMH-anxiety, arthritis, CAD, DM (taking lantus and januvia), HTN, high cholesterol, prostate cancer    CURRENT MEDICATIONS: No current outpatient medications on file. (Ophthalmic Drugs)   No current facility-administered medications for this visit. (Ophthalmic Drugs)   Current Outpatient Medications (Other)  Medication Sig  . ACCU-CHEK SMARTVIEW test strip   . acetaminophen (TYLENOL) 500 MG tablet Take 500 mg by mouth daily as needed for mild pain.  Marland Kitchen AMITIZA 24 MCG capsule Take 24 mcg by mouth as needed for constipation.   Marland Kitchen aspirin EC 81 MG tablet  Take 81 mg by mouth daily.  . chlorthalidone (HYGROTON) 25 MG tablet Take 0.5 tablets (12.5 mg total) by mouth daily.  . cilostazol (PLETAL) 100 MG tablet Take 100 mg by mouth 2 (two) times daily.  . clonazePAM (KLONOPIN) 0.5 MG tablet Take 0.5 mg by mouth daily as needed for anxiety.  . isosorbide mononitrate (IMDUR) 60 MG 24 hr tablet Take 1 tablet by mouth once daily  . JANUVIA 100 MG tablet Take 100 mg by mouth daily.   Marland Kitchen LANTUS SOLOSTAR 100 UNIT/ML Solostar Pen Inject 15-50 Units into the skin 2 (two) times daily. 50 in the morning 15 in the evening  . metoprolol (LOPRESSOR) 50 MG tablet Take 50 mg by mouth 2 (two) times daily.  . nitroGLYCERIN (NITROSTAT) 0.4 MG SL tablet Place 1 tablet (0.4 mg total) under the tongue every 5 (five) minutes as needed for chest pain.  Marland Kitchen omeprazole (PRILOSEC) 40 MG capsule Take by mouth daily.   . rosuvastatin (CRESTOR) 20 MG tablet Take 1 tablet (20 mg total) by mouth daily.  . tamsulosin (FLOMAX) 0.4 MG CAPS capsule Take 0.4 mg by mouth daily.  . vitamin B-12 (CYANOCOBALAMIN) 1000 MCG tablet Take 1,000 mcg by mouth daily.  . vitamin B-6 (PYRIDOXINE) 25 MG tablet Take 25 mg by mouth daily.  [name] 2.5 MG TABS tablet TAKE 1 TABLET TWICE DAILY   Current  Facility-Administered Medications (Other)  Medication Route  . Bevacizumab (AVASTIN) SOLN 1.25 mg Intravitreal  . Bevacizumab (AVASTIN) SOLN 1.25 mg Intravitreal  . Bevacizumab (AVASTIN) SOLN 1.25 mg Intravitreal  . Bevacizumab (AVASTIN) SOLN 1.25 mg Intravitreal      REVIEW OF SYSTEMS: ROS    Positive for: Endocrine, Eyes   Negative for: Constitutional, Gastrointestinal, Neurological, Skin, Genitourinary, Musculoskeletal, HENT, Cardiovascular, Respiratory, Psychiatric, Allergic/Imm, Heme/Lymph   Last edited by Doneen Poisson on 09/09/2019  9:29 AM. (History)       ALLERGIES Allergies  Allergen Reactions  . Dye Fdc Red [Red Dye] Swelling and Other (See Comments)    CAT scan dye  .  Ibuprofen Other (See Comments)    Upset stomach     PAST MEDICAL HISTORY Past Medical History:  Diagnosis Date  . Anxiety   . Arthritis   . Coronary artery disease   . Diabetes mellitus without complication ([place])   . Diabetic retinopathy ([place])    NPDR OU  . Diverticulitis   . Dyspnea   . GERD (gastroesophageal reflux disease)   . Hypercholesteremia   . Hypertension   . Hypertensive retinopathy    OU  . Pneumonia    as a child  . Prostate cancer ([place])   . UTI (lower urinary tract infection)    Past Surgical History:  Procedure Laterality Date  . ABDOMINAL SURGERY     for diverticulitis; also removed appendix  . CATARACT EXTRACTION    . CATARACT EXTRACTION, BILATERAL    . CHOLECYSTECTOMY N/A 10/12/2017   Procedure: LAPAROSCOPIC CHOLECYSTECTOMY;  Surgeon: [name], MD;  Location: [facility];  Service: General;  Laterality: N/A;  . CORONARY ARTERY BYPASS GRAFT  2009  . ERCP N/A 12/21/2018   Procedure: ENDOSCOPIC RETROGRADE CHOLANGIOPANCREATOGRAPHY (ERCP);  Surgeon: [name], MD;  Location: [facility];  Service: Endoscopy;  Laterality: N/A;  . ESOPHAGOGASTRODUODENOSCOPY (EGD) WITH PROPOFOL N/A 12/17/2018   Procedure: ESOPHAGOGASTRODUODENOSCOPY (EGD) WITH PROPOFOL;  Surgeon: [name], MD;  Location: [facility];  Service: Endoscopy;  Laterality: N/A;  . EUS Left 12/17/2018   Procedure: UPPER ENDOSCOPIC ULTRASOUND (EUS) LINEAR;  Surgeon: [name], MD;  Location: [facility];  Service: Endoscopy;  Laterality: Left;  . EYE SURGERY     "for bleeding in eye"  . HERNIA REPAIR    . LEFT HEART CATH AND CORONARY ANGIOGRAPHY N/A 11/04/2016   Procedure: Left Heart Cath and Coronary Angiography;  Surgeon: [name], MD;  Location: [facility];  Service: Cardiovascular;  Laterality: N/A;  . LOWER EXTREMITY ANGIOGRAPHY N/A 11/04/2016   Procedure: Lower Extremity Angiography;  Surgeon: [name], MD;  Location: [facility];  Service: Cardiovascular;  Laterality:  N/A;  . PROSTATE SURGERY    . REMOVAL OF STONES  12/21/2018   Procedure: REMOVAL OF STONES;  Surgeon: [name], MD;  Location: [facility];  Service: Endoscopy;;  . [name]  12/21/2018   Procedure: [name];  Surgeon: [name], MD;  Location: [facility] ENDOSCOPY;  Service: Endoscopy;;    FAMILY HISTORY Family History  Problem Relation Age of Onset  . Diabetes Father   . Diabetes Maternal Aunt   . Diabetes Maternal Uncle   . Diabetes Maternal Grandmother   . Heart disease Sister   . Diabetes Brother   . Heart disease Sister     SOCIAL HISTORY Social History   Tobacco Use  . Smoking status: Former Smoker    Years: 2.00  . Smokeless tobacco: Never Used  . Tobacco comment: when  he was a teenage age [age]  Substance Use Topics  . Alcohol use: No  . Drug use: No         OPHTHALMIC EXAM:  Base Eye Exam    Visual Acuity (Snellen - Linear)      Right Left   Dist cc 20/25 -1 20/40 +1   Correction: Glasses       Tonometry (Tonopen, 9:33 AM)      Right Left   Pressure 11 12       Pupils      Dark Light Shape React APD   Right 3 2 Round Minimal 0   Left 3 2 Round Minimal 0       Visual Fields      Left Right    Full Full       Extraocular Movement      Right Left    Full Full       Neuro/Psych    Oriented x3: Yes   Mood/Affect: Normal       Dilation    Both eyes: 1.0% Mydriacyl, 2.5% Phenylephrine @ 9:33 AM        Slit Lamp and Fundus Exam    Slit Lamp Exam      Right Left   Lids/Lashes Dermatochalasis - upper lid, Meibomian gland dysfunction Dermatochalasis - upper lid, Meibomian gland dysfunction   Conjunctiva/Sclera Melanosis Melanosis   Cornea Arcus, 1+ Punctate epithelial erosions Arcus, 1+ Punctate epithelial erosions   Anterior Chamber Deep and quiet Deep and quiet   Iris Mild Temporal Iris atrophy, Round and dilated, No NVI Mild Temporal Iris atrophy, Round and dilated, No NVI   Lens PC IOL in good position with mild  aterior capsule fimosis PC IOL in good position with mild aterior capsule fimosis   Vitreous Vitreous syneresis, Posterior vitreous detachment, vitreous condensations, old heme clearing and settling inferiorly Vitreous syneresis, refractile deposits in posterior hyaloid, mild Asteroid hyalosis, persistent blood stained vitreous inferiorly., vitreous condensations       Fundus Exam      Right Left   Disc Pink and Sharp, Peripapillary atrophy pink and shap, Tilted disc, mild temporal Temporal Peripapillary atrophy   C/D Ratio 0.2 0.3   Macula Blunted foveal reflex, Retinal pigment epithelial mottling and clumping, Microaneurysms -- improved, persistent central cystic changes  Blunted foveal reflex, focal area of RPE atrophy SN to fovea, Microaneurysms, Retinal pigment epithelial mottling, refractile Epiretinal membrane, persistent central cystic changes    Vessels Vascular attenuation, Tortuous Tortuous, Vascular attenuation   Periphery Attached, scattered MA, good peripheral PRP 360 - room for posterior fill in if needed Attached, scattered IRH.  Peripheral PRP 360.        Refraction    Wearing Rx      Sphere Cylinder Axis Add   Right +0.25 +0.50 010 +3.00   Left [place] +0.75 173 +3.00   Type: Bifocal          IMAGING AND PROCEDURES  Imaging and Procedures for _0 @  OCT, Retina - OU - Both Eyes       Right Eye Quality was good. Central Foveal Thickness: 386. Progression has been stable. Findings include abnormal foveal contour, intraretinal fluid, no SRF, retinal drusen , outer retinal atrophy (Persistent IRF).   Left Eye Quality was good. Central Foveal Thickness: 291. Progression has been stable. Findings include abnormal foveal contour, no SRF, intraretinal fluid, outer retinal atrophy, retinal drusen  (Persistent IRF).   Notes *Images captured and stored on drive  Diagnosis / Impression:  DME OU, OD>OS Persistent IRF OU  Clinical management:  See  below  Abbreviations: NFP - Normal foveal profile. CME - cystoid macular edema. PED - pigment epithelial detachment. IRF - intraretinal fluid. SRF - subretinal fluid. EZ - ellipsoid zone. ERM - epiretinal membrane. ORA - outer retinal atrophy. ORT - outer retinal tubulation. SRHM - subretinal hyper-reflective material         Intravitreal Injection, Pharmacologic Agent - OD - Right Eye       Time Out 09/09/2019. 10:19 AM. Confirmed correct patient, procedure, site, and patient consented.   Anesthesia Topical anesthesia was used. Anesthetic medications included Lidocaine 2%, Proparacaine 0.5%.   Procedure Preparation included 5% betadine to ocular surface, eyelid speculum. A supplied (32 g) needle was used.   Injection:  2 mg aflibercept Alfonse Flavors) SOLN   NDC: A3590391, Lot: [id number], Expiration date: 03/23/2020   Route: Intravitreal, Site: Right Eye, Waste: 0.05 mL  Post-op Post injection exam found visual acuity of at least counting fingers. The patient tolerated the procedure well. There were no complications. The patient received written and verbal post procedure care education.        Intravitreal Injection, Pharmacologic Agent - OS - Left Eye       Time Out 09/09/2019. 10:20 AM. Confirmed correct patient, procedure, site, and patient consented.   Anesthesia Topical anesthesia was used. Anesthetic medications included Lidocaine 2%, Proparacaine 0.5%.   Procedure Preparation included 5% betadine to ocular surface, eyelid speculum. A supplied (32g) needle was used.   Injection:  2 mg aflibercept Alfonse Flavors) SOLN   NDC: A3590391, Lot: [id number], Expiration date: 02/23/2020   Route: Intravitreal, Site: Left Eye, Waste: 0.05 mL  Post-op Post injection exam found visual acuity of at least counting fingers. The patient tolerated the procedure well. There were no complications. The patient received written and verbal post procedure care education.                  ASSESSMENT/PLAN:    ICD-10-CM   1. Moderate nonproliferative diabetic retinopathy of both eyes with macular edema associated with type 2 diabetes mellitus (HCC)  J69.6789 Intravitreal Injection, Pharmacologic Agent - OD - Right Eye    Intravitreal Injection, Pharmacologic Agent - OS - Left Eye    aflibercept (EYLEA) SOLN 2 mg    aflibercept (EYLEA) SOLN 2 mg  2. Retinal edema  H35.81 OCT, Retina - OU - Both Eyes  3. Essential hypertension  I10   4. Hypertensive retinopathy of both eyes  H35.033   5. Pseudophakia of both eyes  Z96.1     1,2. Moderate non-proliferative diabetic retinopathy with edema OU   - moved to [place] from [place], [place] -- history of prior injections OS with Dr. [name]  - records received and reviewed from [facility] (Dr. [name])  - history of focal laser OS x2 and IVK/IVT OU in [place] -- last injection was IVK OS November 2013  - initial exam: scattered IRH, DBH and +macular edema  - initial OCT: diabetic macular edema, both eyes, (OD > OS)  - FA (10.1.19) showed late leaking MA OU -- no NV  - delayed f/u on 4.16.20 due to hospitalization for sepsis / gallstones  - S/P PRP OD (07.14.20) - good laser surrounding, room for posterior fill in if needed  - S/P PRP OS (08.26.20)  - S/P IVA OD #1 (09.17.19), #2 (10.29.19), #3 (11.26.19), #4 (01.02.20), #5 (01.30.20), #6 (02.28.20), #7 (04.16.20), #8 (06.11.20), #9 (08.12.20)  -  S/P IVA OS #1 (10.01.19), #2 (10.29.19), #3 (11.26.19), #4 (01.02.20), #5 (01.30.20), #6 (02.28.20), #7 (04.16.20), #8 (05.14.20), #9 (06.11.20), #10 (07.09.20), #11 (08.12.20)  - S/P IVK OD #1 (05.14.20) -- no significant improvement  - switched back to Avastin OD (#9 6.11.2020)  - S/P IVE OS #1 (09.16.20), #2 (10.16.20), #3 (11.13.20)  - S/P IVE OD #1 (09.16.20), #2 (10.16.20), #3 (11.13.20)  - repeated Eylea4U benefits investigation due to possible IVA resistance (8.12.20)  - Eylea4U benefits investigation re-initiated  8.12.2020 -- approved for Eylea coverage and Good Days  - OCT today shows persistent IRF OU  - BCVA: OD improved from 20/40 to 20/25 and OS improved from 20/60 to 20/40  - recommend IVE OU #4 today, 12.11.20 -- pt wishes to proceed  - RBA of procedure discussed, questions answered  - informed consent obtained and signed  - see procedure note  - f/u 4 weeks, DFE, OCT, possible injection  3,4. Hypertensive retinopathy OU  - discussed importance of tight BP control  - monitor  5. Pseudophakia OU  - s/p CE/IOL OU in [place], [place]  - beautiful surgeries, doing well  - monitor   Ophthalmic Meds Ordered this visit:  Meds ordered this encounter  Medications  . aflibercept (EYLEA) SOLN 2 mg  . aflibercept (EYLEA) SOLN 2 mg       Return in about 4 weeks (around 10/07/2019) for f/u NPDR OU, DFE, OCT.  There are no Patient Instructions on file for this visit.   Explained the diagnoses, plan, and follow up with the patient and they expressed understanding.  Patient expressed understanding of the importance of proper follow up care.   This document serves as a record of services personally performed by Gardiner Sleeper, MD, PhD. It was created on their behalf by Ernest Mallick, OA, an ophthalmic assistant. The creation of this record is the provider's dictation and/or activities during the visit.    Electronically signed by: Ernest Mallick, OA 12.10.2020 11:08 PM  Gardiner Sleeper, M.D., Ph.D. Diseases & Surgery of the Retina and Vitreous [facility]  I have reviewed the above documentation for accuracy and completeness, and I agree with the above. Gardiner Sleeper, M.D., Ph.D. 09/10/19 11:08 PM    Abbreviations: M myopia (nearsighted); A astigmatism; H hyperopia (farsighted); P presbyopia; Mrx spectacle prescription;  CTL contact lenses; OD right eye; OS left eye; OU both eyes  XT exotropia; ET esotropia; PEK punctate epithelial keratitis; PEE punctate epithelial  erosions; DES dry eye syndrome; MGD meibomian gland dysfunction; ATs artificial tears; PFAT's preservative free artificial tears; [place] nuclear sclerotic cataract; PSC posterior subcapsular cataract; ERM epi-retinal membrane; PVD posterior vitreous detachment; RD retinal detachment; DM diabetes mellitus; DR diabetic retinopathy; NPDR non-proliferative diabetic retinopathy; PDR proliferative diabetic retinopathy; CSME clinically significant macular edema; DME diabetic macular edema; dbh dot blot hemorrhages; CWS cotton wool spot; POAG primary open angle glaucoma; C/D cup-to-disc ratio; HVF humphrey visual field; GVF goldmann visual field; OCT optical coherence tomography; IOP intraocular pressure; BRVO Branch retinal vein occlusion; CRVO central retinal vein occlusion; CRAO central retinal artery occlusion; BRAO branch retinal artery occlusion; RT retinal tear; SB scleral buckle; PPV pars plana vitrectomy; VH Vitreous hemorrhage; PRP panretinal laser photocoagulation; IVK intravitreal kenalog; VMT vitreomacular traction; MH Macular hole;  NVD neovascularization of the disc; NVE neovascularization elsewhere; AREDS age related eye disease study; ARMD age related macular degeneration; POAG primary open angle glaucoma; EBMD epithelial/anterior basement membrane dystrophy; ACIOL anterior chamber intraocular lens; IOL  intraocular lens; PCIOL posterior chamber intraocular lens; Phaco/IOL phacoemulsification with intraocular lens placement; [place] photorefractive keratectomy; LASIK laser assisted in situ keratomileusis; HTN hypertension; DM diabetes mellitus; COPD chronic obstructive pulmonary disease

## 2019-09-09 ENCOUNTER — Other Ambulatory Visit: Payer: Self-pay

## 2019-09-09 ENCOUNTER — Ambulatory Visit (INDEPENDENT_AMBULATORY_CARE_PROVIDER_SITE_OTHER): Payer: Medicare PPO | Admitting: Ophthalmology

## 2019-09-09 DIAGNOSIS — E113313 Type 2 diabetes mellitus with moderate nonproliferative diabetic retinopathy with macular edema, bilateral: Secondary | ICD-10-CM | POA: Diagnosis not present

## 2019-09-09 DIAGNOSIS — Z961 Presence of intraocular lens: Secondary | ICD-10-CM | POA: Diagnosis not present

## 2019-09-09 DIAGNOSIS — H35033 Hypertensive retinopathy, bilateral: Secondary | ICD-10-CM | POA: Diagnosis not present

## 2019-09-09 DIAGNOSIS — I10 Essential (primary) hypertension: Secondary | ICD-10-CM | POA: Diagnosis not present

## 2019-09-09 DIAGNOSIS — H3581 Retinal edema: Secondary | ICD-10-CM

## 2019-09-10 ENCOUNTER — Encounter (INDEPENDENT_AMBULATORY_CARE_PROVIDER_SITE_OTHER): Payer: Self-pay | Admitting: Ophthalmology

## 2019-09-10 MED ORDER — AFLIBERCEPT 2MG/0.05ML IZ SOLN FOR KALEIDOSCOPE
2.0000 mg | INTRAVITREAL | Status: AC | PRN
  Administered 2019-09-10: 2 mg via INTRAVITREAL

## 2019-09-13 ENCOUNTER — Other Ambulatory Visit: Payer: Self-pay | Admitting: Cardiology

## 2019-09-26 ENCOUNTER — Telehealth: Payer: Self-pay | Admitting: Hematology

## 2019-09-26 NOTE — Telephone Encounter (Signed)
Returned patient's phone call regarding cancelling 12/31 and 01/08 appointments, per 12/28 scheduled message appointment cancelled.

## 2019-09-29 ENCOUNTER — Other Ambulatory Visit: Payer: Medicare PPO

## 2019-09-30 DIAGNOSIS — Z87442 Personal history of urinary calculi: Secondary | ICD-10-CM

## 2019-09-30 HISTORY — DX: Personal history of urinary calculi: Z87.442

## 2019-10-03 NOTE — Progress Notes (Signed)
[facility] Note  10/10/2019     CHIEF COMPLAINT Patient presents for Retina Follow Up   HISTORY OF PRESENT ILLNESS: Justin [name] is a 86 y.o. male who presents to the clinic today for:   HPI    Retina Follow Up    Patient presents with  Diabetic Retinopathy.  In both eyes.  This started 4 weeks ago.  Severity is moderate.  I, the attending physician,  performed the HPI with the patient and updated documentation appropriately.          Comments    Patient here for 4 weeks retina follow up for NPDR OU. Patient states vision doing pretty good. No eye pain. Eyes run and itch- allergies.       Last edited by [name], MD on 10/10/2019  2:52 PM. (History)    Pt feels like his vision is stable from last exam   Referring physician: [name], MD [address] [place],  [place] [address]  HISTORICAL INFORMATION:   Selected notes from the [facility] Referred by Dr. [name] for concern of mac edema OU LEE:  Ocular Hx-pseudo OU; formerly followed at [facility] Dr. [name]. S/p focal laser OS x2, history of IVK and IVT OU PMH-anxiety, arthritis, CAD, DM (taking lantus and januvia), HTN, high cholesterol, prostate cancer    CURRENT MEDICATIONS: No current outpatient medications on file. (Ophthalmic Drugs)   No current facility-administered medications for this visit. (Ophthalmic Drugs)   Current Outpatient Medications (Other)  Medication Sig  . ACCU-CHEK SMARTVIEW test strip   . acetaminophen (TYLENOL) 500 MG tablet Take 500 mg by mouth daily as needed for mild pain.  Marland Kitchen AMITIZA 24 MCG capsule Take 24 mcg by mouth as needed for constipation.   Marland Kitchen aspirin EC 81 MG tablet Take 81 mg by mouth daily.  . chlorthalidone (HYGROTON) 25 MG tablet Take 0.5 tablets (12.5 mg total) by mouth daily.  . cilostazol (PLETAL) 100 MG tablet Take 1 tablet by mouth twice daily  . clonazePAM (KLONOPIN) 0.5  MG tablet Take 0.5 mg by mouth daily as needed for anxiety.  . isosorbide mononitrate (IMDUR) 60 MG 24 hr tablet Take 1 tablet by mouth once daily  . JANUVIA 100 MG tablet Take 100 mg by mouth daily.   Marland Kitchen LANTUS SOLOSTAR 100 UNIT/ML Solostar Pen Inject 15-50 Units into the skin 2 (two) times daily. 50 in the morning 15 in the evening  . metoprolol (LOPRESSOR) 50 MG tablet Take 50 mg by mouth 2 (two) times daily.  . nitroGLYCERIN (NITROSTAT) 0.4 MG SL tablet Place 1 tablet (0.4 mg total) under the tongue every 5 (five) minutes as needed for chest pain.  Marland Kitchen omeprazole (PRILOSEC) 40 MG capsule Take by mouth daily.   . rosuvastatin (CRESTOR) 20 MG tablet Take 1 tablet (20 mg total) by mouth daily.  . tamsulosin (FLOMAX) 0.4 MG CAPS capsule Take 0.4 mg by mouth daily.  . vitamin B-12 (CYANOCOBALAMIN) 1000 MCG tablet Take 1,000 mcg by mouth daily.  . vitamin B-6 (PYRIDOXINE) 25 MG tablet Take 25 mg by mouth daily.  [name] 2.5 MG TABS tablet TAKE 1 TABLET TWICE DAILY   Current Facility-Administered Medications (Other)  Medication Route  . Bevacizumab (AVASTIN) SOLN 1.25 mg Intravitreal  . Bevacizumab (AVASTIN) SOLN 1.25 mg Intravitreal  . Bevacizumab (AVASTIN) SOLN 1.25 mg Intravitreal  . Bevacizumab (AVASTIN) SOLN 1.25 mg Intravitreal      REVIEW  OF SYSTEMS: ROS    Positive for: Gastrointestinal, Genitourinary, Endocrine, Cardiovascular, Eyes   Negative for: Constitutional, Neurological, Skin, Musculoskeletal, HENT, Respiratory, Psychiatric, Allergic/Imm, Heme/Lymph   Last edited by [name], COA on 10/10/2019  1:47 PM. (History)       ALLERGIES Allergies  Allergen Reactions  . Dye Fdc Red [Red Dye] Swelling and Other (See Comments)    CAT scan dye  . Ibuprofen Other (See Comments)    Upset stomach     PAST MEDICAL HISTORY Past Medical History:  Diagnosis Date  . Anxiety   . Arthritis   . Coronary artery disease   . Diabetes mellitus without complication ([place])   .  Diabetic retinopathy ([place])    NPDR OU  . Diverticulitis   . Dyspnea   . GERD (gastroesophageal reflux disease)   . Hypercholesteremia   . Hypertension   . Hypertensive retinopathy    OU  . Pneumonia    as a child  . Prostate cancer ([place])   . UTI (lower urinary tract infection)    Past Surgical History:  Procedure Laterality Date  . ABDOMINAL SURGERY     for diverticulitis; also removed appendix  . CATARACT EXTRACTION    . CATARACT EXTRACTION, BILATERAL    . CHOLECYSTECTOMY N/A 10/12/2017   Procedure: LAPAROSCOPIC CHOLECYSTECTOMY;  Surgeon: [name], MD;  Location: [place];  Service: General;  Laterality: N/A;  . CORONARY ARTERY BYPASS GRAFT  2009  . ERCP N/A 12/21/2018   Procedure: ENDOSCOPIC RETROGRADE CHOLANGIOPANCREATOGRAPHY (ERCP);  Surgeon: [name], MD;  Location: [place];  Service: Endoscopy;  Laterality: N/A;  . ESOPHAGOGASTRODUODENOSCOPY (EGD) WITH PROPOFOL N/A 12/17/2018   Procedure: ESOPHAGOGASTRODUODENOSCOPY (EGD) WITH PROPOFOL;  Surgeon: [name], MD;  Location: [place];  Service: Endoscopy;  Laterality: N/A;  . EUS Left 12/17/2018   Procedure: UPPER ENDOSCOPIC ULTRASOUND (EUS) LINEAR;  Surgeon: [name], MD;  Location: [place];  Service: Endoscopy;  Laterality: Left;  . EYE SURGERY     "for bleeding in eye"  . HERNIA REPAIR    . LEFT HEART CATH AND CORONARY ANGIOGRAPHY N/A 11/04/2016   Procedure: Left Heart Cath and Coronary Angiography;  Surgeon: [name], MD;  Location: [place] [facility];  Service: Cardiovascular;  Laterality: N/A;  . LOWER EXTREMITY ANGIOGRAPHY N/A 11/04/2016   Procedure: Lower Extremity Angiography;  Surgeon: [name], MD;  Location: [place] [facility];  Service: Cardiovascular;  Laterality: N/A;  . PROSTATE SURGERY    . REMOVAL OF STONES  12/21/2018   Procedure: REMOVAL OF STONES;  Surgeon: [name], MD;  Location: [place];  Service: Endoscopy;;  . [name]  12/21/2018   Procedure:  [name];  Surgeon: [name], MD;  Location: [facility] ENDOSCOPY;  Service: Endoscopy;;    FAMILY HISTORY Family History  Problem Relation Age of Onset  . Diabetes Father   . Diabetes Maternal Aunt   . Diabetes Maternal Uncle   . Diabetes Maternal Grandmother   . Heart disease Sister   . Diabetes Brother   . Heart disease Sister     SOCIAL HISTORY Social History   Tobacco Use  . Smoking status: Former Smoker    Years: 2.00  . Smokeless tobacco: Never Used  . Tobacco comment: when he was a teenage age [age]  Substance Use Topics  . Alcohol use: No  . Drug use: No         OPHTHALMIC EXAM:  Base Eye Exam    Visual Acuity (Snellen - Linear)  Right Left   Dist cc 20/25 -2 20/40 -2   Dist ph cc NI 20/30   Correction: Glasses       Tonometry (Tonopen, 1:44 PM)      Right Left   Pressure 12 14       Pupils      Dark Light Shape React APD   Right 3 2 Round Minimal None   Left 3 2 Round Minimal None       Visual Fields (Counting fingers)      Left Right    Full Full       Extraocular Movement      Right Left    Full, Ortho Full, Ortho       Neuro/Psych    Oriented x3: Yes   Mood/Affect: Normal       Dilation    Both eyes: 1.0% Mydriacyl, 2.5% Phenylephrine @ 1:44 PM        Slit Lamp and Fundus Exam    Slit Lamp Exam      Right Left   Lids/Lashes Dermatochalasis - upper lid, Meibomian gland dysfunction Dermatochalasis - upper lid, Meibomian gland dysfunction   Conjunctiva/Sclera Melanosis Melanosis   Cornea Arcus, 1+ Punctate epithelial erosions Arcus, 1+ Punctate epithelial erosions   Anterior Chamber Deep and quiet Deep and quiet   Iris Mild Temporal Iris atrophy, Round and dilated, No NVI Mild Temporal Iris atrophy, Round and dilated, No NVI   Lens PC IOL in good position with mild aterior capsule fimosis PC IOL in good position with mild aterior capsule fimosis   Vitreous Vitreous syneresis, Posterior vitreous detachment, vitreous  condensations Vitreous syneresis, Posterior vitreous detachment, vitreous condensations       Fundus Exam      Right Left   Disc Pink and Sharp, Peripapillary atrophy pink and shap, Tilted disc, mild temporal Temporal Peripapillary atrophy   C/D Ratio 0.2 0.3   Macula Blunted foveal reflex, Retinal pigment epithelial mottling and clumping, Microaneurysms -- improved, persistent central cystic changes  Blunted foveal reflex, focal area of RPE atrophy SN to fovea, Microaneurysms, Retinal pigment epithelial mottling, refractile Epiretinal membrane, persistent central cystic changes    Vessels Vascular attenuation, Tortuous Tortuous, Vascular attenuation   Periphery Attached, scattered MA, good peripheral PRP 360 - room for posterior fill in if needed Attached, scattered IRH.  Peripheral PRP 360.        Refraction    Wearing Rx      Sphere Cylinder Axis Add   Right +0.25 +0.50 010 +3.00   Left [place] +0.75 173 +3.00   Type: Bifocal          IMAGING AND PROCEDURES  Imaging and Procedures for _0 @  OCT, Retina - OU - Both Eyes       Right Eye Quality was good. Central Foveal Thickness: 385. Progression has been stable. Findings include abnormal foveal contour, intraretinal fluid, no SRF, retinal drusen , outer retinal atrophy (Persistent IRF).   Left Eye Quality was good. Central Foveal Thickness: 280. Progression has been stable. Findings include abnormal foveal contour, no SRF, intraretinal fluid, outer retinal atrophy, retinal drusen  (Persistent IRF - minimal change from prior).   Notes *Images captured and stored on drive  Diagnosis / Impression:  DME OU, OD>OS Persistent IRF OU - minimal change from prior  Clinical management:  See below  Abbreviations: NFP - Normal foveal profile. CME - cystoid macular edema. PED - pigment epithelial detachment. IRF - intraretinal fluid. SRF - subretinal fluid.  EZ - ellipsoid zone. ERM - epiretinal membrane. ORA - outer retinal  atrophy. ORT - outer retinal tubulation. SRHM - subretinal hyper-reflective material         Intravitreal Injection, Pharmacologic Agent - OD - Right Eye       Time Out 10/10/2019. 2:11 PM. Confirmed correct patient, procedure, site, and patient consented.   Anesthesia Topical anesthesia was used. Anesthetic medications included Lidocaine 2%, Proparacaine 0.5%.   Procedure Preparation included 5% betadine to ocular surface, eyelid speculum. A 30 gauge needle was used.   Injection:  1.25 mg Bevacizumab (AVASTIN) SOLN   NDC: 57322-025-42, Lot: [id number] , Expiration date: 11/29/2019   Route: Intravitreal, Site: Right Eye, Waste: 0 mL  Post-op Post injection exam found visual acuity of at least counting fingers. The patient tolerated the procedure well. There were no complications. The patient received written and verbal post procedure care education.        Intravitreal Injection, Pharmacologic Agent - OS - Left Eye       Time Out 10/10/2019. 2:12 PM. Confirmed correct patient, procedure, site, and patient consented.   Anesthesia Topical anesthesia was used. Anesthetic medications included Lidocaine 2%, Proparacaine 0.5%.   Procedure Preparation included 5% betadine to ocular surface, eyelid speculum. A 30 gauge needle was used.   Injection:  1.25 mg Bevacizumab (AVASTIN) SOLN   NDC: 70623-762-83, Lot: [id number] , Expiration date: 11/29/2019   Route: Intravitreal, Site: Left Eye, Waste: 0 mL  Post-op Post injection exam found visual acuity of at least counting fingers. The patient tolerated the procedure well. There were no complications. The patient received written and verbal post procedure care education.                 ASSESSMENT/PLAN:    ICD-10-CM   1. Moderate nonproliferative diabetic retinopathy of both eyes with macular edema associated with type 2 diabetes mellitus (HCC)  T51.7616 Intravitreal Injection, Pharmacologic Agent - OD - Right Eye     Intravitreal Injection, Pharmacologic Agent - OS - Left Eye    Bevacizumab (AVASTIN) SOLN 1.25 mg    Bevacizumab (AVASTIN) SOLN 1.25 mg  2. Retinal edema  H35.81 OCT, Retina - OU - Both Eyes  3. Essential hypertension  I10   4. Hypertensive retinopathy of both eyes  H35.033   5. Pseudophakia of both eyes  Z96.1     1,2. Moderate non-proliferative diabetic retinopathy with edema OU   - moved to [place] from [place], [place] -- history of prior injections OS with Dr. [name]  - records received and reviewed from [facility] [place] (Dr. [name])  - history of focal laser OS x2 and IVK/IVT OU in [place] -- last injection was IVK OS November 2013  - initial exam: scattered IRH, DBH and +macular edema  - initial OCT: diabetic macular edema, both eyes, (OD > OS)  - FA (10.1.19) showed late leaking MA OU -- no NV  - delayed f/u on 4.16.20 due to hospitalization for sepsis / gallstones  - S/P PRP OD (07.14.20) - good laser surrounding, room for posterior fill in if needed  - S/P PRP OS (08.26.20)  - S/P IVA OD #1 (09.17.19), #2 (10.29.19), #3 (11.26.19), #4 (01.02.20), #5 (01.30.20), #6 (02.28.20), #7 (04.16.20), #8 (06.11.20), #9 (08.12.20)  - S/P IVA OS #1 (10.01.19), #2 (10.29.19), #3 (11.26.19), #4 (01.02.20), #5 (01.30.20), #6 (02.28.20), #7 (04.16.20), #8 (05.14.20), #9 (06.11.20), #10 (07.09.20), #11 (08.12.20)  - S/P IVK OD #1 (05.14.20) -- no significant improvement  - switched back to  Avastin OD (#9 6.11.2020)  - repeated Eylea4U benefits investigation due to possible IVA resistance (8.12.20)  - S/P IVE OS #1 (09.16.20), #2 (10.16.20), #3 (11.13.20), #4 (12.11.20)  - S/P IVE OD #1 (09.16.20), #2 (10.16.20), #3 (11.13.20), #4 (12.11.20)    - OCT today shows persistent IRF OU  - BCVA: OD stable at 20/25 and OS improved to 20/30 to 20/40  - recommend IVA OU today (OD #10 and OS #12), 01.11.21 - Eylea4U benefits investigation pending approval for 2021  - pt wishes to proceed  - RBA of  procedure discussed, questions answered  - informed consent obtained and signed  - see procedure note  - f/u 4 weeks, DFE, OCT, possible injection  3,4. Hypertensive retinopathy OU  - discussed importance of tight BP control  - monitor  5. Pseudophakia OU  - s/p CE/IOL OU in [place], [place]  - beautiful surgeries, doing well  - monitor   Ophthalmic Meds Ordered this visit:  Meds ordered this encounter  Medications  . Bevacizumab (AVASTIN) SOLN 1.25 mg  . Bevacizumab (AVASTIN) SOLN 1.25 mg       Return in about 4 weeks (around 11/07/2019) for f/u NPDR OU, DFE, OCT.  There are no Patient Instructions on file for this visit.   Explained the diagnoses, plan, and follow up with the patient and they expressed understanding.  Patient expressed understanding of the importance of proper follow up care.   This document serves as a record of services personally performed by Gardiner Sleeper, MD, PhD. It was created on their behalf by Leeann Must, [place], a certified ophthalmic assistant. The creation of this record is the provider's dictation and/or activities during the visit.    Electronically signed by: Leeann Must, COA _0 @ 11:02 PM   This document serves as a record of services personally performed by Gardiner Sleeper, MD, PhD. It was created on their behalf by Ernest Mallick, OA, an ophthalmic assistant. The creation of this record is the provider's dictation and/or activities during the visit.    Electronically signed by: Ernest Mallick, OA 01.11.2021 11:02 PM   Gardiner Sleeper, M.D., Ph.D. Diseases & Surgery of the Retina and Vitreous [facility]  I have reviewed the above documentation for accuracy and completeness, and I agree with the above. Gardiner Sleeper, M.D., Ph.D. 10/10/19 11:02 PM   Abbreviations: M myopia (nearsighted); A astigmatism; H hyperopia (farsighted); P presbyopia; Mrx spectacle prescription;  CTL contact lenses; OD right eye; OS left  eye; OU both eyes  XT exotropia; ET esotropia; PEK punctate epithelial keratitis; PEE punctate epithelial erosions; DES dry eye syndrome; MGD meibomian gland dysfunction; ATs artificial tears; PFAT's preservative free artificial tears; [place] nuclear sclerotic cataract; PSC posterior subcapsular cataract; ERM epi-retinal membrane; PVD posterior vitreous detachment; RD retinal detachment; DM diabetes mellitus; DR diabetic retinopathy; NPDR non-proliferative diabetic retinopathy; PDR proliferative diabetic retinopathy; CSME clinically significant macular edema; DME diabetic macular edema; dbh dot blot hemorrhages; CWS cotton wool spot; POAG primary open angle glaucoma; C/D cup-to-disc ratio; HVF humphrey visual field; GVF goldmann visual field; OCT optical coherence tomography; IOP intraocular pressure; BRVO Branch retinal vein occlusion; CRVO central retinal vein occlusion; CRAO central retinal artery occlusion; BRAO branch retinal artery occlusion; RT retinal tear; SB scleral buckle; PPV pars plana vitrectomy; VH Vitreous hemorrhage; PRP panretinal laser photocoagulation; IVK intravitreal kenalog; VMT vitreomacular traction; MH Macular hole;  NVD neovascularization of the disc; NVE neovascularization elsewhere; AREDS age related eye disease study; ARMD age  related macular degeneration; POAG primary open angle glaucoma; EBMD epithelial/anterior basement membrane dystrophy; ACIOL anterior chamber intraocular lens; IOL intraocular lens; PCIOL posterior chamber intraocular lens; Phaco/IOL phacoemulsification with intraocular lens placement; [place] photorefractive keratectomy; LASIK laser assisted in situ keratomileusis; HTN hypertension; DM diabetes mellitus; COPD chronic obstructive pulmonary disease

## 2019-10-07 ENCOUNTER — Ambulatory Visit: Payer: Medicare PPO | Admitting: Hematology

## 2019-10-07 ENCOUNTER — Other Ambulatory Visit: Payer: Medicare PPO

## 2019-10-10 ENCOUNTER — Ambulatory Visit (INDEPENDENT_AMBULATORY_CARE_PROVIDER_SITE_OTHER): Payer: Medicare PPO | Admitting: Ophthalmology

## 2019-10-10 ENCOUNTER — Encounter (INDEPENDENT_AMBULATORY_CARE_PROVIDER_SITE_OTHER): Payer: Self-pay | Admitting: Ophthalmology

## 2019-10-10 DIAGNOSIS — H3581 Retinal edema: Secondary | ICD-10-CM | POA: Diagnosis not present

## 2019-10-10 DIAGNOSIS — I10 Essential (primary) hypertension: Secondary | ICD-10-CM

## 2019-10-10 DIAGNOSIS — Z961 Presence of intraocular lens: Secondary | ICD-10-CM

## 2019-10-10 DIAGNOSIS — H35033 Hypertensive retinopathy, bilateral: Secondary | ICD-10-CM

## 2019-10-10 DIAGNOSIS — E113313 Type 2 diabetes mellitus with moderate nonproliferative diabetic retinopathy with macular edema, bilateral: Secondary | ICD-10-CM

## 2019-10-10 MED ORDER — BEVACIZUMAB CHEMO INJECTION 1.25MG/0.05ML SYRINGE FOR KALEIDOSCOPE
1.2500 mg | INTRAVITREAL | Status: AC | PRN
  Administered 2019-10-10: 1.25 mg via INTRAVITREAL

## 2019-10-11 ENCOUNTER — Other Ambulatory Visit: Payer: Self-pay

## 2019-10-11 ENCOUNTER — Ambulatory Visit (INDEPENDENT_AMBULATORY_CARE_PROVIDER_SITE_OTHER): Payer: Medicare PPO

## 2019-10-11 DIAGNOSIS — I6521 Occlusion and stenosis of right carotid artery: Secondary | ICD-10-CM | POA: Diagnosis not present

## 2019-10-16 ENCOUNTER — Other Ambulatory Visit: Payer: Self-pay | Admitting: Cardiology

## 2019-10-16 DIAGNOSIS — I6523 Occlusion and stenosis of bilateral carotid arteries: Secondary | ICD-10-CM

## 2019-10-18 NOTE — Progress Notes (Signed)
Patient is aware or results.

## 2019-10-25 NOTE — Progress Notes (Signed)
[facility] Note  11/07/2019     CHIEF COMPLAINT Patient presents for Retina Follow Up   HISTORY OF PRESENT ILLNESS: Justin [name] is a 86 y.o. male who presents to the clinic today for:   HPI    Retina Follow Up    Patient presents with  Diabetic Retinopathy.  In both eyes.  This started weeks ago.  Severity is moderate.  Duration of weeks.  Since onset it is stable.  I, the attending physician,  performed the HPI with the patient and updated documentation appropriately.          Comments    BS: 172 this AM Pt states his vision is about the same OU.  Patient denies eye pain or discomfort and denies any new or worsening floaters or fol OU.       Last edited by [name], MD on 11/07/2019  2:09 PM. (History)    Pt feels    Referring physician: [name], MD [address] [place],  [place] [address]  HISTORICAL INFORMATION:   Selected notes from the [facility] Referred by Dr. [name] for concern of mac edema OU LEE:  Ocular Hx-pseudo OU; formerly followed at [facility] Dr. [name]. S/p focal laser OS x2, history of IVK and IVT OU PMH-anxiety, arthritis, CAD, DM (taking lantus and januvia), HTN, high cholesterol, prostate cancer    CURRENT MEDICATIONS: No current outpatient medications on file. (Ophthalmic Drugs)   No current facility-administered medications for this visit. (Ophthalmic Drugs)   Current Outpatient Medications (Other)  Medication Sig  . ACCU-CHEK SMARTVIEW test strip   . acetaminophen (TYLENOL) 500 MG tablet Take 500 mg by mouth daily as needed for mild pain.  Marland Kitchen AMITIZA 24 MCG capsule Take 24 mcg by mouth as needed for constipation.   Marland Kitchen aspirin EC 81 MG tablet Take 81 mg by mouth daily.  . chlorthalidone (HYGROTON) 25 MG tablet Take 0.5 tablets (12.5 mg total) by mouth daily.  . cilostazol (PLETAL) 100 MG tablet Take 1 tablet by mouth twice daily  . clonazePAM  (KLONOPIN) 0.5 MG tablet Take 0.5 mg by mouth daily as needed for anxiety.  . isosorbide mononitrate (IMDUR) 60 MG 24 hr tablet Take 1 tablet by mouth once daily  . JANUVIA 100 MG tablet Take 100 mg by mouth daily.   Marland Kitchen LANTUS SOLOSTAR 100 UNIT/ML Solostar Pen Inject 15-50 Units into the skin 2 (two) times daily. 50 in the morning 15 in the evening  . metoprolol (LOPRESSOR) 50 MG tablet Take 50 mg by mouth 2 (two) times daily.  . nitroGLYCERIN (NITROSTAT) 0.4 MG SL tablet Place 1 tablet (0.4 mg total) under the tongue every 5 (five) minutes as needed for chest pain.  Marland Kitchen omeprazole (PRILOSEC) 40 MG capsule Take by mouth daily.   . rosuvastatin (CRESTOR) 20 MG tablet Take 1 tablet (20 mg total) by mouth daily.  . tamsulosin (FLOMAX) 0.4 MG CAPS capsule Take 0.4 mg by mouth daily.  . vitamin B-12 (CYANOCOBALAMIN) 1000 MCG tablet Take 1,000 mcg by mouth daily.  . vitamin B-6 (PYRIDOXINE) 25 MG tablet Take 25 mg by mouth daily.  [name] 2.5 MG TABS tablet TAKE 1 TABLET TWICE DAILY   Current Facility-Administered Medications (Other)  Medication Route  . Bevacizumab (AVASTIN) SOLN 1.25 mg Intravitreal  . Bevacizumab (AVASTIN) SOLN 1.25 mg Intravitreal  . Bevacizumab (AVASTIN) SOLN 1.25 mg Intravitreal  . Bevacizumab (AVASTIN) SOLN 1.25 mg  Intravitreal      REVIEW OF SYSTEMS: ROS    Positive for: Gastrointestinal, Genitourinary, Endocrine, Cardiovascular, Eyes   Negative for: Constitutional, Neurological, Skin, Musculoskeletal, HENT, Respiratory, Psychiatric, Allergic/Imm, Heme/Lymph   Last edited by Doneen Poisson on 11/07/2019  8:36 AM. (History)       ALLERGIES Allergies  Allergen Reactions  . Dye Fdc Red [Red Dye] Swelling and Other (See Comments)    CAT scan dye  . Ibuprofen Other (See Comments)    Upset stomach     PAST MEDICAL HISTORY Past Medical History:  Diagnosis Date  . Anxiety   . Arthritis   . Coronary artery disease   . Diabetes mellitus without complication  ([place])   . Diabetic retinopathy ([place])    NPDR OU  . Diverticulitis   . Dyspnea   . GERD (gastroesophageal reflux disease)   . Hypercholesteremia   . Hypertension   . Hypertensive retinopathy    OU  . Pneumonia    as a child  . Prostate cancer ([place])   . UTI (lower urinary tract infection)    Past Surgical History:  Procedure Laterality Date  . ABDOMINAL SURGERY     for diverticulitis; also removed appendix  . CATARACT EXTRACTION    . CATARACT EXTRACTION, BILATERAL    . CHOLECYSTECTOMY N/A 10/12/2017   Procedure: LAPAROSCOPIC CHOLECYSTECTOMY;  Surgeon: [name], MD;  Location: [facility];  Service: General;  Laterality: N/A;  . CORONARY ARTERY BYPASS GRAFT  2009  . ERCP N/A 12/21/2018   Procedure: ENDOSCOPIC RETROGRADE CHOLANGIOPANCREATOGRAPHY (ERCP);  Surgeon: [name], MD;  Location: [facility];  Service: Endoscopy;  Laterality: N/A;  . ESOPHAGOGASTRODUODENOSCOPY (EGD) WITH PROPOFOL N/A 12/17/2018   Procedure: ESOPHAGOGASTRODUODENOSCOPY (EGD) WITH PROPOFOL;  Surgeon: [name], MD;  Location: [facility];  Service: Endoscopy;  Laterality: N/A;  . EUS Left 12/17/2018   Procedure: UPPER ENDOSCOPIC ULTRASOUND (EUS) LINEAR;  Surgeon: [name], MD;  Location: [facility];  Service: Endoscopy;  Laterality: Left;  . EYE SURGERY     "for bleeding in eye"  . HERNIA REPAIR    . LEFT HEART CATH AND CORONARY ANGIOGRAPHY N/A 11/04/2016   Procedure: Left Heart Cath and Coronary Angiography;  Surgeon: [name], MD;  Location: [facility] CV LAB;  Service: Cardiovascular;  Laterality: N/A;  . LOWER EXTREMITY ANGIOGRAPHY N/A 11/04/2016   Procedure: Lower Extremity Angiography;  Surgeon: [name], MD;  Location: [facility] CV LAB;  Service: Cardiovascular;  Laterality: N/A;  . PROSTATE SURGERY    . REMOVAL OF STONES  12/21/2018   Procedure: REMOVAL OF STONES;  Surgeon: [name], MD;  Location: [facility];  Service: Endoscopy;;  . [name]  12/21/2018   Procedure:  [name];  Surgeon: [name], MD;  Location: [facility] ENDOSCOPY;  Service: Endoscopy;;    FAMILY HISTORY Family History  Problem Relation Age of Onset  . Diabetes Father   . Diabetes Maternal Aunt   . Diabetes Maternal Uncle   . Diabetes Maternal Grandmother   . Heart disease Sister   . Diabetes Brother   . Heart disease Sister     SOCIAL HISTORY Social History   Tobacco Use  . Smoking status: Former Smoker    Years: 2.00  . Smokeless tobacco: Never Used  . Tobacco comment: when he was a teenage age [age]  Substance Use Topics  . Alcohol use: No  . Drug use: No         OPHTHALMIC EXAM:  Base Eye Exam  Visual Acuity (Snellen - Linear)      Right Left   Dist cc 20/30 20/40 -1   Dist ph cc 20/30 +1 20/30 -1       Tonometry (Tonopen, 8:40 AM)      Right Left   Pressure 12 12       Pupils      Dark Light Shape React APD   Right 3 2 Round Brisk 0   Left 3 2 Round Brisk 0       Visual Fields      Left Right    Full Full       Extraocular Movement      Right Left    Full Full       Neuro/Psych    Oriented x3: Yes   Mood/Affect: Normal       Dilation    Both eyes: 1.0% Mydriacyl, 2.5% Phenylephrine @ 8:40 AM        Slit Lamp and Fundus Exam    Slit Lamp Exam      Right Left   Lids/Lashes Dermatochalasis - upper lid, Meibomian gland dysfunction Dermatochalasis - upper lid, Meibomian gland dysfunction   Conjunctiva/Sclera Melanosis Melanosis   Cornea Arcus, 1+ Punctate epithelial erosions Arcus, 1+ Punctate epithelial erosions   Anterior Chamber Deep and quiet Deep and quiet   Iris Mild Temporal Iris atrophy, Round and dilated, No NVI Mild Temporal Iris atrophy, Round and dilated, No NVI   Lens PC IOL in good position with mild aterior capsule fimosis PC IOL in good position with mild aterior capsule fimosis   Vitreous Vitreous syneresis, Posterior vitreous detachment, vitreous condensations Vitreous syneresis, Posterior vitreous detachment,  vitreous condensations       Fundus Exam      Right Left   Disc Pink and Sharp, Peripapillary atrophy pink and shap, Tilted disc, mild temporal Temporal Peripapillary atrophy   C/D Ratio 0.2 0.3   Macula Blunted foveal reflex, Retinal pigment epithelial mottling and clumping, Microaneurysms -- improved, persistent central cystic changes  Blunted foveal reflex, focal area of RPE atrophy SN to fovea, Microaneurysms, Retinal pigment epithelial mottling, refractile Epiretinal membrane, persistent central cystic changes    Vessels Vascular attenuation, Tortuous Tortuous, Vascular attenuation   Periphery Attached, scattered MA, good peripheral PRP 360 - room for posterior fill in if needed Attached, scattered IRH.  Peripheral PRP 360.        Refraction    Wearing Rx      Sphere Cylinder Axis Add   Right +0.25 +0.50 010 +3.00   Left [place] +0.75 173 +3.00   Type: Bifocal          IMAGING AND PROCEDURES  Imaging and Procedures for _0 @  OCT, Retina - OU - Both Eyes       Right Eye Quality was good. Central Foveal Thickness: 288. Progression has been stable. Findings include abnormal foveal contour, intraretinal fluid, no SRF, retinal drusen , outer retinal atrophy (Persistent cystic changes, no improvement).   Left Eye Quality was good. Central Foveal Thickness: 274. Progression has been stable. Findings include abnormal foveal contour, no SRF, intraretinal fluid, outer retinal atrophy, retinal drusen  (Persistent IRF - minimal change from prior).   Notes *Images captured and stored on drive  Diagnosis / Impression:  DME OU, OD>OS Persistent IRF OU - minimal change from prior  Clinical management:  See below  Abbreviations: NFP - Normal foveal profile. CME - cystoid macular edema. PED - pigment epithelial detachment. IRF -  intraretinal fluid. SRF - subretinal fluid. EZ - ellipsoid zone. ERM - epiretinal membrane. ORA - outer retinal atrophy. ORT - outer retinal tubulation.  SRHM - subretinal hyper-reflective material         Intravitreal Injection, Pharmacologic Agent - OD - Right Eye       Time Out 11/07/2019. 8:48 AM. Confirmed correct patient, procedure, site, and patient consented.   Anesthesia Topical anesthesia was used. Anesthetic medications included Lidocaine 2%, Proparacaine 0.5%.   Procedure Preparation included 5% betadine to ocular surface, eyelid speculum. A supplied (32 g) needle was used.   Injection:  2 mg aflibercept Alfonse Flavors) SOLN   NDC: A3590391, Lot: [id number], Expiration date: 04/18/2020   Route: Intravitreal, Site: Right Eye, Waste: 0.05 mL  Post-op Post injection exam found visual acuity of at least counting fingers. The patient tolerated the procedure well. There were no complications. The patient received written and verbal post procedure care education.        Intravitreal Injection, Pharmacologic Agent - OS - Left Eye       Time Out 11/07/2019. 8:49 AM. Confirmed correct patient, procedure, site, and patient consented.   Anesthesia Topical anesthesia was used. Anesthetic medications included Lidocaine 2%, Proparacaine 0.5%.   Procedure Preparation included 5% betadine to ocular surface, eyelid speculum. A supplied (32 g) needle was used.   Injection:  2 mg aflibercept Alfonse Flavors) SOLN   NDC: A3590391, Lot: [id number], Expiration date: 04/24/2020   Route: Intravitreal, Site: Left Eye, Waste: 0.05 mL  Post-op Post injection exam found visual acuity of at least counting fingers. The patient tolerated the procedure well. There were no complications. The patient received written and verbal post procedure care education.                 ASSESSMENT/PLAN:    ICD-10-CM   1. Moderate nonproliferative diabetic retinopathy of both eyes with macular edema associated with type 2 diabetes mellitus (HCC)  X32.4401 Intravitreal Injection, Pharmacologic Agent - OD - Right Eye    Intravitreal Injection, Pharmacologic  Agent - OS - Left Eye    aflibercept (EYLEA) SOLN 2 mg    aflibercept (EYLEA) SOLN 2 mg  2. Retinal edema  H35.81 OCT, Retina - OU - Both Eyes  3. Essential hypertension  I10   4. Hypertensive retinopathy of both eyes  H35.033   5. Pseudophakia of both eyes  Z96.1     1,2. Moderate non-proliferative diabetic retinopathy with edema OU   - moved to [place] from [place], [place] -- history of prior injections OS with Dr. [name]  - records received and reviewed from [facility] (Dr. [name])  - history of focal laser OS x2 and IVK/IVT OU in [place] -- last injection was IVK OS November 2013  - initial exam: scattered IRH, DBH and +macular edema  - initial OCT: diabetic macular edema, both eyes, (OD > OS)  - FA (10.1.19) showed late leaking MA OU -- no NV  - delayed f/u on 4.16.20 due to hospitalization for sepsis / gallstones  - S/P PRP OD (07.14.20) - good laser surrounding, room for posterior fill in if needed  - S/P PRP OS (08.26.20)  - S/P IVA OD #1 (09.17.19), #2 (10.29.19), #3 (11.26.19), #4 (01.02.20), #5 (01.30.20), #6 (02.28.20), #7 (04.16.20), #8 (06.11.20), #9 (08.12.20), #10 (01.11.21)  - S/P IVA OS #1 (10.01.19), #2 (10.29.19), #3 (11.26.19), #4 (01.02.20), #5 (01.30.20), #6 (02.28.20), #7 (04.16.20), #8 (05.14.20), #9 (06.11.20), #10 (07.09.20), #11 (08.12.20), #12 (01.11.21)  - S/P IVK  OD #1 (05.14.20) -- no significant improvement  - switched back to Avastin OD (#9 6.11.2020)  - repeated Eylea4U benefits investigation due to possible IVA resistance (8.12.20)  - S/P IVE OS #1 (09.16.20), #2 (10.16.20), #3 (11.13.20), #4 (12.11.20)  - S/P IVE OD #1 (09.16.20), #2 (10.16.20), #3 (11.13.20), #4 (12.11.20)  - OCT today shows persistent IRF OU  - BCVA: OD slightly decreased to 20/30 from 20/25 and OS stable at 20/30   - recommend IVE OU today (OD #5 and OS #5), 02.08.21 - Eylea4U benefits investigation completed and verified for 2021 as of 2.8.21  - pt wishes to  proceed  - RBA of procedure discussed, questions answered  - informed consent obtained  - see procedure note  - Eylea informed consent form signed and scanned on 12.11.2020  - f/u 4 weeks, DFE, OCT, possible injection  3,4. Hypertensive retinopathy OU  - discussed importance of tight BP control  - monitor  5. Pseudophakia OU  - s/p CE/IOL OU in [place], [place]  - beautiful surgeries, doing well  - monitor   Ophthalmic Meds Ordered this visit:  Meds ordered this encounter  Medications  . aflibercept (EYLEA) SOLN 2 mg  . aflibercept (EYLEA) SOLN 2 mg       Return in about 4 weeks (around 12/05/2019) for f/u NPDR OU, DFE, OCT.  There are no Patient Instructions on file for this visit.   Explained the diagnoses, plan, and follow up with the patient and they expressed understanding.  Patient expressed understanding of the importance of proper follow up care.   This document serves as a record of services personally performed by Gardiner Sleeper, MD, PhD. It was created on their behalf by Leeann Must, [place], a certified ophthalmic assistant. The creation of this record is the provider's dictation and/or activities during the visit.    Electronically signed by: Leeann Must, COA _0 @ 2:14 PM  Gardiner Sleeper, M.D., Ph.D. Diseases & Surgery of the Retina and Vitreous [facility]  I have reviewed the above documentation for accuracy and completeness, and I agree with the above. Gardiner Sleeper, M.D., Ph.D. 11/07/19 2:14 PM   Abbreviations: M myopia (nearsighted); A astigmatism; H hyperopia (farsighted); P presbyopia; Mrx spectacle prescription;  CTL contact lenses; OD right eye; OS left eye; OU both eyes  XT exotropia; ET esotropia; PEK punctate epithelial keratitis; PEE punctate epithelial erosions; DES dry eye syndrome; MGD meibomian gland dysfunction; ATs artificial tears; PFAT's preservative free artificial tears; [place] nuclear sclerotic cataract; PSC  posterior subcapsular cataract; ERM epi-retinal membrane; PVD posterior vitreous detachment; RD retinal detachment; DM diabetes mellitus; DR diabetic retinopathy; NPDR non-proliferative diabetic retinopathy; PDR proliferative diabetic retinopathy; CSME clinically significant macular edema; DME diabetic macular edema; dbh dot blot hemorrhages; CWS cotton wool spot; POAG primary open angle glaucoma; C/D cup-to-disc ratio; HVF humphrey visual field; GVF goldmann visual field; OCT optical coherence tomography; IOP intraocular pressure; BRVO Branch retinal vein occlusion; CRVO central retinal vein occlusion; CRAO central retinal artery occlusion; BRAO branch retinal artery occlusion; RT retinal tear; SB scleral buckle; PPV pars plana vitrectomy; VH Vitreous hemorrhage; PRP panretinal laser photocoagulation; IVK intravitreal kenalog; VMT vitreomacular traction; MH Macular hole;  NVD neovascularization of the disc; NVE neovascularization elsewhere; AREDS age related eye disease study; ARMD age related macular degeneration; POAG primary open angle glaucoma; EBMD epithelial/anterior basement membrane dystrophy; ACIOL anterior chamber intraocular lens; IOL intraocular lens; PCIOL posterior chamber intraocular lens; Phaco/IOL phacoemulsification with intraocular lens placement; [place]  photorefractive keratectomy; LASIK laser assisted in situ keratomileusis; HTN hypertension; DM diabetes mellitus; COPD chronic obstructive pulmonary disease

## 2019-11-07 ENCOUNTER — Encounter (INDEPENDENT_AMBULATORY_CARE_PROVIDER_SITE_OTHER): Payer: Self-pay | Admitting: Ophthalmology

## 2019-11-07 ENCOUNTER — Other Ambulatory Visit: Payer: Self-pay

## 2019-11-07 ENCOUNTER — Ambulatory Visit (INDEPENDENT_AMBULATORY_CARE_PROVIDER_SITE_OTHER): Payer: Medicare PPO | Admitting: Ophthalmology

## 2019-11-07 DIAGNOSIS — H35033 Hypertensive retinopathy, bilateral: Secondary | ICD-10-CM | POA: Diagnosis not present

## 2019-11-07 DIAGNOSIS — H3581 Retinal edema: Secondary | ICD-10-CM

## 2019-11-07 DIAGNOSIS — Z961 Presence of intraocular lens: Secondary | ICD-10-CM | POA: Diagnosis not present

## 2019-11-07 DIAGNOSIS — E113313 Type 2 diabetes mellitus with moderate nonproliferative diabetic retinopathy with macular edema, bilateral: Secondary | ICD-10-CM | POA: Diagnosis not present

## 2019-11-07 DIAGNOSIS — I10 Essential (primary) hypertension: Secondary | ICD-10-CM | POA: Diagnosis not present

## 2019-11-07 MED ORDER — AFLIBERCEPT 2MG/0.05ML IZ SOLN FOR KALEIDOSCOPE
2.0000 mg | INTRAVITREAL | Status: AC | PRN
  Administered 2019-11-07: 14:00:00 2 mg via INTRAVITREAL

## 2019-11-14 ENCOUNTER — Encounter: Payer: Self-pay | Admitting: Cardiology

## 2019-11-14 ENCOUNTER — Ambulatory Visit (INDEPENDENT_AMBULATORY_CARE_PROVIDER_SITE_OTHER): Payer: Medicare HMO | Admitting: Cardiology

## 2019-11-14 ENCOUNTER — Other Ambulatory Visit: Payer: Self-pay

## 2019-11-14 VITALS — BP 129/67 | HR 70 | Temp 94.5°F | Ht 67.5 in | Wt 186.0 lb

## 2019-11-14 DIAGNOSIS — I6523 Occlusion and stenosis of bilateral carotid arteries: Secondary | ICD-10-CM | POA: Diagnosis not present

## 2019-11-14 DIAGNOSIS — I10 Essential (primary) hypertension: Secondary | ICD-10-CM | POA: Diagnosis not present

## 2019-11-14 DIAGNOSIS — I739 Peripheral vascular disease, unspecified: Secondary | ICD-10-CM

## 2019-11-14 DIAGNOSIS — I2510 Atherosclerotic heart disease of native coronary artery without angina pectoris: Secondary | ICD-10-CM | POA: Diagnosis not present

## 2019-11-14 NOTE — Progress Notes (Signed)
Primary Physician:  [name], MD   Patient ID: Justin [name], male    DOB: 1933-05-30, 86 y.o.   MRN: [id number]  Subjective:    Chief Complaint  Patient presents with  . PAD    pt c/o big toe pain  . Follow-up     HPI: Justin [name]  is a 86 y.o. male  with history of CAD, S/P CABG in 2009, PAD, type II diabetes, asymptomatic right carotid stenosis, and hyperlipidemia. Underwent coronary angiogram on 11/04/2016 at the same time also underwent peripheral arteriogram due to severe symptoms of claudication involving bilateral calf, showed mild disease in SFA and severe small vessel disease left worse in the right with slow flow in left and 2 vessel runoff. Patent coronary grafts except occluded svg to rca which is small and diffusely diseasd. ABI performed on 11/18/2017 revealed a right TBI of 0.6 and left TBI of 0.54. There was no significant change in lower extremity duplex compared to 2017.  Patient is here for 6 month follow up. He continues to report toenail discoloration particularly on the left foot. States that it is very painful. Also states that he is having worsening pain in his feet and legs, worse on the left, with walking. No pain at rest. No ulcerations. He is now on low dose Xarelto and ASA.   No chest pain. Dyspnea on exertion is stable. Has not had any further abdominal pain or N/V.    Past Medical History:  Diagnosis Date  . Anxiety   . Arthritis   . Coronary artery disease   . Diabetes mellitus without complication ([place])   . Diabetic retinopathy ([place])    NPDR OU  . Diverticulitis   . Dyspnea   . GERD (gastroesophageal reflux disease)   . Hypercholesteremia   . Hypertension   . Hypertensive retinopathy    OU  . Pneumonia    as a child  . Prostate cancer ([place])   . UTI (lower urinary tract infection)     Past Surgical History:  Procedure Laterality Date  . ABDOMINAL SURGERY     for diverticulitis; also removed appendix  . CATARACT EXTRACTION     . CATARACT EXTRACTION, BILATERAL    . CHOLECYSTECTOMY N/A 10/12/2017   Procedure: LAPAROSCOPIC CHOLECYSTECTOMY;  Surgeon: [name], MD;  Location: [facility];  Service: General;  Laterality: N/A;  . CORONARY ARTERY BYPASS GRAFT  2009  . ERCP N/A 12/21/2018   Procedure: ENDOSCOPIC RETROGRADE CHOLANGIOPANCREATOGRAPHY (ERCP);  Surgeon: [name], MD;  Location: [facility];  Service: Endoscopy;  Laterality: N/A;  . ESOPHAGOGASTRODUODENOSCOPY (EGD) WITH PROPOFOL N/A 12/17/2018   Procedure: ESOPHAGOGASTRODUODENOSCOPY (EGD) WITH PROPOFOL;  Surgeon: [name], MD;  Location: [facility];  Service: Endoscopy;  Laterality: N/A;  . EUS Left 12/17/2018   Procedure: UPPER ENDOSCOPIC ULTRASOUND (EUS) LINEAR;  Surgeon: [name], MD;  Location: [facility];  Service: Endoscopy;  Laterality: Left;  . EYE SURGERY     "for bleeding in eye"  . HERNIA REPAIR    . LEFT HEART CATH AND CORONARY ANGIOGRAPHY N/A 11/04/2016   Procedure: Left Heart Cath and Coronary Angiography;  Surgeon: [name], MD;  Location: [facility];  Service: Cardiovascular;  Laterality: N/A;  . LOWER EXTREMITY ANGIOGRAPHY N/A 11/04/2016   Procedure: Lower Extremity Angiography;  Surgeon: [name], MD;  Location: [facility];  Service: Cardiovascular;  Laterality: N/A;  . PROSTATE SURGERY    . REMOVAL OF STONES  12/21/2018   Procedure: REMOVAL OF  STONES;  Surgeon: [name], MD;  Location: [facility] END[facility];  Service: Endoscopy;;  . [name]  12/21/2018   Procedure: [name];  Surgeon: [name], MD;  Location: [facility] END[facility]COPY;  Service: Endoscopy;;    Social History   Socioeconomic History  . Marital status: Widowed    Spouse name: Not on file  . Number of children: 4  . Years of education: Not on file  . Highest education level: Not on file  Occupational History  . Not on file  Tobacco Use  . Smoking status: Former Smoker    Years: 2.00  . Smokeless tobacco: Never Used  . Tobacco  comment: when he was a teenage age [age]  Substance and Sexual Activity  . Alcohol use: No  . Drug use: No  . Sexual activity: Not on file  Other Topics Concern  . Not on file  Social History Narrative  . Not on file   Social Determinants of Health   Financial Resource Strain:   . Difficulty of Paying Living Expenses: Not on file  Food Insecurity:   . Worried About Charity fundraiser in the Last Year: Not on file  . Ran Out of Food in the Last Year: Not on file  Transportation Needs:   . Lack of Transportation (Medical): Not on file  . Lack of Transportation (Non-Medical): Not on file  Physical Activity:   . Days of Exercise per Week: Not on file  . Minutes of Exercise per Session: Not on file  Stress:   . Feeling of Stress : Not on file  Social Connections:   . Frequency of Communication with Friends and Family: Not on file  . Frequency of Social Gatherings with Friends and Family: Not on file  . Attends Religious Services: Not on file  . Active Member of Clubs or Organizations: Not on file  . Attends Archivist Meetings: Not on file  . Marital Status: Not on file  Intimate Partner Violence:   . Fear of Current or Ex-Partner: Not on file  . Emotionally Abused: Not on file  . Physically Abused: Not on file  . Sexually Abused: Not on file    Review of Systems  Constitution: Negative for decreased appetite, malaise/fatigue, weight gain and weight loss.  Eyes: Negative for visual disturbance.  Cardiovascular: Positive for claudication, dyspnea on exertion and leg swelling. Negative for chest pain, orthopnea, palpitations and syncope.  Respiratory: Negative for hemoptysis and wheezing.   Endocrine: Negative for cold intolerance and heat intolerance.  Hematologic/Lymphatic: Does not bruise/bleed easily.  Skin: Positive for color change. Negative for nail changes.  Musculoskeletal: Positive for back pain and muscle weakness. Negative for myalgias.    Gastrointestinal: Negative for abdominal pain, change in bowel habit, constipation, diarrhea, nausea and vomiting.  Neurological: Positive for dizziness. Negative for difficulty with concentration, focal weakness and headaches.  Psychiatric/Behavioral: Negative for altered mental status and suicidal ideas.  All other systems reviewed and are negative.     Objective:  Blood pressure 129/67, pulse 70, temperature (!) 94.5 F (34.7 C), height 5' 7.5" (1.715 m), weight 186 lb (84.4 kg), SpO2 100 %. Body mass index is 28.7 kg/m.      Physical Exam  Constitutional: He is oriented to person, place, and time. Vital signs are normal. He appears well-developed and well-nourished.  HENT:  Head: Normocephalic and atraumatic.  Cardiovascular: Normal rate, regular rhythm, normal heart sounds and intact distal pulses.  Pulses:      Carotid pulses are  on the right side with bruit and on the left side with bruit.      Femoral pulses are 2+ on the right side and 2+ on the left side.      Popliteal pulses are 1+ on the right side and 1+ on the left side.       Dorsalis pedis pulses are 1+ on the right side and 0 on the left side.       Posterior tibial pulses are 0 on the right side and 0 on the left side.  Split S2  Pulmonary/Chest: Effort normal and breath sounds normal. No accessory muscle usage. No respiratory distress.  Abdominal: Soft. Bowel sounds are normal.  Musculoskeletal:        General: Normal range of motion.     Cervical back: Normal range of motion.  Neurological: He is alert and oriented to person, place, and time.  Skin: Skin is warm and dry.  Vitals reviewed.  Radiology: No results found.  Laboratory examination:    CMP Latest Ref Rng & Units 03/21/2019 12/23/2018 12/22/2018  Glucose 65 - 99 mg/dL 183(H) 114(H) 199(H)  BUN 8 - 27 mg/dL 30(H) 14 14  Creatinine 0.76 - 1.27 mg/dL 1.74(H) 1.30(H) 1.47(H)  Sodium 134 - 144 mmol/L 141 139 135  Potassium 3.5 - 5.2 mmol/L 4.1 3.5  4.1  Chloride 96 - 106 mmol/L 107(H) 113(H) 112(H)  CO2 20 - 29 mmol/L 21 22 20(L)  Calcium 8.6 - 10.2 mg/dL 9.8 8.1(L) 7.6(L)  Total Protein 6.0 - 8.5 g/dL 7.1 5.2(L) 5.1(L)  Total Bilirubin 0.0 - 1.2 mg/dL 0.3 1.2 1.8(H)  Alkaline Phos 39 - 117 IU/L 83 133(H) 152(H)  AST 0 - 40 IU/L 16 39 40  ALT 0 - 44 IU/L 16 42 40   CBC Latest Ref Rng & Units 12/23/2018 12/22/2018 12/21/2018  WBC 4.0 - 10.5 K/uL 8.5 7.8 8.7  Hemoglobin 13.0 - 17.0 g/dL 9.2(L) 9.1(L) 8.9(L)  Hematocrit 39.0 - 52.0 % 28.1(L) 28.8(L) 27.7(L)  Platelets 150 - 400 K/uL 162 149(L) 141(L)   Lipid Panel  No results found for: CHOL, TRIG, HDL, CHOLHDL, VLDL, LDLCALC, LDLDIRECT HEMOGLOBIN A1C Lab Results  Component Value Date   HGBA1C 8.0 (H) 10/08/2017   MPG 182.9 10/08/2017   TSH No results for input(s): TSH in the last 8760 hours.  PRN Meds:. Medications Discontinued During This Encounter  Medication Reason  . cilostazol (PLETAL) 100 MG tablet Discontinued by provider   Current Meds  Medication Sig  . acetaminophen (TYLENOL) 500 MG tablet Take 500 mg by mouth daily as needed for mild pain.  Marland Kitchen AMITIZA 24 MCG capsule Take 24 mcg by mouth as needed for constipation.   Marland Kitchen aspirin EC 81 MG tablet Take 81 mg by mouth daily.  . chlorthalidone (HYGROTON) 25 MG tablet Take 0.5 tablets (12.5 mg total) by mouth daily.  . clonazePAM (KLONOPIN) 0.5 MG tablet Take 0.5 mg by mouth daily as needed for anxiety.  . isosorbide mononitrate (IMDUR) 60 MG 24 hr tablet Take 1 tablet by mouth once daily  . JANUVIA 100 MG tablet Take 100 mg by mouth daily.   Marland Kitchen LANTUS SOLOSTAR 100 UNIT/ML Solostar Pen Inject 15-50 Units into the skin 2 (two) times daily. 50 in the morning 15 in the evening  . metoprolol (LOPRESSOR) 50 MG tablet Take 50 mg by mouth 2 (two) times daily.  . nitroGLYCERIN (NITROSTAT) 0.4 MG SL tablet Place 1 tablet (0.4 mg total) under the tongue every 5 (five)  minutes as needed for chest pain.  Marland Kitchen omeprazole (PRILOSEC) 40  MG capsule Take by mouth daily.   . rosuvastatin (CRESTOR) 20 MG tablet Take 1 tablet (20 mg total) by mouth daily.  . tamsulosin (FLOMAX) 0.4 MG CAPS capsule Take 0.4 mg by mouth daily.  . vitamin B-12 (CYANOCOBALAMIN) 1000 MCG tablet Take 1,000 mcg by mouth daily.  . vitamin B-6 (PYRIDOXINE) 25 MG tablet Take 25 mg by mouth daily.  [name] 2.5 MG TABS tablet TAKE 1 TABLET TWICE DAILY  . [DISCONTINUED] cilostazol (PLETAL) 100 MG tablet Take 1 tablet by mouth twice daily   Current Facility-Administered Medications for the 11/14/19 encounter (Office Visit) with Miquel Dunn, NP  Medication  . Bevacizumab (AVASTIN) SOLN 1.25 mg  . Bevacizumab (AVASTIN) SOLN 1.25 mg  . Bevacizumab (AVASTIN) SOLN 1.25 mg  . Bevacizumab (AVASTIN) SOLN 1.25 mg    Cardiac Studies:   Carotid artery duplex 2019/10/24:  Stenosis in the right internal carotid artery (50-69%). The right PSV  internal/common carotid artery ratio of 4.88 is consistent with a stenosis  of >70%. Stenosis in the right external carotid artery (<50%).  Severe stenosis in the left internal carotid artery (50-69%).  Stenosis in the left external carotid artery (<50%).  Antegrade right vertebral artery flow. Antegrade left vertebral artery  flow.  No significant change since 05/18/2019. Follow up in six months is  appropriate if clinically indicated.  ABI 05/02/2019: No hemodynamically significant stenoses are identified in the bilateral lower extremity arterial system.   Non-compressible ABI bilateral, suggestive of medial calcinosis. Moderately abnormal waveform right and mildly abnormal waveform left lower extremity at the level of the ankle. Study suggests small vessel disease. Lower extremity arterial duplex 11/18/2017: No hemodynamically significant stenoses are identified in the lower extremity arterial system. Abnormal right TBI (0.60). Abnormal left TBI(0.54).  Both TBIs are above the threshold for adequate healing.    ABIs are considered non-diagnostic due to non-compressible vessels in a long-term diabetic male with presumed medial wall calcification.  No significant change from LE arterial duplex 04/30/2016.  Peripheral arteriogram 11/04/2016: Normal abdominal aorta and aorto-iliac arteries. Minimal plaque bilateral SFA. Severe small vessel disease especially left leg at the ankle Medical therapy.  Echo- 12/24/2017 1. Left ventricle cavity is normal in size. Moderate concentric hypertrophy of the left ventricle. Normal global wall motion. Doppler evidence of grade I (impaired) diastolic dysfunction. Calculated EF 54%. 2. Left atrial cavity is mildly dilated. 3. Trileaflet aortic valve. Mild aortic valve leaflet calcification. 4. Mild to moderate mitral regurgitation. Mild calcification of the mitral valve annulus. Mild mitral valve leaflet calcification. 5. Trace tricuspid regurgitation  Coronary angiogram 11/04/2016: Native RCA small and diffusely diseased with distal 70-80% stenosis. Occluded SVG to RCA. Patent SVG to OM-RI, LIMA to LAD. Medical therapy.  Lexiscan myoview stress test 04/17/2016: 1. Resting EKG demonstrates normal sinus rhythm, left axis deviation, right bundle branch block. Stress EKG is nondiagnostic for ischemia as a pharmacologic stress test. There are frequent PACs during the stress test. Stress symptoms included dyspnea. 2. The Perfusion images reveal the left ventricle to be mildly dilated at 132 mL both in rest and stress images. There is a moderate-sized inferior and inferoapical scar with very mild peri-infarct ischemia noted especially towards the apex. Left ventricle systolic function was moderate to severely depressed at 36%. 3. This is an intermediate risk study, clinical correlation recommended.  Assessment:     ICD-10-CM   1. Atherosclerosis of native coronary artery of native heart  without angina pectoris  I25.10 EKG 12-Lead  2. Claudication in peripheral  vascular disease (HCC)  I73.9 PCV LOWER ARTERIAL (BILATERAL)  3. Primary hypertension  I10   4. Asymptomatic bilateral carotid artery stenosis  I65.23     EKG 11/14/2019: Normal sinus rhythm at 74 bpm with 1 PAC, left axis deviation, left anterior fasicular block, RBBB. Nonspecific T wave abnormality.  Recommendations:   Patient is here on 62-month office visit and follow-up for PAD.  He is mentioning worsening pain in bilateral feet particularly his left foot with walking.  He also has left toe discoloration.  The toe discoloration appears to be related to fungal infection.  Given his diabetes and PAD history, recommended that he establish with podiatry.  We will schedule for lower extremity arterial duplex for further evaluation.  Found to still be on Cilostazol, which I have discontinued to lower his bleeding risk. Continue with ASA and low dose Xarelto.  I discussed recent carotid duplex results, has bilateral 60 to 70% stenosis that has been stable.  Will need continued 83-month follow-ups.  Blood pressure is well controlled, continue with present medications.  No symptoms of angina or clinical evidence of heart failure.  He is due to his PCP next month and will have labs performed at that time.  We will plan to see him back after lower extremity duplex for follow-up.  Miquel Dunn, MSN, APRN, FNP-C [facility]. [place] Office: [phone] Fax: [phone]

## 2019-11-16 ENCOUNTER — Telehealth: Payer: Self-pay | Admitting: *Deleted

## 2019-11-16 NOTE — Telephone Encounter (Signed)
I faxed the forms back to Claudine.  I informed her that Mr. Mousel is not out patient.  The forms should have been sent to [facility].

## 2019-11-16 NOTE — Telephone Encounter (Signed)
"  We have not received his medical records yet.  Did you receive our request?"  I did not receive the request.  "I will fax it to your attention."  If you have any questions, you can call [facility].

## 2019-11-22 DIAGNOSIS — M21611 Bunion of right foot: Secondary | ICD-10-CM | POA: Diagnosis not present

## 2019-11-22 DIAGNOSIS — B351 Tinea unguium: Secondary | ICD-10-CM | POA: Diagnosis not present

## 2019-11-22 DIAGNOSIS — M21612 Bunion of left foot: Secondary | ICD-10-CM | POA: Diagnosis not present

## 2019-11-22 DIAGNOSIS — M2041 Other hammer toe(s) (acquired), right foot: Secondary | ICD-10-CM | POA: Diagnosis not present

## 2019-11-22 DIAGNOSIS — E1151 Type 2 diabetes mellitus with diabetic peripheral angiopathy without gangrene: Secondary | ICD-10-CM | POA: Diagnosis not present

## 2019-11-30 ENCOUNTER — Other Ambulatory Visit: Payer: Self-pay

## 2019-11-30 ENCOUNTER — Ambulatory Visit: Payer: Medicare HMO

## 2019-11-30 DIAGNOSIS — I739 Peripheral vascular disease, unspecified: Secondary | ICD-10-CM

## 2019-12-06 NOTE — Progress Notes (Signed)
[facility] Note  12/09/2019     CHIEF COMPLAINT Patient presents for Retina Follow Up   HISTORY OF PRESENT ILLNESS: Justin [name] is a 86 y.o. male who presents to the clinic today for:   HPI    Retina Follow Up    Patient presents with  Diabetic Retinopathy.  In both eyes.  This started 4 weeks ago.  Severity is moderate.  I, the attending physician,  performed the HPI with the patient and updated documentation appropriately.          Comments    Patient here for 4 weeks for retina follow up for NPDR OU. Patient states vision about the same. No eye pain.        Last edited by [name], MD on 12/09/2019 11:47 AM. (History)    Pt states he is doing well, no new health concerns, he has had both covid vaccines   Referring physician: [name], MD [id number] [place],  [place] [address]  HISTORICAL INFORMATION:   Selected notes from the [facility] Referred by Dr. [name] for concern of mac edema OU LEE:  Ocular Hx-pseudo OU; formerly followed at [facility] Dr. [name]. S/p focal laser OS x2, history of IVK and IVT OU PMH-anxiety, arthritis, CAD, DM (taking lantus and januvia), HTN, high cholesterol, prostate cancer    CURRENT MEDICATIONS: No current outpatient medications on file. (Ophthalmic Drugs)   No current facility-administered medications for this visit. (Ophthalmic Drugs)   Current Outpatient Medications (Other)  Medication Sig  . ACCU-CHEK SMARTVIEW test strip   . acetaminophen (TYLENOL) 500 MG tablet Take 500 mg by mouth daily as needed for mild pain.  Marland Kitchen AMITIZA 24 MCG capsule Take 24 mcg by mouth as needed for constipation.   Marland Kitchen aspirin EC 81 MG tablet Take 81 mg by mouth daily.  . chlorthalidone (HYGROTON) 25 MG tablet Take 0.5 tablets (12.5 mg total) by mouth daily.  . clonazePAM (KLONOPIN) 0.5 MG tablet Take 0.5 mg by mouth daily as needed for anxiety.  .  isosorbide mononitrate (IMDUR) 60 MG 24 hr tablet Take 1 tablet by mouth once daily  . JANUVIA 100 MG tablet Take 100 mg by mouth daily.   Marland Kitchen LANTUS SOLOSTAR 100 UNIT/ML Solostar Pen Inject 15-50 Units into the skin 2 (two) times daily. 50 in the morning 15 in the evening  . metoprolol (LOPRESSOR) 50 MG tablet Take 50 mg by mouth 2 (two) times daily.  . nitroGLYCERIN (NITROSTAT) 0.4 MG SL tablet Place 1 tablet (0.4 mg total) under the tongue every 5 (five) minutes as needed for chest pain.  Marland Kitchen omeprazole (PRILOSEC) 40 MG capsule Take by mouth daily.   . rosuvastatin (CRESTOR) 20 MG tablet Take 1 tablet (20 mg total) by mouth daily.  . tamsulosin (FLOMAX) 0.4 MG CAPS capsule Take 0.4 mg by mouth daily.  . vitamin B-12 (CYANOCOBALAMIN) 1000 MCG tablet Take 1,000 mcg by mouth daily.  . vitamin B-6 (PYRIDOXINE) 25 MG tablet Take 25 mg by mouth daily.  [name] 2.5 MG TABS tablet TAKE 1 TABLET TWICE DAILY   Current Facility-Administered Medications (Other)  Medication Route  . Bevacizumab (AVASTIN) SOLN 1.25 mg Intravitreal  . Bevacizumab (AVASTIN) SOLN 1.25 mg Intravitreal  . Bevacizumab (AVASTIN) SOLN 1.25 mg Intravitreal  . Bevacizumab (AVASTIN) SOLN 1.25 mg Intravitreal      REVIEW OF SYSTEMS: ROS    Positive for: Gastrointestinal, Genitourinary, Endocrine, Cardiovascular,  Eyes   Negative for: Constitutional, Neurological, Skin, Musculoskeletal, HENT, Respiratory, Psychiatric, Allergic/Imm, Heme/Lymph   Last edited by [name], COA on 12/09/2019  8:03 AM. (History)       ALLERGIES Allergies  Allergen Reactions  . Dye Fdc Red [Red Dye] Swelling    CAT scan dye  . Ibuprofen Other (See Comments)    Upset stomach     PAST MEDICAL HISTORY Past Medical History:  Diagnosis Date  . Anxiety   . Arthritis   . Coronary artery disease   . Diabetes mellitus without complication ([facility])   . Diabetic retinopathy ([facility])    NPDR OU  . Diverticulitis   . Dyspnea   . GERD  (gastroesophageal reflux disease)   . Hypercholesteremia   . Hypertension   . Hypertensive retinopathy    OU  . Pneumonia    as a child  . Prostate cancer ([facility])   . UTI (lower urinary tract infection)    Past Surgical History:  Procedure Laterality Date  . ABDOMINAL SURGERY     for diverticulitis; also removed appendix  . CATARACT EXTRACTION    . CATARACT EXTRACTION, BILATERAL    . CHOLECYSTECTOMY N/A 10/12/2017   Procedure: LAPAROSCOPIC CHOLECYSTECTOMY;  Surgeon: [name], MD;  Location: [facility];  Service: General;  Laterality: N/A;  . CORONARY ARTERY BYPASS GRAFT  2009  . ERCP N/A 12/21/2018   Procedure: ENDOSCOPIC RETROGRADE CHOLANGIOPANCREATOGRAPHY (ERCP);  Surgeon: [name], MD;  Location: [facility];  Service: Endoscopy;  Laterality: N/A;  . ESOPHAGOGASTRODUODENOSCOPY (EGD) WITH PROPOFOL N/A 12/17/2018   Procedure: ESOPHAGOGASTRODUODENOSCOPY (EGD) WITH PROPOFOL;  Surgeon: [name], MD;  Location: [facility];  Service: Endoscopy;  Laterality: N/A;  . EUS Left 12/17/2018   Procedure: UPPER ENDOSCOPIC ULTRASOUND (EUS) LINEAR;  Surgeon: [name], MD;  Location: [facility];  Service: Endoscopy;  Laterality: Left;  . EYE SURGERY     "for bleeding in eye"  . HERNIA REPAIR    . LEFT HEART CATH AND CORONARY ANGIOGRAPHY N/A 11/04/2016   Procedure: Left Heart Cath and Coronary Angiography;  Surgeon: [name], MD;  Location: [facility] CV LAB;  Service: Cardiovascular;  Laterality: N/A;  . LOWER EXTREMITY ANGIOGRAPHY N/A 11/04/2016   Procedure: Lower Extremity Angiography;  Surgeon: [name], MD;  Location: [facility] [place] [facility];  Service: Cardiovascular;  Laterality: N/A;  . PROSTATE SURGERY    . REMOVAL OF STONES  12/21/2018   Procedure: REMOVAL OF STONES;  Surgeon: [name], MD;  Location: [facility];  Service: Endoscopy;;  . [name]  12/21/2018   Procedure: [name];  Surgeon: [name], MD;  Location: [facility] ENDOSCOPY;  Service: Endoscopy;;     FAMILY HISTORY Family History  Problem Relation Age of Onset  . Diabetes Father   . Diabetes Maternal Aunt   . Diabetes Maternal Uncle   . Diabetes Maternal Grandmother   . Heart disease Sister   . Diabetes Brother   . Heart disease Sister     SOCIAL HISTORY Social History   Tobacco Use  . Smoking status: Former Smoker    Years: 2.00  . Smokeless tobacco: Never Used  . Tobacco comment: when he was a teenage age 6-19  Substance Use Topics  . Alcohol use: No  . Drug use: No         OPHTHALMIC EXAM:  Base Eye Exam    Visual Acuity (Snellen - Linear)      Right Left   Dist cc 20/30 20/40 -1   Dist  ph cc NI 20/25 -1   Correction: Glasses       Tonometry (Tonopen, 8:00 AM)      Right Left   Pressure 11 14       Pupils      Dark Light Shape React APD   Right 3 2 Round Brisk None   Left 3 2 Round Brisk None       Visual Fields (Counting fingers)      Left Right    Full Full       Extraocular Movement      Right Left    Full, Ortho Full, Ortho       Neuro/Psych    Oriented x3: Yes   Mood/Affect: Normal       Dilation    Both eyes: 1.0% Mydriacyl, 2.5% Phenylephrine @ 8:00 AM        Slit Lamp and Fundus Exam    Slit Lamp Exam      Right Left   Lids/Lashes Dermatochalasis - upper lid, Meibomian gland dysfunction Dermatochalasis - upper lid, Meibomian gland dysfunction   Conjunctiva/Sclera Melanosis Melanosis   Cornea Arcus, 1+ Punctate epithelial erosions Arcus, 1+ Punctate epithelial erosions   Anterior Chamber Deep and quiet Deep and quiet   Iris Mild Temporal Iris atrophy, Round and dilated, No NVI Mild Temporal Iris atrophy, Round and dilated, No NVI   Lens PC IOL in good position with mild aterior capsule fimosis PC IOL in good position with mild aterior capsule fimosis   Vitreous Vitreous syneresis, Posterior vitreous detachment, vitreous condensations Vitreous syneresis, Posterior vitreous detachment, vitreous condensations        Fundus Exam      Right Left   Disc Pink and Sharp, Peripapillary atrophy, Compact pink and shap, Tilted disc, mild temporal Temporal Peripapillary atrophy, Compact   C/D Ratio 0.2 0.3   Macula Blunted foveal reflex, Retinal pigment epithelial mottling and clumping, Microaneurysms -- improved, persistent central cystic changes  Blunted foveal reflex, focal area of RPE atrophy SN to fovea, Microaneurysms, Retinal pigment epithelial mottling, refractile Epiretinal membrane, persistent central cystic changes    Vessels Vascular attenuation, Tortuous Tortuous, Vascular attenuation   Periphery Attached, scattered MA, good peripheral PRP 360 - room for posterior fill in if needed Attached, scattered IRH, Peripheral PRP 360.        Refraction    Wearing Rx      Sphere Cylinder Axis Add   Right +0.25 +0.50 010 +3.00   Left [place] +0.75 173 +3.00   Type: Bifocal          IMAGING AND PROCEDURES  Imaging and Procedures for _0 @  OCT, Retina - OU - Both Eyes       Right Eye Quality was good. Central Foveal Thickness: 388. Progression has been stable. Findings include abnormal foveal contour, intraretinal fluid, no SRF, retinal drusen , outer retinal atrophy (Persistent cystic changes, no improvement).   Left Eye Quality was good. Central Foveal Thickness: 273. Progression has been stable. Findings include abnormal foveal contour, no SRF, intraretinal fluid, outer retinal atrophy, retinal drusen  (Mild interval improvement in IRF ).   Notes *Images captured and stored on drive  Diagnosis / Impression:  DME OU, OD>OS OD: Persistent IRF  OS: Mild interval improvement in IRF   Clinical management:  See below  Abbreviations: NFP - Normal foveal profile. CME - cystoid macular edema. PED - pigment epithelial detachment. IRF - intraretinal fluid. SRF - subretinal fluid. EZ - ellipsoid zone. ERM -  epiretinal membrane. ORA - outer retinal atrophy. ORT - outer retinal tubulation. SRHM -  subretinal hyper-reflective material         Intravitreal Injection, Pharmacologic Agent - OD - Right Eye       Time Out 12/09/2019. 8:13 AM. Confirmed correct patient, procedure, site, and patient consented.   Anesthesia Topical anesthesia was used. Anesthetic medications included Lidocaine 2%, Proparacaine 0.5%.   Procedure Preparation included 5% betadine to ocular surface, eyelid speculum. A (32 g) needle was used.   Injection:  2 mg aflibercept Alfonse Flavors) SOLN   NDC: A3590391, Lot: [id number], Expiration date: 05/25/2020   Route: Intravitreal, Site: Right Eye, Waste: 0.05 mL  Post-op Post injection exam found visual acuity of at least counting fingers. The patient tolerated the procedure well. There were no complications. The patient received written and verbal post procedure care education.        Intravitreal Injection, Pharmacologic Agent - OS - Left Eye       Time Out 12/09/2019. 8:14 AM. Confirmed correct patient, procedure, site, and patient consented.   Anesthesia Topical anesthesia was used. Anesthetic medications included Lidocaine 2%, Proparacaine 0.5%.   Procedure Preparation included 5% betadine to ocular surface, eyelid speculum. A (32 g) needle was used.   Injection:  2 mg aflibercept Alfonse Flavors) SOLN   NDC: A3590391, Lot: [id number], Expiration date: 05/25/2020   Route: Intravitreal, Site: Left Eye, Waste: 0.05 mL  Post-op Post injection exam found visual acuity of at least counting fingers. The patient tolerated the procedure well. There were no complications. The patient received written and verbal post procedure care education.                 ASSESSMENT/PLAN:    ICD-10-CM   1. Moderate nonproliferative diabetic retinopathy of both eyes with macular edema associated with type 2 diabetes mellitus (HCC)  J03.0092 Intravitreal Injection, Pharmacologic Agent - OD - Right Eye    Intravitreal Injection, Pharmacologic Agent - OS - Left Eye     aflibercept (EYLEA) SOLN 2 mg    aflibercept (EYLEA) SOLN 2 mg  2. Retinal edema  H35.81 OCT, Retina - OU - Both Eyes  3. Essential hypertension  I10   4. Hypertensive retinopathy of both eyes  H35.033   5. Pseudophakia of both eyes  Z96.1     1,2. Moderate non-proliferative diabetic retinopathy with edema OU   - moved to [place] from [place], [place] -- history of prior injections OS with Dr. [name]  - records received and reviewed from [facility] (Dr. [name])  - history of focal laser OS x2 and IVK/IVT OU in [place] -- last injection was IVK OS November 2013  - initial exam: scattered IRH, DBH and +macular edema  - initial OCT: diabetic macular edema, both eyes, (OD > OS)  - FA (10.1.19) showed late leaking MA OU -- no NV  - delayed f/u on 4.16.20 due to hospitalization for sepsis / gallstones  - S/P PRP OD (07.14.20) - good laser surrounding, room for posterior fill in if needed  - S/P PRP OS (08.26.20)  - S/P IVA OD #1 (09.17.19), #2 (10.29.19), #3 (11.26.19), #4 (01.02.20), #5 (01.30.20), #6 (02.28.20), #7 (04.16.20), #8 (06.11.20), #9 (08.12.20), #10 (01.11.21)  - S/P IVA OS #1 (10.01.19), #2 (10.29.19), #3 (11.26.19), #4 (01.02.20), #5 (01.30.20), #6 (02.28.20), #7 (04.16.20), #8 (05.14.20), #9 (06.11.20), #10 (07.09.20), #11 (08.12.20), #12 (01.11.21)  - S/P IVK OD #1 (05.14.20) -- no significant improvement  - switched back to Avastin OD (#  9 6.11.2020)  - repeated Eylea4U benefits investigation due to possible IVA resistance (8.12.20)  - S/P IVE OS #1 (09.16.20), #2 (10.16.20), #3 (11.13.20), #4 (12.11.20), #5 (02.08.21)  - S/P IVE OD #1 (09.16.20), #2 (10.16.20), #3 (11.13.20), #4 (12.11.20), #5 (02.08.21)  - OCT today shows persistent IRF OD; mild interval improvement in IRF OS  - BCVA: OD slightly decreased to 20/30 from 20/25 and OS stable at 20/30   - recommend IVE OU today (OD #6 and OS #6), 03.012.21 - Eylea4U benefits investigation completed and verified for  2021 as of 02.08.21  - pt wishes to proceed  - RBA of procedure discussed, questions answered  - informed consent obtained  - see procedure note  - Eylea informed consent form signed and scanned on 12.11.2020  - f/u 4 weeks, DFE, OCT, possible injection  3,4. Hypertensive retinopathy OU  - discussed importance of tight BP control  - monitor  5. Pseudophakia OU  - s/p CE/IOL OU in [place], [place]  - beautiful surgeries, doing well  - monitor   Ophthalmic Meds Ordered this visit:  Meds ordered this encounter  Medications  . aflibercept (EYLEA) SOLN 2 mg  . aflibercept (EYLEA) SOLN 2 mg       Return in about 4 weeks (around 01/06/2020) for f/u NPDR OU, DFE, OCT.  There are no Patient Instructions on file for this visit.   Explained the diagnoses, plan, and follow up with the patient and they expressed understanding.  Patient expressed understanding of the importance of proper follow up care.   This document serves as a record of services personally performed by Gardiner Sleeper, MD, PhD. It was created on their behalf by Ernest Mallick, OA, an ophthalmic assistant. The creation of this record is the provider's dictation and/or activities during the visit.    Electronically signed by: Ernest Mallick, OA 03.09.2021 11:49 AM  Gardiner Sleeper, M.D., Ph.D. Diseases & Surgery of the Retina and Vitreous [facility]  I have reviewed the above documentation for accuracy and completeness, and I agree with the above. Gardiner Sleeper, M.D., Ph.D. 12/09/19 11:50 AM   Abbreviations: M myopia (nearsighted); A astigmatism; H hyperopia (farsighted); P presbyopia; Mrx spectacle prescription;  CTL contact lenses; OD right eye; OS left eye; OU both eyes  XT exotropia; ET esotropia; PEK punctate epithelial keratitis; PEE punctate epithelial erosions; DES dry eye syndrome; MGD meibomian gland dysfunction; ATs artificial tears; PFAT's preservative free artificial tears; Luis M. Cintron nuclear  sclerotic cataract; PSC posterior subcapsular cataract; ERM epi-retinal membrane; PVD posterior vitreous detachment; RD retinal detachment; DM diabetes mellitus; DR diabetic retinopathy; NPDR non-proliferative diabetic retinopathy; PDR proliferative diabetic retinopathy; CSME clinically significant macular edema; DME diabetic macular edema; dbh dot blot hemorrhages; CWS cotton wool spot; POAG primary open angle glaucoma; C/D cup-to-disc ratio; HVF humphrey visual field; GVF goldmann visual field; OCT optical coherence tomography; IOP intraocular pressure; BRVO Branch retinal vein occlusion; CRVO central retinal vein occlusion; CRAO central retinal artery occlusion; BRAO branch retinal artery occlusion; RT retinal tear; SB scleral buckle; PPV pars plana vitrectomy; VH Vitreous hemorrhage; PRP panretinal laser photocoagulation; IVK intravitreal kenalog; VMT vitreomacular traction; MH Macular hole;  NVD neovascularization of the disc; NVE neovascularization elsewhere; AREDS age related eye disease study; ARMD age related macular degeneration; POAG primary open angle glaucoma; EBMD epithelial/anterior basement membrane dystrophy; ACIOL anterior chamber intraocular lens; IOL intraocular lens; PCIOL posterior chamber intraocular lens; Phaco/IOL phacoemulsification with intraocular lens placement; PRK photorefractive keratectomy; LASIK laser assisted  in situ keratomileusis; HTN hypertension; DM diabetes mellitus; COPD chronic obstructive pulmonary disease

## 2019-12-09 ENCOUNTER — Encounter (INDEPENDENT_AMBULATORY_CARE_PROVIDER_SITE_OTHER): Payer: Self-pay | Admitting: Ophthalmology

## 2019-12-09 ENCOUNTER — Ambulatory Visit (INDEPENDENT_AMBULATORY_CARE_PROVIDER_SITE_OTHER): Payer: Medicare HMO | Admitting: Ophthalmology

## 2019-12-09 DIAGNOSIS — H3581 Retinal edema: Secondary | ICD-10-CM | POA: Diagnosis not present

## 2019-12-09 DIAGNOSIS — E113313 Type 2 diabetes mellitus with moderate nonproliferative diabetic retinopathy with macular edema, bilateral: Secondary | ICD-10-CM

## 2019-12-09 DIAGNOSIS — Z961 Presence of intraocular lens: Secondary | ICD-10-CM | POA: Diagnosis not present

## 2019-12-09 DIAGNOSIS — I10 Essential (primary) hypertension: Secondary | ICD-10-CM

## 2019-12-09 DIAGNOSIS — H35033 Hypertensive retinopathy, bilateral: Secondary | ICD-10-CM | POA: Diagnosis not present

## 2019-12-09 MED ORDER — AFLIBERCEPT 2MG/0.05ML IZ SOLN FOR KALEIDOSCOPE
2.0000 mg | INTRAVITREAL | Status: AC | PRN
  Administered 2019-12-09: 2 mg via INTRAVITREAL

## 2019-12-13 DIAGNOSIS — F419 Anxiety disorder, unspecified: Secondary | ICD-10-CM | POA: Diagnosis not present

## 2019-12-13 DIAGNOSIS — I10 Essential (primary) hypertension: Secondary | ICD-10-CM | POA: Diagnosis not present

## 2019-12-13 DIAGNOSIS — Z01118 Encounter for examination of ears and hearing with other abnormal findings: Secondary | ICD-10-CM | POA: Diagnosis not present

## 2019-12-13 DIAGNOSIS — E782 Mixed hyperlipidemia: Secondary | ICD-10-CM | POA: Diagnosis not present

## 2019-12-13 DIAGNOSIS — E118 Type 2 diabetes mellitus with unspecified complications: Secondary | ICD-10-CM | POA: Diagnosis not present

## 2019-12-13 DIAGNOSIS — Z1329 Encounter for screening for other suspected endocrine disorder: Secondary | ICD-10-CM | POA: Diagnosis not present

## 2019-12-13 DIAGNOSIS — H538 Other visual disturbances: Secondary | ICD-10-CM | POA: Diagnosis not present

## 2019-12-13 DIAGNOSIS — N400 Enlarged prostate without lower urinary tract symptoms: Secondary | ICD-10-CM | POA: Diagnosis not present

## 2019-12-13 DIAGNOSIS — Z0001 Encounter for general adult medical examination with abnormal findings: Secondary | ICD-10-CM | POA: Diagnosis not present

## 2019-12-13 DIAGNOSIS — Z01021 Encounter for examination of eyes and vision following failed vision screening with abnormal findings: Secondary | ICD-10-CM | POA: Diagnosis not present

## 2019-12-13 DIAGNOSIS — Z125 Encounter for screening for malignant neoplasm of prostate: Secondary | ICD-10-CM | POA: Diagnosis not present

## 2019-12-13 DIAGNOSIS — I2510 Atherosclerotic heart disease of native coronary artery without angina pectoris: Secondary | ICD-10-CM | POA: Diagnosis not present

## 2019-12-14 ENCOUNTER — Ambulatory Visit: Payer: Medicare HMO | Admitting: Cardiology

## 2019-12-14 ENCOUNTER — Other Ambulatory Visit: Payer: Self-pay

## 2019-12-14 ENCOUNTER — Encounter: Payer: Self-pay | Admitting: Cardiology

## 2019-12-14 VITALS — Temp 96.8°F | Resp 14 | Ht 67.5 in | Wt 185.0 lb

## 2019-12-14 DIAGNOSIS — I951 Orthostatic hypotension: Secondary | ICD-10-CM

## 2019-12-14 DIAGNOSIS — I739 Peripheral vascular disease, unspecified: Secondary | ICD-10-CM

## 2019-12-14 DIAGNOSIS — E1122 Type 2 diabetes mellitus with diabetic chronic kidney disease: Secondary | ICD-10-CM | POA: Insufficient documentation

## 2019-12-14 DIAGNOSIS — E7800 Pure hypercholesterolemia, unspecified: Secondary | ICD-10-CM | POA: Diagnosis not present

## 2019-12-14 DIAGNOSIS — I6523 Occlusion and stenosis of bilateral carotid arteries: Secondary | ICD-10-CM

## 2019-12-14 DIAGNOSIS — N183 Chronic kidney disease, stage 3 unspecified: Secondary | ICD-10-CM | POA: Diagnosis not present

## 2019-12-14 DIAGNOSIS — I10 Essential (primary) hypertension: Secondary | ICD-10-CM | POA: Diagnosis not present

## 2019-12-14 MED ORDER — EZETIMIBE 10 MG PO TABS: 10.0000 mg | ORAL_TABLET | Freq: Every day | ORAL | 1 refills | Status: DC

## 2019-12-14 NOTE — Patient Instructions (Signed)
Endovascular Therapy for Peripheral Arterial Disease Peripheral arterial disease (PAD) is a condition in which the arteries that supply blood to the arms and legs (limbs) are too narrow. The narrowing is due to plaque buildup (atherosclerosis). Plaque is made up of fat, cholesterol, calcium, or other substances that can build up inside blood vessels. PAD can cause pain and numbness due to lack of blood flow, particularly in the legs. Endovascular therapy is a procedure to widen a narrowed blood vessel and improve blood flow. Endovascular means the procedure is done inside your artery, using a long, thin tube (catheter). The catheter is inserted into an incision in your leg and moved up your artery until it reaches the narrow part. A balloon or a small metal tube (stent) may be used to help widen the narrow artery and keep it open. Your health care provider may recommend endovascular therapy if lifestyle changes and medicines are not enough to improve your PAD. In some cases--such as when more than one artery is affected--you may need more than one procedure. Tell a health care provider about:  Any allergies you have.  All medicines you are taking, including vitamins, herbs, eye drops, creams, and over-the-counter medicines.  Any problems you or family members have had with anesthetic medicines.  Any blood disorders you have.  Any surgeries you have had.  Any medical conditions you have.  Whether you are pregnant or may be pregnant. What are the risks? Generally, this is a safe procedure. However, problems may occur, including:  Infection.  Bleeding.  Allergic reactions to medicines, materials, or dyes.  Damage to other structures or organs.  Heart attack.  Stroke.  Blood clots.  Kidney problems.  Nerve damage.  The stent moving out of place, becoming blocked, or not working.  Loss of your affected arm or leg. What happens before the procedure? Medicines  Ask your health  care provider about: ? Changing or stopping your regular medicines. This is especially important if you are taking diabetes medicines or blood thinners. ? Taking medicines such as aspirin and ibuprofen. These medicines can thin your blood. Do not take these medicines unless your health care provider tells you to take them. ? Taking over-the-counter medicines, vitamins, herbs, and supplements.  You may be given antibiotic medicine to help prevent infection. General instructions  Do not use any products that contain nicotine or tobacco, such as cigarettes and e-cigarettes. If you need help quitting, ask your health care provider.  Follow instructions from your health care provider about eating or drinking restrictions.  You will have blood tests and a physical exam. You may have other tests, such as: ? Ankle-brachial index (ABI). This test compares blood pressure in your ankle and arm. This can indicate narrowing or blockage in your leg arteries. ? Doppler ultrasound. This test uses sound waves to check blood flow. ? CT scan. This test uses dye to check blood flow and blockages in your leg arteries. ? MRI. ? Electrocardiogram (ECG) to check the electrical patterns and rhythms of the heart.  You may be asked to shower with a germ-killing soap.  Ask your health care provider how your surgical site will be marked or identified.  Plan to have someone take you home from the hospital. What happens during the procedure?   To lower your risk of infection: ? Your health care team will wash or sanitize their hands. ? Hair may be removed from the surgical area. ? Your skin will be washed with soap.    An IV will be inserted into one of your veins.  You will be given one or more of the following: ? A medicine to help you relax (sedative). ? A medicine to numb the area for the procedure (local anesthetic).  A puncture will be made in your upper thigh area, in the femoral artery or the iliac  artery. Rarely, a puncture may be made in the ankle area. A small incision may be made instead of a puncture.  A small wire will be inserted into the artery and moved through the artery.  Dye will be injected into your artery, and X-rays will be used to help identify where the blockage is. The dye helps to make the blood flow visible on X-rays.  A catheter will be inserted into the same spot as the small wire. It will be moved up the artery to reach the blocked or narrow part.  A small, deflated balloon will be inserted over the wire and into the catheter. It will be moved up the artery to reach the blocked or narrow part.  The small balloon will be filled with air (inflated) to widen the narrow part of the artery.  The balloon will be deflated.  A stent may be placed in the widened part of the artery to keep the artery open.  The wire and catheter will be removed.  A small catheter (urinary catheter) may be placed to drain urine from your bladder. This catheter may be used temporarily to drain urine during or after the procedure.  Your puncture or incision may be closed with a stitch (suture) or skin glue.  Your puncture or incision may be covered with a bandage (dressing). The procedure may vary among health care providers and hospitals. What happens after the procedure?  Your blood pressure, heart rate, breathing rate, and blood oxygen level will be monitored until the medicines you were given have worn off.  You will need to stay in bed as directed.  You may continue to have a catheter draining your urine.  You will be encouraged to drink fluids to wash (flush) the dye out of your body.  You will be given pain medicine as needed.  If you were given a sedative, do not drive for 24 hours or until your health care provider says that driving is safe for you. Summary  Endovascular therapy is a procedure to widen a narrowed blood vessel and improve blood flow.  This procedure  may be recommended if lifestyle changes and medicines are not enough to improve your peripheral arterial disease (PAD).  After the procedure, you will need to stay in bed and you will be encouraged to drink fluids to wash (flush) dye out of your body. This information is not intended to replace advice given to you by your health care provider. Make sure you discuss any questions you have with your health care provider. Document Revised: 09/10/2018 Document Reviewed: 01/08/2017 Elsevier Patient Education  2020 [employer]  

## 2019-12-14 NOTE — H&P (View-Only) (Signed)
Primary Physician:  [name], MD   Patient ID: Justin [name], male    DOB: 1933-03-07, 86 y.o.   MRN: [id number]  Subjective:    Chief Complaint  Patient presents with  . PAD with claudication  . Follow-up    4 month     HPI: Justin [name]  is a 86 y.o. male  with history of CAD, S/P CABG in 2009, PAD, type II diabetes with autonomic insufficiency and autonomic orthostatic hypotension, asymptomatic carotid stenosis, and hyperlipidemia.  He has patent graft except occluded SVG to RCA which is diffusely diseased small vessel and also severe below-knee bilateral lower disease by angiography on 11/04/2016.   He presents for 6-week office visit, continues to state that his calves hurt even with minimal activities and is wondering if there is any new medicine he can take.  He is presently on best medical therapy including aspirin and Xarelto for PAD.  He has not noticed any ulceration or skin breakdown.  Denies chest pain, dyspnea on exertion has been stable.   Past Medical History:  Diagnosis Date  . Anxiety   . Arthritis   . Coronary artery disease   . Diabetes mellitus without complication ([place])   . Diabetic retinopathy ([place])    NPDR OU  . Diverticulitis   . Dyspnea   . GERD (gastroesophageal reflux disease)   . Hypercholesteremia   . Hypertension   . Hypertensive retinopathy    OU  . Pneumonia    as a child  . Prostate cancer ([place])   . UTI (lower urinary tract infection)     Past Surgical History:  Procedure Laterality Date  . ABDOMINAL SURGERY     for diverticulitis; also removed appendix  . CATARACT EXTRACTION    . CATARACT EXTRACTION, BILATERAL    . CHOLECYSTECTOMY N/A 10/12/2017   Procedure: LAPAROSCOPIC CHOLECYSTECTOMY;  Surgeon: [name], MD;  Location: [place] [facility];  Service: General;  Laterality: N/A;  . CORONARY ARTERY BYPASS GRAFT  2009  . ERCP N/A 12/21/2018   Procedure: ENDOSCOPIC RETROGRADE CHOLANGIOPANCREATOGRAPHY (ERCP);  Surgeon: [name], MD;  Location: [place];  Service: Endoscopy;  Laterality: N/A;  . ESOPHAGOGASTRODUODENOSCOPY (EGD) WITH PROPOFOL N/A 12/17/2018   Procedure: ESOPHAGOGASTRODUODENOSCOPY (EGD) WITH PROPOFOL;  Surgeon: [name], MD;  Location: [place];  Service: Endoscopy;  Laterality: N/A;  . EUS Left 12/17/2018   Procedure: UPPER ENDOSCOPIC ULTRASOUND (EUS) LINEAR;  Surgeon: [name], MD;  Location: [place];  Service: Endoscopy;  Laterality: Left;  . EYE SURGERY     "for bleeding in eye"  . HERNIA REPAIR    . LEFT HEART CATH AND CORONARY ANGIOGRAPHY N/A 11/04/2016   Procedure: Left Heart Cath and Coronary Angiography;  Surgeon: [name], MD;  Location: [facility];  Service: Cardiovascular;  Laterality: N/A;  . LOWER EXTREMITY ANGIOGRAPHY N/A 11/04/2016   Procedure: Lower Extremity Angiography;  Surgeon: [name], MD;  Location: [place] [facility];  Service: Cardiovascular;  Laterality: N/A;  . PROSTATE SURGERY    . REMOVAL OF STONES  12/21/2018   Procedure: REMOVAL OF STONES;  Surgeon: [name], MD;  Location[facility] END[facility]COPY;  Service: Endoscopy;;  . [name]  12/21/2018   Procedure: [name];  Surgeon: [name], MD;  Location: [facility] ENDOSCOPY;  Service: Endoscopy;;   Social History   Tobacco Use  . Smoking status: Former Smoker    Years: 2.00  . Smokeless tobacco: Never Used  . Tobacco comment: when he was a teenage age [age]  Substance Use Topics  . Alcohol use: No     Review of Systems  Cardiovascular: Positive for claudication, dyspnea on exertion and leg swelling. Negative for chest pain, orthopnea and syncope.  Musculoskeletal: Positive for back pain and muscle weakness.  Neurological: Positive for dizziness.  All other systems reviewed and are negative.  Objective:  Temperature (!) 96.8 F (36 C), temperature source Temporal, resp. rate 14, height 5' 7.5" (1.715 m), weight 185 lb (83.9 kg), SpO2 99 %. Body mass index is 28.55 kg/m.    Vitals with BMI 12/14/2019 11/14/2019 05/13/2019  Height 5' 7.5" 5' 7.5" 5' 7.5"  Weight 185 lbs 186 lbs 180 lbs 13 oz  BMI 28.53 [id number]  Systolic - [id number] 0000000  Diastolic - 67 59  Pulse - 70 72    Orthostatic VS for the past 72 hrs (Last 3 readings):  Orthostatic BP Patient Position BP Location Cuff Size Orthostatic Pulse  12/14/19 1241 94/64 Standing -- -- --  12/14/19 1240 130/76 Supine -- -- --  12/14/19 1145 122/63 Sitting Left Arm Large 63    Physical Exam  Constitutional: Vital signs are normal. He appears well-developed and well-nourished.  Cardiovascular: Normal rate, regular rhythm, normal heart sounds and intact distal pulses.  Pulses:      Carotid pulses are on the right side with bruit and on the left side with bruit.      Femoral pulses are 2+ on the right side and 2+ on the left side.      Popliteal pulses are 1+ on the right side and 1+ on the left side.       Dorsalis pedis pulses are 0 on the right side and 0 on the left side.       Posterior tibial pulses are 0 on the right side and 0 on the left side.  Split S2. NO JVD. No leg edema  Pulmonary/Chest: Effort normal and breath sounds normal. No accessory muscle usage. No respiratory distress.  Abdominal: Soft. Bowel sounds are normal.  Vitals reviewed.  Radiology: No results found.  Laboratory examination:    CMP Latest Ref Rng & Units 03/21/2019 12/23/2018 12/22/2018  Glucose 65 - 99 mg/dL 183(H) 114(H) 199(H)  BUN 8 - 27 mg/dL 30(H) 14 14  Creatinine 0.76 - 1.27 mg/dL 1.74(H) 1.30(H) 1.47(H)  Sodium 134 - 144 mmol/L 141 139 135  Potassium 3.5 - 5.2 mmol/L 4.1 3.5 4.1  Chloride 96 - 106 mmol/L 107(H) 113(H) 112(H)  CO2 20 - 29 mmol/L 21 22 20(L)  Calcium 8.6 - 10.2 mg/dL 9.8 8.1(L) 7.6(L)  Total Protein 6.0 - 8.5 g/dL 7.1 5.2(L) 5.1(L)  Total Bilirubin 0.0 - 1.2 mg/dL 0.3 1.2 1.8(H)  Alkaline Phos 39 - 117 IU/L 83 133(H) 152(H)  AST 0 - 40 IU/L 16 39 40  ALT 0 - 44 IU/L 16 42 40   CBC Latest Ref Rng  & Units 12/23/2018 12/22/2018 12/21/2018  WBC 4.0 - 10.5 K/uL 8.5 7.8 8.7  Hemoglobin 13.0 - 17.0 g/dL 9.2(L) 9.1(L) 8.9(L)  Hematocrit 39.0 - 52.0 % 28.1(L) 28.8(L) 27.7(L)  Platelets 150 - 400 K/uL 162 149(L) 141(L)   Lipid Panel  No results found for: CHOL, TRIG, HDL, CHOLHDL, VLDL, LDLCALC, LDLDIRECT HEMOGLOBIN A1C Lab Results  Component Value Date   HGBA1C 8.0 (H) 10/08/2017   MPG 182.9 10/08/2017   TSH No results for input(s): TSH in the last 8760 hours.   External labs:  Cholesterol, total 155.000 M 03/14/2019 HDL 61.000 M 03/14/2019  LDL 105 NHDL 94 Triglycerides 67.000 M 03/14/2019   A1C 7.100 % 06/08/2019; TSH 1.530 03/14/2019  Hemoglobin 12.600 G/ 07/04/2019; INR 1.300 12/14/2018 Platelets 198.000 X 07/04/2019  Creatinine, Serum 1.700 MG/ 07/04/2019 Potassium 4.100 03/21/2019 Magnesium 2.000 MG/ 09/17/2016 ALT (SGPT) 16.000 03/21/2019  BNP 46.800 PG 10/13/2016  PRN Meds:. Medications Discontinued During This Encounter  Medication Reason  . nitroGLYCERIN (NITROSTAT) 0.4 MG SL tablet Expired Prescription   Current Meds  Medication Sig  . ACCU-CHEK SMARTVIEW test strip   . acetaminophen (TYLENOL) 500 MG tablet Take 500 mg by mouth daily as needed for mild pain.  Marland Kitchen AMITIZA 24 MCG capsule Take 24 mcg by mouth as needed for constipation.   Marland Kitchen aspirin EC 81 MG tablet Take 81 mg by mouth daily.  . chlorthalidone (HYGROTON) 25 MG tablet Take 0.5 tablets (12.5 mg total) by mouth daily.  . clonazePAM (KLONOPIN) 0.5 MG tablet Take 0.5 mg by mouth daily as needed for anxiety.  . isosorbide mononitrate (IMDUR) 60 MG 24 hr tablet Take 1 tablet by mouth once daily  . JANUVIA 100 MG tablet Take 100 mg by mouth daily.   Marland Kitchen LANTUS SOLOSTAR 100 UNIT/ML Solostar Pen Inject 15-50 Units into the skin 2 (two) times daily. 50 in the morning 15 in the evening  . metoprolol (LOPRESSOR) 50 MG tablet Take 25 mg by mouth 2 (two) times daily.  Marland Kitchen omeprazole (PRILOSEC) 40 MG capsule Take by mouth  daily.   . rosuvastatin (CRESTOR) 20 MG tablet Take 1 tablet (20 mg total) by mouth daily.  . tamsulosin (FLOMAX) 0.4 MG CAPS capsule Take 0.4 mg by mouth daily.  . vitamin B-12 (CYANOCOBALAMIN) 1000 MCG tablet Take 1,000 mcg by mouth daily.  . vitamin B-6 (PYRIDOXINE) 25 MG tablet Take 25 mg by mouth daily.  [name] 2.5 MG TABS tablet TAKE 1 TABLET TWICE DAILY   Current Facility-Administered Medications for the 12/14/19 encounter (Office Visit) with [name], MD  Medication  . Bevacizumab (AVASTIN) SOLN 1.25 mg  . Bevacizumab (AVASTIN) SOLN 1.25 mg  . Bevacizumab (AVASTIN) SOLN 1.25 mg  . Bevacizumab (AVASTIN) SOLN 1.25 mg    Cardiac Studies:   Lexiscan myoview stress test 04/17/2016: 1. Resting EKG demonstrates normal sinus rhythm, left axis deviation, right bundle branch block. Stress EKG is nondiagnostic for ischemia as a pharmacologic stress test. There are frequent PACs during the stress test. Stress symptoms included dyspnea. 2. The Perfusion images reveal the left ventricle to be mildly dilated at 132 mL both in rest and stress images. There is a moderate-sized inferior and inferoapical scar with very mild peri-infarct ischemia noted especially towards the apex. Left ventricle systolic function was moderate to severely depressed at 36%. 3. This is an intermediate risk study, clinical correlation recommended.    Peripheral arteriogram 11/04/2016: Normal abdominal aorta and aorto-iliac arteries. Minimal plaque bilateral SFA. Severe small vessel disease especially left leg at the ankle Medical therapy.  Coronary angiogram 11/04/2016: Native RCA small and diffusely diseased with distal 70-80% stenosis. Occluded SVG to RCA. Patent SVG to OM-RI, LIMA to LAD. Medical therapy.  Echo- 12/24/2017 1. Left ventricle cavity is normal in size. Moderate concentric hypertrophy of the left ventricle. Normal global echocardiogram wall motion. Doppler evidence of grade I (impaired) diastolic  dysfunction. Calculated EF 54%. 2. Left atrial cavity is mildly dilated. 3. Trileaflet aortic valve. Mild aortic valve leaflet calcification. 4. Mild to moderate mitral regurgitation. Mild calcification function of the mitral valve annulus. Mild mitral valve leaflet patient  later uptitrated to calcification. 5. Trace tricuspid regurgitation.   ABI 05/02/2019: No hemodynamically significant stenoses are identified in the bilateral lower extremity arterial system.   Non-compressible ABI bilateral, suggestive of medial calcinosis. Moderately abnormal waveform right and mildly abnormal waveform left lower extremity at the level of the ankle. Study suggests small vessel disease.  Carotid artery duplex 10/11/2019:  Stenosis in the right internal carotid artery (50-69%). The right PSV  internal/common carotid artery ratio of 4.88 is consistent with a stenosis  of >70%. Stenosis in the right external carotid artery (<50%).  Severe stenosis in the left internal carotid artery (50-69%).  Stenosis in the left external carotid artery (<50%).  Peak velocity in the right ICA is 152/24 cm/s and left 203/27 cm/s. Antegrade right vertebral artery flow. Antegrade left vertebral artery  flow.  No significant change since 05/18/2019. Follow up in six months is  appropriate if clinically indicated.  Lower Extremity Arterial Duplex 11/30/2019:  No hemodynamically significant stenosis in bilateral lower extremity above  the knee.  Moderate velocity increase at the right mid posterior tibial artery.  suggests >50%stenosis.  Diffuse small vessel disease with moderately abnormal waveform right ankle  and severely abnormal waveform left ankle.  Non compressible vessels bilateral at the ankles suggests medial  calcinosis.   No significant change from 05/02/2019.  Assessment:     ICD-10-CM   1. Claudication in peripheral vascular disease (HCC)  I73.9 ezetimibe (ZETIA) 10 MG tablet    CBC    Basic metabolic panel   2. Asymptomatic bilateral carotid artery stenosis  I65.23   3. Primary hypertension  I10   4. Orthostatic hypotension  I95.1   5. CKD stage 3 due to type 2 diabetes mellitus (HCC)  E11.22    N18.30   6. Hypercholesteremia  E78.00 ezetimibe (ZETIA) 10 MG tablet    EKG 11/14/2019: Normal sinus rhythm at 74 bpm with 1 PAC, left axis deviation, left anterior fasicular block, RBBB. Nonspecific T wave abnormality.  Recommendations:    Justin [name]  is a 86 y.o. male  with history of CAD, S/P CABG in 2009, PAD, type II diabetes with autonomic insufficiency and autonomic orthostatic hypotension, asymptomatic carotid stenosis, and hyperlipidemia.  He has patent graft except occluded SVG to RCA which is diffusely diseased small vessel and also severe below-knee bilateral lower disease by angiography on 11/04/2016.   Patient is here on 51-month office visit and follow-up for PAD.  He is mentioning worsening pain in bilateral feet particularly his left foot with walking.  Lower extremity arterial duplex revealing severe diffuse below-knee vessel disease.  However he is extremely symptomatic and has reduced his physical activity significantly and requested that I proceed with angiography.  Continue with ASA and low dose Xarelto.  Due to persistent symptoms of claudication, lower extremity arterial duplex performed.  This correlates with small vessel disease noted in 2018 peripheral angiography revealing severe bilateral below-knee disease left worse than the right.  Patient continues to have significant symptoms and he has reduced his physical activity due to this, hence as he is on appropriate guideline directed medical therapy, I would recommend proceeding with peripheral arteriogram again with eye towards revascularization of the small vessels.  He does have underlying chronic kidney disease, he is also presently 86 years of age, hence had to be extremely careful with amount of contrast exposure.  I have  discussed with him regarding this extensively including renal failure issues.  But patient's lifestyle is significantly affected and wants to  proceed.  I discussed recent carotid duplex results, has bilateral 60 to 70% stenosis that has been stable.  Will need continued 50-month follow-ups.  Blood pressure is well controlled, he does have orthostatic changes, I will reduce metoprolol but rate from 50 mg twice daily to 25 mg twice daily.  He has no symptoms of angina or clinical evidence of heart failure.    I reviewed his external labs, lipids are not well controlled.  I have added Zetia 10 mg daily.  He will need lipid profile testing at some point.  [name], MD, [facility] 12/14/2019, 1:00 PM [facility]. [place] Office: [phone]

## 2019-12-14 NOTE — Progress Notes (Signed)
Primary Physician:  [name], MD   Patient ID: Justin [name], male    DOB: 09/28/1933, 86 y.o.   MRN: [id number]  Subjective:    Chief Complaint  Patient presents with  . PAD with claudication  . Follow-up    4 month     HPI: Justin [name]  is a 86 y.o. male  with history of CAD, S/P CABG in 2009, PAD, type II diabetes with autonomic insufficiency and autonomic orthostatic hypotension, asymptomatic carotid stenosis, and hyperlipidemia.  He has patent graft except occluded SVG to RCA which is diffusely diseased small vessel and also severe below-knee bilateral lower disease by angiography on 11/04/2016.   He presents for 6-week office visit, continues to state that his calves hurt even with minimal activities and is wondering if there is any new medicine he can take.  He is presently on best medical therapy including aspirin and Xarelto for PAD.  He has not noticed any ulceration or skin breakdown.  Denies chest pain, dyspnea on exertion has been stable.   Past Medical History:  Diagnosis Date  . Anxiety   . Arthritis   . Coronary artery disease   . Diabetes mellitus without complication ([place])   . Diabetic retinopathy ([place])    NPDR OU  . Diverticulitis   . Dyspnea   . GERD (gastroesophageal reflux disease)   . Hypercholesteremia   . Hypertension   . Hypertensive retinopathy    OU  . Pneumonia    as a child  . Prostate cancer ([place])   . UTI (lower urinary tract infection)     Past Surgical History:  Procedure Laterality Date  . ABDOMINAL SURGERY     for diverticulitis; also removed appendix  . CATARACT EXTRACTION    . CATARACT EXTRACTION, BILATERAL    . CHOLECYSTECTOMY N/A 10/12/2017   Procedure: LAPAROSCOPIC CHOLECYSTECTOMY;  Surgeon: [name], MD;  Location: [facility];  Service: General;  Laterality: N/A;  . CORONARY ARTERY BYPASS GRAFT  2009  . ERCP N/A 12/21/2018   Procedure: ENDOSCOPIC RETROGRADE CHOLANGIOPANCREATOGRAPHY (ERCP);  Surgeon: [name], MD;  Location: [facility];  Service: Endoscopy;  Laterality: N/A;  . ESOPHAGOGASTRODUODENOSCOPY (EGD) WITH PROPOFOL N/A 12/17/2018   Procedure: ESOPHAGOGASTRODUODENOSCOPY (EGD) WITH PROPOFOL;  Surgeon: [name], MD;  Location: [facility];  Service: Endoscopy;  Laterality: N/A;  . EUS Left 12/17/2018   Procedure: UPPER ENDOSCOPIC ULTRASOUND (EUS) LINEAR;  Surgeon: [name], MD;  Location: [facility];  Service: Endoscopy;  Laterality: Left;  . EYE SURGERY     "for bleeding in eye"  . HERNIA REPAIR    . LEFT HEART CATH AND CORONARY ANGIOGRAPHY N/A 11/04/2016   Procedure: Left Heart Cath and Coronary Angiography;  Surgeon: [name], MD;  Location: [facility];  Service: Cardiovascular;  Laterality: N/A;  . LOWER EXTREMITY ANGIOGRAPHY N/A 11/04/2016   Procedure: Lower Extremity Angiography;  Surgeon: [name], MD;  Location: [place] [facility];  Service: Cardiovascular;  Laterality: N/A;  . PROSTATE SURGERY    . REMOVAL OF STONES  12/21/2018   Procedure: REMOVAL OF STONES;  Surgeon: [name], MD;  Location: [facility] ENDOSCOPY;  Service: Endoscopy;;  . [name]  12/21/2018   Procedure: [name];  Surgeon: [name], MD;  Location: [facility] ENDOSCOPY;  Service: Endoscopy;;   Social History   Tobacco Use  . Smoking status: Former Smoker    Years: 2.00  . Smokeless tobacco: Never Used  . Tobacco comment: when he was a teenage age [age]  Substance Use Topics  . Alcohol use: No     Review of Systems  Cardiovascular: Positive for claudication, dyspnea on exertion and leg swelling. Negative for chest pain, orthopnea and syncope.  Musculoskeletal: Positive for back pain and muscle weakness.  Neurological: Positive for dizziness.  All other systems reviewed and are negative.  Objective:  Temperature (!) 96.8 F (36 C), temperature source Temporal, resp. rate 14, height 5' 7.5" (1.715 m), weight 185 lb (83.9 kg), SpO2 99 %. Body mass index is 28.55 kg/m.   Vitals with BMI 12/14/2019 11/14/2019 05/13/2019  Height 5' 7.5" 5' 7.5" 5' 7.5"  Weight 185 lbs 186 lbs 180 lbs 13 oz  BMI 28.53 [id number] 0000000  Systolic - [id number] 0000000  Diastolic - 67 59  Pulse - 70 72    Orthostatic VS for the past 72 hrs (Last 3 readings):  Orthostatic BP Patient Position BP Location Cuff Size Orthostatic Pulse  12/14/19 1241 94/64 Standing -- -- --  12/14/19 1240 130/76 Supine -- -- --  12/14/19 1145 122/63 Sitting Left Arm Large 63    Physical Exam  Constitutional: Vital signs are normal. He appears well-developed and well-nourished.  Cardiovascular: Normal rate, regular rhythm, normal heart sounds and intact distal pulses.  Pulses:      Carotid pulses are on the right side with bruit and on the left side with bruit.      Femoral pulses are 2+ on the right side and 2+ on the left side.      Popliteal pulses are 1+ on the right side and 1+ on the left side.       Dorsalis pedis pulses are 0 on the right side and 0 on the left side.       Posterior tibial pulses are 0 on the right side and 0 on the left side.  Split S2. NO JVD. No leg edema  Pulmonary/Chest: Effort normal and breath sounds normal. No accessory muscle usage. No respiratory distress.  Abdominal: Soft. Bowel sounds are normal.  Vitals reviewed.  Radiology: No results found.  Laboratory examination:    CMP Latest Ref Rng & Units 03/21/2019 12/23/2018 12/22/2018  Glucose 65 - 99 mg/dL 183(H) 114(H) 199(H)  BUN 8 - 27 mg/dL 30(H) 14 14  Creatinine 0.76 - 1.27 mg/dL 1.74(H) 1.30(H) 1.47(H)  Sodium 134 - 144 mmol/L 141 139 135  Potassium 3.5 - 5.2 mmol/L 4.1 3.5 4.1  Chloride 96 - 106 mmol/L 107(H) 113(H) 112(H)  CO2 20 - 29 mmol/L 21 22 20(L)  Calcium 8.6 - 10.2 mg/dL 9.8 8.1(L) 7.6(L)  Total Protein 6.0 - 8.5 g/dL 7.1 5.2(L) 5.1(L)  Total Bilirubin 0.0 - 1.2 mg/dL 0.3 1.2 1.8(H)  Alkaline Phos 39 - 117 IU/L 83 133(H) 152(H)  AST 0 - 40 IU/L 16 39 40  ALT 0 - 44 IU/L 16 42 40   CBC Latest Ref Rng  & Units 12/23/2018 12/22/2018 12/21/2018  WBC 4.0 - 10.5 K/uL 8.5 7.8 8.7  Hemoglobin 13.0 - 17.0 g/dL 9.2(L) 9.1(L) 8.9(L)  Hematocrit 39.0 - 52.0 % 28.1(L) 28.8(L) 27.7(L)  Platelets 150 - 400 K/uL 162 149(L) 141(L)   Lipid Panel  No results found for: CHOL, TRIG, HDL, CHOLHDL, VLDL, LDLCALC, LDLDIRECT HEMOGLOBIN A1C Lab Results  Component Value Date   HGBA1C 8.0 (H) 10/08/2017   MPG 182.9 10/08/2017   TSH No results for input(s): TSH in the last 8760 hours.   External labs:  Cholesterol, total 155.000 M 03/14/2019 HDL 61.000 M 03/14/2019 LDL  105 NHDL 94 Triglycerides 67.000 M 03/14/2019   A1C 7.100 % 06/08/2019; TSH 1.530 03/14/2019  Hemoglobin 12.600 G/ 07/04/2019; INR 1.300 12/14/2018 Platelets 198.000 X 07/04/2019  Creatinine, Serum 1.700 MG/ 07/04/2019 Potassium 4.100 03/21/2019 Magnesium 2.000 MG/ 09/17/2016 ALT (SGPT) 16.000 03/21/2019  BNP 46.800 PG 10/13/2016  PRN Meds:. Medications Discontinued During This Encounter  Medication Reason  . nitroGLYCERIN (NITROSTAT) 0.4 MG SL tablet Expired Prescription   Current Meds  Medication Sig  . ACCU-CHEK SMARTVIEW test strip   . acetaminophen (TYLENOL) 500 MG tablet Take 500 mg by mouth daily as needed for mild pain.  Marland Kitchen AMITIZA 24 MCG capsule Take 24 mcg by mouth as needed for constipation.   Marland Kitchen aspirin EC 81 MG tablet Take 81 mg by mouth daily.  . chlorthalidone (HYGROTON) 25 MG tablet Take 0.5 tablets (12.5 mg total) by mouth daily.  . clonazePAM (KLONOPIN) 0.5 MG tablet Take 0.5 mg by mouth daily as needed for anxiety.  . isosorbide mononitrate (IMDUR) 60 MG 24 hr tablet Take 1 tablet by mouth once daily  . JANUVIA 100 MG tablet Take 100 mg by mouth daily.   Marland Kitchen LANTUS SOLOSTAR 100 UNIT/ML Solostar Pen Inject 15-50 Units into the skin 2 (two) times daily. 50 in the morning 15 in the evening  . metoprolol (LOPRESSOR) 50 MG tablet Take 25 mg by mouth 2 (two) times daily.  Marland Kitchen omeprazole (PRILOSEC) 40 MG capsule Take by mouth  daily.   . rosuvastatin (CRESTOR) 20 MG tablet Take 1 tablet (20 mg total) by mouth daily.  . tamsulosin (FLOMAX) 0.4 MG CAPS capsule Take 0.4 mg by mouth daily.  . vitamin B-12 (CYANOCOBALAMIN) 1000 MCG tablet Take 1,000 mcg by mouth daily.  . vitamin B-6 (PYRIDOXINE) 25 MG tablet Take 25 mg by mouth daily.  [name] 2.5 MG TABS tablet TAKE 1 TABLET TWICE DAILY   Current Facility-Administered Medications for the 12/14/19 encounter (Office Visit) with [name], MD  Medication  . Bevacizumab (AVASTIN) SOLN 1.25 mg  . Bevacizumab (AVASTIN) SOLN 1.25 mg  . Bevacizumab (AVASTIN) SOLN 1.25 mg  . Bevacizumab (AVASTIN) SOLN 1.25 mg    Cardiac Studies:   Lexiscan myoview stress test 04/17/2016: 1. Resting EKG demonstrates normal sinus rhythm, left axis deviation, right bundle branch block. Stress EKG is nondiagnostic for ischemia as a pharmacologic stress test. There are frequent PACs during the stress test. Stress symptoms included dyspnea. 2. The Perfusion images reveal the left ventricle to be mildly dilated at 132 mL both in rest and stress images. There is a moderate-sized inferior and inferoapical scar with very mild peri-infarct ischemia noted especially towards the apex. Left ventricle systolic function was moderate to severely depressed at 36%. 3. This is an intermediate risk study, clinical correlation recommended.    Peripheral arteriogram 11/04/2016: Normal abdominal aorta and aorto-iliac arteries. Minimal plaque bilateral SFA. Severe small vessel disease especially left leg at the ankle Medical therapy.  Coronary angiogram 11/04/2016: Native RCA small and diffusely diseased with distal 70-80% stenosis. Occluded SVG to RCA. Patent SVG to OM-RI, LIMA to LAD. Medical therapy.  Echo- 12/24/2017 1. Left ventricle cavity is normal in size. Moderate concentric hypertrophy of the left ventricle. Normal global echocardiogram wall motion. Doppler evidence of grade I (impaired) diastolic  dysfunction. Calculated EF 54%. 2. Left atrial cavity is mildly dilated. 3. Trileaflet aortic valve. Mild aortic valve leaflet calcification. 4. Mild to moderate mitral regurgitation. Mild calcification function of the mitral valve annulus. Mild mitral valve leaflet patient later  uptitrated to calcification. 5. Trace tricuspid regurgitation.   ABI 05/02/2019: No hemodynamically significant stenoses are identified in the bilateral lower extremity arterial system.   Non-compressible ABI bilateral, suggestive of medial calcinosis. Moderately abnormal waveform right and mildly abnormal waveform left lower extremity at the level of the ankle. Study suggests small vessel disease.  Carotid artery duplex 10/11/2019:  Stenosis in the right internal carotid artery (50-69%). The right PSV  internal/common carotid artery ratio of 4.88 is consistent with a stenosis  of >70%. Stenosis in the right external carotid artery (<50%).  Severe stenosis in the left internal carotid artery (50-69%).  Stenosis in the left external carotid artery (<50%).  Peak velocity in the right ICA is 152/24 cm/s and left 203/27 cm/s. Antegrade right vertebral artery flow. Antegrade left vertebral artery  flow.  No significant change since 05/18/2019. Follow up in six months is  appropriate if clinically indicated.  Lower Extremity Arterial Duplex 11/30/2019:  No hemodynamically significant stenosis in bilateral lower extremity above  the knee.  Moderate velocity increase at the right mid posterior tibial artery.  suggests >50%stenosis.  Diffuse small vessel disease with moderately abnormal waveform right ankle  and severely abnormal waveform left ankle.  Non compressible vessels bilateral at the ankles suggests medial  calcinosis.   No significant change from 05/02/2019.  Assessment:     ICD-10-CM   1. Claudication in peripheral vascular disease (HCC)  I73.9 ezetimibe (ZETIA) 10 MG tablet    CBC    Basic metabolic panel   2. Asymptomatic bilateral carotid artery stenosis  I65.23   3. Primary hypertension  I10   4. Orthostatic hypotension  I95.1   5. CKD stage 3 due to type 2 diabetes mellitus (HCC)  E11.22    N18.30   6. Hypercholesteremia  E78.00 ezetimibe (ZETIA) 10 MG tablet    EKG 11/14/2019: Normal sinus rhythm at 74 bpm with 1 PAC, left axis deviation, left anterior fasicular block, RBBB. Nonspecific T wave abnormality.  Recommendations:    Justin [name]  is a 86 y.o. male  with history of CAD, S/P CABG in 2009, PAD, type II diabetes with autonomic insufficiency and autonomic orthostatic hypotension, asymptomatic carotid stenosis, and hyperlipidemia.  He has patent graft except occluded SVG to RCA which is diffusely diseased small vessel and also severe below-knee bilateral lower disease by angiography on 11/04/2016.   Patient is here on 21-month office visit and follow-up for PAD.  He is mentioning worsening pain in bilateral feet particularly his left foot with walking.  Lower extremity arterial duplex revealing severe diffuse below-knee vessel disease.  However he is extremely symptomatic and has reduced his physical activity significantly and requested that I proceed with angiography.  Continue with ASA and low dose Xarelto.  Due to persistent symptoms of claudication, lower extremity arterial duplex performed.  This correlates with small vessel disease noted in 2018 peripheral angiography revealing severe bilateral below-knee disease left worse than the right.  Patient continues to have significant symptoms and he has reduced his physical activity due to this, hence as he is on appropriate guideline directed medical therapy, I would recommend proceeding with peripheral arteriogram again with eye towards revascularization of the small vessels.  He does have underlying chronic kidney disease, he is also presently 86 years of age, hence had to be extremely careful with amount of contrast exposure.  I have  discussed with him regarding this extensively including renal failure issues.  But patient's lifestyle is significantly affected and wants to proceed.  I discussed recent carotid duplex results, has bilateral 60 to 70% stenosis that has been stable.  Will need continued 65-month follow-ups.  Blood pressure is well controlled, he does have orthostatic changes, I will reduce metoprolol but rate from 50 mg twice daily to 25 mg twice daily.  He has no symptoms of angina or clinical evidence of heart failure.    I reviewed his external labs, lipids are not well controlled.  I have added Zetia 10 mg daily.  He will need lipid profile testing at some point.  [name], MD, [facility] 12/14/2019, 1:00 PM [facility] [place] Office: [phone]

## 2019-12-19 DIAGNOSIS — I739 Peripheral vascular disease, unspecified: Secondary | ICD-10-CM | POA: Diagnosis not present

## 2019-12-19 DIAGNOSIS — I2510 Atherosclerotic heart disease of native coronary artery without angina pectoris: Secondary | ICD-10-CM | POA: Diagnosis not present

## 2019-12-20 LAB — BASIC METABOLIC PANEL
BUN/Creatinine Ratio: 18 (ref 10–24)
BUN: 27 mg/dL (ref 8–27)
CO2: 20 mmol/L (ref 20–29)
Calcium: 9.2 mg/dL (ref 8.6–10.2)
Chloride: 106 mmol/L (ref 96–106)
Creatinine, Ser: 1.48 mg/dL — ABNORMAL HIGH (ref 0.76–1.27)
GFR calc Af Amer: 50 mL/min/{1.73_m2} — ABNORMAL LOW (ref >59)
GFR calc non Af Amer: 43 mL/min/{1.73_m2} — ABNORMAL LOW (ref >59)
Glucose: 271 mg/dL — ABNORMAL HIGH (ref 65–99)
Potassium: 4.2 mmol/L (ref 3.5–5.2)
Sodium: 139 mmol/L (ref 134–144)

## 2019-12-20 LAB — CBC
Hematocrit: 41.8 % (ref 37.5–51.0)
Hemoglobin: 13.2 g/dL (ref 13.0–17.7)
MCH: 27.5 pg (ref 26.6–33.0)
MCHC: 31.6 g/dL (ref 31.5–35.7)
MCV: 87 fL (ref 79–97)
Platelets: 203 10*3/uL (ref 150–450)
RBC: 4.8 x10E6/uL (ref 4.14–5.80)
RDW: 14.1 % (ref 11.6–15.4)
WBC: 9.1 10*3/uL (ref 3.4–10.8)

## 2019-12-30 ENCOUNTER — Other Ambulatory Visit (HOSPITAL_COMMUNITY)
Admission: RE | Admit: 2019-12-30 | Discharge: 2019-12-30 | Disposition: A | Discharge status: Home / Self Care | Payer: Medicare HMO | Source: Ambulatory Visit | Attending: Cardiology | Admitting: Cardiology

## 2019-12-30 DIAGNOSIS — Z20822 Contact with and (suspected) exposure to covid-19: Secondary | ICD-10-CM | POA: Diagnosis not present

## 2019-12-30 DIAGNOSIS — Z01812 Encounter for preprocedural laboratory examination: Secondary | ICD-10-CM | POA: Insufficient documentation

## 2019-12-30 LAB — SARS CORONAVIRUS 2 (TAT 6-24 HRS): SARS Coronavirus 2: NEGATIVE

## 2020-01-03 ENCOUNTER — Other Ambulatory Visit: Payer: Self-pay

## 2020-01-03 ENCOUNTER — Ambulatory Visit (HOSPITAL_COMMUNITY)
Admission: RE | Admit: 2020-01-03 | Discharge: 2020-01-03 | Disposition: A | Discharge status: Home / Self Care | Payer: Medicare HMO | Attending: Cardiology | Admitting: Cardiology

## 2020-01-03 ENCOUNTER — Encounter (HOSPITAL_COMMUNITY)
Admission: RE | Disposition: A | Discharge status: Home / Self Care | Payer: Self-pay | Source: Home / Self Care | Attending: Cardiology

## 2020-01-03 DIAGNOSIS — Z87891 Personal history of nicotine dependence: Secondary | ICD-10-CM | POA: Diagnosis not present

## 2020-01-03 DIAGNOSIS — I951 Orthostatic hypotension: Secondary | ICD-10-CM | POA: Diagnosis not present

## 2020-01-03 DIAGNOSIS — I6523 Occlusion and stenosis of bilateral carotid arteries: Secondary | ICD-10-CM | POA: Insufficient documentation

## 2020-01-03 DIAGNOSIS — E7800 Pure hypercholesterolemia, unspecified: Secondary | ICD-10-CM | POA: Diagnosis not present

## 2020-01-03 DIAGNOSIS — Z7901 Long term (current) use of anticoagulants: Secondary | ICD-10-CM | POA: Diagnosis not present

## 2020-01-03 DIAGNOSIS — H35039 Hypertensive retinopathy, unspecified eye: Secondary | ICD-10-CM | POA: Diagnosis not present

## 2020-01-03 DIAGNOSIS — K219 Gastro-esophageal reflux disease without esophagitis: Secondary | ICD-10-CM | POA: Diagnosis not present

## 2020-01-03 DIAGNOSIS — I739 Peripheral vascular disease, unspecified: Secondary | ICD-10-CM | POA: Diagnosis present

## 2020-01-03 DIAGNOSIS — E1151 Type 2 diabetes mellitus with diabetic peripheral angiopathy without gangrene: Secondary | ICD-10-CM | POA: Insufficient documentation

## 2020-01-03 DIAGNOSIS — Z794 Long term (current) use of insulin: Secondary | ICD-10-CM | POA: Insufficient documentation

## 2020-01-03 DIAGNOSIS — Z79899 Other long term (current) drug therapy: Secondary | ICD-10-CM | POA: Diagnosis not present

## 2020-01-03 DIAGNOSIS — I2510 Atherosclerotic heart disease of native coronary artery without angina pectoris: Secondary | ICD-10-CM | POA: Diagnosis not present

## 2020-01-03 DIAGNOSIS — Z951 Presence of aortocoronary bypass graft: Secondary | ICD-10-CM | POA: Diagnosis not present

## 2020-01-03 DIAGNOSIS — M1990 Unspecified osteoarthritis, unspecified site: Secondary | ICD-10-CM | POA: Diagnosis not present

## 2020-01-03 DIAGNOSIS — N183 Chronic kidney disease, stage 3 unspecified: Secondary | ICD-10-CM | POA: Diagnosis not present

## 2020-01-03 DIAGNOSIS — E785 Hyperlipidemia, unspecified: Secondary | ICD-10-CM | POA: Insufficient documentation

## 2020-01-03 DIAGNOSIS — E1122 Type 2 diabetes mellitus with diabetic chronic kidney disease: Secondary | ICD-10-CM | POA: Diagnosis not present

## 2020-01-03 DIAGNOSIS — I70213 Atherosclerosis of native arteries of extremities with intermittent claudication, bilateral legs: Secondary | ICD-10-CM | POA: Insufficient documentation

## 2020-01-03 DIAGNOSIS — I129 Hypertensive chronic kidney disease with stage 1 through stage 4 chronic kidney disease, or unspecified chronic kidney disease: Secondary | ICD-10-CM | POA: Insufficient documentation

## 2020-01-03 DIAGNOSIS — Z7982 Long term (current) use of aspirin: Secondary | ICD-10-CM | POA: Diagnosis not present

## 2020-01-03 DIAGNOSIS — F419 Anxiety disorder, unspecified: Secondary | ICD-10-CM | POA: Diagnosis not present

## 2020-01-03 DIAGNOSIS — E113293 Type 2 diabetes mellitus with mild nonproliferative diabetic retinopathy without macular edema, bilateral: Secondary | ICD-10-CM | POA: Insufficient documentation

## 2020-01-03 HISTORY — PX: LOWER EXTREMITY ANGIOGRAPHY: CATH118251

## 2020-01-03 LAB — POCT I-STAT, CHEM 8
BUN: 36 mg/dL — ABNORMAL HIGH (ref 8–23)
Calcium, Ion: 1.21 mmol/L (ref 1.15–1.40)
Chloride: 110 mmol/L (ref 98–111)
Creatinine, Ser: 1.5 mg/dL — ABNORMAL HIGH (ref 0.61–1.24)
Glucose, Bld: 124 mg/dL — ABNORMAL HIGH (ref 70–99)
HCT: 38 % — ABNORMAL LOW (ref 39.0–52.0)
Hemoglobin: 12.9 g/dL — ABNORMAL LOW (ref 13.0–17.0)
Potassium: 5.1 mmol/L (ref 3.5–5.1)
Sodium: 143 mmol/L (ref 135–145)
TCO2: 24 mmol/L (ref 22–32)

## 2020-01-03 LAB — GLUCOSE, CAPILLARY: Glucose-Capillary: 108 mg/dL — ABNORMAL HIGH (ref 70–99)

## 2020-01-03 SURGERY — LOWER EXTREMITY ANGIOGRAPHY
Anesthesia: LOCAL

## 2020-01-03 MED ORDER — SODIUM CHLORIDE 0.9 % IV SOLN: INTRAVENOUS | Status: DC

## 2020-01-03 MED ORDER — MIDAZOLAM HCL 2 MG/2ML IJ SOLN
INTRAMUSCULAR | Status: AC
  Filled 2020-01-03: qty 2

## 2020-01-03 MED ORDER — DIPHENHYDRAMINE HCL 50 MG/ML IJ SOLN
INTRAMUSCULAR | Status: AC
  Filled 2020-01-03: qty 1

## 2020-01-03 MED ORDER — LIDOCAINE HCL (PF) 1 % IJ SOLN
INTRAMUSCULAR | Status: AC
  Filled 2020-01-03: qty 30

## 2020-01-03 MED ORDER — SODIUM CHLORIDE 0.9 % IV SOLN: 250.0000 mL | INTRAVENOUS | Status: DC | PRN

## 2020-01-03 MED ORDER — METHYLPREDNISOLONE SODIUM SUCC 125 MG IJ SOLR
INTRAMUSCULAR | Status: AC
  Filled 2020-01-03: qty 2

## 2020-01-03 MED ORDER — IODIXANOL 320 MG/ML IV SOLN
INTRAVENOUS | Status: DC | PRN
  Administered 2020-01-03: 45 mL via INTRA_ARTERIAL

## 2020-01-03 MED ORDER — METHYLPREDNISOLONE SODIUM SUCC 125 MG IJ SOLR
INTRAMUSCULAR | Status: DC | PRN
  Administered 2020-01-03: 125 mg via INTRAVENOUS

## 2020-01-03 MED ORDER — LIDOCAINE HCL (PF) 1 % IJ SOLN
INTRAMUSCULAR | Status: DC | PRN
  Administered 2020-01-03: 15 mL via INTRADERMAL

## 2020-01-03 MED ORDER — FENTANYL CITRATE (PF) 100 MCG/2ML IJ SOLN
INTRAMUSCULAR | Status: AC
  Filled 2020-01-03: qty 2

## 2020-01-03 MED ORDER — MIDAZOLAM HCL 2 MG/2ML IJ SOLN
INTRAMUSCULAR | Status: DC | PRN
  Administered 2020-01-03: 1 mg via INTRAVENOUS

## 2020-01-03 MED ORDER — SODIUM CHLORIDE 0.9% FLUSH: 3.0000 mL | Freq: Two times a day (BID) | INTRAVENOUS | Status: DC

## 2020-01-03 MED ORDER — HEPARIN (PORCINE) IN NACL 1000-0.9 UT/500ML-% IV SOLN
INTRAVENOUS | Status: AC
  Filled 2020-01-03: qty 1000

## 2020-01-03 MED ORDER — FENTANYL CITRATE (PF) 100 MCG/2ML IJ SOLN
INTRAMUSCULAR | Status: DC | PRN
  Administered 2020-01-03: 25 ug via INTRAVENOUS

## 2020-01-03 MED ORDER — SODIUM CHLORIDE 0.9 % IV BOLUS
500.0000 mL | Freq: Once | INTRAVENOUS | Status: AC
  Administered 2020-01-03: 500 mL via INTRAVENOUS

## 2020-01-03 MED ORDER — DIPHENHYDRAMINE HCL 50 MG/ML IJ SOLN
INTRAMUSCULAR | Status: DC | PRN
  Administered 2020-01-03: 25 mg via INTRAVENOUS

## 2020-01-03 MED ORDER — HEPARIN (PORCINE) IN NACL 1000-0.9 UT/500ML-% IV SOLN
INTRAVENOUS | Status: DC | PRN
  Administered 2020-01-03 (×2): 500 mL

## 2020-01-03 MED ORDER — SODIUM CHLORIDE 0.9% FLUSH: 3.0000 mL | INTRAVENOUS | Status: DC | PRN

## 2020-01-03 SURGICAL SUPPLY — 15 items
CATH OMNI FLUSH 5F 65CM (CATHETERS) ×1 IMPLANT
CATH TEMPO AQUA 5F 100CM (CATHETERS) ×1 IMPLANT
CLOSURE MYNX CONTROL 5F (Vascular Products) ×1 IMPLANT
FILTER CO2 0.2 MICRON (VASCULAR PRODUCTS) ×1 IMPLANT
KIT MICROPUNCTURE NIT STIFF (SHEATH) ×1 IMPLANT
KIT PV (KITS) ×2 IMPLANT
RESERVOIR CO2 (VASCULAR PRODUCTS) ×1 IMPLANT
SET FLUSH CO2 (MISCELLANEOUS) ×1 IMPLANT
SHEATH PINNACLE 5F 10CM (SHEATH) ×1 IMPLANT
SHEATH PROBE COVER 6X72 (BAG) ×1 IMPLANT
SYR MEDRAD MARK 7 150ML (SYRINGE) ×2 IMPLANT
TRANSDUCER W/STOPCOCK (MISCELLANEOUS) ×2 IMPLANT
TRAY PV CATH (CUSTOM PROCEDURE TRAY) ×2 IMPLANT
WIRE BENTSON .035X145CM (WIRE) ×1 IMPLANT
WIRE HI TORQ VERSACORE J 260CM (WIRE) ×1 IMPLANT

## 2020-01-03 NOTE — Interval H&P Note (Signed)
History and Physical Interval Note:  01/03/2020 10:10 AM  Justin [name]  has presented today for surgery, with the diagnosis of claudication.  The various methods of treatment have been discussed with the patient and family. After consideration of risks, benefits and other options for treatment, the patient has consented to  Procedure(s): LOWER EXTREMITY ANGIOGRAPHY (N/A) and possible angioplasty as a surgical intervention.  The patient's history has been reviewed, patient examined, no change in status, stable for surgery.  I have reviewed the patient's chart and labs.  Questions were answered to the patient's satisfaction.     [name]

## 2020-01-03 NOTE — Discharge Instructions (Signed)

## 2020-01-03 NOTE — Progress Notes (Signed)
Discharge instructions was given to patient and daughter. Both verbalized understanding.

## 2020-01-05 ENCOUNTER — Other Ambulatory Visit: Payer: Self-pay | Admitting: Cardiology

## 2020-01-05 NOTE — Progress Notes (Signed)
[facility] Note  01/06/2020     CHIEF COMPLAINT Patient presents for Retina Follow Up   HISTORY OF PRESENT ILLNESS: [name] is a 86 y.o. male who presents to the clinic today for:   HPI    Retina Follow Up    Patient presents with  Diabetic Retinopathy.  In both eyes.  This started 4 weeks ago.  Severity is moderate.  Duration of weeks.  Since onset it is stable.  I, the attending physician,  performed the HPI with the patient and updated documentation appropriately.          Comments    Pt states vision is about the same OU.  Pt denies eye pain or discomfort.  Patient denies any new or worsening floaters or fol.       Last edited by [name], MD on 01/06/2020 10:57 AM. (History)    Pt states no changes in vision OU   Referring physician: [name], MD [address] [place],  [name] [id number]  HISTORICAL INFORMATION:   Selected notes from the [name] Referred by Dr. [name] for concern of mac edema OU LEE:  Ocular Hx-pseudo OU; formerly followed at [name] Dr. [name]. S/p focal laser OS x2, history of IVK and IVT OU PMH-anxiety, arthritis, CAD, DM (taking lantus and januvia), HTN, high cholesterol, prostate cancer    CURRENT MEDICATIONS: No current outpatient medications on file. (Ophthalmic Drugs)   No current facility-administered medications for this visit. (Ophthalmic Drugs)   Current Outpatient Medications (Other)  Medication Sig  . ACCU-CHEK SMARTVIEW test strip   . acetaminophen (TYLENOL) 500 MG tablet Take 1,000 mg by mouth every 4 (four) hours as needed for mild pain.   Marland Kitchen AMITIZA 24 MCG capsule Take 24 mcg by mouth as needed for constipation.   Marland Kitchen aspirin EC 81 MG tablet Take 81 mg by mouth daily.  . chlorthalidone (HYGROTON) 50 MG tablet Take by mouth daily.  . clonazePAM (KLONOPIN) 0.5 MG tablet Take 0.5 mg by mouth daily as needed for anxiety.  .  DULoxetine HCl 40 MG CPEP Take 40 mg by mouth in the morning and at bedtime.  Marland Kitchen ezetimibe (ZETIA) 10 MG tablet Take 1 tablet (10 mg total) by mouth daily after supper.  . isosorbide mononitrate (IMDUR) 60 MG 24 hr tablet Take 1 tablet by mouth once daily  . JANUVIA 100 MG tablet Take 100 mg by mouth daily.   Marland Kitchen LANTUS SOLOSTAR 100 UNIT/ML Solostar Pen Inject 15-50 Units into the skin See admin instructions. Inject 50 units in the morning and 15 units in the evening  . metoprolol (LOPRESSOR) 50 MG tablet Take 25 mg by mouth 2 (two) times daily.  Marland Kitchen omeprazole (PRILOSEC) 40 MG capsule Take 40 mg by mouth daily.   . rosuvastatin (CRESTOR) 20 MG tablet Take 1 tablet (20 mg total) by mouth daily.  . tamsulosin (FLOMAX) 0.4 MG CAPS capsule Take 0.4 mg by mouth daily.  . vitamin B-12 (CYANOCOBALAMIN) 1000 MCG tablet Take 1,000 mcg by mouth daily.  . vitamin B-6 (PYRIDOXINE) 25 MG tablet Take 25 mg by mouth daily.   No current facility-administered medications for this visit. (Other)      REVIEW OF SYSTEMS: ROS    Positive for: Gastrointestinal, Genitourinary, Endocrine, Cardiovascular, Eyes   Negative for: Constitutional, Neurological, Skin, Musculoskeletal, HENT, Respiratory, Psychiatric, Allergic/Imm, Heme/Lymph   Last edited by Doneen Poisson on 01/06/2020  9:38 AM. (History)       ALLERGIES Allergies  Allergen Reactions  . Contrast Media [Iodinated Diagnostic Agents] Itching    PT STATES ALLERGY TO "CT DYE".  [name] [Red Dye] Swelling    CAT scan dye  . Ibuprofen Other (See Comments)    Upset stomach     PAST MEDICAL HISTORY Past Medical History:  Diagnosis Date  . Anxiety   . Arthritis   . Coronary artery disease   . Diabetes mellitus without complication ([name])   . Diabetic retinopathy ([place])    NPDR OU  . Diverticulitis   . Dyspnea   . GERD (gastroesophageal reflux disease)   . Hypercholesteremia   . Hypertension   . Hypertensive retinopathy    OU  . Pneumonia     as a child  . Prostate cancer ([name])   . UTI (lower urinary tract infection)    Past Surgical History:  Procedure Laterality Date  . ABDOMINAL SURGERY     for diverticulitis; also removed appendix  . CATARACT EXTRACTION    . CATARACT EXTRACTION, BILATERAL    . CHOLECYSTECTOMY N/A 10/12/2017   Procedure: LAPAROSCOPIC CHOLECYSTECTOMY;  Surgeon: [name], MD;  Location: [place];  Service: General;  Laterality: N/A;  . CORONARY ARTERY BYPASS GRAFT  2009  . ERCP N/A 12/21/2018   Procedure: ENDOSCOPIC RETROGRADE CHOLANGIOPANCREATOGRAPHY (ERCP);  Surgeon: [name], MD;  Location: [place];  Service: Endoscopy;  Laterality: N/A;  . ESOPHAGOGASTRODUODENOSCOPY (EGD) WITH PROPOFOL N/A 12/17/2018   Procedure: ESOPHAGOGASTRODUODENOSCOPY (EGD) WITH PROPOFOL;  Surgeon: [name], MD;  Location: [name];  Service: Endoscopy;  Laterality: N/A;  . EUS Left 12/17/2018   Procedure: UPPER ENDOSCOPIC ULTRASOUND (EUS) LINEAR;  Surgeon: [name], MD;  Location: [name];  Service: Endoscopy;  Laterality: Left;  . EYE SURGERY     "for bleeding in eye"  . HERNIA REPAIR    . LEFT HEART CATH AND CORONARY ANGIOGRAPHY N/A 11/04/2016   Procedure: Left Heart Cath and Coronary Angiography;  Surgeon: [name], MD;  Location: [place] [name];  Service: Cardiovascular;  Laterality: N/A;  . LOWER EXTREMITY ANGIOGRAPHY N/A 11/04/2016   Procedure: Lower Extremity Angiography;  Surgeon: [name], MD;  Location: [place] [name];  Service: Cardiovascular;  Laterality: N/A;  . LOWER EXTREMITY ANGIOGRAPHY N/A 01/03/2020   Procedure: LOWER EXTREMITY ANGIOGRAPHY;  Surgeon: [name], MD;  Location: [name];  Service: Cardiovascular;  Laterality: N/A;  . PROSTATE SURGERY    . REMOVAL OF STONES  12/21/2018   Procedure: REMOVAL OF STONES;  Surgeon: [name], MD;  Location: [name];  Service: Endoscopy;;  . [name]  12/21/2018   Procedure: [name];  Surgeon: [name], MD;  Location: [name];  Service: Endoscopy;;    FAMILY HISTORY Family History  Problem Relation Age of Onset  . Diabetes Father   . Diabetes Maternal Aunt   . Diabetes Maternal Uncle   . Diabetes Maternal Grandmother   . Heart disease Sister   . Diabetes Brother   . Heart disease Sister     SOCIAL HISTORY Social History   Tobacco Use  . Smoking status: Former Smoker    Years: 2.00  . Smokeless tobacco: Never Used  . Tobacco comment: when he was a teenage age [age]  Substance Use Topics  . Alcohol use: No  . Drug use: No         OPHTHALMIC EXAM:  Base Eye Exam    Visual  Acuity (Snellen - Linear)      Right Left   Dist cc 20/30 -1 20/40   Dist ph cc 20/30 +2 20/30 -1   Correction: Glasses       Tonometry (Tonopen, 9:44 AM)      Right Left   Pressure 11 11       Pupils      Dark Light Shape React APD   Right 3 2 Round Brisk 0   Left 3 2 Round Brisk 0       Visual Fields      Left Right    Full Full       Extraocular Movement      Right Left    Full Full       Neuro/Psych    Oriented x3: Yes   Mood/Affect: Normal       Dilation    Both eyes: 1.0% Mydriacyl, 2.5% Phenylephrine @ 9:44 AM        Slit Lamp and Fundus Exam    Slit Lamp Exam      Right Left   Lids/Lashes Dermatochalasis - upper lid, Meibomian gland dysfunction Dermatochalasis - upper lid, Meibomian gland dysfunction   Conjunctiva/Sclera Melanosis Melanosis   Cornea Arcus, 1+ Punctate epithelial erosions Arcus, 1+ Punctate epithelial erosions   Anterior Chamber Deep and quiet Deep and quiet   Iris Mild Temporal Iris atrophy, Round and dilated, No NVI Mild Temporal Iris atrophy, Round and dilated, No NVI   Lens PC IOL in good position with mild aterior capsule fimosis PC IOL in good position with mild aterior capsule fimosis   Vitreous Vitreous syneresis, Posterior vitreous detachment, vitreous condensations Vitreous syneresis, Posterior vitreous detachment, vitreous  condensations       Fundus Exam      Right Left   Disc pallor with sharp rim, Peripapillary atrophy, Compact mild pallor, sharp rim, Tilted disc, mild temporal Temporal Peripapillary atrophy, Compact   C/D Ratio 0.2 0.3   Macula Blunted foveal reflex, Retinal pigment epithelial mottling and clumping, Microaneurysms -- improved, improved central cystic changes  Blunted foveal reflex, focal area of RPE atrophy SN to fovea, Microaneurysms, Retinal pigment epithelial mottling, refractile Epiretinal membrane,  central cystic changes improved   Vessels Vascular attenuation, Tortuous Tortuous, Vascular attenuation   Periphery Attached, scattered MA, good peripheral PRP 360 - room for posterior fill in if needed Attached, scattered IRH, Peripheral PRP 360.        Refraction    Wearing Rx      Sphere Cylinder Axis Add   Right +0.25 +0.50 010 +3.00   Left [place] +0.75 173 +3.00   Type: Bifocal          IMAGING AND PROCEDURES  Imaging and Procedures for _0 @  OCT, Retina - OU - Both Eyes       Right Eye Quality was good. Central Foveal Thickness: 375. Progression has improved. Findings include abnormal foveal contour, intraretinal fluid, no SRF, retinal drusen , outer retinal atrophy (Mild interval improvement in cystic changes).   Left Eye Quality was good. Central Foveal Thickness: 265. Progression has improved. Findings include abnormal foveal contour, no SRF, intraretinal fluid, outer retinal atrophy, retinal drusen  (Mild interval improvement in IRF ).   Notes *Images captured and stored on drive  Diagnosis / Impression:  DME OU, OD>OS OD: mild interval improvement in  IRF  OS: Mild interval improvement in IRF   Clinical management:  See below  Abbreviations: NFP - Normal foveal profile. CME -  cystoid macular edema. PED - pigment epithelial detachment. IRF - intraretinal fluid. SRF - subretinal fluid. EZ - ellipsoid zone. ERM - epiretinal membrane. ORA - outer retinal  atrophy. ORT - outer retinal tubulation. SRHM - subretinal hyper-reflective material         Intravitreal Injection, Pharmacologic Agent - OD - Right Eye       Time Out 01/06/2020. 9:47 AM. Confirmed correct patient, procedure, site, and patient consented.   Anesthesia Topical anesthesia was used. Anesthetic medications included Lidocaine 2%, Proparacaine 0.5%.   Procedure Preparation included eyelid speculum, 5% betadine to ocular surface. A (32g) needle was used.   Injection:  2 mg aflibercept Alfonse Flavors) SOLN   NDC: A3590391, Lot: [id number], Expiration date: 05/25/2020   Route: Intravitreal, Site: Right Eye, Waste: 0.05 mL  Post-op Post injection exam found visual acuity of at least counting fingers. The patient tolerated the procedure well. There were no complications. The patient received written and verbal post procedure care education.        Intravitreal Injection, Pharmacologic Agent - OS - Left Eye       Time Out 01/06/2020. 9:49 AM. Confirmed correct patient, procedure, site, and patient consented.   Anesthesia Topical anesthesia was used. Anesthetic medications included Proparacaine 0.5%, Lidocaine 2%.   Procedure Preparation included 5% betadine to ocular surface, eyelid speculum. A (32g ) needle was used.   Injection:  2 mg aflibercept Alfonse Flavors) SOLN   NDC: A3590391, Lot: [id number], Expiration date: 06/25/2020   Route: Intravitreal, Site: Left Eye, Waste: 0.05 mL  Post-op Post injection exam found visual acuity of at least counting fingers. The patient tolerated the procedure well. There were no complications. The patient received written and verbal post procedure care education.                 ASSESSMENT/PLAN:    ICD-10-CM   1. Moderate nonproliferative diabetic retinopathy of both eyes with macular edema associated with type 2 diabetes mellitus (HCC)  G95.6213 Intravitreal Injection, Pharmacologic Agent - OD - Right Eye    Intravitreal  Injection, Pharmacologic Agent - OS - Left Eye    aflibercept (EYLEA) SOLN 2 mg    aflibercept (EYLEA) SOLN 2 mg  2. Retinal edema  H35.81 OCT, Retina - OU - Both Eyes  3. Essential hypertension  I10   4. Hypertensive retinopathy of both eyes  H35.033   5. Pseudophakia of both eyes  Z96.1     1,2. Moderate non-proliferative diabetic retinopathy with edema OU   - moved to [place] from [place], [place] -- history of prior injections OS with Dr. [name]  - records received and reviewed from [facility] (Dr. [name])  - history of focal laser OS x2 and IVK/IVT OU in [place] -- last injection was IVK OS November 2013  - initial exam: scattered IRH, DBH and +macular edema  - initial OCT: diabetic macular edema, both eyes, (OD > OS)  - FA (10.1.19) showed late leaking MA OU -- no NV  - delayed f/u on 4.16.20 due to hospitalization for sepsis / gallstones  - S/P PRP OD (07.14.20) - good laser surrounding, room for posterior fill in if needed  - S/P PRP OS (08.26.20)  - S/P IVA OD #1 (09.17.19), #2 (10.29.19), #3 (11.26.19), #4 (01.02.20), #5 (01.30.20), #6 (02.28.20), #7 (04.16.20), #8 (06.11.20), #9 (08.12.20), #10 (01.11.21)  - S/P IVA OS #1 (10.01.19), #2 (10.29.19), #3 (11.26.19), #4 (01.02.20), #5 (01.30.20), #6 (02.28.20), #7 (04.16.20), #8 (05.14.20), #9 (06.11.20), #10 (07.09.20), #11 (  08.12.20), #12 (01.11.21)  - S/P IVK OD #1 (05.14.20) -- no significant improvement  - switched back to Avastin OD (#9 6.11.2020)  - repeated Eylea4U benefits investigation due to possible IVA resistance (8.12.20)  - S/P IVE OS #1 (09.16.20), #2 (10.16.20), #3 (11.13.20), #4 (12.11.20), #5 (02.08.21), #6 (03.12.21)  - S/P IVE OD #1 (09.16.20), #2 (10.16.20), #3 (11.13.20), #4 (12.11.20), #5 (02.08.21), #6 (03.12.21)  - OCT today shows interval improvement in IRF OU  - BCVA: OD stable at 20/30+2 and OS stable at 20/30   - recommend IVE OU today (OD #7 and OS #7), 04.08.21 - Eylea4U benefits  investigation completed and verified for 2021 as of 02.08.21  - pt wishes to proceed  - RBA of procedure discussed, questions answered  - informed consent obtained  - see procedure note  - Eylea informed consent form signed and scanned on 12.11.2020  - f/u 5 weeks, DFE, OCT, possible injection  3,4. Hypertensive retinopathy OU  - discussed importance of tight BP control  - monitor  5. Pseudophakia OU  - s/p CE/IOL OU in [place], [place]  - beautiful surgeries, doing well  - monitor   Ophthalmic Meds Ordered this visit:  Meds ordered this encounter  Medications  . aflibercept (EYLEA) SOLN 2 mg  . aflibercept (EYLEA) SOLN 2 mg       Return in about 5 weeks (around 02/10/2020) for DFE, OCT.  There are no Patient Instructions on file for this visit.   Explained the diagnoses, plan, and follow up with the patient and they expressed understanding.  Patient expressed understanding of the importance of proper follow up care.   This document serves as a record of services personally performed by Gardiner Sleeper, MD, PhD. It was created on their behalf by Roselee Nova, COMT. The creation of this record is the provider's dictation and/or activities during the visit.  Electronically signed by: Roselee Nova, COMT 01/08/20 4:02 PM   Gardiner Sleeper, M.D., Ph.D. Diseases & Surgery of the Retina and [place]  I have reviewed the above documentation for accuracy and completeness, and I agree with the above. Gardiner Sleeper, M.D., Ph.D. 01/08/20 4:02 PM   Abbreviations: M myopia (nearsighted); A astigmatism; H hyperopia (farsighted); P presbyopia; Mrx spectacle prescription;  CTL contact lenses; OD right eye; OS left eye; OU both eyes  XT exotropia; ET esotropia; PEK punctate epithelial keratitis; PEE punctate epithelial erosions; DES dry eye syndrome; MGD meibomian gland dysfunction; ATs artificial tears; PFAT's preservative free artificial tears; [place]  nuclear sclerotic cataract; PSC posterior subcapsular cataract; ERM epi-retinal membrane; PVD posterior vitreous detachment; RD retinal detachment; DM diabetes mellitus; DR diabetic retinopathy; NPDR non-proliferative diabetic retinopathy; PDR proliferative diabetic retinopathy; CSME clinically significant macular edema; DME diabetic macular edema; dbh dot blot hemorrhages; CWS cotton wool spot; POAG primary open angle glaucoma; C/D cup-to-disc ratio; HVF humphrey visual field; GVF goldmann visual field; OCT optical coherence tomography; IOP intraocular pressure; BRVO Branch retinal vein occlusion; CRVO central retinal vein occlusion; CRAO central retinal artery occlusion; BRAO branch retinal artery occlusion; RT retinal tear; SB scleral buckle; PPV pars plana vitrectomy; VH Vitreous hemorrhage; PRP panretinal laser photocoagulation; IVK intravitreal kenalog; VMT vitreomacular traction; MH Macular hole;  NVD neovascularization of the disc; NVE neovascularization elsewhere; AREDS age related eye disease study; ARMD age related macular degeneration; POAG primary open angle glaucoma; EBMD epithelial/anterior basement membrane dystrophy; ACIOL anterior chamber intraocular lens; IOL intraocular lens; PCIOL posterior chamber intraocular lens; Phaco/IOL  phacoemulsification with intraocular lens placement; [place] photorefractive keratectomy; LASIK laser assisted in situ keratomileusis; HTN hypertension; DM diabetes mellitus; COPD chronic obstructive pulmonary disease

## 2020-01-06 ENCOUNTER — Ambulatory Visit (INDEPENDENT_AMBULATORY_CARE_PROVIDER_SITE_OTHER): Payer: Medicare HMO | Admitting: Ophthalmology

## 2020-01-06 ENCOUNTER — Encounter (INDEPENDENT_AMBULATORY_CARE_PROVIDER_SITE_OTHER): Payer: Self-pay | Admitting: Ophthalmology

## 2020-01-06 DIAGNOSIS — I10 Essential (primary) hypertension: Secondary | ICD-10-CM | POA: Diagnosis not present

## 2020-01-06 DIAGNOSIS — H3581 Retinal edema: Secondary | ICD-10-CM | POA: Diagnosis not present

## 2020-01-06 DIAGNOSIS — Z961 Presence of intraocular lens: Secondary | ICD-10-CM | POA: Diagnosis not present

## 2020-01-06 DIAGNOSIS — H35033 Hypertensive retinopathy, bilateral: Secondary | ICD-10-CM | POA: Diagnosis not present

## 2020-01-06 DIAGNOSIS — E113313 Type 2 diabetes mellitus with moderate nonproliferative diabetic retinopathy with macular edema, bilateral: Secondary | ICD-10-CM | POA: Diagnosis not present

## 2020-01-08 MED ORDER — AFLIBERCEPT 2MG/0.05ML IZ SOLN FOR KALEIDOSCOPE
2.0000 mg | INTRAVITREAL | Status: AC | PRN
  Administered 2020-01-08: 16:00:00 2 mg via INTRAVITREAL

## 2020-01-11 DIAGNOSIS — K5900 Constipation, unspecified: Secondary | ICD-10-CM | POA: Diagnosis not present

## 2020-01-11 DIAGNOSIS — K219 Gastro-esophageal reflux disease without esophagitis: Secondary | ICD-10-CM | POA: Diagnosis not present

## 2020-01-13 DIAGNOSIS — E1151 Type 2 diabetes mellitus with diabetic peripheral angiopathy without gangrene: Secondary | ICD-10-CM | POA: Diagnosis not present

## 2020-01-13 DIAGNOSIS — Z794 Long term (current) use of insulin: Secondary | ICD-10-CM | POA: Diagnosis not present

## 2020-01-13 DIAGNOSIS — Z86718 Personal history of other venous thrombosis and embolism: Secondary | ICD-10-CM | POA: Diagnosis not present

## 2020-01-13 DIAGNOSIS — I509 Heart failure, unspecified: Secondary | ICD-10-CM | POA: Diagnosis not present

## 2020-01-13 DIAGNOSIS — E1142 Type 2 diabetes mellitus with diabetic polyneuropathy: Secondary | ICD-10-CM | POA: Diagnosis not present

## 2020-01-13 DIAGNOSIS — D6869 Other thrombophilia: Secondary | ICD-10-CM | POA: Diagnosis not present

## 2020-01-13 DIAGNOSIS — E261 Secondary hyperaldosteronism: Secondary | ICD-10-CM | POA: Diagnosis not present

## 2020-01-13 DIAGNOSIS — E1122 Type 2 diabetes mellitus with diabetic chronic kidney disease: Secondary | ICD-10-CM | POA: Diagnosis not present

## 2020-01-13 DIAGNOSIS — Z95820 Peripheral vascular angioplasty status with implants and grafts: Secondary | ICD-10-CM | POA: Diagnosis not present

## 2020-01-13 DIAGNOSIS — Z951 Presence of aortocoronary bypass graft: Secondary | ICD-10-CM | POA: Diagnosis not present

## 2020-01-13 DIAGNOSIS — Z7982 Long term (current) use of aspirin: Secondary | ICD-10-CM | POA: Diagnosis not present

## 2020-01-13 DIAGNOSIS — I25118 Atherosclerotic heart disease of native coronary artery with other forms of angina pectoris: Secondary | ICD-10-CM | POA: Diagnosis not present

## 2020-01-13 DIAGNOSIS — Z86711 Personal history of pulmonary embolism: Secondary | ICD-10-CM | POA: Diagnosis not present

## 2020-01-13 DIAGNOSIS — F3342 Major depressive disorder, recurrent, in full remission: Secondary | ICD-10-CM | POA: Diagnosis not present

## 2020-01-17 DIAGNOSIS — E1142 Type 2 diabetes mellitus with diabetic polyneuropathy: Secondary | ICD-10-CM | POA: Diagnosis not present

## 2020-01-17 DIAGNOSIS — E1165 Type 2 diabetes mellitus with hyperglycemia: Secondary | ICD-10-CM | POA: Diagnosis not present

## 2020-01-17 DIAGNOSIS — R1013 Epigastric pain: Secondary | ICD-10-CM | POA: Diagnosis not present

## 2020-01-17 DIAGNOSIS — E782 Mixed hyperlipidemia: Secondary | ICD-10-CM | POA: Diagnosis not present

## 2020-01-17 DIAGNOSIS — I2510 Atherosclerotic heart disease of native coronary artery without angina pectoris: Secondary | ICD-10-CM | POA: Diagnosis not present

## 2020-01-17 DIAGNOSIS — F419 Anxiety disorder, unspecified: Secondary | ICD-10-CM | POA: Diagnosis not present

## 2020-01-17 DIAGNOSIS — N3000 Acute cystitis without hematuria: Secondary | ICD-10-CM | POA: Diagnosis not present

## 2020-01-17 DIAGNOSIS — I10 Essential (primary) hypertension: Secondary | ICD-10-CM | POA: Diagnosis not present

## 2020-01-17 DIAGNOSIS — Z794 Long term (current) use of insulin: Secondary | ICD-10-CM | POA: Diagnosis not present

## 2020-01-20 DIAGNOSIS — N3281 Overactive bladder: Secondary | ICD-10-CM | POA: Diagnosis not present

## 2020-01-20 DIAGNOSIS — N400 Enlarged prostate without lower urinary tract symptoms: Secondary | ICD-10-CM | POA: Diagnosis not present

## 2020-01-20 DIAGNOSIS — C61 Malignant neoplasm of prostate: Secondary | ICD-10-CM | POA: Diagnosis not present

## 2020-01-20 NOTE — Progress Notes (Signed)
Primary Physician:  [name], MD   Patient ID: Justin [name], male    DOB: April 27, 1933, 86 y.o.   MRN: [id number]  Chief Complaint  Patient presents with  . PAD  . Hyperlipidemia  . Follow-up    6 week   HPI:    Justin [name]  is a 86 y.o. male  male with history of CAD, S/P CABG in 2009, PAD, type II diabetes with autonomic insufficiency and autonomic orthostatic hypotension, asymptomatic carotid stenosis, and hyperlipidemia.  He has patent graft except occluded SVG to RCA which is diffusely diseased small vessel and also severe below-knee bilateral lower disease by angiography on 11/04/2016 and repeat peripheral arteriogram on 01/03/20 and has severe slow flow suggestive of microvascular disease.   He presents for 6-week follow up office visit for PAD, On 01/03/2020 I have performed a lower extremity angiography at [facility], that showed very slow flow suggestive of microvascular disease and also small vessel disease.  I have recommended that unless there is critical limb ischemia there is no reason for angioplasty, medical therapy will be continued.  No new symptoms, still has cramping in his legs.  He was discharged home on isosorbide mononitrate after the peripheral angiography.  On his last office visit I had added Zetia 10 mg daily, for his uncontrolled hyperlipidemia. His blood pressure was well controlled, however due to reported orthostatic changes I had reduced his metoprolol from 50 mg twice daily to 25 mg twice daily.  Past Medical History:  Diagnosis Date  . Anxiety   . Arthritis   . Coronary artery disease   . Diabetes mellitus without complication ([place])   . Diabetic retinopathy ([place])    NPDR OU  . Diverticulitis   . Dyspnea   . GERD (gastroesophageal reflux disease)   . Hypercholesteremia   . Hypertension   . Hypertensive retinopathy    OU  . Pneumonia    as a child  . Prostate cancer ([place])   . UTI (lower urinary tract infection)     Past  Surgical History:  Procedure Laterality Date  . ABDOMINAL SURGERY     for diverticulitis; also removed appendix  . CATARACT EXTRACTION    . CATARACT EXTRACTION, BILATERAL    . CHOLECYSTECTOMY N/A 10/12/2017   Procedure: LAPAROSCOPIC CHOLECYSTECTOMY;  Surgeon: [name], MD;  Location: [place];  Service: General;  Laterality: N/A;  . CORONARY ARTERY BYPASS GRAFT  2009  . ERCP N/A 12/21/2018   Procedure: ENDOSCOPIC RETROGRADE CHOLANGIOPANCREATOGRAPHY (ERCP);  Surgeon: [name], MD;  Location: [place];  Service: Endoscopy;  Laterality: N/A;  . ESOPHAGOGASTRODUODENOSCOPY (EGD) WITH PROPOFOL N/A 12/17/2018   Procedure: ESOPHAGOGASTRODUODENOSCOPY (EGD) WITH PROPOFOL;  Surgeon: [name], MD;  Location: [place];  Service: Endoscopy;  Laterality: N/A;  . EUS Left 12/17/2018   Procedure: UPPER ENDOSCOPIC ULTRASOUND (EUS) LINEAR;  Surgeon: [name], MD;  Location: [place];  Service: Endoscopy;  Laterality: Left;  . EYE SURGERY     "for bleeding in eye"  . HERNIA REPAIR    . LEFT HEART CATH AND CORONARY ANGIOGRAPHY N/A 11/04/2016   Procedure: Left Heart Cath and Coronary Angiography;  Surgeon: [name], MD;  Location: [place] [facility];  Service: Cardiovascular;  Laterality: N/A;  . LOWER EXTREMITY ANGIOGRAPHY N/A 11/04/2016   Procedure: Lower Extremity Angiography;  Surgeon: [name], MD;  Location: [place] [facility];  Service: Cardiovascular;  Laterality: N/A;  . LOWER EXTREMITY ANGIOGRAPHY N/A 01/03/2020   Procedure: LOWER  EXTREMITY ANGIOGRAPHY;  Surgeon: [name], MD;  Location: [place] [facility];  Service: Cardiovascular;  Laterality: N/A;  . PROSTATE SURGERY    . REMOVAL OF STONES  12/21/2018   Procedure: REMOVAL OF STONES;  Surgeon: [name], MD;  Location[facility] END[facility]COPY;  Service: Endoscopy;;  . [name]  12/21/2018   Procedure: [name];  Surgeon: [name], MD;  Location[facility] END[facility]COPY;  Service: Endoscopy;;   Social History   Tobacco  Use  . Smoking status: Former Smoker    Packs/day: 0.25    Years: 2.00    Pack years: 0.50    Types: Cigarettes  . Smokeless tobacco: Never Used  . Tobacco comment: when he was a teenage age [age]  Substance Use Topics  . Alcohol use: No    Marital Status: Widowed  ROS   Review of Systems  Cardiovascular: Positive for claudication, dyspnea on exertion and leg swelling. Negative for chest pain, orthopnea and syncope.  Musculoskeletal: Positive for back pain and muscle weakness.  Neurological: Positive for dizziness.  All other systems reviewed and are negative.  Objective  Blood pressure (!) 142/70, pulse 63, temperature 98.2 F (36.8 C), temperature source Temporal, resp. rate 17, height 5\' 7"  (1.702 m), weight 186 lb (84.4 kg), SpO2 99 %. Body mass index is 29.13 kg/m.  Vitals with BMI 01/23/2020 01/03/2020 01/03/2020  Height 5\' 7"  - -  Weight 186 lbs - -  BMI [id number] - -  Systolic A999333 [id number]  Diastolic 70 63 66  Pulse 63 58 63    Orthostatic VS for the past 72 hrs (Last 3 readings):  Patient Position BP Location Cuff Size  01/23/20 1013 Sitting Left Arm Large    Physical Exam  Constitutional: Vital signs are normal. He appears well-developed and well-nourished.  Cardiovascular: Normal rate, regular rhythm, normal heart sounds and intact distal pulses.  Pulses:      Carotid pulses are on the right side with bruit and on the left side with bruit.      Femoral pulses are 2+ on the right side and 2+ on the left side.      Popliteal pulses are 1+ on the right side and 1+ on the left side.       Dorsalis pedis pulses are 0 on the right side and 0 on the left side.       Posterior tibial pulses are 0 on the right side and 0 on the left side.  Split S2. NO JVD. No leg edema  Pulmonary/Chest: Effort normal and breath sounds normal. No accessory muscle usage. No respiratory distress.  Abdominal: Soft. Bowel sounds are normal.  Vitals reviewed.  Laboratory examination:    CMP  Latest Ref Rng & Units 01/03/2020 12/19/2019 03/21/2019  Glucose 70 - 99 mg/dL 124(H) 271(H) 183(H)  BUN 8 - 23 mg/dL 36(H) 27 30(H)  Creatinine 0.61 - 1.24 mg/dL 1.50(H) 1.48(H) 1.74(H)  Sodium 135 - 145 mmol/L 143 139 141  Potassium 3.5 - 5.1 mmol/L 5.1 4.2 4.1  Chloride 98 - 111 mmol/L 110 106 107(H)  CO2 20 - 29 mmol/L - 20 21  Calcium 8.6 - 10.2 mg/dL - 9.2 9.8  Total Protein 6.0 - 8.5 g/dL - - 7.1  Total Bilirubin 0.0 - 1.2 mg/dL - - 0.3  Alkaline Phos 39 - 117 IU/L - - 83  AST 0 - 40 IU/L - - 16  ALT 0 - 44 IU/L - - 16   CBC Latest Ref Rng & Units  01/03/2020 12/19/2019 12/23/2018  WBC 3.4 - 10.8 x10E3/uL - 9.1 8.5  Hemoglobin 13.0 - 17.0 g/dL 12.9(L) 13.2 9.2(L)  Hematocrit 39.0 - 52.0 % 38.0(L) 41.8 28.1(L)  Platelets 150 - 450 x10E3/uL - 203 162   Lipid Panel  No results found for: CHOL, TRIG, HDL, CHOLHDL, VLDL, LDLCALC, LDLDIRECT HEMOGLOBIN A1C Lab Results  Component Value Date   HGBA1C 8.0 (H) 10/08/2017   MPG 182.9 10/08/2017   TSH No results for input(s): TSH in the last 8760 hours.   External labs:  Cholesterol, total 155.000 M 03/14/2019 HDL 61.000 M 03/14/2019 LDL 105 NHDL 94 Triglycerides 67.000 M 03/14/2019   A1C 7.100 % 06/08/2019; TSH 1.530 03/14/2019  Hemoglobin 12.600 G/ 07/04/2019; INR 1.300 12/14/2018 Platelets 198.000 X 07/04/2019  Creatinine, Serum 1.700 MG/ 07/04/2019 Potassium 4.100 03/21/2019 Magnesium 2.000 MG/ 09/17/2016 ALT (SGPT) 16.000 03/21/2019  BNP 46.800 PG 10/13/2016  PRN Meds:. Medications Discontinued During This Encounter  Medication Reason  . DULoxetine HCl 40 MG CPEP Discontinued by provider  . isosorbide mononitrate (IMDUR) 60 MG 24 hr tablet Reorder   Current Meds  Medication Sig  . acetaminophen (TYLENOL) 500 MG tablet Take 1,000 mg by mouth every 4 (four) hours as needed for mild pain.   Marland Kitchen AMITIZA 24 MCG capsule Take 24 mcg by mouth as needed for constipation.   Marland Kitchen aspirin EC 81 MG tablet Take 81 mg by mouth daily.  .  chlorthalidone (HYGROTON) 50 MG tablet Take 25 mg by mouth daily.   . clonazePAM (KLONOPIN) 0.5 MG tablet Take 0.5 mg by mouth daily as needed for anxiety.  Marland Kitchen ezetimibe (ZETIA) 10 MG tablet Take 1 tablet (10 mg total) by mouth daily after supper.  . isosorbide mononitrate (IMDUR) 60 MG 24 hr tablet Take 1 tablet (60 mg total) by mouth daily.  Marland Kitchen JANUVIA 100 MG tablet Take 100 mg by mouth daily.   Marland Kitchen LANTUS SOLOSTAR 100 UNIT/ML Solostar Pen Inject 15-50 Units into the skin See admin instructions. Inject 50 units in the morning and 15 units in the evening  . metoprolol (LOPRESSOR) 50 MG tablet Take 25 mg by mouth 2 (two) times daily.  Marland Kitchen omeprazole (PRILOSEC) 40 MG capsule Take 40 mg by mouth daily.   . rosuvastatin (CRESTOR) 20 MG tablet Take 1 tablet (20 mg total) by mouth daily.  . tamsulosin (FLOMAX) 0.4 MG CAPS capsule Take 0.4 mg by mouth daily.  . vitamin B-12 (CYANOCOBALAMIN) 1000 MCG tablet Take 1,000 mcg by mouth daily.  . vitamin B-6 (PYRIDOXINE) 25 MG tablet Take 25 mg by mouth daily.  . [DISCONTINUED] isosorbide mononitrate (IMDUR) 60 MG 24 hr tablet Take 1 tablet by mouth once daily   Medication and allergies    Allergies  Allergen Reactions  . Contrast Media [Iodinated Diagnostic Agents] Itching    PT STATES ALLERGY TO "CT DYE".  [name] [Red Dye] Swelling    CAT scan dye  . Ibuprofen Other (See Comments)    Upset stomach    Current Outpatient Medications  Medication Instructions  . ACCU-CHEK SMARTVIEW test strip No dose, route, or frequency recorded.  Marland Kitchen acetaminophen (TYLENOL) 1,000 mg, Oral, Every 4 hours PRN  . Amitiza 24 mcg, Oral, As needed  . aspirin EC 81 mg, Oral, Daily  . chlorthalidone (HYGROTON) 25 mg, Oral, Daily  . clonazePAM (KLONOPIN) 0.5 mg, Oral, Daily PRN  . ezetimibe (ZETIA) 10 mg, Oral, Daily after supper  . isosorbide mononitrate (IMDUR) 60 mg, Oral, Daily  . Januvia  100 mg, Oral, Daily  . Lantus SoloStar 15-50 Units, Subcutaneous, See admin  instructions, Inject 50 units in the morning and 15 units in the evening  . metoprolol tartrate (LOPRESSOR) 25 mg, Oral, 2 times daily  . omeprazole (PRILOSEC) 40 mg, Oral, Daily  . rosuvastatin (CRESTOR) 20 mg, Oral, Daily  . tamsulosin (FLOMAX) 0.4 mg, Oral, Daily  . vitamin B-12 (CYANOCOBALAMIN) 1,000 mcg, Oral, Daily  . vitamin B-6 (PYRIDOXINE) 25 mg, Oral, Daily   Radiology:   No results found.   Cardiac Studies:   Lexiscan myoview stress test 04/17/2016: 1. Resting EKG demonstrates normal sinus rhythm, left axis deviation, right bundle branch block. Stress EKG is nondiagnostic for ischemia as a pharmacologic stress test. There are frequent PACs during the stress test. Stress symptoms included dyspnea. 2. The Perfusion images reveal the left ventricle to be mildly dilated at 132 mL both in rest and stress images. There is a moderate-sized inferior and inferoapical scar with very mild peri-infarct ischemia noted especially towards the apex. Left ventricle systolic function was moderate to severely depressed at 36%. 3. This is an intermediate risk study, clinical correlation recommended.    Coronary angiogram 11/04/2016: Native RCA small and diffusely diseased with distal 70-80% stenosis. Occluded SVG to RCA. Patent SVG to OM-RI, LIMA to LAD. Medical therapy.  Echo- 12/24/2017 1. Left ventricle cavity is normal in size. Moderate concentric hypertrophy of the left ventricle. Normal global echocardiogram wall motion. Doppler evidence of grade I (impaired) diastolic dysfunction. Calculated EF 54%. 2. Left atrial cavity is mildly dilated. 3. Trileaflet aortic valve. Mild aortic valve leaflet calcification. 4. Mild to moderate mitral regurgitation. Mild calcification function of the mitral valve annulus. Mild mitral valve leaflet patient later uptitrated to calcification. 5. Trace tricuspid regurgitation.   ABI 05/02/2019: No hemodynamically significant stenoses are identified in the  bilateral lower extremity arterial system.   Non-compressible ABI bilateral, suggestive of medial calcinosis. Moderately abnormal waveform right and mildly abnormal waveform left lower extremity at the level of the ankle. Study suggests small vessel disease.  Carotid artery duplex 10/11/2019:  Stenosis in the right internal carotid artery (50-69%). The right PSV  internal/common carotid artery ratio of 4.88 is consistent with a stenosis  of >70%. Stenosis in the right external carotid artery (<50%).  Stenosis in the left internal carotid artery (50-69%).  Stenosis in the left external carotid artery (<50%).  Peak velocity in the right ICA is 152/24 cm/s and left 203/27 cm/s. Antegrade right vertebral artery flow. Antegrade left vertebral artery  flow.  No significant change since 05/18/2019. Follow up in six months is  appropriate if clinically indicated.  Lower Extremity Arterial Duplex 11/30/2019:  No hemodynamically significant stenosis in bilateral lower extremity above  the knee.  Moderate velocity increase at the right mid posterior tibial artery.  suggests >50%stenosis.  Diffuse small vessel disease with moderately abnormal waveform right ankle  and severely abnormal waveform left ankle.  Non compressible vessels bilateral at the ankles suggests medial  calcinosis.   No significant change from 05/02/2019.  Peripheral arteriogram 01/03/2020: Distal abdominal aortogram with bifemoral arteriogram reveals widely patent iliac vessels and common femoral vessels.  Left SFA and left popliteal artery are widely patent with diffuse arterial sclerosis.  No luminal irregularity.  Below the left knee, two-vessel runoff in the form of PT and peroneal artery.  There is diffuse disease noted in both the peroneal and PT.  AT is occluded. Similarly on the right no significant disease in the SFA and  there is probably a two-vessel runoff in the form of peroneal artery and PT.  AT is  occluded. Recommendation: Patient has very slow flow suggestive of microvascular disease and also small vessel disease.  Unless critical limb ischemia no reason for angioplasty, continue medical therapy.  50 mill contrast utilized.  EKG:  EKG 11/14/2019: Normal sinus rhythm at 74 bpm with 1 PAC, left axis deviation, left anterior fasicular block, RBBB. Nonspecific T wave abnormality.  Assessment:     ICD-10-CM   1. Claudication in peripheral vascular disease (HCC)  I73.9 isosorbide mononitrate (IMDUR) 60 MG 24 hr tablet  2. Primary hypertension  I10   3. Orthostatic hypotension  I95.1   4. CKD stage 3 due to type 2 diabetes mellitus (HCC)  E11.22    N18.30   5. Hypercholesteremia  E78.00 Lipid Panel With LDL/HDL Ratio    Lipid Panel With LDL/HDL Ratio  6. Atherosclerosis of native coronary artery of native heart without angina pectoris  I25.10 isosorbide mononitrate (IMDUR) 60 MG 24 hr tablet   Recommendations:   [name]  is a 86 y.o. male with history of CAD, S/P CABG in 2009, PAD, type II diabetes with autonomic insufficiency and autonomic orthostatic hypotension, asymptomatic carotid stenosis, and hyperlipidemia.  He has patent graft except occluded SVG to RCA which is diffusely diseased small vessel and also severe below-knee bilateral lower disease by angiography on 11/04/2016 and repeat peripheral arteriogram on 01/03/20 and has severe slow flow suggestive of microvascular disease.   He is now tolerating Zetia that was added previously, he will need lipid profile testing. Ordered labs.  Due to severe slow flow noted during peripheral angiography, it may be worthwhile to try Imdur daily to see with a symptoms of claudication would improve. This was started after angiogram and will continue this.  No complications from the right groin access.  I do not want to be too aggressive with hypertension treatment in view of orthostatic hypotension.  Otherwise stable from cardiac standpoint,  I will see him back in 6 months.   [name], MD, [facility] 01/23/2020, 2:02 PM [facility]. [place] Office: [phone]

## 2020-01-23 ENCOUNTER — Other Ambulatory Visit: Payer: Self-pay

## 2020-01-23 ENCOUNTER — Ambulatory Visit (INDEPENDENT_AMBULATORY_CARE_PROVIDER_SITE_OTHER): Payer: Medicare HMO | Admitting: Cardiology

## 2020-01-23 ENCOUNTER — Encounter: Payer: Self-pay | Admitting: Cardiology

## 2020-01-23 VITALS — BP 142/70 | HR 63 | Temp 98.2°F | Resp 17 | Ht 67.0 in | Wt 186.0 lb

## 2020-01-23 DIAGNOSIS — N183 Chronic kidney disease, stage 3 unspecified: Secondary | ICD-10-CM | POA: Diagnosis not present

## 2020-01-23 DIAGNOSIS — I2510 Atherosclerotic heart disease of native coronary artery without angina pectoris: Secondary | ICD-10-CM

## 2020-01-23 DIAGNOSIS — E7800 Pure hypercholesterolemia, unspecified: Secondary | ICD-10-CM

## 2020-01-23 DIAGNOSIS — I951 Orthostatic hypotension: Secondary | ICD-10-CM

## 2020-01-23 DIAGNOSIS — I10 Essential (primary) hypertension: Secondary | ICD-10-CM

## 2020-01-23 DIAGNOSIS — E1122 Type 2 diabetes mellitus with diabetic chronic kidney disease: Secondary | ICD-10-CM

## 2020-01-23 DIAGNOSIS — I739 Peripheral vascular disease, unspecified: Secondary | ICD-10-CM | POA: Diagnosis not present

## 2020-01-23 MED ORDER — ISOSORBIDE MONONITRATE ER 60 MG PO TB24: 60.0000 mg | ORAL_TABLET | Freq: Every day | ORAL | 1 refills | Status: DC

## 2020-01-23 NOTE — Patient Instructions (Signed)
Please get labs done at the end of June 2021

## 2020-02-07 NOTE — Progress Notes (Signed)
[facility] Note  02/10/2020     CHIEF COMPLAINT Patient presents for Retina Follow Up   HISTORY OF PRESENT ILLNESS: Justin [name] is a 86 y.o. male who presents to the clinic today for:   HPI    Retina Follow Up    Patient presents with  Other.  In both eyes.  This started 5 weeks ago.  Severity is moderate.  I, the attending physician,  performed the HPI with the patient and updated documentation appropriately.          Comments    Patient here for 5 weeks retina follow up for DFE. Patient states vision about the same. No eye pain.        Last edited by [name], MD on 02/14/2020 12:30 AM. (History)    Pt states no changes in vision OU   Referring physician: [name], MD [address] [place],   [id number]  HISTORICAL INFORMATION:   Selected notes from the [facility] Referred by Dr. [name] for concern of mac edema OU LEE:  Ocular Hx-pseudo OU; formerly followed at [facility]) Dr. [name]. S/p focal laser OS x2, history of IVK and IVT OU PMH-anxiety, arthritis, CAD, DM (taking lantus and januvia), HTN, high cholesterol, prostate cancer    CURRENT MEDICATIONS: No current outpatient medications on file. (Ophthalmic Drugs)   No current facility-administered medications for this visit. (Ophthalmic Drugs)   Current Outpatient Medications (Other)  Medication Sig  . ACCU-CHEK SMARTVIEW test strip   . acetaminophen (TYLENOL) 500 MG tablet Take 1,000 mg by mouth every 4 (four) hours as needed for mild pain.   Marland Kitchen AMITIZA 24 MCG capsule Take 24 mcg by mouth as needed for constipation.   Marland Kitchen aspirin EC 81 MG tablet Take 81 mg by mouth daily.  . chlorthalidone (HYGROTON) 50 MG tablet Take 25 mg by mouth daily.   . clonazePAM (KLONOPIN) 0.5 MG tablet Take 0.5 mg by mouth daily as needed for anxiety.  Marland Kitchen ezetimibe (ZETIA) 10 MG tablet Take 1 tablet (10 mg total) by mouth daily  after supper.  . isosorbide mononitrate (IMDUR) 60 MG 24 hr tablet Take 1 tablet (60 mg total) by mouth daily.  Marland Kitchen JANUVIA 100 MG tablet Take 100 mg by mouth daily.   Marland Kitchen LANTUS SOLOSTAR 100 UNIT/ML Solostar Pen Inject 15-50 Units into the skin See admin instructions. Inject 50 units in the morning and 15 units in the evening  . metoprolol (LOPRESSOR) 50 MG tablet Take 25 mg by mouth 2 (two) times daily.  Marland Kitchen omeprazole (PRILOSEC) 40 MG capsule Take 40 mg by mouth daily.   . rosuvastatin (CRESTOR) 20 MG tablet Take 1 tablet (20 mg total) by mouth daily.  . tamsulosin (FLOMAX) 0.4 MG CAPS capsule Take 0.4 mg by mouth daily.  . vitamin B-12 (CYANOCOBALAMIN) 1000 MCG tablet Take 1,000 mcg by mouth daily.  . vitamin B-6 (PYRIDOXINE) 25 MG tablet Take 25 mg by mouth daily.   No current facility-administered medications for this visit. (Other)      REVIEW OF SYSTEMS: ROS    Positive for: Gastrointestinal, Genitourinary, Endocrine, Cardiovascular, Eyes   Negative for: Constitutional, Neurological, Skin, Musculoskeletal, HENT, Respiratory, Psychiatric, Allergic/Imm, Heme/Lymph   Last edited by [name], COA on 02/10/2020  9:59 AM. (History)       ALLERGIES Allergies  Allergen Reactions  . Contrast Media [Iodinated Diagnostic Agents] Itching    PT STATES  ALLERGY TO "CT DYE".  [name] [Red Dye] Swelling    CAT scan dye  . Ibuprofen Other (See Comments)    Upset stomach     PAST MEDICAL HISTORY Past Medical History:  Diagnosis Date  . Anxiety   . Arthritis   . Coronary artery disease   . Diabetes mellitus without complication ([place])   . Diabetic retinopathy ([place])    NPDR OU  . Diverticulitis   . Dyspnea   . GERD (gastroesophageal reflux disease)   . Hypercholesteremia   . Hypertension   . Hypertensive retinopathy    OU  . Pneumonia    as a child  . Prostate cancer ([place])   . UTI (lower urinary tract infection)    Past Surgical History:  Procedure Laterality Date   . ABDOMINAL SURGERY     for diverticulitis; also removed appendix  . CATARACT EXTRACTION    . CATARACT EXTRACTION, BILATERAL    . CHOLECYSTECTOMY N/A 10/12/2017   Procedure: LAPAROSCOPIC CHOLECYSTECTOMY;  Surgeon: [name], MD;  Location: [facility];  Service: General;  Laterality: N/A;  . CORONARY ARTERY BYPASS GRAFT  2009  . ERCP N/A 12/21/2018   Procedure: ENDOSCOPIC RETROGRADE CHOLANGIOPANCREATOGRAPHY (ERCP);  Surgeon: [name], MD;  Location: [facility];  Service: Endoscopy;  Laterality: N/A;  . ESOPHAGOGASTRODUODENOSCOPY (EGD) WITH PROPOFOL N/A 12/17/2018   Procedure: ESOPHAGOGASTRODUODENOSCOPY (EGD) WITH PROPOFOL;  Surgeon: [name], MD;  Location: [facility];  Service: Endoscopy;  Laterality: N/A;  . EUS Left 12/17/2018   Procedure: UPPER ENDOSCOPIC ULTRASOUND (EUS) LINEAR;  Surgeon: [name], MD;  Location: [facility];  Service: Endoscopy;  Laterality: Left;  . EYE SURGERY     "for bleeding in eye"  . HERNIA REPAIR    . LEFT HEART CATH AND CORONARY ANGIOGRAPHY N/A 11/04/2016   Procedure: Left Heart Cath and Coronary Angiography;  Surgeon: [name], MD;  Location: [facility];  Service: Cardiovascular;  Laterality: N/A;  . LOWER EXTREMITY ANGIOGRAPHY N/A 11/04/2016   Procedure: Lower Extremity Angiography;  Surgeon: [name], MD;  Location: [facility];  Service: Cardiovascular;  Laterality: N/A;  . LOWER EXTREMITY ANGIOGRAPHY N/A 01/03/2020   Procedure: LOWER EXTREMITY ANGIOGRAPHY;  Surgeon: [name], MD;  Location: [facility];  Service: Cardiovascular;  Laterality: N/A;  . PROSTATE SURGERY    . REMOVAL OF STONES  12/21/2018   Procedure: REMOVAL OF STONES;  Surgeon: [name], MD;  Location: [facility];  Service: Endoscopy;;  . [name]  12/21/2018   Procedure: [name];  Surgeon: [name], MD;  Location: [facility] ENDOSCOPY;  Service: Endoscopy;;    FAMILY HISTORY Family History  Problem Relation Age of Onset  .  Diabetes Father   . Diabetes Maternal Aunt   . Diabetes Maternal Uncle   . Diabetes Maternal Grandmother   . Heart disease Sister   . Diabetes Brother   . Heart disease Sister     SOCIAL HISTORY Social History   Tobacco Use  . Smoking status: Former Smoker    Packs/day: 0.25    Years: 2.00    Pack years: 0.50    Types: Cigarettes  . Smokeless tobacco: Never Used  . Tobacco comment: when he was a teenage age [age]  Substance Use Topics  . Alcohol use: No  . Drug use: No         OPHTHALMIC EXAM:  Base Eye Exam    Visual Acuity (Snellen - Linear)      Right Left  Dist cc 20/30 -2 20/40 +2   Dist ph cc NI 20/30   Correction: Glasses       Tonometry (Tonopen, 9:56 AM)      Right Left   Pressure 16 12       Pupils      Dark Light Shape React APD   Right 3 2 Round Brisk None   Left 3 2 Round Brisk None       Visual Fields (Counting fingers)      Left Right    Full Full       Extraocular Movement      Right Left    Full, Ortho Full, Ortho       Neuro/Psych    Oriented x3: Yes   Mood/Affect: Normal       Dilation    Both eyes: 1.0% Mydriacyl, 2.5% Phenylephrine @ 9:56 AM        Slit Lamp and Fundus Exam    Slit Lamp Exam      Right Left   Lids/Lashes Dermatochalasis - upper lid, Meibomian gland dysfunction Dermatochalasis - upper lid, Meibomian gland dysfunction   Conjunctiva/Sclera Melanosis Melanosis   Cornea Arcus, 1+ Punctate epithelial erosions Arcus, 1+ Punctate epithelial erosions   Anterior Chamber Deep and quiet Deep and quiet   Iris Mild Temporal Iris atrophy, Round and dilated, No NVI Mild Temporal Iris atrophy, Round and dilated, No NVI   Lens PC IOL in good position with mild aterior capsule fimosis PC IOL in good position with mild aterior capsule fimosis   Vitreous Vitreous syneresis, Posterior vitreous detachment, vitreous condensations Vitreous syneresis, Posterior vitreous detachment, vitreous condensations       Fundus Exam       Right Left   Disc pallor with sharp rim, Peripapillary atrophy, Compact mild pallor, sharp rim, Tilted disc, mild temporal Temporal Peripapillary atrophy, Compact   C/D Ratio 0.2 0.3   Macula Blunted foveal reflex, Retinal pigment epithelial mottling and clumping, Microaneurysms -- improved, persistent central cystic changes  Blunted foveal reflex, focal area of RPE atrophy SN to fovea, Microaneurysms, Retinal pigment epithelial mottling, refractile Epiretinal membrane,  central cystic changes persistent   Vessels Vascular attenuation, Tortuous Tortuous, Vascular attenuation   Periphery Attached, scattered MA, good peripheral PRP 360 - room for posterior fill in if needed Attached, scattered IRH, Peripheral PRP 360.        Refraction    Wearing Rx      Sphere Cylinder Axis Add   Right +0.25 +0.50 010 +3.00   Left [place] +0.75 173 +3.00   Type: Bifocal          IMAGING AND PROCEDURES  Imaging and Procedures for _0 @  OCT, Retina - OU - Both Eyes       Right Eye Quality was good. Central Foveal Thickness: 370. Progression has improved. Findings include abnormal foveal contour, intraretinal fluid, no SRF, retinal drusen , outer retinal atrophy (Mild interval improvement in cystic changes).   Left Eye Quality was good. Central Foveal Thickness: 263. Progression has improved. Findings include abnormal foveal contour, no SRF, intraretinal fluid, outer retinal atrophy, retinal drusen  (Mild interval improvement in IRF ).   Notes *Images captured and stored on drive  Diagnosis / Impression:  DME OU, OD>OS OD: mild interval improvement in  IRF  OS: Mild interval improvement in IRF   Clinical management:  See below  Abbreviations: NFP - Normal foveal profile. CME - cystoid macular edema. PED - pigment epithelial detachment. IRF -  intraretinal fluid. SRF - subretinal fluid. EZ - ellipsoid zone. ERM - epiretinal membrane. ORA - outer retinal atrophy. ORT - outer retinal  tubulation. SRHM - subretinal hyper-reflective material         Intravitreal Injection, Pharmacologic Agent - OD - Right Eye       Time Out 02/10/2020. 9:36 AM. Confirmed correct patient, procedure, site, and patient consented.   Anesthesia Topical anesthesia was used. Anesthetic medications included Lidocaine 2%, Proparacaine 0.5%.   Procedure Preparation included 5% betadine to ocular surface, eyelid speculum. A supplied (33g) needle was used.   Injection:  2 mg aflibercept Alfonse Flavors) SOLN   NDC: A3590391, Lot: [id number], Expiration date: 06/25/2020   Route: Intravitreal, Site: Right Eye, Waste: 0.05 mL  Post-op Post injection exam found visual acuity of at least counting fingers. The patient tolerated the procedure well. There were no complications. The patient received written and verbal post procedure care education.        Intravitreal Injection, Pharmacologic Agent - OS - Left Eye       Time Out 02/10/2020. 9:37 AM. Confirmed correct patient, procedure, site, and patient consented.   Anesthesia Topical anesthesia was used. Anesthetic medications included Lidocaine 2%, Proparacaine 0.5%.   Procedure Preparation included 5% betadine to ocular surface, eyelid speculum. A (33g) needle was used.   Injection:  2 mg aflibercept Alfonse Flavors) SOLN   NDC: A3590391, Lot: [id number], Expiration date: 06/25/2020   Route: Intravitreal, Site: Left Eye, Waste: 0 mL  Post-op Post injection exam found visual acuity of at least counting fingers. The patient tolerated the procedure well. There were no complications. The patient received written and verbal post procedure care education.                 ASSESSMENT/PLAN:    ICD-10-CM   1. Moderate nonproliferative diabetic retinopathy of both eyes with macular edema associated with type 2 diabetes mellitus (HCC)  Y70.6237 Intravitreal Injection, Pharmacologic Agent - OD - Right Eye    Intravitreal Injection, Pharmacologic  Agent - OS - Left Eye    aflibercept (EYLEA) SOLN 2 mg    aflibercept (EYLEA) SOLN 2 mg  2. Retinal edema  H35.81 OCT, Retina - OU - Both Eyes  3. Essential hypertension  I10   4. Hypertensive retinopathy of both eyes  H35.033   5. Pseudophakia of both eyes  Z96.1     1,2. Moderate non-proliferative diabetic retinopathy with edema OU   - moved to [place] from [place], [place] -- history of prior injections OS with Dr. [name]  - records received and reviewed from [facility] [place] (Dr. [name])  - history of focal laser OS x2 and IVK/IVT OU in [place] -- last injection was IVK OS November 2013  - initial exam: scattered IRH, DBH and +macular edema  - initial OCT: diabetic macular edema, both eyes, (OD > OS)  - FA (10.1.19) showed late leaking MA OU -- no NV  - delayed f/u on 4.16.20 due to hospitalization for sepsis / gallstones  - S/P PRP OD (07.14.20) - good laser surrounding, room for posterior fill in if needed  - S/P PRP OS (08.26.20)  - S/P IVA OD #1 (09.17.19), #2 (10.29.19), #3 (11.26.19), #4 (01.02.20), #5 (01.30.20), #6 (02.28.20), #7 (04.16.20), #8 (06.11.20), #9 (08.12.20), #10 (01.11.21)  - S/P IVA OS #1 (10.01.19), #2 (10.29.19), #3 (11.26.19), #4 (01.02.20), #5 (01.30.20), #6 (02.28.20), #7 (04.16.20), #8 (05.14.20), #9 (06.11.20), #10 (07.09.20), #11 (08.12.20), #12 (01.11.21)  - S/P IVK OD #1 (05.14.20) --  no significant improvement  - switched back to Avastin OD (#9 6.11.2020)  - repeated Eylea4U benefits investigation due to possible IVA resistance (8.12.20)  - S/P IVE OS #1 (09.16.20), #2 (10.16.20), #3 (11.13.20), #4 (12.11.20), #5 (02.08.21), #6 (03.12.21), #7 (04.16.21)  - S/P IVE OD #1 (09.16.20), #2 (10.16.20), #3 (11.13.20), #4 (12.11.20), #5 (02.08.21), #6 (03.12.21), #7 (04.16.21)  - OCT today shows mild interval improvement in IRF OU  - BCVA: OD stable at 20/30+2 and OS stable at 20/30   - recommend IVE OU #8 today, 05.14.21 - Eylea4U benefits  investigation completed and verified for 2021 as of 02.08.21  - pt wishes to proceed  - RBA of procedure discussed, questions answered  - informed consent obtained  - see procedure note  - Eylea informed consent form signed and scanned on 12.11.2020  - f/u 5 weeks, DFE, OCT, possible injection  3,4. Hypertensive retinopathy OU  - discussed importance of tight BP control  - monitor  5. Pseudophakia OU  - s/p CE/IOL OU in [place], [place]  - beautiful surgeries, doing well  - monitor   Ophthalmic Meds Ordered this visit:  Meds ordered this encounter  Medications  . aflibercept (EYLEA) SOLN 2 mg  . aflibercept (EYLEA) SOLN 2 mg       Return in about 5 weeks (around 03/16/2020) for 5 weeks NPDR OU/DFE/OCT.  There are no Patient Instructions on file for this visit.   Explained the diagnoses, plan, and follow up with the patient and they expressed understanding.  Patient expressed understanding of the importance of proper follow up care.   This document serves as a record of services personally performed by Gardiner Sleeper, MD, PhD. It was created on their behalf by Ernest Mallick, OA, an ophthalmic assistant. The creation of this record is the provider's dictation and/or activities during the visit.    Electronically signed by: Ernest Mallick, OA 05.11.2021 12:35 AM   Gardiner Sleeper, M.D., Ph.D. Diseases & Surgery of the Retina and Vitreous [facility]  I have reviewed the above documentation for accuracy and completeness, and I agree with the above. Gardiner Sleeper, M.D., Ph.D. 02/14/20 12:35 AM   Abbreviations: M myopia (nearsighted); A astigmatism; H hyperopia (farsighted); P presbyopia; Mrx spectacle prescription;  CTL contact lenses; OD right eye; OS left eye; OU both eyes  XT exotropia; ET esotropia; PEK punctate epithelial keratitis; PEE punctate epithelial erosions; DES dry eye syndrome; MGD meibomian gland dysfunction; ATs artificial tears; PFAT's  preservative free artificial tears; [place] nuclear sclerotic cataract; PSC posterior subcapsular cataract; ERM epi-retinal membrane; PVD posterior vitreous detachment; RD retinal detachment; DM diabetes mellitus; DR diabetic retinopathy; NPDR non-proliferative diabetic retinopathy; PDR proliferative diabetic retinopathy; CSME clinically significant macular edema; DME diabetic macular edema; dbh dot blot hemorrhages; CWS cotton wool spot; POAG primary open angle glaucoma; C/D cup-to-disc ratio; HVF humphrey visual field; GVF goldmann visual field; OCT optical coherence tomography; IOP intraocular pressure; BRVO Branch retinal vein occlusion; CRVO central retinal vein occlusion; CRAO central retinal artery occlusion; BRAO branch retinal artery occlusion; RT retinal tear; SB scleral buckle; PPV pars plana vitrectomy; VH Vitreous hemorrhage; PRP panretinal laser photocoagulation; IVK intravitreal kenalog; VMT vitreomacular traction; MH Macular hole;  NVD neovascularization of the disc; NVE neovascularization elsewhere; AREDS age related eye disease study; ARMD age related macular degeneration; POAG primary open angle glaucoma; EBMD epithelial/anterior basement membrane dystrophy; ACIOL anterior chamber intraocular lens; IOL intraocular lens; PCIOL posterior chamber intraocular lens; Phaco/IOL phacoemulsification with intraocular  lens placement; [place] photorefractive keratectomy; LASIK laser assisted in situ keratomileusis; HTN hypertension; DM diabetes mellitus; COPD chronic obstructive pulmonary disease

## 2020-02-09 DIAGNOSIS — N189 Chronic kidney disease, unspecified: Secondary | ICD-10-CM | POA: Diagnosis not present

## 2020-02-09 DIAGNOSIS — I129 Hypertensive chronic kidney disease with stage 1 through stage 4 chronic kidney disease, or unspecified chronic kidney disease: Secondary | ICD-10-CM | POA: Diagnosis not present

## 2020-02-09 DIAGNOSIS — N183 Chronic kidney disease, stage 3 unspecified: Secondary | ICD-10-CM | POA: Diagnosis not present

## 2020-02-09 DIAGNOSIS — D631 Anemia in chronic kidney disease: Secondary | ICD-10-CM | POA: Diagnosis not present

## 2020-02-09 DIAGNOSIS — N2581 Secondary hyperparathyroidism of renal origin: Secondary | ICD-10-CM | POA: Diagnosis not present

## 2020-02-10 ENCOUNTER — Ambulatory Visit (INDEPENDENT_AMBULATORY_CARE_PROVIDER_SITE_OTHER): Payer: Medicare HMO | Admitting: Ophthalmology

## 2020-02-10 ENCOUNTER — Encounter (INDEPENDENT_AMBULATORY_CARE_PROVIDER_SITE_OTHER): Payer: Self-pay | Admitting: Ophthalmology

## 2020-02-10 ENCOUNTER — Other Ambulatory Visit: Payer: Self-pay

## 2020-02-10 DIAGNOSIS — E113313 Type 2 diabetes mellitus with moderate nonproliferative diabetic retinopathy with macular edema, bilateral: Secondary | ICD-10-CM

## 2020-02-10 DIAGNOSIS — Z961 Presence of intraocular lens: Secondary | ICD-10-CM

## 2020-02-10 DIAGNOSIS — H3581 Retinal edema: Secondary | ICD-10-CM | POA: Diagnosis not present

## 2020-02-10 DIAGNOSIS — H35033 Hypertensive retinopathy, bilateral: Secondary | ICD-10-CM

## 2020-02-10 DIAGNOSIS — I10 Essential (primary) hypertension: Secondary | ICD-10-CM

## 2020-02-14 DIAGNOSIS — I739 Peripheral vascular disease, unspecified: Secondary | ICD-10-CM | POA: Diagnosis not present

## 2020-02-14 DIAGNOSIS — M79671 Pain in right foot: Secondary | ICD-10-CM | POA: Diagnosis not present

## 2020-02-14 DIAGNOSIS — M2042 Other hammer toe(s) (acquired), left foot: Secondary | ICD-10-CM | POA: Diagnosis not present

## 2020-02-14 DIAGNOSIS — E1151 Type 2 diabetes mellitus with diabetic peripheral angiopathy without gangrene: Secondary | ICD-10-CM | POA: Diagnosis not present

## 2020-02-14 DIAGNOSIS — B351 Tinea unguium: Secondary | ICD-10-CM | POA: Diagnosis not present

## 2020-02-14 DIAGNOSIS — M2041 Other hammer toe(s) (acquired), right foot: Secondary | ICD-10-CM | POA: Diagnosis not present

## 2020-02-14 DIAGNOSIS — L84 Corns and callosities: Secondary | ICD-10-CM | POA: Diagnosis not present

## 2020-02-14 MED ORDER — AFLIBERCEPT 2MG/0.05ML IZ SOLN FOR KALEIDOSCOPE
2.0000 mg | INTRAVITREAL | Status: AC | PRN
  Administered 2020-02-14: 2 mg via INTRAVITREAL

## 2020-02-22 ENCOUNTER — Other Ambulatory Visit: Payer: Self-pay | Admitting: Sports Medicine

## 2020-02-22 DIAGNOSIS — M545 Low back pain, unspecified: Secondary | ICD-10-CM

## 2020-02-22 DIAGNOSIS — M25552 Pain in left hip: Secondary | ICD-10-CM | POA: Diagnosis not present

## 2020-02-22 DIAGNOSIS — M25551 Pain in right hip: Secondary | ICD-10-CM | POA: Diagnosis not present

## 2020-03-02 DIAGNOSIS — E7800 Pure hypercholesterolemia, unspecified: Secondary | ICD-10-CM | POA: Diagnosis not present

## 2020-03-03 LAB — LIPID PANEL WITH LDL/HDL RATIO
Cholesterol, Total: 162 mg/dL (ref 100–199)
HDL: 63 mg/dL (ref >39)
LDL Chol Calc (NIH): 86 mg/dL (ref 0–99)
LDL/HDL Ratio: 1.4 ratio (ref 0.0–3.6)
Triglycerides: 66 mg/dL (ref 0–149)
VLDL Cholesterol Cal: 13 mg/dL (ref 5–40)

## 2020-03-06 NOTE — Progress Notes (Signed)
Cholesterol, total 155.000 M 03/14/2019 HDL 61.000 M 03/14/2019 LDL 105 NHDL 94 Triglycerides 67.000 M 03/14/2019   Lipids improved but still may need to address as LDL goal is <70.

## 2020-03-15 DIAGNOSIS — M79671 Pain in right foot: Secondary | ICD-10-CM | POA: Diagnosis not present

## 2020-03-15 DIAGNOSIS — E1151 Type 2 diabetes mellitus with diabetic peripheral angiopathy without gangrene: Secondary | ICD-10-CM | POA: Diagnosis not present

## 2020-03-15 DIAGNOSIS — M79672 Pain in left foot: Secondary | ICD-10-CM | POA: Diagnosis not present

## 2020-03-15 DIAGNOSIS — M792 Neuralgia and neuritis, unspecified: Secondary | ICD-10-CM | POA: Diagnosis not present

## 2020-03-15 NOTE — Progress Notes (Signed)
[facility] Note  03/16/2020     CHIEF COMPLAINT Patient presents for Retina Follow Up   HISTORY OF PRESENT ILLNESS: Justin [name] is a 86 y.o. male who presents to the clinic today for:   HPI    Retina Follow Up    Patient presents with  Diabetic Retinopathy.  In both eyes.  This started months ago.  Severity is moderate.  Duration of 5 weeks.  Since onset it is stable.  I, the attending physician,  performed the HPI with the patient and updated documentation appropriately.          Comments    86 y/o male pt here for 5 wk f/u for mod NPDR w/mac edema OU.  Feels VA OU may be slightly worse.  Denies pain, FOL.  Has floaters OU, and eyes tear a lot.  No gtts.  BS 135 this a.m.  A1C 7.1 2 wks ago.       Last edited by [name], MD on 03/18/2020  1:50 AM. (History)    Pt states no changes in vision OU   Referring physician: [name], MD [address] [place],  [place] [address]  HISTORICAL INFORMATION:   Selected notes from the [facility] Referred by Dr. [name] for concern of mac edema OU    CURRENT MEDICATIONS: No current outpatient medications on file. (Ophthalmic Drugs)   No current facility-administered medications for this visit. (Ophthalmic Drugs)   Current Outpatient Medications (Other)  Medication Sig  . ACCU-CHEK SMARTVIEW test strip   . acetaminophen (TYLENOL) 500 MG tablet Take 1,000 mg by mouth every 4 (four) hours as needed for mild pain.   Marland Kitchen AMITIZA 24 MCG capsule Take 24 mcg by mouth as needed for constipation.   Marland Kitchen aspirin EC 81 MG tablet Take 81 mg by mouth daily.  . chlorthalidone (HYGROTON) 50 MG tablet Take 25 mg by mouth daily.   . clobetasol cream (TEMOVATE) 0.05 %   . clonazePAM (KLONOPIN) 0.5 MG tablet Take 0.5 mg by mouth daily as needed for anxiety.  Marland Kitchen ezetimibe (ZETIA) 10 MG tablet Take 1 tablet (10 mg total) by mouth daily after supper.  . gabapentin (NEURONTIN) 300 MG  capsule   . isosorbide mononitrate (IMDUR) 60 MG 24 hr tablet Take 1 tablet (60 mg total) by mouth daily.  Marland Kitchen JANUVIA 100 MG tablet Take 100 mg by mouth daily.   Marland Kitchen LANTUS SOLOSTAR 100 UNIT/ML Solostar Pen Inject 15-50 Units into the skin See admin instructions. Inject 50 units in the morning and 15 units in the evening  . methylPREDNISolone acetate (DEPO-MEDROL) 40 MG/ML injection Inject 80 mg into the muscle once.  . metoprolol (LOPRESSOR) 50 MG tablet Take 25 mg by mouth 2 (two) times daily.  Marland Kitchen omeprazole (PRILOSEC) 40 MG capsule Take 40 mg by mouth daily.   . pantoprazole (PROTONIX) 40 MG tablet   . ramipril (ALTACE) 10 MG capsule   . rosuvastatin (CRESTOR) 20 MG tablet Take 1 tablet (20 mg total) by mouth daily.  . simvastatin (ZOCOR) 20 MG tablet   . tamsulosin (FLOMAX) 0.4 MG CAPS capsule Take 0.4 mg by mouth daily.  . vitamin B-12 (CYANOCOBALAMIN) 1000 MCG tablet Take 1,000 mcg by mouth daily.  . vitamin B-6 (PYRIDOXINE) 25 MG tablet Take 25 mg by mouth daily.   No current facility-administered medications for this visit. (Other)      REVIEW OF SYSTEMS: ROS    Positive  for: Gastrointestinal, Endocrine, Cardiovascular, Eyes   Negative for: Constitutional, Neurological, Skin, Genitourinary, Musculoskeletal, HENT, Respiratory, Psychiatric, Allergic/Imm, Heme/Lymph   Last edited by Matthew Folks, COA on 03/16/2020  1:38 PM. (History)       ALLERGIES Allergies  Allergen Reactions  . Contrast Media [Iodinated Diagnostic Agents] Itching    PT STATES ALLERGY TO "CT DYE".  [name] [Red Dye] Swelling    CAT scan dye  . Ibuprofen Other (See Comments)    Upset stomach     PAST MEDICAL HISTORY Past Medical History:  Diagnosis Date  . Anxiety   . Arthritis   . Coronary artery disease   . Diabetes mellitus without complication ([facility])   . Diabetic retinopathy ([facility])    NPDR OU  . Diverticulitis   . Dyspnea   . GERD (gastroesophageal reflux disease)   .  Hypercholesteremia   . Hypertension   . Hypertensive retinopathy    OU  . Pneumonia    as a child  . Prostate cancer ([facility])   . UTI (lower urinary tract infection)    Past Surgical History:  Procedure Laterality Date  . ABDOMINAL SURGERY     for diverticulitis; also removed appendix  . CATARACT EXTRACTION    . CATARACT EXTRACTION, BILATERAL    . CHOLECYSTECTOMY N/A 10/12/2017   Procedure: LAPAROSCOPIC CHOLECYSTECTOMY;  Surgeon: [name], MD;  Location: [facility];  Service: General;  Laterality: N/A;  . CORONARY ARTERY BYPASS GRAFT  2009  . ERCP N/A 12/21/2018   Procedure: ENDOSCOPIC RETROGRADE CHOLANGIOPANCREATOGRAPHY (ERCP);  Surgeon: [name], MD;  Location: [facility];  Service: Endoscopy;  Laterality: N/A;  . ESOPHAGOGASTRODUODENOSCOPY (EGD) WITH PROPOFOL N/A 12/17/2018   Procedure: ESOPHAGOGASTRODUODENOSCOPY (EGD) WITH PROPOFOL;  Surgeon: [name], MD;  Location: [facility];  Service: Endoscopy;  Laterality: N/A;  . EUS Left 12/17/2018   Procedure: UPPER ENDOSCOPIC ULTRASOUND (EUS) LINEAR;  Surgeon: [name], MD;  Location: [facility];  Service: Endoscopy;  Laterality: Left;  . EYE SURGERY     "for bleeding in eye"  . HERNIA REPAIR    . LEFT HEART CATH AND CORONARY ANGIOGRAPHY N/A 11/04/2016   Procedure: Left Heart Cath and Coronary Angiography;  Surgeon: [name], MD;  Location: [facility];  Service: Cardiovascular;  Laterality: N/A;  . LOWER EXTREMITY ANGIOGRAPHY N/A 11/04/2016   Procedure: Lower Extremity Angiography;  Surgeon: [name], MD;  Location: [facility];  Service: Cardiovascular;  Laterality: N/A;  . LOWER EXTREMITY ANGIOGRAPHY N/A 01/03/2020   Procedure: LOWER EXTREMITY ANGIOGRAPHY;  Surgeon: [name], MD;  Location: [facility];  Service: Cardiovascular;  Laterality: N/A;  . PROSTATE SURGERY    . REMOVAL OF STONES  12/21/2018   Procedure: REMOVAL OF STONES;  Surgeon: [name], MD;  Location: [facility];  Service:  Endoscopy;;  . [name]  12/21/2018   Procedure: [name];  Surgeon: [name], MD;  Location[facility];  Service: Endoscopy;;    FAMILY HISTORY Family History  Problem Relation Age of Onset  . Diabetes Father   . Diabetes Maternal Aunt   . Diabetes Maternal Uncle   . Diabetes Maternal Grandmother   . Heart disease Sister   . Diabetes Brother   . Heart disease Sister     SOCIAL HISTORY Social History   Tobacco Use  . Smoking status: Former Smoker    Packs/day: 0.25    Years: 2.00    Pack years: 0.50    Types: Cigarettes  .  Smokeless tobacco: Never Used  . Tobacco comment: when he was a teenage age [age]  Vaping Use  . Vaping Use: Never used  Substance Use Topics  . Alcohol use: No  . Drug use: No         OPHTHALMIC EXAM:  Base Eye Exam    Visual Acuity (Snellen - Linear)      Right Left   Dist cc 20/30 -2 20/50   Dist ph cc NI 20/30 -2   Correction: Glasses       Tonometry (Tonopen, 1:41 PM)      Right Left   Pressure 11 10       Pupils      Dark Light Shape React APD   Right 3 2 Round Brisk None   Left 3 2 Round Brisk None       Visual Fields (Counting fingers)      Left Right    Full Full       Extraocular Movement      Right Left    Full, Ortho Full, Ortho       Neuro/Psych    Oriented x3: Yes   Mood/Affect: Normal       Dilation    Both eyes: 1.0% Mydriacyl, 2.5% Phenylephrine @ 1:41 PM        Slit Lamp and Fundus Exam    Slit Lamp Exam      Right Left   Lids/Lashes Dermatochalasis - upper lid, Meibomian gland dysfunction Dermatochalasis - upper lid, Meibomian gland dysfunction   Conjunctiva/Sclera Melanosis Melanosis   Cornea Arcus, 1+ Punctate epithelial erosions Arcus, 1+ Punctate epithelial erosions   Anterior Chamber Deep and quiet Deep and quiet   Iris Mild Temporal Iris atrophy, Round and dilated, No NVI Mild Temporal Iris atrophy, Round and dilated, No NVI   Lens PC IOL in good position with mild  aterior capsule fimosis PC IOL in good position with mild aterior capsule fimosis   Vitreous Vitreous syneresis, Posterior vitreous detachment, vitreous condensations Vitreous syneresis, Posterior vitreous detachment, vitreous condensations       Fundus Exam      Right Left   Disc pallor with sharp rim, Peripapillary atrophy, Compact mild pallor, sharp rim, Tilted disc, mild temporal Temporal Peripapillary atrophy, Compact   C/D Ratio 0.2 0.3   Macula Blunted foveal reflex, Retinal pigment epithelial mottling and clumping, Microaneurysms -- improved, persistent central cystic changes  Blunted foveal reflex, focal area of RPE atrophy SN to fovea, Microaneurysms, Retinal pigment epithelial mottling, refractile Epiretinal membrane,  central cystic changes persistent/ slightly improved   Vessels Vascular attenuation, Tortuous Tortuous, Vascular attenuation   Periphery Attached, scattered MA, good peripheral PRP 360 - room for posterior fill in if needed Attached, scattered IRH, Peripheral PRP 360.          IMAGING AND PROCEDURES  Imaging and Procedures for _0 @  MR LUMBAR SPINE WO CONTRAST       CLINICAL DATA:  Low back pain with bilateral radiculopathy  EXAM: MRI LUMBAR SPINE WITHOUT CONTRAST  TECHNIQUE: Multiplanar, multisequence MR imaging of the lumbar spine was performed. No intravenous contrast was administered.  COMPARISON:  02/24/2018  FINDINGS: Segmentation:  Standard.  Alignment: Straightening of the lumbar lordosis without static listhesis.  Vertebrae: No fracture, evidence of discitis, or bone lesion. Multilevel anterior endplate osteophytosis.  Conus medullaris and cauda equina: Conus extends to the T12 level. Conus and cauda equina appear normal.  Paraspinal and other soft tissues: Negative.  Disc levels:  T12-L1: Small right paracentral disc protrusion without foraminal or canal stenosis. Unchanged.  L1-L2: Small-moderate right paracentral disc  protrusion with 8 mm of cranial migration of disc material. Mild bilateral facet arthropathy. No foraminal or canal stenosis. Unchanged.  L2-L3: Mild diffuse disc bulge with mild bilateral facet arthropathy. No foraminal or canal stenosis. Unchanged.  L3-L4: Mild diffuse disc bulge with mild bilateral facet arthropathy. No foraminal or canal stenosis. Unchanged.  L4-L5: Circumferential disc bulge, eccentric to the right with bilateral facet arthropathy and ligamentum flavum buckling. Findings result in severe canal and right lateral recess stenosis. Foramina remain patent. Unchanged.  L5-S1: Mild diffuse disc bulge with bilateral facet arthropathy. Mild left greater than right subarticular recess stenosis without canal stenosis. No foraminal stenosis. Findings have slightly progressed from prior.  IMPRESSION: 1. Multilevel degenerative changes of the lumbar spine, slightly progressed at the L5-S1 level where there is mild left greater than right subarticular recess stenosis. 2. Severe canal and right lateral recess stenosis at L4-L5, unchanged from prior.   Electronically Signed   By: Davina Poke D.O.   On: 03/16/2020 12:37        OCT, Retina - OU - Both Eyes       Right Eye Quality was good. Central Foveal Thickness: 370. Progression has improved. Findings include abnormal foveal contour, intraretinal fluid, no SRF, retinal drusen , outer retinal atrophy (Persistent non-central cystic changes, mild improvement).   Left Eye Quality was good. Central Foveal Thickness: 258. Progression has improved. Findings include abnormal foveal contour, no SRF, intraretinal fluid, outer retinal atrophy, retinal drusen  (Mild interval improvement in central cystic changes).   Notes *Images captured and stored on drive  Diagnosis / Impression:  DME OU, OD>OS OD: Persistent non-central cystic changes, mild improvement OS: Mild interval improvement in central cystic  changes  Clinical management:  See below  Abbreviations: NFP - Normal foveal profile. CME - cystoid macular edema. PED - pigment epithelial detachment. IRF - intraretinal fluid. SRF - subretinal fluid. EZ - ellipsoid zone. ERM - epiretinal membrane. ORA - outer retinal atrophy. ORT - outer retinal tubulation. SRHM - subretinal hyper-reflective material         Intravitreal Injection, Pharmacologic Agent - OD - Right Eye       Time Out 03/16/2020. 2:41 PM. Confirmed correct patient, procedure, site, and patient consented.   Anesthesia Topical anesthesia was used. Anesthetic medications included Lidocaine 2%, Proparacaine 0.5%.   Procedure Preparation included 5% betadine to ocular surface, eyelid speculum. A (32g) needle was used.   Injection:  2 mg aflibercept Alfonse Flavors) SOLN   NDC: A3590391, Lot: [id number], Expiration date: 06/25/2020   Route: Intravitreal, Site: Right Eye, Waste: 0.05 mL  Post-op Post injection exam found visual acuity of at least counting fingers. The patient tolerated the procedure well. There were no complications. The patient received written and verbal post procedure care education.        Intravitreal Injection, Pharmacologic Agent - OS - Left Eye       Time Out 03/16/2020. 2:42 PM. Confirmed correct patient, procedure, site, and patient consented.   Anesthesia Topical anesthesia was used. Anesthetic medications included Lidocaine 2%, Proparacaine 0.5%.   Procedure Preparation included 5% betadine to ocular surface, eyelid speculum. A (32g) needle was used.   Injection:  2 mg aflibercept Alfonse Flavors) SOLN   NDC: M7179715, Lot: [id number], Expiration date: 06/25/2020   Route: Intravitreal, Site: Left Eye, Waste: 0.05 mL  Post-op Post injection exam found visual acuity of at least counting  fingers. The patient tolerated the procedure well. There were no complications. The patient received written and verbal post procedure care education.                  ASSESSMENT/PLAN:    ICD-10-CM   1. Moderate nonproliferative diabetic retinopathy of both eyes with macular edema associated with type 2 diabetes mellitus (HCC)  I34.7425 Intravitreal Injection, Pharmacologic Agent - OD - Right Eye    Intravitreal Injection, Pharmacologic Agent - OS - Left Eye    aflibercept (EYLEA) SOLN 2 mg    aflibercept (EYLEA) SOLN 2 mg  2. Retinal edema  H35.81 OCT, Retina - OU - Both Eyes  3. Essential hypertension  I10   4. Hypertensive retinopathy of both eyes  H35.033   5. Pseudophakia of both eyes  Z96.1     1,2. Moderate non-proliferative diabetic retinopathy with edema OU   - moved to [place] from [place], [place] -- history of prior injections OS with Dr. [name]  - records received and reviewed from [facility] (Dr. [name])  - history of focal laser OS x2 and IVK/IVT OU in [place] -- last injection was IVK OS November 2013  - initial exam: scattered [place], DBH and +macular edema  - initial OCT: diabetic macular edema, both eyes, (OD > OS)  - FA (10.1.19) showed late leaking MA OU -- no NV  - delayed f/u on 4.16.20 due to hospitalization for sepsis / gallstones  - S/P PRP OD (07.14.20) - good laser surrounding, room for posterior fill in if needed  - S/P PRP OS (08.26.20)  - S/P IVA OD #1 (09.17.19), #2 (10.29.19), #3 (11.26.19), #4 (01.02.20), #5 (01.30.20), #6 (02.28.20), #7 (04.16.20), #8 (06.11.20), #9 (08.12.20), #10 (01.11.21)  - S/P IVA OS #1 (10.01.19), #2 (10.29.19), #3 (11.26.19), #4 (01.02.20), #5 (01.30.20), #6 (02.28.20), #7 (04.16.20), #8 (05.14.20), #9 (06.11.20), #10 (07.09.20), #11 (08.12.20), #12 (01.11.21)  - S/P IVK OD #1 (05.14.20) -- no significant improvement  - switched back to Avastin OD (#9 6.11.2020)  - repeated Eylea4U benefits investigation due to possible IVA resistance (8.12.20)  - S/P IVE OS #1 (09.16.20), #2 (10.16.20), #3 (11.13.20), #4 (12.11.20), #5 (02.08.21), #6 (03.12.21), #7 (04.16.21), #8  (05.14.21)  - S/P IVE OD #1 (09.16.20), #2 (10.16.20), #3 (11.13.20), #4 (12.11.20), #5 (02.08.21), #6 (03.12.21), #7 (04.16.21), #8 (05.14.21)  - OCT today shows mild interval improvement in cystic changes OU  - BCVA: OD stable at 20/30+2 and OS stable at 20/30   - recommend IVE OU #9 today, 06.18.21 - Eylea4U benefits investigation completed and verified for 2021 as of 02.08.21  - pt wishes to proceed  - RBA of procedure discussed, questions answered  - informed consent obtained  - see procedure note  - Eylea informed consent form signed and scanned on 12.11.2020  - f/u 5 weeks, DFE, OCT, possible injection  3,4. Hypertensive retinopathy OU  - discussed importance of tight BP control  - monitor  5. Pseudophakia OU  - s/p CE/IOL OU in [place], [place]  - beautiful surgeries, doing well  - monitor   Ophthalmic Meds Ordered this visit:  Meds ordered this encounter  Medications  . aflibercept (EYLEA) SOLN 2 mg  . aflibercept (EYLEA) SOLN 2 mg       Return in about 5 weeks (around 04/20/2020) for f/u NPDR OU, DFE, OCT.  There are no Patient Instructions on file for this visit.   Explained the diagnoses, plan, and follow up with the patient and  they expressed understanding.  Patient expressed understanding of the importance of proper follow up care.   This document serves as a record of services personally performed by Gardiner Sleeper, MD, PhD. It was created on their behalf by Ernest Mallick, OA, an ophthalmic assistant. The creation of this record is the provider's dictation and/or activities during the visit.    Electronically signed by: Ernest Mallick, OA 06.17.2021 1:53 AM   Gardiner Sleeper, M.D., Ph.D. Diseases & Surgery of the Retina and Vitreous [facility]  I have reviewed the above documentation for accuracy and completeness, and I agree with the above. Gardiner Sleeper, M.D., Ph.D. 03/18/20 1:53 AM   Abbreviations: M myopia (nearsighted); A  astigmatism; H hyperopia (farsighted); P presbyopia; Mrx spectacle prescription;  CTL contact lenses; OD right eye; OS left eye; OU both eyes  XT exotropia; ET esotropia; PEK punctate epithelial keratitis; PEE punctate epithelial erosions; DES dry eye syndrome; MGD meibomian gland dysfunction; ATs artificial tears; PFAT's preservative free artificial tears; [place] nuclear sclerotic cataract; PSC posterior subcapsular cataract; ERM epi-retinal membrane; PVD posterior vitreous detachment; RD retinal detachment; DM diabetes mellitus; DR diabetic retinopathy; NPDR non-proliferative diabetic retinopathy; PDR proliferative diabetic retinopathy; CSME clinically significant macular edema; DME diabetic macular edema; dbh dot blot hemorrhages; CWS cotton wool spot; POAG primary open angle glaucoma; C/D cup-to-disc ratio; HVF humphrey visual field; GVF goldmann visual field; OCT optical coherence tomography; IOP intraocular pressure; BRVO Branch retinal vein occlusion; CRVO central retinal vein occlusion; CRAO central retinal artery occlusion; BRAO branch retinal artery occlusion; RT retinal tear; SB scleral buckle; PPV pars plana vitrectomy; VH Vitreous hemorrhage; PRP panretinal laser photocoagulation; IVK intravitreal kenalog; VMT vitreomacular traction; MH Macular hole;  NVD neovascularization of the disc; NVE neovascularization elsewhere; AREDS age related eye disease study; ARMD age related macular degeneration; POAG primary open angle glaucoma; EBMD epithelial/anterior basement membrane dystrophy; ACIOL anterior chamber intraocular lens; IOL intraocular lens; PCIOL posterior chamber intraocular lens; Phaco/IOL phacoemulsification with intraocular lens placement; [place] photorefractive keratectomy; LASIK laser assisted in situ keratomileusis; HTN hypertension; DM diabetes mellitus; COPD chronic obstructive pulmonary disease

## 2020-03-16 ENCOUNTER — Ambulatory Visit
Admission: RE | Admit: 2020-03-16 | Discharge: 2020-03-16 | Disposition: A | Discharge status: Home / Self Care | Payer: Medicare HMO | Source: Ambulatory Visit | Attending: Sports Medicine | Admitting: Sports Medicine

## 2020-03-16 ENCOUNTER — Ambulatory Visit (INDEPENDENT_AMBULATORY_CARE_PROVIDER_SITE_OTHER): Payer: Medicare HMO | Admitting: Ophthalmology

## 2020-03-16 ENCOUNTER — Other Ambulatory Visit: Payer: Self-pay

## 2020-03-16 ENCOUNTER — Encounter (INDEPENDENT_AMBULATORY_CARE_PROVIDER_SITE_OTHER): Payer: Self-pay | Admitting: Ophthalmology

## 2020-03-16 DIAGNOSIS — Z961 Presence of intraocular lens: Secondary | ICD-10-CM | POA: Diagnosis not present

## 2020-03-16 DIAGNOSIS — E113313 Type 2 diabetes mellitus with moderate nonproliferative diabetic retinopathy with macular edema, bilateral: Secondary | ICD-10-CM

## 2020-03-16 DIAGNOSIS — I10 Essential (primary) hypertension: Secondary | ICD-10-CM | POA: Diagnosis not present

## 2020-03-16 DIAGNOSIS — M545 Low back pain, unspecified: Secondary | ICD-10-CM

## 2020-03-16 DIAGNOSIS — M48061 Spinal stenosis, lumbar region without neurogenic claudication: Secondary | ICD-10-CM | POA: Diagnosis not present

## 2020-03-16 DIAGNOSIS — H35033 Hypertensive retinopathy, bilateral: Secondary | ICD-10-CM | POA: Diagnosis not present

## 2020-03-16 DIAGNOSIS — H3581 Retinal edema: Secondary | ICD-10-CM | POA: Diagnosis not present

## 2020-03-18 ENCOUNTER — Encounter (INDEPENDENT_AMBULATORY_CARE_PROVIDER_SITE_OTHER): Payer: Self-pay | Admitting: Ophthalmology

## 2020-03-18 MED ORDER — AFLIBERCEPT 2MG/0.05ML IZ SOLN FOR KALEIDOSCOPE
2.0000 mg | INTRAVITREAL | Status: AC | PRN
  Administered 2020-03-18: 2 mg via INTRAVITREAL

## 2020-03-22 DIAGNOSIS — M6281 Muscle weakness (generalized): Secondary | ICD-10-CM | POA: Diagnosis not present

## 2020-03-22 DIAGNOSIS — M545 Low back pain: Secondary | ICD-10-CM | POA: Diagnosis not present

## 2020-03-22 DIAGNOSIS — M4807 Spinal stenosis, lumbosacral region: Secondary | ICD-10-CM | POA: Diagnosis not present

## 2020-03-22 DIAGNOSIS — M5136 Other intervertebral disc degeneration, lumbar region: Secondary | ICD-10-CM | POA: Diagnosis not present

## 2020-03-23 ENCOUNTER — Other Ambulatory Visit: Payer: Self-pay

## 2020-03-23 ENCOUNTER — Ambulatory Visit: Payer: Medicare HMO | Admitting: Cardiology

## 2020-03-23 ENCOUNTER — Encounter: Payer: Self-pay | Admitting: Cardiology

## 2020-03-23 VITALS — BP 144/65 | HR 56 | Ht 67.0 in | Wt 188.0 lb

## 2020-03-23 DIAGNOSIS — I739 Peripheral vascular disease, unspecified: Secondary | ICD-10-CM | POA: Diagnosis not present

## 2020-03-23 DIAGNOSIS — I951 Orthostatic hypotension: Secondary | ICD-10-CM

## 2020-03-23 DIAGNOSIS — E7800 Pure hypercholesterolemia, unspecified: Secondary | ICD-10-CM | POA: Diagnosis not present

## 2020-03-23 DIAGNOSIS — I2510 Atherosclerotic heart disease of native coronary artery without angina pectoris: Secondary | ICD-10-CM | POA: Diagnosis not present

## 2020-03-23 MED ORDER — EZETIMIBE 10 MG PO TABS: 10.0000 mg | ORAL_TABLET | Freq: Every day | ORAL | 3 refills | Status: DC

## 2020-03-23 MED ORDER — ISOSORBIDE MONONITRATE ER 120 MG PO TB24: 120.0000 mg | ORAL_TABLET | Freq: Every day | ORAL | 3 refills | Status: DC

## 2020-03-23 NOTE — Progress Notes (Signed)
Primary Physician:  [name], MD   Patient ID: Justin [name], male    DOB: December 29, 1932, 86 y.o.   MRN: [id number]  Chief Complaint  Patient presents with  . PAD  . Follow-up    2 months  . Hypertension  . Hyperlipidemia   HPI:    Justin [name]  is a 86 y.o. male  male with history of CAD, S/P CABG in 2009, PAD, type II diabetes with autonomic insufficiency and autonomic orthostatic hypotension, asymptomatic carotid stenosis, and hyperlipidemia.  He has patent graft except occluded SVG to RCA which is diffusely diseased small vessel and also severe below-knee bilateral lower disease by angiography on 11/04/2016 and repeat peripheral arteriogram on 01/03/20 and has severe slow flow suggestive of microvascular disease.   He presents for 6-week follow up office visit for PAD, hypertension, hyperlipidemia.  On his last office visit I had added Zetia as his LDL was >100.  He is tolerating this well.  I have also added isosorbide mononitrate for peripheral arterial disease due to slow flow.  He still continues to have pain in his legs, which I suspect is from neuropathy.  No rest pain.  No ulceration.   Past Medical History:  Diagnosis Date  . Anxiety   . Arthritis   . Coronary artery disease   . Diabetes mellitus without complication ([place])   . Diabetic retinopathy ([place])    NPDR OU  . Diverticulitis   . Dyspnea   . GERD (gastroesophageal reflux disease)   . Hypercholesteremia   . Hypertension   . Hypertensive retinopathy    OU  . Pneumonia    as a child  . Prostate cancer ([place])   . UTI (lower urinary tract infection)     Past Surgical History:  Procedure Laterality Date  . ABDOMINAL SURGERY     for diverticulitis; also removed appendix  . CATARACT EXTRACTION    . CATARACT EXTRACTION, BILATERAL    . CHOLECYSTECTOMY N/A 10/12/2017   Procedure: LAPAROSCOPIC CHOLECYSTECTOMY;  Surgeon: [name], MD;  Location: [facility];  Service: General;  Laterality: N/A;  .  CORONARY ARTERY BYPASS GRAFT  2009  . ERCP N/A 12/21/2018   Procedure: ENDOSCOPIC RETROGRADE CHOLANGIOPANCREATOGRAPHY (ERCP);  Surgeon: [name], MD;  Location: [facility];  Service: Endoscopy;  Laterality: N/A;  . ESOPHAGOGASTRODUODENOSCOPY (EGD) WITH PROPOFOL N/A 12/17/2018   Procedure: ESOPHAGOGASTRODUODENOSCOPY (EGD) WITH PROPOFOL;  Surgeon: [name], MD;  Location: [facility];  Service: Endoscopy;  Laterality: N/A;  . EUS Left 12/17/2018   Procedure: UPPER ENDOSCOPIC ULTRASOUND (EUS) LINEAR;  Surgeon: [name], MD;  Location: [facility];  Service: Endoscopy;  Laterality: Left;  . EYE SURGERY     "for bleeding in eye"  . HERNIA REPAIR    . LEFT HEART CATH AND CORONARY ANGIOGRAPHY N/A 11/04/2016   Procedure: Left Heart Cath and Coronary Angiography;  Surgeon: [name], MD;  Location: [facility];  Service: Cardiovascular;  Laterality: N/A;  . LOWER EXTREMITY ANGIOGRAPHY N/A 11/04/2016   Procedure: Lower Extremity Angiography;  Surgeon: [name], MD;  Location: [facility];  Service: Cardiovascular;  Laterality: N/A;  . LOWER EXTREMITY ANGIOGRAPHY N/A 01/03/2020   Procedure: LOWER EXTREMITY ANGIOGRAPHY;  Surgeon: [name], MD;  Location: [facility];  Service: Cardiovascular;  Laterality: N/A;  . PROSTATE SURGERY    . REMOVAL OF STONES  12/21/2018   Procedure: REMOVAL OF STONES;  Surgeon: [name], MD;  Location: [facility];  Service: Endoscopy;;  .  SPHINCTEROTOMY  12/21/2018   Procedure: SPHINCTEROTOMY;  Surgeon: [name], MD;  Location: [facility] END[facility];  Service: Endoscopy;;   Social History   Tobacco Use  . Smoking status: Former Smoker    Packs/day: 0.25    Years: 2.00    Pack years: 0.50    Types: Cigarettes  . Smokeless tobacco: Never Used  . Tobacco comment: when he was a teenage age [age]  Substance Use Topics  . Alcohol use: No    Marital Status: Widowed  ROS   Review of Systems  Cardiovascular: Positive for claudication,  dyspnea on exertion and leg swelling. Negative for chest pain, orthopnea and syncope.  Musculoskeletal: Positive for back pain and muscle weakness.  Neurological: Positive for dizziness.  All other systems reviewed and are negative.  Objective  Blood pressure (!) 144/65, pulse (!) 56, height 5\' 7"  (1.702 m), weight 188 lb (85.3 kg), SpO2 100 %. Body mass index is 29.44 kg/m.  Vitals with BMI 03/23/2020 01/23/2020 01/03/2020  Height 5\' 7"  5\' 7"  -  Weight 188 lbs 186 lbs -  BMI 40.81 44.81 -  Systolic 856 314 970  Diastolic 65 70 63  Pulse 56 63 58    Orthostatic VS for the past 72 hrs (Last 3 readings):  Patient Position BP Location Cuff Size  03/23/20 1053 Sitting Left Arm Large    Physical Exam Vitals reviewed.  Constitutional:      Appearance: He is well-developed.  Cardiovascular:     Rate and Rhythm: Normal rate and regular rhythm.     Pulses: Intact distal pulses.          Carotid pulses are on the right side with bruit and on the left side with bruit.      Femoral pulses are 2+ on the right side and 2+ on the left side.      Popliteal pulses are 1+ on the right side and 1+ on the left side.       Dorsalis pedis pulses are 0 on the right side and 0 on the left side.       Posterior tibial pulses are 0 on the right side and 0 on the left side.     Heart sounds: Normal heart sounds.     Comments: Split S2. NO JVD. No leg edema Pulmonary:     Effort: Pulmonary effort is normal. No accessory muscle usage or respiratory distress.     Breath sounds: Normal breath sounds.  Abdominal:     General: Bowel sounds are normal.     Palpations: Abdomen is soft.    Laboratory examination:    CMP Latest Ref Rng & Units 01/03/2020 12/19/2019 03/21/2019  Glucose 70 - 99 mg/dL 124(H) 271(H) 183(H)  BUN 8 - 23 mg/dL 36(H) 27 30(H)  Creatinine 0.61 - 1.24 mg/dL 1.50(H) 1.48(H) 1.74(H)  Sodium 135 - 145 mmol/L 143 139 141  Potassium 3.5 - 5.1 mmol/L 5.1 4.2 4.1  Chloride 98 - 111 mmol/L  110 106 107(H)  CO2 20 - 29 mmol/L - 20 21  Calcium 8.6 - 10.2 mg/dL - 9.2 9.8  Total Protein 6.0 - 8.5 g/dL - - 7.1  Total Bilirubin 0.0 - 1.2 mg/dL - - 0.3  Alkaline Phos 39 - 117 IU/L - - 83  AST 0 - 40 IU/L - - 16  ALT 0 - 44 IU/L - - 16   CBC Latest Ref Rng & Units 01/03/2020 12/19/2019 12/23/2018  WBC 3.4 - 10.8 x10E3/uL - 9.1 8.5  Hemoglobin 13.0 - 17.0 g/dL 12.9(L) 13.2 9.2(L)  Hematocrit 39 - 52 % 38.0(L) 41.8 28.1(L)  Platelets 150 - 450 x10E3/uL - 203 162   Lipid Panel     Component Value Date/Time   CHOL 162 03/02/2020 0819   TRIG 66 03/02/2020 0819   HDL 63 03/02/2020 0819   LDLCALC 86 03/02/2020 0819   HEMOGLOBIN A1C Lab Results  Component Value Date   HGBA1C 8.0 (H) 10/08/2017   MPG 182.9 10/08/2017   TSH No results for input(s): TSH in the last 8760 hours.   External labs:  Cholesterol, total 155.000 M 03/14/2019 HDL 61.000 M 03/14/2019 LDL 105 NHDL 94 Triglycerides 67.000 M 03/14/2019   A1C 7.100 % 06/08/2019; TSH 1.530 03/14/2019  Hemoglobin 12.600 G/ 07/04/2019; INR 1.300 12/14/2018 Platelets 198.000 X 07/04/2019  Creatinine, Serum 1.700 MG/ 07/04/2019 Potassium 4.100 03/21/2019 Magnesium 2.000 MG/ 09/17/2016 ALT (SGPT) 16.000 03/21/2019  BNP 46.800 PG 10/13/2016  PRN Meds:. Medications Discontinued During This Encounter  Medication Reason  . methylPREDNISolone acetate (DEPO-MEDROL) 40 MG/ML injection Patient Preference  . pantoprazole (PROTONIX) 40 MG tablet Discontinued by provider  . ramipril (ALTACE) 10 MG capsule Discontinued by provider  . rosuvastatin (CRESTOR) 20 MG tablet Patient Preference  . ezetimibe (ZETIA) 10 MG tablet Reorder  . isosorbide mononitrate (IMDUR) 60 MG 24 hr tablet Reorder   Current Meds  Medication Sig  . ACCU-CHEK SMARTVIEW test strip   . acetaminophen (TYLENOL) 500 MG tablet Take 1,000 mg by mouth every 4 (four) hours as needed for mild pain.   Marland Kitchen AMITIZA 24 MCG capsule Take 24 mcg by mouth as needed for constipation.    Marland Kitchen aspirin EC 81 MG tablet Take 81 mg by mouth daily.  . chlorthalidone (HYGROTON) 50 MG tablet Take 25 mg by mouth daily.   . clobetasol cream (TEMOVATE) 0.05 %   . clonazePAM (KLONOPIN) 0.5 MG tablet Take 0.5 mg by mouth daily as needed for anxiety.  Marland Kitchen ezetimibe (ZETIA) 10 MG tablet Take 1 tablet (10 mg total) by mouth daily after supper.  . gabapentin (NEURONTIN) 300 MG capsule   . isosorbide mononitrate (IMDUR) 120 MG 24 hr tablet Take 1 tablet (120 mg total) by mouth daily.  Marland Kitchen JANUVIA 100 MG tablet Take 100 mg by mouth daily.   Marland Kitchen LANTUS SOLOSTAR 100 UNIT/ML Solostar Pen Inject 15-50 Units into the skin See admin instructions. Inject 50 units in the morning and 15 units in the evening  . metoprolol (LOPRESSOR) 50 MG tablet Take 25 mg by mouth 2 (two) times daily.  Marland Kitchen omeprazole (PRILOSEC) 40 MG capsule Take 40 mg by mouth daily.   . simvastatin (ZOCOR) 20 MG tablet   . tamsulosin (FLOMAX) 0.4 MG CAPS capsule Take 0.4 mg by mouth daily.  . vitamin B-12 (CYANOCOBALAMIN) 1000 MCG tablet Take 1,000 mcg by mouth daily.  . vitamin B-6 (PYRIDOXINE) 25 MG tablet Take 25 mg by mouth daily.  . [DISCONTINUED] ezetimibe (ZETIA) 10 MG tablet Take 1 tablet (10 mg total) by mouth daily after supper.  . [DISCONTINUED] isosorbide mononitrate (IMDUR) 60 MG 24 hr tablet Take 1 tablet (60 mg total) by mouth daily.   Medication and allergies    Allergies  Allergen Reactions  . Contrast Media [Iodinated Diagnostic Agents] Itching    PT STATES ALLERGY TO "CT DYE".  [name] [Red Dye] Swelling    CAT scan dye  . Ibuprofen Other (See Comments)    Upset stomach   Needing hospital admission for  24 hours and eventually discharged home the following day. Current Outpatient Medications  Medication Instructions  . ACCU-CHEK SMARTVIEW test strip No dose, route, or frequency recorded.  Marland Kitchen acetaminophen (TYLENOL) 1,000 mg, Oral, Every 4 hours PRN  . Amitiza 24 mcg, Oral, As needed  . aspirin EC 81 mg, Oral,  Daily  . chlorthalidone (HYGROTON) 25 mg, Oral, Daily  . clobetasol cream (TEMOVATE) 0.05 % No dose, route, or frequency recorded.  . clonazePAM (KLONOPIN) 0.5 mg, Oral, Daily PRN  . ezetimibe (ZETIA) 10 mg, Oral, Daily after supper  . gabapentin (NEURONTIN) 300 MG capsule No dose, route, or frequency recorded.  . isosorbide mononitrate (IMDUR) 120 mg, Oral, Daily  . Januvia 100 mg, Oral, Daily  . Lantus SoloStar 15-50 Units, Subcutaneous, See admin instructions, Inject 50 units in the morning and 15 units in the evening  . metoprolol tartrate (LOPRESSOR) 25 mg, Oral, 2 times daily  . omeprazole (PRILOSEC) 40 mg, Oral, Daily  . simvastatin (ZOCOR) 20 MG tablet No dose, route, or frequency recorded.  . tamsulosin (FLOMAX) 0.4 mg, Oral, Daily  . vitamin B-12 (CYANOCOBALAMIN) 1,000 mcg, Oral, Daily  . vitamin B-6 (PYRIDOXINE) 25 mg, Oral, Daily   Radiology:   No results found.   Cardiac Studies:   Lexiscan myoview stress test 04/17/2016: 1. Resting EKG demonstrates normal sinus rhythm, left axis deviation, right bundle branch block. Stress EKG is nondiagnostic for ischemia as a pharmacologic stress test. There are frequent PACs during the stress test. Stress symptoms included dyspnea. 2. The Perfusion images reveal the left ventricle to be mildly dilated at 132 mL both in rest and stress images. There is a moderate-sized inferior and inferoapical scar with very mild peri-infarct ischemia noted especially towards the apex. Left ventricle systolic function was moderate to severely depressed at 36%. 3. This is an intermediate risk study, clinical correlation recommended.    Coronary angiogram 11/04/2016: Native RCA small and diffusely diseased with distal 70-80% stenosis. Occluded SVG to RCA. Patent SVG to OM-RI, LIMA to LAD. Medical therapy.  Echo- 12/24/2017 1. Left ventricle cavity is normal in size. Moderate concentric hypertrophy of the left ventricle. Normal global echocardiogram  wall motion. Doppler evidence of grade I (impaired) diastolic dysfunction. Calculated EF 54%. 2. Left atrial cavity is mildly dilated. 3. Trileaflet aortic valve. Mild aortic valve leaflet calcification. 4. Mild to moderate mitral regurgitation. Mild calcification function of the mitral valve annulus. Mild mitral valve leaflet patient later uptitrated to calcification. 5. Trace tricuspid regurgitation.   ABI 05/02/2019: No hemodynamically significant stenoses are identified in the bilateral lower extremity arterial system.   Non-compressible ABI bilateral, suggestive of medial calcinosis. Moderately abnormal waveform right and mildly abnormal waveform left lower extremity at the level of the ankle. Study suggests small vessel disease.  Carotid artery duplex 10/11/2019:  Stenosis in the right internal carotid artery (50-69%). The right PSV  internal/common carotid artery ratio of 4.88 is consistent with a stenosis  of >70%. Stenosis in the right external carotid artery (<50%).  Stenosis in the left internal carotid artery (50-69%).  Stenosis in the left external carotid artery (<50%).  Peak velocity in the right ICA is 152/24 cm/s and left 203/27 cm/s. Antegrade right vertebral artery flow. Antegrade left vertebral artery  flow.  No significant change since 05/18/2019. Follow up in six months is  appropriate if clinically indicated.  Lower Extremity Arterial Duplex 11/30/2019:  No hemodynamically significant stenosis in bilateral lower extremity above  the knee.  Moderate velocity increase at the  right mid posterior tibial artery.  suggests >50%stenosis.  Diffuse small vessel disease with moderately abnormal waveform right ankle  and severely abnormal waveform left ankle.  Non compressible vessels bilateral at the ankles suggests medial  calcinosis.   No significant change from 05/02/2019.  Peripheral arteriogram 01/03/2020: Distal abdominal aortogram with bifemoral arteriogram reveals  widely patent iliac vessels and common femoral vessels.  Left SFA and left popliteal artery are widely patent with diffuse arterial sclerosis.  No luminal irregularity.  Below the left knee, two-vessel runoff in the form of PT and peroneal artery.  There is diffuse disease noted in both the peroneal and PT.  AT is occluded. Similarly on the right no significant disease in the SFA and there is probably a two-vessel runoff in the form of peroneal artery and PT.  AT is occluded. Recommendation: Patient has very slow flow suggestive of microvascular disease and also small vessel disease.  Unless critical limb ischemia no reason for angioplasty, continue medical therapy.  50 mill contrast utilized.  EKG:  EKG 11/14/2019: Normal sinus rhythm at 74 bpm with 1 PAC, left axis deviation, left anterior fasicular block, RBBB. Nonspecific T wave abnormality.  Assessment:     ICD-10-CM   1. Claudication in peripheral vascular disease (HCC)  I73.9 isosorbide mononitrate (IMDUR) 120 MG 24 hr tablet    ezetimibe (ZETIA) 10 MG tablet  2. Hypercholesteremia  E78.00 ezetimibe (ZETIA) 10 MG tablet  3. Orthostatic hypotension  I95.1   4. Atherosclerosis of native coronary artery of native heart without angina pectoris  I25.10 isosorbide mononitrate (IMDUR) 120 MG 24 hr tablet   Recommendations:   Justin [name]  is a 86 y.o. male with history of CAD, S/P CABG in 2009, PAD, type II diabetes with autonomic insufficiency and autonomic orthostatic hypotension, asymptomatic carotid stenosis, and hyperlipidemia.  He has patent graft except occluded SVG to RCA which is diffusely diseased small vessel and also severe below-knee bilateral lower disease by angiography on 11/04/2016 and repeat peripheral arteriogram on 01/03/20 and has severe slow flow suggestive of microvascular disease.    He is now tolerating Zetia that was added previously, lipids have improved significantly although completely not at goal, would accept less  than 100 as goal for now.  He is also tolerating isosorbide mononitrate, overall feeling well, as his blood pressure stable, I will increase it to 120 mg daily, otherwise residual pain in his legs that he has at rest is mostly due to peripheral neuropathy from diabetes.  The blood pressure was minimally elevated today, I do not want to be too aggressive with hypertension treatment in view of orthostatic hypotension.  Otherwise stable from cardiac standpoint, I will see him back in 6 months with orthostatic check.  [name], MD, [facility] 03/23/2020, [facility]. [place] Office: [phone]

## 2020-03-29 DIAGNOSIS — M5136 Other intervertebral disc degeneration, lumbar region: Secondary | ICD-10-CM | POA: Diagnosis not present

## 2020-03-29 DIAGNOSIS — M4807 Spinal stenosis, lumbosacral region: Secondary | ICD-10-CM | POA: Diagnosis not present

## 2020-03-29 DIAGNOSIS — M6281 Muscle weakness (generalized): Secondary | ICD-10-CM | POA: Diagnosis not present

## 2020-03-29 DIAGNOSIS — M545 Low back pain: Secondary | ICD-10-CM | POA: Diagnosis not present

## 2020-04-03 DIAGNOSIS — M5136 Other intervertebral disc degeneration, lumbar region: Secondary | ICD-10-CM | POA: Diagnosis not present

## 2020-04-03 DIAGNOSIS — M4807 Spinal stenosis, lumbosacral region: Secondary | ICD-10-CM | POA: Diagnosis not present

## 2020-04-03 DIAGNOSIS — M545 Low back pain: Secondary | ICD-10-CM | POA: Diagnosis not present

## 2020-04-03 DIAGNOSIS — M6281 Muscle weakness (generalized): Secondary | ICD-10-CM | POA: Diagnosis not present

## 2020-04-05 DIAGNOSIS — M545 Low back pain: Secondary | ICD-10-CM | POA: Diagnosis not present

## 2020-04-05 DIAGNOSIS — M4807 Spinal stenosis, lumbosacral region: Secondary | ICD-10-CM | POA: Diagnosis not present

## 2020-04-05 DIAGNOSIS — M6281 Muscle weakness (generalized): Secondary | ICD-10-CM | POA: Diagnosis not present

## 2020-04-05 DIAGNOSIS — M5136 Other intervertebral disc degeneration, lumbar region: Secondary | ICD-10-CM | POA: Diagnosis not present

## 2020-04-06 ENCOUNTER — Ambulatory Visit: Payer: Medicare HMO

## 2020-04-06 ENCOUNTER — Other Ambulatory Visit: Payer: Self-pay

## 2020-04-06 DIAGNOSIS — I6523 Occlusion and stenosis of bilateral carotid arteries: Secondary | ICD-10-CM | POA: Diagnosis not present

## 2020-04-08 ENCOUNTER — Other Ambulatory Visit: Payer: Self-pay | Admitting: Cardiology

## 2020-04-08 DIAGNOSIS — I6523 Occlusion and stenosis of bilateral carotid arteries: Secondary | ICD-10-CM

## 2020-04-10 DIAGNOSIS — M5136 Other intervertebral disc degeneration, lumbar region: Secondary | ICD-10-CM | POA: Diagnosis not present

## 2020-04-10 DIAGNOSIS — M6281 Muscle weakness (generalized): Secondary | ICD-10-CM | POA: Diagnosis not present

## 2020-04-10 DIAGNOSIS — M4807 Spinal stenosis, lumbosacral region: Secondary | ICD-10-CM | POA: Diagnosis not present

## 2020-04-10 DIAGNOSIS — M545 Low back pain: Secondary | ICD-10-CM | POA: Diagnosis not present

## 2020-04-12 DIAGNOSIS — M545 Low back pain: Secondary | ICD-10-CM | POA: Diagnosis not present

## 2020-04-12 DIAGNOSIS — M4807 Spinal stenosis, lumbosacral region: Secondary | ICD-10-CM | POA: Diagnosis not present

## 2020-04-12 DIAGNOSIS — M6281 Muscle weakness (generalized): Secondary | ICD-10-CM | POA: Diagnosis not present

## 2020-04-12 DIAGNOSIS — M5136 Other intervertebral disc degeneration, lumbar region: Secondary | ICD-10-CM | POA: Diagnosis not present

## 2020-04-13 ENCOUNTER — Other Ambulatory Visit: Payer: Medicare PPO

## 2020-04-16 DIAGNOSIS — I10 Essential (primary) hypertension: Secondary | ICD-10-CM | POA: Diagnosis not present

## 2020-04-16 DIAGNOSIS — N400 Enlarged prostate without lower urinary tract symptoms: Secondary | ICD-10-CM | POA: Diagnosis not present

## 2020-04-16 DIAGNOSIS — E782 Mixed hyperlipidemia: Secondary | ICD-10-CM | POA: Diagnosis not present

## 2020-04-16 DIAGNOSIS — G629 Polyneuropathy, unspecified: Secondary | ICD-10-CM | POA: Diagnosis not present

## 2020-04-16 DIAGNOSIS — E1142 Type 2 diabetes mellitus with diabetic polyneuropathy: Secondary | ICD-10-CM | POA: Diagnosis not present

## 2020-04-16 DIAGNOSIS — F419 Anxiety disorder, unspecified: Secondary | ICD-10-CM | POA: Diagnosis not present

## 2020-04-16 DIAGNOSIS — R1013 Epigastric pain: Secondary | ICD-10-CM | POA: Diagnosis not present

## 2020-04-16 DIAGNOSIS — E1165 Type 2 diabetes mellitus with hyperglycemia: Secondary | ICD-10-CM | POA: Diagnosis not present

## 2020-04-16 NOTE — Progress Notes (Signed)
[facility] Note  04/20/2020     CHIEF COMPLAINT Patient presents for Retina Follow Up   HISTORY OF PRESENT ILLNESS: Justin [name] is a 86 y.o. male who presents to the clinic today for:   HPI    Retina Follow Up    Patient presents with  Diabetic Retinopathy.  In both eyes.  This started months ago.  Severity is moderate.  Duration of 5 weeks.  Since onset it is stable.  I, the attending physician,  performed the HPI with the patient and updated documentation appropriately.          Comments    86 y/o male pt here for 5 wk f/u for mod NPDR w/mac edema OU.  No change in [place] OU.  Denies pain, FOL, new floaters.  BS 136 this a.m.  A1C 7.1.  No gtts.       Last edited by [name], MD on 04/20/2020  1:42 PM. (History)    Pt states no changes in vision OU   Referring physician: [name], MD [address] [place],  [place] [address]  HISTORICAL INFORMATION:   Selected notes from the MEDICAL RECORD NUMBER Referred by Dr. [name] for concern of mac edema OU    CURRENT MEDICATIONS: No current outpatient medications on file. (Ophthalmic Drugs)   No current facility-administered medications for this visit. (Ophthalmic Drugs)   Current Outpatient Medications (Other)  Medication Sig  . ACCU-CHEK SMARTVIEW test strip   . acetaminophen (TYLENOL) 500 MG tablet Take 1,000 mg by mouth every 4 (four) hours as needed for mild pain.   Marland Kitchen AMITIZA 24 MCG capsule Take 24 mcg by mouth as needed for constipation.   Marland Kitchen aspirin EC 81 MG tablet Take 81 mg by mouth daily.  . chlorthalidone (HYGROTON) 50 MG tablet Take 25 mg by mouth daily.   . clobetasol cream (TEMOVATE) 0.05 %   . clonazePAM (KLONOPIN) 0.5 MG tablet Take 0.5 mg by mouth daily as needed for anxiety.  . DULoxetine (CYMBALTA) 30 MG capsule   . ezetimibe (ZETIA) 10 MG tablet Take 1 tablet (10 mg total) by mouth daily after supper.  . gabapentin (NEURONTIN) 300 MG capsule   .  isosorbide mononitrate (IMDUR) 120 MG 24 hr tablet Take 1 tablet (120 mg total) by mouth daily.  Marland Kitchen JANUVIA 100 MG tablet Take 100 mg by mouth daily.   Marland Kitchen LANTUS SOLOSTAR 100 UNIT/ML Solostar Pen Inject 15-50 Units into the skin See admin instructions. Inject 50 units in the morning and 15 units in the evening  . LINZESS 145 MCG CAPS capsule   . metoprolol (LOPRESSOR) 50 MG tablet Take 25 mg by mouth 2 (two) times daily.  Marland Kitchen omeprazole (PRILOSEC) 40 MG capsule Take 40 mg by mouth daily.   . ramipril (ALTACE) 10 MG capsule   . simvastatin (ZOCOR) 20 MG tablet   . tamsulosin (FLOMAX) 0.4 MG CAPS capsule Take 0.4 mg by mouth daily.  . vitamin B-12 (CYANOCOBALAMIN) 1000 MCG tablet Take 1,000 mcg by mouth daily.  . vitamin B-6 (PYRIDOXINE) 25 MG tablet Take 25 mg by mouth daily.  [name] 2.5 MG TABS tablet    No current facility-administered medications for this visit. (Other)      REVIEW OF SYSTEMS: ROS    Positive for: Gastrointestinal, Endocrine, Cardiovascular, Eyes   Negative for: Constitutional, Neurological, Skin, Genitourinary, Musculoskeletal, HENT, Respiratory, Psychiatric, Allergic/Imm, Heme/Lymph   Last edited by Matthew Folks,  COA on 04/20/2020  1:06 PM. (History)       ALLERGIES Allergies  Allergen Reactions  . Contrast Media [Iodinated Diagnostic Agents] Itching    PT STATES ALLERGY TO "CT DYE".  [name] [Red Dye] Swelling    CAT scan dye  . Ibuprofen Other (See Comments)    Upset stomach     PAST MEDICAL HISTORY Past Medical History:  Diagnosis Date  . Anxiety   . Arthritis   . Coronary artery disease   . Diabetes mellitus without complication ([place])   . Diabetic retinopathy ([place])    NPDR OU  . Diverticulitis   . Dyspnea   . GERD (gastroesophageal reflux disease)   . Hypercholesteremia   . Hypertension   . Hypertensive retinopathy    OU  . Pneumonia    as a child  . Prostate cancer ([place])   . UTI (lower urinary tract infection)    Past  Surgical History:  Procedure Laterality Date  . ABDOMINAL SURGERY     for diverticulitis; also removed appendix  . CATARACT EXTRACTION    . CATARACT EXTRACTION, BILATERAL    . CHOLECYSTECTOMY N/A 10/12/2017   Procedure: LAPAROSCOPIC CHOLECYSTECTOMY;  Surgeon: [name], MD;  Location: [facility];  Service: General;  Laterality: N/A;  . CORONARY ARTERY BYPASS GRAFT  2009  . ERCP N/A 12/21/2018   Procedure: ENDOSCOPIC RETROGRADE CHOLANGIOPANCREATOGRAPHY (ERCP);  Surgeon: [name], MD;  Location: [facility];  Service: Endoscopy;  Laterality: N/A;  . ESOPHAGOGASTRODUODENOSCOPY (EGD) WITH PROPOFOL N/A 12/17/2018   Procedure: ESOPHAGOGASTRODUODENOSCOPY (EGD) WITH PROPOFOL;  Surgeon: [name], MD;  Location: [facility];  Service: Endoscopy;  Laterality: N/A;  . EUS Left 12/17/2018   Procedure: UPPER ENDOSCOPIC ULTRASOUND (EUS) LINEAR;  Surgeon: [name], MD;  Location: [facility];  Service: Endoscopy;  Laterality: Left;  . EYE SURGERY     "for bleeding in eye"  . HERNIA REPAIR    . LEFT HEART CATH AND CORONARY ANGIOGRAPHY N/A 11/04/2016   Procedure: Left Heart Cath and Coronary Angiography;  Surgeon: [name], MD;  Location: [facility];  Service: Cardiovascular;  Laterality: N/A;  . LOWER EXTREMITY ANGIOGRAPHY N/A 11/04/2016   Procedure: Lower Extremity Angiography;  Surgeon: [name], MD;  Location: [facility];  Service: Cardiovascular;  Laterality: N/A;  . LOWER EXTREMITY ANGIOGRAPHY N/A 01/03/2020   Procedure: LOWER EXTREMITY ANGIOGRAPHY;  Surgeon: [name], MD;  Location: [facility];  Service: Cardiovascular;  Laterality: N/A;  . PROSTATE SURGERY    . REMOVAL OF STONES  12/21/2018   Procedure: REMOVAL OF STONES;  Surgeon: [name], MD;  Location: [facility];  Service: Endoscopy;;  . [name]  12/21/2018   Procedure: [name];  Surgeon: [name], MD;  Location: [facility];  Service: Endoscopy;;    FAMILY HISTORY Family  History  Problem Relation Age of Onset  . Diabetes Father   . Diabetes Maternal Aunt   . Diabetes Maternal Uncle   . Diabetes Maternal Grandmother   . Heart disease Sister   . Diabetes Brother   . Heart disease Sister     SOCIAL HISTORY Social History   Tobacco Use  . Smoking status: Former Smoker    Packs/day: 0.25    Years: 2.00    Pack years: 0.50    Types: Cigarettes  . Smokeless tobacco: Never Used  . Tobacco comment: when he was a teenage age [age]  Vaping Use  . Vaping Use: Never used  Substance Use  Topics  . Alcohol use: No  . Drug use: No         OPHTHALMIC EXAM:  Base Eye Exam    Visual Acuity (Snellen - Linear)      Right Left   Dist cc 20/30 -2 20/40 -2   Dist ph cc NI 20/30 -2   Correction: Glasses       Tonometry (Tonopen, 1:11 PM)      Right Left   Pressure 11 13       Pupils      Dark Light Shape React APD   Right 3 2 Round Brisk None   Left 3 2 Round Brisk None       Visual Fields (Counting fingers)      Left Right    Full Full       Extraocular Movement      Right Left    Full, Ortho Full, Ortho       Neuro/Psych    Oriented x3: Yes   Mood/Affect: Normal       Dilation    Both eyes: 1.0% Mydriacyl, 2.5% Phenylephrine @ 1:11 PM        Slit Lamp and Fundus Exam    Slit Lamp Exam      Right Left   Lids/Lashes Dermatochalasis - upper lid, Meibomian gland dysfunction Dermatochalasis - upper lid, Meibomian gland dysfunction   Conjunctiva/Sclera Melanosis Melanosis   Cornea Arcus, 1+ Punctate epithelial erosions Arcus, 1+ Punctate epithelial erosions   Anterior Chamber Deep and quiet Deep and quiet   Iris Mild Temporal Iris atrophy, Round and dilated, No NVI Mild Temporal Iris atrophy, Round and dilated, No NVI   Lens PC IOL in good position with mild aterior capsule fimosis PC IOL in good position with mild aterior capsule fimosis   Vitreous Vitreous syneresis, Posterior vitreous detachment, vitreous condensations Vitreous  syneresis, Posterior vitreous detachment, vitreous condensations       Fundus Exam      Right Left   Disc pallor with sharp rim, Peripapillary atrophy, Compact mild pallor, sharp rim, Tilted disc, mild temporal Temporal Peripapillary atrophy, Compact   C/D Ratio 0.2 0.3   Macula Blunted foveal reflex, Retinal pigment epithelial mottling and clumping, Microaneurysms -- improved, persistent central cystic changes  Blunted foveal reflex, focal area of RPE atrophy SN to fovea, Microaneurysms, Retinal pigment epithelial mottling, refractile Epiretinal membrane,  central cystic changes persistent/ slightly improved   Vessels Vascular attenuation, Tortuous Tortuous, Vascular attenuation   Periphery Attached, scattered MA, good peripheral PRP 360 - room for posterior fill in if needed Attached, scattered IRH, Peripheral PRP 360--room for posterior fill in if needed          IMAGING AND PROCEDURES  Imaging and Procedures for _0 @  OCT, Retina - OU - Both Eyes       Right Eye Quality was good. Central Foveal Thickness: 373. Progression has been stable. Findings include abnormal foveal contour, intraretinal fluid, no SRF, retinal drusen , outer retinal atrophy (Persistent central cystic changes -- no significant improvement from prior).   Left Eye Quality was good. Central Foveal Thickness: 255. Progression has been stable. Findings include abnormal foveal contour, no SRF, intraretinal fluid, outer retinal atrophy, retinal drusen  (Persistent cystic changes -- mild).   Notes *Images captured and stored on drive  Diagnosis / Impression:  DME OU, OD>OS OD: Persistent central cystic changes, no significant improvement from prior OS: Persistent central cystic changes -- mild  Clinical management:  See below  Abbreviations: NFP - Normal foveal profile. CME - cystoid macular edema. PED - pigment epithelial detachment. IRF - intraretinal fluid. SRF - subretinal fluid. EZ - ellipsoid zone. ERM  - epiretinal membrane. ORA - outer retinal atrophy. ORT - outer retinal tubulation. SRHM - subretinal hyper-reflective material         Intravitreal Injection, Pharmacologic Agent - OD - Right Eye       Time Out 04/20/2020. 1:06 PM. Confirmed correct patient, procedure, site, and patient consented.   Anesthesia Topical anesthesia was used. Anesthetic medications included Lidocaine 2%, Proparacaine 0.5%.   Procedure Preparation included 5% betadine to ocular surface, eyelid speculum. A (32g) needle was used.   Injection:  2 mg aflibercept Alfonse Flavors) SOLN   NDC: A3590391, Lot: [id number], Expiration date: 06/29/2020   Route: Intravitreal, Site: Right Eye, Waste: 0.05 mL  Post-op Post injection exam found visual acuity of at least counting fingers. The patient tolerated the procedure well. There were no complications. The patient received written and verbal post procedure care education.        Intravitreal Injection, Pharmacologic Agent - OS - Left Eye       Time Out 04/20/2020. 1:07 PM. Confirmed correct patient, procedure, site, and patient consented.   Anesthesia Topical anesthesia was used. Anesthetic medications included Lidocaine 2%, Proparacaine 0.5%.   Procedure Preparation included 5% betadine to ocular surface, eyelid speculum. A (32g) needle was used.   Injection:  2 mg aflibercept Alfonse Flavors) SOLN   NDC: A3590391, Lot: [id number], Expiration date: 06/29/2020   Route: Intravitreal, Site: Left Eye, Waste: 0.05 mL  Post-op Post injection exam found visual acuity of at least counting fingers. The patient tolerated the procedure well. There were no complications. The patient received written and verbal post procedure care education.                 ASSESSMENT/PLAN:    ICD-10-CM   1. Moderate nonproliferative diabetic retinopathy of both eyes with macular edema associated with type 2 diabetes mellitus (HCC)  R94.5859 Intravitreal Injection, Pharmacologic  Agent - OD - Right Eye    Intravitreal Injection, Pharmacologic Agent - OS - Left Eye    aflibercept (EYLEA) SOLN 2 mg    aflibercept (EYLEA) SOLN 2 mg  2. Retinal edema  H35.81 OCT, Retina - OU - Both Eyes  3. Essential hypertension  I10   4. Hypertensive retinopathy of both eyes  H35.033   5. Pseudophakia of both eyes  Z96.1     1,2. Moderate non-proliferative diabetic retinopathy with edema OU   - moved to [place] from [place], [place] -- history of prior injections OS with Dr. [name]  - records received and reviewed from [facility] (Dr. [name])  - history of focal laser OS x2 and IVK/IVT OU in [place] -- last injection was IVK OS November 2013  - initial exam: scattered IRH, DBH and +macular edema  - initial OCT: diabetic macular edema, both eyes, (OD > OS)  - FA (10.1.19) showed late leaking MA OU -- no NV  - delayed f/u on 4.16.20 due to hospitalization for sepsis / gallstones  - S/P PRP OD (07.14.20) - good laser surrounding, room for posterior fill in if needed  - S/P PRP OS (08.26.20)  - S/P IVA OD #1 (09.17.19), #2 (10.29.19), #3 (11.26.19), #4 (01.02.20), #5 (01.30.20), #6 (02.28.20), #7 (04.16.20), #8 (06.11.20), #9 (08.12.20), #10 (01.11.21)  - S/P IVA OS #1 (10.01.19), #2 (10.29.19), #3 (11.26.19), #4 (01.02.20), #5 (01.30.20), #6 (02.28.20), #7 (04.16.20), #  8 (05.14.20), #9 (06.11.20), #10 (07.09.20), #11 (08.12.20), #12 (01.11.21)  - S/P IVK OD #1 (05.14.20) -- no significant improvement  - switched back to Avastin OD (#9 6.11.2020)  - repeated Eylea4U benefits investigation due to possible IVA resistance (8.12.20)  - S/P IVE OS #1 (09.16.20), #2 (10.16.20), #3 (11.13.20), #4 (12.11.20), #5 (02.08.21), #6 (03.12.21), #7 (04.16.21), #8 (05.14.21), #9 (06.18.21)  - S/P IVE OD #1 (09.16.20), #2 (10.16.20), #3 (11.13.20), #4 (12.11.20), #5 (02.08.21), #6 (03.12.21), #7 (04.16.21), #8 (05.14.21), #9 (06.18.21)  - OCT today shows mild interval improvement in cystic  changes OU  - BCVA: OD stable at 20/30+2 and OS stable at 20/30   - recommend IVE OU #10 today, 07.23.21   - Eylea4U benefits investigation completed and verified for 2021 as of 02.08.21  - pt wishes to proceed  - RBA of procedure discussed, questions answered  - informed consent obtained  - see procedure note  - Eylea informed consent form signed and scanned on 12.11.2020  - f/u 5 weeks, DFE, OCT, possible injection  3,4. Hypertensive retinopathy OU  - discussed importance of tight BP control  - monitor  5. Pseudophakia OU  - s/p CE/IOL OU in [place], [place]  - beautiful surgeries, doing well  - monitor   Ophthalmic Meds Ordered this visit:  Meds ordered this encounter  Medications  . aflibercept (EYLEA) SOLN 2 mg  . aflibercept (EYLEA) SOLN 2 mg       Return in about 5 weeks (around 05/25/2020) for f/u DME OU --, Dilated Exam, OCT, Possible Injxn.  There are no Patient Instructions on file for this visit.   Explained the diagnoses, plan, and follow up with the patient and they expressed understanding.  Patient expressed understanding of the importance of proper follow up care.   This document serves as a record of services personally performed by Gardiner Sleeper, MD, PhD. It was created on their behalf by San Jetty. Owens Shark, OA an ophthalmic technician. The creation of this record is the provider's dictation and/or activities during the visit.    Electronically signed by: San Jetty. Owens Shark, [place] 07.19.2021 10:08 PM   Gardiner Sleeper, M.D., Ph.D. Diseases & Surgery of the Retina and Vitreous [facility]  I have reviewed the above documentation for accuracy and completeness, and I agree with the above. Gardiner Sleeper, M.D., Ph.D. 04/22/20 10:10 PM    Abbreviations: M myopia (nearsighted); A astigmatism; H hyperopia (farsighted); P presbyopia; Mrx spectacle prescription;  CTL contact lenses; OD right eye; OS left eye; OU both eyes  XT exotropia; ET  esotropia; PEK punctate epithelial keratitis; PEE punctate epithelial erosions; DES dry eye syndrome; MGD meibomian gland dysfunction; ATs artificial tears; PFAT's preservative free artificial tears; [place] nuclear sclerotic cataract; PSC posterior subcapsular cataract; ERM epi-retinal membrane; PVD posterior vitreous detachment; RD retinal detachment; DM diabetes mellitus; DR diabetic retinopathy; NPDR non-proliferative diabetic retinopathy; PDR proliferative diabetic retinopathy; CSME clinically significant macular edema; DME diabetic macular edema; dbh dot blot hemorrhages; CWS cotton wool spot; POAG primary open angle glaucoma; C/D cup-to-disc ratio; HVF humphrey visual field; GVF goldmann visual field; OCT optical coherence tomography; IOP intraocular pressure; BRVO Branch retinal vein occlusion; CRVO central retinal vein occlusion; CRAO central retinal artery occlusion; BRAO branch retinal artery occlusion; RT retinal tear; SB scleral buckle; PPV pars plana vitrectomy; VH Vitreous hemorrhage; PRP panretinal laser photocoagulation; IVK intravitreal kenalog; VMT vitreomacular traction; MH Macular hole;  NVD neovascularization of the disc; NVE neovascularization elsewhere; AREDS  age related eye disease study; ARMD age related macular degeneration; POAG primary open angle glaucoma; EBMD epithelial/anterior basement membrane dystrophy; ACIOL anterior chamber intraocular lens; IOL intraocular lens; PCIOL posterior chamber intraocular lens; Phaco/IOL phacoemulsification with intraocular lens placement; [place] photorefractive keratectomy; LASIK laser assisted in situ keratomileusis; HTN hypertension; DM diabetes mellitus; COPD chronic obstructive pulmonary disease

## 2020-04-19 DIAGNOSIS — M6281 Muscle weakness (generalized): Secondary | ICD-10-CM | POA: Diagnosis not present

## 2020-04-19 DIAGNOSIS — M4807 Spinal stenosis, lumbosacral region: Secondary | ICD-10-CM | POA: Diagnosis not present

## 2020-04-19 DIAGNOSIS — M545 Low back pain: Secondary | ICD-10-CM | POA: Diagnosis not present

## 2020-04-19 DIAGNOSIS — E1151 Type 2 diabetes mellitus with diabetic peripheral angiopathy without gangrene: Secondary | ICD-10-CM | POA: Diagnosis not present

## 2020-04-19 DIAGNOSIS — B351 Tinea unguium: Secondary | ICD-10-CM | POA: Diagnosis not present

## 2020-04-19 DIAGNOSIS — M5136 Other intervertebral disc degeneration, lumbar region: Secondary | ICD-10-CM | POA: Diagnosis not present

## 2020-04-20 ENCOUNTER — Encounter (INDEPENDENT_AMBULATORY_CARE_PROVIDER_SITE_OTHER): Payer: Self-pay | Admitting: Ophthalmology

## 2020-04-20 ENCOUNTER — Other Ambulatory Visit: Payer: Self-pay

## 2020-04-20 ENCOUNTER — Ambulatory Visit (INDEPENDENT_AMBULATORY_CARE_PROVIDER_SITE_OTHER): Payer: Medicare HMO | Admitting: Ophthalmology

## 2020-04-20 DIAGNOSIS — Z961 Presence of intraocular lens: Secondary | ICD-10-CM | POA: Diagnosis not present

## 2020-04-20 DIAGNOSIS — I10 Essential (primary) hypertension: Secondary | ICD-10-CM | POA: Diagnosis not present

## 2020-04-20 DIAGNOSIS — H3581 Retinal edema: Secondary | ICD-10-CM

## 2020-04-20 DIAGNOSIS — H35033 Hypertensive retinopathy, bilateral: Secondary | ICD-10-CM | POA: Diagnosis not present

## 2020-04-20 DIAGNOSIS — E113313 Type 2 diabetes mellitus with moderate nonproliferative diabetic retinopathy with macular edema, bilateral: Secondary | ICD-10-CM | POA: Diagnosis not present

## 2020-04-22 MED ORDER — AFLIBERCEPT 2MG/0.05ML IZ SOLN FOR KALEIDOSCOPE
2.0000 mg | INTRAVITREAL | Status: AC | PRN
  Administered 2020-04-22: 2 mg via INTRAVITREAL

## 2020-05-14 ENCOUNTER — Telehealth: Payer: Self-pay

## 2020-05-14 NOTE — Telephone Encounter (Signed)
Pt called requesting some strips be ordered. Please call pt to confirm. thanks

## 2020-05-16 DIAGNOSIS — Z794 Long term (current) use of insulin: Secondary | ICD-10-CM | POA: Diagnosis not present

## 2020-05-16 DIAGNOSIS — E1142 Type 2 diabetes mellitus with diabetic polyneuropathy: Secondary | ICD-10-CM | POA: Diagnosis not present

## 2020-05-16 DIAGNOSIS — N183 Chronic kidney disease, stage 3 unspecified: Secondary | ICD-10-CM | POA: Diagnosis not present

## 2020-05-16 DIAGNOSIS — E1122 Type 2 diabetes mellitus with diabetic chronic kidney disease: Secondary | ICD-10-CM | POA: Diagnosis not present

## 2020-05-17 ENCOUNTER — Other Ambulatory Visit: Payer: Self-pay

## 2020-05-17 ENCOUNTER — Telehealth: Payer: Self-pay

## 2020-05-17 DIAGNOSIS — I739 Peripheral vascular disease, unspecified: Secondary | ICD-10-CM

## 2020-05-17 DIAGNOSIS — I2510 Atherosclerotic heart disease of native coronary artery without angina pectoris: Secondary | ICD-10-CM

## 2020-05-17 MED ORDER — ISOSORBIDE MONONITRATE ER 120 MG PO TB24: 120.0000 mg | ORAL_TABLET | Freq: Every day | ORAL | 2 refills | Status: DC

## 2020-05-17 NOTE — Telephone Encounter (Signed)
Pt called stating he can not get a refill w/[facility] pharmacy until Sept. He is requesting a refill for his Isosorbide med be sent to the [facility] on [address]. He has two pills left due to change in dosage he needs to take. Please call pt to confirm medication. Thanks

## 2020-05-17 NOTE — Telephone Encounter (Signed)
Has this been done?

## 2020-05-17 NOTE — Telephone Encounter (Signed)
Done I have send it to [facility]

## 2020-05-23 ENCOUNTER — Other Ambulatory Visit: Payer: Self-pay | Admitting: Cardiology

## 2020-05-24 NOTE — Progress Notes (Signed)
[facility] Note  05/28/2020     CHIEF COMPLAINT Patient presents for Retina Follow Up   HISTORY OF PRESENT ILLNESS: Justin [name] is a 86 y.o. male who presents to the clinic today for:   HPI    Retina Follow Up    Patient presents with  Diabetic Retinopathy.  In both eyes.  This started months ago.  Severity is moderate.  Duration of 5 weeks.  Since onset it is stable.  I, the attending physician,  performed the HPI with the patient and updated documentation appropriately.          Comments    86 y/o male pt here for 5 wk f/u for NPDR w/DME OU.  No change in [place] OU.  Denies pain, FOL.  Has floaters OU.  No gtts.  BS 160 this a.m.  A1C 7.1.       Last edited by [name], MD on 05/28/2020  4:49 PM. (History)    Pt states no change in vision, no new health problems   Referring physician: Gevena Cotton, MD [place],  [place] [address]  HISTORICAL INFORMATION:   Selected notes from the MEDICAL RECORD NUMBER Referred by Dr. [name] for concern of mac edema OU    CURRENT MEDICATIONS: No current outpatient medications on file. (Ophthalmic Drugs)   No current facility-administered medications for this visit. (Ophthalmic Drugs)   Current Outpatient Medications (Other)  Medication Sig  . ACCU-CHEK SMARTVIEW test strip   . Accu-Chek Softclix Lancets lancets   . acetaminophen (TYLENOL) 500 MG tablet Take 1,000 mg by mouth every 4 (four) hours as needed for mild pain.   Marland Kitchen AMITIZA 24 MCG capsule Take 24 mcg by mouth as needed for constipation.   Marland Kitchen aspirin EC 81 MG tablet Take 81 mg by mouth daily.  . Blood Glucose Monitoring Suppl (ACCU-CHEK GUIDE) w/Device KIT   . chlorthalidone (HYGROTON) 50 MG tablet Take 25 mg by mouth daily.   . clobetasol cream (TEMOVATE) 0.05 %   . clonazePAM (KLONOPIN) 0.5 MG tablet Take 0.5 mg by mouth daily as needed for anxiety.  . DULoxetine (CYMBALTA) 30 MG capsule   . ezetimibe  (ZETIA) 10 MG tablet Take 1 tablet (10 mg total) by mouth daily after supper.  . gabapentin (NEURONTIN) 300 MG capsule   . isosorbide mononitrate (IMDUR) 120 MG 24 hr tablet Take 1 tablet (120 mg total) by mouth daily.  Marland Kitchen JANUVIA 100 MG tablet Take 100 mg by mouth daily.   Marland Kitchen LANTUS SOLOSTAR 100 UNIT/ML Solostar Pen Inject 15-50 Units into the skin See admin instructions. Inject 50 units in the morning and 15 units in the evening  . LINZESS 145 MCG CAPS capsule   . metoprolol (LOPRESSOR) 50 MG tablet Take 25 mg by mouth 2 (two) times daily.  Marland Kitchen omeprazole (PRILOSEC) 40 MG capsule Take 40 mg by mouth daily.   . ramipril (ALTACE) 10 MG capsule   . simvastatin (ZOCOR) 20 MG tablet   . tamsulosin (FLOMAX) 0.4 MG CAPS capsule Take 0.4 mg by mouth daily.  . vitamin B-12 (CYANOCOBALAMIN) 1000 MCG tablet Take 1,000 mcg by mouth daily.  . vitamin B-6 (PYRIDOXINE) 25 MG tablet Take 25 mg by mouth daily.  [name] 2.5 MG TABS tablet TAKE 1 TABLET TWICE DAILY   No current facility-administered medications for this visit. (Other)      REVIEW OF SYSTEMS: ROS    Positive for: Gastrointestinal,  Genitourinary, Endocrine, Cardiovascular, Eyes   Negative for: Constitutional, Neurological, Skin, Musculoskeletal, HENT, Respiratory, Psychiatric, Allergic/Imm, Heme/Lymph   Last edited by Matthew Folks, COA on 05/28/2020  1:29 PM. (History)       ALLERGIES Allergies  Allergen Reactions  . Contrast Media [Iodinated Diagnostic Agents] Itching    PT STATES ALLERGY TO "CT DYE".  [name] [Red Dye] Swelling    CAT scan dye  . Ibuprofen Other (See Comments)    Upset stomach     PAST MEDICAL HISTORY Past Medical History:  Diagnosis Date  . Anxiety   . Arthritis   . Coronary artery disease   . Diabetes mellitus without complication ([place])   . Diabetic retinopathy ([place])    NPDR OU  . Diverticulitis   . Dyspnea   . GERD (gastroesophageal reflux disease)   . Hypercholesteremia   . Hypertension    . Hypertensive retinopathy    OU  . Pneumonia    as a child  . Prostate cancer ([place])   . UTI (lower urinary tract infection)    Past Surgical History:  Procedure Laterality Date  . ABDOMINAL SURGERY     for diverticulitis; also removed appendix  . CATARACT EXTRACTION    . CATARACT EXTRACTION, BILATERAL    . CHOLECYSTECTOMY N/A 10/12/2017   Procedure: LAPAROSCOPIC CHOLECYSTECTOMY;  Surgeon: [name], MD;  Location: [facility];  Service: General;  Laterality: N/A;  . CORONARY ARTERY BYPASS GRAFT  2009  . ERCP N/A 12/21/2018   Procedure: ENDOSCOPIC RETROGRADE CHOLANGIOPANCREATOGRAPHY (ERCP);  Surgeon: [name], MD;  Location: [facility];  Service: Endoscopy;  Laterality: N/A;  . ESOPHAGOGASTRODUODENOSCOPY (EGD) WITH PROPOFOL N/A 12/17/2018   Procedure: ESOPHAGOGASTRODUODENOSCOPY (EGD) WITH PROPOFOL;  Surgeon: [name], MD;  Location: [facility];  Service: Endoscopy;  Laterality: N/A;  . EUS Left 12/17/2018   Procedure: UPPER ENDOSCOPIC ULTRASOUND (EUS) LINEAR;  Surgeon: [name], MD;  Location: [facility];  Service: Endoscopy;  Laterality: Left;  . EYE SURGERY     "for bleeding in eye"  . HERNIA REPAIR    . LEFT HEART CATH AND CORONARY ANGIOGRAPHY N/A 11/04/2016   Procedure: Left Heart Cath and Coronary Angiography;  Surgeon: [name], MD;  Location: [facility];  Service: Cardiovascular;  Laterality: N/A;  . LOWER EXTREMITY ANGIOGRAPHY N/A 11/04/2016   Procedure: Lower Extremity Angiography;  Surgeon: [name], MD;  Location: [facility];  Service: Cardiovascular;  Laterality: N/A;  . LOWER EXTREMITY ANGIOGRAPHY N/A 01/03/2020   Procedure: LOWER EXTREMITY ANGIOGRAPHY;  Surgeon: [name], MD;  Location: [facility];  Service: Cardiovascular;  Laterality: N/A;  . PROSTATE SURGERY    . REMOVAL OF STONES  12/21/2018   Procedure: REMOVAL OF STONES;  Surgeon: [name], MD;  Location: [facility];  Service: Endoscopy;;  . [name]   12/21/2018   Procedure: [name];  Surgeon: [name], MD;  Location: [facility];  Service: Endoscopy;;    FAMILY HISTORY Family History  Problem Relation Age of Onset  . Diabetes Father   . Diabetes Maternal Aunt   . Diabetes Maternal Uncle   . Diabetes Maternal Grandmother   . Heart disease Sister   . Diabetes Brother   . Heart disease Sister     SOCIAL HISTORY Social History   Tobacco Use  . Smoking status: Former Smoker    Packs/day: 0.25    Years: 2.00    Pack years: 0.50    Types: Cigarettes  . Smokeless tobacco:  Never Used  . Tobacco comment: when he was a teenage age [age]  Vaping Use  . Vaping Use: Never used  Substance Use Topics  . Alcohol use: No  . Drug use: No         OPHTHALMIC EXAM:  Base Eye Exam    Visual Acuity (Snellen - Linear)      Right Left   Dist cc 20/30 20/30 -2   Dist ph cc NI NI   Correction: Glasses       Tonometry (Tonopen, 1:31 PM)      Right Left   Pressure 11 12       Pupils      Dark Light Shape React APD   Right 3 2 Round Brisk None   Left 3 2 Round Brisk None       Visual Fields (Counting fingers)      Left Right    Full Full       Extraocular Movement      Right Left    Full, Ortho Full, Ortho       Neuro/Psych    Oriented x3: Yes   Mood/Affect: Normal       Dilation    Both eyes: 1.0% Mydriacyl, 2.5% Phenylephrine @ 1:31 PM        Slit Lamp and Fundus Exam    Slit Lamp Exam      Right Left   Lids/Lashes Dermatochalasis - upper lid, Meibomian gland dysfunction Dermatochalasis - upper lid, Meibomian gland dysfunction   Conjunctiva/Sclera Melanosis Melanosis   Cornea Arcus, 1+ Punctate epithelial erosions Arcus, 1+ Punctate epithelial erosions   Anterior Chamber Deep and quiet Deep and quiet   Iris Mild Temporal Iris atrophy, Round and dilated, No NVI Mild Temporal Iris atrophy, Round and dilated, No NVI   Lens PC IOL in good position with mild aterior capsule fimosis PC IOL in good  position with mild aterior capsule fimosis   Vitreous Vitreous syneresis, Posterior vitreous detachment, vitreous condensations Vitreous syneresis, Posterior vitreous detachment, vitreous condensations       Fundus Exam      Right Left   Disc pallor with sharp rim, Peripapillary atrophy, Compact mild pallor, sharp rim, Tilted disc, mild temporal Temporal Peripapillary atrophy, Compact   C/D Ratio 0.2 0.3   Macula Blunted foveal reflex, Retinal pigment epithelial mottling and clumping, Microaneurysms -- improved, persistent central cystic changes  Blunted foveal reflex, focal area of RPE atrophy SN to fovea, Microaneurysms, Retinal pigment epithelial mottling, refractile Epiretinal membrane, central cystic changes - slightly improved   Vessels Vascular attenuation, Tortuous Tortuous, Vascular attenuation   Periphery Attached, scattered MA, good peripheral PRP 360 - room for posterior fill in if needed Attached, scattered IRH, Peripheral PRP 360--room for posterior fill in if needed          IMAGING AND PROCEDURES  Imaging and Procedures for _0 @  OCT, Retina - OU - Both Eyes       Right Eye Quality was good. Central Foveal Thickness: 374. Progression has been stable. Findings include abnormal foveal contour, intraretinal fluid, no SRF, retinal drusen , outer retinal atrophy (Persistent central cystic changes -- no significant improvement from prior).   Left Eye Quality was good. Central Foveal Thickness: 255. Progression has improved. Findings include abnormal foveal contour, no SRF, intraretinal fluid, outer retinal atrophy, retinal drusen  (Mild interval improvement in cystic changes).   Notes *Images captured and stored on drive  Diagnosis / Impression:  DME OU, OD>OS OD:  Persistent central cystic changes, no significant improvement from prior OS: Mild interval improvement in cystic changes  Clinical management:  See below  Abbreviations: NFP - Normal foveal profile. CME  - cystoid macular edema. PED - pigment epithelial detachment. IRF - intraretinal fluid. SRF - subretinal fluid. EZ - ellipsoid zone. ERM - epiretinal membrane. ORA - outer retinal atrophy. ORT - outer retinal tubulation. SRHM - subretinal hyper-reflective material         Intravitreal Injection, Pharmacologic Agent - OD - Right Eye       Time Out 05/28/2020. 2:27 PM. Confirmed correct patient, procedure, site, and patient consented.   Anesthesia Topical anesthesia was used. Anesthetic medications included Lidocaine 2%, Proparacaine 0.5%.   Procedure Preparation included 5% betadine to ocular surface, eyelid speculum. A (32g) needle was used.   Injection:  2 mg aflibercept Alfonse Flavors) SOLN   NDC: A3590391, Lot: [id number], Expiration date: 08/28/2020   Route: Intravitreal, Site: Right Eye, Waste: 0.05 mL  Post-op Post injection exam found visual acuity of at least counting fingers. The patient tolerated the procedure well. There were no complications. The patient received written and verbal post procedure care education. Post injection medications were not given.        Intravitreal Injection, Pharmacologic Agent - OS - Left Eye       Time Out 05/28/2020. 2:27 PM. Confirmed correct patient, procedure, site, and patient consented.   Anesthesia Topical anesthesia was used. Anesthetic medications included Lidocaine 2%, Proparacaine 0.5%.   Procedure Preparation included 5% betadine to ocular surface, eyelid speculum. A (32g) needle was used.   Injection:  2 mg aflibercept Alfonse Flavors) SOLN   NDC: M7179715, Lot: [id number], Expiration date: 03/28/2021   Route: Intravitreal, Site: Left Eye, Waste: 0.05 mL  Post-op Post injection exam found visual acuity of at least counting fingers. The patient tolerated the procedure well. There were no complications. The patient received written and verbal post procedure care education. Post injection medications were not given.                  ASSESSMENT/PLAN:    ICD-10-CM   1. Moderate nonproliferative diabetic retinopathy of both eyes with macular edema associated with type 2 diabetes mellitus (HCC)  E93.8101 Intravitreal Injection, Pharmacologic Agent - OD - Right Eye    Intravitreal Injection, Pharmacologic Agent - OS - Left Eye    aflibercept (EYLEA) SOLN 2 mg    aflibercept (EYLEA) SOLN 2 mg  2. Retinal edema  H35.81 OCT, Retina - OU - Both Eyes  3. Essential hypertension  I10   4. Hypertensive retinopathy of both eyes  H35.033   5. Pseudophakia of both eyes  Z96.1     1,2. Moderate non-proliferative diabetic retinopathy with edema OU   - moved to [place] from [place], [place] -- history of prior injections OS with Dr. [name]  - records received and reviewed from [facility] (Dr. [name])  - history of focal laser OS x2 and IVK/IVT OU in [place] -- last injection was IVK OS November 2013  - initial exam: scattered IRH, DBH and +macular edema  - initial OCT: diabetic macular edema, both eyes, (OD > OS)  - FA (10.1.19) showed late leaking MA OU -- no NV  - delayed f/u on 4.16.20 due to hospitalization for sepsis / gallstones  - S/P PRP OD (07.14.20) - good laser surrounding, room for posterior fill in if needed  - S/P PRP OS (08.26.20)  - S/P IVA OD #1 (09.17.19), #2 (10.29.19), #  3 (11.26.19), #4 (01.02.20), #5 (01.30.20), #6 (02.28.20), #7 (04.16.20), #8 (06.11.20), #9 (08.12.20), #10 (01.11.21)  - S/P IVA OS #1 (10.01.19), #2 (10.29.19), #3 (11.26.19), #4 (01.02.20), #5 (01.30.20), #6 (02.28.20), #7 (04.16.20), #8 (05.14.20), #9 (06.11.20), #10 (07.09.20), #11 (08.12.20), #12 (01.11.21)  - S/P IVK OD #1 (05.14.20) -- no significant improvement  - switched back to Avastin OD (#9 6.11.2020)  - repeated Eylea4U benefits investigation due to possible IVA resistance (8.12.20)  - S/P IVE OS #1 (09.16.20), #2 (10.16.20), #3 (11.13.20), #4 (12.11.20), #5 (02.08.21), #6 (03.12.21), #7 (04.16.21), #8  (05.14.21), #9 (06.18.21), #10 (07.23.21)  - S/P IVE OD #1 (09.16.20), #2 (10.16.20), #3 (11.13.20), #4 (12.11.20), #5 (02.08.21), #6 (03.12.21), #7 (04.16.21), #8 (05.14.21), #9 (06.18.21), #10 (07.23.21)  - OCT today shows mild interval improvement in cystic changes OS; OD persistent central cysts  - BCVA: OD stable at 20/30 OU  - recommend IVE OU #11 today, 08.30.21   - Eylea4U benefits investigation completed and verified for 2021 as of 02.08.21  - pt wishes to proceed  - RBA of procedure discussed, questions answered  - informed consent obtained  - see procedure note  - Eylea informed consent form signed and scanned on 12.11.2020  - f/u 5 weeks, DFE, OCT, possible injection  3,4. Hypertensive retinopathy OU  - discussed importance of tight BP control  - monitor  5. Pseudophakia OU  - s/p CE/IOL OU in [place], [place]  - beautiful surgeries, doing well  - monitor   Ophthalmic Meds Ordered this visit:  Meds ordered this encounter  Medications  . aflibercept (EYLEA) SOLN 2 mg  . aflibercept (EYLEA) SOLN 2 mg       Return in about 5 weeks (around 07/02/2020) for f/u NPDR OU, DFE, OCT.  There are no Patient Instructions on file for this visit.  This document serves as a record of services personally performed by Gardiner Sleeper, MD, PhD. It was created on their behalf by Leeann Must, [place], an ophthalmic technician. The creation of this record is the provider's dictation and/or activities during the visit.    Electronically signed by: Leeann Must, COA _0 @ 4:52 PM   This document serves as a record of services personally performed by Gardiner Sleeper, MD, PhD. It was created on their behalf by San Jetty. Owens Shark, OA an ophthalmic technician. The creation of this record is the provider's dictation and/or activities during the visit.    Electronically signed by: San Jetty. Owens Shark, [place] 08.30.2021 4:52 PM  Gardiner Sleeper, M.D., Ph.D. Diseases & Surgery of the Retina and  [place] 05/28/2020   I have reviewed the above documentation for accuracy and completeness, and I agree with the above. Gardiner Sleeper, M.D., Ph.D. 05/28/20 4:52 PM   Abbreviations: M myopia (nearsighted); A astigmatism; H hyperopia (farsighted); P presbyopia; Mrx spectacle prescription;  CTL contact lenses; OD right eye; OS left eye; OU both eyes  XT exotropia; ET esotropia; PEK punctate epithelial keratitis; PEE punctate epithelial erosions; DES dry eye syndrome; MGD meibomian gland dysfunction; ATs artificial tears; PFAT's preservative free artificial tears; Berry nuclear sclerotic cataract; PSC posterior subcapsular cataract; ERM epi-retinal membrane; PVD posterior vitreous detachment; RD retinal detachment; DM diabetes mellitus; DR diabetic retinopathy; NPDR non-proliferative diabetic retinopathy; PDR proliferative diabetic retinopathy; CSME clinically significant macular edema; DME diabetic macular edema; dbh dot blot hemorrhages; CWS cotton wool spot; POAG primary open angle glaucoma; C/D cup-to-disc ratio; HVF humphrey visual field; GVF goldmann visual field; OCT  optical coherence tomography; IOP intraocular pressure; BRVO Branch retinal vein occlusion; CRVO central retinal vein occlusion; CRAO central retinal artery occlusion; BRAO branch retinal artery occlusion; RT retinal tear; SB scleral buckle; PPV pars plana vitrectomy; VH Vitreous hemorrhage; PRP panretinal laser photocoagulation; IVK intravitreal kenalog; VMT vitreomacular traction; MH Macular hole;  NVD neovascularization of the disc; NVE neovascularization elsewhere; AREDS age related eye disease study; ARMD age related macular degeneration; POAG primary open angle glaucoma; EBMD epithelial/anterior basement membrane dystrophy; ACIOL anterior chamber intraocular lens; IOL intraocular lens; PCIOL posterior chamber intraocular lens; Phaco/IOL phacoemulsification with intraocular lens placement; [place]  photorefractive keratectomy; LASIK laser assisted in situ keratomileusis; HTN hypertension; DM diabetes mellitus; COPD chronic obstructive pulmonary disease

## 2020-05-28 ENCOUNTER — Encounter (INDEPENDENT_AMBULATORY_CARE_PROVIDER_SITE_OTHER): Payer: Self-pay | Admitting: Ophthalmology

## 2020-05-28 ENCOUNTER — Ambulatory Visit (INDEPENDENT_AMBULATORY_CARE_PROVIDER_SITE_OTHER): Payer: Medicare HMO | Admitting: Ophthalmology

## 2020-05-28 ENCOUNTER — Other Ambulatory Visit: Payer: Self-pay

## 2020-05-28 DIAGNOSIS — H35033 Hypertensive retinopathy, bilateral: Secondary | ICD-10-CM | POA: Diagnosis not present

## 2020-05-28 DIAGNOSIS — H3581 Retinal edema: Secondary | ICD-10-CM

## 2020-05-28 DIAGNOSIS — E113313 Type 2 diabetes mellitus with moderate nonproliferative diabetic retinopathy with macular edema, bilateral: Secondary | ICD-10-CM

## 2020-05-28 DIAGNOSIS — Z961 Presence of intraocular lens: Secondary | ICD-10-CM | POA: Diagnosis not present

## 2020-05-28 DIAGNOSIS — I10 Essential (primary) hypertension: Secondary | ICD-10-CM | POA: Diagnosis not present

## 2020-05-28 MED ORDER — AFLIBERCEPT 2MG/0.05ML IZ SOLN FOR KALEIDOSCOPE
2.0000 mg | INTRAVITREAL | Status: AC | PRN
  Administered 2020-05-28: 2 mg via INTRAVITREAL

## 2020-06-21 ENCOUNTER — Encounter: Payer: Self-pay | Admitting: Cardiology

## 2020-06-28 NOTE — Progress Notes (Signed)
[facility] Note  07/03/2020     CHIEF COMPLAINT Patient presents for Retina Follow Up   HISTORY OF PRESENT ILLNESS: Justin [name] is a 86 y.o. male who presents to the clinic today for:   HPI    Retina Follow Up    Patient presents with  Diabetic Retinopathy.  In both eyes.  Severity is moderate.  Duration of 5.  Since onset it is stable.  I, the attending physician,  performed the HPI with the patient and updated documentation appropriately.          Comments    5 week follow up NPDR OU- Vision about the same as list visit.  Eyes have been tearing and itching OU.  Denies using eye drops.        Last edited by [name], MD on 07/03/2020  4:24 PM. (History)    pt states his vision is "about the same as it was"   Referring physician: [name], MD [address] [place],  [place] [id number]  HISTORICAL INFORMATION:   Selected notes from the [facility] Referred by Dr. [name] for concern of mac edema OU    CURRENT MEDICATIONS: No current outpatient medications on file. (Ophthalmic Drugs)   No current facility-administered medications for this visit. (Ophthalmic Drugs)   Current Outpatient Medications (Other)  Medication Sig  . ACCU-CHEK SMARTVIEW test strip   . Accu-Chek Softclix Lancets lancets   . acetaminophen (TYLENOL) 500 MG tablet Take 1,000 mg by mouth every 4 (four) hours as needed for mild pain.   Marland Kitchen AMITIZA 24 MCG capsule Take 24 mcg by mouth as needed for constipation.   Marland Kitchen aspirin EC 81 MG tablet Take 81 mg by mouth daily.  . Blood Glucose Monitoring Suppl (ACCU-CHEK GUIDE) w/Device KIT   . chlorthalidone (HYGROTON) 50 MG tablet Take 25 mg by mouth daily.   . clobetasol cream (TEMOVATE) 0.05 %   . clonazePAM (KLONOPIN) 0.5 MG tablet Take 0.5 mg by mouth daily as needed for anxiety.  . DULoxetine (CYMBALTA) 30 MG capsule   . ezetimibe (ZETIA) 10 MG tablet Take 1 tablet (10 mg total) by mouth  daily after supper.  . gabapentin (NEURONTIN) 300 MG capsule   . isosorbide mononitrate (IMDUR) 120 MG 24 hr tablet Take 1 tablet (120 mg total) by mouth daily.  Marland Kitchen JANUVIA 100 MG tablet Take 100 mg by mouth daily.   Marland Kitchen LANTUS SOLOSTAR 100 UNIT/ML Solostar Pen Inject 15-50 Units into the skin See admin instructions. Inject 50 units in the morning and 15 units in the evening  . LINZESS 145 MCG CAPS capsule   . metoprolol (LOPRESSOR) 50 MG tablet Take 25 mg by mouth 2 (two) times daily.  Marland Kitchen omeprazole (PRILOSEC) 40 MG capsule Take 40 mg by mouth daily.   . ramipril (ALTACE) 10 MG capsule   . simvastatin (ZOCOR) 20 MG tablet   . tamsulosin (FLOMAX) 0.4 MG CAPS capsule Take 0.4 mg by mouth daily.  . vitamin B-12 (CYANOCOBALAMIN) 1000 MCG tablet Take 1,000 mcg by mouth daily.  . vitamin B-6 (PYRIDOXINE) 25 MG tablet Take 25 mg by mouth daily.  [name] 2.5 MG TABS tablet TAKE 1 TABLET TWICE DAILY   No current facility-administered medications for this visit. (Other)      REVIEW OF SYSTEMS: ROS    Positive for: Gastrointestinal, Genitourinary, Endocrine, Cardiovascular, Eyes   Negative for: Constitutional, Neurological, Skin, Musculoskeletal, HENT, Respiratory, Psychiatric, Allergic/Imm,  Heme/Lymph   Last edited by Leonie Douglas, COA on 07/03/2020  1:21 PM. (History)       ALLERGIES Allergies  Allergen Reactions  . Contrast Media [Iodinated Diagnostic Agents] Itching    PT STATES ALLERGY TO "CT DYE".  [name] [Red Dye] Swelling    CAT scan dye  . Ibuprofen Other (See Comments)    Upset stomach     PAST MEDICAL HISTORY Past Medical History:  Diagnosis Date  . Anxiety   . Arthritis   . Coronary artery disease   . Diabetes mellitus without complication ([place])   . Diabetic retinopathy ([place])    NPDR OU  . Diverticulitis   . Dyspnea   . GERD (gastroesophageal reflux disease)   . Hypercholesteremia   . Hypertension   . Hypertensive retinopathy    OU  . Pneumonia    as a  child  . Prostate cancer ([place])   . UTI (lower urinary tract infection)    Past Surgical History:  Procedure Laterality Date  . ABDOMINAL SURGERY     for diverticulitis; also removed appendix  . CATARACT EXTRACTION    . CATARACT EXTRACTION, BILATERAL    . CHOLECYSTECTOMY N/A 10/12/2017   Procedure: LAPAROSCOPIC CHOLECYSTECTOMY;  Surgeon: [name], MD;  Location: [facility];  Service: General;  Laterality: N/A;  . CORONARY ARTERY BYPASS GRAFT  2009  . ERCP N/A 12/21/2018   Procedure: ENDOSCOPIC RETROGRADE CHOLANGIOPANCREATOGRAPHY (ERCP);  Surgeon: [name], MD;  Location: [facility];  Service: Endoscopy;  Laterality: N/A;  . ESOPHAGOGASTRODUODENOSCOPY (EGD) WITH PROPOFOL N/A 12/17/2018   Procedure: ESOPHAGOGASTRODUODENOSCOPY (EGD) WITH PROPOFOL;  Surgeon: [name], MD;  Location: [facility];  Service: Endoscopy;  Laterality: N/A;  . EUS Left 12/17/2018   Procedure: UPPER ENDOSCOPIC ULTRASOUND (EUS) LINEAR;  Surgeon: [name], MD;  Location: [facility];  Service: Endoscopy;  Laterality: Left;  . EYE SURGERY     "for bleeding in eye"  . HERNIA REPAIR    . LEFT HEART CATH AND CORONARY ANGIOGRAPHY N/A 11/04/2016   Procedure: Left Heart Cath and Coronary Angiography;  Surgeon: [name], MD;  Location: [facility];  Service: Cardiovascular;  Laterality: N/A;  . LOWER EXTREMITY ANGIOGRAPHY N/A 11/04/2016   Procedure: Lower Extremity Angiography;  Surgeon: [name], MD;  Location: [facility];  Service: Cardiovascular;  Laterality: N/A;  . LOWER EXTREMITY ANGIOGRAPHY N/A 01/03/2020   Procedure: LOWER EXTREMITY ANGIOGRAPHY;  Surgeon: [name], MD;  Location: [facility];  Service: Cardiovascular;  Laterality: N/A;  . PROSTATE SURGERY    . REMOVAL OF STONES  12/21/2018   Procedure: REMOVAL OF STONES;  Surgeon: [name], MD;  Location: [facility];  Service: Endoscopy;;  . [name]  12/21/2018   Procedure: [name];  Surgeon: [name],  MD;  Location: [facility];  Service: Endoscopy;;    FAMILY HISTORY Family History  Problem Relation Age of Onset  . Diabetes Father   . Diabetes Maternal Aunt   . Diabetes Maternal Uncle   . Diabetes Maternal Grandmother   . Heart disease Sister   . Diabetes Brother   . Heart disease Sister     SOCIAL HISTORY Social History   Tobacco Use  . Smoking status: Former Smoker    Packs/day: 0.25    Years: 2.00    Pack years: 0.50    Types: Cigarettes  . Smokeless tobacco: Never Used  . Tobacco comment: when he was a teenage age [age]  Vaping Use  .  Vaping Use: Never used  Substance Use Topics  . Alcohol use: No  . Drug use: No         OPHTHALMIC EXAM:  Base Eye Exam    Visual Acuity (Snellen - Linear)      Right Left   Dist cc 20/30 20/50   Dist ph cc NI 20/30   Correction: Glasses       Tonometry (Tonopen, 1:28 PM)      Right Left   Pressure 13 13       Pupils      Dark Light Shape React APD   Right 3 2 Round Brisk None   Left 3 2 Round Brisk None       Visual Fields (Counting fingers)      Left Right    Full Full       Extraocular Movement      Right Left    Full Full       Neuro/Psych    Oriented x3: Yes   Mood/Affect: Normal       Dilation    Both eyes: 1.0% Mydriacyl, 2.5% Phenylephrine @ 1:28 PM        Slit Lamp and Fundus Exam    Slit Lamp Exam      Right Left   Lids/Lashes Dermatochalasis - upper lid, Meibomian gland dysfunction Dermatochalasis - upper lid, Meibomian gland dysfunction   Conjunctiva/Sclera Melanosis Melanosis   Cornea Arcus, 1+ Punctate epithelial erosions Arcus, 1+ Punctate epithelial erosions   Anterior Chamber Deep and quiet Deep and quiet   Iris Mild Temporal Iris atrophy, Round and dilated, No NVI Mild Temporal Iris atrophy, Round and dilated, No NVI   Lens PC IOL in good position with mild aterior capsule fimosis PC IOL in good position with mild aterior capsule fimosis   Vitreous Vitreous syneresis,  Posterior vitreous detachment, vitreous condensations Vitreous syneresis, Posterior vitreous detachment, vitreous condensations       Fundus Exam      Right Left   Disc pallor with sharp rim, Peripapillary atrophy, Compact mild pallor, sharp rim, Tilted disc, mild temporal Temporal Peripapillary atrophy, Compact   C/D Ratio 0.2 0.3   Macula Blunted foveal reflex, Retinal pigment epithelial mottling and clumping, Microaneurysms -- improved, persistent central cystic changes  Blunted foveal reflex, focal area of RPE atrophy SN to fovea, Microaneurysms, Retinal pigment epithelial mottling, refractile Epiretinal membrane, central cystic changes - slightly improved   Vessels Vascular attenuation, Tortuous Tortuous, Vascular attenuation greater superiorly   Periphery Attached, rare, scattered MA, good peripheral PRP 360 - room for posterior fill in if needed Attached, rare, scattered MA, Peripheral PRP 360--room for posterior fill in if needed          IMAGING AND PROCEDURES  Imaging and Procedures for _0 @  OCT, Retina - OU - Both Eyes       Right Eye Quality was good. Central Foveal Thickness: 378. Progression has been stable. Findings include abnormal foveal contour, intraretinal fluid, no SRF, retinal drusen , outer retinal atrophy (Persistent central cystic changes -- minimal from prior).   Left Eye Quality was good. Central Foveal Thickness: 258. Progression has improved. Findings include abnormal foveal contour, no SRF, intraretinal fluid, outer retinal atrophy, retinal drusen  (Mild interval improvement in cystic changes).   Notes *Images captured and stored on drive  Diagnosis / Impression:  DME OU, OD>OS OD: Persistent central cystic changes, no significant improvement from prior OS: Mild interval improvement in cystic changes  Clinical management:  See below  Abbreviations: NFP - Normal foveal profile. CME - cystoid macular edema. PED - pigment epithelial detachment.  IRF - intraretinal fluid. SRF - subretinal fluid. EZ - ellipsoid zone. ERM - epiretinal membrane. ORA - outer retinal atrophy. ORT - outer retinal tubulation. SRHM - subretinal hyper-reflective material         Intravitreal Injection, Pharmacologic Agent - OD - Right Eye       Time Out 07/03/2020. 2:06 PM. Confirmed correct patient, procedure, site, and patient consented.   Anesthesia Topical anesthesia was used. Anesthetic medications included Lidocaine 2%, Proparacaine 0.5%.   Procedure Preparation included 5% betadine to ocular surface, eyelid speculum. A (32g) needle was used.   Injection:  2 mg aflibercept Alfonse Flavors) SOLN   NDC: A3590391, Lot: [id number], Expiration date: 09/29/2020   Route: Intravitreal, Site: Right Eye, Waste: 0.05 mg  Post-op Post injection exam found visual acuity of at least counting fingers. The patient tolerated the procedure well. There were no complications. The patient received written and verbal post procedure care education. Post injection medications were not given.        Intravitreal Injection, Pharmacologic Agent - OS - Left Eye       Time Out 07/03/2020. 2:06 PM. Confirmed correct patient, procedure, site, and patient consented.   Anesthesia Topical anesthesia was used. Anesthetic medications included Lidocaine 2%, Proparacaine 0.5%.   Procedure Preparation included 5% betadine to ocular surface, eyelid speculum. A (32g) needle was used.   Injection:  2 mg aflibercept Alfonse Flavors) SOLN   NDC: A3590391, Lot: [id number], Expiration date: 08/29/2020   Route: Intravitreal, Site: Left Eye, Waste: 0.05 mL  Post-op Post injection exam found visual acuity of at least counting fingers. The patient tolerated the procedure well. There were no complications. The patient received written and verbal post procedure care education. Post injection medications were not given.                 ASSESSMENT/PLAN:    ICD-10-CM   1. Moderate  nonproliferative diabetic retinopathy of both eyes with macular edema associated with type 2 diabetes mellitus (HCC)  U23.5361 Intravitreal Injection, Pharmacologic Agent - OD - Right Eye    Intravitreal Injection, Pharmacologic Agent - OS - Left Eye    aflibercept (EYLEA) SOLN 2 mg    aflibercept (EYLEA) SOLN 2 mg  2. Retinal edema  H35.81 OCT, Retina - OU - Both Eyes  3. Essential hypertension  I10   4. Hypertensive retinopathy of both eyes  H35.033   5. Pseudophakia of both eyes  Z96.1     1,2. Moderate non-proliferative diabetic retinopathy with edema OU   - moved to [place] from [place], [place] -- history of prior injections OS with Dr. [name]  - records received and reviewed from [facility] (Dr. [name])  - history of focal laser OS x2 and IVK/IVT OU in [place] -- last injection was IVK OS November 2013  - initial exam: scattered IRH, DBH and +macular edema  - initial OCT: diabetic macular edema, both eyes, (OD > OS)  - FA (10.1.19) showed late leaking MA OU -- no NV  - delayed f/u on 4.16.20 due to hospitalization for sepsis / gallstones  - S/P PRP OD (07.14.20) - good laser surrounding, room for posterior fill in if needed  - S/P PRP OS (08.26.20)  - S/P IVA OD #1 (09.17.19), #2 (10.29.19), #3 (11.26.19), #4 (01.02.20), #5 (01.30.20), #6 (02.28.20), #7 (04.16.20), #8 (06.11.20), #9 (08.12.20), #10 (01.11.21)  - S/P IVA  OS #1 (10.01.19), #2 (10.29.19), #3 (11.26.19), #4 (01.02.20), #5 (01.30.20), #6 (02.28.20), #7 (04.16.20), #8 (05.14.20), #9 (06.11.20), #10 (07.09.20), #11 (08.12.20), #12 (01.11.21)  - S/P IVK OD #1 (05.14.20) -- no significant improvement  - switched back to Avastin OD (#9 6.11.2020)  - repeated Eylea4U benefits investigation due to possible IVA resistance (8.12.20)  - S/P IVE OS #1 (09.16.20), #2 (10.16.20), #3 (11.13.20), #4 (12.11.20), #5 (02.08.21), #6 (03.12.21), #7 (04.16.21), #8 (05.14.21), #9 (06.18.21), #10 (07.23.21), #11 (8.30.21)  - S/P  IVE OD #1 (09.16.20), #2 (10.16.20), #3 (11.13.20), #4 (12.11.20), #5 (02.08.21), #6 (03.12.21), #7 (04.16.21), #8 (05.14.21), #9 (06.18.21), #10 (07.23.21), #11 (8.30.21)  - OCT today shows mild interval improvement in cystic changes OS; OD persistent central cysts  - BCVA stable at 20/30 OU  - recommend IVE OU #12 today, 10.05.21  - Eylea4U benefits investigation completed and verified for 2021 as of 02.08.21  - pt wishes to proceed  - RBA of procedure discussed, questions answered  - informed consent obtained  - see procedure note  - Eylea informed consent form signed and scanned on 12.11.2020  - f/u 5 weeks, DFE, OCT, possible injection  3,4. Hypertensive retinopathy OU  - discussed importance of tight BP control  - monitor  5. Pseudophakia OU  - s/p CE/IOL OU in [place], [place]  - beautiful surgeries, doing well  - monitor   Ophthalmic Meds Ordered this visit:  Meds ordered this encounter  Medications  . aflibercept (EYLEA) SOLN 2 mg  . aflibercept (EYLEA) SOLN 2 mg       Return in about 5 weeks (around 08/07/2020) for f/u NPDR OU, DFE, OCT.  There are no Patient Instructions on file for this visit.  This document serves as a record of services personally performed by Gardiner Sleeper, MD, PhD. It was created on their behalf by Estill Bakes, COT an ophthalmic technician. The creation of this record is the provider's dictation and/or activities during the visit.    Electronically signed by: Estill Bakes, COT 9.30.21 @ 4:26 PM   This document serves as a record of services personally performed by Gardiner Sleeper, MD, PhD. It was created on their behalf by San Jetty. Owens Shark, OA an ophthalmic technician. The creation of this record is the provider's dictation and/or activities during the visit.    Electronically signed by: San Jetty. Owens Shark, [place] 10.05.2021 4:26 PM  Gardiner Sleeper, M.D., Ph.D. Diseases & Surgery of the Retina and [place] 07/03/2020   I have reviewed the above documentation for accuracy and completeness, and I agree with the above. Gardiner Sleeper, M.D., Ph.D. 07/03/20 4:27 PM  Abbreviations: M myopia (nearsighted); A astigmatism; H hyperopia (farsighted); P presbyopia; Mrx spectacle prescription;  CTL contact lenses; OD right eye; OS left eye; OU both eyes  XT exotropia; ET esotropia; PEK punctate epithelial keratitis; PEE punctate epithelial erosions; DES dry eye syndrome; MGD meibomian gland dysfunction; ATs artificial tears; PFAT's preservative free artificial tears; [place] nuclear sclerotic cataract; PSC posterior subcapsular cataract; ERM epi-retinal membrane; PVD posterior vitreous detachment; RD retinal detachment; DM diabetes mellitus; DR diabetic retinopathy; NPDR non-proliferative diabetic retinopathy; PDR proliferative diabetic retinopathy; CSME clinically significant macular edema; DME diabetic macular edema; dbh dot blot hemorrhages; CWS cotton wool spot; POAG primary open angle glaucoma; C/D cup-to-disc ratio; HVF humphrey visual field; GVF goldmann visual field; OCT optical coherence tomography; IOP intraocular pressure; BRVO Branch retinal vein occlusion; CRVO central retinal vein occlusion; CRAO central  retinal artery occlusion; BRAO branch retinal artery occlusion; RT retinal tear; SB scleral buckle; PPV pars plana vitrectomy; VH Vitreous hemorrhage; PRP panretinal laser photocoagulation; IVK intravitreal kenalog; VMT vitreomacular traction; MH Macular hole;  NVD neovascularization of the disc; NVE neovascularization elsewhere; AREDS age related eye disease study; ARMD age related macular degeneration; POAG primary open angle glaucoma; EBMD epithelial/anterior basement membrane dystrophy; ACIOL anterior chamber intraocular lens; IOL intraocular lens; PCIOL posterior chamber intraocular lens; Phaco/IOL phacoemulsification with intraocular lens placement; [place] photorefractive keratectomy; LASIK laser  assisted in situ keratomileusis; HTN hypertension; DM diabetes mellitus; COPD chronic obstructive pulmonary disease

## 2020-07-03 ENCOUNTER — Other Ambulatory Visit: Payer: Self-pay

## 2020-07-03 ENCOUNTER — Encounter (INDEPENDENT_AMBULATORY_CARE_PROVIDER_SITE_OTHER): Payer: Self-pay | Admitting: Ophthalmology

## 2020-07-03 ENCOUNTER — Ambulatory Visit (INDEPENDENT_AMBULATORY_CARE_PROVIDER_SITE_OTHER): Payer: Medicare HMO | Admitting: Ophthalmology

## 2020-07-03 DIAGNOSIS — H35033 Hypertensive retinopathy, bilateral: Secondary | ICD-10-CM

## 2020-07-03 DIAGNOSIS — H3581 Retinal edema: Secondary | ICD-10-CM | POA: Diagnosis not present

## 2020-07-03 DIAGNOSIS — I10 Essential (primary) hypertension: Secondary | ICD-10-CM

## 2020-07-03 DIAGNOSIS — Z961 Presence of intraocular lens: Secondary | ICD-10-CM | POA: Diagnosis not present

## 2020-07-03 DIAGNOSIS — E113313 Type 2 diabetes mellitus with moderate nonproliferative diabetic retinopathy with macular edema, bilateral: Secondary | ICD-10-CM | POA: Diagnosis not present

## 2020-07-03 MED ORDER — AFLIBERCEPT 2MG/0.05ML IZ SOLN FOR KALEIDOSCOPE
2.0000 mg | INTRAVITREAL | Status: AC | PRN
  Administered 2020-07-03: 2 mg via INTRAVITREAL

## 2020-07-05 DIAGNOSIS — M48061 Spinal stenosis, lumbar region without neurogenic claudication: Secondary | ICD-10-CM | POA: Diagnosis not present

## 2020-07-10 DIAGNOSIS — M5416 Radiculopathy, lumbar region: Secondary | ICD-10-CM | POA: Diagnosis not present

## 2020-07-18 DIAGNOSIS — I10 Essential (primary) hypertension: Secondary | ICD-10-CM | POA: Diagnosis not present

## 2020-07-18 DIAGNOSIS — N400 Enlarged prostate without lower urinary tract symptoms: Secondary | ICD-10-CM | POA: Diagnosis not present

## 2020-07-18 DIAGNOSIS — G629 Polyneuropathy, unspecified: Secondary | ICD-10-CM | POA: Diagnosis not present

## 2020-07-18 DIAGNOSIS — E782 Mixed hyperlipidemia: Secondary | ICD-10-CM | POA: Diagnosis not present

## 2020-07-18 DIAGNOSIS — E1165 Type 2 diabetes mellitus with hyperglycemia: Secondary | ICD-10-CM | POA: Diagnosis not present

## 2020-07-18 DIAGNOSIS — E1142 Type 2 diabetes mellitus with diabetic polyneuropathy: Secondary | ICD-10-CM | POA: Diagnosis not present

## 2020-07-18 DIAGNOSIS — K581 Irritable bowel syndrome with constipation: Secondary | ICD-10-CM | POA: Diagnosis not present

## 2020-07-18 DIAGNOSIS — F419 Anxiety disorder, unspecified: Secondary | ICD-10-CM | POA: Diagnosis not present

## 2020-07-18 DIAGNOSIS — R1013 Epigastric pain: Secondary | ICD-10-CM | POA: Diagnosis not present

## 2020-07-19 DIAGNOSIS — B351 Tinea unguium: Secondary | ICD-10-CM | POA: Diagnosis not present

## 2020-07-19 DIAGNOSIS — E1151 Type 2 diabetes mellitus with diabetic peripheral angiopathy without gangrene: Secondary | ICD-10-CM | POA: Diagnosis not present

## 2020-07-27 DIAGNOSIS — M5416 Radiculopathy, lumbar region: Secondary | ICD-10-CM | POA: Diagnosis not present

## 2020-07-28 ENCOUNTER — Ambulatory Visit: Payer: Medicare HMO

## 2020-08-07 ENCOUNTER — Encounter (INDEPENDENT_AMBULATORY_CARE_PROVIDER_SITE_OTHER): Payer: Medicare HMO | Admitting: Ophthalmology

## 2020-08-07 DIAGNOSIS — H3581 Retinal edema: Secondary | ICD-10-CM

## 2020-08-07 DIAGNOSIS — H35033 Hypertensive retinopathy, bilateral: Secondary | ICD-10-CM

## 2020-08-07 DIAGNOSIS — I10 Essential (primary) hypertension: Secondary | ICD-10-CM

## 2020-08-07 DIAGNOSIS — E113313 Type 2 diabetes mellitus with moderate nonproliferative diabetic retinopathy with macular edema, bilateral: Secondary | ICD-10-CM

## 2020-08-07 DIAGNOSIS — Z961 Presence of intraocular lens: Secondary | ICD-10-CM

## 2020-08-07 NOTE — Progress Notes (Signed)
[facility] Note  08/08/2020     CHIEF COMPLAINT Patient presents for Retina Follow Up   HISTORY OF PRESENT ILLNESS: Justin [name] is a 86 y.o. male who presents to the clinic today for:   HPI    Retina Follow Up    Patient presents with  Diabetic Retinopathy.  In both eyes.  This started 5 weeks ago.  I, the attending physician,  performed the HPI with the patient and updated documentation appropriately.          Comments    Patient here for 5 weeks retina follow up for NPDR OU. Patient states vision doing about the same. they water and itch. Goes next Monday for shots in hips.       Last edited by [name], MD on 08/09/2020 10:34 PM. (History)    pt states vision is "about the same"   Referring physician: [name], MD [address] [place],  [place] [address]  HISTORICAL INFORMATION:   Selected notes from the [facility] Referred by Dr. [name] for concern of mac edema OU    CURRENT MEDICATIONS: No current outpatient medications on file. (Ophthalmic Drugs)   No current facility-administered medications for this visit. (Ophthalmic Drugs)   Current Outpatient Medications (Other)  Medication Sig  . ACCU-CHEK SMARTVIEW test strip   . Accu-Chek Softclix Lancets lancets   . acetaminophen (TYLENOL) 500 MG tablet Take 1,000 mg by mouth every 4 (four) hours as needed for mild pain.   Marland Kitchen AMITIZA 24 MCG capsule Take 24 mcg by mouth as needed for constipation.   Marland Kitchen aspirin EC 81 MG tablet Take 81 mg by mouth daily.  . Blood Glucose Monitoring Suppl (ACCU-CHEK GUIDE) w/Device KIT   . chlorthalidone (HYGROTON) 50 MG tablet Take 25 mg by mouth daily.   . clobetasol cream (TEMOVATE) 0.05 %   . clonazePAM (KLONOPIN) 0.5 MG tablet Take 0.5 mg by mouth daily as needed for anxiety.  . DULoxetine (CYMBALTA) 30 MG capsule   . ezetimibe (ZETIA) 10 MG tablet Take 1 tablet (10 mg total) by mouth daily after supper.  .  gabapentin (NEURONTIN) 300 MG capsule   . isosorbide mononitrate (IMDUR) 120 MG 24 hr tablet Take 1 tablet (120 mg total) by mouth daily.  Marland Kitchen JANUVIA 100 MG tablet Take 100 mg by mouth daily.   Marland Kitchen LANTUS SOLOSTAR 100 UNIT/ML Solostar Pen Inject 15-50 Units into the skin See admin instructions. Inject 50 units in the morning and 15 units in the evening  . LINZESS 145 MCG CAPS capsule   . metoprolol (LOPRESSOR) 50 MG tablet Take 25 mg by mouth 2 (two) times daily.  Marland Kitchen omeprazole (PRILOSEC) 40 MG capsule Take 40 mg by mouth daily.   . ramipril (ALTACE) 10 MG capsule   . simvastatin (ZOCOR) 20 MG tablet   . tamsulosin (FLOMAX) 0.4 MG CAPS capsule Take 0.4 mg by mouth daily.  . vitamin B-12 (CYANOCOBALAMIN) 1000 MCG tablet Take 1,000 mcg by mouth daily.  . vitamin B-6 (PYRIDOXINE) 25 MG tablet Take 25 mg by mouth daily.  [name] 2.5 MG TABS tablet TAKE 1 TABLET TWICE DAILY   No current facility-administered medications for this visit. (Other)      REVIEW OF SYSTEMS: ROS    Positive for: Gastrointestinal, Genitourinary, Endocrine, Cardiovascular, Eyes   Negative for: Constitutional, Neurological, Skin, Musculoskeletal, HENT, Respiratory, Psychiatric, Allergic/Imm, Heme/Lymph   Last edited by [name], COA on  08/08/2020  1:43 PM. (History)       ALLERGIES Allergies  Allergen Reactions  . Contrast Media [Iodinated Diagnostic Agents] Itching    PT STATES ALLERGY TO "CT DYE".  [name] [Red Dye] Swelling    CAT scan dye  . Ibuprofen Other (See Comments)    Upset stomach     PAST MEDICAL HISTORY Past Medical History:  Diagnosis Date  . Anxiety   . Arthritis   . Coronary artery disease   . Diabetes mellitus without complication ([place])   . Diabetic retinopathy ([place])    NPDR OU  . Diverticulitis   . Dyspnea   . GERD (gastroesophageal reflux disease)   . Hypercholesteremia   . Hypertension   . Hypertensive retinopathy    OU  . Pneumonia    as a child  . Prostate  cancer ([place])   . UTI (lower urinary tract infection)    Past Surgical History:  Procedure Laterality Date  . ABDOMINAL SURGERY     for diverticulitis; also removed appendix  . CATARACT EXTRACTION    . CATARACT EXTRACTION, BILATERAL    . CHOLECYSTECTOMY N/A 10/12/2017   Procedure: LAPAROSCOPIC CHOLECYSTECTOMY;  Surgeon: [name], MD;  Location: [facility];  Service: General;  Laterality: N/A;  . CORONARY ARTERY BYPASS GRAFT  2009  . ERCP N/A 12/21/2018   Procedure: ENDOSCOPIC RETROGRADE CHOLANGIOPANCREATOGRAPHY (ERCP);  Surgeon: [name], MD;  Location: [facility];  Service: Endoscopy;  Laterality: N/A;  . ESOPHAGOGASTRODUODENOSCOPY (EGD) WITH PROPOFOL N/A 12/17/2018   Procedure: ESOPHAGOGASTRODUODENOSCOPY (EGD) WITH PROPOFOL;  Surgeon: [name], MD;  Location: [facility];  Service: Endoscopy;  Laterality: N/A;  . EUS Left 12/17/2018   Procedure: UPPER ENDOSCOPIC ULTRASOUND (EUS) LINEAR;  Surgeon: [name], MD;  Location: [facility];  Service: Endoscopy;  Laterality: Left;  . EYE SURGERY     "for bleeding in eye"  . HERNIA REPAIR    . LEFT HEART CATH AND CORONARY ANGIOGRAPHY N/A 11/04/2016   Procedure: Left Heart Cath and Coronary Angiography;  Surgeon: [name], MD;  Location: [facility];  Service: Cardiovascular;  Laterality: N/A;  . LOWER EXTREMITY ANGIOGRAPHY N/A 11/04/2016   Procedure: Lower Extremity Angiography;  Surgeon: [name], MD;  Location: [facility];  Service: Cardiovascular;  Laterality: N/A;  . LOWER EXTREMITY ANGIOGRAPHY N/A 01/03/2020   Procedure: LOWER EXTREMITY ANGIOGRAPHY;  Surgeon: [name], MD;  Location: [facility];  Service: Cardiovascular;  Laterality: N/A;  . PROSTATE SURGERY    . REMOVAL OF STONES  12/21/2018   Procedure: REMOVAL OF STONES;  Surgeon: [name], MD;  Location: [facility];  Service: Endoscopy;;  . [name]  12/21/2018   Procedure: [name];  Surgeon: [name], MD;  Location: [facility]  ENDOSCOPY;  Service: Endoscopy;;    FAMILY HISTORY Family History  Problem Relation Age of Onset  . Diabetes Father   . Diabetes Maternal Aunt   . Diabetes Maternal Uncle   . Diabetes Maternal Grandmother   . Heart disease Sister   . Diabetes Brother   . Heart disease Sister     SOCIAL HISTORY Social History   Tobacco Use  . Smoking status: Former Smoker    Packs/day: 0.25    Years: 2.00    Pack years: 0.50    Types: Cigarettes  . Smokeless tobacco: Never Used  . Tobacco comment: when he was a teenage age [age]  Vaping Use  . Vaping Use: Never used  Substance Use Topics  .  Alcohol use: No  . Drug use: No         OPHTHALMIC EXAM:  Base Eye Exam    Visual Acuity (Snellen - Linear)      Right Left   Dist cc 20/40 20/40 -1   Dist ph cc NI 20/25 -1   Correction: Glasses       Tonometry (Tonopen, 1:40 PM)      Right Left   Pressure 11 12       Pupils      Dark Light Shape React APD   Right 3 2 Round Brisk None   Left 3 2 Round Brisk None       Visual Fields (Counting fingers)      Left Right    Full Full       Extraocular Movement      Right Left    Full, Ortho Full, Ortho       Neuro/Psych    Oriented x3: Yes   Mood/Affect: Normal       Dilation    Both eyes: 1.0% Mydriacyl, 2.5% Phenylephrine @ 1:40 PM        Slit Lamp and Fundus Exam    Slit Lamp Exam      Right Left   Lids/Lashes Dermatochalasis - upper lid, Meibomian gland dysfunction Dermatochalasis - upper lid, Meibomian gland dysfunction   Conjunctiva/Sclera Melanosis Melanosis   Cornea Arcus, 1+ Punctate epithelial erosions Arcus, 1+ Punctate epithelial erosions   Anterior Chamber Deep and quiet Deep and quiet   Iris Mild Temporal Iris atrophy, Round and dilated, No NVI Mild Temporal Iris atrophy, Round and dilated, No NVI   Lens PC IOL in good position with mild aterior capsule fimosis PC IOL in good position with mild aterior capsule fimosis   Vitreous Vitreous syneresis,  Posterior vitreous detachment, vitreous condensations Vitreous syneresis, Posterior vitreous detachment, vitreous condensations       Fundus Exam      Right Left   Disc pallor with sharp rim, Peripapillary atrophy, Compact mild pallor, sharp rim, Tilted disc, mild temporal Temporal Peripapillary atrophy, Compact   C/D Ratio 0.2 0.3   Macula Blunted foveal reflex, Retinal pigment epithelial mottling and clumping, Microaneurysms -- improved, persistent central cystic changes  Blunted foveal reflex, focal area of RPE atrophy SN to fovea, Microaneurysms, Retinal pigment epithelial mottling, refractile Epiretinal membrane, central cystic changes - persistent   Vessels Vascular attenuation, Tortuous Tortuous, Vascular attenuation greater superiorly   Periphery Attached, rare, scattered MA, good peripheral PRP 360 - room for posterior fill in if needed Attached, rare, scattered MA, Peripheral PRP 360--room for posterior fill in if needed        Refraction    Wearing Rx      Sphere Cylinder Axis Add   Right +0.25 +0.50 010 +3.00   Left [place] +0.75 173 +3.00   Type: Bifocal          IMAGING AND PROCEDURES  Imaging and Procedures for _0 @  OCT, Retina - OU - Both Eyes       Right Eye Quality was good. Central Foveal Thickness: 374. Progression has been stable. Findings include abnormal foveal contour, intraretinal fluid, no SRF, retinal drusen , outer retinal atrophy (Persistent central cystic changes -- minimal improvement from prior).   Left Eye Quality was good. Central Foveal Thickness: 253. Progression has been stable. Findings include abnormal foveal contour, no SRF, intraretinal fluid, outer retinal atrophy, retinal drusen  (Persistent cystic changes).   Notes *Images captured  and stored on drive  Diagnosis / Impression:  DME OU, OD>OS OD: Persistent central cystic changes, no significant improvement from prior OS: Persistent cystic changes  Clinical management:  See  below  Abbreviations: NFP - Normal foveal profile. CME - cystoid macular edema. PED - pigment epithelial detachment. IRF - intraretinal fluid. SRF - subretinal fluid. EZ - ellipsoid zone. ERM - epiretinal membrane. ORA - outer retinal atrophy. ORT - outer retinal tubulation. SRHM - subretinal hyper-reflective material         Intravitreal Injection, Pharmacologic Agent - OD - Right Eye       Time Out 08/08/2020. 2:20 PM. Confirmed correct patient, procedure, site, and patient consented.   Anesthesia Topical anesthesia was used. Anesthetic medications included Lidocaine 2%, Proparacaine 0.5%.   Procedure Preparation included 5% betadine to ocular surface, eyelid speculum. A (32g) needle was used.   Injection:  2 mg aflibercept Alfonse Flavors) SOLN   NDC: A3590391, Lot: [id number], Expiration date: 10/30/2020   Route: Intravitreal, Site: Right Eye, Waste: 0.05 mL  Post-op Post injection exam found visual acuity of at least counting fingers. The patient tolerated the procedure well. There were no complications. The patient received written and verbal post procedure care education. Post injection medications were not given.        Intravitreal Injection, Pharmacologic Agent - OS - Left Eye       Time Out 08/08/2020. 2:20 PM. Confirmed correct patient, procedure, site, and patient consented.   Anesthesia Topical anesthesia was used. Anesthetic medications included Lidocaine 2%, Proparacaine 0.5%.   Procedure Preparation included 5% betadine to ocular surface, eyelid speculum. A (32g) needle was used.   Injection:  2 mg aflibercept Alfonse Flavors) SOLN   NDC: M7179715, Lot: [id number], Expiration date: 05/30/2021   Route: Intravitreal, Site: Left Eye, Waste: 0.05 mL  Post-op Post injection exam found visual acuity of at least counting fingers. The patient tolerated the procedure well. There were no complications. The patient received written and verbal post procedure care education.  Post injection medications were not given.                 ASSESSMENT/PLAN:    ICD-10-CM   1. Moderate nonproliferative diabetic retinopathy of both eyes with macular edema associated with type 2 diabetes mellitus (HCC)  W40.9735 Intravitreal Injection, Pharmacologic Agent - OD - Right Eye    Intravitreal Injection, Pharmacologic Agent - OS - Left Eye    aflibercept (EYLEA) SOLN 2 mg    aflibercept (EYLEA) SOLN 2 mg  2. Retinal edema  H35.81 OCT, Retina - OU - Both Eyes  3. Essential hypertension  I10   4. Hypertensive retinopathy of both eyes  H35.033   5. Pseudophakia of both eyes  Z96.1     1,2. Moderate non-proliferative diabetic retinopathy with edema OU   - moved to [place] from [place], [place] -- history of prior injections OS with Dr. [name]  - records received and reviewed from [facility] [place] (Dr. [name])  - history of focal laser OS x2 and IVK/IVT OU in [place] -- last injection was IVK OS November 2013  - initial exam: scattered IRH, DBH and +macular edema  - initial OCT: diabetic macular edema, both eyes, (OD > OS)  - FA (10.1.19) showed late leaking MA OU -- no NV  - delayed f/u on 4.16.20 due to hospitalization for sepsis / gallstones  - S/P PRP OD (07.14.20) - good laser surrounding, room for posterior fill in if needed  - S/P PRP OS (  08.26.20)  - S/P IVA OD #1 (09.17.19), #2 (10.29.19), #3 (11.26.19), #4 (01.02.20), #5 (01.30.20), #6 (02.28.20), #7 (04.16.20), #8 (06.11.20), #9 (08.12.20), #10 (01.11.21)  - S/P IVA OS #1 (10.01.19), #2 (10.29.19), #3 (11.26.19), #4 (01.02.20), #5 (01.30.20), #6 (02.28.20), #7 (04.16.20), #8 (05.14.20), #9 (06.11.20), #10 (07.09.20), #11 (08.12.20), #12 (01.11.21)  - S/P IVK OD #1 (05.14.20) -- no significant improvement  - switched back to Avastin OD (#9 6.11.2020)  - repeated Eylea4U benefits investigation due to possible IVA resistance (8.12.20)  - S/P IVE OS #1 (09.16.20), #2 (10.16.20), #3 (11.13.20), #4  (12.11.20), #5 (02.08.21), #6 (03.12.21), #7 (04.16.21), #8 (05.14.21), #9 (06.18.21), #10 (07.23.21), #11 (8.30.21), #12 (10.05.21)  - S/P IVE OD #1 (09.16.20), #2 (10.16.20), #3 (11.13.20), #4 (12.11.20), #5 (02.08.21), #6 (03.12.21), #7 (04.16.21), #8 (05.14.21), #9 (06.18.21), #10 (07.23.21), #11 (8.30.21), #12 (10.05.21)  - OCT today shows persistent cystic changes OS; OD persistent central cysts  - BCVA slightly decreased to 20/40 from 20/30 OD: OS improved to 20/25 from 20/30  - recommend IVE OU #13 today, 11.10.21  - Eylea4U benefits investigation completed and verified for 2021 as of 02.08.21  - pt wishes to proceed  - RBA of procedure discussed, questions answered  - informed consent obtained  - see procedure note  - Eylea informed consent form signed and scanned on 12.11.2020  - f/u 5 weeks, DFE, OCT, possible injection  3,4. Hypertensive retinopathy OU  - discussed importance of tight BP control  - monitor  5. Pseudophakia OU  - s/p CE/IOL OU in [place], [place]  - beautiful surgeries, doing well  - monitor   Ophthalmic Meds Ordered this visit:  Meds ordered this encounter  Medications  . aflibercept (EYLEA) SOLN 2 mg  . aflibercept (EYLEA) SOLN 2 mg       Return in about 5 weeks (around 09/12/2020) for f/u NPDR OU, DFE, OCT.  There are no Patient Instructions on file for this visit.  This document serves as a record of services personally performed by Gardiner Sleeper, MD, PhD. It was created on their behalf by Roselee Nova, COMT. The creation of this record is the provider's dictation and/or activities during the visit.  Electronically signed by: Roselee Nova, COMT 08/09/20 10:37 PM   This document serves as a record of services personally performed by Gardiner Sleeper, MD, PhD. It was created on their behalf by San Jetty. Owens Shark, OA an ophthalmic technician. The creation of this record is the provider's dictation and/or activities during the visit.    Electronically  signed by: San Jetty. Owens Shark, [place] 11.10.2021 10:37 PM  Gardiner Sleeper, M.D., Ph.D. Diseases & Surgery of the Retina and Vitreous [facility]  I have reviewed the above documentation for accuracy and completeness, and I agree with the above. Gardiner Sleeper, M.D., Ph.D. 08/09/20 10:37 PM   Abbreviations: M myopia (nearsighted); A astigmatism; H hyperopia (farsighted); P presbyopia; Mrx spectacle prescription;  CTL contact lenses; OD right eye; OS left eye; OU both eyes  XT exotropia; ET esotropia; PEK punctate epithelial keratitis; PEE punctate epithelial erosions; DES dry eye syndrome; MGD meibomian gland dysfunction; ATs artificial tears; PFAT's preservative free artificial tears; [place] nuclear sclerotic cataract; PSC posterior subcapsular cataract; ERM epi-retinal membrane; PVD posterior vitreous detachment; RD retinal detachment; DM diabetes mellitus; DR diabetic retinopathy; NPDR non-proliferative diabetic retinopathy; PDR proliferative diabetic retinopathy; CSME clinically significant macular edema; DME diabetic macular edema; dbh dot blot hemorrhages; CWS cotton wool spot; POAG primary  open angle glaucoma; C/D cup-to-disc ratio; HVF humphrey visual field; GVF goldmann visual field; OCT optical coherence tomography; IOP intraocular pressure; BRVO Branch retinal vein occlusion; CRVO central retinal vein occlusion; CRAO central retinal artery occlusion; BRAO branch retinal artery occlusion; RT retinal tear; SB scleral buckle; PPV pars plana vitrectomy; VH Vitreous hemorrhage; PRP panretinal laser photocoagulation; IVK intravitreal kenalog; VMT vitreomacular traction; MH Macular hole;  NVD neovascularization of the disc; NVE neovascularization elsewhere; AREDS age related eye disease study; ARMD age related macular degeneration; POAG primary open angle glaucoma; EBMD epithelial/anterior basement membrane dystrophy; ACIOL anterior chamber intraocular lens; IOL intraocular lens; PCIOL  posterior chamber intraocular lens; Phaco/IOL phacoemulsification with intraocular lens placement; [place] photorefractive keratectomy; LASIK laser assisted in situ keratomileusis; HTN hypertension; DM diabetes mellitus; COPD chronic obstructive pulmonary disease

## 2020-08-08 ENCOUNTER — Ambulatory Visit (INDEPENDENT_AMBULATORY_CARE_PROVIDER_SITE_OTHER): Payer: Medicare HMO | Admitting: Ophthalmology

## 2020-08-08 ENCOUNTER — Other Ambulatory Visit: Payer: Self-pay

## 2020-08-08 ENCOUNTER — Encounter (INDEPENDENT_AMBULATORY_CARE_PROVIDER_SITE_OTHER): Payer: Self-pay | Admitting: Ophthalmology

## 2020-08-08 DIAGNOSIS — H35033 Hypertensive retinopathy, bilateral: Secondary | ICD-10-CM

## 2020-08-08 DIAGNOSIS — E113313 Type 2 diabetes mellitus with moderate nonproliferative diabetic retinopathy with macular edema, bilateral: Secondary | ICD-10-CM

## 2020-08-08 DIAGNOSIS — Z961 Presence of intraocular lens: Secondary | ICD-10-CM

## 2020-08-08 DIAGNOSIS — I10 Essential (primary) hypertension: Secondary | ICD-10-CM

## 2020-08-08 DIAGNOSIS — H3581 Retinal edema: Secondary | ICD-10-CM

## 2020-08-09 ENCOUNTER — Encounter (INDEPENDENT_AMBULATORY_CARE_PROVIDER_SITE_OTHER): Payer: Self-pay | Admitting: Ophthalmology

## 2020-08-09 DIAGNOSIS — H3581 Retinal edema: Secondary | ICD-10-CM | POA: Diagnosis not present

## 2020-08-09 DIAGNOSIS — H35033 Hypertensive retinopathy, bilateral: Secondary | ICD-10-CM | POA: Diagnosis not present

## 2020-08-09 DIAGNOSIS — I10 Essential (primary) hypertension: Secondary | ICD-10-CM | POA: Diagnosis not present

## 2020-08-09 DIAGNOSIS — Z961 Presence of intraocular lens: Secondary | ICD-10-CM | POA: Diagnosis not present

## 2020-08-09 DIAGNOSIS — E113313 Type 2 diabetes mellitus with moderate nonproliferative diabetic retinopathy with macular edema, bilateral: Secondary | ICD-10-CM

## 2020-08-09 MED ORDER — AFLIBERCEPT 2MG/0.05ML IZ SOLN FOR KALEIDOSCOPE
2.0000 mg | INTRAVITREAL | Status: AC | PRN
  Administered 2020-08-09: 2 mg via INTRAVITREAL

## 2020-08-13 DIAGNOSIS — M5416 Radiculopathy, lumbar region: Secondary | ICD-10-CM | POA: Diagnosis not present

## 2020-08-21 DIAGNOSIS — Z0001 Encounter for general adult medical examination with abnormal findings: Secondary | ICD-10-CM | POA: Diagnosis not present

## 2020-08-21 DIAGNOSIS — F419 Anxiety disorder, unspecified: Secondary | ICD-10-CM | POA: Diagnosis not present

## 2020-08-21 DIAGNOSIS — I10 Essential (primary) hypertension: Secondary | ICD-10-CM | POA: Diagnosis not present

## 2020-08-21 DIAGNOSIS — E1165 Type 2 diabetes mellitus with hyperglycemia: Secondary | ICD-10-CM | POA: Diagnosis not present

## 2020-08-21 DIAGNOSIS — R1013 Epigastric pain: Secondary | ICD-10-CM | POA: Diagnosis not present

## 2020-08-21 DIAGNOSIS — E782 Mixed hyperlipidemia: Secondary | ICD-10-CM | POA: Diagnosis not present

## 2020-08-21 DIAGNOSIS — G629 Polyneuropathy, unspecified: Secondary | ICD-10-CM | POA: Diagnosis not present

## 2020-08-21 DIAGNOSIS — N400 Enlarged prostate without lower urinary tract symptoms: Secondary | ICD-10-CM | POA: Diagnosis not present

## 2020-08-21 DIAGNOSIS — E1142 Type 2 diabetes mellitus with diabetic polyneuropathy: Secondary | ICD-10-CM | POA: Diagnosis not present

## 2020-08-27 DIAGNOSIS — M5416 Radiculopathy, lumbar region: Secondary | ICD-10-CM | POA: Diagnosis not present

## 2020-09-06 DIAGNOSIS — Z6830 Body mass index (BMI) 30.0-30.9, adult: Secondary | ICD-10-CM | POA: Diagnosis not present

## 2020-09-06 DIAGNOSIS — I10 Essential (primary) hypertension: Secondary | ICD-10-CM | POA: Diagnosis not present

## 2020-09-06 DIAGNOSIS — M48062 Spinal stenosis, lumbar region with neurogenic claudication: Secondary | ICD-10-CM | POA: Diagnosis not present

## 2020-09-06 NOTE — Progress Notes (Signed)
[facility] Note  09/12/2020     CHIEF COMPLAINT Patient presents for Retina Follow Up   HISTORY OF PRESENT ILLNESS: Justin [name] is a 86 y.o. male who presents to the clinic today for:   HPI    Retina Follow Up    Patient presents with  Diabetic Retinopathy.  In both eyes.  Severity is moderate.  Duration of 5 weeks.  Since onset it is stable.  I, the attending physician,  performed the HPI with the patient and updated documentation appropriately.          Comments    Patient states vision the same OU. BS was 106 this am. Last a1c was around 8.       Last edited by [name], MD on 09/12/2020  1:52 PM. (History)    pt states vision is "not that great",    Referring physician: [name], MD [address] [place],  [place] [address]  HISTORICAL INFORMATION:   Selected notes from the [facility] Referred by Dr. [name] for concern of mac edema OU    CURRENT MEDICATIONS: No current outpatient medications on file. (Ophthalmic Drugs)   No current facility-administered medications for this visit. (Ophthalmic Drugs)   Current Outpatient Medications (Other)  Medication Sig  . ACCU-CHEK SMARTVIEW test strip   . Accu-Chek Softclix Lancets lancets   . acetaminophen (TYLENOL) 500 MG tablet Take 1,000 mg by mouth every 4 (four) hours as needed for mild pain.   Marland Kitchen AMITIZA 24 MCG capsule Take 24 mcg by mouth as needed for constipation.   Marland Kitchen aspirin EC 81 MG tablet Take 81 mg by mouth daily.  . Blood Glucose Monitoring Suppl (ACCU-CHEK GUIDE) w/Device KIT   . chlorthalidone (HYGROTON) 50 MG tablet Take 25 mg by mouth daily.   . clobetasol cream (TEMOVATE) 0.05 %   . clonazePAM (KLONOPIN) 0.5 MG tablet Take 0.5 mg by mouth daily as needed for anxiety.  . DULoxetine (CYMBALTA) 30 MG capsule   . ezetimibe (ZETIA) 10 MG tablet Take 1 tablet (10 mg total) by mouth daily after supper.  . gabapentin (NEURONTIN) 300 MG  capsule   . isosorbide mononitrate (IMDUR) 120 MG 24 hr tablet Take 1 tablet (120 mg total) by mouth daily.  Marland Kitchen JANUVIA 100 MG tablet Take 100 mg by mouth daily.   Marland Kitchen LANTUS SOLOSTAR 100 UNIT/ML Solostar Pen Inject 15-50 Units into the skin See admin instructions. Inject 50 units in the morning and 15 units in the evening  . LINZESS 145 MCG CAPS capsule   . metoprolol (LOPRESSOR) 50 MG tablet Take 25 mg by mouth 2 (two) times daily.  Marland Kitchen omeprazole (PRILOSEC) 40 MG capsule Take 40 mg by mouth daily.   . ramipril (ALTACE) 10 MG capsule   . simvastatin (ZOCOR) 20 MG tablet   . tamsulosin (FLOMAX) 0.4 MG CAPS capsule Take 0.4 mg by mouth daily.  . vitamin B-12 (CYANOCOBALAMIN) 1000 MCG tablet Take 1,000 mcg by mouth daily.  . vitamin B-6 (PYRIDOXINE) 25 MG tablet Take 25 mg by mouth daily.  [name] 2.5 MG TABS tablet TAKE 1 TABLET TWICE DAILY   No current facility-administered medications for this visit. (Other)      REVIEW OF SYSTEMS: ROS    Positive for: Gastrointestinal, Genitourinary, Endocrine, Cardiovascular, Eyes   Negative for: Constitutional, Neurological, Skin, Musculoskeletal, HENT, Respiratory, Psychiatric, Allergic/Imm, Heme/Lymph   Last edited by Roselee Nova D, COT on 09/12/2020  8:38 AM. (History)       ALLERGIES Allergies  Allergen Reactions  . Contrast Media [Iodinated Diagnostic Agents] Itching    PT STATES ALLERGY TO "CT DYE".  [name] [Red Dye] Swelling    CAT scan dye  . Ibuprofen Other (See Comments)    Upset stomach     PAST MEDICAL HISTORY Past Medical History:  Diagnosis Date  . Anxiety   . Arthritis   . Coronary artery disease   . Diabetes mellitus without complication ([place])   . Diabetic retinopathy ([place])    NPDR OU  . Diverticulitis   . Dyspnea   . GERD (gastroesophageal reflux disease)   . Hypercholesteremia   . Hypertension   . Hypertensive retinopathy    OU  . Pneumonia    as a child  . Prostate cancer ([place])   . UTI (lower  urinary tract infection)    Past Surgical History:  Procedure Laterality Date  . ABDOMINAL SURGERY     for diverticulitis; also removed appendix  . CATARACT EXTRACTION    . CATARACT EXTRACTION, BILATERAL    . CHOLECYSTECTOMY N/A 10/12/2017   Procedure: LAPAROSCOPIC CHOLECYSTECTOMY;  Surgeon: [name], MD;  Location: [facility];  Service: General;  Laterality: N/A;  . CORONARY ARTERY BYPASS GRAFT  2009  . ERCP N/A 12/21/2018   Procedure: ENDOSCOPIC RETROGRADE CHOLANGIOPANCREATOGRAPHY (ERCP);  Surgeon: [name], MD;  Location: [facility];  Service: Endoscopy;  Laterality: N/A;  . ESOPHAGOGASTRODUODENOSCOPY (EGD) WITH PROPOFOL N/A 12/17/2018   Procedure: ESOPHAGOGASTRODUODENOSCOPY (EGD) WITH PROPOFOL;  Surgeon: [name], MD;  Location: [facility];  Service: Endoscopy;  Laterality: N/A;  . EUS Left 12/17/2018   Procedure: UPPER ENDOSCOPIC ULTRASOUND (EUS) LINEAR;  Surgeon: [name], MD;  Location: [facility];  Service: Endoscopy;  Laterality: Left;  . EYE SURGERY     "for bleeding in eye"  . HERNIA REPAIR    . LEFT HEART CATH AND CORONARY ANGIOGRAPHY N/A 11/04/2016   Procedure: Left Heart Cath and Coronary Angiography;  Surgeon: [name], MD;  Location: [facility];  Service: Cardiovascular;  Laterality: N/A;  . LOWER EXTREMITY ANGIOGRAPHY N/A 11/04/2016   Procedure: Lower Extremity Angiography;  Surgeon: [name], MD;  Location: [facility];  Service: Cardiovascular;  Laterality: N/A;  . LOWER EXTREMITY ANGIOGRAPHY N/A 01/03/2020   Procedure: LOWER EXTREMITY ANGIOGRAPHY;  Surgeon: [name], MD;  Location: [facility];  Service: Cardiovascular;  Laterality: N/A;  . PROSTATE SURGERY    . REMOVAL OF STONES  12/21/2018   Procedure: REMOVAL OF STONES;  Surgeon: [name], MD;  Location: [facility];  Service: Endoscopy;;  . [name]  12/21/2018   Procedure: [name];  Surgeon: [name], MD;  [facility];  Service:  Endoscopy;;    FAMILY HISTORY Family History  Problem Relation Age of Onset  . Diabetes Father   . Diabetes Maternal Aunt   . Diabetes Maternal Uncle   . Diabetes Maternal Grandmother   . Heart disease Sister   . Diabetes Brother   . Heart disease Sister     SOCIAL HISTORY Social History   Tobacco Use  . Smoking status: Former Smoker    Packs/day: 0.25    Years: 2.00    Pack years: 0.50    Types: Cigarettes  . Smokeless tobacco: Never Used  . Tobacco comment: when he was a teenage age [age]  Vaping Use  . Vaping Use: Never used  Substance Use Topics  . Alcohol  use: No  . Drug use: No         OPHTHALMIC EXAM:  Base Eye Exam    Visual Acuity (Snellen - Linear)      Right Left   Dist cc 20/30 -2 20/50 +2   Dist ph cc 20/30 20/40   Correction: Glasses       Tonometry (Tonopen, 8:52 AM)      Right Left   Pressure 12 13       Pupils      Dark Light Shape React APD   Right 3 2 Round Brisk None   Left 3 2 Round Brisk None       Visual Fields (Counting fingers)      Left Right    Full Full       Extraocular Movement      Right Left    Full, Ortho Full, Ortho       Neuro/Psych    Oriented x3: Yes   Mood/Affect: Normal       Dilation    Both eyes: 1.0% Mydriacyl, 2.5% Phenylephrine @ 8:52 AM        Slit Lamp and Fundus Exam    Slit Lamp Exam      Right Left   Lids/Lashes Dermatochalasis - upper lid, Meibomian gland dysfunction Dermatochalasis - upper lid, Meibomian gland dysfunction   Conjunctiva/Sclera Melanosis Melanosis   Cornea Arcus, trace Punctate epithelial erosions, mild endopigment Arcus, 1-2+ Punctate epithelial erosions, trace endopigment, mild corneal haze   Anterior Chamber Deep and quiet Deep and quiet   Iris Mild Temporal Iris atrophy, Round and dilated, No NVI Mild Temporal Iris atrophy, Round and dilated, No NVI   Lens PC IOL in good position with mild aterior capsule fimosis PC IOL in good position with mild aterior capsule  fimosis   Vitreous Vitreous syneresis, Posterior vitreous detachment, vitreous condensations Vitreous syneresis, Posterior vitreous detachment, vitreous condensations       Fundus Exam      Right Left   Disc pallor with sharp rim, Peripapillary atrophy, Compact mild pallor, sharp rim, Tilted disc, mild temporal Temporal Peripapillary atrophy, Compact   C/D Ratio 0.2 0.3   Macula Blunted foveal reflex, Retinal pigment epithelial mottling and clumping, Microaneurysms -- improved, persistent central cystic changes  Blunted foveal reflex, focal area of RPE atrophy SN to fovea, Microaneurysms, Retinal pigment epithelial mottling, refractile Epiretinal membrane, central cystic changes - persistent   Vessels Vascular attenuation, Tortuous, AV crossing changes, mild Copper wiring Tortuous, Vascular attenuation greater superiorly   Periphery Attached, rare, scattered MA, good peripheral PRP 360 - room for posterior fill in if needed Attached, rare, scattered MA, Peripheral PRP 360--room for posterior fill in if needed        Refraction    Wearing Rx      Sphere Cylinder Axis Add   Right +0.25 +0.50 010 +3.00   Left [place] +0.75 173 +3.00   Type: Bifocal       Manifest Refraction      Sphere Cylinder Axis Dist VA   Right -0.25 +0.50 010 20/30-2   Left [place] +0.75 175 20/40-1          IMAGING AND PROCEDURES  Imaging and Procedures for _0 @  OCT, Retina - OU - Both Eyes       Right Eye Quality was good. Central Foveal Thickness: 368. Progression has improved. Findings include abnormal foveal contour, intraretinal fluid, no SRF, retinal drusen , outer retinal atrophy (Mild interval improvement in  IRF/cystic changes).   Left Eye Quality was good. Central Foveal Thickness: 256. Progression has been stable. Findings include abnormal foveal contour, no SRF, intraretinal fluid, outer retinal atrophy, retinal drusen  (Persistent mild cystic changes).   Notes *Images captured and stored on  drive  Diagnosis / Impression:  DME OU, OD>OS OD: Mild interval improvement in IRF/cystic changes OS: Persistent cystic changes  Clinical management:  See below  Abbreviations: NFP - Normal foveal profile. CME - cystoid macular edema. PED - pigment epithelial detachment. IRF - intraretinal fluid. SRF - subretinal fluid. EZ - ellipsoid zone. ERM - epiretinal membrane. ORA - outer retinal atrophy. ORT - outer retinal tubulation. SRHM - subretinal hyper-reflective material         Intravitreal Injection, Pharmacologic Agent - OD - Right Eye       Time Out 09/12/2020. 9:53 AM. Confirmed correct patient, procedure, site, and patient consented.   Anesthesia Topical anesthesia was used. Anesthetic medications included Lidocaine 2%, Proparacaine 0.5%.   Procedure Preparation included 5% betadine to ocular surface, eyelid speculum. A (32g) needle was used.   Injection:  2 mg aflibercept Alfonse Flavors) SOLN   NDC: A3590391, Lot: [id number], Expiration date: 11/27/2020   Route: Intravitreal, Site: Right Eye, Waste: 0.05 mL  Post-op Post injection exam found visual acuity of at least counting fingers. The patient tolerated the procedure well. There were no complications. The patient received written and verbal post procedure care education. Post injection medications were not given.        Intravitreal Injection, Pharmacologic Agent - OS - Left Eye       Time Out 09/12/2020. 9:54 AM. Confirmed correct patient, procedure, site, and patient consented.   Anesthesia Topical anesthesia was used. Anesthetic medications included Lidocaine 2%, Proparacaine 0.5%.   Procedure Preparation included 5% betadine to ocular surface, eyelid speculum. A (32g) needle was used.   Injection:  2 mg aflibercept Alfonse Flavors) SOLN   NDC: M7179715, Lot: [id number], Expiration date: 06/29/2021   Route: Intravitreal, Site: Left Eye, Waste: 0.05 mL  Post-op Post injection exam found visual acuity of at  least counting fingers. The patient tolerated the procedure well. There were no complications. The patient received written and verbal post procedure care education. Post injection medications were not given.                 ASSESSMENT/PLAN:    ICD-10-CM   1. Moderate nonproliferative diabetic retinopathy of both eyes with macular edema associated with type 2 diabetes mellitus (HCC)  Y50.3546 Intravitreal Injection, Pharmacologic Agent - OD - Right Eye    Intravitreal Injection, Pharmacologic Agent - OS - Left Eye    aflibercept (EYLEA) SOLN 2 mg    aflibercept (EYLEA) SOLN 2 mg  2. Retinal edema  H35.81 OCT, Retina - OU - Both Eyes  3. Essential hypertension  I10   4. Hypertensive retinopathy of both eyes  H35.033   5. Pseudophakia of both eyes  Z96.1     1,2. Moderate non-proliferative diabetic retinopathy with edema OU   - moved to [place] from [place], [place] -- history of prior injections OS with Dr. [name]  - records received and reviewed from [facility] (Dr. [name])  - history of focal laser OS x2 and IVK/IVT OU in [place] -- last injection was IVK OS November 2013  - initial exam: scattered IRH, DBH and +macular edema  - initial OCT: diabetic macular edema, both eyes, (OD > OS)  - FA (10.1.19) showed late leaking MA OU --  no NV  - delayed f/u on 4.16.20 due to hospitalization for sepsis / gallstones  - S/P PRP OD (07.14.20) - good laser surrounding, room for posterior fill in if needed  - S/P PRP OS (08.26.20)  - S/P IVA OD #1 (09.17.19), #2 (10.29.19), #3 (11.26.19), #4 (01.02.20), #5 (01.30.20), #6 (02.28.20), #7 (04.16.20), #8 (06.11.20), #9 (08.12.20), #10 (01.11.21)  - S/P IVA OS #1 (10.01.19), #2 (10.29.19), #3 (11.26.19), #4 (01.02.20), #5 (01.30.20), #6 (02.28.20), #7 (04.16.20), #8 (05.14.20), #9 (06.11.20), #10 (07.09.20), #11 (08.12.20), #12 (01.11.21)  - S/P IVK OD #1 (05.14.20) -- no significant improvement  - switched back to Avastin OD (#9  6.11.2020)  - repeated Eylea4U benefits investigation due to possible IVA resistance (8.12.20)  - S/P IVE OS #1 (09.16.20), #2 (10.16.20), #3 (11.13.20), #4 (12.11.20), #5 (02.08.21), #6 (03.12.21), #7 (04.16.21), #8 (05.14.21), #9 (06.18.21), #10 (07.23.21), #11 (8.30.21), #12 (10.05.21), #13 (11.10.21)  - S/P IVE OD #1 (09.16.20), #2 (10.16.20), #3 (11.13.20), #4 (12.11.20), #5 (02.08.21), #6 (03.12.21), #7 (04.16.21), #8 (05.14.21), #9 (06.18.21), #10 (07.23.21), #11 (8.30.21), #12 (10.05.21), #13 (11.10.21)  - OCT today shows mild improvement in IRF/cystic changes OS; OD persistent central cysts / IRF  - BCVA slightly improved to 20/30 from 20/40 OD: OS decreased to 20/40 from 20/25  - recommend IVE OU #14 today, 12.15.21  - Eylea4U benefits investigation completed and verified for 2021 as of 02.08.21  - pt wishes to proceed  - RBA of procedure discussed, questions answered  - informed consent obtained  - see procedure note  - Eylea informed consent form signed and scanned on 12.11.2020  - f/u 5 weeks, DFE, OCT, possible injection  3,4. Hypertensive retinopathy OU  - discussed importance of tight BP control  - monitor  5. Pseudophakia OU  - s/p CE/IOL OU in [place], [place]  - beautiful surgeries, doing well  - monitor   Ophthalmic Meds Ordered this visit:  Meds ordered this encounter  Medications  . aflibercept (EYLEA) SOLN 2 mg  . aflibercept (EYLEA) SOLN 2 mg       Return in about 5 weeks (around 10/17/2020) for f/u NPDR OU, DFE, OCT.  There are no Patient Instructions on file for this visit.  This document serves as a record of services personally performed by Gardiner Sleeper, MD, PhD. It was created on their behalf by Roselee Nova, COMT. The creation of this record is the provider's dictation and/or activities during the visit.  Electronically signed by: Roselee Nova, COMT 09/12/20 1:54 PM   This document serves as a record of services personally performed by Gardiner Sleeper, MD, PhD. It was created on their behalf by San Jetty. Owens Shark, OA an ophthalmic technician. The creation of this record is the provider's dictation and/or activities during the visit.    Electronically signed by: San Jetty. Owens Shark, [place] 12.15.2021 1:54 PM   Gardiner Sleeper, M.D., Ph.D. Diseases & Surgery of the Retina and Vitreous [facility]  I have reviewed the above documentation for accuracy and completeness, and I agree with the above. Gardiner Sleeper, M.D., Ph.D. 09/12/20 1:54 PM   Abbreviations: M myopia (nearsighted); A astigmatism; H hyperopia (farsighted); P presbyopia; Mrx spectacle prescription;  CTL contact lenses; OD right eye; OS left eye; OU both eyes  XT exotropia; ET esotropia; PEK punctate epithelial keratitis; PEE punctate epithelial erosions; DES dry eye syndrome; MGD meibomian gland dysfunction; ATs artificial tears; PFAT's preservative free artificial tears; [place] nuclear sclerotic cataract; [place]  posterior subcapsular cataract; ERM epi-retinal membrane; PVD posterior vitreous detachment; RD retinal detachment; DM diabetes mellitus; DR diabetic retinopathy; NPDR non-proliferative diabetic retinopathy; PDR proliferative diabetic retinopathy; CSME clinically significant macular edema; DME diabetic macular edema; dbh dot blot hemorrhages; CWS cotton wool spot; POAG primary open angle glaucoma; C/D cup-to-disc ratio; HVF humphrey visual field; GVF goldmann visual field; OCT optical coherence tomography; IOP intraocular pressure; BRVO Branch retinal vein occlusion; CRVO central retinal vein occlusion; CRAO central retinal artery occlusion; BRAO branch retinal artery occlusion; RT retinal tear; SB scleral buckle; PPV pars plana vitrectomy; VH Vitreous hemorrhage; PRP panretinal laser photocoagulation; IVK intravitreal kenalog; VMT vitreomacular traction; MH Macular hole;  NVD neovascularization of the disc; NVE neovascularization elsewhere; AREDS age related eye  disease study; ARMD age related macular degeneration; POAG primary open angle glaucoma; EBMD epithelial/anterior basement membrane dystrophy; ACIOL anterior chamber intraocular lens; IOL intraocular lens; PCIOL posterior chamber intraocular lens; Phaco/IOL phacoemulsification with intraocular lens placement; [place] photorefractive keratectomy; LASIK laser assisted in situ keratomileusis; HTN hypertension; DM diabetes mellitus; COPD chronic obstructive pulmonary disease

## 2020-09-07 DIAGNOSIS — Z794 Long term (current) use of insulin: Secondary | ICD-10-CM | POA: Diagnosis not present

## 2020-09-07 DIAGNOSIS — E1142 Type 2 diabetes mellitus with diabetic polyneuropathy: Secondary | ICD-10-CM | POA: Diagnosis not present

## 2020-09-07 DIAGNOSIS — Z7984 Long term (current) use of oral hypoglycemic drugs: Secondary | ICD-10-CM | POA: Diagnosis not present

## 2020-09-07 DIAGNOSIS — N183 Chronic kidney disease, stage 3 unspecified: Secondary | ICD-10-CM | POA: Diagnosis not present

## 2020-09-07 DIAGNOSIS — E1122 Type 2 diabetes mellitus with diabetic chronic kidney disease: Secondary | ICD-10-CM | POA: Diagnosis not present

## 2020-09-12 ENCOUNTER — Encounter (INDEPENDENT_AMBULATORY_CARE_PROVIDER_SITE_OTHER): Payer: Self-pay | Admitting: Ophthalmology

## 2020-09-12 ENCOUNTER — Ambulatory Visit (INDEPENDENT_AMBULATORY_CARE_PROVIDER_SITE_OTHER): Payer: Medicare HMO | Admitting: Ophthalmology

## 2020-09-12 ENCOUNTER — Other Ambulatory Visit: Payer: Self-pay

## 2020-09-12 DIAGNOSIS — E113313 Type 2 diabetes mellitus with moderate nonproliferative diabetic retinopathy with macular edema, bilateral: Secondary | ICD-10-CM | POA: Diagnosis not present

## 2020-09-12 DIAGNOSIS — H3581 Retinal edema: Secondary | ICD-10-CM

## 2020-09-12 DIAGNOSIS — H35033 Hypertensive retinopathy, bilateral: Secondary | ICD-10-CM | POA: Diagnosis not present

## 2020-09-12 DIAGNOSIS — Z961 Presence of intraocular lens: Secondary | ICD-10-CM

## 2020-09-12 DIAGNOSIS — I10 Essential (primary) hypertension: Secondary | ICD-10-CM | POA: Diagnosis not present

## 2020-09-12 MED ORDER — AFLIBERCEPT 2MG/0.05ML IZ SOLN FOR KALEIDOSCOPE
2.0000 mg | INTRAVITREAL | Status: AC | PRN
  Administered 2020-09-12: 2 mg via INTRAVITREAL

## 2020-09-17 ENCOUNTER — Telehealth: Payer: Self-pay | Admitting: Cardiology

## 2020-09-17 DIAGNOSIS — Z01810 Encounter for preprocedural cardiovascular examination: Secondary | ICD-10-CM

## 2020-09-17 DIAGNOSIS — Z951 Presence of aortocoronary bypass graft: Secondary | ICD-10-CM

## 2020-09-17 DIAGNOSIS — I2510 Atherosclerotic heart disease of native coronary artery without angina pectoris: Secondary | ICD-10-CM

## 2020-09-17 NOTE — Telephone Encounter (Signed)
I received a request for preoperative cardiovascular stratification for upcoming L4-5 laminectomy.  Patient has extreme high risk features, has history of CABG in the past, has not had evaluation in >5 years.  Will schedule for Lexiscan nuclear stress test.    ICD-10-CM   1. Preoperative cardiovascular examination  Z01.810 PCV MYOCARDIAL PERFUSION WITH LEXISCAN  2. Atherosclerosis of native coronary artery of native heart without angina pectoris  I25.10 PCV MYOCARDIAL PERFUSION WITH LEXISCAN  3. S/P CABG (coronary artery bypass graft) 2009 - Cath 11/04/2016: Occluded SVG to RCA. Patent SVG to OM-RI, LIMA to LAD  Z95.1 PCV MYOCARDIAL PERFUSION WITH Laurier Nancy, MD, [facility] 09/17/2020, 12:54 PM Office: [phone] Pager: [phone]   CC: Berle Mull, MD

## 2020-09-24 ENCOUNTER — Ambulatory Visit: Payer: Medicare HMO

## 2020-09-24 ENCOUNTER — Ambulatory Visit: Payer: Medicare HMO | Admitting: Cardiology

## 2020-09-24 ENCOUNTER — Other Ambulatory Visit: Payer: Self-pay

## 2020-09-24 DIAGNOSIS — Z01810 Encounter for preprocedural cardiovascular examination: Secondary | ICD-10-CM | POA: Diagnosis not present

## 2020-09-24 DIAGNOSIS — Z951 Presence of aortocoronary bypass graft: Secondary | ICD-10-CM | POA: Diagnosis not present

## 2020-09-24 DIAGNOSIS — I2510 Atherosclerotic heart disease of native coronary artery without angina pectoris: Secondary | ICD-10-CM | POA: Diagnosis not present

## 2020-09-27 ENCOUNTER — Ambulatory Visit: Payer: Medicare HMO | Admitting: Cardiology

## 2020-10-01 ENCOUNTER — Encounter: Payer: Self-pay | Admitting: Cardiology

## 2020-10-01 ENCOUNTER — Ambulatory Visit: Payer: Medicare HMO | Admitting: Cardiology

## 2020-10-01 NOTE — Progress Notes (Signed)
Primary Physician:  [name], MD   Patient ID: Justin [name], male    DOB: Jan 06, 1933, 87 y.o.   MRN: [id number]  Chief Complaint  Patient presents with  . PAD  . Hypertension  . Follow-up    6 month   HPI:    Justin [name]  is a 87 y.o. male  male with history of CAD, S/P CABG in 2009, PAD, type II diabetes with autonomic insufficiency and autonomic orthostatic hypotension, asymptomatic carotid stenosis, and hyperlipidemia.  He has patent graft except occluded SVG to RCA which is diffusely diseased small vessel and also severe below-knee bilateral lower disease by angiography on 11/04/2016 and repeat peripheral arteriogram on 01/03/20 and has severe slow flow suggestive of microvascular disease.   Patient presents for 6 month follow up. At last visit increased isosorbide mononitrate to 120 mg daily. He also recently notified our office he is planning for L4-L5 laminectomy, therefore he underwent nuclear stress testing. Patient does continue to have stable leg pain bilaterally, which is no worse with walking. He denies ulceration. He does report one episode of near-syncope with associated prodromal symptoms and no loss of consciousness. He does continue to have chronic stable dyspnea on exertion. Patient does not monitor his blood pressure at home regularly.   Past Medical History:  Diagnosis Date  . Anxiety   . Arthritis   . Coronary artery disease   . Diabetes mellitus without complication ([place])   . Diabetic retinopathy ([place])    NPDR OU  . Diverticulitis   . Dyspnea   . GERD (gastroesophageal reflux disease)   . Hypercholesteremia   . Hypertension   . Hypertensive retinopathy    OU  . Pneumonia    as a child  . Prostate cancer ([place])   . UTI (lower urinary tract infection)     Past Surgical History:  Procedure Laterality Date  . ABDOMINAL SURGERY     for diverticulitis; also removed appendix  . CATARACT EXTRACTION    . CATARACT EXTRACTION, BILATERAL    .  CHOLECYSTECTOMY N/A 10/12/2017   Procedure: LAPAROSCOPIC CHOLECYSTECTOMY;  Surgeon: [name], MD;  Location: [place];  Service: General;  Laterality: N/A;  . CORONARY ARTERY BYPASS GRAFT  2009  . ERCP N/A 12/21/2018   Procedure: ENDOSCOPIC RETROGRADE CHOLANGIOPANCREATOGRAPHY (ERCP);  Surgeon: [name], MD;  Location: [place];  Service: Endoscopy;  Laterality: N/A;  . ESOPHAGOGASTRODUODENOSCOPY (EGD) WITH PROPOFOL N/A 12/17/2018   Procedure: ESOPHAGOGASTRODUODENOSCOPY (EGD) WITH PROPOFOL;  Surgeon: [name], MD;  Location: [place];  Service: Endoscopy;  Laterality: N/A;  . EUS Left 12/17/2018   Procedure: UPPER ENDOSCOPIC ULTRASOUND (EUS) LINEAR;  Surgeon: [name], MD;  Location: [place];  Service: Endoscopy;  Laterality: Left;  . EYE SURGERY     "for bleeding in eye"  . HERNIA REPAIR    . LEFT HEART CATH AND CORONARY ANGIOGRAPHY N/A 11/04/2016   Procedure: Left Heart Cath and Coronary Angiography;  Surgeon: [name], MD;  Location: [place] [facility];  Service: Cardiovascular;  Laterality: N/A;  . LOWER EXTREMITY ANGIOGRAPHY N/A 11/04/2016   Procedure: Lower Extremity Angiography;  Surgeon: [name], MD;  Location: [place] [facility];  Service: Cardiovascular;  Laterality: N/A;  . LOWER EXTREMITY ANGIOGRAPHY N/A 01/03/2020   Procedure: LOWER EXTREMITY ANGIOGRAPHY;  Surgeon: [name], MD;  Location: [place] [facility];  Service: Cardiovascular;  Laterality: N/A;  . PROSTATE SURGERY    . REMOVAL OF STONES  12/21/2018   Procedure:  REMOVAL OF STONES;  Surgeon: [name], MD;  Location: [facility] ENDOSCOPY;  Service: Endoscopy;;  . Justin [name]  12/21/2018   Procedure: Justin [name];  Surgeon: [name], MD;  Location: [facility] ENDOSCOPY;  Service: Endoscopy;;   Social History   Tobacco Use  . Smoking status: Former Smoker    Packs/day: 0.25    Years: 2.00    Pack years: 0.50    Types: Cigarettes  . Smokeless tobacco: Never Used  . Tobacco comment: when he was  a teenage age [age]  Substance Use Topics  . Alcohol use: No    Marital Status: Widowed  ROS   Review of Systems  Constitutional: Negative for malaise/fatigue and weight gain.  Cardiovascular: Positive for claudication, dyspnea on exertion and near-syncope. Negative for chest pain, leg swelling, orthopnea, palpitations, paroxysmal nocturnal dyspnea and syncope.  Respiratory: Negative for shortness of breath.   Hematologic/Lymphatic: Does not bruise/bleed easily.  Musculoskeletal: Positive for back pain and muscle weakness.  Gastrointestinal: Negative for melena.  Neurological: Negative for dizziness and weakness.  All other systems reviewed and are negative.  Objective  Blood pressure (!) 160/88, pulse (!) 59, resp. rate 16, height '5\' 7"'  (1.702 m), weight 191 lb (86.6 kg), SpO2 96 %. Body mass index is 29.91 kg/m.  Vitals with BMI 10/02/2020 03/23/2020 01/23/2020  Height '5\' 7"'  '5\' 7"'  '5\' 7"'   Weight 191 lbs 188 lbs 186 lbs  BMI 29.91 62.37 62.83  Systolic 151 761 607  Diastolic 88 65 70  Pulse 59 56 63    Orthostatic VS for the past 72 hrs (Last 3 readings):  Orthostatic BP Patient Position BP Location Cuff Size Orthostatic Pulse  10/02/20 1112 147/56 Standing Left Arm Large 59  10/02/20 1111 163/61 Sitting Left Arm Large 56  10/02/20 1110 169/70 Supine Left Arm Large 55    Physical Exam Vitals reviewed.  Constitutional:      Appearance: He is well-developed.  Cardiovascular:     Rate and Rhythm: Normal rate and regular rhythm.     Pulses: Intact distal pulses.          Carotid pulses are on the right side with bruit and on the left side with bruit.      Femoral pulses are 2+ on the right side and 2+ on the left side.      Popliteal pulses are 1+ on the right side and 1+ on the left side.       Dorsalis pedis pulses are 0 on the right side and 0 on the left side.       Posterior tibial pulses are 0 on the right side and 0 on the left side.     Heart sounds: Normal heart  sounds.     Comments: Split S2. NO JVD. No leg edema Pulmonary:     Effort: Pulmonary effort is normal. No accessory muscle usage or respiratory distress.     Breath sounds: Normal breath sounds.  Abdominal:     General: Bowel sounds are normal.     Palpations: Abdomen is soft.     Tenderness: There is no abdominal tenderness.  Skin:    Capillary Refill: Capillary refill takes 2 to 3 seconds.    Laboratory examination:    CMP Latest Ref Rng & Units 01/03/2020 12/19/2019 03/21/2019  Glucose 70 - 99 mg/dL 124(H) 271(H) 183(H)  BUN 8 - 23 mg/dL 36(H) 27 30(H)  Creatinine 0.61 - 1.24 mg/dL 1.50(H) 1.48(H) 1.74(H)  Sodium 135 - 145 mmol/L 143 139 141  Potassium 3.5 - 5.1 mmol/L 5.1 4.2 4.1  Chloride 98 - 111 mmol/L 110 106 107(H)  CO2 20 - 29 mmol/L - 20 21  Calcium 8.6 - 10.2 mg/dL - 9.2 9.8  Total Protein 6.0 - 8.5 g/dL - - 7.1  Total Bilirubin 0.0 - 1.2 mg/dL - - 0.3  Alkaline Phos 39 - 117 IU/L - - 83  AST 0 - 40 IU/L - - 16  ALT 0 - 44 IU/L - - 16   CBC Latest Ref Rng & Units 01/03/2020 12/19/2019 12/23/2018  WBC 3.4 - 10.8 x10E3/uL - 9.1 8.5  Hemoglobin 13.0 - 17.0 g/dL 12.9(L) 13.2 9.2(L)  Hematocrit 39.0 - 52.0 % 38.0(L) 41.8 28.1(L)  Platelets 150 - 450 x10E3/uL - 203 162   Lipid Panel     Component Value Date/Time   CHOL 162 03/02/2020 0819   TRIG 66 03/02/2020 0819   HDL 63 03/02/2020 0819   LDLCALC 86 03/02/2020 0819   HEMOGLOBIN A1C Lab Results  Component Value Date   HGBA1C 8.0 (H) 10/08/2017   MPG 182.9 10/08/2017   TSH No results for input(s): TSH in the last 8760 hours.   External labs:  Cholesterol, total 155.000 M 03/14/2019 HDL 61.000 M 03/14/2019 LDL 105 NHDL 94 Triglycerides 67.000 M 03/14/2019   A1C 7.100 % 06/08/2019; TSH 1.530 03/14/2019  Hemoglobin 12.600 G/ 07/04/2019; INR 1.300 12/14/2018 Platelets 198.000 X 07/04/2019  Creatinine, Serum 1.700 MG/ 07/04/2019 Potassium 4.100 03/21/2019 Magnesium 2.000 MG/ 09/17/2016 ALT (SGPT) 16.000  03/21/2019  BNP 46.800 PG 10/13/2016  PRN Meds:. Medications Discontinued During This Encounter  Medication Reason  . DULoxetine (CYMBALTA) 30 MG capsule Patient Preference  . ramipril (ALTACE) 10 MG capsule Patient Preference  . metoprolol (LOPRESSOR) 50 MG tablet Reorder   Current Meds  Medication Sig  . ACCU-CHEK SMARTVIEW test strip   . Accu-Chek Softclix Lancets lancets   . acetaminophen (TYLENOL) 500 MG tablet Take 1,000 mg by mouth every 4 (four) hours as needed for mild pain.   Marland Kitchen AMITIZA 24 MCG capsule Take 24 mcg by mouth as needed for constipation.   Marland Kitchen aspirin EC 81 MG tablet Take 81 mg by mouth daily.  . Blood Glucose Monitoring Suppl (ACCU-CHEK GUIDE) w/Device KIT   . chlorthalidone (HYGROTON) 50 MG tablet Take 25 mg by mouth daily.   . clobetasol cream (TEMOVATE) 0.05 %   . clonazePAM (KLONOPIN) 0.5 MG tablet Take 0.5 mg by mouth daily as needed for anxiety.  Marland Kitchen ezetimibe (ZETIA) 10 MG tablet Take 1 tablet (10 mg total) by mouth daily after supper.  . gabapentin (NEURONTIN) 300 MG capsule   . isosorbide mononitrate (IMDUR) 120 MG 24 hr tablet Take 1 tablet (120 mg total) by mouth daily.  Marland Kitchen JANUVIA 100 MG tablet Take 100 mg by mouth daily.   Marland Kitchen LANTUS SOLOSTAR 100 UNIT/ML Solostar Pen Inject 15-50 Units into the skin See admin instructions. Inject 50 units in the morning and 15 units in the evening  . LINZESS 145 MCG CAPS capsule   . olmesartan (BENICAR) 5 MG tablet Take 1 tablet (5 mg total) by mouth daily.  Marland Kitchen omeprazole (PRILOSEC) 40 MG capsule Take 40 mg by mouth daily.   . simvastatin (ZOCOR) 20 MG tablet   . tamsulosin (FLOMAX) 0.4 MG CAPS capsule Take 0.4 mg by mouth daily.  . vitamin B-12 (CYANOCOBALAMIN) 1000 MCG tablet Take 1,000 mcg by mouth daily.  . vitamin B-6 (PYRIDOXINE) 25 MG tablet Take 25 mg by mouth daily.  Marland Kitchen  XARELTO 2.5 MG TABS tablet TAKE 1 TABLET TWICE DAILY  . [DISCONTINUED] metoprolol (LOPRESSOR) 50 MG tablet Take 25 mg by mouth 2 (two) times daily.    Medication and allergies    Allergies  Allergen Reactions  . Contrast Media [Iodinated Diagnostic Agents] Itching    PT STATES ALLERGY TO "CT DYE".  [name] [Red Dye] Swelling    CAT scan dye  . Ibuprofen Other (See Comments)    Upset stomach   Needing hospital admission for 24 hours and eventually discharged home the following day. Current Outpatient Medications  Medication Instructions  . ACCU-CHEK SMARTVIEW test strip No dose, route, or frequency recorded.  . Accu-Chek Softclix Lancets lancets No dose, route, or frequency recorded.  Marland Kitchen acetaminophen (TYLENOL) 1,000 mg, Oral, Every 4 hours PRN  . Amitiza 24 mcg, Oral, As needed  . aspirin EC 81 mg, Oral, Daily  . Blood Glucose Monitoring Suppl (ACCU-CHEK GUIDE) w/Device KIT No dose, route, or frequency recorded.  . chlorthalidone (HYGROTON) 25 mg, Oral, Daily  . clobetasol cream (TEMOVATE) 0.05 % No dose, route, or frequency recorded.  . clonazePAM (KLONOPIN) 0.5 mg, Oral, Daily PRN  . ezetimibe (ZETIA) 10 mg, Oral, Daily after supper  . gabapentin (NEURONTIN) 300 MG capsule No dose, route, or frequency recorded.  . isosorbide mononitrate (IMDUR) 120 mg, Oral, Daily  . Januvia 100 mg, Oral, Daily  . Lantus SoloStar 15-50 Units, Subcutaneous, See admin instructions, Inject 50 units in the morning and 15 units in the evening  . LINZESS 145 MCG CAPS capsule No dose, route, or frequency recorded.  . metoprolol tartrate (LOPRESSOR) 12.5 mg, Oral, 2 times daily  . olmesartan (BENICAR) 5 mg, Oral, Daily  . omeprazole (PRILOSEC) 40 mg, Oral, Daily  . simvastatin (ZOCOR) 20 MG tablet No dose, route, or frequency recorded.  . tamsulosin (FLOMAX) 0.4 mg, Oral, Daily  . vitamin B-12 (CYANOCOBALAMIN) 1,000 mcg, Oral, Daily  . vitamin B-6 (PYRIDOXINE) 25 mg, Oral, Daily  . XARELTO 2.5 MG TABS tablet TAKE 1 TABLET TWICE DAILY   Radiology:   No results found.   Cardiac Studies:   Lexiscan myoview stress test 04/17/2016: 1.  Resting EKG demonstrates normal sinus rhythm, left axis deviation, right bundle branch block. Stress EKG is nondiagnostic for ischemia as a pharmacologic stress test. There are frequent PACs during the stress test. Stress symptoms included dyspnea. 2. The Perfusion images reveal the left ventricle to be mildly dilated at 132 mL both in rest and stress images. There is a moderate-sized inferior and inferoapical scar with very mild peri-infarct ischemia noted especially towards the apex. Left ventricle systolic function was moderate to severely depressed at 36%. 3. This is an intermediate risk study, clinical correlation recommended.    Coronary angiogram 11/04/2016: Native RCA small and diffusely diseased with distal 70-80% stenosis. Occluded SVG to RCA. Patent SVG to OM-RI, LIMA to LAD. Medical therapy.  Echo- 12/24/2017 1. Left ventricle cavity is normal in size. Moderate concentric hypertrophy of the left ventricle. Normal global echocardiogram wall motion. Doppler evidence of grade I (impaired) diastolic dysfunction. Calculated EF 54%. 2. Left atrial cavity is mildly dilated. 3. Trileaflet aortic valve. Mild aortic valve leaflet calcification. 4. Mild to moderate mitral regurgitation. Mild calcification function of the mitral valve annulus. Mild mitral valve leaflet patient later uptitrated to calcification. 5. Trace tricuspid regurgitation.   ABI 05/02/2019: No hemodynamically significant stenoses are identified in the bilateral lower extremity arterial system.   Non-compressible ABI bilateral, suggestive of medial  calcinosis. Moderately abnormal waveform right and mildly abnormal waveform left lower extremity at the level of the ankle. Study suggests small vessel disease.  Carotid artery duplex 10/11/2019:  Stenosis in the right internal carotid artery (50-69%). The right PSV  internal/common carotid artery ratio of 4.88 is consistent with a stenosis  of >70%. Stenosis in the right  external carotid artery (<50%).  Stenosis in the left internal carotid artery (50-69%).  Stenosis in the left external carotid artery (<50%).  Peak velocity in the right ICA is 152/24 cm/s and left 203/27 cm/s. Antegrade right vertebral artery flow. Antegrade left vertebral artery  flow.  No significant change since 05/18/2019. Follow up in six months is  appropriate if clinically indicated.  Lower Extremity Arterial Duplex 11/30/2019:  No hemodynamically significant stenosis in bilateral lower extremity above  the knee.  Moderate velocity increase at the right mid posterior tibial artery.  suggests >50%stenosis.  Diffuse small vessel disease with moderately abnormal waveform right ankle  and severely abnormal waveform left ankle.  Non compressible vessels bilateral at the ankles suggests medial  calcinosis.   No significant change from 05/02/2019.  Peripheral arteriogram 01/03/2020: Distal abdominal aortogram with bifemoral arteriogram reveals widely patent iliac vessels and common femoral vessels.  Left SFA and left popliteal artery are widely patent with diffuse arterial sclerosis.  No luminal irregularity.  Below the left knee, two-vessel runoff in the form of PT and peroneal artery.  There is diffuse disease noted in both the peroneal and PT.  AT is occluded. Similarly on the right no significant disease in the SFA and there is probably a two-vessel runoff in the form of peroneal artery and PT.  AT is occluded. Recommendation: Patient has very slow flow suggestive of microvascular disease and also small vessel disease.  Unless critical limb ischemia no reason for angioplasty, continue medical therapy.  50 mill contrast utilized.  Carotid artery duplex 04/06/2020:  Stenosis in the right internal carotid artery (50-69%). The right PSV  internal/common carotid artery ratio of 4.56 is consistent with a stenosis  of >70%. Stenosis in the right external carotid artery (>50%).  Stenosis in the  left common carotid artery (<50%). Stenosis in the left  external carotid artery (<50%).  Antegrade right vertebral artery flow. Antegrade left vertebral artery  flow.  Follow up in six months is appropriate if clinically indicated. Compared  to 10/11/2019, no change in right ICA stenosis. Left ICSA stenosis was  50-69%  PCV MYOCARDIAL PERFUSION WITH LEXISCAN 09/24/2020 Lexiscan nuclear stress test performed using 1-day protocol. SPECT images showed small sized, mild intensity, mildly reversible perfusion defect in apical to basal inferior myocardium. Stress LVEF 53%. Low risk study.  EKG   EKG 10/02/2020: Sinus bradycardia at a rate of 54 bpm with borderline first-degree AV block. Left axis deviation, left anterior fascicular block.  Right bundle branch block.  Nonspecific T wave abnormality.  Compared to EKG 11/14/2019, bradycardia new.  EKG 11/14/2019: Normal sinus rhythm at 74 bpm with 1 PAC, left axis deviation, left anterior fasicular block, RBBB. Nonspecific T wave abnormality.  Assessment:     ICD-10-CM   1. Atherosclerosis of native coronary artery of native heart without angina pectoris  I25.10 EKG [id number]    Basic metabolic panel  2. S/P CABG (coronary artery bypass graft) 2009 - Cath 11/04/2016: Occluded SVG to RCA. Patent SVG to OM-RI, LIMA to LAD  Z95.1   3. Claudication in peripheral vascular disease (HCC)  I73.9   4. Asymptomatic bilateral carotid artery  stenosis  I65.23   5. Hypercholesteremia  E78.00   6. Orthostatic hypotension  I95.1   7. Primary hypertension  H82 Basic metabolic panel    Meds ordered this encounter  Medications  . metoprolol tartrate (LOPRESSOR) 25 MG tablet    Sig: Take 0.5 tablets (12.5 mg total) by mouth 2 (two) times daily.    Dispense:  90 tablet    Refill:  3  . olmesartan (BENICAR) 5 MG tablet    Sig: Take 1 tablet (5 mg total) by mouth daily.    Dispense:  30 tablet    Refill:  1   Medications Discontinued During This Encounter   Medication Reason  . DULoxetine (CYMBALTA) 30 MG capsule Patient Preference  . ramipril (ALTACE) 10 MG capsule Patient Preference  . metoprolol (LOPRESSOR) 50 MG tablet Reorder    Recommendations:   Lot Medford  is a 87 y.o. male with history of CAD, S/P CABG in 2009, PAD, type II diabetes with autonomic insufficiency and autonomic orthostatic hypotension, asymptomatic carotid stenosis, and hyperlipidemia.  He has patent graft except occluded SVG to RCA which is diffusely diseased small vessel and also severe below-knee bilateral lower disease by angiography on 11/04/2016 and repeat peripheral arteriogram on 01/03/20 and has severe slow flow suggestive of microvascular disease.    Patient presents for 8-monthfollow-up.  Patient does continue to complain of leg pain suggestive of peripheral neuropathy likely secondary to diabetes.  Vascular exam is unchanged and patient denies worsening symptoms of claudication or rest pain and there is no ulceration noted on physical exam.  In regard to coronary artery disease patient remains without recurrence of angina pectoris.  However patient's blood pressure is markedly elevated today and he is not presently orthostatic.  Suspect elevated blood pressure may be related to underlying bradycardia, will reduce metoprolol titrate from 25 mg to 12.5 mg twice daily.  We will also add olmesartan 5 mg daily and obtain BMP in 1 week.  Patient's LDL goal is <100, lipids are well controlled.  We will continue other current cardiovascular medications.  In regard to potential upcoming L4-L5 laminectomy, discussed with patient regarding his elevated cardiovascular risk compared to patients of his same age and sex without his current medical comorbidities, patient verbalized understanding.  However stress test was low risk, he did not show evidence of ischemia prohibitive going forward with back surgery.  Follow-up in 6 weeks for hypertension.   CAlethia Berthold  PA-C 10/02/2020, 12:43 PM Office: [phone]

## 2020-10-02 ENCOUNTER — Ambulatory Visit: Payer: Medicare HMO | Admitting: Student

## 2020-10-02 ENCOUNTER — Encounter: Payer: Self-pay | Admitting: Student

## 2020-10-02 ENCOUNTER — Other Ambulatory Visit: Payer: Self-pay

## 2020-10-02 VITALS — BP 160/88 | HR 59 | Resp 16 | Ht 67.0 in | Wt 191.0 lb

## 2020-10-02 DIAGNOSIS — E7800 Pure hypercholesterolemia, unspecified: Secondary | ICD-10-CM | POA: Diagnosis not present

## 2020-10-02 DIAGNOSIS — I739 Peripheral vascular disease, unspecified: Secondary | ICD-10-CM | POA: Diagnosis not present

## 2020-10-02 DIAGNOSIS — I951 Orthostatic hypotension: Secondary | ICD-10-CM

## 2020-10-02 DIAGNOSIS — I2510 Atherosclerotic heart disease of native coronary artery without angina pectoris: Secondary | ICD-10-CM | POA: Diagnosis not present

## 2020-10-02 DIAGNOSIS — I6523 Occlusion and stenosis of bilateral carotid arteries: Secondary | ICD-10-CM

## 2020-10-02 DIAGNOSIS — I10 Essential (primary) hypertension: Secondary | ICD-10-CM | POA: Diagnosis not present

## 2020-10-02 DIAGNOSIS — Z951 Presence of aortocoronary bypass graft: Secondary | ICD-10-CM

## 2020-10-02 MED ORDER — OLMESARTAN MEDOXOMIL 5 MG PO TABS: 5.0000 mg | ORAL_TABLET | Freq: Every day | ORAL | 1 refills | Status: DC

## 2020-10-02 MED ORDER — METOPROLOL TARTRATE 25 MG PO TABS: 12.5000 mg | ORAL_TABLET | Freq: Two times a day (BID) | ORAL | 3 refills | Status: DC

## 2020-10-03 DIAGNOSIS — L602 Onychogryphosis: Secondary | ICD-10-CM | POA: Diagnosis not present

## 2020-10-03 DIAGNOSIS — E1151 Type 2 diabetes mellitus with diabetic peripheral angiopathy without gangrene: Secondary | ICD-10-CM | POA: Diagnosis not present

## 2020-10-03 DIAGNOSIS — L84 Corns and callosities: Secondary | ICD-10-CM | POA: Diagnosis not present

## 2020-10-09 DIAGNOSIS — I10 Essential (primary) hypertension: Secondary | ICD-10-CM | POA: Diagnosis not present

## 2020-10-09 DIAGNOSIS — I2510 Atherosclerotic heart disease of native coronary artery without angina pectoris: Secondary | ICD-10-CM | POA: Diagnosis not present

## 2020-10-10 ENCOUNTER — Telehealth: Payer: Self-pay | Admitting: Student

## 2020-10-10 DIAGNOSIS — I10 Essential (primary) hypertension: Secondary | ICD-10-CM

## 2020-10-10 LAB — BASIC METABOLIC PANEL
BUN/Creatinine Ratio: 14 (ref 10–24)
BUN: 24 mg/dL (ref 8–27)
CO2: 17 mmol/L — ABNORMAL LOW (ref 20–29)
Calcium: 9.3 mg/dL (ref 8.6–10.2)
Chloride: 109 mmol/L — ABNORMAL HIGH (ref 96–106)
Creatinine, Ser: 1.66 mg/dL — ABNORMAL HIGH (ref 0.76–1.27)
GFR calc Af Amer: 43 mL/min/{1.73_m2} — ABNORMAL LOW (ref >59)
GFR calc non Af Amer: 37 mL/min/{1.73_m2} — ABNORMAL LOW (ref >59)
Glucose: 203 mg/dL — ABNORMAL HIGH (ref 65–99)
Potassium: 4.6 mmol/L (ref 3.5–5.2)
Sodium: 141 mmol/L (ref 134–144)

## 2020-10-10 MED ORDER — AMLODIPINE BESYLATE 2.5 MG PO TABS: 2.5000 mg | ORAL_TABLET | Freq: Every day | ORAL | 3 refills | Status: DC

## 2020-10-10 NOTE — Progress Notes (Signed)
Called and spoke with pt regarding the amlodipine. Pt agreed to confirming the medication does not have red dye before taking. AD

## 2020-10-10 NOTE — Progress Notes (Signed)
I have called patient and informed him to stop olmesartan and continue taking metoprolol as discussed at our last visit. Please let patient know that I spoke with the pharmacist and hey confirmed they can provide amlodipine without red dye (due to his allergy). Please advise patient to start amlodipine 2.5 mg daily. Please tell patient to confirm with pharmacist when he picks up amlodipine that there is no red dye.

## 2020-10-10 NOTE — Telephone Encounter (Signed)
Called and spoke to patient regarding lab results. Renal function decreased with reduced GFR and increased creatinine with addition of olmesartan. Advised patient to stop olmesartan.   Will add amlodipine 2.5 mg daily. Patient will notify the office if he experiences side effects with amlodipine. Of note patient does have red dye listed as an allergy which prompted warning for amlodipine. I personally called and spoke to the pharmacist who confirms patient will get amlodipine formulation without red dye. Advised patient to verify with pharmacy when he picks up prescription.

## 2020-10-12 ENCOUNTER — Other Ambulatory Visit: Payer: Self-pay | Admitting: Neurosurgery

## 2020-10-16 ENCOUNTER — Encounter (INDEPENDENT_AMBULATORY_CARE_PROVIDER_SITE_OTHER): Payer: Medicare HMO | Admitting: Ophthalmology

## 2020-10-16 ENCOUNTER — Ambulatory Visit (INDEPENDENT_AMBULATORY_CARE_PROVIDER_SITE_OTHER): Payer: Medicare HMO | Admitting: Ophthalmology

## 2020-10-16 ENCOUNTER — Encounter (INDEPENDENT_AMBULATORY_CARE_PROVIDER_SITE_OTHER): Payer: Self-pay | Admitting: Ophthalmology

## 2020-10-16 ENCOUNTER — Other Ambulatory Visit: Payer: Self-pay

## 2020-10-16 DIAGNOSIS — H3581 Retinal edema: Secondary | ICD-10-CM | POA: Diagnosis not present

## 2020-10-16 DIAGNOSIS — Z961 Presence of intraocular lens: Secondary | ICD-10-CM | POA: Diagnosis not present

## 2020-10-16 DIAGNOSIS — I10 Essential (primary) hypertension: Secondary | ICD-10-CM

## 2020-10-16 DIAGNOSIS — H35033 Hypertensive retinopathy, bilateral: Secondary | ICD-10-CM | POA: Diagnosis not present

## 2020-10-16 DIAGNOSIS — E113313 Type 2 diabetes mellitus with moderate nonproliferative diabetic retinopathy with macular edema, bilateral: Secondary | ICD-10-CM | POA: Diagnosis not present

## 2020-10-16 MED ORDER — AFLIBERCEPT 2MG/0.05ML IZ SOLN FOR KALEIDOSCOPE
2.0000 mg | INTRAVITREAL | Status: AC | PRN
  Administered 2020-10-16: 2 mg via INTRAVITREAL

## 2020-10-16 NOTE — Progress Notes (Signed)
[facility] Note  10/16/2020     CHIEF COMPLAINT Patient presents for Retina Follow Up   HISTORY OF PRESENT ILLNESS: Justin [name] is a 87 y.o. male who presents to the clinic today for:   HPI    Retina Follow Up    Patient presents with  Diabetic Retinopathy.  In both eyes.  This started months ago.  Severity is moderate.  Duration of 5 weeks.  Since onset it is stable.  I, the attending physician,  performed the HPI with the patient and updated documentation appropriately.          Comments    87 y/o male pt here for 5 wk f/u for mod NPDR w/DME OU.  No change in [place] OU.  Denies pain, FOL, floaters.  AT prn OU.  BS 98 this a.m.  A1C 8.0.        Last edited by [name], MD on 10/16/2020  9:56 AM. (History)    pt states vision is the same   Referring physician: [name], MD [id number] [place],  [place] [address]  HISTORICAL INFORMATION:   Selected notes from the [facility] Referred by Dr. [name] for concern of mac edema OU    CURRENT MEDICATIONS: No current outpatient medications on file. (Ophthalmic Drugs)   No current facility-administered medications for this visit. (Ophthalmic Drugs)   Current Outpatient Medications (Other)  Medication Sig  . ACCU-CHEK SMARTVIEW test strip   . Accu-Chek Softclix Lancets lancets   . acetaminophen (TYLENOL) 500 MG tablet Take 1,000 mg by mouth every 4 (four) hours as needed for mild pain.   Marland Kitchen AMITIZA 24 MCG capsule Take 24 mcg by mouth as needed for constipation.   Marland Kitchen amLODipine (NORVASC) 2.5 MG tablet Take 1 tablet (2.5 mg total) by mouth daily.  Marland Kitchen aspirin EC 81 MG tablet Take 81 mg by mouth daily.  . Blood Glucose Monitoring Suppl (ACCU-CHEK GUIDE) w/Device KIT   . chlorthalidone (HYGROTON) 50 MG tablet Take 25 mg by mouth daily.   . clobetasol cream (TEMOVATE) 0.05 %   . clonazePAM (KLONOPIN) 0.5 MG tablet Take 0.5 mg by mouth daily as needed for anxiety.  .  gabapentin (NEURONTIN) 300 MG capsule   . isosorbide mononitrate (IMDUR) 120 MG 24 hr tablet Take 1 tablet (120 mg total) by mouth daily.  Marland Kitchen JANUVIA 100 MG tablet Take 100 mg by mouth daily.   Marland Kitchen ketoconazole (NIZORAL) 2 % cream   . LANTUS SOLOSTAR 100 UNIT/ML Solostar Pen Inject 15-50 Units into the skin See admin instructions. Inject 50 units in the morning and 15 units in the evening  . LINZESS 145 MCG CAPS capsule   . metoprolol tartrate (LOPRESSOR) 25 MG tablet Take 0.5 tablets (12.5 mg total) by mouth 2 (two) times daily.  Marland Kitchen omeprazole (PRILOSEC) 40 MG capsule Take 40 mg by mouth daily.   . simvastatin (ZOCOR) 20 MG tablet   . tamsulosin (FLOMAX) 0.4 MG CAPS capsule Take 0.4 mg by mouth daily.  . vitamin B-12 (CYANOCOBALAMIN) 1000 MCG tablet Take 1,000 mcg by mouth daily.  . vitamin B-6 (PYRIDOXINE) 25 MG tablet Take 25 mg by mouth daily.  [name] 2.5 MG TABS tablet TAKE 1 TABLET TWICE DAILY  . ezetimibe (ZETIA) 10 MG tablet Take 1 tablet (10 mg total) by mouth daily after supper.   No current facility-administered medications for this visit. (Other)      REVIEW OF  SYSTEMS: ROS    Positive for: Gastrointestinal, Genitourinary, Endocrine, Cardiovascular, Eyes   Negative for: Constitutional, Neurological, Skin, Musculoskeletal, HENT, Respiratory, Psychiatric, Allergic/Imm, Heme/Lymph   Last edited by Matthew Folks, COA on 10/16/2020  9:52 AM. (History)       ALLERGIES Allergies  Allergen Reactions  . Contrast Media [Iodinated Diagnostic Agents] Itching    PT STATES ALLERGY TO "CT DYE".  [name] [Red Dye] Swelling    CAT scan dye  . Ibuprofen Other (See Comments)    Upset stomach     PAST MEDICAL HISTORY Past Medical History:  Diagnosis Date  . Anxiety   . Arthritis   . Coronary artery disease   . Diabetes mellitus without complication ([place])   . Diabetic retinopathy ([place])    NPDR OU  . Diverticulitis   . Dyspnea   . GERD (gastroesophageal reflux disease)    . Hypercholesteremia   . Hypertension   . Hypertensive retinopathy    OU  . Pneumonia    as a child  . Prostate cancer ([place])   . UTI (lower urinary tract infection)    Past Surgical History:  Procedure Laterality Date  . ABDOMINAL SURGERY     for diverticulitis; also removed appendix  . CATARACT EXTRACTION    . CATARACT EXTRACTION, BILATERAL    . CHOLECYSTECTOMY N/A 10/12/2017   Procedure: LAPAROSCOPIC CHOLECYSTECTOMY;  Surgeon: [name], MD;  Location: [facility];  Service: General;  Laterality: N/A;  . CORONARY ARTERY BYPASS GRAFT  2009  . ERCP N/A 12/21/2018   Procedure: ENDOSCOPIC RETROGRADE CHOLANGIOPANCREATOGRAPHY (ERCP);  Surgeon: [name], MD;  Location: [facility];  Service: Endoscopy;  Laterality: N/A;  . ESOPHAGOGASTRODUODENOSCOPY (EGD) WITH PROPOFOL N/A 12/17/2018   Procedure: ESOPHAGOGASTRODUODENOSCOPY (EGD) WITH PROPOFOL;  Surgeon: [name], MD;  Location: [facility];  Service: Endoscopy;  Laterality: N/A;  . EUS Left 12/17/2018   Procedure: UPPER ENDOSCOPIC ULTRASOUND (EUS) LINEAR;  Surgeon: [name], MD;  Location: [facility];  Service: Endoscopy;  Laterality: Left;  . EYE SURGERY     "for bleeding in eye"  . HERNIA REPAIR    . LEFT HEART CATH AND CORONARY ANGIOGRAPHY N/A 11/04/2016   Procedure: Left Heart Cath and Coronary Angiography;  Surgeon: [name], MD;  Location: [facility];  Service: Cardiovascular;  Laterality: N/A;  . LOWER EXTREMITY ANGIOGRAPHY N/A 11/04/2016   Procedure: Lower Extremity Angiography;  Surgeon: [name], MD;  Location: [facility];  Service: Cardiovascular;  Laterality: N/A;  . LOWER EXTREMITY ANGIOGRAPHY N/A 01/03/2020   Procedure: LOWER EXTREMITY ANGIOGRAPHY;  Surgeon: [name], MD;  Location: [facility];  Service: Cardiovascular;  Laterality: N/A;  . PROSTATE SURGERY    . REMOVAL OF STONES  12/21/2018   Procedure: REMOVAL OF STONES;  Surgeon: [name], MD;  Location: [facility];   Service: Endoscopy;;  . [name]  12/21/2018   Procedure: [name];  Surgeon: [name], MD;  Location: [facility];  Service: Endoscopy;;    FAMILY HISTORY Family History  Problem Relation Age of Onset  . Diabetes Father   . Diabetes Maternal Aunt   . Diabetes Maternal Uncle   . Diabetes Maternal Grandmother   . Heart disease Sister   . Diabetes Brother   . Heart disease Sister     SOCIAL HISTORY Social History   Tobacco Use  . Smoking status: Former Smoker    Packs/day: 0.25    Years: 2.00    Pack years: 0.50  Types: Cigarettes  . Smokeless tobacco: Never Used  . Tobacco comment: when he was a teenage age [age]  Vaping Use  . Vaping Use: Never used  Substance Use Topics  . Alcohol use: No  . Drug use: No         OPHTHALMIC EXAM:  Base Eye Exam    Visual Acuity (Snellen - Linear)      Right Left   Dist cc 20/30 -2 20/50 -2   Dist ph cc 20/25 -2 NI   Correction: Glasses       Tonometry (Tonopen, 9:54 AM)      Right Left   Pressure 13 14       Pupils      Dark Light Shape React APD   Right 3 2 Round Brisk Trace   Left 3 2 Round Brisk None       Visual Fields (Counting fingers)      Left Right    Full Full       Extraocular Movement      Right Left    Full, Ortho Full, Ortho       Neuro/Psych    Oriented x3: Yes   Mood/Affect: Normal       Dilation    Both eyes: 1.0% Mydriacyl, 2.5% Phenylephrine @ 9:54 AM        Slit Lamp and Fundus Exam    Slit Lamp Exam      Right Left   Lids/Lashes Dermatochalasis - upper lid, Meibomian gland dysfunction Dermatochalasis - upper lid, Meibomian gland dysfunction   Conjunctiva/Sclera Melanosis Melanosis   Cornea Arcus, trace Punctate epithelial erosions, mild endopigment Arcus, 1-2+ Punctate epithelial erosions, trace endopigment, mild corneal haze   Anterior Chamber Deep and quiet Deep and quiet   Iris Mild Temporal Iris atrophy, Round and dilated, No NVI Mild Temporal Iris  atrophy, Round and dilated, No NVI   Lens PC IOL in good position with mild aterior capsule fimosis PC IOL in good position with mild aterior capsule fimosis   Vitreous Vitreous syneresis, Posterior vitreous detachment, vitreous condensations Vitreous syneresis, Posterior vitreous detachment, vitreous condensations       Fundus Exam      Right Left   Disc pallor with sharp rim, Peripapillary atrophy, Compact mild pallor, sharp rim, Tilted disc, mild temporal Peripapillary atrophy, Compact   C/D Ratio 0.2 0.3   Macula Blunted foveal reflex, Retinal pigment epithelial mottling and clumping, Microaneurysms -- improved, persistent central cystic changes  Blunted foveal reflex, focal area of RPE atrophy SN to fovea, Microaneurysms, Retinal pigment epithelial mottling, refractile Epiretinal membrane, central cystic changes - trace   Vessels Vascular attenuation, Tortuous, AV crossing changes, mild Copper wiring Tortuous, Vascular attenuation greater superiorly   Periphery Attached, rare, scattered MA, good peripheral PRP 360 - room for posterior fill in if needed Attached, rare, scattered MA, Peripheral PRP 360--room for posterior fill in if needed          IMAGING AND PROCEDURES  Imaging and Procedures for _0 @  OCT, Retina - OU - Both Eyes       Right Eye Quality was good. Central Foveal Thickness: 364. Progression has been stable. Findings include abnormal foveal contour, intraretinal fluid, no SRF, retinal drusen , outer retinal atrophy (persistent IRF).   Left Eye Quality was good. Central Foveal Thickness: 246. Progression has been stable. Findings include abnormal foveal contour, no SRF, intraretinal fluid, outer retinal atrophy, retinal drusen  (Persistent mild cystic changes).   Notes *Images  captured and stored on drive  Diagnosis / Impression:  DME OU, OD>OS OD: persistent IRF OS: tr persistent cystic changes  Clinical management:  See below  Abbreviations: NFP -  Normal foveal profile. CME - cystoid macular edema. PED - pigment epithelial detachment. IRF - intraretinal fluid. SRF - subretinal fluid. EZ - ellipsoid zone. ERM - epiretinal membrane. ORA - outer retinal atrophy. ORT - outer retinal tubulation. SRHM - subretinal hyper-reflective material         Intravitreal Injection, Pharmacologic Agent - OD - Right Eye       Time Out 10/16/2020. 9:55 AM. Confirmed correct patient, procedure, site, and patient consented.   Anesthesia Topical anesthesia was used. Anesthetic medications included Lidocaine 2%, Proparacaine 0.5%.   Procedure Preparation included 5% betadine to ocular surface, eyelid speculum. A (32g) needle was used.   Injection:  2 mg aflibercept Alfonse Flavors) SOLN   NDC: A3590391, Lot: [id number], Expiration date: 01/26/2021   Route: Intravitreal, Site: Right Eye, Waste: 0.05 mL  Post-op Post injection exam found visual acuity of at least counting fingers. The patient tolerated the procedure well. There were no complications. The patient received written and verbal post procedure care education. Post injection medications were not given.        Intravitreal Injection, Pharmacologic Agent - OS - Left Eye       Time Out 10/16/2020. 9:56 AM. Confirmed correct patient, procedure, site, and patient consented.   Anesthesia Topical anesthesia was used. Anesthetic medications included Lidocaine 2%, Proparacaine 0.5%.   Procedure Preparation included 5% betadine to ocular surface, eyelid speculum. A (32g) needle was used.   Injection:  2 mg aflibercept Alfonse Flavors) SOLN   NDC: A3590391, Lot: [id number], Expiration date: 01/26/2021   Route: Intravitreal, Site: Left Eye, Waste: 0.05 mL  Post-op Post injection exam found visual acuity of at least counting fingers. The patient tolerated the procedure well. There were no complications. The patient received written and verbal post procedure care education. Post injection medications were  not given.                 ASSESSMENT/PLAN:    ICD-10-CM   1. Moderate nonproliferative diabetic retinopathy of both eyes with macular edema associated with type 2 diabetes mellitus (HCC)  H73.4287 Intravitreal Injection, Pharmacologic Agent - OD - Right Eye    Intravitreal Injection, Pharmacologic Agent - OS - Left Eye    aflibercept (EYLEA) SOLN 2 mg    aflibercept (EYLEA) SOLN 2 mg  2. Retinal edema  H35.81 OCT, Retina - OU - Both Eyes  3. Essential hypertension  I10   4. Hypertensive retinopathy of both eyes  H35.033   5. Pseudophakia of both eyes  Z96.1     1,2. Moderate non-proliferative diabetic retinopathy with edema OU   - moved to [place] from [place], [place] -- history of prior injections OS with Dr. [name]  - records received and reviewed from [facility] (Dr. [name])  - history of focal laser OS x2 and IVK/IVT OU in [place] -- last injection was IVK OS November 2013  - initial exam: scattered IRH, DBH and +macular edema  - initial OCT: diabetic macular edema, both eyes, (OD > OS)  - FA (10.1.19) showed late leaking MA OU -- no NV  - delayed f/u on 4.16.20 due to hospitalization for sepsis / gallstones  - S/P PRP OD (07.14.20) - good laser surrounding, room for posterior fill in if needed  - S/P PRP OS (08.26.20)  - S/P IVA  OD #1 (09.17.19), #2 (10.29.19), #3 (11.26.19), #4 (01.02.20), #5 (01.30.20), #6 (02.28.20), #7 (04.16.20), #8 (06.11.20), #9 (08.12.20), #10 (01.11.21)  - S/P IVA OS #1 (10.01.19), #2 (10.29.19), #3 (11.26.19), #4 (01.02.20), #5 (01.30.20), #6 (02.28.20), #7 (04.16.20), #8 (05.14.20), #9 (06.11.20), #10 (07.09.20), #11 (08.12.20), #12 (01.11.21)  - S/P IVK OD #1 (05.14.20) -- no significant improvement  - switched back to Avastin OD (#9 6.11.2020)  - repeated Eylea4U benefits investigation due to possible IVA resistance (8.12.20)  - S/P IVE OS #1 (09.16.20), #2 (10.16.20), #3 (11.13.20), #4 (12.11.20), #5 (02.08.21), #6 (03.12.21),  #7 (04.16.21), #8 (05.14.21), #9 (06.18.21), #10 (07.23.21), #11 (8.30.21), #12 (10.05.21), #13 (11.10.21), #14 (12.15.21)  - S/P IVE OD #1 (09.16.20), #2 (10.16.20), #3 (11.13.20), #4 (12.11.20), #5 (02.08.21), #6 (03.12.21), #7 (04.16.21), #8 (05.14.21), #9 (06.18.21), #10 (07.23.21), #11 (8.30.21), #12 (10.05.21), #13 (11.10.21), #14 (12.15.21)  - OCT today shows OD- persistent central cysts / IRF; OS- persistent tr IRF/cystic changes OS  - BCVA slightly improved to 20/25 from 20/30 OD: OS decreased to 20/50 from 20/40  - recommend IVE OU #15 today, 1.18.22  - Eylea4U benefits investigation completed and verified for 2022 as of 01.18.22  - pt wishes to proceed  - RBA of procedure discussed, questions answered   - informed consent obtained  - see procedure note  - Eylea informed consent form signed and scanned on 12.11.2020  - f/u 5 weeks, DFE, OCT, possible injection  3,4. Hypertensive retinopathy OU  - discussed importance of tight BP control  - monitor  5. Pseudophakia OU  - s/p CE/IOL OU in [place], [place]  - beautiful surgeries, doing well  - monitor  Ophthalmic Meds Ordered this visit:  Meds ordered this encounter  Medications  . aflibercept (EYLEA) SOLN 2 mg  . aflibercept (EYLEA) SOLN 2 mg      Return in about 5 weeks (around 11/20/2020) for DME OU - , Dilated Exam, OCT, Possible Injxn.  There are no Patient Instructions on file for this visit.  This document serves as a record of services personally performed by Gardiner Sleeper, MD, PhD. It was created on their behalf by Estill Bakes, COT an ophthalmic technician. The creation of this record is the provider's dictation and/or activities during the visit.    Electronically signed by: Estill Bakes, COT 1.18.22 @ 11:00 AM   This document serves as a record of services personally performed by Gardiner Sleeper, MD, PhD. It was created on their behalf by San Jetty. Owens Shark, OA an ophthalmic technician. The creation of this record  is the provider's dictation and/or activities during the visit.    Electronically signed by: San Jetty. Owens Shark, [place] 01.18.2022 11:00 AM  Gardiner Sleeper, M.D., Ph.D. Diseases & Surgery of the Retina and [place] 10/16/2020   I have reviewed the above documentation for accuracy and completeness, and I agree with the above. Gardiner Sleeper, M.D., Ph.D. 10/16/20 11:00 AM  Abbreviations: M myopia (nearsighted); A astigmatism; H hyperopia (farsighted); P presbyopia; Mrx spectacle prescription;  CTL contact lenses; OD right eye; OS left eye; OU both eyes  XT exotropia; ET esotropia; PEK punctate epithelial keratitis; PEE punctate epithelial erosions; DES dry eye syndrome; MGD meibomian gland dysfunction; ATs artificial tears; PFAT's preservative free artificial tears; [place] nuclear sclerotic cataract; PSC posterior subcapsular cataract; ERM epi-retinal membrane; PVD posterior vitreous detachment; RD retinal detachment; DM diabetes mellitus; DR diabetic retinopathy; NPDR non-proliferative diabetic retinopathy; PDR proliferative diabetic retinopathy; CSME clinically  significant macular edema; DME diabetic macular edema; dbh dot blot hemorrhages; CWS cotton wool spot; POAG primary open angle glaucoma; C/D cup-to-disc ratio; HVF humphrey visual field; GVF goldmann visual field; OCT optical coherence tomography; IOP intraocular pressure; BRVO Branch retinal vein occlusion; CRVO central retinal vein occlusion; CRAO central retinal artery occlusion; BRAO branch retinal artery occlusion; RT retinal tear; SB scleral buckle; PPV pars plana vitrectomy; VH Vitreous hemorrhage; PRP panretinal laser photocoagulation; IVK intravitreal kenalog; VMT vitreomacular traction; MH Macular hole;  NVD neovascularization of the disc; NVE neovascularization elsewhere; AREDS age related eye disease study; ARMD age related macular degeneration; POAG primary open angle glaucoma; EBMD epithelial/anterior  basement membrane dystrophy; ACIOL anterior chamber intraocular lens; IOL intraocular lens; PCIOL posterior chamber intraocular lens; Phaco/IOL phacoemulsification with intraocular lens placement; [place] photorefractive keratectomy; LASIK laser assisted in situ keratomileusis; HTN hypertension; DM diabetes mellitus; COPD chronic obstructive pulmonary disease

## 2020-10-19 ENCOUNTER — Other Ambulatory Visit (HOSPITAL_COMMUNITY): Payer: Medicare HMO

## 2020-10-23 DIAGNOSIS — N183 Chronic kidney disease, stage 3 unspecified: Secondary | ICD-10-CM | POA: Diagnosis not present

## 2020-10-30 DIAGNOSIS — I129 Hypertensive chronic kidney disease with stage 1 through stage 4 chronic kidney disease, or unspecified chronic kidney disease: Secondary | ICD-10-CM | POA: Diagnosis not present

## 2020-10-30 DIAGNOSIS — D631 Anemia in chronic kidney disease: Secondary | ICD-10-CM | POA: Diagnosis not present

## 2020-10-30 DIAGNOSIS — N2581 Secondary hyperparathyroidism of renal origin: Secondary | ICD-10-CM | POA: Diagnosis not present

## 2020-10-30 DIAGNOSIS — N183 Chronic kidney disease, stage 3 unspecified: Secondary | ICD-10-CM | POA: Diagnosis not present

## 2020-11-05 NOTE — Progress Notes (Signed)
Surgical Instructions    Your procedure is scheduled on February 11  Report to [facility] Main Entrance "A" at 0600 A.M., then check in with the Admitting office.  Call this number if you have problems the morning of surgery:  [phone]   If you have any questions prior to your surgery date call [phone]: Open Monday-Friday 8am-4pm    Remember:  Do not eat or drink after midnight the night before your surgery    Take these medicines the morning of surgery with A SIP OF WATER  acetaminophen (TYLENOL amLODipine (NORVASC) chlorthalidone (HYGROTON) clonazePAM (KLONOPIN) gabapentin (NEURONTIN) isosorbide mononitrate (IMDUR) LINZESS if needed  metoprolol tartrate (LOPRESSOR) omeprazole (PRILOSEC)  Eye drops  Follow your surgeon's instructions on when to stop Aspirin and XARELTO.  If no instructions were given by your surgeon then you will need to call the office to get those instructions.    As of today, STOP taking any  Aleve, Naproxen, Ibuprofen, Motrin, Advil, Goody's, BC's, all herbal medications, fish oil, and all vitamins.   WHAT DO I DO ABOUT MY DIABETES MEDICATION?   Marland Kitchen Do not take oral diabetes medicines (pills) the morning of surgery. JANUVIA   . THE NIGHT BEFORE SURGERY, take ____7_______ units of __LANTUS_________insulin.       . THE MORNING OF SURGERY, take _____25________ units of _LANTUS_________insulin.   HOW TO MANAGE YOUR DIABETES BEFORE AND AFTER SURGERY  Why is it important to control my blood sugar before and after surgery? . Improving blood sugar levels before and after surgery helps healing and can limit problems. . A way of improving blood sugar control is eating a healthy diet by: o  Eating less sugar and carbohydrates o  Increasing activity/exercise o  Talking with your doctor about reaching your blood sugar goals . High blood sugars (greater than 180 mg/dL) can raise your risk of infections and slow your recovery, so you will need to focus  on controlling your diabetes during the weeks before surgery. . Make sure that the doctor who takes care of your diabetes knows about your planned surgery including the date and location.  How do I manage my blood sugar before surgery? . Check your blood sugar at least 4 times a day, starting 2 days before surgery, to make sure that the level is not too high or low. . Check your blood sugar the morning of your surgery when you wake up and every 2 hours until you get to the Short Stay unit. o If your blood sugar is less than 70 mg/dL, you will need to treat for low blood sugar: - Do not take insulin. - Treat a low blood sugar (less than 70 mg/dL) with  cup of clear juice (cranberry or apple), 4 glucose tablets, OR glucose gel. - Recheck blood sugar in 15 minutes after treatment (to make sure it is greater than 70 mg/dL). If your blood sugar is not greater than 70 mg/dL on recheck, call [phone] for further instructions. . Report your blood sugar to the short stay nurse when you get to Short Stay.  . If you are admitted to the hospital after surgery: o Your blood sugar will be checked by the staff and you will probably be given insulin after surgery (instead of oral diabetes medicines) to make sure you have good blood sugar levels. o The goal for blood sugar control after surgery is 80-180 mg/dL.  Do not wear jewelry            Do not wear lotions, powders, colognes, or deodorant.            Men may shave face and neck.            Do not bring valuables to the hospital.            [facility] is not responsible for any belongings or valuables.  Do NOT Smoke (Tobacco/Vaping) or drink Alcohol 24 hours prior to your procedure If you use a CPAP at night, you may bring all equipment for your overnight stay.   Contacts, glasses, dentures or bridgework may not be worn into surgery, please bring cases for these belongings   For patients admitted to the hospital, discharge  time will be determined by your treatment team.   Patients discharged the day of surgery will not be allowed to drive home, and someone needs to stay with them for 24 hours.    Special instructions:   [place]- Preparing For Surgery  Before surgery, you can play an important role. Because skin is not sterile, your skin needs to be as free of germs as possible. You can reduce the number of germs on your skin by washing with CHG (chlorahexidine gluconate) Soap before surgery.  CHG is an antiseptic cleaner which kills germs and bonds with the skin to continue killing germs even after washing.    Oral Hygiene is also important to reduce your risk of infection.  Remember - BRUSH YOUR TEETH THE MORNING OF SURGERY WITH YOUR REGULAR TOOTHPASTE  Please do not use if you have an allergy to CHG or antibacterial soaps. If your skin becomes reddened/irritated stop using the CHG.  Do not shave (including legs and underarms) for at least 48 hours prior to first CHG shower. It is OK to shave your face.  Please follow these instructions carefully.   1. If you chose to wash your hair, wash your hair first as usual with your normal shampoo.  2. After you shampoo, rinse your hair and body thoroughly to remove the shampoo.  3. Wash Face and genitals (private parts) with your normal soap.   4. THEN Shower the NIGHT BEFORE SURGERY and the MORNING OF SURGERY with CHG Soap.   5. Use CHG as you would any other liquid soap. You can apply CHG directly to the skin and wash gently with a scrungie or a clean washcloth.   6. Apply the CHG Soap to your body ONLY FROM THE NECK DOWN.  Do not use on open wounds or open sores. Avoid contact with your eyes, ears, mouth and genitals (private parts). Wash Face and genitals (private parts)  with your normal soap.   7. Wash thoroughly, paying special attention to the area where your surgery will be performed.  8. Thoroughly rinse your body with warm water from the neck  down.  9. DO NOT shower/wash with your normal soap after using and rinsing off the CHG Soap.  10. Pat yourself dry with a CLEAN TOWEL.  11. Wear CLEAN PAJAMAS to bed the night before surgery  12. Place CLEAN SHEETS on your bed the night before your surgery  13. DO NOT SLEEP WITH PETS.   Day of Surgery: Wear Clean/Comfortable clothing the morning of surgery Do not apply any deodorants/lotions.   Remember to brush your teeth WITH YOUR REGULAR TOOTHPASTE.   Please read over the following fact sheets that you were  given.

## 2020-11-06 ENCOUNTER — Encounter (HOSPITAL_COMMUNITY)
Admission: RE | Admit: 2020-11-06 | Discharge: 2020-11-06 | Disposition: A | Discharge status: Home / Self Care | Payer: Medicare HMO | Source: Ambulatory Visit | Attending: Neurosurgery | Admitting: Neurosurgery

## 2020-11-06 ENCOUNTER — Other Ambulatory Visit: Payer: Self-pay

## 2020-11-06 ENCOUNTER — Other Ambulatory Visit (HOSPITAL_COMMUNITY)
Admission: RE | Admit: 2020-11-06 | Discharge: 2020-11-06 | Disposition: A | Discharge status: Home / Self Care | Payer: Medicare HMO | Source: Ambulatory Visit | Attending: Neurosurgery | Admitting: Neurosurgery

## 2020-11-06 ENCOUNTER — Encounter (HOSPITAL_COMMUNITY): Payer: Self-pay

## 2020-11-06 DIAGNOSIS — Z01812 Encounter for preprocedural laboratory examination: Secondary | ICD-10-CM | POA: Insufficient documentation

## 2020-11-06 DIAGNOSIS — Z20822 Contact with and (suspected) exposure to covid-19: Secondary | ICD-10-CM | POA: Insufficient documentation

## 2020-11-06 HISTORY — DX: Disorder of arteries and arterioles, unspecified: I77.9

## 2020-11-06 HISTORY — DX: Chronic kidney disease, unspecified: N18.9

## 2020-11-06 LAB — CBC WITH DIFFERENTIAL/PLATELET
Abs Immature Granulocytes: 0.04 10*3/uL (ref 0.00–0.07)
Basophils Absolute: 0 10*3/uL (ref 0.0–0.1)
Basophils Relative: 1 %
Eosinophils Absolute: 0.2 10*3/uL (ref 0.0–0.5)
Eosinophils Relative: 2 %
HCT: 43.3 % (ref 39.0–52.0)
Hemoglobin: 13.4 g/dL (ref 13.0–17.0)
Immature Granulocytes: 1 %
Lymphocytes Relative: 20 %
Lymphs Abs: 1.5 10*3/uL (ref 0.7–4.0)
MCH: 27.5 pg (ref 26.0–34.0)
MCHC: 30.9 g/dL (ref 30.0–36.0)
MCV: 88.7 fL (ref 80.0–100.0)
Monocytes Absolute: 0.7 10*3/uL (ref 0.1–1.0)
Monocytes Relative: 9 %
Neutro Abs: 5.2 10*3/uL (ref 1.7–7.7)
Neutrophils Relative %: 67 %
Platelets: 184 10*3/uL (ref 150–400)
RBC: 4.88 MIL/uL (ref 4.22–5.81)
RDW: 16 % — ABNORMAL HIGH (ref 11.5–15.5)
WBC: 7.6 10*3/uL (ref 4.0–10.5)
nRBC: 0 % (ref 0.0–0.2)

## 2020-11-06 LAB — BASIC METABOLIC PANEL
Anion gap: 8 (ref 5–15)
BUN: 30 mg/dL — ABNORMAL HIGH (ref 8–23)
CO2: 19 mmol/L — ABNORMAL LOW (ref 22–32)
Calcium: 9.2 mg/dL (ref 8.9–10.3)
Chloride: 112 mmol/L — ABNORMAL HIGH (ref 98–111)
Creatinine, Ser: 1.64 mg/dL — ABNORMAL HIGH (ref 0.61–1.24)
GFR, Estimated: 41 mL/min — ABNORMAL LOW (ref >60)
Glucose, Bld: 134 mg/dL — ABNORMAL HIGH (ref 70–99)
Potassium: 4.6 mmol/L (ref 3.5–5.1)
Sodium: 139 mmol/L (ref 135–145)

## 2020-11-06 LAB — HEMOGLOBIN A1C
Hgb A1c MFr Bld: 7.2 % — ABNORMAL HIGH (ref 4.8–5.6)
Mean Plasma Glucose: 159.94 mg/dL

## 2020-11-06 LAB — SARS CORONAVIRUS 2 (TAT 6-24 HRS): SARS Coronavirus 2: NEGATIVE

## 2020-11-06 LAB — GLUCOSE, CAPILLARY: Glucose-Capillary: 109 mg/dL — ABNORMAL HIGH (ref 70–99)

## 2020-11-06 LAB — SURGICAL PCR SCREEN
MRSA, PCR: NEGATIVE
Staphylococcus aureus: NEGATIVE

## 2020-11-06 NOTE — Progress Notes (Signed)
PCP - Dr. Vista Lawman Cardiologist - Dr. Einar Gip  Chest x-ray - Not indicated EKG - 10/02/20 Stress Test - 09/28/20 ECHO - Denies Cardiac Cath - 2018  Sleep Study - No OSA  DM - Type 2 CBG @ PAT appt 109 Blood Sugar - 98-130 Checks Blood Sugar _2____ times a day  Blood Thinner Instructions: Xarelto Stop 7 days prior per surgeon's instructions Aspirin Instructions: Aspirin Stop 7 days prior per surgeon's instructions  COVID TEST- 11/06/20  Anesthesia review: Yes cardiac history  Patient denies shortness of breath, fever, cough and chest pain at PAT appointment   All instructions explained to the patient, with a verbal understanding of the material. Patient agrees to go over the instructions while at home for a better understanding. Patient also instructed to self quarantine after being tested for COVID-19. The opportunity to ask questions was provided.

## 2020-11-07 ENCOUNTER — Ambulatory Visit: Payer: Medicare HMO

## 2020-11-07 DIAGNOSIS — I6523 Occlusion and stenosis of bilateral carotid arteries: Secondary | ICD-10-CM | POA: Diagnosis not present

## 2020-11-07 NOTE — Progress Notes (Signed)
Anesthesia Chart Review:  Case: [id number] Date/Time: 11/09/20 0745   Procedure: Laminectomy and Foraminotomy - bilateral - L4-L5 (Bilateral Back) - 3C   Anesthesia type: General   Pre-op diagnosis: Stenosis   Location: [facility] OR ROOM 73 / [place] OR   Surgeons: Earnie Larsson, MD      DISCUSSION: Patient is an 87 year old [name] scheduled for the above procedure.  History includes former smoker, HTN, CAD (MI, s/p CABG 2009: LIMA-LAD, SVG-PDA, SVG-[place]amus-OM2; known occluded SVG-PDA), DM2, CKD, hypercholesterolemia, carotid artery disease, dyspnea on exertion (chronic), diverticulitis, anxiety, GERD, prostate cancer (s/p surgery), cholecystectomy (10/12/17).   Last cardiology visit 10/02/20 with Justin Cruel, PA-C. In regards to surgery, she wrote, "In regard to potential upcoming L4-L5 laminectomy, discussed with patient regarding his elevated cardiovascular risk compared to patients of his same age and sex without his current medical comorbidities, patient verbalized understanding.  However stress test was low risk, he did not show evidence of ischemia prohibitive going forward with back surgery." Patient reported instructions to hold ASA and Xarelto for 7 days prior to surgery. He had routine surveillance carotid US on 11/07/20, results still pending but with known 50-Justin% BICA stenosis from 03/2020.   11/06/20 presurgical COVID-19 test negative. Anesthesia team to evaluate on the day of surgery.    VS: BP (!) 161/70   Pulse 60   Temp 36.6 C (Oral)   Resp 18   Ht '5\' 7"'  (1.702 m)   Wt 87.9 kg   SpO2 100%   BMI 30.34 kg/m    PROVIDERS: [name], MD is PCP  [name], MD is cardiologist   LABS: Labs reviewed: Acceptable for surgery. Cr 1.64, consistent with previous result (Cr 1.48-1.74 since 02/2019 in [facility]). A1c 7.2%.  (all labs ordered are listed, but only abnormal results are displayed)  Labs Reviewed  GLUCOSE, CAPILLARY - Abnormal; Notable for the following components:      Result  Value   Glucose-Capillary 109 (*)    All other components within normal limits  CBC WITH DIFFERENTIAL/PLATELET - Abnormal; Notable for the following components:   RDW 16.0 (*)    All other components within normal limits  BASIC METABOLIC PANEL - Abnormal; Notable for the following components:   Chloride 112 (*)    CO2 19 (*)    Glucose, Bld 134 (*)    BUN 30 (*)    Creatinine, Ser 1.64 (*)    GFR, Estimated 41 (*)    All other components within normal limits  HEMOGLOBIN A1C - Abnormal; Notable for the following components:   Hgb A1c MFr Bld 7.2 (*)    All other components within normal limits  SURGICAL PCR SCREEN     IMAGES: MRI L-spine 03/16/20: IMPRESSION: 1. Multilevel degenerative changes of the lumbar spine, slightly progressed at the L5-S1 level where there is mild left greater than right subarticular recess stenosis. 2. Severe canal and right lateral recess stenosis at L4-L5, unchanged from prior.   EKG: 10/02/2020: Sinus bradycardia at a rate of 54 bpm with borderline first-degree AV block. Left axis deviation, left anterior fascicular block.  Right bundle branch block.  Nonspecific T wave abnormality.    CV: Carotid US 11/07/2020: In process. (Comparison 04/06/20: 50-Justin% RICA stenosis, right RSV ICA/CCA ratio of 4.56 is consistent with > 70% stenosis. > 50% RECA stenosis. 29-93% LICA stenosis. < 50% left CCA and ECA stenosis. Antegrade vertebral artery flow bilaterally.)     Lexiscan Tetrofosmin stress test 09/24/2020: Lexiscan nuclear stress test performed  using 1-day protocol. SPECT images showed small sized, mild intensity, mildly reversible perfusion defect in apical to basal inferior myocardium. Stress LVEF 53%. Low risk study.    Peripheral arteriogram 01/03/2020: Distal abdominal aortogram with bifemoral arteriogram reveals widely patent iliac vessels and common femoral vessels. Left SFA and left popliteal artery are widely patent with diffuse arterial  sclerosis. No luminal irregularity. Below the left knee, two-vessel runoff in the form of PT and peroneal artery. There is diffuse disease noted in both the peroneal and PT. AT is occluded. Similarly on the right no significant disease in the SFA and there is probably a two-vessel runoff in the form of peroneal artery and PT. AT is occluded. Recommendation: Patient has very slow flow suggestive of microvascular disease and also small vessel disease. Unless critical limb ischemia no reason for angioplasty, continue medical therapy.   Coronary angiogram 11/04/2016: Native RCA small and diffusely diseased with distal 70-80% stenosis. Occluded SVG to RCA. Patent SVG to OM-RI, LIMA to LAD. Medical therapy.   Echo 12/24/2017: 1. Left ventricle cavity is normal in size. Moderate concentric hypertrophy of the left ventricle. Normal global echocardiogram wall motion. Doppler evidence of grade I (impaired) diastolic dysfunction. Calculated EF 54%. 2. Left atrial cavity is mildly dilated. 3. Trileaflet aortic valve. Mild aortic valve leaflet calcification. 4. Mild to moderate mitral regurgitation. Mild calcification function of the mitral valve annulus. Mild mitral valve leaflet patient later uptitrated to calcification. 5. Trace tricuspid regurgitation.    Past Medical History:  Diagnosis Date  . Anxiety   . Arthritis   . Carotid arterial disease ([place])   . CKD (chronic kidney disease)   . Coronary artery disease   . Diabetes mellitus without complication ([place])   . Diabetic retinopathy ([place])    NPDR OU  . Diverticulitis   . Dyspnea   . GERD (gastroesophageal reflux disease)   . History of kidney stones 2021  . Hypercholesteremia   . Hypertension   . Hypertensive retinopathy    OU  . Myocardial infarction ([place]) 2009  . Pneumonia    as a child  . Prostate cancer (Justin [name])   . UTI (lower urinary tract infection)     Past Surgical History:  Procedure Laterality Date  . ABDOMINAL  SURGERY     for diverticulitis; also removed appendix  . APPENDECTOMY    . CARDIAC CATHETERIZATION  2018  . CATARACT EXTRACTION    . CATARACT EXTRACTION, BILATERAL    . CHOLECYSTECTOMY N/A 10/12/2017   Procedure: LAPAROSCOPIC CHOLECYSTECTOMY;  Surgeon: [name], MD;  Location: [place];  Service: General;  Laterality: N/A;  . CORONARY ARTERY BYPASS GRAFT  2009  . ERCP N/A 12/21/2018   Procedure: ENDOSCOPIC RETROGRADE CHOLANGIOPANCREATOGRAPHY (ERCP);  Surgeon: [name], MD;  Location: [place];  Service: Endoscopy;  Laterality: N/A;  . ESOPHAGOGASTRODUODENOSCOPY (EGD) WITH PROPOFOL N/A 12/17/2018   Procedure: ESOPHAGOGASTRODUODENOSCOPY (EGD) WITH PROPOFOL;  Surgeon: [name], MD;  Location: [place];  Service: Endoscopy;  Laterality: N/A;  . EUS Left 12/17/2018   Procedure: UPPER ENDOSCOPIC ULTRASOUND (EUS) LINEAR;  Surgeon: [name], MD;  Location: [place];  Service: Endoscopy;  Laterality: Left;  . EYE SURGERY     "for bleeding in eye"  . HERNIA REPAIR    . LEFT HEART CATH AND CORONARY ANGIOGRAPHY N/A 11/04/2016   Procedure: Left Heart Cath and Coronary Angiography;  Surgeon: [name], MD;  Location: [place] CV LAB;  Service: Cardiovascular;  Laterality: N/A;  . LOWER EXTREMITY ANGIOGRAPHY N/A  11/04/2016   Procedure: Lower Extremity Angiography;  Surgeon: [name], MD;  Location: [place] CV LAB;  Service: Cardiovascular;  Laterality: N/A;  . LOWER EXTREMITY ANGIOGRAPHY N/A 01/03/2020   Procedure: LOWER EXTREMITY ANGIOGRAPHY;  Surgeon: [name], MD;  Location: [place] CV LAB;  Service: Cardiovascular;  Laterality: N/A;  . PROSTATE SURGERY    . REMOVAL OF STONES  12/21/2018   Procedure: REMOVAL OF STONES;  Surgeon: [name], MD;  Location: [facility] ENDOSCOPY;  Service: Endoscopy;;  . [name]  12/21/2018   Procedure: [name];  Surgeon: [name], MD;  Location: [place];  Service: Endoscopy;;    MEDICATIONS: . ACCU-CHEK  SMARTVIEW test strip  . Accu-Chek Softclix Lancets lancets  . acetaminophen (TYLENOL) 500 MG tablet  . AMITIZA 24 MCG capsule  . amLODipine (NORVASC) 2.5 MG tablet  . Ascorbic Acid (VITAMIN C PO)  . aspirin EC 81 MG tablet  . Blood Glucose Monitoring Suppl (ACCU-CHEK GUIDE) w/Device KIT  . chlorthalidone (HYGROTON) 50 MG tablet  . cholecalciferol (VITAMIN D3) 25 MCG (1000 UNIT) tablet  . clobetasol cream (TEMOVATE) 0.05 %  . clonazePAM (KLONOPIN) 0.5 MG tablet  . Cyanocobalamin (B-12 PO)  . ezetimibe (ZETIA) 10 MG tablet  . gabapentin (NEURONTIN) 300 MG capsule  . isosorbide mononitrate (IMDUR) 120 MG 24 hr tablet  . JANUVIA 100 MG tablet  . ketoconazole (NIZORAL) 2 % cream  . LANTUS SOLOSTAR 100 UNIT/ML Solostar Pen  . LINZESS 145 MCG CAPS capsule  . metoprolol tartrate (LOPRESSOR) 25 MG tablet  . olmesartan (BENICAR) 5 MG tablet  . omeprazole (PRILOSEC) 40 MG capsule  . Polyvinyl Alcohol-Povidone (REFRESH OP)  . Pyridoxine HCl (B-6 PO)  . simvastatin (ZOCOR) 20 MG tablet  . tamsulosin (FLOMAX) 0.4 MG CAPS capsule  . XARELTO 2.5 MG TABS tablet   No current facility-administered medications for this encounter.    Myra Gianotti, PA-C Surgical Short Stay/Anesthesiology [facility] Phone [phone] [facility] Phone [phone] 11/08/2020 9:26 AM

## 2020-11-08 ENCOUNTER — Encounter (HOSPITAL_COMMUNITY): Payer: Self-pay

## 2020-11-08 NOTE — Anesthesia Preprocedure Evaluation (Addendum)
Anesthesia Evaluation  Patient identified by MRN, date of birth, ID band Patient awake    Reviewed: Allergy & Precautions, NPO status , Patient's Chart, lab work & pertinent test results  History of Anesthesia Complications Negative for: history of anesthetic complications  Airway Mallampati: II  TM Distance: >3 FB Neck ROM: Full    Dental  (+) Dental Advisory Given   Pulmonary former smoker,    Pulmonary exam normal        Cardiovascular hypertension, Pt. on medications and Pt. on home beta blockers + CAD, + Past MI and + CABG  Normal cardiovascular exam   '22 Carotid US - In progress  '21 Carotid US - 91-50% RICAS, <41% LICAS  '21 Myoperfusion - SPECT images showed small sized, mild intensity, mildly reversible perfusion defect in apical to basal inferior myocardium. Stress LVEF 53%. Low risk study.     Neuro/Psych PSYCHIATRIC DISORDERS Anxiety negative neurological ROS     GI/Hepatic Neg liver ROS, GERD  Controlled and Medicated,  Endo/Other  diabetes, Type 2, Oral Hypoglycemic Agents, Insulin Dependent  Renal/GU CRFRenal disease    Prostate cancer     Musculoskeletal  (+) Arthritis ,   Abdominal   Peds  Hematology negative hematology ROS (+)   Anesthesia Other Findings Covid test negative   Reproductive/Obstetrics                           Anesthesia Physical Anesthesia Plan  ASA: III  Anesthesia Plan: General   Post-op Pain Management:    Induction: Intravenous  PONV Risk Score and Plan: 3 and Treatment may vary due to age or medical condition, Ondansetron and Propofol infusion  Airway Management Planned: Oral ETT  Additional Equipment: None  Intra-op Plan:   Post-operative Plan: Extubation in OR  Informed Consent: I have reviewed the patients History and Physical, chart, labs and discussed the procedure including the risks, benefits and alternatives for the  proposed anesthesia with the patient or authorized representative who has indicated his/her understanding and acceptance.     Dental advisory given  Plan Discussed with: CRNA and Anesthesiologist  Anesthesia Plan Comments:       Anesthesia Quick Evaluation

## 2020-11-09 ENCOUNTER — Encounter (HOSPITAL_COMMUNITY): Payer: Self-pay | Admitting: Neurosurgery

## 2020-11-09 ENCOUNTER — Encounter (HOSPITAL_COMMUNITY)
Admission: RE | Disposition: A | Discharge status: Home / Self Care | Payer: Self-pay | Source: Ambulatory Visit | Attending: Neurosurgery

## 2020-11-09 ENCOUNTER — Ambulatory Visit (HOSPITAL_COMMUNITY): Payer: Medicare HMO

## 2020-11-09 ENCOUNTER — Ambulatory Visit (HOSPITAL_COMMUNITY): Payer: Medicare HMO | Admitting: Anesthesiology

## 2020-11-09 ENCOUNTER — Observation Stay (HOSPITAL_COMMUNITY)
Admission: RE | Admit: 2020-11-09 | Discharge: 2020-11-10 | Disposition: A | Discharge status: Home / Self Care | Payer: Medicare HMO | Source: Ambulatory Visit | Attending: Neurosurgery | Admitting: Neurosurgery

## 2020-11-09 ENCOUNTER — Other Ambulatory Visit: Payer: Self-pay

## 2020-11-09 ENCOUNTER — Ambulatory Visit (HOSPITAL_COMMUNITY): Payer: Medicare HMO | Admitting: Vascular Surgery

## 2020-11-09 DIAGNOSIS — E1122 Type 2 diabetes mellitus with diabetic chronic kidney disease: Secondary | ICD-10-CM | POA: Insufficient documentation

## 2020-11-09 DIAGNOSIS — Z7901 Long term (current) use of anticoagulants: Secondary | ICD-10-CM | POA: Insufficient documentation

## 2020-11-09 DIAGNOSIS — E7800 Pure hypercholesterolemia, unspecified: Secondary | ICD-10-CM | POA: Diagnosis not present

## 2020-11-09 DIAGNOSIS — M48062 Spinal stenosis, lumbar region with neurogenic claudication: Principal | ICD-10-CM | POA: Diagnosis present

## 2020-11-09 DIAGNOSIS — Z7982 Long term (current) use of aspirin: Secondary | ICD-10-CM | POA: Insufficient documentation

## 2020-11-09 DIAGNOSIS — I779 Disorder of arteries and arterioles, unspecified: Secondary | ICD-10-CM | POA: Diagnosis not present

## 2020-11-09 DIAGNOSIS — Z87891 Personal history of nicotine dependence: Secondary | ICD-10-CM | POA: Diagnosis not present

## 2020-11-09 DIAGNOSIS — E11319 Type 2 diabetes mellitus with unspecified diabetic retinopathy without macular edema: Secondary | ICD-10-CM | POA: Insufficient documentation

## 2020-11-09 DIAGNOSIS — I252 Old myocardial infarction: Secondary | ICD-10-CM | POA: Diagnosis not present

## 2020-11-09 DIAGNOSIS — N189 Chronic kidney disease, unspecified: Secondary | ICD-10-CM | POA: Diagnosis not present

## 2020-11-09 DIAGNOSIS — Z79899 Other long term (current) drug therapy: Secondary | ICD-10-CM | POA: Diagnosis not present

## 2020-11-09 DIAGNOSIS — M4802 Spinal stenosis, cervical region: Secondary | ICD-10-CM | POA: Diagnosis not present

## 2020-11-09 DIAGNOSIS — Z794 Long term (current) use of insulin: Secondary | ICD-10-CM | POA: Diagnosis not present

## 2020-11-09 DIAGNOSIS — Z9889 Other specified postprocedural states: Secondary | ICD-10-CM | POA: Diagnosis not present

## 2020-11-09 DIAGNOSIS — M5126 Other intervertebral disc displacement, lumbar region: Secondary | ICD-10-CM | POA: Insufficient documentation

## 2020-11-09 DIAGNOSIS — C61 Malignant neoplasm of prostate: Secondary | ICD-10-CM | POA: Diagnosis not present

## 2020-11-09 DIAGNOSIS — I129 Hypertensive chronic kidney disease with stage 1 through stage 4 chronic kidney disease, or unspecified chronic kidney disease: Secondary | ICD-10-CM | POA: Diagnosis not present

## 2020-11-09 DIAGNOSIS — I951 Orthostatic hypotension: Secondary | ICD-10-CM | POA: Diagnosis not present

## 2020-11-09 DIAGNOSIS — Z419 Encounter for procedure for purposes other than remedying health state, unspecified: Secondary | ICD-10-CM

## 2020-11-09 HISTORY — PX: LUMBAR LAMINECTOMY/DECOMPRESSION MICRODISCECTOMY: SHX5026

## 2020-11-09 LAB — GLUCOSE, CAPILLARY
Glucose-Capillary: 93 mg/dL (ref 70–99)
Glucose-Capillary: 78 mg/dL (ref 70–99)
Glucose-Capillary: 63 mg/dL — ABNORMAL LOW (ref 70–99)
Glucose-Capillary: 65 mg/dL — ABNORMAL LOW (ref 70–99)
Glucose-Capillary: 198 mg/dL — ABNORMAL HIGH (ref 70–99)
Glucose-Capillary: 180 mg/dL — ABNORMAL HIGH (ref 70–99)

## 2020-11-09 SURGERY — LUMBAR LAMINECTOMY/DECOMPRESSION MICRODISCECTOMY 1 LEVEL
Anesthesia: General | Site: Spine Lumbar | Laterality: Bilateral

## 2020-11-09 MED ORDER — OXYCODONE HCL 5 MG/5ML PO SOLN
5.0000 mg | Freq: Once | ORAL | Status: DC | PRN
Start: 2020-11-09 — End: 2020-11-09

## 2020-11-09 MED ORDER — ONDANSETRON HCL 4 MG/2ML IJ SOLN
INTRAMUSCULAR | Status: AC
  Filled 2020-11-09: qty 2

## 2020-11-09 MED ORDER — ONDANSETRON HCL 4 MG/2ML IJ SOLN: 4.0000 mg | Freq: Four times a day (QID) | INTRAMUSCULAR | Status: DC | PRN

## 2020-11-09 MED ORDER — BUPIVACAINE HCL (PF) 0.25 % IJ SOLN
INTRAMUSCULAR | Status: DC | PRN
  Administered 2020-11-09: 20 mL

## 2020-11-09 MED ORDER — METOPROLOL TARTRATE 12.5 MG HALF TABLET
12.5000 mg | ORAL_TABLET | Freq: Two times a day (BID) | ORAL | Status: DC
  Administered 2020-11-09: 12.5 mg via ORAL
  Filled 2020-11-09: qty 1

## 2020-11-09 MED ORDER — AMLODIPINE BESYLATE 5 MG PO TABS: 2.5000 mg | ORAL_TABLET | Freq: Every day | ORAL | Status: DC

## 2020-11-09 MED ORDER — 0.9 % SODIUM CHLORIDE (POUR BTL) OPTIME
TOPICAL | Status: DC | PRN
  Administered 2020-11-09: 1000 mL

## 2020-11-09 MED ORDER — ONDANSETRON HCL 4 MG PO TABS: 4.0000 mg | ORAL_TABLET | Freq: Four times a day (QID) | ORAL | Status: DC | PRN

## 2020-11-09 MED ORDER — THROMBIN 5000 UNITS EX SOLR
CUTANEOUS | Status: AC
  Filled 2020-11-09: qty 10000

## 2020-11-09 MED ORDER — DEXAMETHASONE SODIUM PHOSPHATE 10 MG/ML IJ SOLN: 10.0000 mg | Freq: Once | INTRAMUSCULAR | Status: DC

## 2020-11-09 MED ORDER — ORAL CARE MOUTH RINSE: 15.0000 mL | Freq: Once | OROMUCOSAL | Status: AC

## 2020-11-09 MED ORDER — IRBESARTAN 75 MG PO TABS
37.5000 mg | ORAL_TABLET | Freq: Every day | ORAL | Status: DC
  Filled 2020-11-09: qty 0.5

## 2020-11-09 MED ORDER — CLONAZEPAM 0.5 MG PO TABS
0.5000 mg | ORAL_TABLET | Freq: Every day | ORAL | Status: DC | PRN
Start: 2020-11-09 — End: 2020-11-10

## 2020-11-09 MED ORDER — CEFAZOLIN SODIUM-DEXTROSE 1-4 GM/50ML-% IV SOLN
1.0000 g | Freq: Three times a day (TID) | INTRAVENOUS | Status: AC
  Administered 2020-11-09 – 2020-11-10 (×2): 1 g via INTRAVENOUS
  Filled 2020-11-09 (×2): qty 50

## 2020-11-09 MED ORDER — SIMVASTATIN 20 MG PO TABS: 20.0000 mg | ORAL_TABLET | Freq: Every day | ORAL | Status: DC

## 2020-11-09 MED ORDER — FENTANYL CITRATE (PF) 100 MCG/2ML IJ SOLN
INTRAMUSCULAR | Status: AC
  Filled 2020-11-09: qty 2

## 2020-11-09 MED ORDER — CHLORHEXIDINE GLUCONATE CLOTH 2 % EX PADS: 6.0000 | MEDICATED_PAD | Freq: Once | CUTANEOUS | Status: DC

## 2020-11-09 MED ORDER — PROPOFOL 10 MG/ML IV BOLUS
INTRAVENOUS | Status: DC | PRN
  Administered 2020-11-09: 150 mg via INTRAVENOUS

## 2020-11-09 MED ORDER — FENTANYL CITRATE (PF) 100 MCG/2ML IJ SOLN
25.0000 ug | INTRAMUSCULAR | Status: DC | PRN
  Administered 2020-11-09: 50 ug via INTRAVENOUS
  Administered 2020-11-09: 25 ug via INTRAVENOUS

## 2020-11-09 MED ORDER — PROPOFOL 10 MG/ML IV BOLUS
INTRAVENOUS | Status: AC
  Filled 2020-11-09: qty 20

## 2020-11-09 MED ORDER — LINACLOTIDE 145 MCG PO CAPS: 145.0000 ug | ORAL_CAPSULE | Freq: Every day | ORAL | Status: DC | PRN

## 2020-11-09 MED ORDER — LACTATED RINGERS IV SOLN: INTRAVENOUS | Status: DC | PRN

## 2020-11-09 MED ORDER — INSULIN GLARGINE 100 UNIT/ML ~~LOC~~ SOLN
15.0000 [IU] | Freq: Every day | SUBCUTANEOUS | Status: DC
  Administered 2020-11-09: 15 [IU] via SUBCUTANEOUS
  Filled 2020-11-09 (×2): qty 0.15

## 2020-11-09 MED ORDER — CHLORTHALIDONE 25 MG PO TABS
25.0000 mg | ORAL_TABLET | Freq: Every day | ORAL | Status: DC
  Filled 2020-11-09: qty 1

## 2020-11-09 MED ORDER — CYCLOBENZAPRINE HCL 10 MG PO TABS
10.0000 mg | ORAL_TABLET | Freq: Three times a day (TID) | ORAL | Status: DC | PRN
  Administered 2020-11-09 – 2020-11-10 (×3): 10 mg via ORAL
  Filled 2020-11-09 (×2): qty 1

## 2020-11-09 MED ORDER — LUBIPROSTONE 24 MCG PO CAPS
24.0000 ug | ORAL_CAPSULE | ORAL | Status: DC | PRN
  Filled 2020-11-09: qty 1

## 2020-11-09 MED ORDER — LINAGLIPTIN 5 MG PO TABS
5.0000 mg | ORAL_TABLET | Freq: Every day | ORAL | Status: DC
  Filled 2020-11-09: qty 1

## 2020-11-09 MED ORDER — HYDROCODONE-ACETAMINOPHEN 10-325 MG PO TABS
2.0000 | ORAL_TABLET | ORAL | Status: DC | PRN
  Administered 2020-11-10: 2 via ORAL
  Filled 2020-11-09: qty 2

## 2020-11-09 MED ORDER — HEMOSTATIC AGENTS (NO CHARGE) OPTIME
TOPICAL | Status: DC | PRN
  Administered 2020-11-09: 1 via TOPICAL

## 2020-11-09 MED ORDER — LUBIPROSTONE 24 MCG PO CAPS: 24.0000 ug | ORAL_CAPSULE | ORAL | Status: DC | PRN

## 2020-11-09 MED ORDER — ACETAMINOPHEN 10 MG/ML IV SOLN
INTRAVENOUS | Status: AC
  Filled 2020-11-09: qty 100

## 2020-11-09 MED ORDER — FENTANYL CITRATE (PF) 250 MCG/5ML IJ SOLN
INTRAMUSCULAR | Status: AC
  Filled 2020-11-09: qty 5

## 2020-11-09 MED ORDER — VITAMIN D 25 MCG (1000 UNIT) PO TABS: 1000.0000 [IU] | ORAL_TABLET | Freq: Every day | ORAL | Status: DC

## 2020-11-09 MED ORDER — ROCURONIUM BROMIDE 10 MG/ML (PF) SYRINGE
PREFILLED_SYRINGE | INTRAVENOUS | Status: AC
  Filled 2020-11-09: qty 10

## 2020-11-09 MED ORDER — CHLORHEXIDINE GLUCONATE 0.12 % MT SOLN
15.0000 mL | Freq: Once | OROMUCOSAL | Status: AC
  Administered 2020-11-09: 15 mL via OROMUCOSAL
  Filled 2020-11-09: qty 15

## 2020-11-09 MED ORDER — FENTANYL CITRATE (PF) 250 MCG/5ML IJ SOLN
INTRAMUSCULAR | Status: DC | PRN
  Administered 2020-11-09: 100 ug via INTRAVENOUS

## 2020-11-09 MED ORDER — LIDOCAINE 2% (20 MG/ML) 5 ML SYRINGE
INTRAMUSCULAR | Status: AC
  Filled 2020-11-09: qty 5

## 2020-11-09 MED ORDER — ASPIRIN EC 81 MG PO TBEC: 81.0000 mg | DELAYED_RELEASE_TABLET | Freq: Every day | ORAL | Status: DC

## 2020-11-09 MED ORDER — LIDOCAINE 2% (20 MG/ML) 5 ML SYRINGE
INTRAMUSCULAR | Status: DC | PRN
  Administered 2020-11-09: 100 mg via INTRAVENOUS

## 2020-11-09 MED ORDER — ACETAMINOPHEN 10 MG/ML IV SOLN
INTRAVENOUS | Status: DC | PRN
  Administered 2020-11-09: 1000 mg via INTRAVENOUS

## 2020-11-09 MED ORDER — ONDANSETRON HCL 4 MG/2ML IJ SOLN
INTRAMUSCULAR | Status: DC | PRN
  Administered 2020-11-09: 4 mg via INTRAVENOUS

## 2020-11-09 MED ORDER — PHENYLEPHRINE HCL-NACL 10-0.9 MG/250ML-% IV SOLN
INTRAVENOUS | Status: DC | PRN
  Administered 2020-11-09: 50 ug/min via INTRAVENOUS

## 2020-11-09 MED ORDER — GABAPENTIN 300 MG PO CAPS
300.0000 mg | ORAL_CAPSULE | Freq: Two times a day (BID) | ORAL | Status: DC
  Administered 2020-11-09: 300 mg via ORAL
  Filled 2020-11-09: qty 1

## 2020-11-09 MED ORDER — CYCLOBENZAPRINE HCL 10 MG PO TABS
ORAL_TABLET | ORAL | Status: AC
  Filled 2020-11-09: qty 1

## 2020-11-09 MED ORDER — EZETIMIBE 10 MG PO TABS
10.0000 mg | ORAL_TABLET | Freq: Every day | ORAL | Status: DC
  Administered 2020-11-09: 10 mg via ORAL
  Filled 2020-11-09: qty 1

## 2020-11-09 MED ORDER — LACTATED RINGERS IV SOLN: INTRAVENOUS | Status: DC

## 2020-11-09 MED ORDER — SODIUM CHLORIDE 0.9 % IV SOLN: 250.0000 mL | INTRAVENOUS | Status: DC

## 2020-11-09 MED ORDER — PROPOFOL 1000 MG/100ML IV EMUL
INTRAVENOUS | Status: AC
  Filled 2020-11-09: qty 100

## 2020-11-09 MED ORDER — BUPIVACAINE HCL (PF) 0.25 % IJ SOLN
INTRAMUSCULAR | Status: AC
  Filled 2020-11-09: qty 30

## 2020-11-09 MED ORDER — PANTOPRAZOLE SODIUM 40 MG PO TBEC: 40.0000 mg | DELAYED_RELEASE_TABLET | Freq: Every day | ORAL | Status: DC

## 2020-11-09 MED ORDER — ASCORBIC ACID 500 MG PO TABS
500.0000 mg | ORAL_TABLET | Freq: Every day | ORAL | Status: DC
  Filled 2020-11-09: qty 1

## 2020-11-09 MED ORDER — SUGAMMADEX SODIUM 200 MG/2ML IV SOLN
INTRAVENOUS | Status: DC | PRN
  Administered 2020-11-09: 200 mg via INTRAVENOUS

## 2020-11-09 MED ORDER — PROPOFOL 500 MG/50ML IV EMUL
INTRAVENOUS | Status: DC | PRN
  Administered 2020-11-09: 25 ug/kg/min via INTRAVENOUS

## 2020-11-09 MED ORDER — OXYCODONE HCL 5 MG PO TABS: 5.0000 mg | ORAL_TABLET | Freq: Once | ORAL | Status: DC | PRN

## 2020-11-09 MED ORDER — ISOSORBIDE MONONITRATE ER 60 MG PO TB24
120.0000 mg | ORAL_TABLET | Freq: Every day | ORAL | Status: DC
  Filled 2020-11-09: qty 2

## 2020-11-09 MED ORDER — EPHEDRINE SULFATE-NACL 50-0.9 MG/10ML-% IV SOSY
PREFILLED_SYRINGE | INTRAVENOUS | Status: DC | PRN
  Administered 2020-11-09 (×4): 10 mg via INTRAVENOUS

## 2020-11-09 MED ORDER — INSULIN ASPART 100 UNIT/ML ~~LOC~~ SOLN
0.0000 [IU] | Freq: Three times a day (TID) | SUBCUTANEOUS | Status: DC
  Administered 2020-11-09: 3 [IU] via SUBCUTANEOUS

## 2020-11-09 MED ORDER — PHENYLEPHRINE 40 MCG/ML (10ML) SYRINGE FOR IV PUSH (FOR BLOOD PRESSURE SUPPORT)
PREFILLED_SYRINGE | INTRAVENOUS | Status: DC | PRN
  Administered 2020-11-09 (×5): 80 ug via INTRAVENOUS

## 2020-11-09 MED ORDER — TAMSULOSIN HCL 0.4 MG PO CAPS: 0.4000 mg | ORAL_CAPSULE | Freq: Every day | ORAL | Status: DC

## 2020-11-09 MED ORDER — THROMBIN 5000 UNITS EX SOLR
CUTANEOUS | Status: DC | PRN
  Administered 2020-11-09 (×2): 5000 [IU] via TOPICAL

## 2020-11-09 MED ORDER — EPHEDRINE 5 MG/ML INJ
INTRAVENOUS | Status: AC
  Filled 2020-11-09: qty 10

## 2020-11-09 MED ORDER — INSULIN GLARGINE 100 UNIT/ML ~~LOC~~ SOLN
50.0000 [IU] | Freq: Every day | SUBCUTANEOUS | Status: DC
  Filled 2020-11-09: qty 0.5

## 2020-11-09 MED ORDER — SODIUM CHLORIDE 0.9% FLUSH
3.0000 mL | Freq: Two times a day (BID) | INTRAVENOUS | Status: DC
  Administered 2020-11-09: 3 mL via INTRAVENOUS

## 2020-11-09 MED ORDER — HYDROCODONE-ACETAMINOPHEN 5-325 MG PO TABS
1.0000 | ORAL_TABLET | ORAL | Status: DC | PRN
  Administered 2020-11-09: 1 via ORAL
  Filled 2020-11-09: qty 1

## 2020-11-09 MED ORDER — ACETAMINOPHEN 325 MG PO TABS: 650.0000 mg | ORAL_TABLET | ORAL | Status: DC | PRN

## 2020-11-09 MED ORDER — PHENOL 1.4 % MT LIQD: 1.0000 | OROMUCOSAL | Status: DC | PRN

## 2020-11-09 MED ORDER — ONDANSETRON HCL 4 MG/2ML IJ SOLN: 4.0000 mg | Freq: Once | INTRAMUSCULAR | Status: DC | PRN

## 2020-11-09 MED ORDER — ROCURONIUM BROMIDE 10 MG/ML (PF) SYRINGE
PREFILLED_SYRINGE | INTRAVENOUS | Status: DC | PRN
  Administered 2020-11-09: 60 mg via INTRAVENOUS

## 2020-11-09 MED ORDER — CEFAZOLIN SODIUM-DEXTROSE 2-4 GM/100ML-% IV SOLN
2.0000 g | INTRAVENOUS | Status: AC
  Administered 2020-11-09: 2 g via INTRAVENOUS
  Filled 2020-11-09: qty 100

## 2020-11-09 MED ORDER — KETOROLAC TROMETHAMINE 30 MG/ML IJ SOLN
INTRAMUSCULAR | Status: AC
  Filled 2020-11-09: qty 1

## 2020-11-09 MED ORDER — HYDROMORPHONE HCL 1 MG/ML IJ SOLN
1.0000 mg | INTRAMUSCULAR | Status: DC | PRN
  Administered 2020-11-09 (×2): 1 mg via INTRAVENOUS
  Filled 2020-11-09 (×2): qty 1

## 2020-11-09 MED ORDER — SODIUM CHLORIDE 0.9% FLUSH: 3.0000 mL | INTRAVENOUS | Status: DC | PRN

## 2020-11-09 MED ORDER — ACETAMINOPHEN 650 MG RE SUPP: 650.0000 mg | RECTAL | Status: DC | PRN

## 2020-11-09 MED ORDER — INSULIN GLARGINE 100 UNIT/ML SOLOSTAR PEN: 15.0000 [IU] | PEN_INJECTOR | SUBCUTANEOUS | Status: DC

## 2020-11-09 MED ORDER — MENTHOL 3 MG MT LOZG: 1.0000 | LOZENGE | OROMUCOSAL | Status: DC | PRN

## 2020-11-09 MED ORDER — PHENYLEPHRINE 40 MCG/ML (10ML) SYRINGE FOR IV PUSH (FOR BLOOD PRESSURE SUPPORT)
PREFILLED_SYRINGE | INTRAVENOUS | Status: AC
  Filled 2020-11-09: qty 10

## 2020-11-09 SURGICAL SUPPLY — 46 items
BAG DECANTER FOR FLEXI CONT (MISCELLANEOUS) IMPLANT
BAND RUBBER #18 3X1/16 STRL (MISCELLANEOUS) IMPLANT
BENZOIN TINCTURE PRP APPL 2/3 (GAUZE/BANDAGES/DRESSINGS) ×2 IMPLANT
BLADE CLIPPER SURG (BLADE) IMPLANT
BUR CUTTER 7.0 ROUND (BURR) ×2 IMPLANT
CANISTER SUCT 3000ML PPV (MISCELLANEOUS) ×2 IMPLANT
CARTRIDGE OIL MAESTRO DRILL (MISCELLANEOUS) ×1 IMPLANT
COVER WAND RF STERILE (DRAPES) IMPLANT
DECANTER SPIKE VIAL GLASS SM (MISCELLANEOUS) ×2 IMPLANT
DERMABOND ADVANCED (GAUZE/BANDAGES/DRESSINGS) ×1
DERMABOND ADVANCED .7 DNX12 (GAUZE/BANDAGES/DRESSINGS) ×1 IMPLANT
DIFFUSER DRILL AIR PNEUMATIC (MISCELLANEOUS) ×2 IMPLANT
DRAPE HALF SHEET 40X57 (DRAPES) IMPLANT
DRAPE LAPAROTOMY 100X72X124 (DRAPES) ×2 IMPLANT
DRAPE MICROSCOPE LEICA (MISCELLANEOUS) IMPLANT
DRAPE SURG 17X23 STRL (DRAPES) ×4 IMPLANT
DRSG OPSITE POSTOP 3X4 (GAUZE/BANDAGES/DRESSINGS) ×2 IMPLANT
DURAPREP 26ML APPLICATOR (WOUND CARE) ×2 IMPLANT
ELECT REM PT RETURN 9FT ADLT (ELECTROSURGICAL) ×2
ELECTRODE REM PT RTRN 9FT ADLT (ELECTROSURGICAL) ×1 IMPLANT
GAUZE 4X4 16PLY RFD (DISPOSABLE) IMPLANT
GAUZE SPONGE 4X4 12PLY STRL (GAUZE/BANDAGES/DRESSINGS) IMPLANT
GLOVE BIO SURGEON STRL SZ 6.5 (GLOVE) ×10 IMPLANT
GLOVE ECLIPSE 9.0 STRL (GLOVE) ×2 IMPLANT
GLOVE EXAM NITRILE XL STR (GLOVE) IMPLANT
GLOVE SURG UNDER POLY LF SZ6.5 (GLOVE) ×8 IMPLANT
GOWN STRL REUS W/ TWL LRG LVL3 (GOWN DISPOSABLE) ×3 IMPLANT
GOWN STRL REUS W/ TWL XL LVL3 (GOWN DISPOSABLE) ×1 IMPLANT
GOWN STRL REUS W/TWL 2XL LVL3 (GOWN DISPOSABLE) IMPLANT
GOWN STRL REUS W/TWL LRG LVL3 (GOWN DISPOSABLE) ×3
GOWN STRL REUS W/TWL XL LVL3 (GOWN DISPOSABLE) ×1
KIT BASIN OR (CUSTOM PROCEDURE TRAY) ×2 IMPLANT
KIT TURNOVER KIT B (KITS) ×2 IMPLANT
NEEDLE HYPO 22GX1.5 SAFETY (NEEDLE) ×2 IMPLANT
NEEDLE SPNL 22GX3.5 QUINCKE BK (NEEDLE) ×2 IMPLANT
NS IRRIG 1000ML POUR BTL (IV SOLUTION) ×2 IMPLANT
OIL CARTRIDGE MAESTRO DRILL (MISCELLANEOUS) ×2
PACK LAMINECTOMY NEURO (CUSTOM PROCEDURE TRAY) ×2 IMPLANT
PAD ARMBOARD 7.5X6 YLW CONV (MISCELLANEOUS) ×10 IMPLANT
SPONGE SURGIFOAM ABS GEL SZ50 (HEMOSTASIS) ×2 IMPLANT
STRIP CLOSURE SKIN 1/2X4 (GAUZE/BANDAGES/DRESSINGS) ×2 IMPLANT
SUT VIC AB 2-0 CT1 18 (SUTURE) ×2 IMPLANT
SUT VIC AB 3-0 SH 8-18 (SUTURE) ×2 IMPLANT
TOWEL GREEN STERILE (TOWEL DISPOSABLE) ×2 IMPLANT
TOWEL GREEN STERILE FF (TOWEL DISPOSABLE) ×2 IMPLANT
WATER STERILE IRR 1000ML POUR (IV SOLUTION) ×2 IMPLANT

## 2020-11-09 NOTE — Brief Op Note (Signed)
11/09/2020  9:44 AM  PATIENT:  Justin [name]  87 y.o. male  PRE-OPERATIVE DIAGNOSIS:  Stenosis  POST-OPERATIVE DIAGNOSIS:  Stenosis  PROCEDURE:  Procedure(s) with comments: Laminectomy and Foraminotomy - bilateral - Lumbar Four-Five. (Bilateral) - posterior  SURGEON:  Surgeon(s) and Role:    * Earnie Larsson, MD - Primary  PHYSICIAN ASSISTANT:   ASSISTANTSMearl Latin   ANESTHESIA:   general  EBL:  150 mL   BLOOD ADMINISTERED:none  DRAINS: none   LOCAL MEDICATIONS USED:  MARCAINE     SPECIMEN:  No Specimen  DISPOSITION OF SPECIMEN:  N/A  COUNTS:  YES  TOURNIQUET:  * No tourniquets in log *  DICTATION: .Dragon Dictation  PLAN OF CARE: Admit for overnight observation  PATIENT DISPOSITION:  PACU - hemodynamically stable.   Delay start of Pharmacological VTE agent (>24hrs) due to surgical blood loss or risk of bleeding: yes

## 2020-11-09 NOTE — H&P (Signed)
Justin [name] is an 87 y.o. male.   Chief Complaint: Back pain HPI: 87 year old male with severe back and bilateral lower extremity pain numbness and weakness right greater than left.  Work-up demonstrates evidence of critical spinal stenosis at L4-5 secondary to spondylosis and a broad-based disc herniation L4-5.  Patient presents now for bilateral L4-5 decompressive laminotomies and probable right-sided L4-5 microdiscectomy.  Past Medical History:  Diagnosis Date  . Anxiety   . Arthritis   . Carotid arterial disease ([place])   . CKD (chronic kidney disease)   . Coronary artery disease   . Diabetes mellitus without complication ([place])   . Diabetic retinopathy ([place])    NPDR OU  . Diverticulitis   . Dyspnea   . GERD (gastroesophageal reflux disease)   . History of kidney stones 2021  . Hypercholesteremia   . Hypertension   . Hypertensive retinopathy    OU  . Myocardial infarction ([place]) 2009  . Pneumonia    as a child  . Prostate cancer ([place])   . UTI (lower urinary tract infection)     Past Surgical History:  Procedure Laterality Date  . ABDOMINAL SURGERY     for diverticulitis; also removed appendix  . APPENDECTOMY    . CARDIAC CATHETERIZATION  2018  . CATARACT EXTRACTION    . CATARACT EXTRACTION, BILATERAL    . CHOLECYSTECTOMY N/A 10/12/2017   Procedure: LAPAROSCOPIC CHOLECYSTECTOMY;  Surgeon: [name], MD;  Location: [facility];  Service: General;  Laterality: N/A;  . CORONARY ARTERY BYPASS GRAFT  2009  . ERCP N/A 12/21/2018   Procedure: ENDOSCOPIC RETROGRADE CHOLANGIOPANCREATOGRAPHY (ERCP);  Surgeon: [name], MD;  Location: [facility];  Service: Endoscopy;  Laterality: N/A;  . ESOPHAGOGASTRODUODENOSCOPY (EGD) WITH PROPOFOL N/A 12/17/2018   Procedure: ESOPHAGOGASTRODUODENOSCOPY (EGD) WITH PROPOFOL;  Surgeon: [name], MD;  Location: [facility];  Service: Endoscopy;  Laterality: N/A;  . EUS Left 12/17/2018   Procedure: UPPER ENDOSCOPIC ULTRASOUND (EUS)  LINEAR;  Surgeon: [name], MD;  Location: [facility];  Service: Endoscopy;  Laterality: Left;  . EYE SURGERY     "for bleeding in eye"  . HERNIA REPAIR    . LEFT HEART CATH AND CORONARY ANGIOGRAPHY N/A 11/04/2016   Procedure: Left Heart Cath and Coronary Angiography;  Surgeon: [name], MD;  Location: [facility];  Service: Cardiovascular;  Laterality: N/A;  . LOWER EXTREMITY ANGIOGRAPHY N/A 11/04/2016   Procedure: Lower Extremity Angiography;  Surgeon: [name], MD;  Location: [facility];  Service: Cardiovascular;  Laterality: N/A;  . LOWER EXTREMITY ANGIOGRAPHY N/A 01/03/2020   Procedure: LOWER EXTREMITY ANGIOGRAPHY;  Surgeon: [name], MD;  Location: [facility];  Service: Cardiovascular;  Laterality: N/A;  . PROSTATE SURGERY    . REMOVAL OF STONES  12/21/2018   Procedure: REMOVAL OF STONES;  Surgeon: [name], MD;  Location: [facility];  Service: Endoscopy;;  . [name]  12/21/2018   Procedure: [name];  Surgeon: [name], MD;  Location: [facility] ENDOSCOPY;  Service: Endoscopy;;    Family History  Problem Relation Age of Onset  . Diabetes Father   . Diabetes Maternal Aunt   . Diabetes Maternal Uncle   . Diabetes Maternal Grandmother   . Heart disease Sister   . Diabetes Brother   . Heart disease Sister    Social History:  reports that he has quit smoking. His smoking use included cigarettes. He has a 0.50 pack-year smoking history. He has never used smokeless tobacco. He reports that  he does not drink alcohol and does not use drugs.  Allergies:  Allergies  Allergen Reactions  . Contrast Media [Iodinated Diagnostic Agents] Itching    PT STATES ALLERGY TO "CT DYE".  [name] [Red Dye] Swelling    CAT scan dye  . Ibuprofen Other (See Comments)    Upset stomach     Medications Prior to Admission  Medication Sig Dispense Refill  . acetaminophen (TYLENOL) 500 MG tablet Take 1,000 mg by mouth 2 (two) times daily as needed for  mild pain.    [place] Kitchen AMITIZA 24 MCG capsule Take 24 mcg by mouth as needed for constipation.     Marland Kitchen amLODipine (NORVASC) 2.5 MG tablet Take 1 tablet (2.5 mg total) by mouth daily. 30 tablet 3  . Ascorbic Acid (VITAMIN C PO) Take 1 tablet by mouth daily.    Marland Kitchen aspirin EC 81 MG tablet Take 81 mg by mouth daily.    . chlorthalidone (HYGROTON) 50 MG tablet Take 25 mg by mouth daily.     . cholecalciferol (VITAMIN D3) 25 MCG (1000 UNIT) tablet Take 1,000 Units by mouth daily.    . clonazePAM (KLONOPIN) 0.5 MG tablet Take 0.5 mg by mouth daily as needed for anxiety.    . Cyanocobalamin (B-12 PO) Take 1 capsule by mouth daily.    Marland Kitchen ezetimibe (ZETIA) 10 MG tablet Take 1 tablet (10 mg total) by mouth daily after supper. 90 tablet 3  . gabapentin (NEURONTIN) 300 MG capsule Take 300 mg by mouth 2 (two) times daily.    . isosorbide mononitrate (IMDUR) 120 MG 24 hr tablet Take 1 tablet (120 mg total) by mouth daily. 90 tablet 2  . JANUVIA 100 MG tablet Take 100 mg by mouth daily.     Marland Kitchen ketoconazole (NIZORAL) 2 % cream Apply 1 application topically 2 (two) times daily.    Marland Kitchen LANTUS SOLOSTAR 100 UNIT/ML Solostar Pen Inject 15-50 Units into the skin See admin instructions. Inject 50 units in the morning and 15 units in the evening    . LINZESS 145 MCG CAPS capsule Take 145 mcg by mouth daily as needed (constipation).    . metoprolol tartrate (LOPRESSOR) 25 MG tablet Take 0.5 tablets (12.5 mg total) by mouth 2 (two) times daily. 90 tablet 3  . olmesartan (BENICAR) 5 MG tablet Take 5 mg by mouth daily.    Marland Kitchen omeprazole (PRILOSEC) 40 MG capsule Take 40 mg by mouth daily.     . Polyvinyl Alcohol-Povidone (REFRESH OP) Place 1 drop into both eyes 3 (three) times daily as needed (dry eyes).    . Pyridoxine HCl (B-6 PO) Take 1 capsule by mouth daily.    . simvastatin (ZOCOR) 20 MG tablet Take 20 mg by mouth daily.    . tamsulosin (FLOMAX) 0.4 MG CAPS capsule Take 0.4 mg by mouth daily.    [name] 2.5 MG TABS tablet TAKE 1  TABLET TWICE DAILY (Patient taking differently: Take 2.5 mg by mouth 2 (two) times daily.) 180 tablet 3  . ACCU-CHEK SMARTVIEW test strip     . Accu-Chek Softclix Lancets lancets     . Blood Glucose Monitoring Suppl (ACCU-CHEK GUIDE) w/Device KIT     . clobetasol cream (TEMOVATE) 0.05 %       Results for orders placed or performed during the hospital encounter of 11/09/20 (from the past 48 hour(s))  Glucose, capillary     Status: None   Collection Time: 11/09/20  6:34 AM  Result  Value Ref Range   Glucose-Capillary 93 70 - 99 mg/dL    Comment: Glucose reference range applies only to samples taken after fasting for at least 8 hours.   No results found.  Pertinent items noted in HPI and remainder of comprehensive ROS otherwise negative.  Blood pressure (!) 135/52, pulse (!) 55, temperature 97.8 F (36.6 C), temperature source Temporal, resp. rate 18, height '5\' 7"'  (1.702 m), weight 83.9 kg, SpO2 99 %.  Patient is awake and alert.  He is oriented and appropriate.  Speech is fluent.  Judgment insight are intact.  Cranial nerve function normal bilateral.  Motor examination of the extremities reveals weakness of dorsiflexion on the right side grading at 4-/5.  Sensory examination with decrease sensation pinprick light touch in his right L5 dermatome.  Deep interfaces normal active scepter his Achilles reflexes are absent bilaterally.  Gait is antalgic.  Posture is flexed peer examination head ears eyes nose and throat is unremarked.  Chest and abdomen are benign.  Extremities are free of major deformity. Assessment/Plan L4-5 severe stenosis with right-sided L45 disc herniation.  Plan bilateral L4-5 decompressive laminotomies and foraminotomies with probable right-sided L4-5 microdiscectomy.  Risks and benefits of been explained.  Patient wishes to proceed.  Mallie Mussel A Linzy Laury 11/09/2020, 7:57 AM

## 2020-11-09 NOTE — Progress Notes (Signed)
Primary Physician:  [name], MD   Patient ID: Justin [name], male    DOB: January 18, 1933, 87 y.o.   MRN: [id number]  I connected with the patient on 11/13/20 by a telephone call and verified that I am speaking with the correct person using two identifiers.     I offered the patient a video enabled application for a virtual visit. Unfortunately, this could not be accomplished due to technical difficulties/lack of video enabled phone/computer. I discussed the limitations of evaluation and management by telemedicine and the availability of in person appointments. The patient expressed understanding and agreed to proceed.   This visit type was conducted due to national recommendations for restrictions regarding the COVID-19 Pandemic (e.g. social distancing).  This format is felt to be most appropriate for this patient at this time.  All issues noted in this document were discussed and addressed.  No physical exam was performed (except for noted visual exam findings with Tele health visits).  The patient has consented to conduct a Tele health visit and understands insurance will be billed.    Chief Complaint  Patient presents with  . Hypertension  . Follow-up   HPI:    Justin [name]  is a 87 y.o. male  male with history of CAD, S/P CABG in 2009, PAD, type II diabetes with autonomic insufficiency and autonomic orthostatic hypotension, asymptomatic carotid stenosis, and hyperlipidemia.  He has patent graft except occluded SVG to RCA which is diffusely diseased small vessel and also severe below-knee bilateral lower disease by angiography on 11/04/2016 and repeat peripheral arteriogram on 01/03/20 and has severe slow flow suggestive of microvascular disease.   Patient presents for 6 week follow up of hypertension. At last visit reduced Lopressor to 12.5 mg in view of bradycardia and added olmesartan. However, patient renal function dropped and therefore stopped olmesartan and started  amlodipine 2.5 mg daily. Patient reports he is feeling tired and sore today as he underwent L4-L5 laminectomy on 11/09/2020. He denies chest pain, palpitations, dyspnea, syncope, near syncope, leg swelling.  His blood pressure remains uncontrolled with home readings elevated averaging 150-160/80s mmHg with heart rate 55-65 bpm. However in reviewing his hospital records from recent surgery, blood pressure was well controlled while in the hospital both prior to and following surgery, suspect high salt intake may be contributing to elevated blood pressure.   Past Medical History:  Diagnosis Date  . Anxiety   . Arthritis   . Carotid arterial disease ([place])   . CKD (chronic kidney disease)   . Coronary artery disease   . Diabetes mellitus without complication ([place])   . Diabetic retinopathy ([place])    NPDR OU  . Diverticulitis   . Dyspnea   . GERD (gastroesophageal reflux disease)   . History of kidney stones 2021  . Hypercholesteremia   . Hypertension   . Hypertensive retinopathy    OU  . Myocardial infarction ([place]) 2009  . Pneumonia    as a child  . Prostate cancer ([place])   . UTI (lower urinary tract infection)     Past Surgical History:  Procedure Laterality Date  . ABDOMINAL SURGERY     for diverticulitis; also removed appendix  . APPENDECTOMY    . CARDIAC CATHETERIZATION  2018  . CATARACT EXTRACTION    . CATARACT EXTRACTION, BILATERAL    . CHOLECYSTECTOMY N/A 10/12/2017   Procedure: LAPAROSCOPIC CHOLECYSTECTOMY;  Surgeon: [name], MD;  Location: [facility];  Service: General;  Laterality: N/A;  .  CORONARY ARTERY BYPASS GRAFT  2009  . ERCP N/A 12/21/2018   Procedure: ENDOSCOPIC RETROGRADE CHOLANGIOPANCREATOGRAPHY (ERCP);  Surgeon: [name], MD;  Location: [facility];  Service: Endoscopy;  Laterality: N/A;  . ESOPHAGOGASTRODUODENOSCOPY (EGD) WITH PROPOFOL N/A 12/17/2018   Procedure: ESOPHAGOGASTRODUODENOSCOPY (EGD) WITH PROPOFOL;  Surgeon: [name], MD;  Location: [facility];  Service: Endoscopy;  Laterality: N/A;  . EUS Left 12/17/2018   Procedure: UPPER ENDOSCOPIC ULTRASOUND (EUS) LINEAR;  Surgeon: [name], MD;  Location: [facility];  Service: Endoscopy;  Laterality: Left;  . EYE SURGERY     "for bleeding in eye"  . HERNIA REPAIR    . LEFT HEART CATH AND CORONARY ANGIOGRAPHY N/A 11/04/2016   Procedure: Left Heart Cath and Coronary Angiography;  Surgeon: [name], MD;  Location: [facility];  Service: Cardiovascular;  Laterality: N/A;  . LOWER EXTREMITY ANGIOGRAPHY N/A 11/04/2016   Procedure: Lower Extremity Angiography;  Surgeon: [name], MD;  Location: [facility];  Service: Cardiovascular;  Laterality: N/A;  . LOWER EXTREMITY ANGIOGRAPHY N/A 01/03/2020   Procedure: LOWER EXTREMITY ANGIOGRAPHY;  Surgeon: [name], MD;  Location: [facility];  Service: Cardiovascular;  Laterality: N/A;  . LUMBAR LAMINECTOMY/DECOMPRESSION MICRODISCECTOMY Bilateral 11/09/2020   Procedure: Laminectomy and Foraminotomy - bilateral - Lumbar Four-Five.;  Surgeon: Earnie Larsson, MD;  Location: [facility];  Service: Neurosurgery;  Laterality: Bilateral;  posterior  . PROSTATE SURGERY    . REMOVAL OF STONES  12/21/2018   Procedure: REMOVAL OF STONES;  Surgeon: [name], MD;  Location: [facility] ENDOSCOPY;  Service: Endoscopy;;  . [name]  12/21/2018   Procedure: [name];  Surgeon: [name], MD;  Location: [facility] ENDOSCOPY;  Service: Endoscopy;;   Social History   Tobacco Use  . Smoking status: Former Smoker    Packs/day: 0.25    Years: 2.00    Pack years: 0.50    Types: Cigarettes  . Smokeless tobacco: Never Used  . Tobacco comment: when he was a teenage age [age]  Substance Use Topics  . Alcohol use: No    Marital Status: Widowed  ROS   Review of Systems  Constitutional: Positive for malaise/fatigue. Negative for weight gain.  Cardiovascular: Negative for chest pain, claudication, dyspnea on exertion, leg swelling, near-syncope,  orthopnea, palpitations, paroxysmal nocturnal dyspnea and syncope.  Respiratory: Negative for shortness of breath.   Hematologic/Lymphatic: Does not bruise/bleed easily.  Musculoskeletal: Positive for back pain and muscle weakness.  Gastrointestinal: Negative for melena.  Neurological: Negative for dizziness and weakness.  All other systems reviewed and are negative.  Objective  Blood pressure (!) 165/72, pulse 75, height _0  (1.702 m), weight 185 lb (83.9 kg). Body mass index is 28.98 kg/m.  Vitals with BMI 11/13/2020 11/10/2020 11/10/2020  Height _1  - -  Weight 185 lbs - -  BMI 21.30 - -  Systolic 865 784 696  Diastolic 72 55 65  Pulse 75 106 68   Physical exam is limited by telehealth visit. Vitals are reported by patient using home monitors.   Physical exam from last office visit on 10/02/2020:  Physical Exam Vitals reviewed.  Constitutional:      Appearance: He is well-developed.  Cardiovascular:     Rate and Rhythm: Normal rate and regular rhythm.     Pulses: Intact distal pulses.          Carotid pulses are on the right side with bruit and on the left side with bruit.      Femoral  pulses are 2+ on the right side and 2+ on the left side.      Popliteal pulses are 1+ on the right side and 1+ on the left side.       Dorsalis pedis pulses are 0 on the right side and 0 on the left side.       Posterior tibial pulses are 0 on the right side and 0 on the left side.     Heart sounds: Normal heart sounds.     Comments: Split S2. NO JVD. No leg edema Pulmonary:     Effort: Pulmonary effort is normal. No accessory muscle usage or respiratory distress.     Breath sounds: Normal breath sounds.  Abdominal:     General: Bowel sounds are normal.     Palpations: Abdomen is soft.     Tenderness: There is no abdominal tenderness.  Skin:    Capillary Refill: Capillary refill takes 2 to 3 seconds.    Laboratory examination:    CMP Latest Ref Rng & Units 11/06/2020 10/09/2020  01/03/2020  Glucose 70 - 99 mg/dL 134(H) 203(H) 124(H)  BUN 8 - 23 mg/dL 30(H) 24 36(H)  Creatinine 0.61 - 1.24 mg/dL 1.64(H) 1.66(H) 1.50(H)  Sodium 135 - 145 mmol/L 139 141 143  Potassium 3.5 - 5.1 mmol/L 4.6 4.6 5.1  Chloride 98 - 111 mmol/L 112(H) 109(H) 110  CO2 22 - 32 mmol/L 19(L) 17(L) -  Calcium 8.9 - 10.3 mg/dL 9.2 9.3 -  Total Protein 6.0 - 8.5 g/dL - - -  Total Bilirubin 0.0 - 1.2 mg/dL - - -  Alkaline Phos 39 - 117 IU/L - - -  AST 0 - 40 IU/L - - -  ALT 0 - 44 IU/L - - -   CBC Latest Ref Rng & Units 11/06/2020 01/03/2020 12/19/2019  WBC 4.0 - 10.5 K/uL 7.6 - 9.1  Hemoglobin 13.0 - 17.0 g/dL 13.4 12.9(L) 13.2  Hematocrit 39.0 - 52.0 % 43.3 38.0(L) 41.8  Platelets 150 - 400 K/uL 184 - 203   Lipid Panel     Component Value Date/Time   CHOL 162 03/02/2020 0819   TRIG 66 03/02/2020 0819   HDL 63 03/02/2020 0819   LDLCALC 86 03/02/2020 0819   HEMOGLOBIN A1C Lab Results  Component Value Date   HGBA1C 7.2 (H) 11/06/2020   MPG 159.94 11/06/2020   TSH No results for input(s): TSH in the last 8760 hours.   External labs:  Cholesterol, total 155.000 M 03/14/2019 HDL 61.000 M 03/14/2019 LDL 105 NHDL 94 Triglycerides 67.000 M 03/14/2019   A1C 7.100 % 06/08/2019; TSH 1.530 03/14/2019  Hemoglobin 12.600 G/ 07/04/2019; INR 1.300 12/14/2018 Platelets 198.000 X 07/04/2019  Creatinine, Serum 1.700 MG/ 07/04/2019 Potassium 4.100 03/21/2019 Magnesium 2.000 MG/ 09/17/2016 ALT (SGPT) 16.000 03/21/2019  BNP 46.800 PG 10/13/2016  Medication and allergies    Allergies  Allergen Reactions  . Contrast Media [Iodinated Diagnostic Agents] Itching    PT STATES ALLERGY TO "CT DYE".  [name] [Red Dye] Swelling    CAT scan dye  . Ibuprofen Other (See Comments)    Upset stomach   Needing hospital admission for 24 hours and eventually discharged home the following day. Current Outpatient Medications  Medication Instructions  . ACCU-CHEK SMARTVIEW test strip No dose, route, or  frequency recorded.  . Accu-Chek Softclix Lancets lancets No dose, route, or frequency recorded.  Marland Kitchen acetaminophen (TYLENOL) 1,000 mg, Oral, 2 times daily PRN  . Amitiza 24 mcg, Oral, As  needed  . amLODipine (NORVASC) 2.5 mg, Oral, Daily  . Ascorbic Acid (VITAMIN C PO) 1 tablet, Oral, Daily  . aspirin EC 81 mg, Oral, Daily  . Blood Glucose Monitoring Suppl (ACCU-CHEK GUIDE) w/Device KIT No dose, route, or frequency recorded.  . chlorthalidone (HYGROTON) 25 mg, Oral, Daily  . cholecalciferol (VITAMIN D3) 1,000 Units, Oral, Daily  . clobetasol cream (TEMOVATE) 0.05 % No dose, route, or frequency recorded.  . clonazePAM (KLONOPIN) 0.5 mg, Oral, Daily PRN  . Cyanocobalamin (B-12 PO) 1 capsule, Oral, Daily  . ezetimibe (ZETIA) 10 mg, Oral, Daily after supper  . gabapentin (NEURONTIN) 300 mg, Oral, 2 times daily  . HYDROcodone-acetaminophen (NORCO/VICODIN) 5-325 MG tablet 1 tablet, Oral, Every 4 hours PRN  . isosorbide mononitrate (IMDUR) 120 mg, Oral, Daily  . Januvia 100 mg, Oral, Daily  . ketoconazole (NIZORAL) 2 % cream 1 application, Topical, 2 times daily  . Lantus SoloStar 15-50 Units, Subcutaneous, See admin instructions, Inject 50 units in the morning and 15 units in the evening  . methocarbamol (ROBAXIN) 500 mg, Oral, 4 times daily  . metoprolol tartrate (LOPRESSOR) 12.5 mg, Oral, 2 times daily  . omeprazole (PRILOSEC) 40 mg, Oral, Daily  . Polyvinyl Alcohol-Povidone (REFRESH OP) 1 drop, Both Eyes, 3 times daily PRN  . Pyridoxine HCl (B-6 PO) 1 capsule, Oral, Daily  . simvastatin (ZOCOR) 20 mg, Oral, Daily  . tamsulosin (FLOMAX) 0.4 mg, Oral, Daily  . vitamin B-12 (CYANOCOBALAMIN) 500 mcg, Oral, Daily  . XARELTO 2.5 MG TABS tablet TAKE 1 TABLET TWICE DAILY   Radiology:   No results found.   Cardiac Studies:   Lexiscan myoview stress test 04/17/2016: 1. Resting EKG demonstrates normal sinus rhythm, left axis deviation, right bundle branch block. Stress EKG is nondiagnostic  for ischemia as a pharmacologic stress test. There are frequent PACs during the stress test. Stress symptoms included dyspnea. 2. The Perfusion images reveal the left ventricle to be mildly dilated at 132 mL both in rest and stress images. There is a moderate-sized inferior and inferoapical scar with very mild peri-infarct ischemia noted especially towards the apex. Left ventricle systolic function was moderate to severely depressed at 36%. 3. This is an intermediate risk study, clinical correlation recommended.    Coronary angiogram 11/04/2016: Native RCA small and diffusely diseased with distal 70-80% stenosis. Occluded SVG to RCA. Patent SVG to OM-RI, LIMA to LAD. Medical therapy.  Echo- 12/24/2017 1. Left ventricle cavity is normal in size. Moderate concentric hypertrophy of the left ventricle. Normal global echocardiogram wall motion. Doppler evidence of grade I (impaired) diastolic dysfunction. Calculated EF 54%. 2. Left atrial cavity is mildly dilated. 3. Trileaflet aortic valve. Mild aortic valve leaflet calcification. 4. Mild to moderate mitral regurgitation. Mild calcification function of the mitral valve annulus. Mild mitral valve leaflet patient later uptitrated to calcification. 5. Trace tricuspid regurgitation.   ABI 05/02/2019: No hemodynamically significant stenoses are identified in the bilateral lower extremity arterial system.   Non-compressible ABI bilateral, suggestive of medial calcinosis. Moderately abnormal waveform right and mildly abnormal waveform left lower extremity at the level of the ankle. Study suggests small vessel disease.  Carotid artery duplex 10/11/2019:  Stenosis in the right internal carotid artery (50-69%). The right PSV  internal/common carotid artery ratio of 4.88 is consistent with a stenosis  of >70%. Stenosis in the right external carotid artery (<50%).  Stenosis in the left internal carotid artery (50-69%).  Stenosis in the left external carotid  artery (<50%).  Peak velocity  in the right ICA is 152/24 cm/s and left 203/27 cm/s. Antegrade right vertebral artery flow. Antegrade left vertebral artery  flow.  No significant change since 05/18/2019. Follow up in six months is  appropriate if clinically indicated.  Lower Extremity Arterial Duplex 11/30/2019:  No hemodynamically significant stenosis in bilateral lower extremity above  the knee.  Moderate velocity increase at the right mid posterior tibial artery.  suggests >50%stenosis.  Diffuse small vessel disease with moderately abnormal waveform right ankle  and severely abnormal waveform left ankle.  Non compressible vessels bilateral at the ankles suggests medial  calcinosis.   No significant change from 05/02/2019.  Peripheral arteriogram 01/03/2020: Distal abdominal aortogram with bifemoral arteriogram reveals widely patent iliac vessels and common femoral vessels.  Left SFA and left popliteal artery are widely patent with diffuse arterial sclerosis.  No luminal irregularity.  Below the left knee, two-vessel runoff in the form of PT and peroneal artery.  There is diffuse disease noted in both the peroneal and PT.  AT is occluded. Similarly on the right no significant disease in the SFA and there is probably a two-vessel runoff in the form of peroneal artery and PT.  AT is occluded. Recommendation: Patient has very slow flow suggestive of microvascular disease and also small vessel disease.  Unless critical limb ischemia no reason for angioplasty, continue medical therapy.  50 mill contrast utilized.  Carotid artery duplex 04/06/2020:  Stenosis in the right internal carotid artery (50-69%). The right PSV  internal/common carotid artery ratio of 4.56 is consistent with a stenosis  of >70%. Stenosis in the right external carotid artery (>50%).  Stenosis in the left common carotid artery (<50%). Stenosis in the left  external carotid artery (<50%).  Antegrade right vertebral artery  flow. Antegrade left vertebral artery  flow.  Follow up in six months is appropriate if clinically indicated. Compared  to 10/11/2019, no change in right ICA stenosis. Left ICSA stenosis was  50-69%  PCV MYOCARDIAL PERFUSION WITH LEXISCAN 09/24/2020 Lexiscan nuclear stress test performed using 1-day protocol. SPECT images showed small sized, mild intensity, mildly reversible perfusion defect in apical to basal inferior myocardium. Stress LVEF 53%. Low risk study.  Carotid artery duplex 11/07/2020: Results pending.   EKG   EKG 10/02/2020: Sinus bradycardia at a rate of 54 bpm with borderline first-degree AV block. Left axis deviation, left anterior fascicular block.  Right bundle branch block.  Nonspecific T wave abnormality.  Compared to EKG 11/14/2019, bradycardia new.  EKG 11/14/2019: Normal sinus rhythm at 74 bpm with 1 PAC, left axis deviation, left anterior fasicular block, RBBB. Nonspecific T wave abnormality.  Assessment:     ICD-10-CM   1. Primary hypertension  I10     No orders of the defined types were placed in this encounter.  Medications Discontinued During This Encounter  Medication Reason  . LINZESS 145 MCG CAPS capsule Error  . olmesartan (BENICAR) 5 MG tablet Side effect (s)    Recommendations:   Amaad Byers  is a 87 y.o. male with history of CAD, S/P CABG in 2009, PAD, type II diabetes with autonomic insufficiency and autonomic orthostatic hypotension, asymptomatic carotid stenosis, and hyperlipidemia.  He has patent graft except occluded SVG to RCA which is diffusely diseased small vessel and also severe below-knee bilateral lower disease by angiography on 11/04/2016 and repeat peripheral arteriogram on 01/03/20 and has severe slow flow suggestive of microvascular disease.    Patient presents for 6 week follow up of hypertension. Blood pressure remains  uncontrolled despite improvement of heart rate, now 75 bpm. Will continue reduced dose of metoprolol tartrate at  12.5 mg in view of advanced age and underlying bifascicular block. Will also continue chlorthalidone, Imdur, Lopressor. Will increase amlodipine from 2.5 mg to 5 mg daily. Advised patient to continue to monitor his blood pressure daily at home. Advised patient that if he tolerates amlodipine well and his blood pressure remains >140/90 mmHg, notify the office and we will consider increasing amlodipine further. Notably recent back surgery and pain may be contributing to elevated blood pressure.   Discussed with patient regarding th importance of compliance with DASH diet.    Alethia Berthold, PA-C 11/13/2020, 11:33 AM Office: [phone]

## 2020-11-09 NOTE — Anesthesia Postprocedure Evaluation (Signed)
Anesthesia Post Note  Patient: Justin [name]  Procedure(s) Performed: Laminectomy and Foraminotomy - bilateral - Lumbar Four-Five. (Bilateral Spine Lumbar)     Patient location during evaluation: PACU Anesthesia Type: General Level of consciousness: awake and alert Pain management: pain level controlled Vital Signs Assessment: post-procedure vital signs reviewed and stable Respiratory status: spontaneous breathing, nonlabored ventilation and respiratory function stable Cardiovascular status: blood pressure returned to baseline and stable Postop Assessment: no apparent nausea or vomiting Anesthetic complications: no   No complications documented.  Last Vitals:  Vitals:   11/09/20 1245 11/09/20 1258  BP: (!) 130/58 (!) 142/59  Pulse: (!) 46 (!) 47  Resp: 17 19  Temp:  36.5 C  SpO2: 95% 98%    Last Pain:  Vitals:   11/09/20 1200  TempSrc:   PainSc: [name]

## 2020-11-09 NOTE — Progress Notes (Signed)
Hypoglycemic Event  CBG: 65  Treatment: 8 oz juice/soda  Symptoms: None  Follow-up CBG: Time: CBG Result:  Possible Reasons for Event: inadequate intake  Comments/MD notified:notified 3C, they will recheck in 15 minutes    Alyson Ki M

## 2020-11-09 NOTE — Anesthesia Procedure Notes (Signed)
Procedure Name: Intubation Date/Time: 11/09/2020 8:20 AM Performed by: Harden Mo, CRNA Pre-anesthesia Checklist: Patient identified, Emergency Drugs available, Suction available and Patient being monitored Patient Re-evaluated:Patient Re-evaluated prior to induction Oxygen Delivery Method: Circle System Utilized Preoxygenation: Pre-oxygenation with 100% oxygen Induction Type: IV induction Ventilation: Mask ventilation without difficulty Laryngoscope Size: Miller and 2 Grade View: Grade I Tube type: Oral Tube size: 7.5 mm Number of attempts: 1 Airway Equipment and Method: Stylet and Oral airway Placement Confirmation: ETT inserted through vocal cords under direct vision,  positive ETCO2 and breath sounds checked- equal and bilateral Secured at: 23 cm Tube secured with: Tape Dental Injury: Teeth and Oropharynx as per pre-operative assessment

## 2020-11-09 NOTE — Op Note (Signed)
Date of procedure: 11/09/2020  Date of dictation: Same  Service: Neurosurgery  Preoperative diagnosis: Lumbar stenosis with neurogenic claudication, right L4-5 herniated nucleus pulposus  Postoperative diagnosis: Same  Procedure Name: Bilateral L4-5 decompressive laminotomies and foraminotomies, right L4-5 microdiscectomy  Surgeon:Kielee Care A.Aryam Zhan, M.D.  Asst. Surgeon: Reinaldo Meeker, NP  Anesthesia: General  Indication: 87 year old male with severe back and bilateral lower extremity pain numbness and weakness right greater than left.  Work-up demonstrates evidence of significant spondylosis with moderately severe spinal stenosis with a superimposed right-sided L4-5 disc herniation.  Patient presents now for decompressive surgery in hopes of improving his symptoms.  Operative note: After induction of anesthesia, patient positioned prone on the Wilson frame and properly padded.  Lumbar region prepped and draped sterilely.  Incision made overlying L4-5.  Dissection performed bilaterally.  Retractor placed.  X-ray taken.  Levels confirmed.  Decompressive laminotomies were then performed using high-speed drill and Kerrison rongeurs to remove the inferior aspect of the lamina of L4 the medial aspect of the L4-5 facet joint and the superior rim of the L5 lamina.  Ligament flavum elevated and resected.  Foraminotomies completed along course the exiting L4 and L5 nerve roots bilaterally.  Microscope brought in field used for microdissection of the spinal canal.  Working on the patient's right side thecal sac and L5 nerve root gently mobilized retract towards midline.  The disc base was broadly herniated with a bulging annulus.  The disc space was incised.  The disc space was entered.  All elements of the disc herniation were resected.  All loose or obviously degenerative disc tear was removed in the interspace.  At this point a very thorough discectomy been achieved.  There was no evidence of injury to the thecal  sac and nerve roots.  Wound is then irrigated.  Hemostasis assured.  Wound is then closed in layers of Vicryl sutures.  Steri-Strips and sterile dressing were applied.

## 2020-11-09 NOTE — Transfer of Care (Signed)
Immediate Anesthesia Transfer of Care Note  Patient: Justin [name]  Procedure(s) Performed: Laminectomy and Foraminotomy - bilateral - Lumbar Four-Five. (Bilateral Spine Lumbar)  Patient Location: PACU  Anesthesia Type:General  Level of Consciousness: awake and alert   Airway & Oxygen Therapy: Patient Spontanous Breathing  Post-op Assessment: Report given to RN, Post -op Vital signs reviewed and stable and Patient moving all extremities X 4  Post vital signs: Reviewed and stable  Last Vitals:  Vitals Value Taken Time  BP 129/53 11/09/20 0957  Temp    Pulse 55 11/09/20 0957  Resp 18 11/09/20 0957  SpO2 97 % 11/09/20 0957  Vitals shown include unvalidated device data.  Last Pain:  Vitals:   11/09/20 0635  TempSrc:   PainSc: 8       Patients Stated Pain Goal: 2 (03/55/97 4163)  Complications: No complications documented.

## 2020-11-10 ENCOUNTER — Encounter (HOSPITAL_COMMUNITY): Payer: Self-pay | Admitting: Neurosurgery

## 2020-11-10 DIAGNOSIS — E1122 Type 2 diabetes mellitus with diabetic chronic kidney disease: Secondary | ICD-10-CM | POA: Diagnosis not present

## 2020-11-10 DIAGNOSIS — E11319 Type 2 diabetes mellitus with unspecified diabetic retinopathy without macular edema: Secondary | ICD-10-CM | POA: Diagnosis not present

## 2020-11-10 DIAGNOSIS — M48062 Spinal stenosis, lumbar region with neurogenic claudication: Secondary | ICD-10-CM | POA: Diagnosis not present

## 2020-11-10 DIAGNOSIS — C61 Malignant neoplasm of prostate: Secondary | ICD-10-CM | POA: Diagnosis not present

## 2020-11-10 DIAGNOSIS — I252 Old myocardial infarction: Secondary | ICD-10-CM | POA: Diagnosis not present

## 2020-11-10 DIAGNOSIS — I779 Disorder of arteries and arterioles, unspecified: Secondary | ICD-10-CM | POA: Diagnosis not present

## 2020-11-10 DIAGNOSIS — M5126 Other intervertebral disc displacement, lumbar region: Secondary | ICD-10-CM | POA: Diagnosis not present

## 2020-11-10 DIAGNOSIS — I129 Hypertensive chronic kidney disease with stage 1 through stage 4 chronic kidney disease, or unspecified chronic kidney disease: Secondary | ICD-10-CM | POA: Diagnosis not present

## 2020-11-10 DIAGNOSIS — N189 Chronic kidney disease, unspecified: Secondary | ICD-10-CM | POA: Diagnosis not present

## 2020-11-10 LAB — GLUCOSE, CAPILLARY: Glucose-Capillary: 107 mg/dL — ABNORMAL HIGH (ref 70–99)

## 2020-11-10 MED ORDER — METHOCARBAMOL 500 MG PO TABS: 500.0000 mg | ORAL_TABLET | Freq: Four times a day (QID) | ORAL | 0 refills | Status: DC

## 2020-11-10 MED ORDER — HYDROCODONE-ACETAMINOPHEN 5-325 MG PO TABS: 1.0000 | ORAL_TABLET | ORAL | 0 refills | Status: DC | PRN

## 2020-11-10 NOTE — Evaluation (Signed)
Occupational Therapy Evaluation Patient Details Name: Justin [name] MRN: [id number] DOB: [birth year] Today's Date: 11/10/2020    History of Present Illness 87 y.o. male presenting with L4-5 R herniated nucleus puposus s/p decompressive laminotomies and foraminotomies with microdiscectomy. PMHx significant for anxiety, CAD, CKD, DM, HTN, MI, and Hx of prostate cancer.   Clinical Impression   PTA patient was living with his daughter in a private residence and was requiring supervision A grossly with ADLs using SPC. Patient currently presents below baseline level of function demonstrating need for Min A overall for ADLs and ADL transfers with use of RW. Patient with episode of bilateral knee buckling requiring Min A to correct. OT provided education on spinal precautions, home set-up to maximize safety and independence with self-care tasks, and acquisition/use of AE. Patient expressed verbal understanding but would benefit from continued education. No family present at bedside at time of evaluation. Patient would benefit from continued acute OT services in prep for safe d/c home with [place] and 24hr supervision/assist from family.     Follow Up Recommendations  Home health OT;Supervision/Assistance - 24 hour    Equipment Recommendations  Tub/shower bench    Recommendations for Other Services       Precautions / Restrictions Precautions Precautions: Fall;Back Precaution Booklet Issued: Yes (comment) Precaution Comments: PT provides reinforcement of back precautions Required Braces or Orthoses:  (no brace needed) Restrictions Weight Bearing Restrictions: No      Mobility Bed Mobility Overal bed mobility: Needs Assistance             General bed mobility comments: pt received and left in recliner    Transfers Overall transfer level: Needs assistance Equipment used: None Transfers: Sit to/from Stand Sit to Stand: Min guard         General transfer comment: pt takes multiple  attempts to power up into standing    Balance Overall balance assessment: Needs assistance Sitting-balance support: No upper extremity supported;Feet supported Sitting balance-Leahy Scale: Good     Standing balance support: No upper extremity supported Standing balance-Leahy Scale: Fair Standing balance comment: pt urinates without UE support                           ADL either performed or assessed with clinical judgement   ADL Overall ADL's : Needs assistance/impaired     Grooming: Minimal assistance;Standing Grooming Details (indicate cue type and reason): Min A for steadying. Patient requires at least unilateral UE support on sink surface to maintain balance. Episode of bilateral knee buckling requiring Min A to correct.         Upper Body Dressing : Minimal assistance;Sitting   Lower Body Dressing: Moderate assistance;Sit to/from stand Lower Body Dressing Details (indicate cue type and reason): Assist to thread BLE and maintain standing balance while hiking pants over hips. Patient with difficulty fastening button on pants but able to manipulate zipper and belt with increased time. Would likely require less assist with use of AE. Toilet Transfer: Minimal assistance;RW   Toileting- Clothing Manipulation and Hygiene: Minimal assistance;Sit to/from stand Toileting - Clothing Manipulation Details (indicate cue type and reason): 3/3 parts of toileting task with Min A for steadying.     Functional mobility during ADLs: Cane;Rolling walker;Minimal assistance;Moderate assistance General ADL Comments: Mod A for 2 steps with use of SPC and Min A for short-distance functional mobility in room with use of RW.     Vision Baseline Vision/History: Wears glasses Wears Glasses:  At all times Patient Visual Report: No change from baseline Vision Assessment?: No apparent visual deficits     Perception     Praxis      Pertinent Vitals/Pain Pain Assessment: 0-10 Pain  Score: 8  Pain Location: low back and RLE Pain Descriptors / Indicators: Aching Pain Intervention(s): Monitored during session     Hand Dominance Right   Extremity/Trunk Assessment Upper Extremity Assessment Upper Extremity Assessment: Defer to OT evaluation   Lower Extremity Assessment Lower Extremity Assessment: Generalized weakness   Cervical / Trunk Assessment Cervical / Trunk Assessment: Normal Cervical / Trunk Exceptions: s/p spinal surgery.   Communication Communication Communication: No difficulties   Cognition Arousal/Alertness: Awake/alert Behavior During Therapy: WFL for tasks assessed/performed Overall Cognitive Status: No family/caregiver present to determine baseline cognitive functioning                                 General Comments: pt with some reduced awareness of safety, electing to mobilize with cane initially despite being unsteady with cane and utilizing RW with OT recently prior to this session   General Comments  VSS on RA    Exercises     Shoulder Instructions      Home Living Family/patient expects to be discharged to:: Private residence Living Arrangements: Children;Other relatives (daughters, gdtr, Designer, industrial/product) Available Help at Discharge: Family;Available 24 hours/day Type of Home: House Home Access: Stairs to enter [employer] of Steps: 1 Entrance Stairs-Rails: None Home Layout: Two level;Able to live on main level with bedroom/bathroom     Bathroom Shower/Tub: Teacher, early years/pre: Standard Bathroom Accessibility: Yes How Accessible: Accessible via walker Home Equipment: [place] - single point;Walker - 2 wheels          Prior Functioning/Environment Level of Independence: Independent with assistive device(s)  Gait / Transfers Assistance Needed: Patient reports supervision A at baseline with functional mobility in home. Uses SPC. ADL's / Homemaking Assistance Needed: Supervision A  for ADL transfers. Patient reports ability to complete bathing/dressing/grooming without external assist.   Comments: pt reports ambulating with a cane at all times        OT Problem List: Decreased strength;Impaired balance (sitting and/or standing);Decreased cognition;Decreased safety awareness;Decreased knowledge of use of DME or AE;Decreased knowledge of precautions      OT Treatment/Interventions: Self-care/ADL training;Therapeutic exercise;Energy conservation;DME and/or AE instruction;Therapeutic activities;Patient/family education;Balance training    OT Goals(Current goals can be found in the care plan section) Acute Rehab OT Goals Patient Stated Goal: to reduce pain and improve mobility OT Goal Formulation: With patient Time For Goal Achievement: 11/24/20 Potential to Achieve Goals: Good ADL Goals Pt Will Perform Grooming: with modified independence;standing Pt Will Perform Upper Body Dressing: with modified independence;sitting Pt Will Perform Lower Body Dressing: sit to/from stand;with supervision Pt Will Transfer to Toilet: with supervision;ambulating;bedside commode Pt Will Perform Toileting - Clothing Manipulation and hygiene: with supervision;sit to/from stand Pt Will Perform Tub/Shower Transfer: Tub transfer;tub bench;rolling walker  OT Frequency: Min 2X/week   Barriers to D/C:            Co-evaluation              AM-PAC OT "6 Clicks" Daily Activity     Outcome Measure Help from another person eating meals?: None Help from another person taking care of personal grooming?: A Little Help from another person toileting, which includes using toliet, bedpan, or urinal?: A Little Help from another  person bathing (including washing, rinsing, drying)?: A Lot Help from another person to put on and taking off regular upper body clothing?: A Little Help from another person to put on and taking off regular lower body clothing?: A Lot 6 Click Score: 17   End of  Session Equipment Utilized During Treatment: Gait belt;Rolling walker Nurse Communication: Mobility status  Activity Tolerance: Patient tolerated treatment well Patient left: in chair;with call bell/phone within reach  OT Visit Diagnosis: Unsteadiness on feet (R26.81);Other abnormalities of gait and mobility (R26.89);Muscle weakness (generalized) (M62.81)                Time: 1610-9604 OT Time Calculation (min): 27 min Charges:  OT General Charges $OT Visit: 1 Visit OT Evaluation $OT Eval Moderate Complexity: 1 Mod OT Treatments $Self Care/Home Management : 8-22 mins  Regnia Mathwig H. OTR/L Supplemental OT, Department of rehab services [phone]  Elvia Aydin R H. 11/10/2020, 8:58 AM

## 2020-11-10 NOTE — Discharge Summary (Addendum)
Physician Discharge Summary  Patient ID: Justin [name] MRN: [id number] DOB/AGE: [birth year] 85 y.o.  Admit date: 11/09/2020 Discharge date: 11/10/2020  Admission Diagnoses: Stenosis  Discharge Diagnoses: Stenosis Active Problems:   Lumbar stenosis with neurogenic claudication   Discharged Condition: good  Hospital Course: The patient was admitted on 11/09/2020 and taken to the operating room where the patient underwent bilateral L4/5 laminectomy and foraminotomy. The patient tolerated the procedure well and was taken to the recovery room and then to the floor in stable condition. The hospital course was routine. There were no complications. The wound remained clean dry and intact. Pt had appropriate soreness. No complaints of leg pain or new N/T/W. The patient remained afebrile with stable vital signs, and tolerated a regular diet. The patient continued to increase activities, and pain was well controlled with oral pain medications.   Consults: None  Significant Diagnostic Studies: radiology: X-Ray  Treatments: surgery:   Discharge Exam: Blood pressure (!) 158/55, pulse (!) 106, temperature 98.2 F (36.8 C), resp. rate 18, height '5\' 7"'  (1.702 m), weight 83.9 kg, SpO2 98 %.  Physical Exam: Patient is awake, A/O X 4, conversant, and in good spirits. Speech is fluent and appropriate. Doing well. MAEW with good strength. Dressing is clean dry intact. Incision is well approximated with no drainage, erythema, or edema.  Disposition: Discharge disposition: 01-Home or Self Care        Allergies as of 11/10/2020      Reactions   Contrast Media [iodinated Diagnostic Agents] Itching   PT STATES ALLERGY TO "CT DYE".   Dye Fdc Red [red Dye] Swelling   CAT scan dye   Ibuprofen Other (See Comments)   Upset stomach       Medication List    TAKE these medications   Accu-Chek Guide w/Device Kit   Accu-Chek SmartView test strip Generic drug: glucose blood   Accu-Chek Softclix  Lancets lancets   acetaminophen 500 MG tablet Commonly known as: TYLENOL Take 1,000 mg by mouth 2 (two) times daily as needed for mild pain.   Amitiza 24 MCG capsule Generic drug: lubiprostone Take 24 mcg by mouth as needed for constipation.   amLODipine 2.5 MG tablet Commonly known as: NORVASC Take 1 tablet (2.5 mg total) by mouth daily.   aspirin EC 81 MG tablet Take 81 mg by mouth daily.   B-12 PO Take 1 capsule by mouth daily.   B-6 PO Take 1 capsule by mouth daily.   chlorthalidone 50 MG tablet Commonly known as: HYGROTON Take 25 mg by mouth daily.   cholecalciferol 25 MCG (1000 UNIT) tablet Commonly known as: VITAMIN D3 Take 1,000 Units by mouth daily.   clobetasol cream 0.05 % Commonly known as: TEMOVATE   clonazePAM 0.5 MG tablet Commonly known as: KLONOPIN Take 0.5 mg by mouth daily as needed for anxiety.   ezetimibe 10 MG tablet Commonly known as: ZETIA Take 1 tablet (10 mg total) by mouth daily after supper.   gabapentin 300 MG capsule Commonly known as: NEURONTIN Take 300 mg by mouth 2 (two) times daily.   HYDROcodone-acetaminophen 5-325 MG tablet Commonly known as: NORCO/VICODIN Take 1 tablet by mouth every 4 (four) hours as needed for moderate pain.   isosorbide mononitrate 120 MG 24 hr tablet Commonly known as: IMDUR Take 1 tablet (120 mg total) by mouth daily.   Januvia 100 MG tablet Generic drug: sitaGLIPtin Take 100 mg by mouth daily.   ketoconazole 2 % cream Commonly known as: NIZORAL Apply  1 application topically 2 (two) times daily.   Lantus SoloStar 100 UNIT/ML Solostar Pen Generic drug: insulin glargine Inject 15-50 Units into the skin See admin instructions. Inject 50 units in the morning and 15 units in the evening   Linzess 145 MCG Caps capsule Generic drug: linaclotide Take 145 mcg by mouth daily as needed (constipation).   methocarbamol 500 MG tablet Commonly known as: Robaxin Take 1 tablet (500 mg total) by mouth 4  (four) times daily.   metoprolol tartrate 25 MG tablet Commonly known as: LOPRESSOR Take 0.5 tablets (12.5 mg total) by mouth 2 (two) times daily.   olmesartan 5 MG tablet Commonly known as: BENICAR Take 5 mg by mouth daily.   omeprazole 40 MG capsule Commonly known as: PRILOSEC Take 40 mg by mouth daily.   REFRESH OP Place 1 drop into both eyes 3 (three) times daily as needed (dry eyes).   simvastatin 20 MG tablet Commonly known as: ZOCOR Take 20 mg by mouth daily.   tamsulosin 0.4 MG Caps capsule Commonly known as: FLOMAX Take 0.4 mg by mouth daily.   VITAMIN C PO Take 1 tablet by mouth daily.   Xarelto 2.5 MG Tabs tablet Generic drug: rivaroxaban TAKE 1 TABLET TWICE DAILY What changed: how much to take        Signed: Marvis Moeller, DNP, NP-C 11/10/2020, 8:46 AM

## 2020-11-10 NOTE — Progress Notes (Signed)
Patient is discharged from room 3C05 at this time. Alert and in stable condition. IV site d/c'd and instructions read to patient and daughter with understanding verbalized and all questions answered. Left unit via wheelchair with all belongings at side.

## 2020-11-10 NOTE — Evaluation (Signed)
Physical Therapy Evaluation Patient Details Name: Justin [name] MRN: [id number] DOB: May 12, 1933 Today's Date: 11/10/2020   History of Present Illness  87 y.o. male presenting with L4-5 R herniated nucleus puposus s/p decompressive laminotomies and foraminotomies with microdiscectomy. PMHx significant for anxiety, CAD, CKD, DM, HTN, MI, and Hx of prostate cancer.  Clinical Impression  Pt presents to PT with deficits in functional mobility, gait, balance, power, strength, endurance, and with pain. Pt ambulates slowly and benefits from BUE support of RW at this time to stabilize when mobilizing. Pt is limited by low back pain which radiates to RLE, but is able to tolerate a household level of mobility at this time. Pt will benefit from continued acute PT POC to improve mobility quality and tolerance. PT recommends discharge home with HHPT, use of the patient's RW and assistance from family for all out of bed mobility.    Follow Up Recommendations Home health PT;Supervision for mobility/OOB    Equipment Recommendations  None recommended by PT (pt reports owning RW at this time)    Recommendations for Other Services       Precautions / Restrictions Precautions Precautions: Fall;Back Precaution Booklet Issued: Yes (comment) Precaution Comments: PT provides reinforcement of back precautions Required Braces or Orthoses:  (no brace needed) Restrictions Weight Bearing Restrictions: No      Mobility  Bed Mobility               General bed mobility comments: pt received and left in recliner    Transfers Overall transfer level: Needs assistance Equipment used: None Transfers: Sit to/from Stand Sit to Stand: Min guard         General transfer comment: pt takes multiple attempts to power up into standing  Ambulation/Gait Ambulation/Gait assistance: Min guard Gait Distance (Feet): 100 Feet Assistive device: Rolling walker (2 wheeled);Straight cane Gait Pattern/deviations:  Step-to pattern Gait velocity: reduced Gait velocity interpretation: <1.8 ft/sec, indicate of risk for recurrent falls General Gait Details: pt with slowed step-to gait, initially with cane demonstrating increased lateral sway and reaching for objects to hold with LUE at all times. With use of RW pt is more steady, without loss of balance, although continues to demonstrate reduced gait speed  Stairs Stairs:  (PT discusses stair technique with RW, ascending with RW and L foot first)          Wheelchair Mobility    Modified Rankin (Stroke Patients Only)       Balance Overall balance assessment: Needs assistance Sitting-balance support: No upper extremity supported;Feet supported Sitting balance-Leahy Scale: Good     Standing balance support: No upper extremity supported Standing balance-Leahy Scale: Fair Standing balance comment: pt urinates without UE support                             Pertinent Vitals/Pain Pain Assessment: 0-10 Pain Score: 8  Pain Location: low back and RLE Pain Descriptors / Indicators: Aching Pain Intervention(s): Monitored during session    Home Living Family/patient expects to be discharged to:: Private residence Living Arrangements: Children;Other relatives (daughters, gdtr, Designer, industrial/product) Available Help at Discharge: Family;Available 24 hours/day Type of Home: House Home Access: Stairs to enter Entrance Stairs-Rails: None Entrance Stairs-Number of Steps: 1 Home Layout: Two level;Able to live on main level with bedroom/bathroom Home Equipment: [place] - single point;Walker - 2 wheels      Prior Function Level of Independence: Independent with assistive device(s)  Comments: pt reports ambulating with a cane at all times     Hand Dominance   Dominant Hand: Right    Extremity/Trunk Assessment   Upper Extremity Assessment Upper Extremity Assessment: Defer to OT evaluation    Lower Extremity Assessment Lower  Extremity Assessment: Generalized weakness    Cervical / Trunk Assessment Cervical / Trunk Assessment: Normal  Communication   Communication: No difficulties  Cognition Arousal/Alertness: Awake/alert Behavior During Therapy: WFL for tasks assessed/performed Overall Cognitive Status: No family/caregiver present to determine baseline cognitive functioning                                 General Comments: pt with some reduced awareness of safety, electing to mobilize with cane initially despite being unsteady with cane and utilizing RW with OT recently prior to this session      General Comments General comments (skin integrity, edema, etc.): VSS on RA    Exercises     Assessment/Plan    PT Assessment Patient needs continued PT services  PT Problem List Decreased strength;Decreased activity tolerance;Decreased balance;Decreased mobility;Decreased knowledge of use of DME;Decreased safety awareness;Decreased knowledge of precautions;Pain       PT Treatment Interventions DME instruction;Gait training;Functional mobility training;Therapeutic activities;Therapeutic exercise;Balance training;Neuromuscular re-education;Cognitive remediation;Patient/family education;Stair training    PT Goals (Current goals can be found in the Care Plan section)  Acute Rehab PT Goals Patient Stated Goal: to reduce pain and improve mobility PT Goal Formulation: With patient Time For Goal Achievement: 11/24/20 Potential to Achieve Goals: Good    Frequency Min 5X/week   Barriers to discharge        Co-evaluation               AM-PAC PT "6 Clicks" Mobility  Outcome Measure Help needed turning from your back to your side while in a flat bed without using bedrails?: A Little Help needed moving from lying on your back to sitting on the side of a flat bed without using bedrails?: A Little Help needed moving to and from a bed to a chair (including a wheelchair)?: A Little Help needed  standing up from a chair using your arms (e.g., wheelchair or bedside chair)?: A Little Help needed to walk in hospital room?: A Little Help needed climbing 3-5 steps with a railing? : A Little 6 Click Score: 18    End of Session   Activity Tolerance: Patient limited by pain Patient left: in chair;with call bell/phone within reach Nurse Communication: Mobility status PT Visit Diagnosis: Unsteadiness on feet (R26.81);Other abnormalities of gait and mobility (R26.89);Muscle weakness (generalized) (M62.81);Pain Pain - Right/Left: Right Pain - part of body: Leg;Hip    Time: 0488-8916 PT Time Calculation (min) (ACUTE ONLY): 21 min   Charges:   PT Evaluation $PT Eval Low Complexity: 1 Low          Zenaida Niece, PT, DPT Acute Rehabilitation Pager: [phone]   Zenaida Niece 11/10/2020, 8:48 AM

## 2020-11-10 NOTE — Discharge Instructions (Signed)

## 2020-11-10 NOTE — Progress Notes (Addendum)
Subjective: Patient reports that he is doing well and has had a significant reduction in his preoperative low back pain and BLE pain. He does note some mild incisional discomfort that is well controlled on PO analgesics. No acute events overnight.   Objective: Vital signs in last 24 hours: Temp:  [97.1 F (36.2 C)-99.4 F (37.4 C)] 98.2 F (36.8 C) (02/12 0734) Pulse Rate:  [43-106] 106 (02/12 0734) Resp:  [11-21] 18 (02/12 0734) BP: (104-178)/(52-68) 158/55 (02/12 0734) SpO2:  [90 %-100 %] 98 % (02/12 0734)  Intake/Output from previous day: 02/11 0701 - 02/12 0700 In: 1140 [P.O.:240; I.V.:700; IV Piggyback:200] Out: 500 [Urine:350; Blood:150] Intake/Output this shift: No intake/output data recorded.  Physical Exam: Patient is awake, A/O X 4, conversant, and in good spirits. Speech is fluent and appropriate. Doing well. MAEW with good strength. Dressing is clean dry intact. Incision is well approximated with no drainage, erythema, or edema.  Lab Results: No results for input(s): WBC, HGB, HCT, PLT in the last 72 hours. BMET No results for input(s): NA, K, CL, CO2, GLUCOSE, BUN, CREATININE, CALCIUM in the last 72 hours.  Studies/Results: DG Lumbar Spine 2-3 Views  Result Date: 11/09/2020 CLINICAL DATA:  Intraoperative localization EXAM: LUMBAR SPINE - 2 VIEW COMPARISON:  03/16/2020 FINDINGS: Two lateral films were obtained intraoperatively. The initial film shows a needle within the posterior soft tissues at the L4-5 level adjacent to the L5 spinous process. Second film shows surgical retractors and instruments at the L4-5 interspace consistent with the laminectomy. IMPRESSION: Intraoperative localization at L4-5 as described. Electronically Signed   By: Inez Catalina M.D.   On: 11/09/2020 10:54    Assessment/Plan: Patient is post-op day 1 s/p bilateral L4/5 laminectomy and foraminotomy. He is recovering well and reports a significant reduction in his preoperative symptoms.  His only  complaint is mild incisional discomfort.  He is awaiting PT/OT evaluation.  Continue LSO brace when OOB. Continue working on pain control, mobility and ambulating patient. Will plan for discharge today. Resume Xarelto on 11/14/2020.     LOS: 0 days     Marvis Moeller, DNP, NP-C 11/10/2020, 8:13 AM

## 2020-11-10 NOTE — TOC Transition Note (Signed)
Transition of Care [facility]) - CM/SW Discharge Note   Patient Details  Name: Justin [name] MRN: [id number] Date of Birth: [birth year]  [facility] CM/SW Contact:  Carles Collet, RN Phone Number: 11/10/2020, 9:33 AM   Clinical Narrative:   Damaris Schooner w patient at bedside. Discussed DC plan and HH ratings. Patient chose [facility], referral accepted. Staff to provide DME.     Final next level of care: Section     Patient Goals and CMS Choice Patient states their goals for this hospitalization and ongoing recovery are:: to go home CMS Medicare.gov Compare Post Acute Care list provided to:: Patient Choice offered to / list presented to : Patient  Discharge Placement                       Discharge Plan and Services                          HH Arranged: PT,OT [place]: [place] Date [place]: 11/10/20 Time [place]: [phone] Representative spoke with at [place]: [place] ([place]) Interventions     Readmission Risk Interventions No flowsheet data found.

## 2020-11-13 ENCOUNTER — Encounter: Payer: Self-pay | Admitting: Student

## 2020-11-13 ENCOUNTER — Other Ambulatory Visit: Payer: Self-pay

## 2020-11-13 ENCOUNTER — Other Ambulatory Visit: Payer: Self-pay | Admitting: Cardiology

## 2020-11-13 ENCOUNTER — Telehealth: Payer: Medicare HMO | Admitting: Student

## 2020-11-13 VITALS — BP 165/72 | HR 75 | Ht 67.0 in | Wt 185.0 lb

## 2020-11-13 DIAGNOSIS — I6523 Occlusion and stenosis of bilateral carotid arteries: Secondary | ICD-10-CM

## 2020-11-13 DIAGNOSIS — I10 Essential (primary) hypertension: Secondary | ICD-10-CM

## 2020-11-13 MED ORDER — AMLODIPINE BESYLATE 2.5 MG PO TABS: 5.0000 mg | ORAL_TABLET | Freq: Every day | ORAL | 3 refills | Status: DC

## 2020-11-14 DIAGNOSIS — N189 Chronic kidney disease, unspecified: Secondary | ICD-10-CM | POA: Diagnosis not present

## 2020-11-14 DIAGNOSIS — E1122 Type 2 diabetes mellitus with diabetic chronic kidney disease: Secondary | ICD-10-CM | POA: Diagnosis not present

## 2020-11-14 DIAGNOSIS — D631 Anemia in chronic kidney disease: Secondary | ICD-10-CM | POA: Diagnosis not present

## 2020-11-14 DIAGNOSIS — H35033 Hypertensive retinopathy, bilateral: Secondary | ICD-10-CM | POA: Diagnosis not present

## 2020-11-14 DIAGNOSIS — M48061 Spinal stenosis, lumbar region without neurogenic claudication: Secondary | ICD-10-CM | POA: Diagnosis not present

## 2020-11-14 DIAGNOSIS — I2510 Atherosclerotic heart disease of native coronary artery without angina pectoris: Secondary | ICD-10-CM | POA: Diagnosis not present

## 2020-11-14 DIAGNOSIS — F419 Anxiety disorder, unspecified: Secondary | ICD-10-CM | POA: Diagnosis not present

## 2020-11-14 DIAGNOSIS — Z4789 Encounter for other orthopedic aftercare: Secondary | ICD-10-CM | POA: Diagnosis not present

## 2020-11-14 DIAGNOSIS — I1310 Hypertensive heart and chronic kidney disease without heart failure, with stage 1 through stage 4 chronic kidney disease, or unspecified chronic kidney disease: Secondary | ICD-10-CM | POA: Diagnosis not present

## 2020-11-15 DIAGNOSIS — H35033 Hypertensive retinopathy, bilateral: Secondary | ICD-10-CM | POA: Diagnosis not present

## 2020-11-15 DIAGNOSIS — F419 Anxiety disorder, unspecified: Secondary | ICD-10-CM | POA: Diagnosis not present

## 2020-11-15 DIAGNOSIS — N189 Chronic kidney disease, unspecified: Secondary | ICD-10-CM | POA: Diagnosis not present

## 2020-11-15 DIAGNOSIS — E1122 Type 2 diabetes mellitus with diabetic chronic kidney disease: Secondary | ICD-10-CM | POA: Diagnosis not present

## 2020-11-15 DIAGNOSIS — I1310 Hypertensive heart and chronic kidney disease without heart failure, with stage 1 through stage 4 chronic kidney disease, or unspecified chronic kidney disease: Secondary | ICD-10-CM | POA: Diagnosis not present

## 2020-11-15 DIAGNOSIS — D631 Anemia in chronic kidney disease: Secondary | ICD-10-CM | POA: Diagnosis not present

## 2020-11-15 DIAGNOSIS — Z4789 Encounter for other orthopedic aftercare: Secondary | ICD-10-CM | POA: Diagnosis not present

## 2020-11-15 DIAGNOSIS — M48061 Spinal stenosis, lumbar region without neurogenic claudication: Secondary | ICD-10-CM | POA: Diagnosis not present

## 2020-11-15 DIAGNOSIS — I2510 Atherosclerotic heart disease of native coronary artery without angina pectoris: Secondary | ICD-10-CM | POA: Diagnosis not present

## 2020-11-19 DIAGNOSIS — E1122 Type 2 diabetes mellitus with diabetic chronic kidney disease: Secondary | ICD-10-CM | POA: Diagnosis not present

## 2020-11-19 DIAGNOSIS — D631 Anemia in chronic kidney disease: Secondary | ICD-10-CM | POA: Diagnosis not present

## 2020-11-19 DIAGNOSIS — Z4789 Encounter for other orthopedic aftercare: Secondary | ICD-10-CM | POA: Diagnosis not present

## 2020-11-19 DIAGNOSIS — I1310 Hypertensive heart and chronic kidney disease without heart failure, with stage 1 through stage 4 chronic kidney disease, or unspecified chronic kidney disease: Secondary | ICD-10-CM | POA: Diagnosis not present

## 2020-11-19 DIAGNOSIS — I2510 Atherosclerotic heart disease of native coronary artery without angina pectoris: Secondary | ICD-10-CM | POA: Diagnosis not present

## 2020-11-19 DIAGNOSIS — H35033 Hypertensive retinopathy, bilateral: Secondary | ICD-10-CM | POA: Diagnosis not present

## 2020-11-19 DIAGNOSIS — M48061 Spinal stenosis, lumbar region without neurogenic claudication: Secondary | ICD-10-CM | POA: Diagnosis not present

## 2020-11-19 DIAGNOSIS — N189 Chronic kidney disease, unspecified: Secondary | ICD-10-CM | POA: Diagnosis not present

## 2020-11-19 DIAGNOSIS — F419 Anxiety disorder, unspecified: Secondary | ICD-10-CM | POA: Diagnosis not present

## 2020-11-20 ENCOUNTER — Encounter (INDEPENDENT_AMBULATORY_CARE_PROVIDER_SITE_OTHER): Payer: Medicare HMO | Admitting: Ophthalmology

## 2020-11-20 DIAGNOSIS — D631 Anemia in chronic kidney disease: Secondary | ICD-10-CM | POA: Diagnosis not present

## 2020-11-20 DIAGNOSIS — N189 Chronic kidney disease, unspecified: Secondary | ICD-10-CM | POA: Diagnosis not present

## 2020-11-20 DIAGNOSIS — M48061 Spinal stenosis, lumbar region without neurogenic claudication: Secondary | ICD-10-CM | POA: Diagnosis not present

## 2020-11-20 DIAGNOSIS — Z4789 Encounter for other orthopedic aftercare: Secondary | ICD-10-CM | POA: Diagnosis not present

## 2020-11-20 DIAGNOSIS — H35033 Hypertensive retinopathy, bilateral: Secondary | ICD-10-CM | POA: Diagnosis not present

## 2020-11-20 DIAGNOSIS — F419 Anxiety disorder, unspecified: Secondary | ICD-10-CM | POA: Diagnosis not present

## 2020-11-20 DIAGNOSIS — I1310 Hypertensive heart and chronic kidney disease without heart failure, with stage 1 through stage 4 chronic kidney disease, or unspecified chronic kidney disease: Secondary | ICD-10-CM | POA: Diagnosis not present

## 2020-11-20 DIAGNOSIS — E1122 Type 2 diabetes mellitus with diabetic chronic kidney disease: Secondary | ICD-10-CM | POA: Diagnosis not present

## 2020-11-20 DIAGNOSIS — I2510 Atherosclerotic heart disease of native coronary artery without angina pectoris: Secondary | ICD-10-CM | POA: Diagnosis not present

## 2020-11-21 DIAGNOSIS — I1310 Hypertensive heart and chronic kidney disease without heart failure, with stage 1 through stage 4 chronic kidney disease, or unspecified chronic kidney disease: Secondary | ICD-10-CM | POA: Diagnosis not present

## 2020-11-21 DIAGNOSIS — E1122 Type 2 diabetes mellitus with diabetic chronic kidney disease: Secondary | ICD-10-CM | POA: Diagnosis not present

## 2020-11-21 DIAGNOSIS — N189 Chronic kidney disease, unspecified: Secondary | ICD-10-CM | POA: Diagnosis not present

## 2020-11-21 DIAGNOSIS — H35033 Hypertensive retinopathy, bilateral: Secondary | ICD-10-CM | POA: Diagnosis not present

## 2020-11-21 DIAGNOSIS — Z4789 Encounter for other orthopedic aftercare: Secondary | ICD-10-CM | POA: Diagnosis not present

## 2020-11-21 DIAGNOSIS — F419 Anxiety disorder, unspecified: Secondary | ICD-10-CM | POA: Diagnosis not present

## 2020-11-21 DIAGNOSIS — I2510 Atherosclerotic heart disease of native coronary artery without angina pectoris: Secondary | ICD-10-CM | POA: Diagnosis not present

## 2020-11-21 DIAGNOSIS — D631 Anemia in chronic kidney disease: Secondary | ICD-10-CM | POA: Diagnosis not present

## 2020-11-21 DIAGNOSIS — M48061 Spinal stenosis, lumbar region without neurogenic claudication: Secondary | ICD-10-CM | POA: Diagnosis not present

## 2020-11-22 DIAGNOSIS — F419 Anxiety disorder, unspecified: Secondary | ICD-10-CM | POA: Diagnosis not present

## 2020-11-22 DIAGNOSIS — E1122 Type 2 diabetes mellitus with diabetic chronic kidney disease: Secondary | ICD-10-CM | POA: Diagnosis not present

## 2020-11-22 DIAGNOSIS — I1310 Hypertensive heart and chronic kidney disease without heart failure, with stage 1 through stage 4 chronic kidney disease, or unspecified chronic kidney disease: Secondary | ICD-10-CM | POA: Diagnosis not present

## 2020-11-22 DIAGNOSIS — M48061 Spinal stenosis, lumbar region without neurogenic claudication: Secondary | ICD-10-CM | POA: Diagnosis not present

## 2020-11-22 DIAGNOSIS — D631 Anemia in chronic kidney disease: Secondary | ICD-10-CM | POA: Diagnosis not present

## 2020-11-22 DIAGNOSIS — I2510 Atherosclerotic heart disease of native coronary artery without angina pectoris: Secondary | ICD-10-CM | POA: Diagnosis not present

## 2020-11-22 DIAGNOSIS — N189 Chronic kidney disease, unspecified: Secondary | ICD-10-CM | POA: Diagnosis not present

## 2020-11-22 DIAGNOSIS — Z4789 Encounter for other orthopedic aftercare: Secondary | ICD-10-CM | POA: Diagnosis not present

## 2020-11-22 DIAGNOSIS — H35033 Hypertensive retinopathy, bilateral: Secondary | ICD-10-CM | POA: Diagnosis not present

## 2020-11-27 DIAGNOSIS — I2510 Atherosclerotic heart disease of native coronary artery without angina pectoris: Secondary | ICD-10-CM | POA: Diagnosis not present

## 2020-11-27 DIAGNOSIS — H35033 Hypertensive retinopathy, bilateral: Secondary | ICD-10-CM | POA: Diagnosis not present

## 2020-11-27 DIAGNOSIS — M48061 Spinal stenosis, lumbar region without neurogenic claudication: Secondary | ICD-10-CM | POA: Diagnosis not present

## 2020-11-27 DIAGNOSIS — N189 Chronic kidney disease, unspecified: Secondary | ICD-10-CM | POA: Diagnosis not present

## 2020-11-27 DIAGNOSIS — D631 Anemia in chronic kidney disease: Secondary | ICD-10-CM | POA: Diagnosis not present

## 2020-11-27 DIAGNOSIS — F419 Anxiety disorder, unspecified: Secondary | ICD-10-CM | POA: Diagnosis not present

## 2020-11-27 DIAGNOSIS — Z4789 Encounter for other orthopedic aftercare: Secondary | ICD-10-CM | POA: Diagnosis not present

## 2020-11-27 DIAGNOSIS — I1310 Hypertensive heart and chronic kidney disease without heart failure, with stage 1 through stage 4 chronic kidney disease, or unspecified chronic kidney disease: Secondary | ICD-10-CM | POA: Diagnosis not present

## 2020-11-27 DIAGNOSIS — E1122 Type 2 diabetes mellitus with diabetic chronic kidney disease: Secondary | ICD-10-CM | POA: Diagnosis not present

## 2020-12-03 DIAGNOSIS — I2510 Atherosclerotic heart disease of native coronary artery without angina pectoris: Secondary | ICD-10-CM | POA: Diagnosis not present

## 2020-12-03 DIAGNOSIS — Z4789 Encounter for other orthopedic aftercare: Secondary | ICD-10-CM | POA: Diagnosis not present

## 2020-12-03 DIAGNOSIS — D631 Anemia in chronic kidney disease: Secondary | ICD-10-CM | POA: Diagnosis not present

## 2020-12-03 DIAGNOSIS — M48061 Spinal stenosis, lumbar region without neurogenic claudication: Secondary | ICD-10-CM | POA: Diagnosis not present

## 2020-12-03 DIAGNOSIS — N189 Chronic kidney disease, unspecified: Secondary | ICD-10-CM | POA: Diagnosis not present

## 2020-12-03 DIAGNOSIS — F419 Anxiety disorder, unspecified: Secondary | ICD-10-CM | POA: Diagnosis not present

## 2020-12-03 DIAGNOSIS — I1310 Hypertensive heart and chronic kidney disease without heart failure, with stage 1 through stage 4 chronic kidney disease, or unspecified chronic kidney disease: Secondary | ICD-10-CM | POA: Diagnosis not present

## 2020-12-03 DIAGNOSIS — E1122 Type 2 diabetes mellitus with diabetic chronic kidney disease: Secondary | ICD-10-CM | POA: Diagnosis not present

## 2020-12-03 DIAGNOSIS — H35033 Hypertensive retinopathy, bilateral: Secondary | ICD-10-CM | POA: Diagnosis not present

## 2020-12-04 DIAGNOSIS — N189 Chronic kidney disease, unspecified: Secondary | ICD-10-CM | POA: Diagnosis not present

## 2020-12-04 DIAGNOSIS — I2510 Atherosclerotic heart disease of native coronary artery without angina pectoris: Secondary | ICD-10-CM | POA: Diagnosis not present

## 2020-12-04 DIAGNOSIS — E1122 Type 2 diabetes mellitus with diabetic chronic kidney disease: Secondary | ICD-10-CM | POA: Diagnosis not present

## 2020-12-04 DIAGNOSIS — H35033 Hypertensive retinopathy, bilateral: Secondary | ICD-10-CM | POA: Diagnosis not present

## 2020-12-04 DIAGNOSIS — F419 Anxiety disorder, unspecified: Secondary | ICD-10-CM | POA: Diagnosis not present

## 2020-12-04 DIAGNOSIS — Z4789 Encounter for other orthopedic aftercare: Secondary | ICD-10-CM | POA: Diagnosis not present

## 2020-12-04 DIAGNOSIS — M48061 Spinal stenosis, lumbar region without neurogenic claudication: Secondary | ICD-10-CM | POA: Diagnosis not present

## 2020-12-04 DIAGNOSIS — I1310 Hypertensive heart and chronic kidney disease without heart failure, with stage 1 through stage 4 chronic kidney disease, or unspecified chronic kidney disease: Secondary | ICD-10-CM | POA: Diagnosis not present

## 2020-12-04 DIAGNOSIS — D631 Anemia in chronic kidney disease: Secondary | ICD-10-CM | POA: Diagnosis not present

## 2020-12-05 ENCOUNTER — Other Ambulatory Visit: Payer: Self-pay

## 2020-12-06 DIAGNOSIS — H35033 Hypertensive retinopathy, bilateral: Secondary | ICD-10-CM | POA: Diagnosis not present

## 2020-12-06 DIAGNOSIS — N189 Chronic kidney disease, unspecified: Secondary | ICD-10-CM | POA: Diagnosis not present

## 2020-12-06 DIAGNOSIS — Z4789 Encounter for other orthopedic aftercare: Secondary | ICD-10-CM | POA: Diagnosis not present

## 2020-12-06 DIAGNOSIS — I2510 Atherosclerotic heart disease of native coronary artery without angina pectoris: Secondary | ICD-10-CM | POA: Diagnosis not present

## 2020-12-06 DIAGNOSIS — M48061 Spinal stenosis, lumbar region without neurogenic claudication: Secondary | ICD-10-CM | POA: Diagnosis not present

## 2020-12-06 DIAGNOSIS — I1310 Hypertensive heart and chronic kidney disease without heart failure, with stage 1 through stage 4 chronic kidney disease, or unspecified chronic kidney disease: Secondary | ICD-10-CM | POA: Diagnosis not present

## 2020-12-06 DIAGNOSIS — D631 Anemia in chronic kidney disease: Secondary | ICD-10-CM | POA: Diagnosis not present

## 2020-12-06 DIAGNOSIS — E1122 Type 2 diabetes mellitus with diabetic chronic kidney disease: Secondary | ICD-10-CM | POA: Diagnosis not present

## 2020-12-06 DIAGNOSIS — F419 Anxiety disorder, unspecified: Secondary | ICD-10-CM | POA: Diagnosis not present

## 2020-12-10 DIAGNOSIS — I2510 Atherosclerotic heart disease of native coronary artery without angina pectoris: Secondary | ICD-10-CM | POA: Diagnosis not present

## 2020-12-10 DIAGNOSIS — E1122 Type 2 diabetes mellitus with diabetic chronic kidney disease: Secondary | ICD-10-CM | POA: Diagnosis not present

## 2020-12-10 DIAGNOSIS — D631 Anemia in chronic kidney disease: Secondary | ICD-10-CM | POA: Diagnosis not present

## 2020-12-10 DIAGNOSIS — Z4789 Encounter for other orthopedic aftercare: Secondary | ICD-10-CM | POA: Diagnosis not present

## 2020-12-10 DIAGNOSIS — H35033 Hypertensive retinopathy, bilateral: Secondary | ICD-10-CM | POA: Diagnosis not present

## 2020-12-10 DIAGNOSIS — M48061 Spinal stenosis, lumbar region without neurogenic claudication: Secondary | ICD-10-CM | POA: Diagnosis not present

## 2020-12-10 DIAGNOSIS — N189 Chronic kidney disease, unspecified: Secondary | ICD-10-CM | POA: Diagnosis not present

## 2020-12-10 DIAGNOSIS — I1310 Hypertensive heart and chronic kidney disease without heart failure, with stage 1 through stage 4 chronic kidney disease, or unspecified chronic kidney disease: Secondary | ICD-10-CM | POA: Diagnosis not present

## 2020-12-10 DIAGNOSIS — F419 Anxiety disorder, unspecified: Secondary | ICD-10-CM | POA: Diagnosis not present

## 2020-12-11 DIAGNOSIS — I10 Essential (primary) hypertension: Secondary | ICD-10-CM | POA: Diagnosis not present

## 2020-12-11 DIAGNOSIS — E782 Mixed hyperlipidemia: Secondary | ICD-10-CM | POA: Diagnosis not present

## 2020-12-11 DIAGNOSIS — F419 Anxiety disorder, unspecified: Secondary | ICD-10-CM | POA: Diagnosis not present

## 2020-12-11 DIAGNOSIS — K581 Irritable bowel syndrome with constipation: Secondary | ICD-10-CM | POA: Diagnosis not present

## 2020-12-11 DIAGNOSIS — E1165 Type 2 diabetes mellitus with hyperglycemia: Secondary | ICD-10-CM | POA: Diagnosis not present

## 2020-12-11 DIAGNOSIS — E1142 Type 2 diabetes mellitus with diabetic polyneuropathy: Secondary | ICD-10-CM | POA: Diagnosis not present

## 2020-12-11 DIAGNOSIS — G629 Polyneuropathy, unspecified: Secondary | ICD-10-CM | POA: Diagnosis not present

## 2020-12-11 DIAGNOSIS — R1013 Epigastric pain: Secondary | ICD-10-CM | POA: Diagnosis not present

## 2020-12-11 DIAGNOSIS — N400 Enlarged prostate without lower urinary tract symptoms: Secondary | ICD-10-CM | POA: Diagnosis not present

## 2020-12-12 DIAGNOSIS — F419 Anxiety disorder, unspecified: Secondary | ICD-10-CM | POA: Diagnosis not present

## 2020-12-12 DIAGNOSIS — I1310 Hypertensive heart and chronic kidney disease without heart failure, with stage 1 through stage 4 chronic kidney disease, or unspecified chronic kidney disease: Secondary | ICD-10-CM | POA: Diagnosis not present

## 2020-12-12 DIAGNOSIS — E1122 Type 2 diabetes mellitus with diabetic chronic kidney disease: Secondary | ICD-10-CM | POA: Diagnosis not present

## 2020-12-12 DIAGNOSIS — I2510 Atherosclerotic heart disease of native coronary artery without angina pectoris: Secondary | ICD-10-CM | POA: Diagnosis not present

## 2020-12-12 DIAGNOSIS — N189 Chronic kidney disease, unspecified: Secondary | ICD-10-CM | POA: Diagnosis not present

## 2020-12-12 DIAGNOSIS — Z4789 Encounter for other orthopedic aftercare: Secondary | ICD-10-CM | POA: Diagnosis not present

## 2020-12-12 DIAGNOSIS — D631 Anemia in chronic kidney disease: Secondary | ICD-10-CM | POA: Diagnosis not present

## 2020-12-12 DIAGNOSIS — H35033 Hypertensive retinopathy, bilateral: Secondary | ICD-10-CM | POA: Diagnosis not present

## 2020-12-12 DIAGNOSIS — M48061 Spinal stenosis, lumbar region without neurogenic claudication: Secondary | ICD-10-CM | POA: Diagnosis not present

## 2020-12-12 NOTE — Progress Notes (Signed)
[facility] Note  12/13/2020     CHIEF COMPLAINT Patient presents for Retina Follow Up   HISTORY OF PRESENT ILLNESS: Justin [name] is a 87 y.o. male who presents to the clinic today for:   HPI    Retina Follow Up    Patient presents with  Diabetic Retinopathy.  In both eyes.  This started weeks ago.  Severity is moderate.  Duration of weeks.  Since onset it is stable.  I, the attending physician,  performed the HPI with the patient and updated documentation appropriately.          Comments    Pt states vision is the same OU.  Pt denies eye pain or discomfort.  Pt still has floaters OU--denies fol.       Last edited by [name], MD on 12/13/2020  3:39 PM. (History)    pt states vision is doing well, he is delayed to follow up from 5 weeks to 8 weeks due to having back sx on February 11  Referring physician: [name], MD [address] [place],  [place] [address]  HISTORICAL INFORMATION:   Selected notes from the MEDICAL RECORD NUMBER Referred by Dr. [name] for concern of mac edema OU   CURRENT MEDICATIONS: Current Outpatient Medications (Ophthalmic Drugs)  Medication Sig  . Polyvinyl Alcohol-Povidone (REFRESH OP) Place 1 drop into both eyes 3 (three) times daily as needed (dry eyes).   No current facility-administered medications for this visit. (Ophthalmic Drugs)   Current Outpatient Medications (Other)  Medication Sig  . ACCU-CHEK SMARTVIEW test strip   . Accu-Chek Softclix Lancets lancets   . acetaminophen (TYLENOL) 500 MG tablet Take 1,000 mg by mouth 2 (two) times daily as needed for mild pain.  Marland Kitchen AMITIZA 24 MCG capsule Take 24 mcg by mouth as needed for constipation.   Marland Kitchen amLODipine (NORVASC) 2.5 MG tablet Take 2 tablets (5 mg total) by mouth daily.  . Ascorbic Acid (VITAMIN C PO) Take 1 tablet by mouth daily.  Marland Kitchen aspirin EC 81 MG tablet Take 81 mg by mouth daily.  . Blood Glucose Monitoring Suppl (ACCU-CHEK  GUIDE) w/Device KIT   . chlorthalidone (HYGROTON) 50 MG tablet Take 25 mg by mouth daily.   . cholecalciferol (VITAMIN D3) 25 MCG (1000 UNIT) tablet Take 1,000 Units by mouth daily.  . clobetasol cream (TEMOVATE) 0.05 %   . clonazePAM (KLONOPIN) 0.5 MG tablet Take 0.5 mg by mouth daily as needed for anxiety.  . Cyanocobalamin (B-12 PO) Take 1 capsule by mouth daily.  Marland Kitchen ezetimibe (ZETIA) 10 MG tablet Take 1 tablet (10 mg total) by mouth daily after supper.  . gabapentin (NEURONTIN) 300 MG capsule Take 300 mg by mouth 2 (two) times daily.  Marland Kitchen HYDROcodone-acetaminophen (NORCO/VICODIN) 5-325 MG tablet Take 1 tablet by mouth every 4 (four) hours as needed for moderate pain.  . isosorbide mononitrate (IMDUR) 120 MG 24 hr tablet Take 1 tablet (120 mg total) by mouth daily.  Marland Kitchen JANUVIA 100 MG tablet Take 100 mg by mouth daily.   Marland Kitchen ketoconazole (NIZORAL) 2 % cream Apply 1 application topically 2 (two) times daily.  Marland Kitchen LANTUS SOLOSTAR 100 UNIT/ML Solostar Pen Inject 15-50 Units into the skin See admin instructions. Inject 50 units in the morning and 15 units in the evening  . methocarbamol (ROBAXIN) 500 MG tablet Take 1 tablet (500 mg total) by mouth 4 (four) times daily.  . metoprolol tartrate (LOPRESSOR) 25 MG  tablet Take 0.5 tablets (12.5 mg total) by mouth 2 (two) times daily.  Marland Kitchen omeprazole (PRILOSEC) 40 MG capsule Take 40 mg by mouth daily.   . Pyridoxine HCl (B-6 PO) Take 1 capsule by mouth daily.  . simvastatin (ZOCOR) 20 MG tablet Take 20 mg by mouth daily.  . tamsulosin (FLOMAX) 0.4 MG CAPS capsule Take 0.4 mg by mouth daily.  . vitamin B-12 (CYANOCOBALAMIN) 500 MCG tablet Take 500 mcg by mouth daily.  [name] 2.5 MG TABS tablet TAKE 1 TABLET TWICE DAILY (Patient taking differently: Take 2.5 mg by mouth 2 (two) times daily.)   No current facility-administered medications for this visit. (Other)      REVIEW OF SYSTEMS: ROS    Positive for: Gastrointestinal, Genitourinary, Endocrine,  Cardiovascular, Eyes   Negative for: Constitutional, Neurological, Skin, Musculoskeletal, HENT, Respiratory, Psychiatric, Allergic/Imm, Heme/Lymph   Last edited by Doneen Poisson on 12/13/2020  9:16 AM. (History)       ALLERGIES Allergies  Allergen Reactions  . Contrast Media [Iodinated Diagnostic Agents] Itching    PT STATES ALLERGY TO "CT DYE".  [name] [Red Dye] Swelling    CAT scan dye  . Ibuprofen Other (See Comments)    Upset stomach     PAST MEDICAL HISTORY Past Medical History:  Diagnosis Date  . Anxiety   . Arthritis   . Carotid arterial disease ([place])   . CKD (chronic kidney disease)   . Coronary artery disease   . Diabetes mellitus without complication ([place])   . Diabetic retinopathy ([place])    NPDR OU  . Diverticulitis   . Dyspnea   . GERD (gastroesophageal reflux disease)   . History of kidney stones 2021  . Hypercholesteremia   . Hypertension   . Hypertensive retinopathy    OU  . Myocardial infarction ([place]) 2009  . Pneumonia    as a child  . Prostate cancer ([place])   . UTI (lower urinary tract infection)    Past Surgical History:  Procedure Laterality Date  . ABDOMINAL SURGERY     for diverticulitis; also removed appendix  . APPENDECTOMY    . CARDIAC CATHETERIZATION  2018  . CATARACT EXTRACTION    . CATARACT EXTRACTION, BILATERAL    . CHOLECYSTECTOMY N/A 10/12/2017   Procedure: LAPAROSCOPIC CHOLECYSTECTOMY;  Surgeon: [name], MD;  Location: [place];  Service: General;  Laterality: N/A;  . CORONARY ARTERY BYPASS GRAFT  2009  . ERCP N/A 12/21/2018   Procedure: ENDOSCOPIC RETROGRADE CHOLANGIOPANCREATOGRAPHY (ERCP);  Surgeon: [name], MD;  Location: [place];  Service: Endoscopy;  Laterality: N/A;  . ESOPHAGOGASTRODUODENOSCOPY (EGD) WITH PROPOFOL N/A 12/17/2018   Procedure: ESOPHAGOGASTRODUODENOSCOPY (EGD) WITH PROPOFOL;  Surgeon: [name], MD;  Location: [place];  Service: Endoscopy;  Laterality: N/A;  . EUS Left  12/17/2018   Procedure: UPPER ENDOSCOPIC ULTRASOUND (EUS) LINEAR;  Surgeon: [name], MD;  Location: [place];  Service: Endoscopy;  Laterality: Left;  . EYE SURGERY     "for bleeding in eye"  . HERNIA REPAIR    . LEFT HEART CATH AND CORONARY ANGIOGRAPHY N/A 11/04/2016   Procedure: Left Heart Cath and Coronary Angiography;  Surgeon: [name], MD;  Location: [place] [facility];  Service: Cardiovascular;  Laterality: N/A;  . LOWER EXTREMITY ANGIOGRAPHY N/A 11/04/2016   Procedure: Lower Extremity Angiography;  Surgeon: [name], MD;  Location: [facility];  Service: Cardiovascular;  Laterality: N/A;  . LOWER EXTREMITY ANGIOGRAPHY N/A 01/03/2020   Procedure: LOWER  EXTREMITY ANGIOGRAPHY;  Surgeon: [name], MD;  Location: [facility];  Service: Cardiovascular;  Laterality: N/A;  . LUMBAR LAMINECTOMY/DECOMPRESSION MICRODISCECTOMY Bilateral 11/09/2020   Procedure: Laminectomy and Foraminotomy - bilateral - Lumbar Four-Five.;  Surgeon: Earnie Larsson, MD;  Location: [facility];  Service: Neurosurgery;  Laterality: Bilateral;  posterior  . PROSTATE SURGERY    . REMOVAL OF STONES  12/21/2018   Procedure: REMOVAL OF STONES;  Surgeon: [name], MD;  Location: [facility];  Service: Endoscopy;;  . [name]  12/21/2018   Procedure: [name];  Surgeon: [name], MD;  Location: [facility];  Service: Endoscopy;;    FAMILY HISTORY Family History  Problem Relation Age of Onset  . Diabetes Father   . Diabetes Maternal Aunt   . Diabetes Maternal Uncle   . Diabetes Maternal Grandmother   . Heart disease Sister   . Diabetes Brother   . Heart disease Sister     SOCIAL HISTORY Social History   Tobacco Use  . Smoking status: Former Smoker    Packs/day: 0.25    Years: 2.00    Pack years: 0.50    Types: Cigarettes  . Smokeless tobacco: Never Used  . Tobacco comment: when he was a teenage age [age]  Vaping Use  . Vaping Use: Never used  Substance Use Topics  .  Alcohol use: No  . Drug use: No         OPHTHALMIC EXAM:  Base Eye Exam    Visual Acuity (Snellen - Linear)      Right Left   Dist cc 20/30 -2 20/40 -2   Dist ph cc NI 20/30 -2   Correction: Glasses       Tonometry (Tonopen, 9:20 AM)      Right Left   Pressure 13 14       Pupils      Dark Light Shape React APD   Right 2 1 Round Minimal 0   Left 2 1 Round Minimal 0       Visual Fields      Left Right    Full Full       Extraocular Movement      Right Left    Full Full       Neuro/Psych    Oriented x3: Yes   Mood/Affect: Normal       Dilation    Both eyes: 1.0% Mydriacyl, 2.5% Phenylephrine @ 9:20 AM        Slit Lamp and Fundus Exam    Slit Lamp Exam      Right Left   Lids/Lashes Dermatochalasis - upper lid, Meibomian gland dysfunction Dermatochalasis - upper lid, Meibomian gland dysfunction   Conjunctiva/Sclera Melanosis Melanosis   Cornea Arcus, trace Punctate epithelial erosions, mild endopigment Arcus, 1-2+ Punctate epithelial erosions, trace endopigment, mild corneal haze   Anterior Chamber Deep and quiet Deep and quiet   Iris Mild Temporal Iris atrophy, Round and dilated, No NVI Mild Temporal Iris atrophy, Round and dilated, No NVI   Lens PC IOL in good position with mild aterior capsule fimosis PC IOL in good position with mild aterior capsule fimosis   Vitreous Vitreous syneresis, Posterior vitreous detachment, vitreous condensations Vitreous syneresis, Posterior vitreous detachment, vitreous condensations       Fundus Exam      Right Left   Disc pallor with sharp rim, Peripapillary atrophy, Compact mild pallor, sharp rim, Tilted disc, mild temporal Peripapillary atrophy, Compact   C/D Ratio 0.2 0.3  Macula Blunted foveal reflex, Retinal pigment epithelial mottling and clumping, Microaneurysms -- improved, persistent central cystic changes  Blunted foveal reflex, focal area of RPE atrophy SN to fovea, Microaneurysms, Retinal pigment epithelial  mottling, refractile Epiretinal membrane, central cystic changes - trace   Vessels Vascular attenuation, Tortuous, AV crossing changes, mild Copper wiring Tortuous, Vascular attenuation greater superiorly   Periphery Attached, rare, scattered MA, good peripheral PRP 360 - room for posterior fill in if needed Attached, rare, scattered MA, Peripheral PRP 360--room for posterior fill in if needed        Refraction    Wearing Rx      Sphere Cylinder Axis Add   Right +0.25 +0.50 010 +3.00   Left [place] +0.75 173 +3.00   Type: Bifocal          IMAGING AND PROCEDURES  Imaging and Procedures for _0 @  OCT, Retina - OU - Both Eyes       Right Eye Quality was good. Central Foveal Thickness: 362. Progression has been stable. Findings include abnormal foveal contour, intraretinal fluid, no SRF, retinal drusen , outer retinal atrophy (persistent IRF).   Left Eye Quality was good. Central Foveal Thickness: 247. Progression has been stable. Findings include abnormal foveal contour, no SRF, outer retinal atrophy, retinal drusen , no IRF (Trace Persistent cystic changes).   Notes *Images captured and stored on drive  Diagnosis / Impression:  DME OU, OD>OS OD: persistent IRF OS: tr persistent cystic changes  Clinical management:  See below  Abbreviations: NFP - Normal foveal profile. CME - cystoid macular edema. PED - pigment epithelial detachment. IRF - intraretinal fluid. SRF - subretinal fluid. EZ - ellipsoid zone. ERM - epiretinal membrane. ORA - outer retinal atrophy. ORT - outer retinal tubulation. SRHM - subretinal hyper-reflective material         Intravitreal Injection, Pharmacologic Agent - OD - Right Eye       Time Out 12/13/2020. 10:01 AM. Confirmed correct patient, procedure, site, and patient consented.   Anesthesia Topical anesthesia was used. Anesthetic medications included Lidocaine 2%, Proparacaine 0.5%.   Procedure Preparation included 5% betadine to ocular  surface, eyelid speculum. A (32g) needle was used.   Injection:  2 mg aflibercept Alfonse Flavors) SOLN   NDC: A3590391, Lot: [id number], Expiration date: 06/28/2021   Route: Intravitreal, Site: Right Eye, Waste: 0.05 mL  Post-op Post injection exam found visual acuity of at least counting fingers. The patient tolerated the procedure well. There were no complications. The patient received written and verbal post procedure care education. Post injection medications were not given.        Intravitreal Injection, Pharmacologic Agent - OS - Left Eye       Time Out 12/13/2020. 10:02 AM. Confirmed correct patient, procedure, site, and patient consented.   Anesthesia Topical anesthesia was used. Anesthetic medications included Lidocaine 2%, Proparacaine 0.5%.   Procedure Preparation included 5% betadine to ocular surface, eyelid speculum. A (32g) needle was used.   Injection:  2 mg aflibercept Alfonse Flavors) SOLN   NDC: M7179715, Lot: [id number], Expiration date: 07/28/2021   Route: Intravitreal, Site: Left Eye, Waste: 0.05 mL  Post-op Post injection exam found visual acuity of at least counting fingers. The patient tolerated the procedure well. There were no complications. The patient received written and verbal post procedure care education. Post injection medications were not given.                 ASSESSMENT/PLAN:    ICD-10-CM   1. Moderate  nonproliferative diabetic retinopathy of both eyes with macular edema associated with type 2 diabetes mellitus (HCC)  I29.7989 Intravitreal Injection, Pharmacologic Agent - OD - Right Eye    Intravitreal Injection, Pharmacologic Agent - OS - Left Eye    aflibercept (EYLEA) SOLN 2 mg    aflibercept (EYLEA) SOLN 2 mg  2. Retinal edema  H35.81 OCT, Retina - OU - Both Eyes  3. Essential hypertension  I10   4. Hypertensive retinopathy of both eyes  H35.033   5. Pseudophakia of both eyes  Z96.1     1,2. Moderate non-proliferative diabetic  retinopathy with edema OU   - moved to [place], [place] -- history of prior injections OS with Dr. [name]  - records received and reviewed from [facility] (Dr. [name])  - history of focal laser OS x2 and IVK/IVT OU in [place] -- last injection was IVK OS November 2013  - initial exam: scattered [place], DBH and +macular edema  - initial OCT: diabetic macular edema, both eyes, (OD > OS)  - FA (10.1.19) showed late leaking MA OU -- no NV  - delayed f/u on 4.16.20 due to hospitalization for sepsis / gallstones  - S/P PRP OD (07.14.20) - good laser surrounding, room for posterior fill in if needed  - S/P PRP OS (08.26.20)  - S/P IVA OD #1 (09.17.19), #2 (10.29.19), #3 (11.26.19), #4 (01.02.20), #5 (01.30.20), #6 (02.28.20), #7 (04.16.20), #8 (06.11.20), #9 (08.12.20), #10 (01.11.21)  - S/P IVA OS #1 (10.01.19), #2 (10.29.19), #3 (11.26.19), #4 (01.02.20), #5 (01.30.20), #6 (02.28.20), #7 (04.16.20), #8 (05.14.20), #9 (06.11.20), #10 (07.09.20), #11 (08.12.20), #12 (01.11.21)  - S/P IVK OD #1 (05.14.20) -- no significant improvement  - switched back to Avastin OD (#9 6.11.2020)  - repeated Eylea4U benefits investigation due to possible IVA resistance (8.12.20)  - S/P IVE OS #1 (09.16.20), #2 (10.16.20), #3 (11.13.20), #4 (12.11.20), #5 (02.08.21), #6 (03.12.21), #7 (04.16.21), #8 (05.14.21), #9 (06.18.21), #10 (07.23.21), #11 (8.30.21), #12 (10.05.21), #13 (11.10.21), #14 (12.15.21), #15 (1.18.22)  - S/P IVE OD #1 (09.16.20), #2 (10.16.20), #3 (11.13.20), #4 (12.11.20), #5 (02.08.21), #6 (03.12.21), #7 (04.16.21), #8 (05.14.21), #9 (06.18.21), #10 (07.23.21), #11 (08.30.21), #12 (10.05.21), #13 (11.10.21), #14 (12.15.21), #15 (01.18.22)  ** today, delayed f/u from 5 wks to 8 due to back surgery in Feb 2022 **  - OCT today shows OD: persistent IRF; OS: tr persistent cystic changes -- no significant worsening despite delayed f/u to 8 wks  - BCVA decreased to 20/30 from 20/25 OD: OS  improved to 20/30 from 20/50  - recommend IVE OU #16 today, 3.17.22 w/ f/u in 8 wks  - Eylea4U benefits investigation completed and verified for 2022 as of 01.18.22  - pt wishes to proceed  - RBA of procedure discussed, questions answered   - informed consent obtained  - see procedure note  - Eylea informed consent form signed and scanned on 12.11.2020  - f/u 8 weeks, DFE, OCT, possible injection  3,4. Hypertensive retinopathy OU  - discussed importance of tight BP control  - monitor  5. Pseudophakia OU  - s/p CE/IOL OU in [place], [place]  - beautiful surgeries, doing well  - monitor  Ophthalmic Meds Ordered this visit:  Meds ordered this encounter  Medications  . aflibercept (EYLEA) SOLN 2 mg  . aflibercept (EYLEA) SOLN 2 mg      Return in about 8 weeks (around 02/07/2021) for f/u NPDR OU, DFE, OCT.  There are no Patient Instructions  on file for this visit.  This document serves as a record of services personally performed by Gardiner Sleeper, MD, PhD. It was created on their behalf by Estill Bakes, COT an ophthalmic technician. The creation of this record is the provider's dictation and/or activities during the visit.    Electronically signed by: Estill Bakes, COT 3.16.22 @ 3:42 PM   This document serves as a record of services personally performed by Gardiner Sleeper, MD, PhD. It was created on their behalf by San Jetty. Owens Shark, OA an ophthalmic technician. The creation of this record is the provider's dictation and/or activities during the visit.    Electronically signed by: San Jetty. Marguerita Merles 03.17.2022 3:42 PM  Gardiner Sleeper, M.D., Ph.D. Diseases & Surgery of the Retina and [place] 12/13/2020   I have reviewed the above documentation for accuracy and completeness, and I agree with the above. Gardiner Sleeper, M.D., Ph.D. 12/13/20 3:42 PM   Abbreviations: M myopia (nearsighted); A astigmatism; H hyperopia (farsighted); P presbyopia;  Mrx spectacle prescription;  CTL contact lenses; OD right eye; OS left eye; OU both eyes  XT exotropia; ET esotropia; PEK punctate epithelial keratitis; PEE punctate epithelial erosions; DES dry eye syndrome; MGD meibomian gland dysfunction; ATs artificial tears; PFAT's preservative free artificial tears; [place] nuclear sclerotic cataract; PSC posterior subcapsular cataract; ERM epi-retinal membrane; PVD posterior vitreous detachment; RD retinal detachment; DM diabetes mellitus; DR diabetic retinopathy; NPDR non-proliferative diabetic retinopathy; PDR proliferative diabetic retinopathy; CSME clinically significant macular edema; DME diabetic macular edema; dbh dot blot hemorrhages; CWS cotton wool spot; POAG primary open angle glaucoma; C/D cup-to-disc ratio; HVF humphrey visual field; GVF goldmann visual field; OCT optical coherence tomography; IOP intraocular pressure; BRVO Branch retinal vein occlusion; CRVO central retinal vein occlusion; CRAO central retinal artery occlusion; BRAO branch retinal artery occlusion; RT retinal tear; SB scleral buckle; PPV pars plana vitrectomy; VH Vitreous hemorrhage; PRP panretinal laser photocoagulation; IVK intravitreal kenalog; VMT vitreomacular traction; MH Macular hole;  NVD neovascularization of the disc; NVE neovascularization elsewhere; AREDS age related eye disease study; ARMD age related macular degeneration; POAG primary open angle glaucoma; EBMD epithelial/anterior basement membrane dystrophy; ACIOL anterior chamber intraocular lens; IOL intraocular lens; PCIOL posterior chamber intraocular lens; Phaco/IOL phacoemulsification with intraocular lens placement; [place] photorefractive keratectomy; LASIK laser assisted in situ keratomileusis; HTN hypertension; DM diabetes mellitus; COPD chronic obstructive pulmonary disease

## 2020-12-13 ENCOUNTER — Ambulatory Visit (INDEPENDENT_AMBULATORY_CARE_PROVIDER_SITE_OTHER): Payer: Medicare HMO | Admitting: Ophthalmology

## 2020-12-13 ENCOUNTER — Other Ambulatory Visit: Payer: Self-pay

## 2020-12-13 ENCOUNTER — Encounter (INDEPENDENT_AMBULATORY_CARE_PROVIDER_SITE_OTHER): Payer: Self-pay | Admitting: Ophthalmology

## 2020-12-13 DIAGNOSIS — H35033 Hypertensive retinopathy, bilateral: Secondary | ICD-10-CM

## 2020-12-13 DIAGNOSIS — H3581 Retinal edema: Secondary | ICD-10-CM

## 2020-12-13 DIAGNOSIS — E113313 Type 2 diabetes mellitus with moderate nonproliferative diabetic retinopathy with macular edema, bilateral: Secondary | ICD-10-CM

## 2020-12-13 DIAGNOSIS — Z961 Presence of intraocular lens: Secondary | ICD-10-CM

## 2020-12-13 DIAGNOSIS — I10 Essential (primary) hypertension: Secondary | ICD-10-CM

## 2020-12-13 MED ORDER — AFLIBERCEPT 2MG/0.05ML IZ SOLN FOR KALEIDOSCOPE
2.0000 mg | INTRAVITREAL | Status: AC | PRN
  Administered 2020-12-13: 2 mg via INTRAVITREAL

## 2020-12-24 ENCOUNTER — Other Ambulatory Visit: Payer: Self-pay | Admitting: Student

## 2020-12-24 NOTE — Telephone Encounter (Signed)
Olmesartan d/c due to renal dysfunction. Pt currently on amlodipine

## 2020-12-28 IMAGING — NM NM GASTRIC EMPTYING
3 series · 3 of 3 positions shown · non-contrast
Comparison: None.

CLINICAL DATA: Pain with nausea and vomiting

EXAM:
NUCLEAR MEDICINE GASTRIC EMPTYING SCAN
TECHNIQUE: After oral ingestion of radiolabeled meal, sequential abdominal
images were obtained for 120 minutes. Residual percentage of
activity remaining within the stomach was calculated at 60 and 120
minutes.
RADIOPHARMACEUTICALS:  2.0 mCi Pc-33m sulfur colloid in standardized
meal including egg

[ge gastric emp lay · 2.26mm/px · 1 of 1 slices shown (1 of 3)]
[im 1/1]
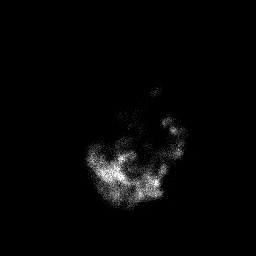

[ge gastric emp lay · 2.26mm/px · 1 of 1 slices shown (2 of 3)]
[im 1/1]
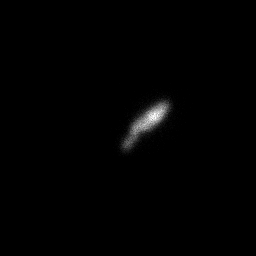

[ge gastric emp lay · 2.26mm/px · 1 of 1 slices shown (3 of 3)]
[im 1/1]
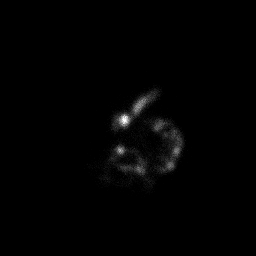

[3 of 3 positions shown; findings below may reference images not displayed]

FINDINGS: Expected location of the stomach in the left upper quadrant.
Ingested meal empties the stomach gradually over the course of the
study with 46% retention at 60 min and 5% retention at 120 min
(normal retention less than 30% at a 120 min).
IMPRESSION: Normal gastric emptying study.

## 2021-01-02 DIAGNOSIS — M79671 Pain in right foot: Secondary | ICD-10-CM | POA: Diagnosis not present

## 2021-01-02 DIAGNOSIS — E1151 Type 2 diabetes mellitus with diabetic peripheral angiopathy without gangrene: Secondary | ICD-10-CM | POA: Diagnosis not present

## 2021-01-02 DIAGNOSIS — M79672 Pain in left foot: Secondary | ICD-10-CM | POA: Diagnosis not present

## 2021-01-02 DIAGNOSIS — M2041 Other hammer toe(s) (acquired), right foot: Secondary | ICD-10-CM | POA: Diagnosis not present

## 2021-01-02 DIAGNOSIS — L602 Onychogryphosis: Secondary | ICD-10-CM | POA: Diagnosis not present

## 2021-01-02 DIAGNOSIS — M2042 Other hammer toe(s) (acquired), left foot: Secondary | ICD-10-CM | POA: Diagnosis not present

## 2021-01-02 DIAGNOSIS — L84 Corns and callosities: Secondary | ICD-10-CM | POA: Diagnosis not present

## 2021-01-08 ENCOUNTER — Ambulatory Visit: Payer: Medicare HMO | Admitting: Student

## 2021-01-08 NOTE — Progress Notes (Signed)
Primary Physician:  [name], MD   Patient ID: Justin [name], male    DOB: 09/06/1933, 87 y.o.   MRN: [id number]  Chief Complaint  Patient presents with  . Hypertension  . Coronary Artery Disease  . Follow-up    8 week  . Leg Swelling   HPI:    Justin [name]  is a 87 y.o. male  male with history of CAD, S/P CABG in 2009, PAD, type II diabetes with autonomic insufficiency and autonomic orthostatic hypotension, asymptomatic carotid stenosis, and hyperlipidemia.  He has patent graft except occluded SVG to RCA which is diffusely diseased small vessel and also severe below-knee bilateral lower disease by angiography on 11/04/2016 and repeat peripheral arteriogram on 01/03/20 and has severe slow flow suggestive of microvascular disease.   Patient presents for 8-week follow-up of hypertension.  At last visit increase amlodipine from 2.5 mg to 5 mg daily.  Patient underwent bilateral L4/5 laminectomy and foraminotomy on 11/09/2020.  He tolerated surgery well and has recovered well.  He does report bilateral lower leg swelling since surgery.  Patient denies chest pain, palpitations, syncope, near syncope.  He denies orthopnea, PND.  Patient does report some mild dyspnea on exertion since surgical intervention in February.  He also states he has been relatively inactive since surgery.  Patient's home blood pressure readings average 140/70 mmHg according to patient.  Patient does admit to some dietary noncompliance.  Review of previous hospital records revealed blood pressure was well controlled while in the hospital both prior to and following surgery, suspect high salt intake may be contributing to elevated blood pressure.   Past Medical History:  Diagnosis Date  . Anxiety   . Arthritis   . Carotid arterial disease ([place])   . CKD (chronic kidney disease)   . Coronary artery disease   . Diabetes mellitus without complication ([place])   . Diabetic retinopathy ([place])    NPDR OU  .  Diverticulitis   . Dyspnea   . GERD (gastroesophageal reflux disease)   . History of kidney stones 2021  . Hypercholesteremia   . Hypertension   . Hypertensive retinopathy    OU  . Myocardial infarction ([place]) 2009  . Pneumonia    as a child  . Prostate cancer ([place])   . UTI (lower urinary tract infection)     Past Surgical History:  Procedure Laterality Date  . ABDOMINAL SURGERY     for diverticulitis; also removed appendix  . APPENDECTOMY    . CARDIAC CATHETERIZATION  2018  . CATARACT EXTRACTION    . CATARACT EXTRACTION, BILATERAL    . CHOLECYSTECTOMY N/A 10/12/2017   Procedure: LAPAROSCOPIC CHOLECYSTECTOMY;  Surgeon: [name], MD;  Location: [facility];  Service: General;  Laterality: N/A;  . CORONARY ARTERY BYPASS GRAFT  2009  . ERCP N/A 12/21/2018   Procedure: ENDOSCOPIC RETROGRADE CHOLANGIOPANCREATOGRAPHY (ERCP);  Surgeon: [name], MD;  Location: [facility];  Service: Endoscopy;  Laterality: N/A;  . ESOPHAGOGASTRODUODENOSCOPY (EGD) WITH PROPOFOL N/A 12/17/2018   Procedure: ESOPHAGOGASTRODUODENOSCOPY (EGD) WITH PROPOFOL;  Surgeon: [name], MD;  Location: [facility];  Service: Endoscopy;  Laterality: N/A;  . EUS Left 12/17/2018   Procedure: UPPER ENDOSCOPIC ULTRASOUND (EUS) LINEAR;  Surgeon: [name], MD;  Location: [facility];  Service: Endoscopy;  Laterality: Left;  . EYE SURGERY     "for bleeding in eye"  . HERNIA REPAIR    . LEFT HEART CATH AND CORONARY ANGIOGRAPHY N/A 11/04/2016   Procedure: Left Heart Cath  and Coronary Angiography;  Surgeon: [name], MD;  Location: [facility];  Service: Cardiovascular;  Laterality: N/A;  . LOWER EXTREMITY ANGIOGRAPHY N/A 11/04/2016   Procedure: Lower Extremity Angiography;  Surgeon: [name], MD;  Location: [facility];  Service: Cardiovascular;  Laterality: N/A;  . LOWER EXTREMITY ANGIOGRAPHY N/A 01/03/2020   Procedure: LOWER EXTREMITY ANGIOGRAPHY;  Surgeon: [name], MD;  Location: [facility];  Service: Cardiovascular;  Laterality: N/A;  . LUMBAR LAMINECTOMY/DECOMPRESSION MICRODISCECTOMY Bilateral 11/09/2020   Procedure: Laminectomy and Foraminotomy - bilateral - Lumbar Four-Five.;  Surgeon: Earnie Larsson, MD;  Location: [facility];  Service: Neurosurgery;  Laterality: Bilateral;  posterior  . PROSTATE SURGERY    . REMOVAL OF STONES  12/21/2018   Procedure: REMOVAL OF STONES;  Surgeon: [name], MD;  Location: [facility];  Service: Endoscopy;;  . [name]  12/21/2018   Procedure: [name];  Surgeon: [name], MD;  Location: [facility] ENDOSCOPY;  Service: Endoscopy;;   Family History  Problem Relation Age of Onset  . Diabetes Father   . Diabetes Maternal Aunt   . Diabetes Maternal Uncle   . Diabetes Maternal Grandmother   . Heart disease Sister   . Diabetes Brother   . Heart disease Sister    Social History   Tobacco Use  . Smoking status: Former Smoker    Packs/day: 0.25    Years: 2.00    Pack years: 0.50    Types: Cigarettes  . Smokeless tobacco: Never Used  . Tobacco comment: when he was a teenage age [age]  Substance Use Topics  . Alcohol use: No    Marital Status: Widowed  ROS   Review of Systems  Constitutional: Negative for malaise/fatigue and weight gain.  Cardiovascular: Positive for dyspnea on exertion. Negative for chest pain, claudication, leg swelling, near-syncope, orthopnea, palpitations, paroxysmal nocturnal dyspnea and syncope.  Respiratory: Negative for shortness of breath.   Hematologic/Lymphatic: Does not bruise/bleed easily.  Musculoskeletal: Positive for back pain and muscle weakness.  Gastrointestinal: Negative for melena.  Neurological: Negative for dizziness and weakness.  All other systems reviewed and are negative.  Objective  Blood pressure (!) 142/64, pulse (!) 55, temperature 97.9 F (36.6 C), height _0  (1.702 m), weight 191 lb (86.6 kg), SpO2 100 %. Body mass index is 29.91 kg/m.  Vitals with BMI 01/09/2021  11/13/2020 11/10/2020  Height _1  _2  -  Weight 191 lbs 185 lbs -  BMI 32.99 24.26 -  Systolic 834 196 222  Diastolic 64 72 55  Pulse 55 75 106   Physical Exam Vitals reviewed.  Constitutional:      Appearance: He is well-developed.  HENT:     Head: Normocephalic and atraumatic.  Cardiovascular:     Rate and Rhythm: Normal rate and regular rhythm.     Pulses: Intact distal pulses.          Carotid pulses are on the right side with bruit and on the left side with bruit.      Femoral pulses are 2+ on the right side and 2+ on the left side.      Popliteal pulses are 1+ on the right side and 1+ on the left side.       Dorsalis pedis pulses are 0 on the right side and 0 on the left side.       Posterior tibial pulses are 0 on the right side and 0 on the left side.     Heart  sounds: Normal heart sounds, S1 normal and S2 normal. No murmur heard. No gallop.      Comments: Split S2. NO JVD. Pulmonary:     Effort: Pulmonary effort is normal. No accessory muscle usage or respiratory distress.     Breath sounds: Normal breath sounds. No wheezing, rhonchi or rales.  Abdominal:     General: Bowel sounds are normal.     Palpations: Abdomen is soft.     Tenderness: There is no abdominal tenderness.  Musculoskeletal:     Right lower leg: No edema.     Left lower leg: No edema.  Skin:    General: Skin is warm and dry.     Capillary Refill: Capillary refill takes 2 to 3 seconds.  Neurological:     Mental Status: He is alert.    Laboratory examination:    CMP Latest Ref Rng & Units 11/06/2020 10/09/2020 01/03/2020  Glucose 70 - 99 mg/dL 134(H) 203(H) 124(H)  BUN 8 - 23 mg/dL 30(H) 24 36(H)  Creatinine 0.61 - 1.24 mg/dL 1.64(H) 1.66(H) 1.50(H)  Sodium 135 - 145 mmol/L 139 141 143  Potassium 3.5 - 5.1 mmol/L 4.6 4.6 5.1  Chloride 98 - 111 mmol/L 112(H) 109(H) 110  CO2 22 - 32 mmol/L 19(L) 17(L) -  Calcium 8.9 - 10.3 mg/dL 9.2 9.3 -  Total Protein 6.0 - 8.5 g/dL - - -  Total Bilirubin  0.0 - 1.2 mg/dL - - -  Alkaline Phos 39 - 117 IU/L - - -  AST 0 - 40 IU/L - - -  ALT 0 - 44 IU/L - - -   CBC Latest Ref Rng & Units 11/06/2020 01/03/2020 12/19/2019  WBC 4.0 - 10.5 K/uL 7.6 - 9.1  Hemoglobin 13.0 - 17.0 g/dL 13.4 12.9(L) 13.2  Hematocrit 39.0 - 52.0 % 43.3 38.0(L) 41.8  Platelets 150 - 400 K/uL 184 - 203   Lipid Panel     Component Value Date/Time   CHOL 162 03/02/2020 0819   TRIG 66 03/02/2020 0819   HDL 63 03/02/2020 0819   LDLCALC 86 03/02/2020 0819   HEMOGLOBIN A1C Lab Results  Component Value Date   HGBA1C 7.2 (H) 11/06/2020   MPG 159.94 11/06/2020   TSH No results for input(s): TSH in the last 8760 hours.   External labs:  Cholesterol, total 155.000 M 03/14/2019 HDL 61.000 M 03/14/2019 LDL 105 NHDL 94 Triglycerides 67.000 M 03/14/2019   A1C 7.100 % 06/08/2019; TSH 1.530 03/14/2019  Hemoglobin 12.600 G/ 07/04/2019; INR 1.300 12/14/2018 Platelets 198.000 X 07/04/2019  Creatinine, Serum 1.700 MG/ 07/04/2019 Potassium 4.100 03/21/2019 Magnesium 2.000 MG/ 09/17/2016 ALT (SGPT) 16.000 03/21/2019  BNP 46.800 PG 10/13/2016  Medication and allergies    Allergies  Allergen Reactions  . Contrast Media [Iodinated Diagnostic Agents] Itching    PT STATES ALLERGY TO "CT DYE".  . Amlodipine Swelling    Leg swelling  . Dye Fdc Red [Red Dye] Swelling    CAT scan dye  . Ibuprofen Other (See Comments)    Upset stomach   Needing hospital admission for 24 hours and eventually discharged home the following day. Current Outpatient Medications  Medication Instructions  . ACCU-CHEK SMARTVIEW test strip No dose, route, or frequency recorded.  . Accu-Chek Softclix Lancets lancets No dose, route, or frequency recorded.  Marland Kitchen acetaminophen (TYLENOL) 1,000 mg, Oral, 2 times daily PRN  . Amitiza 24 mcg, Oral, As needed  . Ascorbic Acid (VITAMIN C PO) 1 tablet, Oral, Daily  . aspirin EC 81  mg, Oral, Daily  . Blood Glucose Monitoring Suppl (ACCU-CHEK GUIDE) w/Device KIT No dose,  route, or frequency recorded.  . chlorthalidone (HYGROTON) 25 mg, Oral, 2 times daily  . cholecalciferol (VITAMIN D3) 1,000 Units, Oral, Daily  . clobetasol cream (TEMOVATE) 0.05 % No dose, route, or frequency recorded.  . clonazePAM (KLONOPIN) 0.5 mg, Oral, Daily PRN  . Cyanocobalamin (B-12 PO) 1 capsule, Oral, Daily  . ezetimibe (ZETIA) 10 mg, Oral, Daily after supper  . gabapentin (NEURONTIN) 300 mg, Oral, 2 times daily  . hydrALAZINE (APRESOLINE) 25 mg, Oral, 3 times daily  . HYDROcodone-acetaminophen (NORCO/VICODIN) 5-325 MG tablet 1 tablet, Oral, Every 4 hours PRN  . isosorbide mononitrate (IMDUR) 120 mg, Oral, Daily  . Januvia 100 mg, Oral, Daily  . ketoconazole (NIZORAL) 2 % cream 1 application, Topical, 2 times daily  . Lantus SoloStar 15-50 Units, Subcutaneous, See admin instructions, Inject 50 units in the morning and 15 units in the evening  . methocarbamol (ROBAXIN) 500 mg, Oral, 4 times daily  . metoprolol tartrate (LOPRESSOR) 12.5 mg, Oral, 2 times daily  . omeprazole (PRILOSEC) 40 mg, Oral, Daily  . Polyvinyl Alcohol-Povidone (REFRESH OP) 1 drop, Both Eyes, 3 times daily PRN  . Pyridoxine HCl (B-6 PO) 1 capsule, Oral, Daily  . simvastatin (ZOCOR) 20 mg, Oral, Daily  . tamsulosin (FLOMAX) 0.4 mg, Oral, Daily  . vitamin B-12 (CYANOCOBALAMIN) 500 mcg, Oral, Daily  . XARELTO 2.5 MG TABS tablet TAKE 1 TABLET TWICE DAILY   Radiology:   No results found.   Cardiac Studies:   Lexiscan myoview stress test 04/17/2016: 1. Resting EKG demonstrates normal sinus rhythm, left axis deviation, right bundle branch block. Stress EKG is nondiagnostic for ischemia as a pharmacologic stress test. There are frequent PACs during the stress test. Stress symptoms included dyspnea. 2. The Perfusion images reveal the left ventricle to be mildly dilated at 132 mL both in rest and stress images. There is a moderate-sized inferior and inferoapical scar with very mild peri-infarct ischemia noted  especially towards the apex. Left ventricle systolic function was moderate to severely depressed at 36%. 3. This is an intermediate risk study, clinical correlation recommended.    Coronary angiogram 11/04/2016: Native RCA small and diffusely diseased with distal 70-80% stenosis. Occluded SVG to RCA. Patent SVG to OM-RI, LIMA to LAD. Medical therapy.  Echo- 12/24/2017 1. Left ventricle cavity is normal in size. Moderate concentric hypertrophy of the left ventricle. Normal global echocardiogram wall motion. Doppler evidence of grade I (impaired) diastolic dysfunction. Calculated EF 54%. 2. Left atrial cavity is mildly dilated. 3. Trileaflet aortic valve. Mild aortic valve leaflet calcification. 4. Mild to moderate mitral regurgitation. Mild calcification function of the mitral valve annulus. Mild mitral valve leaflet patient later uptitrated to calcification. 5. Trace tricuspid regurgitation.   ABI 05/02/2019: No hemodynamically significant stenoses are identified in the bilateral lower extremity arterial system.   Non-compressible ABI bilateral, suggestive of medial calcinosis. Moderately abnormal waveform right and mildly abnormal waveform left lower extremity at the level of the ankle. Study suggests small vessel disease.  Carotid artery duplex 10/11/2019:  Stenosis in the right internal carotid artery (50-69%). The right PSV  internal/common carotid artery ratio of 4.88 is consistent with a stenosis  of >70%. Stenosis in the right external carotid artery (<50%).  Stenosis in the left internal carotid artery (50-69%).  Stenosis in the left external carotid artery (<50%).  Peak velocity in the right ICA is 152/24 cm/s and left 203/27 cm/s. Antegrade right  vertebral artery flow. Antegrade left vertebral artery  flow.  No significant change since 05/18/2019. Follow up in six months is  appropriate if clinically indicated.  Lower Extremity Arterial Duplex 11/30/2019:  No hemodynamically  significant stenosis in bilateral lower extremity above  the knee.  Moderate velocity increase at the right mid posterior tibial artery.  suggests >50%stenosis.  Diffuse small vessel disease with moderately abnormal waveform right ankle  and severely abnormal waveform left ankle.  Non compressible vessels bilateral at the ankles suggests medial  calcinosis.   No significant change from 05/02/2019.  Peripheral arteriogram 01/03/2020: Distal abdominal aortogram with bifemoral arteriogram reveals widely patent iliac vessels and common femoral vessels.  Left SFA and left popliteal artery are widely patent with diffuse arterial sclerosis.  No luminal irregularity.  Below the left knee, two-vessel runoff in the form of PT and peroneal artery.  There is diffuse disease noted in both the peroneal and PT.  AT is occluded. Similarly on the right no significant disease in the SFA and there is probably a two-vessel runoff in the form of peroneal artery and PT.  AT is occluded. Recommendation: Patient has very slow flow suggestive of microvascular disease and also small vessel disease.  Unless critical limb ischemia no reason for angioplasty, continue medical therapy.  50 mill contrast utilized.  Carotid artery duplex 04/06/2020:  Stenosis in the right internal carotid artery (50-69%). The right PSV  internal/common carotid artery ratio of 4.56 is consistent with a stenosis  of >70%. Stenosis in the right external carotid artery (>50%).  Stenosis in the left common carotid artery (<50%). Stenosis in the left  external carotid artery (<50%).  Antegrade right vertebral artery flow. Antegrade left vertebral artery  flow.  Follow up in six months is appropriate if clinically indicated. Compared  to 10/11/2019, no change in right ICA stenosis. Left ICSA stenosis was  50-69%  PCV MYOCARDIAL PERFUSION WITH LEXISCAN 09/24/2020 Lexiscan nuclear stress test performed using 1-day protocol. SPECT images showed  small sized, mild intensity, mildly reversible perfusion defect in apical to basal inferior myocardium. Stress LVEF 53%. Low risk study.  Carotid artery duplex 11/07/2020:  Stenosis in the right internal carotid artery (50-69%). The right PSV  internal/common carotid artery ratio of 5.65 is consistent with a stenosis  of >70%. Stenosis in the right external carotid artery (>50%).  Left internal carotid artery are consistent with stenosis in the range of  16-49%. Stenosis in the left common carotid artery (<50%). Stenosis in the  left external carotid artery (<50%).  There is heterogeneous plaque noted bilaterally.  Antegrade right vertebral artery flow. Antegrade left vertebral artery  flow.  Follow up in six months is appropriate if clinically indicated. Compared  to 04/06/2020, no significant change.   EKG   EKG 10/02/2020: Sinus bradycardia at a rate of 54 bpm with borderline first-degree AV block. Left axis deviation, left anterior fascicular block.  Right bundle branch block.  Nonspecific T wave abnormality.  Compared to EKG 11/14/2019, bradycardia new.  EKG 11/14/2019: Normal sinus rhythm at 74 bpm with 1 PAC, left axis deviation, left anterior fasicular block, RBBB. Nonspecific T wave abnormality.  Assessment:     ICD-10-CM   1. Primary hypertension  I10 PCV ECHOCARDIOGRAM COMPLETE  2. Leg swelling  M79.89 PCV ECHOCARDIOGRAM COMPLETE  3. Dyspnea on exertion  R06.00   4. Carotid stenosis, asymptomatic, right  I65.21     Meds ordered this encounter  Medications  . hydrALAZINE (APRESOLINE) 25 MG tablet  Sig: Take 1 tablet (25 mg total) by mouth 3 (three) times daily.    Dispense:  270 tablet    Refill:  3   Medications Discontinued During This Encounter  Medication Reason  . Cholecalciferol 50 MCG (2000 UT) CAPS Error  . amLODipine (NORVASC) 2.5 MG tablet Side effect (s)    Recommendations:   Monroe Qin  is a 87 y.o. male with history of CAD, S/P CABG in 2009, PAD,  type II diabetes with autonomic insufficiency and autonomic orthostatic hypotension, asymptomatic carotid stenosis, and hyperlipidemia.  He has patent graft except occluded SVG to RCA which is diffusely diseased small vessel and also severe below-knee bilateral lower disease by angiography on 11/04/2016 and repeat peripheral arteriogram on 01/03/20 and has severe slow flow suggestive of microvascular disease.    Patient presents for 8-week follow-up of hypertension.  At last visit increase amlodipine from 2.5 mg to 5 mg daily.  Since last visit patient has developed bilateral lower leg edema which may be related to increased amlodipine dosing.  We will therefore discontinue amlodipine and start patient on hydralazine25 mg 3 times daily.  As patient reports dyspnea on exertion will obtain echocardiogram at this time.  However in view of recent surgery suspect dyspnea may be related to deconditioning.  I have encouraged patient to continue to slowly increase his physical activity as tolerated.  Reviewed and discussed with patient results of recent carotid artery duplex, which are stable compared to previous.  We will plan for repeat carotid artery surveillance in 6 months.  Patient will continue to monitor his blood pressure daily at home and notify our office if it remains >140/90 mmHg.  Again discussed with patient regarding importance of Dash diet compliance.  Follow-up in 3 months, sooner if needed, for hypertension, CAD, and carotid artery stenosis.   Alethia Berthold, PA-C 01/11/2021, 12:47 PM Office: [phone]

## 2021-01-09 ENCOUNTER — Telehealth: Payer: Self-pay

## 2021-01-09 ENCOUNTER — Other Ambulatory Visit: Payer: Self-pay

## 2021-01-09 ENCOUNTER — Encounter: Payer: Self-pay | Admitting: Student

## 2021-01-09 ENCOUNTER — Ambulatory Visit: Payer: Medicare HMO | Admitting: Student

## 2021-01-09 VITALS — BP 142/64 | HR 55 | Temp 97.9°F | Ht 67.0 in | Wt 191.0 lb

## 2021-01-09 DIAGNOSIS — I6521 Occlusion and stenosis of right carotid artery: Secondary | ICD-10-CM | POA: Diagnosis not present

## 2021-01-09 DIAGNOSIS — R0609 Other forms of dyspnea: Secondary | ICD-10-CM

## 2021-01-09 DIAGNOSIS — I10 Essential (primary) hypertension: Secondary | ICD-10-CM

## 2021-01-09 DIAGNOSIS — M7989 Other specified soft tissue disorders: Secondary | ICD-10-CM

## 2021-01-09 DIAGNOSIS — R0600 Dyspnea, unspecified: Secondary | ICD-10-CM

## 2021-01-09 MED ORDER — HYDRALAZINE HCL 25 MG PO TABS: 25.0000 mg | ORAL_TABLET | Freq: Three times a day (TID) | ORAL | 3 refills | Status: DC

## 2021-01-09 NOTE — Telephone Encounter (Signed)
Pt called to give you his meds.   chlorthalidone 50 mg, 1/2 tab bid isorbide mono 120 mg,, 1 tab daily Metoprolol tartrate 50 mg, 1/2 tab bid

## 2021-01-09 NOTE — Telephone Encounter (Signed)
Please just update his med list accordingly

## 2021-01-12 ENCOUNTER — Other Ambulatory Visit: Payer: Self-pay | Admitting: Cardiology

## 2021-01-12 DIAGNOSIS — I739 Peripheral vascular disease, unspecified: Secondary | ICD-10-CM

## 2021-01-12 DIAGNOSIS — I2510 Atherosclerotic heart disease of native coronary artery without angina pectoris: Secondary | ICD-10-CM

## 2021-01-18 DIAGNOSIS — E119 Type 2 diabetes mellitus without complications: Secondary | ICD-10-CM | POA: Diagnosis not present

## 2021-01-18 DIAGNOSIS — Z0100 Encounter for examination of eyes and vision without abnormal findings: Secondary | ICD-10-CM | POA: Diagnosis not present

## 2021-01-21 ENCOUNTER — Ambulatory Visit: Payer: Medicare HMO

## 2021-01-21 ENCOUNTER — Other Ambulatory Visit: Payer: Self-pay

## 2021-01-21 DIAGNOSIS — I10 Essential (primary) hypertension: Secondary | ICD-10-CM

## 2021-01-21 DIAGNOSIS — M7989 Other specified soft tissue disorders: Secondary | ICD-10-CM | POA: Diagnosis not present

## 2021-01-23 ENCOUNTER — Telehealth: Payer: Self-pay

## 2021-01-23 NOTE — Telephone Encounter (Signed)
Patient called give you his BP readings from this morning. Patient is concerned that these BP readings are to high and wants to know if you want to see him. Please advise.   180/76  170/77  160/75

## 2021-01-23 NOTE — Telephone Encounter (Signed)
If these readings are all from this morning I would not make changes based on one day. Please ask him for daily readings over the last 1 week.

## 2021-01-25 NOTE — Telephone Encounter (Signed)
Called patient, he will document BP for a week and then call back to give [place] the readings.

## 2021-02-01 ENCOUNTER — Telehealth: Payer: Self-pay

## 2021-02-01 NOTE — Telephone Encounter (Signed)
He is on 25 do you want me to just tell him to double up or send a new script please advise

## 2021-02-01 NOTE — Telephone Encounter (Signed)
Patient called back to give his bp reading pt gave the morning and afternoon   04/29/222: 186/89 no hr am  ,  177/75 no hr pm  01/26/21: 176/76  am  , 164/75 w/ hr 66 pm  01/27/21: 167/87 w/ hr 60 am  , 118/67 w/ hr 60 pm 01/28/21: 166/80 w/ hr 61 am , 159/77 w/ hr 55 pm 01/29/21: 163/77 w/ hr 71 am , 157/76 pm 01/30/21: 159/76 w/ hr 63  am ,  157/81 w/ hr 57 pm 01/31/21: 166/83 w/ hr 59 am , 164/77 w/hr of 61

## 2021-02-01 NOTE — Telephone Encounter (Signed)
Please advise patient to increase hydralazine to 50 mg TID

## 2021-02-01 NOTE — Telephone Encounter (Signed)
Just double up

## 2021-02-01 NOTE — Telephone Encounter (Signed)
Patient is aware 

## 2021-02-04 NOTE — Progress Notes (Signed)
[facility] Note  02/07/2021     CHIEF COMPLAINT Patient presents for Retina Follow Up   HISTORY OF PRESENT ILLNESS: Justin [name] is a 87 y.o. male who presents to the clinic today for:   HPI    Retina Follow Up    Patient presents with  Diabetic Retinopathy.  In both eyes.  This started 8 weeks ago.  Since onset it is stable.  I, the attending physician,  performed the HPI with the patient and updated documentation appropriately.          Comments    Pt here for 8 week exu retinal follow up NPDR OU. Pt states vision is the same, no changes. No ocular pain or discomfort. Blood sugar was 117 this AM, recent A1C was 8. Pt did get new glasses w/ updated rx.        Last edited by [name], MD on 02/07/2021 11:31 AM. (History)    pt states vision is doing okay, he got new glasses from [employer], he is having problems with neuropathy in both legs and feet  Referring physician: [name], MD [address] [place],  [place] [address]  HISTORICAL INFORMATION:   Selected notes from the [facility] Referred by Dr. [name] for concern of mac edema OU   CURRENT MEDICATIONS: Current Outpatient Medications (Ophthalmic Drugs)  Medication Sig  . Polyvinyl Alcohol-Povidone (REFRESH OP) Place 1 drop into both eyes 3 (three) times daily as needed (dry eyes).   No current facility-administered medications for this visit. (Ophthalmic Drugs)   Current Outpatient Medications (Other)  Medication Sig  . ACCU-CHEK SMARTVIEW test strip   . Accu-Chek Softclix Lancets lancets   . acetaminophen (TYLENOL) 500 MG tablet Take 1,000 mg by mouth 2 (two) times daily as needed for mild pain.  Marland Kitchen AMITIZA 24 MCG capsule Take 24 mcg by mouth as needed for constipation.   . Ascorbic Acid (VITAMIN C PO) Take 1 tablet by mouth daily.  Marland Kitchen aspirin EC 81 MG tablet Take 81 mg by mouth daily.  . Blood Glucose Monitoring Suppl (ACCU-CHEK GUIDE)  w/Device KIT   . chlorthalidone (HYGROTON) 50 MG tablet Take 25 mg by mouth in the morning and at bedtime.  . cholecalciferol (VITAMIN D3) 25 MCG (1000 UNIT) tablet Take 1,000 Units by mouth daily.  . clobetasol cream (TEMOVATE) 0.05 %   . clonazePAM (KLONOPIN) 0.5 MG tablet Take 0.5 mg by mouth daily as needed for anxiety.  . Cyanocobalamin (B-12 PO) Take 1 capsule by mouth daily.  Marland Kitchen ezetimibe (ZETIA) 10 MG tablet Take 1 tablet (10 mg total) by mouth daily after supper.  . gabapentin (NEURONTIN) 300 MG capsule Take 300 mg by mouth 2 (two) times daily.  . hydrALAZINE (APRESOLINE) 25 MG tablet Take 1 tablet (25 mg total) by mouth 3 (three) times daily.  Marland Kitchen HYDROcodone-acetaminophen (NORCO/VICODIN) 5-325 MG tablet Take 1 tablet by mouth every 4 (four) hours as needed for moderate pain.  . isosorbide mononitrate (IMDUR) 120 MG 24 hr tablet TAKE 1 TABLET EVERY DAY  . JANUVIA 100 MG tablet Take 100 mg by mouth daily.   Marland Kitchen ketoconazole (NIZORAL) 2 % cream Apply 1 application topically 2 (two) times daily.  Marland Kitchen LANTUS SOLOSTAR 100 UNIT/ML Solostar Pen Inject 15-50 Units into the skin See admin instructions. Inject 50 units in the morning and 15 units in the evening  . methocarbamol (ROBAXIN) 500 MG tablet Take 1 tablet (500 mg  total) by mouth 4 (four) times daily.  . metoprolol tartrate (LOPRESSOR) 25 MG tablet Take 0.5 tablets (12.5 mg total) by mouth 2 (two) times daily.  Marland Kitchen omeprazole (PRILOSEC) 40 MG capsule Take 40 mg by mouth daily.   . Pyridoxine HCl (B-6 PO) Take 1 capsule by mouth daily.  . simvastatin (ZOCOR) 20 MG tablet Take 20 mg by mouth daily.  . tamsulosin (FLOMAX) 0.4 MG CAPS capsule Take 0.4 mg by mouth daily.  . vitamin B-12 (CYANOCOBALAMIN) 500 MCG tablet Take 500 mcg by mouth daily.  [name] 2.5 MG TABS tablet TAKE 1 TABLET TWICE DAILY (Patient taking differently: Take 2.5 mg by mouth 2 (two) times daily.)   No current facility-administered medications for this visit. (Other)       REVIEW OF SYSTEMS: ROS    Positive for: Gastrointestinal, Genitourinary, Endocrine, Cardiovascular, Eyes   Negative for: Constitutional, Neurological, Skin, Musculoskeletal, HENT, Respiratory, Psychiatric, Allergic/Imm, Heme/Lymph   Last edited by Kingsley Spittle, COT on 02/07/2021  9:15 AM. (History)       ALLERGIES Allergies  Allergen Reactions  . Contrast Media [Iodinated Diagnostic Agents] Itching    PT STATES ALLERGY TO "CT DYE".  . Amlodipine Swelling    Leg swelling  . Dye Fdc Red [Red Dye] Swelling    CAT scan dye  . Ibuprofen Other (See Comments)    Upset stomach     PAST MEDICAL HISTORY Past Medical History:  Diagnosis Date  . Anxiety   . Arthritis   . Carotid arterial disease ([place])   . CKD (chronic kidney disease)   . Coronary artery disease   . Diabetes mellitus without complication ([place])   . Diabetic retinopathy ([place])    NPDR OU  . Diverticulitis   . Dyspnea   . GERD (gastroesophageal reflux disease)   . History of kidney stones 2021  . Hypercholesteremia   . Hypertension   . Hypertensive retinopathy    OU  . Myocardial infarction ([place]) 2009  . Pneumonia    as a child  . Prostate cancer ([place])   . UTI (lower urinary tract infection)    Past Surgical History:  Procedure Laterality Date  . ABDOMINAL SURGERY     for diverticulitis; also removed appendix  . APPENDECTOMY    . CARDIAC CATHETERIZATION  2018  . CATARACT EXTRACTION    . CATARACT EXTRACTION, BILATERAL    . CHOLECYSTECTOMY N/A 10/12/2017   Procedure: LAPAROSCOPIC CHOLECYSTECTOMY;  Surgeon: [name], MD;  Location: [facility];  Service: General;  Laterality: N/A;  . CORONARY ARTERY BYPASS GRAFT  2009  . ERCP N/A 12/21/2018   Procedure: ENDOSCOPIC RETROGRADE CHOLANGIOPANCREATOGRAPHY (ERCP);  Surgeon: [name], MD;  Location: [facility];  Service: Endoscopy;  Laterality: N/A;  . ESOPHAGOGASTRODUODENOSCOPY (EGD) WITH PROPOFOL N/A 12/17/2018   Procedure:  ESOPHAGOGASTRODUODENOSCOPY (EGD) WITH PROPOFOL;  Surgeon: [name], MD;  Location: [facility];  Service: Endoscopy;  Laterality: N/A;  . EUS Left 12/17/2018   Procedure: UPPER ENDOSCOPIC ULTRASOUND (EUS) LINEAR;  Surgeon: [name], MD;  Location: [facility];  Service: Endoscopy;  Laterality: Left;  . EYE SURGERY     "for bleeding in eye"  . HERNIA REPAIR    . LEFT HEART CATH AND CORONARY ANGIOGRAPHY N/A 11/04/2016   Procedure: Left Heart Cath and Coronary Angiography;  Surgeon: [name], MD;  Location: [facility];  Service: Cardiovascular;  Laterality: N/A;  . LOWER EXTREMITY ANGIOGRAPHY N/A 11/04/2016   Procedure: Lower Extremity Angiography;  Surgeon: [name],  MD;  Location: [facility];  Service: Cardiovascular;  Laterality: N/A;  . LOWER EXTREMITY ANGIOGRAPHY N/A 01/03/2020   Procedure: LOWER EXTREMITY ANGIOGRAPHY;  Surgeon: [name], MD;  Location: [facility];  Service: Cardiovascular;  Laterality: N/A;  . LUMBAR LAMINECTOMY/DECOMPRESSION MICRODISCECTOMY Bilateral 11/09/2020   Procedure: Laminectomy and Foraminotomy - bilateral - Lumbar Four-Five.;  Surgeon: Earnie Larsson, MD;  Location: [facility];  Service: Neurosurgery;  Laterality: Bilateral;  posterior  . PROSTATE SURGERY    . REMOVAL OF STONES  12/21/2018   Procedure: REMOVAL OF STONES;  Surgeon: [name], MD;  Location: [facility];  Service: Endoscopy;;  . [name]  12/21/2018   Procedure: [name];  Surgeon: [name], MD;  Location: [facility] ENDOSCOPY;  Service: Endoscopy;;    FAMILY HISTORY Family History  Problem Relation Age of Onset  . Diabetes Father   . Diabetes Maternal Aunt   . Diabetes Maternal Uncle   . Diabetes Maternal Grandmother   . Heart disease Sister   . Diabetes Brother   . Heart disease Sister     SOCIAL HISTORY Social History   Tobacco Use  . Smoking status: Former Smoker    Packs/day: 0.25    Years: 2.00    Pack years: 0.50    Types: Cigarettes  .  Smokeless tobacco: Never Used  . Tobacco comment: when he was a teenage age 10-19  Vaping Use  . Vaping Use: Never used  Substance Use Topics  . Alcohol use: No  . Drug use: No         OPHTHALMIC EXAM:  Base Eye Exam    Visual Acuity (Snellen - Linear)      Right Left   Dist cc 20/30 -1 20/30 -2   Dist ph cc NI NI   Correction: Glasses       Tonometry (Tonopen, 9:28 AM)      Right Left   Pressure 16 17       Pupils      Dark Light Shape React APD   Right 2 1 Round Minimal None   Left 2 1 Round Minimal None       Visual Fields (Counting fingers)      Left Right    Full Full       Extraocular Movement      Right Left    Full, Ortho Full, Ortho       Neuro/Psych    Oriented x3: Yes   Mood/Affect: Normal       Dilation    Both eyes: 1.0% Mydriacyl, 2.5% Phenylephrine @ 9:29 AM        Slit Lamp and Fundus Exam    Slit Lamp Exam      Right Left   Lids/Lashes Dermatochalasis - upper lid, Meibomian gland dysfunction Dermatochalasis - upper lid, Meibomian gland dysfunction   Conjunctiva/Sclera Melanosis Melanosis   Cornea Arcus, trace Punctate epithelial erosions, mild endopigment Arcus, 1-2+ Punctate epithelial erosions, trace endopigment, mild corneal haze   Anterior Chamber Deep and quiet Deep and quiet   Iris Mild Temporal Iris atrophy, Round and dilated, No NVI Mild Temporal Iris atrophy, Round and dilated, No NVI   Lens PC IOL in good position with mild aterior capsule fimosis PC IOL in good position with mild aterior capsule fimosis   Vitreous Vitreous syneresis, Posterior vitreous detachment, vitreous condensations Vitreous syneresis, Posterior vitreous detachment, vitreous condensations       Fundus Exam      Right Left  Disc pallor with sharp rim, Peripapillary atrophy, Compact mild pallor, sharp rim, Tilted disc, mild temporal Peripapillary atrophy, Compact   C/D Ratio 0.2 0.3   Macula Blunted foveal reflex, Retinal pigment epithelial mottling  and clumping, Microaneurysms -- improved, persistent central cystic changes -- lsightly improved Blunted foveal reflex, focal area of RPE atrophy SN to fovea, Microaneurysms, Retinal pigment epithelial mottling, refractile Epiretinal membrane, central cystic changes - stably improved   Vessels Vascular attenuation, Tortuous, AV crossing changes, mild Copper wiring attenuated, Tortuous   Periphery Attached, rare, scattered MA, good peripheral PRP 360 - room for posterior fill in if needed Attached, rare, scattered MA, Peripheral PRP 360 -- room for posterior fill in if needed        Refraction    Wearing Rx      Sphere Cylinder Axis Add   Right -0.75 +1.75 175 +3.00   Left -0.25 +1.50 180 +3.00   Type: Bifocal          IMAGING AND PROCEDURES  Imaging and Procedures for _0 @  OCT, Retina - OU - Both Eyes       Right Eye Quality was good. Central Foveal Thickness: 345. Progression has improved. Findings include abnormal foveal contour, intraretinal fluid, no SRF, retinal drusen , outer retinal atrophy (Mild interval improvement in central cystic changes).   Left Eye Quality was good. Central Foveal Thickness: 245. Progression has been stable. Findings include no SRF, outer retinal atrophy, retinal drusen , no IRF, normal foveal contour (Trace Persistent cystic changes).   Notes *Images captured and stored on drive  Diagnosis / Impression:  DME OU, OD>OS OD: Mild interval improvement in central cystic changes OS: tr persistent cystic changes  Clinical management:  See below  Abbreviations: NFP - Normal foveal profile. CME - cystoid macular edema. PED - pigment epithelial detachment. IRF - intraretinal fluid. SRF - subretinal fluid. EZ - ellipsoid zone. ERM - epiretinal membrane. ORA - outer retinal atrophy. ORT - outer retinal tubulation. SRHM - subretinal hyper-reflective material         Intravitreal Injection, Pharmacologic Agent - OD - Right Eye       Time  Out 02/07/2021. 10:05 AM. Confirmed correct patient, procedure, site, and patient consented.   Anesthesia Topical anesthesia was used. Anesthetic medications included Lidocaine 2%, Proparacaine 0.5%.   Procedure Preparation included 5% betadine to ocular surface, eyelid speculum. A (32g) needle was used.   Injection:  2 mg aflibercept Alfonse Flavors) SOLN   NDC: A3590391, Lot: [id number], Expiration date: 09/27/2021   Route: Intravitreal, Site: Right Eye, Waste: 0.05 mL  Post-op Post injection exam found visual acuity of at least counting fingers. The patient tolerated the procedure well. There were no complications. The patient received written and verbal post procedure care education. Post injection medications were not given.                 ASSESSMENT/PLAN:    ICD-10-CM   1. Moderate nonproliferative diabetic retinopathy of both eyes with macular edema associated with type 2 diabetes mellitus (HCC)  W73.7106 Intravitreal Injection, Pharmacologic Agent - OD - Right Eye    aflibercept (EYLEA) SOLN 2 mg  2. Retinal edema  H35.81 OCT, Retina - OU - Both Eyes  3. Essential hypertension  I10   4. Hypertensive retinopathy of both eyes  H35.033   5. Pseudophakia of both eyes  Z96.1     1,2. Moderate non-proliferative diabetic retinopathy with edema OU   - moved to [place] from [place], [place] --  history of prior injections OS with Dr. [name]  - records received and reviewed from [facility] (Dr. [name])  - history of focal laser OS x2 and IVK/IVT OU in [place] -- last injection was IVK OS November 2013  - initial exam: scattered [place], DBH and +macular edema  - initial OCT: diabetic macular edema, both eyes, (OD > OS)  - FA (10.1.19) showed late leaking MA OU -- no NV  - delayed f/u on 4.16.20 due to hospitalization for sepsis / gallstones  - S/P PRP OD (07.14.20) - good laser surrounding, room for posterior fill in if needed  - S/P PRP OS (08.26.20)  - S/P IVA OD #1  (09.17.19), #2 (10.29.19), #3 (11.26.19), #4 (01.02.20), #5 (01.30.20), #6 (02.28.20), #7 (04.16.20), #8 (06.11.20), #9 (08.12.20), #10 (01.11.21)  - S/P IVA OS #1 (10.01.19), #2 (10.29.19), #3 (11.26.19), #4 (01.02.20), #5 (01.30.20), #6 (02.28.20), #7 (04.16.20), #8 (05.14.20), #9 (06.11.20), #10 (07.09.20), #11 (08.12.20), #12 (01.11.21)  - S/P IVK OD #1 (05.14.20) -- no significant improvement  - switched back to Avastin OD (#9 6.11.2020)  - repeated Eylea4U benefits investigation due to possible IVA resistance (8.12.20)  - S/P IVE OS #1 (09.16.20), #2 (10.16.20), #3 (11.13.20), #4 (12.11.20), #5 (02.08.21), #6 (03.12.21), #7 (04.16.21), #8 (05.14.21), #9 (06.18.21), #10 (07.23.21), #11 (8.30.21), #12 (10.05.21), #13 (11.10.21), #14 (12.15.21), #15 (1.18.22), #16 (03.17.22)  - S/P IVE OD #1 (09.16.20), #2 (10.16.20), #3 (11.13.20), #4 (12.11.20), #5 (02.08.21), #6 (03.12.21), #7 (04.16.21), #8 (05.14.21), #9 (06.18.21), #10 (07.23.21), #11 (08.30.21), #12 (10.05.21), #13 (11.10.21), #14 (12.15.21), #15 (01.18.22), #16 (03.17.22)  - OCT today shows OD: interval improvement in IRF; OS: tr persistent cystic changes   - BCVA stable at 20/30 OU  - recommend IVE OD #17 only today, 05.12.22 w/ f/u in 8 wks -- will hold off on OS  - pt in agreement  - Eylea4U benefits investigation completed and verified for 2022 as of 01.18.22  - RBA of procedure discussed, questions answered   - informed consent obtained  - see procedure note  - Eylea informed consent form signed and scanned on 12.11.2020  - f/u 8 weeks, DFE, OCT, possible injection  3,4. Hypertensive retinopathy OU  - discussed importance of tight BP control  - monitor  5. Pseudophakia OU  - s/p CE/IOL OU in [employer], [place]  - beautiful surgeries, doing well  - monitor  Ophthalmic Meds Ordered this visit:  Meds ordered this encounter  Medications  . aflibercept (EYLEA) SOLN 2 mg      Return in about 8 weeks (around 04/04/2021) for f/u  NPDR OU, DFE, OCT.  There are no Patient Instructions on file for this visit.  This document serves as a record of services personally performed by Gardiner Sleeper, MD, PhD. It was created on their behalf by Leonie Douglas, an ophthalmic technician. The creation of this record is the provider's dictation and/or activities during the visit.    Electronically signed by: Leonie Douglas COA, 02/07/21  11:34 AM   This document serves as a record of services personally performed by Gardiner Sleeper, MD, PhD. It was created on their behalf by San Jetty. Owens Shark, OA an ophthalmic technician. The creation of this record is the provider's dictation and/or activities during the visit.    Electronically signed by: San Jetty. Marguerita Merles 05.12.2022 11:34 AM   Gardiner Sleeper, M.D., Ph.D. Diseases & Surgery of the Retina and [place] 02/07/2021   I  have reviewed the above documentation for accuracy and completeness, and I agree with the above. Gardiner Sleeper, M.D., Ph.D. 02/07/21 11:34 AM  Abbreviations: M myopia (nearsighted); A astigmatism; H hyperopia (farsighted); P presbyopia; Mrx spectacle prescription;  CTL contact lenses; OD right eye; OS left eye; OU both eyes  XT exotropia; ET esotropia; PEK punctate epithelial keratitis; PEE punctate epithelial erosions; DES dry eye syndrome; MGD meibomian gland dysfunction; ATs artificial tears; PFAT's preservative free artificial tears; [place] nuclear sclerotic cataract; PSC posterior subcapsular cataract; ERM epi-retinal membrane; PVD posterior vitreous detachment; RD retinal detachment; DM diabetes mellitus; DR diabetic retinopathy; NPDR non-proliferative diabetic retinopathy; PDR proliferative diabetic retinopathy; CSME clinically significant macular edema; DME diabetic macular edema; dbh dot blot hemorrhages; CWS cotton wool spot; POAG primary open angle glaucoma; C/D cup-to-disc ratio; HVF humphrey visual field; GVF goldmann visual  field; OCT optical coherence tomography; IOP intraocular pressure; BRVO Branch retinal vein occlusion; CRVO central retinal vein occlusion; CRAO central retinal artery occlusion; BRAO branch retinal artery occlusion; RT retinal tear; SB scleral buckle; PPV pars plana vitrectomy; VH Vitreous hemorrhage; PRP panretinal laser photocoagulation; IVK intravitreal kenalog; VMT vitreomacular traction; MH Macular hole;  NVD neovascularization of the disc; NVE neovascularization elsewhere; AREDS age related eye disease study; ARMD age related macular degeneration; POAG primary open angle glaucoma; EBMD epithelial/anterior basement membrane dystrophy; ACIOL anterior chamber intraocular lens; IOL intraocular lens; PCIOL posterior chamber intraocular lens; Phaco/IOL phacoemulsification with intraocular lens placement; [place] photorefractive keratectomy; LASIK laser assisted in situ keratomileusis; HTN hypertension; DM diabetes mellitus; COPD chronic obstructive pulmonary disease

## 2021-02-06 ENCOUNTER — Telehealth: Payer: Self-pay

## 2021-02-06 DIAGNOSIS — I739 Peripheral vascular disease, unspecified: Secondary | ICD-10-CM

## 2021-02-06 NOTE — Telephone Encounter (Signed)
ICD-10-CM   1. Claudication in peripheral vascular disease (HCC)  I73.9 ABI WITH/WO TBI   Test to be done at [facility] and will schedule OV with me later.   [name], MD, [facility] 02/06/2021, 10:01 PM Office: [phone] Pager: [phone]

## 2021-02-06 NOTE — Telephone Encounter (Signed)
Patient called and asked if he needed to have another lower Arterial scan because his neuropathy has gotten a lot worse and  He cannot walk very well.

## 2021-02-07 ENCOUNTER — Ambulatory Visit (INDEPENDENT_AMBULATORY_CARE_PROVIDER_SITE_OTHER): Payer: Medicare HMO | Admitting: Ophthalmology

## 2021-02-07 ENCOUNTER — Other Ambulatory Visit: Payer: Self-pay

## 2021-02-07 ENCOUNTER — Encounter (INDEPENDENT_AMBULATORY_CARE_PROVIDER_SITE_OTHER): Payer: Self-pay | Admitting: Ophthalmology

## 2021-02-07 DIAGNOSIS — H35033 Hypertensive retinopathy, bilateral: Secondary | ICD-10-CM | POA: Diagnosis not present

## 2021-02-07 DIAGNOSIS — I10 Essential (primary) hypertension: Secondary | ICD-10-CM | POA: Diagnosis not present

## 2021-02-07 DIAGNOSIS — H3581 Retinal edema: Secondary | ICD-10-CM | POA: Diagnosis not present

## 2021-02-07 DIAGNOSIS — Z961 Presence of intraocular lens: Secondary | ICD-10-CM | POA: Diagnosis not present

## 2021-02-07 DIAGNOSIS — E113313 Type 2 diabetes mellitus with moderate nonproliferative diabetic retinopathy with macular edema, bilateral: Secondary | ICD-10-CM | POA: Diagnosis not present

## 2021-02-07 MED ORDER — AFLIBERCEPT 2MG/0.05ML IZ SOLN FOR KALEIDOSCOPE
2.0000 mg | INTRAVITREAL | Status: AC | PRN
  Administered 2021-02-07: 2 mg via INTRAVITREAL

## 2021-02-08 ENCOUNTER — Other Ambulatory Visit: Payer: Self-pay | Admitting: Student

## 2021-02-08 ENCOUNTER — Telehealth: Payer: Self-pay

## 2021-02-08 DIAGNOSIS — I10 Essential (primary) hypertension: Secondary | ICD-10-CM

## 2021-02-08 MED ORDER — HYDRALAZINE HCL 50 MG PO TABS: 50.0000 mg | ORAL_TABLET | Freq: Three times a day (TID) | ORAL | 3 refills | Status: DC

## 2021-02-08 MED ORDER — LOSARTAN POTASSIUM 25 MG PO TABS: 25.0000 mg | ORAL_TABLET | Freq: Every day | ORAL | 3 refills | Status: DC

## 2021-02-08 NOTE — Progress Notes (Signed)
Patient's blood pressure remains elevated, he recalls being on grandpa just not recall having issues knee issues while taking it.  We will therefore start patient on losartan 25 mg once daily with repeat BMP in 1 week.  Patient will continue to monitor his blood pressure on a daily basis and notify our office if it remains elevated.

## 2021-02-11 ENCOUNTER — Ambulatory Visit (HOSPITAL_COMMUNITY)
Admission: RE | Admit: 2021-02-11 | Discharge: 2021-02-11 | Disposition: A | Discharge status: Home / Self Care | Payer: Medicare HMO | Source: Ambulatory Visit | Attending: Cardiology | Admitting: Cardiology

## 2021-02-11 ENCOUNTER — Other Ambulatory Visit: Payer: Self-pay

## 2021-02-11 DIAGNOSIS — I739 Peripheral vascular disease, unspecified: Secondary | ICD-10-CM

## 2021-02-12 NOTE — Progress Notes (Signed)
ABI 02/11/2021: Right PT monophasic and non-compressible, DP biphasic and noncompressible.  TBI 0.59.  Right great toe pressure 102 mmHg. Left PT monophasic and noncompressible, DP monophasic and noncompressible.  TBI 0.42.  Left great toe pressure 73 mmHg. Bilateral TBI abnormal. Bilateral TBI suggest appropriate pressure for wound healing.  Patient has had peripheral arteriogram on 01/03/2020 revealing widely patent large vessels and small vessel disease.  In the absence of open wounds or nonhealing foot ulcer and absence of rest pain, recommend medical therapy.

## 2021-02-13 ENCOUNTER — Other Ambulatory Visit: Payer: Self-pay | Admitting: Student

## 2021-02-22 ENCOUNTER — Telehealth: Payer: Self-pay

## 2021-02-22 NOTE — Telephone Encounter (Signed)
BP READINGS Monday morning/evening thru this monrning  155/75 hr  56   155/75 hr 57 149/74 hr 59    149/71 Hr 58 149/74 hr 59    157/71 hr 59 145 69 hr 60     159/73 hr 55 152 75  Hr 57

## 2021-02-22 NOTE — Telephone Encounter (Signed)
Can you please schedule follow up with me in the next 1-2 weeks?

## 2021-02-25 NOTE — Progress Notes (Signed)
Primary Physician:  [name], MD   Patient ID: Justin [name], male    DOB: 11-24-1932, 87 y.o.   MRN: [id number]  Chief Complaint  Patient presents with  . Hypertension  . Coronary Artery Disease  . PAD  . carotid stenosis  . Follow-up   HPI:    Justin [name]  is a 87 y.o. male  male with history of CAD, S/P CABG in 2009, PAD, type II diabetes with autonomic insufficiency and autonomic orthostatic hypotension, asymptomatic carotid stenosis, and hyperlipidemia.  Justin [name] has patent graft except occluded SVG to RCA which is diffusely diseased small vessel and also severe below-knee bilateral lower disease by angiography on 11/04/2016 and repeat peripheral arteriogram on 01/03/20 and has severe slow flow suggestive of microvascular disease.   Patient presents for follow-up of uncontrolled hypertension.  At last office visit discontinued amlodipine and switched patient to hydralazine 25 mg 3 times daily, Justin [name] called the office with continued elevated blood pressure and was advised to increase hydralazine to 50 mg 3 times daily.  Blood pressure remained elevated per home monitor readings.  Therefore added losartan 25 mg once daily.  Repeat BMP revealed creatinine had increased slightly from 1.6-1.8.  Patient continues to admit to dietary noncompliance.  Denies chest pain, palpitations, syncope, near syncope, orthopnea, PND.  Justin [name] has slowly been increasing physical activity, and notes dyspnea on exertion since back surgery and February.  Review of previous hospital records revealed blood pressure was well controlled while in the hospital both prior to and following surgery, suspect high salt intake may be contributing to elevated blood pressure.   Patient continues to complain of thigh claudication.  Denies rest pain or lower extremity ulcers or wounds.  Past Medical History:  Diagnosis Date  . Anxiety   . Arthritis   . Carotid arterial disease ([place])   . CKD (chronic kidney disease)   .  Coronary artery disease   . Diabetes mellitus without complication ([place])   . Diabetic retinopathy ([place])    NPDR OU  . Diverticulitis   . Dyspnea   . GERD (gastroesophageal reflux disease)   . History of kidney stones 2021  . Hypercholesteremia   . Hypertension   . Hypertensive retinopathy    OU  . Myocardial infarction ([place]) 2009  . Pneumonia    as a child  . Prostate cancer ([place])   . UTI (lower urinary tract infection)     Past Surgical History:  Procedure Laterality Date  . ABDOMINAL SURGERY     for diverticulitis; also removed appendix  . APPENDECTOMY    . CARDIAC CATHETERIZATION  2018  . CATARACT EXTRACTION    . CATARACT EXTRACTION, BILATERAL    . CHOLECYSTECTOMY N/A 10/12/2017   Procedure: LAPAROSCOPIC CHOLECYSTECTOMY;  Surgeon: [name], MD;  Location: [place];  Service: General;  Laterality: N/A;  . CORONARY ARTERY BYPASS GRAFT  2009  . ERCP N/A 12/21/2018   Procedure: ENDOSCOPIC RETROGRADE CHOLANGIOPANCREATOGRAPHY (ERCP);  Surgeon: [name], MD;  Location: [place];  Service: Endoscopy;  Laterality: N/A;  . ESOPHAGOGASTRODUODENOSCOPY (EGD) WITH PROPOFOL N/A 12/17/2018   Procedure: ESOPHAGOGASTRODUODENOSCOPY (EGD) WITH PROPOFOL;  Surgeon: [name], MD;  Location: [place];  Service: Endoscopy;  Laterality: N/A;  . EUS Left 12/17/2018   Procedure: UPPER ENDOSCOPIC ULTRASOUND (EUS) LINEAR;  Surgeon: [name], MD;  Location: [place];  Service: Endoscopy;  Laterality: Left;  . EYE SURGERY     "for bleeding in eye"  . HERNIA REPAIR    .  LEFT HEART CATH AND CORONARY ANGIOGRAPHY N/A 11/04/2016   Procedure: Left Heart Cath and Coronary Angiography;  Surgeon: [name], MD;  Location: [place] [facility];  Service: Cardiovascular;  Laterality: N/A;  . LOWER EXTREMITY ANGIOGRAPHY N/A 11/04/2016   Procedure: Lower Extremity Angiography;  Surgeon: [name], MD;  Location: [place] [facility];  Service: Cardiovascular;  Laterality: N/A;  . LOWER  EXTREMITY ANGIOGRAPHY N/A 01/03/2020   Procedure: LOWER EXTREMITY ANGIOGRAPHY;  Surgeon: [name], MD;  Location: [place] [facility];  Service: Cardiovascular;  Laterality: N/A;  . LUMBAR LAMINECTOMY/DECOMPRESSION MICRODISCECTOMY Bilateral 11/09/2020   Procedure: Laminectomy and Foraminotomy - bilateral - Lumbar Four-Five.;  Surgeon: Earnie Larsson, MD;  Location: [place];  Service: Neurosurgery;  Laterality: Bilateral;  posterior  . PROSTATE SURGERY    . REMOVAL OF STONES  12/21/2018   Procedure: REMOVAL OF STONES;  Surgeon: [name], MD;  Location: [place];  Service: Endoscopy;;  . [name]  12/21/2018   Procedure: [name];  Surgeon: [name], MD;  Location[facility];  Service: Endoscopy;;   Family History  Problem Relation Age of Onset  . Diabetes Father   . Diabetes Maternal Aunt   . Diabetes Maternal Uncle   . Diabetes Maternal Grandmother   . Heart disease Sister   . Diabetes Brother   . Heart disease Sister    Social History   Tobacco Use  . Smoking status: Former Smoker    Packs/day: 0.25    Years: 2.00    Pack years: 0.50    Types: Cigarettes  . Smokeless tobacco: Never Used  . Tobacco comment: when Justin [name] was a teenage age [age]  Substance Use Topics  . Alcohol use: No    Marital Status: Widowed  ROS   Review of Systems  Constitutional: Negative for malaise/fatigue and weight gain.  Cardiovascular: Positive for dyspnea on exertion. Negative for chest pain, claudication, leg swelling, near-syncope, orthopnea, palpitations, paroxysmal nocturnal dyspnea and syncope.  Respiratory: Negative for shortness of breath.   Hematologic/Lymphatic: Does not bruise/bleed easily.  Musculoskeletal: Positive for back pain and muscle weakness.  Gastrointestinal: Negative for melena.  Neurological: Negative for dizziness and weakness.  All other systems reviewed and are negative.  Objective  Blood pressure (!) 149/67, pulse 67, temperature 98.2 F (36.8 C),  temperature source Temporal, resp. rate 17, height '5\' 7"'  (1.702 m), weight 201 lb (91.2 kg), SpO2 97 %. Body mass index is 31.48 kg/m.  Vitals with BMI 02/27/2021 01/09/2021 11/13/2020  Height '5\' 7"'  '5\' 7"'  '5\' 7"'   Weight 201 lbs 191 lbs 185 lbs  BMI 31.47 48.25 00.37  Systolic 048 889 169  Diastolic 67 64 72  Pulse 67 55 75   Physical Exam Vitals reviewed.  Constitutional:      Appearance: Justin [name] is well-developed.  HENT:     Head: Normocephalic and atraumatic.  Cardiovascular:     Rate and Rhythm: Normal rate and regular rhythm.     Pulses: Intact distal pulses.          Carotid pulses are on the right side with bruit and on the left side with bruit.      Femoral pulses are 2+ on the right side and 2+ on the left side.      Popliteal pulses are 1+ on the right side and 1+ on the left side.       Dorsalis pedis pulses are 0 on the right side and 0 on the left side.  Posterior tibial pulses are 0 on the right side and 0 on the left side.     Heart sounds: Normal heart sounds, S1 normal and S2 normal. No murmur heard. No gallop.      Comments: Split S2. NO JVD. Pulmonary:     Effort: Pulmonary effort is normal. No accessory muscle usage or respiratory distress.     Breath sounds: Normal breath sounds. No wheezing, rhonchi or rales.  Musculoskeletal:     Right lower leg: Edema (1+ pitting) present.     Left lower leg: Edema (1+ pitting) present.  Skin:    General: Skin is warm and dry.     Capillary Refill: Capillary refill takes 2 to 3 seconds.     Comments: No wounds or ulcers noted.  Neurological:     Mental Status: Justin [name] is alert.    Laboratory examination:    CMP Latest Ref Rng & Units 02/26/2021 11/06/2020 10/09/2020  Glucose 65 - 99 mg/dL 145(H) 134(H) 203(H)  BUN 8 - 27 mg/dL 41(H) 30(H) 24  Creatinine 0.76 - 1.27 mg/dL 1.83(H) 1.64(H) 1.66(H)  Sodium 134 - 144 mmol/L 145(H) 139 141  Potassium 3.5 - 5.2 mmol/L 4.5 4.6 4.6  Chloride 96 - 106 mmol/L 115(H) 112(H) 109(H)   CO2 20 - 29 mmol/L 16(L) 19(L) 17(L)  Calcium 8.6 - 10.2 mg/dL 8.8 9.2 9.3  Total Protein 6.0 - 8.5 g/dL - - -  Total Bilirubin 0.0 - 1.2 mg/dL - - -  Alkaline Phos 39 - 117 IU/L - - -  AST 0 - 40 IU/L - - -  ALT 0 - 44 IU/L - - -   CBC Latest Ref Rng & Units 11/06/2020 01/03/2020 12/19/2019  WBC 4.0 - 10.5 K/uL 7.6 - 9.1  Hemoglobin 13.0 - 17.0 g/dL 13.4 12.9(L) 13.2  Hematocrit 39.0 - 52.0 % 43.3 38.0(L) 41.8  Platelets 150 - 400 K/uL 184 - 203   Lipid Panel     Component Value Date/Time   CHOL 162 03/02/2020 0819   TRIG 66 03/02/2020 0819   HDL 63 03/02/2020 0819   LDLCALC 86 03/02/2020 0819   HEMOGLOBIN A1C Lab Results  Component Value Date   HGBA1C 7.2 (H) 11/06/2020   MPG 159.94 11/06/2020   TSH No results for input(s): TSH in the last 8760 hours.   External labs:  Cholesterol, total 155.000 M 03/14/2019 HDL 61.000 M 03/14/2019 LDL 105 NHDL 94 Triglycerides 67.000 M 03/14/2019   A1C 7.100 % 06/08/2019; TSH 1.530 03/14/2019  Hemoglobin 12.600 G/ 07/04/2019; INR 1.300 12/14/2018 Platelets 198.000 X 07/04/2019  Creatinine, Serum 1.700 MG/ 07/04/2019 Potassium 4.100 03/21/2019 Magnesium 2.000 MG/ 09/17/2016 ALT (SGPT) 16.000 03/21/2019  BNP 46.800 PG 10/13/2016 Allergies   Allergies  Allergen Reactions  . Contrast Media [Iodinated Diagnostic Agents] Itching    PT STATES ALLERGY TO "CT DYE".  . Amlodipine Swelling    Leg swelling  . Dye Fdc Red [Red Dye] Swelling    CAT scan dye  . Ibuprofen Other (See Comments)    Upset stomach       Medications Prior to Visit:   Outpatient Medications Prior to Visit  Medication Sig Dispense Refill  . ACCU-CHEK SMARTVIEW test strip     . Accu-Chek Softclix Lancets lancets     . acetaminophen (TYLENOL) 500 MG tablet Take 1,000 mg by mouth 2 (two) times daily as needed for mild pain.    Marland Kitchen AMITIZA 24 MCG capsule Take 24 mcg by mouth as needed for constipation.     Marland Kitchen  Ascorbic Acid (VITAMIN C PO) Take 1 tablet by mouth daily.     Marland Kitchen aspirin EC 81 MG tablet Take 81 mg by mouth daily.    . Blood Glucose Monitoring Suppl (ACCU-CHEK GUIDE) w/Device KIT     . chlorthalidone (HYGROTON) 50 MG tablet Take 25 mg by mouth in the morning and at bedtime.    . cholecalciferol (VITAMIN D3) 25 MCG (1000 UNIT) tablet Take 1,000 Units by mouth daily.    . clonazePAM (KLONOPIN) 0.5 MG tablet Take 0.5 mg by mouth daily as needed for anxiety.    Marland Kitchen ezetimibe (ZETIA) 10 MG tablet Take 1 tablet (10 mg total) by mouth daily after supper. 90 tablet 3  . gabapentin (NEURONTIN) 300 MG capsule Take 300 mg by mouth 2 (two) times daily.    . hydrALAZINE (APRESOLINE) 50 MG tablet Take 1 tablet (50 mg total) by mouth 3 (three) times daily. 270 tablet 3  . HYDROcodone-acetaminophen (NORCO/VICODIN) 5-325 MG tablet Take 1 tablet by mouth every 4 (four) hours as needed for moderate pain. 20 tablet 0  . isosorbide mononitrate (IMDUR) 120 MG 24 hr tablet TAKE 1 TABLET EVERY DAY 90 tablet 2  . JANUVIA 100 MG tablet Take 100 mg by mouth daily.     Marland Kitchen ketoconazole (NIZORAL) 2 % cream Apply 1 application topically 2 (two) times daily.    Marland Kitchen LANTUS SOLOSTAR 100 UNIT/ML Solostar Pen Inject 15-50 Units into the skin See admin instructions. Inject 50 units in the morning and 15 units in the evening    . losartan (COZAAR) 25 MG tablet Take 1 tablet (25 mg total) by mouth daily. 90 tablet 3  . omeprazole (PRILOSEC) 40 MG capsule Take 40 mg by mouth daily.     . Polyvinyl Alcohol-Povidone (REFRESH OP) Place 1 drop into both eyes 3 (three) times daily as needed (dry eyes).    . Pyridoxine HCl (B-6 PO) Take 1 capsule by mouth daily.    . simvastatin (ZOCOR) 20 MG tablet Take 20 mg by mouth daily.    . tamsulosin (FLOMAX) 0.4 MG CAPS capsule Take 0.4 mg by mouth daily.    . vitamin B-12 (CYANOCOBALAMIN) 500 MCG tablet Take 500 mcg by mouth daily.    . vitamin E 180 MG (400 UNITS) capsule Take 400 Units by mouth daily.    [name] 2.5 MG TABS tablet TAKE 1 TABLET TWICE  DAILY (Patient taking differently: Take 2.5 mg by mouth 2 (two) times daily.) 180 tablet 3  . metoprolol tartrate (LOPRESSOR) 25 MG tablet Take 0.5 tablets (12.5 mg total) by mouth 2 (two) times daily. (Patient taking differently: Take 25 mg by mouth 2 (two) times daily.) 90 tablet 3  . clobetasol cream (TEMOVATE) 0.05 %     . Cyanocobalamin (B-12 PO) Take 1 capsule by mouth daily.    . methocarbamol (ROBAXIN) 500 MG tablet Take 1 tablet (500 mg total) by mouth 4 (four) times daily. 90 tablet 0   No facility-administered medications prior to visit.     Final Medications at End of Visit    Current Meds  Medication Sig  . ACCU-CHEK SMARTVIEW test strip   . Accu-Chek Softclix Lancets lancets   . acetaminophen (TYLENOL) 500 MG tablet Take 1,000 mg by mouth 2 (two) times daily as needed for mild pain.  Marland Kitchen AMITIZA 24 MCG capsule Take 24 mcg by mouth as needed for constipation.   . Ascorbic Acid (VITAMIN C PO) Take 1 tablet by mouth daily.  Marland Kitchen  aspirin EC 81 MG tablet Take 81 mg by mouth daily.  . Blood Glucose Monitoring Suppl (ACCU-CHEK GUIDE) w/Device KIT   . chlorthalidone (HYGROTON) 50 MG tablet Take 25 mg by mouth in the morning and at bedtime.  . cholecalciferol (VITAMIN D3) 25 MCG (1000 UNIT) tablet Take 1,000 Units by mouth daily.  . clonazePAM (KLONOPIN) 0.5 MG tablet Take 0.5 mg by mouth daily as needed for anxiety.  Marland Kitchen ezetimibe (ZETIA) 10 MG tablet Take 1 tablet (10 mg total) by mouth daily after supper.  . gabapentin (NEURONTIN) 300 MG capsule Take 300 mg by mouth 2 (two) times daily.  . hydrALAZINE (APRESOLINE) 50 MG tablet Take 1 tablet (50 mg total) by mouth 3 (three) times daily.  Marland Kitchen HYDROcodone-acetaminophen (NORCO/VICODIN) 5-325 MG tablet Take 1 tablet by mouth every 4 (four) hours as needed for moderate pain.  . isosorbide mononitrate (IMDUR) 120 MG 24 hr tablet TAKE 1 TABLET EVERY DAY  . JANUVIA 100 MG tablet Take 100 mg by mouth daily.   Marland Kitchen ketoconazole (NIZORAL) 2 % cream  Apply 1 application topically 2 (two) times daily.  Marland Kitchen labetalol (NORMODYNE) 100 MG tablet Take 1 tablet (100 mg total) by mouth 2 (two) times daily.  Marland Kitchen LANTUS SOLOSTAR 100 UNIT/ML Solostar Pen Inject 15-50 Units into the skin See admin instructions. Inject 50 units in the morning and 15 units in the evening  . losartan (COZAAR) 25 MG tablet Take 1 tablet (25 mg total) by mouth daily.  Marland Kitchen omeprazole (PRILOSEC) 40 MG capsule Take 40 mg by mouth daily.   . Polyvinyl Alcohol-Povidone (REFRESH OP) Place 1 drop into both eyes 3 (three) times daily as needed (dry eyes).  . Pyridoxine HCl (B-6 PO) Take 1 capsule by mouth daily.  . simvastatin (ZOCOR) 20 MG tablet Take 20 mg by mouth daily.  . tamsulosin (FLOMAX) 0.4 MG CAPS capsule Take 0.4 mg by mouth daily.  . vitamin B-12 (CYANOCOBALAMIN) 500 MCG tablet Take 500 mcg by mouth daily.  . vitamin E 180 MG (400 UNITS) capsule Take 400 Units by mouth daily.  [name] 2.5 MG TABS tablet TAKE 1 TABLET TWICE DAILY (Patient taking differently: Take 2.5 mg by mouth 2 (two) times daily.)  . [DISCONTINUED] metoprolol tartrate (LOPRESSOR) 25 MG tablet Take 0.5 tablets (12.5 mg total) by mouth 2 (two) times daily. (Patient taking differently: Take 25 mg by mouth 2 (two) times daily.)   Radiology:   No results found.  Cardiac Studies:   Lexiscan myoview stress test 04/17/2016: 1. Resting EKG demonstrates normal sinus rhythm, left axis deviation, right bundle branch block. Stress EKG is nondiagnostic for ischemia as a pharmacologic stress test. There are frequent PACs during the stress test. Stress symptoms included dyspnea. 2. The Perfusion images reveal the left ventricle to be mildly dilated at 132 mL both in rest and stress images. There is a moderate-sized inferior and inferoapical scar with very mild peri-infarct ischemia noted especially towards the apex. Left ventricle systolic function was moderate to severely depressed at 36%. 3. This is an  intermediate risk study, clinical correlation recommended.    Coronary angiogram 11/04/2016: Native RCA small and diffusely diseased with distal 70-80% stenosis. Occluded SVG to RCA. Patent SVG to OM-RI, LIMA to LAD. Medical therapy.  Echo- 12/24/2017 1. Left ventricle cavity is normal in size. Moderate concentric hypertrophy of the left ventricle. Normal global echocardiogram wall motion. Doppler evidence of grade I (impaired) diastolic dysfunction. Calculated EF 54%. 2. Left atrial cavity is mildly dilated.  3. Trileaflet aortic valve. Mild aortic valve leaflet calcification. 4. Mild to moderate mitral regurgitation. Mild calcification function of the mitral valve annulus. Mild mitral valve leaflet patient later uptitrated to calcification. 5. Trace tricuspid regurgitation.   Lower Extremity Arterial Duplex 11/30/2019:  No hemodynamically significant stenosis in bilateral lower extremity above  the knee.  Moderate velocity increase at the right mid posterior tibial artery.  suggests >50%stenosis.  Diffuse small vessel disease with moderately abnormal waveform right ankle  and severely abnormal waveform left ankle.  Non compressible vessels bilateral at the ankles suggests medial  calcinosis.   No significant change from 05/02/2019.  Peripheral arteriogram 01/03/2020: Distal abdominal aortogram with bifemoral arteriogram reveals widely patent iliac vessels and common femoral vessels.  Left SFA and left popliteal artery are widely patent with diffuse arterial sclerosis.  No luminal irregularity.  Below the left knee, two-vessel runoff in the form of PT and peroneal artery.  There is diffuse disease noted in both the peroneal and PT.  AT is occluded. Similarly on the right no significant disease in the SFA and there is probably a two-vessel runoff in the form of peroneal artery and PT.  AT is occluded. Recommendation: Patient has very slow flow suggestive of microvascular disease and also small  vessel disease.  Unless critical limb ischemia no reason for angioplasty, continue medical therapy.  50 mill contrast utilized.  PCV MYOCARDIAL PERFUSION WITH LEXISCAN 09/24/2020 Lexiscan nuclear stress test performed using 1-day protocol. SPECT images showed small sized, mild intensity, mildly reversible perfusion defect in apical to basal inferior myocardium. Stress LVEF 53%. Low risk study.  Carotid artery duplex 11/07/2020:  Stenosis in the right internal carotid artery (50-69%). The right PSV  internal/common carotid artery ratio of 5.65 is consistent with a stenosis  of >70%. Stenosis in the right external carotid artery (>50%).  Left internal carotid artery are consistent with stenosis in the range of  16-49%. Stenosis in the left common carotid artery (<50%). Stenosis in the  left external carotid artery (<50%).  There is heterogeneous plaque noted bilaterally.  Antegrade right vertebral artery flow. Antegrade left vertebral artery  flow.  Follow up in six months is appropriate if clinically indicated. Compared  to 04/06/2020, no significant change.   Echocardiogram 01/21/2021:  Left ventricle cavity is normal in size. Moderate concentric hypertrophy  of the left ventricle. Normal LV systolic function with EF 60%. Normal  global wall motion. Doppler evidence of grade I (impaired) diastolic  dysfunction, normal LAP.  Moderate (Grade II) mitral regurgitation.  Mild pulmonic regurgitation.  No evidence of pulmonary hypertension.  No significant change compared to previous study on 12/24/2017.   ABI 02/11/2021: Right PT monophasic and non-compressible, DP biphasic and noncompressible. TBI 0.59. Right great toe pressure 102 mmHg. Left PT monophasic and noncompressible, DP monophasic and noncompressible. TBI 0.42. Left great toe pressure 73 mmHg. Bilateral TBI abnormal. Bilateral TBI suggest appropriate pressure for wound healing.  Patient has had peripheral arteriogram on  01/03/2020 revealing widely patent large vessels and small vessel disease. In the absence of open wounds or nonhealing foot ulcer and absence of rest pain, recommend medical therapy.  EKG   EKG 10/02/2020: Sinus bradycardia at a rate of 54 bpm with borderline first-degree AV block. Left axis deviation, left anterior fascicular block.  Right bundle branch block.  Nonspecific T wave abnormality.  Compared to EKG 11/14/2019, bradycardia new.  EKG 11/14/2019: Normal sinus rhythm at 74 bpm with 1 PAC, left axis deviation, left anterior fasicular block,  RBBB. Nonspecific T wave abnormality.  Assessment:     ICD-10-CM   1. Primary hypertension  S88 Basic metabolic panel  2. Claudication in peripheral vascular disease (HCC)  I73.9   3. Asymptomatic bilateral carotid artery stenosis  I65.23   4. Hypercholesteremia  E78.00 Lipid Panel With LDL/HDL Ratio    Meds ordered this encounter  Medications  . labetalol (NORMODYNE) 100 MG tablet    Sig: Take 1 tablet (100 mg total) by mouth 2 (two) times daily.    Dispense:  180 tablet    Refill:  3   Medications Discontinued During This Encounter  Medication Reason  . Cyanocobalamin (B-12 PO) Error  . clobetasol cream (TEMOVATE) 0.05 % Error  . methocarbamol (ROBAXIN) 500 MG tablet Error  . metoprolol tartrate (LOPRESSOR) 25 MG tablet Change in therapy    Recommendations:   Justin [name]  is a 87 y.o. male with history of CAD, S/P CABG in 2009, PAD, type II diabetes with autonomic insufficiency and autonomic orthostatic hypotension, asymptomatic carotid stenosis, and hyperlipidemia.  Justin [name] has patent graft except occluded SVG to RCA which is diffusely diseased small vessel and also severe below-knee bilateral lower disease by angiography on 11/04/2016 and repeat peripheral arteriogram on 01/03/20 and has severe slow flow suggestive of microvascular disease.    Patient presents for follow-up of uncontrolled hypertension.  At last office visit discontinued  amlodipine and switched patient to hydralazine 25 mg 3 times daily, Justin [name] called the office with continued elevated blood pressure and was advised to increase hydralazine to 50 mg 3 times daily.  Blood pressure remained elevated per home monitor readings.  Therefore added losartan 25 mg once daily.  Repeat BMP revealed serum creatinine increased from 1.6-1.8.  Advised patient to increase fluid intake and will repeat BMP in 2 weeks to monitor renal function closely.  We will also obtain lipid profile testing at that time.  In regard to hypertension, patient's blood pressure remains uncontrolled.  Continue to suspect salt intake contributing to uncontrolled hypertension.  Also suspect patient's bilateral lower leg edema is related to dietary discretion as well given Justin [name] has had relatively recent echocardiogram and nuclear stress test which were stable and low risk respectively.  Again counseled patient regarding diet and lifestyle modifications.  In order to improve blood pressure control Will switch patient from metoprolol to labetalol 100 mg twice daily.  Patient will also enroll with remote patient monitoring in our office.  At this time we will hold off on increasing losartan or adding spironolactone given mild elevation of creatinine from baseline.  We will also avoid calcium channel blockers as patient has not tolerated amlodipine in the past.  Justin [name] has recently refilled isosorbide mononitrate, therefore will not make changes to it at this time however could consider switching him to isosorbide dinitrate 3 times daily in the future if necessary.  Reviewed and discussed with patient regarding results of recent ABI.  In the absence of open wounds or nonhealing ulcers recommend continued medical management.  Patient verbalized understanding and agreement.  Follow-up in 2 weeks, sooner if needed, for hypertension.   This was a 45-minute encounter with face-to-face counseling, medical records review, coordination  of care, explanation of complex medical issues, complex medical decision making.    Alethia Berthold, PA-C 02/27/2021, 1:02 PM Office: [phone]

## 2021-02-26 DIAGNOSIS — I10 Essential (primary) hypertension: Secondary | ICD-10-CM | POA: Diagnosis not present

## 2021-02-26 NOTE — Telephone Encounter (Signed)
done

## 2021-02-27 ENCOUNTER — Encounter: Payer: Self-pay | Admitting: Student

## 2021-02-27 ENCOUNTER — Ambulatory Visit: Payer: Medicare HMO | Admitting: Student

## 2021-02-27 ENCOUNTER — Other Ambulatory Visit: Payer: Self-pay

## 2021-02-27 VITALS — BP 149/67 | HR 67 | Temp 98.2°F | Resp 17 | Ht 67.0 in | Wt 201.0 lb

## 2021-02-27 DIAGNOSIS — I6523 Occlusion and stenosis of bilateral carotid arteries: Secondary | ICD-10-CM

## 2021-02-27 DIAGNOSIS — I739 Peripheral vascular disease, unspecified: Secondary | ICD-10-CM

## 2021-02-27 DIAGNOSIS — I10 Essential (primary) hypertension: Secondary | ICD-10-CM

## 2021-02-27 DIAGNOSIS — E7800 Pure hypercholesterolemia, unspecified: Secondary | ICD-10-CM

## 2021-02-27 LAB — BASIC METABOLIC PANEL
BUN/Creatinine Ratio: 22 (ref 10–24)
BUN: 41 mg/dL — ABNORMAL HIGH (ref 8–27)
CO2: 16 mmol/L — ABNORMAL LOW (ref 20–29)
Calcium: 8.8 mg/dL (ref 8.6–10.2)
Chloride: 115 mmol/L — ABNORMAL HIGH (ref 96–106)
Creatinine, Ser: 1.83 mg/dL — ABNORMAL HIGH (ref 0.76–1.27)
Glucose: 145 mg/dL — ABNORMAL HIGH (ref 65–99)
Potassium: 4.5 mmol/L (ref 3.5–5.2)
Sodium: 145 mmol/L — ABNORMAL HIGH (ref 134–144)
eGFR: 36 mL/min/{1.73_m2} — ABNORMAL LOW (ref >59)

## 2021-02-27 MED ORDER — LABETALOL HCL 100 MG PO TABS: 100.0000 mg | ORAL_TABLET | Freq: Two times a day (BID) | ORAL | 3 refills | Status: DC

## 2021-03-08 ENCOUNTER — Encounter (HOSPITAL_COMMUNITY): Payer: Self-pay | Admitting: Emergency Medicine

## 2021-03-08 ENCOUNTER — Other Ambulatory Visit: Payer: Self-pay

## 2021-03-08 ENCOUNTER — Ambulatory Visit (HOSPITAL_COMMUNITY)
Admission: EM | Admit: 2021-03-08 | Discharge: 2021-03-08 | Disposition: A | Discharge status: Home / Self Care | Payer: Medicare HMO | Attending: Urgent Care | Admitting: Urgent Care

## 2021-03-08 DIAGNOSIS — N189 Chronic kidney disease, unspecified: Secondary | ICD-10-CM | POA: Diagnosis not present

## 2021-03-08 DIAGNOSIS — N10 Acute tubulo-interstitial nephritis: Secondary | ICD-10-CM | POA: Insufficient documentation

## 2021-03-08 LAB — POCT URINALYSIS DIPSTICK, ED / UC
Glucose, UA: 100 mg/dL — AB
Nitrite: POSITIVE — AB
Protein, ur: >=300 mg/dL — AB
Specific Gravity, Urine: 1.02 (ref 1.005–1.030)
Urobilinogen, UA: 1 mg/dL (ref 0.0–1.0)
pH: 5 (ref 5.0–8.0)

## 2021-03-08 LAB — BASIC METABOLIC PANEL
Anion gap: 7 (ref 5–15)
BUN: 40 mg/dL — ABNORMAL HIGH (ref 8–23)
CO2: 16 mmol/L — ABNORMAL LOW (ref 22–32)
Calcium: 8.9 mg/dL (ref 8.9–10.3)
Chloride: 117 mmol/L — ABNORMAL HIGH (ref 98–111)
Creatinine, Ser: 2.13 mg/dL — ABNORMAL HIGH (ref 0.61–1.24)
GFR, Estimated: 30 mL/min — ABNORMAL LOW (ref >60)
Glucose, Bld: 247 mg/dL — ABNORMAL HIGH (ref 70–99)
Potassium: 4.8 mmol/L (ref 3.5–5.1)
Sodium: 140 mmol/L (ref 135–145)

## 2021-03-08 LAB — CBC
HCT: 36.4 % — ABNORMAL LOW (ref 39.0–52.0)
Hemoglobin: 10.8 g/dL — ABNORMAL LOW (ref 13.0–17.0)
MCH: 28 pg (ref 26.0–34.0)
MCHC: 29.7 g/dL — ABNORMAL LOW (ref 30.0–36.0)
MCV: 94.3 fL (ref 80.0–100.0)
Platelets: 156 10*3/uL (ref 150–400)
RBC: 3.86 MIL/uL — ABNORMAL LOW (ref 4.22–5.81)
RDW: 15.7 % — ABNORMAL HIGH (ref 11.5–15.5)
WBC: 7.2 10*3/uL (ref 4.0–10.5)
nRBC: 0 % (ref 0.0–0.2)

## 2021-03-08 MED ORDER — LIDOCAINE HCL (PF) 1 % IJ SOLN
INTRAMUSCULAR | Status: AC
  Filled 2021-03-08: qty 2

## 2021-03-08 MED ORDER — CEFTRIAXONE SODIUM 1 G IJ SOLR
1.0000 g | Freq: Once | INTRAMUSCULAR | Status: AC
  Administered 2021-03-08: 1 g via INTRAMUSCULAR

## 2021-03-08 MED ORDER — CEFTRIAXONE SODIUM 1 G IJ SOLR
INTRAMUSCULAR | Status: AC
  Filled 2021-03-08: qty 10

## 2021-03-08 MED ORDER — CIPROFLOXACIN HCL 250 MG PO TABS: 250.0000 mg | ORAL_TABLET | Freq: Two times a day (BID) | ORAL | 0 refills | Status: DC

## 2021-03-08 NOTE — ED Provider Notes (Signed)
Justin [name]   MRN: [id number] DOB: 06/06/1933  Subjective:   Justin [name] is a 87 y.o. male with pmh of CKD III presenting for 1 week history of persistent hematuria, now having right-sided lower back and flank pain.  Has a history of UTIs.  Denies fever, nausea, vomiting, dysuria, urinary frequency, confusion, sweating.  Patient does have a history of AKI, sepsis.   No current facility-administered medications for this encounter.  Current Outpatient Medications:    ACCU-CHEK SMARTVIEW test strip, , Disp: , Rfl:    Accu-Chek Softclix Lancets lancets, , Disp: , Rfl:    acetaminophen (TYLENOL) 500 MG tablet, Take 1,000 mg by mouth 2 (two) times daily as needed for mild pain., Disp: , Rfl:    AMITIZA 24 MCG capsule, Take 24 mcg by mouth as needed for constipation. , Disp: , Rfl:    Ascorbic Acid (VITAMIN C PO), Take 1 tablet by mouth daily., Disp: , Rfl:    aspirin EC 81 MG tablet, Take 81 mg by mouth daily., Disp: , Rfl:    Blood Glucose Monitoring Suppl (ACCU-CHEK GUIDE) w/Device KIT, , Disp: , Rfl:    chlorthalidone (HYGROTON) 50 MG tablet, Take 25 mg by mouth in the morning and at bedtime., Disp: , Rfl:    cholecalciferol (VITAMIN D3) 25 MCG (1000 UNIT) tablet, Take 1,000 Units by mouth daily., Disp: , Rfl:    clonazePAM (KLONOPIN) 0.5 MG tablet, Take 0.5 mg by mouth daily as needed for anxiety., Disp: , Rfl:    ezetimibe (ZETIA) 10 MG tablet, Take 1 tablet (10 mg total) by mouth daily after supper., Disp: 90 tablet, Rfl: 3   gabapentin (NEURONTIN) 300 MG capsule, Take 300 mg by mouth 2 (two) times daily., Disp: , Rfl:    hydrALAZINE (APRESOLINE) 50 MG tablet, Take 1 tablet (50 mg total) by mouth 3 (three) times daily., Disp: 270 tablet, Rfl: 3   HYDROcodone-acetaminophen (NORCO/VICODIN) 5-325 MG tablet, Take 1 tablet by mouth every 4 (four) hours as needed for moderate pain., Disp: 20 tablet, Rfl: 0   isosorbide mononitrate (IMDUR) 120 MG 24 hr tablet, TAKE 1 TABLET  EVERY DAY, Disp: 90 tablet, Rfl: 2   JANUVIA 100 MG tablet, Take 100 mg by mouth daily. , Disp: , Rfl:    ketoconazole (NIZORAL) 2 % cream, Apply 1 application topically 2 (two) times daily., Disp: , Rfl:    labetalol (NORMODYNE) 100 MG tablet, Take 1 tablet (100 mg total) by mouth 2 (two) times daily., Disp: 180 tablet, Rfl: 3   LANTUS SOLOSTAR 100 UNIT/ML Solostar Pen, Inject 15-50 Units into the skin See admin instructions. Inject 50 units in the morning and 15 units in the evening, Disp: , Rfl:    losartan (COZAAR) 25 MG tablet, Take 1 tablet (25 mg total) by mouth daily., Disp: 90 tablet, Rfl: 3   omeprazole (PRILOSEC) 40 MG capsule, Take 40 mg by mouth daily. , Disp: , Rfl:    Polyvinyl Alcohol-Povidone (REFRESH OP), Place 1 drop into both eyes 3 (three) times daily as needed (dry eyes)., Disp: , Rfl:    Pyridoxine HCl (B-6 PO), Take 1 capsule by mouth daily., Disp: , Rfl:    simvastatin (ZOCOR) 20 MG tablet, Take 20 mg by mouth daily., Disp: , Rfl:    tamsulosin (FLOMAX) 0.4 MG CAPS capsule, Take 0.4 mg by mouth daily., Disp: , Rfl:    vitamin B-12 (CYANOCOBALAMIN) 500 MCG tablet, Take 500 mcg by mouth daily., Disp: ,  Rfl:    vitamin E 180 MG (400 UNITS) capsule, Take 400 Units by mouth daily., Disp: , Rfl:    XARELTO 2.5 MG TABS tablet, TAKE 1 TABLET TWICE DAILY (Patient taking differently: Take 2.5 mg by mouth 2 (two) times daily.), Disp: 180 tablet, Rfl: 3   Allergies  Allergen Reactions   Contrast Media [Iodinated Diagnostic Agents] Itching    PT STATES ALLERGY TO "CT DYE".   Amlodipine Swelling    Leg swelling   Dye Fdc Red [Red Dye] Swelling    CAT scan dye   Ibuprofen Other (See Comments)    Upset stomach     Past Medical History:  Diagnosis Date   Anxiety    Arthritis    Carotid arterial disease (HCC)    CKD (chronic kidney disease)    Coronary artery disease    Diabetes mellitus without complication (HCC)    Diabetic retinopathy ([place])    NPDR OU   Diverticulitis     Dyspnea    GERD (gastroesophageal reflux disease)    History of kidney stones 2021   Hypercholesteremia    Hypertension    Hypertensive retinopathy    OU   Myocardial infarction [facility]) 2009   Pneumonia    as a child   Prostate cancer ([place])    UTI (lower urinary tract infection)      Past Surgical History:  Procedure Laterality Date   ABDOMINAL SURGERY     for diverticulitis; also removed appendix   APPENDECTOMY     CARDIAC CATHETERIZATION  2018   CATARACT EXTRACTION     CATARACT EXTRACTION, BILATERAL     CHOLECYSTECTOMY N/A 10/12/2017   Procedure: LAPAROSCOPIC CHOLECYSTECTOMY;  Surgeon: [name], MD;  Location: [place];  Service: General;  Laterality: N/A;   CORONARY ARTERY BYPASS GRAFT  2009   ERCP N/A 12/21/2018   Procedure: ENDOSCOPIC RETROGRADE CHOLANGIOPANCREATOGRAPHY (ERCP);  Surgeon: [name], MD;  Location: [place];  Service: Endoscopy;  Laterality: N/A;   ESOPHAGOGASTRODUODENOSCOPY (EGD) WITH PROPOFOL N/A 12/17/2018   Procedure: ESOPHAGOGASTRODUODENOSCOPY (EGD) WITH PROPOFOL;  Surgeon: [name], MD;  Location: [place];  Service: Endoscopy;  Laterality: N/A;   EUS Left 12/17/2018   Procedure: UPPER ENDOSCOPIC ULTRASOUND (EUS) LINEAR;  Surgeon: [name], MD;  Location: [place];  Service: Endoscopy;  Laterality: Left;   EYE SURGERY     "for bleeding in eye"   HERNIA REPAIR     LEFT HEART CATH AND CORONARY ANGIOGRAPHY N/A 11/04/2016   Procedure: Left Heart Cath and Coronary Angiography;  Surgeon: [name], MD;  Location: [place] CV LAB;  Service: Cardiovascular;  Laterality: N/A;   LOWER EXTREMITY ANGIOGRAPHY N/A 11/04/2016   Procedure: Lower Extremity Angiography;  Surgeon: [name], MD;  Location: [place] CV LAB;  Service: Cardiovascular;  Laterality: N/A;   LOWER EXTREMITY ANGIOGRAPHY N/A 01/03/2020   Procedure: LOWER EXTREMITY ANGIOGRAPHY;  Surgeon: [name], MD;  Location: [place] CV LAB;  Service: Cardiovascular;   Laterality: N/A;   LUMBAR LAMINECTOMY/DECOMPRESSION MICRODISCECTOMY Bilateral 11/09/2020   Procedure: Laminectomy and Foraminotomy - bilateral - Lumbar Four-Five.;  Surgeon: Earnie Larsson, MD;  Location: [place];  Service: Neurosurgery;  Laterality: Bilateral;  posterior   PROSTATE SURGERY     REMOVAL OF STONES  12/21/2018   Procedure: REMOVAL OF STONES;  Surgeon: [name], MD;  Location: [facility] ENDOSCOPY;  Service: Endoscopy;;   SPHINCTEROTOMY  12/21/2018   Procedure: [name];  Surgeon: [name], MD;  Location: [place];  Service: Endoscopy;;  Family History  Problem Relation Age of Onset   Diabetes Father    Diabetes Maternal Aunt    Diabetes Maternal Uncle    Diabetes Maternal Grandmother    Heart disease Sister    Diabetes Brother    Heart disease Sister     Social History   Tobacco Use   Smoking status: Former    Packs/day: 0.25    Years: 2.00    Pack years: 0.50    Types: Cigarettes   Smokeless tobacco: Never   Tobacco comments:    when he was a teenage age [age]  Vaping Use   Vaping Use: Never used  Substance Use Topics   Alcohol use: No   Drug use: No    ROS   Objective:   Vitals: BP (!) 158/62 (BP Location: Right Arm)   Pulse 69   Temp 98.5 F (36.9 C) (Oral)   Resp 16   SpO2 97%   Physical Exam Constitutional:      General: He is not in acute distress.    Appearance: Normal appearance. He is well-developed. He is not ill-appearing, toxic-appearing or diaphoretic.  HENT:     Head: Normocephalic and atraumatic.     Right Ear: External ear normal.     Left Ear: External ear normal.     Nose: Nose normal.     Mouth/Throat:     Mouth: Mucous membranes are moist.     Pharynx: Oropharynx is clear.  Eyes:     General: No scleral icterus.    Extraocular Movements: Extraocular movements intact.     Pupils: Pupils are equal, round, and reactive to light.  Cardiovascular:     Rate and Rhythm: Normal rate and regular rhythm.     Heart  sounds: Normal heart sounds. No murmur heard.   No friction rub. No gallop.  Pulmonary:     Effort: Pulmonary effort is normal. No respiratory distress.     Breath sounds: Normal breath sounds. No stridor. No wheezing, rhonchi or rales.  Abdominal:     General: Bowel sounds are normal. There is no distension.     Palpations: Abdomen is soft. There is no mass.     Tenderness: There is no abdominal tenderness. There is right CVA tenderness. There is no left CVA tenderness, guarding or rebound.  Skin:    General: Skin is warm and dry.  Neurological:     Mental Status: He is alert and oriented to person, place, and time.  Psychiatric:        Mood and Affect: Mood normal.        Behavior: Behavior normal.        Thought Content: Thought content normal.        Judgment: Judgment normal.    Results for orders placed or performed during the hospital encounter of 03/08/21 (from the past 24 hour(s))  POC Urinalysis dipstick     Status: Abnormal   Collection Time: 03/08/21 11:10 AM  Result Value Ref Range   Glucose, UA 100 (A) NEGATIVE mg/dL   Bilirubin Urine SMALL (A) NEGATIVE   Ketones, ur TRACE (A) NEGATIVE mg/dL   Specific Gravity, Urine 1.020 1.005 - 1.030   Hgb urine dipstick LARGE (A) NEGATIVE   pH 5.0 5.0 - 8.0   Protein, ur >=300 (A) NEGATIVE mg/dL   Urobilinogen, UA 1.0 0.0 - 1.0 mg/dL   Nitrite POSITIVE (A) NEGATIVE   Leukocytes,Ua SMALL (A) NEGATIVE    Assessment and Plan :  PDMP not reviewed this encounter.  1. Acute pyelonephritis   2. Chronic kidney disease, unspecified CKD stage     Creatinine clearance calculated and was determined to be 35 based off of his last creatinine level from May of this year.  As such we will use ciprofloxacin at 250 mg twice daily for management as an outpatient pyelonephritis.  IM ceftriaxone given in clinic.  Labs pending.  For now we will avoid ER visit as patient is hemodynamically stable.  Emphasized need for close follow-up on Sunday  but will also call patient later should his labs show critical levels warranting an ER visit.   Jaynee Eagles, PA-C 03/08/21 1212

## 2021-03-08 NOTE — ED Triage Notes (Signed)
Pt presents with blood in urine and right side lower back pain Xs 6-7 days.

## 2021-03-10 ENCOUNTER — Emergency Department (HOSPITAL_COMMUNITY): Payer: Medicare HMO

## 2021-03-10 ENCOUNTER — Other Ambulatory Visit: Payer: Self-pay

## 2021-03-10 ENCOUNTER — Encounter (HOSPITAL_COMMUNITY): Payer: Self-pay | Admitting: Pharmacy Technician

## 2021-03-10 ENCOUNTER — Ambulatory Visit (HOSPITAL_COMMUNITY)
Admission: EM | Admit: 2021-03-10 | Discharge: 2021-03-10 | Disposition: A | Discharge status: Home / Self Care | Payer: Medicare HMO | Attending: Urgent Care | Admitting: Urgent Care

## 2021-03-10 ENCOUNTER — Emergency Department (HOSPITAL_COMMUNITY)
Admission: EM | Admit: 2021-03-10 | Discharge: 2021-03-10 | Disposition: A | Discharge status: Home / Self Care | Payer: Medicare HMO | Attending: Emergency Medicine | Admitting: Emergency Medicine

## 2021-03-10 ENCOUNTER — Encounter (HOSPITAL_COMMUNITY): Payer: Self-pay

## 2021-03-10 DIAGNOSIS — N10 Acute tubulo-interstitial nephritis: Secondary | ICD-10-CM | POA: Insufficient documentation

## 2021-03-10 DIAGNOSIS — Z87891 Personal history of nicotine dependence: Secondary | ICD-10-CM | POA: Insufficient documentation

## 2021-03-10 DIAGNOSIS — E11319 Type 2 diabetes mellitus with unspecified diabetic retinopathy without macular edema: Secondary | ICD-10-CM | POA: Diagnosis not present

## 2021-03-10 DIAGNOSIS — I129 Hypertensive chronic kidney disease with stage 1 through stage 4 chronic kidney disease, or unspecified chronic kidney disease: Secondary | ICD-10-CM | POA: Diagnosis not present

## 2021-03-10 DIAGNOSIS — R944 Abnormal results of kidney function studies: Secondary | ICD-10-CM | POA: Diagnosis not present

## 2021-03-10 DIAGNOSIS — I2510 Atherosclerotic heart disease of native coronary artery without angina pectoris: Secondary | ICD-10-CM | POA: Diagnosis not present

## 2021-03-10 DIAGNOSIS — Z951 Presence of aortocoronary bypass graft: Secondary | ICD-10-CM | POA: Diagnosis not present

## 2021-03-10 DIAGNOSIS — N183 Chronic kidney disease, stage 3 unspecified: Secondary | ICD-10-CM | POA: Insufficient documentation

## 2021-03-10 DIAGNOSIS — K5750 Diverticulosis of both small and large intestine without perforation or abscess without bleeding: Secondary | ICD-10-CM | POA: Diagnosis not present

## 2021-03-10 DIAGNOSIS — N12 Tubulo-interstitial nephritis, not specified as acute or chronic: Secondary | ICD-10-CM

## 2021-03-10 DIAGNOSIS — Z7901 Long term (current) use of anticoagulants: Secondary | ICD-10-CM | POA: Insufficient documentation

## 2021-03-10 DIAGNOSIS — Z79899 Other long term (current) drug therapy: Secondary | ICD-10-CM | POA: Insufficient documentation

## 2021-03-10 DIAGNOSIS — E1122 Type 2 diabetes mellitus with diabetic chronic kidney disease: Secondary | ICD-10-CM | POA: Diagnosis not present

## 2021-03-10 DIAGNOSIS — Z9049 Acquired absence of other specified parts of digestive tract: Secondary | ICD-10-CM | POA: Diagnosis not present

## 2021-03-10 DIAGNOSIS — K219 Gastro-esophageal reflux disease without esophagitis: Secondary | ICD-10-CM | POA: Diagnosis not present

## 2021-03-10 DIAGNOSIS — Z8546 Personal history of malignant neoplasm of prostate: Secondary | ICD-10-CM | POA: Diagnosis not present

## 2021-03-10 DIAGNOSIS — Z7982 Long term (current) use of aspirin: Secondary | ICD-10-CM | POA: Insufficient documentation

## 2021-03-10 DIAGNOSIS — Z87442 Personal history of urinary calculi: Secondary | ICD-10-CM | POA: Diagnosis not present

## 2021-03-10 DIAGNOSIS — R319 Hematuria, unspecified: Secondary | ICD-10-CM | POA: Insufficient documentation

## 2021-03-10 DIAGNOSIS — I700 Atherosclerosis of aorta: Secondary | ICD-10-CM | POA: Diagnosis not present

## 2021-03-10 DIAGNOSIS — R109 Unspecified abdominal pain: Secondary | ICD-10-CM | POA: Diagnosis present

## 2021-03-10 LAB — CBC WITH DIFFERENTIAL/PLATELET
Abs Immature Granulocytes: 0.05 10*3/uL (ref 0.00–0.07)
Basophils Absolute: 0 10*3/uL (ref 0.0–0.1)
Basophils Relative: 1 %
Eosinophils Absolute: 0.2 10*3/uL (ref 0.0–0.5)
Eosinophils Relative: 3 %
HCT: 35.9 % — ABNORMAL LOW (ref 39.0–52.0)
Hemoglobin: 11.2 g/dL — ABNORMAL LOW (ref 13.0–17.0)
Immature Granulocytes: 1 %
Lymphocytes Relative: 18 %
Lymphs Abs: 1.2 10*3/uL (ref 0.7–4.0)
MCH: 28.6 pg (ref 26.0–34.0)
MCHC: 31.2 g/dL (ref 30.0–36.0)
MCV: 91.8 fL (ref 80.0–100.0)
Monocytes Absolute: 0.8 10*3/uL (ref 0.1–1.0)
Monocytes Relative: 11 %
Neutro Abs: 4.7 10*3/uL (ref 1.7–7.7)
Neutrophils Relative %: 66 %
Platelets: 159 10*3/uL (ref 150–400)
RBC: 3.91 MIL/uL — ABNORMAL LOW (ref 4.22–5.81)
RDW: 15.2 % (ref 11.5–15.5)
WBC: 7 10*3/uL (ref 4.0–10.5)
nRBC: 0 % (ref 0.0–0.2)

## 2021-03-10 LAB — POCT URINALYSIS DIPSTICK, ED / UC
Glucose, UA: NEGATIVE mg/dL
Leukocytes,Ua: NEGATIVE
Nitrite: NEGATIVE
Protein, ur: >=300 mg/dL — AB
Specific Gravity, Urine: 1.025 (ref 1.005–1.030)
Urobilinogen, UA: 0.2 mg/dL (ref 0.0–1.0)
pH: 5 (ref 5.0–8.0)

## 2021-03-10 LAB — BASIC METABOLIC PANEL
Anion gap: 6 (ref 5–15)
BUN: 32 mg/dL — ABNORMAL HIGH (ref 8–23)
CO2: 20 mmol/L — ABNORMAL LOW (ref 22–32)
Calcium: 8.8 mg/dL — ABNORMAL LOW (ref 8.9–10.3)
Chloride: 114 mmol/L — ABNORMAL HIGH (ref 98–111)
Creatinine, Ser: 2.13 mg/dL — ABNORMAL HIGH (ref 0.61–1.24)
GFR, Estimated: 30 mL/min — ABNORMAL LOW (ref >60)
Glucose, Bld: 176 mg/dL — ABNORMAL HIGH (ref 70–99)
Potassium: 5.1 mmol/L (ref 3.5–5.1)
Sodium: 140 mmol/L (ref 135–145)

## 2021-03-10 LAB — URINALYSIS, ROUTINE W REFLEX MICROSCOPIC

## 2021-03-10 LAB — URINE CULTURE: Culture: >=100000 — AB

## 2021-03-10 LAB — URINALYSIS, MICROSCOPIC (REFLEX)
Bacteria, UA: NONE SEEN
RBC / HPF: >50 RBC/hpf (ref 0–5)

## 2021-03-10 LAB — LACTIC ACID, PLASMA: Lactic Acid, Venous: 1.4 mmol/L (ref 0.5–1.9)

## 2021-03-10 MED ORDER — SODIUM CHLORIDE 0.9 % IV SOLN
1.0000 g | Freq: Once | INTRAVENOUS | Status: AC
  Administered 2021-03-10: 1 g via INTRAVENOUS
  Filled 2021-03-10: qty 10

## 2021-03-10 NOTE — ED Notes (Signed)
DC instructions reviewed with pt. Pt verbalized understanding.  Pt DC 

## 2021-03-10 NOTE — ED Triage Notes (Signed)
Pt in with c/o hematuria and flank pain that has been going on for 4 days   Pt states he was seen on 6/10 and his sx have not stopped

## 2021-03-10 NOTE — ED Provider Notes (Signed)
[place]   MRN: [id number] DOB: Jun 09, 1933  Subjective:   Justin [name] is a 87 y.o. male presenting for recheck on his pyelonephritis.  Patient was initially seen at 03/08/2021.  He was given IM ceftriaxone and due to his decreased creatinine clearance ciprofloxacin at a dose of 250 mg twice daily.  Was advised to follow-up today for recheck.  Reports that he is still having flank pain, painful urination and urinary frequency, frank blood in the urine.  Has been taking his antibiotics consistently.  Lab review did show a slight decrease in his GFR and increase in his creatinine.  Denies fever, nausea, vomiting, confusion, inability to urinate.  No current facility-administered medications for this encounter.  Current Outpatient Medications:    ACCU-CHEK SMARTVIEW test strip, , Disp: , Rfl:    Accu-Chek Softclix Lancets lancets, , Disp: , Rfl:    acetaminophen (TYLENOL) 500 MG tablet, Take 1,000 mg by mouth 2 (two) times daily as needed for mild pain., Disp: , Rfl:    AMITIZA 24 MCG capsule, Take 24 mcg by mouth as needed for constipation. , Disp: , Rfl:    Ascorbic Acid (VITAMIN C PO), Take 1 tablet by mouth daily., Disp: , Rfl:    aspirin EC 81 MG tablet, Take 81 mg by mouth daily., Disp: , Rfl:    Blood Glucose Monitoring Suppl (ACCU-CHEK GUIDE) w/Device KIT, , Disp: , Rfl:    chlorthalidone (HYGROTON) 50 MG tablet, Take 25 mg by mouth in the morning and at bedtime., Disp: , Rfl:    cholecalciferol (VITAMIN D3) 25 MCG (1000 UNIT) tablet, Take 1,000 Units by mouth daily., Disp: , Rfl:    ciprofloxacin (CIPRO) 250 MG tablet, Take 1 tablet (250 mg total) by mouth every 12 (twelve) hours., Disp: 20 tablet, Rfl: 0   clonazePAM (KLONOPIN) 0.5 MG tablet, Take 0.5 mg by mouth daily as needed for anxiety., Disp: , Rfl:    ezetimibe (ZETIA) 10 MG tablet, Take 1 tablet (10 mg total) by mouth daily after supper., Disp: 90 tablet, Rfl: 3   gabapentin (NEURONTIN) 300 MG capsule,  Take 300 mg by mouth 2 (two) times daily., Disp: , Rfl:    hydrALAZINE (APRESOLINE) 50 MG tablet, Take 1 tablet (50 mg total) by mouth 3 (three) times daily., Disp: 270 tablet, Rfl: 3   HYDROcodone-acetaminophen (NORCO/VICODIN) 5-325 MG tablet, Take 1 tablet by mouth every 4 (four) hours as needed for moderate pain., Disp: 20 tablet, Rfl: 0   isosorbide mononitrate (IMDUR) 120 MG 24 hr tablet, TAKE 1 TABLET EVERY DAY, Disp: 90 tablet, Rfl: 2   JANUVIA 100 MG tablet, Take 100 mg by mouth daily. , Disp: , Rfl:    ketoconazole (NIZORAL) 2 % cream, Apply 1 application topically 2 (two) times daily., Disp: , Rfl:    labetalol (NORMODYNE) 100 MG tablet, Take 1 tablet (100 mg total) by mouth 2 (two) times daily., Disp: 180 tablet, Rfl: 3   LANTUS SOLOSTAR 100 UNIT/ML Solostar Pen, Inject 15-50 Units into the skin See admin instructions. Inject 50 units in the morning and 15 units in the evening, Disp: , Rfl:    losartan (COZAAR) 25 MG tablet, Take 1 tablet (25 mg total) by mouth daily., Disp: 90 tablet, Rfl: 3   omeprazole (PRILOSEC) 40 MG capsule, Take 40 mg by mouth daily. , Disp: , Rfl:    Polyvinyl Alcohol-Povidone (REFRESH OP), Place 1 drop into both eyes 3 (three) times daily as needed (dry eyes).,  Disp: , Rfl:    Pyridoxine HCl (B-6 PO), Take 1 capsule by mouth daily., Disp: , Rfl:    simvastatin (ZOCOR) 20 MG tablet, Take 20 mg by mouth daily., Disp: , Rfl:    tamsulosin (FLOMAX) 0.4 MG CAPS capsule, Take 0.4 mg by mouth daily., Disp: , Rfl:    vitamin B-12 (CYANOCOBALAMIN) 500 MCG tablet, Take 500 mcg by mouth daily., Disp: , Rfl:    vitamin E 180 MG (400 UNITS) capsule, Take 400 Units by mouth daily., Disp: , Rfl:    XARELTO 2.5 MG TABS tablet, TAKE 1 TABLET TWICE DAILY (Patient taking differently: Take 2.5 mg by mouth 2 (two) times daily.), Disp: 180 tablet, Rfl: 3   Allergies  Allergen Reactions   Contrast Media [Iodinated Diagnostic Agents] Itching    PT STATES ALLERGY TO "CT DYE".    Amlodipine Swelling    Leg swelling   Dye Fdc Red [Red Dye] Swelling    CAT scan dye   Ibuprofen Other (See Comments)    Upset stomach     Past Medical History:  Diagnosis Date   Anxiety    Arthritis    Carotid arterial disease (HCC)    CKD (chronic kidney disease)    Coronary artery disease    Diabetes mellitus without complication (HCC)    Diabetic retinopathy ([place])    NPDR OU   Diverticulitis    Dyspnea    GERD (gastroesophageal reflux disease)    History of kidney stones 2021   Hypercholesteremia    Hypertension    Hypertensive retinopathy    OU   Myocardial infarction [facility]) 2009   Pneumonia    as a child   Prostate cancer ([place])    UTI (lower urinary tract infection)      Past Surgical History:  Procedure Laterality Date   ABDOMINAL SURGERY     for diverticulitis; also removed appendix   APPENDECTOMY     CARDIAC CATHETERIZATION  2018   CATARACT EXTRACTION     CATARACT EXTRACTION, BILATERAL     CHOLECYSTECTOMY N/A 10/12/2017   Procedure: LAPAROSCOPIC CHOLECYSTECTOMY;  Surgeon: [name], MD;  Location: [place];  Service: General;  Laterality: N/A;   CORONARY ARTERY BYPASS GRAFT  2009   ERCP N/A 12/21/2018   Procedure: ENDOSCOPIC RETROGRADE CHOLANGIOPANCREATOGRAPHY (ERCP);  Surgeon: [name], MD;  Location: [place];  Service: Endoscopy;  Laterality: N/A;   ESOPHAGOGASTRODUODENOSCOPY (EGD) WITH PROPOFOL N/A 12/17/2018   Procedure: ESOPHAGOGASTRODUODENOSCOPY (EGD) WITH PROPOFOL;  Surgeon: [name], MD;  Location: [place];  Service: Endoscopy;  Laterality: N/A;   EUS Left 12/17/2018   Procedure: UPPER ENDOSCOPIC ULTRASOUND (EUS) LINEAR;  Surgeon: [name], MD;  Location: [place];  Service: Endoscopy;  Laterality: Left;   EYE SURGERY     "for bleeding in eye"   HERNIA REPAIR     LEFT HEART CATH AND CORONARY ANGIOGRAPHY N/A 11/04/2016   Procedure: Left Heart Cath and Coronary Angiography;  Surgeon: [name], MD;  Location: [place]  CV LAB;  Service: Cardiovascular;  Laterality: N/A;   LOWER EXTREMITY ANGIOGRAPHY N/A 11/04/2016   Procedure: Lower Extremity Angiography;  Surgeon: [name], MD;  Location: [place] CV LAB;  Service: Cardiovascular;  Laterality: N/A;   LOWER EXTREMITY ANGIOGRAPHY N/A 01/03/2020   Procedure: LOWER EXTREMITY ANGIOGRAPHY;  Surgeon: [name], MD;  Location: [place] CV LAB;  Service: Cardiovascular;  Laterality: N/A;   LUMBAR LAMINECTOMY/DECOMPRESSION MICRODISCECTOMY Bilateral 11/09/2020   Procedure: Laminectomy and Foraminotomy - bilateral - Lumbar  Four-Five.;  Surgeon: Earnie Larsson, MD;  Location: [place];  Service: Neurosurgery;  Laterality: Bilateral;  posterior   PROSTATE SURGERY     REMOVAL OF STONES  12/21/2018   Procedure: REMOVAL OF STONES;  Surgeon: [name], MD;  Location: [place];  Service: Endoscopy;;   SPHINCTEROTOMY  12/21/2018   Procedure: [name];  Surgeon: [name], MD;  Location: [facility] ENDOSCOPY;  Service: Endoscopy;;    Family History  Problem Relation Age of Onset   Diabetes Father    Diabetes Maternal Aunt    Diabetes Maternal Uncle    Diabetes Maternal Grandmother    Heart disease Sister    Diabetes Brother    Heart disease Sister     Social History   Tobacco Use   Smoking status: Former    Packs/day: 0.25    Years: 2.00    Pack years: 0.50    Types: Cigarettes   Smokeless tobacco: Never   Tobacco comments:    when he was a teenage age [age]  Vaping Use   Vaping Use: Never used  Substance Use Topics   Alcohol use: No   Drug use: No    ROS   Objective:   Vitals: BP (!) 177/73 (BP Location: Right Arm)   Pulse 68   Temp 98.4 F (36.9 C) (Oral)   Resp 17   SpO2 98%   Physical Exam Constitutional:      General: He is not in acute distress.    Appearance: Normal appearance. He is well-developed. He is not ill-appearing, toxic-appearing or diaphoretic.  HENT:     Head: Normocephalic and atraumatic.     Right Ear: External  ear normal.     Left Ear: External ear normal.     Nose: Nose normal.     Mouth/Throat:     Mouth: Mucous membranes are moist.     Pharynx: Oropharynx is clear.  Eyes:     General: No scleral icterus.    Extraocular Movements: Extraocular movements intact.     Pupils: Pupils are equal, round, and reactive to light.  Cardiovascular:     Rate and Rhythm: Normal rate and regular rhythm.     Heart sounds: Normal heart sounds. No murmur heard.   No friction rub. No gallop.  Pulmonary:     Effort: Pulmonary effort is normal. No respiratory distress.     Breath sounds: Normal breath sounds. No stridor. No wheezing, rhonchi or rales.  Abdominal:     Tenderness: There is right CVA tenderness.  Neurological:     Mental Status: He is alert and oriented to person, place, and time.  Psychiatric:        Mood and Affect: Mood normal.        Behavior: Behavior normal.        Thought Content: Thought content normal.    Results for orders placed or performed during the hospital encounter of 03/10/21 (from the past 24 hour(s))  POC Urinalysis dipstick     Status: Abnormal   Collection Time: 03/10/21 10:27 AM  Result Value Ref Range   Glucose, UA NEGATIVE NEGATIVE mg/dL   Bilirubin Urine SMALL (A) NEGATIVE   Ketones, ur TRACE (A) NEGATIVE mg/dL   Specific Gravity, Urine 1.025 1.005 - 1.030   Hgb urine dipstick LARGE (A) NEGATIVE   pH 5.0 5.0 - 8.0   Protein, ur >=300 (A) NEGATIVE mg/dL   Urobilinogen, UA 0.2 0.0 - 1.0 mg/dL   Nitrite NEGATIVE NEGATIVE   Leukocytes,Ua NEGATIVE  NEGATIVE    Recent Results (from the past 2160 hour(s))  Basic metabolic panel     Status: Abnormal   Collection Time: 02/26/21  8:13 AM  Result Value Ref Range   Glucose 145 (H) 65 - 99 mg/dL   BUN 41 (H) 8 - 27 mg/dL   Creatinine, Ser 1.83 (H) 0.76 - 1.27 mg/dL   eGFR 36 (L) >59 mL/min/1.73   BUN/Creatinine Ratio 22 10 - 24   Sodium 145 (H) 134 - 144 mmol/L   Potassium 4.5 3.5 - 5.2 mmol/L   Chloride 115 (H)  96 - 106 mmol/L   CO2 16 (L) 20 - 29 mmol/L   Calcium 8.8 8.6 - 10.2 mg/dL  POC Urinalysis dipstick     Status: Abnormal   Collection Time: 03/08/21 11:10 AM  Result Value Ref Range   Glucose, UA 100 (A) NEGATIVE mg/dL   Bilirubin Urine SMALL (A) NEGATIVE   Ketones, ur TRACE (A) NEGATIVE mg/dL   Specific Gravity, Urine 1.020 1.005 - 1.030   Hgb urine dipstick LARGE (A) NEGATIVE   pH 5.0 5.0 - 8.0   Protein, ur >=300 (A) NEGATIVE mg/dL   Urobilinogen, UA 1.0 0.0 - 1.0 mg/dL   Nitrite POSITIVE (A) NEGATIVE   Leukocytes,Ua SMALL (A) NEGATIVE    Comment: Biochemical Testing Only. Please order routine urinalysis from main lab if confirmatory testing is needed.  Urine culture     Status: Abnormal   Collection Time: 03/08/21 11:11 AM   Specimen: Urine, Random  Result Value Ref Range   Specimen Description URINE, RANDOM    Special Requests      NONE Performed at [facility], [address], [place], [place] [address]    Culture >=100,000 COLONIES/mL KLEBSIELLA OXYTOCA (A)    Report Status 03/10/2021 FINAL    Organism ID, Bacteria KLEBSIELLA OXYTOCA (A)       Susceptibility   Klebsiella oxytoca - MIC*    AMPICILLIN RESISTANT Resistant     CEFAZOLIN <=4 SENSITIVE Sensitive     CEFEPIME <=0.12 SENSITIVE Sensitive     CEFTRIAXONE <=0.25 SENSITIVE Sensitive     CIPROFLOXACIN <=0.25 SENSITIVE Sensitive     GENTAMICIN <=1 SENSITIVE Sensitive     IMIPENEM <=0.25 SENSITIVE Sensitive     NITROFURANTOIN <=16 SENSITIVE Sensitive     TRIMETH/SULFA <=20 SENSITIVE Sensitive     AMPICILLIN/SULBACTAM <=2 SENSITIVE Sensitive     PIP/TAZO <=4 SENSITIVE Sensitive     * >=100,000 COLONIES/mL KLEBSIELLA OXYTOCA  CBC     Status: Abnormal   Collection Time: 03/08/21 11:39 AM  Result Value Ref Range   WBC 7.2 4.0 - 10.5 K/uL   RBC 3.86 (L) 4.22 - 5.81 MIL/uL   Hemoglobin 10.8 (L) 13.0 - 17.0 g/dL   HCT 36.4 (L) 39.0 - 52.0 %   MCV 94.3 80.0 - 100.0 fL   MCH 28.0 26.0 - 34.0 pg   MCHC 29.7  (L) 30.0 - 36.0 g/dL   RDW 15.7 (H) 11.5 - 15.5 %   Platelets 156 150 - 400 K/uL   nRBC 0.0 0.0 - 0.2 %    Comment: Performed at [facility], [place] [address], [place], [place] [address]  Basic metabolic panel     Status: Abnormal   Collection Time: 03/08/21 11:39 AM  Result Value Ref Range   Sodium 140 135 - 145 mmol/L   Potassium 4.8 3.5 - 5.1 mmol/L   Chloride 117 (H) 98 - 111 mmol/L   CO2  16 (L) 22 - 32 mmol/L   Glucose, Bld 247 (H) 70 - 99 mg/dL    Comment: Glucose reference range applies only to samples taken after fasting for at least 8 hours.   BUN 40 (H) 8 - 23 mg/dL   Creatinine, Ser 2.13 (H) 0.61 - 1.24 mg/dL   Calcium 8.9 8.9 - 10.3 mg/dL   GFR, Estimated 30 (L) >60 mL/min    Comment: (NOTE) Calculated using the CKD-EPI Creatinine Equation (2021)    Anion gap 7 5 - 15    Comment: Performed at [facility] [address] [place], [place] [address]  POC Urinalysis dipstick     Status: Abnormal   Collection Time: 03/10/21 10:27 AM  Result Value Ref Range   Glucose, UA NEGATIVE NEGATIVE mg/dL   Bilirubin Urine SMALL (A) NEGATIVE   Ketones, ur TRACE (A) NEGATIVE mg/dL   Specific Gravity, Urine 1.025 1.005 - 1.030   Hgb urine dipstick LARGE (A) NEGATIVE   pH 5.0 5.0 - 8.0   Protein, ur >=300 (A) NEGATIVE mg/dL   Urobilinogen, UA 0.2 0.0 - 1.0 mg/dL   Nitrite NEGATIVE NEGATIVE   Leukocytes,Ua NEGATIVE NEGATIVE    Comment: Biochemical Testing Only. Please order routine urinalysis from main lab if confirmatory testing is needed.     Assessment and Plan :   PDMP not reviewed this encounter.  1. Acute pyelonephritis   2. Decreased GFR     Urine culture was positive for Klebsiella infection with sensitivity to ciprofloxacin.  Unfortunately patient is failing outpatient management.  And due to his continued flank tenderness and hematuria together with his increased creatinine and decreased GFR change from his last level of will refer to the emergency room  for further evaluation and intervention.  Patient contracts for safety and will have his daughter drive him there now.   Jaynee Eagles, PA-C 03/10/21 1044

## 2021-03-10 NOTE — ED Provider Notes (Signed)
[facility] EMERGENCY DEPARTMENT Provider Note   CSN: [id number] Arrival date & time: 03/10/21  1047     History Chief Complaint  Patient presents with   Flank Pain    Justin [name] is a 87 y.o. male.   Flank Pain Pertinent negatives include no abdominal pain and no shortness of breath. Patient presents with hematuria and flank pain and UTI.  Sent from urgent care.  He has had around a week of symptoms.  Did not go to the urgent care until 2 days ago.  Diagnosed with a UTI at that time.  Culture sent showing Klebsiella.  Patient returns because he continues to have flank pain and hematuria.  States the pain is manageable.  No fevers or chills.  No nausea or vomiting.  States he sometimes feels that there is a little difficulty urinating but feels that he cannot empty his bladder completely.  States there is a fair amount of blood coming out.  Pain in his right flank.  No fevers or chills.     Past Medical History:  Diagnosis Date   Anxiety    Arthritis    Carotid arterial disease ([place])    CKD (chronic kidney disease)    Coronary artery disease    Diabetes mellitus without complication (HCC)    Diabetic retinopathy ([place])    NPDR OU   Diverticulitis    Dyspnea    GERD (gastroesophageal reflux disease)    History of kidney stones 2021   Hypercholesteremia    Hypertension    Hypertensive retinopathy    OU   Myocardial infarction [facility]) 2009   Pneumonia    as a child   Prostate cancer ([place])    UTI (lower urinary tract infection)     Patient Active Problem List   Diagnosis Date Noted   Lumbar stenosis with neurogenic claudication 11/09/2020   CKD stage 3 due to type 2 diabetes mellitus ([place]) 12/14/2019   Asymptomatic bilateral carotid artery stenosis 12/14/2019   Abdominal distention    CAD (coronary artery disease) 12/14/2018   Acute renal failure superimposed on stage 3 chronic kidney disease ([place]) 12/14/2018   UTI (urinary tract infection)  12/14/2018   HLD (hyperlipidemia) 12/14/2018   Sepsis ([place]) 12/14/2018   Abnormal LFTs    S/P CABG (coronary artery bypass graft) 12/06/2018   Asymptomatic stenosis of right carotid artery 12/06/2018   Claudication in peripheral vascular disease ([place]) 12/06/2018   Orthostatic hypotension 12/06/2018   Elevated liver enzymes 12/06/2018   Abdominal pain 10/12/2018   Abnormal liver function tests 10/12/2018   Foreign body in stomach 10/12/2018   Primary hypertension    GERD (gastroesophageal reflux disease)    Diabetes mellitus without complication ([place])    Atherosclerosis of native coronary artery of native heart without angina pectoris     Past Surgical History:  Procedure Laterality Date   ABDOMINAL SURGERY     for diverticulitis; also removed appendix   APPENDECTOMY     CARDIAC CATHETERIZATION  2018   CATARACT EXTRACTION     CATARACT EXTRACTION, BILATERAL     CHOLECYSTECTOMY N/A 10/12/2017   Procedure: LAPAROSCOPIC CHOLECYSTECTOMY;  Surgeon: [name], MD;  Location: [place];  Service: General;  Laterality: N/A;   CORONARY ARTERY BYPASS GRAFT  2009   ERCP N/A 12/21/2018   Procedure: ENDOSCOPIC RETROGRADE CHOLANGIOPANCREATOGRAPHY (ERCP);  Surgeon: [name], MD;  Location: [place];  Service: Endoscopy;  Laterality: N/A;   ESOPHAGOGASTRODUODENOSCOPY (EGD) WITH PROPOFOL N/A 12/17/2018  Procedure: ESOPHAGOGASTRODUODENOSCOPY (EGD) WITH PROPOFOL;  Surgeon: [name], MD;  Location: [place];  Service: Endoscopy;  Laterality: N/A;   EUS Left 12/17/2018   Procedure: UPPER ENDOSCOPIC ULTRASOUND (EUS) LINEAR;  Surgeon: [name], MD;  Location: [place];  Service: Endoscopy;  Laterality: Left;   EYE SURGERY     "for bleeding in eye"   HERNIA REPAIR     LEFT HEART CATH AND CORONARY ANGIOGRAPHY N/A 11/04/2016   Procedure: Left Heart Cath and Coronary Angiography;  Surgeon: [name], MD;  Location: [place] CV LAB;  Service: Cardiovascular;  Laterality: N/A;    LOWER EXTREMITY ANGIOGRAPHY N/A 11/04/2016   Procedure: Lower Extremity Angiography;  Surgeon: [name], MD;  Location: [place] CV LAB;  Service: Cardiovascular;  Laterality: N/A;   LOWER EXTREMITY ANGIOGRAPHY N/A 01/03/2020   Procedure: LOWER EXTREMITY ANGIOGRAPHY;  Surgeon: [name], MD;  Location: [place] CV LAB;  Service: Cardiovascular;  Laterality: N/A;   LUMBAR LAMINECTOMY/DECOMPRESSION MICRODISCECTOMY Bilateral 11/09/2020   Procedure: Laminectomy and Foraminotomy - bilateral - Lumbar Four-Five.;  Surgeon: Earnie Larsson, MD;  Location: [place];  Service: Neurosurgery;  Laterality: Bilateral;  posterior   PROSTATE SURGERY     REMOVAL OF STONES  12/21/2018   Procedure: REMOVAL OF STONES;  Surgeon: [name], MD;  Location: [place];  Service: Endoscopy;;   SPHINCTEROTOMY  12/21/2018   Procedure: [name];  Surgeon: [name], MD;  Location: [facility] ENDOSCOPY;  Service: Endoscopy;;       Family History  Problem Relation Age of Onset   Diabetes Father    Diabetes Maternal Aunt    Diabetes Maternal Uncle    Diabetes Maternal Grandmother    Heart disease Sister    Diabetes Brother    Heart disease Sister     Social History   Tobacco Use   Smoking status: Former    Packs/day: 0.25    Years: 2.00    Pack years: 0.50    Types: Cigarettes   Smokeless tobacco: Never   Tobacco comments:    when he was a teenage age [age]  Vaping Use   Vaping Use: Never used  Substance Use Topics   Alcohol use: No   Drug use: No    Home Medications Prior to Admission medications   Medication Sig Start Date End Date Taking? Authorizing Provider  acetaminophen (TYLENOL) 500 MG tablet Take 1,000 mg by mouth 2 (two) times daily as needed for mild pain.   Yes [provider]  aspirin EC 81 MG tablet Take 81 mg by mouth daily.   Yes [provider]  chlorthalidone (HYGROTON) 50 MG tablet Take 25 mg by mouth in the morning and at bedtime.   Yes [provider]  cholecalciferol (VITAMIN D3) 25 MCG (1000 UNIT) tablet Take 1,000 Units by mouth daily.   Yes [provider]  ciprofloxacin (CIPRO) 250 MG tablet Take 1 tablet (250 mg total) by mouth every 12 (twelve) hours. 03/08/21  Yes Jaynee Eagles, PA-C  clonazePAM (KLONOPIN) 0.5 MG tablet Take 0.5 mg by mouth daily as needed for anxiety.   Yes [provider]  ezetimibe (ZETIA) 10 MG tablet Take 1 tablet (10 mg total) by mouth daily after supper. 03/23/20 03/10/21 Yes [name], MD  gabapentin (NEURONTIN) 300 MG capsule Take 300 mg by mouth 2 (two) times daily. 01/26/20  Yes [provider]  hydrALAZINE (APRESOLINE) 50 MG tablet Take 1 tablet (50 mg total) by mouth 3 (three) times daily. 02/08/21 02/03/22  Yes Cantwell, Celeste C, PA-C  isosorbide mononitrate (IMDUR) 120 MG 24 hr tablet TAKE 1 TABLET EVERY DAY 01/14/21  Yes [name], MD  JANUVIA 100 MG tablet Take 100 mg by mouth daily.  10/24/16  Yes [provider]  ketoconazole (NIZORAL) 2 % cream Apply 1 application topically 2 (two) times daily. 08/30/20  Yes [provider]  labetalol (NORMODYNE) 100 MG tablet Take 1 tablet (100 mg total) by mouth 2 (two) times daily. 02/27/21  Yes Cantwell, Celeste C, PA-C  LANTUS SOLOSTAR 100 UNIT/ML Solostar Pen Inject 15-50 Units into the skin See admin instructions. Inject 50 units in the morning and 15 units in the evening. 09/10/16  Yes [provider]  losartan (COZAAR) 25 MG tablet Take 1 tablet (25 mg total) by mouth daily. 02/08/21 02/03/22 Yes Cantwell, Celeste C, PA-C  omeprazole (PRILOSEC) 40 MG capsule Take 40 mg by mouth daily.  11/22/18  Yes [provider]  Polyvinyl Alcohol-Povidone (REFRESH OP) Place 1 drop into both eyes 3 (three) times daily as needed (dry eyes).   Yes [provider]  Pyridoxine HCl (B-6 PO) Take 1 capsule by mouth daily.   Yes [provider]  simvastatin (ZOCOR) 20 MG tablet Take 20 mg by mouth daily. 01/26/20   Yes [provider]  tamsulosin (FLOMAX) 0.4 MG CAPS capsule Take 0.4 mg by mouth daily.   Yes [provider]  vitamin B-12 (CYANOCOBALAMIN) 500 MCG tablet Take 500 mcg by mouth daily.   Yes [provider]  vitamin E 180 MG (400 UNITS) capsule Take 400 Units by mouth daily.   Yes [provider]  XARELTO 2.5 MG TABS tablet TAKE 1 TABLET TWICE DAILY Patient taking differently: Take 2.5 mg by mouth 2 (two) times daily. 05/24/20  Yes [name], MD  ACCU-CHEK SMARTVIEW test strip  03/27/19   [provider]  Accu-Chek Softclix Lancets lancets  05/18/20   [provider]  Blood Glucose Monitoring Suppl (ACCU-CHEK GUIDE) w/Device KIT  05/08/20   [provider]  HYDROcodone-acetaminophen (NORCO/VICODIN) 5-325 MG tablet Take 1 tablet by mouth every 4 (four) hours as needed for moderate pain. Patient not taking: No sig reported 11/10/20 11/10/21  Marvis Moeller, NP    Allergies    Contrast media [iodinated diagnostic agents], Amlodipine, Dye fdc red [red dye], and Ibuprofen  Review of Systems   Review of Systems  Constitutional:  Negative for appetite change.  HENT:  Negative for congestion.   Respiratory:  Negative for shortness of breath.   Gastrointestinal:  Negative for abdominal pain.  Genitourinary:  Positive for flank pain and hematuria.  Musculoskeletal:  Negative for back pain.  Skin:  Negative for rash.  Neurological:  Negative for weakness.  Psychiatric/Behavioral:  Negative for confusion.    Physical Exam Updated Vital Signs BP (!) 183/73 (BP Location: Right Arm)   Pulse 63   Temp (!) 97.5 F (36.4 C) (Oral)   Resp 16   SpO2 98%   Physical Exam Vitals and nursing note reviewed.  HENT:     Head: Atraumatic.     Mouth/Throat:     Mouth: Mucous membranes are moist.  Eyes:     Pupils: Pupils are equal, round, and reactive to light.  Cardiovascular:     Rate and Rhythm: Regular rhythm.  Pulmonary:      Breath sounds: No wheezing or rhonchi.  Abdominal:     Tenderness: There is no abdominal tenderness.  Genitourinary:  Comments: CVA tenderness on the right. Musculoskeletal:     Cervical back: Neck supple.  Skin:    General: Skin is warm.     Capillary Refill: Capillary refill takes less than 2 seconds.  Neurological:     Mental Status: He is alert and oriented to person, place, and time.    ED Results / Procedures / Treatments   Labs (all labs ordered are listed, but only abnormal results are displayed) Labs Reviewed  CBC WITH DIFFERENTIAL/PLATELET - Abnormal; Notable for the following components:      Result Value   RBC 3.91 (*)    Hemoglobin 11.2 (*)    HCT 35.9 (*)    All other components within normal limits  BASIC METABOLIC PANEL - Abnormal; Notable for the following components:   Chloride 114 (*)    CO2 20 (*)    Glucose, Bld 176 (*)    BUN 32 (*)    Creatinine, Ser 2.13 (*)    Calcium 8.8 (*)    GFR, Estimated 30 (*)    All other components within normal limits  URINALYSIS, ROUTINE W REFLEX MICROSCOPIC - Abnormal; Notable for the following components:   Color, Urine RED (*)    APPearance TURBID (*)    Glucose, UA   (*)    Value: TEST NOT REPORTED DUE TO COLOR INTERFERENCE OF URINE PIGMENT   Hgb urine dipstick   (*)    Value: TEST NOT REPORTED DUE TO COLOR INTERFERENCE OF URINE PIGMENT   Bilirubin Urine   (*)    Value: TEST NOT REPORTED DUE TO COLOR INTERFERENCE OF URINE PIGMENT   Ketones, ur   (*)    Value: TEST NOT REPORTED DUE TO COLOR INTERFERENCE OF URINE PIGMENT   Protein, ur   (*)    Value: TEST NOT REPORTED DUE TO COLOR INTERFERENCE OF URINE PIGMENT   Nitrite   (*)    Value: TEST NOT REPORTED DUE TO COLOR INTERFERENCE OF URINE PIGMENT   Leukocytes,Ua   (*)    Value: TEST NOT REPORTED DUE TO COLOR INTERFERENCE OF URINE PIGMENT   All other components within normal limits  URINE CULTURE  LACTIC ACID, PLASMA  URINALYSIS, MICROSCOPIC (REFLEX)     EKG None  Radiology CT Renal Stone Study  Result Date: 03/10/2021 CLINICAL DATA:  Right flank pain.  History of kidney stones. EXAM: CT ABDOMEN AND PELVIS WITHOUT CONTRAST TECHNIQUE: Multidetector CT imaging of the abdomen and pelvis was performed following the standard protocol without IV contrast. COMPARISON:  10/12/2018 FINDINGS: Lower chest: Lung bases are clear. Median sternotomy wires are present. Calcified plaque over the visualized right coronary artery and descending thoracic aorta. Hepatobiliary: Previous cholecystectomy. Liver and biliary tree are normal. Pancreas: Normal. Spleen: Normal. Adrenals/Urinary Tract: Adrenal glands are normal. Kidneys are normal in size without hydronephrosis or nephrolithiasis. Ureters and bladder are normal. Stomach/Bowel: Again noted is a small metallic density over the stomach unchanged. Small bowel is normal. Appendix is not visualized. There is diverticulosis of the colon without evidence of acute inflammation. Surgical suture line is present over the sigmoid colon. Vascular/Lymphatic: Calcified plaque over the abdominal aorta which is normal in caliber. No adenopathy. Reproductive: Suggestion previous prostatectomy. Other: No free fluid or focal inflammatory change. Musculoskeletal: Degenerative change of the spine. IMPRESSION: 1. No acute findings in the abdomen/pelvis. No renal/ureteral stones or obstruction. 2. Colonic diverticulosis without evidence of acute inflammation. 3. Aortic atherosclerosis. Aortic Atherosclerosis (ICD10-I70.0). Electronically Signed   By: Marin Olp M.D.  On: 03/10/2021 12:43    Procedures Procedures   Medications Ordered in ED Medications  cefTRIAXone (ROCEPHIN) 1 g in sodium chloride 0.9 % 100 mL IVPB (0 g Intravenous Stopped 03/10/21 1501)    ED Course  I have reviewed the triage vital signs and the nursing notes.  Pertinent labs & imaging results that were available during my care of the patient were reviewed  by me and considered in my medical decision making (see chart for details).    MDM Rules/Calculators/A&P                          Patient sent in from urgent care for continued pyelonephritis.  Has had hematuria.  Urine culture showed Klebsiella.  Patient states pain does not worsen.'s been able eat and drink.  Still flank pain.  No fevers or chills.  States he just works at the hematuria is continuing.  Creatinine is above baseline but stable to 2 days ago.  White count is still reassuring.  Lactic acid normal.  CT scan done and showed no obstruction.  No stone.  Discussed with patient we feel is stable for discharge home.  Does not appear septic at this time.  With only 2 days and does feel little early to call her to failure of the Cipro.  Given extra dose of Rocephin here for extra coverage but stable for discharge home with urology follow-up as needed.  Will return if symptoms worsen. Final Clinical Impression(s) / ED Diagnoses Final diagnoses:  Pyelonephritis  Hematuria, unspecified type    Rx / DC Orders ED Discharge Orders     None        Davonna Belling, MD 03/10/21 1700

## 2021-03-10 NOTE — ED Provider Notes (Signed)
Emergency Medicine Provider Triage Evaluation Note  Justin [name] , a 87 y.o. male  was evaluated in triage.  Pt complains of blood in urine, right flank pain.  Originally went to urgent care and was diagnosed with a UTI on 6/10, he has been taking his antibiotics but feels like his symptoms are worse.  Had a recheck at urgent care earlier today and was referred to the ED for further treatment.  See urgent care note.  No history kidney stones  Review of Systems  Positive: Hematuria flank pain Negative: fever  Physical Exam  BP (!) 160/74 (BP Location: Right Arm)   Pulse 70   Temp 98.3 F (36.8 C)   Resp 19   SpO2 97%  Gen:   Awake, no distress   Resp:  Normal effort  MSK:   Moves extremities without difficulty  Other:  Right lateral flank tenderness. No suprapubic tenderness Skin:   No rash flank   Medical Decision Making  Medically screening exam initiated at 11:13 AM.  Appropriate orders placed.    Patient made aware this encounter is a triage and screening encounter and no beds are immediately available at this time in the ER.  Patient was informed that the remainder of the evaluation will be completed by another provider.  Patient made aware triage orders have been placed and patient will be placed in the waiting room while work up is initiated and until a room becomes available. Patient encouraged to await a formal ER encounter with a clinician.  Patient made aware that exiting the department prior to formal encounter with an ER clinician and completion of the work-up is considered leaving against medical advice.  At that time there is no guarantee that there are no emergency medical conditions present and patient assumes risks of leaving including worsening condition, permanent disability and death. Patient verbalizes understanding.     Kinnie Feil, PA-C 03/10/21 1114    Pattricia Boss, MD 03/12/21 [phone]

## 2021-03-10 NOTE — ED Notes (Signed)
ED Provider at bedside. 

## 2021-03-10 NOTE — Discharge Instructions (Addendum)
Continue the antibiotics you are on.  The culture shows the bacteria should be susceptible to it.  Try and keep her self hydrated.  If you cannot urinate or began to feel worse or difficulty keeping her medicines down return to the hospital.

## 2021-03-10 NOTE — ED Triage Notes (Signed)
Pt here from UC with ongoing R sided flank pain and now hematuria. Pt taking abx for UTI without relief. Denies fevers.

## 2021-03-10 NOTE — Discharge Instructions (Addendum)
Unfortunately, you are not improving with the antibiotic treatment that we have given you for your urinary tract infection, pyelonephritis. Your labs show a slight decrease in kidney function and all this means you will need a higher level of care than we can provide in the urgent care setting. Please have your daughter drive you to the hospital now for further evaluation and intervention.

## 2021-03-11 LAB — URINE CULTURE: Culture: NO GROWTH

## 2021-03-12 DIAGNOSIS — N3001 Acute cystitis with hematuria: Secondary | ICD-10-CM | POA: Diagnosis not present

## 2021-03-12 DIAGNOSIS — R311 Benign essential microscopic hematuria: Secondary | ICD-10-CM | POA: Diagnosis not present

## 2021-03-13 DIAGNOSIS — L602 Onychogryphosis: Secondary | ICD-10-CM | POA: Diagnosis not present

## 2021-03-13 DIAGNOSIS — E1151 Type 2 diabetes mellitus with diabetic peripheral angiopathy without gangrene: Secondary | ICD-10-CM | POA: Diagnosis not present

## 2021-03-13 DIAGNOSIS — L84 Corns and callosities: Secondary | ICD-10-CM | POA: Diagnosis not present

## 2021-03-14 ENCOUNTER — Other Ambulatory Visit: Payer: Self-pay | Admitting: Student

## 2021-03-14 DIAGNOSIS — N3001 Acute cystitis with hematuria: Secondary | ICD-10-CM | POA: Diagnosis not present

## 2021-03-14 DIAGNOSIS — I10 Essential (primary) hypertension: Secondary | ICD-10-CM | POA: Diagnosis not present

## 2021-03-14 DIAGNOSIS — E7800 Pure hypercholesterolemia, unspecified: Secondary | ICD-10-CM | POA: Diagnosis not present

## 2021-03-15 LAB — LIPID PANEL WITH LDL/HDL RATIO
Cholesterol, Total: 130 mg/dL (ref 100–199)
HDL: 49 mg/dL (ref >39)
LDL Chol Calc (NIH): 62 mg/dL (ref 0–99)
LDL/HDL Ratio: 1.3 ratio (ref 0.0–3.6)
Triglycerides: 104 mg/dL (ref 0–149)
VLDL Cholesterol Cal: 19 mg/dL (ref 5–40)

## 2021-03-15 LAB — BASIC METABOLIC PANEL
BUN/Creatinine Ratio: 16 (ref 10–24)
BUN: 34 mg/dL — ABNORMAL HIGH (ref 8–27)
CO2: 16 mmol/L — ABNORMAL LOW (ref 20–29)
Calcium: 9 mg/dL (ref 8.6–10.2)
Chloride: 109 mmol/L — ABNORMAL HIGH (ref 96–106)
Creatinine, Ser: 2.14 mg/dL — ABNORMAL HIGH (ref 0.76–1.27)
Glucose: 160 mg/dL — ABNORMAL HIGH (ref 65–99)
Potassium: 5 mmol/L (ref 3.5–5.2)
Sodium: 138 mmol/L (ref 134–144)
eGFR: 29 mL/min/{1.73_m2} — ABNORMAL LOW (ref >59)

## 2021-03-20 NOTE — Progress Notes (Signed)
Please notify pt his lipids are well controlled. His kidney function has deteriorated likely due to kidney infection. Please advise him to follow up with PCP for management of that. Will plan to reevaluate renal function when he has recovered from acute infection .

## 2021-03-21 DIAGNOSIS — E1142 Type 2 diabetes mellitus with diabetic polyneuropathy: Secondary | ICD-10-CM | POA: Diagnosis not present

## 2021-03-21 DIAGNOSIS — Z794 Long term (current) use of insulin: Secondary | ICD-10-CM | POA: Diagnosis not present

## 2021-03-21 DIAGNOSIS — E1122 Type 2 diabetes mellitus with diabetic chronic kidney disease: Secondary | ICD-10-CM | POA: Diagnosis not present

## 2021-03-21 DIAGNOSIS — Z7984 Long term (current) use of oral hypoglycemic drugs: Secondary | ICD-10-CM | POA: Diagnosis not present

## 2021-03-21 DIAGNOSIS — N189 Chronic kidney disease, unspecified: Secondary | ICD-10-CM | POA: Diagnosis not present

## 2021-03-21 DIAGNOSIS — D692 Other nonthrombocytopenic purpura: Secondary | ICD-10-CM | POA: Diagnosis not present

## 2021-03-21 NOTE — Progress Notes (Signed)
Called patient, NA, LMAM

## 2021-03-21 NOTE — Progress Notes (Signed)
Called and spoke with patient regarding his lab results.

## 2021-03-26 ENCOUNTER — Other Ambulatory Visit: Payer: Self-pay | Admitting: Cardiology

## 2021-03-26 DIAGNOSIS — I739 Peripheral vascular disease, unspecified: Secondary | ICD-10-CM

## 2021-03-26 DIAGNOSIS — E7800 Pure hypercholesterolemia, unspecified: Secondary | ICD-10-CM

## 2021-03-28 DIAGNOSIS — I10 Essential (primary) hypertension: Secondary | ICD-10-CM | POA: Diagnosis not present

## 2021-04-02 ENCOUNTER — Encounter: Payer: Self-pay | Admitting: Student

## 2021-04-02 ENCOUNTER — Other Ambulatory Visit: Payer: Self-pay

## 2021-04-02 ENCOUNTER — Ambulatory Visit: Payer: Medicare HMO | Admitting: Student

## 2021-04-02 VITALS — BP 129/56 | HR 72 | Temp 98.4°F | Resp 17 | Ht 67.0 in | Wt 200.0 lb

## 2021-04-02 DIAGNOSIS — I739 Peripheral vascular disease, unspecified: Secondary | ICD-10-CM | POA: Diagnosis not present

## 2021-04-02 DIAGNOSIS — M7989 Other specified soft tissue disorders: Secondary | ICD-10-CM | POA: Diagnosis not present

## 2021-04-02 DIAGNOSIS — I10 Essential (primary) hypertension: Secondary | ICD-10-CM | POA: Diagnosis not present

## 2021-04-02 MED ORDER — LABETALOL HCL 100 MG PO TABS: 200.0000 mg | ORAL_TABLET | Freq: Two times a day (BID) | ORAL | 3 refills | Status: DC

## 2021-04-02 MED ORDER — ISOSORBIDE DINITRATE 30 MG PO TABS: 30.0000 mg | ORAL_TABLET | Freq: Three times a day (TID) | ORAL | 3 refills | Status: DC

## 2021-04-02 NOTE — Progress Notes (Signed)
Primary Physician:  [name], MD   Patient ID: Justin [name], male    DOB: [birth year], 87 y.o.   MRN: [id number]  Chief Complaint  Patient presents with   Hypertension   Coronary Artery Disease   PAD   carotid stenosis    3 month   HPI:    Justin [name]  is a 87 y.o. male  male with history of CAD, S/P CABG in 2009, PAD, type II diabetes with autonomic insufficiency and autonomic orthostatic hypotension, asymptomatic carotid stenosis, and hyperlipidemia.  He has patent graft except occluded SVG to RCA which is diffusely diseased small vessel and also severe below-knee bilateral lower disease by angiography on 11/04/2016 and repeat peripheral arteriogram on 01/03/20 and has severe slow flow suggestive of microvascular disease.   Patient presents for 4-week follow-up of uncontrolled hypertension.  At last visit switch patient from metoprolol to labetalol 100 mg twice daily and enrolled him in remote patient monitoring with our office.  Recent lipid profile testing revealed lipids under excellent control.  Since last visit patient was seen in the emergency department multiple times from 03/08/2021 - 03/10/2021 for acute pyelonephritis and renal function deterioration.  Patient's primary concern today is regarding his continued neuropathy, which I advised him to follow-up with his PCP.  He also continues to have bilateral lower extremity swelling, however he does not wear compression stockings on a regular basis or elevate his legs consistently. Denies chest pain, palpitations, syncope, near syncope, orthopnea, PND.  He continues to deal with back pain since his surgery in February.  He is enrolled in remote patient monitoring and home blood pressure readings average 159/76 mmHg over the last 1 month.  Review of previous hospital records revealed blood pressure was well controlled while in the hospital both prior to and following surgery, suspect high salt intake may be contributing to  elevated blood pressure.   Past Medical History:  Diagnosis Date   Anxiety    Arthritis    Carotid arterial disease ([place])    CKD (chronic kidney disease)    Coronary artery disease    Diabetes mellitus without complication (HCC)    Diabetic retinopathy ([place])    NPDR OU   Diverticulitis    Dyspnea    GERD (gastroesophageal reflux disease)    History of kidney stones 2021   Hypercholesteremia    Hypertension    Hypertensive retinopathy    OU   Myocardial infarction [facility]) 2009   Pneumonia    as a child   Prostate cancer ([place])    UTI (lower urinary tract infection)     Past Surgical History:  Procedure Laterality Date   ABDOMINAL SURGERY     for diverticulitis; also removed appendix   APPENDECTOMY     CARDIAC CATHETERIZATION  2018   CATARACT EXTRACTION     CATARACT EXTRACTION, BILATERAL     CHOLECYSTECTOMY N/A 10/12/2017   Procedure: LAPAROSCOPIC CHOLECYSTECTOMY;  Surgeon: [name], MD;  Location: [place];  Service: General;  Laterality: N/A;   CORONARY ARTERY BYPASS GRAFT  2009   ERCP N/A 12/21/2018   Procedure: ENDOSCOPIC RETROGRADE CHOLANGIOPANCREATOGRAPHY (ERCP);  Surgeon: [name], MD;  Location: [place];  Service: Endoscopy;  Laterality: N/A;   ESOPHAGOGASTRODUODENOSCOPY (EGD) WITH PROPOFOL N/A 12/17/2018   Procedure: ESOPHAGOGASTRODUODENOSCOPY (EGD) WITH PROPOFOL;  Surgeon: [name], MD;  Location: [place];  Service: Endoscopy;  Laterality: N/A;   EUS Left 12/17/2018   Procedure: UPPER ENDOSCOPIC ULTRASOUND (EUS) LINEAR;  Surgeon: Benson Norway,  Saralyn Pilar, MD;  Location: [place];  Service: Endoscopy;  Laterality: Left;   EYE SURGERY     "for bleeding in eye"   HERNIA REPAIR     LEFT HEART CATH AND CORONARY ANGIOGRAPHY N/A 11/04/2016   Procedure: Left Heart Cath and Coronary Angiography;  Surgeon: [name], MD;  Location: [place] [facility];  Service: Cardiovascular;  Laterality: N/A;   LOWER EXTREMITY ANGIOGRAPHY N/A 11/04/2016   Procedure: Lower  Extremity Angiography;  Surgeon: [name], MD;  Location: [place] CV [facility];  Service: Cardiovascular;  Laterality: N/A;   LOWER EXTREMITY ANGIOGRAPHY N/A 01/03/2020   Procedure: LOWER EXTREMITY ANGIOGRAPHY;  Surgeon: [name], MD;  Location: [place] [facility];  Service: Cardiovascular;  Laterality: N/A;   LUMBAR LAMINECTOMY/DECOMPRESSION MICRODISCECTOMY Bilateral 11/09/2020   Procedure: Laminectomy and Foraminotomy - bilateral - Lumbar Four-Five.;  Surgeon: Earnie Larsson, MD;  Location: [place];  Service: Neurosurgery;  Laterality: Bilateral;  posterior   PROSTATE SURGERY     REMOVAL OF STONES  12/21/2018   Procedure: REMOVAL OF STONES;  Surgeon: [name], MD;  Location: [place];  Service: Endoscopy;;   SPHINCTEROTOMY  12/21/2018   Procedure: [name];  Surgeon: [name], MD;  Location: [facility] ENDOSCOPY;  Service: Endoscopy;;   Family History  Problem Relation Age of Onset   Diabetes Father    Diabetes Maternal Aunt    Diabetes Maternal Uncle    Diabetes Maternal Grandmother    Heart disease Sister    Diabetes Brother    Heart disease Sister    Social History   Tobacco Use   Smoking status: Former    Packs/day: 0.25    Years: 2.00    Pack years: 0.50    Types: Cigarettes   Smokeless tobacco: Never   Tobacco comments:    when he was a teenage age [age]  Substance Use Topics   Alcohol use: No    Marital Status: Widowed  ROS   Review of Systems  Constitutional: Negative for malaise/fatigue and weight gain.  Cardiovascular:  Positive for dyspnea on exertion. Negative for chest pain, claudication, leg swelling, near-syncope, orthopnea, palpitations, paroxysmal nocturnal dyspnea and syncope.  Musculoskeletal:  Positive for back pain and muscle weakness.  Gastrointestinal:  Negative for melena.  All other systems reviewed and are negative. Objective  Blood pressure (!) 129/56, pulse 72, temperature 98.4 F (36.9 C), temperature source Temporal, resp. rate 17,  height _0  (1.702 m), weight 200 lb (90.7 kg), SpO2 98 %. Body mass index is 31.32 kg/m.  Vitals with BMI 04/02/2021 03/10/2021 03/10/2021  Height _1  - -  Weight 200 lbs - -  BMI 45.80 - -  Systolic 998 338 250  Diastolic 56 73 65  Pulse 72 63 62   Physical Exam Vitals reviewed.  Constitutional:      Appearance: He is well-developed.  Cardiovascular:     Rate and Rhythm: Normal rate and regular rhythm.     Pulses: Intact distal pulses.          Carotid pulses are  on the right side with bruit and  on the left side with bruit.      Femoral pulses are 2+ on the right side and 2+ on the left side.      Popliteal pulses are 1+ on the right side and 1+ on the left side.       Dorsalis pedis pulses are 0 on the right side and 0 on the left side.  Posterior tibial pulses are 0 on the right side and 0 on the left side.     Heart sounds: Normal heart sounds, S1 normal and S2 normal. No murmur heard.   No gallop.     Comments: Split S2. NO JVD. Pulmonary:     Effort: Pulmonary effort is normal. No accessory muscle usage or respiratory distress.     Breath sounds: Normal breath sounds. No wheezing, rhonchi or rales.  Musculoskeletal:     Right lower leg: Edema (1+ pitting) present.     Left lower leg: Edema (1+ pitting) present.  Skin:    General: Skin is warm and dry.     Comments: No wounds or ulcers noted.   Laboratory examination:    CMP Latest Ref Rng & Units 03/14/2021 03/10/2021 03/08/2021  Glucose 65 - 99 mg/dL 160(H) 176(H) 247(H)  BUN 8 - 27 mg/dL 34(H) 32(H) 40(H)  Creatinine 0.76 - 1.27 mg/dL 2.14(H) 2.13(H) 2.13(H)  Sodium 134 - 144 mmol/L 138 140 140  Potassium 3.5 - 5.2 mmol/L 5.0 5.1 4.8  Chloride 96 - 106 mmol/L 109(H) 114(H) 117(H)  CO2 20 - 29 mmol/L 16(L) 20(L) 16(L)  Calcium 8.6 - 10.2 mg/dL 9.0 8.8(L) 8.9  Total Protein 6.0 - 8.5 g/dL - - -  Total Bilirubin 0.0 - 1.2 mg/dL - - -  Alkaline Phos 39 - 117 IU/L - - -  AST 0 - 40 IU/L - - -  ALT 0 - 44  IU/L - - -   CBC Latest Ref Rng & Units 03/10/2021 03/08/2021 11/06/2020  WBC 4.0 - 10.5 K/uL 7.0 7.2 7.6  Hemoglobin 13.0 - 17.0 g/dL 11.2(L) 10.8(L) 13.4  Hematocrit 39.0 - 52.0 % 35.9(L) 36.4(L) 43.3  Platelets 150 - 400 K/uL 159 156 184   Lipid Panel     Component Value Date/Time   CHOL 130 03/14/2021 0832   TRIG 104 03/14/2021 0832   HDL 49 03/14/2021 0832   LDLCALC 62 03/14/2021 0832   HEMOGLOBIN A1C Lab Results  Component Value Date   HGBA1C 7.2 (H) 11/06/2020   MPG 159.94 11/06/2020   TSH No results for input(s): TSH in the last 8760 hours.   External labs:  Cholesterol, total 155.000 M 03/14/2019 HDL 61.000 M 03/14/2019 LDL 105 NHDL 94 Triglycerides 67.000 M 03/14/2019   A1C 7.100 % 06/08/2019; TSH 1.530 03/14/2019  Hemoglobin 12.600 G/ 07/04/2019; INR 1.300 12/14/2018 Platelets 198.000 X 07/04/2019  Creatinine, Serum 1.700 MG/ 07/04/2019 Potassium 4.100 03/21/2019 Magnesium 2.000 MG/ 09/17/2016 ALT (SGPT) 16.000 03/21/2019  BNP 46.800 PG 10/13/2016 Allergies   Allergies  Allergen Reactions   Contrast Media [Iodinated Diagnostic Agents] Itching    PT STATES ALLERGY TO "CT DYE".   Amlodipine Swelling    Leg swelling   Dye Fdc Red [Red Dye] Swelling    CAT scan dye   Ibuprofen Other (See Comments)    Upset stomach       Medications Prior to Visit:   Outpatient Medications Prior to Visit  Medication Sig Dispense Refill   ACCU-CHEK SMARTVIEW test strip      Accu-Chek Softclix Lancets lancets      acetaminophen (TYLENOL) 500 MG tablet Take 1,000 mg by mouth 2 (two) times daily as needed for mild pain.     aspirin EC 81 MG tablet Take 81 mg by mouth daily.     Blood Glucose Monitoring Suppl (ACCU-CHEK GUIDE) w/Device KIT      cholecalciferol (VITAMIN D3) 25 MCG (1000 UNIT) tablet Take 1,000  Units by mouth daily.     clonazePAM (KLONOPIN) 0.5 MG tablet Take 0.5 mg by mouth daily as needed for anxiety.     ezetimibe (ZETIA) 10 MG tablet TAKE 1 TABLET DAILY AFTER  SUPPER 90 tablet 3   gabapentin (NEURONTIN) 300 MG capsule Take 300 mg by mouth 2 (two) times daily.     hydrALAZINE (APRESOLINE) 50 MG tablet Take 1 tablet (50 mg total) by mouth 3 (three) times daily. 270 tablet 3   JANUVIA 100 MG tablet Take 100 mg by mouth daily.      ketoconazole (NIZORAL) 2 % cream Apply 1 application topically 2 (two) times daily.     LANTUS SOLOSTAR 100 UNIT/ML Solostar Pen Inject 15-50 Units into the skin See admin instructions. Inject 50 units in the morning and 15 units in the evening.     losartan (COZAAR) 25 MG tablet Take 1 tablet (25 mg total) by mouth daily. 90 tablet 3   omeprazole (PRILOSEC) 40 MG capsule Take 40 mg by mouth daily.      Polyvinyl Alcohol-Povidone (REFRESH OP) Place 1 drop into both eyes 3 (three) times daily as needed (dry eyes).     Pyridoxine HCl (B-6 PO) Take 1 capsule by mouth daily.     simvastatin (ZOCOR) 20 MG tablet Take 20 mg by mouth daily.     tamsulosin (FLOMAX) 0.4 MG CAPS capsule Take 0.4 mg by mouth daily.     vitamin B-12 (CYANOCOBALAMIN) 500 MCG tablet Take 500 mcg by mouth daily.     vitamin E 180 MG (400 UNITS) capsule Take 400 Units by mouth daily.     XARELTO 2.5 MG TABS tablet TAKE 1 TABLET TWICE DAILY (Patient taking differently: Take 2.5 mg by mouth 2 (two) times daily.) 180 tablet 3   chlorthalidone (HYGROTON) 50 MG tablet Take 25 mg by mouth daily.     isosorbide mononitrate (IMDUR) 120 MG 24 hr tablet TAKE 1 TABLET EVERY DAY 90 tablet 2   labetalol (NORMODYNE) 100 MG tablet Take 1 tablet (100 mg total) by mouth 2 (two) times daily. 180 tablet 3   ciprofloxacin (CIPRO) 250 MG tablet Take 1 tablet (250 mg total) by mouth every 12 (twelve) hours. 20 tablet 0   HYDROcodone-acetaminophen (NORCO/VICODIN) 5-325 MG tablet Take 1 tablet by mouth every 4 (four) hours as needed for moderate pain. (Patient not taking: No sig reported) 20 tablet 0   No facility-administered medications prior to visit.     Final Medications at  End of Visit    Current Meds  Medication Sig   ACCU-CHEK SMARTVIEW test strip    Accu-Chek Softclix Lancets lancets    acetaminophen (TYLENOL) 500 MG tablet Take 1,000 mg by mouth 2 (two) times daily as needed for mild pain.   aspirin EC 81 MG tablet Take 81 mg by mouth daily.   Blood Glucose Monitoring Suppl (ACCU-CHEK GUIDE) w/Device KIT    cholecalciferol (VITAMIN D3) 25 MCG (1000 UNIT) tablet Take 1,000 Units by mouth daily.   clonazePAM (KLONOPIN) 0.5 MG tablet Take 0.5 mg by mouth daily as needed for anxiety.   ezetimibe (ZETIA) 10 MG tablet TAKE 1 TABLET DAILY AFTER SUPPER   gabapentin (NEURONTIN) 300 MG capsule Take 300 mg by mouth 2 (two) times daily.   hydrALAZINE (APRESOLINE) 50 MG tablet Take 1 tablet (50 mg total) by mouth 3 (three) times daily.   isosorbide dinitrate (ISORDIL) 30 MG tablet Take 1 tablet (30 mg total) by mouth 3 (three) times  daily.   JANUVIA 100 MG tablet Take 100 mg by mouth daily.    ketoconazole (NIZORAL) 2 % cream Apply 1 application topically 2 (two) times daily.   LANTUS SOLOSTAR 100 UNIT/ML Solostar Pen Inject 15-50 Units into the skin See admin instructions. Inject 50 units in the morning and 15 units in the evening.   losartan (COZAAR) 25 MG tablet Take 1 tablet (25 mg total) by mouth daily.   omeprazole (PRILOSEC) 40 MG capsule Take 40 mg by mouth daily.    Polyvinyl Alcohol-Povidone (REFRESH OP) Place 1 drop into both eyes 3 (three) times daily as needed (dry eyes).   Pyridoxine HCl (B-6 PO) Take 1 capsule by mouth daily.   simvastatin (ZOCOR) 20 MG tablet Take 20 mg by mouth daily.   tamsulosin (FLOMAX) 0.4 MG CAPS capsule Take 0.4 mg by mouth daily.   vitamin B-12 (CYANOCOBALAMIN) 500 MCG tablet Take 500 mcg by mouth daily.   vitamin E 180 MG (400 UNITS) capsule Take 400 Units by mouth daily.   XARELTO 2.5 MG TABS tablet TAKE 1 TABLET TWICE DAILY (Patient taking differently: Take 2.5 mg by mouth 2 (two) times daily.)   [DISCONTINUED]  chlorthalidone (HYGROTON) 50 MG tablet Take 25 mg by mouth daily.   [DISCONTINUED] isosorbide mononitrate (IMDUR) 120 MG 24 hr tablet TAKE 1 TABLET EVERY DAY   [DISCONTINUED] labetalol (NORMODYNE) 100 MG tablet Take 1 tablet (100 mg total) by mouth 2 (two) times daily.   Radiology:   No results found.  Cardiac Studies:   Lexiscan myoview stress test 04/17/2016: 1. Resting EKG demonstrates normal sinus rhythm, left axis deviation, right bundle branch block.  Stress EKG is nondiagnostic for ischemia as a pharmacologic stress test.  There are frequent PACs during the stress test.  Stress symptoms included dyspnea. 2. The Perfusion images reveal the left ventricle to be mildly dilated at 132 mL both in rest and stress images. There is a moderate-sized inferior and inferoapical scar with very mild peri-infarct ischemia noted especially towards the apex.  Left ventricle systolic function was moderate to severely depressed at 36%. 3. This is an intermediate risk study, clinical correlation recommended.    Coronary angiogram 11/04/2016: Native RCA small and diffusely diseased with distal 70-80% stenosis. Occluded SVG to RCA. Patent SVG to OM-RI, LIMA to LAD. Medical therapy.   Echo- 12/24/2017 1. Left ventricle cavity is normal in size. Moderate concentric hypertrophy of the left ventricle. Normal global echocardiogram wall motion. Doppler evidence of grade I (impaired) diastolic dysfunction. Calculated EF 54%. 2. Left atrial cavity is mildly dilated. 3. Trileaflet aortic valve. Mild aortic valve leaflet calcification. 4. Mild to moderate mitral regurgitation. Mild calcification function of the mitral valve annulus. Mild mitral valve leaflet patient later uptitrated to calcification. 5. Trace tricuspid regurgitation.   Lower Extremity Arterial Duplex 11/30/2019:  No hemodynamically significant stenosis in bilateral lower extremity above  the knee.  Moderate velocity increase at the right mid  posterior tibial artery.  suggests >50%stenosis.  Diffuse small vessel disease with moderately abnormal waveform right ankle  and severely abnormal waveform left ankle.  Non compressible vessels bilateral at the ankles suggests medial  calcinosis.    No significant change from 05/02/2019.  Peripheral arteriogram 01/03/2020: Distal abdominal aortogram with bifemoral arteriogram reveals widely patent iliac vessels and common femoral vessels.  Left SFA and left popliteal artery are widely patent with diffuse arterial sclerosis.  No luminal irregularity.  Below the left knee, two-vessel runoff in the form of PT and  peroneal artery.  There is diffuse disease noted in both the peroneal and PT.  AT is occluded. Similarly on the right no significant disease in the SFA and there is probably a two-vessel runoff in the form of peroneal artery and PT.  AT is occluded. Recommendation: Patient has very slow flow suggestive of microvascular disease and also small vessel disease.  Unless critical limb ischemia no reason for angioplasty, continue medical therapy.  50 mill contrast utilized.  PCV MYOCARDIAL PERFUSION WITH LEXISCAN 09/24/2020 Lexiscan nuclear stress test performed using 1-day protocol. SPECT images showed small sized, mild intensity, mildly reversible perfusion defect in apical to basal inferior myocardium. Stress LVEF 53%. Low risk study.  Carotid artery duplex 11/07/2020:  Stenosis in the right internal carotid artery (50-69%). The right PSV  internal/common carotid artery ratio of 5.65 is consistent with a stenosis  of >70%. Stenosis in the right external carotid artery (>50%).  Left internal carotid artery are consistent with stenosis in the range of  16-49%. Stenosis in the left common carotid artery (<50%). Stenosis in the  left external carotid artery (<50%).  There is heterogeneous plaque noted bilaterally.  Antegrade right vertebral artery flow. Antegrade left vertebral artery  flow.   Follow up in six months is appropriate if clinically indicated. Compared  to 04/06/2020, no significant change.   Echocardiogram 01/21/2021:  Left ventricle cavity is normal in size. Moderate concentric hypertrophy  of the left ventricle. Normal LV systolic function with EF 60%. Normal  global wall motion. Doppler evidence of grade I (impaired) diastolic  dysfunction, normal LAP.  Moderate (Grade II) mitral regurgitation.  Mild pulmonic regurgitation.  No evidence of pulmonary hypertension.  No significant change compared to previous study on 12/24/2017.   ABI 02/11/2021: Right PT monophasic and non-compressible, DP biphasic and noncompressible.  TBI 0.59.  Right great toe pressure 102 mmHg. Left PT monophasic and noncompressible, DP monophasic and noncompressible.  TBI 0.42.  Left great toe pressure 73 mmHg. Bilateral TBI abnormal. Bilateral TBI suggest appropriate pressure for wound healing.   Patient has had peripheral arteriogram on 01/03/2020 revealing widely patent large vessels and small vessel disease.  In the absence of open wounds or nonhealing foot ulcer and absence of rest pain, recommend medical therapy.  EKG  04/02/2021: Sinus rhythm with borderline first-degree AV block and single PVC at rate of 64 bpm. Left axis deviation, left anterior fascicular block.  Right bundle branch block.   EKG 10/02/2020: Sinus bradycardia at a rate of 54 bpm with borderline first-degree AV block. Left axis deviation, left anterior fascicular block.  Right bundle branch block.  Nonspecific T wave abnormality.  Compared to EKG 11/14/2019, bradycardia new.  EKG 11/14/2019: Normal sinus rhythm at 74 bpm with 1 PAC, left axis deviation, left anterior fasicular block, RBBB. Nonspecific T wave abnormality.  Assessment:     ICD-10-CM   1. Primary hypertension  I10 EKG 12-Lead    2. Claudication in peripheral vascular disease (HCC)  I73.9     3. Leg swelling  M79.89       Meds ordered this encounter   Medications   isosorbide dinitrate (ISORDIL) 30 MG tablet    Sig: Take 1 tablet (30 mg total) by mouth 3 (three) times daily.    Dispense:  90 tablet    Refill:  3   labetalol (NORMODYNE) 100 MG tablet    Sig: Take 2 tablets (200 mg total) by mouth 2 (two) times daily.    Dispense:  180 tablet  Refill:  3   Medications Discontinued During This Encounter  Medication Reason   HYDROcodone-acetaminophen (NORCO/VICODIN) 5-325 MG tablet Error   ciprofloxacin (CIPRO) 250 MG tablet Error   chlorthalidone (HYGROTON) 50 MG tablet Change in therapy   isosorbide mononitrate (IMDUR) 120 MG 24 hr tablet Change in therapy   labetalol (NORMODYNE) 100 MG tablet     Recommendations:   Tajon Moring  is a 87 y.o. male with history of CAD, S/P CABG in 2009, PAD, type II diabetes with autonomic insufficiency and autonomic orthostatic hypotension, asymptomatic carotid stenosis, and hyperlipidemia.  He has patent graft except occluded SVG to RCA which is diffusely diseased small vessel and also severe below-knee bilateral lower disease by angiography on 11/04/2016 and repeat peripheral arteriogram on 01/03/20 and has severe slow flow suggestive of microvascular disease.    Patient presents for 4-week follow-up of uncontrolled hypertension.  At last visit switch patient from metoprolol to labetalol 100 mg twice daily and enrolled him in remote patient monitoring with our office.  Recent lipid profile testing revealed lipids under excellent control.  Since last visit patient was seen in the emergency department multiple times from 03/08/2021 - 03/10/2021 for acute pyelonephritis and renal function deterioration.   Given recent renal function deterioration Will stop chlorthalidone at this time.  Have advised patient to schedule follow-up appointment with nephrology as well as PCP.  Patient is currently scheduled to see urologist regarding recurrent UTI and pyelonephritis on 04/04/2021.  As I am discontinuing  chlorthalidone Will increase labetalol from 100 mg twice daily to 200 mg twice daily.  We will also switch patient from isosorbide mononitrate once daily to isosorbide dinitrate 30 mg 3 times daily with hydralazine. We will also avoid calcium channel blockers as patient has not tolerated amlodipine in the past.   In regarded to PAD, in the absence of open wounds or nonhealing ulcers recommend continued medical management.  Patient verbalized understanding and agreement.  Can discussed at length with patient regarding diet and lifestyle modifications.  Also discussed regarding elevating his legs and avoiding salt intake to improve swelling, particularly at his he will be discontinuing chlorthalidone.  As patient will be seeing PCP and nephrology as well as urology in the next several weeks we will plan to follow-up in 3 months to reevaluate hypertension control.   Alethia Berthold, PA-C 04/02/2021, 11:41 AM Office: [phone]

## 2021-04-04 ENCOUNTER — Encounter (INDEPENDENT_AMBULATORY_CARE_PROVIDER_SITE_OTHER): Payer: Medicare HMO | Admitting: Ophthalmology

## 2021-04-04 DIAGNOSIS — R311 Benign essential microscopic hematuria: Secondary | ICD-10-CM | POA: Diagnosis not present

## 2021-04-04 DIAGNOSIS — N3281 Overactive bladder: Secondary | ICD-10-CM | POA: Diagnosis not present

## 2021-04-04 DIAGNOSIS — C61 Malignant neoplasm of prostate: Secondary | ICD-10-CM | POA: Diagnosis not present

## 2021-04-04 DIAGNOSIS — N3001 Acute cystitis with hematuria: Secondary | ICD-10-CM | POA: Diagnosis not present

## 2021-04-10 ENCOUNTER — Ambulatory Visit: Payer: Medicare HMO | Admitting: Student

## 2021-04-10 DIAGNOSIS — R1013 Epigastric pain: Secondary | ICD-10-CM | POA: Diagnosis not present

## 2021-04-10 DIAGNOSIS — E1142 Type 2 diabetes mellitus with diabetic polyneuropathy: Secondary | ICD-10-CM | POA: Diagnosis not present

## 2021-04-10 DIAGNOSIS — Z0001 Encounter for general adult medical examination with abnormal findings: Secondary | ICD-10-CM | POA: Diagnosis not present

## 2021-04-10 DIAGNOSIS — E782 Mixed hyperlipidemia: Secondary | ICD-10-CM | POA: Diagnosis not present

## 2021-04-10 DIAGNOSIS — I10 Essential (primary) hypertension: Secondary | ICD-10-CM | POA: Diagnosis not present

## 2021-04-10 DIAGNOSIS — N400 Enlarged prostate without lower urinary tract symptoms: Secondary | ICD-10-CM | POA: Diagnosis not present

## 2021-04-10 DIAGNOSIS — E1165 Type 2 diabetes mellitus with hyperglycemia: Secondary | ICD-10-CM | POA: Diagnosis not present

## 2021-04-16 DIAGNOSIS — N2581 Secondary hyperparathyroidism of renal origin: Secondary | ICD-10-CM | POA: Diagnosis not present

## 2021-04-16 DIAGNOSIS — D631 Anemia in chronic kidney disease: Secondary | ICD-10-CM | POA: Diagnosis not present

## 2021-04-16 DIAGNOSIS — I129 Hypertensive chronic kidney disease with stage 1 through stage 4 chronic kidney disease, or unspecified chronic kidney disease: Secondary | ICD-10-CM | POA: Diagnosis not present

## 2021-04-16 DIAGNOSIS — N183 Chronic kidney disease, stage 3 unspecified: Secondary | ICD-10-CM | POA: Diagnosis not present

## 2021-04-24 DIAGNOSIS — M47897 Other spondylosis, lumbosacral region: Secondary | ICD-10-CM | POA: Diagnosis not present

## 2021-04-24 DIAGNOSIS — E1142 Type 2 diabetes mellitus with diabetic polyneuropathy: Secondary | ICD-10-CM | POA: Diagnosis not present

## 2021-04-29 NOTE — Progress Notes (Signed)
[facility] Note  04/30/2021     CHIEF COMPLAINT Patient presents for Retina Follow Up   HISTORY OF PRESENT ILLNESS: Justin [name] is a 87 y.o. male who presents to the clinic today for:   HPI     Retina Follow Up   Patient presents with  Diabetic Retinopathy.  In both eyes.  Duration of 8 weeks.  Since onset it is stable.  I, the attending physician,  performed the HPI with the patient and updated documentation appropriately.        Comments   Pt here for 8 wk ret f/u NPDR OU. Pt states vision is the same, no changes. Blood sugar was 140 this morning. Most recent A1C was 8. No ocular pain or discomfort reported.       Last edited by [name], MD on 05/01/2021  3:10 PM.    Pt is delayed to follow up from 8 weeks to 11 due to being in the hospital a couple times since last visit with a kidney infection, he is also having trouble with his legs and feet with neuropathy, he feels like his vision is doing pretty good  Referring physician: Gevena Cotton, MD [address],  [place] [address]  HISTORICAL INFORMATION:   Selected notes from the [facility] Referred by Dr. [name] for concern of mac edema OU   CURRENT MEDICATIONS: Current Outpatient Medications (Ophthalmic Drugs)  Medication Sig   Polyvinyl Alcohol-Povidone (REFRESH OP) Place 1 drop into both eyes 3 (three) times daily as needed (dry eyes).   No current facility-administered medications for this visit. (Ophthalmic Drugs)   Current Outpatient Medications (Other)  Medication Sig   ACCU-CHEK SMARTVIEW test strip    Accu-Chek Softclix Lancets lancets    acetaminophen (TYLENOL) 500 MG tablet Take 1,000 mg by mouth 2 (two) times daily as needed for mild pain.   aspirin EC 81 MG tablet Take 81 mg by mouth daily.   Blood Glucose Monitoring Suppl (ACCU-CHEK GUIDE) w/Device KIT    cholecalciferol (VITAMIN D3) 25 MCG (1000 UNIT) tablet Take 1,000 Units  by mouth daily.   clonazePAM (KLONOPIN) 0.5 MG tablet Take 0.5 mg by mouth daily as needed for anxiety.   ezetimibe (ZETIA) 10 MG tablet TAKE 1 TABLET DAILY AFTER SUPPER   gabapentin (NEURONTIN) 300 MG capsule Take 300 mg by mouth 2 (two) times daily.   hydrALAZINE (APRESOLINE) 50 MG tablet Take 1 tablet (50 mg total) by mouth 3 (three) times daily.   isosorbide dinitrate (ISORDIL) 30 MG tablet Take 1 tablet (30 mg total) by mouth 3 (three) times daily.   JANUVIA 100 MG tablet Take 100 mg by mouth daily.    ketoconazole (NIZORAL) 2 % cream Apply 1 application topically 2 (two) times daily.   labetalol (NORMODYNE) 100 MG tablet Take 2 tablets (200 mg total) by mouth 2 (two) times daily.   LANTUS SOLOSTAR 100 UNIT/ML Solostar Pen Inject 15-50 Units into the skin See admin instructions. Inject 50 units in the morning and 15 units in the evening.   losartan (COZAAR) 25 MG tablet Take 1 tablet (25 mg total) by mouth daily.   omeprazole (PRILOSEC) 40 MG capsule Take 40 mg by mouth daily.    Pyridoxine HCl (B-6 PO) Take 1 capsule by mouth daily.   simvastatin (ZOCOR) 20 MG tablet Take 20 mg by mouth daily.   tamsulosin (FLOMAX) 0.4 MG CAPS capsule Take 0.4 mg by mouth  daily.   vitamin B-12 (CYANOCOBALAMIN) 500 MCG tablet Take 500 mcg by mouth daily.   vitamin E 180 MG (400 UNITS) capsule Take 400 Units by mouth daily.   XARELTO 2.5 MG TABS tablet TAKE 1 TABLET TWICE DAILY (Patient taking differently: Take 2.5 mg by mouth 2 (two) times daily.)   No current facility-administered medications for this visit. (Other)      REVIEW OF SYSTEMS: ROS   Positive for: Gastrointestinal, Genitourinary, Endocrine, Cardiovascular, Eyes Negative for: Constitutional, Neurological, Skin, Musculoskeletal, HENT, Respiratory, Psychiatric, Allergic/Imm, Heme/Lymph Last edited by Kingsley Spittle, COT on 04/30/2021  7:59 AM.        ALLERGIES Allergies  Allergen Reactions   Contrast Media [Iodinated [employer]] Itching    PT STATES ALLERGY TO "CT DYE".   Amlodipine Swelling    Leg swelling   Dye Fdc Red [Red Dye] Swelling    CAT scan dye   Ibuprofen Other (See Comments)    Upset stomach     PAST MEDICAL HISTORY Past Medical History:  Diagnosis Date   Anxiety    Arthritis    Carotid arterial disease ([place])    CKD (chronic kidney disease)    Coronary artery disease    Diabetes mellitus without complication (HCC)    Diabetic retinopathy ([place])    NPDR OU   Diverticulitis    Dyspnea    GERD (gastroesophageal reflux disease)    History of kidney stones 2021   Hypercholesteremia    Hypertension    Hypertensive retinopathy    OU   Myocardial infarction [facility]) 2009   Pneumonia    as a child   Prostate cancer ([place])    UTI (lower urinary tract infection)    Past Surgical History:  Procedure Laterality Date   ABDOMINAL SURGERY     for diverticulitis; also removed appendix   APPENDECTOMY     CARDIAC CATHETERIZATION  2018   CATARACT EXTRACTION     CATARACT EXTRACTION, BILATERAL     CHOLECYSTECTOMY N/A 10/12/2017   Procedure: LAPAROSCOPIC CHOLECYSTECTOMY;  Surgeon: [name], MD;  Location: [place];  Service: General;  Laterality: N/A;   CORONARY ARTERY BYPASS GRAFT  2009   ERCP N/A 12/21/2018   Procedure: ENDOSCOPIC RETROGRADE CHOLANGIOPANCREATOGRAPHY (ERCP);  Surgeon: [name], MD;  Location: [place];  Service: Endoscopy;  Laterality: N/A;   ESOPHAGOGASTRODUODENOSCOPY (EGD) WITH PROPOFOL N/A 12/17/2018   Procedure: ESOPHAGOGASTRODUODENOSCOPY (EGD) WITH PROPOFOL;  Surgeon: [name], MD;  Location: [place];  Service: Endoscopy;  Laterality: N/A;   EUS Left 12/17/2018   Procedure: UPPER ENDOSCOPIC ULTRASOUND (EUS) LINEAR;  Surgeon: [name], MD;  Location: [place];  Service: Endoscopy;  Laterality: Left;   EYE SURGERY     "for bleeding in eye"   HERNIA REPAIR     LEFT HEART CATH AND CORONARY ANGIOGRAPHY N/A 11/04/2016   Procedure: Left Heart Cath  and Coronary Angiography;  Surgeon: [name], MD;  Location: [place] [facility];  Service: Cardiovascular;  Laterality: N/A;   LOWER EXTREMITY ANGIOGRAPHY N/A 11/04/2016   Procedure: Lower Extremity Angiography;  Surgeon: [name], MD;  Location: [place] [facility];  Service: Cardiovascular;  Laterality: N/A;   LOWER EXTREMITY ANGIOGRAPHY N/A 01/03/2020   Procedure: LOWER EXTREMITY ANGIOGRAPHY;  Surgeon: [name], MD;  Location: [place] [facility];  Service: Cardiovascular;  Laterality: N/A;   LUMBAR LAMINECTOMY/DECOMPRESSION MICRODISCECTOMY Bilateral 11/09/2020   Procedure: Laminectomy and Foraminotomy - bilateral - Lumbar Four-Five.;  Surgeon: Earnie Larsson, MD;  [facility]  OR;  Service: Neurosurgery;  Laterality: Bilateral;  posterior   PROSTATE SURGERY     REMOVAL OF STONES  12/21/2018   Procedure: REMOVAL OF STONES;  Surgeon: [name], MD;  Location: [place];  Service: Endoscopy;;   SPHINCTEROTOMY  12/21/2018   Procedure: [name];  Surgeon: [name], MD;  Location: [facility] ENDOSCOPY;  Service: Endoscopy;;    FAMILY HISTORY Family History  Problem Relation Age of Onset   Diabetes Father    Diabetes Maternal Aunt    Diabetes Maternal Uncle    Diabetes Maternal Grandmother    Heart disease Sister    Diabetes Brother    Heart disease Sister     SOCIAL HISTORY Social History   Tobacco Use   Smoking status: Former    Packs/day: 0.25    Years: 2.00    Pack years: 0.50    Types: Cigarettes   Smokeless tobacco: Never   Tobacco comments:    when he was a teenage age [age]  Vaping Use   Vaping Use: Never used  Substance Use Topics   Alcohol use: No   Drug use: No         OPHTHALMIC EXAM:  Base Eye Exam     Visual Acuity (Snellen - Linear)       Right Left   Dist cc 20/30 20/40   Dist ph cc NI 20/25    Correction: Glasses         Tonometry (Tonopen, 8:05 AM)       Right Left   Pressure 17 10         Pupils       Dark Light Shape React  APD   Right 2 1 Round Minimal None   Left 2 1 Round Minimal None         Visual Fields (Counting fingers)       Left Right    Full Full         Extraocular Movement       Right Left    Full, Ortho Full, Ortho         Neuro/Psych     Oriented x3: Yes   Mood/Affect: Normal         Dilation     Both eyes: 1.0% Mydriacyl, 2.5% Phenylephrine @ 8:05 AM           Slit Lamp and Fundus Exam     Slit Lamp Exam       Right Left   Lids/Lashes Dermatochalasis - upper lid, Meibomian gland dysfunction Dermatochalasis - upper lid, Meibomian gland dysfunction   Conjunctiva/Sclera Melanosis Melanosis   Cornea Arcus, trace Punctate epithelial erosions, mild endopigment Arcus, 1-2+ Punctate epithelial erosions, trace endopigment, mild corneal haze   Anterior Chamber Deep and quiet Deep and quiet   Iris Mild Temporal Iris atrophy, Round and dilated, No NVI Mild Temporal Iris atrophy, Round and dilated, No NVI   Lens PC IOL in good position with mild aterior capsule fimosis PC IOL in good position with mild aterior capsule fimosis   Vitreous Vitreous syneresis, Posterior vitreous detachment, vitreous condensations Vitreous syneresis, Posterior vitreous detachment, vitreous condensations         Fundus Exam       Right Left   Disc pallor with sharp rim, Peripapillary atrophy, Compact mild pallor, sharp rim, Tilted disc, mild temporal Peripapillary atrophy, Compact   C/D Ratio 0.2 0.3   Macula Blunted foveal reflex, Retinal pigment epithelial mottling and clumping, Microaneurysms -- improved, persistent  central cystic changes -- slightly improved Blunted foveal reflex, focal area of RPE atrophy SN to fovea, Microaneurysms, Retinal pigment epithelial mottling, refractile Epiretinal membrane, central cystic changes - stably improved   Vessels Vascular attenuation, Tortuous, AV crossing changes, mild Copper wiring attenuated, Tortuous   Periphery Attached, rare, scattered MA, good  peripheral PRP 360 - room for posterior fill in if needed Attached, rare, scattered MA, Peripheral PRP 360 -- room for posterior fill in if needed           Refraction     Wearing Rx       Sphere Cylinder Axis Add   Right -0.75 +1.75 175 +3.00   Left -0.25 +1.50 180 +3.00    Type: Bifocal            IMAGING AND PROCEDURES  Imaging and Procedures for _0 @  OCT, Retina - OU - Both Eyes       Right Eye Quality was good. Central Foveal Thickness: 329. Progression has improved. Findings include abnormal foveal contour, intraretinal fluid, no SRF, retinal drusen , outer retinal atrophy (Mild interval improvement in central cystic changes).   Left Eye Quality was good. Central Foveal Thickness: 250. Progression has been stable. Findings include no SRF, outer retinal atrophy, retinal drusen , no IRF, normal foveal contour (Trace Persistent cystic changes -- slightly inreased).   Notes *Images captured and stored on drive  Diagnosis / Impression:  DME OU, OD>OS OD: Mild interval improvement in central cystic changes OS: tr persistent cystic changes  Clinical management:  See below  Abbreviations: NFP - Normal foveal profile. CME - cystoid macular edema. PED - pigment epithelial detachment. IRF - intraretinal fluid. SRF - subretinal fluid. EZ - ellipsoid zone. ERM - epiretinal membrane. ORA - outer retinal atrophy. ORT - outer retinal tubulation. SRHM - subretinal hyper-reflective material       Intravitreal Injection, Pharmacologic Agent - OD - Right Eye       Time Out 04/30/2021. 8:24 AM. Confirmed correct patient, procedure, site, and patient consented.   Anesthesia Topical anesthesia was used. Anesthetic medications included Lidocaine 2%, Proparacaine 0.5%.   Procedure Preparation included 5% betadine to ocular surface, eyelid speculum. A (32g) needle was used.   Injection: 2 mg aflibercept 2 MG/0.05ML   Route: Intravitreal, Site: Right Eye   NDC:  [id number], Lot: [id number], Expiration date: 01/26/2022, Waste: 0.05 mL   Post-op Post injection exam found visual acuity of at least counting fingers. The patient tolerated the procedure well. There were no complications. The patient received written and verbal post procedure care education. Post injection medications were not given.            ASSESSMENT/PLAN:    ICD-10-CM   1. Moderate nonproliferative diabetic retinopathy of both eyes with macular edema associated with type 2 diabetes mellitus (HCC)  Q11.9417 Intravitreal Injection, Pharmacologic Agent - OD - Right Eye    aflibercept (EYLEA) SOLN 2 mg    2. Retinal edema  H35.81 OCT, Retina - OU - Both Eyes    3. Essential hypertension  I10     4. Hypertensive retinopathy of both eyes  H35.033     5. Pseudophakia of both eyes  Z96.1       1,2. Moderate non-proliferative diabetic retinopathy with edema OU   - moved to [place] from [place], [place] -- history of prior injections OS with Dr. [name]  - records received and reviewed from [facility] (Dr. [name])  - history of focal laser  OS x2 and IVK/IVT OU in [place] -- last injection was IVK OS November 2013  - initial exam: scattered IRH, DBH and +macular edema  - initial OCT: diabetic macular edema, both eyes, (OD > OS)  - FA (10.1.19) showed late leaking MA OU -- no NV  - delayed f/u on 4.16.20 due to hospitalization for sepsis / gallstones  - S/P PRP OD (07.14.20) - good laser surrounding, room for posterior fill in if needed  - S/P PRP OS (08.26.20)  - S/P IVA OD #1 (09.17.19), #2 (10.29.19), #3 (11.26.19), #4 (01.02.20), #5 (01.30.20), #6 (02.28.20), #7 (04.16.20), #8 (06.11.20), #9 (08.12.20), #10 (01.11.21)  - S/P IVA OS #1 (10.01.19), #2 (10.29.19), #3 (11.26.19), #4 (01.02.20), #5 (01.30.20), #6 (02.28.20), #7 (04.16.20), #8 (05.14.20), #9 (06.11.20), #10 (07.09.20), #11 (08.12.20), #12 (01.11.21)  - S/P IVK OD #1 (05.14.20) -- no significant  improvement  - switched back to Avastin OD (#9 6.11.2020)  - repeated Eylea4U benefits investigation due to possible IVA resistance (8.12.20)  - S/P IVE OS #1 (09.16.20), #2 (10.16.20), #3 (11.13.20), #4 (12.11.20), #5 (02.08.21), #6 (03.12.21), #7 (04.16.21), #8 (05.14.21), #9 (06.18.21), #10 (07.23.21), #11 (8.30.21), #12 (10.05.21), #13 (11.10.21), #14 (12.15.21), #15 (1.18.22), #16 (03.17.22)  - S/P IVE OD #1 (09.16.20), #2 (10.16.20), #3 (11.13.20), #4 (12.11.20), #5 (02.08.21), #6 (03.12.21), #7 (04.16.21), #8 (05.14.21), #9 (06.18.21), #10 (07.23.21), #11 (08.30.21), #12 (10.05.21), #13 (11.10.21), #14 (12.15.21), #15 (01.18.22), #16 (03.17.22), #17 (5.12.22)  - OCT today shows OD: interval improvement in IRF; OS: tr persistent cystic changes (OD at 11 weeks; OS at 19 weeks)  - BCVA stable at 20/30 OD, 20/25 OS  - recommend IVE OD #18 only today, 8.2.22 w/ f/u in 10 wks -- will hold off on OS  - pt in agreement  - Eylea4U benefits investigation completed and verified for 2022 as of 01.18.22  - RBA of procedure discussed, questions answered   - informed consent obtained  - see procedure note  - Eylea informed consent form signed and scanned on 12.11.2020  - f/u 10 weeks, DFE, OCT, possible injection  3,4. Hypertensive retinopathy OU  - discussed importance of tight BP control  - monitor  5. Pseudophakia OU  - s/p CE/IOL OU in [place], [place]  - beautiful surgeries, doing well  - monitor  Ophthalmic Meds Ordered this visit:  Meds ordered this encounter  Medications   aflibercept (EYLEA) SOLN 2 mg       Return in about 10 weeks (around 07/09/2021) for f/u NPDR OU, DFE, OCT.  There are no Patient Instructions on file for this visit.  This document serves as a record of services personally performed by Gardiner Sleeper, MD, PhD. It was created on their behalf by Estill Bakes, COT an ophthalmic technician. The creation of this record is the provider's dictation and/or activities  during the visit.    Electronically signed by: Estill Bakes, COT 8.1.22 @ 3:15 PM   This document serves as a record of services personally performed by Gardiner Sleeper, MD, PhD. It was created on their behalf by San Jetty. Owens Shark, OA an ophthalmic technician. The creation of this record is the provider's dictation and/or activities during the visit.    Electronically signed by: San Jetty. Owens Shark, [place] 08.02.2022 3:15 PM  Gardiner Sleeper, M.D., Ph.D. Diseases & Surgery of the Retina and Vitreous [facility]  I have reviewed the above documentation for accuracy and completeness, and I agree with the above. Sharyon Cable  Coralyn Pear, M.D., Ph.D. 05/01/21 3:15 PM   Abbreviations: M myopia (nearsighted); A astigmatism; H hyperopia (farsighted); P presbyopia; Mrx spectacle prescription;  CTL contact lenses; OD right eye; OS left eye; OU both eyes  XT exotropia; ET esotropia; PEK punctate epithelial keratitis; PEE punctate epithelial erosions; DES dry eye syndrome; MGD meibomian gland dysfunction; ATs artificial tears; PFAT's preservative free artificial tears; [place] nuclear sclerotic cataract; PSC posterior subcapsular cataract; ERM epi-retinal membrane; PVD posterior vitreous detachment; RD retinal detachment; DM diabetes mellitus; DR diabetic retinopathy; NPDR non-proliferative diabetic retinopathy; PDR proliferative diabetic retinopathy; CSME clinically significant macular edema; DME diabetic macular edema; dbh dot blot hemorrhages; CWS cotton wool spot; POAG primary open angle glaucoma; C/D cup-to-disc ratio; HVF humphrey visual field; GVF goldmann visual field; OCT optical coherence tomography; IOP intraocular pressure; BRVO Branch retinal vein occlusion; CRVO central retinal vein occlusion; CRAO central retinal artery occlusion; BRAO branch retinal artery occlusion; RT retinal tear; SB scleral buckle; PPV pars plana vitrectomy; VH Vitreous hemorrhage; PRP panretinal laser photocoagulation; IVK  intravitreal kenalog; VMT vitreomacular traction; MH Macular hole;  NVD neovascularization of the disc; NVE neovascularization elsewhere; AREDS age related eye disease study; ARMD age related macular degeneration; POAG primary open angle glaucoma; EBMD epithelial/anterior basement membrane dystrophy; ACIOL anterior chamber intraocular lens; IOL intraocular lens; PCIOL posterior chamber intraocular lens; Phaco/IOL phacoemulsification with intraocular lens placement; [place] photorefractive keratectomy; LASIK laser assisted in situ keratomileusis; HTN hypertension; DM diabetes mellitus; COPD chronic obstructive pulmonary disease

## 2021-04-30 ENCOUNTER — Ambulatory Visit (INDEPENDENT_AMBULATORY_CARE_PROVIDER_SITE_OTHER): Payer: Medicare HMO | Admitting: Ophthalmology

## 2021-04-30 ENCOUNTER — Encounter (INDEPENDENT_AMBULATORY_CARE_PROVIDER_SITE_OTHER): Payer: Self-pay | Admitting: Ophthalmology

## 2021-04-30 ENCOUNTER — Other Ambulatory Visit: Payer: Self-pay

## 2021-04-30 DIAGNOSIS — Z961 Presence of intraocular lens: Secondary | ICD-10-CM | POA: Diagnosis not present

## 2021-04-30 DIAGNOSIS — I10 Essential (primary) hypertension: Secondary | ICD-10-CM

## 2021-04-30 DIAGNOSIS — E113313 Type 2 diabetes mellitus with moderate nonproliferative diabetic retinopathy with macular edema, bilateral: Secondary | ICD-10-CM

## 2021-04-30 DIAGNOSIS — H35033 Hypertensive retinopathy, bilateral: Secondary | ICD-10-CM

## 2021-04-30 DIAGNOSIS — H3581 Retinal edema: Secondary | ICD-10-CM | POA: Diagnosis not present

## 2021-05-01 ENCOUNTER — Encounter (INDEPENDENT_AMBULATORY_CARE_PROVIDER_SITE_OTHER): Payer: Self-pay | Admitting: Ophthalmology

## 2021-05-01 DIAGNOSIS — E1165 Type 2 diabetes mellitus with hyperglycemia: Secondary | ICD-10-CM | POA: Diagnosis not present

## 2021-05-01 DIAGNOSIS — E782 Mixed hyperlipidemia: Secondary | ICD-10-CM | POA: Diagnosis not present

## 2021-05-01 DIAGNOSIS — I10 Essential (primary) hypertension: Secondary | ICD-10-CM | POA: Diagnosis not present

## 2021-05-01 DIAGNOSIS — E1142 Type 2 diabetes mellitus with diabetic polyneuropathy: Secondary | ICD-10-CM | POA: Diagnosis not present

## 2021-05-01 DIAGNOSIS — R1013 Epigastric pain: Secondary | ICD-10-CM | POA: Diagnosis not present

## 2021-05-01 MED ORDER — AFLIBERCEPT 2MG/0.05ML IZ SOLN FOR KALEIDOSCOPE
2.0000 mg | INTRAVITREAL | Status: AC | PRN
  Administered 2021-04-30: 2 mg via INTRAVITREAL

## 2021-05-03 DIAGNOSIS — M48061 Spinal stenosis, lumbar region without neurogenic claudication: Secondary | ICD-10-CM | POA: Diagnosis not present

## 2021-05-03 DIAGNOSIS — M4316 Spondylolisthesis, lumbar region: Secondary | ICD-10-CM | POA: Diagnosis not present

## 2021-05-03 DIAGNOSIS — I10 Essential (primary) hypertension: Secondary | ICD-10-CM | POA: Diagnosis not present

## 2021-05-03 DIAGNOSIS — M7138 Other bursal cyst, other site: Secondary | ICD-10-CM | POA: Diagnosis not present

## 2021-05-03 DIAGNOSIS — M5126 Other intervertebral disc displacement, lumbar region: Secondary | ICD-10-CM | POA: Diagnosis not present

## 2021-05-14 ENCOUNTER — Other Ambulatory Visit: Payer: Medicare HMO

## 2021-05-14 ENCOUNTER — Ambulatory Visit: Payer: Medicare HMO

## 2021-05-14 ENCOUNTER — Other Ambulatory Visit: Payer: Self-pay

## 2021-05-14 DIAGNOSIS — I6523 Occlusion and stenosis of bilateral carotid arteries: Secondary | ICD-10-CM | POA: Diagnosis not present

## 2021-05-23 DIAGNOSIS — I739 Peripheral vascular disease, unspecified: Secondary | ICD-10-CM | POA: Diagnosis not present

## 2021-05-23 DIAGNOSIS — L84 Corns and callosities: Secondary | ICD-10-CM | POA: Diagnosis not present

## 2021-05-23 DIAGNOSIS — L602 Onychogryphosis: Secondary | ICD-10-CM | POA: Diagnosis not present

## 2021-05-24 DIAGNOSIS — M47897 Other spondylosis, lumbosacral region: Secondary | ICD-10-CM | POA: Diagnosis not present

## 2021-05-24 DIAGNOSIS — E1142 Type 2 diabetes mellitus with diabetic polyneuropathy: Secondary | ICD-10-CM | POA: Diagnosis not present

## 2021-06-03 DIAGNOSIS — I10 Essential (primary) hypertension: Secondary | ICD-10-CM | POA: Diagnosis not present

## 2021-06-04 ENCOUNTER — Other Ambulatory Visit: Payer: Self-pay | Admitting: Student

## 2021-06-05 DIAGNOSIS — K219 Gastro-esophageal reflux disease without esophagitis: Secondary | ICD-10-CM | POA: Diagnosis not present

## 2021-06-07 DIAGNOSIS — N183 Chronic kidney disease, stage 3 unspecified: Secondary | ICD-10-CM | POA: Diagnosis not present

## 2021-06-10 ENCOUNTER — Other Ambulatory Visit: Payer: Self-pay

## 2021-06-10 ENCOUNTER — Other Ambulatory Visit: Payer: Self-pay | Admitting: Cardiology

## 2021-06-10 ENCOUNTER — Telehealth: Payer: Self-pay | Admitting: Student

## 2021-06-10 NOTE — Telephone Encounter (Signed)
Please obtain blood pressure readings from home monitoring and let me know.

## 2021-06-10 NOTE — Telephone Encounter (Signed)
Pt says he needs new prescription for hydralazine; says current prescription says take 1 tablet 3x per day, but he was told by [facility] to take 2 tablets 3x per day.

## 2021-06-12 DIAGNOSIS — M48062 Spinal stenosis, lumbar region with neurogenic claudication: Secondary | ICD-10-CM | POA: Diagnosis not present

## 2021-06-14 ENCOUNTER — Ambulatory Visit (HOSPITAL_COMMUNITY): Admission: EM | Admit: 2021-06-14 | Discharge: 2021-06-14 | Payer: Medicare HMO

## 2021-06-14 ENCOUNTER — Observation Stay (HOSPITAL_COMMUNITY)
Admission: EM | Admit: 2021-06-14 | Discharge: 2021-06-16 | Disposition: A | Discharge status: Home / Self Care | Payer: Medicare HMO | Attending: Internal Medicine | Admitting: Internal Medicine

## 2021-06-14 ENCOUNTER — Emergency Department (HOSPITAL_COMMUNITY): Payer: Medicare HMO

## 2021-06-14 ENCOUNTER — Other Ambulatory Visit: Payer: Self-pay

## 2021-06-14 ENCOUNTER — Encounter (HOSPITAL_COMMUNITY): Payer: Self-pay

## 2021-06-14 ENCOUNTER — Encounter (HOSPITAL_COMMUNITY): Payer: Self-pay | Admitting: Emergency Medicine

## 2021-06-14 DIAGNOSIS — R0602 Shortness of breath: Secondary | ICD-10-CM | POA: Diagnosis not present

## 2021-06-14 DIAGNOSIS — I6523 Occlusion and stenosis of bilateral carotid arteries: Secondary | ICD-10-CM | POA: Diagnosis not present

## 2021-06-14 DIAGNOSIS — I4510 Unspecified right bundle-branch block: Secondary | ICD-10-CM | POA: Diagnosis not present

## 2021-06-14 DIAGNOSIS — E1122 Type 2 diabetes mellitus with diabetic chronic kidney disease: Secondary | ICD-10-CM | POA: Insufficient documentation

## 2021-06-14 DIAGNOSIS — E1159 Type 2 diabetes mellitus with other circulatory complications: Secondary | ICD-10-CM | POA: Diagnosis not present

## 2021-06-14 DIAGNOSIS — I252 Old myocardial infarction: Secondary | ICD-10-CM

## 2021-06-14 DIAGNOSIS — R55 Syncope and collapse: Secondary | ICD-10-CM | POA: Diagnosis not present

## 2021-06-14 DIAGNOSIS — I2510 Atherosclerotic heart disease of native coronary artery without angina pectoris: Secondary | ICD-10-CM

## 2021-06-14 DIAGNOSIS — I959 Hypotension, unspecified: Secondary | ICD-10-CM

## 2021-06-14 DIAGNOSIS — G909 Disorder of the autonomic nervous system, unspecified: Secondary | ICD-10-CM | POA: Diagnosis not present

## 2021-06-14 DIAGNOSIS — Z7901 Long term (current) use of anticoagulants: Secondary | ICD-10-CM | POA: Insufficient documentation

## 2021-06-14 DIAGNOSIS — Z79899 Other long term (current) drug therapy: Secondary | ICD-10-CM | POA: Insufficient documentation

## 2021-06-14 DIAGNOSIS — I129 Hypertensive chronic kidney disease with stage 1 through stage 4 chronic kidney disease, or unspecified chronic kidney disease: Secondary | ICD-10-CM | POA: Insufficient documentation

## 2021-06-14 DIAGNOSIS — R931 Abnormal findings on diagnostic imaging of heart and coronary circulation: Secondary | ICD-10-CM

## 2021-06-14 DIAGNOSIS — E1140 Type 2 diabetes mellitus with diabetic neuropathy, unspecified: Secondary | ICD-10-CM | POA: Diagnosis not present

## 2021-06-14 DIAGNOSIS — Z20822 Contact with and (suspected) exposure to covid-19: Secondary | ICD-10-CM | POA: Diagnosis not present

## 2021-06-14 DIAGNOSIS — Z23 Encounter for immunization: Secondary | ICD-10-CM | POA: Diagnosis not present

## 2021-06-14 DIAGNOSIS — I10 Essential (primary) hypertension: Secondary | ICD-10-CM | POA: Diagnosis present

## 2021-06-14 DIAGNOSIS — R42 Dizziness and giddiness: Secondary | ICD-10-CM | POA: Diagnosis not present

## 2021-06-14 DIAGNOSIS — N183 Chronic kidney disease, stage 3 unspecified: Secondary | ICD-10-CM | POA: Insufficient documentation

## 2021-06-14 DIAGNOSIS — Z951 Presence of aortocoronary bypass graft: Secondary | ICD-10-CM | POA: Diagnosis not present

## 2021-06-14 DIAGNOSIS — Z8546 Personal history of malignant neoplasm of prostate: Secondary | ICD-10-CM | POA: Insufficient documentation

## 2021-06-14 DIAGNOSIS — I517 Cardiomegaly: Secondary | ICD-10-CM | POA: Diagnosis not present

## 2021-06-14 DIAGNOSIS — Z87891 Personal history of nicotine dependence: Secondary | ICD-10-CM | POA: Diagnosis not present

## 2021-06-14 DIAGNOSIS — R29818 Other symptoms and signs involving the nervous system: Secondary | ICD-10-CM | POA: Diagnosis not present

## 2021-06-14 DIAGNOSIS — K219 Gastro-esophageal reflux disease without esophagitis: Secondary | ICD-10-CM | POA: Diagnosis present

## 2021-06-14 DIAGNOSIS — R001 Bradycardia, unspecified: Secondary | ICD-10-CM

## 2021-06-14 DIAGNOSIS — Z7982 Long term (current) use of aspirin: Secondary | ICD-10-CM | POA: Diagnosis not present

## 2021-06-14 LAB — CBC WITH DIFFERENTIAL/PLATELET
Abs Immature Granulocytes: 0.07 10*3/uL (ref 0.00–0.07)
Basophils Absolute: 0 10*3/uL (ref 0.0–0.1)
Basophils Relative: 1 %
Eosinophils Absolute: 0.1 10*3/uL (ref 0.0–0.5)
Eosinophils Relative: 1 %
HCT: 34.5 % — ABNORMAL LOW (ref 39.0–52.0)
Hemoglobin: 11 g/dL — ABNORMAL LOW (ref 13.0–17.0)
Immature Granulocytes: 1 %
Lymphocytes Relative: 14 %
Lymphs Abs: 1.2 10*3/uL (ref 0.7–4.0)
MCH: 28.6 pg (ref 26.0–34.0)
MCHC: 31.9 g/dL (ref 30.0–36.0)
MCV: 89.8 fL (ref 80.0–100.0)
Monocytes Absolute: 0.7 10*3/uL (ref 0.1–1.0)
Monocytes Relative: 9 %
Neutro Abs: 6.5 10*3/uL (ref 1.7–7.7)
Neutrophils Relative %: 74 %
Platelets: 198 10*3/uL (ref 150–400)
RBC: 3.84 MIL/uL — ABNORMAL LOW (ref 4.22–5.81)
RDW: 14.3 % (ref 11.5–15.5)
WBC: 8.6 10*3/uL (ref 4.0–10.5)
nRBC: 0 % (ref 0.0–0.2)

## 2021-06-14 LAB — COMPREHENSIVE METABOLIC PANEL
ALT: 14 U/L (ref 0–44)
AST: 15 U/L (ref 15–41)
Albumin: 3.4 g/dL — ABNORMAL LOW (ref 3.5–5.0)
Alkaline Phosphatase: 56 U/L (ref 38–126)
Anion gap: 9 (ref 5–15)
BUN: 38 mg/dL — ABNORMAL HIGH (ref 8–23)
CO2: 19 mmol/L — ABNORMAL LOW (ref 22–32)
Calcium: 9.2 mg/dL (ref 8.9–10.3)
Chloride: 108 mmol/L (ref 98–111)
Creatinine, Ser: 1.91 mg/dL — ABNORMAL HIGH (ref 0.61–1.24)
GFR, Estimated: 34 mL/min — ABNORMAL LOW (ref >60)
Glucose, Bld: 216 mg/dL — ABNORMAL HIGH (ref 70–99)
Potassium: 4.3 mmol/L (ref 3.5–5.1)
Sodium: 136 mmol/L (ref 135–145)
Total Bilirubin: 0.4 mg/dL (ref 0.3–1.2)
Total Protein: 6.7 g/dL (ref 6.5–8.1)

## 2021-06-14 LAB — CBG MONITORING, ED: Glucose-Capillary: 168 mg/dL — ABNORMAL HIGH (ref 70–99)

## 2021-06-14 LAB — TROPONIN I (HIGH SENSITIVITY)
Troponin I (High Sensitivity): 8 ng/L (ref <18)
Troponin I (High Sensitivity): 10 ng/L (ref <18)

## 2021-06-14 MED ORDER — LORAZEPAM 2 MG/ML IJ SOLN
1.0000 mg | Freq: Once | INTRAMUSCULAR | Status: AC
  Administered 2021-06-14: 1 mg via INTRAVENOUS
  Filled 2021-06-14: qty 1

## 2021-06-14 NOTE — ED Triage Notes (Signed)
Pt reports for a couple months has intermittent dizziness, ears ringing and popping and trouble with vision. Reports symptoms are more frequent in the past couple weeks.

## 2021-06-14 NOTE — ED Triage Notes (Signed)
Pt reports intermittent dizziness for the past year, pt sent by UC for further eval. Pt also reports some sob but denies any chest pain.

## 2021-06-14 NOTE — ED Provider Notes (Signed)
Emergency Medicine Provider Triage Evaluation Note  Justin [name] , a 87 y.o. male  was evaluated in triage.  Pt complains of dizziness and shortness of breath.  The symptoms have been going on for many months but worsened about a week ago.  States he is having vertiginous symptoms for the last week.  Was seen at urgent care earlier today and sent to the ED for additional evaluation.  Shortness of breath is worse with exertion.  No associated chest pain.  Extensive cardiac history..  Review of Systems  Positive: Dizziness, shortness of breath Negative: Nausea, vomiting  Physical Exam  BP 133/65 (BP Location: Left Arm)   Pulse 65   Temp 97.8 F (36.6 C) (Oral)   Resp 16   Ht 5' 7.5" (1.715 m)   Wt 81.6 kg   SpO2 100%   BMI 27.78 kg/m  Gen:   Awake, no distress   Resp:  Normal effort  MSK:   Moves extremities without difficulty  Other:  No nystagmus, cranial nerves III through XII grossly intact.  Grip strength equal bilaterally.  Medical Decision Making  Medically screening exam initiated at 1:16 PM.  Appropriate orders placed.  Jonetta Osgood was informed that the remainder of the evaluation will be completed by another provider, this initial triage assessment does not replace that evaluation, and the importance of remaining in the ED until their evaluation is complete.  Spoke with Dr. Francia Greaves about this patient.  We will do cardiac work-up given extensive history and worsening shortness of breath, will also get CT head given his vertigo symptoms.  Been greater than 12 hours, not a code stroke.   Sherrill Raring, PA-C 06/14/21 1317    Valarie Merino, MD 06/15/21 1806

## 2021-06-14 NOTE — ED Provider Notes (Signed)
Justin [name]    CSN: [id number] Arrival date & time: 06/14/21  0935      History   Chief Complaint Chief Complaint  Patient presents with   Dizziness   Tinnitus   Headache    HPI Justin [name] is a 87 y.o. male.   On arrival, patient complained of episodes of dizziness accompanied by shortness of breath and palpitations.  Patient states the dizziness is not new, states he has had it for many months, patient is currently established with cardiology, neurology, patient reports recently having surgery on his lumbar spine.  Patient states the neurologist has not been evaluating him for his dizziness as of yet, states he has an appointment coming up in 1 month.  EMR reviewed by me, patient has a history of small vessel coronary artery disease status post CABG 2009, bilateral carotid artery stenosis, right greater than left as well as uncontrolled type 2 diabetes and peripheral neuropathy.  Patient states he is also noticed that his ears have been popping more frequently, per my observation patient also suffering from hearing loss although he states he is not aware of this.  Patient is in a wheelchair during our visit today, he has a cane with him but states that he is unable to walk which is why he is in the wheelchair.  Patient is accompanied by his daughter today.  Patient states that episodes of dizziness resolve with rest.  Patient states that turning his head side to side makes it worse.  Patient denies nausea vomiting, upper respiratory illness.  Patient states he is also experiencing a "bad" headache that is constant, has been present for about 3 days, is global.  EKG performed in triage this morning demonstrates known right bundle branch block with worsening left axis deviation as well as irregular rhythm with premature atrial complexes.  Compared with previous EKGs which also demonstrated a left anterior fascicular block, it is difficult to tease out whether that is present on  this EKG today given worsening left axis deviation.  The history is provided by the patient.  Dizziness Headache  Past Medical History:  Diagnosis Date   Anxiety    Arthritis    Carotid arterial disease ([place])    CKD (chronic kidney disease)    Coronary artery disease    Diabetes mellitus without complication (HCC)    Diabetic retinopathy ([place])    NPDR OU   Diverticulitis    Dyspnea    GERD (gastroesophageal reflux disease)    History of kidney stones 2021   Hypercholesteremia    Hypertension    Hypertensive retinopathy    OU   Myocardial infarction [facility]) 2009   Pneumonia    as a child   Prostate cancer ([place])    UTI (lower urinary tract infection)     Patient Active Problem List   Diagnosis Date Noted   Lumbar stenosis with neurogenic claudication 11/09/2020   CKD stage 3 due to type 2 diabetes mellitus ([place]) 12/14/2019   Asymptomatic bilateral carotid artery stenosis 12/14/2019   Abdominal distention    CAD (coronary artery disease) 12/14/2018   Acute renal failure superimposed on stage 3 chronic kidney disease ([place]) 12/14/2018   UTI (urinary tract infection) 12/14/2018   HLD (hyperlipidemia) 12/14/2018   Sepsis ([place]) 12/14/2018   Abnormal LFTs    S/P CABG (coronary artery bypass graft) 12/06/2018   Asymptomatic stenosis of right carotid artery 12/06/2018   Claudication in peripheral vascular disease ([place]) 12/06/2018   Orthostatic  hypotension 12/06/2018   Elevated liver enzymes 12/06/2018   Abdominal pain 10/12/2018   Abnormal liver function tests 10/12/2018   Foreign body in stomach 10/12/2018   Primary hypertension    GERD (gastroesophageal reflux disease)    Diabetes mellitus without complication ([place])    Atherosclerosis of native coronary artery of native heart without angina pectoris     Past Surgical History:  Procedure Laterality Date   ABDOMINAL SURGERY     for diverticulitis; also removed appendix   APPENDECTOMY     CARDIAC CATHETERIZATION  2018    CATARACT EXTRACTION     CATARACT EXTRACTION, BILATERAL     CHOLECYSTECTOMY N/A 10/12/2017   Procedure: LAPAROSCOPIC CHOLECYSTECTOMY;  Surgeon: [name], MD;  Location: [place];  Service: General;  Laterality: N/A;   CORONARY ARTERY BYPASS GRAFT  2009   ERCP N/A 12/21/2018   Procedure: ENDOSCOPIC RETROGRADE CHOLANGIOPANCREATOGRAPHY (ERCP);  Surgeon: [name], MD;  Location: [place];  Service: Endoscopy;  Laterality: N/A;   ESOPHAGOGASTRODUODENOSCOPY (EGD) WITH PROPOFOL N/A 12/17/2018   Procedure: ESOPHAGOGASTRODUODENOSCOPY (EGD) WITH PROPOFOL;  Surgeon: [name], MD;  Location: [place];  Service: Endoscopy;  Laterality: N/A;   EUS Left 12/17/2018   Procedure: UPPER ENDOSCOPIC ULTRASOUND (EUS) LINEAR;  Surgeon: [name], MD;  Location: [place];  Service: Endoscopy;  Laterality: Left;   EYE SURGERY     "for bleeding in eye"   HERNIA REPAIR     LEFT HEART CATH AND CORONARY ANGIOGRAPHY N/A 11/04/2016   Procedure: Left Heart Cath and Coronary Angiography;  Surgeon: [name], MD;  Location: [place] [facility];  Service: Cardiovascular;  Laterality: N/A;   LOWER EXTREMITY ANGIOGRAPHY N/A 11/04/2016   Procedure: Lower Extremity Angiography;  Surgeon: [name], MD;  Location: [place] [facility];  Service: Cardiovascular;  Laterality: N/A;   LOWER EXTREMITY ANGIOGRAPHY N/A 01/03/2020   Procedure: LOWER EXTREMITY ANGIOGRAPHY;  Surgeon: [name], MD;  Location: [place] [facility];  Service: Cardiovascular;  Laterality: N/A;   LUMBAR LAMINECTOMY/DECOMPRESSION MICRODISCECTOMY Bilateral 11/09/2020   Procedure: Laminectomy and Foraminotomy - bilateral - Lumbar Four-Five.;  Surgeon: Earnie Larsson, MD;  Location: [place];  Service: Neurosurgery;  Laterality: Bilateral;  posterior   PROSTATE SURGERY     REMOVAL OF STONES  12/21/2018   Procedure: REMOVAL OF STONES;  Surgeon: [name], MD;  Location[facility] ENDOSCOPY;  Service: Endoscopy;;   SPHINCTEROTOMY  12/21/2018   Procedure:  [name];  Surgeon: [name], MD;  Location: [facility] ENDOSCOPY;  Service: Endoscopy;;       Home Medications    Prior to Admission medications   Medication Sig Start Date End Date Taking? Authorizing Provider  ACCU-CHEK SMARTVIEW test strip  03/27/19   [provider]  Accu-Chek Softclix Lancets lancets  05/18/20   [provider]  acetaminophen (TYLENOL) 500 MG tablet Take 1,000 mg by mouth 2 (two) times daily as needed for mild pain.    [provider]  aspirin EC 81 MG tablet Take 81 mg by mouth daily.    [provider]  Blood Glucose Monitoring Suppl (ACCU-CHEK GUIDE) w/Device KIT  05/08/20   [provider]  cholecalciferol (VITAMIN D3) 25 MCG (1000 UNIT) tablet Take 1,000 Units by mouth daily.    [provider]  clonazePAM (KLONOPIN) 0.5 MG tablet Take 0.5 mg by mouth daily as needed for anxiety.    [provider]  ezetimibe (ZETIA) 10 MG tablet TAKE 1 TABLET DAILY AFTER SUPPER 03/27/21   [name], MD  gabapentin (NEURONTIN) 300 MG capsule Take 300 mg by mouth 2 (two) times daily. 01/26/20   [provider]  hydrALAZINE (APRESOLINE) 50 MG tablet Take 1 tablet (50 mg total) by mouth 3 (three) times daily. 02/08/21 02/03/22  Cantwell, Celeste C, PA-C  isosorbide dinitrate (ISORDIL) 30 MG tablet TAKE 1 TABLET THREE TIMES DAILY 06/05/21   Cantwell, Celeste C, PA-C  JANUVIA 100 MG tablet Take 100 mg by mouth daily.  10/24/16   [provider]  ketoconazole (NIZORAL) 2 % cream Apply 1 application topically 2 (two) times daily. 08/30/20   [provider]  labetalol (NORMODYNE) 100 MG tablet Take 2 tablets (200 mg total) by mouth 2 (two) times daily. 04/02/21   Cantwell, Celeste C, PA-C  LANTUS SOLOSTAR 100 UNIT/ML Solostar Pen Inject 15-50 Units into the skin See admin instructions. Inject 50 units in the morning and 15 units in the evening. 09/10/16   [provider]  losartan (COZAAR) 25 MG  tablet Take 1 tablet (25 mg total) by mouth daily. 02/08/21 02/03/22  Cantwell, Celeste C, PA-C  omeprazole (PRILOSEC) 40 MG capsule Take 40 mg by mouth daily.  11/22/18   [provider]  Polyvinyl Alcohol-Povidone (REFRESH OP) Place 1 drop into both eyes 3 (three) times daily as needed (dry eyes).    [provider]  Pyridoxine HCl (B-6 PO) Take 1 capsule by mouth daily.    [provider]  simvastatin (ZOCOR) 20 MG tablet Take 20 mg by mouth daily. 01/26/20   [provider]  tamsulosin (FLOMAX) 0.4 MG CAPS capsule Take 0.4 mg by mouth daily.    [provider]  vitamin B-12 (CYANOCOBALAMIN) 500 MCG tablet Take 500 mcg by mouth daily.    [provider]  vitamin E 180 MG (400 UNITS) capsule Take 400 Units by mouth daily.    [provider]  XARELTO 2.5 MG TABS tablet TAKE 1 TABLET TWICE DAILY 06/10/21   Cantwell, Gerline Legacy, PA-C    Family History Family History  Problem Relation Age of Onset   Diabetes Father    Diabetes Maternal Aunt    Diabetes Maternal Uncle    Diabetes Maternal Grandmother    Heart disease Sister    Diabetes Brother    Heart disease Sister     Social History Social History   Tobacco Use   Smoking status: Former    Packs/day: 0.25    Years: 2.00    Pack years: 0.50    Types: Cigarettes   Smokeless tobacco: Never   Tobacco comments:    when he was a teenage age [age]  Vaping Use   Vaping Use: Never used  Substance Use Topics   Alcohol use: No   Drug use: No     Allergies   Contrast media [iodinated diagnostic agents], Amlodipine, Dye fdc red [red dye], and Ibuprofen   Review of Systems Review of Systems   Physical Exam Triage Vital Signs ED Triage Vitals [06/14/21 1041]  Enc Vitals Group     BP (!) 123/45     Pulse Rate (!) 44     Resp 18     Temp 98.2 F (36.8 C)     Temp Source Oral     SpO2 100 %     Weight      Height      Head Circumference      Peak Flow      Pain  Score      Pain Loc  Pain Edu?      Excl. in [place]?    No data found.  Updated Vital Signs BP (!) 123/45 (BP Location: Right Arm)   Pulse (!) 44   Temp 98.2 F (36.8 C) (Oral)   Resp 18   SpO2 100%   Visual Acuity Right Eye Distance:   Left Eye Distance:   Bilateral Distance:    Right Eye Near:   Left Eye Near:    Bilateral Near:     Physical Exam Constitutional:      General: He is not in acute distress.    Appearance: He is well-developed. He is not ill-appearing.  HENT:     Head: Normocephalic and atraumatic.     Mouth/Throat:     Mouth: Mucous membranes are moist.     Pharynx: Oropharynx is clear.  Eyes:     General: No visual field deficit or scleral icterus.    Extraocular Movements: Extraocular movements intact.     Right eye: Normal extraocular motion and no nystagmus.     Left eye: Normal extraocular motion and no nystagmus.     Pupils: Pupils are equal.     Right eye: Pupil is not reactive. Pupil is round.     Left eye: Pupil is not reactive. Pupil is round.     Comments: Pupils are dilated bilaterally and not reactive to light or accommodation  Neck:     Meningeal: Brudzinski's sign absent.  Cardiovascular:     Comments: Heart rate was 44 on arrival however ventricular rate on EKG was 81.  Pulse is palpable and irregular, bradycardic.  Heart sounds are very distant, I am unable to distinguish whether S1 and 2 are normal, I do not appreciate any murmurs rubs or gallops. Pulmonary:     Effort: Pulmonary effort is normal.     Breath sounds: Normal breath sounds.  Musculoskeletal:     Cervical back: Normal range of motion and neck supple. No rigidity.  Lymphadenopathy:     Cervical: No cervical adenopathy.  Skin:    General: Skin is warm and dry.  Neurological:     Mental Status: He is alert and oriented to person, place, and time. Mental status is at baseline.     Cranial Nerves: No cranial nerve deficit, dysarthria or facial asymmetry.     Sensory:  No sensory deficit.     Motor: Weakness present.     Coordination: Romberg sign negative. Coordination abnormal.     Gait: Gait abnormal.     Deep Tendon Reflexes: Reflexes normal. Babinski sign absent on the right side. Babinski sign absent on the left side.  Psychiatric:        Mood and Affect: Mood normal.        Speech: Speech normal.        Behavior: Behavior normal.     UC Treatments / Results  Labs (all labs ordered are listed, but only abnormal results are displayed) Labs Reviewed - No data to display  EKG   Radiology No results found.  Procedures Procedures (including critical care time)  Medications Ordered in UC Medications - No data to display  Initial Impression / Assessment and Plan / UC Course  I have reviewed the triage vital signs and the nursing notes.  Pertinent labs & imaging results that were available during my care of the patient were reviewed by me and considered in my medical decision making (see chart for details).    Given patient's significant past cardiovascular and  neurological history, recent onset of dizziness, headache, shortness of breath and palpitations and by physical exam findings of bradycardia, low diastolic blood pressure and abnormal pupils, patient has been advised to go to emergency room now for evaluation.  This was explained to daughter as well who agreed with plan. Final Clinical Impressions(s) / UC Diagnoses   Final diagnoses:  Hypotension, unspecified hypotension type  Decreased cardiac ejection fraction  Autonomic dysfunction  Small vessel coronary artery disease  Bilateral carotid artery stenosis  History of myocardial infarction  Dizziness  Shortness of breath  Right bundle branch block (RBBB) determined by electrocardiography  Bradycardia     Discharge Instructions      It was a pleasure to meet you today Mr. Schmaltz.  The symptoms that you reported today include waves of dizziness occurring 3-4 times a day and  concomitant shortness of breath and palpitations with the waves of dizziness.  Because of your history of coronary artery disease leading to heart attack and subsequent surgery on your heart, significant carotid artery stenosis, kidney disease, autonomic insufficiency, autonomic orthostatic hypotension, decreased ejection fraction on your most recent echocardiogram and type 2 diabetes, your symptoms today are very concerning for worsening carotid artery stenosis, possible stroke, worsening coronary artery disease.  I recommend that you go to the emergency room now for acute evaluation of your symptoms.     ED Prescriptions   None    PDMP not reviewed this encounter.   Lynden Oxford Scales, PA-C 06/14/21 1212

## 2021-06-14 NOTE — Discharge Instructions (Addendum)
It was a pleasure to meet you today Justin [name].  The symptoms that you reported today include waves of dizziness occurring 3-4 times a day and concomitant shortness of breath and palpitations with the waves of dizziness.  Because of your history of coronary artery disease leading to heart attack and subsequent surgery on your heart, significant carotid artery stenosis, kidney disease, autonomic insufficiency, autonomic orthostatic hypotension, decreased ejection fraction on your most recent echocardiogram and type 2 diabetes, your symptoms today are very concerning for worsening carotid artery stenosis, possible stroke, worsening coronary artery disease.  I recommend that you go to the emergency room now for acute evaluation of your symptoms.

## 2021-06-14 NOTE — ED Provider Notes (Signed)
[facility] EMERGENCY DEPARTMENT Provider Note   CSN: [id number] Arrival date & time: 06/14/21  1200     History Chief Complaint  Patient presents with   Dizziness    Justin [name] is a 87 y.o. male who presents emergency department with chief complaint of dizziness.  Patient has a longstanding history of ataxia and peripheral neuropathy.  He also has a longstanding history of some mild vertiginous symptoms.  He states that 1 month ago he passed out in his bathroom and had trouble getting up off the floor.  Since that time he has had progressively worsening orthostatic symptoms and near syncope anytime he tries to stand up.  He has also had associated increasing shortness of breath.  The patient says that he sought help today because his symptoms were so bad that he could not even stand up.  He also feels like he is having worsening ataxia however this has been going on for some time.  The patient has ongoing tinnitus which is unchanged.  He is on Xarelto and states that he has had a GI bleed in the past.  He states that every morning he makes a bowel movement and when it first starts it is a little bit dark but then lightens up.  He denies hematochezia, abdominal pain, fevers, chills.  He denies peripheral edema or weight gain.   Dizziness Associated symptoms: shortness of breath   Associated symptoms: no chest pain, no nausea, no palpitations, no vomiting and no weakness       Past Medical History:  Diagnosis Date   Anxiety    Arthritis    Carotid arterial disease ([place])    CKD (chronic kidney disease)    Coronary artery disease    Diabetes mellitus without complication (HCC)    Diabetic retinopathy ([place])    NPDR OU   Diverticulitis    Dyspnea    GERD (gastroesophageal reflux disease)    History of kidney stones 2021   Hypercholesteremia    Hypertension    Hypertensive retinopathy    OU   Myocardial infarction [facility]) 2009   Pneumonia    as a child    Prostate cancer ([place])    UTI (lower urinary tract infection)     Patient Active Problem List   Diagnosis Date Noted   Lumbar stenosis with neurogenic claudication 11/09/2020   CKD stage 3 due to type 2 diabetes mellitus ([place]) 12/14/2019   Asymptomatic bilateral carotid artery stenosis 12/14/2019   Abdominal distention    CAD (coronary artery disease) 12/14/2018   Acute renal failure superimposed on stage 3 chronic kidney disease ([place]) 12/14/2018   UTI (urinary tract infection) 12/14/2018   HLD (hyperlipidemia) 12/14/2018   Sepsis ([place]) 12/14/2018   Abnormal LFTs    S/P CABG (coronary artery bypass graft) 12/06/2018   Asymptomatic stenosis of right carotid artery 12/06/2018   Claudication in peripheral vascular disease ([place]) 12/06/2018   Orthostatic hypotension 12/06/2018   Elevated liver enzymes 12/06/2018   Abdominal pain 10/12/2018   Abnormal liver function tests 10/12/2018   Foreign body in stomach 10/12/2018   Primary hypertension    GERD (gastroesophageal reflux disease)    Diabetes mellitus without complication ([place])    Atherosclerosis of native coronary artery of native heart without angina pectoris     Past Surgical History:  Procedure Laterality Date   ABDOMINAL SURGERY     for diverticulitis; also removed appendix   APPENDECTOMY     CARDIAC CATHETERIZATION  2018  CATARACT EXTRACTION     CATARACT EXTRACTION, BILATERAL     CHOLECYSTECTOMY N/A 10/12/2017   Procedure: LAPAROSCOPIC CHOLECYSTECTOMY;  Surgeon: [name], MD;  Location: [facility];  Service: General;  Laterality: N/A;   CORONARY ARTERY BYPASS GRAFT  2009   ERCP N/A 12/21/2018   Procedure: ENDOSCOPIC RETROGRADE CHOLANGIOPANCREATOGRAPHY (ERCP);  Surgeon: [name], MD;  Location: [facility];  Service: Endoscopy;  Laterality: N/A;   ESOPHAGOGASTRODUODENOSCOPY (EGD) WITH PROPOFOL N/A 12/17/2018   Procedure: ESOPHAGOGASTRODUODENOSCOPY (EGD) WITH PROPOFOL;  Surgeon: [name], MD;  Location: [facility];  Service: Endoscopy;  Laterality: N/A;   EUS Left 12/17/2018   Procedure: UPPER ENDOSCOPIC ULTRASOUND (EUS) LINEAR;  Surgeon: [name], MD;  Location: [facility];  Service: Endoscopy;  Laterality: Left;   EYE SURGERY     "for bleeding in eye"   HERNIA REPAIR     LEFT HEART CATH AND CORONARY ANGIOGRAPHY N/A 11/04/2016   Procedure: Left Heart Cath and Coronary Angiography;  Surgeon: [name], MD;  Location: [facility] CV LAB;  Service: Cardiovascular;  Laterality: N/A;   LOWER EXTREMITY ANGIOGRAPHY N/A 11/04/2016   Procedure: Lower Extremity Angiography;  Surgeon: [name], MD;  Location: [facility] CV LAB;  Service: Cardiovascular;  Laterality: N/A;   LOWER EXTREMITY ANGIOGRAPHY N/A 01/03/2020   Procedure: LOWER EXTREMITY ANGIOGRAPHY;  Surgeon: [name], MD;  Location: [facility] CV LAB;  Service: Cardiovascular;  Laterality: N/A;   LUMBAR LAMINECTOMY/DECOMPRESSION MICRODISCECTOMY Bilateral 11/09/2020   Procedure: Laminectomy and Foraminotomy - bilateral - Lumbar Four-Five.;  Surgeon: Earnie Larsson, MD;  Location: [facility];  Service: Neurosurgery;  Laterality: Bilateral;  posterior   PROSTATE SURGERY     REMOVAL OF STONES  12/21/2018   Procedure: REMOVAL OF STONES;  Surgeon: [name], MD;  Location: [facility];  Service: Endoscopy;;   SPHINCTEROTOMY  12/21/2018   Procedure: [name];  Surgeon: [name], MD;  [facility] END[facility]COPY;  Service: Endoscopy;;       Family History  Problem Relation Age of Onset   Diabetes Father    Diabetes Maternal Aunt    Diabetes Maternal Uncle    Diabetes Maternal Grandmother    Heart disease Sister    Diabetes Brother    Heart disease Sister     Social History   Tobacco Use   Smoking status: Former    Packs/day: 0.25    Years: 2.00    Pack years: 0.50    Types: Cigarettes   Smokeless tobacco: Never   Tobacco comments:    when he was a teenage age [age]  Vaping Use   Vaping Use: Never used  Substance Use Topics    Alcohol use: No   Drug use: No    Home Medications Prior to Admission medications   Medication Sig Start Date End Date Taking? Authorizing Provider  ACCU-CHEK SMARTVIEW test strip  03/27/19   [provider]  Accu-Chek Softclix Lancets lancets  05/18/20   [provider]  acetaminophen (TYLENOL) 500 MG tablet Take 1,000 mg by mouth 2 (two) times daily as needed for mild pain.    [provider]  aspirin EC 81 MG tablet Take 81 mg by mouth daily.    [provider]  Blood Glucose Monitoring Suppl (ACCU-CHEK GUIDE) w/Device KIT  05/08/20   [provider]  cholecalciferol (VITAMIN D3) 25 MCG (1000 UNIT) tablet Take 1,000 Units by mouth daily.    [provider]  clonazePAM (KLONOPIN) 0.5 MG tablet Take 0.5 mg by mouth daily  as needed for anxiety.    [provider]  ezetimibe (ZETIA) 10 MG tablet TAKE 1 TABLET DAILY AFTER SUPPER 03/27/21   [name], MD  gabapentin (NEURONTIN) 300 MG capsule Take 300 mg by mouth 2 (two) times daily. 01/26/20   [provider]  hydrALAZINE (APRESOLINE) 50 MG tablet Take 1 tablet (50 mg total) by mouth 3 (three) times daily. 02/08/21 02/03/22  Cantwell, Celeste C, PA-C  isosorbide dinitrate (ISORDIL) 30 MG tablet TAKE 1 TABLET THREE TIMES DAILY 06/05/21   Cantwell, Celeste C, PA-C  JANUVIA 100 MG tablet Take 100 mg by mouth daily.  10/24/16   [provider]  ketoconazole (NIZORAL) 2 % cream Apply 1 application topically 2 (two) times daily. 08/30/20   [provider]  labetalol (NORMODYNE) 100 MG tablet Take 2 tablets (200 mg total) by mouth 2 (two) times daily. 04/02/21   Cantwell, Celeste C, PA-C  LANTUS SOLOSTAR 100 UNIT/ML Solostar Pen Inject 15-50 Units into the skin See admin instructions. Inject 50 units in the morning and 15 units in the evening. 09/10/16   [provider]  losartan (COZAAR) 25 MG tablet Take 1 tablet (25 mg total) by mouth daily. 02/08/21 02/03/22   Cantwell, Celeste C, PA-C  omeprazole (PRILOSEC) 40 MG capsule Take 40 mg by mouth daily.  11/22/18   [provider]  Polyvinyl Alcohol-Povidone (REFRESH OP) Place 1 drop into both eyes 3 (three) times daily as needed (dry eyes).    [provider]  Pyridoxine HCl (B-6 PO) Take 1 capsule by mouth daily.    [provider]  simvastatin (ZOCOR) 20 MG tablet Take 20 mg by mouth daily. 01/26/20   [provider]  tamsulosin (FLOMAX) 0.4 MG CAPS capsule Take 0.4 mg by mouth daily.    [provider]  vitamin B-12 (CYANOCOBALAMIN) 500 MCG tablet Take 500 mcg by mouth daily.    [provider]  vitamin E 180 MG (400 UNITS) capsule Take 400 Units by mouth daily.    [provider]  XARELTO 2.5 MG TABS tablet TAKE 1 TABLET TWICE DAILY 06/10/21   Cantwell, Celeste C, PA-C    Allergies    Contrast media [iodinated diagnostic agents], Amlodipine, Dye fdc red [red dye], and Ibuprofen  Review of Systems   Review of Systems  Constitutional:  Negative for chills, fatigue and fever.  HENT: Negative.    Eyes: Negative.   Respiratory:  Positive for shortness of breath.   Cardiovascular:  Negative for chest pain, palpitations and leg swelling.  Gastrointestinal:  Negative for nausea and vomiting.  Genitourinary: Negative.   Musculoskeletal:  Positive for gait problem.  Neurological:  Positive for dizziness, syncope and light-headedness. Negative for tremors, speech difficulty and weakness.   Physical Exam Updated Vital Signs BP (!) 189/71   Pulse 63   Temp 97.7 F (36.5 C) (Oral)   Resp (!) 23   Ht 5' 7.5" (1.715 m)   Wt 81.6 kg   SpO2 100%   BMI 27.78 kg/m   Physical Exam Vitals and nursing note reviewed.  Constitutional:      General: He is not in acute distress.    Appearance: He is well-developed. He is not diaphoretic.  HENT:     Head: Normocephalic and atraumatic.  Eyes:     General: No scleral icterus.    Extraocular  Movements: Extraocular movements intact.     Right eye: No nystagmus.     Left eye: No nystagmus.  Conjunctiva/sclera: Conjunctivae normal.     Pupils: Pupils are equal, round, and reactive to light.  Cardiovascular:     Rate and Rhythm: Normal rate and regular rhythm.     Heart sounds: Normal heart sounds.  Pulmonary:     Effort: Pulmonary effort is normal. No respiratory distress.     Breath sounds: Normal breath sounds.  Abdominal:     Palpations: Abdomen is soft.     Tenderness: There is no abdominal tenderness.  Musculoskeletal:     Cervical back: Normal range of motion and neck supple.  Skin:    General: Skin is warm and dry.  Neurological:     Mental Status: He is alert.     GCS: GCS eye subscore is 4. GCS verbal subscore is 5. GCS motor subscore is 6.     Cranial Nerves: Cranial nerves are intact.     Motor: Motor function is intact. No pronator drift.     Coordination: Finger-Nose-Finger Test normal.     Deep Tendon Reflexes:     Reflex Scores:      Patellar reflexes are 2+ on the right side and 2+ on the left side.    Comments: Patient is very unsteady on feet requiring 2 person assist just to stand for orthostatics. Patient almost fell several times due to balance.  Psychiatric:        Behavior: Behavior normal.    ED Results / Procedures / Treatments   Labs (all labs ordered are listed, but only abnormal results are displayed) Labs Reviewed  CBC WITH DIFFERENTIAL/PLATELET - Abnormal; Notable for the following components:      Result Value   RBC 3.84 (*)    Hemoglobin 11.0 (*)    HCT 34.5 (*)    All other components within normal limits  COMPREHENSIVE METABOLIC PANEL - Abnormal; Notable for the following components:   CO2 19 (*)    Glucose, Bld 216 (*)    BUN 38 (*)    Creatinine, Ser 1.91 (*)    Albumin 3.4 (*)    GFR, Estimated 34 (*)    All other components within normal limits  CBG MONITORING, ED  TROPONIN I (HIGH SENSITIVITY)  TROPONIN I (HIGH  SENSITIVITY)    EKG None  Radiology DG Chest 2 View  Result Date: 06/14/2021 CLINICAL DATA:  Shortness of breath EXAM: CHEST - 2 VIEW COMPARISON:  12/14/2018 FINDINGS: Prior median sternotomy. Patient rotated right. The Chin overlies the apices. Midline trachea. Borderline cardiomegaly. No pleural effusion or pneumothorax. Clear lungs. Mild hyperinflation. IMPRESSION: No acute cardiopulmonary disease. Electronically Signed   By:  Miyamoto M.D.   On: 06/14/2021 13:56   CT Head Wo Contrast  Result Date: 06/14/2021 CLINICAL DATA:  Dizziness EXAM: CT HEAD WITHOUT CONTRAST TECHNIQUE: Contiguous axial images were obtained from the base of the skull through the vertex without intravenous contrast. COMPARISON:  None. FINDINGS: Brain: There is no acute intracranial hemorrhage, extra-axial fluid collection, or acute infarct. There is mild parenchymal volume loss with commensurate enlargement of the ventricular system. There is a mild burden of chronic white matter microangiopathy. A densely calcified dural-based lesion overlying the left anterior frontal lobe likely reflects a meningioma. There is no other mass lesion. There is no midline shift. Vascular: There is calcification of the bilateral cavernous ICAs and vertebral arteries. Skull: Normal. Negative for fracture or focal lesion. Sinuses/Orbits: The imaged paranasal sinuses are clear. Bilateral lens implants are in place. The globes and orbits are otherwise unremarkable. Other: None.  IMPRESSION: 1. No acute intracranial pathology. 2. Mild parenchymal volume loss and chronic white matter microangiopathy. Electronically Signed   By: Valetta Mole M.D.   On: 06/14/2021 14:21    Procedures Procedures   Medications Ordered in ED Medications - No data to display  ED Course  I have reviewed the triage vital signs and the nursing notes.  Pertinent labs & imaging results that were available during my care of the patient were reviewed by me and  considered in my medical decision making (see chart for details).  Clinical Course as of 06/14/21 2337  Fri Jun 14, 2021  2316 Orthostatics vs negative [AH]    Clinical Course User Index [AH] Margarita Mail, PA-C   MDM Rules/Calculators/A&P                           Patient here with dizziness and ataxia.It is very difficult to differentiate clear cause as history sounds like patient has c/o near syncope, but he has terrible balance and ataxia suggestive of neurologic cause such as stroke on exam. The patient will receive an MRI. Patient will likely need to be admitted for near syncope vs neurologic cause of ataxia. Doubt GI bleed. Doubt CHF.  Final Clinical Impression(s) / ED Diagnoses Final diagnoses:  None    Rx / DC Orders ED Discharge Orders     None        Margarita Mail, PA-C 06/14/21 2347    Tegeler, Gwenyth Allegra, MD 06/15/21 [phone]

## 2021-06-14 NOTE — ED Provider Notes (Signed)
23:30: Assumed care of patient from Margarita Mail PA-C at change of shift pending MRI and likely admission.   Briefly patient is an 87 yo male w/ a hx of DM, HTN, hypercholesterolemia, CAD, CKD, and peripheral neuropathy presents to the ED with complaints of dizziness. Reports intermittent dizziness x 1 month, but today this was worse, he felt like he was going to pass out with increased dizziness and almost falling twice prompting his daughter to take him to urgent care. He does not feel safe ambulating. In the ED he attempted to stand and felt very dizzy with weakness and could not walk with nursing staff.   MRI without acute stroke.  Labs similar to prior.   Patient remains with dizziness as if he may pass out when he attempts to stand, does not feel safe ambulating, plan for admission.   01:50: CONSULT: Discussed case with hospitalist Dr. Marlowe Sax- accepts admission.    Leafy Kindle 06/15/21 0151    Quintella Reichert, MD 06/15/21 1949

## 2021-06-14 NOTE — Telephone Encounter (Signed)
Attempted to call pt, no answer. Left vm requesting call back.

## 2021-06-14 NOTE — ED Notes (Signed)
Pt transported to MRI 

## 2021-06-15 ENCOUNTER — Encounter (HOSPITAL_COMMUNITY): Payer: Self-pay | Admitting: Internal Medicine

## 2021-06-15 DIAGNOSIS — R55 Syncope and collapse: Secondary | ICD-10-CM | POA: Diagnosis not present

## 2021-06-15 DIAGNOSIS — R29818 Other symptoms and signs involving the nervous system: Secondary | ICD-10-CM | POA: Diagnosis not present

## 2021-06-15 DIAGNOSIS — R42 Dizziness and giddiness: Secondary | ICD-10-CM

## 2021-06-15 LAB — GLUCOSE, CAPILLARY
Glucose-Capillary: 159 mg/dL — ABNORMAL HIGH (ref 70–99)
Glucose-Capillary: 167 mg/dL — ABNORMAL HIGH (ref 70–99)

## 2021-06-15 LAB — VITAMIN B12: Vitamin B-12: 924 pg/mL — ABNORMAL HIGH (ref 180–914)

## 2021-06-15 LAB — CBG MONITORING, ED
Glucose-Capillary: 208 mg/dL — ABNORMAL HIGH (ref 70–99)
Glucose-Capillary: 88 mg/dL (ref 70–99)

## 2021-06-15 LAB — SARS CORONAVIRUS 2 (TAT 6-24 HRS): SARS Coronavirus 2: NEGATIVE

## 2021-06-15 LAB — TSH: TSH: 1.235 u[IU]/mL (ref 0.350–4.500)

## 2021-06-15 MED ORDER — ACETAMINOPHEN 325 MG PO TABS: 650.0000 mg | ORAL_TABLET | Freq: Four times a day (QID) | ORAL | Status: DC | PRN

## 2021-06-15 MED ORDER — MECLIZINE HCL 25 MG PO TABS
25.0000 mg | ORAL_TABLET | Freq: Three times a day (TID) | ORAL | Status: DC
  Administered 2021-06-15 – 2021-06-16 (×3): 25 mg via ORAL
  Filled 2021-06-15 (×5): qty 1

## 2021-06-15 MED ORDER — VITAMIN B-12 1000 MCG PO TABS
500.0000 ug | ORAL_TABLET | Freq: Every day | ORAL | Status: DC
  Administered 2021-06-15 – 2021-06-16 (×2): 500 ug via ORAL
  Filled 2021-06-15 (×2): qty 1

## 2021-06-15 MED ORDER — MECLIZINE HCL 25 MG PO TABS
50.0000 mg | ORAL_TABLET | Freq: Once | ORAL | Status: AC
  Administered 2021-06-15: 50 mg via ORAL
  Filled 2021-06-15: qty 2

## 2021-06-15 MED ORDER — ASPIRIN EC 81 MG PO TBEC
81.0000 mg | DELAYED_RELEASE_TABLET | Freq: Every day | ORAL | Status: DC
  Administered 2021-06-15 – 2021-06-16 (×2): 81 mg via ORAL
  Filled 2021-06-15 (×2): qty 1

## 2021-06-15 MED ORDER — RIVAROXABAN 2.5 MG PO TABS
2.5000 mg | ORAL_TABLET | Freq: Two times a day (BID) | ORAL | Status: DC
  Administered 2021-06-15 – 2021-06-16 (×4): 2.5 mg via ORAL
  Filled 2021-06-15 (×5): qty 1

## 2021-06-15 MED ORDER — GABAPENTIN 300 MG PO CAPS
300.0000 mg | ORAL_CAPSULE | Freq: Two times a day (BID) | ORAL | Status: DC
  Filled 2021-06-15: qty 1
  Filled 2021-06-15: qty 3
  Filled 2021-06-15 (×2): qty 1

## 2021-06-15 MED ORDER — INSULIN GLARGINE-YFGN 100 UNIT/ML ~~LOC~~ SOLN
25.0000 [IU] | Freq: Two times a day (BID) | SUBCUTANEOUS | Status: DC
  Administered 2021-06-15 – 2021-06-16 (×3): 25 [IU] via SUBCUTANEOUS
  Filled 2021-06-15 (×4): qty 0.25

## 2021-06-15 MED ORDER — EZETIMIBE 10 MG PO TABS
10.0000 mg | ORAL_TABLET | Freq: Every evening | ORAL | Status: DC
  Filled 2021-06-15: qty 1

## 2021-06-15 MED ORDER — LINAGLIPTIN 5 MG PO TABS
5.0000 mg | ORAL_TABLET | Freq: Every day | ORAL | Status: DC
  Administered 2021-06-15 – 2021-06-16 (×2): 5 mg via ORAL
  Filled 2021-06-15 (×2): qty 1

## 2021-06-15 MED ORDER — INFLUENZA VAC A&B SA ADJ QUAD 0.5 ML IM PRSY
0.5000 mL | PREFILLED_SYRINGE | INTRAMUSCULAR | Status: AC
  Administered 2021-06-16: 0.5 mL via INTRAMUSCULAR
  Filled 2021-06-15: qty 0.5

## 2021-06-15 MED ORDER — ACETAMINOPHEN 650 MG RE SUPP
650.0000 mg | Freq: Four times a day (QID) | RECTAL | Status: DC | PRN
Start: 2021-06-15 — End: 2021-06-15

## 2021-06-15 MED ORDER — CLONAZEPAM 0.5 MG PO TABS
0.5000 mg | ORAL_TABLET | Freq: Two times a day (BID) | ORAL | Status: DC
  Administered 2021-06-15 – 2021-06-16 (×3): 0.5 mg via ORAL
  Filled 2021-06-15 (×3): qty 1

## 2021-06-15 MED ORDER — CARVEDILOL 6.25 MG PO TABS
6.2500 mg | ORAL_TABLET | Freq: Two times a day (BID) | ORAL | Status: DC
  Administered 2021-06-15 – 2021-06-16 (×3): 6.25 mg via ORAL
  Filled 2021-06-15: qty 2
  Filled 2021-06-15 (×2): qty 1

## 2021-06-15 MED ORDER — INSULIN GLARGINE 100 UNIT/ML SOLOSTAR PEN: 25.0000 [IU] | PEN_INJECTOR | Freq: Two times a day (BID) | SUBCUTANEOUS | Status: DC

## 2021-06-15 MED ORDER — SIMVASTATIN 20 MG PO TABS
20.0000 mg | ORAL_TABLET | Freq: Every day | ORAL | Status: DC
  Administered 2021-06-15 – 2021-06-16 (×2): 20 mg via ORAL
  Filled 2021-06-15 (×2): qty 1

## 2021-06-15 MED ORDER — INSULIN ASPART 100 UNIT/ML IJ SOLN: 0.0000 [IU] | Freq: Every day | INTRAMUSCULAR | Status: DC

## 2021-06-15 MED ORDER — PANTOPRAZOLE SODIUM 40 MG PO TBEC
40.0000 mg | DELAYED_RELEASE_TABLET | Freq: Every day | ORAL | Status: DC
  Administered 2021-06-15 – 2021-06-16 (×2): 40 mg via ORAL
  Filled 2021-06-15 (×2): qty 1

## 2021-06-15 MED ORDER — VITAMIN E 45 MG (100 UNIT) PO CAPS
400.0000 [IU] | ORAL_CAPSULE | Freq: Every day | ORAL | Status: DC
  Administered 2021-06-15 – 2021-06-16 (×2): 400 [IU] via ORAL
  Filled 2021-06-15 (×2): qty 4

## 2021-06-15 MED ORDER — TAMSULOSIN HCL 0.4 MG PO CAPS
0.4000 mg | ORAL_CAPSULE | Freq: Every day | ORAL | Status: DC
  Administered 2021-06-15 – 2021-06-16 (×2): 0.4 mg via ORAL
  Filled 2021-06-15 (×2): qty 1

## 2021-06-15 MED ORDER — INSULIN ASPART 100 UNIT/ML IJ SOLN
0.0000 [IU] | Freq: Three times a day (TID) | INTRAMUSCULAR | Status: DC
  Administered 2021-06-15: 5 [IU] via SUBCUTANEOUS
  Administered 2021-06-15 – 2021-06-16 (×3): 3 [IU] via SUBCUTANEOUS

## 2021-06-15 NOTE — H&P (Signed)
History and Physical    MEILECH VIRTS WUJ:[id number] DOB: 1933-04-10 DOA: 06/14/2021  PCP: [name], MD Patient coming from: Home  Chief Complaint: Dizziness  HPI: Justin BISCHOF is a 87 y.o. male with medical history significant of CAD status post CABG in 2009, PAD on anticoagulation, insulin-dependent type 2 diabetes with autonomic insufficiency and autonomic orthostatic hypotension, carotid stenosis, hyperlipidemia, CKD stage III, GERD, history of prostate cancer presented to the ED with complaints of dizziness and near syncope.  Labs notable for hemoglobin 11.0, stable.  Bicarb 19, chronically low.  Creatinine 1.9, stable.  High sensitive troponin negative x2.  Chest x-ray showing no acute cardiopulmonary disease.  Head CT negative for acute finding.  Brain MRI negative for acute finding.  Orthostatics negative.  Patient was given Ativan.  Patient states dizziness is a longstanding problem for him and has been going on for several years.  Last month he passed out in his bathroom.  States he mostly feels dizzy when going from sitting to standing position but sometimes also feels dizzy when just resting watching television.  Denies room spinning sensation.  Yesterday his dizziness became much worse.  He went out to eat breakfast with his family and while walking back to the car he felt like he was going to pass out.  He felt very unsteady on his feet and has not been able to walk all day.  Denies any episodes of palpitations or chest pain.  He feels short of breath all the time.  No other complaints.  Review of Systems:  All systems reviewed and apart from history of presenting illness, are negative.  Past Medical History:  Diagnosis Date   Anxiety    Arthritis    Carotid arterial disease ([place])    CKD (chronic kidney disease)    Coronary artery disease    Diabetes mellitus without complication (HCC)    Diabetic retinopathy ([place])    NPDR OU   Diverticulitis    Dyspnea    GERD  (gastroesophageal reflux disease)    History of kidney stones 2021   Hypercholesteremia    Hypertension    Hypertensive retinopathy    OU   Myocardial infarction [facility]) 2009   Pneumonia    as a child   Prostate cancer ([place])    UTI (lower urinary tract infection)     Past Surgical History:  Procedure Laterality Date   ABDOMINAL SURGERY     for diverticulitis; also removed appendix   APPENDECTOMY     CARDIAC CATHETERIZATION  2018   CATARACT EXTRACTION     CATARACT EXTRACTION, BILATERAL     CHOLECYSTECTOMY N/A 10/12/2017   Procedure: LAPAROSCOPIC CHOLECYSTECTOMY;  Surgeon: [name], MD;  Location: [place];  Service: General;  Laterality: N/A;   CORONARY ARTERY BYPASS GRAFT  2009   ERCP N/A 12/21/2018   Procedure: ENDOSCOPIC RETROGRADE CHOLANGIOPANCREATOGRAPHY (ERCP);  Surgeon: [name], MD;  Location: [place];  Service: Endoscopy;  Laterality: N/A;   ESOPHAGOGASTRODUODENOSCOPY (EGD) WITH PROPOFOL N/A 12/17/2018   Procedure: ESOPHAGOGASTRODUODENOSCOPY (EGD) WITH PROPOFOL;  Surgeon: [name], MD;  Location: [place];  Service: Endoscopy;  Laterality: N/A;   EUS Left 12/17/2018   Procedure: UPPER ENDOSCOPIC ULTRASOUND (EUS) LINEAR;  Surgeon: [name], MD;  Location: [place];  Service: Endoscopy;  Laterality: Left;   EYE SURGERY     "for bleeding in eye"   HERNIA REPAIR     LEFT HEART CATH AND CORONARY ANGIOGRAPHY N/A 11/04/2016   Procedure: Left Heart Cath  and Coronary Angiography;  Surgeon: [name], MD;  Location: [place] CV LAB;  Service: Cardiovascular;  Laterality: N/A;   LOWER EXTREMITY ANGIOGRAPHY N/A 11/04/2016   Procedure: Lower Extremity Angiography;  Surgeon: [name], MD;  Location: [place] CV LAB;  Service: Cardiovascular;  Laterality: N/A;   LOWER EXTREMITY ANGIOGRAPHY N/A 01/03/2020   Procedure: LOWER EXTREMITY ANGIOGRAPHY;  Surgeon: [name], MD;  Location: [place] CV LAB;  Service: Cardiovascular;  Laterality: N/A;   LUMBAR  LAMINECTOMY/DECOMPRESSION MICRODISCECTOMY Bilateral 11/09/2020   Procedure: Laminectomy and Foraminotomy - bilateral - Lumbar Four-Five.;  Surgeon: Earnie Larsson, MD;  Location: [place];  Service: Neurosurgery;  Laterality: Bilateral;  posterior   PROSTATE SURGERY     REMOVAL OF STONES  12/21/2018   Procedure: REMOVAL OF STONES;  Surgeon: [name], MD;  Location: [facility] ENDOSCOPY;  Service: Endoscopy;;   SPHINCTEROTOMY  12/21/2018   Procedure: [name];  Surgeon: [name], MD;  Location: [place];  Service: Endoscopy;;     reports that he has quit smoking. His smoking use included cigarettes. He has a 0.50 pack-year smoking history. He has never used smokeless tobacco. He reports that he does not drink alcohol and does not use drugs.  Allergies  Allergen Reactions   Contrast Media [Iodinated Diagnostic Agents] Itching    PT STATES ALLERGY TO "CT DYE".   Amlodipine Swelling    Leg swelling   Dye Fdc Red [Red Dye] Swelling    CAT scan dye   Ibuprofen Other (See Comments)    Upset stomach     Family History  Problem Relation Age of Onset   Diabetes Father    Diabetes Maternal Aunt    Diabetes Maternal Uncle    Diabetes Maternal Grandmother    Heart disease Sister    Diabetes Brother    Heart disease Sister     Prior to Admission medications   Medication Sig Start Date End Date Taking? Authorizing Provider  ACCU-CHEK SMARTVIEW test strip  03/27/19  Yes [provider]  Accu-Chek Softclix Lancets lancets  05/18/20  Yes [provider]  acetaminophen (TYLENOL) 500 MG tablet Take 1,000 mg by mouth 2 (two) times daily as needed for mild pain.   Yes [provider]  aspirin EC 81 MG tablet Take 81 mg by mouth daily.   Yes [provider]  Blood Glucose Monitoring Suppl (ACCU-CHEK GUIDE) w/Device KIT  05/08/20  Yes [provider]  cholecalciferol (VITAMIN D3) 25 MCG (1000 UNIT) tablet Take 1,000 Units by mouth daily.   Yes [provider]  clonazePAM (KLONOPIN) 0.5 MG tablet Take 0.5 mg by mouth daily as needed for anxiety.   Yes [provider]  ezetimibe (ZETIA) 10 MG tablet TAKE 1 TABLET DAILY AFTER SUPPER Patient taking differently: Take 10 mg by mouth every evening. 03/27/21  Yes [name], MD  gabapentin (NEURONTIN) 300 MG capsule Take 300 mg by mouth 2 (two) times daily. 01/26/20  Yes [provider]  hydrALAZINE (APRESOLINE) 50 MG tablet Take 1 tablet (50 mg total) by mouth 3 (three) times daily. 02/08/21 02/03/22 Yes Cantwell, Celeste C, PA-C  isosorbide dinitrate (ISORDIL) 30 MG tablet TAKE 1 TABLET THREE TIMES DAILY Patient taking differently: Take 30 mg by mouth 3 (three) times daily. 06/05/21  Yes Cantwell, Celeste C, PA-C  JANUVIA 100 MG tablet Take 100 mg by mouth daily.  10/24/16  Yes [provider]  ketoconazole (NIZORAL) 2 % cream Apply 1 application topically daily. 08/30/20  Yes [provider]  labetalol (NORMODYNE) 100 MG tablet Take 2 tablets (200 mg total) by mouth 2 (two) times daily. 04/02/21  Yes Cantwell, Celeste C, PA-C  LANTUS SOLOSTAR 100 UNIT/ML Solostar Pen Inject 15-50 Units into the skin See admin instructions. Inject 50 units in the morning and 15 units in the evening. 09/10/16  Yes [provider]  losartan (COZAAR) 25 MG tablet Take 1 tablet (25 mg total) by mouth daily. 02/08/21 02/03/22 Yes Cantwell, Celeste C, PA-C  omeprazole (PRILOSEC) 40 MG capsule Take 40 mg by mouth daily.  11/22/18  Yes [provider]  Polyvinyl Alcohol-Povidone (REFRESH OP) Place 1 drop into both eyes 3 (three) times daily as needed (dry eyes).   Yes [provider]  Pyridoxine HCl (B-6 PO) Take 1 capsule by mouth daily.   Yes [provider]  simvastatin (ZOCOR) 20 MG tablet Take 20 mg by mouth daily. 01/26/20  Yes [provider]  tamsulosin (FLOMAX) 0.4 MG CAPS capsule Take 0.4 mg by mouth daily.   Yes [provider]   vitamin B-12 (CYANOCOBALAMIN) 500 MCG tablet Take 500 mcg by mouth daily.   Yes [provider]  vitamin E 180 MG (400 UNITS) capsule Take 400 Units by mouth daily.   Yes [provider]  XARELTO 2.5 MG TABS tablet TAKE 1 TABLET TWICE DAILY Patient taking differently: Take 2.5 mg by mouth 2 (two) times daily. 06/10/21  Yes Alethia Berthold, PA-C    Physical Exam: Vitals:   06/14/21 2204 06/14/21 2307 06/15/21 0106 06/15/21 0145  BP: (!) 189/71 (!) 161/69 (!) 141/78 (!) 162/62  Pulse: 63 71 70 61  Resp: (!) 23 20 (!) 21 (!) 24  Temp:      TempSrc:      SpO2: 100% 100% 96% 98%  Weight:      Height:        Physical Exam Constitutional:      General: He is not in acute distress. HENT:     Head: Normocephalic and atraumatic.  Eyes:     Extraocular Movements: Extraocular movements intact.     Conjunctiva/sclera: Conjunctivae normal.  Cardiovascular:     Rate and Rhythm: Normal rate and regular rhythm.     Pulses: Normal pulses.  Pulmonary:     Effort: Pulmonary effort is normal. No respiratory distress.     Breath sounds: Normal breath sounds. No wheezing or rales.  Abdominal:     General: Bowel sounds are normal. There is no distension.     Palpations: Abdomen is soft.     Tenderness: There is no abdominal tenderness.  Musculoskeletal:        General: No swelling or tenderness.     Cervical back: Normal range of motion and neck supple.  Skin:    General: Skin is warm and dry.  Neurological:     General: No focal deficit present.     Mental Status: He is alert and oriented to person, place, and time.     Labs on Admission: I have personally reviewed following labs and imaging studies  CBC: Recent Labs  Lab 06/14/21 1314  WBC 8.6  NEUTROABS 6.5  HGB 11.0*  HCT 34.5*  MCV 89.8  PLT 102   Basic Metabolic Panel: Recent Labs  Lab 06/14/21 1314  NA 136  K 4.3  CL 108  CO2 19*  GLUCOSE 216*  BUN 38*  CREATININE 1.91*  CALCIUM 9.2    GFR: Estimated Creatinine Clearance: 28.7  mL/min (A) (by C-G formula based on SCr of 1.91 mg/dL (H)). Liver Function Tests: Recent Labs  Lab 06/14/21 1314  AST 15  ALT 14  ALKPHOS 56  BILITOT 0.4  PROT 6.7  ALBUMIN 3.4*   No results for input(s): LIPASE, AMYLASE in the last 168 hours. No results for input(s): AMMONIA in the last 168 hours. Coagulation Profile: No results for input(s): INR, PROTIME in the last 168 hours. Cardiac Enzymes: No results for input(s): CKTOTAL, CKMB, CKMBINDEX, TROPONINI in the last 168 hours. BNP (last 3 results) No results for input(s): PROBNP in the last 8760 hours. HbA1C: No results for input(s): HGBA1C in the last 72 hours. CBG: Recent Labs  Lab 06/14/21 2311  GLUCAP 168*   Lipid Profile: No results for input(s): CHOL, HDL, LDLCALC, TRIG, CHOLHDL, LDLDIRECT in the last 72 hours. Thyroid Function Tests: No results for input(s): TSH, T4TOTAL, FREET4, T3FREE, THYROIDAB in the last 72 hours. Anemia Panel: No results for input(s): VITAMINB12, FOLATE, FERRITIN, TIBC, IRON, RETICCTPCT in the last 72 hours. Urine analysis:    Component Value Date/Time   COLORURINE RED (A) 03/10/2021 1108   APPEARANCEUR TURBID (A) 03/10/2021 1108   LABSPEC  03/10/2021 1108    TEST NOT REPORTED DUE TO COLOR INTERFERENCE OF URINE PIGMENT   PHURINE  03/10/2021 1108    TEST NOT REPORTED DUE TO COLOR INTERFERENCE OF URINE PIGMENT   GLUCOSEU (A) 03/10/2021 1108    TEST NOT REPORTED DUE TO COLOR INTERFERENCE OF URINE PIGMENT   HGBUR (A) 03/10/2021 1108    TEST NOT REPORTED DUE TO COLOR INTERFERENCE OF URINE PIGMENT   BILIRUBINUR (A) 03/10/2021 1108    TEST NOT REPORTED DUE TO COLOR INTERFERENCE OF URINE PIGMENT   KETONESUR (A) 03/10/2021 1108    TEST NOT REPORTED DUE TO COLOR INTERFERENCE OF URINE PIGMENT   PROTEINUR (A) 03/10/2021 1108    TEST NOT REPORTED DUE TO COLOR INTERFERENCE OF URINE PIGMENT   UROBILINOGEN 0.2 03/10/2021 1027   NITRITE (A) 03/10/2021  1108    TEST NOT REPORTED DUE TO COLOR INTERFERENCE OF URINE PIGMENT   LEUKOCYTESUR (A) 03/10/2021 1108    TEST NOT REPORTED DUE TO COLOR INTERFERENCE OF URINE PIGMENT    Radiological Exams on Admission: DG Chest 2 View  Result Date: 06/14/2021 CLINICAL DATA:  Shortness of breath EXAM: CHEST - 2 VIEW COMPARISON:  12/14/2018 FINDINGS: Prior median sternotomy. Patient rotated right. The Chin overlies the apices. Midline trachea. Borderline cardiomegaly. No pleural effusion or pneumothorax. Clear lungs. Mild hyperinflation. IMPRESSION: No acute cardiopulmonary disease. Electronically Signed   By: Abigail Miyamoto M.D.   On: 06/14/2021 13:56   CT Head Wo Contrast  Result Date: 06/14/2021 CLINICAL DATA:  Dizziness EXAM: CT HEAD WITHOUT CONTRAST TECHNIQUE: Contiguous axial images were obtained from the base of the skull through the vertex without intravenous contrast. COMPARISON:  None. FINDINGS: Brain: There is no acute intracranial hemorrhage, extra-axial fluid collection, or acute infarct. There is mild parenchymal volume loss with commensurate enlargement of the ventricular system. There is a mild burden of chronic white matter microangiopathy. A densely calcified dural-based lesion overlying the left anterior frontal lobe likely reflects a meningioma. There is no other mass lesion. There is no midline shift. Vascular: There is calcification of the bilateral cavernous ICAs and vertebral arteries. Skull: Normal. Negative for fracture or focal lesion. Sinuses/Orbits: The imaged paranasal sinuses are clear. Bilateral lens implants are in place. The globes and orbits are otherwise unremarkable. Other: None. IMPRESSION: 1. No acute  intracranial pathology. 2. Mild parenchymal volume loss and chronic white matter microangiopathy. Electronically Signed   By: Valetta Mole M.D.   On: 06/14/2021 14:21   MR BRAIN WO CONTRAST  Result Date: 06/15/2021 CLINICAL DATA:  Acute neurologic deficit EXAM: MRI HEAD WITHOUT  CONTRAST TECHNIQUE: Multiplanar, multiecho pulse sequences of the brain and surrounding structures were obtained without intravenous contrast. COMPARISON:  None. FINDINGS: Brain: No acute infarct, mass effect or extra-axial collection. Focus of chronic microhemorrhage in the right cerebellar hemisphere. There is multifocal hyperintense T2-weighted signal within the white matter. Generalized volume loss without a clear lobar predilection. The midline structures are normal. Vascular: Major flow voids are preserved. Skull and upper cervical spine: Normal calvarium and skull base. Visualized upper cervical spine and soft tissues are normal. Sinuses/Orbits:No paranasal sinus fluid levels or advanced mucosal thickening. No mastoid or middle ear effusion. Normal orbits. IMPRESSION: 1. No acute intracranial abnormality. 2. Generalized volume loss and findings of chronic small vessel ischemia. Electronically Signed   By: Ulyses Jarred M.D.   On: 06/15/2021 00:22    EKG: Independently reviewed.  Sinus rhythm, RBBB similar to prior tracing.  Assessment/Plan Principal Problem:   Near syncope Active Problems:   Primary hypertension   GERD (gastroesophageal reflux disease)   CAD (coronary artery disease)   Dizziness   Dizziness, near syncope Head CT and brain MRI negative for acute finding.  Chest x-ray showing no acute cardiopulmonary disease.  High sensitive troponin negative x2.  EKG showing sinus rhythm and RBBB, no acute changes.  PE less likely given no chest pain, hypoxia, or tachycardia.  Orthostatics negative.  Does have bilateral carotid stenosis but Doppler done last month showing stable findings. Echo done 12/2020 showing normal EF, grade 1 diastolic dysfunction, and moderate grade 2 mitral regurgitation.  He is a diabetic and autonomic dysfunction likely contributing. -Cardiac monitoring.  Repeat echocardiogram.  PT vestibular evaluation.  Fall precautions.  CAD status post CABG in 2009 -Continue  aspirin and statin.  PAD -Continue aspirin, Xarelto, and statin.  Insulin-dependent type 2 diabetes A1c 7.2 in 10/2020. -Sliding scale insulin moderate ACHS.  Diabetic neuropathy -Continue gabapentin  Hyperlipidemia -Continue Zetia and Zocor.  CKD stage III -Creatinine stable.  GERD -Continue PPI  BPH -Continue Flomax  DVT prophylaxis: Xarelto Code Status: Patient wishes to be full code. Family Communication: No family available at this time. Disposition Plan: Status is: Observation  The patient remains OBS appropriate and will d/c before 2 midnights.  Dispo: The patient is from: Home              Anticipated d/c is to: Home              Patient currently is not medically stable to d/c.   Difficult to place patient No  Level of care: Level of care: Telemetry Cardiac  The medical decision making on this patient was of high complexity and the patient is at high risk for clinical deterioration, therefore this is a level 3 visit.  Shela Leff MD [facility]  If 7PM-7AM, please contact night-coverage [email or web address]  06/15/2021, 2:04 AM

## 2021-06-15 NOTE — ED Notes (Signed)
Pt returned from MRI °

## 2021-06-15 NOTE — Evaluation (Signed)
Occupational Therapy Evaluation Patient Details Name: Justin [name] MRN: [id number] DOB: April 30, 1933 Today's Date: 06/15/2021   History of Present Illness 87 y.o. male presented to the ED 06/14/21 with complaints of dizziness and near syncope. Orthostatics, CT head, and MRI brain all negative. PMHx significant for anxiety, CAD, CKD, DM with autonomic insufficiency and autonomic orthostatic hypotension, HTN, MI, Hx of prostate cancer, back surgery.   Clinical Impression   Pt admitted with the above diagnosis and presents with the below listed deficits.  He currently is able to complete ADLs with min guard assist for LB ADLs and functional mobility.  She reports he lives with family and has good support at home.  See below for BP readings - pt was positive for orthostatic hypotension with ~5 mins of standing, but recovered after another ~~5 mins of being upright. Pt does not need further OT services.       06/15/21 1100  Orthostatic Lying   BP- Lying 151/75  Pulse- Lying 73  Orthostatic Sitting  BP- Sitting 153/72  Pulse- Sitting 75  Orthostatic Standing at 0 minutes  BP- Standing at 0 minutes 171/66  Pulse- Standing at 0 minutes 91  Orthostatic Standing at 3 minutes  BP- Standing at 3 minutes 161/61  Pulse- Standing at 3 minutes 92    BP at 5 mins 125/94, and at 10 mins 155/92        Recommendations for follow up therapy are one component of a multi-disciplinary discharge planning process, led by the attending physician.  Recommendations may be updated based on patient status, additional functional criteria and insurance authorization.   Follow Up Recommendations  No OT follow up;Supervision/Assistance - 24 hour    Equipment Recommendations  None recommended by OT    Recommendations for Other Services       Precautions / Restrictions Precautions Precautions: Fall Precaution Comments: dizziness; neuropathy      Mobility Bed Mobility Overal bed mobility: Needs  Assistance Bed Mobility: Supine to Sit;Sit to Supine     Supine to sit: Mod assist Sit to supine: Supervision   General bed mobility comments: on ED gurney; incr assist to raise torso (pt kicking legs for momentum to try to come up to sitting)    Transfers Overall transfer level: Needs assistance Equipment used: Rolling walker (2 wheeled) Transfers: Sit to/from Stand Sit to Stand: Min guard         General transfer comment: no imbalance or dizziness on initial standing    Balance Overall balance assessment: Needs assistance Sitting-balance support: No upper extremity supported;Feet unsupported Sitting balance-Leahy Scale: Good     Standing balance support: No upper extremity supported Standing balance-Leahy Scale: Fair Standing balance comment: pt stood a total of 15 minutes with repeated standing BPs to assess for delayed orthostasis (BP dropped after 6 minutes, but asymptomatic). Became fatigued with anterior-posterior sway after ~15 minutes                           ADL either performed or assessed with clinical judgement   ADL Overall ADL's : Needs assistance/impaired Eating/Feeding: Independent   Grooming: Wash/dry hands;Wash/dry face;Oral care;Brushing hair;Min guard;Standing   Upper Body Bathing: Set up;Sitting   Lower Body Bathing: Set up;Sit to/from stand   Upper Body Dressing : Min guard;Sitting   Lower Body Dressing: Min guard;Sit to/from stand   Toilet Transfer: Min guard;Ambulation;Comfort height toilet;RW   Toileting- Water quality scientist and Hygiene: Min guard;Sit to/from stand  Functional mobility during ADLs: Min guard;Rolling walker General ADL Comments: min guard assist to steady     Vision Baseline Vision/History: 1 Wears glasses Ability to See in Adequate Light: 0 Adequate Patient Visual Report: No change from baseline       Perception Perception Perception Tested?: Yes   Praxis Praxis Praxis tested?: Within  functional limits    Pertinent Vitals/Pain Pain Assessment: Faces Faces Pain Scale: Hurts even more Pain Location: bil legs Pain Descriptors / Indicators: Aching;Discomfort Pain Intervention(s): Monitored during session;Premedicated before session     Hand Dominance Right   Extremity/Trunk Assessment Upper Extremity Assessment Upper Extremity Assessment: Overall WFL for tasks assessed   Lower Extremity Assessment Lower Extremity Assessment: Defer to PT evaluation RLE Sensation: history of peripheral neuropathy LLE Sensation: history of peripheral neuropathy   Cervical / Trunk Assessment Cervical / Trunk Assessment: Normal   Communication Communication Communication: No difficulties   Cognition Arousal/Alertness: Awake/alert Behavior During Therapy: WFL for tasks assessed/performed Overall Cognitive Status: Within Functional Limits for tasks assessed                                     General Comments  Pt with +_orthostatic hypotension with 5 mins of standing, but then BP improved after ~10 mins standing    Exercises     Shoulder Instructions      Home Living Family/patient expects to be discharged to:: Private residence Living Arrangements: Children;Other relatives (grandaughter, great-grandaughter) Available Help at Discharge: Family;Available 24 hours/day Type of Home: House Home Access: Stairs to enter     Home Layout: Two level;Able to live on main level with bedroom/bathroom;1/2 bath on main level Alternate Level Stairs-Number of Steps: full flight   Bathroom Shower/Tub: Teacher, early years/pre: Standard Bathroom Accessibility: Yes   Home Equipment: Cane - single point;Walker - 2 wheels          Prior Functioning/Environment Level of Independence: Independent with assistive device(s)        Comments: pt reports ambulating with a cane at all times; mostly does sink bath in downstairs 1/2 bath (if goes upstairs to shower  has family member with him)        OT Problem List:        OT Treatment/Interventions:      OT Goals(Current goals can be found in the care plan section) Acute Rehab OT Goals Patient Stated Goal: find out why he gets dizzy and fix it OT Goal Formulation: All assessment and education complete, DC therapy  OT Frequency:     Barriers to D/C:            Co-evaluation PT/OT/SLP Co-Evaluation/Treatment: Yes Reason for Co-Treatment: To address functional/ADL transfers;For patient/therapist safety PT goals addressed during session: Mobility/safety with mobility;Balance OT goals addressed during session: ADL's and self-care      AM-PAC OT "6 Clicks" Daily Activity     Outcome Measure Help from another person eating meals?: None Help from another person taking care of personal grooming?: A Little Help from another person toileting, which includes using toliet, bedpan, or urinal?: A Little Help from another person bathing (including washing, rinsing, drying)?: A Little Help from another person to put on and taking off regular upper body clothing?: A Little Help from another person to put on and taking off regular lower body clothing?: A Little 6 Click Score: 19   End of Session Equipment Utilized During Treatment:  Rolling walker;Gait belt Nurse Communication: Mobility status  Activity Tolerance: Patient limited by fatigue Patient left: in bed                   Time: [id number] OT Time Calculation (min): 36 min Charges:  OT General Charges $OT Visit: 1 Visit OT Evaluation $OT Eval Moderate Complexity: 1 Mod  Nilsa Nutting., OTR/L Acute Rehabilitation Services Pager [phone] Office [phone]   Lucille Passy M 06/15/2021, 2:14 PM

## 2021-06-15 NOTE — Evaluation (Signed)
Physical Therapy Evaluation Patient Details Name: Justin [name] MRN: [id number] DOB: 1933-03-27 Today's Date: 06/15/2021  History of Present Illness  87 y.o. male presented to the ED 06/14/21 with complaints of dizziness and near syncope. Orthostatics, CT head, and MRI brain all negative. PMHx significant for anxiety, CAD, CKD, DM with autonomic insufficiency and autonomic orthostatic hypotension, HTN, MI, Hx of prostate cancer, back surgery.  Clinical Impression   Pt admitted secondary to problem above with deficits below. PTA patient was having episodes of dizziness that included dimming of his vision and spinning sensation. At times the symptoms occurred when he was still in his chair, but mostly when he was up walking. His symptoms also include history of tinnitus. His presentation is not that of purely vestibular cause for his dizziness. His balance is also impaired due to his bil LE neuropathy.  Pt currently requires minguard assist for transfers. He has excellent support at home and they can be with him when he is up and walking.  Anticipate patient will benefit from PT to address problems listed below.Will continue to follow acutely to maximize functional mobility independence and safety.          Recommendations for follow up therapy are one component of a multi-disciplinary discharge planning process, led by the attending physician.  Recommendations may be updated based on patient status, additional functional criteria and insurance authorization.  Follow Up Recommendations No PT follow up yet (per pt, being referred to ENT for further workup; after workup likely could benefit from vestibular rehab and balance training)    Equipment Recommendations  None recommended by PT    Recommendations for Other Services       Precautions / Restrictions    Vestibular Assessment Precautions Precautions: Fall Precaution Comments: dizziness; neuropathy     06/15/21 0001  Symptom Behavior   Subjective history of current problem when symptoms occur (which has been both sitting at rest and when walking) he feels drunk, "bouncing off the walls," and then his vision begins to dim and become tunnel-like. Symptoms last seconds and then go away.  Type of Dizziness  Imbalance;Spinning  Frequency of Dizziness can go months between episodes; 2 recent several days apart  Duration of Dizziness seconds  Symptom Nature Spontaneous;Variable  Aggravating Factors No known aggravating factors  Relieving Factors No known relieving factors  Progression of Symptoms No change since onset  History of similar episodes yes  Oculomotor Exam  Oculomotor Alignment Normal  Ocular ROM normal  Gaze-induced  Absent  Smooth Pursuits Saccades  Comment noted vertical saccades during smooth pursuit testing, in both left and right field of vision  Vestibulo-Ocular Reflex  VOR 1 Head Only (x 1 viewing) pt could maintain eyes on target x 30 sec without symptoms  VOR Cancellation Normal  Auditory  Comments reports ringing in his ears for some time now  Orthostatics  Orthostatics Comment see vitals flowsheet     Mobility  Bed Mobility Overal bed mobility: Needs Assistance Bed Mobility: Supine to Sit;Sit to Supine     Supine to sit: Mod assist Sit to supine: Supervision   General bed mobility comments: on ED gurney; incr assist to raise torso (pt kicking legs for momentum to try to come up to sitting)    Transfers Overall transfer level: Needs assistance Equipment used: Rolling walker (2 wheeled) Transfers: Sit to/from Stand Sit to Stand: Min guard         General transfer comment: no imbalance or dizziness on initial standing  Ambulation/Gait             General Gait Details: deferred due to fatigue and increasing imbalance while standing for orthostatic BPs  Stairs            Wheelchair Mobility    Modified Rankin (Stroke Patients Only)       Balance Overall balance  assessment: Needs assistance Sitting-balance support: No upper extremity supported;Feet unsupported Sitting balance-Leahy Scale: Good     Standing balance support: No upper extremity supported Standing balance-Leahy Scale: Fair Standing balance comment: pt stood a total of 15 minutes with repeated standing BPs to assess for delayed orthostasis (BP dropped after 6 minutes, but asymptomatic). Became fatigued with anterior-posterior sway after ~15 minutes                             Pertinent Vitals/Pain Pain Assessment: Faces Faces Pain Scale: Hurts even more Pain Location: bil legs Pain Descriptors / Indicators: Aching;Discomfort Pain Intervention(s): Limited activity within patient's tolerance    Home Living Family/patient expects to be discharged to:: Private residence Living Arrangements: Children;Other relatives (grandaughter, great-grandaughter) Available Help at Discharge: Family;Available 24 hours/day Type of Home: House Home Access: Stairs to enter     Home Layout: Two level;Able to live on main level with bedroom/bathroom;1/2 bath on main level Home Equipment: Cane - single point;Walker - 2 wheels      Prior Function Level of Independence: Independent with assistive device(s)         Comments: pt reports ambulating with a cane at all times; mostly does sink bath in downstairs 1/2 bath (if goes upstairs to shower has family member with him)     Hand Dominance   Dominant Hand: Right    Extremity/Trunk Assessment   Upper Extremity Assessment Upper Extremity Assessment: Defer to OT evaluation    Lower Extremity Assessment Lower Extremity Assessment: RLE deficits/detail;LLE deficits/detail RLE Sensation: history of peripheral neuropathy LLE Sensation: history of peripheral neuropathy    Cervical / Trunk Assessment Cervical / Trunk Assessment: Normal  Communication   Communication: No difficulties  Cognition Arousal/Alertness:  Awake/alert Behavior During Therapy: WFL for tasks assessed/performed Overall Cognitive Status: Within Functional Limits for tasks assessed                                        General Comments      Exercises     Assessment/Plan    PT Assessment Patient needs continued PT services  PT Problem List Decreased balance;Decreased mobility;Decreased knowledge of use of DME;Impaired sensation;Pain       PT Treatment Interventions DME instruction;Gait training;Functional mobility training;Therapeutic activities;Therapeutic exercise;Balance training;Patient/family education    PT Goals (Current goals can be found in the Care Plan section)  Acute Rehab PT Goals Patient Stated Goal: find out why he gets dizzy and fix it PT Goal Formulation: With patient Time For Goal Achievement: 06/29/21 Potential to Achieve Goals: Fair    Frequency Min 3X/week   Barriers to discharge        Co-evaluation PT/OT/SLP Co-Evaluation/Treatment: Yes Reason for Co-Treatment: For patient/therapist safety PT goals addressed during session: Mobility/safety with mobility;Balance         AM-PAC PT "6 Clicks" Mobility  Outcome Measure Help needed turning from your back to your side while in a flat bed without using bedrails?: None Help needed moving from lying on  your back to sitting on the side of a flat bed without using bedrails?: A Lot Help needed moving to and from a bed to a chair (including a wheelchair)?: A Little Help needed standing up from a chair using your arms (e.g., wheelchair or bedside chair)?: A Little Help needed to walk in hospital room?: A Lot Help needed climbing 3-5 steps with a railing? : A Lot 6 Click Score: 16    End of Session Equipment Utilized During Treatment: Gait belt Activity Tolerance: Patient limited by fatigue Patient left: in bed;with call bell/phone within reach (returned to ED gurney) Nurse Communication: Mobility status PT Visit Diagnosis:  Unsteadiness on feet (R26.81);Difficulty in walking, not elsewhere classified (R26.2);Other symptoms and signs involving the nervous system [id number])    Time: [id number] PT Time Calculation (min) (ACUTE ONLY): 38 min   Charges:   PT Evaluation $PT Eval Moderate Complexity: 1 Mod PT Treatments $Therapeutic Activity: 8-22 mins         Arby Barrette, PT Pager [phone]   Rexanne Mano 06/15/2021, 2:02 PM

## 2021-06-15 NOTE — Progress Notes (Signed)
PROGRESS NOTE                                                                                                                                                                                                             Patient Demographics:    Justin [name], is a 87 y.o. male, DOB - 07-Aug-1933, [id number]  Outpatient Primary MD for the patient is Osei-Bonsu, Iona Beard, MD    LOS - 0  Admit date - 06/14/2021    Chief Complaint  Patient presents with   Dizziness       Brief Narrative (HPI from H&P)   Justin [name] is a 87 y.o. male with medical history significant of CAD status post CABG in 2009, PAD on anticoagulation, insulin-dependent type 2 diabetes with autonomic insufficiency and autonomic orthostatic hypotension, carotid stenosis, hyperlipidemia, CKD stage III, GERD, history of prostate cancer presented to the ED with complaints of dizziness/vertigo with symptoms about a few days ago.   Subjective:    [name] today has, No headache, No chest pain, No abdominal pain - No Nausea, No new weakness tingling or numbness, no SOB, does have spinning sensation upon getting up, ringing in his ears.   Assessment  & Plan :    Vertigo  -  Head CT and brain MRI negative for acute finding. EKG nonacute, orthostatics negative.  Does have bilateral carotid stenosis but Doppler done last month showing stable findings. Echo done 12/2020 showing normal EF, grade 1 diastolic dysfunction, and moderate grade 2 mitral regurgitation.  His symptoms are classic for vertigo with positive nystagmus on exam, he could have BPPV, placed on scheduled meclizine, low-dose benzodiazepine, vestibular PT and monitor.  We will check B12 and TSH as well.   CAD status post CABG in 2009 -Continue aspirin and statin.   PAD -Continue aspirin, Xarelto, and statin.   Insulin-dependent type 2 diabetes A1c 7.2 in 10/2020. -Sliding scale insulin moderate  ACHS.   CBG (last 3)  Recent Labs    06/14/21 2311 06/15/21 0740  GLUCAP 168* 208*    Diabetic neuropathy -Continue gabapentin   Hyperlipidemia -Continue Zetia and Zocor.   CKD stage III -Creatinine stable.   GERD -Continue PPI   BPH -Continue Flomax        Condition - Fair  Family Communication  :  None present  Code  Status :  Full  Consults  :  None  PUD Prophylaxis :    Procedures  :     CT & MRI Brain - non acute      Disposition Plan  :    Status is: Observation     Dispo: The patient is from: Home              Anticipated d/c is to: Home              Patient currently is not medically stable to d/c.   Difficult to place patient No  DVT Prophylaxis  :     rivaroxaban (XARELTO) tablet 2.5 mg    Lab Results  Component Value Date   PLT 198 06/14/2021    Diet :  Diet Order             Diet heart healthy/carb modified Room service appropriate? Yes; Fluid consistency: Thin  Diet effective now                    Inpatient Medications  Scheduled Meds:  aspirin EC  81 mg Oral Daily   carvedilol  6.25 mg Oral BID WC   clonazePAM  0.5 mg Oral BID   ezetimibe  10 mg Oral QPM   gabapentin  300 mg Oral BID   insulin aspart  0-15 Units Subcutaneous TID WC   insulin aspart  0-5 Units Subcutaneous QHS   insulin glargine-yfgn  25 Units Subcutaneous BID   linagliptin  5 mg Oral Daily   meclizine  25 mg Oral TID   pantoprazole  40 mg Oral Daily   rivaroxaban  2.5 mg Oral BID   simvastatin  20 mg Oral Daily   tamsulosin  0.4 mg Oral Daily   vitamin B-12  500 mcg Oral Daily   vitamin E  400 Units Oral Daily   Continuous Infusions: PRN Meds:.acetaminophen **OR** [DISCONTINUED] acetaminophen  Antibiotics  :    Anti-infectives (From admission, onward)    None        Time Spent in minutes  30   Lala Lund M.D on 06/15/2021 at 11:53 AM  To page go to [email or web address]   [facility] -  Office  [phone]     See  all Orders from today for further details    Objective:   Vitals:   06/15/21 0515 06/15/21 0607 06/15/21 0800 06/15/21 1058  BP: (!) 116/51 (!) 140/99 (!) 140/59 (!) 150/64  Pulse: 68 71 73 65  Resp: (!) 21 20 (!) 29 20  Temp:  98.6 F (37 C)    TempSrc:  Oral    SpO2: 95% 96% 97% 96%  Weight:      Height:        Wt Readings from Last 3 Encounters:  06/14/21 81.6 kg  04/02/21 90.7 kg  02/27/21 91.2 kg    No intake or output data in the 24 hours ending 06/15/21 1153   Physical Exam  Awake Alert, No new F.N deficits, Normal affect [name].AT,PERRAL, does have nystagmus towards the left lateral gaze Supple Neck,No JVD, No cervical lymphadenopathy appriciated.  Symmetrical Chest wall movement, Good air movement bilaterally, CTAB RRR,No Gallops,Rubs or new Murmurs, No Parasternal Heave +ve B.Sounds, Abd Soft, No tenderness, No organomegaly appriciated, No rebound - guarding or rigidity. No Cyanosis, Clubbing or edema, No new Rash or bruise       Data Review:    CBC Recent Labs  Lab 06/14/21 1314  WBC 8.6  HGB 11.0*  HCT 34.5*  PLT 198  MCV 89.8  MCH 28.6  MCHC 31.9  RDW 14.3  LYMPHSABS 1.2  MONOABS 0.7  EOSABS 0.1  BASOSABS 0.0    Recent Labs  Lab 06/14/21 1314  NA 136  K 4.3  CL 108  CO2 19*  GLUCOSE 216*  BUN 38*  CREATININE 1.91*  CALCIUM 9.2  AST 15  ALT 14  ALKPHOS 56  BILITOT 0.4  ALBUMIN 3.4*    ------------------------------------------------------------------------------------------------------------------ No results for input(s): CHOL, HDL, LDLCALC, TRIG, CHOLHDL, LDLDIRECT in the last 72 hours.  Lab Results  Component Value Date   HGBA1C 7.2 (H) 11/06/2020   ------------------------------------------------------------------------------------------------------------------ No results for input(s): TSH, T4TOTAL, T3FREE, THYROIDAB in the last 72 hours.  Invalid input(s): FREET3  Cardiac Enzymes No results for input(s): CKMB,  TROPONINI, MYOGLOBIN in the last 168 hours.  Invalid input(s): CK ------------------------------------------------------------------------------------------------------------------ No results found for: BNP  Micro Results No results found for this or any previous visit (from the past 240 hour(s)).  Radiology Reports DG Chest 2 View  Result Date: 06/14/2021 CLINICAL DATA:  Shortness of breath EXAM: CHEST - 2 VIEW COMPARISON:  12/14/2018 FINDINGS: Prior median sternotomy. Patient rotated right. The Chin overlies the apices. Midline trachea. Borderline cardiomegaly. No pleural effusion or pneumothorax. Clear lungs. Mild hyperinflation. IMPRESSION: No acute cardiopulmonary disease. Electronically Signed   By: Abigail Miyamoto M.D.   On: 06/14/2021 13:56   CT Head Wo Contrast  Result Date: 06/14/2021 CLINICAL DATA:  Dizziness EXAM: CT HEAD WITHOUT CONTRAST TECHNIQUE: Contiguous axial images were obtained from the base of the skull through the vertex without intravenous contrast. COMPARISON:  None. FINDINGS: Brain: There is no acute intracranial hemorrhage, extra-axial fluid collection, or acute infarct. There is mild parenchymal volume loss with commensurate enlargement of the ventricular system. There is a mild burden of chronic white matter microangiopathy. A densely calcified dural-based lesion overlying the left anterior frontal lobe likely reflects a meningioma. There is no other mass lesion. There is no midline shift. Vascular: There is calcification of the bilateral cavernous ICAs and vertebral arteries. Skull: Normal. Negative for fracture or focal lesion. Sinuses/Orbits: The imaged paranasal sinuses are clear. Bilateral lens implants are in place. The globes and orbits are otherwise unremarkable. Other: None. IMPRESSION: 1. No acute intracranial pathology. 2. Mild parenchymal volume loss and chronic white matter microangiopathy. Electronically Signed   By: Valetta Mole M.D.   On: 06/14/2021 14:21    MR BRAIN WO CONTRAST  Result Date: 06/15/2021 CLINICAL DATA:  Acute neurologic deficit EXAM: MRI HEAD WITHOUT CONTRAST TECHNIQUE: Multiplanar, multiecho pulse sequences of the brain and surrounding structures were obtained without intravenous contrast. COMPARISON:  None. FINDINGS: Brain: No acute infarct, mass effect or extra-axial collection. Focus of chronic microhemorrhage in the right cerebellar hemisphere. There is multifocal hyperintense T2-weighted signal within the white matter. Generalized volume loss without a clear lobar predilection. The midline structures are normal. Vascular: Major flow voids are preserved. Skull and upper cervical spine: Normal calvarium and skull base. Visualized upper cervical spine and soft tissues are normal. Sinuses/Orbits:No paranasal sinus fluid levels or advanced mucosal thickening. No mastoid or middle ear effusion. Normal orbits. IMPRESSION: 1. No acute intracranial abnormality. 2. Generalized volume loss and findings of chronic small vessel ischemia. Electronically Signed   By: Ulyses Jarred M.D.   On: 06/15/2021 00:22

## 2021-06-15 NOTE — ED Notes (Signed)
Pt provided with turkey sandwich and diet coke 

## 2021-06-16 ENCOUNTER — Observation Stay (HOSPITAL_BASED_OUTPATIENT_CLINIC_OR_DEPARTMENT_OTHER): Payer: Medicare HMO

## 2021-06-16 DIAGNOSIS — R55 Syncope and collapse: Secondary | ICD-10-CM | POA: Diagnosis not present

## 2021-06-16 LAB — COMPREHENSIVE METABOLIC PANEL
ALT: 14 U/L (ref 0–44)
AST: 17 U/L (ref 15–41)
Albumin: 2.9 g/dL — ABNORMAL LOW (ref 3.5–5.0)
Alkaline Phosphatase: 50 U/L (ref 38–126)
Anion gap: 9 (ref 5–15)
BUN: 29 mg/dL — ABNORMAL HIGH (ref 8–23)
CO2: 20 mmol/L — ABNORMAL LOW (ref 22–32)
Calcium: 9.1 mg/dL (ref 8.9–10.3)
Chloride: 108 mmol/L (ref 98–111)
Creatinine, Ser: 1.56 mg/dL — ABNORMAL HIGH (ref 0.61–1.24)
GFR, Estimated: 43 mL/min — ABNORMAL LOW (ref >60)
Glucose, Bld: 174 mg/dL — ABNORMAL HIGH (ref 70–99)
Potassium: 4.3 mmol/L (ref 3.5–5.1)
Sodium: 137 mmol/L (ref 135–145)
Total Bilirubin: 0.5 mg/dL (ref 0.3–1.2)
Total Protein: 6 g/dL — ABNORMAL LOW (ref 6.5–8.1)

## 2021-06-16 LAB — CBC WITH DIFFERENTIAL/PLATELET
Abs Immature Granulocytes: 0.06 10*3/uL (ref 0.00–0.07)
Basophils Absolute: 0.1 10*3/uL (ref 0.0–0.1)
Basophils Relative: 1 %
Eosinophils Absolute: 0.2 10*3/uL (ref 0.0–0.5)
Eosinophils Relative: 3 %
HCT: 34 % — ABNORMAL LOW (ref 39.0–52.0)
Hemoglobin: 11.3 g/dL — ABNORMAL LOW (ref 13.0–17.0)
Immature Granulocytes: 1 %
Lymphocytes Relative: 20 %
Lymphs Abs: 1.8 10*3/uL (ref 0.7–4.0)
MCH: 28.8 pg (ref 26.0–34.0)
MCHC: 33.2 g/dL (ref 30.0–36.0)
MCV: 86.7 fL (ref 80.0–100.0)
Monocytes Absolute: 0.9 10*3/uL (ref 0.1–1.0)
Monocytes Relative: 10 %
Neutro Abs: 6.1 10*3/uL (ref 1.7–7.7)
Neutrophils Relative %: 65 %
Platelets: 187 10*3/uL (ref 150–400)
RBC: 3.92 MIL/uL — ABNORMAL LOW (ref 4.22–5.81)
RDW: 14 % (ref 11.5–15.5)
WBC: 9.1 10*3/uL (ref 4.0–10.5)
nRBC: 0 % (ref 0.0–0.2)

## 2021-06-16 LAB — ECHOCARDIOGRAM COMPLETE
AR max vel: 1.95 cm2
AV Area VTI: 2.39 cm2
AV Area mean vel: 1.94 cm2
AV Mean grad: 2 mmHg
AV Peak grad: 4.2 mmHg
Ao pk vel: 1.02 m/s
Area-P 1/2: 1.62 cm2
Height: 67.5 in
MV M vel: 3.8 m/s
MV Peak grad: 57.8 mmHg
S' Lateral: 3 cm
Weight: 2880 oz

## 2021-06-16 LAB — GLUCOSE, CAPILLARY
Glucose-Capillary: 154 mg/dL — ABNORMAL HIGH (ref 70–99)
Glucose-Capillary: 154 mg/dL — ABNORMAL HIGH (ref 70–99)

## 2021-06-16 LAB — BRAIN NATRIURETIC PEPTIDE: B Natriuretic Peptide: 88.1 pg/mL (ref 0.0–100.0)

## 2021-06-16 LAB — MAGNESIUM: Magnesium: 1.8 mg/dL (ref 1.7–2.4)

## 2021-06-16 MED ORDER — MECLIZINE HCL 25 MG PO TABS: 25.0000 mg | ORAL_TABLET | Freq: Three times a day (TID) | ORAL | 0 refills | Status: DC | PRN

## 2021-06-16 MED ORDER — HYDRALAZINE HCL 50 MG PO TABS
50.0000 mg | ORAL_TABLET | Freq: Three times a day (TID) | ORAL | Status: DC
  Administered 2021-06-16: 50 mg via ORAL
  Filled 2021-06-16: qty 1

## 2021-06-16 NOTE — Discharge Summary (Signed)
Justin [name]:[id number] DOB: [birth year] DOA: 06/14/2021  PCP: [name], MD  Admit date: 06/14/2021  Discharge date: 06/16/2021  Admitted From: Home Disposition:  Home   Recommendations for Outpatient Follow-up:   Follow up with PCP in 1-2 weeks  PCP Please obtain BMP/CBC, 2 view CXR in 1week,  (see Discharge instructions)   PCP Please follow up on the following pending results: please follow the repeat Echo report -pending, unlikely to have an acute change from April 2022.   Home Health: PT, RN   Equipment/Devices: Walker  Consultations: None  Discharge Condition: Stable    CODE STATUS: Full    Diet Recommendation: Heart Healthy Low Carb  Diet Order             Diet heart healthy/carb modified Room service appropriate? Yes; Fluid consistency: Thin  Diet effective now                    Chief Complaint  Patient presents with   Dizziness     Brief history of present illness from the day of admission and additional interim summary    Justin [name] is a 87 y.o. male with medical history significant of CAD status post CABG in 2009, PAD on anticoagulation, insulin-dependent type 2 diabetes with autonomic insufficiency and autonomic orthostatic hypotension, carotid stenosis, hyperlipidemia, CKD stage III, GERD, history of prostate cancer presented to the ED with complaints of dizziness/vertigo with symptoms about a few days ago.                                                                 Hospital Course      Vertigo  -  Head CT and brain MRI negative for acute finding. EKG nonacute, orthostatics negative.  Does have bilateral carotid stenosis but Doppler done last month showing stable findings. Echo done 12/2020 showing normal EF, grade 1 diastolic dysfunction, and moderate grade 2  mitral regurgitation.  His symptoms are classic for vertigo with positive nystagmus on exam, he could have BPPV, placed on scheduled meclizine, low- home dose benzodiazepine, seen and cleared by PT, feels much better, had stable B12 and TSH, wil DC with PRN Meclizine, Walker, PT, and outpt ENT follow up.   CAD status post CABG in 2009 -Continue aspirin and statin.   PAD -Continue aspirin, Xarelto, and statin.   Insulin-dependent type 2 diabetes A1c 7.2 in 10/2020. -Sliding scale insulin moderate ACHS.  Diabetic neuropathy -Continue gabapentin   Hyperlipidemia -Continue Zetia and Zocor.   CKD stage III -Creatinine stable.   GERD -Continue PPI   BPH -Continue Flomax  Discharge diagnosis     Principal Problem:   Near syncope Active Problems:   Primary hypertension   GERD (gastroesophageal reflux disease)   CAD (coronary artery disease)  Dizziness    Discharge instructions    Discharge Instructions     Discharge instructions   Complete by: As directed    Follow with Primary MD [name], MD in 7 days   Get CBC, CMP  -  checked next visit within 1 week by Primary MD    Activity: As tolerated with Full fall precautions use walker/cane & assistance as needed  You must sit at a stationary position for 5 minutes and do leg extension exercises as taught for 5 minutes before you start walking from a resting position or after getting up from a bed, once you stand up , stand at that spot for 3-5 minutes while holding on to a wall-bed-heavy furniture and then walk only if you are not dizzy, using a  walker at all times, if you still get dizzy sit down, and call for help.  Disposition Home   Diet: Heart Healthy Low Carb  Special Instructions: If you have smoked or chewed Tobacco  in the last 2 yrs please stop smoking, stop any regular Alcohol  and or any Recreational drug use.  On your next visit with your primary care physician please Get Medicines reviewed and  adjusted.  Please request your Prim.MD to go over all Hospital Tests and Procedure/Radiological results at the follow up, please get all Hospital records sent to your Prim MD by signing hospital release before you go home.  If you experience worsening of your admission symptoms, develop shortness of breath, life threatening emergency, suicidal or homicidal thoughts you must seek medical attention immediately by calling 911 or calling your MD immediately  if symptoms less severe.  You Must read complete instructions/literature along with all the possible adverse reactions/side effects for all the Medicines you take and that have been prescribed to you. Take any new Medicines after you have completely understood and accpet all the possible adverse reactions/side effects.   Increase activity slowly   Complete by: As directed        Discharge Medications   Allergies as of 06/16/2021       Reactions   Contrast Media [iodinated Diagnostic Agents] Itching   PT STATES ALLERGY TO "CT DYE".   Amlodipine Swelling   Leg swelling   Dye Fdc Red [red Dye] Swelling   CAT scan dye   Ibuprofen Other (See Comments)   Upset stomach         Medication List     TAKE these medications    Accu-Chek Guide w/Device Kit   Accu-Chek SmartView test strip Generic drug: glucose blood   Accu-Chek Softclix Lancets lancets   acetaminophen 500 MG tablet Commonly known as: TYLENOL Take 1,000 mg by mouth 2 (two) times daily as needed for mild pain.   aspirin EC 81 MG tablet Take 81 mg by mouth daily.   B-6 PO Take 1 capsule by mouth daily.   cholecalciferol 25 MCG (1000 UNIT) tablet Commonly known as: VITAMIN D3 Take 1,000 Units by mouth daily.   clonazePAM 0.5 MG tablet Commonly known as: KLONOPIN Take 0.5 mg by mouth daily as needed for anxiety.   ezetimibe 10 MG tablet Commonly known as: ZETIA TAKE 1 TABLET DAILY AFTER SUPPER What changed: See the new instructions.   gabapentin 300 MG  capsule Commonly known as: NEURONTIN Take 300 mg by mouth 2 (two) times daily.   hydrALAZINE 50 MG tablet Commonly known as: APRESOLINE Take 1 tablet (50 mg total) by mouth 3 (three) times daily.   isosorbide  dinitrate 30 MG tablet Commonly known as: ISORDIL TAKE 1 TABLET THREE TIMES DAILY   Januvia 100 MG tablet Generic drug: sitaGLIPtin Take 100 mg by mouth daily.   ketoconazole 2 % cream Commonly known as: NIZORAL Apply 1 application topically daily.   labetalol 100 MG tablet Commonly known as: NORMODYNE Take 2 tablets (200 mg total) by mouth 2 (two) times daily.   Lantus SoloStar 100 UNIT/ML Solostar Pen Generic drug: insulin glargine Inject 15-50 Units into the skin See admin instructions. Inject 50 units in the morning and 15 units in the evening.   losartan 25 MG tablet Commonly known as: COZAAR Take 1 tablet (25 mg total) by mouth daily.   meclizine 25 MG tablet Commonly known as: ANTIVERT Take 1 tablet (25 mg total) by mouth 3 (three) times daily as needed for dizziness.   omeprazole 40 MG capsule Commonly known as: PRILOSEC Take 40 mg by mouth daily.   REFRESH OP Place 1 drop into both eyes 3 (three) times daily as needed (dry eyes).   simvastatin 20 MG tablet Commonly known as: ZOCOR Take 20 mg by mouth daily.   tamsulosin 0.4 MG Caps capsule Commonly known as: FLOMAX Take 0.4 mg by mouth daily.   vitamin B-12 500 MCG tablet Commonly known as: CYANOCOBALAMIN Take 500 mcg by mouth daily.   vitamin E 180 MG (400 UNITS) capsule Take 400 Units by mouth daily.   Xarelto 2.5 MG Tabs tablet Generic drug: rivaroxaban TAKE 1 TABLET TWICE DAILY What changed: how much to take               Durable Medical Equipment  (From admission, onward)           Start     Ordered   06/16/21 0801  For home use only DME Walker rolling  Once       Comments: 5 wheel  Question Answer Comment  Walker: With [place] Wheels   Patient needs a walker to  treat with the following condition Weakness      06/16/21 0800             Follow-up Information     [facility] Follow up.   Specialty: [place] Why: They will call you in 1-2 days to set up a time to come out to your home for physical therapy Contact information: [place] [address] [phone]         [name], MD. Schedule an appointment as soon as possible for a visit in 1 week(s).   Specialty: Internal Medicine Contact information: [place] [address] [phone]         Leta Baptist, MD. Schedule an appointment as soon as possible for a visit in 1 week(s).   Specialty: Otolaryngology Why: [facility] Contact information: [place] [address] [phone]                 Major procedures and Radiology Reports - PLEASE review detailed and final reports thoroughly  -       DG Chest 2 View  Result Date: 06/14/2021 CLINICAL DATA:  Shortness of breath EXAM: CHEST - 2 VIEW COMPARISON:  12/14/2018 FINDINGS: Prior median sternotomy. Patient rotated right. The Chin overlies the apices. Midline trachea. Borderline cardiomegaly. No pleural effusion or pneumothorax. Clear lungs. Mild hyperinflation. IMPRESSION: No acute cardiopulmonary disease. Electronically Signed   By: Abigail Miyamoto M.D.   On:  06/14/2021 13:56   CT Head Wo Contrast  Result Date: 06/14/2021 CLINICAL DATA:  Dizziness EXAM: CT HEAD WITHOUT CONTRAST TECHNIQUE: Contiguous axial images were obtained from the base of the skull through the vertex without intravenous contrast. COMPARISON:  None. FINDINGS: Brain: There is no acute intracranial hemorrhage, extra-axial fluid collection, or acute infarct. There is mild parenchymal volume loss with commensurate enlargement of the ventricular system. There is a mild burden of chronic white matter microangiopathy. A densely calcified dural-based lesion overlying the  left anterior frontal lobe likely reflects a meningioma. There is no other mass lesion. There is no midline shift. Vascular: There is calcification of the bilateral cavernous ICAs and vertebral arteries. Skull: Normal. Negative for fracture or focal lesion. Sinuses/Orbits: The imaged paranasal sinuses are clear. Bilateral lens implants are in place. The globes and orbits are otherwise unremarkable. Other: None. IMPRESSION: 1. No acute intracranial pathology. 2. Mild parenchymal volume loss and chronic white matter microangiopathy. Electronically Signed   By: Valetta Mole M.D.   On: 06/14/2021 14:21   MR BRAIN WO CONTRAST  Result Date: 06/15/2021 CLINICAL DATA:  Acute neurologic deficit EXAM: MRI HEAD WITHOUT CONTRAST TECHNIQUE: Multiplanar, multiecho pulse sequences of the brain and surrounding structures were obtained without intravenous contrast. COMPARISON:  None. FINDINGS: Brain: No acute infarct, mass effect or extra-axial collection. Focus of chronic microhemorrhage in the right cerebellar hemisphere. There is multifocal hyperintense T2-weighted signal within the white matter. Generalized volume loss without a clear lobar predilection. The midline structures are normal. Vascular: Major flow voids are preserved. Skull and upper cervical spine: Normal calvarium and skull base. Visualized upper cervical spine and soft tissues are normal. Sinuses/Orbits:No paranasal sinus fluid levels or advanced mucosal thickening. No mastoid or middle ear effusion. Normal orbits. IMPRESSION: 1. No acute intracranial abnormality. 2. Generalized volume loss and findings of chronic small vessel ischemia. Electronically Signed   By: Ulyses Jarred M.D.   On: 06/15/2021 00:22      Today   Subjective    Herberto Ledwell today has no headache,no chest abdominal pain,no new weakness tingling or numbness, feels much better wants to go home today.     Objective   Blood pressure (!) 183/77, pulse 77, temperature 99 F (37.2  C), temperature source Oral, resp. rate 20, height 5' 7.5" (1.715 m), weight 81.6 kg, SpO2 97 %.   Intake/Output Summary (Last 24 hours) at 06/16/2021 0935 Last data filed at 06/16/2021 0900 Gross per 24 hour  Intake 240 ml  Output 1700 ml  Net -1460 ml    Exam  Awake Alert, No new F.N deficits, Normal affect [place].AT,PERRAL, does have nystagmus towards the left lateral gaze -improved Supple Neck,No JVD, No cervical lymphadenopathy appriciated.  Symmetrical Chest wall movement, Good air movement bilaterally, CTAB RRR,No Gallops,Rubs or new Murmurs, No Parasternal Heave +ve B.Sounds, Abd Soft, No tenderness, No organomegaly appriciated, No rebound - guarding or rigidity. No Cyanosis, Clubbing or edema, No new Rash or bruise     Data Review   CBC w Diff:  Lab Results  Component Value Date   WBC 9.1 06/16/2021   HGB 11.3 (L) 06/16/2021   HGB 13.2 12/19/2019   HCT 34.0 (L) 06/16/2021   HCT 41.8 12/19/2019   PLT 187 06/16/2021   PLT 203 12/19/2019   LYMPHOPCT 20 06/16/2021   MONOPCT 10 06/16/2021   EOSPCT 3 06/16/2021   BASOPCT 1 06/16/2021    CMP:  Lab Results  Component Value Date   NA 137 06/16/2021  NA 138 03/14/2021   K 4.3 06/16/2021   CL 108 06/16/2021   CO2 20 (L) 06/16/2021   BUN 29 (H) 06/16/2021   BUN 34 (H) 03/14/2021   CREATININE 1.56 (H) 06/16/2021   CREATININE 1.63 (H) 09/30/2018   PROT 6.0 (L) 06/16/2021   PROT 7.1 03/21/2019   ALBUMIN 2.9 (L) 06/16/2021   ALBUMIN 4.1 03/21/2019   BILITOT 0.5 06/16/2021   BILITOT 0.3 03/21/2019   BILITOT 0.4 09/30/2018   ALKPHOS 50 06/16/2021   AST 17 06/16/2021   AST 17 09/30/2018   ALT 14 06/16/2021   ALT 17 09/30/2018  .   Total Time in preparing paper work, data evaluation and todays exam - 65 minutes  Lala Lund M.D on 06/16/2021 at 9:35 AM  [facility]

## 2021-06-16 NOTE — Discharge Instructions (Signed)
Follow with Primary MD [name], MD in 7 days   Get CBC, CMP  -  checked next visit within 1 week by Primary MD    Activity: As tolerated with Full fall precautions use walker/cane & assistance as needed  You must sit at a stationary position for 5 minutes and do leg extension exercises as taught for 5 minutes before you start walking from a resting position or after getting up from a bed, once you stand up , stand at that spot for 3-5 minutes while holding on to a wall-bed-heavy furniture and then walk only if you are not dizzy, using a  walker at all times, if you still get dizzy sit down, and call for help.  Disposition Home   Diet: Heart Healthy Low Carb  Special Instructions: If you have smoked or chewed Tobacco  in the last 2 yrs please stop smoking, stop any regular Alcohol  and or any Recreational drug use.  On your next visit with your primary care physician please Get Medicines reviewed and adjusted.  Please request your Prim.MD to go over all Hospital Tests and Procedure/Radiological results at the follow up, please get all Hospital records sent to your Prim MD by signing hospital release before you go home.  If you experience worsening of your admission symptoms, develop shortness of breath, life threatening emergency, suicidal or homicidal thoughts you must seek medical attention immediately by calling 911 or calling your MD immediately  if symptoms less severe.  You Must read complete instructions/literature along with all the possible adverse reactions/side effects for all the Medicines you take and that have been prescribed to you. Take any new Medicines after you have completely understood and accpet all the possible adverse reactions/side effects.

## 2021-06-16 NOTE — Progress Notes (Signed)
Pt given discharge instructions, prescriptions, and care notes. Pt verbalized understanding AEB no further questions or concerns at this time. IV was discontinued, no redness, pain, or swelling noted at this time. Telemetry discontinued and Centralized Telemetry was notified. Pt left the floor via wheelchair with staff in stable condition. 

## 2021-06-16 NOTE — TOC Transition Note (Signed)
Transition of Care [facility]) - CM/SW Discharge Note   Patient Details  Name: Justin [name] MRN: [id number] Date of Birth: 02/01/1933  [facility] CM/SW Contact:  Carles Collet, RN Phone Number: 06/16/2021, 9:02 AM   Clinical Narrative:    Damaris Schooner w patient over the phone. Discussed DC plan. He is agreeable to [facility] services. Recently had [facility] and would like to use them again. Referral accepted by liaison for vestibular PT and RN. No other TOC needs identified     Final next level of care: [place] Barriers to Discharge: No Barriers Identified   Patient Goals and CMS Choice Patient states their goals for this hospitalization and ongoing recovery are:: return home CMS Medicare.gov Compare Post Acute Care list provided to:: Patient Choice offered to / list presented to : Patient  Discharge Placement                       Discharge Plan and Services                DME Arranged: N/A         HH Arranged: PT, RN [place] Agency: [place] Date [facility] Agency Contacted: 06/16/21 Time [place]: 0902 Representative spoke with at [place]: [place] ([place]) Interventions     Readmission Risk Interventions No flowsheet data found.

## 2021-06-16 NOTE — Progress Notes (Signed)
Patient does not want to take Gabapentin. He states it makes his feet swell.

## 2021-06-16 NOTE — Care Management Obs Status (Signed)
[place] NOTIFICATION   Patient Details  Name: Justin [name] MRN: [id number] Date of Birth: 04-02-1933   Medicare Observation Status Notification Given:  Yes    Carles Collet, RN 06/16/2021, 9:00 AM

## 2021-06-16 NOTE — Progress Notes (Signed)
  Echocardiogram 2D Echocardiogram has been performed.  Justin [name] 06/16/2021, 10:10 AM

## 2021-06-19 ENCOUNTER — Encounter: Payer: Self-pay | Admitting: Cardiology

## 2021-06-24 DIAGNOSIS — R1013 Epigastric pain: Secondary | ICD-10-CM | POA: Diagnosis not present

## 2021-06-24 DIAGNOSIS — E1165 Type 2 diabetes mellitus with hyperglycemia: Secondary | ICD-10-CM | POA: Diagnosis not present

## 2021-06-24 DIAGNOSIS — I10 Essential (primary) hypertension: Secondary | ICD-10-CM | POA: Diagnosis not present

## 2021-06-24 DIAGNOSIS — E1142 Type 2 diabetes mellitus with diabetic polyneuropathy: Secondary | ICD-10-CM | POA: Diagnosis not present

## 2021-06-24 DIAGNOSIS — E782 Mixed hyperlipidemia: Secondary | ICD-10-CM | POA: Diagnosis not present

## 2021-06-24 DIAGNOSIS — R42 Dizziness and giddiness: Secondary | ICD-10-CM | POA: Diagnosis not present

## 2021-06-25 DIAGNOSIS — M48062 Spinal stenosis, lumbar region with neurogenic claudication: Secondary | ICD-10-CM | POA: Diagnosis not present

## 2021-07-02 NOTE — Progress Notes (Signed)
Primary Physician:  [name], MD   Patient ID: Justin [name], male    DOB: November 14, 1932, 87 y.o.   MRN: [id number]  Chief Complaint  Patient presents with   Hypertension   Follow-up   HPI:    Justin [name]  is a 87 y.o. male  male with history of CAD, S/P CABG in 2009, PAD, type II diabetes with autonomic insufficiency and autonomic orthostatic hypotension, asymptomatic carotid stenosis, and hyperlipidemia.  He has patent graft except occluded SVG to RCA which is diffusely diseased small vessel and also severe below-knee bilateral lower disease by angiography on 11/04/2016 and repeat peripheral arteriogram on 01/03/20 and has severe slow flow suggestive of microvascular disease.   Patient presents for 9-monthfollow-up of hypertension.  Patient was admitted 06/15/2021 - 06/16/2021 with dizziness, work-up was relatively unremarkable and suspect the patient has BPPV.  He has subsequently been referred to otolaryngology by PCP.  At last office visit stopped chlorthalidone and advised patient to follow-up with nephrology given worsening renal function, also increased labetalol from 100 mg twice daily to 200 mg twice daily and switch him to BiDil 1 tablet 3 times daily from isosorbide mononitrate. However, medication reconciliation today has revealed patient has continued to take chlorthalidone and is taking both metoprolol and labetalol, but not taking hydralazine.   Given the confusion, thorough medication reconciliation was performed with patient's daughter who was present at bedside.   Patient is otherwise doing well from a cardiovascular standpoint.  He continues to complain of dizziness, however orthostatics were negative during hospitalization.  He is scheduled to follow-up with neurology and ENT for further evaluation.  Patient denies chest pain, palpitations, syncope, near syncope, orthopnea, PND.  He is enrolled in remote patient monitoring.  Past Medical History:  Diagnosis Date    Anxiety    Arthritis    Carotid arterial disease ([place]    CKD (chronic kidney disease)    Coronary artery disease    Diabetes mellitus without complication (HCC)    Diabetic retinopathy [place]    NPDR OU   Diverticulitis    Dyspnea    GERD (gastroesophageal reflux disease)    History of kidney stones 2021   Hypercholesteremia    Hypertension    Hypertensive retinopathy    OU   Myocardial infarction ([facility] 2009   Pneumonia    as a child   Prostate cancer ([facility]    UTI (lower urinary tract infection)    Past Surgical History:  Procedure Laterality Date   ABDOMINAL SURGERY     for diverticulitis; also removed appendix   APPENDECTOMY     CARDIAC CATHETERIZATION  2018   CATARACT EXTRACTION     CATARACT EXTRACTION, BILATERAL     CHOLECYSTECTOMY N/A 10/12/2017   Procedure: LAPAROSCOPIC CHOLECYSTECTOMY;  Surgeon: BCoralie Keens MD;  [facility]  Service: General;  Laterality: N/A;   CORONARY ARTERY BYPASS GRAFT  2009   ERCP N/A 12/21/2018   Procedure: ENDOSCOPIC RETROGRADE CHOLANGIOPANCREATOGRAPHY (ERCP);  Surgeon: HCarol Ada MD;  [place] [facility]  Service: Endoscopy;  Laterality: N/A;   ESOPHAGOGASTRODUODENOSCOPY (EGD) WITH PROPOFOL N/A 12/17/2018   Procedure: ESOPHAGOGASTRODUODENOSCOPY (EGD) WITH PROPOFOL;  Surgeon: HCarol Ada MD;  [place]  Service: Endoscopy;  Laterality: N/A;   EUS Left 12/17/2018   Procedure: UPPER ENDOSCOPIC ULTRASOUND (EUS) LINEAR;  Surgeon: HCarol Ada MD;  [place] [facility]  Service: Endoscopy;  Laterality: Left;   EYE SURGERY     "for bleeding in eye"  HERNIA REPAIR     LEFT HEART CATH AND CORONARY ANGIOGRAPHY N/A 11/04/2016   Procedure: Left Heart Cath and Coronary Angiography;  Surgeon: [name], MD;  Location: [place] [facility];  Service: Cardiovascular;  Laterality: N/A;   LOWER EXTREMITY ANGIOGRAPHY N/A 11/04/2016   Procedure: Lower Extremity Angiography;  Surgeon: [name], MD;  Location: [place] CV  LAB;  Service: Cardiovascular;  Laterality: N/A;   LOWER EXTREMITY ANGIOGRAPHY N/A 01/03/2020   Procedure: LOWER EXTREMITY ANGIOGRAPHY;  Surgeon: [name], MD;  Location: [place] CV LAB;  Service: Cardiovascular;  Laterality: N/A;   LUMBAR LAMINECTOMY/DECOMPRESSION MICRODISCECTOMY Bilateral 11/09/2020   Procedure: Laminectomy and Foraminotomy - bilateral - Lumbar Four-Five.;  Surgeon: Earnie Larsson, MD;  Location: [place];  Service: Neurosurgery;  Laterality: Bilateral;  posterior   PROSTATE SURGERY     REMOVAL OF STONES  12/21/2018   Procedure: REMOVAL OF STONES;  Surgeon: [name], MD;  Location: [place];  Service: Endoscopy;;   SPHINCTEROTOMY  12/21/2018   Procedure: [name];  Surgeon: [name], MD;  Location: [facility] ENDOSCOPY;  Service: Endoscopy;;   Family History  Problem Relation Age of Onset   Diabetes Father    Diabetes Maternal Aunt    Diabetes Maternal Uncle    Diabetes Maternal Grandmother    Heart disease Sister    Diabetes Brother    Heart disease Sister    Social History   Tobacco Use   Smoking status: Former    Packs/day: 0.25    Years: 2.00    Pack years: 0.50    Types: Cigarettes   Smokeless tobacco: Never   Tobacco comments:    when he was a teenage age [age]  Substance Use Topics   Alcohol use: No    Marital Status: Widowed  ROS   Review of Systems  Constitutional: Negative for malaise/fatigue and weight gain.  Cardiovascular:  Positive for dyspnea on exertion (chronic, stable). Negative for chest pain, claudication, leg swelling, near-syncope, orthopnea, palpitations, paroxysmal nocturnal dyspnea and syncope.  Musculoskeletal:  Positive for back pain and muscle weakness.  Gastrointestinal:  Negative for melena.  Neurological:  Positive for dizziness.  All other systems reviewed and are negative.  Objective  Blood pressure (!) 120/48, pulse 61, temperature 98 F (36.7 C), temperature source Temporal, height 5' 7.5" (1.715 m), weight  183 lb 9.6 oz (83.3 kg), SpO2 100 %. Body mass index is 28.33 kg/m.  Vitals with BMI 07/03/2021 06/16/2021 06/16/2021  Height 5' 7.5" - -  Weight 183 lbs 10 oz - -  BMI 18.84 - -  Systolic 166 063 016  Diastolic 48 78 77  Pulse 61 - 77   Physical Exam Vitals reviewed.  Constitutional:      Appearance: He is well-developed.  Cardiovascular:     Rate and Rhythm: Normal rate and regular rhythm.     Pulses: Intact distal pulses.          Carotid pulses are  on the right side with bruit and  on the left side with bruit.      Femoral pulses are 2+ on the right side and 2+ on the left side.      Popliteal pulses are 1+ on the right side and 1+ on the left side.       Dorsalis pedis pulses are 0 on the right side and 0 on the left side.       Posterior tibial pulses are 0 on the right side and 0 on the  left side.     Heart sounds: Normal heart sounds, S1 normal and S2 normal. No murmur heard.   No gallop.     Comments: Split S2. NO JVD. Pulmonary:     Effort: Pulmonary effort is normal. No accessory muscle usage or respiratory distress.     Breath sounds: Normal breath sounds. No wheezing, rhonchi or rales.  Musculoskeletal:     Right lower leg: No edema.     Left lower leg: No edema.  Skin:    General: Skin is warm and dry.     Comments: No wounds or ulcers noted.  Neurological:     Mental Status: He is alert.   Laboratory examination:    CMP Latest Ref Rng & Units 06/16/2021 06/14/2021 03/14/2021  Glucose 70 - 99 mg/dL 174(H) 216(H) 160(H)  BUN 8 - 23 mg/dL 29(H) 38(H) 34(H)  Creatinine 0.61 - 1.24 mg/dL 1.56(H) 1.91(H) 2.14(H)  Sodium 135 - 145 mmol/L 137 136 138  Potassium 3.5 - 5.1 mmol/L 4.3 4.3 5.0  Chloride 98 - 111 mmol/L 108 108 109(H)  CO2 22 - 32 mmol/L 20(L) 19(L) 16(L)  Calcium 8.9 - 10.3 mg/dL 9.1 9.2 9.0  Total Protein 6.5 - 8.1 g/dL 6.0(L) 6.7 -  Total Bilirubin 0.3 - 1.2 mg/dL 0.5 0.4 -  Alkaline Phos 38 - 126 U/L 50 56 -  AST 15 - 41 U/L 17 15 -  ALT 0 - 44  U/L 14 14 -   CBC Latest Ref Rng & Units 06/16/2021 06/14/2021 03/10/2021  WBC 4.0 - 10.5 K/uL 9.1 8.6 7.0  Hemoglobin 13.0 - 17.0 g/dL 11.3(L) 11.0(L) 11.2(L)  Hematocrit 39.0 - 52.0 % 34.0(L) 34.5(L) 35.9(L)  Platelets 150 - 400 K/uL 187 198 159   Lipid Panel     Component Value Date/Time   CHOL 130 03/14/2021 0832   TRIG 104 03/14/2021 0832   HDL 49 03/14/2021 0832   LDLCALC 62 03/14/2021 0832   HEMOGLOBIN A1C Lab Results  Component Value Date   HGBA1C 7.2 (H) 11/06/2020   MPG 159.94 11/06/2020   TSH Recent Labs    06/15/21 1611  TSH 1.235     External labs:  Cholesterol, total 155.000 M 03/14/2019 HDL 61.000 M 03/14/2019 LDL 105 NHDL 94 Triglycerides 67.000 M 03/14/2019   A1C 7.100 % 06/08/2019; TSH 1.530 03/14/2019  Hemoglobin 12.600 G/ 07/04/2019; INR 1.300 12/14/2018 Platelets 198.000 X 07/04/2019  Creatinine, Serum 1.700 MG/ 07/04/2019 Potassium 4.100 03/21/2019 Magnesium 2.000 MG/ 09/17/2016 ALT (SGPT) 16.000 03/21/2019  BNP 46.800 PG 10/13/2016  Allergies   Allergies  Allergen Reactions   Contrast Media [Iodinated Diagnostic Agents] Itching    PT STATES ALLERGY TO "CT DYE".   Amlodipine Swelling    Leg swelling   Dye Fdc Red [Red Dye] Swelling    CAT scan dye   Ibuprofen Other (See Comments)    Upset stomach     Medications Prior to Visit:   Outpatient Medications Prior to Visit  Medication Sig Dispense Refill   ACCU-CHEK SMARTVIEW test strip      Accu-Chek Softclix Lancets lancets      acetaminophen (TYLENOL) 500 MG tablet Take 1,000 mg by mouth 2 (two) times daily as needed for mild pain.     aspirin EC 81 MG tablet Take 81 mg by mouth daily.     Blood Glucose Monitoring Suppl (ACCU-CHEK GUIDE) w/Device KIT      chlorthalidone (HYGROTON) 50 MG tablet Take 25 mg by mouth daily.  cholecalciferol (VITAMIN D3) 25 MCG (1000 UNIT) tablet Take 1,000 Units by mouth daily.     clonazePAM (KLONOPIN) 0.5 MG tablet Take 0.5 mg by mouth daily as needed for  anxiety.     DULoxetine (CYMBALTA) 30 MG capsule Take 30 mg by mouth 2 (two) times daily.     ezetimibe (ZETIA) 10 MG tablet TAKE 1 TABLET DAILY AFTER SUPPER (Patient taking differently: Take 10 mg by mouth every evening.) 90 tablet 3   hydrALAZINE (APRESOLINE) 50 MG tablet Take 1 tablet (50 mg total) by mouth 3 (three) times daily. 270 tablet 3   isosorbide dinitrate (ISORDIL) 30 MG tablet TAKE 1 TABLET THREE TIMES DAILY (Patient taking differently: Take 30 mg by mouth 2 (two) times daily.) 270 tablet 3   JANUVIA 100 MG tablet Take 100 mg by mouth daily.      ketoconazole (NIZORAL) 2 % cream Apply 1 application topically daily.     labetalol (NORMODYNE) 100 MG tablet Take 2 tablets (200 mg total) by mouth 2 (two) times daily. 180 tablet 3   LANTUS SOLOSTAR 100 UNIT/ML Solostar Pen Inject 15-50 Units into the skin See admin instructions. Inject 50 units in the morning and 15 units in the evening.     losartan (COZAAR) 25 MG tablet Take 1 tablet (25 mg total) by mouth daily. 90 tablet 3   meclizine (ANTIVERT) 25 MG tablet Take 1 tablet (25 mg total) by mouth 3 (three) times daily as needed for dizziness. 30 tablet 0   metoprolol tartrate (LOPRESSOR) 50 MG tablet Take 50 mg by mouth 2 (two) times daily.     omeprazole (PRILOSEC) 40 MG capsule Take 40 mg by mouth daily.      Polyvinyl Alcohol-Povidone (REFRESH OP) Place 1 drop into both eyes 3 (three) times daily as needed (dry eyes).     Pyridoxine HCl (B-6 PO) Take 1 capsule by mouth daily.     simvastatin (ZOCOR) 20 MG tablet Take 20 mg by mouth daily.     tamsulosin (FLOMAX) 0.4 MG CAPS capsule Take 0.4 mg by mouth daily.     vitamin B-12 (CYANOCOBALAMIN) 500 MCG tablet Take 500 mcg by mouth daily.     vitamin E 180 MG (400 UNITS) capsule Take 400 Units by mouth daily.     XARELTO 2.5 MG TABS tablet TAKE 1 TABLET TWICE DAILY (Patient taking differently: Take 2.5 mg by mouth 2 (two) times daily.) 180 tablet 3   gabapentin (NEURONTIN) 300 MG  capsule Take 300 mg by mouth 2 (two) times daily.     No facility-administered medications prior to visit.   Final Medications at End of Visit    Current Meds  Medication Sig   ACCU-CHEK SMARTVIEW test strip    Accu-Chek Softclix Lancets lancets    acetaminophen (TYLENOL) 500 MG tablet Take 1,000 mg by mouth 2 (two) times daily as needed for mild pain.   aspirin EC 81 MG tablet Take 81 mg by mouth daily.   Blood Glucose Monitoring Suppl (ACCU-CHEK GUIDE) w/Device KIT    chlorthalidone (HYGROTON) 50 MG tablet Take 25 mg by mouth daily.   cholecalciferol (VITAMIN D3) 25 MCG (1000 UNIT) tablet Take 1,000 Units by mouth daily.   clonazePAM (KLONOPIN) 0.5 MG tablet Take 0.5 mg by mouth daily as needed for anxiety.   DULoxetine (CYMBALTA) 30 MG capsule Take 30 mg by mouth 2 (two) times daily.   ezetimibe (ZETIA) 10 MG tablet TAKE 1 TABLET DAILY AFTER SUPPER (Patient taking differently:  Take 10 mg by mouth every evening.)   hydrALAZINE (APRESOLINE) 50 MG tablet Take 1 tablet (50 mg total) by mouth 3 (three) times daily.   isosorbide dinitrate (ISORDIL) 30 MG tablet TAKE 1 TABLET THREE TIMES DAILY (Patient taking differently: Take 30 mg by mouth 2 (two) times daily.)   JANUVIA 100 MG tablet Take 100 mg by mouth daily.    ketoconazole (NIZORAL) 2 % cream Apply 1 application topically daily.   labetalol (NORMODYNE) 100 MG tablet Take 2 tablets (200 mg total) by mouth 2 (two) times daily.   LANTUS SOLOSTAR 100 UNIT/ML Solostar Pen Inject 15-50 Units into the skin See admin instructions. Inject 50 units in the morning and 15 units in the evening.   losartan (COZAAR) 25 MG tablet Take 1 tablet (25 mg total) by mouth daily.   meclizine (ANTIVERT) 25 MG tablet Take 1 tablet (25 mg total) by mouth 3 (three) times daily as needed for dizziness.   metoprolol tartrate (LOPRESSOR) 50 MG tablet Take 50 mg by mouth 2 (two) times daily.   omeprazole (PRILOSEC) 40 MG capsule Take 40 mg by mouth daily.     Polyvinyl Alcohol-Povidone (REFRESH OP) Place 1 drop into both eyes 3 (three) times daily as needed (dry eyes).   Pyridoxine HCl (B-6 PO) Take 1 capsule by mouth daily.   simvastatin (ZOCOR) 20 MG tablet Take 20 mg by mouth daily.   tamsulosin (FLOMAX) 0.4 MG CAPS capsule Take 0.4 mg by mouth daily.   vitamin B-12 (CYANOCOBALAMIN) 500 MCG tablet Take 500 mcg by mouth daily.   vitamin E 180 MG (400 UNITS) capsule Take 400 Units by mouth daily.   XARELTO 2.5 MG TABS tablet TAKE 1 TABLET TWICE DAILY (Patient taking differently: Take 2.5 mg by mouth 2 (two) times daily.)   Radiology:   No results found.  Cardiac Studies:   Lexiscan myoview stress test 04/17/2016: 1. Resting EKG demonstrates normal sinus rhythm, left axis deviation, right bundle branch block.  Stress EKG is nondiagnostic for ischemia as a pharmacologic stress test.  There are frequent PACs during the stress test.  Stress symptoms included dyspnea. 2. The Perfusion images reveal the left ventricle to be mildly dilated at 132 mL both in rest and stress images. There is a moderate-sized inferior and inferoapical scar with very mild peri-infarct ischemia noted especially towards the apex.  Left ventricle systolic function was moderate to severely depressed at 36%. 3. This is an intermediate risk study, clinical correlation recommended.    Coronary angiogram 11/04/2016: Native RCA small and diffusely diseased with distal 70-80% stenosis. Occluded SVG to RCA. Patent SVG to OM-RI, LIMA to LAD. Medical therapy.  Lower Extremity Arterial Duplex 11/30/2019:  No hemodynamically significant stenosis in bilateral lower extremity above  the knee.  Moderate velocity increase at the right mid posterior tibial artery.  suggests >50%stenosis.  Diffuse small vessel disease with moderately abnormal waveform right ankle  and severely abnormal waveform left ankle.  Non compressible vessels bilateral at the ankles suggests medial  calcinosis.    No  significant change from 05/02/2019.  Peripheral arteriogram 01/03/2020: Distal abdominal aortogram with bifemoral arteriogram reveals widely patent iliac vessels and common femoral vessels.  Left SFA and left popliteal artery are widely patent with diffuse arterial sclerosis.  No luminal irregularity.  Below the left knee, two-vessel runoff in the form of PT and peroneal artery.  There is diffuse disease noted in both the peroneal and PT.  AT is occluded. Similarly on the right  no significant disease in the SFA and there is probably a two-vessel runoff in the form of peroneal artery and PT.  AT is occluded. Recommendation: Patient has very slow flow suggestive of microvascular disease and also small vessel disease.  Unless critical limb ischemia no reason for angioplasty, continue medical therapy.  50 mill contrast utilized.  PCV MYOCARDIAL PERFUSION WITH LEXISCAN 09/24/2020 Lexiscan nuclear stress test performed using 1-day protocol. SPECT images showed small sized, mild intensity, mildly reversible perfusion defect in apical to basal inferior myocardium. Stress LVEF 53%. Low risk study.  ABI 02/11/2021: Right PT monophasic and non-compressible, DP biphasic and noncompressible.  TBI 0.59.  Right great toe pressure 102 mmHg. Left PT monophasic and noncompressible, DP monophasic and noncompressible.  TBI 0.42.  Left great toe pressure 73 mmHg. Bilateral TBI abnormal. Bilateral TBI suggest appropriate pressure for wound healing.   Patient has had peripheral arteriogram on 01/03/2020 revealing widely patent large vessels and small vessel disease.  In the absence of open wounds or nonhealing foot ulcer and absence of rest pain, recommend medical therapy.  Carotid artery duplex 05/14/2021:  Duplex suggests stenosis in the right internal carotid artery (50-69%).  The right PSV internal/common carotid artery ratio of 4.78 is consistent  with a stenosis of >70%. Duplex suggests stenosis in the right  external  carotid artery (<50%).  Duplex suggests stenosis in the left internal carotid artery (1-15%).  Doppler flow velocities in the left carotid bifurcation are consistent  with stenosis in the range of <50% with mild heterogeneous plaque. Duplex  suggests stenosis in the left external carotid artery (<50%).  Antegrade right vertebral artery flow. Antegrade left vertebral artery  flow.  No significant change from 11/07/2020. Follow up in six months is appropriate if clinically indicated.  Echocardiogram 06/15/2021: 1. Left ventricular ejection fraction, by estimation, is 60 to 65%. The  left ventricle has normal function. The left ventricle has no regional  wall motion abnormalities. There is severe left ventricular hypertrophy.  Left ventricular diastolic parameters   are consistent with Grade I diastolic dysfunction (impaired relaxation).   2. Right ventricular systolic function is normal. The right ventricular  size is normal.   3. Left atrial size was mildly dilated.   4. The mitral valve is abnormal. Mild to moderate mitral valve  regurgitation.   5. The aortic valve is tricuspid. Aortic valve regurgitation is not  visualized. Mild aortic valve sclerosis is present, with no evidence of  aortic valve stenosis.   6. The inferior vena cava is normal in size with greater than 50%  respiratory variability, suggesting right atrial pressure of 3 mmHg.   Comparison(s): No significant change from prior study. 01/11/2021: LVEF  60-65%, moderate LVH, grade 1 DD, moderate MR.   EKG  04/02/2021: Sinus rhythm with borderline first-degree AV block and single PVC at rate of 64 bpm. Left axis deviation, left anterior fascicular block.  Right bundle branch block.   EKG 10/02/2020: Sinus bradycardia at a rate of 54 bpm with borderline first-degree AV block. Left axis deviation, left anterior fascicular block.  Right bundle branch block.  Nonspecific T wave abnormality.  Compared to EKG 11/14/2019,  bradycardia new.  EKG 11/14/2019: Normal sinus rhythm at 74 bpm with 1 PAC, left axis deviation, left anterior fasicular block, RBBB. Nonspecific T wave abnormality.  Assessment:     ICD-10-CM   1. Primary hypertension  I10     2. Claudication in peripheral vascular disease (HCC)  I73.9     3.  Hypercholesteremia  E78.00       No orders of the defined types were placed in this encounter.  There are no discontinued medications.   Recommendations:   Ozell Juhasz  is a 87 y.o. male with history of CAD, S/P CABG in 2009, PAD, type II diabetes with autonomic insufficiency and autonomic orthostatic hypotension, asymptomatic carotid stenosis, and hyperlipidemia.  He has patent graft except occluded SVG to RCA which is diffusely diseased small vessel and also severe below-knee bilateral lower disease by angiography on 11/04/2016 and repeat peripheral arteriogram on 01/03/20 and has severe slow flow suggestive of microvascular disease.    Patient presents for 26-monthfollow-up of hypertension.  Patient was admitted 06/15/2021 - 06/16/2021 with dizziness, work-up was relatively unremarkable and suspect the patient has BPPV.  He has subsequently been referred to otolaryngology by PCP.  At last office visit stop chlorthalidone and advised patient to follow-up with nephrology given worsening renal function, also increase labetalol from 100 mg twice daily to 200 mg twice daily and switch him to BiDil 1 tablet 3 times daily from isosorbide mononitrate.  Patient was confused and taking wrong medications, therefore there will medication reconciliation was done with patient and his daughter who is present at bedside.  We will again discontinue metoprolol and chlorthalidone, patient will resume hydralazine.  Advised that he should continue labetalol, Isordil, losartan. We will also avoid calcium channel blockers as patient has not tolerated amlodipine in the past.   Reviewed and discussed with patient and his  daughter regarding echocardiogram and carotid artery duplex, results above.  Echocardiogram and carotid status remain stable.  Discussed with patient option of discontinuing carotid artery surveillance at this time given that it has remained stable and his advanced age, patient preference is to continue with surveillance, will therefore repeat carotid artery duplex in 6 months.  We will continue to monitor blood pressure remotely.  In regard to PAD, in the absence of multiple wounds or nonhealing ulcers recommend continued medical management graduated exercise.  In regard to dizziness will defer further evaluation management to neurology and ENT as do not suspect cardiovascular etiology.  Follow-up in 6 months, sooner if needed.   CAlethia Berthold PA-C 07/03/2021, 11:05 AM Office: [phone]

## 2021-07-03 ENCOUNTER — Ambulatory Visit: Payer: Medicare HMO | Admitting: Student

## 2021-07-03 ENCOUNTER — Other Ambulatory Visit: Payer: Self-pay

## 2021-07-03 ENCOUNTER — Encounter: Payer: Self-pay | Admitting: Student

## 2021-07-03 VITALS — BP 120/48 | HR 61 | Temp 98.0°F | Ht 67.5 in | Wt 183.6 lb

## 2021-07-03 DIAGNOSIS — E7800 Pure hypercholesterolemia, unspecified: Secondary | ICD-10-CM

## 2021-07-03 DIAGNOSIS — I739 Peripheral vascular disease, unspecified: Secondary | ICD-10-CM | POA: Diagnosis not present

## 2021-07-03 DIAGNOSIS — I10 Essential (primary) hypertension: Secondary | ICD-10-CM

## 2021-07-08 DIAGNOSIS — I129 Hypertensive chronic kidney disease with stage 1 through stage 4 chronic kidney disease, or unspecified chronic kidney disease: Secondary | ICD-10-CM | POA: Diagnosis not present

## 2021-07-08 DIAGNOSIS — D631 Anemia in chronic kidney disease: Secondary | ICD-10-CM | POA: Diagnosis not present

## 2021-07-08 DIAGNOSIS — N183 Chronic kidney disease, stage 3 unspecified: Secondary | ICD-10-CM | POA: Diagnosis not present

## 2021-07-08 DIAGNOSIS — N2581 Secondary hyperparathyroidism of renal origin: Secondary | ICD-10-CM | POA: Diagnosis not present

## 2021-07-09 ENCOUNTER — Encounter (INDEPENDENT_AMBULATORY_CARE_PROVIDER_SITE_OTHER): Payer: Medicare HMO | Admitting: Ophthalmology

## 2021-07-09 DIAGNOSIS — Z961 Presence of intraocular lens: Secondary | ICD-10-CM

## 2021-07-09 DIAGNOSIS — H35033 Hypertensive retinopathy, bilateral: Secondary | ICD-10-CM

## 2021-07-09 DIAGNOSIS — I10 Essential (primary) hypertension: Secondary | ICD-10-CM

## 2021-07-09 DIAGNOSIS — H3581 Retinal edema: Secondary | ICD-10-CM

## 2021-07-09 DIAGNOSIS — E113313 Type 2 diabetes mellitus with moderate nonproliferative diabetic retinopathy with macular edema, bilateral: Secondary | ICD-10-CM

## 2021-07-16 ENCOUNTER — Other Ambulatory Visit: Payer: Self-pay | Admitting: Pharmacist

## 2021-07-16 DIAGNOSIS — H903 Sensorineural hearing loss, bilateral: Secondary | ICD-10-CM | POA: Diagnosis not present

## 2021-07-16 DIAGNOSIS — R42 Dizziness and giddiness: Secondary | ICD-10-CM | POA: Diagnosis not present

## 2021-07-18 DIAGNOSIS — B351 Tinea unguium: Secondary | ICD-10-CM | POA: Diagnosis not present

## 2021-07-18 DIAGNOSIS — E1151 Type 2 diabetes mellitus with diabetic peripheral angiopathy without gangrene: Secondary | ICD-10-CM | POA: Diagnosis not present

## 2021-07-18 DIAGNOSIS — L603 Nail dystrophy: Secondary | ICD-10-CM | POA: Diagnosis not present

## 2021-07-18 DIAGNOSIS — L84 Corns and callosities: Secondary | ICD-10-CM | POA: Diagnosis not present

## 2021-07-22 DIAGNOSIS — I129 Hypertensive chronic kidney disease with stage 1 through stage 4 chronic kidney disease, or unspecified chronic kidney disease: Secondary | ICD-10-CM | POA: Diagnosis not present

## 2021-07-25 DIAGNOSIS — M48062 Spinal stenosis, lumbar region with neurogenic claudication: Secondary | ICD-10-CM | POA: Diagnosis not present

## 2021-08-02 ENCOUNTER — Other Ambulatory Visit: Payer: Self-pay

## 2021-08-02 ENCOUNTER — Telehealth: Payer: Self-pay | Admitting: Cardiology

## 2021-08-02 MED ORDER — LABETALOL HCL 100 MG PO TABS: 200.0000 mg | ORAL_TABLET | Freq: Two times a day (BID) | ORAL | 3 refills | Status: DC

## 2021-08-03 DIAGNOSIS — I10 Essential (primary) hypertension: Secondary | ICD-10-CM | POA: Diagnosis not present

## 2021-08-07 NOTE — Progress Notes (Signed)
[facility] Note  08/12/2021     CHIEF COMPLAINT Patient presents for Retina Follow Up   HISTORY OF PRESENT ILLNESS: Justin [name] is a 87 y.o. male who presents to the clinic today for:   HPI     Retina Follow Up   Patient presents with  Diabetic Retinopathy.  In both eyes.  Severity is moderate.  Duration of 10 weeks.  Since onset it is gradually worsening.  I, the attending physician,  performed the HPI with the patient and updated documentation appropriately.        Comments   10 week retina follow up for NPDR ou. Patient states he had vertigo about a month ago and he feels like it affected his vision. Patient blood sugar is 150      Last edited by [name], MD on 08/12/2021  9:56 PM.     Pt is delayed to follow up from 8 weeks to 14 due to being in the hospital a couple times since last visit with vertigo, he is also having trouble with his legs and feet with neuropathy, he feels like his vision is doing pretty good  Referring physician: Gevena Cotton, MD [address] [place],  [place] [address]  HISTORICAL INFORMATION:   Selected notes from the [place] Referred by Dr. [name] for concern of mac edema OU   CURRENT MEDICATIONS: Current Outpatient Medications (Ophthalmic Drugs)  Medication Sig   Polyvinyl Alcohol-Povidone (REFRESH OP) Place 1 drop into both eyes 3 (three) times daily as needed (dry eyes).   No current facility-administered medications for this visit. (Ophthalmic Drugs)   Current Outpatient Medications (Other)  Medication Sig   ACCU-CHEK SMARTVIEW test strip    Accu-Chek Softclix Lancets lancets    acetaminophen (TYLENOL) 500 MG tablet Take 1,000 mg by mouth 2 (two) times daily as needed for mild pain.   aspirin EC 81 MG tablet Take 81 mg by mouth daily.   Blood Glucose Monitoring Suppl (ACCU-CHEK GUIDE) w/Device KIT    cholecalciferol (VITAMIN D3) 25 MCG (1000 UNIT) tablet  Take 1,000 Units by mouth daily.   clonazePAM (KLONOPIN) 0.5 MG tablet Take 0.5 mg by mouth daily as needed for anxiety.   DULoxetine (CYMBALTA) 30 MG capsule Take 30 mg by mouth 2 (two) times daily.   ezetimibe (ZETIA) 10 MG tablet TAKE 1 TABLET DAILY AFTER SUPPER (Patient taking differently: Take 10 mg by mouth every evening.)   gabapentin (NEURONTIN) 300 MG capsule Take 300 mg by mouth 2 (two) times daily.   hydrALAZINE (APRESOLINE) 50 MG tablet Take 1 tablet (50 mg total) by mouth 3 (three) times daily.   isosorbide dinitrate (ISORDIL) 30 MG tablet TAKE 1 TABLET THREE TIMES DAILY (Patient taking differently: Take 30 mg by mouth 2 (two) times daily.)   JANUVIA 100 MG tablet Take 100 mg by mouth daily.    ketoconazole (NIZORAL) 2 % cream Apply 1 application topically daily.   labetalol (NORMODYNE) 100 MG tablet Take 2 tablets (200 mg total) by mouth 2 (two) times daily.   LANTUS SOLOSTAR 100 UNIT/ML Solostar Pen Inject 15-50 Units into the skin See admin instructions. Inject 50 units in the morning and 15 units in the evening.   losartan (COZAAR) 25 MG tablet Take 1 tablet (25 mg total) by mouth daily.   meclizine (ANTIVERT) 25 MG tablet Take 1 tablet (25 mg total) by mouth 3 (three) times daily as needed for dizziness.  omeprazole (PRILOSEC) 40 MG capsule Take 40 mg by mouth daily.    Pyridoxine HCl (B-6 PO) Take 1 capsule by mouth daily.   simvastatin (ZOCOR) 20 MG tablet Take 20 mg by mouth daily.   tamsulosin (FLOMAX) 0.4 MG CAPS capsule Take 0.4 mg by mouth daily.   vitamin B-12 (CYANOCOBALAMIN) 500 MCG tablet Take 500 mcg by mouth daily.   vitamin E 180 MG (400 UNITS) capsule Take 400 Units by mouth daily.   XARELTO 2.5 MG TABS tablet TAKE 1 TABLET TWICE DAILY (Patient taking differently: Take 2.5 mg by mouth 2 (two) times daily.)   No current facility-administered medications for this visit. (Other)      REVIEW OF SYSTEMS: ROS   Positive for: Gastrointestinal, Genitourinary,  Endocrine, Cardiovascular, Eyes Negative for: Constitutional, Neurological, Skin, Musculoskeletal, HENT, Respiratory, Psychiatric, Allergic/Imm, Heme/Lymph Last edited by Elmore Guise, COT on 08/12/2021  9:10 AM.         ALLERGIES Allergies  Allergen Reactions   Contrast Media [Iodinated Diagnostic Agents] Itching    PT STATES ALLERGY TO "CT DYE".   Amlodipine Swelling    Leg swelling   Dye Fdc Red [Red Dye] Swelling    CAT scan dye   Ibuprofen Other (See Comments)    Upset stomach     PAST MEDICAL HISTORY Past Medical History:  Diagnosis Date   Anxiety    Arthritis    Carotid arterial disease ([place])    CKD (chronic kidney disease)    Coronary artery disease    Diabetes mellitus without complication (HCC)    Diabetic retinopathy ([place])    NPDR OU   Diverticulitis    Dyspnea    GERD (gastroesophageal reflux disease)    History of kidney stones 2021   Hypercholesteremia    Hypertension    Hypertensive retinopathy    OU   Myocardial infarction [facility]) 2009   Pneumonia    as a child   Prostate cancer ([place])    UTI (lower urinary tract infection)    Past Surgical History:  Procedure Laterality Date   ABDOMINAL SURGERY     for diverticulitis; also removed appendix   APPENDECTOMY     CARDIAC CATHETERIZATION  2018   CATARACT EXTRACTION     CATARACT EXTRACTION, BILATERAL     CHOLECYSTECTOMY N/A 10/12/2017   Procedure: LAPAROSCOPIC CHOLECYSTECTOMY;  Surgeon: [name], MD;  Location: [facility];  Service: General;  Laterality: N/A;   CORONARY ARTERY BYPASS GRAFT  2009   ERCP N/A 12/21/2018   Procedure: ENDOSCOPIC RETROGRADE CHOLANGIOPANCREATOGRAPHY (ERCP);  Surgeon: [name], MD;  Location: [place];  Service: Endoscopy;  Laterality: N/A;   ESOPHAGOGASTRODUODENOSCOPY (EGD) WITH PROPOFOL N/A 12/17/2018   Procedure: ESOPHAGOGASTRODUODENOSCOPY (EGD) WITH PROPOFOL;  Surgeon: [name], MD;  Location: [place];  Service: Endoscopy;  Laterality: N/A;   EUS  Left 12/17/2018   Procedure: UPPER ENDOSCOPIC ULTRASOUND (EUS) LINEAR;  Surgeon: [name], MD;  Location: [facility];  Service: Endoscopy;  Laterality: Left;   EYE SURGERY     "for bleeding in eye"   HERNIA REPAIR     LEFT HEART CATH AND CORONARY ANGIOGRAPHY N/A 11/04/2016   Procedure: Left Heart Cath and Coronary Angiography;  Surgeon: [name], MD;  Location: [facility] [place] [facility];  Service: Cardiovascular;  Laterality: N/A;   LOWER EXTREMITY ANGIOGRAPHY N/A 11/04/2016   Procedure: Lower Extremity Angiography;  Surgeon: [name], MD;  Location: [place] [facility];  Service: Cardiovascular;  Laterality: N/A;   LOWER EXTREMITY ANGIOGRAPHY  N/A 01/03/2020   Procedure: LOWER EXTREMITY ANGIOGRAPHY;  Surgeon: [name], MD;  Location: [place] [facility];  Service: Cardiovascular;  Laterality: N/A;   LUMBAR LAMINECTOMY/DECOMPRESSION MICRODISCECTOMY Bilateral 11/09/2020   Procedure: Laminectomy and Foraminotomy - bilateral - Lumbar Four-Five.;  Surgeon: Earnie Larsson, MD;  Location: [place];  Service: Neurosurgery;  Laterality: Bilateral;  posterior   PROSTATE SURGERY     REMOVAL OF STONES  12/21/2018   Procedure: REMOVAL OF STONES;  Surgeon: [name], MD;  Location: [facility];  Service: Endoscopy;;   SPHINCTEROTOMY  12/21/2018   Procedure: [name];  Surgeon: [name], MD;  Location: [facility] ENDOSCOPY;  Service: Endoscopy;;    FAMILY HISTORY Family History  Problem Relation Age of Onset   Diabetes Father    Diabetes Maternal Aunt    Diabetes Maternal Uncle    Diabetes Maternal Grandmother    Heart disease Sister    Diabetes Brother    Heart disease Sister     SOCIAL HISTORY Social History   Tobacco Use   Smoking status: Former    Packs/day: 0.25    Years: 2.00    Pack years: 0.50    Types: Cigarettes   Smokeless tobacco: Never   Tobacco comments:    when he was a teenage age [age]  Vaping Use   Vaping Use: Never used  Substance Use Topics   Alcohol use: No   Drug use:  No       OPHTHALMIC EXAM: Base Eye Exam     Visual Acuity (Snellen - Linear)       Right Left   Dist cc 20/25-2 20/25-3   Dist ph cc 20/NI 20/NI    Correction: Glasses         Tonometry (Tonopen, 9:13 AM)       Right Left   Pressure 12 13         Pupils       Dark Light Shape React APD   Right 3 2 Round Brisk None   Left 3 2 Round Brisk None         Visual Fields (Counting fingers)       Left Right    Full Full         Extraocular Movement       Right Left    Full, Ortho Full, Ortho         Neuro/Psych     Oriented x3: Yes   Mood/Affect: Normal         Dilation     Both eyes: 1.0% Mydriacyl, 2.5% Phenylephrine @ 9:13 AM           Slit Lamp and Fundus Exam     Slit Lamp Exam       Right Left   Lids/Lashes Dermatochalasis - upper lid, Meibomian gland dysfunction Dermatochalasis - upper lid, Meibomian gland dysfunction   Conjunctiva/Sclera Melanosis Melanosis   Cornea Arcus, trace Punctate epithelial erosions, mild endopigment Arcus, 1-2+ Punctate epithelial erosions, trace endopigment, mild corneal haze   Anterior Chamber Deep and quiet Deep and quiet   Iris Mild Temporal Iris atrophy, Round and dilated, No NVI Mild Temporal Iris atrophy, Round and dilated, No NVI   Lens PC IOL in good position with mild aterior capsule fimosis PC IOL in good position with mild aterior capsule fimosis   Vitreous Vitreous syneresis, Posterior vitreous detachment, vitreous condensations Vitreous syneresis, Posterior vitreous detachment, vitreous condensations         Fundus Exam  Right Left   Disc pallor with sharp rim, Peripapillary atrophy, Compact mild pallor, sharp rim, Tilted disc, mild temporal Peripapillary atrophy, Compact   C/D Ratio 0.2 0.3   Macula Blunted foveal reflex, Retinal pigment epithelial mottling and clumping, Microaneurysms -- improved, persistent central cystic changes -- slightly increased Blunted foveal reflex, focal  area of RPE atrophy SN to fovea, Microaneurysms, Retinal pigment epithelial mottling, refractile Epiretinal membrane, central cystic changes - persistent   Vessels Vascular attenuation, Tortuous, AV crossing changes, mild Copper wiring attenuated, Tortuous   Periphery Attached, rare, scattered MA, good peripheral PRP 360 - room for posterior fill in if needed Attached, rare, scattered MA, Peripheral PRP 360 -- room for posterior fill in if needed, focal, white-centered IRH IN to disc           Refraction     Wearing Rx       Sphere Cylinder Axis Add   Right -0.75 +1.75 175 +3.00   Left -0.25 +1.50 180 +3.00    Type: Bifocal            IMAGING AND PROCEDURES  Imaging and Procedures for _0 @  OCT, Retina - OU - Both Eyes       Right Eye Quality was good. Central Foveal Thickness: 329. Progression has worsened. Findings include abnormal foveal contour, intraretinal fluid, no SRF, retinal drusen , outer retinal atrophy (Mild interval increase in central cystic changes).   Left Eye Quality was good. Central Foveal Thickness: 258. Progression has worsened. Findings include no SRF, outer retinal atrophy, retinal drusen , no IRF, normal foveal contour (Trace Persistent cystic changes -- slightly inreased).   Notes *Images captured and stored on drive  Diagnosis / Impression:  DME OU, OD>OS OD: Mild interval increase in central cystic changes OS: tr persistent cystic changes  Clinical management:  See below  Abbreviations: NFP - Normal foveal profile. CME - cystoid macular edema. PED - pigment epithelial detachment. IRF - intraretinal fluid. SRF - subretinal fluid. EZ - ellipsoid zone. ERM - epiretinal membrane. ORA - outer retinal atrophy. ORT - outer retinal tubulation. SRHM - subretinal hyper-reflective material       Intravitreal Injection, Pharmacologic Agent - OD - Right Eye       Time Out 08/12/2021. 10:11 AM. Confirmed correct patient, procedure, site, and  patient consented.   Anesthesia Topical anesthesia was used. Anesthetic medications included Lidocaine 2%, Proparacaine 0.5%.   Procedure Preparation included 5% betadine to ocular surface, eyelid speculum. A (32g) needle was used.   Injection: 2 mg aflibercept 2 MG/0.05ML   Route: Intravitreal, Site: Right Eye   NDC: A3590391, Lot: [id number], Expiration date: 06/28/2022, Waste: 0.05 mL   Post-op Post injection exam found visual acuity of at least counting fingers. The patient tolerated the procedure well. There were no complications. The patient received written and verbal post procedure care education. Post injection medications were not given.      Intravitreal Injection, Pharmacologic Agent - OS - Left Eye       Time Out 08/12/2021. 10:11 AM. Confirmed correct patient, procedure, site, and patient consented.   Anesthesia Topical anesthesia was used. Anesthetic medications included Lidocaine 2%, Proparacaine 0.5%.   Procedure Preparation included 5% betadine to ocular surface, eyelid speculum. A (32g) needle was used.   Injection: 2 mg aflibercept 2 MG/0.05ML   Route: Intravitreal, Site: Left Eye   NDC: M7179715, Lot: [id number], Expiration date: 11/26/2022, Waste: 0.05 mL   Post-op Post injection exam found visual acuity of at least  counting fingers. The patient tolerated the procedure well. There were no complications. The patient received written and verbal post procedure care education. Post injection medications were not given.             ASSESSMENT/PLAN:    ICD-10-CM   1. Moderate nonproliferative diabetic retinopathy of both eyes with macular edema associated with type 2 diabetes mellitus (HCC)  N86.7672 Intravitreal Injection, Pharmacologic Agent - OD - Right Eye    Intravitreal Injection, Pharmacologic Agent - OS - Left Eye    aflibercept (EYLEA) SOLN 2 mg    aflibercept (EYLEA) SOLN 2 mg    2. Retinal edema  H35.81 OCT, Retina - OU - Both Eyes     3. Essential hypertension  I10     4. Hypertensive retinopathy of both eyes  H35.033     5. Pseudophakia of both eyes  Z96.1      1,2. Moderate non-proliferative diabetic retinopathy with edema OU  - delayed from 8 weeks to 14 weeks due to hospitalizations for vertigo and problems with neuropathy in legs in feet   - moved to [place] from [place], [place] -- history of prior injections OS with Dr. [name]  - records received and reviewed from [facility] (Dr. [name])  - history of focal laser OS x2 and IVK/IVT OU in [place] -- last injection was [facility] OS November 2013  - initial exam: scattered Al[place], DBH and +macular edema  - initial OCT: diabetic macular edema, both eyes, (OD > OS)  - FA (10.1.19) showed late leaking MA OU -- no NV  - delayed f/u on 4.16.20 due to hospitalization for sepsis / gallstones  - S/P PRP OD (07.14.20) - good laser surrounding, room for posterior fill in if needed  - S/P PRP OS (08.26.20)  - S/P IVA OD #1 (09.17.19), #2 (10.29.19), #3 (11.26.19), #4 (01.02.20), #5 (01.30.20), #6 (02.28.20), #7 (04.16.20), #8 (06.11.20), #9 (08.12.20), #10 (01.11.21)  - S/P IVA OS #1 (10.01.19), #2 (10.29.19), #3 (11.26.19), #4 (01.02.20), #5 (01.30.20), #6 (02.28.20), #7 (04.16.20), #8 (05.14.20), #9 (06.11.20), #10 (07.09.20), #11 (08.12.20), #12 (01.11.21)  - S/P IVK OD #1 (05.14.20) -- no significant improvement  - switched back to Avastin OD (#9 6.11.2020)  - repeated Eylea4U benefits investigation due to possible IVA resistance (8.12.20)  - S/P IVE OS #1 (09.16.20), #2 (10.16.20), #3 (11.13.20), #4 (12.11.20), #5 (02.08.21), #6 (03.12.21), #7 (04.16.21), #8 (05.14.21), #9 (06.18.21), #10 (07.23.21), #11 (8.30.21), #12 (10.05.21), #13 (11.10.21), #14 (12.15.21), #15 (1.18.22), #16 (03.17.22)  - S/P IVE OD #1 (09.16.20), #2 (10.16.20), #3 (11.13.20), #4 (12.11.20), #5 (02.08.21), #6 (03.12.21), #7 (04.16.21), #8 (05.14.21), #9 (06.18.21), #10 (07.23.21), #11  (08.30.21), #12 (10.05.21), #13 (11.10.21), #14 (12.15.21), #15 (01.18.22), #16 (03.17.22), #17 (5.12.22), #18 (08.02.22)  - OCT today shows OD: Mild interval increase in central cystic changes; OS: tr persistent cystic changes at 14 weels  - BCVA 20/25 OU  - recommend IVE OU (OD #19, OS #17)today, 11.14.22 w/ f/u in 12 weeks  - pt in agreement  - Eylea4U benefits investigation completed and verified for 2022 as of 01.18.22  - RBA of procedure discussed, questions answered   - informed consent obtained  - see procedure note  - Eylea informed consent form signed and scanned on 12.11.2020  - f/u 12 weeks, DFE, OCT, possible injection  3,4. Hypertensive retinopathy OU  - discussed importance of tight BP control  - monitor  5. Pseudophakia OU  - s/p CE/IOL OU in [place], [place]  -  beautiful surgeries, doing well  - monitor  Ophthalmic Meds Ordered this visit:  Meds ordered this encounter  Medications   aflibercept (EYLEA) SOLN 2 mg   aflibercept (EYLEA) SOLN 2 mg     Return in about 12 weeks (around 11/04/2021) for f/u NPDR OU, DFE, OCT.  There are no Patient Instructions on file for this visit.  This document serves as a record of services personally performed by Gardiner Sleeper, MD, PhD. It was created on their behalf by Roselee Nova, COMT. The creation of this record is the provider's dictation and/or activities during the visit.  Electronically signed by: Roselee Nova, COMT 08/12/21 10:02 PM  This document serves as a record of services personally performed by Gardiner Sleeper, MD, PhD. It was created on their behalf by San Jetty. Owens Shark, OA an ophthalmic technician. The creation of this record is the provider's dictation and/or activities during the visit.    Electronically signed by: San Jetty. Owens Shark, [place] 11.14.2022 10:02 PM  Gardiner Sleeper, M.D., Ph.D. Diseases & Surgery of the Retina and Vitreous [facility]  I have reviewed the above documentation for  accuracy and completeness, and I agree with the above. Gardiner Sleeper, M.D., Ph.D. 08/12/21 10:04 PM   Abbreviations: M myopia (nearsighted); A astigmatism; H hyperopia (farsighted); P presbyopia; Mrx spectacle prescription;  CTL contact lenses; OD right eye; OS left eye; OU both eyes  XT exotropia; ET esotropia; PEK punctate epithelial keratitis; PEE punctate epithelial erosions; DES dry eye syndrome; MGD meibomian gland dysfunction; ATs artificial tears; PFAT's preservative free artificial tears; [place] nuclear sclerotic cataract; PSC posterior subcapsular cataract; ERM epi-retinal membrane; PVD posterior vitreous detachment; RD retinal detachment; DM diabetes mellitus; DR diabetic retinopathy; NPDR non-proliferative diabetic retinopathy; PDR proliferative diabetic retinopathy; CSME clinically significant macular edema; DME diabetic macular edema; dbh dot blot hemorrhages; CWS cotton wool spot; POAG primary open angle glaucoma; C/D cup-to-disc ratio; HVF humphrey visual field; GVF goldmann visual field; OCT optical coherence tomography; IOP intraocular pressure; BRVO Branch retinal vein occlusion; CRVO central retinal vein occlusion; CRAO central retinal artery occlusion; BRAO branch retinal artery occlusion; RT retinal tear; SB scleral buckle; PPV pars plana vitrectomy; VH Vitreous hemorrhage; PRP panretinal laser photocoagulation; IVK intravitreal kenalog; VMT vitreomacular traction; MH Macular hole;  NVD neovascularization of the disc; NVE neovascularization elsewhere; AREDS age related eye disease study; ARMD age related macular degeneration; POAG primary open angle glaucoma; EBMD epithelial/anterior basement membrane dystrophy; ACIOL anterior chamber intraocular lens; IOL intraocular lens; PCIOL posterior chamber intraocular lens; Phaco/IOL phacoemulsification with intraocular lens placement; [place] photorefractive keratectomy; LASIK laser assisted in situ keratomileusis; HTN hypertension; DM diabetes  mellitus; COPD chronic obstructive pulmonary disease

## 2021-08-12 ENCOUNTER — Ambulatory Visit (INDEPENDENT_AMBULATORY_CARE_PROVIDER_SITE_OTHER): Payer: Medicare HMO | Admitting: Ophthalmology

## 2021-08-12 ENCOUNTER — Other Ambulatory Visit: Payer: Self-pay

## 2021-08-12 ENCOUNTER — Encounter (INDEPENDENT_AMBULATORY_CARE_PROVIDER_SITE_OTHER): Payer: Self-pay | Admitting: Ophthalmology

## 2021-08-12 DIAGNOSIS — E113313 Type 2 diabetes mellitus with moderate nonproliferative diabetic retinopathy with macular edema, bilateral: Secondary | ICD-10-CM

## 2021-08-12 DIAGNOSIS — Z961 Presence of intraocular lens: Secondary | ICD-10-CM

## 2021-08-12 DIAGNOSIS — H3581 Retinal edema: Secondary | ICD-10-CM

## 2021-08-12 DIAGNOSIS — H35033 Hypertensive retinopathy, bilateral: Secondary | ICD-10-CM

## 2021-08-12 DIAGNOSIS — I10 Essential (primary) hypertension: Secondary | ICD-10-CM

## 2021-08-12 MED ORDER — AFLIBERCEPT 2MG/0.05ML IZ SOLN FOR KALEIDOSCOPE
2.0000 mg | INTRAVITREAL | Status: AC | PRN
  Administered 2021-08-12: 2 mg via INTRAVITREAL

## 2021-08-15 DIAGNOSIS — E1142 Type 2 diabetes mellitus with diabetic polyneuropathy: Secondary | ICD-10-CM | POA: Diagnosis not present

## 2021-08-15 DIAGNOSIS — M47897 Other spondylosis, lumbosacral region: Secondary | ICD-10-CM | POA: Diagnosis not present

## 2021-08-21 DIAGNOSIS — Z0001 Encounter for general adult medical examination with abnormal findings: Secondary | ICD-10-CM | POA: Diagnosis not present

## 2021-08-21 DIAGNOSIS — R1013 Epigastric pain: Secondary | ICD-10-CM | POA: Diagnosis not present

## 2021-08-21 DIAGNOSIS — E1142 Type 2 diabetes mellitus with diabetic polyneuropathy: Secondary | ICD-10-CM | POA: Diagnosis not present

## 2021-08-21 DIAGNOSIS — E782 Mixed hyperlipidemia: Secondary | ICD-10-CM | POA: Diagnosis not present

## 2021-08-21 DIAGNOSIS — I10 Essential (primary) hypertension: Secondary | ICD-10-CM | POA: Diagnosis not present

## 2021-08-21 DIAGNOSIS — E1165 Type 2 diabetes mellitus with hyperglycemia: Secondary | ICD-10-CM | POA: Diagnosis not present

## 2021-08-26 DIAGNOSIS — M48062 Spinal stenosis, lumbar region with neurogenic claudication: Secondary | ICD-10-CM | POA: Diagnosis not present

## 2021-08-26 DIAGNOSIS — N183 Chronic kidney disease, stage 3 unspecified: Secondary | ICD-10-CM | POA: Diagnosis not present

## 2021-09-02 DIAGNOSIS — I10 Essential (primary) hypertension: Secondary | ICD-10-CM | POA: Diagnosis not present

## 2021-09-06 DIAGNOSIS — N183 Chronic kidney disease, stage 3 unspecified: Secondary | ICD-10-CM | POA: Diagnosis not present

## 2021-09-06 DIAGNOSIS — D631 Anemia in chronic kidney disease: Secondary | ICD-10-CM | POA: Diagnosis not present

## 2021-09-06 DIAGNOSIS — I129 Hypertensive chronic kidney disease with stage 1 through stage 4 chronic kidney disease, or unspecified chronic kidney disease: Secondary | ICD-10-CM | POA: Diagnosis not present

## 2021-09-06 DIAGNOSIS — N2581 Secondary hyperparathyroidism of renal origin: Secondary | ICD-10-CM | POA: Diagnosis not present

## 2021-10-03 DIAGNOSIS — I10 Essential (primary) hypertension: Secondary | ICD-10-CM | POA: Diagnosis not present

## 2021-10-08 ENCOUNTER — Telehealth: Payer: Self-pay | Admitting: Pharmacist

## 2021-10-08 DIAGNOSIS — E782 Mixed hyperlipidemia: Secondary | ICD-10-CM | POA: Diagnosis not present

## 2021-10-08 DIAGNOSIS — I10 Essential (primary) hypertension: Secondary | ICD-10-CM | POA: Diagnosis not present

## 2021-10-08 DIAGNOSIS — E1165 Type 2 diabetes mellitus with hyperglycemia: Secondary | ICD-10-CM | POA: Diagnosis not present

## 2021-10-08 DIAGNOSIS — F418 Other specified anxiety disorders: Secondary | ICD-10-CM | POA: Diagnosis not present

## 2021-10-08 DIAGNOSIS — Z794 Long term (current) use of insulin: Secondary | ICD-10-CM | POA: Diagnosis not present

## 2021-10-08 NOTE — Telephone Encounter (Signed)
Reviewed. BP reading continues to remain elevated. Pt reports that he is still struggling with vertigo and dizziness symptoms. Continues to have complains of tinnitus. Recently saw an otolaryngologist who wasnt able to provide pt with much answers. Reports feeling nauseous and unsteady during episodes. BP trending up compared to previous trends. Pt notes that he has been taking his medications daily. Med list reviewed and updated with pt's daughter. Pt currently taking hydralazine 50 mg BID, isordil 30 mg TID, labetalol 200 mg BID, losartan 12.5 mg daily. Pt reports that he has an appt with PCP today. Planning on discussing his dizziness complains with PCP. Pt was also referred to neurologist to review his concerns. Will continue monitoring his home BP readings closely. If SBP continues to remain >150 mmHg, will consider increasing hydralazine dose to 50 mg TID. Encouraged pt to continue staying well hydrated, changing positions cautiously, and using a walking aid to hep improve his gait and balance. Denies any recent falls and syncope like episodes.

## 2021-10-16 DIAGNOSIS — M48062 Spinal stenosis, lumbar region with neurogenic claudication: Secondary | ICD-10-CM | POA: Diagnosis not present

## 2021-10-17 ENCOUNTER — Other Ambulatory Visit: Payer: Self-pay | Admitting: Neurosurgery

## 2021-10-17 DIAGNOSIS — M48062 Spinal stenosis, lumbar region with neurogenic claudication: Secondary | ICD-10-CM

## 2021-10-17 NOTE — Progress Notes (Addendum)
Phone call to patient to review instructions for 13 hr prep for Myelogram CT w/ contrast on 10/21/21 at 10:30 AM. Prescription called into [facility]. Pt aware and verbalized understanding of instructions. Prescription: 10/23/21 @ 9:30 PM- 50mg  Prednisone 10/24/21 @ 3:30 AM- 50mg  Prednisone 10/24/21 @ 9:30 AM - 50mg  Prednisone and 50mg  Benadryl   Per the pts daughters request benadryl was also called in as a prescription.

## 2021-10-21 DIAGNOSIS — F418 Other specified anxiety disorders: Secondary | ICD-10-CM | POA: Diagnosis not present

## 2021-10-21 DIAGNOSIS — M792 Neuralgia and neuritis, unspecified: Secondary | ICD-10-CM | POA: Diagnosis not present

## 2021-10-21 DIAGNOSIS — E782 Mixed hyperlipidemia: Secondary | ICD-10-CM | POA: Diagnosis not present

## 2021-10-21 DIAGNOSIS — I10 Essential (primary) hypertension: Secondary | ICD-10-CM | POA: Diagnosis not present

## 2021-10-21 DIAGNOSIS — Z794 Long term (current) use of insulin: Secondary | ICD-10-CM | POA: Diagnosis not present

## 2021-10-21 DIAGNOSIS — L603 Nail dystrophy: Secondary | ICD-10-CM | POA: Diagnosis not present

## 2021-10-21 DIAGNOSIS — B351 Tinea unguium: Secondary | ICD-10-CM | POA: Diagnosis not present

## 2021-10-21 DIAGNOSIS — M19072 Primary osteoarthritis, left ankle and foot: Secondary | ICD-10-CM | POA: Diagnosis not present

## 2021-10-21 DIAGNOSIS — L84 Corns and callosities: Secondary | ICD-10-CM | POA: Diagnosis not present

## 2021-10-21 DIAGNOSIS — E1165 Type 2 diabetes mellitus with hyperglycemia: Secondary | ICD-10-CM | POA: Diagnosis not present

## 2021-10-21 DIAGNOSIS — M19071 Primary osteoarthritis, right ankle and foot: Secondary | ICD-10-CM | POA: Diagnosis not present

## 2021-10-21 DIAGNOSIS — E1151 Type 2 diabetes mellitus with diabetic peripheral angiopathy without gangrene: Secondary | ICD-10-CM | POA: Diagnosis not present

## 2021-10-21 DIAGNOSIS — I739 Peripheral vascular disease, unspecified: Secondary | ICD-10-CM | POA: Diagnosis not present

## 2021-10-23 DIAGNOSIS — E782 Mixed hyperlipidemia: Secondary | ICD-10-CM | POA: Diagnosis not present

## 2021-10-23 DIAGNOSIS — I10 Essential (primary) hypertension: Secondary | ICD-10-CM | POA: Diagnosis not present

## 2021-10-23 DIAGNOSIS — Z794 Long term (current) use of insulin: Secondary | ICD-10-CM | POA: Diagnosis not present

## 2021-10-23 DIAGNOSIS — F418 Other specified anxiety disorders: Secondary | ICD-10-CM | POA: Diagnosis not present

## 2021-10-23 DIAGNOSIS — E1165 Type 2 diabetes mellitus with hyperglycemia: Secondary | ICD-10-CM | POA: Diagnosis not present

## 2021-10-24 ENCOUNTER — Ambulatory Visit
Admission: RE | Admit: 2021-10-24 | Discharge: 2021-10-24 | Disposition: A | Discharge status: Home / Self Care | Payer: Medicare HMO | Source: Ambulatory Visit | Attending: Neurosurgery | Admitting: Neurosurgery

## 2021-10-24 DIAGNOSIS — M48062 Spinal stenosis, lumbar region with neurogenic claudication: Secondary | ICD-10-CM

## 2021-10-24 DIAGNOSIS — M5126 Other intervertebral disc displacement, lumbar region: Secondary | ICD-10-CM | POA: Diagnosis not present

## 2021-10-24 MED ORDER — MEPERIDINE HCL 50 MG/ML IJ SOLN: 50.0000 mg | Freq: Once | INTRAMUSCULAR | Status: DC | PRN

## 2021-10-24 MED ORDER — DIAZEPAM 5 MG PO TABS
5.0000 mg | ORAL_TABLET | Freq: Once | ORAL | Status: AC
  Administered 2021-10-24: 5 mg via ORAL

## 2021-10-24 MED ORDER — ONDANSETRON HCL 4 MG/2ML IJ SOLN: 4.0000 mg | Freq: Once | INTRAMUSCULAR | Status: DC | PRN

## 2021-10-24 MED ORDER — IOPAMIDOL (ISOVUE-M 200) INJECTION 41%
15.0000 mL | Freq: Once | INTRAMUSCULAR | Status: AC
  Administered 2021-10-24: 15 mL via INTRATHECAL

## 2021-10-24 NOTE — Discharge Instructions (Signed)

## 2021-10-31 NOTE — Progress Notes (Incomplete)
[facility] Note  11/04/2021     CHIEF COMPLAINT Patient presents for No chief complaint on file.    HISTORY OF PRESENT ILLNESS: Justin [name] is a 88 y.o. male who presents to the clinic today for:     Pt is delayed to follow up from 8 weeks to 14 due to being in the hospital a couple times since last visit with vertigo, he is also having trouble with his legs and feet with neuropathy, he feels like his vision is doing pretty good  Referring physician: [name], MD [id number] [place],  [place] [address]  HISTORICAL INFORMATION:   Selected notes from the [place] Referred by Dr. [name] for concern of mac edema OU   CURRENT MEDICATIONS: No current outpatient medications on file. (Ophthalmic Drugs)   No current facility-administered medications for this visit. (Ophthalmic Drugs)   Current Outpatient Medications (Other)  Medication Sig   ACCU-CHEK SMARTVIEW test strip    Accu-Chek Softclix Lancets lancets    acetaminophen (TYLENOL) 500 MG tablet Take 1,000 mg by mouth 2 (two) times daily as needed for mild pain.   aspirin EC 81 MG tablet Take 81 mg by mouth daily. (Patient not taking: Reported on 10/08/2021)   Blood Glucose Monitoring Suppl (ACCU-CHEK GUIDE) w/Device KIT    cholecalciferol (VITAMIN D3) 25 MCG (1000 UNIT) tablet Take 2,000 Units by mouth daily.   clonazePAM (KLONOPIN) 0.5 MG tablet Take 0.5 mg by mouth daily as needed for anxiety.   DULoxetine (CYMBALTA) 30 MG capsule Take 60 mg by mouth daily.   ezetimibe (ZETIA) 10 MG tablet TAKE 1 TABLET DAILY AFTER SUPPER (Patient taking differently: Take 10 mg by mouth every evening.)   gabapentin (NEURONTIN) 300 MG capsule Take 300 mg by mouth 2 (two) times daily.   hydrALAZINE (APRESOLINE) 50 MG tablet Take 1 tablet (50 mg total) by mouth 3 (three) times daily. (Patient taking differently: Take 50 mg by mouth 2 (two) times daily.)   isosorbide dinitrate  (ISORDIL) 30 MG tablet TAKE 1 TABLET THREE TIMES DAILY (Patient taking differently: Take 30 mg by mouth 3 (three) times daily.)   JANUVIA 100 MG tablet Take 100 mg by mouth daily.    labetalol (NORMODYNE) 100 MG tablet Take 2 tablets (200 mg total) by mouth 2 (two) times daily.   LANTUS SOLOSTAR 100 UNIT/ML Solostar Pen Inject 15-50 Units into the skin See admin instructions. Inject 50 units in the morning and 15 units in the evening.   losartan (COZAAR) 25 MG tablet Take 1 tablet (25 mg total) by mouth daily. (Patient taking differently: Take 12.5 mg by mouth daily.)   meclizine (ANTIVERT) 25 MG tablet Take 1 tablet (25 mg total) by mouth 3 (three) times daily as needed for dizziness. (Patient not taking: Reported on 10/08/2021)   omeprazole (PRILOSEC) 40 MG capsule Take 40 mg by mouth daily.    Pyridoxine HCl (B-6 PO) Take 1 capsule by mouth daily.   simvastatin (ZOCOR) 20 MG tablet Take 20 mg by mouth daily.   tamsulosin (FLOMAX) 0.4 MG CAPS capsule Take 0.4 mg by mouth daily.   vitamin B-12 (CYANOCOBALAMIN) 500 MCG tablet Take 500 mcg by mouth daily.   Vitamin E 268 MG (400 UNIT) CAPS Take by mouth.   XARELTO 2.5 MG TABS tablet TAKE 1 TABLET TWICE DAILY (Patient taking differently: Take 2.5 mg by mouth 2 (two) times daily.)   No current facility-administered medications for  this visit. (Other)      REVIEW OF SYSTEMS:       ALLERGIES Allergies  Allergen Reactions   Contrast Media [Iodinated Contrast Media] Itching    PT STATES ALLERGY TO "CT DYE".   Amlodipine Swelling    Leg swelling   Dye Fdc Red [Red Dye] Swelling    CAT scan dye   Ibuprofen Other (See Comments)    Upset stomach     PAST MEDICAL HISTORY Past Medical History:  Diagnosis Date   Anxiety    Arthritis    Carotid arterial disease ([place])    CKD (chronic kidney disease)    Coronary artery disease    Diabetes mellitus without complication (HCC)    Diabetic retinopathy ([place])    NPDR OU   Diverticulitis     Dyspnea    GERD (gastroesophageal reflux disease)    History of kidney stones 2021   Hypercholesteremia    Hypertension    Hypertensive retinopathy    OU   Myocardial infarction [facility]) 2009   Pneumonia    as a child   Prostate cancer ([place])    UTI (lower urinary tract infection)    Past Surgical History:  Procedure Laterality Date   ABDOMINAL SURGERY     for diverticulitis; also removed appendix   APPENDECTOMY     CARDIAC CATHETERIZATION  2018   CATARACT EXTRACTION     CATARACT EXTRACTION, BILATERAL     CHOLECYSTECTOMY N/A 10/12/2017   Procedure: LAPAROSCOPIC CHOLECYSTECTOMY;  Surgeon: [name], MD;  Location: [place];  Service: General;  Laterality: N/A;   CORONARY ARTERY BYPASS GRAFT  2009   ERCP N/A 12/21/2018   Procedure: ENDOSCOPIC RETROGRADE CHOLANGIOPANCREATOGRAPHY (ERCP);  Surgeon: [name], MD;  Location: [place];  Service: Endoscopy;  Laterality: N/A;   ESOPHAGOGASTRODUODENOSCOPY (EGD) WITH PROPOFOL N/A 12/17/2018   Procedure: ESOPHAGOGASTRODUODENOSCOPY (EGD) WITH PROPOFOL;  Surgeon: [name], MD;  Location: [place];  Service: Endoscopy;  Laterality: N/A;   EUS Left 12/17/2018   Procedure: UPPER ENDOSCOPIC ULTRASOUND (EUS) LINEAR;  Surgeon: [name], MD;  Location: [place];  Service: Endoscopy;  Laterality: Left;   EYE SURGERY     "for bleeding in eye"   HERNIA REPAIR     LEFT HEART CATH AND CORONARY ANGIOGRAPHY N/A 11/04/2016   Procedure: Left Heart Cath and Coronary Angiography;  Surgeon: [name], MD;  Location: [place] [facility];  Service: Cardiovascular;  Laterality: N/A;   LOWER EXTREMITY ANGIOGRAPHY N/A 11/04/2016   Procedure: Lower Extremity Angiography;  Surgeon: [name], MD;  Location: [place] [facility];  Service: Cardiovascular;  Laterality: N/A;   LOWER EXTREMITY ANGIOGRAPHY N/A 01/03/2020   Procedure: LOWER EXTREMITY ANGIOGRAPHY;  Surgeon: [name], MD;  Location: [place] [facility];  Service: Cardiovascular;  Laterality:  N/A;   LUMBAR LAMINECTOMY/DECOMPRESSION MICRODISCECTOMY Bilateral 11/09/2020   Procedure: Laminectomy and Foraminotomy - bilateral - Lumbar Four-Five.;  Surgeon: Earnie Larsson, MD;  Location: [place];  Service: Neurosurgery;  Laterality: Bilateral;  posterior   PROSTATE SURGERY     REMOVAL OF STONES  12/21/2018   Procedure: REMOVAL OF STONES;  Surgeon: [name], MD;  Location: [place];  Service: Endoscopy;;   SPHINCTEROTOMY  12/21/2018   Procedure: [name];  Surgeon: [name], MD;  Location: [facility] ENDOSCOPY;  Service: Endoscopy;;    FAMILY HISTORY Family History  Problem Relation Age of Onset   Diabetes Father    Diabetes Maternal Aunt    Diabetes Maternal Uncle    Diabetes Maternal  Grandmother    Heart disease Sister    Diabetes Brother    Heart disease Sister     SOCIAL HISTORY Social History   Tobacco Use   Smoking status: Former    Packs/day: 0.25    Years: 2.00    Pack years: 0.50    Types: Cigarettes   Smokeless tobacco: Never   Tobacco comments:    when he was a teenage age [age]  Vaping Use   Vaping Use: Never used  Substance Use Topics   Alcohol use: No   Drug use: No       OPHTHALMIC EXAM: Not recorded     IMAGING AND PROCEDURES  Imaging and Procedures for _0 @           ASSESSMENT/PLAN:  No diagnosis found.  1. Moderate non-proliferative diabetic retinopathy with edema OU  - delayed from 8 weeks to 14 weeks due to hospitalizations for vertigo and problems with neuropathy in legs in feet   - moved to [place] from [place], [place] -- history of prior injections OS with Dr. [name]  - records received and reviewed from [facility] [place] (Dr. [name])  - history of focal laser OS x2 and IVK/IVT OU in [place] -- last injection was [facility] OS November 2013  - initial exam: scattered [place], DBH and +macular edema  - initial OCT: diabetic macular edema, both eyes, (OD > OS)  - FA (10.1.19) showed late leaking MA OU -- no NV  -  delayed f/u on 4.16.20 due to hospitalization for sepsis / gallstones  - S/P PRP OD (07.14.20) - good laser surrounding, room for posterior fill in if needed  - S/P PRP OS (08.26.20)  - S/P IVA OD #1 (09.17.19), #2 (10.29.19), #3 (11.26.19), #4 (01.02.20), #5 (01.30.20), #6 (02.28.20), #7 (04.16.20), #8 (06.11.20), #9 (08.12.20), #10 (01.11.21)  - S/P IVA OS #1 (10.01.19), #2 (10.29.19), #3 (11.26.19), #4 (01.02.20), #5 (01.30.20), #6 (02.28.20), #7 (04.16.20), #8 (05.14.20), #9 (06.11.20), #10 (07.09.20), #11 (08.12.20), #12 (01.11.21)  - S/P IVK OD #1 (05.14.20) -- no significant improvement  - switched back to Avastin OD (#9 6.11.2020)  - repeated E[employer] benefits investigation due to possible IVA resistance (8.12.20)  - S/P IVE OS #1 (09.16.20), #2 (10.16.20), #3 (11.13.20), #4 (12.11.20), #5 (02.08.21), #6 (03.12.21), #7 (04.16.21), #8 (05.14.21), #9 (06.18.21), #10 (07.23.21), #11 (8.30.21), #12 (10.05.21), #13 (11.10.21), #14 (12.15.21), #15 (1.18.22), #16 (03.17.22), #17 (11.14.22)  - S/P IVE OD #1 (09.16.20), #2 (10.16.20), #3 (11.13.20), #4 (12.11.20), #5 (02.08.21), #6 (03.12.21), #7 (04.16.21), #8 (05.14.21), #9 (06.18.21), #10 (07.23.21), #11 (08.30.21), #12 (10.05.21), #13 (11.10.21), #14 (12.15.21), #15 (01.18.22), #16 (03.17.22), #17 (5.12.22), #18 (08.02.22), #19 (11.14.22)  - OCT today shows OD: Mild interval increase in central cystic changes; OS: tr persistent cystic changes at 14 weels  - BCVA 20/25 OU  - recommend IVE OU (OD #20, OS #18)today, 11.14.22 w/ f/u in 12 weeks  - pt in agreement  - [employer] benefits investigation completed and verified for 2022 as of 01.18.22  - RBA of procedure discussed, questions answered   - informed consent obtained  - see procedure note  - Eylea informed consent form signed and scanned on 12.11.2020  - f/u 12 weeks, DFE, OCT, possible injection  2,3. Hypertensive retinopathy OU  - discussed importance of tight BP control  - monitor  4.  Pseudophakia OU  - s/p CE/IOL OU in [place], [place]  - beautiful surgeries, doing well  - monitor  Ophthalmic Meds Ordered this visit:  No orders of the defined types were placed in this encounter.    No follow-ups on file.  There are no Patient Instructions on file for this visit.  This document serves as a record of services personally performed by Gardiner Sleeper, MD, PhD. It was created on their behalf by Roselee Nova, COMT. The creation of this record is the provider's dictation and/or activities during the visit.  Electronically signed by: Roselee Nova, COMT 10/31/21 2:34 PM   Gardiner Sleeper, M.D., Ph.D. Diseases & Surgery of the Retina and Vitreous [facility]: M myopia (nearsighted); A astigmatism; H hyperopia (farsighted); P presbyopia; Mrx spectacle prescription;  CTL contact lenses; OD right eye; OS left eye; OU both eyes  XT exotropia; ET esotropia; PEK punctate epithelial keratitis; PEE punctate epithelial erosions; DES dry eye syndrome; MGD meibomian gland dysfunction; ATs artificial tears; PFAT's preservative free artificial tears; [place] nuclear sclerotic cataract; PSC posterior subcapsular cataract; ERM epi-retinal membrane; PVD posterior vitreous detachment; RD retinal detachment; DM diabetes mellitus; DR diabetic retinopathy; NPDR non-proliferative diabetic retinopathy; PDR proliferative diabetic retinopathy; CSME clinically significant macular edema; DME diabetic macular edema; dbh dot blot hemorrhages; CWS cotton wool spot; POAG primary open angle glaucoma; C/D cup-to-disc ratio; HVF humphrey visual field; GVF goldmann visual field; OCT optical coherence tomography; IOP intraocular pressure; BRVO Branch retinal vein occlusion; CRVO central retinal vein occlusion; CRAO central retinal artery occlusion; BRAO branch retinal artery occlusion; RT retinal tear; SB scleral buckle; PPV pars plana vitrectomy; VH Vitreous hemorrhage; PRP  panretinal laser photocoagulation; IVK intravitreal kenalog; VMT vitreomacular traction; MH Macular hole;  NVD neovascularization of the disc; NVE neovascularization elsewhere; AREDS age related eye disease study; ARMD age related macular degeneration; POAG primary open angle glaucoma; EBMD epithelial/anterior basement membrane dystrophy; ACIOL anterior chamber intraocular lens; IOL intraocular lens; PCIOL posterior chamber intraocular lens; Phaco/IOL phacoemulsification with intraocular lens placement; [place] photorefractive keratectomy; LASIK laser assisted in situ keratomileusis; HTN hypertension; DM diabetes mellitus; COPD chronic obstructive pulmonary disease

## 2021-11-03 DIAGNOSIS — I10 Essential (primary) hypertension: Secondary | ICD-10-CM | POA: Diagnosis not present

## 2021-11-04 ENCOUNTER — Encounter (INDEPENDENT_AMBULATORY_CARE_PROVIDER_SITE_OTHER): Payer: Self-pay | Admitting: Ophthalmology

## 2021-11-04 ENCOUNTER — Other Ambulatory Visit: Payer: Self-pay

## 2021-11-04 ENCOUNTER — Ambulatory Visit (INDEPENDENT_AMBULATORY_CARE_PROVIDER_SITE_OTHER): Payer: Medicare HMO | Admitting: Ophthalmology

## 2021-11-04 DIAGNOSIS — E113313 Type 2 diabetes mellitus with moderate nonproliferative diabetic retinopathy with macular edema, bilateral: Secondary | ICD-10-CM | POA: Diagnosis not present

## 2021-11-04 DIAGNOSIS — I10 Essential (primary) hypertension: Secondary | ICD-10-CM | POA: Diagnosis not present

## 2021-11-04 DIAGNOSIS — Z961 Presence of intraocular lens: Secondary | ICD-10-CM

## 2021-11-04 DIAGNOSIS — H35033 Hypertensive retinopathy, bilateral: Secondary | ICD-10-CM

## 2021-11-04 MED ORDER — AFLIBERCEPT 2MG/0.05ML IZ SOLN FOR KALEIDOSCOPE
2.0000 mg | INTRAVITREAL | Status: AC | PRN
  Administered 2021-11-04: 2 mg via INTRAVITREAL

## 2021-11-04 NOTE — Progress Notes (Signed)
[facility] Note  11/04/2021     CHIEF COMPLAINT Patient presents for Retina Follow Up    HISTORY OF PRESENT ILLNESS: Justin [name] is a 88 y.o. male who presents to the clinic today for:   HPI     Retina Follow Up   Patient presents with  Diabetic Retinopathy.  In both eyes.  This started 12 weeks ago.  I, the attending physician,  performed the HPI with the patient and updated documentation appropriately.        Comments   Patient here for 12 weeks retina follow up for NPDR OU. Patient states vision about the same. In December 2022 had a stye that was painful. Used cortisone on it. Took off lid. Came back. Took off lid again. Stayed off.       Last edited by [name], MD on 11/04/2021 12:45 PM.    Pt reports vertigo is improving. Vision stable  Referring physician: [name], MD [address] [place],   [id number]  HISTORICAL INFORMATION:   Selected notes from the MEDICAL RECORD NUMBER Referred by Dr. [name] for concern of mac edema OU   CURRENT MEDICATIONS: No current outpatient medications on file. (Ophthalmic Drugs)   No current facility-administered medications for this visit. (Ophthalmic Drugs)   Current Outpatient Medications (Other)  Medication Sig   ACCU-CHEK SMARTVIEW test strip    Accu-Chek Softclix Lancets lancets    acetaminophen (TYLENOL) 500 MG tablet Take 1,000 mg by mouth 2 (two) times daily as needed for mild pain.   Blood Glucose Monitoring Suppl (ACCU-CHEK GUIDE) w/Device KIT    cholecalciferol (VITAMIN D3) 25 MCG (1000 UNIT) tablet Take 2,000 Units by mouth daily.   clonazePAM (KLONOPIN) 0.5 MG tablet Take 0.5 mg by mouth daily as needed for anxiety.   DULoxetine (CYMBALTA) 30 MG capsule Take 60 mg by mouth daily.   ezetimibe (ZETIA) 10 MG tablet TAKE 1 TABLET DAILY AFTER SUPPER (Patient taking differently: Take 10 mg by mouth every evening.)   gabapentin (NEURONTIN) 300 MG capsule  Take 300 mg by mouth 2 (two) times daily.   hydrALAZINE (APRESOLINE) 50 MG tablet Take 1 tablet (50 mg total) by mouth 3 (three) times daily. (Patient taking differently: Take 50 mg by mouth 2 (two) times daily.)   isosorbide dinitrate (ISORDIL) 30 MG tablet TAKE 1 TABLET THREE TIMES DAILY (Patient taking differently: Take 30 mg by mouth 3 (three) times daily.)   JANUVIA 100 MG tablet Take 100 mg by mouth daily.    labetalol (NORMODYNE) 100 MG tablet Take 2 tablets (200 mg total) by mouth 2 (two) times daily.   LANTUS SOLOSTAR 100 UNIT/ML Solostar Pen Inject 15-50 Units into the skin See admin instructions. Inject 50 units in the morning and 15 units in the evening.   losartan (COZAAR) 25 MG tablet Take 1 tablet (25 mg total) by mouth daily. (Patient taking differently: Take 12.5 mg by mouth daily.)   omeprazole (PRILOSEC) 40 MG capsule Take 40 mg by mouth daily.    Pyridoxine HCl (B-6 PO) Take 1 capsule by mouth daily.   simvastatin (ZOCOR) 20 MG tablet Take 20 mg by mouth daily.   tamsulosin (FLOMAX) 0.4 MG CAPS capsule Take 0.4 mg by mouth daily.   vitamin B-12 (CYANOCOBALAMIN) 500 MCG tablet Take 500 mcg by mouth daily.   Vitamin E 268 MG (400 UNIT) CAPS Take by mouth.   XARELTO 2.5 MG TABS tablet TAKE 1 TABLET  TWICE DAILY (Patient taking differently: Take 2.5 mg by mouth 2 (two) times daily.)   aspirin EC 81 MG tablet Take 81 mg by mouth daily. (Patient not taking: Reported on 10/08/2021)   meclizine (ANTIVERT) 25 MG tablet Take 1 tablet (25 mg total) by mouth 3 (three) times daily as needed for dizziness. (Patient not taking: Reported on 10/08/2021)   No current facility-administered medications for this visit. (Other)   REVIEW OF SYSTEMS: ROS   Positive for: Gastrointestinal, Genitourinary, Endocrine, Cardiovascular, Eyes Negative for: Constitutional, Neurological, Skin, Musculoskeletal, HENT, Respiratory, Psychiatric, Allergic/Imm, Heme/Lymph Last edited by [name], COA on  11/04/2021  9:51 AM.     ALLERGIES Allergies  Allergen Reactions   Contrast Media [Iodinated Contrast Media] Itching    PT STATES ALLERGY TO "CT DYE".   Amlodipine Swelling    Leg swelling   Dye Fdc Red [Red Dye] Swelling    CAT scan dye   Ibuprofen Other (See Comments)    Upset stomach    PAST MEDICAL HISTORY Past Medical History:  Diagnosis Date   Anxiety    Arthritis    Carotid arterial disease ([place])    CKD (chronic kidney disease)    Coronary artery disease    Diabetes mellitus without complication (HCC)    Diabetic retinopathy ([place])    NPDR OU   Diverticulitis    Dyspnea    GERD (gastroesophageal reflux disease)    History of kidney stones 2021   Hypercholesteremia    Hypertension    Hypertensive retinopathy    OU   Myocardial infarction [facility]) 2009   Pneumonia    as a child   Prostate cancer ([place])    UTI (lower urinary tract infection)    Past Surgical History:  Procedure Laterality Date   ABDOMINAL SURGERY     for diverticulitis; also removed appendix   APPENDECTOMY     CARDIAC CATHETERIZATION  2018   CATARACT EXTRACTION     CATARACT EXTRACTION, BILATERAL     CHOLECYSTECTOMY N/A 10/12/2017   Procedure: LAPAROSCOPIC CHOLECYSTECTOMY;  Surgeon: [name], MD;  Location: [place];  Service: General;  Laterality: N/A;   CORONARY ARTERY BYPASS GRAFT  2009   ERCP N/A 12/21/2018   Procedure: ENDOSCOPIC RETROGRADE CHOLANGIOPANCREATOGRAPHY (ERCP);  Surgeon: [name], MD;  Location: [place];  Service: Endoscopy;  Laterality: N/A;   ESOPHAGOGASTRODUODENOSCOPY (EGD) WITH PROPOFOL N/A 12/17/2018   Procedure: ESOPHAGOGASTRODUODENOSCOPY (EGD) WITH PROPOFOL;  Surgeon: [name], MD;  Location: [place];  Service: Endoscopy;  Laterality: N/A;   EUS Left 12/17/2018   Procedure: UPPER ENDOSCOPIC ULTRASOUND (EUS) LINEAR;  Surgeon: [name], MD;  Location: [place];  Service: Endoscopy;  Laterality: Left;   EYE SURGERY     "for bleeding in eye"    HERNIA REPAIR     LEFT HEART CATH AND CORONARY ANGIOGRAPHY N/A 11/04/2016   Procedure: Left Heart Cath and Coronary Angiography;  Surgeon: [name], MD;  Location: [place] [facility];  Service: Cardiovascular;  Laterality: N/A;   LOWER EXTREMITY ANGIOGRAPHY N/A 11/04/2016   Procedure: Lower Extremity Angiography;  Surgeon: [name], MD;  Location: [place] [facility];  Service: Cardiovascular;  Laterality: N/A;   LOWER EXTREMITY ANGIOGRAPHY N/A 01/03/2020   Procedure: LOWER EXTREMITY ANGIOGRAPHY;  Surgeon: [name], MD;  Location: [place] [facility];  Service: Cardiovascular;  Laterality: N/A;   LUMBAR LAMINECTOMY/DECOMPRESSION MICRODISCECTOMY Bilateral 11/09/2020   Procedure: Laminectomy and Foraminotomy - bilateral - Lumbar Four-Five.;  Surgeon: Earnie Larsson, MD;  Location:  [facility];  Service: Neurosurgery;  Laterality: Bilateral;  posterior   PROSTATE SURGERY     REMOVAL OF STONES  12/21/2018   Procedure: REMOVAL OF STONES;  Surgeon: [name], MD;  Location: [place];  Service: Endoscopy;;   SPHINCTEROTOMY  12/21/2018   Procedure: [name];  Surgeon: [name], MD;  Location[facility];  Service: Endoscopy;;   FAMILY HISTORY Family History  Problem Relation Age of Onset   Diabetes Father    Diabetes Maternal Aunt    Diabetes Maternal Uncle    Diabetes Maternal Grandmother    Heart disease Sister    Diabetes Brother    Heart disease Sister    SOCIAL HISTORY Social History   Tobacco Use   Smoking status: Former    Packs/day: 0.25    Years: 2.00    Pack years: 0.50    Types: Cigarettes   Smokeless tobacco: Never   Tobacco comments:    when he was a teenage age [age]  Vaping Use   Vaping Use: Never used  Substance Use Topics   Alcohol use: No   Drug use: No       OPHTHALMIC EXAM: Base Eye Exam     Visual Acuity (Snellen - Linear)       Right Left   Dist [place] 20/25 -1 20/30 +1   Dist ph [place]  20/25         Tonometry (Tonopen, 9:49 AM)       Right Left    Pressure 08 09         Pupils       Dark Light Shape React APD   Right 3 2 Round Brisk None   Left 3 2 Round Brisk None         Visual Fields (Counting fingers)       Left Right    Full Full         Extraocular Movement       Right Left    Full, Ortho Full, Ortho         Neuro/Psych     Oriented x3: Yes   Mood/Affect: Normal         Dilation     Both eyes: 1.0% Mydriacyl, 2.5% Phenylephrine @ 9:48 AM           Slit Lamp and Fundus Exam     Slit Lamp Exam       Right Left   Lids/Lashes Dermatochalasis - upper lid, Meibomian gland dysfunction Dermatochalasis - upper lid, Meibomian gland dysfunction   Conjunctiva/Sclera Melanosis Melanosis   Cornea Arcus, trace Punctate epithelial erosions, mild endopigment Arcus, 1-2+ Punctate epithelial erosions, trace endopigment, mild corneal haze   Anterior Chamber Deep and quiet Deep and quiet   Iris Mild Temporal Iris atrophy, Round and dilated, No NVI Mild Temporal Iris atrophy, Round and dilated, No NVI   Lens PC IOL in good position with mild aterior capsule fimosis PC IOL in good position with mild aterior capsule fimosis   Anterior Vitreous Vitreous syneresis, Posterior vitreous detachment, vitreous condensations Vitreous syneresis, Posterior vitreous detachment, vitreous condensations         Fundus Exam       Right Left   Disc pallor with sharp rim, Peripapillary atrophy, Compact mild pallor, sharp rim, Tilted disc, mild temporal Peripapillary atrophy, Compact   C/D Ratio 0.2 0.3   Macula Blunted foveal reflex, Retinal pigment epithelial mottling and clumping, Microaneurysms -- improved, persistent central cystic changes -- slightly improved  Improved foveal reflex, focal area of RPE atrophy SN to fovea, Microaneurysms, Retinal pigment epithelial mottling, refractile Epiretinal membrane, central cystic changes - improved   Vessels Vascular attenuation, Tortuous, AV crossing changes, mild [place] wiring  attenuated, Tortuous   Periphery Attached, rare, scattered MA, good peripheral PRP 360 - room for posterior fill in if needed Attached, rare, scattered MA, Peripheral PRP 360 -- room for posterior fill in if needed, focal, white-centered IRH IN to disc           Refraction     Wearing Rx       Sphere Cylinder Axis Add   Right -0.75 +1.75 175 +3.00   Left -0.25 +1.50 180 +3.00    Type: Bifocal           IMAGING AND PROCEDURES  Imaging and Procedures for _0 @  OCT, Retina - OU - Both Eyes       Right Eye Quality was good. Central Foveal Thickness: 310. Progression has improved. Findings include abnormal foveal contour, intraretinal fluid, no SRF, retinal drusen , outer retinal atrophy (Mild interval improvement in central IRF/IRHM).   Left Eye Quality was good. Central Foveal Thickness: 239. Progression has improved. Findings include no SRF, outer retinal atrophy, retinal drusen , no IRF, normal foveal contour, intraretinal hyper-reflective material (Interval resolution of IRF, just trace cystic changes and IRHM remain).   Notes *Images captured and stored on drive  Diagnosis / Impression:  DME OU, OD>OS OD: Mild interval improvement in central IRF/IRHM OS: Interval resolution of IRF, just trace cystic changes and IRHM remain  Clinical management:  See below  Abbreviations: NFP - Normal foveal profile. CME - cystoid macular edema. PED - pigment epithelial detachment. IRF - intraretinal fluid. SRF - subretinal fluid. EZ - ellipsoid zone. ERM - epiretinal membrane. ORA - outer retinal atrophy. ORT - outer retinal tubulation. SRHM - subretinal hyper-reflective material       Intravitreal Injection, Pharmacologic Agent - OD - Right Eye       Time Out 11/04/2021. 10:24 AM. Confirmed correct patient, procedure, site, and patient consented.   Anesthesia Topical anesthesia was used. Anesthetic medications included Lidocaine 2%, Proparacaine 0.5%.    Procedure Preparation included 5% betadine to ocular surface, eyelid speculum. A (32g) needle was used.   Injection: 2 mg aflibercept 2 MG/0.05ML   Route: Intravitreal, Site: Right Eye   NDC: A3590391, Lot: [id number], Expiration date: 08/28/2022, Waste: 0.05 mL   Post-op Post injection exam found visual acuity of at least counting fingers. The patient tolerated the procedure well. There were no complications. The patient received written and verbal post procedure care education. Post injection medications were not given.      Intravitreal Injection, Pharmacologic Agent - OS - Left Eye       Time Out 11/04/2021. 10:24 AM. Confirmed correct patient, procedure, site, and patient consented.   Anesthesia Topical anesthesia was used. Anesthetic medications included Lidocaine 2%, Proparacaine 0.5%.   Procedure Preparation included 5% betadine to ocular surface, eyelid speculum. A (32g) needle was used.   Injection: 2 mg aflibercept 2 MG/0.05ML   Route: Intravitreal, Site: Left Eye   NDC: A3590391, Lot: [id number], Expiration date: 08/29/2022, Waste: 0.05 mL   Post-op Post injection exam found visual acuity of at least counting fingers. The patient tolerated the procedure well. There were no complications. The patient received written and verbal post procedure care education. Post injection medications were not given.            ASSESSMENT/PLAN:  ICD-10-CM   1. Moderate nonproliferative diabetic retinopathy of both eyes with macular edema associated with type 2 diabetes mellitus (HCC)  E11.3313 OCT, Retina - OU - Both Eyes    Intravitreal Injection, Pharmacologic Agent - OD - Right Eye    Intravitreal Injection, Pharmacologic Agent - OS - Left Eye    aflibercept (EYLEA) SOLN 2 mg    aflibercept (EYLEA) SOLN 2 mg    2. Essential hypertension  I10     3. Hypertensive retinopathy of both eyes  H35.033     4. Pseudophakia of both eyes  Z96.1       1. Moderate  non-proliferative diabetic retinopathy with edema OU  - moved to [place] from [place], [place] -- history of prior injections OS with Dr. [name]  - records received and reviewed from [facility] (Dr. [name])  - history of focal laser OS x2 and IVK/IVT OU in [place] -- last injection was IVK OS November 2013  - initial exam: scattered [place], DBH and +macular edema  - initial OCT: diabetic macular edema, both eyes, (OD > OS)  - FA (10.1.19) showed late leaking MA OU -- no NV  - delayed f/u on 4.16.20 due to hospitalization for sepsis / gallstones  - S/P PRP OD (07.14.20) - good laser surrounding, room for posterior fill in if needed  - S/P PRP OS (08.26.20)  - S/P IVA OD #1 (09.17.19), #2 (10.29.19), #3 (11.26.19), #4 (01.02.20), #5 (01.30.20), #6 (02.28.20), #7 (04.16.20), #8 (06.11.20), #9 (08.12.20), #10 (01.11.21)  - S/P IVA OS #1 (10.01.19), #2 (10.29.19), #3 (11.26.19), #4 (01.02.20), #5 (01.30.20), #6 (02.28.20), #7 (04.16.20), #8 (05.14.20), #9 (06.11.20), #10 (07.09.20), #11 (08.12.20), #12 (01.11.21)  - S/P IVK OD #1 (05.14.20) -- no significant improvement  - switched back to Avastin OD (#9 6.11.2020)  - repeated Eylea4U benefits investigation due to possible IVA resistance (8.12.20)  - S/P IVE OS #1 (09.16.20), #2 (10.16.20), #3 (11.13.20), #4 (12.11.20), #5 (02.08.21), #6 (03.12.21), #7 (04.16.21), #8 (05.14.21), #9 (06.18.21), #10 (07.23.21), #11 (8.30.21), #12 (10.05.21), #13 (11.10.21), #14 (12.15.21), #15 (1.18.22), #16 (03.17.22), #17 (11.14.22), #18 (11.14.22)  - S/P IVE OD #1 (09.16.20), #2 (10.16.20), #3 (11.13.20), #4 (12.11.20), #5 (02.08.21), #6 (03.12.21), #7 (04.16.21), #8 (05.14.21), #9 (06.18.21), #10 (07.23.21), #11 (08.30.21), #12 (10.05.21), #13 (11.10.21), #14 (12.15.21), #15 (01.18.22), #16 (03.17.22), #17 (5.12.22), #18 (08.02.22), #19 (11.14.22), #20 (11.14.22)  - OCT today shows OD: Mild interval improvement in central IRF/IRHM; OS: Interval resolution of  IRF, just trace cystic changes and IRHM remain at 12 wks  - BCVA 20/25 OU  - recommend IVE OU (OD #21, OS #19) today, 02.06.23 w/ f/u in 12 weeks  - pt in agreement  - Eylea4U benefits investigation completed and verified for 2022 as of 01.18.22  - RBA of procedure discussed, questions answered   - informed consent obtained  - see procedure note  - Eylea informed consent form signed and scanned on 12.11.2020  - f/u 12 weeks, DFE, OCT, possible injection  2,3. Hypertensive retinopathy OU  - discussed importance of tight BP control  - monitor  4. Pseudophakia OU  - s/p CE/IOL OU in [place], [place]  - beautiful surgeries, doing well  - monitor  Ophthalmic Meds Ordered this visit:  Meds ordered this encounter  Medications   aflibercept (EYLEA) SOLN 2 mg   aflibercept (EYLEA) SOLN 2 mg     Return in about 12 weeks (around 01/27/2022) for f/u NPDR OU, DFE, OCT.  There are no  Patient Instructions on file for this visit.  This document serves as a record of services personally performed by Gardiner Sleeper, MD, PhD. It was created on their behalf by Roselee Nova, COMT. The creation of this record is the provider's dictation and/or activities during the visit.  Electronically signed by: Roselee Nova, COMT 11/04/21 12:48 PM  This document serves as a record of services personally performed by Gardiner Sleeper, MD, PhD. It was created on their behalf by San Jetty. Owens Shark, OA an ophthalmic technician. The creation of this record is the provider's dictation and/or activities during the visit.    Electronically signed by: San Jetty. Owens Shark, [place] 02.06.2023 12:48 PM  Gardiner Sleeper, M.D., Ph.D. Diseases & Surgery of the Retina and Vitreous [facility]  I have reviewed the above documentation for accuracy and completeness, and I agree with the above. Gardiner Sleeper, M.D., Ph.D. 11/04/21 12:48 PM   Abbreviations: M myopia (nearsighted); A astigmatism; H hyperopia  (farsighted); P presbyopia; Mrx spectacle prescription;  CTL contact lenses; OD right eye; OS left eye; OU both eyes  XT exotropia; ET esotropia; PEK punctate epithelial keratitis; PEE punctate epithelial erosions; DES dry eye syndrome; MGD meibomian gland dysfunction; ATs artificial tears; PFAT's preservative free artificial tears; [place] nuclear sclerotic cataract; PSC posterior subcapsular cataract; ERM epi-retinal membrane; PVD posterior vitreous detachment; RD retinal detachment; DM diabetes mellitus; DR diabetic retinopathy; NPDR non-proliferative diabetic retinopathy; PDR proliferative diabetic retinopathy; CSME clinically significant macular edema; DME diabetic macular edema; dbh dot blot hemorrhages; CWS cotton wool spot; POAG primary open angle glaucoma; C/D cup-to-disc ratio; HVF humphrey visual field; GVF goldmann visual field; OCT optical coherence tomography; IOP intraocular pressure; BRVO Branch retinal vein occlusion; CRVO central retinal vein occlusion; CRAO central retinal artery occlusion; BRAO branch retinal artery occlusion; RT retinal tear; SB scleral buckle; PPV pars plana vitrectomy; VH Vitreous hemorrhage; PRP panretinal laser photocoagulation; IVK intravitreal kenalog; VMT vitreomacular traction; MH Macular hole;  NVD neovascularization of the disc; NVE neovascularization elsewhere; AREDS age related eye disease study; ARMD age related macular degeneration; POAG primary open angle glaucoma; EBMD epithelial/anterior basement membrane dystrophy; ACIOL anterior chamber intraocular lens; IOL intraocular lens; PCIOL posterior chamber intraocular lens; Phaco/IOL phacoemulsification with intraocular lens placement; [place] photorefractive keratectomy; LASIK laser assisted in situ keratomileusis; HTN hypertension; DM diabetes mellitus; COPD chronic obstructive pulmonary disease

## 2021-11-06 DIAGNOSIS — M48062 Spinal stenosis, lumbar region with neurogenic claudication: Secondary | ICD-10-CM | POA: Diagnosis not present

## 2021-11-13 ENCOUNTER — Other Ambulatory Visit (HOSPITAL_COMMUNITY): Payer: Self-pay | Admitting: Neurology

## 2021-11-13 ENCOUNTER — Other Ambulatory Visit: Payer: Self-pay | Admitting: Neurology

## 2021-11-13 ENCOUNTER — Other Ambulatory Visit (HOSPITAL_BASED_OUTPATIENT_CLINIC_OR_DEPARTMENT_OTHER): Payer: Self-pay | Admitting: Neurology

## 2021-11-13 DIAGNOSIS — R42 Dizziness and giddiness: Secondary | ICD-10-CM | POA: Diagnosis not present

## 2021-11-13 DIAGNOSIS — E1142 Type 2 diabetes mellitus with diabetic polyneuropathy: Secondary | ICD-10-CM | POA: Diagnosis not present

## 2021-11-13 DIAGNOSIS — H9319 Tinnitus, unspecified ear: Secondary | ICD-10-CM | POA: Diagnosis not present

## 2021-11-13 DIAGNOSIS — M47897 Other spondylosis, lumbosacral region: Secondary | ICD-10-CM | POA: Diagnosis not present

## 2021-11-19 ENCOUNTER — Telehealth: Payer: Self-pay | Admitting: Pharmacist

## 2021-11-19 NOTE — Telephone Encounter (Signed)
Med list reviewed and updated together with pt's daughter. Home BP readings continues to remain elevated. BP avg over the past two weeks of 163/81, HR: 68. BP currently poorly controlled on hydralazine 100 mg TID, isordil 30 mg TID, labetalol 200 mg BID, losartan-HCTZ 100/12.5 mg. Pt currently under significant back pain that has been chronic and has been poorly managed. Currently on on tylenol and using it 2-3 tabs daily without significant relief. Pt is being managed by spine specialist who said that it was more arthritis related. Pt to discuss with PCP about optimizing his pain treatment better [place]. To help manage pt's uncontrolled BP in the meantime, pt agreeable to increase isordil dose from 30 mg TID to 60 mg TID. Will continue remote home BP monitoring and follow up closely.

## 2021-11-21 DIAGNOSIS — I739 Peripheral vascular disease, unspecified: Secondary | ICD-10-CM | POA: Diagnosis not present

## 2021-11-25 ENCOUNTER — Other Ambulatory Visit: Payer: Self-pay | Admitting: Student

## 2021-11-29 DIAGNOSIS — I70223 Atherosclerosis of native arteries of extremities with rest pain, bilateral legs: Secondary | ICD-10-CM | POA: Diagnosis not present

## 2021-12-03 ENCOUNTER — Ambulatory Visit
Admission: RE | Admit: 2021-12-03 | Discharge: 2021-12-03 | Disposition: A | Discharge status: Home / Self Care | Payer: Medicare HMO | Source: Ambulatory Visit | Attending: Neurology | Admitting: Neurology

## 2021-12-03 DIAGNOSIS — I10 Essential (primary) hypertension: Secondary | ICD-10-CM | POA: Diagnosis not present

## 2021-12-03 DIAGNOSIS — R42 Dizziness and giddiness: Secondary | ICD-10-CM

## 2021-12-12 ENCOUNTER — Telehealth: Payer: Self-pay | Admitting: Student

## 2021-12-12 NOTE — Telephone Encounter (Signed)
Patient's daughter asking to speak with CC about a few things regarding possible surgery for patient. Says he has blockage in his legs, and his foot doctor is wanting to do a procedure for this. Please call her at [phone]. Thank you. ?

## 2021-12-13 NOTE — Telephone Encounter (Signed)
Can you please reach out to [facility] for records as well as [facility] in [place]? Patient is apparently scheduled to have peripheral arteriogram done and would rather it be done by Dr. Einar Gip as he has done this in the past. I want to get my hands on records to review to decide if we should get him scheduled for the procedure.

## 2021-12-18 DIAGNOSIS — E782 Mixed hyperlipidemia: Secondary | ICD-10-CM | POA: Diagnosis not present

## 2021-12-18 DIAGNOSIS — E1165 Type 2 diabetes mellitus with hyperglycemia: Secondary | ICD-10-CM | POA: Diagnosis not present

## 2021-12-18 DIAGNOSIS — Z794 Long term (current) use of insulin: Secondary | ICD-10-CM | POA: Diagnosis not present

## 2021-12-18 DIAGNOSIS — I10 Essential (primary) hypertension: Secondary | ICD-10-CM | POA: Diagnosis not present

## 2021-12-23 DIAGNOSIS — E1142 Type 2 diabetes mellitus with diabetic polyneuropathy: Secondary | ICD-10-CM | POA: Diagnosis not present

## 2021-12-23 DIAGNOSIS — M47897 Other spondylosis, lumbosacral region: Secondary | ICD-10-CM | POA: Diagnosis not present

## 2021-12-23 DIAGNOSIS — R2681 Unsteadiness on feet: Secondary | ICD-10-CM | POA: Diagnosis not present

## 2021-12-24 ENCOUNTER — Ambulatory Visit: Payer: Medicare Other

## 2021-12-24 ENCOUNTER — Other Ambulatory Visit: Payer: Self-pay

## 2021-12-24 DIAGNOSIS — I6523 Occlusion and stenosis of bilateral carotid arteries: Secondary | ICD-10-CM

## 2021-12-26 DIAGNOSIS — I739 Peripheral vascular disease, unspecified: Secondary | ICD-10-CM | POA: Diagnosis not present

## 2021-12-26 DIAGNOSIS — M19071 Primary osteoarthritis, right ankle and foot: Secondary | ICD-10-CM | POA: Diagnosis not present

## 2021-12-26 DIAGNOSIS — M792 Neuralgia and neuritis, unspecified: Secondary | ICD-10-CM | POA: Diagnosis not present

## 2021-12-26 DIAGNOSIS — L84 Corns and callosities: Secondary | ICD-10-CM | POA: Diagnosis not present

## 2021-12-26 DIAGNOSIS — B351 Tinea unguium: Secondary | ICD-10-CM | POA: Diagnosis not present

## 2021-12-26 DIAGNOSIS — L603 Nail dystrophy: Secondary | ICD-10-CM | POA: Diagnosis not present

## 2021-12-26 DIAGNOSIS — M19072 Primary osteoarthritis, left ankle and foot: Secondary | ICD-10-CM | POA: Diagnosis not present

## 2021-12-26 DIAGNOSIS — M79674 Pain in right toe(s): Secondary | ICD-10-CM | POA: Diagnosis not present

## 2021-12-26 DIAGNOSIS — M79675 Pain in left toe(s): Secondary | ICD-10-CM | POA: Diagnosis not present

## 2022-01-01 NOTE — H&P (View-Only) (Signed)
? ?Primary Physician:  Justin Mccreedy, MD ? ? ?Patient ID: Justin [name], male    DOB: 01-20-1933, 88 y.o.   MRN: [id number] ? ?Chief Complaint  ?Patient presents with  ? Follow-up  ?  6 month  ? Coronary Artery Disease  ? hld  ? carotid stenosis  ? ?HPI:  ? ? Justin [name]  is a 88 y.o. male  male with history of CAD, S/P CABG in 2009, PAD, type II diabetes with autonomic insufficiency and autonomic orthostatic hypotension, asymptomatic carotid stenosis, and hyperlipidemia.  He has patent graft except occluded SVG to RCA which is diffusely diseased small vessel and also severe below-knee bilateral lower disease by angiography on 11/04/2016 and repeat peripheral arteriogram on 01/03/20 and has severe slow flow suggestive of microvascular disease.  ? ?Patient presents for 44-monthfollow-up.  His primary concern today is worsening symptoms of claudication.  Patient is unable to walk short distances without claudication symptoms limiting him.  He also reports mild dyspnea on exertion.  Given claudication symptoms patient has been sedentary for the last few months.  He has also been dealing with chronic dizziness, presently being treated for vertigo by neurology and ENT.  Encourage patient to follow-up with them accordingly.  Patient's blood pressure is mildly elevated in the office today, however nephrology recently stopped chlorthalidone given episodes of hypotension.  On home monitoring patient's blood pressure is averaging 135-140 millimeters Hg systolic. ? ?Past Medical History:  ?Diagnosis Date  ? Anxiety   ? Arthritis   ? Carotid arterial disease ([place]   ? CKD (chronic kidney disease)   ? Coronary artery disease   ? Diabetes mellitus without complication ([facility]   ? Diabetic retinopathy ([facility]   ? NPDR OU  ? Diverticulitis   ? Dyspnea   ? GERD (gastroesophageal reflux disease)   ? History of kidney stones 2021  ? Hypercholesteremia   ? Hypertension   ? Hypertensive retinopathy   ? OU  ? Myocardial infarction  ([facility] 2009  ? Pneumonia   ? as a child  ? Prostate cancer ([facility]   ? UTI (lower urinary tract infection)   ? ?Past Surgical History:  ?Procedure Laterality Date  ? ABDOMINAL SURGERY    ? for diverticulitis; also removed appendix  ? APPENDECTOMY    ? CARDIAC CATHETERIZATION  2018  ? CATARACT EXTRACTION    ? CATARACT EXTRACTION, BILATERAL    ? CHOLECYSTECTOMY N/A 10/12/2017  ? Procedure: LAPAROSCOPIC CHOLECYSTECTOMY;  Surgeon: BCoralie Keens MD;  [place]  Service: General;  Laterality: N/A;  ? CORONARY ARTERY BYPASS GRAFT  2009  ? ERCP N/A 12/21/2018  ? Procedure: ENDOSCOPIC RETROGRADE CHOLANGIOPANCREATOGRAPHY (ERCP);  Surgeon: HCarol Ada MD;  [place]  Service: Endoscopy;  Laterality: N/A;  ? ESOPHAGOGASTRODUODENOSCOPY (EGD) WITH PROPOFOL N/A 12/17/2018  ? Procedure: ESOPHAGOGASTRODUODENOSCOPY (EGD) WITH PROPOFOL;  Surgeon: HCarol Ada MD;  [place]  Service: Endoscopy;  Laterality: N/A;  ? EUS Left 12/17/2018  ? Procedure: UPPER ENDOSCOPIC ULTRASOUND (EUS) LINEAR;  Surgeon: HCarol Ada MD;  [place] [facility]  Service: Endoscopy;  Laterality: Left;  ? EYE SURGERY    ? "for bleeding in eye"  ? HERNIA REPAIR    ? LEFT HEART CATH AND CORONARY ANGIOGRAPHY N/A 11/04/2016  ? Procedure: Left Heart Cath and Coronary Angiography;  Surgeon: JAdrian Prows MD;  [facility];  Service: Cardiovascular;  Laterality: N/A;  ? LOWER EXTREMITY ANGIOGRAPHY N/A 11/04/2016  ? Procedure: Lower Extremity Angiography;  Surgeon:  [name], MD;  Location: [place] [facility];  Service: Cardiovascular;  Laterality: N/A;  ? LOWER EXTREMITY ANGIOGRAPHY N/A 01/03/2020  ? Procedure: LOWER EXTREMITY ANGIOGRAPHY;  Surgeon: [name], MD;  Location: [place] [facility];  Service: Cardiovascular;  Laterality: N/A;  ? LUMBAR LAMINECTOMY/DECOMPRESSION MICRODISCECTOMY Bilateral 11/09/2020  ? Procedure: Laminectomy and Foraminotomy - bilateral - Lumbar Four-Five.;  Surgeon: Earnie Larsson, MD;   Location: [place];  Service: Neurosurgery;  Laterality: Bilateral;  posterior  ? PROSTATE SURGERY    ? REMOVAL OF STONES  12/21/2018  ? Procedure: REMOVAL OF STONES;  Surgeon: [name], MD;  Location: [place];  Service: Endoscopy;;  ? SPHINCTEROTOMY  12/21/2018  ? Procedure: SPHINCTEROTOMY;  Surgeon: [name], MD;  Location: [place];  Service: Endoscopy;;  ? ?Family History  ?Problem Relation Age of Onset  ? Diabetes Father   ? Diabetes Maternal Aunt   ? Diabetes Maternal Uncle   ? Diabetes Maternal Grandmother   ? Heart disease Sister   ? Diabetes Brother   ? Heart disease Sister   ? ?Social History  ? ?Tobacco Use  ? Smoking status: Former  ?  Packs/day: 0.25  ?  Years: 2.00  ?  Pack years: 0.50  ?  Types: Cigarettes  ? Smokeless tobacco: Never  ? Tobacco comments:  ?  when he was a teenage age [age]  ?Substance Use Topics  ? Alcohol use: No  ?  ?Marital Status: Widowed ? ?ROS  ? ?Review of Systems  ?Constitutional: Negative for malaise/fatigue and weight gain.  ?Cardiovascular:  Positive for claudication and dyspnea on exertion (chronic). Negative for chest pain, leg swelling, near-syncope, orthopnea, palpitations, paroxysmal nocturnal dyspnea and syncope.  ?Musculoskeletal:  Positive for back pain.  ?Gastrointestinal:  Negative for melena.  ?Neurological:  Positive for dizziness (chronic).  ?All other systems reviewed and are negative. ? ?Objective  ?Blood pressure (!) 147/60, pulse 80, temperature 98 ?F (36.7 ?C), resp. rate 17, height 5' 7.5" (1.715 m), weight 177 lb (80.3 kg), SpO2 98 %. Body mass index is 27.31 kg/m?.  ? ?  01/02/2022  ?  8:38 AM 10/24/2021  ? 11:15 AM 10/24/2021  ? 10:11 AM  ?Vitals with BMI  ?Height 5' 7.5"    ?Weight 177 lbs    ?BMI 27.3    ?Systolic [phone]  ?Diastolic 60 62 58  ?Pulse 80 65 69  ? ?Physical Exam ?Vitals reviewed.  ?Constitutional:   ?   Appearance: He is well-developed.  ?Cardiovascular:  ?   Rate and Rhythm: Normal rate and regular rhythm.  ?   Pulses:  Intact distal pulses.     ?     Carotid pulses are  on the right side with bruit and  on the left side with bruit. ?     Femoral pulses are 2+ on the right side and 2+ on the left side. ?     Popliteal pulses are 1+ on the right side and 1+ on the left side.  ?     Dorsalis pedis pulses are 0 on the right side and 0 on the left side.  ?     Posterior tibial pulses are 0 on the right side and 0 on the left side.  ?   Heart sounds: Normal heart sounds, S1 normal and S2 normal. No murmur heard. ?  No gallop.  ?   Comments: Split S2. ?NO JVD. ?Pulmonary:  ?   Effort: Pulmonary effort is normal. No accessory  muscle usage or respiratory distress.  ?   Breath sounds: Normal breath sounds. No wheezing, rhonchi or rales.  ?Musculoskeletal:  ?   Right lower leg: No edema.  ?   Left lower leg: No edema.  ?Skin: ?   Comments: No wounds or ulcers noted.  ?Neurological:  ?   Mental Status: He is alert.  ? ?Laboratory examination:  ? ? ? ?  Latest Ref Rng & Units 06/16/2021  ? 12:57 AM 06/14/2021  ?  1:14 PM 03/14/2021  ?  8:32 AM  ?CMP  ?Glucose 70 - 99 mg/dL 174   216   160    ?BUN 8 - 23 mg/dL 29   38   34    ?Creatinine 0.61 - 1.24 mg/dL 1.56   1.91   2.14    ?Sodium 135 - 145 mmol/L 137   136   138    ?Potassium 3.5 - 5.1 mmol/L 4.3   4.3   5.0    ?Chloride 98 - 111 mmol/L 108   108   109    ?CO2 22 - 32 mmol/L '20   19   16    '$ ?Calcium 8.9 - 10.3 mg/dL 9.1   9.2   9.0    ?Total Protein 6.5 - 8.1 g/dL 6.0   6.7     ?Total Bilirubin 0.3 - 1.2 mg/dL 0.5   0.4     ?Alkaline Phos 38 - 126 U/L 50   56     ?AST 15 - 41 U/L 17   15     ?ALT 0 - 44 U/L 14   14     ? ? ?  Latest Ref Rng & Units 06/16/2021  ? 12:57 AM 06/14/2021  ?  1:14 PM 03/10/2021  ? 11:18 AM  ?CBC  ?WBC 4.0 - 10.5 K/uL 9.1   8.6   7.0    ?Hemoglobin 13.0 - 17.0 g/dL 11.3   11.0   11.2    ?Hematocrit 39.0 - 52.0 % 34.0   34.5   35.9    ?Platelets 150 - 400 K/uL 187   198   159    ? ?Lipid Panel  ?   ?Component Value Date/Time  ? CHOL 130 03/14/2021 0832  ? TRIG 104  03/14/2021 0832  ? HDL 49 03/14/2021 0832  ? [place] 62 03/14/2021 0832  ? ?HEMOGLOBIN A1C ?Lab Results  ?Component Value Date  ? HGBA1C 7.2 (H) 11/06/2020  ? MPG 159.94 11/06/2020  ? ?TSH ?Recent Labs  ?  06/15/21 ?

## 2022-01-01 NOTE — Progress Notes (Signed)
? ?Primary Physician:  [name], MD ? ? ?Patient ID: Justin [name], male    DOB: 1933-07-31, 88 y.o.   MRN: [id number] ? ?Chief Complaint  ?Patient presents with  ? Follow-up  ?  6 month  ? Coronary Artery Disease  ? hld  ? carotid stenosis  ? ?HPI:  ? ? Justin [name]  is a 88 y.o. male  male with history of CAD, S/P CABG in 2009, PAD, type II diabetes with autonomic insufficiency and autonomic orthostatic hypotension, asymptomatic carotid stenosis, and hyperlipidemia.  He has patent graft except occluded SVG to RCA which is diffusely diseased small vessel and also severe below-knee bilateral lower disease by angiography on 11/04/2016 and repeat peripheral arteriogram on 01/03/20 and has severe slow flow suggestive of microvascular disease.  ? ?Patient presents for 24-monthfollow-up.  His primary concern today is worsening symptoms of claudication.  Patient is unable to walk short distances without claudication symptoms limiting him.  He also reports mild dyspnea on exertion.  Given claudication symptoms patient has been sedentary for the last few months.  He has also been dealing with chronic dizziness, presently being treated for vertigo by neurology and ENT.  Encourage patient to follow-up with them accordingly.  Patient's blood pressure is mildly elevated in the office today, however nephrology recently stopped chlorthalidone given episodes of hypotension.  On home monitoring patient's blood pressure is averaging 135-140 millimeters Hg systolic. ? ?Past Medical History:  ?Diagnosis Date  ? Anxiety   ? Arthritis   ? Carotid arterial disease ([place]   ? CKD (chronic kidney disease)   ? Coronary artery disease   ? Diabetes mellitus without complication ([facility]   ? Diabetic retinopathy ([facility]   ? NPDR OU  ? Diverticulitis   ? Dyspnea   ? GERD (gastroesophageal reflux disease)   ? History of kidney stones 2021  ? Hypercholesteremia   ? Hypertension   ? Hypertensive retinopathy   ? OU  ? Myocardial infarction  ([facility] 2009  ? Pneumonia   ? as a child  ? Prostate cancer ([facility]   ? UTI (lower urinary tract infection)   ? ?Past Surgical History:  ?Procedure Laterality Date  ? ABDOMINAL SURGERY    ? for diverticulitis; also removed appendix  ? APPENDECTOMY    ? CARDIAC CATHETERIZATION  2018  ? CATARACT EXTRACTION    ? CATARACT EXTRACTION, BILATERAL    ? CHOLECYSTECTOMY N/A 10/12/2017  ? Procedure: LAPAROSCOPIC CHOLECYSTECTOMY;  Surgeon: BCoralie Keens MD;  [place]  Service: General;  Laterality: N/A;  ? CORONARY ARTERY BYPASS GRAFT  2009  ? ERCP N/A 12/21/2018  ? Procedure: ENDOSCOPIC RETROGRADE CHOLANGIOPANCREATOGRAPHY (ERCP);  Surgeon: HCarol Ada MD;  [place]  Service: Endoscopy;  Laterality: N/A;  ? ESOPHAGOGASTRODUODENOSCOPY (EGD) WITH PROPOFOL N/A 12/17/2018  ? Procedure: ESOPHAGOGASTRODUODENOSCOPY (EGD) WITH PROPOFOL;  Surgeon: HCarol Ada MD;  Location: [place]  Service: Endoscopy;  Laterality: N/A;  ? EUS Left 12/17/2018  ? Procedure: UPPER ENDOSCOPIC ULTRASOUND (EUS) LINEAR;  Surgeon: HCarol Ada MD;  [place]  Service: Endoscopy;  Laterality: Left;  ? EYE SURGERY    ? "for bleeding in eye"  ? HERNIA REPAIR    ? LEFT HEART CATH AND CORONARY ANGIOGRAPHY N/A 11/04/2016  ? Procedure: Left Heart Cath and Coronary Angiography;  Surgeon: JAdrian Prows MD;  [facility];  Service: Cardiovascular;  Laterality: N/A;  ? LOWER EXTREMITY ANGIOGRAPHY N/A 11/04/2016  ? Procedure: Lower Extremity Angiography;  Surgeon:  [name], MD;  Location: [place] [facility];  Service: Cardiovascular;  Laterality: N/A;  ? LOWER EXTREMITY ANGIOGRAPHY N/A 01/03/2020  ? Procedure: LOWER EXTREMITY ANGIOGRAPHY;  Surgeon: [name], MD;  Location: [place] [facility];  Service: Cardiovascular;  Laterality: N/A;  ? LUMBAR LAMINECTOMY/DECOMPRESSION MICRODISCECTOMY Bilateral 11/09/2020  ? Procedure: Laminectomy and Foraminotomy - bilateral - Lumbar Four-Five.;  Surgeon: Earnie Larsson, MD;   Location: [place];  Service: Neurosurgery;  Laterality: Bilateral;  posterior  ? PROSTATE SURGERY    ? REMOVAL OF STONES  12/21/2018  ? Procedure: REMOVAL OF STONES;  Surgeon: [name], MD;  Location: [place];  Service: Endoscopy;;  ? SPHINCTEROTOMY  12/21/2018  ? Procedure: SPHINCTEROTOMY;  Surgeon: [name], MD;  Location: [place];  Service: Endoscopy;;  ? ?Family History  ?Problem Relation Age of Onset  ? Diabetes Father   ? Diabetes Maternal Aunt   ? Diabetes Maternal Uncle   ? Diabetes Maternal Grandmother   ? Heart disease Sister   ? Diabetes Brother   ? Heart disease Sister   ? ?Social History  ? ?Tobacco Use  ? Smoking status: Former  ?  Packs/day: 0.25  ?  Years: 2.00  ?  Pack years: 0.50  ?  Types: Cigarettes  ? Smokeless tobacco: Never  ? Tobacco comments:  ?  when he was a teenage age [age]  ?Substance Use Topics  ? Alcohol use: No  ?  ?Marital Status: Widowed ? ?ROS  ? ?Review of Systems  ?Constitutional: Negative for malaise/fatigue and weight gain.  ?Cardiovascular:  Positive for claudication and dyspnea on exertion (chronic). Negative for chest pain, leg swelling, near-syncope, orthopnea, palpitations, paroxysmal nocturnal dyspnea and syncope.  ?Musculoskeletal:  Positive for back pain.  ?Gastrointestinal:  Negative for melena.  ?Neurological:  Positive for dizziness (chronic).  ?All other systems reviewed and are negative. ? ?Objective  ?Blood pressure (!) 147/60, pulse 80, temperature 98 ?F (36.7 ?C), resp. rate 17, height 5' 7.5" (1.715 m), weight 177 lb (80.3 kg), SpO2 98 %. Body mass index is 27.31 kg/m?.  ? ?  01/02/2022  ?  8:38 AM 10/24/2021  ? 11:15 AM 10/24/2021  ? 10:11 AM  ?Vitals with BMI  ?Height 5' 7.5"    ?Weight 177 lbs    ?BMI 27.3    ?Systolic [phone]  ?Diastolic 60 62 58  ?Pulse 80 65 69  ? ?Physical Exam ?Vitals reviewed.  ?Constitutional:   ?   Appearance: He is well-developed.  ?Cardiovascular:  ?   Rate and Rhythm: Normal rate and regular rhythm.  ?   Pulses:  Intact distal pulses.     ?     Carotid pulses are  on the right side with bruit and  on the left side with bruit. ?     Femoral pulses are 2+ on the right side and 2+ on the left side. ?     Popliteal pulses are 1+ on the right side and 1+ on the left side.  ?     Dorsalis pedis pulses are 0 on the right side and 0 on the left side.  ?     Posterior tibial pulses are 0 on the right side and 0 on the left side.  ?   Heart sounds: Normal heart sounds, S1 normal and S2 normal. No murmur heard. ?  No gallop.  ?   Comments: Split S2. ?NO JVD. ?Pulmonary:  ?   Effort: Pulmonary effort is normal. No accessory  muscle usage or respiratory distress.  ?   Breath sounds: Normal breath sounds. No wheezing, rhonchi or rales.  ?Musculoskeletal:  ?   Right lower leg: No edema.  ?   Left lower leg: No edema.  ?Skin: ?   Comments: No wounds or ulcers noted.  ?Neurological:  ?   Mental Status: He is alert.  ? ?Laboratory examination:  ? ? ? ?  Latest Ref Rng & Units 06/16/2021  ? 12:57 AM 06/14/2021  ?  1:14 PM 03/14/2021  ?  8:32 AM  ?CMP  ?Glucose 70 - 99 mg/dL 174   216   160    ?BUN 8 - 23 mg/dL 29   38   34    ?Creatinine 0.61 - 1.24 mg/dL 1.56   1.91   2.14    ?Sodium 135 - 145 mmol/L 137   136   138    ?Potassium 3.5 - 5.1 mmol/L 4.3   4.3   5.0    ?Chloride 98 - 111 mmol/L 108   108   109    ?CO2 22 - 32 mmol/L '20   19   16    '$ ?Calcium 8.9 - 10.3 mg/dL 9.1   9.2   9.0    ?Total Protein 6.5 - 8.1 g/dL 6.0   6.7     ?Total Bilirubin 0.3 - 1.2 mg/dL 0.5   0.4     ?Alkaline Phos 38 - 126 U/L 50   56     ?AST 15 - 41 U/L 17   15     ?ALT 0 - 44 U/L 14   14     ? ? ?  Latest Ref Rng & Units 06/16/2021  ? 12:57 AM 06/14/2021  ?  1:14 PM 03/10/2021  ? 11:18 AM  ?CBC  ?WBC 4.0 - 10.5 K/uL 9.1   8.6   7.0    ?Hemoglobin 13.0 - 17.0 g/dL 11.3   11.0   11.2    ?Hematocrit 39.0 - 52.0 % 34.0   34.5   35.9    ?Platelets 150 - 400 K/uL 187   198   159    ? ?Lipid Panel  ?   ?Component Value Date/Time  ? CHOL 130 03/14/2021 0832  ? TRIG 104  03/14/2021 0832  ? HDL 49 03/14/2021 0832  ? [place] 62 03/14/2021 0832  ? ?HEMOGLOBIN A1C ?Lab Results  ?Component Value Date  ? HGBA1C 7.2 (H) 11/06/2020  ? MPG 159.94 11/06/2020  ? ?TSH ?Recent Labs  ?  06/15/21 ?

## 2022-01-02 ENCOUNTER — Ambulatory Visit: Payer: Medicare Other | Admitting: Student

## 2022-01-02 ENCOUNTER — Encounter: Payer: Self-pay | Admitting: Student

## 2022-01-02 VITALS — BP 147/60 | HR 80 | Temp 98.0°F | Resp 17 | Ht 67.5 in | Wt 177.0 lb

## 2022-01-02 DIAGNOSIS — R0609 Other forms of dyspnea: Secondary | ICD-10-CM

## 2022-01-02 DIAGNOSIS — I6521 Occlusion and stenosis of right carotid artery: Secondary | ICD-10-CM

## 2022-01-02 DIAGNOSIS — N183 Chronic kidney disease, stage 3 unspecified: Secondary | ICD-10-CM | POA: Diagnosis not present

## 2022-01-02 DIAGNOSIS — I739 Peripheral vascular disease, unspecified: Secondary | ICD-10-CM

## 2022-01-02 DIAGNOSIS — R079 Chest pain, unspecified: Secondary | ICD-10-CM | POA: Diagnosis not present

## 2022-01-03 DIAGNOSIS — I10 Essential (primary) hypertension: Secondary | ICD-10-CM | POA: Diagnosis not present

## 2022-01-03 LAB — CBC
Hematocrit: 32 % — ABNORMAL LOW (ref 37.5–51.0)
Hemoglobin: 10.5 g/dL — ABNORMAL LOW (ref 13.0–17.7)
MCH: 29.8 pg (ref 26.6–33.0)
MCHC: 32.8 g/dL (ref 31.5–35.7)
MCV: 91 fL (ref 79–97)
Platelets: 177 10*3/uL (ref 150–450)
RBC: 3.52 x10E6/uL — ABNORMAL LOW (ref 4.14–5.80)
RDW: 12.2 % (ref 11.6–15.4)
WBC: 6.7 10*3/uL (ref 3.4–10.8)

## 2022-01-03 LAB — BASIC METABOLIC PANEL
BUN/Creatinine Ratio: 20 (ref 10–24)
BUN: 28 mg/dL — ABNORMAL HIGH (ref 8–27)
CO2: 18 mmol/L — ABNORMAL LOW (ref 20–29)
Calcium: 9.5 mg/dL (ref 8.6–10.2)
Chloride: 109 mmol/L — ABNORMAL HIGH (ref 96–106)
Creatinine, Ser: 1.42 mg/dL — ABNORMAL HIGH (ref 0.76–1.27)
Glucose: 140 mg/dL — ABNORMAL HIGH (ref 70–99)
Potassium: 5 mmol/L (ref 3.5–5.2)
Sodium: 140 mmol/L (ref 134–144)
eGFR: 48 mL/min/{1.73_m2} — ABNORMAL LOW (ref >59)

## 2022-01-03 LAB — BRAIN NATRIURETIC PEPTIDE: BNP: 123.9 pg/mL — ABNORMAL HIGH (ref 0.0–100.0)

## 2022-01-06 NOTE — Progress Notes (Signed)
Called pt spoke with daughter. Pt understood

## 2022-01-13 DIAGNOSIS — N2581 Secondary hyperparathyroidism of renal origin: Secondary | ICD-10-CM | POA: Diagnosis not present

## 2022-01-13 DIAGNOSIS — I129 Hypertensive chronic kidney disease with stage 1 through stage 4 chronic kidney disease, or unspecified chronic kidney disease: Secondary | ICD-10-CM | POA: Diagnosis not present

## 2022-01-13 DIAGNOSIS — N183 Chronic kidney disease, stage 3 unspecified: Secondary | ICD-10-CM | POA: Diagnosis not present

## 2022-01-13 DIAGNOSIS — D631 Anemia in chronic kidney disease: Secondary | ICD-10-CM | POA: Diagnosis not present

## 2022-01-14 ENCOUNTER — Ambulatory Visit (HOSPITAL_COMMUNITY)
Admission: RE | Admit: 2022-01-14 | Discharge: 2022-01-14 | Disposition: A | Discharge status: Home / Self Care | Payer: Medicare Other | Attending: Cardiology | Admitting: Cardiology

## 2022-01-14 ENCOUNTER — Other Ambulatory Visit: Payer: Self-pay

## 2022-01-14 ENCOUNTER — Ambulatory Visit (HOSPITAL_COMMUNITY)
Admission: RE | Disposition: A | Discharge status: Home / Self Care | Payer: Self-pay | Source: Home / Self Care | Attending: Cardiology

## 2022-01-14 ENCOUNTER — Encounter (HOSPITAL_COMMUNITY): Payer: Self-pay | Admitting: Cardiology

## 2022-01-14 DIAGNOSIS — I129 Hypertensive chronic kidney disease with stage 1 through stage 4 chronic kidney disease, or unspecified chronic kidney disease: Secondary | ICD-10-CM | POA: Insufficient documentation

## 2022-01-14 DIAGNOSIS — R0609 Other forms of dyspnea: Secondary | ICD-10-CM | POA: Diagnosis not present

## 2022-01-14 DIAGNOSIS — Z7984 Long term (current) use of oral hypoglycemic drugs: Secondary | ICD-10-CM | POA: Insufficient documentation

## 2022-01-14 DIAGNOSIS — N189 Chronic kidney disease, unspecified: Secondary | ICD-10-CM | POA: Diagnosis not present

## 2022-01-14 DIAGNOSIS — E1151 Type 2 diabetes mellitus with diabetic peripheral angiopathy without gangrene: Secondary | ICD-10-CM | POA: Diagnosis not present

## 2022-01-14 DIAGNOSIS — Z951 Presence of aortocoronary bypass graft: Secondary | ICD-10-CM | POA: Insufficient documentation

## 2022-01-14 DIAGNOSIS — E1122 Type 2 diabetes mellitus with diabetic chronic kidney disease: Secondary | ICD-10-CM | POA: Insufficient documentation

## 2022-01-14 DIAGNOSIS — I252 Old myocardial infarction: Secondary | ICD-10-CM | POA: Insufficient documentation

## 2022-01-14 DIAGNOSIS — Z87891 Personal history of nicotine dependence: Secondary | ICD-10-CM | POA: Diagnosis not present

## 2022-01-14 DIAGNOSIS — I70203 Unspecified atherosclerosis of native arteries of extremities, bilateral legs: Secondary | ICD-10-CM | POA: Diagnosis not present

## 2022-01-14 DIAGNOSIS — I739 Peripheral vascular disease, unspecified: Secondary | ICD-10-CM | POA: Diagnosis present

## 2022-01-14 DIAGNOSIS — I2510 Atherosclerotic heart disease of native coronary artery without angina pectoris: Secondary | ICD-10-CM | POA: Insufficient documentation

## 2022-01-14 DIAGNOSIS — E785 Hyperlipidemia, unspecified: Secondary | ICD-10-CM | POA: Insufficient documentation

## 2022-01-14 DIAGNOSIS — I6521 Occlusion and stenosis of right carotid artery: Secondary | ICD-10-CM | POA: Diagnosis not present

## 2022-01-14 HISTORY — PX: LOWER EXTREMITY ANGIOGRAPHY: CATH118251

## 2022-01-14 LAB — GLUCOSE, CAPILLARY
Glucose-Capillary: 152 mg/dL — ABNORMAL HIGH (ref 70–99)
Glucose-Capillary: 185 mg/dL — ABNORMAL HIGH (ref 70–99)

## 2022-01-14 SURGERY — LOWER EXTREMITY ANGIOGRAPHY
Anesthesia: LOCAL | Laterality: Bilateral

## 2022-01-14 MED ORDER — SODIUM CHLORIDE 0.9 % IV SOLN: 250.0000 mL | INTRAVENOUS | Status: DC | PRN

## 2022-01-14 MED ORDER — DIPHENHYDRAMINE HCL 50 MG/ML IJ SOLN
25.0000 mg | Freq: Once | INTRAMUSCULAR | Status: AC
  Administered 2022-01-14: 25 mg via INTRAVENOUS
  Filled 2022-01-14: qty 1

## 2022-01-14 MED ORDER — FENTANYL CITRATE (PF) 100 MCG/2ML IJ SOLN
INTRAMUSCULAR | Status: AC
  Filled 2022-01-14: qty 2

## 2022-01-14 MED ORDER — SODIUM CHLORIDE 0.9% FLUSH: 3.0000 mL | Freq: Two times a day (BID) | INTRAVENOUS | Status: DC

## 2022-01-14 MED ORDER — SODIUM CHLORIDE 0.9 % IV SOLN: INTRAVENOUS | Status: AC

## 2022-01-14 MED ORDER — ONDANSETRON HCL 4 MG/2ML IJ SOLN: 4.0000 mg | Freq: Four times a day (QID) | INTRAMUSCULAR | Status: DC | PRN

## 2022-01-14 MED ORDER — SODIUM CHLORIDE 0.9% FLUSH: 3.0000 mL | INTRAVENOUS | Status: DC | PRN

## 2022-01-14 MED ORDER — MIDAZOLAM HCL 2 MG/2ML IJ SOLN
INTRAMUSCULAR | Status: DC | PRN
  Administered 2022-01-14 (×2): 1 mg via INTRAVENOUS

## 2022-01-14 MED ORDER — LIDOCAINE HCL (PF) 1 % IJ SOLN
INTRAMUSCULAR | Status: DC | PRN
  Administered 2022-01-14: 20 mL

## 2022-01-14 MED ORDER — LABETALOL HCL 5 MG/ML IV SOLN
INTRAVENOUS | Status: AC
  Filled 2022-01-14: qty 4

## 2022-01-14 MED ORDER — METHYLPREDNISOLONE SODIUM SUCC 125 MG IJ SOLR
125.0000 mg | Freq: Once | INTRAMUSCULAR | Status: AC
  Administered 2022-01-14: 125 mg via INTRAVENOUS
  Filled 2022-01-14: qty 2

## 2022-01-14 MED ORDER — HYDRALAZINE HCL 20 MG/ML IJ SOLN: 5.0000 mg | INTRAMUSCULAR | Status: DC | PRN

## 2022-01-14 MED ORDER — SODIUM CHLORIDE 0.9 % WEIGHT BASED INFUSION
3.0000 mL/kg/h | INTRAVENOUS | Status: AC
  Administered 2022-01-14: 3 mL/kg/h via INTRAVENOUS

## 2022-01-14 MED ORDER — SODIUM CHLORIDE 0.9 % WEIGHT BASED INFUSION: 1.0000 mL/kg/h | INTRAVENOUS | Status: DC

## 2022-01-14 MED ORDER — ASPIRIN 81 MG PO CHEW
81.0000 mg | CHEWABLE_TABLET | ORAL | Status: AC
  Administered 2022-01-14: 81 mg via ORAL
  Filled 2022-01-14: qty 1

## 2022-01-14 MED ORDER — HYDRALAZINE HCL 20 MG/ML IJ SOLN
INTRAMUSCULAR | Status: AC
  Filled 2022-01-14: qty 1

## 2022-01-14 MED ORDER — ACETAMINOPHEN 325 MG PO TABS: 650.0000 mg | ORAL_TABLET | ORAL | Status: DC | PRN

## 2022-01-14 MED ORDER — LABETALOL HCL 5 MG/ML IV SOLN: INTRAVENOUS | Status: DC | PRN

## 2022-01-14 MED ORDER — LIDOCAINE HCL (PF) 1 % IJ SOLN
INTRAMUSCULAR | Status: AC
  Filled 2022-01-14: qty 30

## 2022-01-14 MED ORDER — HYDRALAZINE HCL 20 MG/ML IJ SOLN
INTRAMUSCULAR | Status: DC | PRN
  Administered 2022-01-14 (×3): 10 mg via INTRAVENOUS

## 2022-01-14 MED ORDER — MIDAZOLAM HCL 2 MG/2ML IJ SOLN
INTRAMUSCULAR | Status: AC
  Filled 2022-01-14: qty 2

## 2022-01-14 MED ORDER — HEPARIN (PORCINE) IN NACL 1000-0.9 UT/500ML-% IV SOLN
INTRAVENOUS | Status: AC
  Filled 2022-01-14: qty 1000

## 2022-01-14 MED ORDER — HEPARIN (PORCINE) IN NACL 1000-0.9 UT/500ML-% IV SOLN
INTRAVENOUS | Status: DC | PRN
  Administered 2022-01-14 (×2): 500 mL

## 2022-01-14 MED ORDER — FENTANYL CITRATE (PF) 100 MCG/2ML IJ SOLN
INTRAMUSCULAR | Status: DC | PRN
Start: 2022-01-14 — End: 2022-01-14
  Administered 2022-01-14: 25 ug via INTRAVENOUS

## 2022-01-14 SURGICAL SUPPLY — 15 items
CATH OMNI FLUSH 5F 65CM (CATHETERS) ×1 IMPLANT
CLOSURE MYNX CONTROL 5F (Vascular Products) ×1 IMPLANT
GLIDEWIRE NITREX 0.018X80X5 (WIRE) ×1
GUIDEWIRE NITREX 0.018X80X5 (WIRE) IMPLANT
KIT ANGIASSIST CO2 SYSTEM (KITS) ×1 IMPLANT
KIT MICROPUNCTURE NIT STIFF (SHEATH) ×1 IMPLANT
KIT PV (KITS) ×2 IMPLANT
SHEATH PINNACLE 5F 10CM (SHEATH) ×1 IMPLANT
SHEATH PROBE COVER 6X72 (BAG) ×1 IMPLANT
STOPCOCK MORSE 400PSI 3WAY (MISCELLANEOUS) ×1 IMPLANT
SYR MEDRAD MARK 7 150ML (SYRINGE) ×2 IMPLANT
TRANSDUCER W/STOPCOCK (MISCELLANEOUS) ×2 IMPLANT
TRAY PV CATH (CUSTOM PROCEDURE TRAY) ×2 IMPLANT
TUBING CIL FLEX 10 FLL-RA (TUBING) ×1 IMPLANT
WIRE HITORQ VERSACORE ST 145CM (WIRE) ×1 IMPLANT

## 2022-01-14 NOTE — Interval H&P Note (Signed)
History and Physical Interval Note: ? ?01/14/2022 ?7:40 AM ? ?Justin [name]  has presented today for surgery, with the diagnosis of carotid stenosis.  The various methods of treatment have been discussed with the patient and family. After consideration of risks, benefits and other options for treatment, the patient has consented to  Procedure(s): ?Lower Extremity Angiography (Bilateral) and possible angioplasty as a surgical intervention.  The patient's history has been reviewed, patient examined, no change in status, stable for surgery.  I have reviewed the patient's chart and labs.  Questions were answered to the patient's satisfaction.   ? ? ?[name] ? ? ?

## 2022-01-15 MED FILL — Labetalol HCl IV Soln Prefilled Syringe 20 MG/4ML (5 MG/ML): INTRAVENOUS | Qty: 4 | Status: AC

## 2022-01-20 DIAGNOSIS — M545 Low back pain, unspecified: Secondary | ICD-10-CM | POA: Diagnosis not present

## 2022-01-22 DIAGNOSIS — I739 Peripheral vascular disease, unspecified: Secondary | ICD-10-CM | POA: Diagnosis not present

## 2022-01-22 DIAGNOSIS — M19071 Primary osteoarthritis, right ankle and foot: Secondary | ICD-10-CM | POA: Diagnosis not present

## 2022-01-22 DIAGNOSIS — M79674 Pain in right toe(s): Secondary | ICD-10-CM | POA: Diagnosis not present

## 2022-01-22 DIAGNOSIS — M19072 Primary osteoarthritis, left ankle and foot: Secondary | ICD-10-CM | POA: Diagnosis not present

## 2022-01-22 DIAGNOSIS — M792 Neuralgia and neuritis, unspecified: Secondary | ICD-10-CM | POA: Diagnosis not present

## 2022-01-22 DIAGNOSIS — G629 Polyneuropathy, unspecified: Secondary | ICD-10-CM | POA: Diagnosis not present

## 2022-01-22 DIAGNOSIS — M79675 Pain in left toe(s): Secondary | ICD-10-CM | POA: Diagnosis not present

## 2022-01-28 ENCOUNTER — Encounter: Payer: Self-pay | Admitting: Student

## 2022-01-28 ENCOUNTER — Ambulatory Visit: Payer: Medicare Other | Admitting: Student

## 2022-01-28 VITALS — BP 194/82 | HR 59 | Temp 98.0°F | Resp 17 | Ht 67.5 in | Wt 176.0 lb

## 2022-01-28 DIAGNOSIS — I6523 Occlusion and stenosis of bilateral carotid arteries: Secondary | ICD-10-CM | POA: Diagnosis not present

## 2022-01-28 DIAGNOSIS — I10 Essential (primary) hypertension: Secondary | ICD-10-CM

## 2022-01-28 DIAGNOSIS — I6521 Occlusion and stenosis of right carotid artery: Secondary | ICD-10-CM

## 2022-01-28 NOTE — Progress Notes (Signed)
? ?Primary Physician:  [name], MD ? ? ?Patient ID: Justin [name], male    DOB: 02-16-1933, 88 y.o.   MRN: [id number] ? ?Chief Complaint  ?Patient presents with  ? Follow-up  ? post PV  ? ?HPI:  ? ? Justin [name]  is a 88 y.o. male  male with history of CAD, S/P CABG in 2009, PAD, type II diabetes with autonomic insufficiency and autonomic orthostatic hypotension, asymptomatic carotid stenosis, and hyperlipidemia.  He has patent graft except occluded SVG to RCA which is diffusely diseased small vessel and also severe below-knee bilateral lower disease by angiography on 11/04/2016 and repeat peripheral arteriogram on 01/03/20 and has severe slow flow suggestive of microvascular disease.  ? ?Patient was last seen 01/02/2022 with complaints of worsening claudication symptoms, therefore patient underwent lower extremity angiography by Dr. Einar Gip which revealed no significant PAD, recommended evaluation for nonvascular causes of patient's pseudoclaudication symptoms.  Also given dyspnea on exertion at last office visit ordered BNP BMP and CBC which were all essentially normal, BNP mildly elevated.  ? ?Patient's blood pressure is quite elevated today, he saw nephrology yesterday who increased losartan to 50 mg p.o. twice daily.  Patient is in significant constant pain from his back, which radiates down his legs.  Reports dizziness has improved since last office visit. ? ?Patient is enrolled in remote patient monitoring of blood pressure. ? ?Past Medical History:  ?Diagnosis Date  ? Anxiety   ? Arthritis   ? Carotid arterial disease ([place])   ? CKD (chronic kidney disease)   ? Coronary artery disease   ? Diabetes mellitus without complication ([place])   ? Diabetic retinopathy ([place])   ? NPDR OU  ? Diverticulitis   ? Dyspnea   ? GERD (gastroesophageal reflux disease)   ? History of kidney stones 2021  ? Hypercholesteremia   ? Hypertension   ? Hypertensive retinopathy   ? OU  ? Myocardial infarction [facility]) 2009  ? Pneumonia    ? as a child  ? Prostate cancer ([place])   ? UTI (lower urinary tract infection)   ? ?Past Surgical History:  ?Procedure Laterality Date  ? ABDOMINAL SURGERY    ? for diverticulitis; also removed appendix  ? APPENDECTOMY    ? CARDIAC CATHETERIZATION  2018  ? CATARACT EXTRACTION    ? CATARACT EXTRACTION, BILATERAL    ? CHOLECYSTECTOMY N/A 10/12/2017  ? Procedure: LAPAROSCOPIC CHOLECYSTECTOMY;  Surgeon: [name], MD;  Location: [place];  Service: General;  Laterality: N/A;  ? CORONARY ARTERY BYPASS GRAFT  2009  ? ERCP N/A 12/21/2018  ? Procedure: ENDOSCOPIC RETROGRADE CHOLANGIOPANCREATOGRAPHY (ERCP);  Surgeon: [name], MD;  Location: [place];  Service: Endoscopy;  Laterality: N/A;  ? ESOPHAGOGASTRODUODENOSCOPY (EGD) WITH PROPOFOL N/A 12/17/2018  ? Procedure: ESOPHAGOGASTRODUODENOSCOPY (EGD) WITH PROPOFOL;  Surgeon: [name], MD;  Location: [place];  Service: Endoscopy;  Laterality: N/A;  ? EUS Left 12/17/2018  ? Procedure: UPPER ENDOSCOPIC ULTRASOUND (EUS) LINEAR;  Surgeon: [name], MD;  Location: [place];  Service: Endoscopy;  Laterality: Left;  ? EYE SURGERY    ? "for bleeding in eye"  ? HERNIA REPAIR    ? LEFT HEART CATH AND CORONARY ANGIOGRAPHY N/A 11/04/2016  ? Procedure: Left Heart Cath and Coronary Angiography;  Surgeon: [name], MD;  Location: [place] [facility];  Service: Cardiovascular;  Laterality: N/A;  ? LOWER EXTREMITY ANGIOGRAPHY N/A 11/04/2016  ? Procedure: Lower Extremity Angiography;  Surgeon: [name], MD;  Location: [place]  CV LAB;  Service: Cardiovascular;  Laterality: N/A;  ? LOWER EXTREMITY ANGIOGRAPHY N/A 01/03/2020  ? Procedure: LOWER EXTREMITY ANGIOGRAPHY;  Surgeon: [name], MD;  Location: [place] [facility];  Service: Cardiovascular;  Laterality: N/A;  ? LOWER EXTREMITY ANGIOGRAPHY Bilateral 01/14/2022  ? Procedure: Lower Extremity Angiography;  Surgeon: [name], MD;  Location: [place] [facility];  Service: Cardiovascular;  Laterality: Bilateral;  ?  LUMBAR LAMINECTOMY/DECOMPRESSION MICRODISCECTOMY Bilateral 11/09/2020  ? Procedure: Laminectomy and Foraminotomy - bilateral - Lumbar Four-Five.;  Surgeon: Earnie Larsson, MD;  Location: [place];  Service: Neurosurgery;  Laterality: Bilateral;  posterior  ? PROSTATE SURGERY    ? REMOVAL OF STONES  12/21/2018  ? Procedure: REMOVAL OF STONES;  Surgeon: [name], MD;  Location: [place];  Service: Endoscopy;;  ? SPHINCTEROTOMY  12/21/2018  ? Procedure: SPHINCTEROTOMY;  Surgeon: [name], MD;  Location: [place];  Service: Endoscopy;;  ? ?Family History  ?Problem Relation Age of Onset  ? Diabetes Father   ? Diabetes Maternal Aunt   ? Diabetes Maternal Uncle   ? Diabetes Maternal Grandmother   ? Heart disease Sister   ? Diabetes Brother   ? Heart disease Sister   ? ?Social History  ? ?Tobacco Use  ? Smoking status: Former  ?  Packs/day: 0.25  ?  Years: 2.00  ?  Pack years: 0.50  ?  Types: Cigarettes  ? Smokeless tobacco: Never  ? Tobacco comments:  ?  when he was a teenage age [age]  ?Substance Use Topics  ? Alcohol use: No  ?  ?Marital Status: Widowed ? ?ROS  ? ?Review of Systems  ?Constitutional: Negative for malaise/fatigue and weight gain.  ?Cardiovascular:  Positive for claudication and dyspnea on exertion (chronic). Negative for chest pain, leg swelling, near-syncope, orthopnea, palpitations, paroxysmal nocturnal dyspnea and syncope.  ?Musculoskeletal:  Positive for back pain (severe).  ?Gastrointestinal:  Negative for melena.  ?Neurological:  Positive for dizziness (improved).  ? ?Objective  ?Blood pressure (!) 194/82, pulse (!) 59, temperature 98 ?F (36.7 ?C), temperature source Temporal, resp. rate 17, height 5' 7.5" (1.715 m), weight 176 lb (79.8 kg), SpO2 99 %. Body mass index is 27.16 kg/m?.  ? ?  01/28/2022  ?  8:49 AM 01/28/2022  ?  8:34 AM 01/14/2022  ? 12:50 PM  ?Vitals with BMI  ?Height  5' 7.5"   ?Weight  176 lbs   ?BMI  27.14   ?Systolic 782 956   ?Diastolic 82 69   ?Pulse 59 61 63  ? ?Physical  Exam ?Vitals reviewed.  ?Constitutional:   ?   Appearance: He is well-developed.  ?Cardiovascular:  ?   Rate and Rhythm: Normal rate and regular rhythm.  ?   Pulses: Intact distal pulses.     ?     Carotid pulses are  on the right side with bruit and  on the left side with bruit. ?     Femoral pulses are 2+ on the right side and 2+ on the left side. ?     Popliteal pulses are 1+ on the right side and 1+ on the left side.  ?     Dorsalis pedis pulses are 0 on the right side and 0 on the left side.  ?     Posterior tibial pulses are 0 on the right side and 0 on the left side.  ?   Heart sounds: Normal heart sounds, S1 normal and S2 normal. No murmur heard. ?  No  gallop.  ?   Comments: Split S2. ?NO JVD. ?PV site healing well ?Pulmonary:  ?   Effort: Pulmonary effort is normal. No accessory muscle usage or respiratory distress.  ?   Breath sounds: Normal breath sounds. No wheezing, rhonchi or rales.  ?Musculoskeletal:  ?   Right lower leg: Edema (trace ankle) present.  ?   Left lower leg: Edema (trace ankle) present.  ?Skin: ?   Comments: No wounds or ulcers noted.  ?Neurological:  ?   Mental Status: He is alert.  ? ?Laboratory examination:  ? ? ? ?  Latest Ref Rng & Units 01/02/2022  ? 10:18 AM 06/16/2021  ? 12:57 AM 06/14/2021  ?  1:14 PM  ?CMP  ?Glucose 70 - 99 mg/dL 140   174   216    ?BUN 8 - 27 mg/dL 28   29   38    ?Creatinine 0.76 - 1.27 mg/dL 1.42   1.56   1.91    ?Sodium 134 - 144 mmol/L 140   137   136    ?Potassium 3.5 - 5.2 mmol/L 5.0   4.3   4.3    ?Chloride 96 - 106 mmol/L 109   108   108    ?CO2 20 - 29 mmol/L '18   20   19    '$ ?Calcium 8.6 - 10.2 mg/dL 9.5   9.1   9.2    ?Total Protein 6.5 - 8.1 g/dL  6.0   6.7    ?Total Bilirubin 0.3 - 1.2 mg/dL  0.5   0.4    ?Alkaline Phos 38 - 126 U/L  50   56    ?AST 15 - 41 U/L  17   15    ?ALT 0 - 44 U/L  14   14    ? ? ?  Latest Ref Rng & Units 01/02/2022  ? 10:18 AM 06/16/2021  ? 12:57 AM 06/14/2021  ?  1:14 PM  ?CBC  ?WBC 3.4 - 10.8 x10E3/uL 6.7   9.1   8.6     ?Hemoglobin 13.0 - 17.7 g/dL 10.5   11.3   11.0    ?Hematocrit 37.5 - 51.0 % 32.0   34.0   34.5    ?Platelets 150 - 450 x10E3/uL 177   187   198    ? ?Lipid Panel  ?   ?Component Value Date/Time  ? CHOL 130 06/16

## 2022-01-29 DIAGNOSIS — G629 Polyneuropathy, unspecified: Secondary | ICD-10-CM | POA: Diagnosis not present

## 2022-01-29 NOTE — Progress Notes (Addendum)
?[facility] Note ? ?02/03/2022 ? ?  ? ?CHIEF COMPLAINT ?Patient presents for Retina Follow Up ? ? ? ?HISTORY OF PRESENT ILLNESS: ?Justin [name] is a 88 y.o. male who presents to the clinic today for:  ? ?HPI   ? ? Retina Follow Up   ?Patient presents with  Diabetic Retinopathy (IVE OU 02.26.23).  In both eyes.  This started years ago.  Duration of 13 weeks.  I, the attending physician,  performed the HPI with the patient and updated documentation appropriately. ? ?  ?  ? ? Comments   ?Patient feels that the vision is the same since his last visit 13 weeks ago. His blood sugar was 130 this morning and his A1C 6.7.  ? ?  ?  ?Last edited by [name], MD on 02/03/2022  1:30 PM.  ?  ?Patient states vision the same OU. ? ?Referring physician: ?[name], MD ?[address] ?[address] ?[place],  [place] [address] ? ?HISTORICAL INFORMATION:  ? ?Selected notes from the [facility] ?Referred by Dr. [name] for concern of mac edema OU  ? ?CURRENT MEDICATIONS: ?No current outpatient medications on file. (Ophthalmic Drugs)  ? ?No current facility-administered medications for this visit. (Ophthalmic Drugs)  ? ?Current Outpatient Medications (Other)  ?Medication Sig  ? ACCU-CHEK SMARTVIEW test strip   ? Accu-Chek Softclix Lancets lancets   ? acetaminophen (TYLENOL) 500 MG tablet Take 1,000 mg by mouth 2 (two) times daily as needed for mild pain.  ? Blood Glucose Monitoring Suppl (ACCU-CHEK GUIDE) w/Device KIT   ? cholecalciferol (VITAMIN D3) 25 MCG (1000 UNIT) tablet Take 1,000 Units by mouth daily.  ? clonazePAM (KLONOPIN) 0.5 MG tablet Take 0.5 mg by mouth daily as needed for anxiety.  ? docusate sodium (COLACE) 50 MG capsule Take 50 mg by mouth 2 (two) times daily.  ? DULoxetine (CYMBALTA) 30 MG capsule Take 60 mg by mouth daily.  ? ezetimibe (ZETIA) 10 MG tablet TAKE 1 TABLET DAILY AFTER SUPPER (Patient taking differently: Take 10 mg by mouth every evening.)  ? hydrALAZINE  (APRESOLINE) 50 MG tablet TAKE 1 TABLET THREE TIMES DAILY (Patient taking differently: Take 100 mg by mouth 3 (three) times daily.)  ? isosorbide mononitrate (IMDUR) 30 MG 24 hr tablet 1 tab(s) orally 2 times a day  ? labetalol (NORMODYNE) 100 MG tablet Take 2 tablets (200 mg total) by mouth 2 (two) times daily.  ? LANTUS SOLOSTAR 100 UNIT/ML Solostar Pen Inject 10 Units into the skin 2 (two) times daily. Inject 10 units in the morning and 10 units in the evening.  ? linaclotide (LINZESS) 145 MCG CAPS capsule Take 145 mcg by mouth daily as needed (constipation).  ? losartan (COZAAR) 50 MG tablet Take 50 mg by mouth in the morning and at bedtime.  ? meclizine (ANTIVERT) 25 MG tablet Take 1 tablet (25 mg total) by mouth 3 (three) times daily as needed for dizziness.  ? omeprazole (PRILOSEC) 40 MG capsule Take 40 mg by mouth daily.   ? Pyridoxine HCl (B-6 PO) Take 1 capsule by mouth daily.  ? simvastatin (ZOCOR) 20 MG tablet Take 20 mg by mouth at bedtime.  ? tamsulosin (FLOMAX) 0.4 MG CAPS capsule Take 0.4 mg by mouth daily.  ? Turmeric (QC TUMERIC COMPLEX PO) Take 1 capsule by mouth daily.  ? vitamin B-12 (CYANOCOBALAMIN) 500 MCG tablet Take 500 mcg by mouth daily.  ? Vitamin E 268 MG (400 UNIT) CAPS  Take 400 Units by mouth daily.  ? XARELTO 2.5 MG TABS tablet TAKE 1 TABLET TWICE DAILY (Patient taking differently: Take 2.5 mg by mouth 2 (two) times daily.)  ? ?No current facility-administered medications for this visit. (Other)  ? ?REVIEW OF SYSTEMS: ?ROS   ?Positive for: Gastrointestinal, Genitourinary, Endocrine, Cardiovascular, Eyes ?Negative for: Constitutional, Neurological, Skin, Musculoskeletal, HENT, Respiratory, Psychiatric, Allergic/Imm, Heme/Lymph ?Last edited by Annie Paras, COT on 02/03/2022  9:49 AM.  ?  ? ? ?ALLERGIES ?Allergies  ?Allergen Reactions  ? Contrast Media [Iodinated Contrast Media] Itching  ?  PT STATES ALLERGY TO "CT DYE".  ? Amlodipine Swelling  ?  Leg swelling  ? Dye Fdc Red [Red  Dye] Swelling  ?  CAT scan dye  ? Ibuprofen Other (See Comments)  ?  Upset stomach   ? ?PAST MEDICAL HISTORY ?Past Medical History:  ?Diagnosis Date  ? Anxiety   ? Arthritis   ? Carotid arterial disease ([place])   ? CKD (chronic kidney disease)   ? Coronary artery disease   ? Diabetes mellitus without complication ([place])   ? Diabetic retinopathy ([place])   ? NPDR OU  ? Diverticulitis   ? Dyspnea   ? GERD (gastroesophageal reflux disease)   ? History of kidney stones 2021  ? Hypercholesteremia   ? Hypertension   ? Hypertensive retinopathy   ? OU  ? Myocardial infarction [facility]) 2009  ? Pneumonia   ? as a child  ? Prostate cancer ([place])   ? UTI (lower urinary tract infection)   ? ?Past Surgical History:  ?Procedure Laterality Date  ? ABDOMINAL SURGERY    ? for diverticulitis; also removed appendix  ? APPENDECTOMY    ? CARDIAC CATHETERIZATION  2018  ? CATARACT EXTRACTION    ? CATARACT EXTRACTION, BILATERAL    ? CHOLECYSTECTOMY N/A 10/12/2017  ? Procedure: LAPAROSCOPIC CHOLECYSTECTOMY;  Surgeon: [name], MD;  Location: [place];  Service: General;  Laterality: N/A;  ? CORONARY ARTERY BYPASS GRAFT  2009  ? ERCP N/A 12/21/2018  ? Procedure: ENDOSCOPIC RETROGRADE CHOLANGIOPANCREATOGRAPHY (ERCP);  Surgeon: [name], MD;  Location: [place];  Service: Endoscopy;  Laterality: N/A;  ? ESOPHAGOGASTRODUODENOSCOPY (EGD) WITH PROPOFOL N/A 12/17/2018  ? Procedure: ESOPHAGOGASTRODUODENOSCOPY (EGD) WITH PROPOFOL;  Surgeon: [name], MD;  Location: [place];  Service: Endoscopy;  Laterality: N/A;  ? EUS Left 12/17/2018  ? Procedure: UPPER ENDOSCOPIC ULTRASOUND (EUS) LINEAR;  Surgeon: [name], MD;  Location: [place];  Service: Endoscopy;  Laterality: Left;  ? EYE SURGERY    ? "for bleeding in eye"  ? HERNIA REPAIR    ? LEFT HEART CATH AND CORONARY ANGIOGRAPHY N/A 11/04/2016  ? Procedure: Left Heart Cath and Coronary Angiography;  Surgeon: [name], MD;  Location: [place] [facility];  Service: Cardiovascular;   Laterality: N/A;  ? LOWER EXTREMITY ANGIOGRAPHY N/A 11/04/2016  ? Procedure: Lower Extremity Angiography;  Surgeon: [name], MD;  Location: [place] [facility];  Service: Cardiovascular;  Laterality: N/A;  ? LOWER EXTREMITY ANGIOGRAPHY N/A 01/03/2020  ? Procedure: LOWER EXTREMITY ANGIOGRAPHY;  Surgeon: [name], MD;  Location: [place] [facility];  Service: Cardiovascular;  Laterality: N/A;  ? LOWER EXTREMITY ANGIOGRAPHY Bilateral 01/14/2022  ? Procedure: Lower Extremity Angiography;  Surgeon: [name], MD;  Location: [place] [facility];  Service: Cardiovascular;  Laterality: Bilateral;  ? LUMBAR LAMINECTOMY/DECOMPRESSION MICRODISCECTOMY Bilateral 11/09/2020  ? Procedure: Laminectomy and Foraminotomy - bilateral - Lumbar Four-Five.;  Surgeon: Earnie Larsson, MD;  Location: [facility]  OR;  Service: Neurosurgery;  Laterality: Bilateral;  posterior  ? PROSTATE SURGERY    ? REMOVAL OF STONES  12/21/2018  ? Procedure: REMOVAL OF STONES;  Surgeon: [name], MD;  Location: [place];  Service: Endoscopy;;  ? SPHINCTEROTOMY  12/21/2018  ? Procedure: SPHINCTEROTOMY;  Surgeon: [name], MD;  Location: [place];  Service: Endoscopy;;  ? ?FAMILY HISTORY ?Family History  ?Problem Relation Age of Onset  ? Diabetes Father   ? Diabetes Maternal Aunt   ? Diabetes Maternal Uncle   ? Diabetes Maternal Grandmother   ? Heart disease Sister   ? Diabetes Brother   ? Heart disease Sister   ? ?SOCIAL HISTORY ?Social History  ? ?Tobacco Use  ? Smoking status: Former  ?  Packs/day: 0.25  ?  Years: 2.00  ?  Pack years: 0.50  ?  Types: Cigarettes  ? Smokeless tobacco: Never  ? Tobacco comments:  ?  when he was a teenage age [age]  ?Vaping Use  ? Vaping Use: Never used  ?Substance Use Topics  ? Alcohol use: No  ? Drug use: No  ?  ? ?  ?OPHTHALMIC EXAM: ?Base Eye Exam   ? ? Visual Acuity (Snellen - Linear)   ? ?   Right Left  ? Dist cc 20/25 +2 20/25 +2  ? ? Correction: Glasses  ? ?  ?  ? ? Tonometry (Tonopen, 9:55 AM)   ? ?   Right Left  ?  Pressure 9 11  ? ?  ?  ? ? Pupils   ? ?   Dark Light Shape React APD  ? Right 3 2 Round Brisk None  ? Left 3 2 Round Brisk None  ? ?  ?  ? ? Visual Fields   ? ?   Left Right  ?  Full Full  ? ?  ?  ? ? Extraocular

## 2022-01-30 DIAGNOSIS — E1165 Type 2 diabetes mellitus with hyperglycemia: Secondary | ICD-10-CM | POA: Diagnosis not present

## 2022-01-30 DIAGNOSIS — Z794 Long term (current) use of insulin: Secondary | ICD-10-CM | POA: Diagnosis not present

## 2022-01-30 DIAGNOSIS — I10 Essential (primary) hypertension: Secondary | ICD-10-CM | POA: Diagnosis not present

## 2022-01-30 DIAGNOSIS — E782 Mixed hyperlipidemia: Secondary | ICD-10-CM | POA: Diagnosis not present

## 2022-01-31 DIAGNOSIS — I10 Essential (primary) hypertension: Secondary | ICD-10-CM | POA: Diagnosis not present

## 2022-01-31 DIAGNOSIS — I509 Heart failure, unspecified: Secondary | ICD-10-CM | POA: Diagnosis not present

## 2022-01-31 DIAGNOSIS — Z1159 Encounter for screening for other viral diseases: Secondary | ICD-10-CM | POA: Diagnosis not present

## 2022-01-31 DIAGNOSIS — E559 Vitamin D deficiency, unspecified: Secondary | ICD-10-CM | POA: Diagnosis not present

## 2022-01-31 DIAGNOSIS — M159 Polyosteoarthritis, unspecified: Secondary | ICD-10-CM | POA: Diagnosis not present

## 2022-01-31 DIAGNOSIS — H9190 Unspecified hearing loss, unspecified ear: Secondary | ICD-10-CM | POA: Diagnosis not present

## 2022-01-31 DIAGNOSIS — N1831 Chronic kidney disease, stage 3a: Secondary | ICD-10-CM | POA: Diagnosis not present

## 2022-01-31 DIAGNOSIS — Z79899 Other long term (current) drug therapy: Secondary | ICD-10-CM | POA: Diagnosis not present

## 2022-01-31 DIAGNOSIS — E1142 Type 2 diabetes mellitus with diabetic polyneuropathy: Secondary | ICD-10-CM | POA: Diagnosis not present

## 2022-01-31 DIAGNOSIS — E11311 Type 2 diabetes mellitus with unspecified diabetic retinopathy with macular edema: Secondary | ICD-10-CM | POA: Diagnosis not present

## 2022-01-31 DIAGNOSIS — E1122 Type 2 diabetes mellitus with diabetic chronic kidney disease: Secondary | ICD-10-CM | POA: Diagnosis not present

## 2022-02-02 DIAGNOSIS — I10 Essential (primary) hypertension: Secondary | ICD-10-CM | POA: Diagnosis not present

## 2022-02-03 ENCOUNTER — Encounter (INDEPENDENT_AMBULATORY_CARE_PROVIDER_SITE_OTHER): Payer: Self-pay | Admitting: Ophthalmology

## 2022-02-03 ENCOUNTER — Ambulatory Visit (INDEPENDENT_AMBULATORY_CARE_PROVIDER_SITE_OTHER): Payer: Medicare Other | Admitting: Ophthalmology

## 2022-02-03 DIAGNOSIS — Z961 Presence of intraocular lens: Secondary | ICD-10-CM

## 2022-02-03 DIAGNOSIS — E113313 Type 2 diabetes mellitus with moderate nonproliferative diabetic retinopathy with macular edema, bilateral: Secondary | ICD-10-CM

## 2022-02-03 DIAGNOSIS — H35033 Hypertensive retinopathy, bilateral: Secondary | ICD-10-CM

## 2022-02-03 DIAGNOSIS — I10 Essential (primary) hypertension: Secondary | ICD-10-CM

## 2022-02-03 MED ORDER — AFLIBERCEPT 2MG/0.05ML IZ SOLN FOR KALEIDOSCOPE
2.0000 mg | INTRAVITREAL | Status: AC | PRN
  Administered 2022-02-03: 2 mg via INTRAVITREAL

## 2022-02-07 DIAGNOSIS — I10 Essential (primary) hypertension: Secondary | ICD-10-CM | POA: Diagnosis not present

## 2022-02-07 DIAGNOSIS — N1831 Chronic kidney disease, stage 3a: Secondary | ICD-10-CM | POA: Diagnosis not present

## 2022-02-07 DIAGNOSIS — E113313 Type 2 diabetes mellitus with moderate nonproliferative diabetic retinopathy with macular edema, bilateral: Secondary | ICD-10-CM | POA: Diagnosis not present

## 2022-02-07 DIAGNOSIS — I509 Heart failure, unspecified: Secondary | ICD-10-CM | POA: Diagnosis not present

## 2022-02-07 DIAGNOSIS — Z23 Encounter for immunization: Secondary | ICD-10-CM | POA: Diagnosis not present

## 2022-02-07 DIAGNOSIS — E1122 Type 2 diabetes mellitus with diabetic chronic kidney disease: Secondary | ICD-10-CM | POA: Diagnosis not present

## 2022-02-07 DIAGNOSIS — Z0001 Encounter for general adult medical examination with abnormal findings: Secondary | ICD-10-CM | POA: Diagnosis not present

## 2022-02-07 DIAGNOSIS — I6523 Occlusion and stenosis of bilateral carotid arteries: Secondary | ICD-10-CM | POA: Diagnosis not present

## 2022-02-07 DIAGNOSIS — I5032 Chronic diastolic (congestive) heart failure: Secondary | ICD-10-CM | POA: Diagnosis not present

## 2022-02-12 NOTE — Progress Notes (Signed)
01/31/2022: ?Total cholesterol 143, HDL 59, LDL 70, triglycerides 61 ?BUN 33, creatinine 1.43, GFR 48, sodium 141, potassium 5.0 ?Hgb 10.4, HCT 32.5, MCV 93.7, platelet 187 ?TSH 0.99 ?BNP 165 ?Vitamin D 36

## 2022-02-13 DIAGNOSIS — G629 Polyneuropathy, unspecified: Secondary | ICD-10-CM | POA: Diagnosis not present

## 2022-03-05 ENCOUNTER — Ambulatory Visit: Payer: Medicare Other | Admitting: Student

## 2022-04-25 ENCOUNTER — Other Ambulatory Visit: Payer: Self-pay

## 2022-04-25 DIAGNOSIS — I10 Essential (primary) hypertension: Secondary | ICD-10-CM

## 2022-04-25 MED ORDER — LOSARTAN POTASSIUM 50 MG PO TABS: 50.0000 mg | ORAL_TABLET | Freq: Two times a day (BID) | ORAL | 0 refills | Status: DC

## 2022-04-25 NOTE — Progress Notes (Signed)
Patient is coming in on 8/3 for a visit and only wanted to send in enough refill medication until then in case any med changes are made during the visit.

## 2022-04-30 ENCOUNTER — Ambulatory Visit: Payer: Medicare Other | Admitting: Student

## 2022-04-30 NOTE — Progress Notes (Signed)
[facility] Note  05/05/2022     CHIEF COMPLAINT Patient presents for Retina Follow Up    HISTORY OF PRESENT ILLNESS: Justin [name] is a 88 y.o. male who presents to the clinic today for:   HPI     Retina Follow Up   Patient presents with  Diabetic Retinopathy.  In both eyes.  This started 13 weeks ago.  I, the attending physician,  performed the HPI with the patient and updated documentation appropriately.        Comments   Patient here for 13 weeks for retina follow up for NPDR OU. Patient states vision about the same. No eye pain.       Last edited by [name], MD on 05/05/2022  1:24 PM.    Pt states no change in vision  Referring physician: [name], MD [address] [place],  [place] [id number]  HISTORICAL INFORMATION:   Selected notes from the MEDICAL RECORD NUMBER Referred by Dr. [name] for concern of mac edema OU   CURRENT MEDICATIONS: No current outpatient medications on file. (Ophthalmic Drugs)   No current facility-administered medications for this visit. (Ophthalmic Drugs)   Current Outpatient Medications (Other)  Medication Sig   ACCU-CHEK SMARTVIEW test strip    Accu-Chek Softclix Lancets lancets    acetaminophen (TYLENOL) 500 MG tablet Take 1,000 mg by mouth 2 (two) times daily as needed for mild pain.   Blood Glucose Monitoring Suppl (ACCU-CHEK GUIDE) w/Device KIT    cholecalciferol (VITAMIN D3) 25 MCG (1000 UNIT) tablet Take 1,000 Units by mouth daily.   cilostazol (PLETAL) 50 MG tablet Take 1 tablet (50 mg total) by mouth 2 (two) times daily.   clonazePAM (KLONOPIN) 0.5 MG tablet Take 0.5 mg by mouth daily as needed for anxiety.   docusate sodium (COLACE) 50 MG capsule Take 50 mg by mouth 2 (two) times daily.   DULoxetine (CYMBALTA) 30 MG capsule Take 2 capsules (60 mg total) by mouth daily.   ezetimibe (ZETIA) 10 MG tablet TAKE 1 TABLET DAILY AFTER SUPPER (Patient taking differently: Take  10 mg by mouth every evening.)   hydrALAZINE (APRESOLINE) 100 MG tablet Take 1 tablet (100 mg total) by mouth 3 (three) times daily.   isosorbide dinitrate (ISORDIL) 30 MG tablet Take 1 tablet (30 mg total) by mouth 3 (three) times daily.   labetalol (NORMODYNE) 200 MG tablet Take 1 tablet (200 mg total) by mouth 2 (two) times daily.   LANTUS SOLOSTAR 100 UNIT/ML Solostar Pen Inject 10 Units into the skin 2 (two) times daily. Inject 10 units in the morning and 10 units in the evening.   linaclotide (LINZESS) 145 MCG CAPS capsule Take 145 mcg by mouth daily as needed (constipation).   meclizine (ANTIVERT) 25 MG tablet Take 1 tablet (25 mg total) by mouth 3 (three) times daily as needed for dizziness.   omeprazole (PRILOSEC) 40 MG capsule Take 40 mg by mouth daily.    Pyridoxine HCl (B-6 PO) Take 1 capsule by mouth daily.   simvastatin (ZOCOR) 20 MG tablet Take 20 mg by mouth at bedtime.   tamsulosin (FLOMAX) 0.4 MG CAPS capsule Take 1 capsule (0.4 mg total) by mouth daily after supper.   Turmeric (QC TUMERIC COMPLEX PO) Take 1 capsule by mouth daily.   vitamin B-12 (CYANOCOBALAMIN) 500 MCG tablet Take 500 mcg by mouth daily.   Vitamin E 268 MG (400 UNIT) CAPS Take 400 Units by mouth daily.  losartan (COZAAR) 50 MG tablet TAKE 1 TABLET BY MOUTH IN THE MORNING AND AT BEDTIME   No current facility-administered medications for this visit. (Other)   REVIEW OF SYSTEMS: ROS   Positive for: Gastrointestinal, Genitourinary, Endocrine, Cardiovascular, Eyes Negative for: Constitutional, Neurological, Skin, Musculoskeletal, HENT, Respiratory, Psychiatric, Allergic/Imm, Heme/Lymph Last edited by [name], COA on 05/05/2022 10:06 AM.     ALLERGIES Allergies  Allergen Reactions   Contrast Media [Iodinated Contrast Media] Itching    PT STATES ALLERGY TO "CT DYE".   Amlodipine Swelling    Leg swelling   Dye Fdc Red [Red Dye] Swelling    CAT scan dye   Ibuprofen Other (See Comments)    Upset  stomach    PAST MEDICAL HISTORY Past Medical History:  Diagnosis Date   Anxiety    Arthritis    Carotid arterial disease ([place])    CKD (chronic kidney disease)    Coronary artery disease    Diabetes mellitus without complication (HCC)    Diabetic retinopathy ([place])    NPDR OU   Diverticulitis    Dyspnea    GERD (gastroesophageal reflux disease)    History of kidney stones 2021   Hypercholesteremia    Hypertension    Hypertensive retinopathy    OU   Myocardial infarction [facility]) 2009   Pneumonia    as a child   Prostate cancer ([place])    UTI (lower urinary tract infection)    Past Surgical History:  Procedure Laterality Date   ABDOMINAL SURGERY     for diverticulitis; also removed appendix   APPENDECTOMY     CARDIAC CATHETERIZATION  2018   CATARACT EXTRACTION     CATARACT EXTRACTION, BILATERAL     CHOLECYSTECTOMY N/A 10/12/2017   Procedure: LAPAROSCOPIC CHOLECYSTECTOMY;  Surgeon: [name], MD;  Location: [place];  Service: General;  Laterality: N/A;   CORONARY ARTERY BYPASS GRAFT  2009   ERCP N/A 12/21/2018   Procedure: ENDOSCOPIC RETROGRADE CHOLANGIOPANCREATOGRAPHY (ERCP);  Surgeon: [name], MD;  Location: [place];  Service: Endoscopy;  Laterality: N/A;   ESOPHAGOGASTRODUODENOSCOPY (EGD) WITH PROPOFOL N/A 12/17/2018   Procedure: ESOPHAGOGASTRODUODENOSCOPY (EGD) WITH PROPOFOL;  Surgeon: [name], MD;  Location: [place];  Service: Endoscopy;  Laterality: N/A;   EUS Left 12/17/2018   Procedure: UPPER ENDOSCOPIC ULTRASOUND (EUS) LINEAR;  Surgeon: [name], MD;  Location: [place];  Service: Endoscopy;  Laterality: Left;   EYE SURGERY     "for bleeding in eye"   HERNIA REPAIR     LEFT HEART CATH AND CORONARY ANGIOGRAPHY N/A 11/04/2016   Procedure: Left Heart Cath and Coronary Angiography;  Surgeon: [name], MD;  Location: [place] [facility];  Service: Cardiovascular;  Laterality: N/A;   LOWER EXTREMITY ANGIOGRAPHY N/A 11/04/2016   Procedure: Lower  Extremity Angiography;  Surgeon: [name], MD;  Location: [place] [facility];  Service: Cardiovascular;  Laterality: N/A;   LOWER EXTREMITY ANGIOGRAPHY N/A 01/03/2020   Procedure: LOWER EXTREMITY ANGIOGRAPHY;  Surgeon: [name], MD;  Location: [place] [facility];  Service: Cardiovascular;  Laterality: N/A;   LOWER EXTREMITY ANGIOGRAPHY Bilateral 01/14/2022   Procedure: Lower Extremity Angiography;  Surgeon: [name], MD;  Location: [place] [facility];  Service: Cardiovascular;  Laterality: Bilateral;   LUMBAR LAMINECTOMY/DECOMPRESSION MICRODISCECTOMY Bilateral 11/09/2020   Procedure: Laminectomy and Foraminotomy - bilateral - Lumbar Four-Five.;  Surgeon: Earnie Larsson, MD;  Location: [place];  Service: Neurosurgery;  Laterality: Bilateral;  posterior   PROSTATE SURGERY  REMOVAL OF STONES  12/21/2018   Procedure: REMOVAL OF STONES;  Surgeon: [name], MD;  Location: [place];  Service: Endoscopy;;   SPHINCTEROTOMY  12/21/2018   Procedure: [name];  Surgeon: [name], MD;  Location: [facility] ENDOSCOPY;  Service: Endoscopy;;   FAMILY HISTORY Family History  Problem Relation Age of Onset   Diabetes Father    Diabetes Maternal Aunt    Diabetes Maternal Uncle    Diabetes Maternal Grandmother    Heart disease Sister    Diabetes Brother    Heart disease Sister    SOCIAL HISTORY Social History   Tobacco Use   Smoking status: Former    Packs/day: 0.25    Years: 2.00    Total pack years: 0.50    Types: Cigarettes   Smokeless tobacco: Never   Tobacco comments:    when he was a teenage age [age]  Vaping Use   Vaping Use: Never used  Substance Use Topics   Alcohol use: No   Drug use: No       OPHTHALMIC EXAM: Base Eye Exam     Visual Acuity (Snellen - Linear)       Right Left   Dist cc 20/30 -2 20/25   Dist ph cc 20/25 -2     Correction: Glasses         Tonometry (Tonopen, 10:04 AM)       Right Left   Pressure 14 15         Pupils       Dark Light  Shape React APD   Right 3 2 Round Brisk None   Left 3 2 Round Brisk None         Visual Fields (Counting fingers)       Left Right    Full Full         Extraocular Movement       Right Left    Full, Ortho Full, Ortho         Neuro/Psych     Oriented x3: Yes   Mood/Affect: Normal         Dilation     Both eyes: 1.0% Mydriacyl, 2.5% Phenylephrine @ 10:04 AM           Slit Lamp and Fundus Exam     Slit Lamp Exam       Right Left   Lids/Lashes Dermatochalasis - upper lid, Meibomian gland dysfunction Dermatochalasis - upper lid, Meibomian gland dysfunction   Conjunctiva/Sclera Melanosis Melanosis   Cornea Arcus, trace Punctate epithelial erosions, mild endopigment Arcus, 1-2+ Punctate epithelial erosions, trace endopigment, mild corneal haze   Anterior Chamber Deep and quiet Deep and quiet   Iris Mild Temporal Iris atrophy, Round and dilated, No NVI Mild Temporal Iris atrophy, Round and dilated, No NVI   Lens PC IOL in good position with mild aterior capsule fimosis PC IOL in good position with mild aterior capsule fimosis   Anterior Vitreous Vitreous syneresis, Posterior vitreous detachment, vitreous condensations Vitreous syneresis, Posterior vitreous detachment, vitreous condensations         Fundus Exam       Right Left   Disc pallor with sharp rim, Peripapillary atrophy, Compact mild pallor, sharp rim, Tilted disc, mild temporal Peripapillary atrophy, Compact   C/D Ratio 0.2 0.3   Macula Blunted foveal reflex, Retinal pigment epithelial mottling and clumping, Microaneurysms -- improved, persistent central cystic changes -- slightly increaed good foveal reflex, focal area of RPE atrophy SN to fovea, rare  Microaneurysms, Retinal pigment epithelial mottling, refractile Epiretinal membrane, central cystic changes -  stably improved, no edema   Vessels Vascular attenuation, Tortuous, AV crossing changes, mild Copper wiring attenuated, Tortuous   Periphery  Attached, rare, scattered MA, good peripheral PRP 360 - room for posterior fill in if needed Attached, rare, scattered MA, Peripheral PRP 360 -- room for posterior fill in if needed, focal, white-centered IRH IN to disc--improved           Refraction     Wearing Rx       Sphere Cylinder Axis Add   Right -0.75 +1.75 175 +3.00   Left -0.25 +1.50 180 +3.00    Type: Bifocal           IMAGING AND PROCEDURES  Imaging and Procedures for _0 @  OCT, Retina - OU - Both Eyes       Right Eye Quality was good. Central Foveal Thickness: 304. Progression has worsened. Findings include no SRF, abnormal foveal contour, retinal drusen , intraretinal fluid, outer retinal atrophy (Mild interval increase in central IRF/IRHM).   Left Eye Quality was good. Central Foveal Thickness: 240. Progression has been stable. Findings include normal foveal contour, no IRF, no SRF, retinal drusen , intraretinal hyper-reflective material, outer retinal atrophy (stable resolution of IRF, just trace IRHM remain).   Notes *Images captured and stored on drive  Diagnosis / Impression:  DME OU, OD>OS OD: Mild interval increase in central IRF/IRHM OS: stable resolution of IRF, just trace IRHM remain  Clinical management:  See below  Abbreviations: NFP - Normal foveal profile. CME - cystoid macular edema. PED - pigment epithelial detachment. IRF - intraretinal fluid. SRF - subretinal fluid. EZ - ellipsoid zone. ERM - epiretinal membrane. ORA - outer retinal atrophy. ORT - outer retinal tubulation. SRHM - subretinal hyper-reflective material       Intravitreal Injection, Pharmacologic Agent - OD - Right Eye       Time Out 05/05/2022. 10:54 AM. Confirmed correct patient, procedure, site, and patient consented.   Anesthesia Topical anesthesia was used. Anesthetic medications included Lidocaine 2%, Proparacaine 0.5%.   Procedure Preparation included 5% betadine to ocular surface, eyelid speculum. A  (32g) needle was used.   Injection: 2 mg aflibercept 2 MG/0.05ML   Route: Intravitreal, Site: Right Eye   NDC: A3590391, Lot: [id number], Expiration date: 06/29/2023, Waste: 0 mL   Post-op Post injection exam found visual acuity of at least counting fingers. The patient tolerated the procedure well. There were no complications. The patient received written and verbal post procedure care education. Post injection medications were not given.            ASSESSMENT/PLAN:    ICD-10-CM   1. Moderate nonproliferative diabetic retinopathy of both eyes with macular edema associated with type 2 diabetes mellitus (HCC)  E11.3313 OCT, Retina - OU - Both Eyes    Intravitreal Injection, Pharmacologic Agent - OD - Right Eye    aflibercept (EYLEA) SOLN 2 mg    2. Essential hypertension  I10     3. Hypertensive retinopathy of both eyes  H35.033     4. Pseudophakia of both eyes  Z96.1      1. Moderate non-proliferative diabetic retinopathy with edema OU  - moved to [place] from [place], [place] -- history of prior injections OS with Dr. [name]  - records received and reviewed from [facility] (Dr. [name])  - history of focal laser OS x2 and IVK/IVT OU in [place] -- last injection  was IVK OS November 2013  - initial exam: scattered [place], DBH and +macular edema  - initial OCT: diabetic macular edema, both eyes, (OD > OS)  - FA (10.1.19) showed late leaking MA OU -- no NV  - delayed f/u on 4.16.20 due to hospitalization for sepsis / gallstones  - S/P PRP OD (07.14.20) - good laser surrounding, room for posterior fill in if needed  - S/P PRP OS (08.26.20)  - S/P IVA OD #1 (09.17.19), #2 (10.29.19), #3 (11.26.19), #4 (01.02.20), #5 (01.30.20), #6 (02.28.20), #7 (04.16.20), #8 (06.11.20), #9 (08.12.20), #10 (01.11.21)  - S/P IVA OS #1 (10.01.19), #2 (10.29.19), #3 (11.26.19), #4 (01.02.20), #5 (01.30.20), #6 (02.28.20), #7 (04.16.20), #8 (05.14.20), #9 (06.11.20), #10 (07.09.20), #11  (08.12.20), #12 (01.11.21)  - S/P IVK OD #1 (05.14.20) -- no significant improvement  - switched back to Avastin OD (#9 6.11.2020)  - repeated Eylea4U benefits investigation due to possible IVA resistance (8.12.20)  - S/P IVE OS #1 (09.16.20), #2 (10.16.20), #3 (11.13.20), #4 (12.11.20), #5 (02.08.21), #6 (03.12.21), #7 (04.16.21), #8 (05.14.21), #9 (06.18.21), #10 (07.23.21), #11 (8.30.21), #12 (10.05.21), #13 (11.10.21), #14 (12.15.21), #15 (1.18.22), #16 (03.17.22), #17 (11.14.22), #18 (11.14.22), #19 (02.06.23), #20 (05.08.23),   - S/P IVE OD #1 (09.16.20), #2 (10.16.20), #3 (11.13.20), #4 (12.11.20), #5 (02.08.21), #6 (03.12.21), #7 (04.16.21), #8 (05.14.21), #9 (06.18.21), #10 (07.23.21), #11 (08.30.21), #12 (10.05.21), #13 (11.10.21), #14 (12.15.21), #15 (01.18.22), #16 (03.17.22), #17 (5.12.22), #18 (08.02.22), #19 (11.14.22), #20 (11.14.22), #21 (02.06.23), #22 (05.08.23)  - OCT today shows OD: Mild interval increase in central IRF/IRHM; OS: stable resolution of IRF, just trace IRHM remain at 13 wks  - BCVA 20/25 OU -- stable  - recommend IVE OD #23 today, 08.02.23 w/ f/u back to 10 weeks  - recommend holding IVE OS today  - pt in agreement  - Eylea4U benefits investigation completed and verified for 2023  - RBA of procedure discussed, questions answered   - informed consent obtained  - see procedure note  - Eylea informed consent form re-signed and scanned on 05.08.23  - f/u 10 weeks, DFE, OCT, possible injection  2,3. Hypertensive retinopathy OU  - discussed importance of tight BP control  - monitor  4. Pseudophakia OU  - s/p CE/IOL OU in [place], [place]  - beautiful surgeries, doing well  - monitor  Ophthalmic Meds Ordered this visit:  Meds ordered this encounter  Medications   aflibercept (EYLEA) SOLN 2 mg     Return in about 10 weeks (around 07/14/2022) for f/u NPDR OU, DFE, OCT.  There are no Patient Instructions on file for this visit.  This document serves as a  record of services personally performed by Gardiner Sleeper, MD, PhD. It was created on their behalf by Orvan Falconer, an ophthalmic technician. The creation of this record is the provider's dictation and/or activities during the visit.    Electronically signed by: Orvan Falconer, OA, 05/05/22  1:29 PM  This document serves as a record of services personally performed by Gardiner Sleeper, MD, PhD. It was created on their behalf by San Jetty. Owens Shark, OA an ophthalmic technician. The creation of this record is the provider's dictation and/or activities during the visit.    Electronically signed by: San Jetty. Owens Shark, [place] 08.07.2023 1:29 PM  Gardiner Sleeper, M.D., Ph.D. Diseases & Surgery of the Retina and Vitreous [facility]  I have reviewed the above documentation for accuracy and completeness, and I agree with the above.  Gardiner Sleeper, M.D., Ph.D. 05/05/22 1:29 PM   Abbreviations: M myopia (nearsighted); A astigmatism; H hyperopia (farsighted); P presbyopia; Mrx spectacle prescription;  CTL contact lenses; OD right eye; OS left eye; OU both eyes  XT exotropia; ET esotropia; PEK punctate epithelial keratitis; PEE punctate epithelial erosions; DES dry eye syndrome; MGD meibomian gland dysfunction; ATs artificial tears; PFAT's preservative free artificial tears; [place] nuclear sclerotic cataract; PSC posterior subcapsular cataract; ERM epi-retinal membrane; PVD posterior vitreous detachment; RD retinal detachment; DM diabetes mellitus; DR diabetic retinopathy; NPDR non-proliferative diabetic retinopathy; PDR proliferative diabetic retinopathy; CSME clinically significant macular edema; DME diabetic macular edema; dbh dot blot hemorrhages; CWS cotton wool spot; POAG primary open angle glaucoma; C/D cup-to-disc ratio; HVF humphrey visual field; GVF goldmann visual field; OCT optical coherence tomography; IOP intraocular pressure; BRVO Branch retinal vein occlusion; CRVO central  retinal vein occlusion; CRAO central retinal artery occlusion; BRAO branch retinal artery occlusion; RT retinal tear; SB scleral buckle; PPV pars plana vitrectomy; VH Vitreous hemorrhage; PRP panretinal laser photocoagulation; IVK intravitreal kenalog; VMT vitreomacular traction; MH Macular hole;  NVD neovascularization of the disc; NVE neovascularization elsewhere; AREDS age related eye disease study; ARMD age related macular degeneration; POAG primary open angle glaucoma; EBMD epithelial/anterior basement membrane dystrophy; ACIOL anterior chamber intraocular lens; IOL intraocular lens; PCIOL posterior chamber intraocular lens; Phaco/IOL phacoemulsification with intraocular lens placement; [place] photorefractive keratectomy; LASIK laser assisted in situ keratomileusis; HTN hypertension; DM diabetes mellitus; COPD chronic obstructive pulmonary disease

## 2022-05-01 ENCOUNTER — Ambulatory Visit: Payer: Medicare Other | Admitting: Cardiology

## 2022-05-01 ENCOUNTER — Encounter: Payer: Self-pay | Admitting: Cardiology

## 2022-05-01 VITALS — BP 188/88 | HR 65 | Temp 98.5°F | Resp 16 | Ht 67.5 in | Wt 175.2 lb

## 2022-05-01 DIAGNOSIS — I6523 Occlusion and stenosis of bilateral carotid arteries: Secondary | ICD-10-CM

## 2022-05-01 DIAGNOSIS — I2510 Atherosclerotic heart disease of native coronary artery without angina pectoris: Secondary | ICD-10-CM

## 2022-05-01 DIAGNOSIS — I10 Essential (primary) hypertension: Secondary | ICD-10-CM

## 2022-05-01 DIAGNOSIS — I739 Peripheral vascular disease, unspecified: Secondary | ICD-10-CM

## 2022-05-01 DIAGNOSIS — R0609 Other forms of dyspnea: Secondary | ICD-10-CM

## 2022-05-01 DIAGNOSIS — E7800 Pure hypercholesterolemia, unspecified: Secondary | ICD-10-CM

## 2022-05-01 DIAGNOSIS — N401 Enlarged prostate with lower urinary tract symptoms: Secondary | ICD-10-CM

## 2022-05-01 MED ORDER — DULOXETINE HCL 30 MG PO CPEP: 60.0000 mg | ORAL_CAPSULE | Freq: Every day | ORAL | 3 refills | Status: DC

## 2022-05-01 MED ORDER — LOSARTAN POTASSIUM 50 MG PO TABS: 50.0000 mg | ORAL_TABLET | Freq: Two times a day (BID) | ORAL | 0 refills | Status: DC

## 2022-05-01 MED ORDER — TAMSULOSIN HCL 0.4 MG PO CAPS: 0.4000 mg | ORAL_CAPSULE | Freq: Every day | ORAL | 3 refills | Status: DC

## 2022-05-01 MED ORDER — CILOSTAZOL 50 MG PO TABS: 50.0000 mg | ORAL_TABLET | Freq: Two times a day (BID) | ORAL | 3 refills | Status: DC

## 2022-05-01 MED ORDER — ISOSORBIDE DINITRATE 30 MG PO TABS: 30.0000 mg | ORAL_TABLET | Freq: Three times a day (TID) | ORAL | 3 refills | Status: DC

## 2022-05-01 MED ORDER — HYDRALAZINE HCL 100 MG PO TABS: 100.0000 mg | ORAL_TABLET | Freq: Three times a day (TID) | ORAL | 3 refills | Status: DC

## 2022-05-01 MED ORDER — LABETALOL HCL 200 MG PO TABS: 200.0000 mg | ORAL_TABLET | Freq: Two times a day (BID) | ORAL | 3 refills | Status: DC

## 2022-05-01 NOTE — Progress Notes (Signed)
Primary Physician:  [name], MD   Patient ID: Justin [name], male    DOB: 05/20/1933, 88 y.o.   MRN: [id number]  No chief complaint on file.  HPI:    Justin [name]  is a 88 y.o. male with history of CAD, S/P CABG in 2009, small vessel PAD, pseudoclaudication, type II diabetes with autonomic insufficiency and autonomic orthostatic hypotension, stage IIIa chronic kidney disease, primary hypertension, asymptomatic carotid stenosis, and hyperlipidemia.  He has patent graft except occluded SVG to RCA which is diffusely diseased small vessel and also severe below-knee bilateral lower disease by angiography on 11/04/2016 and mild small vessel peripheral arterial disease especially in the left leg angiography in April 2023.  He is presently doing well, still has discomfort in his legs but no rest pain, also has back pain.  He has chronic dizziness and uses meclizine as needed.  No specific complaints today, denies chest pain or dyspnea.  Patient is enrolled in remote patient monitoring of blood pressure.  Past Medical History:  Diagnosis Date   Anxiety    Arthritis    Carotid arterial disease ([place])    CKD (chronic kidney disease)    Coronary artery disease    Diabetes mellitus without complication (HCC)    Diabetic retinopathy ([place])    NPDR OU   Diverticulitis    Dyspnea    GERD (gastroesophageal reflux disease)    History of kidney stones 2021   Hypercholesteremia    Hypertension    Hypertensive retinopathy    OU   Myocardial infarction [facility]) 2009   Pneumonia    as a child   Prostate cancer ([place])    UTI (lower urinary tract infection)    Social History   Tobacco Use   Smoking status: Former    Packs/day: 0.25    Years: 2.00    Total pack years: 0.50    Types: Cigarettes   Smokeless tobacco: Never   Tobacco comments:    when he was a teenage age [age]  Substance Use Topics   Alcohol use: No    Marital Status: Widowed  ROS   Review of Systems   Cardiovascular:  Positive for dyspnea on exertion. Negative for chest pain, claudication and leg swelling.  Musculoskeletal:  Positive for arthritis and back pain.   Objective  There were no vitals taken for this visit. There is no height or weight on file to calculate BMI.     01/28/2022    8:49 AM 01/28/2022    8:34 AM 01/14/2022   12:50 PM  Vitals with BMI  Height  5' 7.5"   Weight  176 lbs   BMI  14.97   Systolic 026 378   Diastolic 82 69   Pulse 59 61 63   Physical Exam Vitals reviewed.  Constitutional:      Appearance: He is well-developed.  Neck:     Vascular: No JVD.  Cardiovascular:     Rate and Rhythm: Normal rate and regular rhythm.     Pulses: Intact distal pulses.          Carotid pulses are  on the right side with bruit and  on the left side with bruit.      Femoral pulses are 2+ on the right side and 2+ on the left side.      Popliteal pulses are 1+ on the right side and 1+ on the left side.       Dorsalis pedis pulses are 0  on the right side and 0 on the left side.       Posterior tibial pulses are 0 on the right side and 0 on the left side.     Heart sounds: Normal heart sounds, S1 normal and S2 normal. No murmur heard.    No gallop.  Pulmonary:     Effort: Pulmonary effort is normal. No accessory muscle usage.     Breath sounds: Normal breath sounds.  Abdominal:     General: Bowel sounds are normal.     Palpations: Abdomen is soft.  Musculoskeletal:     Right lower leg: Edema (trace ankle) present.     Left lower leg: Edema (trace ankle) present.    Laboratory examination:      Latest Ref Rng & Units 01/02/2022   10:18 AM 06/16/2021   12:57 AM 06/14/2021    1:14 PM  CMP  Glucose 70 - 99 mg/dL 140  174  216   BUN 8 - 27 mg/dL 28  29  38   Creatinine 0.76 - 1.27 mg/dL 1.42  1.56  1.91   Sodium 134 - 144 mmol/L 140  137  136   Potassium 3.5 - 5.2 mmol/L 5.0  4.3  4.3   Chloride 96 - 106 mmol/L 109  108  108   CO2 20 - 29 mmol/L _0 Calcium  8.6 - 10.2 mg/dL 9.5  9.1  9.2   Total Protein 6.5 - 8.1 g/dL  6.0  6.7   Total Bilirubin 0.3 - 1.2 mg/dL  0.5  0.4   Alkaline Phos 38 - 126 U/L  50  56   AST 15 - 41 U/L  17  15   ALT 0 - 44 U/L  14  14       Latest Ref Rng & Units 01/02/2022   10:18 AM 06/16/2021   12:57 AM 06/14/2021    1:14 PM  CBC  WBC 3.4 - 10.8 x10E3/uL 6.7  9.1  8.6   Hemoglobin 13.0 - 17.7 g/dL 10.5  11.3  11.0   Hematocrit 37.5 - 51.0 % 32.0  34.0  34.5   Platelets 150 - 450 x10E3/uL 177  187  198    Lipid Panel     Component Value Date/Time   CHOL 130 03/14/2021 0832   TRIG 104 03/14/2021 0832   HDL 49 03/14/2021 0832   LDLCALC 62 03/14/2021 0832   HEMOGLOBIN A1C Lab Results  Component Value Date   HGBA1C 7.2 (H) 11/06/2020   MPG 159.94 11/06/2020   TSH Recent Labs    06/15/21 1611  TSH 1.235     External labs:  01/31/2022: Total cholesterol 143, HDL 59, LDL 70, triglycerides 61  BUN 33, creatinine 1.43, GFR 48, sodium 141, potassium 5.0  Hgb 10.4, HCT 32.5, MCV 93.7, platelet 187  TSH 0.99  BNP 165  Vitamin D 36  Allergies   Allergies  Allergen Reactions   Contrast Media [Iodinated Contrast Media] Itching    PT STATES ALLERGY TO "CT DYE".   Amlodipine Swelling    Leg swelling   Dye Fdc Red [Red Dye] Swelling    CAT scan dye   Ibuprofen Other (See Comments)    Upset stomach      Final Medications at End of Visit     Current Outpatient Medications:    ACCU-CHEK SMARTVIEW test strip, , Disp: , Rfl:    Accu-Chek Softclix Lancets lancets, , Disp: , Rfl:  acetaminophen (TYLENOL) 500 MG tablet, Take 1,000 mg by mouth 2 (two) times daily as needed for mild pain., Disp: , Rfl:    Blood Glucose Monitoring Suppl (ACCU-CHEK GUIDE) w/Device KIT, , Disp: , Rfl:    cholecalciferol (VITAMIN D3) 25 MCG (1000 UNIT) tablet, Take 1,000 Units by mouth daily., Disp: , Rfl:    clonazePAM (KLONOPIN) 0.5 MG tablet, Take 0.5 mg by mouth daily as needed for anxiety., Disp: , Rfl:    docusate  sodium (COLACE) 50 MG capsule, Take 50 mg by mouth 2 (two) times daily., Disp: , Rfl:    DULoxetine (CYMBALTA) 30 MG capsule, Take 60 mg by mouth daily., Disp: , Rfl:    ezetimibe (ZETIA) 10 MG tablet, TAKE 1 TABLET DAILY AFTER SUPPER (Patient taking differently: Take 10 mg by mouth every evening.), Disp: 90 tablet, Rfl: 3   hydrALAZINE (APRESOLINE) 50 MG tablet, TAKE 1 TABLET THREE TIMES DAILY (Patient taking differently: Take 100 mg by mouth 3 (three) times daily.), Disp: 270 tablet, Rfl: 3   isosorbide mononitrate (IMDUR) 30 MG 24 hr tablet, 1 tab(s) orally 2 times a day, Disp: , Rfl:    labetalol (NORMODYNE) 100 MG tablet, Take 2 tablets (200 mg total) by mouth 2 (two) times daily., Disp: 180 tablet, Rfl: 3   LANTUS SOLOSTAR 100 UNIT/ML Solostar Pen, Inject 10 Units into the skin 2 (two) times daily. Inject 10 units in the morning and 10 units in the evening., Disp: , Rfl:    linaclotide (LINZESS) 145 MCG CAPS capsule, Take 145 mcg by mouth daily as needed (constipation)., Disp: , Rfl:    losartan (COZAAR) 50 MG tablet, Take 1 tablet (50 mg total) by mouth in the morning and at bedtime., Disp: 20 tablet, Rfl: 0   meclizine (ANTIVERT) 25 MG tablet, Take 1 tablet (25 mg total) by mouth 3 (three) times daily as needed for dizziness., Disp: 30 tablet, Rfl: 0   omeprazole (PRILOSEC) 40 MG capsule, Take 40 mg by mouth daily. , Disp: , Rfl:    Pyridoxine HCl (B-6 PO), Take 1 capsule by mouth daily., Disp: , Rfl:    simvastatin (ZOCOR) 20 MG tablet, Take 20 mg by mouth at bedtime., Disp: , Rfl:    tamsulosin (FLOMAX) 0.4 MG CAPS capsule, Take 0.4 mg by mouth daily., Disp: , Rfl:    Turmeric (QC TUMERIC COMPLEX PO), Take 1 capsule by mouth daily., Disp: , Rfl:    vitamin B-12 (CYANOCOBALAMIN) 500 MCG tablet, Take 500 mcg by mouth daily., Disp: , Rfl:    Vitamin E 268 MG (400 UNIT) CAPS, Take 400 Units by mouth daily., Disp: , Rfl:    XARELTO 2.5 MG TABS tablet, TAKE 1 TABLET TWICE DAILY (Patient taking  differently: Take 2.5 mg by mouth 2 (two) times daily.), Disp: 180 tablet, Rfl: 3  Radiology:   No results found.  Cardiac Studies:   Coronary angiogram 11/04/2016: Native RCA small and diffusely diseased with distal 70-80% stenosis. Occluded SVG to RCA. Patent SVG to OM-RI, LIMA to LAD. Medical therapy.  Lower Extremity Arterial Duplex 11/30/2019:  No hemodynamically significant stenosis in bilateral lower extremity above  the knee.  Moderate velocity increase at the right mid posterior tibial artery.  suggests >50%stenosis.  Diffuse small vessel disease with moderately abnormal waveform right ankle  and severely abnormal waveform left ankle.  Non compressible vessels bilateral at the ankles suggests medial  calcinosis.    No significant change from 05/02/2019.  PCV MYOCARDIAL PERFUSION WITH LEXISCAN 09/24/2020  Lexiscan nuclear stress test performed using 1-day protocol. SPECT images showed small sized, mild intensity, mildly reversible perfusion defect in apical to basal inferior myocardium. Stress LVEF 53%. Low risk study.  ABI 02/11/2021: Right PT monophasic and non-compressible, DP biphasic and noncompressible.  TBI 0.59.  Right great toe pressure 102 mmHg. Left PT monophasic and noncompressible, DP monophasic and noncompressible.  TBI 0.42.  Left great toe pressure 73 mmHg. Bilateral TBI abnormal. Bilateral TBI suggest appropriate pressure for wound healing.   Patient has had peripheral arteriogram on 01/03/2020 revealing widely patent large vessels and small vessel disease.  In the absence of open wounds or nonhealing foot ulcer and absence of rest pain, recommend medical therapy  Echocardiogram 06/15/2021: 1. Left ventricular ejection fraction, by estimation, is 60 to 65%. The  left ventricle has normal function. The left ventricle has no regional  wall motion abnormalities. There is severe left ventricular hypertrophy.  Left ventricular diastolic parameters  are consistent with  Grade I diastolic dysfunction (impaired relaxation).   2. Right ventricular systolic function is normal. The right ventricular size is normal.   3. Left atrial size was mildly dilated.   4. The mitral valve is abnormal. Mild to moderate mitral valve regurgitation.   5. The aortic valve is tricuspid. Mild aortic valve sclerosis is present, with no evidence of  aortic valve stenosis.   6. The inferior vena cava is normal in size with greater than 50%  respiratory variability, suggesting right atrial pressure of 3 mmHg.   Comparison(s): No significant change from prior study. 01/11/2021: LVEF  60-65%, moderate LVH, grade 1 DD, moderate MR.   Carotid artery duplex 12/24/2021:  Duplex suggests stenosis in the right internal carotid artery (50-69%),  upper limit of the spectrum.  Duplex suggests stenosis in the left internal carotid artery (1-15%).  Duplex suggests stenosis in the left external carotid artery (<50%).  Antegrade right vertebral artery flow. Antegrade left vertebral artery flow.  Compared to 05/14/2021, mild regression of disease severity in the right  carotid artery from > 70%. Follow up in six months is appropriate if clinically indicated.  Peripheral arteriogram 01/14/2022: Abdominal aortogram reveals presence of 2 renal arteries 1 on either sides.  Widely patent.  Mild disease is evident.  Abdominal aorta itself does not reveal any abdominal aortic aneurysm or stenosis.  Aortoiliac bifurcation is widely patent.  Femoral arteries bilaterally were widely patent all the way up to the above-knee and below-knee.  No significant disease was evident.  There is three-vessel runoff noted below the bilateral knee, there is mild disease in the small vessels.  The small vessels were not well visualized due to use of CO2 only however patency of the AT, PT and peroneal vessels documented.  Mild disease to moderate disease in the left peroneal and anterior tibial vessel was  evident.  Recommendation: Patient symptoms is pseudoclaudication not related to peripheral arterial disease.  Previously noted bilateral anterior tibial vessel occlusion is now widely patent, very mild disease is evident below the knee.  Medical therapy.  Evaluation for noncardiac causes of lower extremity weakness and pain indicated.  20 mL contrast utilized.  EKG   01/02/2022: Sinus rhythm at a rate of 65 bpm.  First-degree AV block.  PACs.  Left axis.  Right bundle branch block with secondary ST-T wave changes.  Compared EKG 04/02/2021, no significant change.  EKG 10/02/2020: Sinus bradycardia at a rate of 54 bpm with borderline first-degree AV block. Left axis deviation, left anterior fascicular block.  Right bundle branch block.  Nonspecific T wave abnormality.  Compared to EKG 11/14/2019, bradycardia new.  EKG 11/14/2019: Normal sinus rhythm at 74 bpm with 1 PAC, left axis deviation, left anterior fasicular block, RBBB. Nonspecific T wave abnormality.  Assessment:     ICD-10-CM   1. Atherosclerosis of native coronary artery of native heart without angina pectoris  I25.10     2. Primary hypertension  I10     3. DOE (dyspnea on exertion)  R06.09     4. Hypercholesteremia  E78.00     5. Asymptomatic bilateral carotid artery stenosis  I65.23       No orders of the defined types were placed in this encounter.  There are no discontinued medications.  Recommendations:   Justin [name]  is a 88 y.o. male with history of CAD, S/P CABG in 2009, small vessel PAD, pseudoclaudication, type II diabetes with autonomic insufficiency and autonomic orthostatic hypotension, stage IIIa chronic kidney disease, primary hypertension, asymptomatic carotid stenosis, and hyperlipidemia.  He has patent graft except occluded SVG to RCA which is diffusely diseased small vessel and also severe below-knee bilateral lower disease by angiography on 11/04/2016 and mild small vessel peripheral arterial disease especially  in the left leg angiography in April 2023.  His symptoms of claudication are very mild, he would like to come off of Xarelto which was started for CLI in the past.  I will switch him to Pletal 50 mg p.o. twice daily.  With regard to hypertension, continues to have elevated blood pressure.  We will discontinue isosorbide mononitrate and switch him to isosorbide dinitrate 30 mg 3 times daily.  I refilled his prescriptions.  Reviewed his labs.  I would like to see him back in 3 months specifically for follow-up of hypertension and any symptoms of claudication.  With regard to carotid artery disease, he is presently 88 years of age, no further evaluation is indicated.  Otherwise remained stable, from a cardiac standpoint he has not had any recurrence of bilateral platelets, I will see him back in 3 months.  Patient is enrolled in remote patient monitoring of blood pressure, will continue this. Enrolled in [facility] July 2023 summary Average Systolic BP Level 616.83 mmHg Lowest Systolic BP Level 729 mmHg Highest Systolic BP Level 021 mmHg   Justin Prows, MD, [facility] 05/01/2022, 7:08 AM Office: [phone] Fax: [phone] Pager: [phone]

## 2022-05-04 ENCOUNTER — Other Ambulatory Visit: Payer: Self-pay | Admitting: Cardiology

## 2022-05-04 DIAGNOSIS — I10 Essential (primary) hypertension: Secondary | ICD-10-CM

## 2022-05-05 ENCOUNTER — Encounter (INDEPENDENT_AMBULATORY_CARE_PROVIDER_SITE_OTHER): Payer: Self-pay | Admitting: Ophthalmology

## 2022-05-05 ENCOUNTER — Ambulatory Visit (INDEPENDENT_AMBULATORY_CARE_PROVIDER_SITE_OTHER): Payer: Medicare Other | Admitting: Ophthalmology

## 2022-05-05 DIAGNOSIS — H35033 Hypertensive retinopathy, bilateral: Secondary | ICD-10-CM

## 2022-05-05 DIAGNOSIS — I10 Essential (primary) hypertension: Secondary | ICD-10-CM | POA: Diagnosis not present

## 2022-05-05 DIAGNOSIS — Z961 Presence of intraocular lens: Secondary | ICD-10-CM

## 2022-05-05 DIAGNOSIS — E113313 Type 2 diabetes mellitus with moderate nonproliferative diabetic retinopathy with macular edema, bilateral: Secondary | ICD-10-CM

## 2022-05-05 MED ORDER — AFLIBERCEPT 2MG/0.05ML IZ SOLN FOR KALEIDOSCOPE
2.0000 mg | INTRAVITREAL | Status: AC | PRN
  Administered 2022-05-05: 2 mg via INTRAVITREAL

## 2022-05-19 ENCOUNTER — Other Ambulatory Visit: Payer: Self-pay

## 2022-05-19 DIAGNOSIS — I10 Essential (primary) hypertension: Secondary | ICD-10-CM

## 2022-05-19 MED ORDER — LOSARTAN POTASSIUM 50 MG PO TABS: 50.0000 mg | ORAL_TABLET | Freq: Every day | ORAL | 3 refills | Status: DC

## 2022-05-19 NOTE — Telephone Encounter (Signed)
Patient brought his medication bottle of Losartan, requesting a refill. Last filled 20 tablets with no refills. Patient does not have an appointment until November, is it okto refill until then? Please advise.

## 2022-06-03 ENCOUNTER — Telehealth: Payer: Self-pay

## 2022-06-03 NOTE — Telephone Encounter (Signed)
Called and spoke to patient and she voiced understanding

## 2022-06-03 NOTE — Telephone Encounter (Signed)
Can stop. Okay to retry after a week

## 2022-06-05 ENCOUNTER — Encounter: Payer: Self-pay | Admitting: Physical Therapy

## 2022-06-05 ENCOUNTER — Ambulatory Visit: Payer: Medicare Other | Attending: Family Medicine | Admitting: Physical Therapy

## 2022-06-05 DIAGNOSIS — R2681 Unsteadiness on feet: Secondary | ICD-10-CM | POA: Insufficient documentation

## 2022-06-05 DIAGNOSIS — R2689 Other abnormalities of gait and mobility: Secondary | ICD-10-CM | POA: Diagnosis present

## 2022-06-05 DIAGNOSIS — R42 Dizziness and giddiness: Secondary | ICD-10-CM | POA: Diagnosis present

## 2022-06-05 NOTE — Therapy (Signed)
OUTPATIENT PHYSICAL THERAPY VESTIBULAR EVALUATION     Patient Name: Justin [name] MRN: [id number] DOB:12/11/1932, 88 y.o., male Today's Date: 06/05/2022  PCP: Willene Hatchet, NP REFERRING PROVIDER: [phone] (ICD-10-CM):  Loura Pardon, MD    PT End of Session - 06/05/22 1839     Visit Number 1    Number of Visits 13    Date for PT Re-Evaluation 07/18/22    Authorization Type [employer] Medicare    Authorization Time Period 06-05-22 - 07-29-22    PT Start Time 1102    PT Stop Time 1150    PT Time Calculation (min) 48 min    Activity Tolerance Patient tolerated treatment well    Behavior During Therapy WFL for tasks assessed/performed             Past Medical History:  Diagnosis Date   Anxiety    Arthritis    Carotid arterial disease ([place])    CKD (chronic kidney disease)    Coronary artery disease    Diabetes mellitus without complication ([place])    Diabetic retinopathy ([place])    NPDR OU   Diverticulitis    Dyspnea    GERD (gastroesophageal reflux disease)    History of kidney stones 2021   Hypercholesteremia    Hypertension    Hypertensive retinopathy    OU   Myocardial infarction [facility]) 2009   Pneumonia    as a child   Prostate cancer ([place])    UTI (lower urinary tract infection)    Past Surgical History:  Procedure Laterality Date   ABDOMINAL SURGERY     for diverticulitis; also removed appendix   APPENDECTOMY     CARDIAC CATHETERIZATION  2018   CATARACT EXTRACTION     CATARACT EXTRACTION, BILATERAL     CHOLECYSTECTOMY N/A 10/12/2017   Procedure: LAPAROSCOPIC CHOLECYSTECTOMY;  Surgeon: [name], MD;  Location: [place];  Service: General;  Laterality: N/A;   CORONARY ARTERY BYPASS GRAFT  2009   ERCP N/A 12/21/2018   Procedure: ENDOSCOPIC RETROGRADE CHOLANGIOPANCREATOGRAPHY (ERCP);  Surgeon: [name], MD;  Location: [place];  Service: Endoscopy;  Laterality: N/A;   ESOPHAGOGASTRODUODENOSCOPY (EGD) WITH PROPOFOL N/A 12/17/2018   Procedure:  ESOPHAGOGASTRODUODENOSCOPY (EGD) WITH PROPOFOL;  Surgeon: [name], MD;  Location: [place];  Service: Endoscopy;  Laterality: N/A;   EUS Left 12/17/2018   Procedure: UPPER ENDOSCOPIC ULTRASOUND (EUS) LINEAR;  Surgeon: [name], MD;  Location: [place];  Service: Endoscopy;  Laterality: Left;   EYE SURGERY     "for bleeding in eye"   HERNIA REPAIR     LEFT HEART CATH AND CORONARY ANGIOGRAPHY N/A 11/04/2016   Procedure: Left Heart Cath and Coronary Angiography;  Surgeon: [name], MD;  Location: [place] [facility];  Service: Cardiovascular;  Laterality: N/A;   LOWER EXTREMITY ANGIOGRAPHY N/A 11/04/2016   Procedure: Lower Extremity Angiography;  Surgeon: [name], MD;  Location: [place] [facility];  Service: Cardiovascular;  Laterality: N/A;   LOWER EXTREMITY ANGIOGRAPHY N/A 01/03/2020   Procedure: LOWER EXTREMITY ANGIOGRAPHY;  Surgeon: [name], MD;  Location: [place] [facility];  Service: Cardiovascular;  Laterality: N/A;   LOWER EXTREMITY ANGIOGRAPHY Bilateral 01/14/2022   Procedure: Lower Extremity Angiography;  Surgeon: [name], MD;  Location: [place] [facility];  Service: Cardiovascular;  Laterality: Bilateral;   LUMBAR LAMINECTOMY/DECOMPRESSION MICRODISCECTOMY Bilateral 11/09/2020   Procedure: Laminectomy and Foraminotomy - bilateral - Lumbar Four-Five.;  Surgeon: Earnie Larsson, MD;  Location: [place];  Service: Neurosurgery;  Laterality: Bilateral;  posterior   PROSTATE SURGERY     REMOVAL OF STONES  12/21/2018   Procedure: REMOVAL OF STONES;  Surgeon: [name], MD;  Location: [facility] ENDOSCOPY;  Service: Endoscopy;;   SPHINCTEROTOMY  12/21/2018   Procedure: [name];  Surgeon: [name], MD;  Location: [place];  Service: Endoscopy;;   Patient Active Problem List   Diagnosis Date Noted   Near syncope 06/15/2021   Dizziness 06/15/2021   Lumbar stenosis with neurogenic claudication 11/09/2020   CKD stage 3 due to type 2 diabetes mellitus ([place]) 12/14/2019    Asymptomatic bilateral carotid artery stenosis 12/14/2019   Abdominal distention    CAD (coronary artery disease) 12/14/2018   Acute renal failure superimposed on stage 3 chronic kidney disease ([place]) 12/14/2018   UTI (urinary tract infection) 12/14/2018   HLD (hyperlipidemia) 12/14/2018   Sepsis ([place]) 12/14/2018   Abnormal LFTs    S/P CABG (coronary artery bypass graft) 12/06/2018   Asymptomatic stenosis of right carotid artery 12/06/2018   Claudication in peripheral vascular disease ([place]) 12/06/2018   Orthostatic hypotension 12/06/2018   Elevated liver enzymes 12/06/2018   Abdominal pain 10/12/2018   Abnormal liver function tests 10/12/2018   Foreign body in stomach 10/12/2018   Primary hypertension    GERD (gastroesophageal reflux disease)    Diabetes mellitus without complication ([place])    Atherosclerosis of native coronary artery of native heart without angina pectoris     ONSET DATE: approx. A year ago for onset of symptoms:  Referral date 05-26-22  REFERRING DIAG: R42 (ICD-10-CM) - Dizziness and giddiness   THERAPY DIAG:  Dizziness and giddiness - Plan: PT plan of care cert/re-cert  Other abnormalities of gait and mobility - Plan: PT plan of care cert/re-cert  Unsteadiness on feet - Plan: PT plan of care cert/re-cert  Rationale for Evaluation and Treatment Rehabilitation  SUBJECTIVE:   SUBJECTIVE STATEMENT: Pt states he is dizzy "24/7" - has been to ENT (last year) - states they didn't find anything wrong:  states his ears ring all the time, ears get clogged up a lot - states he swallows and opens his mouth and that opens his ears back up; states dizziness started a little over a year ago   Pt accompanied by: family member - daughter  PERTINENT HISTORY: CKD stage 3 due to DM type 2:  h/o MI 2009: lumbar stenosis:  orthostatic hypotension:  HTN, h/o near syncope, carotid arterial disease, peripheral neuropathy, diabetic retinopathy   PAIN:  Are you having pain? Yes:  NPRS scale: 5-6/10 Pain location: feet and legs Pain description: burning, tingling, tightness as if a compressive force is on RLE (due to edema) Aggravating factors: unknown Relieving factors: unknown  PRECAUTIONS: None  WEIGHT BEARING RESTRICTIONS No  FALLS: Has patient fallen in last 6 months? Yes. Number of falls 1  LIVING ENVIRONMENT: Lives with: lives with their family Lives in: House/apartment Has following equipment at home: Single point cane and Environmental consultant - 2 wheeled  PLOF: Independent with basic ADLs and Independent with household mobility with device  PATIENT GOALS "stop being dizzy":   OBJECTIVE:   DIAGNOSTIC FINDINGS:  MRI in March 2023-- Mild-to-moderate chronic small vessel ischemic changes within the cerebral white matter, stable from the prior MRI of 06/14/2021.   Mild-to-moderate generalized cerebral atrophy.   Comparatively mild cerebellar atrophy.  COGNITION: Overall cognitive status: Within functional limits for tasks assessed   SENSATION: Light touch: Impaired ; LLE more swollen than RLE ; states feet feel swollen  POSTURE: rounded shoulders  and forward head   Cervical ROM:  WNL's   STRENGTH: WFL's   BED MOBILITY: Independent  TRANSFERS: Assistive device utilized: Single point cane  Sit to stand: Modified independence Stand to sit: Modified independence  GAIT: Gait pattern: step through pattern Distance walked: 100' - slow speed Assistive device utilized: Single point cane Level of assistance: SBA  FUNCTIONAL TESTs:  Timed up and go (TUG): 27.25 secs with SPC  PATIENT SURVEYS:  FOTO DPS 45/100;  risk adjusted 46/100;  DFS score 35.5   VESTIBULAR ASSESSMENT   GENERAL OBSERVATION: Pt is an 88 yr old gentleman amb. With use of SPC - reports dizziness "24/7" which he states is worse at times (varies in intensity)     SYMPTOM BEHAVIOR:   Subjective history: Pt states he has fallen from kitchen table because he felt as if table was  moving    Non-Vestibular symptoms: changes in hearing and tinnitus   Type of dizziness: Imbalance (Disequilibrium), Spinning/Vertigo, Unsteady with head/body turns, Lightheadedness/Faint, and "World moves"   Frequency: daily - "24/7"; gets worse later in the evening   Duration: lasts most of the day   Aggravating factors: Induced by motion: activity in general, Worse with fatigue, Worse outside or in busy environment, and Occurs when standing still    Relieving factors: lying supine and rest   Progression of symptoms: worse   OCULOMOTOR EXAM:   Ocular Alignment: normal   Ocular ROM: No Limitations   Spontaneous Nystagmus: absent   Gaze-Induced Nystagmus: absent   Smooth Pursuits: intact - pt reported dizziness both with horizontal and vertical smooth pursuit testing   Saccades: intact     VESTIBULAR - OCULAR REFLEX:      Dynamic Visual Acuity: Static: line 8 Dynamic: line 5    POSITIONAL TESTING: Right Sidelying: no nystagmus Left Sidelying: no nystagmus        VESTIBULAR TREATMENT:  Gaze Adaptation: instructed in x1 viewing in seated position; demonstrated ex. Only due to time constraint:  instructed pt to perform horizontal & vertical positions     PATIENT EDUCATION: Education details: eval results and x1 viewing exercise in seated position Person educated: Patient and daughter Education method: Explanation and Handouts and demonstration Education comprehension: verbalized understanding   GOALS: Goals reviewed with patient? Yes  SHORT TERM GOALS: Target date: 06/26/2022 (3 weeks)   Independent in HEP for balance and vestibular exercises. Baseline: Goal status: INITIAL  2.  Participate in [place] balance testing.  Baseline:  Goal status: INITIAL  3.  Improve TUG score to </= 23 secs with SPC with SBA. Baseline: 27.25 secs with SPC Goal status: INITIAL  4.  Pt will subjectively report at least 25% improvement in dizziness Baseline:  Goal status:  INITIAL   LONG TERM GOALS: Target date: 07/17/2022  (6 weeks)  Improve Berg balance test score by at least 4 points to reduce fall risk. Baseline:  Goal status: INITIAL  2.  Improve FOTO score DPS from 45/100 to at least 53/100 to demo improvement in dizziness. Baseline: DPS 45/100;  DFS 35.5 Goal status: INITIAL  3.  Improve DVA to </= 2 line difference for improved gaze stabilization. Baseline:  Goal status: INITIAL  4.  Amb. 115' with SPC with intermittent horizontal head turns for environmental scanning without LOB, with SBA.  Baseline:  Goal status: INITIAL  5.  Independent in updated HEP for balance and vestibular exercises.  Baseline:  Goal status: INITIAL   ASSESSMENT:  CLINICAL IMPRESSION: Patient is a 88 y.o.  gentleman who was seen today for physical therapy evaluation and treatment for dizziness.  Dizziness appears to be multi-factorial in etiology with mixed peripheral and central etiologies and with peripheral neuropathy impacting balance.  Pt's DVA is a 3 line difference, indicative of abnormal VOR as > 2 line difference.  Pt is at fall risk with TUG score 27.25 secs with use of SPC.  Pt will benefit from PT to address balance and gait deficits and dizziness/dysequilibrium.      OBJECTIVE IMPAIRMENTS decreased activity tolerance, decreased balance, difficulty walking, dizziness, impaired vision/preception, and pain.   ACTIVITY LIMITATIONS carrying, lifting, bending, standing, squatting, stairs, and locomotion level  PARTICIPATION LIMITATIONS: meal prep, cleaning, laundry, driving, shopping, and community activity  PERSONAL FACTORS Age, Time since onset of injury/illness/exacerbation, and 1-2 comorbidities: orthostatic hypotension and neuropathy  are also affecting patient's functional outcome.   REHAB POTENTIAL: Fair due to chronicity of deficits  CLINICAL DECISION MAKING: Evolving/moderate complexity  EVALUATION COMPLEXITY: Moderate   PLAN: PT FREQUENCY:  2x/week  PT DURATION: 6 weeks  PLANNED INTERVENTIONS: Therapeutic exercises, Therapeutic activity, Neuromuscular re-education, Balance training, Gait training, Patient/Family education, Self Care, Stair training, and Vestibular training  PLAN FOR NEXT SESSION: do Berg test; instruct in standing balance exercises at counter - kicks, marching, sit to stand; gait with head turns as able   Lavontay Kirk, [place], PT 06/05/2022, 7:36 PM

## 2022-06-05 NOTE — Patient Instructions (Signed)
  Gaze Stabilization: Tip Card  1.Target must remain in focus, not blurry, and appear stationary while head is in motion. 2.Perform exercises with small head movements (45 to either side of midline). 3.Increase speed of head motion so long as target is in focus. 4.If you wear eyeglasses, be sure you can see target through lens (therapist will give specific instructions for bifocal / progressive lenses). 5.These exercises may provoke dizziness or nausea. Work through these symptoms. If too dizzy, slow head movement slightly. Rest between each exercise. 6.Exercises demand concentration; avoid distractions. 7.For safety, perform standing exercises close to a counter, wall, corner, or next to someone.     Gaze Stabilization: Sitting    Keeping eyes on target on wall' \\_10N'$  feet away, tilt head down 15-30 and move head side to side for __60__ seconds. Repeat while moving head up and down for _60___ seconds. Do _3___ sessions per day.

## 2022-06-24 ENCOUNTER — Encounter: Payer: Self-pay | Admitting: Physical Therapy

## 2022-06-24 ENCOUNTER — Ambulatory Visit: Payer: Medicare Other | Admitting: Physical Therapy

## 2022-06-24 DIAGNOSIS — R42 Dizziness and giddiness: Secondary | ICD-10-CM | POA: Diagnosis not present

## 2022-06-24 DIAGNOSIS — R2681 Unsteadiness on feet: Secondary | ICD-10-CM

## 2022-06-24 NOTE — Therapy (Signed)
OUTPATIENT PHYSICAL THERAPY VESTIBULAR EVALUATION     Patient Name: Justin [name] MRN: [id number] DOB:[birth year], 88 y.o., male Today's Date: 06/24/2022  PCP: Willene Hatchet, NP REFERRING PROVIDER: [phone] (ICD-10-CM):  Loura Pardon, MD    PT End of Session - 06/24/22 2123     Visit Number 2    Number of Visits 13    Date for PT Re-Evaluation 07/18/22    Authorization Type [employer] Medicare    Authorization Time Period 06-05-22 - 07-29-22    PT Start Time 0800    PT Stop Time 0846    PT Time Calculation (min) 46 min    Activity Tolerance Patient tolerated treatment well    Behavior During Therapy [facility] for tasks assessed/performed              Past Medical History:  Diagnosis Date   Anxiety    Arthritis    Carotid arterial disease ([place])    CKD (chronic kidney disease)    Coronary artery disease    Diabetes mellitus without complication ([place])    Diabetic retinopathy ([place])    NPDR OU   Diverticulitis    Dyspnea    GERD (gastroesophageal reflux disease)    History of kidney stones 2021   Hypercholesteremia    Hypertension    Hypertensive retinopathy    OU   Myocardial infarction [facility]) 2009   Pneumonia    as a child   Prostate cancer ([place])    UTI (lower urinary tract infection)    Past Surgical History:  Procedure Laterality Date   ABDOMINAL SURGERY     for diverticulitis; also removed appendix   APPENDECTOMY     CARDIAC CATHETERIZATION  2018   CATARACT EXTRACTION     CATARACT EXTRACTION, BILATERAL     CHOLECYSTECTOMY N/A 10/12/2017   Procedure: LAPAROSCOPIC CHOLECYSTECTOMY;  Surgeon: [name], MD;  Location: [place];  Service: General;  Laterality: N/A;   CORONARY ARTERY BYPASS GRAFT  2009   ERCP N/A 12/21/2018   Procedure: ENDOSCOPIC RETROGRADE CHOLANGIOPANCREATOGRAPHY (ERCP);  Surgeon: [name], MD;  Location: [place];  Service: Endoscopy;  Laterality: N/A;   ESOPHAGOGASTRODUODENOSCOPY (EGD) WITH PROPOFOL N/A 12/17/2018   Procedure:  ESOPHAGOGASTRODUODENOSCOPY (EGD) WITH PROPOFOL;  Surgeon: [name], MD;  Location: [place];  Service: Endoscopy;  Laterality: N/A;   EUS Left 12/17/2018   Procedure: UPPER ENDOSCOPIC ULTRASOUND (EUS) LINEAR;  Surgeon: [name], MD;  Location: [place];  Service: Endoscopy;  Laterality: Left;   EYE SURGERY     "for bleeding in eye"   HERNIA REPAIR     LEFT HEART CATH AND CORONARY ANGIOGRAPHY N/A 11/04/2016   Procedure: Left Heart Cath and Coronary Angiography;  Surgeon: [name], MD;  Location: [place] [facility];  Service: Cardiovascular;  Laterality: N/A;   LOWER EXTREMITY ANGIOGRAPHY N/A 11/04/2016   Procedure: Lower Extremity Angiography;  Surgeon: [name], MD;  Location: [place] [facility];  Service: Cardiovascular;  Laterality: N/A;   LOWER EXTREMITY ANGIOGRAPHY N/A 01/03/2020   Procedure: LOWER EXTREMITY ANGIOGRAPHY;  Surgeon: [name], MD;  Location: [place] [facility];  Service: Cardiovascular;  Laterality: N/A;   LOWER EXTREMITY ANGIOGRAPHY Bilateral 01/14/2022   Procedure: Lower Extremity Angiography;  Surgeon: [name], MD;  Location: [place] [facility];  Service: Cardiovascular;  Laterality: Bilateral;   LUMBAR LAMINECTOMY/DECOMPRESSION MICRODISCECTOMY Bilateral 11/09/2020   Procedure: Laminectomy and Foraminotomy - bilateral - Lumbar Four-Five.;  Surgeon: Earnie Larsson, MD;  Location: [place];  Service: Neurosurgery;  Laterality:  Bilateral;  posterior   PROSTATE SURGERY     REMOVAL OF STONES  12/21/2018   Procedure: REMOVAL OF STONES;  Surgeon: [name], MD;  Location: [facility] ENDOSCOPY;  Service: Endoscopy;;   SPHINCTEROTOMY  12/21/2018   Procedure: [name];  Surgeon: [name], MD;  Location: [place];  Service: Endoscopy;;   Patient Active Problem List   Diagnosis Date Noted   Near syncope 06/15/2021   Dizziness 06/15/2021   Lumbar stenosis with neurogenic claudication 11/09/2020   CKD stage 3 due to type 2 diabetes mellitus ([place]) 12/14/2019    Asymptomatic bilateral carotid artery stenosis 12/14/2019   Abdominal distention    CAD (coronary artery disease) 12/14/2018   Acute renal failure superimposed on stage 3 chronic kidney disease ([place]) 12/14/2018   UTI (urinary tract infection) 12/14/2018   HLD (hyperlipidemia) 12/14/2018   Sepsis ([place]) 12/14/2018   Abnormal LFTs    S/P CABG (coronary artery bypass graft) 12/06/2018   Asymptomatic stenosis of right carotid artery 12/06/2018   Claudication in peripheral vascular disease ([place]) 12/06/2018   Orthostatic hypotension 12/06/2018   Elevated liver enzymes 12/06/2018   Abdominal pain 10/12/2018   Abnormal liver function tests 10/12/2018   Foreign body in stomach 10/12/2018   Primary hypertension    GERD (gastroesophageal reflux disease)    Diabetes mellitus without complication ([place])    Atherosclerosis of native coronary artery of native heart without angina pectoris     ONSET DATE: approx. A year ago for onset of symptoms:  Referral date 05-26-22  REFERRING DIAG: R42 (ICD-10-CM) - Dizziness and giddiness   THERAPY DIAG:  Unsteadiness on feet  Dizziness and giddiness  Rationale for Evaluation and Treatment Rehabilitation  SUBJECTIVE:   SUBJECTIVE STATEMENT: Pt reports no changes since initial eval 3 weeks ago; continues to c/o dysequilibrium/ "dizziness" due to the neuropathy - (pt does not have true vertigo); states he has been doing the letter exercise in seated position   Pt accompanied by: family member - daughter  PERTINENT HISTORY: CKD stage 3 due to DM type 2:  h/o MI 2009: lumbar stenosis:  orthostatic hypotension:  HTN, h/o near syncope, carotid arterial disease, peripheral neuropathy, diabetic retinopathy   PAIN:  Are you having pain? Yes: NPRS scale: 5-6/10 Pain location: feet and legs Pain description: burning, tingling, tightness as if a compressive force is on RLE (due to edema) Aggravating factors: unknown Relieving factors: unknown  PRECAUTIONS:  None  WEIGHT BEARING RESTRICTIONS No  FALLS: Has patient fallen in last 6 months? Yes. Number of falls 1  LIVING ENVIRONMENT: Lives with: lives with their family Lives in: House/apartment Has following equipment at home: Single point cane and Environmental consultant - 2 wheeled  PLOF: Independent with basic ADLs and Independent with household mobility with device  PATIENT GOALS "stop being dizzy":   OBJECTIVE:     GAIT: Gait pattern: step through pattern Distance walked: 100' - slow speed Assistive device utilized: Single point cane; pt amb. Short clinic distances (from mat table to counter without SPC) Level of assistance: supervision    THEREX:   Sit to stand 10 reps from high/low mat table without UE support Bil. Heel raises 10 reps with UE support on counter  Hamstring stretching - runner's stretch at counter 20 sec hold 1 rep RLE and LLE Heel cord stretch - used bottom cabinet shelf - 20 sec hold 1 rep each leg separately       NEURO TREATMENT:  Balance exercises- issued for HEP:  Forward, back and side  kicks 10 reps alternating legs with UE support prn on counter Marching in place 10 reps  Marching forward 2 reps along counter with SBA with 1 UE support on counter Standing with feet together EO 30 sec hold with intermittent UE support on counter; with head turns side to side slowly 5 reps, vertical head turns 5 reps slowly with SBA with increased UE support Crossovers front 2 reps along counter with consistent UE support on counter; stepping behind with each leg - 2 reps along counter with UE support with CGA to SBA SLS on each leg - 10 sec hold with UE support     PATIENT EDUCATION: Education details:  HEP for balance, strengthening LE's and stretching hamstrings/heel cords Person educated: Patient and daughter Education method: Explanation and Handouts and demonstration Education comprehension: verbalized understanding  HEP:  see below  Heel Raise: Bilateral (Standing)  - HOLD ONTO COUNTER FOR SAFETY    Rise on balls of feet. Repeat _10___ times per set. Do _1___ sets per session. Do _1___ sessions per day.     Gastroc / Heel Cord Stretch - On Step - can use block or book approx. 3" high or bottom shelf of cabinet     Stand with heels over edge of stair. Holding rail, lower heels until stretch is felt in calf of legs. Repeat _1-2__ times. Do _2__ times per day.      Calf / Gastoc: Runners' Stretch I    One leg back and straight, other forward and bent supporting weight, lean forward, gently stretching calf of back leg. Hold __20-30__ seconds. Repeat with other leg. Repeat __1-2__ times. Do __2__ sessions per day.      Functional Quadriceps: Sit to Stand    Sit on edge of chair, feet flat on floor. Stand upright, extending knees fully. Repeat _10___ times per set. Do __1__ sets per session. Do __1__ sessions per day.  [email or web address]://[email or web address]     BALANCE EXERCISES:    Standing Marching   Using a chair if necessary, march in place. Repeat 10 times. Do 1 sessions per day.  [email or web address]    Hip Backward Kick   Using a chair for balance, keep legs shoulder width apart and toes pointed for- ward. Slowly extend one leg back, keeping knee straight. Do not lean forward. Repeat with other leg. Repeat 1 times. Do 1 sessions per day.   Hip Side Kick   Holding a chair for balance, keep legs shoulder width apart and toes pointed forward. Swing a leg out to side, keeping knee straight. Do not lean. Repeat using other leg. Repeat 10 times. Do 1 sessions per day.    ALSO DO ALTERNATING FORWARD KICKS - 10 REPS EACH LEG - HOLD COUNTER AS NEEDED     Feet Heel-Toe "Tandem", Varied Arm Positions - Eyes Open - PARTIAL HEEL TO TOE   With eyes open, right foot directly in front of the other, arms out, look straight ahead at a stationary object. Hold 20-30 seconds. Repeat 1  times per session. Do 1 sessions per  day.  Copyright  VHI. All rights reserved.       Standing On One Leg Without Support .  Stand on one leg in neutral spine without support. Hold 10 seconds. Repeat on other leg. Do 1-2 repetitions, 1 sets.   Side-Stepping   Walk to left side with eyes open. Take even steps, leading with same foot. Make sure each foot lifts off the floor. Repeat in opposite direction. Repeat 2-3  TIMES ALONG COUNTER.  Do 1 sessions per day.  CAN DO CROSSOVERS FRONT AND THEN BACKWARDS - IF YOU FEEL SAFE ENOUGH      GOALS: Goals reviewed with patient? Yes  SHORT TERM GOALS: Target date: 06/26/2022 (3 weeks)   Independent in HEP for balance and vestibular exercises. Baseline: Goal status: INITIAL  2.  Participate in [place] balance testing.  Baseline:  Goal status: INITIAL  3.  Improve TUG score to </= 23 secs with SPC with SBA. Baseline: 27.25 secs with SPC Goal status: INITIAL  4.  Pt will subjectively report at least 25% improvement in dizziness Baseline:  Goal status: INITIAL   LONG TERM GOALS: Target date: 07/17/2022  (6 weeks)  Improve Berg balance test score by at least 4 points to reduce fall risk. Baseline:  Goal status: INITIAL  2.  Improve FOTO score DPS from 45/100 to at least 53/100 to demo improvement in dizziness. Baseline: DPS 45/100;  DFS 35.5 Goal status: INITIAL  3.  Improve DVA to </= 2 line difference for improved gaze stabilization. Baseline:  Goal status: INITIAL  4.  Amb. 115' with SPC with intermittent horizontal head turns for environmental scanning without LOB, with SBA.  Baseline:  Goal status: INITIAL  5.  Independent in updated HEP for balance and vestibular exercises.  Baseline:  Goal status: INITIAL   ASSESSMENT:  CLINICAL IMPRESSION: PT session focused on establishing HEP for balance, strengthening and stretching exercises.  Pt needs UE support for balance exercises for safety.  Pt was instructed to perform exercises at counter with UE  support prn; daughter was present and verbalized understanding.  Pt reports dizziness which appears to be a dysequilibrium and unsteadiness due to neuropathy in bil. LE's.  Pt reported fatigue at end of session.  Cont with POC.    OBJECTIVE IMPAIRMENTS decreased activity tolerance, decreased balance, difficulty walking, dizziness, impaired vision/preception, and pain.   ACTIVITY LIMITATIONS carrying, lifting, bending, standing, squatting, stairs, and locomotion level  PARTICIPATION LIMITATIONS: meal prep, cleaning, laundry, driving, shopping, and community activity  PERSONAL FACTORS Age, Time since onset of injury/illness/exacerbation, and 1-2 comorbidities: orthostatic hypotension and neuropathy  are also affecting patient's functional outcome.   REHAB POTENTIAL: Fair due to chronicity of deficits  CLINICAL DECISION MAKING: Evolving/moderate complexity  EVALUATION COMPLEXITY: Moderate   PLAN: PT FREQUENCY: 2x/week  PT DURATION: 6 weeks  PLANNED INTERVENTIONS: Therapeutic exercises, Therapeutic activity, Neuromuscular re-education, Balance training, Gait training, Patient/Family education, Self Care, Stair training, and Vestibular training  PLAN FOR NEXT SESSION: check to see if pt has any questions regarding HEP given on 06-24-22:  please do Berg test; if time allows  - any dynamic gait/balance training   Arthur Speagle, [place], PT 06/24/2022, 9:26 PM

## 2022-06-24 NOTE — Patient Instructions (Signed)
Heel Raise: Bilateral (Standing) - HOLD ONTO COUNTER FOR SAFETY    Rise on balls of feet. Repeat _10___ times per set. Do _1___ sets per session. Do _1___ sessions per day.     Gastroc / Heel Cord Stretch - On Step - can use block or book approx. 3" high or bottom shelf of cabinet     Stand with heels over edge of stair. Holding rail, lower heels until stretch is felt in calf of legs. Repeat _1-2__ times. Do _2__ times per day.      Calf / Gastoc: Runners' Stretch I    One leg back and straight, other forward and bent supporting weight, lean forward, gently stretching calf of back leg. Hold __20-30__ seconds. Repeat with other leg. Repeat __1-2__ times. Do __2__ sessions per day.      Functional Quadriceps: Sit to Stand    Sit on edge of chair, feet flat on floor. Stand upright, extending knees fully. Repeat _10___ times per set. Do __1__ sets per session. Do __1__ sessions per day.  [email or web address]     BALANCE EXERCISES:    Standing Marching   Using a chair if necessary, march in place. Repeat 10 times. Do 1 sessions per day.  [email or web address]    Hip Backward Kick   Using a chair for balance, keep legs shoulder width apart and toes pointed for- ward. Slowly extend one leg back, keeping knee straight. Do not lean forward. Repeat with other leg. Repeat 1 times. Do 1 sessions per day.   Hip Side Kick   Holding a chair for balance, keep legs shoulder width apart and toes pointed forward. Swing a leg out to side, keeping knee straight. Do not lean. Repeat using other leg. Repeat 10 times. Do 1 sessions per day.    ALSO DO ALTERNATING FORWARD KICKS - 10 REPS EACH LEG - HOLD COUNTER AS NEEDED     Feet Heel-Toe "Tandem", Varied Arm Positions - Eyes Open - PARTIAL HEEL TO TOE   With eyes open, right foot directly in front of the other, arms out, look straight ahead at a stationary object. Hold 20-30 seconds. Repeat 1  times per  session. Do 1 sessions per day.  Copyright  VHI. All rights reserved.       Standing On One Leg Without Support .  Stand on one leg in neutral spine without support. Hold 10 seconds. Repeat on other leg. Do 1-2 repetitions, 1 sets.  http://bt.exer.us/36   Copyright  VHI. All rights reserved.    Side-Stepping   Walk to left side with eyes open. Take even steps, leading with same foot. Make sure each foot lifts off the floor. Repeat in opposite direction. Repeat 2-3 TIMES ALONG COUNTER.  Do 1 sessions per day.  CAN DO CROSSOVERS FRONT AND THEN BACKWARDS - IF YOU FEEL SAFE ENOUGH   Copyright  VHI. All rights reserved.

## 2022-06-25 ENCOUNTER — Other Ambulatory Visit (HOSPITAL_COMMUNITY): Payer: Self-pay | Admitting: Gastroenterology

## 2022-06-25 DIAGNOSIS — R634 Abnormal weight loss: Secondary | ICD-10-CM

## 2022-06-27 ENCOUNTER — Encounter: Payer: Self-pay | Admitting: Physical Therapy

## 2022-06-27 ENCOUNTER — Ambulatory Visit: Payer: Medicare Other | Admitting: Physical Therapy

## 2022-06-27 DIAGNOSIS — R42 Dizziness and giddiness: Secondary | ICD-10-CM

## 2022-06-27 DIAGNOSIS — R2681 Unsteadiness on feet: Secondary | ICD-10-CM

## 2022-06-27 DIAGNOSIS — R2689 Other abnormalities of gait and mobility: Secondary | ICD-10-CM

## 2022-06-27 NOTE — Therapy (Signed)
OUTPATIENT PHYSICAL THERAPY VESTIBULAR TREATMENT     Patient Name: Justin [name] MRN: [id number] DOB:12-08-1932, 88 y.o., male Today's Date: 06/27/2022  PCP: Willene Hatchet, NP REFERRING PROVIDER: [phone] (ICD-10-CM):  Loura Pardon, MD    PT End of Session - 06/27/22 0804     Visit Number 3    Number of Visits 13    Date for PT Re-Evaluation 07/18/22    Authorization Type UHC Medicare    Authorization Time Period 06-05-22 - 07-29-22    PT Start Time 0803    PT Stop Time 0843    PT Time Calculation (min) 40 min    Equipment Utilized During Treatment Gait belt    Activity Tolerance Patient tolerated treatment well    Behavior During Therapy WFL for tasks assessed/performed              Past Medical History:  Diagnosis Date   Anxiety    Arthritis    Carotid arterial disease ([place])    CKD (chronic kidney disease)    Coronary artery disease    Diabetes mellitus without complication ([place])    Diabetic retinopathy ([place])    NPDR OU   Diverticulitis    Dyspnea    GERD (gastroesophageal reflux disease)    History of kidney stones 2021   Hypercholesteremia    Hypertension    Hypertensive retinopathy    OU   Myocardial infarction [facility]) 2009   Pneumonia    as a child   Prostate cancer ([place])    UTI (lower urinary tract infection)    Past Surgical History:  Procedure Laterality Date   ABDOMINAL SURGERY     for diverticulitis; also removed appendix   APPENDECTOMY     CARDIAC CATHETERIZATION  2018   CATARACT EXTRACTION     CATARACT EXTRACTION, BILATERAL     CHOLECYSTECTOMY N/A 10/12/2017   Procedure: LAPAROSCOPIC CHOLECYSTECTOMY;  Surgeon: [name], MD;  Location: [place];  Service: General;  Laterality: N/A;   CORONARY ARTERY BYPASS GRAFT  2009   ERCP N/A 12/21/2018   Procedure: ENDOSCOPIC RETROGRADE CHOLANGIOPANCREATOGRAPHY (ERCP);  Surgeon: [name], MD;  Location: [place];  Service: Endoscopy;  Laterality: N/A;   ESOPHAGOGASTRODUODENOSCOPY  (EGD) WITH PROPOFOL N/A 12/17/2018   Procedure: ESOPHAGOGASTRODUODENOSCOPY (EGD) WITH PROPOFOL;  Surgeon: [name], MD;  Location: [place];  Service: Endoscopy;  Laterality: N/A;   EUS Left 12/17/2018   Procedure: UPPER ENDOSCOPIC ULTRASOUND (EUS) LINEAR;  Surgeon: [name], MD;  Location: [place];  Service: Endoscopy;  Laterality: Left;   EYE SURGERY     "for bleeding in eye"   HERNIA REPAIR     LEFT HEART CATH AND CORONARY ANGIOGRAPHY N/A 11/04/2016   Procedure: Left Heart Cath and Coronary Angiography;  Surgeon: [name], MD;  Location: [place] CV LAB;  Service: Cardiovascular;  Laterality: N/A;   LOWER EXTREMITY ANGIOGRAPHY N/A 11/04/2016   Procedure: Lower Extremity Angiography;  Surgeon: [name], MD;  Location: [place] CV LAB;  Service: Cardiovascular;  Laterality: N/A;   LOWER EXTREMITY ANGIOGRAPHY N/A 01/03/2020   Procedure: LOWER EXTREMITY ANGIOGRAPHY;  Surgeon: [name], MD;  Location: [place] CV LAB;  Service: Cardiovascular;  Laterality: N/A;   LOWER EXTREMITY ANGIOGRAPHY Bilateral 01/14/2022   Procedure: Lower Extremity Angiography;  Surgeon: [name], MD;  Location: [place] CV LAB;  Service: Cardiovascular;  Laterality: Bilateral;   LUMBAR LAMINECTOMY/DECOMPRESSION MICRODISCECTOMY Bilateral 11/09/2020   Procedure: Laminectomy and Foraminotomy - bilateral - Lumbar Four-Five.;  Surgeon: Earnie Larsson, MD;  Location: [place] OR;  Service: Neurosurgery;  Laterality: Bilateral;  posterior   PROSTATE SURGERY     REMOVAL OF STONES  12/21/2018   Procedure: REMOVAL OF STONES;  Surgeon: [name], MD;  Location[facility];  Service: Endoscopy;;   SPHINCTEROTOMY  12/21/2018   Procedure: [name];  Surgeon: [name], MD;  Location: [place];  Service: Endoscopy;;   Patient Active Problem List   Diagnosis Date Noted   Near syncope 06/15/2021   Dizziness 06/15/2021   Lumbar stenosis with neurogenic claudication 11/09/2020   CKD stage 3 due to  type 2 diabetes mellitus ([place]) 12/14/2019   Asymptomatic bilateral carotid artery stenosis 12/14/2019   Abdominal distention    CAD (coronary artery disease) 12/14/2018   Acute renal failure superimposed on stage 3 chronic kidney disease ([place]) 12/14/2018   UTI (urinary tract infection) 12/14/2018   HLD (hyperlipidemia) 12/14/2018   Sepsis ([place]) 12/14/2018   Abnormal LFTs    S/P CABG (coronary artery bypass graft) 12/06/2018   Asymptomatic stenosis of right carotid artery 12/06/2018   Claudication in peripheral vascular disease ([place]) 12/06/2018   Orthostatic hypotension 12/06/2018   Elevated liver enzymes 12/06/2018   Abdominal pain 10/12/2018   Abnormal liver function tests 10/12/2018   Foreign body in stomach 10/12/2018   Primary hypertension    GERD (gastroesophageal reflux disease)    Diabetes mellitus without complication ([place])    Atherosclerosis of native coronary artery of native heart without angina pectoris     ONSET DATE: approx. A year ago for onset of symptoms:  Referral date 05-26-22  REFERRING DIAG: R42 (ICD-10-CM) - Dizziness and giddiness   THERAPY DIAG:  Unsteadiness on feet  Dizziness and giddiness  Other abnormalities of gait and mobility  Rationale for Evaluation and Treatment Rehabilitation  SUBJECTIVE:   SUBJECTIVE STATEMENT: Has been doing the exercises at home, feels kinda stiff after doing them. Has no questions regarding them. No recent falls.   Pt accompanied by: family member - daughter  PERTINENT HISTORY: CKD stage 3 due to DM type 2:  h/o MI 2009: lumbar stenosis:  orthostatic hypotension:  HTN, h/o near syncope, carotid arterial disease, peripheral neuropathy, diabetic retinopathy   PAIN:  Are you having pain? Yes: NPRS scale: 6/10 Pain location: "from his head to his toes" Pain description: burning, tingling, tightness as if a compressive force is on RLE (due to edema) Aggravating factors: unknown Relieving factors:  unknown  PRECAUTIONS: None  WEIGHT BEARING RESTRICTIONS No  FALLS: Has patient fallen in last 6 months? Yes. Number of falls 1  LIVING ENVIRONMENT: Lives with: lives with their family Lives in: House/apartment Has following equipment at home: Single point cane and Environmental consultant - 2 wheeled  PLOF: Independent with basic ADLs and Independent with household mobility with device  PATIENT GOALS "stop being dizzy":   OBJECTIVE:  NEURO TREATMENT:  GAIT: Gait pattern: step through pattern Distance walked: 100' - slow speed Assistive device utilized: Single point cane; pt amb. Short clinic distances (from mat table to counter without SPC) Level of assistance: supervision  Goal Assessment:  [facility] PT Assessment - 06/27/22 0807       Standardized Balance Assessment   Standardized Balance Assessment Berg Balance Test      Berg Balance Test   Sit to Stand Able to stand without using hands and stabilize independently    Standing Unsupported Able to stand safely 2 minutes    Sitting with Back Unsupported but Feet Supported on Floor or Stool Able to sit safely  and securely 2 minutes    Stand to Sit Sits safely with minimal use of hands    Transfers Able to transfer safely, minor use of hands    Standing Unsupported with Eyes Closed Able to stand 10 seconds safely    Standing Unsupported with Feet Together Able to place feet together independently and stand for 1 minute with supervision    From Standing, Reach Forward with Outstretched Arm Can reach forward >12 cm safely (5")   7"   From Standing Position, Pick up Object from Floor Able to pick up shoe, needs supervision    From Standing Position, Turn to Look Behind Over each Shoulder Looks behind from both sides and weight shifts well    Turn 360 Degrees Able to turn 360 degrees safely but slowly   7.25 to R, 8.31 to L   Standing Unsupported, Alternately Place Feet on Step/Stool Able to complete >2 steps/needs minimal assist    Standing  Unsupported, One Foot in Front Able to take small step independently and hold 30 seconds    Standing on One Leg Tries to lift leg/unable to hold 3 seconds but remains standing independently    Total Score 43    Berg comment: 43/56 = Significant Fall Risk            TUG: 18.75 seconds with SPC     Standing Balance: Surface: Floor Position: Narrow Base of Support - Feet together Completed with: Eyes Open; 2 sets of 10 reps head turns, 2 sets of 10 reps head nods. Pt more challenged with nods, needing intermittent taps to chair for balance   Tandem Stance: - 3/4 Tandem  Surface: Floor Completed with: Eyes Open;   Time: 2 x 30 seconds bilat, min guard, intermittent taps to wall/chair for balance    At countertop with single UE support, forward/retro marching down and back x3 reps, cues for ROM with march, pt losing balance at times into the countertop when going backwards, min guard needed. Pt's legs fatigued after marching and needing a seated break.  On blue mat for compliant surface: side stepping down and back x3 reps, cues for incr foot clearance when stepping, with BUE support x10 reps heel raises - cues for incr ROM and keeping knees straight.       PATIENT EDUCATION: Education details:  Results of goals, continue with HEP  Person educated: Patient and daughter Education method: Explanation  Education comprehension: verbalized understanding  HEP: Balance exercises- issued for HEP (see PT note on 06/24/22)   GOALS: Goals reviewed with patient? Yes  SHORT TERM GOALS: Target date: 06/26/2022 (3 weeks)   Independent in HEP for balance and vestibular exercises. Baseline: pt reports performing at home.  Goal status: MET  2.  Participate in [place] balance testing.  Baseline: 43/56 Goal status: MET  3.  Improve TUG score to </= 23 secs with SPC with SBA. Baseline: 27.25 secs with SPC; 18.75 seconds on 06/27/22 Goal status: MET  4.  Pt will subjectively report at least  25% improvement in dizziness Baseline: "yeah I would say so" Goal status: MET   LONG TERM GOALS: Target date: 07/17/2022  (6 weeks)  Improve Berg balance test score by at least 4 points to reduce fall risk. Baseline: 43/56 Goal status: INITIAL  2.  Improve FOTO score DPS from 45/100 to at least 53/100 to demo improvement in dizziness. Baseline: DPS 45/100;  DFS 35.5 Goal status: INITIAL  3.  Improve DVA to </= 2 line  difference for improved gaze stabilization. Baseline:  Goal status: INITIAL  4.  Amb. 115' with SPC with intermittent horizontal head turns for environmental scanning without LOB, with SBA.  Baseline:  Goal status: INITIAL  5.  Independent in updated HEP for balance and vestibular exercises.  Baseline:  Goal status: INITIAL   ASSESSMENT:  CLINICAL IMPRESSION: Assessed pt's STGs today with pt meeting 4 out of 4 STGs. Pt improved TUG to 18.75 seconds with SPC (previously 27.25 seconds), decr pt's risk of falls. Pt scored a 43/56 on the BERG today, indicating that pt is at a significant fall risk. Remainder of session focused on balance with narrow BOS tasks, SLS, and unlevel surfaces. Pt tolerated session well, reporting legs felt tired at the end. Needing intermittent rest breaks during session. Will continue per POC.    OBJECTIVE IMPAIRMENTS decreased activity tolerance, decreased balance, difficulty walking, dizziness, impaired vision/preception, and pain.   ACTIVITY LIMITATIONS carrying, lifting, bending, standing, squatting, stairs, and locomotion level  PARTICIPATION LIMITATIONS: meal prep, cleaning, laundry, driving, shopping, and community activity  PERSONAL FACTORS Age, Time since onset of injury/illness/exacerbation, and 1-2 comorbidities: orthostatic hypotension and neuropathy  are also affecting patient's functional outcome.   REHAB POTENTIAL: Fair due to chronicity of deficits  CLINICAL DECISION MAKING: Evolving/moderate complexity  EVALUATION  COMPLEXITY: Moderate   PLAN: PT FREQUENCY: 2x/week  PT DURATION: 6 weeks  PLANNED INTERVENTIONS: Therapeutic exercises, Therapeutic activity, Neuromuscular re-education, Balance training, Gait training, Patient/Family education, Self Care, Stair training, and Vestibular training  PLAN FOR NEXT SESSION: review and update HEP as needed. any dynamic gait/balance training, balance with SLS/narrow BOS, functional strength.    Arliss Journey, PT, DPT  06/27/2022, 8:46 AM

## 2022-07-01 ENCOUNTER — Ambulatory Visit: Payer: Medicare Other | Attending: Family Medicine | Admitting: Physical Therapy

## 2022-07-01 ENCOUNTER — Encounter: Payer: Self-pay | Admitting: Physical Therapy

## 2022-07-01 DIAGNOSIS — R42 Dizziness and giddiness: Secondary | ICD-10-CM | POA: Insufficient documentation

## 2022-07-01 DIAGNOSIS — R2681 Unsteadiness on feet: Secondary | ICD-10-CM | POA: Diagnosis not present

## 2022-07-01 DIAGNOSIS — R2689 Other abnormalities of gait and mobility: Secondary | ICD-10-CM | POA: Insufficient documentation

## 2022-07-01 NOTE — Therapy (Signed)
OUTPATIENT PHYSICAL THERAPY VESTIBULAR TREATMENT     Patient Name: Justin [name] MRN: [id number] DOB:07-19-1933, 88 y.o., male Today's Date: 07/01/2022  PCP: Willene Hatchet, NP REFERRING PROVIDER: [phone] (ICD-10-CM):  Loura Pardon, MD    PT End of Session - 07/01/22 1256     Visit Number 4    Number of Visits 13    Date for PT Re-Evaluation 07/18/22    Authorization Type UHC Medicare    Authorization Time Period 06-05-22 - 07-29-22    PT Start Time 0803    PT Stop Time 0845    PT Time Calculation (min) 42 min    Equipment Utilized During Treatment Gait belt    Activity Tolerance Patient tolerated treatment well    Behavior During Therapy WFL for tasks assessed/performed              Past Medical History:  Diagnosis Date   Anxiety    Arthritis    Carotid arterial disease ([place])    CKD (chronic kidney disease)    Coronary artery disease    Diabetes mellitus without complication ([place])    Diabetic retinopathy ([place])    NPDR OU   Diverticulitis    Dyspnea    GERD (gastroesophageal reflux disease)    History of kidney stones 2021   Hypercholesteremia    Hypertension    Hypertensive retinopathy    OU   Myocardial infarction [facility]) 2009   Pneumonia    as a child   Prostate cancer ([place])    UTI (lower urinary tract infection)    Past Surgical History:  Procedure Laterality Date   ABDOMINAL SURGERY     for diverticulitis; also removed appendix   APPENDECTOMY     CARDIAC CATHETERIZATION  2018   CATARACT EXTRACTION     CATARACT EXTRACTION, BILATERAL     CHOLECYSTECTOMY N/A 10/12/2017   Procedure: LAPAROSCOPIC CHOLECYSTECTOMY;  Surgeon: [name], MD;  Location: [place];  Service: General;  Laterality: N/A;   CORONARY ARTERY BYPASS GRAFT  2009   ERCP N/A 12/21/2018   Procedure: ENDOSCOPIC RETROGRADE CHOLANGIOPANCREATOGRAPHY (ERCP);  Surgeon: [name], MD;  Location: [place];  Service: Endoscopy;  Laterality: N/A;   ESOPHAGOGASTRODUODENOSCOPY  (EGD) WITH PROPOFOL N/A 12/17/2018   Procedure: ESOPHAGOGASTRODUODENOSCOPY (EGD) WITH PROPOFOL;  Surgeon: [name], MD;  Location: [place];  Service: Endoscopy;  Laterality: N/A;   EUS Left 12/17/2018   Procedure: UPPER ENDOSCOPIC ULTRASOUND (EUS) LINEAR;  Surgeon: [name], MD;  Location: [place];  Service: Endoscopy;  Laterality: Left;   EYE SURGERY     "for bleeding in eye"   HERNIA REPAIR     LEFT HEART CATH AND CORONARY ANGIOGRAPHY N/A 11/04/2016   Procedure: Left Heart Cath and Coronary Angiography;  Surgeon: [name], MD;  Location: [place] CV LAB;  Service: Cardiovascular;  Laterality: N/A;   LOWER EXTREMITY ANGIOGRAPHY N/A 11/04/2016   Procedure: Lower Extremity Angiography;  Surgeon: [name], MD;  Location: [place] CV LAB;  Service: Cardiovascular;  Laterality: N/A;   LOWER EXTREMITY ANGIOGRAPHY N/A 01/03/2020   Procedure: LOWER EXTREMITY ANGIOGRAPHY;  Surgeon: [name], MD;  Location: [place] CV [facility];  Service: Cardiovascular;  Laterality: N/A;   LOWER EXTREMITY ANGIOGRAPHY Bilateral 01/14/2022   Procedure: Lower Extremity Angiography;  Surgeon: [name], MD;  Location: [place] CV LAB;  Service: Cardiovascular;  Laterality: Bilateral;   LUMBAR LAMINECTOMY/DECOMPRESSION MICRODISCECTOMY Bilateral 11/09/2020   Procedure: Laminectomy and Foraminotomy - bilateral - Lumbar Four-Five.;  Surgeon: Earnie Larsson, MD;  Location: [place] OR;  Service: Neurosurgery;  Laterality: Bilateral;  posterior   PROSTATE SURGERY     REMOVAL OF STONES  12/21/2018   Procedure: REMOVAL OF STONES;  Surgeon: [name], MD;  [facility] ENDOSCOPY;  Service: Endoscopy;;   SPHINCTEROTOMY  12/21/2018   Procedure: [name];  Surgeon: [name], MD;  Location: [place];  Service: Endoscopy;;   Patient Active Problem List   Diagnosis Date Noted   Near syncope 06/15/2021   Dizziness 06/15/2021   Lumbar stenosis with neurogenic claudication 11/09/2020   CKD stage 3 due to  type 2 diabetes mellitus ([place]) 12/14/2019   Asymptomatic bilateral carotid artery stenosis 12/14/2019   Abdominal distention    CAD (coronary artery disease) 12/14/2018   Acute renal failure superimposed on stage 3 chronic kidney disease ([place]) 12/14/2018   UTI (urinary tract infection) 12/14/2018   HLD (hyperlipidemia) 12/14/2018   Sepsis ([place]) 12/14/2018   Abnormal LFTs    S/P CABG (coronary artery bypass graft) 12/06/2018   Asymptomatic stenosis of right carotid artery 12/06/2018   Claudication in peripheral vascular disease ([place]) 12/06/2018   Orthostatic hypotension 12/06/2018   Elevated liver enzymes 12/06/2018   Abdominal pain 10/12/2018   Abnormal liver function tests 10/12/2018   Foreign body in stomach 10/12/2018   Primary hypertension    GERD (gastroesophageal reflux disease)    Diabetes mellitus without complication ([place])    Atherosclerosis of native coronary artery of native heart without angina pectoris     ONSET DATE: approx. A year ago for onset of symptoms:  Referral date 05-26-22  REFERRING DIAG: R42 (ICD-10-CM) - Dizziness and giddiness   THERAPY DIAG:  Unsteadiness on feet  Other abnormalities of gait and mobility  Rationale for Evaluation and Treatment Rehabilitation  SUBJECTIVE:   SUBJECTIVE STATEMENT: Daughter states pt's balance is improving - says he is walking more without use of cane in the home for short distances   Pt accompanied by: family member - daughter  PERTINENT HISTORY: CKD stage 3 due to DM type 2:  h/o MI 2009: lumbar stenosis:  orthostatic hypotension:  HTN, h/o near syncope, carotid arterial disease, peripheral neuropathy, diabetic retinopathy   PAIN:  "Usual pain due to the neuropathy" Are you having pain? Yes: NPRS scale: 5-6/10 Pain location: "from his head to his toes" Pain description: burning, tingling, tightness as if a compressive force is on RLE (due to edema) Aggravating factors: unknown Relieving factors:  unknown  PRECAUTIONS: None  WEIGHT BEARING RESTRICTIONS No  FALLS: Has patient fallen in last 6 months? Yes. Number of falls 1  LIVING ENVIRONMENT: Lives with: lives with their family Lives in: House/apartment Has following equipment at home: Single point cane and Environmental consultant - 2 wheeled  PLOF: Independent with basic ADLs and Independent with household mobility with device  PATIENT GOALS "stop being dizzy":   OBJECTIVE:   NEURO TREATMENT:  GAIT: Gait pattern: step through pattern; pt using SPC to amb. From lobby to gym Distance walked: 230' - 2 laps around track - verbal cues to walk fast (no significant increased speed achieved) Assistive device utilized: No device used  Level of assistance: CGA   Sit to stand - feet on floor 5 reps from high/low mat table; feet on [employer] - sit to stand with CGA due to unsteadinesswith minimal UE support from mat table  Tap ups to 1st step - 6" - 5 reps each foot alternating; tap ups to 2nd step 5 reps each foot with CGA to min assist for balance  recovery  Rockerboard - inside // bars - 10 reps with bil. UE support; 10 reps with 2 finger support on each hand; 10 reps without UE support with min assist Stepping over/back of balance beam 5 reps each leg; 5 reps sideways with UE support on // bars prn with CGA  Pt performed amb. 35' x 2 reps forward tossing and catching large ball; then progressed to smaller ball amb. 35' x 2 reps with CGA to min assist for balance recovery  Pt performed backwards amb. 30' x 2 reps with min assist for balance  Pt performed cone taps standing on floor - 2 reps each foot to 3 cones with CGA to min assist;  progressed to standing on blue mat - 4 cones used - cone taps with min to mod assist with balance    Pt reported legs were getting tired at end of session - seated rest period prior to pt amb. out of gym to lobby     PATIENT EDUCATION: Education details:  Results of goals, continue with HEP  Person educated:  Patient and daughter Education method: Explanation  Education comprehension: verbalized understanding  HEP: Balance exercises- issued for HEP (see PT note on 06/24/22)   GOALS: Goals reviewed with patient? Yes  SHORT TERM GOALS: Target date: 06/26/2022 (3 weeks)   Independent in HEP for balance and vestibular exercises. Baseline: pt reports performing at home.  Goal status: MET  2.  Participate in [place] balance testing.  Baseline: 43/56 Goal status: MET  3.  Improve TUG score to </= 23 secs with SPC with SBA. Baseline: 27.25 secs with SPC; 18.75 seconds on 06/27/22 Goal status: MET  4.  Pt will subjectively report at least 25% improvement in dizziness Baseline: "yeah I would say so" Goal status: MET   LONG TERM GOALS: Target date: 07/17/2022  (6 weeks)  Improve Berg balance test score by at least 4 points to reduce fall risk. Baseline: 43/56 Goal status: INITIAL  2.  Improve FOTO score DPS from 45/100 to at least 53/100 to demo improvement in dizziness. Baseline: DPS 45/100;  DFS 35.5 Goal status: INITIAL  3.  Improve DVA to </= 2 line difference for improved gaze stabilization. Baseline:  Goal status: INITIAL  4.  Amb. 115' with SPC with intermittent horizontal head turns for environmental scanning without LOB, with SBA.  Baseline:  Goal status: INITIAL  5.  Independent in updated HEP for balance and vestibular exercises.  Baseline:  Goal status: INITIAL   ASSESSMENT:  CLINICAL IMPRESSION: PT session focused on static and dynamic standing balance exercises to improve balance and also activities to improve balance with gait.  Pt's self selected gait speed not significantly increased from attempted faster gait, even with verbal cueing.  Pt does demonstrate improved balance on noncompliant surfaces but has moderate unsteadiness with standing on compliant surfaces.  Continue per POC.    OBJECTIVE IMPAIRMENTS decreased activity tolerance, decreased balance,  difficulty walking, dizziness, impaired vision/preception, and pain.   ACTIVITY LIMITATIONS carrying, lifting, bending, standing, squatting, stairs, and locomotion level  PARTICIPATION LIMITATIONS: meal prep, cleaning, laundry, driving, shopping, and community activity  PERSONAL FACTORS Age, Time since onset of injury/illness/exacerbation, and 1-2 comorbidities: orthostatic hypotension and neuropathy  are also affecting patient's functional outcome.   REHAB POTENTIAL: Fair due to chronicity of deficits  CLINICAL DECISION MAKING: Evolving/moderate complexity  EVALUATION COMPLEXITY: Moderate   PLAN: PT FREQUENCY: 2x/week  PT DURATION: 6 weeks  PLANNED INTERVENTIONS: Therapeutic exercises, Therapeutic activity, Neuromuscular re-education, Balance  training, Gait training, Patient/Family education, Self Care, Stair training, and Vestibular training  PLAN FOR NEXT SESSION: any dynamic gait/balance training, balance with SLS, compliant surfaces, etc. - functional strengthening    Wright Gravely, [place], PT 07/01/2022, 4:29 PM

## 2022-07-03 ENCOUNTER — Telehealth (HOSPITAL_COMMUNITY): Payer: Self-pay | Admitting: *Deleted

## 2022-07-03 ENCOUNTER — Ambulatory Visit (HOSPITAL_COMMUNITY)
Admission: RE | Admit: 2022-07-03 | Discharge: 2022-07-03 | Disposition: A | Discharge status: Home / Self Care | Payer: Medicare Other | Source: Ambulatory Visit | Attending: Gastroenterology | Admitting: Gastroenterology

## 2022-07-03 DIAGNOSIS — E119 Type 2 diabetes mellitus without complications: Secondary | ICD-10-CM | POA: Diagnosis present

## 2022-07-03 DIAGNOSIS — R634 Abnormal weight loss: Secondary | ICD-10-CM | POA: Diagnosis present

## 2022-07-03 LAB — POCT I-STAT CREATININE: Creatinine, Ser: 1.2 mg/dL (ref 0.61–1.24)

## 2022-07-03 MED ORDER — IOHEXOL 350 MG/ML SOLN
100.0000 mL | Freq: Once | INTRAVENOUS | Status: AC | PRN
  Administered 2022-07-03: 70 mL via INTRAVENOUS

## 2022-07-04 ENCOUNTER — Ambulatory Visit: Payer: Medicare Other | Admitting: Physical Therapy

## 2022-07-08 ENCOUNTER — Ambulatory Visit: Payer: Medicare Other | Admitting: Physical Therapy

## 2022-07-09 ENCOUNTER — Encounter: Payer: Self-pay | Admitting: Cardiology

## 2022-07-10 NOTE — Progress Notes (Signed)
[facility] Note  07/16/2022     CHIEF COMPLAINT Patient presents for Retina Follow Up    HISTORY OF PRESENT ILLNESS: Justin [name] is a 88 y.o. male who presents to the clinic today for:   HPI     Retina Follow Up   Patient presents with  Diabetic Retinopathy.  In both eyes.  This started 10 weeks ago.  I, the attending physician,  performed the HPI with the patient and updated documentation appropriately.        Comments   Patient here for 10 weeks retina follow up for NPDR OU. Patient states vision doing pretty good. Has that vertigo-gets blurry sometimes. No eye pain. The itch and runs water-allergies.      Last edited by [name], MD on 07/16/2022 11:54 AM.     Patient states the vision is getting blurry.  Referring physician: Willene Hatchet, NP [place],  [place] [address]  HISTORICAL INFORMATION:   Selected notes from the MEDICAL RECORD NUMBER Referred by Dr. [name] for concern of mac edema OU   CURRENT MEDICATIONS: No current outpatient medications on file. (Ophthalmic Drugs)   No current facility-administered medications for this visit. (Ophthalmic Drugs)   Current Outpatient Medications (Other)  Medication Sig   ACCU-CHEK SMARTVIEW test strip    Accu-Chek Softclix Lancets lancets    acetaminophen (TYLENOL) 500 MG tablet Take 1,000 mg by mouth 2 (two) times daily as needed for mild pain.   Blood Glucose Monitoring Suppl (ACCU-CHEK GUIDE) w/Device KIT    cholecalciferol (VITAMIN D3) 25 MCG (1000 UNIT) tablet Take 1,000 Units by mouth daily.   cilostazol (PLETAL) 50 MG tablet Take 1 tablet (50 mg total) by mouth 2 (two) times daily. (Patient taking differently: Take 50 mg by mouth 2 (two) times daily. HOLD)   clonazePAM (KLONOPIN) 0.5 MG tablet Take 0.5 mg by mouth daily as needed for anxiety.   docusate sodium (COLACE) 50 MG capsule Take 50 mg by mouth 2 (two) times daily.   DULoxetine  (CYMBALTA) 30 MG capsule Take 2 capsules (60 mg total) by mouth daily.   ezetimibe (ZETIA) 10 MG tablet TAKE 1 TABLET DAILY AFTER SUPPER (Patient taking differently: Take 10 mg by mouth every evening.)   hydrALAZINE (APRESOLINE) 100 MG tablet Take 1 tablet (100 mg total) by mouth 3 (three) times daily.   isosorbide dinitrate (ISORDIL) 30 MG tablet Take 1 tablet (30 mg total) by mouth 3 (three) times daily.   labetalol (NORMODYNE) 200 MG tablet Take 1 tablet (200 mg total) by mouth 2 (two) times daily.   LANTUS SOLOSTAR 100 UNIT/ML Solostar Pen Inject 10 Units into the skin 2 (two) times daily. Inject 10 units in the morning and 10 units in the evening.   linaclotide (LINZESS) 145 MCG CAPS capsule Take 145 mcg by mouth daily as needed (constipation).   losartan (COZAAR) 50 MG tablet Take 1 tablet (50 mg total) by mouth daily.   meclizine (ANTIVERT) 25 MG tablet Take 1 tablet (25 mg total) by mouth 3 (three) times daily as needed for dizziness.   omeprazole (PRILOSEC) 40 MG capsule Take 40 mg by mouth daily.    Pyridoxine HCl (B-6 PO) Take 1 capsule by mouth daily.   simvastatin (ZOCOR) 20 MG tablet Take 20 mg by mouth at bedtime.   tamsulosin (FLOMAX) 0.4 MG CAPS capsule Take 1 capsule (0.4 mg total) by mouth daily after supper.   Turmeric (  QC TUMERIC COMPLEX PO) Take 1 capsule by mouth daily.   vitamin B-12 (CYANOCOBALAMIN) 500 MCG tablet Take 500 mcg by mouth daily.   Vitamin E 268 MG (400 UNIT) CAPS Take 400 Units by mouth daily.   No current facility-administered medications for this visit. (Other)   REVIEW OF SYSTEMS: ROS   Positive for: Gastrointestinal, Genitourinary, Endocrine, Cardiovascular, Eyes Negative for: Constitutional, Neurological, Skin, Musculoskeletal, HENT, Respiratory, Psychiatric, Allergic/Imm, Heme/Lymph Last edited by [name], COA on 07/16/2022  9:11 AM.     ALLERGIES Allergies  Allergen Reactions   Contrast Media [Iodinated Contrast Media] Itching     PT STATES ALLERGY TO "CT DYE".   Amlodipine Swelling    Leg swelling   Dye Fdc Red [Red Dye] Swelling    CAT scan dye   Ibuprofen Other (See Comments)    Upset stomach    PAST MEDICAL HISTORY Past Medical History:  Diagnosis Date   Anxiety    Arthritis    Carotid arterial disease ([place])    CKD (chronic kidney disease)    Coronary artery disease    Diabetes mellitus without complication (HCC)    Diabetic retinopathy ([place])    NPDR OU   Diverticulitis    Dyspnea    GERD (gastroesophageal reflux disease)    History of kidney stones 2021   Hypercholesteremia    Hypertension    Hypertensive retinopathy    OU   Myocardial infarction [facility]) 2009   Pneumonia    as a child   Prostate cancer ([place])    UTI (lower urinary tract infection)    Past Surgical History:  Procedure Laterality Date   ABDOMINAL SURGERY     for diverticulitis; also removed appendix   APPENDECTOMY     CARDIAC CATHETERIZATION  2018   CATARACT EXTRACTION     CATARACT EXTRACTION, BILATERAL     CHOLECYSTECTOMY N/A 10/12/2017   Procedure: LAPAROSCOPIC CHOLECYSTECTOMY;  Surgeon: [name], MD;  Location: [place];  Service: General;  Laterality: N/A;   CORONARY ARTERY BYPASS GRAFT  2009   ERCP N/A 12/21/2018   Procedure: ENDOSCOPIC RETROGRADE CHOLANGIOPANCREATOGRAPHY (ERCP);  Surgeon: [name], MD;  Location: [place];  Service: Endoscopy;  Laterality: N/A;   ESOPHAGOGASTRODUODENOSCOPY (EGD) WITH PROPOFOL N/A 12/17/2018   Procedure: ESOPHAGOGASTRODUODENOSCOPY (EGD) WITH PROPOFOL;  Surgeon: [name], MD;  Location: [place];  Service: Endoscopy;  Laterality: N/A;   EUS Left 12/17/2018   Procedure: UPPER ENDOSCOPIC ULTRASOUND (EUS) LINEAR;  Surgeon: [name], MD;  Location: [place];  Service: Endoscopy;  Laterality: Left;   EYE SURGERY     "for bleeding in eye"   HERNIA REPAIR     LEFT HEART CATH AND CORONARY ANGIOGRAPHY N/A 11/04/2016   Procedure: Left Heart Cath and Coronary  Angiography;  Surgeon: [name], MD;  Location: [place] [facility];  Service: Cardiovascular;  Laterality: N/A;   LOWER EXTREMITY ANGIOGRAPHY N/A 11/04/2016   Procedure: Lower Extremity Angiography;  Surgeon: [name], MD;  Location: [place] [facility];  Service: Cardiovascular;  Laterality: N/A;   LOWER EXTREMITY ANGIOGRAPHY N/A 01/03/2020   Procedure: LOWER EXTREMITY ANGIOGRAPHY;  Surgeon: [name], MD;  Location: [place] [facility];  Service: Cardiovascular;  Laterality: N/A;   LOWER EXTREMITY ANGIOGRAPHY Bilateral 01/14/2022   Procedure: Lower Extremity Angiography;  Surgeon: [name], MD;  Location: [place] [facility];  Service: Cardiovascular;  Laterality: Bilateral;   LUMBAR LAMINECTOMY/DECOMPRESSION MICRODISCECTOMY Bilateral 11/09/2020   Procedure: Laminectomy and Foraminotomy - bilateral - Lumbar Four-Five.;  Surgeon: Earnie Larsson, MD;  Location: [place];  Service: Neurosurgery;  Laterality: Bilateral;  posterior   PROSTATE SURGERY     REMOVAL OF STONES  12/21/2018   Procedure: REMOVAL OF STONES;  Surgeon: [name], MD;  Location: [place];  Service: Endoscopy;;   SPHINCTEROTOMY  12/21/2018   Procedure: [name];  Surgeon: [name], MD;  Location: [facility];  Service: Endoscopy;;   FAMILY HISTORY Family History  Problem Relation Age of Onset   Diabetes Father    Diabetes Maternal Aunt    Diabetes Maternal Uncle    Diabetes Maternal Grandmother    Heart disease Sister    Diabetes Brother    Heart disease Sister    SOCIAL HISTORY Social History   Tobacco Use   Smoking status: Former    Packs/day: 0.25    Years: 2.00    Total pack years: 0.50    Types: Cigarettes   Smokeless tobacco: Never   Tobacco comments:    when he was a teenage age [age]  Vaping Use   Vaping Use: Never used  Substance Use Topics   Alcohol use: No   Drug use: No       OPHTHALMIC EXAM: Base Eye Exam     Visual Acuity (Snellen - Linear)       Right Left   Dist cc 20/25 -2  20/25 -2   Dist ph cc NI NI         Tonometry (Tonopen, 9:08 AM)       Right Left   Pressure 13 15         Pupils       Dark Light Shape React APD   Right 3 2 Round Brisk None   Left 3 2 Round Brisk None         Visual Fields (Counting fingers)       Left Right    Full Full         Extraocular Movement       Right Left    Full, Ortho Full, Ortho         Neuro/Psych     Oriented x3: Yes   Mood/Affect: Normal         Dilation     Both eyes: 1.0% Mydriacyl, 2.5% Phenylephrine @ 9:08 AM           Slit Lamp and Fundus Exam     Slit Lamp Exam       Right Left   Lids/Lashes Dermatochalasis - upper lid, Meibomian gland dysfunction Dermatochalasis - upper lid, Meibomian gland dysfunction   Conjunctiva/Sclera Melanosis Melanosis   Cornea Arcus, trace Punctate epithelial erosions, mild endopigment Arcus, 1-2+ Punctate epithelial erosions, trace endopigment, mild corneal haze   Anterior Chamber Deep and quiet Deep and quiet   Iris Mild Temporal Iris atrophy, Round and dilated, No NVI Mild Temporal Iris atrophy, Round and dilated, No NVI   Lens PC IOL in good position with mild aterior capsule fimosis PC IOL in good position with mild aterior capsule fimosis   Anterior Vitreous Vitreous syneresis, Posterior vitreous detachment, vitreous condensations Vitreous syneresis, Posterior vitreous detachment, vitreous condensations         Fundus Exam       Right Left   Disc pallor with sharp rim, Peripapillary atrophy, Compact mild pallor, sharp rim, Tilted disc, mild temporal Peripapillary atrophy, Compact   C/D Ratio 0.2 0.3   Macula Blunted foveal reflex, Retinal pigment epithelial mottling and clumping, Microaneurysms -- improved,  persistent central cystic changes -- slightly improved good foveal reflex, focal area of RPE atrophy SN to fovea, rare Microaneurysms, Retinal pigment epithelial mottling, refractile Epiretinal membrane, central cystic changes -   slightly increased   Vessels Vascular attenuation, Tortuous, AV crossing changes, mild Copper wiring attenuated, Tortuous   Periphery Attached, rare, scattered MA, good peripheral PRP 360 - room for posterior fill in if needed Attached, rare, scattered MA, Peripheral PRP 360 -- room for posterior fill in if needed, focal, white-centered IRH IN to disc--improved           Refraction     Wearing Rx       Sphere Cylinder Axis Add   Right -0.75 +1.75 175 +3.00   Left -0.25 +1.50 180 +3.00    Type: Bifocal           IMAGING AND PROCEDURES  Imaging and Procedures for _0 @  OCT, Retina - OU - Both Eyes       Right Eye Quality was good. Central Foveal Thickness: 269. Progression has improved. Findings include no SRF, abnormal foveal contour, retinal drusen , intraretinal fluid, outer retinal atrophy (Interval improvement in central IRF/IRHM).   Left Eye Quality was good. Central Foveal Thickness: 258. Progression has worsened. Findings include normal foveal contour, no IRF, no SRF, retinal drusen , intraretinal hyper-reflective material, outer retinal atrophy (Mild interval increase of IRF ST fovea, just trace IRHM remains).   Notes *Images captured and stored on drive  Diagnosis / Impression:  DME OU, OD>OS OD: Interval improvement in central IRF/IRHM OS: Mild interval increase of IRF ST fovea, just trace IRHM remains  Clinical management:  See below  Abbreviations: NFP - Normal foveal profile. CME - cystoid macular edema. PED - pigment epithelial detachment. IRF - intraretinal fluid. SRF - subretinal fluid. EZ - ellipsoid zone. ERM - epiretinal membrane. ORA - outer retinal atrophy. ORT - outer retinal tubulation. SRHM - subretinal hyper-reflective material       Intravitreal Injection, Pharmacologic Agent - OD - Right Eye       Time Out 07/16/2022. 9:45 AM. Confirmed correct patient, procedure, site, and patient consented.   Anesthesia Topical anesthesia was  used. Anesthetic medications included Lidocaine 2%, Proparacaine 0.5%.   Procedure Preparation included 5% betadine to ocular surface, eyelid speculum.   Injection: 2 mg aflibercept 2 MG/0.05ML   Route: Intravitreal, Site: Right Eye   NDC: A3590391, Lot: [id number], Expiration date: 10/30/2023, Waste: 0 mL   Post-op Post injection exam found visual acuity of at least counting fingers. The patient tolerated the procedure well. There were no complications. The patient received written and verbal post procedure care education.      Intravitreal Injection, Pharmacologic Agent - OS - Left Eye       Time Out 07/16/2022. 10:04 AM. Confirmed correct patient, procedure, site, and patient consented.   Anesthesia Topical anesthesia was used. Anesthetic medications included Lidocaine 2%, Proparacaine 0.5%.   Procedure Preparation included 5% betadine to ocular surface, eyelid speculum. A (32 g) needle was used.   Injection: 2 mg aflibercept 2 MG/0.05ML   Route: Intravitreal, Site: Left Eye   NDC: A3590391, Lot: [id number], Expiration date: 10/30/2023, Waste: 0 mL   Post-op Post injection exam found visual acuity of at least counting fingers. The patient tolerated the procedure well. There were no complications. The patient received written and verbal post procedure care education. Post injection medications were not given.            ASSESSMENT/PLAN:  ICD-10-CM   1. Moderate nonproliferative diabetic retinopathy of both eyes with macular edema associated with type 2 diabetes mellitus (HCC)  E11.3313 OCT, Retina - OU - Both Eyes    Intravitreal Injection, Pharmacologic Agent - OD - Right Eye    Intravitreal Injection, Pharmacologic Agent - OS - Left Eye    aflibercept (EYLEA) SOLN 2 mg    aflibercept (EYLEA) SOLN 2 mg    2. Essential hypertension  I10     3. Hypertensive retinopathy of both eyes  H35.033     4. Pseudophakia of both eyes  Z96.1      1. Moderate  non-proliferative diabetic retinopathy with edema OU  - moved to [place] from [place], [place] -- history of prior injections OS with Dr. [name]  - records received and reviewed from [facility] (Dr. [name])  - history of focal laser OS x2 and IVK/IVT OU in [place] -- last injection was IVK OS November 2013  - initial exam: scattered IRH, DBH and +macular edema  - initial OCT: diabetic macular edema, both eyes, (OD > OS)  - FA (10.1.19) showed late leaking MA OU -- no NV  - delayed f/u on 4.16.20 due to hospitalization for sepsis / gallstones  - S/P PRP OD (07.14.20) - good laser surrounding, room for posterior fill in if needed  - S/P PRP OS (08.26.20)  - S/P IVA OD #1 (09.17.19), #2 (10.29.19), #3 (11.26.19), #4 (01.02.20), #5 (01.30.20), #6 (02.28.20), #7 (04.16.20), #8 (06.11.20), #9 (08.12.20), #10 (01.11.21)  - S/P IVA OS #1 (10.01.19), #2 (10.29.19), #3 (11.26.19), #4 (01.02.20), #5 (01.30.20), #6 (02.28.20), #7 (04.16.20), #8 (05.14.20), #9 (06.11.20), #10 (07.09.20), #11 (08.12.20), #12 (01.11.21)  - S/P IVK OD #1 (05.14.20) -- no significant improvement  - switched back to Avastin OD (#9 6.11.2020)  - repeated Eylea4U benefits investigation due to possible IVA resistance (8.12.20)  - S/P IVE OS #1 (09.16.20), #2 (10.16.20), #3 (11.13.20), #4 (12.11.20), #5 (02.08.21), #6 (03.12.21), #7 (04.16.21), #8 (05.14.21), #9 (06.18.21), #10 (07.23.21), #11 (8.30.21), #12 (10.05.21), #13 (11.10.21), #14 (12.15.21), #15 (1.18.22), #16 (03.17.22), #17 (11.14.22), #18 (11.14.22), #19 (02.06.23), #20 (05.08.23),   - S/P IVE OD #1 (09.16.20), #2 (10.16.20), #3 (11.13.20), #4 (12.11.20), #5 (02.08.21), #6 (03.12.21), #7 (04.16.21), #8 (05.14.21), #9 (06.18.21), #10 (07.23.21), #11 (08.30.21), #12 (10.05.21), #13 (11.10.21), #14 (12.15.21), #15 (01.18.22), #16 (03.17.22), #17 (5.12.22), #18 (08.02.22), #19 (11.14.22), #20 (11.14.22), #21 (02.06.23), #22 (05.08.23), #23 (08.02.23)  - OCT today  shows [id number] improvement in central IRF/IRHM at 10 wks since last IVE; OS: Mild interval increase of IRF ST fovea, just trace IRHM remain, just trace IRHM remain at 5 mos since last IVE  - BCVA 20/25 OU -- stable  - recommend IVE OU (OD #24, OS #21) today, 010.12.23 w/ f/u in 10 weeks again  - pt in agreement   - Eylea4U benefits investigation completed and verified for 2023  - RBA of procedure discussed, questions answered   - informed consent obtained  - see procedure note  - Eylea informed consent form re-signed and scanned on 05.08.23  - f/u 10 weeks, DFE, OCT, possible injections  2,3. Hypertensive retinopathy OU  - discussed importance of tight BP control  - continue to monitor  4. Pseudophakia OU  - s/p CE/IOL OU in [place], [place]  - beautiful surgeries, doing well  - continue to monitor  Ophthalmic Meds Ordered this visit:  Meds ordered this encounter  Medications   aflibercept (EYLEA) SOLN 2 mg  aflibercept (EYLEA) SOLN 2 mg     Return in about 10 weeks (around 09/24/2022) for f/u Mod NPDR w/ edema OU, DFE, OCT, Possible, IVE, OU.  There are no Patient Instructions on file for this visit.  This document serves as a record of services personally performed by Gardiner Sleeper, MD, PhD. It was created on their behalf by Orvan Falconer, an ophthalmic technician. The creation of this record is the provider's dictation and/or activities during the visit.    Electronically signed by: Orvan Falconer, OA, 07/16/22  11:56 AM  This document serves as a record of services personally performed by Gardiner Sleeper, MD, PhD. It was created on their behalf by Renaldo Reel, COT an ophthalmic technician. The creation of this record is the provider's dictation and/or activities during the visit.    Electronically signed by:  Renaldo Reel, COT  10.18.23 11:56 AM  Gardiner Sleeper, M.D., Ph.D. Diseases & Surgery of the Retina and Vitreous [facility]  I have reviewed the above documentation for accuracy and completeness, and I agree with the above. Gardiner Sleeper, M.D., Ph.D. 07/16/22 12:01 PM  Abbreviations: M myopia (nearsighted); A astigmatism; H hyperopia (farsighted); P presbyopia; Mrx spectacle prescription;  CTL contact lenses; OD right eye; OS left eye; OU both eyes  XT exotropia; ET esotropia; PEK punctate epithelial keratitis; PEE punctate epithelial erosions; DES dry eye syndrome; MGD meibomian gland dysfunction; ATs artificial tears; PFAT's preservative free artificial tears; [place] nuclear sclerotic cataract; PSC posterior subcapsular cataract; ERM epi-retinal membrane; PVD posterior vitreous detachment; RD retinal detachment; DM diabetes mellitus; DR diabetic retinopathy; NPDR non-proliferative diabetic retinopathy; PDR proliferative diabetic retinopathy; CSME clinically significant macular edema; DME diabetic macular edema; dbh dot blot hemorrhages; CWS cotton wool spot; POAG primary open angle glaucoma; C/D cup-to-disc ratio; HVF humphrey visual field; GVF goldmann visual field; OCT optical coherence tomography; IOP intraocular pressure; BRVO Branch retinal vein occlusion; CRVO central retinal vein occlusion; CRAO central retinal artery occlusion; BRAO branch retinal artery occlusion; RT retinal tear; SB scleral buckle; PPV pars plana vitrectomy; VH Vitreous hemorrhage; PRP panretinal laser photocoagulation; IVK intravitreal kenalog; VMT vitreomacular traction; MH Macular hole;  NVD neovascularization of the disc; NVE neovascularization elsewhere; AREDS age related eye disease study; ARMD age related macular degeneration; POAG primary open angle glaucoma; EBMD epithelial/anterior basement membrane dystrophy; ACIOL anterior chamber intraocular lens; IOL intraocular lens; PCIOL posterior chamber intraocular lens; Phaco/IOL phacoemulsification with intraocular lens placement; [place] photorefractive keratectomy; LASIK laser assisted in situ  keratomileusis; HTN hypertension; DM diabetes mellitus; COPD chronic obstructive pulmonary disease

## 2022-07-11 ENCOUNTER — Ambulatory Visit: Payer: Medicare Other | Admitting: Physical Therapy

## 2022-07-11 DIAGNOSIS — R42 Dizziness and giddiness: Secondary | ICD-10-CM

## 2022-07-11 DIAGNOSIS — R2681 Unsteadiness on feet: Secondary | ICD-10-CM | POA: Diagnosis not present

## 2022-07-11 DIAGNOSIS — R2689 Other abnormalities of gait and mobility: Secondary | ICD-10-CM

## 2022-07-11 NOTE — Therapy (Signed)
OUTPATIENT PHYSICAL THERAPY VESTIBULAR TREATMENT     Patient Name: Justin [name] MRN: [id number] DOB:01-09-1933, 88 y.o., male Today's Date: 07/11/2022  PCP: Willene Hatchet, NP REFERRING PROVIDER: [phone] (ICD-10-CM):  Loura Pardon, MD    PT End of Session - 07/11/22 0803     Visit Number 5    Number of Visits 13    Date for PT Re-Evaluation 07/18/22    Authorization Type [employer] Medicare    Authorization Time Period 06-05-22 - 07-29-22    PT Start Time 0802    PT Stop Time 0845    PT Time Calculation (min) 43 min    Equipment Utilized During Treatment Gait belt    Activity Tolerance Patient tolerated treatment well    Behavior During Therapy WFL for tasks assessed/performed               Past Medical History:  Diagnosis Date   Anxiety    Arthritis    Carotid arterial disease ([place])    CKD (chronic kidney disease)    Coronary artery disease    Diabetes mellitus without complication ([place])    Diabetic retinopathy ([place])    NPDR OU   Diverticulitis    Dyspnea    GERD (gastroesophageal reflux disease)    History of kidney stones 2021   Hypercholesteremia    Hypertension    Hypertensive retinopathy    OU   Myocardial infarction [facility]) 2009   Pneumonia    as a child   Prostate cancer ([place])    UTI (lower urinary tract infection)    Past Surgical History:  Procedure Laterality Date   ABDOMINAL SURGERY     for diverticulitis; also removed appendix   APPENDECTOMY     CARDIAC CATHETERIZATION  2018   CATARACT EXTRACTION     CATARACT EXTRACTION, BILATERAL     CHOLECYSTECTOMY N/A 10/12/2017   Procedure: LAPAROSCOPIC CHOLECYSTECTOMY;  Surgeon: [name], MD;  Location: [place];  Service: General;  Laterality: N/A;   CORONARY ARTERY BYPASS GRAFT  2009   ERCP N/A 12/21/2018   Procedure: ENDOSCOPIC RETROGRADE CHOLANGIOPANCREATOGRAPHY (ERCP);  Surgeon: [name], MD;  Location: [place];  Service: Endoscopy;  Laterality: N/A;   ESOPHAGOGASTRODUODENOSCOPY  (EGD) WITH PROPOFOL N/A 12/17/2018   Procedure: ESOPHAGOGASTRODUODENOSCOPY (EGD) WITH PROPOFOL;  Surgeon: [name], MD;  Location: [place];  Service: Endoscopy;  Laterality: N/A;   EUS Left 12/17/2018   Procedure: UPPER ENDOSCOPIC ULTRASOUND (EUS) LINEAR;  Surgeon: [name], MD;  Location: [place];  Service: Endoscopy;  Laterality: Left;   EYE SURGERY     "for bleeding in eye"   HERNIA REPAIR     LEFT HEART CATH AND CORONARY ANGIOGRAPHY N/A 11/04/2016   Procedure: Left Heart Cath and Coronary Angiography;  Surgeon: [name], MD;  Location: [place] CV LAB;  Service: Cardiovascular;  Laterality: N/A;   LOWER EXTREMITY ANGIOGRAPHY N/A 11/04/2016   Procedure: Lower Extremity Angiography;  Surgeon: [name], MD;  Location: [place] CV LAB;  Service: Cardiovascular;  Laterality: N/A;   LOWER EXTREMITY ANGIOGRAPHY N/A 01/03/2020   Procedure: LOWER EXTREMITY ANGIOGRAPHY;  Surgeon: [name], MD;  Location: [place] CV LAB;  Service: Cardiovascular;  Laterality: N/A;   LOWER EXTREMITY ANGIOGRAPHY Bilateral 01/14/2022   Procedure: Lower Extremity Angiography;  Surgeon: [name], MD;  Location: [place] CV LAB;  Service: Cardiovascular;  Laterality: Bilateral;   LUMBAR LAMINECTOMY/DECOMPRESSION MICRODISCECTOMY Bilateral 11/09/2020   Procedure: Laminectomy and Foraminotomy - bilateral - Lumbar Four-Five.;  Surgeon: Earnie Larsson,  MD;  Location: [place];  Service: Neurosurgery;  Laterality: Bilateral;  posterior   PROSTATE SURGERY     REMOVAL OF STONES  12/21/2018   Procedure: REMOVAL OF STONES;  Surgeon: [name], MD;  Location: [facility] ENDOSCOPY;  Service: Endoscopy;;   SPHINCTEROTOMY  12/21/2018   Procedure: [name];  Surgeon: [name], MD;  Location: [place];  Service: Endoscopy;;   Patient Active Problem List   Diagnosis Date Noted   Near syncope 06/15/2021   Dizziness 06/15/2021   Lumbar stenosis with neurogenic claudication 11/09/2020   CKD stage 3 due to  type 2 diabetes mellitus ([place]) 12/14/2019   Asymptomatic bilateral carotid artery stenosis 12/14/2019   Abdominal distention    CAD (coronary artery disease) 12/14/2018   Acute renal failure superimposed on stage 3 chronic kidney disease ([place]) 12/14/2018   UTI (urinary tract infection) 12/14/2018   HLD (hyperlipidemia) 12/14/2018   Sepsis ([place]) 12/14/2018   Abnormal LFTs    S/P CABG (coronary artery bypass graft) 12/06/2018   Asymptomatic stenosis of right carotid artery 12/06/2018   Claudication in peripheral vascular disease ([place]) 12/06/2018   Orthostatic hypotension 12/06/2018   Elevated liver enzymes 12/06/2018   Abdominal pain 10/12/2018   Abnormal liver function tests 10/12/2018   Foreign body in stomach 10/12/2018   Primary hypertension    GERD (gastroesophageal reflux disease)    Diabetes mellitus without complication ([place])    Atherosclerosis of native coronary artery of native heart without angina pectoris     ONSET DATE: approx. A year ago for onset of symptoms:  Referral date 05-26-22  REFERRING DIAG: R42 (ICD-10-CM) - Dizziness and giddiness   THERAPY DIAG:  Unsteadiness on feet  Other abnormalities of gait and mobility  Dizziness and giddiness  Rationale for Evaluation and Treatment Rehabilitation  SUBJECTIVE:   SUBJECTIVE STATEMENT: Pt reports he is "having a rough day". Pt states he is "dizzy all the time" and is having more pain in his BLEs today.   Pt accompanied by: family member - daughter  PERTINENT HISTORY: CKD stage 3 due to DM type 2:  h/o MI 2009: lumbar stenosis:  orthostatic hypotension:  HTN, h/o near syncope, carotid arterial disease, peripheral neuropathy, diabetic retinopathy   PAIN:  "Usual pain due to the neuropathy" Are you having pain? Yes: NPRS scale: 5-6/10 Pain location: "from his head to his toes" Pain description: burning, tingling, tightness as if a compressive force is on RLE (due to edema) Aggravating factors:  unknown Relieving factors: unknown  PRECAUTIONS: None  WEIGHT BEARING RESTRICTIONS No  FALLS: Has patient fallen in last 6 months? Yes. Number of falls 1  LIVING ENVIRONMENT: Lives with: lives with their family Lives in: House/apartment Has following equipment at home: Single point cane and Environmental consultant - 2 wheeled  PLOF: Independent with basic ADLs and Independent with household mobility with device  PATIENT GOALS "stop being dizzy":   OBJECTIVE: TODAY'S TREATMENT  Ther Ex  SciFit multi-peaks level 2 for 8 minutes using BUE/BLEs for neural priming for reciprocal movement, dynamic cardiovascular warmup and BLE strength. RPE of 8/10 following activity   NMR In // bars for improved vestibular input, ankle strategy and reactive balance strategies: On blue mat:  Fwd and retro gait, starting w/BUE support and progressing to no UE support. Mod verbal cues for increased step clearance as pt sliding feet across mat. Noted asymmetrical stance length, w/LLE>RLE, leading to instability. CGA throughout for safety  Fwd 4" hurdle navigation using step-through pattern, starting w/BUE support and progressing to  no UE support. Pt demonstrated increased difficulty clearing hurdle w/RLE and would brace against // bars to stabilize when progressing LLE due to R hip weakness.  Lat 4" hurdle navigation w/BUE support progressing to no UE support. Min cues for increased step length and to maintain symmetric stance time on each LE. Pt tends to rotate posterolaterally in order to make task easier, so provided pelvic stabilization throughout to reduce rotation.  On rockerboard in A/P direction:  Standing w/EO and no UE support. Pt had significant difficulty maintaining midline orientation and frequently lost balance posteriorly. Noted increased reliance on hip strategy. Min cues for facilitation of ankle strategy and upright posture.  Standing w/horizontal head turns without UE support. Mod A due to posterior lean  throughout despite cues for anterior weight shift  [employer] w/2 therapists (on providing CGA to min A and one throwing ball) for improved lateral reaching, visual tracking and ankle strategy. Pt able to maintain midline orientation w/dual task compared to static balance.  On airex, alt fwd step w/contralateral march and BUE support, x10 reps per side, for improved single leg stance and BLE strength. Pt unable to perform without UE support and had increased stance time on RLE.    GAIT: Gait pattern: step through pattern, decreased arm swing- Left, decreased step length- Left, decreased stance time- Right, decreased stride length, decreased hip/knee flexion- Right, decreased hip/knee flexion- Left, decreased ankle dorsiflexion- Right, decreased ankle dorsiflexion- Left, lateral lean- Right, narrow BOS, poor foot clearance- Right, and poor foot clearance- Left Distance walked: Various clinic distances  Assistive device utilized: Single point cane Level of assistance: Modified independence and SBA Comments: Pt ambulated into/out of clinic w/SPC. Pt required SBA while leaving clinic due to BLE fatigue, demonstrating R lateral gait deviations    Pt rated RPE as 6/10 and pain as 5/10 following session. Stated his dizziness was also reduced.    PATIENT EDUCATION: Education details:  Continue HEP, next appointment time  Person educated: Patient and daughter Education method: Explanation  Education comprehension: verbalized understanding  HEP: Balance exercises- issued for HEP (see PT note on 06/24/22)   GOALS: Goals reviewed with patient? Yes  SHORT TERM GOALS: Target date: 06/26/2022 (3 weeks)   Independent in HEP for balance and vestibular exercises. Baseline: pt reports performing at home.  Goal status: MET  2.  Participate in [place] balance testing.  Baseline: 43/56 Goal status: MET  3.  Improve TUG score to </= 23 secs with SPC with SBA. Baseline: 27.25 secs with SPC; 18.75 seconds  on 06/27/22 Goal status: MET  4.  Pt will subjectively report at least 25% improvement in dizziness Baseline: "yeah I would say so" Goal status: MET   LONG TERM GOALS: Target date: 07/17/2022  (6 weeks)  Improve Berg balance test score by at least 4 points to reduce fall risk. Baseline: 43/56 Goal status: INITIAL  2.  Improve FOTO score DPS from 45/100 to at least 53/100 to demo improvement in dizziness. Baseline: DPS 45/100;  DFS 35.5 Goal status: INITIAL  3.  Improve DVA to </= 2 line difference for improved gaze stabilization. Baseline:  Goal status: INITIAL  4.  Amb. 115' with SPC with intermittent horizontal head turns for environmental scanning without LOB, with SBA.  Baseline:  Goal status: INITIAL  5.  Independent in updated HEP for balance and vestibular exercises.  Baseline:  Goal status: INITIAL   ASSESSMENT:  CLINICAL IMPRESSION: Emphasis of skilled PT session on reactive balance strategies, improved vestibular input  and single leg stance. Pt reported high levels of dizziness at beginning of session that had reduced by end of session. Pt demonstrates decreased stance time/step length w/RLE and increased difficulty on compliant surfaces. Pt exhibits significant posterior lean when standing on rockerboard unless tossing a ball, which facilitated anterior weight shift. Continue POC.    OBJECTIVE IMPAIRMENTS decreased activity tolerance, decreased balance, difficulty walking, dizziness, impaired vision/preception, and pain.   ACTIVITY LIMITATIONS carrying, lifting, bending, standing, squatting, stairs, and locomotion level  PARTICIPATION LIMITATIONS: meal prep, cleaning, laundry, driving, shopping, and community activity  PERSONAL FACTORS Age, Time since onset of injury/illness/exacerbation, and 1-2 comorbidities: orthostatic hypotension and neuropathy  are also affecting patient's functional outcome.   REHAB POTENTIAL: Fair due to chronicity of  deficits  CLINICAL DECISION MAKING: Evolving/moderate complexity  EVALUATION COMPLEXITY: Moderate   PLAN: PT FREQUENCY: 2x/week  PT DURATION: 6 weeks  PLANNED INTERVENTIONS: Therapeutic exercises, Therapeutic activity, Neuromuscular re-education, Balance training, Gait training, Patient/Family education, Self Care, Stair training, and Vestibular training  PLAN FOR NEXT SESSION: any dynamic gait/balance training, balance with SLS, compliant surfaces, etc. - functional strengthening, anterior weight shifting    Kasten Leveque E Euan Wandler, PT, DPT 07/11/2022, 8:57 AM

## 2022-07-14 ENCOUNTER — Encounter (INDEPENDENT_AMBULATORY_CARE_PROVIDER_SITE_OTHER): Payer: Medicare Other | Admitting: Ophthalmology

## 2022-07-15 ENCOUNTER — Encounter: Payer: Self-pay | Admitting: Physical Therapy

## 2022-07-15 ENCOUNTER — Telehealth: Payer: Self-pay

## 2022-07-15 ENCOUNTER — Ambulatory Visit: Payer: Medicare Other | Admitting: Physical Therapy

## 2022-07-15 DIAGNOSIS — R2681 Unsteadiness on feet: Secondary | ICD-10-CM

## 2022-07-15 DIAGNOSIS — R42 Dizziness and giddiness: Secondary | ICD-10-CM

## 2022-07-15 DIAGNOSIS — R2689 Other abnormalities of gait and mobility: Secondary | ICD-10-CM

## 2022-07-15 NOTE — Telephone Encounter (Signed)
Please schedule him for an OV. High risk to do interventions on his arteries at this age

## 2022-07-15 NOTE — Therapy (Signed)
OUTPATIENT PHYSICAL THERAPY VESTIBULAR TREATMENT     Patient Name: Justin [name] MRN: [id number] DOB:08/05/1933, 88 y.o., male Today's Date: 07/15/2022  PCP: Willene Hatchet, NP REFERRING PROVIDER: [phone] (ICD-10-CM):  Loura Pardon, MD    PT End of Session - 07/15/22 2021     Visit Number 6    Number of Visits 13    Date for PT Re-Evaluation 07/25/22   extend a week due to missed appts   Authorization Type [facility] Medicare    Authorization Time Period 06-05-22 - 07-29-22    PT Start Time 0800    PT Stop Time 0845    PT Time Calculation (min) 45 min    Equipment Utilized During Treatment Gait belt    Activity Tolerance Patient tolerated treatment well    Behavior During Therapy WFL for tasks assessed/performed                Past Medical History:  Diagnosis Date   Anxiety    Arthritis    Carotid arterial disease ([place])    CKD (chronic kidney disease)    Coronary artery disease    Diabetes mellitus without complication ([place])    Diabetic retinopathy ([place])    NPDR OU   Diverticulitis    Dyspnea    GERD (gastroesophageal reflux disease)    History of kidney stones 2021   Hypercholesteremia    Hypertension    Hypertensive retinopathy    OU   Myocardial infarction [facility]) 2009   Pneumonia    as a child   Prostate cancer ([place])    UTI (lower urinary tract infection)    Past Surgical History:  Procedure Laterality Date   ABDOMINAL SURGERY     for diverticulitis; also removed appendix   APPENDECTOMY     CARDIAC CATHETERIZATION  2018   CATARACT EXTRACTION     CATARACT EXTRACTION, BILATERAL     CHOLECYSTECTOMY N/A 10/12/2017   Procedure: LAPAROSCOPIC CHOLECYSTECTOMY;  Surgeon: [name], MD;  Location: [place];  Service: General;  Laterality: N/A;   CORONARY ARTERY BYPASS GRAFT  2009   ERCP N/A 12/21/2018   Procedure: ENDOSCOPIC RETROGRADE CHOLANGIOPANCREATOGRAPHY (ERCP);  Surgeon: [name], MD;  Location: [place];  Service: Endoscopy;   Laterality: N/A;   ESOPHAGOGASTRODUODENOSCOPY (EGD) WITH PROPOFOL N/A 12/17/2018   Procedure: ESOPHAGOGASTRODUODENOSCOPY (EGD) WITH PROPOFOL;  Surgeon: [name], MD;  Location: [place];  Service: Endoscopy;  Laterality: N/A;   EUS Left 12/17/2018   Procedure: UPPER ENDOSCOPIC ULTRASOUND (EUS) LINEAR;  Surgeon: [name], MD;  Location: [place];  Service: Endoscopy;  Laterality: Left;   EYE SURGERY     "for bleeding in eye"   HERNIA REPAIR     LEFT HEART CATH AND CORONARY ANGIOGRAPHY N/A 11/04/2016   Procedure: Left Heart Cath and Coronary Angiography;  Surgeon: [name], MD;  Location: [place] CV LAB;  Service: Cardiovascular;  Laterality: N/A;   LOWER EXTREMITY ANGIOGRAPHY N/A 11/04/2016   Procedure: Lower Extremity Angiography;  Surgeon: [name], MD;  Location: [place] CV [facility];  Service: Cardiovascular;  Laterality: N/A;   LOWER EXTREMITY ANGIOGRAPHY N/A 01/03/2020   Procedure: LOWER EXTREMITY ANGIOGRAPHY;  Surgeon: [name], MD;  Location: [place] [facility];  Service: Cardiovascular;  Laterality: N/A;   LOWER EXTREMITY ANGIOGRAPHY Bilateral 01/14/2022   Procedure: Lower Extremity Angiography;  Surgeon: [name], MD;  Location: [place] CV LAB;  Service: Cardiovascular;  Laterality: Bilateral;   LUMBAR LAMINECTOMY/DECOMPRESSION MICRODISCECTOMY Bilateral 11/09/2020   Procedure: Laminectomy and Foraminotomy -  bilateral - Lumbar Four-Five.;  Surgeon: Earnie Larsson, MD;  Location: [place];  Service: Neurosurgery;  Laterality: Bilateral;  posterior   PROSTATE SURGERY     REMOVAL OF STONES  12/21/2018   Procedure: REMOVAL OF STONES;  Surgeon: [name], MD;  Location: [facility] ENDOSCOPY;  Service: Endoscopy;;   SPHINCTEROTOMY  12/21/2018   Procedure: [name];  Surgeon: [name], MD;  Location: [place];  Service: Endoscopy;;   Patient Active Problem List   Diagnosis Date Noted   Near syncope 06/15/2021   Dizziness 06/15/2021   Lumbar stenosis with neurogenic  claudication 11/09/2020   CKD stage 3 due to type 2 diabetes mellitus ([place]) 12/14/2019   Asymptomatic bilateral carotid artery stenosis 12/14/2019   Abdominal distention    CAD (coronary artery disease) 12/14/2018   Acute renal failure superimposed on stage 3 chronic kidney disease ([place]) 12/14/2018   UTI (urinary tract infection) 12/14/2018   HLD (hyperlipidemia) 12/14/2018   Sepsis ([place]) 12/14/2018   Abnormal LFTs    S/P CABG (coronary artery bypass graft) 12/06/2018   Asymptomatic stenosis of right carotid artery 12/06/2018   Claudication in peripheral vascular disease ([place]) 12/06/2018   Orthostatic hypotension 12/06/2018   Elevated liver enzymes 12/06/2018   Abdominal pain 10/12/2018   Abnormal liver function tests 10/12/2018   Foreign body in stomach 10/12/2018   Primary hypertension    GERD (gastroesophageal reflux disease)    Diabetes mellitus without complication ([place])    Atherosclerosis of native coronary artery of native heart without angina pectoris     ONSET DATE: approx. A year ago for onset of symptoms:  Referral date 05-26-22  REFERRING DIAG: R42 (ICD-10-CM) - Dizziness and giddiness   THERAPY DIAG:  Unsteadiness on feet  Other abnormalities of gait and mobility  Dizziness and giddiness  Rationale for Evaluation and Treatment Rehabilitation  SUBJECTIVE:   SUBJECTIVE STATEMENT: Pt reports he is "doing OK"; continues to use cane most of the time in his home; no longer using RW at all.  Daughter states pt liked riding the bike in previous session.   Pt accompanied by: family member - daughter  PERTINENT HISTORY: CKD stage 3 due to DM type 2:  h/o MI 2009: lumbar stenosis:  orthostatic hypotension:  HTN, h/o near syncope, carotid arterial disease, peripheral neuropathy, diabetic retinopathy   PAIN:  "Usual pain due to the neuropathy" Are you having pain? Yes: NPRS scale: 5-6/10 Pain location: "from his head to his toes" Pain description: burning, tingling,  tightness as if a compressive force is on RLE (due to edema) Aggravating factors: unknown Relieving factors: unknown  PRECAUTIONS: None  WEIGHT BEARING RESTRICTIONS No  FALLS: Has patient fallen in last 6 months? Yes. Number of falls 1  LIVING ENVIRONMENT: Lives with: lives with their family Lives in: House/apartment Has following equipment at home: Single point cane and Environmental consultant - 2 wheeled  PLOF: Independent with basic ADLs and Independent with household mobility with device  PATIENT GOALS "stop being dizzy":    OBJECTIVE: TODAY'S TREATMENT ;  07-15-22  Ther Ex  Runner's stretch bil. LE's 20 sec hold each leg x 1 rep;   Gastroc stretch with heels off edge of step - 20 sec hold x 1 rep with min assist to prevent LOB posteriorly Heel raises 10 reps bil. LE's SciFit  level 3 for 6" with bil. UE's and LE's for bil. LE strengthening  NeuroRe-ed;  Tap ups to 1st step - 6" - 5 reps each foot alternating; tap ups to 2nd  step 5 reps each foot with CGA to min assist for balance recovery without UE support  Rockerboard - inside // bars - 10 reps with bil. UE support; 10 reps with 2 finger support on each hand; 10 reps without UE support with min assist Stepping over/back of balance beam 5 reps each leg; 5 reps sideways with UE support on // bars prn with CGA  Pt performed static standing balance on blue mat - stepping forward/back 5 reps each leg unilaterally; stepping laterally 5 reps each leg; stepping back/up 5 reps each leg with mod to min assist; marching in place 10 reps each leg on blue mat with min to mod assist  Pt performed backwards amb. 20' x 1 rep with min assist for balance  Pt performed cone taps standing on floor - 2 reps each foot to 3 cones with CGA to min assist;  progressed to standing on blue mat - 4 cones used - cone taps with min to mod assist with balance   Pt stood on Airex by side of mat - played Zoomball with daughter's assist - min assist for  balance/safety     GAIT: Gait pattern: step through pattern, decreased arm swing- Left, decreased step length- Left, decreased stance time- Right, decreased stride length, decreased hip/knee flexion- Right, decreased hip/knee flexion- Left, decreased ankle dorsiflexion- Right, decreased ankle dorsiflexion- Left, lateral lean- Right, narrow BOS, poor foot clearance- Right, and poor foot clearance- Left Distance walked: 24' with cues to increase speed and to look straight ahead rather than looking down Assistive device utilized: No device used Level of assistance: CGA  Comments: Pt ambulated into/out of clinic w/SPC       PATIENT EDUCATION: Education details:  Continue HEP, next appointment time  Person educated: Patient and daughter Education method: Explanation  Education comprehension: verbalized understanding  HEP: Balance exercises- issued for HEP (see PT note on 06/24/22)   GOALS: Goals reviewed with patient? Yes  SHORT TERM GOALS: Target date: 06/26/2022 (3 weeks)   Independent in HEP for balance and vestibular exercises. Baseline: pt reports performing at home.  Goal status: MET  2.  Participate in [place] balance testing.  Baseline: 43/56 Goal status: MET  3.  Improve TUG score to </= 23 secs with SPC with SBA. Baseline: 27.25 secs with SPC; 18.75 seconds on 06/27/22 Goal status: MET  4.  Pt will subjectively report at least 25% improvement in dizziness Baseline: "yeah I would say so" Goal status: MET   LONG TERM GOALS: Target date: 07/25/2022  (6 weeks)  Improve Berg balance test score by at least 4 points to reduce fall risk. Baseline: 43/56 Goal status: INITIAL  2.  Improve FOTO score DPS from 45/100 to at least 53/100 to demo improvement in dizziness. Baseline: DPS 45/100;  DFS 35.5 Goal status: INITIAL  3.  Improve DVA to </= 2 line difference for improved gaze stabilization. Baseline:  Goal status: INITIAL  4.  Amb. 115' with SPC with intermittent  horizontal head turns for environmental scanning without LOB, with SBA.  Baseline:  Goal status: INITIAL  5.  Independent in updated HEP for balance and vestibular exercises.  Baseline:  Goal status: INITIAL   ASSESSMENT:  CLINICAL IMPRESSION:  PT session focused on balance training on compliant and noncompliant surfaces for increased vestibular input.  Pt requires mod assist to prevent LOB due to leaning posteriorly with SLS activities with standing on compliant surfaces.  Pt appears to equate "dizziness" with dysequilibrium which appears to be  largely resultant of peripheral neuropathy in feet.  Pt reported fatigue in bil. Legs at end of session.  Progressing well towards LTG's.  Continue POC.    OBJECTIVE IMPAIRMENTS decreased activity tolerance, decreased balance, difficulty walking, dizziness, impaired vision/preception, and pain.   ACTIVITY LIMITATIONS carrying, lifting, bending, standing, squatting, stairs, and locomotion level  PARTICIPATION LIMITATIONS: meal prep, cleaning, laundry, driving, shopping, and community activity  PERSONAL FACTORS Age, Time since onset of injury/illness/exacerbation, and 1-2 comorbidities: orthostatic hypotension and neuropathy  are also affecting patient's functional outcome.   REHAB POTENTIAL: Fair due to chronicity of deficits  CLINICAL DECISION MAKING: Evolving/moderate complexity  EVALUATION COMPLEXITY: Moderate   PLAN: PT FREQUENCY: 2x/week  PT DURATION: 6 weeks  PLANNED INTERVENTIONS: Therapeutic exercises, Therapeutic activity, Neuromuscular re-education, Balance training, Gait training, Patient/Family education, Self Care, Stair training, and Vestibular training  PLAN FOR NEXT SESSION: Dynamic gait/balance training, balance with SLS, compliant surfaces, etc. - functional strengthening, anterior weight shifting: D/C planned 07-24-22    Alda Lea, PT 07/15/2022, 8:45 PM

## 2022-07-15 NOTE — Telephone Encounter (Signed)
Spoke with patient's daughter today. She states patient's CAT scan with GI doctor showed "blocked vessels in patient's stomach". Daughter mentioned GI doctor was "stumped" and wanted to follow up with cardiology.   Daughter also inquired about cilostazol, patient is still experiencing diarrhea. Please advise.   Thanks [employer]

## 2022-07-16 ENCOUNTER — Encounter (INDEPENDENT_AMBULATORY_CARE_PROVIDER_SITE_OTHER): Payer: Self-pay | Admitting: Ophthalmology

## 2022-07-16 ENCOUNTER — Ambulatory Visit (INDEPENDENT_AMBULATORY_CARE_PROVIDER_SITE_OTHER): Payer: Medicare Other | Admitting: Ophthalmology

## 2022-07-16 DIAGNOSIS — Z961 Presence of intraocular lens: Secondary | ICD-10-CM

## 2022-07-16 DIAGNOSIS — I10 Essential (primary) hypertension: Secondary | ICD-10-CM | POA: Diagnosis not present

## 2022-07-16 DIAGNOSIS — E113313 Type 2 diabetes mellitus with moderate nonproliferative diabetic retinopathy with macular edema, bilateral: Secondary | ICD-10-CM | POA: Diagnosis not present

## 2022-07-16 DIAGNOSIS — H35033 Hypertensive retinopathy, bilateral: Secondary | ICD-10-CM | POA: Diagnosis not present

## 2022-07-16 MED ORDER — AFLIBERCEPT 2MG/0.05ML IZ SOLN FOR KALEIDOSCOPE
2.0000 mg | INTRAVITREAL | Status: AC | PRN
  Administered 2022-07-16: 2 mg via INTRAVITREAL

## 2022-07-17 NOTE — Telephone Encounter (Signed)
Can you please call patient and schedule an OV?

## 2022-07-18 ENCOUNTER — Ambulatory Visit: Payer: Medicare Other

## 2022-07-18 DIAGNOSIS — R42 Dizziness and giddiness: Secondary | ICD-10-CM

## 2022-07-18 DIAGNOSIS — R2681 Unsteadiness on feet: Secondary | ICD-10-CM | POA: Diagnosis not present

## 2022-07-18 DIAGNOSIS — R2689 Other abnormalities of gait and mobility: Secondary | ICD-10-CM

## 2022-07-18 NOTE — Therapy (Signed)
OUTPATIENT PHYSICAL THERAPY VESTIBULAR TREATMENT     Patient Name: Justin [name] MRN: [id number] DOB:1932-11-19, 88 y.o., male Today's Date: 07/18/2022  PCP: Willene Hatchet, NP REFERRING PROVIDER: [phone] (ICD-10-CM):  Loura Pardon, MD    PT End of Session - 07/18/22 0753     Visit Number 7    Number of Visits 13    Date for PT Re-Evaluation 07/25/22    Authorization Type [employer] Medicare    Authorization Time Period 06-05-22 - 07-29-22    PT Start Time 0800    PT Stop Time 0843    PT Time Calculation (min) 43 min    Equipment Utilized During Treatment Gait belt    Activity Tolerance Patient tolerated treatment well    Behavior During Therapy WFL for tasks assessed/performed             Past Medical History:  Diagnosis Date   Anxiety    Arthritis    Carotid arterial disease ([place])    CKD (chronic kidney disease)    Coronary artery disease    Diabetes mellitus without complication ([place])    Diabetic retinopathy ([place])    NPDR OU   Diverticulitis    Dyspnea    GERD (gastroesophageal reflux disease)    History of kidney stones 2021   Hypercholesteremia    Hypertension    Hypertensive retinopathy    OU   Myocardial infarction [facility]) 2009   Pneumonia    as a child   Prostate cancer ([place])    UTI (lower urinary tract infection)    Past Surgical History:  Procedure Laterality Date   ABDOMINAL SURGERY     for diverticulitis; also removed appendix   APPENDECTOMY     CARDIAC CATHETERIZATION  2018   CATARACT EXTRACTION     CATARACT EXTRACTION, BILATERAL     CHOLECYSTECTOMY N/A 10/12/2017   Procedure: LAPAROSCOPIC CHOLECYSTECTOMY;  Surgeon: [name], MD;  Location: [place];  Service: General;  Laterality: N/A;   CORONARY ARTERY BYPASS GRAFT  2009   ERCP N/A 12/21/2018   Procedure: ENDOSCOPIC RETROGRADE CHOLANGIOPANCREATOGRAPHY (ERCP);  Surgeon: [name], MD;  Location: [place];  Service: Endoscopy;  Laterality: N/A;   ESOPHAGOGASTRODUODENOSCOPY  (EGD) WITH PROPOFOL N/A 12/17/2018   Procedure: ESOPHAGOGASTRODUODENOSCOPY (EGD) WITH PROPOFOL;  Surgeon: [name], MD;  Location: [place];  Service: Endoscopy;  Laterality: N/A;   EUS Left 12/17/2018   Procedure: UPPER ENDOSCOPIC ULTRASOUND (EUS) LINEAR;  Surgeon: [name], MD;  Location: [place];  Service: Endoscopy;  Laterality: Left;   EYE SURGERY     "for bleeding in eye"   HERNIA REPAIR     LEFT HEART CATH AND CORONARY ANGIOGRAPHY N/A 11/04/2016   Procedure: Left Heart Cath and Coronary Angiography;  Surgeon: [name], MD;  Location: [place] [facility];  Service: Cardiovascular;  Laterality: N/A;   LOWER EXTREMITY ANGIOGRAPHY N/A 11/04/2016   Procedure: Lower Extremity Angiography;  Surgeon: [name], MD;  Location: [place] [facility];  Service: Cardiovascular;  Laterality: N/A;   LOWER EXTREMITY ANGIOGRAPHY N/A 01/03/2020   Procedure: LOWER EXTREMITY ANGIOGRAPHY;  Surgeon: [name], MD;  Location: [place] [facility];  Service: Cardiovascular;  Laterality: N/A;   LOWER EXTREMITY ANGIOGRAPHY Bilateral 01/14/2022   Procedure: Lower Extremity Angiography;  Surgeon: [name], MD;  Location: [place] [facility];  Service: Cardiovascular;  Laterality: Bilateral;   LUMBAR LAMINECTOMY/DECOMPRESSION MICRODISCECTOMY Bilateral 11/09/2020   Procedure: Laminectomy and Foraminotomy - bilateral - Lumbar Four-Five.;  Surgeon: Earnie Larsson, MD;  Location: [place] [facility];  Service: Neurosurgery;  Laterality: Bilateral;  posterior   PROSTATE SURGERY     REMOVAL OF STONES  12/21/2018   Procedure: REMOVAL OF STONES;  Surgeon: [name], MD;  Location: [facility];  Service: Endoscopy;;   SPHINCTEROTOMY  12/21/2018   Procedure: [name];  Surgeon: [name], MD;  Location: [place];  Service: Endoscopy;;   Patient Active Problem List   Diagnosis Date Noted   Near syncope 06/15/2021   Dizziness 06/15/2021   Lumbar stenosis with neurogenic claudication 11/09/2020   CKD stage 3 due to  type 2 diabetes mellitus ([place]) 12/14/2019   Asymptomatic bilateral carotid artery stenosis 12/14/2019   Abdominal distention    CAD (coronary artery disease) 12/14/2018   Acute renal failure superimposed on stage 3 chronic kidney disease ([place]) 12/14/2018   UTI (urinary tract infection) 12/14/2018   HLD (hyperlipidemia) 12/14/2018   Sepsis ([place]) 12/14/2018   Abnormal LFTs    S/P CABG (coronary artery bypass graft) 12/06/2018   Asymptomatic stenosis of right carotid artery 12/06/2018   Claudication in peripheral vascular disease ([place]) 12/06/2018   Orthostatic hypotension 12/06/2018   Elevated liver enzymes 12/06/2018   Abdominal pain 10/12/2018   Abnormal liver function tests 10/12/2018   Foreign body in stomach 10/12/2018   Primary hypertension    GERD (gastroesophageal reflux disease)    Diabetes mellitus without complication ([place])    Atherosclerosis of native coronary artery of native heart without angina pectoris     ONSET DATE: approx. A year ago for onset of symptoms:  Referral date 05-26-22  REFERRING DIAG: R42 (ICD-10-CM) - Dizziness and giddiness   THERAPY DIAG:  Unsteadiness on feet  Other abnormalities of gait and mobility  Dizziness and giddiness  Rationale for Evaluation and Treatment Rehabilitation  SUBJECTIVE:   SUBJECTIVE STATEMENT: Patient reports doing well. No falls/near falls. Patient reports using cane in the house and in the community.   Pt accompanied by: family member - daughter  PERTINENT HISTORY: CKD stage 3 due to DM type 2:  h/o MI 2009: lumbar stenosis:  orthostatic hypotension:  HTN, h/o near syncope, carotid arterial disease, peripheral neuropathy, diabetic retinopathy   PAIN:  "Usual pain due to the neuropathy" Are you having pain? Yes: NPRS scale: 5-6/10 Pain location: "from his head to his toes" Pain description: burning, tingling, tightness as if a compressive force is on RLE (due to edema) Aggravating factors: unknown Relieving  factors: unknown  PRECAUTIONS: None  WEIGHT BEARING RESTRICTIONS No  FALLS: Has patient fallen in last 6 months? Yes. Number of falls 1  LIVING ENVIRONMENT: Lives with: lives with their family Lives in: House/apartment Has following equipment at home: Single point cane and Environmental consultant - 2 wheeled  PLOF: Independent with basic ADLs and Independent with household mobility with device  PATIENT GOALS "stop being dizzy":    OBJECTIVE: TODAY'S TREATMENT ;  07-15-22  Ther Ex  -scifit hills level 2 x10 mins BUE/LE for improved LE strength  NeuroRe-ed; -standing with toes on wedge to promote anterior weight shift completing fine motor manipulation reaching outside BOS (10 clothes pins x4)  -standing facing down ramp tapping toes to cones 2x10 alternating feet  -standing facing up ramp tapping toes to cones 2x10 alternating feet  -anterior step over hurdle standing on blue mat in // bars B LE x12  -lateral step over hurdle standing on blue mat in // bars B LE x12 -forward walking over 4" hurdles on blue mat in // bars x4 laps  -  immediate standing balance sit <> stand on Airex 2x6 with CGA provided  PATIENT EDUCATION: Education details:  Continue HEP Person educated: Patient and daughter Education method: Explanation  Education comprehension: verbalized understanding  HEP: Balance exercises- issued for HEP (see PT note on 06/24/22)   GOALS: Goals reviewed with patient? Yes  SHORT TERM GOALS: Target date: 06/26/2022 (3 weeks)   Independent in HEP for balance and vestibular exercises. Baseline: pt reports performing at home.  Goal status: MET  2.  Participate in [place] balance testing.  Baseline: 43/56 Goal status: MET  3.  Improve TUG score to </= 23 secs with SPC with SBA. Baseline: 27.25 secs with SPC; 18.75 seconds on 06/27/22 Goal status: MET  4.  Pt will subjectively report at least 25% improvement in dizziness Baseline: "yeah I would say so" Goal status: MET   LONG  TERM GOALS: Target date: 07/25/2022  (6 weeks)  Improve Berg balance test score by at least 4 points to reduce fall risk. Baseline: 43/56 Goal status: INITIAL  2.  Improve FOTO score DPS from 45/100 to at least 53/100 to demo improvement in dizziness. Baseline: DPS 45/100;  DFS 35.5 Goal status: INITIAL  3.  Improve DVA to </= 2 line difference for improved gaze stabilization. Baseline:  Goal status: INITIAL  4.  Amb. 115' with SPC with intermittent horizontal head turns for environmental scanning without LOB, with SBA.  Baseline:  Goal status: INITIAL  5.  Independent in updated HEP for balance and vestibular exercises.  Baseline:  Goal status: INITIAL   ASSESSMENT:  CLINICAL IMPRESSION: Patient seen for skilled PT session with emphasis on balance retraining. Patient tolerating exercises well with tendency toward posterior LOB. Demonstrated ability to complete anterior/backward and lateral stepping in prep for stepping strategy re-training. Continue POC.    OBJECTIVE IMPAIRMENTS decreased activity tolerance, decreased balance, difficulty walking, dizziness, impaired vision/preception, and pain.   ACTIVITY LIMITATIONS carrying, lifting, bending, standing, squatting, stairs, and locomotion level  PARTICIPATION LIMITATIONS: meal prep, cleaning, laundry, driving, shopping, and community activity  PERSONAL FACTORS Age, Time since onset of injury/illness/exacerbation, and 1-2 comorbidities: orthostatic hypotension and neuropathy  are also affecting patient's functional outcome.   REHAB POTENTIAL: Fair due to chronicity of deficits  CLINICAL DECISION MAKING: Evolving/moderate complexity  EVALUATION COMPLEXITY: Moderate   PLAN: PT FREQUENCY: 2x/week  PT DURATION: 6 weeks  PLANNED INTERVENTIONS: Therapeutic exercises, Therapeutic activity, Neuromuscular re-education, Balance training, Gait training, Patient/Family education, Self Care, Stair training, and Vestibular  training  PLAN FOR NEXT SESSION: Dynamic gait/balance training, balance with SLS, compliant surfaces, etc. - functional strengthening, anterior weight shifting: D/C planned 07-24-22    Debbora Dus, PT Debbora Dus, PT, DPT, CBIS  07/18/2022, 8:44 AM

## 2022-07-22 ENCOUNTER — Ambulatory Visit: Payer: Medicare Other | Admitting: Physical Therapy

## 2022-07-22 DIAGNOSIS — R42 Dizziness and giddiness: Secondary | ICD-10-CM

## 2022-07-22 DIAGNOSIS — R2681 Unsteadiness on feet: Secondary | ICD-10-CM

## 2022-07-22 DIAGNOSIS — R2689 Other abnormalities of gait and mobility: Secondary | ICD-10-CM

## 2022-07-23 ENCOUNTER — Encounter: Payer: Self-pay | Admitting: Physical Therapy

## 2022-07-23 ENCOUNTER — Ambulatory Visit: Payer: Medicare Other | Admitting: Cardiology

## 2022-07-23 ENCOUNTER — Encounter: Payer: Self-pay | Admitting: Cardiology

## 2022-07-23 ENCOUNTER — Ambulatory Visit: Payer: Medicare Other

## 2022-07-23 VITALS — BP 91/47 | HR 74 | Temp 98.3°F | Resp 16 | Ht 67.0 in | Wt 162.8 lb

## 2022-07-23 DIAGNOSIS — K551 Chronic vascular disorders of intestine: Secondary | ICD-10-CM

## 2022-07-23 DIAGNOSIS — R42 Dizziness and giddiness: Secondary | ICD-10-CM

## 2022-07-23 DIAGNOSIS — I6521 Occlusion and stenosis of right carotid artery: Secondary | ICD-10-CM

## 2022-07-23 DIAGNOSIS — I739 Peripheral vascular disease, unspecified: Secondary | ICD-10-CM

## 2022-07-23 DIAGNOSIS — K909 Intestinal malabsorption, unspecified: Secondary | ICD-10-CM

## 2022-07-23 DIAGNOSIS — I10 Essential (primary) hypertension: Secondary | ICD-10-CM

## 2022-07-23 MED ORDER — COLESTIPOL HCL 5 G PO GRAN: 5.0000 g | GRANULES | Freq: Two times a day (BID) | ORAL | 12 refills | Status: DC

## 2022-07-23 MED ORDER — HYDRALAZINE HCL 25 MG PO TABS: 25.0000 mg | ORAL_TABLET | Freq: Three times a day (TID) | ORAL | 2 refills | Status: DC

## 2022-07-23 MED ORDER — LABETALOL HCL 100 MG PO TABS: 100.0000 mg | ORAL_TABLET | Freq: Two times a day (BID) | ORAL | 1 refills | Status: DC

## 2022-07-23 NOTE — Progress Notes (Signed)
Primary Physician:  Willene Hatchet, NP   Patient ID: Jonetta Osgood, male    DOB: Mar 12, 1933, 88 y.o.   MRN: [id number]  Chief Complaint  Patient presents with   Hypertension   Follow-up    3 months   HPI:    NADIA TORR  is a 88 y.o. male with history of CAD, S/P CABG in 2009, small vessel PAD, pseudoclaudication, type II diabetes with autonomic insufficiency and autonomic orthostatic hypotension, stage IIIa chronic kidney disease, primary hypertension, asymptomatic carotid stenosis, and hyperlipidemia.  He has patent graft except occluded SVG to RCA which is diffusely diseased small vessel and also severe below-knee bilateral lower disease by angiography on 11/04/2016 and mild small vessel peripheral arterial disease especially in the left leg angiography in April 2023.  Patient has chronic dizziness but over the past few weeks it is gotten worse.  Patient has lost about 20 pounds in weight.  He underwent CT angiogram of the abdomen which revealed severe mesenteric stenosis and hence was referred back to me.  On further questioning, patient has not had any abdominal discomfort.  GI evaluation including colonoscopy and endoscopy did not reveal any ischemic changes except mild diverticulosis.  Symptoms of claudication remained stable.  He is accompanied by his daughter. Past Medical History:  Diagnosis Date   Anxiety    Arthritis    Carotid arterial disease ([place])    CKD (chronic kidney disease)    Coronary artery disease    Diabetes mellitus without complication (HCC)    Diabetic retinopathy ([place])    NPDR OU   Diverticulitis    Dyspnea    GERD (gastroesophageal reflux disease)    History of kidney stones 2021   Hypercholesteremia    Hypertension    Hypertensive retinopathy    OU   Myocardial infarction [facility]) 2009   Pneumonia    as a child   Prostate cancer ([place])    UTI (lower urinary tract infection)    Social History   Tobacco Use   Smoking status: Former     Packs/day: 0.25    Years: 2.00    Total pack years: 0.50    Types: Cigarettes   Smokeless tobacco: Never   Tobacco comments:    when he was a teenage age [age]  Substance Use Topics   Alcohol use: No    Marital Status: Widowed  ROS   Review of Systems  Cardiovascular:  Positive for dyspnea on exertion. Negative for chest pain, claudication and leg swelling.  Musculoskeletal:  Positive for arthritis and back pain.  Gastrointestinal:  Positive for diarrhea.   Objective  Blood pressure (!) 91/47, pulse 74, temperature 98.3 F (36.8 C), temperature source Temporal, resp. rate 16, height '5\' 7"'  (1.702 m), weight 162 lb 12.8 oz (73.8 kg), SpO2 99 %. Body mass index is 25.5 kg/m.     07/23/2022   10:00 AM 07/23/2022    9:59 AM 07/23/2022    9:58 AM  Vitals with BMI  Height '5\' 7"'     Weight 162 lbs 13 oz    BMI 68.34    Systolic 91 196 222  Diastolic 47 59 69  Pulse 74 72 65   Physical Exam Vitals reviewed.  Constitutional:      Appearance: He is well-developed.  Neck:     Vascular: No JVD.  Cardiovascular:     Rate and Rhythm: Normal rate and regular rhythm.     Pulses: Intact distal pulses.  Carotid pulses are  on the right side with bruit and  on the left side with bruit.      Femoral pulses are 2+ on the right side and 2+ on the left side.      Popliteal pulses are 1+ on the right side and 1+ on the left side.       Dorsalis pedis pulses are 0 on the right side and 0 on the left side.       Posterior tibial pulses are 0 on the right side and 0 on the left side.     Heart sounds: Normal heart sounds, S1 normal and S2 normal. No murmur heard.    No gallop.  Pulmonary:     Effort: Pulmonary effort is normal. No accessory muscle usage.     Breath sounds: Normal breath sounds.  Abdominal:     General: Bowel sounds are normal.     Palpations: Abdomen is soft.  Musculoskeletal:     Right lower leg: Edema (trace ankle) present.     Left lower leg: Edema (trace  ankle) present.   Laboratory examination:      Latest Ref Rng & Units 07/03/2022    9:30 AM 01/02/2022   10:18 AM 06/16/2021   12:57 AM  CMP  Glucose 70 - 99 mg/dL  140  174   BUN 8 - 27 mg/dL  28  29   Creatinine 0.61 - 1.24 mg/dL 1.20  1.42  1.56   Sodium 134 - 144 mmol/L  140  137   Potassium 3.5 - 5.2 mmol/L  5.0  4.3   Chloride 96 - 106 mmol/L  109  108   CO2 20 - 29 mmol/L  18  20   Calcium 8.6 - 10.2 mg/dL  9.5  9.1   Total Protein 6.5 - 8.1 g/dL   6.0   Total Bilirubin 0.3 - 1.2 mg/dL   0.5   Alkaline Phos 38 - 126 U/L   50   AST 15 - 41 U/L   17   ALT 0 - 44 U/L   14       Latest Ref Rng & Units 01/02/2022   10:18 AM 06/16/2021   12:57 AM 06/14/2021    1:14 PM  CBC  WBC 3.4 - 10.8 x10E3/uL 6.7  9.1  8.6   Hemoglobin 13.0 - 17.7 g/dL 10.5  11.3  11.0   Hematocrit 37.5 - 51.0 % 32.0  34.0  34.5   Platelets 150 - 450 x10E3/uL 177  187  198    Lipid Panel     Component Value Date/Time   CHOL 130 03/14/2021 0832   TRIG 104 03/14/2021 0832   HDL 49 03/14/2021 0832   LDLCALC 62 03/14/2021 0832   HEMOGLOBIN A1C Lab Results  Component Value Date   HGBA1C 7.2 (H) 11/06/2020   MPG 159.94 11/06/2020   TSH No results for input(s): "TSH" in the last 8760 hours.    External labs:  01/31/2022: Total cholesterol 143, HDL 59, LDL 70, triglycerides 61  BUN 33, creatinine 1.43, GFR 48, sodium 141, potassium 5.0  Hgb 10.4, HCT 32.5, MCV 93.7, platelet 187  TSH 0.99  BNP 165  Vitamin D 36  Allergies   Allergies  Allergen Reactions   Contrast Media [Iodinated Contrast Media] Itching    PT STATES ALLERGY TO "CT DYE".   Pletal [Cilostazol] Diarrhea   Amlodipine Swelling    Leg swelling   Dye Fdc Red [Red Dye] Swelling  CAT scan dye   Ibuprofen Other (See Comments)    Upset stomach      Final Medications at End of Visit     Current Outpatient Medications:    ACCU-CHEK SMARTVIEW test strip, , Disp: , Rfl:    Accu-Chek Softclix Lancets lancets, , Disp: ,  Rfl:    acetaminophen (TYLENOL) 500 MG tablet, Take 1,000 mg by mouth 2 (two) times daily as needed for mild pain., Disp: , Rfl:    Blood Glucose Monitoring Suppl (ACCU-CHEK GUIDE) w/Device KIT, , Disp: , Rfl:    cholecalciferol (VITAMIN D3) 25 MCG (1000 UNIT) tablet, Take 1,000 Units by mouth daily., Disp: , Rfl:    clonazePAM (KLONOPIN) 0.5 MG tablet, Take 0.5 mg by mouth daily as needed for anxiety., Disp: , Rfl:    colestipol (COLESTID) 5 g granules, Take 5 g by mouth 2 (two) times daily., Disp: 500 g, Rfl: 12   docusate sodium (COLACE) 50 MG capsule, Take 50 mg by mouth 2 (two) times daily., Disp: , Rfl:    DULoxetine (CYMBALTA) 30 MG capsule, Take 2 capsules (60 mg total) by mouth daily., Disp: 90 capsule, Rfl: 3   ezetimibe (ZETIA) 10 MG tablet, TAKE 1 TABLET DAILY AFTER SUPPER (Patient taking differently: Take 10 mg by mouth every evening.), Disp: 90 tablet, Rfl: 3   hydrALAZINE (APRESOLINE) 25 MG tablet, Take 1 tablet (25 mg total) by mouth 3 (three) times daily., Disp: 90 tablet, Rfl: 2   isosorbide dinitrate (ISORDIL) 30 MG tablet, Take 1 tablet (30 mg total) by mouth 3 (three) times daily., Disp: 270 tablet, Rfl: 3   LANTUS SOLOSTAR 100 UNIT/ML Solostar Pen, Inject 10 Units into the skin 2 (two) times daily. Inject 10 units in the morning and 10 units in the evening., Disp: , Rfl:    linaclotide (LINZESS) 145 MCG CAPS capsule, Take 145 mcg by mouth daily as needed (constipation)., Disp: , Rfl:    losartan (COZAAR) 50 MG tablet, Take 1 tablet (50 mg total) by mouth daily., Disp: 90 tablet, Rfl: 3   omeprazole (PRILOSEC) 40 MG capsule, Take 40 mg by mouth daily. , Disp: , Rfl:    Pyridoxine HCl (B-6 PO), Take 1 capsule by mouth daily., Disp: , Rfl:    simvastatin (ZOCOR) 20 MG tablet, Take 20 mg by mouth at bedtime., Disp: , Rfl:    tamsulosin (FLOMAX) 0.4 MG CAPS capsule, Take 1 capsule (0.4 mg total) by mouth daily after supper., Disp: 90 capsule, Rfl: 3   vitamin B-12 (CYANOCOBALAMIN)  500 MCG tablet, Take 500 mcg by mouth daily., Disp: , Rfl:    Vitamin E 268 MG (400 UNIT) CAPS, Take 400 Units by mouth daily., Disp: , Rfl:    labetalol (NORMODYNE) 100 MG tablet, Take 1 tablet (100 mg total) by mouth 2 (two) times daily., Disp: 60 tablet, Rfl: 1  Radiology:   CT Abdomen 07/03/2022: 1. High-grade stenosis of the superior mesenteric artery due to a combination of hard and soft plaque. There is also substantial atheromatous narrowing of both renal arteries proximally, likely with high-grade stenosis. There is also moderate atheromatous narrowing at the origin of the celiac trunk secondary to calcified atheromatous plaque. The inferior mesenteric artery is also heavily calcified but appears to opacify distal to this.   If the patient's weight loss is also associated with postprandial abdominal pain, possibility of chronic mesenteric ischemia might be considered given the degree of atheromatous disease, although there is no overt vessel occlusion currently identified.  Cardiac Studies:   Coronary angiogram 11/04/2016: Native RCA small and diffusely diseased with distal 70-80% stenosis. Occluded SVG to RCA. Patent SVG to OM-RI, LIMA to LAD. Medical therapy.  Lower Extremity Arterial Duplex 11/30/2019:  No hemodynamically significant stenosis in bilateral lower extremity above  the knee.  Moderate velocity increase at the right mid posterior tibial artery.  suggests >50%stenosis.  Diffuse small vessel disease with moderately abnormal waveform right ankle  and severely abnormal waveform left ankle.  Non compressible vessels bilateral at the ankles suggests medial  calcinosis.    No significant change from 05/02/2019.  PCV MYOCARDIAL PERFUSION WITH LEXISCAN 09/24/2020 Lexiscan nuclear stress test performed using 1-day protocol. SPECT images showed small sized, mild intensity, mildly reversible perfusion defect in apical to basal inferior myocardium. Stress LVEF 53%. Low risk  study.  ABI 02/11/2021: Right PT monophasic and non-compressible, DP biphasic and noncompressible.  TBI 0.59.  Right great toe pressure 102 mmHg. Left PT monophasic and noncompressible, DP monophasic and noncompressible.  TBI 0.42.  Left great toe pressure 73 mmHg. Bilateral TBI abnormal. Bilateral TBI suggest appropriate pressure for wound healing.   Patient has had peripheral arteriogram on 01/03/2020 revealing widely patent large vessels and small vessel disease.  In the absence of open wounds or nonhealing foot ulcer and absence of rest pain, recommend medical therapy  Echocardiogram 06/15/2021: 1. Left ventricular ejection fraction, by estimation, is 60 to 65%. The  left ventricle has normal function. The left ventricle has no regional  wall motion abnormalities. There is severe left ventricular hypertrophy.  Left ventricular diastolic parameters  are consistent with Grade I diastolic dysfunction (impaired relaxation).   2. Right ventricular systolic function is normal. The right ventricular size is normal.   3. Left atrial size was mildly dilated.   4. The mitral valve is abnormal. Mild to moderate mitral valve regurgitation.   5. The aortic valve is tricuspid. Mild aortic valve sclerosis is present, with no evidence of  aortic valve stenosis.   6. The inferior vena cava is normal in size with greater than 50%  respiratory variability, suggesting right atrial pressure of 3 mmHg.   Comparison(s): No significant change from prior study. 01/11/2021: LVEF  60-65%, moderate LVH, grade 1 DD, moderate MR.   Carotid artery duplex 12/24/2021:  Duplex suggests stenosis in the right internal carotid artery (50-69%),  upper limit of the spectrum.  Duplex suggests stenosis in the left internal carotid artery (1-15%).  Duplex suggests stenosis in the left external carotid artery (<50%).  Antegrade right vertebral artery flow. Antegrade left vertebral artery flow.  Compared to 05/14/2021, mild  regression of disease severity in the right  carotid artery from > 70%. Follow up in six months is appropriate if clinically indicated.  Peripheral arteriogram 01/14/2022: Abdominal aortogram reveals presence of 2 renal arteries 1 on either sides.  Widely patent.  Mild disease is evident.  Abdominal aorta itself does not reveal any abdominal aortic aneurysm or stenosis.  Aortoiliac bifurcation is widely patent.  Femoral arteries bilaterally were widely patent all the way up to the above-knee and below-knee.  No significant disease was evident.  There is three-vessel runoff noted below the bilateral knee, there is mild disease in the small vessels.  The small vessels were not well visualized due to use of CO2 only however patency of the AT, PT and peroneal vessels documented.  Mild disease to moderate disease in the left peroneal and anterior tibial vessel was evident.  Recommendation: Patient symptoms is pseudoclaudication  not related to peripheral arterial disease.  Previously noted bilateral anterior tibial vessel occlusion is now widely patent, very mild disease is evident below the knee.  Medical therapy.  Evaluation for noncardiac causes of lower extremity weakness and pain indicated.  20 mL contrast utilized.  EKG   EKG 07/23/2022: Normal sinus rhythm with rate of 66 bpm, leftward axis, right bundle branch block.  PAC.  Compared to 01/02/2022, no significant change.  Assessment:     ICD-10-CM   1. Chronic mesenteric ischemia (HCC)  K55.1 EKG 12-Lead    2. Diarrhea due to malabsorption  K90.9 colestipol (COLESTID) 5 g granules   R19.7     3. Claudication in peripheral vascular disease (HCC)  I73.9     4. Dizziness  R42     5. Primary hypertension  I10 labetalol (NORMODYNE) 100 MG tablet    hydrALAZINE (APRESOLINE) 25 MG tablet      Meds ordered this encounter  Medications   labetalol (NORMODYNE) 100 MG tablet    Sig: Take 1 tablet (100 mg total) by mouth 2 (two) times daily.     Dispense:  60 tablet    Refill:  1   hydrALAZINE (APRESOLINE) 25 MG tablet    Sig: Take 1 tablet (25 mg total) by mouth 3 (three) times daily.    Dispense:  90 tablet    Refill:  2   colestipol (COLESTID) 5 g granules    Sig: Take 5 g by mouth 2 (two) times daily.    Dispense:  500 g    Refill:  12   Medications Discontinued During This Encounter  Medication Reason   Turmeric (QC TUMERIC COMPLEX PO)    meclizine (ANTIVERT) 25 MG tablet Ineffective   cilostazol (PLETAL) 50 MG tablet Side effect (s)   hydrALAZINE (APRESOLINE) 100 MG tablet Discontinued by provider   labetalol (NORMODYNE) 200 MG tablet    chlorthalidone (HYGROTON) 25 MG tablet Discontinued by provider   Recommendations:   Hafiz Irion  is a 88 y.o. male with history of CAD, S/P CABG in 2009, small vessel PAD, pseudoclaudication, type II diabetes with autonomic insufficiency and autonomic orthostatic hypotension, stage IIIa chronic kidney disease, primary hypertension, asymptomatic carotid stenosis, and hyperlipidemia.  He has patent graft except occluded SVG to RCA which is diffusely diseased small vessel and also severe below-knee bilateral lower disease by angiography on 11/04/2016 and mild small vessel peripheral arterial disease especially in the left leg angiography in April 2023.  Patient was referred back to me for evaluation of chronic intestinal ischemia in view of persistent diarrhea and also weight loss.  On further questioning, patient does not have any abdominal discomfort after eating, except he has increased frequency of stools.  Since weight loss his blood pressure is very low and soft.  He also has orthostatic hypotension.  In view of dizziness, with weight loss is blood pressure significantly improved, we will reduce the dose of the hydralazine from 100 mg 3 times daily to 25 mg 3 times daily, reduce the dose of labetalol from 200 mg twice daily to 100 mg twice daily and also discontinue chlorthalidone 1/2  tablet every other day that he was taking previously.  I will start him on colestipol 5 g twice daily to see if his diarrhea would improve.  I reviewed his CT angiogram of the abdomen, extremely complex anatomy.  He is at extreme high risk for embolic complications if he were to try to do angioplasty and this will be  the last resort.  I would recommend continued medical therapy.  He has severe extensive coronary calcification, abdominal aortic calcification and also mesenteric calcifications and stenosis.  Conservative therapy fails or if he is in significant discomfort or weight loss continues to be a major issue, will certainly consider angiography at that time.  Patient is enrolled in remote patient monitoring of blood pressure, will continue this. Enrolled in RPM RPM Patient: October 2023 BP Data  Average Systolic BP Level 641.89 mmHg  Lowest Systolic BP Level 373 mmHg  Highest Systolic BP Level 749 mmHg    [name], MD, [facility] 07/23/2022, 10:47 AM Office: [phone] Fax: [phone] Pager: [phone]       CC: [name], MD

## 2022-07-23 NOTE — Therapy (Signed)
OUTPATIENT PHYSICAL THERAPY VESTIBULAR TREATMENT     Patient Name: Justin [name] MRN: [id number] DOB:[birth year], 88 y.o., male Today's Date: 07/23/2022  PCP: Willene Hatchet, NP REFERRING PROVIDER: [phone] (ICD-10-CM):  Loura Pardon, MD    PT End of Session - 07/23/22 1906     Visit Number 8    Number of Visits 13    Date for PT Re-Evaluation 07/25/22    Authorization Type UHC Medicare    Authorization Time Period 06-05-22 - 07-29-22    PT Start Time 0852    PT Stop Time 0934    PT Time Calculation (Justin) 42 Justin    Activity Tolerance Patient tolerated treatment well    Behavior During Therapy [facility] for tasks assessed/performed                Past Medical History:  Diagnosis Date   Anxiety    Arthritis    Carotid arterial disease ([place])    CKD (chronic kidney disease)    Coronary artery disease    Diabetes mellitus without complication ([place])    Diabetic retinopathy ([place])    NPDR OU   Diverticulitis    Dyspnea    GERD (gastroesophageal reflux disease)    History of kidney stones 2021   Hypercholesteremia    Hypertension    Hypertensive retinopathy    OU   Myocardial infarction [facility]) 2009   Pneumonia    as a child   Prostate cancer ([place])    UTI (lower urinary tract infection)    Past Surgical History:  Procedure Laterality Date   ABDOMINAL SURGERY     for diverticulitis; also removed appendix   APPENDECTOMY     CARDIAC CATHETERIZATION  2018   CATARACT EXTRACTION     CATARACT EXTRACTION, BILATERAL     CHOLECYSTECTOMY N/A 10/12/2017   Procedure: LAPAROSCOPIC CHOLECYSTECTOMY;  Surgeon: [name], MD;  Location: [place];  Service: General;  Laterality: N/A;   CORONARY ARTERY BYPASS GRAFT  2009   ERCP N/A 12/21/2018   Procedure: ENDOSCOPIC RETROGRADE CHOLANGIOPANCREATOGRAPHY (ERCP);  Surgeon: [name], MD;  Location: [place];  Service: Endoscopy;  Laterality: N/A;   ESOPHAGOGASTRODUODENOSCOPY (EGD) WITH PROPOFOL N/A 12/17/2018   Procedure:  ESOPHAGOGASTRODUODENOSCOPY (EGD) WITH PROPOFOL;  Surgeon: [name], MD;  Location: [place];  Service: Endoscopy;  Laterality: N/A;   EUS Left 12/17/2018   Procedure: UPPER ENDOSCOPIC ULTRASOUND (EUS) LINEAR;  Surgeon: [name], MD;  Location: [place];  Service: Endoscopy;  Laterality: Left;   EYE SURGERY     "for bleeding in eye"   HERNIA REPAIR     LEFT HEART CATH AND CORONARY ANGIOGRAPHY N/A 11/04/2016   Procedure: Left Heart Cath and Coronary Angiography;  Surgeon: [name], MD;  Location: [place] CV LAB;  Service: Cardiovascular;  Laterality: N/A;   LOWER EXTREMITY ANGIOGRAPHY N/A 11/04/2016   Procedure: Lower Extremity Angiography;  Surgeon: [name], MD;  Location: [place] CV LAB;  Service: Cardiovascular;  Laterality: N/A;   LOWER EXTREMITY ANGIOGRAPHY N/A 01/03/2020   Procedure: LOWER EXTREMITY ANGIOGRAPHY;  Surgeon: [name], MD;  Location: [place] CV LAB;  Service: Cardiovascular;  Laterality: N/A;   LOWER EXTREMITY ANGIOGRAPHY Bilateral 01/14/2022   Procedure: Lower Extremity Angiography;  Surgeon: [name], MD;  Location: [place] CV LAB;  Service: Cardiovascular;  Laterality: Bilateral;   LUMBAR LAMINECTOMY/DECOMPRESSION MICRODISCECTOMY Bilateral 11/09/2020   Procedure: Laminectomy and Foraminotomy - bilateral - Lumbar Four-Five.;  Surgeon: Earnie Larsson, MD;  Location: [place];  Service: Neurosurgery;  Laterality: Bilateral;  posterior   PROSTATE SURGERY     REMOVAL OF STONES  12/21/2018   Procedure: REMOVAL OF STONES;  Surgeon: [name], MD;  Location: [facility] ENDOSCOPY;  Service: Endoscopy;;   SPHINCTEROTOMY  12/21/2018   Procedure: [name];  Surgeon: [name], MD;  Location: [place];  Service: Endoscopy;;   Patient Active Problem List   Diagnosis Date Noted   Near syncope 06/15/2021   Dizziness 06/15/2021   Lumbar stenosis with neurogenic claudication 11/09/2020   CKD stage 3 due to type 2 diabetes mellitus ([place]) 12/14/2019    Asymptomatic bilateral carotid artery stenosis 12/14/2019   Abdominal distention    CAD (coronary artery disease) 12/14/2018   Acute renal failure superimposed on stage 3 chronic kidney disease ([place]) 12/14/2018   UTI (urinary tract infection) 12/14/2018   HLD (hyperlipidemia) 12/14/2018   Sepsis ([place]) 12/14/2018   Abnormal LFTs    S/P CABG (coronary artery bypass graft) 12/06/2018   Asymptomatic stenosis of right carotid artery 12/06/2018   Claudication in peripheral vascular disease ([place]) 12/06/2018   Orthostatic hypotension 12/06/2018   Elevated liver enzymes 12/06/2018   Abdominal pain 10/12/2018   Abnormal liver function tests 10/12/2018   Foreign body in stomach 10/12/2018   Primary hypertension    GERD (gastroesophageal reflux disease)    Diabetes mellitus without complication ([place])    Atherosclerosis of native coronary artery of native heart without angina pectoris     ONSET DATE: approx. A year ago for onset of symptoms:  Referral date 05-26-22  REFERRING DIAG: R42 (ICD-10-CM) - Dizziness and giddiness   THERAPY DIAG:  Unsteadiness on feet  Other abnormalities of gait and mobility  Dizziness and giddiness  Rationale for Evaluation and Treatment Rehabilitation  SUBJECTIVE:   SUBJECTIVE STATEMENT: Pt reports he is "doing OK"; continues to use cane most of the time in his home; no longer using RW at all.  Daughter states pt liked riding the bike in previous session.   Pt accompanied by: family member - daughter  PERTINENT HISTORY: CKD stage 3 due to DM type 2:  h/o MI 2009: lumbar stenosis:  orthostatic hypotension:  HTN, h/o near syncope, carotid arterial disease, peripheral neuropathy, diabetic retinopathy   PAIN:  "Usual pain due to the neuropathy" Are you having pain? Yes: NPRS scale: 5-6/10 Pain location: "from his head to his toes" Pain description: burning, tingling, tightness as if a compressive force is on RLE (due to edema) Aggravating factors:  unknown Relieving factors: unknown  PRECAUTIONS: None  WEIGHT BEARING RESTRICTIONS No  FALLS: Has patient fallen in last 6 months? Yes. Number of falls 1  LIVING ENVIRONMENT: Lives with: lives with their family Lives in: House/apartment Has following equipment at home: Single point cane and Environmental consultant - 2 wheeled  PLOF: Independent with basic ADLs and Independent with household mobility with device  PATIENT GOALS "stop being dizzy":    OBJECTIVE: TODAY'S TREATMENT ;  07-22-22  Therex: Runner's stretch bil. LE's 20 sec hold each leg x 1 rep;   Gastroc stretch with forefeet on bottom shelf of cabinet - 20 sec hold x 1 rep  NeuroRe-ed;  Tap ups to 1st step - 6" - 5 reps each foot alternating; tap ups to 2nd step 5 reps each foot with CGA to Justin assist for balance recovery without UE support  Standing Balance: Surface: Pillows Position: Feet Hip Width Apart Completed with: Eyes Open; Head Turns x 5 Reps    Rockerboard - inside // bars - 10 reps  with bil. UE support; 10 reps with 2 finger support on each hand; 10 reps without UE support with Justin assist Stepping over/back of balance beam 5 reps each leg; 5 reps sideways with UE support on // bars prn with CGA  Pt performed static standing balance on blue mat - stepping forward/back 5 reps each leg unilaterally; stepping laterally 5 reps each leg; stepping back/up 5 reps each leg with mod to Justin assist; marching in place 10 reps each leg on blue mat with Justin to mod assist  Pt performed cone taps standing on floor - 2 reps each foot to 3 cones with CGA to Justin assist;  progressed to standing on blue mat - 4 cones used - cone taps with Justin to mod assist with balance   Cervical rotation stretch with towel (Mulligan's stretch) for Rt cervical musc. 15 secs x 1 rep - due to pt reporting some pain in Rt lateral cervical region; pt reported he did not feel stretch with this movement and with use of towel      GAIT: Gait pattern: step  through pattern, decreased arm swing- Left, decreased step length- Left, decreased stance time- Right, decreased stride length, decreased hip/knee flexion- Right, decreased hip/knee flexion- Left, decreased ankle dorsiflexion- Right, decreased ankle dorsiflexion- Left, lateral lean- Right, narrow BOS, poor foot clearance- Right, and poor foot clearance- Left Distance walked: 12' with cues to increase speed and to look straight ahead rather than looking down Assistive device utilized: No device used Level of assistance: CGA  Comments: Pt ambulated into/out of clinic w/SPC       PATIENT EDUCATION: Education details:  Continue HEP, next appointment time  Person educated: Patient and daughter Education method: Explanation  Education comprehension: verbalized understanding  HEP: Balance exercises- issued for HEP (see PT note on 06/24/22)   GOALS: Goals reviewed with patient? Yes  SHORT TERM GOALS: Target date: 06/26/2022 (3 weeks)   Independent in HEP for balance and vestibular exercises. Baseline: pt reports performing at home.  Goal status: MET  2.  Participate in [place] balance testing.  Baseline: 43/56 Goal status: MET  3.  Improve TUG score to </= 23 secs with SPC with SBA. Baseline: 27.25 secs with SPC; 18.75 seconds on 06/27/22 Goal status: MET  4.  Pt will subjectively report at least 25% improvement in dizziness Baseline: "yeah I would say so" Goal status: MET   LONG TERM GOALS: Target date: 07/25/2022  (6 weeks)  Improve Berg balance test score by at least 4 points to reduce fall risk. Baseline: 43/56 Goal status: INITIAL  2.  Improve FOTO score DPS from 45/100 to at least 53/100 to demo improvement in dizziness. Baseline: DPS 45/100;  DFS 35.5 Goal status: INITIAL  3.  Improve DVA to </= 2 line difference for improved gaze stabilization. Baseline:  Goal status: INITIAL  4.  Amb. 115' with SPC with intermittent horizontal head turns for environmental scanning  without LOB, with SBA.  Baseline:  Goal status: INITIAL  5.  Independent in updated HEP for balance and vestibular exercises.  Baseline:  Goal status: INITIAL   ASSESSMENT:  CLINICAL IMPRESSION:  PT session focused on balance training on compliant and noncompliant surfaces for increased vestibular input.  Pt continues to have moderate unsteadiness with standing on compliant surfaces and also reports increased dizziness, which appears to be due to neuropathy producing dysequilibrium.  Pt reported increased Rt lateral cervical pain when he turns his head to Rt side, however, denied feeling stretch  with use of towel for Rt cervical rotation stretch.  Progressing well towards LTG's.  Continue POC.    OBJECTIVE IMPAIRMENTS decreased activity tolerance, decreased balance, difficulty walking, dizziness, impaired vision/preception, and pain.   ACTIVITY LIMITATIONS carrying, lifting, bending, standing, squatting, stairs, and locomotion level  PARTICIPATION LIMITATIONS: meal prep, cleaning, laundry, driving, shopping, and community activity  PERSONAL FACTORS Age, Time since onset of injury/illness/exacerbation, and 1-2 comorbidities: orthostatic hypotension and neuropathy  are also affecting patient's functional outcome.   REHAB POTENTIAL: Fair due to chronicity of deficits  CLINICAL DECISION MAKING: Evolving/moderate complexity  EVALUATION COMPLEXITY: Moderate   PLAN: PT FREQUENCY: 2x/week  PT DURATION: 6 weeks  PLANNED INTERVENTIONS: Therapeutic exercises, Therapeutic activity, Neuromuscular re-education, Balance training, Gait training, Patient/Family education, Self Care, Stair training, and Vestibular training  PLAN FOR NEXT SESSION:  Check LTG's and D/C    Chequita Mofield, [place], PT 07/23/2022, 7:08 PM

## 2022-07-24 ENCOUNTER — Ambulatory Visit: Payer: Medicare Other | Admitting: Physical Therapy

## 2022-07-24 VITALS — BP 162/69 | HR 59

## 2022-07-24 DIAGNOSIS — R2689 Other abnormalities of gait and mobility: Secondary | ICD-10-CM

## 2022-07-24 DIAGNOSIS — R42 Dizziness and giddiness: Secondary | ICD-10-CM

## 2022-07-24 DIAGNOSIS — R2681 Unsteadiness on feet: Secondary | ICD-10-CM

## 2022-07-24 NOTE — Therapy (Signed)
OUTPATIENT PHYSICAL THERAPY VESTIBULAR TREATMENT/DISCHARGE SUMMARY     Patient Name: Justin [name] MRN: [id number] DOB:22-Jul-1933, 88 y.o., male Today's Date: 07/25/2022  PCP: Willene Hatchet, NP REFERRING PROVIDER: [phone] (ICD-10-CM):  Loura Pardon, MD    PT End of Session - 07/25/22 0738     Visit Number 9    Number of Visits 13    Date for PT Re-Evaluation 07/25/22    Authorization Type UHC Medicare    Authorization Time Period 06-05-22 - 07-29-22    PT Start Time 0800    PT Stop Time 0845    PT Time Calculation (min) 45 min    Activity Tolerance Patient tolerated treatment well    Behavior During Therapy [facility] for tasks assessed/performed                 Past Medical History:  Diagnosis Date   Anxiety    Arthritis    Carotid arterial disease ([place])    CKD (chronic kidney disease)    Coronary artery disease    Diabetes mellitus without complication ([place])    Diabetic retinopathy ([place])    NPDR OU   Diverticulitis    Dyspnea    GERD (gastroesophageal reflux disease)    History of kidney stones 2021   Hypercholesteremia    Hypertension    Hypertensive retinopathy    OU   Myocardial infarction [facility]) 2009   Pneumonia    as a child   Prostate cancer ([place])    UTI (lower urinary tract infection)    Past Surgical History:  Procedure Laterality Date   ABDOMINAL SURGERY     for diverticulitis; also removed appendix   APPENDECTOMY     CARDIAC CATHETERIZATION  2018   CATARACT EXTRACTION     CATARACT EXTRACTION, BILATERAL     CHOLECYSTECTOMY N/A 10/12/2017   Procedure: LAPAROSCOPIC CHOLECYSTECTOMY;  Surgeon: [name], MD;  Location: [place];  Service: General;  Laterality: N/A;   CORONARY ARTERY BYPASS GRAFT  2009   ERCP N/A 12/21/2018   Procedure: ENDOSCOPIC RETROGRADE CHOLANGIOPANCREATOGRAPHY (ERCP);  Surgeon: [name], MD;  Location: [place];  Service: Endoscopy;  Laterality: N/A;   ESOPHAGOGASTRODUODENOSCOPY (EGD) WITH PROPOFOL N/A  12/17/2018   Procedure: ESOPHAGOGASTRODUODENOSCOPY (EGD) WITH PROPOFOL;  Surgeon: [name], MD;  Location: [place];  Service: Endoscopy;  Laterality: N/A;   EUS Left 12/17/2018   Procedure: UPPER ENDOSCOPIC ULTRASOUND (EUS) LINEAR;  Surgeon: [name], MD;  Location: [place];  Service: Endoscopy;  Laterality: Left;   EYE SURGERY     "for bleeding in eye"   HERNIA REPAIR     LEFT HEART CATH AND CORONARY ANGIOGRAPHY N/A 11/04/2016   Procedure: Left Heart Cath and Coronary Angiography;  Surgeon: [name], MD;  Location: [place] CV LAB;  Service: Cardiovascular;  Laterality: N/A;   LOWER EXTREMITY ANGIOGRAPHY N/A 11/04/2016   Procedure: Lower Extremity Angiography;  Surgeon: [name], MD;  Location: [place] CV LAB;  Service: Cardiovascular;  Laterality: N/A;   LOWER EXTREMITY ANGIOGRAPHY N/A 01/03/2020   Procedure: LOWER EXTREMITY ANGIOGRAPHY;  Surgeon: [name], MD;  Location: [place] [facility];  Service: Cardiovascular;  Laterality: N/A;   LOWER EXTREMITY ANGIOGRAPHY Bilateral 01/14/2022   Procedure: Lower Extremity Angiography;  Surgeon: [name], MD;  Location: [place] CV LAB;  Service: Cardiovascular;  Laterality: Bilateral;   LUMBAR LAMINECTOMY/DECOMPRESSION MICRODISCECTOMY Bilateral 11/09/2020   Procedure: Laminectomy and Foraminotomy - bilateral - Lumbar Four-Five.;  Surgeon: Earnie Larsson, MD;  Location: [place];  Service: Neurosurgery;  Laterality: Bilateral;  posterior   PROSTATE SURGERY     REMOVAL OF STONES  12/21/2018   Procedure: REMOVAL OF STONES;  Surgeon: [name], MD;  Location: [facility] ENDOSCOPY;  Service: Endoscopy;;   SPHINCTEROTOMY  12/21/2018   Procedure: [name];  Surgeon: [name], MD;  Location: [place];  Service: Endoscopy;;   Patient Active Problem List   Diagnosis Date Noted   Near syncope 06/15/2021   Dizziness 06/15/2021   Lumbar stenosis with neurogenic claudication 11/09/2020   CKD stage 3 due to type 2 diabetes mellitus  ([place]) 12/14/2019   Asymptomatic bilateral carotid artery stenosis 12/14/2019   Abdominal distention    CAD (coronary artery disease) 12/14/2018   Acute renal failure superimposed on stage 3 chronic kidney disease ([place]) 12/14/2018   UTI (urinary tract infection) 12/14/2018   HLD (hyperlipidemia) 12/14/2018   Sepsis ([place]) 12/14/2018   Abnormal LFTs    S/P CABG (coronary artery bypass graft) 12/06/2018   Asymptomatic stenosis of right carotid artery 12/06/2018   Claudication in peripheral vascular disease ([place]) 12/06/2018   Orthostatic hypotension 12/06/2018   Elevated liver enzymes 12/06/2018   Abdominal pain 10/12/2018   Abnormal liver function tests 10/12/2018   Foreign body in stomach 10/12/2018   Primary hypertension    GERD (gastroesophageal reflux disease)    Diabetes mellitus without complication ([place])    Atherosclerosis of native coronary artery of native heart without angina pectoris     ONSET DATE: approx. A year ago for onset of symptoms:  Referral date 05-26-22  REFERRING DIAG: R42 (ICD-10-CM) - Dizziness and giddiness   THERAPY DIAG:  Unsteadiness on feet  Other abnormalities of gait and mobility  Dizziness and giddiness  Rationale for Evaluation and Treatment Rehabilitation  SUBJECTIVE:   SUBJECTIVE STATEMENT: Pt reports dizziness is better today than it was on Tuesday; saw MD yesterday (Wed.) and they changed dosage of BP medication because his BP was so low  Pt accompanied by: family member - daughter  PERTINENT HISTORY: CKD stage 3 due to DM type 2:  h/o MI 2009: lumbar stenosis:  orthostatic hypotension:  HTN, h/o near syncope, carotid arterial disease, peripheral neuropathy, diabetic retinopathy   PAIN:  "Usual pain due to the neuropathy" Are you having pain? Yes: NPRS scale: 5-6/10 Pain location: "from his head to his toes" Pain description: burning, tingling, tightness as if a compressive force is on RLE (due to edema) Aggravating factors:  unknown Relieving factors: unknown  PRECAUTIONS: None  WEIGHT BEARING RESTRICTIONS No  FALLS: Has patient fallen in last 6 months? Yes. Number of falls 1  LIVING ENVIRONMENT: Lives with: lives with their family Lives in: House/apartment Has following equipment at home: Single point cane and Environmental consultant - 2 wheeled  PLOF: Independent with basic ADLs and Independent with household mobility with device  PATIENT GOALS "stop being dizzy":    Vitals:   07/24/22 0804  BP: (!) 162/69  Pulse: (!) 59     OBJECTIVE: TODAY'S TREATMENT ;  07-24-22  Therex: Runner's stretch bil. LE's 20 sec hold each leg x 1 rep;   Gastroc stretch with forefeet on bottom shelf of cabinet - 20 sec hold x 1 rep  NeuroRe-ed:     [facility] PT Assessment - 07/24/22 0835       Berg Balance Test   Sit to Stand Able to stand without using hands and stabilize independently (P)     Standing Unsupported Able to stand safely 2 minutes (P)  Sitting with Back Unsupported but Feet Supported on Floor or Stool Able to sit safely and securely 2 minutes (P)     Stand to Sit Sits safely with minimal use of hands (P)     Transfers Able to transfer safely, minor use of hands (P)     Standing Unsupported with Eyes Closed Able to stand 10 seconds safely (P)     Standing Unsupported with Feet Together Able to place feet together independently and stand 1 minute safely (P)     From Standing, Reach Forward with Outstretched Arm Can reach confidently >25 cm (10") (P)    7"   From Standing Position, Pick up Object from Floor Able to pick up shoe safely and easily (P)     From Standing Position, Turn to Look Behind Over each Shoulder Looks behind from both sides and weight shifts well (P)     Turn 360 Degrees Able to turn 360 degrees safely but slowly (P)    7.25 to R, 8.31 to L   Standing Unsupported, Alternately Place Feet on Step/Stool Able to complete >2 steps/needs minimal assist (P)     Standing Unsupported, One Foot in Front  Able to plae foot ahead of the other independently and hold 30 seconds (P)     Standing on One Leg Tries to lift leg/unable to hold 3 seconds but remains standing independently (P)     Total Score 47 (P)     Berg comment: Improved 4 points from 43/56 on 06-27-22            DVA; 2 line difference from SVA; (WNL's); SVA line 9, DVA line 7   GAIT: Gait pattern: step through pattern, decreased arm swing- Left, decreased step length- Left, decreased stance time- Right, decreased stride length, decreased hip/knee flexion- Right, decreased hip/knee flexion- Left, decreased ankle dorsiflexion- Right, decreased ankle dorsiflexion- Left, lateral lean- Right, narrow BOS, poor foot clearance- Right, and poor foot clearance- Left Distance walked: 5' with cues to increase speed and to look straight ahead rather than looking down Assistive device utilized: No device used during session ; pt using SPC for community ambulation Level of assistance: CGA  Comments: Pt ambulated into/out of clinic w/SPC    Self Care; completed FOTO with pt; score ; DFS 45/100   PATIENT EDUCATION: Education details:  Continue HEP, next appointment time  Person educated: Patient and daughter Education method: Explanation  Education comprehension: verbalized understanding  HEP: Balance exercises- issued for HEP (see PT note on 06/24/22)   GOALS: Goals reviewed with patient? Yes  SHORT TERM GOALS: Target date: 06/26/2022 (3 weeks)   Independent in HEP for balance and vestibular exercises. Baseline: pt reports performing at home.  Goal status: MET  2.  Participate in [place] balance testing.  Baseline: 43/56 Goal status: MET  3.  Improve TUG score to </= 23 secs with SPC with SBA. Baseline: 27.25 secs with SPC; 18.75 seconds on 06/27/22 Goal status: MET  4.  Pt will subjectively report at least 25% improvement in dizziness Baseline: "yeah I would say so" Goal status: MET   LONG TERM GOALS: Target date:  07/25/2022  (6 weeks)  Improve Berg balance test score by at least 4 points to reduce fall risk. Baseline: 43/56;  07-24-22 score 47/56 Goal status: Goal met   2.  Improve FOTO score DPS from 45/100 to at least 53/100 to demo improvement in dizziness. Baseline: DPS 45/100;  DFS 35.5 Goal status: Goal met 07-24-22  3.  Improve DVA to </= 2 line difference for improved gaze stabilization. Baseline:  Goal status: Goal met - line 9/line 7 on 07-24-22  4.  Amb. 115' with SPC with intermittent horizontal head turns for environmental scanning without LOB, with SBA.  Baseline:  Goal status: Goal met   5.  Independent in updated HEP for balance and vestibular exercises.  Baseline:  Goal status: Goal met   ASSESSMENT:  CLINICAL IMPRESSION:  Pt has met all LTG's; Berg balance test score has increased from 43/56 to 47/56, indicating reduced fall risk.  Pt is reporting less dizziness in today's session than on Tuesday this week - daughter states she attributes the increased dizziness earlier in the week to low BP; reports pt saw MD on 07-23-22 and dosage of BP medication was changed.  Pt is discharged due to LTG's met - pt and family agree with D/C at this time.   OBJECTIVE IMPAIRMENTS decreased activity tolerance, decreased balance, difficulty walking, dizziness, impaired vision/preception, and pain.   ACTIVITY LIMITATIONS carrying, lifting, bending, standing, squatting, stairs, and locomotion level  PARTICIPATION LIMITATIONS: meal prep, cleaning, laundry, driving, shopping, and community activity  PERSONAL FACTORS Age, Time since onset of injury/illness/exacerbation, and 1-2 comorbidities: orthostatic hypotension and neuropathy  are also affecting patient's functional outcome.   REHAB POTENTIAL: Fair due to chronicity of deficits  CLINICAL DECISION MAKING: Evolving/moderate complexity  EVALUATION COMPLEXITY: Moderate   PLAN: PT FREQUENCY: 2x/week  PT DURATION: 6 weeks  PLANNED  INTERVENTIONS: Therapeutic exercises, Therapeutic activity, Neuromuscular re-education, Balance training, Gait training, Patient/Family education, Self Care, Stair training, and Vestibular training  PLAN FOR NEXT SESSION:  D/C on 07-24-22    PHYSICAL THERAPY DISCHARGE SUMMARY  Visits from Start of Care: 9  Current functional level related to goals / functional outcomes: See above for progress towards goals   Remaining deficits: Cont. Decreased high level balance skills and decreased high level gait; cont. C/o dizziness of varying intensity (multi-factorial in etiology)   Education / Equipment: Pt has been instructed in HEP for balance and LE strengthening.    Patient agrees to discharge. Patient goals were met. Patient is being discharged due to meeting the stated rehab goals.    Alda Lea, PT 07/25/2022, 7:47 AM

## 2022-07-24 NOTE — Progress Notes (Signed)
   07/24/22 0835  Berg Balance Test  Sit to Stand 4  Standing Unsupported 4  Sitting with Back Unsupported but Feet Supported on Floor or Stool 4  Stand to Sit 4  Transfers 4  Standing Unsupported with Eyes Closed 4  Standing Unsupported with Feet Together 4  From Standing, Reach Forward with Outstretched Arm 4 (7")  From Standing Position, Pick up Object from Floor 4  From Standing Position, Turn to Look Behind Over each Shoulder 4  Turn 360 Degrees 2 (7.25 to R, 8.31 to L)  Standing Unsupported, Alternately Place Feet on Step/Stool 1  Standing Unsupported, One Foot in Front 3  Standing on One Leg 1  Total Score 47  Berg comment: 43/56 = Significant Fall Risk

## 2022-07-25 ENCOUNTER — Encounter: Payer: Self-pay | Admitting: Physical Therapy

## 2022-07-31 ENCOUNTER — Ambulatory Visit: Payer: Medicare Other | Admitting: Cardiology

## 2022-08-05 ENCOUNTER — Telehealth: Payer: Self-pay

## 2022-08-05 DIAGNOSIS — I10 Essential (primary) hypertension: Secondary | ICD-10-CM

## 2022-08-05 MED ORDER — SPIRONOLACTONE-HCTZ 25-25 MG PO TABS: 0.5000 | ORAL_TABLET | ORAL | 1 refills | Status: DC

## 2022-08-05 NOTE — Telephone Encounter (Signed)
Patient was seen in office on 10/25 hydralazine was reduced from 100 mg TID to 25 mg TID and labetalol from 200 mg BID to 100 mg BID, chlorthalidone was d/c and patient was started on colestipol 5 g BID. Spoke with his daughter, she states patient is doing well and tolerating medication changes. No issues or concerns to report.  Reviewed patient's BP readings. BP has increased compared to previous readings. Please advise.  08/05/2022 Tuesday at 07:08 AM 165 / 84      08/05/2022 Tuesday at 07:06 AM 164 / 84      08/04/2022 Monday at 07:02 AM 164 / 88      08/04/2022 Monday at 07:01 AM 163 / 83      08/02/2022 Saturday at 07:01 AM 150 / 76      08/01/2022 Friday at 06:55 AM 147 / 79      07/31/2022 Thursday at 07:14 AM 155 / 86      07/30/2022 Wednesday at 06:40 AM 162 / 86      07/29/2022 Tuesday at 07:10 AM 158 / 93      07/28/2022 Monday at 07:11 AM 162 / 89      07/28/2022 Monday at 07:09 AM 162 / 88      07/27/2022 'Sunday at 07:12 AM 158 / 86      07/26/2022 Saturday at 07:09 AM 155 / 82      07/25/2022 Friday at 07:13 AM 162 / 91      07/24/2022 Thursday at 06:28 AM 167 / 88      07/24/2022 Thursday at 06:27 AM 167 / 88      07/23/2022 Wednesday at 07:14 AM 146 / 73      07/23/2022 Wednesday at 07:13 AM 149 / 73      07/22/2022 Tuesday at 07:06 AM 139 / 70      07/21/2022 Monday at 07:09 AM 139 / 72      07/20/2022 Sunday at 07:05 AM 135 / 71      07/19/2022 Saturday at 07:10 AM 130 / 64      10'$ /20/2023 Friday at 06:26 AM 138 / 68

## 2022-08-05 NOTE — Telephone Encounter (Signed)
ICD-10-CM   1. Primary hypertension  I10 spironolactone-hydrochlorothiazide (ALDACTAZIDE) 25-25 MG tablet    Basic metabolic panel     Orders Placed This Encounter  Procedures   Basic metabolic panel    Meds ordered this encounter  Medications   spironolactone-hydrochlorothiazide (ALDACTAZIDE) 25-25 MG tablet    Sig: Take 0.5 tablets by mouth every morning.    Dispense:  30 tablet    Refill:  1

## 2022-08-21 LAB — BASIC METABOLIC PANEL WITH GFR
BUN/Creatinine Ratio: 22 (ref 10–24)
BUN: 31 mg/dL — ABNORMAL HIGH (ref 8–27)
CO2: 16 mmol/L — ABNORMAL LOW (ref 20–29)
Calcium: 9.4 mg/dL (ref 8.6–10.2)
Chloride: 113 mmol/L — ABNORMAL HIGH (ref 96–106)
Creatinine, Ser: 1.4 mg/dL — ABNORMAL HIGH (ref 0.76–1.27)
Glucose: 126 mg/dL — ABNORMAL HIGH (ref 70–99)
Potassium: 5.3 mmol/L — ABNORMAL HIGH (ref 3.5–5.2)
Sodium: 140 mmol/L (ref 134–144)
eGFR: 49 mL/min/1.73 — ABNORMAL LOW

## 2022-08-21 NOTE — Progress Notes (Signed)
Ask him to avoid fruit juices and also banana and grapes

## 2022-08-25 NOTE — Progress Notes (Signed)
Spoke with patients daughter, she will relay the message to patient.

## 2022-09-03 ENCOUNTER — Ambulatory Visit: Payer: Medicare Other | Admitting: Cardiology

## 2022-09-03 ENCOUNTER — Encounter: Payer: Self-pay | Admitting: Cardiology

## 2022-09-03 VITALS — BP 109/60 | HR 67 | Resp 16 | Ht 67.0 in | Wt 159.0 lb

## 2022-09-03 DIAGNOSIS — I951 Orthostatic hypotension: Secondary | ICD-10-CM

## 2022-09-03 DIAGNOSIS — K551 Chronic vascular disorders of intestine: Secondary | ICD-10-CM

## 2022-09-03 DIAGNOSIS — I10 Essential (primary) hypertension: Secondary | ICD-10-CM

## 2022-09-03 DIAGNOSIS — I739 Peripheral vascular disease, unspecified: Secondary | ICD-10-CM

## 2022-09-03 DIAGNOSIS — I6523 Occlusion and stenosis of bilateral carotid arteries: Secondary | ICD-10-CM

## 2022-09-03 MED ORDER — HYDRALAZINE HCL 25 MG PO TABS: 25.0000 mg | ORAL_TABLET | Freq: Two times a day (BID) | ORAL | 1 refills | Status: DC

## 2022-09-03 NOTE — Progress Notes (Signed)
Primary Physician:  Willene Hatchet, NP   Patient ID: Justin [name], male    DOB: 1932-10-11, 88 y.o.   MRN: [id number]  Chief Complaint  Patient presents with   Hypertension   Chronic diarrhea   Dizziness    orthostatics   Follow-up    6 weeks   HPI:    Justin [name]  is a 88 y.o. male with history of CAD, S/P CABG in 2009, small vessel PAD, pseudoclaudication, type II diabetes with autonomic insufficiency and autonomic orthostatic hypotension, stage IIIa chronic kidney disease, primary hypertension, asymptomatic carotid stenosis, and hyperlipidemia.  He has patent graft except occluded SVG to RCA which is diffusely diseased small vessel and also severe below-knee bilateral lower disease by angiography on 11/04/2016 and mild small vessel peripheral arterial disease especially in the left leg angiography in April 2023.  Patient has chronic dizziness but over the past few weeks it is gotten worse.  Patient has lost about 20 pounds in weight.  He underwent CT angiogram of the abdomen which revealed severe mesenteric stenosis and hence was referred back to me.  On further questioning, patient has not had any abdominal discomfort.  GI evaluation including colonoscopy and endoscopy did not reveal any ischemic changes except mild diverticulosis.  Symptoms of claudication remained stable.  He is accompanied by his daughter. Past Medical History:  Diagnosis Date   Anxiety    Arthritis    Carotid arterial disease ([place])    CKD (chronic kidney disease)    Coronary artery disease    Diabetes mellitus without complication (HCC)    Diabetic retinopathy ([place])    NPDR OU   Diverticulitis    Dyspnea    GERD (gastroesophageal reflux disease)    History of kidney stones 2021   Hypercholesteremia    Hypertension    Hypertensive retinopathy    OU   Myocardial infarction [facility]) 2009   Pneumonia    as a child   Prostate cancer ([place])    UTI (lower urinary tract infection)    Social  History   Tobacco Use   Smoking status: Former    Packs/day: 0.25    Years: 2.00    Total pack years: 0.50    Types: Cigarettes   Smokeless tobacco: Never   Tobacco comments:    when he was a teenage age [age]  Substance Use Topics   Alcohol use: No    Marital Status: Widowed  ROS   Review of Systems  Cardiovascular:  Positive for dyspnea on exertion. Negative for chest pain, claudication and leg swelling.  Gastrointestinal:  Positive for diarrhea.   Objective  Blood pressure 109/60, pulse 67, resp. rate 16, height _0  (1.702 m), weight 159 lb (72.1 kg), SpO2 99 %. Body mass index is 24.9 kg/m.     09/03/2022    9:09 AM 07/24/2022    8:04 AM 07/23/2022   10:00 AM  Vitals with BMI  Height _1   _2   Weight 159 lbs  162 lbs 13 oz  BMI 61.6  07.37  Systolic 106 269 91  Diastolic 60 69 47  Pulse 67 59 74   Orthostatic VS for the past 72 hrs (Last 3 readings):  Orthostatic BP Patient Position BP Location Cuff Size Orthostatic Pulse  09/03/22 0915 109/60 Standing Left Arm Normal 88  09/03/22 0914 136/61 Sitting Left Arm Normal 69  09/03/22 0913 168/66 Supine Left Arm Normal 66    Physical Exam Vitals reviewed.  Constitutional:      Appearance: He is well-developed.  Neck:     Vascular: Carotid bruit (bilateral) present. No JVD.  Cardiovascular:     Rate and Rhythm: Normal rate and regular rhythm.     Pulses:          Femoral pulses are 2+ on the right side and 2+ on the left side.      Popliteal pulses are 1+ on the right side and 1+ on the left side.       Dorsalis pedis pulses are 0 on the right side and 0 on the left side.       Posterior tibial pulses are 0 on the right side and 0 on the left side.     Heart sounds: Normal heart sounds, S1 normal and S2 normal. No murmur heard.    No gallop.  Pulmonary:     Effort: Pulmonary effort is normal. No accessory muscle usage.     Breath sounds: Normal breath sounds.  Abdominal:     General: Bowel sounds are  normal.     Palpations: Abdomen is soft.  Musculoskeletal:     Right lower leg: No edema.     Left lower leg: No edema.    Laboratory examination:      Latest Ref Rng & Units 08/20/2022    8:27 AM 07/03/2022    9:30 AM 01/02/2022   10:18 AM  CMP  Glucose 70 - 99 mg/dL 126   140   BUN 8 - 27 mg/dL 31   28   Creatinine 0.76 - 1.27 mg/dL 1.40  1.20  1.42   Sodium 134 - 144 mmol/L 140   140   Potassium 3.5 - 5.2 mmol/L 5.3   5.0   Chloride 96 - 106 mmol/L 113   109   CO2 20 - 29 mmol/L 16   18   Calcium 8.6 - 10.2 mg/dL 9.4   9.5       Latest Ref Rng & Units 01/02/2022   10:18 AM 06/16/2021   12:57 AM 06/14/2021    1:14 PM  CBC  WBC 3.4 - 10.8 x10E3/uL 6.7  9.1  8.6   Hemoglobin 13.0 - 17.7 g/dL 10.5  11.3  11.0   Hematocrit 37.5 - 51.0 % 32.0  34.0  34.5   Platelets 150 - 450 x10E3/uL 177  187  198    Lipid Panel     Component Value Date/Time   CHOL 130 03/14/2021 0832   TRIG 104 03/14/2021 0832   HDL 49 03/14/2021 0832   LDLCALC 62 03/14/2021 0832   HEMOGLOBIN A1C Lab Results  Component Value Date   HGBA1C 7.2 (H) 11/06/2020   MPG 159.94 11/06/2020   Lab Results  Component Value Date   TSH 1.235 06/15/2021    External labs:  01/31/2022: Total cholesterol 143, HDL 59, LDL 70, triglycerides 61  BUN 33, creatinine 1.43, GFR 48, sodium 141, potassium 5.0  Hgb 10.4, HCT 32.5, MCV 93.7, platelet 187  TSH 0.99  BNP 165  Vitamin D 36  Allergies   Allergies  Allergen Reactions   Contrast Media [Iodinated Contrast Media] Itching    PT STATES ALLERGY TO "CT DYE".   Pletal [Cilostazol] Diarrhea   Amlodipine Swelling    Leg swelling   Dye Fdc Red [Red Dye] Swelling    CAT scan dye   Ibuprofen Other (See Comments)    Upset stomach      Final Medications at End of  Visit     Current Outpatient Medications:    ACCU-CHEK SMARTVIEW test strip, , Disp: , Rfl:    Accu-Chek Softclix Lancets lancets, , Disp: , Rfl:    acetaminophen (TYLENOL) 500 MG tablet, Take  1,000 mg by mouth 2 (two) times daily as needed for mild pain., Disp: , Rfl:    Blood Glucose Monitoring Suppl (ACCU-CHEK GUIDE) w/Device KIT, , Disp: , Rfl:    cholecalciferol (VITAMIN D3) 25 MCG (1000 UNIT) tablet, Take 1,000 Units by mouth daily., Disp: , Rfl:    clonazePAM (KLONOPIN) 0.5 MG tablet, Take 0.5 mg by mouth daily as needed for anxiety., Disp: , Rfl:    colestipol (COLESTID) 5 g granules, Take 5 g by mouth 2 (two) times daily., Disp: 500 g, Rfl: 12   docusate sodium (COLACE) 50 MG capsule, Take 50 mg by mouth 2 (two) times daily., Disp: , Rfl:    DULoxetine (CYMBALTA) 30 MG capsule, Take 2 capsules (60 mg total) by mouth daily., Disp: 90 capsule, Rfl: 3   ezetimibe (ZETIA) 10 MG tablet, TAKE 1 TABLET DAILY AFTER SUPPER (Patient taking differently: Take 10 mg by mouth every evening.), Disp: 90 tablet, Rfl: 3   labetalol (NORMODYNE) 100 MG tablet, Take 1 tablet (100 mg total) by mouth 2 (two) times daily., Disp: 60 tablet, Rfl: 1   LANTUS SOLOSTAR 100 UNIT/ML Solostar Pen, Inject 10 Units into the skin 2 (two) times daily. Inject 10 units in the morning and 10 units in the evening., Disp: , Rfl:    linaclotide (LINZESS) 145 MCG CAPS capsule, Take 145 mcg by mouth daily as needed (constipation)., Disp: , Rfl:    omeprazole (PRILOSEC) 40 MG capsule, Take 40 mg by mouth daily. , Disp: , Rfl:    Pyridoxine HCl (B-6 PO), Take 1 capsule by mouth daily., Disp: , Rfl:    simvastatin (ZOCOR) 20 MG tablet, Take 20 mg by mouth at bedtime., Disp: , Rfl:    spironolactone-hydrochlorothiazide (ALDACTAZIDE) 25-25 MG tablet, Take 0.5 tablets by mouth every morning., Disp: 30 tablet, Rfl: 1   tamsulosin (FLOMAX) 0.4 MG CAPS capsule, Take 1 capsule (0.4 mg total) by mouth daily after supper., Disp: 90 capsule, Rfl: 3   vitamin B-12 (CYANOCOBALAMIN) 500 MCG tablet, Take 500 mcg by mouth daily., Disp: , Rfl:    Vitamin E 268 MG (400 UNIT) CAPS, Take 400 Units by mouth daily., Disp: , Rfl:    hydrALAZINE  (APRESOLINE) 25 MG tablet, Take 1 tablet (25 mg total) by mouth in the morning and at bedtime. Hold if standing BP less than 130/80 mm Hg, Disp: 180 tablet, Rfl: 1   isosorbide dinitrate (ISORDIL) 30 MG tablet, Take 1 tablet (30 mg total) by mouth 2 (two) times daily. Hold if standing BP less than 130/80 mm Hg, Disp: 270 tablet, Rfl: 3   losartan (COZAAR) 50 MG tablet, Take 0.5 tablets (25 mg total) by mouth daily., Disp: 90 tablet, Rfl: 3  Radiology:   CT Abdomen 07/03/2022: 1. High-grade stenosis of the superior mesenteric artery due to a combination of hard and soft plaque. There is also substantial atheromatous narrowing of both renal arteries proximally, likely with high-grade stenosis. There is also moderate atheromatous narrowing at the origin of the celiac trunk secondary to calcified atheromatous plaque. The inferior mesenteric artery is also heavily calcified but appears to opacify distal to this.   If the patient's weight loss is also associated with postprandial abdominal pain, possibility of chronic mesenteric ischemia might be considered  given the degree of atheromatous disease, although there is no overt vessel occlusion currently identified.  Cardiac Studies:   Coronary angiogram 11/04/2016: Native RCA small and diffusely diseased with distal 70-80% stenosis. Occluded SVG to RCA. Patent SVG to OM-RI, LIMA to LAD. Medical therapy.  Lower Extremity Arterial Duplex 11/30/2019:  No hemodynamically significant stenosis in bilateral lower extremity above  the knee.  Moderate velocity increase at the right mid posterior tibial artery.  suggests >50%stenosis.  Diffuse small vessel disease with moderately abnormal waveform right ankle  and severely abnormal waveform left ankle.  Non compressible vessels bilateral at the ankles suggests medial  calcinosis.    No significant change from 05/02/2019.  PCV MYOCARDIAL PERFUSION WITH LEXISCAN 09/24/2020 Lexiscan nuclear stress test performed  using 1-day protocol. SPECT images showed small sized, mild intensity, mildly reversible perfusion defect in apical to basal inferior myocardium. Stress LVEF 53%. Low risk study.  ABI 02/11/2021: Right PT monophasic and non-compressible, DP biphasic and noncompressible.  TBI 0.59.  Right great toe pressure 102 mmHg. Left PT monophasic and noncompressible, DP monophasic and noncompressible.  TBI 0.42.  Left great toe pressure 73 mmHg. Bilateral TBI abnormal. Bilateral TBI suggest appropriate pressure for wound healing.   Patient has had peripheral arteriogram on 01/03/2020 revealing widely patent large vessels and small vessel disease.  In the absence of open wounds or nonhealing foot ulcer and absence of rest pain, recommend medical therapy  Echocardiogram 06/15/2021: 1. Left ventricular ejection fraction, by estimation, is 60 to 65%. The  left ventricle has normal function. The left ventricle has no regional  wall motion abnormalities. There is severe left ventricular hypertrophy.  Left ventricular diastolic parameters  are consistent with Grade I diastolic dysfunction (impaired relaxation).   2. Right ventricular systolic function is normal. The right ventricular size is normal.   3. Left atrial size was mildly dilated.   4. The mitral valve is abnormal. Mild to moderate mitral valve regurgitation.   5. The aortic valve is tricuspid. Mild aortic valve sclerosis is present, with no evidence of  aortic valve stenosis.   6. The inferior vena cava is normal in size with greater than 50%  respiratory variability, suggesting right atrial pressure of 3 mmHg.   Comparison(s): No significant change from prior study. 01/11/2021: LVEF  60-65%, moderate LVH, grade 1 DD, moderate MR.   Peripheral arteriogram 01/14/2022: Abdominal aortogram reveals presence of 2 renal arteries 1 on either sides.  Widely patent.  Mild disease is evident.  Abdominal aorta itself does not reveal any abdominal aortic aneurysm  or stenosis.  Aortoiliac bifurcation is widely patent.  Femoral arteries bilaterally were widely patent all the way up to the above-knee and below-knee.  No significant disease was evident.  There is three-vessel runoff noted below the bilateral knee, there is mild disease in the small vessels.  The small vessels were not well visualized due to use of CO2 only however patency of the AT, PT and peroneal vessels documented.  Mild disease to moderate disease in the left peroneal and anterior tibial vessel was evident.  Recommendation: Patient symptoms is pseudoclaudication not related to peripheral arterial disease.  Previously noted bilateral anterior tibial vessel occlusion is now widely patent, very mild disease is evident below the knee.  Medical therapy.  Evaluation for noncardiac causes of lower extremity weakness and pain indicated.  20 mL contrast utilized.   Carotid artery duplex 07/23/2022: Duplex suggests stenosis in the right internal carotid artery (50-69%), upper end of spectrum. No  evidence of significant stenosis in the right external carotid artery. The right PSV internal/common carotid artery ratio of 4.24 is consistent with a stenosis of >70%. Duplex suggests stenosis in the left internal carotid artery (1-15%). No evidence of significant stenosis in the left external carotid artery. Antegrade right vertebral artery flow.  Compared to 12/24/2021, minimal progression of the disease in the right ICA. Follow up in six months is appropriate if clinically indicated.  EKG   EKG 07/23/2022: Normal sinus rhythm with rate of 66 bpm, leftward axis, right bundle branch block.  PAC.  Compared to 01/02/2022, no significant change.  Assessment:     ICD-10-CM   1. Primary hypertension  I10 hydrALAZINE (APRESOLINE) 25 MG tablet    isosorbide dinitrate (ISORDIL) 30 MG tablet    losartan (COZAAR) 50 MG tablet    2. Orthostatic hypotension  I95.1     3. Claudication in peripheral vascular  disease (HCC)  I73.9     4. Chronic mesenteric ischemia ([place])  K55.1     5. Asymptomatic bilateral carotid artery stenosis  I65.23 PCV CAROTID DUPLEX (BILATERAL)      Meds ordered this encounter  Medications   hydrALAZINE (APRESOLINE) 25 MG tablet    Sig: Take 1 tablet (25 mg total) by mouth in the morning and at bedtime. Hold if standing BP less than 130/80 mm Hg    Dispense:  180 tablet    Refill:  1   Medications Discontinued During This Encounter  Medication Reason   isosorbide dinitrate (ISORDIL) 30 MG tablet Reorder   hydrALAZINE (APRESOLINE) 25 MG tablet Reorder   losartan (COZAAR) 50 MG tablet Reorder    Recommendations:   Everado Pillsbury  is a 88 y.o. male with history of CAD, S/P CABG in 2009, small vessel PAD, pseudoclaudication, type II diabetes with autonomic insufficiency and autonomic orthostatic hypotension, primary hypertension, asymptomatic carotid stenosis, stage IIIa chronic kidney disease and hyperlipidemia.    1. Primary hypertension Although he is sitting blood pressure is markedly elevated, he is severely orthostatic, supine blood pressure is also high.  Standing blood pressure is very well-controlled.  I have recommended that we treat only standing blood pressure, in fact in view of mesenteric ischemia that is chronic and is gradually losing weight, I would like to have him permissive hypertension to improve perfusion to his intestines.  I have reduced the dose of hydralazine and isosorbide dinitrate from 3 times daily to twice daily dosing and also in view of hypokalemia, I have reduced the dose of losartan from 50 mg to 25 mg daily.  2. Orthostatic hypotension He is still orthostatic however by reducing the dose of the labetalol, his symptoms improved.  Hopefully by changing the above his symptoms will continue to be much better.  3. Claudication in peripheral vascular disease ([place]) Claudication symptoms are very mild he has small vessel disease in his lower  extremity, no open wounds, medical therapy only.  4. Chronic mesenteric ischemia (HCC) Although he has mesenteric ischemia, he has been losing some weight, I am extremely concerned about instrumenting his abdomen because of high risk for embolic complications and also renal failure.  I have recommended that he take small meals and also high-protein diet on a daily basis and to continue to monitor his weight closely.  Patient and his family are in agreement.  5. Asymptomatic bilateral carotid artery stenosis Will continue surveillance for now, although he has >70% stenosis of the right ICA, still not significant enough for surgical consultation.  I will see him back in 6 months or sooner if problems.     Patient is enrolled in remote patient monitoring of blood pressure, will continue this. Enrolled in RPM Patient's home BP is fairly controlled.   Average Systolic BP Level 175.30 mmHg Lowest Systolic BP Level 104 mmHg Highest Systolic BP Level 045 mmHg  Average Diastolic BP Level 91.36 mmHg Lowest Diastolic BP Level 67 mmHg Highest Diastolic BP Level 87 mmHg  Average Pulse Level 77.68 BPM Lowest Pulse Level 71 BPM Highest Pulse Level 95 BPM  09/03/2022 Wednesday at 06:59 AM 134 / 76      09/02/2022 Tuesday at 06:53 AM 131 / 67      09/01/2022 Monday at 07:04 AM 139 / 78        [name], MD, [facility] 09/03/2022, 10:07 AM Office: [phone] Fax: [phone] Pager: [phone]

## 2022-09-03 NOTE — Telephone Encounter (Signed)
Patient's home BP is fairly controlled.   Average Systolic BP Level 209.19 mmHg Lowest Systolic BP Level 802 mmHg Highest Systolic BP Level 217 mmHg  Average Diastolic BP Level 98.10 mmHg Lowest Diastolic BP Level 67 mmHg Highest Diastolic BP Level 87 mmHg  Average Pulse Level 77.68 BPM Lowest Pulse Level 71 BPM Highest Pulse Level 95 BPM  09/03/2022 Wednesday at 06:59 AM 134 / 76      09/02/2022 Tuesday at 06:53 AM 131 / 67      09/01/2022 Monday at 07:04 AM 139 / 78      08/31/2022 'Sunday at 06:57 AM 148 / 76      08/31/2022 Sunday at 06:56 AM 150 / 77      08/30/2022 Saturday at 06:56 AM 135 / 72      08/29/2022 Friday at 06:55 AM 141 / 72      08/28/2022 Thursday at 06:56 AM 131 / 69      08/27/2022 Wednesday at 07:10 AM 145 / 77      11'$ /28/2023 Tuesday at 06:59 AM 148 / 76

## 2022-09-15 ENCOUNTER — Other Ambulatory Visit: Payer: Self-pay | Admitting: Cardiology

## 2022-09-15 DIAGNOSIS — I10 Essential (primary) hypertension: Secondary | ICD-10-CM

## 2022-09-26 ENCOUNTER — Ambulatory Visit (INDEPENDENT_AMBULATORY_CARE_PROVIDER_SITE_OTHER): Payer: Medicare Other | Admitting: Ophthalmology

## 2022-09-26 ENCOUNTER — Encounter (INDEPENDENT_AMBULATORY_CARE_PROVIDER_SITE_OTHER): Payer: Self-pay | Admitting: Ophthalmology

## 2022-09-26 DIAGNOSIS — H35033 Hypertensive retinopathy, bilateral: Secondary | ICD-10-CM

## 2022-09-26 DIAGNOSIS — Z961 Presence of intraocular lens: Secondary | ICD-10-CM | POA: Diagnosis not present

## 2022-09-26 DIAGNOSIS — I10 Essential (primary) hypertension: Secondary | ICD-10-CM | POA: Diagnosis not present

## 2022-09-26 DIAGNOSIS — E113313 Type 2 diabetes mellitus with moderate nonproliferative diabetic retinopathy with macular edema, bilateral: Secondary | ICD-10-CM | POA: Diagnosis not present

## 2022-09-26 MED ORDER — AFLIBERCEPT 2MG/0.05ML IZ SOLN FOR KALEIDOSCOPE
2.0000 mg | INTRAVITREAL | Status: AC | PRN
  Administered 2022-09-26: 2 mg via INTRAVITREAL

## 2022-09-26 NOTE — Progress Notes (Signed)
[facility] Note  09/26/2022     CHIEF COMPLAINT Patient presents for Retina Follow Up    HISTORY OF PRESENT ILLNESS: Justin [name] is a 88 y.o. male who presents to the clinic today for:   HPI     Retina Follow Up   Patient presents with  Diabetic Retinopathy.  In both eyes.  This started 10 weeks ago.  I, the attending physician,  performed the HPI with the patient and updated documentation appropriately.        Comments   Patient here for 10 weeks retina follow up for NPDR OU. Patient states vision doing pretty good. No eye pain. No drops.      Last edited by [name], MD on 09/26/2022 12:10 PM.    Patient states vision is stable, no new health concerns  Referring physician: Willene Hatchet, NP [place],  [place] [address]  HISTORICAL INFORMATION:   Selected notes from the MEDICAL RECORD NUMBER Referred by Dr. [name] for concern of mac edema OU   CURRENT MEDICATIONS: No current outpatient medications on file. (Ophthalmic Drugs)   No current facility-administered medications for this visit. (Ophthalmic Drugs)   Current Outpatient Medications (Other)  Medication Sig   ACCU-CHEK SMARTVIEW test strip    Accu-Chek Softclix Lancets lancets    acetaminophen (TYLENOL) 500 MG tablet Take 1,000 mg by mouth 2 (two) times daily as needed for mild pain.   Blood Glucose Monitoring Suppl (ACCU-CHEK GUIDE) w/Device KIT    cholecalciferol (VITAMIN D3) 25 MCG (1000 UNIT) tablet Take 1,000 Units by mouth daily.   clonazePAM (KLONOPIN) 0.5 MG tablet Take 0.5 mg by mouth daily as needed for anxiety.   colestipol (COLESTID) 5 g granules Take 5 g by mouth 2 (two) times daily.   docusate sodium (COLACE) 50 MG capsule Take 50 mg by mouth 2 (two) times daily.   DULoxetine (CYMBALTA) 30 MG capsule Take 2 capsules (60 mg total) by mouth daily.   ezetimibe (ZETIA) 10 MG tablet TAKE 1 TABLET DAILY AFTER SUPPER (Patient taking  differently: Take 10 mg by mouth every evening.)   hydrALAZINE (APRESOLINE) 25 MG tablet Take 1 tablet (25 mg total) by mouth in the morning and at bedtime. Hold if standing BP less than 130/80 mm Hg   isosorbide dinitrate (ISORDIL) 30 MG tablet Take 1 tablet (30 mg total) by mouth 2 (two) times daily. Hold if standing BP less than 130/80 mm Hg   labetalol (NORMODYNE) 100 MG tablet Take 1 tablet by mouth twice daily   LANTUS SOLOSTAR 100 UNIT/ML Solostar Pen Inject 10 Units into the skin 2 (two) times daily. Inject 10 units in the morning and 10 units in the evening.   linaclotide (LINZESS) 145 MCG CAPS capsule Take 145 mcg by mouth daily as needed (constipation).   losartan (COZAAR) 50 MG tablet Take 0.5 tablets (25 mg total) by mouth daily.   omeprazole (PRILOSEC) 40 MG capsule Take 40 mg by mouth daily.    Pyridoxine HCl (B-6 PO) Take 1 capsule by mouth daily.   simvastatin (ZOCOR) 20 MG tablet Take 20 mg by mouth at bedtime.   spironolactone-hydrochlorothiazide (ALDACTAZIDE) 25-25 MG tablet Take 0.5 tablets by mouth every morning.   tamsulosin (FLOMAX) 0.4 MG CAPS capsule Take 1 capsule (0.4 mg total) by mouth daily after supper.   vitamin B-12 (CYANOCOBALAMIN) 500 MCG tablet Take 500 mcg by mouth daily.   Vitamin E 268 MG (  400 UNIT) CAPS Take 400 Units by mouth daily.   No current facility-administered medications for this visit. (Other)   REVIEW OF SYSTEMS: ROS   Positive for: Gastrointestinal, Genitourinary, Endocrine, Cardiovascular, Eyes Negative for: Constitutional, Neurological, Skin, Musculoskeletal, HENT, Respiratory, Psychiatric, Allergic/Imm, Heme/Lymph Last edited by [name], COA on 09/26/2022  9:17 AM.     ALLERGIES Allergies  Allergen Reactions   Contrast Media [Iodinated Contrast Media] Itching    PT STATES ALLERGY TO "CT DYE".   Pletal [Cilostazol] Diarrhea   Amlodipine Swelling    Leg swelling   Dye Fdc Red [Red Dye] Swelling    CAT scan dye    Ibuprofen Other (See Comments)    Upset stomach    PAST MEDICAL HISTORY Past Medical History:  Diagnosis Date   Anxiety    Arthritis    Carotid arterial disease ([place])    CKD (chronic kidney disease)    Coronary artery disease    Diabetes mellitus without complication (HCC)    Diabetic retinopathy ([place])    NPDR OU   Diverticulitis    Dyspnea    GERD (gastroesophageal reflux disease)    History of kidney stones 2021   Hypercholesteremia    Hypertension    Hypertensive retinopathy    OU   Myocardial infarction [facility]) 2009   Pneumonia    as a child   Prostate cancer ([address])    UTI (lower urinary tract infection)    Past Surgical History:  Procedure Laterality Date   ABDOMINAL SURGERY     for diverticulitis; also removed appendix   APPENDECTOMY     CARDIAC CATHETERIZATION  2018   CATARACT EXTRACTION     CATARACT EXTRACTION, BILATERAL     CHOLECYSTECTOMY N/A 10/12/2017   Procedure: LAPAROSCOPIC CHOLECYSTECTOMY;  Surgeon: [name], MD;  Location: [place];  Service: General;  Laterality: N/A;   CORONARY ARTERY BYPASS GRAFT  2009   ERCP N/A 12/21/2018   Procedure: ENDOSCOPIC RETROGRADE CHOLANGIOPANCREATOGRAPHY (ERCP);  Surgeon: [name], MD;  Location: [place];  Service: Endoscopy;  Laterality: N/A;   ESOPHAGOGASTRODUODENOSCOPY (EGD) WITH PROPOFOL N/A 12/17/2018   Procedure: ESOPHAGOGASTRODUODENOSCOPY (EGD) WITH PROPOFOL;  Surgeon: [name], MD;  Location: [place];  Service: Endoscopy;  Laterality: N/A;   EUS Left 12/17/2018   Procedure: UPPER ENDOSCOPIC ULTRASOUND (EUS) LINEAR;  Surgeon: [name], MD;  Location: [place];  Service: Endoscopy;  Laterality: Left;   EYE SURGERY     "for bleeding in eye"   HERNIA REPAIR     LEFT HEART CATH AND CORONARY ANGIOGRAPHY N/A 11/04/2016   Procedure: Left Heart Cath and Coronary Angiography;  Surgeon: [name], MD;  Location: [place] [facility];  Service: Cardiovascular;  Laterality: N/A;   LOWER EXTREMITY  ANGIOGRAPHY N/A 11/04/2016   Procedure: Lower Extremity Angiography;  Surgeon: [name], MD;  Location: [place] [facility];  Service: Cardiovascular;  Laterality: N/A;   LOWER EXTREMITY ANGIOGRAPHY N/A 01/03/2020   Procedure: LOWER EXTREMITY ANGIOGRAPHY;  Surgeon: [name], MD;  Location: [place] [facility];  Service: Cardiovascular;  Laterality: N/A;   LOWER EXTREMITY ANGIOGRAPHY Bilateral 01/14/2022   Procedure: Lower Extremity Angiography;  Surgeon: [name], MD;  Location: [place] [facility];  Service: Cardiovascular;  Laterality: Bilateral;   LUMBAR LAMINECTOMY/DECOMPRESSION MICRODISCECTOMY Bilateral 11/09/2020   Procedure: Laminectomy and Foraminotomy - bilateral - Lumbar Four-Five.;  Surgeon: Earnie Larsson, MD;  Location: [place];  Service: Neurosurgery;  Laterality: Bilateral;  posterior   PROSTATE SURGERY  REMOVAL OF STONES  12/21/2018   Procedure: REMOVAL OF STONES;  Surgeon: [name], MD;  Location: [place];  Service: Endoscopy;;   SPHINCTEROTOMY  12/21/2018   Procedure: [name];  Surgeon: [name], MD;  Location[facility];  Service: Endoscopy;;   FAMILY HISTORY Family History  Problem Relation Age of Onset   Diabetes Father    Diabetes Maternal Aunt    Diabetes Maternal Uncle    Diabetes Maternal Grandmother    Heart disease Sister    Diabetes Brother    Heart disease Sister    SOCIAL HISTORY Social History   Tobacco Use   Smoking status: Former    Packs/day: 0.25    Years: 2.00    Total pack years: 0.50    Types: Cigarettes   Smokeless tobacco: Never   Tobacco comments:    when he was a teenage age [age]  Vaping Use   Vaping Use: Never used  Substance Use Topics   Alcohol use: No   Drug use: No       OPHTHALMIC EXAM: Base Eye Exam     Visual Acuity (Snellen - Linear)       Right Left   Dist cc 20/25 -2 20/25 -2   Dist ph cc NI NI    Correction: Glasses         Tonometry (Tonopen, 9:14 AM)       Right Left   Pressure 13  13         Pupils       Dark Light Shape React APD   Right 3 2 Round Brisk None   Left 3 2 Round Brisk None         Visual Fields (Counting fingers)       Left Right    Full Full         Extraocular Movement       Right Left    Full, Ortho Full, Ortho         Neuro/Psych     Oriented x3: Yes   Mood/Affect: Normal         Dilation     Both eyes: 1.0% Mydriacyl, 2.5% Phenylephrine @ 9:14 AM           Slit Lamp and Fundus Exam     Slit Lamp Exam       Right Left   Lids/Lashes Dermatochalasis - upper lid, Meibomian gland dysfunction Dermatochalasis - upper lid, Meibomian gland dysfunction   Conjunctiva/Sclera Melanosis Melanosis   Cornea Arcus, trace Punctate epithelial erosions, mild endopigment Arcus, 1-2+ Punctate epithelial erosions, trace endopigment, mild corneal haze   Anterior Chamber Deep and quiet Deep and quiet   Iris Mild Temporal Iris atrophy, Round and dilated, No NVI Mild Temporal Iris atrophy, Round and dilated, No NVI   Lens PC IOL in good position with mild aterior capsule fimosis PC IOL in good position with mild aterior capsule fimosis   Anterior Vitreous Vitreous syneresis, Posterior vitreous detachment, vitreous condensations Vitreous syneresis, Posterior vitreous detachment, vitreous condensations         Fundus Exam       Right Left   Disc pallor with sharp rim, Peripapillary atrophy, Compact mild pallor, sharp rim, Tilted disc, mild temporal Peripapillary atrophy, Compact   C/D Ratio 0.2 0.3   Macula good foveal reflex, Retinal pigment epithelial mottling and clumping, Microaneurysms -- improved, persistent central cystic changes -- slightly improved good foveal reflex, focal area of RPE atrophy SN to fovea, rare  Microaneurysms, Retinal pigment epithelial mottling, refractile Epiretinal membrane, central cystic changes -  improved   Vessels attenuated, Tortuous attenuated, Tortuous   Periphery Attached, rare, scattered MA,  good peripheral PRP 360 - room for posterior fill in if needed Attached, rare, scattered MA, Peripheral PRP 360 -- room for posterior fill in if needed           Refraction     Wearing Rx       Sphere Cylinder Axis Add   Right -0.75 +1.75 175 +3.00   Left -0.25 +1.50 180 +3.00    Type: Bifocal           IMAGING AND PROCEDURES  Imaging and Procedures for _0 @  OCT, Retina - OU - Both Eyes       Right Eye Quality was good. Central Foveal Thickness: 268. Progression has improved. Findings include no SRF, abnormal foveal contour, retinal drusen , intraretinal fluid, outer retinal atrophy (Interval improvement in central IRF/IRHM).   Left Eye Quality was good. Central Foveal Thickness: 244. Progression has improved. Findings include normal foveal contour, no SRF, retinal drusen , intraretinal hyper-reflective material, intraretinal fluid, outer retinal atrophy (interval improvement in IRF ST fovea, just trace IRHM and cystic changes remains).   Notes *Images captured and stored on drive  Diagnosis / Impression:  DME OU, OD>OS OD: Interval improvement in central IRF/IRHM OS: interval improvement in IRF ST fovea, just trace IRHM and cystic changes remains  Clinical management:  See below  Abbreviations: NFP - Normal foveal profile. CME - cystoid macular edema. PED - pigment epithelial detachment. IRF - intraretinal fluid. SRF - subretinal fluid. EZ - ellipsoid zone. ERM - epiretinal membrane. ORA - outer retinal atrophy. ORT - outer retinal tubulation. SRHM - subretinal hyper-reflective material       Intravitreal Injection, Pharmacologic Agent - OD - Right Eye       Time Out 09/26/2022. 9:58 AM. Confirmed correct patient, procedure, site, and patient consented.   Anesthesia Topical anesthesia was used. Anesthetic medications included Lidocaine 2%, Proparacaine 0.5%.   Procedure Preparation included 5% betadine to ocular surface, eyelid speculum.    Injection: 2 mg aflibercept 2 MG/0.05ML   Route: Intravitreal, Site: Right Eye   NDC: A3590391, Lot: [id number], Expiration date: 11/27/2023, Waste: 0 mL   Post-op Post injection exam found visual acuity of at least counting fingers. The patient tolerated the procedure well. There were no complications. The patient received written and verbal post procedure care education.      Intravitreal Injection, Pharmacologic Agent - OS - Left Eye       Time Out 09/26/2022. 9:59 AM. Confirmed correct patient, procedure, site, and patient consented.   Anesthesia Topical anesthesia was used. Anesthetic medications included Lidocaine 2%, Proparacaine 0.5%.   Procedure Preparation included 5% betadine to ocular surface, eyelid speculum. A (32 g) needle was used.   Injection: 2 mg aflibercept 2 MG/0.05ML   Route: Intravitreal, Site: Left Eye   NDC: A3590391, Lot: [id number], Expiration date: 11/27/2023, Waste: 0 mL   Post-op Post injection exam found visual acuity of at least counting fingers. The patient tolerated the procedure well. There were no complications. The patient received written and verbal post procedure care education. Post injection medications were not given.            ASSESSMENT/PLAN:    ICD-10-CM   1. Moderate nonproliferative diabetic retinopathy of both eyes with macular edema associated with type 2 diabetes mellitus ([place])  R00.7622 OCT, Retina - OU -  Both Eyes    Intravitreal Injection, Pharmacologic Agent - OD - Right Eye    Intravitreal Injection, Pharmacologic Agent - OS - Left Eye    aflibercept (EYLEA) SOLN 2 mg    aflibercept (EYLEA) SOLN 2 mg    2. Essential hypertension  I10     3. Hypertensive retinopathy of both eyes  H35.033     4. Pseudophakia of both eyes  Z96.1      1. Moderate non-proliferative diabetic retinopathy with edema OU  - moved to [place] from [place], [place] -- history of prior injections OS with Dr. [name]  - records  received and reviewed from [facility] [place] (Dr. [name])  - history of focal laser OS x2 and IVK/IVT OU in [place] -- last injection was IVK OS November 2013  - initial exam: scattered IRH, DBH and +macular edema  - initial OCT: diabetic macular edema, both eyes, (OD > OS)  - FA (10.1.19) showed late leaking MA OU -- no NV  - delayed f/u on 4.16.20 due to hospitalization for sepsis / gallstones  - S/P PRP OD (07.14.20) - good laser surrounding, room for posterior fill in if needed  - S/P PRP OS (08.26.20)  - S/P IVA OD #1 (09.17.19), #2 (10.29.19), #3 (11.26.19), #4 (01.02.20), #5 (01.30.20), #6 (02.28.20), #7 (04.16.20), #8 (06.11.20), #9 (08.12.20), #10 (01.11.21)  - S/P IVA OS #1 (10.01.19), #2 (10.29.19), #3 (11.26.19), #4 (01.02.20), #5 (01.30.20), #6 (02.28.20), #7 (04.16.20), #8 (05.14.20), #9 (06.11.20), #10 (07.09.20), #11 (08.12.20), #12 (01.11.21)  - S/P IVK OD #1 (05.14.20) -- no significant improvement  - switched back to Avastin OD (#9 6.11.2020)  - repeated Eylea4U benefits investigation due to possible IVA resistance (8.12.20)  - S/P IVE OS #1 (09.16.20), #2 (10.16.20), #3 (11.13.20), #4 (12.11.20), #5 (02.08.21), #6 (03.12.21), #7 (04.16.21), #8 (05.14.21), #9 (06.18.21), #10 (07.23.21), #11 (8.30.21), #12 (10.05.21), #13 (11.10.21), #14 (12.15.21), #15 (1.18.22), #16 (03.17.22), #17 (11.14.22), #18 (11.14.22), #19 (02.06.23), #20 (05.08.23), #21 (10.18.23)  - S/P IVE OD #1 (09.16.20), #2 (10.16.20), #3 (11.13.20), #4 (12.11.20), #5 (02.08.21), #6 (03.12.21), #7 (04.16.21), #8 (05.14.21), #9 (06.18.21), #10 (07.23.21), #11 (08.30.21), #12 (10.05.21), #13 (11.10.21), #14 (12.15.21), #15 (01.18.22), #16 (03.17.22), #17 (5.12.22), #18 (08.02.22), #19 (11.14.22), #20 (11.14.22), #21 (02.06.23), #22 (05.08.23), #23 (08.02.23), #24 (10.18.23)  - OCT today shows OD: Interval improvement in central IRF/IRHM; OS: interval improvement in IRF ST fovea, just trace IRHM and cystic changes  remains at 10 weeks  - BCVA 20/25 OU -- stable  - recommend IVE OU (OD #25, OS #22) today, 12.29.23 w/ f/u in 10 weeks again  - pt in agreement   - Eylea4U benefits investigation completed and verified for 2023  - RBA of procedure discussed, questions answered   - informed consent obtained  - see procedure note  - Eylea informed consent form re-signed and scanned on 05.08.23  - f/u 10 weeks, DFE, OCT, possible injections  2,3. Hypertensive retinopathy OU  - discussed importance of tight BP control  - continue to monitor  4. Pseudophakia OU  - s/p CE/IOL OU in [place], [place]  - beautiful surgeries, doing well  - continue to monitor  Ophthalmic Meds Ordered this visit:  Meds ordered this encounter  Medications   aflibercept (EYLEA) SOLN 2 mg   aflibercept (EYLEA) SOLN 2 mg     Return in about 10 weeks (around 12/05/2022) for f/u NPDR OU, DFE, OCT.  There are no Patient Instructions on file for this visit.  This  document serves as a record of services personally performed by Gardiner Sleeper, MD, PhD. It was created on their behalf by San Jetty. Owens Shark, OA an ophthalmic technician. The creation of this record is the provider's dictation and/or activities during the visit.    Electronically signed by: San Jetty. Owens Shark, [place] 12.29.2023 12:11 PM   Gardiner Sleeper, M.D., Ph.D. Diseases & Surgery of the Retina and Vitreous [facility]  I have reviewed the above documentation for accuracy and completeness, and I agree with the above. Gardiner Sleeper, M.D., Ph.D. 09/26/22 12:11 PM  Abbreviations: M myopia (nearsighted); A astigmatism; H hyperopia (farsighted); P presbyopia; Mrx spectacle prescription;  CTL contact lenses; OD right eye; OS left eye; OU both eyes  XT exotropia; ET esotropia; PEK punctate epithelial keratitis; PEE punctate epithelial erosions; DES dry eye syndrome; MGD meibomian gland dysfunction; ATs artificial tears; PFAT's preservative free artificial  tears; [place] nuclear sclerotic cataract; PSC posterior subcapsular cataract; ERM epi-retinal membrane; PVD posterior vitreous detachment; RD retinal detachment; DM diabetes mellitus; DR diabetic retinopathy; NPDR non-proliferative diabetic retinopathy; PDR proliferative diabetic retinopathy; CSME clinically significant macular edema; DME diabetic macular edema; dbh dot blot hemorrhages; CWS cotton wool spot; POAG primary open angle glaucoma; C/D cup-to-disc ratio; HVF humphrey visual field; GVF goldmann visual field; OCT optical coherence tomography; IOP intraocular pressure; BRVO Branch retinal vein occlusion; CRVO central retinal vein occlusion; CRAO central retinal artery occlusion; BRAO branch retinal artery occlusion; RT retinal tear; SB scleral buckle; PPV pars plana vitrectomy; VH Vitreous hemorrhage; PRP panretinal laser photocoagulation; IVK intravitreal kenalog; VMT vitreomacular traction; MH Macular hole;  NVD neovascularization of the disc; NVE neovascularization elsewhere; AREDS age related eye disease study; ARMD age related macular degeneration; POAG primary open angle glaucoma; EBMD epithelial/anterior basement membrane dystrophy; ACIOL anterior chamber intraocular lens; IOL intraocular lens; PCIOL posterior chamber intraocular lens; Phaco/IOL phacoemulsification with intraocular lens placement; [place] photorefractive keratectomy; LASIK laser assisted in situ keratomileusis; HTN hypertension; DM diabetes mellitus; COPD chronic obstructive pulmonary disease

## 2022-10-12 ENCOUNTER — Other Ambulatory Visit: Payer: Self-pay | Admitting: Cardiology

## 2022-10-12 DIAGNOSIS — I739 Peripheral vascular disease, unspecified: Secondary | ICD-10-CM

## 2022-10-16 ENCOUNTER — Telehealth: Payer: Self-pay

## 2022-10-16 NOTE — Telephone Encounter (Signed)
Patient home BP has been soft. Per discussion with Dr. Einar Gip - if patient is symptomatic will D/C losartan '25mg'$  daily.  Patient has not endorsed hypotensive symptoms (lightheadedness, dizziness). He also does not take hydralazine BID or isosorbide BID when BP < 130/80.  Patient's wife stated patient's health has declined over the past weeks and he is unable to walk on his own anymore and has little balance to where he starts to fall backwards when standing. She reports patient is losing weight still also. Patient's wife is consulting hospice (son works for a [employer]) to get some help for patient. Patient got labs done recently for a nephrology appointment next Friday.  Average Systolic BP Level 115.72 mmHg Lowest Systolic BP Level 93 mmHg Highest Systolic BP Level 620 mmHg  10/16/2022 Thursday at 06:55 AM 113 / 68      10/15/2022 Wednesday at 07:24 AM 109 / 64      10/14/2022 Tuesday at 06:55 AM 107 / 66      10/13/2022 Monday at 07:00 AM 112 / 63      10/12/2022 'Sunday at 06:50 AM 117 / 75      10/11/2022 Saturday at 06:49 AM 103 / 64      10/10/2022 Friday at 06:58 AM 111 / 66      10/09/2022 Thursday at 07:12 AM 100 / 59      10/08/2022 Wednesday at 07:05 AM 93 / 62      10/07/2022 Tuesday at 06:57 AM 107 / 66      10/06/2022 Monday at 06:51 AM 104 / 59      01'$ /03/2023 Sunday at 06:47 AM 95 / 50

## 2022-10-16 NOTE — Telephone Encounter (Signed)
As long as renal function is stable and patient asymptomatic, contineu present meds.

## 2022-11-24 ENCOUNTER — Other Ambulatory Visit: Payer: Self-pay | Admitting: Cardiology

## 2022-11-24 DIAGNOSIS — I739 Peripheral vascular disease, unspecified: Secondary | ICD-10-CM

## 2022-11-26 ENCOUNTER — Telehealth: Payer: Self-pay | Admitting: Cardiology

## 2022-11-26 DIAGNOSIS — K551 Chronic vascular disorders of intestine: Secondary | ICD-10-CM

## 2022-11-26 NOTE — Telephone Encounter (Signed)
Chief Complaint  Patient presents with   Weight Loss    Patient with excessive weight loss and abdominal pain Dr. [name]    Spoke to patient's daughter Hoyle Sauer, patient presently has been losing almost 1 pound of weight on a weekly basis, has become extremely weak and has now become wheelchair-bound.  He is also having significant amount of abdominal discomfort and abdominal pain even drinking a glass of water or eating anything.  Patient is presently in palliative care.  I discussed the CT angiogram findings from October 2023, found to have high-grade stenosis of mesenteric vessels and also bilateral renal arteries.  I discussed with Hoyle Sauer, her dad is next to her, they would like to proceed with peripheral arteriogram/mesenteric arteriogram.  Will try to avoid contrast on minimal use of contrast, will perform CO2 angiography.  I have expressed concern about embolic complications that could eventually lead to mortality in view of his advanced age and also significant atherosclerotic plaque that was noted by CT angiogram.  Patient and daughter both state that he is presently in palliative care, they understand the risk associated with this, but since he is in so much pain it would be more prudent for them to proceed with a procedure and they completely understand risks associated with peripheral arteriogram including but not limited to bleeding, renal failure, embolic complications and even death of 2 to 3% risk.  I will make further recommendation following angiography.  I also discussed with Dr. [name].    [name], MD, [facility] 11/26/2022, 2:36 PM Office: [phone] Fax: [phone] Pager: [phone]

## 2022-11-27 ENCOUNTER — Other Ambulatory Visit: Payer: Self-pay | Admitting: Cardiology

## 2022-11-27 DIAGNOSIS — I10 Essential (primary) hypertension: Secondary | ICD-10-CM

## 2022-11-28 ENCOUNTER — Encounter (INDEPENDENT_AMBULATORY_CARE_PROVIDER_SITE_OTHER): Payer: Medicare Other | Admitting: Ophthalmology

## 2023-01-15 ENCOUNTER — Encounter (INDEPENDENT_AMBULATORY_CARE_PROVIDER_SITE_OTHER): Payer: Medicare Other | Admitting: Ophthalmology

## 2023-01-15 DIAGNOSIS — I10 Essential (primary) hypertension: Secondary | ICD-10-CM

## 2023-01-15 DIAGNOSIS — H35033 Hypertensive retinopathy, bilateral: Secondary | ICD-10-CM

## 2023-01-15 DIAGNOSIS — E113313 Type 2 diabetes mellitus with moderate nonproliferative diabetic retinopathy with macular edema, bilateral: Secondary | ICD-10-CM

## 2023-01-15 DIAGNOSIS — Z961 Presence of intraocular lens: Secondary | ICD-10-CM

## 2023-01-29 ENCOUNTER — Other Ambulatory Visit: Payer: Self-pay | Admitting: Cardiology

## 2023-01-29 DIAGNOSIS — I739 Peripheral vascular disease, unspecified: Secondary | ICD-10-CM

## 2023-03-05 ENCOUNTER — Other Ambulatory Visit: Payer: Self-pay | Admitting: Cardiology

## 2023-03-05 ENCOUNTER — Ambulatory Visit: Payer: Medicare Other

## 2023-03-05 DIAGNOSIS — I6523 Occlusion and stenosis of bilateral carotid arteries: Secondary | ICD-10-CM

## 2023-03-05 DIAGNOSIS — I739 Peripheral vascular disease, unspecified: Secondary | ICD-10-CM

## 2023-03-16 ENCOUNTER — Encounter: Payer: Self-pay | Admitting: Cardiology

## 2023-03-16 ENCOUNTER — Ambulatory Visit: Payer: Medicare Other | Admitting: Cardiology

## 2023-03-16 VITALS — BP 130/65 | HR 70 | Ht 67.0 in | Wt 153.0 lb

## 2023-03-16 DIAGNOSIS — K551 Chronic vascular disorders of intestine: Secondary | ICD-10-CM

## 2023-03-16 DIAGNOSIS — I10 Essential (primary) hypertension: Secondary | ICD-10-CM

## 2023-03-16 DIAGNOSIS — I6523 Occlusion and stenosis of bilateral carotid arteries: Secondary | ICD-10-CM

## 2023-03-16 DIAGNOSIS — L97521 Non-pressure chronic ulcer of other part of left foot limited to breakdown of skin: Secondary | ICD-10-CM

## 2023-03-16 DIAGNOSIS — I951 Orthostatic hypotension: Secondary | ICD-10-CM

## 2023-03-16 MED ORDER — NITROGLYCERIN 0.2 MG/HR TD PT24
0.2000 mg | MEDICATED_PATCH | Freq: Every day | TRANSDERMAL | 3 refills | Status: DC
Start: 2023-03-16 — End: 2023-07-03

## 2023-03-16 MED ORDER — LABETALOL HCL 100 MG PO TABS: 100.0000 mg | ORAL_TABLET | Freq: Two times a day (BID) | ORAL | 2 refills | Status: DC

## 2023-03-16 MED ORDER — RIVAROXABAN 2.5 MG PO TABS
2.5000 mg | ORAL_TABLET | Freq: Two times a day (BID) | ORAL | 1 refills | Status: AC
Start: 2023-03-16 — End: ?

## 2023-03-16 MED ORDER — ASPIRIN 81 MG PO CHEW
81.0000 mg | CHEWABLE_TABLET | Freq: Every day | ORAL | Status: AC
Start: 2023-03-16 — End: ?

## 2023-03-16 NOTE — Progress Notes (Signed)
Primary Physician:  Estevan Oaks, NP   Patient ID: [name], male    DOB: [birth year], 89 y.o.   MRN: [id number]  Chief Complaint  Patient presents with   Hypertension   Results   HPI:    TOMOTHY OLIVOS  is a 89 y.o. male with history of CAD, S/P CABG in 2009, small vessel PAD, pseudoclaudication, type II diabetes with autonomic insufficiency and autonomic orthostatic hypotension, stage IIIa chronic kidney disease, primary hypertension, asymptomatic carotid stenosis, and hyperlipidemia.  He has patent graft except occluded SVG to RCA which is diffusely diseased small vessel and also severe below-knee bilateral lower disease by angiography on 11/04/2016 and mild small vessel peripheral arterial disease especially in the left leg angiography in April 2023.  Patient has chronic dizziness due to orthostatic hypotension.  Patient was evaluated by me for mesenteric ischemia about 3 months ago, as patient has not had any abdominal discomfort although he has been gradually losing weight, in view of his advanced age, renal dysfunction, we have decided to proceed with observation only.  Patient now presents for 30-month follow-up visit, admits to clipping for toenail done in the process developed ulceration about 5 to 6 months ago,  Past Medical History:  Diagnosis Date   Anxiety    Arthritis    Carotid arterial disease (HCC)    CKD (chronic kidney disease)    Coronary artery disease    Diabetes mellitus without complication (HCC)    Diabetic retinopathy (HCC)    NPDR OU   Diverticulitis    Dyspnea    GERD (gastroesophageal reflux disease)    History of kidney stones 2021   Hypercholesteremia    Hypertension    Hypertensive retinopathy    OU   Myocardial infarction [facility]) 2009   Pneumonia    as a child   Prostate cancer (HCC)    UTI (lower urinary tract infection)    Social History   Tobacco Use   Smoking status: Former    Packs/day: 0.25    Years: 2.00    Additional  pack years: 0.00    Total pack years: 0.50    Types: Cigarettes   Smokeless tobacco: Never   Tobacco comments:    when he was a teenage age [age]  Substance Use Topics   Alcohol use: No    Marital Status: Widowed  ROS   Review of Systems  Cardiovascular:  Positive for dyspnea on exertion. Negative for chest pain, claudication and leg swelling.  Gastrointestinal:  Positive for diarrhea.   Objective  Blood pressure 130/65, pulse 70, height 5\' 7"  (1.702 m), weight 153 lb (69.4 kg), SpO2 96 %. Body mass index is 23.96 kg/m.     03/16/2023    9:20 AM 03/16/2023    8:56 AM 09/03/2022    9:09 AM  Vitals with BMI  Height  5\' 7"  5\' 7"   Weight  153 lbs 159 lbs  BMI  23.96 24.9  Systolic 130 159 409  Diastolic 65 76 60  Pulse  70 67   Orthostatic VS for the past 72 hrs (Last 3 readings):  Patient Position BP Location Cuff Size  03/16/23 0920 Standing Left Arm --  03/16/23 0856 Sitting Left Arm Normal     Physical Exam Vitals reviewed.  Constitutional:      Appearance: He is well-developed.  Neck:     Vascular: Carotid bruit (bilateral) present. No JVD.  Cardiovascular:     Rate and Rhythm: Normal rate  and regular rhythm.     Pulses:          Femoral pulses are 2+ on the right side and 2+ on the left side.      Popliteal pulses are 1+ on the right side and 1+ on the left side.       Dorsalis pedis pulses are 0 on the right side and 0 on the left side.       Posterior tibial pulses are 0 on the right side and 0 on the left side.     Heart sounds: Normal heart sounds, S1 normal and S2 normal. No murmur heard.    No gallop.  Pulmonary:     Effort: Pulmonary effort is normal. No accessory muscle usage.     Breath sounds: Normal breath sounds.  Abdominal:     General: Bowel sounds are normal.     Palpations: Abdomen is soft.  Musculoskeletal:     Right lower leg: No edema.     Left lower leg: No edema.  Skin:    General: Skin is warm.     Findings: Wound (left greater  toe) present.    Laboratory examination:      Latest Ref Rng & Units 08/20/2022    8:27 AM 07/03/2022    9:30 AM 01/02/2022   10:18 AM  CMP  Glucose 70 - 99 mg/dL 161   096   BUN 8 - 27 mg/dL 31   28   Creatinine 0.45 - 1.27 mg/dL 4.09  8.11  9.14   Sodium 134 - 144 mmol/L 140   140   Potassium 3.5 - 5.2 mmol/L 5.3   5.0   Chloride 96 - 106 mmol/L 113   109   CO2 20 - 29 mmol/L 16   18   Calcium 8.6 - 10.2 mg/dL 9.4   9.5       Latest Ref Rng & Units 01/02/2022   10:18 AM 06/16/2021   12:57 AM 06/14/2021    1:14 PM  CBC  WBC 3.4 - 10.8 x10E3/uL 6.7  9.1  8.6   Hemoglobin 13.0 - 17.7 g/dL 78.2  95.6  21.3   Hematocrit 37.5 - 51.0 % 32.0  34.0  34.5   Platelets 150 - 450 x10E3/uL 177  187  198    HEMOGLOBIN A1C Lab Results  Component Value Date   HGBA1C 7.2 (H) 11/06/2020   MPG 159.94 11/06/2020   Lab Results  Component Value Date   TSH 1.235 06/15/2021    External labs:  01/31/2022: Total cholesterol 143, HDL 59, LDL 70, triglycerides 61  BUN 33, creatinine 1.43, GFR 48, sodium 141, potassium 5.0  Hgb 10.4, HCT 32.5, MCV 93.7, platelet 187  TSH 0.99  BNP 165  Vitamin D 36  Allergies   Allergies  Allergen Reactions   Contrast Media [Iodinated Contrast Media] Itching    PT STATES ALLERGY TO "CT DYE".   Pletal [Cilostazol] Diarrhea   Amlodipine Swelling    Leg swelling   Dye Fdc Red [Red Dye] Swelling    CAT scan dye   Ibuprofen Other (See Comments)    Upset stomach      Final Medications at End of Visit     Current Outpatient Medications:    Accu-Chek Softclix Lancets lancets, , Disp: , Rfl:    acetaminophen (TYLENOL) 500 MG tablet, Take 1,000 mg by mouth 2 (two) times daily as needed for mild pain., Disp: , Rfl:    aspirin (ASPIRIN CHILDRENS)  81 MG chewable tablet, Chew 1 tablet (81 mg total) by mouth daily., Disp: , Rfl:    Blood Glucose Monitoring Suppl (ACCU-CHEK GUIDE) w/Device KIT, , Disp: , Rfl:    cholecalciferol (VITAMIN D3) 25 MCG (1000  UNIT) tablet, Take 1,000 Units by mouth daily., Disp: , Rfl:    clonazePAM (KLONOPIN) 0.5 MG tablet, Take 0.5 mg by mouth daily as needed for anxiety., Disp: , Rfl:    DULoxetine (CYMBALTA) 30 MG capsule, Take 2 capsules by mouth once daily, Disp: 90 capsule, Rfl: 0   ezetimibe (ZETIA) 10 MG tablet, TAKE 1 TABLET DAILY AFTER SUPPER (Patient taking differently: Take 10 mg by mouth every evening.), Disp: 90 tablet, Rfl: 3   gabapentin (NEURONTIN) 300 MG capsule, Take 300 mg by mouth 3 (three) times daily., Disp: , Rfl:    hydrALAZINE (APRESOLINE) 25 MG tablet, Take 1 tablet (25 mg total) by mouth in the morning and at bedtime. Hold if standing BP less than 130/80 mm Hg, Disp: 180 tablet, Rfl: 1   HYDROcodone-acetaminophen (NORCO/VICODIN) 5-325 MG tablet, Take 1 tablet by mouth every 6 (six) hours as needed for moderate pain., Disp: , Rfl:    LANTUS SOLOSTAR 100 UNIT/ML Solostar Pen, Inject 10 Units into the skin 2 (two) times daily. Inject 10 units in the morning and 10 units in the evening., Disp: , Rfl:    linaclotide (LINZESS) 145 MCG CAPS capsule, Take 145 mcg by mouth daily as needed (constipation)., Disp: , Rfl:    nitroGLYCERIN (NITRODUR - DOSED IN MG/24 HR) 0.2 mg/hr patch, Place 1 patch (0.2 mg total) onto the skin daily., Disp: 30 patch, Rfl: 3   omeprazole (PRILOSEC) 40 MG capsule, Take 40 mg by mouth daily. , Disp: , Rfl:    Pyridoxine HCl (B-6 PO), Take 1 capsule by mouth daily., Disp: , Rfl:    rivaroxaban (XARELTO) 2.5 MG TABS tablet, Take 1 tablet (2.5 mg total) by mouth 2 (two) times daily., Disp: 180 tablet, Rfl: 1   ropinirole (REQUIP) 5 MG tablet, Take 5 mg by mouth at bedtime., Disp: , Rfl:    simvastatin (ZOCOR) 20 MG tablet, Take 20 mg by mouth at bedtime., Disp: , Rfl:    spironolactone-hydrochlorothiazide (ALDACTAZIDE) 25-25 MG tablet, TAKE 1/2 (ONE-HALF) TABLET BY MOUTH IN THE MORNING, Disp: 45 tablet, Rfl: 1   tamsulosin (FLOMAX) 0.4 MG CAPS capsule, Take 1 capsule (0.4 mg  total) by mouth daily after supper., Disp: 90 capsule, Rfl: 3   vitamin B-12 (CYANOCOBALAMIN) 500 MCG tablet, Take 500 mcg by mouth daily., Disp: , Rfl:    Vitamin E 268 MG (400 UNIT) CAPS, Take 400 Units by mouth daily., Disp: , Rfl:    ACCU-CHEK SMARTVIEW test strip, , Disp: , Rfl:    docusate sodium (COLACE) 50 MG capsule, Take 50 mg by mouth 2 (two) times daily., Disp: , Rfl:    labetalol (NORMODYNE) 100 MG tablet, Take 1 tablet (100 mg total) by mouth 2 (two) times daily., Disp: 180 tablet, Rfl: 2  Radiology:   CT Abdomen 07/03/2022: 1. High-grade stenosis of the superior mesenteric artery due to a combination of hard and soft plaque. There is also substantial atheromatous narrowing of both renal arteries proximally, likely with high-grade stenosis. There is also moderate atheromatous narrowing at the origin of the celiac trunk secondary to calcified atheromatous plaque. The inferior mesenteric artery is also heavily calcified but appears to opacify distal to this.   If the patient's weight loss is also associated with  postprandial abdominal pain, possibility of chronic mesenteric ischemia might be considered given the degree of atheromatous disease, although there is no overt vessel occlusion currently identified.  Cardiac Studies:   Coronary angiogram 11/04/2016: Native RCA small and diffusely diseased with distal 70-80% stenosis. Occluded SVG to RCA. Patent SVG to OM-RI, LIMA to LAD. Medical therapy.  Lower Extremity Arterial Duplex 11/30/2019:  No hemodynamically significant stenosis in bilateral lower extremity above  the knee.  Moderate velocity increase at the right mid posterior tibial artery.  suggests >50%stenosis.  Diffuse small vessel disease with moderately abnormal waveform right ankle  and severely abnormal waveform left ankle.  Non compressible vessels bilateral at the ankles suggests medial  calcinosis.    No significant change from 05/02/2019.  PCV MYOCARDIAL PERFUSION  WITH LEXISCAN 09/24/2020 Lexiscan nuclear stress test performed using 1-day protocol. SPECT images showed small sized, mild intensity, mildly reversible perfusion defect in apical to basal inferior myocardium. Stress LVEF 53%. Low risk study.  ABI 02/11/2021: Right PT monophasic and non-compressible, DP biphasic and noncompressible.  TBI 0.59.  Right great toe pressure 102 mmHg. Left PT monophasic and noncompressible, DP monophasic and noncompressible.  TBI 0.42.  Left great toe pressure 73 mmHg. Bilateral TBI abnormal. Bilateral TBI suggest appropriate pressure for wound healing.   Patient has had peripheral arteriogram on 01/03/2020 revealing widely patent large vessels and small vessel disease.  In the absence of open wounds or nonhealing foot ulcer and absence of rest pain, recommend medical therapy  Echocardiogram 06/15/2021: 1. Left ventricular ejection fraction, by estimation, is 60 to 65%. The  left ventricle has normal function. The left ventricle has no regional  wall motion abnormalities. There is severe left ventricular hypertrophy.  Left ventricular diastolic parameters  are consistent with Grade I diastolic dysfunction (impaired relaxation).   2. Right ventricular systolic function is normal. The right ventricular size is normal.   3. Left atrial size was mildly dilated.   4. The mitral valve is abnormal. Mild to moderate mitral valve regurgitation.   5. The aortic valve is tricuspid. Mild aortic valve sclerosis is present, with no evidence of  aortic valve stenosis.   6. The inferior vena cava is normal in size with greater than 50%  respiratory variability, suggesting right atrial pressure of 3 mmHg.   Comparison(s): No significant change from prior study. 01/11/2021: LVEF  60-65%, moderate LVH, grade 1 DD, moderate MR.   Peripheral arteriogram 01/14/2022: Abdominal aortogram reveals presence of 2 renal arteries 1 on either sides.  Widely patent.  Mild disease is evident.   Abdominal aorta itself does not reveal any abdominal aortic aneurysm or stenosis.  Aortoiliac bifurcation is widely patent.  Femoral arteries bilaterally were widely patent all the way up to the above-knee and below-knee.  No significant disease was evident.  There is three-vessel runoff noted below the bilateral knee, there is mild disease in the small vessels.  The small vessels were not well visualized due to use of CO2 only however patency of the AT, PT and peroneal vessels documented.  Mild disease to moderate disease in the left peroneal and anterior tibial vessel was evident.  Recommendation: Patient symptoms is pseudoclaudication not related to peripheral arterial disease.  Previously noted bilateral anterior tibial vessel occlusion is now widely patent, very mild disease is evident below the knee.  Medical therapy.  Evaluation for noncardiac causes of lower extremity weakness and pain indicated.  20 mL contrast utilized.   Carotid artery duplex 03/05/2023: Duplex suggests stenosis in  the right internal carotid artery (50-69%), upper end of the spectrum.  <50% stenosis in the right external carotid artery. Duplex suggests stenosis in the left internal carotid artery (1-15%). Antegrade right vertebral artery flow. Antegrade left vertebral artery flow. Overall no significant change from 07/23/2022. Follow up in six months is appropriate if clinically indicated.  EKG   EKG 03/16/2023: Normal sinus rhythm at rate of 68 bpm, LAE, normal axis, right bundle branch block.  Single PAC.  Compared to 07/23/2022, no significant change.   Assessment:     ICD-10-CM   1. Ulcer of left foot, limited to breakdown of skin (HCC)  L97.521 nitroGLYCERIN (NITRODUR - DOSED IN MG/24 HR) 0.2 mg/hr patch    ABI WITH/WO TBI    rivaroxaban (XARELTO) 2.5 MG TABS tablet    aspirin (ASPIRIN CHILDRENS) 81 MG chewable tablet    2. Chronic mesenteric ischemia (HCC)  K55.1 EKG 12-Lead    3. Asymptomatic bilateral  carotid artery stenosis  I65.23     4. Primary hypertension  I10 labetalol (NORMODYNE) 100 MG tablet    5. Orthostatic hypotension  I95.1       Meds ordered this encounter  Medications   nitroGLYCERIN (NITRODUR - DOSED IN MG/24 HR) 0.2 mg/hr patch    Sig: Place 1 patch (0.2 mg total) onto the skin daily.    Dispense:  30 patch    Refill:  3   labetalol (NORMODYNE) 100 MG tablet    Sig: Take 1 tablet (100 mg total) by mouth 2 (two) times daily.    Dispense:  180 tablet    Refill:  2   rivaroxaban (XARELTO) 2.5 MG TABS tablet    Sig: Take 1 tablet (2.5 mg total) by mouth 2 (two) times daily.    Dispense:  180 tablet    Refill:  1   aspirin (ASPIRIN CHILDRENS) 81 MG chewable tablet    Sig: Chew 1 tablet (81 mg total) by mouth daily.   Medications Discontinued During This Encounter  Medication Reason   colestipol (COLESTID) 5 g granules Completed Course   losartan (COZAAR) 50 MG tablet Patient Preference   isosorbide dinitrate (ISORDIL) 30 MG tablet Patient has not taken in last 30 days   labetalol (NORMODYNE) 100 MG tablet Reorder     Recommendations:   Roux Giesbrecht  is a 89 y.o. male with history of CAD, S/P CABG in 2009, small vessel PAD, pseudoclaudication, type II diabetes with autonomic insufficiency and autonomic orthostatic hypotension, primary hypertension, asymptomatic carotid stenosis, stage IIIa chronic kidney disease and hyperlipidemia.    1. Ulcer of left foot, limited to breakdown of skin [facility])   Patient admitted clipping for toenail done in the process developed ulceration about 5 to 6 months ago, presently the eschar appears dry and healing with secondary intention with no evidence of infection.  Also the ulceration appears to be superficial and very focal.  Would recommend continued medical therapy.  Nitroglycerin patch, patient has discontinued aspirin which was restarted and I will also start him on Xarelto 2.5 mg daily in view of severe small vessel  disease.  Unless the ulceration spreads or he starts noticing worsening pain, then will consider peripheral arteriogram.  I will obtain baseline ABI now.  - nitroGLYCERIN (NITRODUR - DOSED IN MG/24 HR) 0.2 mg/hr patch; Place 1 patch (0.2 mg total) onto the skin daily.  Dispense: 30 patch; Refill: 3 - ABI WITH/WO TBI; Future - rivaroxaban (XARELTO) 2.5 MG TABS tablet; Take 1 tablet (2.5 mg  total) by mouth 2 (two) times daily.  Dispense: 180 tablet; Refill: 1 - aspirin (ASPIRIN CHILDRENS) 81 MG chewable tablet; Chew 1 tablet (81 mg total) by mouth daily.  2. Chronic mesenteric ischemia (HCC) Patient has been gradually losing weight, has mesenteric stenosis, medical therapy for now after discussions, he is presently 89 years of age.  If indeed I decide to proceed with peripheral arteriogram, we may consider relook at mesenteric vessels as well. - EKG 12-Lead  3. Asymptomatic bilateral carotid artery stenosis I reviewed the results of the carotid artery stenosis.  No further evaluation be done in view of his advanced age and multiple medical comorbidity.  Carotid stenosis has remained stable.  He is presently on a statin.  4. Primary hypertension Blood pressure is well-controlled, I renewed his labetalol. - labetalol (NORMODYNE) 100 MG tablet; Take 1 tablet (100 mg total) by mouth 2 (two) times daily.  Dispense: 180 tablet; Refill: 2  5. Orthostatic hypotension Patient also has orthostatic hypotension and supine hypertension, blood pressure needs to be checked standing.  Blood pressure is well-controlled on standing.  Office visit in 3 weeks to follow-up on the wound.  Other orders - gabapentin (NEURONTIN) 300 MG capsule; Take 300 mg by mouth 3 (three) times daily. - ropinirole (REQUIP) 5 MG tablet; Take 5 mg by mouth at bedtime. - HYDROcodone-acetaminophen (NORCO/VICODIN) 5-325 MG tablet; Take 1 tablet by mouth every 6 (six) hours as needed for moderate pain.    Yates Decamp, MD,  [facility] 03/16/2023, 9:36 AM Office: [phone] Fax: [phone] Pager: [phone]

## 2023-03-19 ENCOUNTER — Ambulatory Visit (HOSPITAL_COMMUNITY)
Admission: RE | Admit: 2023-03-19 | Discharge: 2023-03-19 | Disposition: A | Discharge status: Home / Self Care | Source: Ambulatory Visit | Attending: Cardiology | Admitting: Cardiology

## 2023-03-19 DIAGNOSIS — L97521 Non-pressure chronic ulcer of other part of left foot limited to breakdown of skin: Secondary | ICD-10-CM | POA: Diagnosis present

## 2023-03-19 LAB — VAS US ABI WITH/WO TBI

## 2023-03-19 NOTE — Progress Notes (Signed)
ABI 03/19/2023: Right ABI noncompressible.  Abnormal monophasic waveform in PT and DP.  TBI 0.51 abnormal. Left ABI noncompressible.  Abnormal monophasic waveform in PT and DP.  TBI abnormal: Absent Compared to prior study 02/11/2021, left ABI was 0.42.  This represents inadequate perfusion for healing.

## 2023-03-19 NOTE — Progress Notes (Signed)
ABI 03/19/2023: Right ABI noncompressible.  Abnormal monophasic waveform in PT and DP.  TBI 0.51 abnormal. Left ABI noncompressible.  Abnormal monophasic waveform in PT and DP.  TBI abnormal at the left big toe. Compared to prior study 02/11/2021, left TBI was 0.42.  This represents inadequate perfusion for healing at the big toe.

## 2023-04-06 ENCOUNTER — Other Ambulatory Visit: Payer: Self-pay | Admitting: Cardiology

## 2023-04-06 DIAGNOSIS — I739 Peripheral vascular disease, unspecified: Secondary | ICD-10-CM

## 2023-04-14 ENCOUNTER — Other Ambulatory Visit: Payer: Self-pay | Admitting: Cardiology

## 2023-04-14 DIAGNOSIS — N401 Enlarged prostate with lower urinary tract symptoms: Secondary | ICD-10-CM

## 2023-04-14 DIAGNOSIS — I10 Essential (primary) hypertension: Secondary | ICD-10-CM

## 2023-04-14 DIAGNOSIS — I739 Peripheral vascular disease, unspecified: Secondary | ICD-10-CM

## 2023-04-22 ENCOUNTER — Other Ambulatory Visit: Payer: Self-pay | Admitting: Cardiology

## 2023-04-22 DIAGNOSIS — I10 Essential (primary) hypertension: Secondary | ICD-10-CM

## 2023-04-28 ENCOUNTER — Ambulatory Visit: Payer: Medicare Other | Admitting: Cardiology

## 2023-04-28 ENCOUNTER — Encounter: Payer: Self-pay | Admitting: Cardiology

## 2023-04-28 VITALS — BP 139/62 | HR 74 | Resp 16 | Ht 67.0 in | Wt 159.8 lb

## 2023-04-28 DIAGNOSIS — K551 Chronic vascular disorders of intestine: Secondary | ICD-10-CM

## 2023-04-28 DIAGNOSIS — L97521 Non-pressure chronic ulcer of other part of left foot limited to breakdown of skin: Secondary | ICD-10-CM

## 2023-04-28 NOTE — Progress Notes (Signed)
Primary Physician:  Estevan Oaks, NP   Patient ID: Justin [name], male    DOB: [birth year], 89 y.o.   MRN: [id number]  Chief Complaint  Patient presents with   Primary hypertension   Claudication in peripheral vascular disease    Follow-up    6 weeks   HPI:    Justin [name]  is a 89 y.o. male with history of CAD, S/P CABG in 2009, small vessel PAD, pseudoclaudication, type II diabetes with autonomic insufficiency and autonomic orthostatic hypotension, stage IIIa chronic kidney disease, primary hypertension, asymptomatic carotid stenosis, and hyperlipidemia.  He has patent graft except occluded SVG to RCA which is diffusely diseased small vessel and also severe below-knee bilateral lower disease by angiography on 11/04/2016 and mild small vessel peripheral arterial disease especially in the left leg angiography in April 2023.  Patient has chronic dizziness due to orthostatic hypotension, chronic mesenteric ischemia, renal dysfunction, developed ulcer after clipping for toenail done in the process developed ulceration about 5 to 6 months ago.  I had seen him 6 weeks ago and started him on aspirin again and also placed him on Xarelto.  He now presents for follow-up.  Also applied nitroglycerin patch.  Since making changes in his medications, no change in his ulceration, it has not expanded, there is no discharge.  Pain is improved.  His appetite has also improved.  Past Medical History:  Diagnosis Date   Anxiety    Arthritis    Carotid arterial disease (HCC)    CKD (chronic kidney disease)    Coronary artery disease    Diabetes mellitus without complication (HCC)    Diabetic retinopathy (HCC)    NPDR OU   Diverticulitis    Dyspnea    GERD (gastroesophageal reflux disease)    History of kidney stones 2021   Hypercholesteremia    Hypertension    Hypertensive retinopathy    OU   Myocardial infarction [facility]) 2009   Pneumonia    as a child   Prostate cancer (HCC)    UTI  (lower urinary tract infection)    Social History   Tobacco Use   Smoking status: Former    Current packs/day: 0.25    Average packs/day: 0.3 packs/day for 2.0 years (0.5 ttl pk-yrs)    Types: Cigarettes   Smokeless tobacco: Never   Tobacco comments:    when he was a teenage age [age]  Substance Use Topics   Alcohol use: No    Marital Status: Widowed  ROS   Review of Systems  Cardiovascular:  Positive for dyspnea on exertion. Negative for chest pain, claudication and leg swelling.  Gastrointestinal:  Negative for diarrhea.   Objective  Blood pressure 139/62, pulse 74, resp. rate 16, height 5\' 7"  (1.702 m), weight 159 lb 12.8 oz (72.5 kg), SpO2 98%. Body mass index is 25.03 kg/m.     04/28/2023    2:22 PM 03/16/2023    9:20 AM 03/16/2023    8:56 AM  Vitals with BMI  Height 5\' 7"   5\' 7"   Weight 159 lbs 13 oz  153 lbs  BMI 25.02  23.96  Systolic 139 130 409  Diastolic 62 65 76  Pulse 74  70   Orthostatic VS for the past 72 hrs (Last 3 readings):  Patient Position BP Location Cuff Size  04/28/23 1422 Sitting Left Arm Normal     Physical Exam Vitals reviewed.  Constitutional:      Appearance: He is well-developed.  Neck:     Vascular: Carotid bruit (bilateral) present. No JVD.  Cardiovascular:     Rate and Rhythm: Normal rate and regular rhythm.     Pulses:          Femoral pulses are 2+ on the right side and 2+ on the left side.      Popliteal pulses are 1+ on the right side and 1+ on the left side.       Dorsalis pedis pulses are 0 on the right side and 0 on the left side.       Posterior tibial pulses are 0 on the right side and 0 on the left side.     Heart sounds: Normal heart sounds, S1 normal and S2 normal. No murmur heard.    No gallop.  Pulmonary:     Effort: Pulmonary effort is normal. No accessory muscle usage.     Breath sounds: Normal breath sounds.  Abdominal:     General: Bowel sounds are normal.     Palpations: Abdomen is soft.   Musculoskeletal:     Right lower leg: No edema.     Left lower leg: No edema.  Skin:    General: Skin is warm.     Findings: Wound (left greater toe) present.    Laboratory examination:      Latest Ref Rng & Units 08/20/2022    8:27 AM 07/03/2022    9:30 AM 01/02/2022   10:18 AM  CMP  Glucose 70 - 99 mg/dL 644   034   BUN 8 - 27 mg/dL 31   28   Creatinine 7.42 - 1.27 mg/dL 5.95  6.38  7.56   Sodium 134 - 144 mmol/L 140   140   Potassium 3.5 - 5.2 mmol/L 5.3   5.0   Chloride 96 - 106 mmol/L 113   109   CO2 20 - 29 mmol/L 16   18   Calcium 8.6 - 10.2 mg/dL 9.4   9.5       Latest Ref Rng & Units 01/02/2022   10:18 AM 06/16/2021   12:57 AM 06/14/2021    1:14 PM  CBC  WBC 3.4 - 10.8 x10E3/uL 6.7  9.1  8.6   Hemoglobin 13.0 - 17.7 g/dL 43.3  29.5  18.8   Hematocrit 37.5 - 51.0 % 32.0  34.0  34.5   Platelets 150 - 450 x10E3/uL 177  187  198    HEMOGLOBIN A1C Lab Results  Component Value Date   HGBA1C 7.2 (H) 11/06/2020   MPG 159.94 11/06/2020   Lab Results  Component Value Date   TSH 1.235 06/15/2021    External labs:  01/31/2022: Total cholesterol 143, HDL 59, LDL 70, triglycerides 61  BUN 33, creatinine 1.43, GFR 48, sodium 141, potassium 5.0  Hgb 10.4, HCT 32.5, MCV 93.7, platelet 187  TSH 0.99  BNP 165  Vitamin D 36  Allergies   Allergies  Allergen Reactions   Contrast Media [Iodinated Contrast Media] Itching    PT STATES ALLERGY TO "CT DYE".   Pletal [Cilostazol] Diarrhea   Amlodipine Swelling    Leg swelling   Dye Fdc Red [Red Dye] Swelling    CAT scan dye   Ibuprofen Other (See Comments)    Upset stomach      Final Medications at End of Visit     Current Outpatient Medications:    ACCU-CHEK SMARTVIEW test strip, , Disp: , Rfl:    Accu-Chek Softclix Lancets lancets, ,  Disp: , Rfl:    acetaminophen (TYLENOL) 500 MG tablet, Take 1,000 mg by mouth 2 (two) times daily as needed for mild pain., Disp: , Rfl:    aspirin (ASPIRIN CHILDRENS) 81 MG  chewable tablet, Chew 1 tablet (81 mg total) by mouth daily., Disp: , Rfl:    Blood Glucose Monitoring Suppl (ACCU-CHEK GUIDE) w/Device KIT, , Disp: , Rfl:    cholecalciferol (VITAMIN D3) 25 MCG (1000 UNIT) tablet, Take 1,000 Units by mouth daily., Disp: , Rfl:    clonazePAM (KLONOPIN) 0.5 MG tablet, Take 0.5 mg by mouth daily as needed for anxiety., Disp: , Rfl:    DULoxetine (CYMBALTA) 30 MG capsule, Take 2 capsules by mouth once daily, Disp: 90 capsule, Rfl: 0   ezetimibe (ZETIA) 10 MG tablet, TAKE 1 TABLET DAILY AFTER SUPPER (Patient taking differently: Take 10 mg by mouth every evening.), Disp: 90 tablet, Rfl: 3   furosemide (LASIX) 40 MG tablet, Take 40 mg by mouth., Disp: , Rfl:    gabapentin (NEURONTIN) 300 MG capsule, Take 300 mg by mouth 3 (three) times daily., Disp: , Rfl:    HYDROcodone-acetaminophen (NORCO/VICODIN) 5-325 MG tablet, Take 1 tablet by mouth every 6 (six) hours as needed for moderate pain., Disp: , Rfl:    labetalol (NORMODYNE) 100 MG tablet, Take 1 tablet (100 mg total) by mouth 2 (two) times daily., Disp: 180 tablet, Rfl: 2   LANTUS SOLOSTAR 100 UNIT/ML Solostar Pen, Inject 10 Units into the skin 2 (two) times daily. Inject 10 units in the morning and 10 units in the evening., Disp: , Rfl:    meclizine (ANTIVERT) 25 MG tablet, Take 25 mg by mouth 2 (two) times daily., Disp: , Rfl:    nitroGLYCERIN (NITRODUR - DOSED IN MG/24 HR) 0.2 mg/hr patch, Place 1 patch (0.2 mg total) onto the skin daily., Disp: 30 patch, Rfl: 3   omeprazole (PRILOSEC) 40 MG capsule, Take 40 mg by mouth daily. , Disp: , Rfl:    Pyridoxine HCl (B-6 PO), Take 1 capsule by mouth daily., Disp: , Rfl:    rivaroxaban (XARELTO) 2.5 MG TABS tablet, Take 1 tablet (2.5 mg total) by mouth 2 (two) times daily., Disp: 180 tablet, Rfl: 1   ropinirole (REQUIP) 5 MG tablet, Take 5 mg by mouth at bedtime., Disp: , Rfl:    simvastatin (ZOCOR) 20 MG tablet, Take 20 mg by mouth at bedtime., Disp: , Rfl:     spironolactone-hydrochlorothiazide (ALDACTAZIDE) 25-25 MG tablet, TAKE 1/2 (ONE-HALF) TABLET BY MOUTH IN THE MORNING, Disp: 45 tablet, Rfl: 0   tamsulosin (FLOMAX) 0.4 MG CAPS capsule, TAKE 1 CAPSULE BY MOUTH ONCE DAILY AFTER SUPPER, Disp: 90 capsule, Rfl: 0   vitamin B-12 (CYANOCOBALAMIN) 500 MCG tablet, Take 500 mcg by mouth daily., Disp: , Rfl:    Vitamin E 268 MG (400 UNIT) CAPS, Take 400 Units by mouth daily., Disp: , Rfl:    docusate sodium (COLACE) 50 MG capsule, Take 50 mg by mouth 2 (two) times daily. (Patient not taking: Reported on 04/28/2023), Disp: , Rfl:   Radiology:   CT Abdomen 07/03/2022: 1. High-grade stenosis of the superior mesenteric artery due to a combination of hard and soft plaque. There is also substantial atheromatous narrowing of both renal arteries proximally, likely with high-grade stenosis. There is also moderate atheromatous narrowing at the origin of the celiac trunk secondary to calcified atheromatous plaque. The inferior mesenteric artery is also heavily calcified but appears to opacify distal to this.   If the  patient's weight loss is also associated with postprandial abdominal pain, possibility of chronic mesenteric ischemia might be considered given the degree of atheromatous disease, although there is no overt vessel occlusion currently identified.  Cardiac Studies:   Coronary angiogram 11/04/2016: Native RCA small and diffusely diseased with distal 70-80% stenosis. Occluded SVG to RCA. Patent SVG to OM-RI, LIMA to LAD. Medical therapy.  PCV MYOCARDIAL PERFUSION WITH LEXISCAN 09/24/2020 Lexiscan nuclear stress test performed using 1-day protocol. SPECT images showed small sized, mild intensity, mildly reversible perfusion defect in apical to basal inferior myocardium. Stress LVEF 53%. Low risk study.   Patient has had peripheral arteriogram on 01/03/2020 revealing widely patent large vessels and small vessel disease.  In the absence of open wounds or  nonhealing foot ulcer and absence of rest pain, recommend medical therapy  Echocardiogram 06/15/2021: 1. Left ventricular ejection fraction, by estimation, is 60 to 65%. The  left ventricle has normal function. The left ventricle has no regional  wall motion abnormalities. There is severe left ventricular hypertrophy.  Left ventricular diastolic parameters  are consistent with Grade I diastolic dysfunction (impaired relaxation).   2. Right ventricular systolic function is normal. The right ventricular size is normal.   3. Left atrial size was mildly dilated.   4. The mitral valve is abnormal. Mild to moderate mitral valve regurgitation.   5. The aortic valve is tricuspid. Mild aortic valve sclerosis is present, with no evidence of  aortic valve stenosis.   6. The inferior vena cava is normal in size with greater than 50%  respiratory variability, suggesting right atrial pressure of 3 mmHg.   Comparison(s): No significant change from prior study. 01/11/2021: LVEF  60-65%, moderate LVH, grade 1 DD, moderate MR.   Peripheral arteriogram 01/14/2022: Abdominal aortogram reveals presence of 2 renal arteries 1 on either sides.  Widely patent.  Mild disease is evident.  Abdominal aorta itself does not reveal any abdominal aortic aneurysm or stenosis.  Aortoiliac bifurcation is widely patent.  Femoral arteries bilaterally were widely patent all the way up to the above-knee and below-knee.  No significant disease was evident.  There is three-vessel runoff noted below the bilateral knee, there is mild disease in the small vessels.  The small vessels were not well visualized due to use of CO2 only however patency of the AT, PT and peroneal vessels documented.  Mild disease to moderate disease in the left peroneal and anterior tibial vessel was evident.  Recommendation: Patient symptoms is pseudoclaudication not related to peripheral arterial disease.  Previously noted bilateral anterior tibial vessel  occlusion is now widely patent, very mild disease is evident below the knee.  Medical therapy.  Evaluation for noncardiac causes of lower extremity weakness and pain indicated.  20 mL contrast utilized.   Carotid artery duplex 03/05/2023: Duplex suggests stenosis in the right internal carotid artery (50-69%), upper end of the spectrum.  <50% stenosis in the right external carotid artery. Duplex suggests stenosis in the left internal carotid artery (1-15%). Antegrade right vertebral artery flow. Antegrade left vertebral artery flow. Overall no significant change from 07/23/2022. Follow up in six months is appropriate if clinically indicated.  ABI 03/19/2023: Right ABI noncompressible.  Abnormal monophasic waveform in PT and DP.  TBI 0.51 abnormal. Left ABI noncompressible.  Abnormal monophasic waveform in PT and DP.  TBI abnormal at the left big toe. Compared to prior study 02/11/2021, left TBI was 0.42.  This represents inadequate perfusion for healing at the big toe. No significant change  from 02/11/2021.  EKG   EKG 03/16/2023: Normal sinus rhythm at rate of 68 bpm, LAE, normal axis, right bundle branch block.  Single PAC.  Compared to 07/23/2022, no significant change.   Assessment:   No diagnosis found.   No orders of the defined types were placed in this encounter.  Medications Discontinued During This Encounter  Medication Reason   isosorbide dinitrate (ISORDIL) 30 MG tablet    linaclotide (LINZESS) 145 MCG CAPS capsule    hydrALAZINE (APRESOLINE) 25 MG tablet     Recommendations:   Derringer Newcome  is a 89 y.o. male with history of CAD, S/P CABG in 2009, small vessel PAD, pseudoclaudication, type II diabetes with autonomic insufficiency and autonomic orthostatic hypotension, primary hypertension, asymptomatic carotid stenosis, stage IIIa chronic kidney disease and hyperlipidemia.    1. Ulcer of left foot, limited to breakdown of skin (HCC)   03/16/2023 photograph comparison to  today 04/28/2023.  Patient's ulceration is clearly demarcated well, there is no discharge, it is completely dry gangrene.  His symptoms of pain is also improved.  Leg edema persist.  He is presently on Xarelto and aspirin, his foot is now very warm.  He has also noticed improvement in symptoms of mesenteric ischemia and abdominal cramps after eating, he has gained about 2 pounds in weight.  Hence for now continue the same along with nitroglycerin patch.   2. Chronic mesenteric ischemia (HCC) Patient's mesenteric ischemia has also stabilized since being on Xarelto 2.5 mg p.o. twice daily.  Continue present management.  He was having frequent loose stools this is also now resolved.  I would like to see him back in 6 months, sooner if ulceration gets worse or if he develops any other complication.  ABI reveals significant abnormality that suggest nonhealing ulceration however clinically he is healing well and there is no evidence of limb threatening ischemia, his foot and also all the other rest of the toes except at the ulceration and gangrenous portion is very nice and warm.  Capillary refill is <2 seconds.   Yates Decamp, MD, [facility] 04/28/2023, 2:39 PM Office: [phone] Fax: [phone] Pager: [phone]

## 2023-04-29 ENCOUNTER — Ambulatory Visit: Payer: Medicare Other | Admitting: Cardiology

## 2023-05-03 ENCOUNTER — Other Ambulatory Visit: Payer: Self-pay | Admitting: Cardiology

## 2023-05-03 DIAGNOSIS — I10 Essential (primary) hypertension: Secondary | ICD-10-CM

## 2023-05-13 NOTE — Telephone Encounter (Signed)
done

## 2023-05-22 ENCOUNTER — Encounter: Payer: Self-pay | Admitting: Cardiology

## 2023-05-22 ENCOUNTER — Ambulatory Visit: Payer: Medicare Other | Admitting: Cardiology

## 2023-05-22 ENCOUNTER — Ambulatory Visit: Admission: RE | Admit: 2023-05-22 | Payer: Medicare Other | Source: Ambulatory Visit

## 2023-05-22 VITALS — BP 144/61 | HR 65 | Resp 16 | Ht 67.0 in | Wt 157.6 lb

## 2023-05-22 DIAGNOSIS — R234 Changes in skin texture: Secondary | ICD-10-CM

## 2023-05-22 DIAGNOSIS — L97521 Non-pressure chronic ulcer of other part of left foot limited to breakdown of skin: Secondary | ICD-10-CM

## 2023-05-22 DIAGNOSIS — I951 Orthostatic hypotension: Secondary | ICD-10-CM

## 2023-05-22 MED ORDER — LIDOCAINE-BENZALKONIUM 4-0.13 % EX GEL
1.0000 | Freq: Four times a day (QID) | CUTANEOUS | 1 refills | Status: AC
Start: 2023-05-22 — End: ?

## 2023-05-22 NOTE — Progress Notes (Unsigned)
Primary Physician:  Estevan Oaks, NP   Patient ID: Justin [name], male    DOB: 01/15/1933, 89 y.o.   MRN: [id number]  Chief Complaint  Patient presents with   Primary hypertension   Dizziness   HPI:    Justin [name]  is a 89 y.o. male with history of CAD, S/P CABG in 2009, small vessel PAD, pseudoclaudication, type II diabetes with autonomic insufficiency and autonomic orthostatic hypotension, stage IIIa chronic kidney disease, primary hypertension, asymptomatic carotid stenosis, and hyperlipidemia.  He has patent graft except occluded SVG to RCA which is diffusely diseased small vessel and also severe below-knee bilateral lower disease by angiography on 11/04/2016 and mild small vessel peripheral arterial disease especially in the left leg angiography in April 2023.  Patient has chronic dizziness due to orthostatic hypotension, chronic mesenteric ischemia stable since being on Xarelto, stage IIIa-B chronic kidney disease due to diabetes mellitus, developed ulcer after clipping for toenail done in the process developed ulceration about a year ago.    He made an urgent appointment with me as he was evaluated by his podiatrist and was recommended to go to the emergency room for amputation.  He has no new symptoms, has eschar on his left greater toe.  No fever, no chills.  He had minimal discharge and was started on antibiotic course and there is no further discharge.  He is accompanied by his daughter.   Past Medical History:  Diagnosis Date   Anxiety    Arthritis    Carotid arterial disease (HCC)    CKD (chronic kidney disease)    Coronary artery disease    Diabetes mellitus without complication (HCC)    Diabetic retinopathy (HCC)    NPDR OU   Diverticulitis    Dyspnea    GERD (gastroesophageal reflux disease)    History of kidney stones 2021   Hypercholesteremia    Hypertension    Hypertensive retinopathy    OU   Myocardial infarction [facility]) 2009   Pneumonia    as a  child   Prostate cancer (HCC)    UTI (lower urinary tract infection)    Social History   Tobacco Use   Smoking status: Former    Current packs/day: 0.25    Average packs/day: 0.3 packs/day for 2.0 years (0.5 ttl pk-yrs)    Types: Cigarettes   Smokeless tobacco: Never   Tobacco comments:    when he was a teenage age [age]  Substance Use Topics   Alcohol use: No    Marital Status: Widowed  ROS   Review of Systems  Cardiovascular:  Negative for chest pain, claudication, dyspnea on exertion and leg swelling.  Gastrointestinal:  Negative for diarrhea.   Objective  Blood pressure (!) 144/61, pulse 65, resp. rate 16, height 5\' 7"  (1.702 m), weight 157 lb 9.6 oz (71.5 kg), SpO2 98%. Body mass index is 24.68 kg/m.     05/22/2023   11:36 AM 04/28/2023    2:22 PM 03/16/2023    9:20 AM  Vitals with BMI  Height 5\' 7"  5\' 7"    Weight 157 lbs 10 oz 159 lbs 13 oz   BMI 24.68 25.02   Systolic 144 139 784  Diastolic 61 62 65  Pulse 65 74    Orthostatic VS for the past 72 hrs (Last 3 readings):  Orthostatic BP Patient Position BP Location Cuff Size Orthostatic Pulse  05/22/23 1144 108/54 Standing Left Arm Normal 73  05/22/23 1141 142/59 Sitting Left Arm  Normal 63  05/22/23 1136 -- Sitting Left Arm Normal --     Physical Exam Vitals reviewed.  Constitutional:      Appearance: He is well-developed.  Neck:     Vascular: Carotid bruit (bilateral) present. No JVD.  Cardiovascular:     Rate and Rhythm: Normal rate and regular rhythm.     Pulses:          Femoral pulses are 2+ on the right side and 2+ on the left side.      Popliteal pulses are 1+ on the right side and 1+ on the left side.       Dorsalis pedis pulses are 0 on the right side and 0 on the left side.       Posterior tibial pulses are 0 on the right side and 0 on the left side.     Heart sounds: Normal heart sounds, S1 normal and S2 normal. No murmur heard.    No gallop.  Pulmonary:     Effort: Pulmonary effort is  normal. No accessory muscle usage.     Breath sounds: Normal breath sounds.  Abdominal:     General: Bowel sounds are normal.     Palpations: Abdomen is soft.  Musculoskeletal:     Right lower leg: No edema.     Left lower leg: No edema.  Skin:    General: Skin is warm.     Comments: left greater toe tip has eschar, dry    Laboratory examination:      Latest Ref Rng & Units 08/20/2022    8:27 AM 07/03/2022    9:30 AM 01/02/2022   10:18 AM  CMP  Glucose 70 - 99 mg/dL 425   956   BUN 8 - 27 mg/dL 31   28   Creatinine 3.87 - 1.27 mg/dL 5.64  3.32  9.51   Sodium 134 - 144 mmol/L 140   140   Potassium 3.5 - 5.2 mmol/L 5.3   5.0   Chloride 96 - 106 mmol/L 113   109   CO2 20 - 29 mmol/L 16   18   Calcium 8.6 - 10.2 mg/dL 9.4   9.5       Latest Ref Rng & Units 01/02/2022   10:18 AM 06/16/2021   12:57 AM 06/14/2021    1:14 PM  CBC  WBC 3.4 - 10.8 x10E3/uL 6.7  9.1  8.6   Hemoglobin 13.0 - 17.7 g/dL 88.4  16.6  06.3   Hematocrit 37.5 - 51.0 % 32.0  34.0  34.5   Platelets 150 - 450 x10E3/uL 177  187  198    HEMOGLOBIN A1C Lab Results  Component Value Date   HGBA1C 7.2 (H) 11/06/2020   MPG 159.94 11/06/2020   Lab Results  Component Value Date   TSH 1.235 06/15/2021    External labs:  01/31/2022:  Total cholesterol 143, HDL 59, LDL 70, triglycerides 61  BUN 33, creatinine 1.43, GFR 48, sodium 141, potassium 5.0  Hgb 10.4, HCT 32.5, MCV 93.7, platelet 187  TSH 0.99  BNP 165  Vitamin D 36  Allergies   Allergies  Allergen Reactions   Contrast Media [Iodinated Contrast Media] Itching    PT STATES ALLERGY TO "CT DYE".   Pletal [Cilostazol] Diarrhea   Amlodipine Swelling    Leg swelling   Dye Fdc Red [Red Dye #40 (Allura Red)] Swelling    CAT scan dye   Ibuprofen Other (See Comments)    Upset stomach  Final Medications at End of Visit     Current Outpatient Medications:    ACCU-CHEK SMARTVIEW test strip, , Disp: , Rfl:    Accu-Chek Softclix Lancets  lancets, , Disp: , Rfl:    acetaminophen (TYLENOL) 500 MG tablet, Take 1,000 mg by mouth 2 (two) times daily as needed for mild pain., Disp: , Rfl:    aspirin (ASPIRIN CHILDRENS) 81 MG chewable tablet, Chew 1 tablet (81 mg total) by mouth daily., Disp: , Rfl:    Blood Glucose Monitoring Suppl (ACCU-CHEK GUIDE) w/Device KIT, , Disp: , Rfl:    cholecalciferol (VITAMIN D3) 25 MCG (1000 UNIT) tablet, Take 1,000 Units by mouth daily., Disp: , Rfl:    clonazePAM (KLONOPIN) 0.5 MG tablet, Take 0.5 mg by mouth daily as needed for anxiety., Disp: , Rfl:    docusate sodium (COLACE) 50 MG capsule, Take 50 mg by mouth 2 (two) times daily., Disp: , Rfl:    DULoxetine (CYMBALTA) 30 MG capsule, Take 2 capsules by mouth once daily, Disp: 90 capsule, Rfl: 0   ezetimibe (ZETIA) 10 MG tablet, TAKE 1 TABLET DAILY AFTER SUPPER (Patient taking differently: Take 10 mg by mouth every evening.), Disp: 90 tablet, Rfl: 3   furosemide (LASIX) 40 MG tablet, Take 40 mg by mouth., Disp: , Rfl:    gabapentin (NEURONTIN) 300 MG capsule, Take 300 mg by mouth 3 (three) times daily., Disp: , Rfl:    HYDROcodone-acetaminophen (NORCO/VICODIN) 5-325 MG tablet, Take 1 tablet by mouth every 6 (six) hours as needed for moderate pain., Disp: , Rfl:    labetalol (NORMODYNE) 100 MG tablet, Take 1 tablet (100 mg total) by mouth 2 (two) times daily., Disp: 180 tablet, Rfl: 2   LANTUS SOLOSTAR 100 UNIT/ML Solostar Pen, Inject 10 Units into the skin 2 (two) times daily. Inject 10 units in the morning and 10 units in the evening., Disp: , Rfl:    Lidocaine-Benzalkonium 4-0.13 % GEL, Apply 1 Pump topically in the morning, at noon, in the evening, and at bedtime., Disp: 946 each, Rfl: 1   meclizine (ANTIVERT) 25 MG tablet, Take 25 mg by mouth 2 (two) times daily., Disp: , Rfl:    nitroGLYCERIN (NITRODUR - DOSED IN MG/24 HR) 0.2 mg/hr patch, Place 1 patch (0.2 mg total) onto the skin daily., Disp: 30 patch, Rfl: 3   omeprazole (PRILOSEC) 40 MG  capsule, Take 40 mg by mouth daily. , Disp: , Rfl:    Pyridoxine HCl (B-6 PO), Take 1 capsule by mouth daily., Disp: , Rfl:    rivaroxaban (XARELTO) 2.5 MG TABS tablet, Take 1 tablet (2.5 mg total) by mouth 2 (two) times daily., Disp: 180 tablet, Rfl: 1   ropinirole (REQUIP) 5 MG tablet, Take 5 mg by mouth at bedtime., Disp: , Rfl:    simvastatin (ZOCOR) 20 MG tablet, Take 20 mg by mouth at bedtime., Disp: , Rfl:    spironolactone-hydrochlorothiazide (ALDACTAZIDE) 25-25 MG tablet, TAKE 1/2 (ONE-HALF) TABLET BY MOUTH IN THE MORNING, Disp: 45 tablet, Rfl: 0   tamsulosin (FLOMAX) 0.4 MG CAPS capsule, TAKE 1 CAPSULE BY MOUTH ONCE DAILY AFTER SUPPER, Disp: 90 capsule, Rfl: 0   vitamin B-12 (CYANOCOBALAMIN) 500 MCG tablet, Take 500 mcg by mouth daily., Disp: , Rfl:    Vitamin E 268 MG (400 UNIT) CAPS, Take 400 Units by mouth daily., Disp: , Rfl:   Radiology:   CT Abdomen 07/03/2022: 1. High-grade stenosis of the superior mesenteric artery due to a combination of hard and soft plaque.  There is also substantial atheromatous narrowing of both renal arteries proximally, likely with high-grade stenosis. There is also moderate atheromatous narrowing at the origin of the celiac trunk secondary to calcified atheromatous plaque. The inferior mesenteric artery is also heavily calcified but appears to opacify distal to this.   If the patient's weight loss is also associated with postprandial abdominal pain, possibility of chronic mesenteric ischemia might be considered given the degree of atheromatous disease, although there is no overt vessel occlusion currently identified.  Cardiac Studies:   Coronary angiogram 11/04/2016: Native RCA small and diffusely diseased with distal 70-80% stenosis. Occluded SVG to RCA. Patent SVG to OM-RI, LIMA to LAD. Medical therapy.  PCV MYOCARDIAL PERFUSION WITH LEXISCAN 09/24/2020 Lexiscan nuclear stress test performed using 1-day protocol. SPECT images showed small sized, mild  intensity, mildly reversible perfusion defect in apical to basal inferior myocardium. Stress LVEF 53%. Low risk study.   Patient has had peripheral arteriogram on 01/03/2020 revealing widely patent large vessels and small vessel disease.  In the absence of open wounds or nonhealing foot ulcer and absence of rest pain, recommend medical therapy  Echocardiogram 06/15/2021: 1. Left ventricular ejection fraction, by estimation, is 60 to 65%. The  left ventricle has normal function. The left ventricle has no regional  wall motion abnormalities. There is severe left ventricular hypertrophy.  Left ventricular diastolic parameters  are consistent with Grade I diastolic dysfunction (impaired relaxation).   2. Right ventricular systolic function is normal. The right ventricular size is normal.   3. Left atrial size was mildly dilated.   4. The mitral valve is abnormal. Mild to moderate mitral valve regurgitation.   5. The aortic valve is tricuspid. Mild aortic valve sclerosis is present, with no evidence of  aortic valve stenosis.   6. The inferior vena cava is normal in size with greater than 50%  respiratory variability, suggesting right atrial pressure of 3 mmHg.   Comparison(s): No significant change from prior study. 01/11/2021: LVEF  60-65%, moderate LVH, grade 1 DD, moderate MR.   Peripheral arteriogram 01/14/2022: Abdominal aortogram reveals presence of 2 renal arteries 1 on either sides.  Widely patent.  Mild disease is evident.  Abdominal aorta itself does not reveal any abdominal aortic aneurysm or stenosis.  Aortoiliac bifurcation is widely patent.  Femoral arteries bilaterally were widely patent all the way up to the above-knee and below-knee.  No significant disease was evident.  There is three-vessel runoff noted below the bilateral knee, there is mild disease in the small vessels.  The small vessels were not well visualized due to use of CO2 only however patency of the AT, PT and peroneal  vessels documented.  Mild disease to moderate disease in the left peroneal and anterior tibial vessel was evident.  Recommendation: Patient symptoms is pseudoclaudication not related to peripheral arterial disease.  Previously noted bilateral anterior tibial vessel occlusion is now widely patent, very mild disease is evident below the knee.  Medical therapy.  Evaluation for noncardiac causes of lower extremity weakness and pain indicated.  20 mL contrast utilized.   Carotid artery duplex 03/05/2023: Duplex suggests stenosis in the right internal carotid artery (50-69%), upper end of the spectrum.  <50% stenosis in the right external carotid artery. Duplex suggests stenosis in the left internal carotid artery (1-15%). Antegrade right vertebral artery flow. Antegrade left vertebral artery flow. Overall no significant change from 07/23/2022. Follow up in six months is appropriate if clinically indicated.  ABI 03/19/2023: Right ABI noncompressible.  Abnormal  monophasic waveform in PT and DP.  TBI 0.51 abnormal. Left ABI noncompressible.  Abnormal monophasic waveform in PT and DP.  TBI abnormal at the left big toe. Compared to prior study 02/11/2021, left TBI was 0.42.  This represents inadequate perfusion for healing at the big toe. No significant change from 02/11/2021.  EKG   EKG 03/16/2023: Normal sinus rhythm at rate of 68 bpm, LAE, normal axis, right bundle branch block.  Single PAC.  Compared to 07/23/2022, no significant change.   Assessment:     ICD-10-CM   1. Eschar of left great toe  R23.4 DG Foot 2 Views Left    Lidocaine-Benzalkonium 4-0.13 % GEL    2. Ulcer of left foot, limited to breakdown of skin (HCC)  L97.521 DG Foot 2 Views Left    Lidocaine-Benzalkonium 4-0.13 % GEL    3. Orthostatic hypotension  I95.1        Meds ordered this encounter  Medications   Lidocaine-Benzalkonium 4-0.13 % GEL    Sig: Apply 1 Pump topically in the morning, at noon, in the evening, and at  bedtime.    Dispense:  946 each    Refill:  1    Bottle: 59 mL  Box: 3.55 mL, 3.4 g  Packet: 3.55 mL, 3.4 g  Tube: 75 mL  Pump Btl: 946 mL Please dispense 946 mL pump bottle   There are no discontinued medications.   Recommendations:   Avier Dalsanto  is a 89 y.o. male with history of CAD, S/P CABG in 2009, small vessel PAD, pseudoclaudication, type II diabetes with autonomic insufficiency and autonomic orthostatic hypotension, primary hypertension, asymptomatic carotid stenosis, stage IIIa chronic kidney disease and hyperlipidemia.    1. Eschar of left great toe This is working to be evaluated for possible need for amputation.  He was seen by his podiatrist and recommended to go to the emergency room for amputation as patient's foot x-ray revealed?  Osteomyelitis.  Today's examination is unchanged from previous, the eschar is dry, there is clear-cut demarcation, comparing the photographs from before, today there is no change.  I ordered a foot x-ray to evaluate for any evidence of osteomyelitis.  Suspect he probably may be heading towards autoamputation for a time, this eschar has been there for most 1.5 years or so, it hurts when he rubs against the sheets when he sleeps but otherwise states that he is doing well.  He prefers to keep his toe if possible and does not want amputation.  Advised the patient and his daughter that if indeed there is evidence of osteomyelitis, best option is to proceed with amputation.  There is always a risk of slow healing but overall as evaluated by recent peripheral arteriogram, he still has 2 vessel runoff and a third vessel that is diseased diffusely, suspect he will probably heal well.  If there is no osteomyelitis, we will continue conservative therapy for now and wait for autoamputation.  For now patient will try lidocaine gel for pain relief.  ABI reveals significant abnormality that suggest nonhealing ulceration however clinically he is healing well and  there is no evidence of limb threatening ischemia, his foot and also all the other rest of the toes except at the ulceration and gangrenous portion is very nice and warm.  Capillary refill is <2 seconds.  - DG Foot 2 Views Left; Future - Lidocaine-Benzalkonium 4-0.13 % GEL; Apply 1 Pump topically in the morning, at noon, in the evening, and at bedtime.  Dispense: 946  each; Refill: 1  2. Ulcer of left foot, limited to breakdown of skin (HCC) As dictated above, the bone may be dissolving due to chronic ischemia.  Will evaluate x-ray and make further decision. - DG Foot 2 Views Left; Future - Lidocaine-Benzalkonium 4-0.13 % GEL; Apply 1 Pump topically in the morning, at noon, in the evening, and at bedtime.  Dispense: 946 each; Refill: 1   1. Ulcer of left foot, limited to breakdown of skin (HCC)   03/16/2023 photograph comparison to today 04/28/2023.  Patient's ulceration is clearly demarcated well, there is no discharge, it is completely dry gangrene.  His symptoms of pain is also improved.  Leg edema persist.  He is presently on Xarelto and aspirin, his foot is now very warm.  He has also noticed improvement in symptoms of mesenteric ischemia and abdominal cramps after eating, he has gained about 2 pounds in weight.  Hence for now continue the same along with nitroglycerin patch.   3. Orthostatic hypotension Patient continues to have orthostasis but fortunately has not had any syncope.  This is related to diabetic autonomic insufficiency.  Blood pressure is also well-controlled on spironolactone hydrochlorothiazide and labetalol with stable renal function at stage IIIa chronic kidney disease and good control of blood pressure standing.  Hence no changes were done today.  I will see him back as previously scheduled in January 2025.  If x-ray confirms osteomyelitis, patient is willing to have amputation, will refer him to be evaluated by Dr. Aldean Baker in the elective fashion.  Addendum: X-ray  left foot 2 view read 05/28/2023: Mild diffuse great toe soft tissue swelling with distal plantar soft tissue ulcer. No definitive cortical erosion is seen, however low bone mineralization overlying soft tissue lucencies limit evaluation. If there is clinical concerrn for osteomyelitis, note is made MRI would be more sensitive.  Hence we will continue observation for now.    Yates Decamp, MD, [facility] 05/22/2023, 12:05 PM Office: [phone] Fax: [phone] Pager: [phone]

## 2023-05-27 ENCOUNTER — Telehealth: Payer: Self-pay

## 2023-05-27 NOTE — Telephone Encounter (Signed)
Patient daughter called and is requesting patient's foot xray results.Patient is on MyChart and she checks it frequently. Please advise.

## 2023-05-28 NOTE — Progress Notes (Signed)
X-ray left foot 2 view 05/28/2023: Mild diffuse great toe soft tissue swelling with distal plantar soft tissue ulcer. No definitive cortical erosion is seen, however low bone mineralization overlying soft tissue lucencies limit evaluation. If there is clinical concerrn for osteomyelitis, note is made MRI would be more sensitive.

## 2023-06-19 ENCOUNTER — Other Ambulatory Visit: Payer: Self-pay

## 2023-06-19 ENCOUNTER — Encounter (HOSPITAL_COMMUNITY): Payer: Self-pay

## 2023-06-19 ENCOUNTER — Emergency Department (HOSPITAL_COMMUNITY): Payer: Medicare Other

## 2023-06-19 ENCOUNTER — Inpatient Hospital Stay (HOSPITAL_COMMUNITY)
Admission: EM | Admit: 2023-06-19 | Discharge: 2023-07-01 | DRG: 240 | Disposition: A | Discharge status: Skilled Nursing Facility | Payer: Medicare Other | Attending: Student | Admitting: Student

## 2023-06-19 DIAGNOSIS — I25118 Atherosclerotic heart disease of native coronary artery with other forms of angina pectoris: Secondary | ICD-10-CM | POA: Diagnosis not present

## 2023-06-19 DIAGNOSIS — E1152 Type 2 diabetes mellitus with diabetic peripheral angiopathy with gangrene: Secondary | ICD-10-CM | POA: Diagnosis present

## 2023-06-19 DIAGNOSIS — L97529 Non-pressure chronic ulcer of other part of left foot with unspecified severity: Secondary | ICD-10-CM | POA: Diagnosis present

## 2023-06-19 DIAGNOSIS — D649 Anemia, unspecified: Secondary | ICD-10-CM

## 2023-06-19 DIAGNOSIS — F0390 Unspecified dementia without behavioral disturbance: Secondary | ICD-10-CM | POA: Diagnosis present

## 2023-06-19 DIAGNOSIS — N183 Chronic kidney disease, stage 3 unspecified: Secondary | ICD-10-CM | POA: Diagnosis present

## 2023-06-19 DIAGNOSIS — N1832 Chronic kidney disease, stage 3b: Secondary | ICD-10-CM | POA: Diagnosis present

## 2023-06-19 DIAGNOSIS — I96 Gangrene, not elsewhere classified: Secondary | ICD-10-CM | POA: Diagnosis not present

## 2023-06-19 DIAGNOSIS — Z87891 Personal history of nicotine dependence: Secondary | ICD-10-CM

## 2023-06-19 DIAGNOSIS — H35033 Hypertensive retinopathy, bilateral: Secondary | ICD-10-CM | POA: Diagnosis present

## 2023-06-19 DIAGNOSIS — Z888 Allergy status to other drugs, medicaments and biological substances status: Secondary | ICD-10-CM | POA: Diagnosis not present

## 2023-06-19 DIAGNOSIS — Z8546 Personal history of malignant neoplasm of prostate: Secondary | ICD-10-CM | POA: Diagnosis not present

## 2023-06-19 DIAGNOSIS — E1140 Type 2 diabetes mellitus with diabetic neuropathy, unspecified: Secondary | ICD-10-CM | POA: Diagnosis present

## 2023-06-19 DIAGNOSIS — Z87442 Personal history of urinary calculi: Secondary | ICD-10-CM

## 2023-06-19 DIAGNOSIS — M1990 Unspecified osteoarthritis, unspecified site: Secondary | ICD-10-CM | POA: Diagnosis present

## 2023-06-19 DIAGNOSIS — M869 Osteomyelitis, unspecified: Secondary | ICD-10-CM | POA: Diagnosis present

## 2023-06-19 DIAGNOSIS — E876 Hypokalemia: Secondary | ICD-10-CM | POA: Diagnosis not present

## 2023-06-19 DIAGNOSIS — E113293 Type 2 diabetes mellitus with mild nonproliferative diabetic retinopathy without macular edema, bilateral: Secondary | ICD-10-CM | POA: Diagnosis present

## 2023-06-19 DIAGNOSIS — E785 Hyperlipidemia, unspecified: Secondary | ICD-10-CM | POA: Diagnosis not present

## 2023-06-19 DIAGNOSIS — I10 Essential (primary) hypertension: Secondary | ICD-10-CM | POA: Diagnosis present

## 2023-06-19 DIAGNOSIS — Z9049 Acquired absence of other specified parts of digestive tract: Secondary | ICD-10-CM

## 2023-06-19 DIAGNOSIS — E8721 Acute metabolic acidosis: Secondary | ICD-10-CM | POA: Diagnosis present

## 2023-06-19 DIAGNOSIS — E11621 Type 2 diabetes mellitus with foot ulcer: Secondary | ICD-10-CM | POA: Diagnosis present

## 2023-06-19 DIAGNOSIS — I129 Hypertensive chronic kidney disease with stage 1 through stage 4 chronic kidney disease, or unspecified chronic kidney disease: Secondary | ICD-10-CM | POA: Diagnosis present

## 2023-06-19 DIAGNOSIS — Z7901 Long term (current) use of anticoagulants: Secondary | ICD-10-CM

## 2023-06-19 DIAGNOSIS — Z79899 Other long term (current) drug therapy: Secondary | ICD-10-CM

## 2023-06-19 DIAGNOSIS — E7800 Pure hypercholesterolemia, unspecified: Secondary | ICD-10-CM | POA: Diagnosis present

## 2023-06-19 DIAGNOSIS — E1122 Type 2 diabetes mellitus with diabetic chronic kidney disease: Secondary | ICD-10-CM | POA: Diagnosis present

## 2023-06-19 DIAGNOSIS — D638 Anemia in other chronic diseases classified elsewhere: Secondary | ICD-10-CM | POA: Diagnosis present

## 2023-06-19 DIAGNOSIS — Z91041 Radiographic dye allergy status: Secondary | ICD-10-CM | POA: Diagnosis not present

## 2023-06-19 DIAGNOSIS — K219 Gastro-esophageal reflux disease without esophagitis: Secondary | ICD-10-CM | POA: Diagnosis present

## 2023-06-19 DIAGNOSIS — I252 Old myocardial infarction: Secondary | ICD-10-CM

## 2023-06-19 DIAGNOSIS — E119 Type 2 diabetes mellitus without complications: Secondary | ICD-10-CM

## 2023-06-19 DIAGNOSIS — I70262 Atherosclerosis of native arteries of extremities with gangrene, left leg: Secondary | ICD-10-CM | POA: Diagnosis present

## 2023-06-19 DIAGNOSIS — Z7982 Long term (current) use of aspirin: Secondary | ICD-10-CM

## 2023-06-19 DIAGNOSIS — E1169 Type 2 diabetes mellitus with other specified complication: Secondary | ICD-10-CM | POA: Diagnosis present

## 2023-06-19 DIAGNOSIS — I739 Peripheral vascular disease, unspecified: Secondary | ICD-10-CM | POA: Diagnosis not present

## 2023-06-19 DIAGNOSIS — I2510 Atherosclerotic heart disease of native coronary artery without angina pectoris: Secondary | ICD-10-CM | POA: Diagnosis present

## 2023-06-19 DIAGNOSIS — Z794 Long term (current) use of insulin: Secondary | ICD-10-CM

## 2023-06-19 DIAGNOSIS — I6523 Occlusion and stenosis of bilateral carotid arteries: Secondary | ICD-10-CM | POA: Diagnosis present

## 2023-06-19 DIAGNOSIS — Z8249 Family history of ischemic heart disease and other diseases of the circulatory system: Secondary | ICD-10-CM

## 2023-06-19 DIAGNOSIS — E1165 Type 2 diabetes mellitus with hyperglycemia: Secondary | ICD-10-CM | POA: Diagnosis present

## 2023-06-19 DIAGNOSIS — S88112S Complete traumatic amputation at level between knee and ankle, left lower leg, sequela: Secondary | ICD-10-CM | POA: Diagnosis not present

## 2023-06-19 DIAGNOSIS — Z9109 Other allergy status, other than to drugs and biological substances: Secondary | ICD-10-CM

## 2023-06-19 DIAGNOSIS — Z833 Family history of diabetes mellitus: Secondary | ICD-10-CM

## 2023-06-19 LAB — CBC WITH DIFFERENTIAL/PLATELET
Abs Immature Granulocytes: 0.1 10*3/uL — ABNORMAL HIGH (ref 0.00–0.07)
Basophils Absolute: 0.1 10*3/uL (ref 0.0–0.1)
Basophils Relative: 1 %
Eosinophils Absolute: 0.2 10*3/uL (ref 0.0–0.5)
Eosinophils Relative: 2 %
HCT: 28.4 % — ABNORMAL LOW (ref 39.0–52.0)
Hemoglobin: 9.1 g/dL — ABNORMAL LOW (ref 13.0–17.0)
Immature Granulocytes: 1 %
Lymphocytes Relative: 12 %
Lymphs Abs: 1.4 10*3/uL (ref 0.7–4.0)
MCH: 28.1 pg (ref 26.0–34.0)
MCHC: 32 g/dL (ref 30.0–36.0)
MCV: 87.7 fL (ref 80.0–100.0)
Monocytes Absolute: 0.7 10*3/uL (ref 0.1–1.0)
Monocytes Relative: 6 %
Neutro Abs: 8.7 10*3/uL — ABNORMAL HIGH (ref 1.7–7.7)
Neutrophils Relative %: 78 %
Platelets: 250 10*3/uL (ref 150–400)
RBC: 3.24 MIL/uL — ABNORMAL LOW (ref 4.22–5.81)
RDW: 13.9 % (ref 11.5–15.5)
WBC: 11.1 10*3/uL — ABNORMAL HIGH (ref 4.0–10.5)
nRBC: 0 % (ref 0.0–0.2)

## 2023-06-19 LAB — SEDIMENTATION RATE: Sed Rate: 106 mm/hr — ABNORMAL HIGH (ref 0–16)

## 2023-06-19 LAB — HEMOGLOBIN A1C
Hgb A1c MFr Bld: 8.8 % — ABNORMAL HIGH (ref 4.8–5.6)
Mean Plasma Glucose: 205.86 mg/dL

## 2023-06-19 LAB — BASIC METABOLIC PANEL
Anion gap: 10 (ref 5–15)
BUN: 39 mg/dL — ABNORMAL HIGH (ref 8–23)
CO2: 19 mmol/L — ABNORMAL LOW (ref 22–32)
Calcium: 9 mg/dL (ref 8.9–10.3)
Chloride: 106 mmol/L (ref 98–111)
Creatinine, Ser: 1.76 mg/dL — ABNORMAL HIGH (ref 0.61–1.24)
GFR, Estimated: 37 mL/min — ABNORMAL LOW (ref >60)
Glucose, Bld: 233 mg/dL — ABNORMAL HIGH (ref 70–99)
Potassium: 4.7 mmol/L (ref 3.5–5.1)
Sodium: 135 mmol/L (ref 135–145)

## 2023-06-19 LAB — GLUCOSE, CAPILLARY: Glucose-Capillary: 238 mg/dL — ABNORMAL HIGH (ref 70–99)

## 2023-06-19 LAB — FERRITIN: Ferritin: 51 ng/mL (ref 24–336)

## 2023-06-19 LAB — IRON AND TIBC
Iron: 26 ug/dL — ABNORMAL LOW (ref 45–182)
Saturation Ratios: 9 % — ABNORMAL LOW (ref 17.9–39.5)
TIBC: 281 ug/dL (ref 250–450)
UIBC: 255 ug/dL

## 2023-06-19 LAB — I-STAT CG4 LACTIC ACID, ED: Lactic Acid, Venous: 1.6 mmol/L (ref 0.5–1.9)

## 2023-06-19 LAB — C-REACTIVE PROTEIN: CRP: 4.5 mg/dL — ABNORMAL HIGH (ref <1.0)

## 2023-06-19 LAB — CBG MONITORING, ED: Glucose-Capillary: 157 mg/dL — ABNORMAL HIGH (ref 70–99)

## 2023-06-19 MED ORDER — PANTOPRAZOLE SODIUM 40 MG PO TBEC
80.0000 mg | DELAYED_RELEASE_TABLET | Freq: Every day | ORAL | Status: DC
  Administered 2023-06-20 – 2023-07-01 (×12): 80 mg via ORAL
  Filled 2023-06-19 (×12): qty 2

## 2023-06-19 MED ORDER — HYDROCHLOROTHIAZIDE 12.5 MG PO TABS
12.5000 mg | ORAL_TABLET | Freq: Every day | ORAL | Status: DC
  Administered 2023-06-20 – 2023-06-23 (×4): 12.5 mg via ORAL
  Filled 2023-06-19 (×4): qty 1

## 2023-06-19 MED ORDER — METRONIDAZOLE 500 MG/100ML IV SOLN
500.0000 mg | Freq: Two times a day (BID) | INTRAVENOUS | Status: AC
  Administered 2023-06-19 – 2023-06-25 (×13): 500 mg via INTRAVENOUS
  Filled 2023-06-19 (×13): qty 100

## 2023-06-19 MED ORDER — INSULIN ASPART 100 UNIT/ML IJ SOLN
0.0000 [IU] | Freq: Three times a day (TID) | INTRAMUSCULAR | Status: DC
  Administered 2023-06-19 – 2023-06-20 (×3): 2 [IU] via SUBCUTANEOUS
  Administered 2023-06-21 (×3): 3 [IU] via SUBCUTANEOUS
  Administered 2023-06-22 – 2023-06-23 (×4): 2 [IU] via SUBCUTANEOUS
  Administered 2023-06-23: 5 [IU] via SUBCUTANEOUS
  Administered 2023-06-24: 1 [IU] via SUBCUTANEOUS
  Administered 2023-06-25: 3 [IU] via SUBCUTANEOUS
  Administered 2023-06-25 – 2023-06-26 (×2): 2 [IU] via SUBCUTANEOUS
  Administered 2023-06-26: 1 [IU] via SUBCUTANEOUS
  Administered 2023-06-26: 2 [IU] via SUBCUTANEOUS
  Administered 2023-06-27: 5 [IU] via SUBCUTANEOUS
  Administered 2023-06-27: 2 [IU] via SUBCUTANEOUS
  Administered 2023-06-27 – 2023-06-28 (×2): 5 [IU] via SUBCUTANEOUS
  Administered 2023-06-28: 3 [IU] via SUBCUTANEOUS
  Administered 2023-06-28 – 2023-06-29 (×2): 2 [IU] via SUBCUTANEOUS
  Administered 2023-06-29: 3 [IU] via SUBCUTANEOUS
  Administered 2023-06-29 – 2023-07-01 (×5): 2 [IU] via SUBCUTANEOUS

## 2023-06-19 MED ORDER — SIMVASTATIN 20 MG PO TABS
20.0000 mg | ORAL_TABLET | Freq: Every day | ORAL | Status: DC
  Administered 2023-06-19 – 2023-06-30 (×12): 20 mg via ORAL
  Filled 2023-06-19 (×12): qty 1

## 2023-06-19 MED ORDER — SODIUM CHLORIDE 0.9 % IV SOLN: 1.0000 g | INTRAVENOUS | Status: DC

## 2023-06-19 MED ORDER — HEPARIN SODIUM (PORCINE) 5000 UNIT/ML IJ SOLN: 5000.0000 [IU] | Freq: Three times a day (TID) | INTRAMUSCULAR | Status: DC

## 2023-06-19 MED ORDER — MECLIZINE HCL 25 MG PO TABS: 25.0000 mg | ORAL_TABLET | Freq: Two times a day (BID) | ORAL | Status: DC | PRN

## 2023-06-19 MED ORDER — VANCOMYCIN HCL IN DEXTROSE 1-5 GM/200ML-% IV SOLN
1000.0000 mg | Freq: Once | INTRAVENOUS | Status: AC
  Administered 2023-06-19: 1000 mg via INTRAVENOUS
  Filled 2023-06-19: qty 200

## 2023-06-19 MED ORDER — SODIUM CHLORIDE 0.9 % IV SOLN: INTRAVENOUS | Status: AC

## 2023-06-19 MED ORDER — DULOXETINE HCL 60 MG PO CPEP
60.0000 mg | ORAL_CAPSULE | Freq: Every day | ORAL | Status: DC
  Administered 2023-06-20 – 2023-07-01 (×12): 60 mg via ORAL
  Filled 2023-06-19 (×12): qty 1

## 2023-06-19 MED ORDER — TAMSULOSIN HCL 0.4 MG PO CAPS
0.4000 mg | ORAL_CAPSULE | Freq: Every day | ORAL | Status: DC
  Administered 2023-06-20 – 2023-06-30 (×9): 0.4 mg via ORAL
  Filled 2023-06-19 (×9): qty 1

## 2023-06-19 MED ORDER — INSULIN GLARGINE-YFGN 100 UNIT/ML ~~LOC~~ SOLN
5.0000 [IU] | Freq: Every day | SUBCUTANEOUS | Status: DC
  Administered 2023-06-20 – 2023-06-21 (×2): 5 [IU] via SUBCUTANEOUS
  Filled 2023-06-19 (×4): qty 0.05

## 2023-06-19 MED ORDER — SODIUM CHLORIDE 0.9 % IV SOLN
2.0000 g | INTRAVENOUS | Status: AC
  Administered 2023-06-19 – 2023-06-25 (×6): 2 g via INTRAVENOUS
  Filled 2023-06-19 (×6): qty 20

## 2023-06-19 MED ORDER — GABAPENTIN 100 MG PO CAPS: 100.0000 mg | ORAL_CAPSULE | Freq: Three times a day (TID) | ORAL | Status: DC

## 2023-06-19 MED ORDER — ROPINIROLE HCL 0.5 MG PO TABS
5.0000 mg | ORAL_TABLET | Freq: Every day | ORAL | Status: DC
  Administered 2023-06-20 – 2023-06-30 (×10): 5 mg via ORAL
  Filled 2023-06-19 (×7): qty 5
  Filled 2023-06-19: qty 10
  Filled 2023-06-19 (×3): qty 5

## 2023-06-19 MED ORDER — ONDANSETRON HCL 4 MG/2ML IJ SOLN: 4.0000 mg | Freq: Four times a day (QID) | INTRAMUSCULAR | Status: DC | PRN

## 2023-06-19 MED ORDER — ONDANSETRON HCL 4 MG PO TABS: 4.0000 mg | ORAL_TABLET | Freq: Four times a day (QID) | ORAL | Status: DC | PRN

## 2023-06-19 MED ORDER — HYDROCODONE-ACETAMINOPHEN 5-325 MG PO TABS: 1.0000 | ORAL_TABLET | ORAL | Status: DC

## 2023-06-19 MED ORDER — ACETAMINOPHEN 325 MG PO TABS
650.0000 mg | ORAL_TABLET | Freq: Four times a day (QID) | ORAL | Status: DC | PRN
  Administered 2023-06-19 – 2023-06-20 (×2): 650 mg via ORAL
  Filled 2023-06-19 (×2): qty 2

## 2023-06-19 MED ORDER — HEPARIN SODIUM (PORCINE) 5000 UNIT/ML IJ SOLN
5000.0000 [IU] | Freq: Three times a day (TID) | INTRAMUSCULAR | Status: DC
  Administered 2023-06-19 – 2023-07-01 (×33): 5000 [IU] via SUBCUTANEOUS
  Filled 2023-06-19 (×31): qty 1

## 2023-06-19 MED ORDER — ACETAMINOPHEN 650 MG RE SUPP: 650.0000 mg | Freq: Four times a day (QID) | RECTAL | Status: DC | PRN

## 2023-06-19 MED ORDER — SPIRONOLACTONE 12.5 MG HALF TABLET
12.5000 mg | ORAL_TABLET | Freq: Every day | ORAL | Status: DC
  Administered 2023-06-20 – 2023-06-23 (×4): 12.5 mg via ORAL
  Filled 2023-06-19 (×5): qty 1

## 2023-06-19 MED ORDER — ASPIRIN 81 MG PO CHEW
81.0000 mg | CHEWABLE_TABLET | Freq: Every day | ORAL | Status: DC
  Administered 2023-06-20 – 2023-07-01 (×12): 81 mg via ORAL
  Filled 2023-06-19 (×12): qty 1

## 2023-06-19 MED ORDER — SPIRONOLACTONE-HCTZ 25-25 MG PO TABS: 0.5000 | ORAL_TABLET | Freq: Every day | ORAL | Status: DC

## 2023-06-19 MED ORDER — CLONAZEPAM 0.5 MG PO TABS
0.5000 mg | ORAL_TABLET | Freq: Every day | ORAL | Status: DC | PRN
  Administered 2023-06-25: 0.5 mg via ORAL
  Filled 2023-06-19: qty 1

## 2023-06-19 MED ORDER — FUROSEMIDE 40 MG PO TABS
40.0000 mg | ORAL_TABLET | Freq: Every day | ORAL | Status: DC
  Administered 2023-06-20 – 2023-07-01 (×11): 40 mg via ORAL
  Filled 2023-06-19 (×11): qty 1

## 2023-06-19 MED ORDER — EZETIMIBE 10 MG PO TABS
10.0000 mg | ORAL_TABLET | Freq: Every evening | ORAL | Status: DC
  Administered 2023-06-20 – 2023-06-30 (×9): 10 mg via ORAL
  Filled 2023-06-19 (×10): qty 1

## 2023-06-19 MED ORDER — HYDROCODONE-ACETAMINOPHEN 5-325 MG PO TABS
1.0000 | ORAL_TABLET | ORAL | Status: DC | PRN
  Administered 2023-06-20: 1 via ORAL
  Filled 2023-06-19: qty 1

## 2023-06-19 MED ORDER — HYDRALAZINE HCL 20 MG/ML IJ SOLN
5.0000 mg | Freq: Three times a day (TID) | INTRAMUSCULAR | Status: DC | PRN
  Administered 2023-06-20: 5 mg via INTRAVENOUS
  Filled 2023-06-19: qty 1

## 2023-06-19 MED ORDER — LABETALOL HCL 100 MG PO TABS
100.0000 mg | ORAL_TABLET | Freq: Two times a day (BID) | ORAL | Status: DC
  Administered 2023-06-20 – 2023-07-01 (×22): 100 mg via ORAL
  Filled 2023-06-19 (×24): qty 1

## 2023-06-19 NOTE — Assessment & Plan Note (Addendum)
patent graft except occluded SVG to RCA which is diffusely diseased small vessel and also severe below-knee bilateral lower disease by angiography on 11/04/2016 and mild small vessel peripheral arterial disease especially in the left leg angiography in April 2023.   ABI 03/19/2023: Right ABI noncompressible.  Abnormal monophasic waveform in PT and DP.  TBI 0.51 abnormal. Left ABI noncompressible.  Abnormal monophasic waveform in PT and DP.  TBI abnormal at the left big toe. Compared to prior study 02/11/2021, left TBI was 0.42.  This represents inadequate perfusion for healing at the big toe. No significant change from 02/11/2021.  Vascular surgery consulted by EDP Continue medical management

## 2023-06-19 NOTE — Assessment & Plan Note (Signed)
89 year old male presenting with chronic left great toe ulcer x1.5 years that has had worsening odor, pus and bloody drainage with increased pain with possible early osteomyelitis by xray -admit to med -surg -vascular surgery and ortho ([facility]) consulted  -check ESR/CRP -no SIRS criteria, but will continue broad spectrum abx with hx of T2DM and concern for OM  -likely needs amputation  -ABI done in 02/2023. Abnormal, but unchanged since 01/2021. Mild small vessel peripheral arterial disease by angiography in 12/2021  -continue home pain control

## 2023-06-19 NOTE — Assessment & Plan Note (Signed)
Likely due to ACD Check iron studies

## 2023-06-19 NOTE — Progress Notes (Signed)
Patient transferred from ED via bed. Alert and oriented. On Room air. Will continue to monitor

## 2023-06-19 NOTE — Assessment & Plan Note (Addendum)
Elevated in ED Has hx of orthostatic hypotension due to diabetic dysautonomia  Continue labetalol, aldactazide  PRN hydralazine

## 2023-06-19 NOTE — Assessment & Plan Note (Signed)
Continue PPI daily.

## 2023-06-19 NOTE — ED Triage Notes (Signed)
Patient has gangrene to left great toe x 6 months.  Reports his doctor wanted to see if it would amputate itself.  Patient reports it hurts all the time and occasional bleed.  Reports foul odor.

## 2023-06-19 NOTE — Assessment & Plan Note (Addendum)
Recent labs show creatinine at 1.6 (baseline 1.4-1.6)  Stable, continue to monitor  Gentle, time limited IVF Followed by nephrology  Trend

## 2023-06-19 NOTE — ED Provider Notes (Signed)
[place] EMERGENCY DEPARTMENT AT [facility] Provider Note   CSN: [id number] Arrival date & time: 06/19/23  1247     History  Chief Complaint  Patient presents with   Diabetic Ulcer    Justin [name] is a 89 y.o. male with a past medical history significant for type 2 diabetes, CKD, CAD, GERD, early dementia, gangrene of left great toe presents today as recommended by Dr. Harriet Pho for suspicion of osteomyelitis of left great toe and for evaluation for amputation.  Patient's daughter states that left great toe has been followed by podiatry for over a year in hopes of autoamputation and pain has been increasing with some relief from hydrocodone.  Patient's daughter denies fever or chills and endorses purulent discharge with foul odor.  HPI     Home Medications Prior to Admission medications   Medication Sig Start Date End Date Taking? Authorizing Provider  ACCU-CHEK SMARTVIEW test strip  03/27/19   [provider]  Accu-Chek Softclix Lancets lancets  05/18/20   [provider]  acetaminophen (TYLENOL) 500 MG tablet Take 1,000 mg by mouth 2 (two) times daily as needed for mild pain.    [provider]  aspirin (ASPIRIN CHILDRENS) 81 MG chewable tablet Chew 1 tablet (81 mg total) by mouth daily. 03/16/23   Yates Decamp, MD  Blood Glucose Monitoring Suppl (ACCU-CHEK GUIDE) w/Device KIT  05/08/20   [provider]  cholecalciferol (VITAMIN D3) 25 MCG (1000 UNIT) tablet Take 1,000 Units by mouth daily.    [provider]  clonazePAM (KLONOPIN) 0.5 MG tablet Take 0.5 mg by mouth daily as needed for anxiety.    [provider]  docusate sodium (COLACE) 50 MG capsule Take 50 mg by mouth 2 (two) times daily.    [provider]  DULoxetine (CYMBALTA) 30 MG capsule Take 2 capsules by mouth once daily 04/06/23   Yates Decamp, MD  ezetimibe (ZETIA) 10 MG tablet TAKE 1 TABLET DAILY AFTER SUPPER Patient taking differently: Take 10 mg  by mouth every evening. 03/27/21   Yates Decamp, MD  furosemide (LASIX) 40 MG tablet Take 40 mg by mouth.    [provider]  gabapentin (NEURONTIN) 300 MG capsule Take 300 mg by mouth 3 (three) times daily. 07/16/21   [provider]  HYDROcodone-acetaminophen (NORCO/VICODIN) 5-325 MG tablet Take 1 tablet by mouth every 6 (six) hours as needed for moderate pain.    [provider]  labetalol (NORMODYNE) 100 MG tablet Take 1 tablet (100 mg total) by mouth 2 (two) times daily. 03/16/23   Yates Decamp, MD  LANTUS SOLOSTAR 100 UNIT/ML Solostar Pen Inject 10 Units into the skin 2 (two) times daily. Inject 10 units in the morning and 10 units in the evening. 09/10/16   [provider]  Lidocaine-Benzalkonium 4-0.13 % GEL Apply 1 Pump topically in the morning, at noon, in the evening, and at bedtime. 05/22/23   Yates Decamp, MD  meclizine (ANTIVERT) 25 MG tablet Take 25 mg by mouth 2 (two) times daily. 06/16/21   [provider]  nitroGLYCERIN (NITRODUR - DOSED IN MG/24 HR) 0.2 mg/hr patch Place 1 patch (0.2 mg total) onto the skin daily. 03/16/23   Yates Decamp, MD  omeprazole (PRILOSEC) 40 MG capsule Take 40 mg by mouth daily.  11/22/18   [provider]  Pyridoxine HCl (B-6 PO) Take 1 capsule by mouth daily.    [provider]  rivaroxaban (XARELTO) 2.5 MG TABS tablet Take 1  tablet (2.5 mg total) by mouth 2 (two) times daily. 03/16/23   Yates Decamp, MD  ropinirole (REQUIP) 5 MG tablet Take 5 mg by mouth at bedtime.    [provider]  simvastatin (ZOCOR) 20 MG tablet Take 20 mg by mouth at bedtime. 01/26/20   [provider]  spironolactone-hydrochlorothiazide (ALDACTAZIDE) 25-25 MG tablet TAKE 1/2 (ONE-HALF) TABLET BY MOUTH IN THE MORNING 04/22/23   Yates Decamp, MD  tamsulosin (FLOMAX) 0.4 MG CAPS capsule TAKE 1 CAPSULE BY MOUTH ONCE DAILY AFTER SUPPER 04/14/23   Yates Decamp, MD  vitamin B-12 (CYANOCOBALAMIN) 500 MCG tablet Take 500 mcg by  mouth daily.    [provider]  Vitamin E 268 MG (400 UNIT) CAPS Take 400 Units by mouth daily.    [provider]      Allergies    Contrast media [iodinated contrast media], Pletal [cilostazol], Amlodipine, Dye fdc red [red dye #40 (allura red)], and Ibuprofen    Review of Systems   Review of Systems  Constitutional:  Negative for fever.  Skin:  Positive for rash and wound.    Physical Exam Updated Vital Signs BP (!) 101/49 (BP Location: Right Arm)   Pulse 70   Temp 98.5 F (36.9 C)   Resp 18   Ht 5\' 7"  (1.702 m)   Wt 71.2 kg   SpO2 99%   BMI 24.59 kg/m  Physical Exam Vitals and nursing note reviewed.  Constitutional:      General: He is not in acute distress.    Appearance: He is well-developed. He is not toxic-appearing.  HENT:     Head: Normocephalic and atraumatic.     Mouth/Throat:     Mouth: Mucous membranes are moist.     Pharynx: No posterior oropharyngeal erythema.  Eyes:     Conjunctiva/sclera: Conjunctivae normal.  Cardiovascular:     Rate and Rhythm: Normal rate and regular rhythm.     Heart sounds: No murmur heard.    Comments: Slight bradycardia at 57.  Not bradycardic on physical exam Pulmonary:     Effort: Pulmonary effort is normal. No respiratory distress.     Breath sounds: Normal breath sounds.  Abdominal:     Palpations: Abdomen is soft.     Tenderness: There is no abdominal tenderness.  Musculoskeletal:        General: Swelling present.     Cervical back: Neck supple. No rigidity.     Left lower leg: Edema present.     Comments: Dorsum of left foot localized swelling.  Edema of left ankle and foot  Skin:    General: Skin is warm and dry.     Findings: Rash present.     Comments: Left dorsalis pedis pulse absent, left great toe with eschar and exposed bone with slight foul odor  Neurological:     General: No focal deficit present.     Mental Status: He is alert.     Motor: No weakness.  Psychiatric:        Mood and  Affect: Mood normal.     ED Results / Procedures / Treatments   Labs (all labs ordered are listed, but only abnormal results are displayed) Labs Reviewed  CBC WITH DIFFERENTIAL/PLATELET - Abnormal; Notable for the following components:      Result Value   WBC 11.1 (*)    RBC 3.24 (*)    Hemoglobin 9.1 (*)    HCT 28.4 (*)    Neutro Abs 8.7 (*)  Abs Immature Granulocytes 0.10 (*)    All other components within normal limits  BASIC METABOLIC PANEL - Abnormal; Notable for the following components:   CO2 19 (*)    Glucose, Bld 233 (*)    BUN 39 (*)    Creatinine, Ser 1.76 (*)    GFR, Estimated 37 (*)    All other components within normal limits  HEMOGLOBIN A1C  I-STAT CG4 LACTIC ACID, ED    EKG None  Radiology DG Foot Complete Left  Result Date: 06/19/2023 CLINICAL DATA:  Left great toe osteomyelitis. Gangrene of left great toe for 6 months. EXAM: LEFT FOOT - COMPLETE 3+ VIEW COMPARISON:  Left foot radiographs 05/22/2023 FINDINGS: There is again diffuse decreased bone mineralization. There is increased lucency overlying the distal great toe soft tissues indicating subcutaneous air around the distal phalanx. There is thinning of the distal lateral aspect of the great toe on frontal view with possible exposure of bone to air. There is attenuation of the distal medial aspect of the great toe soft tissues on frontal and oblique view. Overlap of the toes lateral view limits evaluation of the great toe soft tissues. There is a increased lucency which may reflect minimal early cortical thinning of the mid to distal aspect of the great toe distal phalanx. Tiny plantar calcaneal heel spur. High-grade atherosclerotic calcifications. IMPRESSION: 1. Increased subcutaneous air within the distal aspect of the great toe. 2. There is thinning of the distal lateral aspect of the great toe on frontal view with possible exposure of bone to air. 3. Increased lucency which may reflect minimal early  cortical thinning of the mid to distal aspect of the great toe distal phalanx. This may represent early osteomyelitis. Electronically Signed   By: Neita Garnet M.D.   On: 06/19/2023 14:16    Procedures Procedures    Medications Ordered in ED Medications  vancomycin (VANCOCIN) IVPB 1000 mg/200 mL premix (1,000 mg Intravenous New Bag/Given 06/19/23 1643)  insulin aspart (novoLOG) injection 0-9 Units (has no administration in time range)    ED Course/ Medical Decision Making/ A&P                                 Medical Decision Making Risk Prescription drug management. Decision regarding hospitalization.  This patient presents to the ED with chief complaint(s) of left great toe pain with pertinent past medical history of type 2 diabetes, CKD, CAD, GERD, early dementia, gangrene of left great toe  which further complicates the presenting complaint. The complaint involves an extensive differential diagnosis and also carries with it a high risk of complications and morbidity.    The differential diagnosis includes osteomyelitis, gangrene of left great toe, sepsis  Additional history obtained: Additional history obtained from family Records reviewed Care Everywhere/External Records.  Records reviewed include podiatrist note, prior foot x-ray and cardiology notes.  ED Course and Reassessment:  Patient started on 1 g vancomycin.  Patient vital signs stable and in no acute distress.  Hypertension was noted throughout ER stay.  Independent labs interpretation:  The following labs were independently interpreted: CBC which showed slight leukocytosis.   Independent visualization of imaging: - I independently visualized the following imaging with scope of interpretation limited to determining acute life threatening conditions related to emergency care: Left foot x-ray, which revealed likely osteomyelitis of left great distal phalanx.  Consultation: - Consulted or discussed management/test  interpretation w/ external professional: Vascular surgery  for evaluation of amputation of left great toe.  Medicine for admission and antibiotics.  Medicine well contact Ortho for amputation.  Consideration for admission or further workup: Consideration for admission for antibiotics due to the osteomyelitis and evaluation for left great toe amputation.         Final Clinical Impression(s) / ED Diagnoses Final diagnoses:  Osteomyelitis of left foot, unspecified type [facility])    Rx / DC Orders ED Discharge Orders     None         Gretta Began 06/19/23 Jerene Bears    Eber Hong, MD 06/20/23 [phone]

## 2023-06-19 NOTE — ED Provider Notes (Signed)
This patient is an 89 year old male, known diabetic, presents with left great toe gangrene getting worse over 6 months now with a foul smell, purulent discharge and feeling poorly, went to his podiatrist today who told him to come to the hospital to have the toe amputated as they were considering osteomyelitis and deep tissue infection.  On my exam the patient has normal vital signs, he is in no distress, his labs are reassuring except for slight leukocytosis.  X-ray does show likely osteomyelitis of the distal phalanx.  Will discuss with vascular surgery and medicine for admission, antibiotics, patient otherwise stable  Dr. Myra Gianotti operating room agreeable to see the patient for vascular evaluation, request podiatry consultation for surgical amputation evaluation  Hospitalist has been kind enough to admit the patient to the hospital  Final diagnoses:  Osteomyelitis of left foot, unspecified type [facility])      Eber Hong, MD 06/20/23 [phone]

## 2023-06-19 NOTE — Assessment & Plan Note (Signed)
No recent A1C, pending Sensitive SSI and accuchecks qac/hs

## 2023-06-19 NOTE — ED Notes (Signed)
ED TO INPATIENT HANDOFF REPORT  ED Nurse Name and Phone #: [phone]   S Name/Age/Gender Justin [name] 89 y.o. male Room/Bed: H015C/H015C  Code Status   Code Status: Prior  Home/SNF/Other Home Patient oriented to: self, place, time, and situation Is this baseline? Yes   Triage Complete: Triage complete  Chief Complaint Gangrene of extremity (HCC) [I96]  Triage Note Patient has gangrene to left great toe x 6 months.  Reports his doctor wanted to see if it would amputate itself.  Patient reports it hurts all the time and occasional bleed.  Reports foul odor.    Allergies Allergies  Allergen Reactions   Contrast Media [Iodinated Contrast Media] Itching    PT STATES ALLERGY TO "CT DYE".   Pletal [Cilostazol] Diarrhea   Amlodipine Swelling    Leg swelling   Dye Fdc Red [Red Dye #40 (Allura Red)] Swelling    CAT scan dye   Ibuprofen Other (See Comments)    Upset stomach     Level of Care/Admitting Diagnosis ED Disposition     ED Disposition  Admit   Condition  --   Comment  Hospital Area: [facility] [[id number]]  Level of Care: Med-Surg [16]  May admit patient to [facility] or [facility] if equivalent level of care is available:: No  Covid Evaluation: Asymptomatic - no recent exposure (last 10 days) testing not required  Diagnosis: Gangrene of extremity [facility]) [[id number]]  Admitting Physician: Orland Mustard [[id number]]  Attending Physician: Orland Mustard [phone]  Certification:: I certify this patient will need inpatient services for at least 2 midnights  Expected Medical Readiness: 06/23/2023          B Medical/Surgery History Past Medical History:  Diagnosis Date   Anxiety    Arthritis    Carotid arterial disease (HCC)    CKD (chronic kidney disease)    Coronary artery disease    Diabetes mellitus without complication (HCC)    Diabetic retinopathy (HCC)    NPDR OU   Diverticulitis    Dyspnea    GERD (gastroesophageal reflux  disease)    History of kidney stones 2021   Hypercholesteremia    Hypertension    Hypertensive retinopathy    OU   Myocardial infarction [facility]) 2009   Pneumonia    as a child   Prostate cancer (HCC)    UTI (lower urinary tract infection)    Past Surgical History:  Procedure Laterality Date   ABDOMINAL SURGERY     for diverticulitis; also removed appendix   APPENDECTOMY     CARDIAC CATHETERIZATION  2018   CATARACT EXTRACTION     CATARACT EXTRACTION, BILATERAL     CHOLECYSTECTOMY N/A 10/12/2017   Procedure: LAPAROSCOPIC CHOLECYSTECTOMY;  Surgeon: Abigail Miyamoto, MD;  Location: [facility] OR;  Service: General;  Laterality: N/A;   CORONARY ARTERY BYPASS GRAFT  2009   ERCP N/A 12/21/2018   Procedure: ENDOSCOPIC RETROGRADE CHOLANGIOPANCREATOGRAPHY (ERCP);  Surgeon: Jeani Hawking, MD;  Location: [facility] ENDOSCOPY;  Service: Endoscopy;  Laterality: N/A;   ESOPHAGOGASTRODUODENOSCOPY (EGD) WITH PROPOFOL N/A 12/17/2018   Procedure: ESOPHAGOGASTRODUODENOSCOPY (EGD) WITH PROPOFOL;  Surgeon: Jeani Hawking, MD;  Location: [facility] ENDOSCOPY;  Service: Endoscopy;  Laterality: N/A;   EUS Left 12/17/2018   Procedure: UPPER ENDOSCOPIC ULTRASOUND (EUS) LINEAR;  Surgeon: Jeani Hawking, MD;  Location: [facility] ENDOSCOPY;  Service: Endoscopy;  Laterality: Left;   EYE SURGERY     "for bleeding in eye"   HERNIA REPAIR     LEFT HEART CATH AND  CORONARY ANGIOGRAPHY N/A 11/04/2016   Procedure: Left Heart Cath and Coronary Angiography;  Surgeon: Yates Decamp, MD;  Location: [facility] INVASIVE CV LAB;  Service: Cardiovascular;  Laterality: N/A;   LOWER EXTREMITY ANGIOGRAPHY N/A 11/04/2016   Procedure: Lower Extremity Angiography;  Surgeon: Yates Decamp, MD;  Location: [facility] INVASIVE CV LAB;  Service: Cardiovascular;  Laterality: N/A;   LOWER EXTREMITY ANGIOGRAPHY N/A 01/03/2020   Procedure: LOWER EXTREMITY ANGIOGRAPHY;  Surgeon: Yates Decamp, MD;  Location: [facility] INVASIVE CV LAB;  Service: Cardiovascular;  Laterality: N/A;   LOWER EXTREMITY ANGIOGRAPHY  Bilateral 01/14/2022   Procedure: Lower Extremity Angiography;  Surgeon: Yates Decamp, MD;  Location: [facility] INVASIVE CV LAB;  Service: Cardiovascular;  Laterality: Bilateral;   LUMBAR LAMINECTOMY/DECOMPRESSION MICRODISCECTOMY Bilateral 11/09/2020   Procedure: Laminectomy and Foraminotomy - bilateral - Lumbar Four-Five.;  Surgeon: Julio Sicks, MD;  Location: [facility] OR;  Service: Neurosurgery;  Laterality: Bilateral;  posterior   PROSTATE SURGERY     REMOVAL OF STONES  12/21/2018   Procedure: REMOVAL OF STONES;  Surgeon: Jeani Hawking, MD;  Location: [facility] ENDOSCOPY;  Service: Endoscopy;;   SPHINCTEROTOMY  12/21/2018   Procedure: Dennison Mascot;  Surgeon: Jeani Hawking, MD;  Location: [facility] ENDOSCOPY;  Service: Endoscopy;;     A IV Location/Drains/Wounds Patient Lines/Drains/Airways Status     Active Line/Drains/Airways     Name Placement date Placement time Site Days   Peripheral IV 06/19/23 20 G Anterior;Distal;Left;Upper Arm 06/19/23  1620  Arm  less than 1   Incision - 4 Ports Abdomen 1: Umbilicus 2: Mid;Upper 3: Right;Upper 4: Right;Lower 10/12/17  0757  -- 2076            Intake/Output Last 24 hours No intake or output data in the 24 hours ending 06/19/23 1657  Labs/Imaging Results for orders placed or performed during the hospital encounter of 06/19/23 (from the past 48 hour(s))  CBC with Differential     Status: Abnormal   Collection Time: 06/19/23  1:19 PM  Result Value Ref Range   WBC 11.1 (H) 4.0 - 10.5 K/uL   RBC 3.24 (L) 4.22 - 5.81 MIL/uL   Hemoglobin 9.1 (L) 13.0 - 17.0 g/dL   HCT 29.5 (L) 62.1 - 30.8 %   MCV 87.7 80.0 - 100.0 fL   MCH 28.1 26.0 - 34.0 pg   MCHC 32.0 30.0 - 36.0 g/dL   RDW 65.7 84.6 - 96.2 %   Platelets 250 150 - 400 K/uL   nRBC 0.0 0.0 - 0.2 %   Neutrophils Relative % 78 %   Neutro Abs 8.7 (H) 1.7 - 7.7 K/uL   Lymphocytes Relative 12 %   Lymphs Abs 1.4 0.7 - 4.0 K/uL   Monocytes Relative 6 %   Monocytes Absolute 0.7 0.1 - 1.0 K/uL   Eosinophils Relative  2 %   Eosinophils Absolute 0.2 0.0 - 0.5 K/uL   Basophils Relative 1 %   Basophils Absolute 0.1 0.0 - 0.1 K/uL   Immature Granulocytes 1 %   Abs Immature Granulocytes 0.10 (H) 0.00 - 0.07 K/uL    Comment: Performed at [facility], [address], [place], [place] [address]  Basic metabolic panel     Status: Abnormal   Collection Time: 06/19/23  1:19 PM  Result Value Ref Range   Sodium 135 135 - 145 mmol/L   Potassium 4.7 3.5 - 5.1 mmol/L   Chloride 106 98 - 111 mmol/L   CO2 19 (L) 22 - 32 mmol/L   Glucose, Bld 233 (  H) 70 - 99 mg/dL    Comment: Glucose reference range applies only to samples taken after fasting for at least 8 hours.   BUN 39 (H) 8 - 23 mg/dL   Creatinine, Ser 1.61 (H) 0.61 - 1.24 mg/dL   Calcium 9.0 8.9 - 09.6 mg/dL   GFR, Estimated 37 (L) >60 mL/min    Comment: (NOTE) Calculated using the CKD-EPI Creatinine Equation (2021)    Anion gap 10 5 - 15    Comment: Performed at [facility], [address], [place], [place] [address]  I-Stat CG4 Lactic Acid     Status: None   Collection Time: 06/19/23  1:31 PM  Result Value Ref Range   Lactic Acid, Venous 1.6 0.5 - 1.9 mmol/L  CBG monitoring, ED     Status: Abnormal   Collection Time: 06/19/23  4:49 PM  Result Value Ref Range   Glucose-Capillary 157 (H) 70 - 99 mg/dL    Comment: Glucose reference range applies only to samples taken after fasting for at least 8 hours.   DG Foot Complete Left  Result Date: 06/19/2023 CLINICAL DATA:  Left great toe osteomyelitis. Gangrene of left great toe for 6 months. EXAM: LEFT FOOT - COMPLETE 3+ VIEW COMPARISON:  Left foot radiographs 05/22/2023 FINDINGS: There is again diffuse decreased bone mineralization. There is increased lucency overlying the distal great toe soft tissues indicating subcutaneous air around the distal phalanx. There is thinning of the distal lateral aspect of the great toe on frontal view with possible exposure of bone to air. There is attenuation of  the distal medial aspect of the great toe soft tissues on frontal and oblique view. Overlap of the toes lateral view limits evaluation of the great toe soft tissues. There is a increased lucency which may reflect minimal early cortical thinning of the mid to distal aspect of the great toe distal phalanx. Tiny plantar calcaneal heel spur. High-grade atherosclerotic calcifications. IMPRESSION: 1. Increased subcutaneous air within the distal aspect of the great toe. 2. There is thinning of the distal lateral aspect of the great toe on frontal view with possible exposure of bone to air. 3. Increased lucency which may reflect minimal early cortical thinning of the mid to distal aspect of the great toe distal phalanx. This may represent early osteomyelitis. Electronically Signed   By: Neita Garnet M.D.   On: 06/19/2023 14:16    Pending Labs Unresulted Labs (From admission, onward)     Start     Ordered   06/19/23 1629  Hemoglobin A1c  Once,   URGENT       Comments: To assess prior glycemic control    06/19/23 1628            Vitals/Pain Today's Vitals   06/19/23 1302 06/19/23 1315 06/19/23 1449  BP: (!) 188/73  (!) 101/49  Pulse: 79  70  Resp: 18  18  Temp: 98.6 F (37 C)  98.5 F (36.9 C)  TempSrc: Oral    SpO2: 100%  99%  Weight:  157 lb (71.2 kg)   Height:  5\' 7"  (1.702 m)   PainSc:  3      Isolation Precautions No active isolations  Medications Medications  vancomycin (VANCOCIN) IVPB 1000 mg/200 mL premix (1,000 mg Intravenous New Bag/Given 06/19/23 1643)  insulin aspart (novoLOG) injection 0-9 Units (has no administration in time range)    Mobility walks with person assist     Focused Assessments Gangrene to left great toe.  Months but not getting better needing amputation   R Recommendations: See Admitting Provider Note  Report given to:   Additional Notes: [id number]

## 2023-06-19 NOTE — Assessment & Plan Note (Signed)
Stable on imaging. No more surveillance per cardiology  Continue medical therapy

## 2023-06-19 NOTE — H&P (Signed)
History and Physical    Patient: Justin [name]:[id number] DOB: 1932-12-05 DOA: 06/19/2023 DOS: the patient was seen and examined on 06/19/2023 PCP: Estevan Oaks, NP  Patient coming from: Home - lives with his daughter. Uses walker/cane/WC to ambulate    Chief Complaint: left great toe with drainage and foul odor   HPI: Justin [name] is a 89 y.o. male with medical history significant of T2DM, GERD, HLD, HTN, CAD s/p CABG in 2009, PAD, hx of prostate cancer, CKD stage 3b who presented to ED for worsening ulcer on his toe.  Daughter states he has had this for about 1.5 years after he had a toenail clipped. Over the past week she has seen pus and blood when she has changed the dressing which is abnormal. It has also had a foul smell. She also feels like there is moe dead skin that is not healing. There is not swelling or surrounding erythema. His toe is cooler to touch than rest of foot, but she states this is at baseline. She denies any fever/chills, N/V/D. Eating and drinking well.   ABI done in 02/2021. Bilateral abnormal ABI. Unchanged since May of 2022.     Denies any fever/chills, vision changes/headaches, chest pain or palpitations, shortness of breath or cough from baseline, abdominal pain, N/V/D, dysuria or leg swelling.   He does not smoke or drink alcohol.   ER Course:  vitals: afebrile, bp: 188/73, HR: 79, RR: 18, oxygen: 100%RA Pertinent labs: WBC: 11.1, hgb: 9.1, BUN: 39, creatinine: 1.76 (1.4-1.5)  Left foot xray: . Increased subcutaneous air within the distal aspect of the great toe. 2. There is thinning of the distal lateral aspect of the great toe on frontal view with possible exposure of bone to air. 3. Increased lucency which may reflect minimal early cortical thinning of the mid to distal aspect of the great toe distal phalanx. This may represent early osteomyelitis. In ED: vascular surgery consulted and given vanc. TRH asked to admit     Review of Systems: As  mentioned in the history of present illness. All other systems reviewed and are negative. Past Medical History:  Diagnosis Date   Anxiety    Arthritis    Carotid arterial disease (HCC)    CKD (chronic kidney disease)    Coronary artery disease    Diabetes mellitus without complication (HCC)    Diabetic retinopathy (HCC)    NPDR OU   Diverticulitis    Dyspnea    GERD (gastroesophageal reflux disease)    History of kidney stones 2021   Hypercholesteremia    Hypertension    Hypertensive retinopathy    OU   Myocardial infarction [facility]) 2009   Pneumonia    as a child   Prostate cancer (HCC)    UTI (lower urinary tract infection)    Past Surgical History:  Procedure Laterality Date   ABDOMINAL SURGERY     for diverticulitis; also removed appendix   APPENDECTOMY     CARDIAC CATHETERIZATION  2018   CATARACT EXTRACTION     CATARACT EXTRACTION, BILATERAL     CHOLECYSTECTOMY N/A 10/12/2017   Procedure: LAPAROSCOPIC CHOLECYSTECTOMY;  Surgeon: Abigail Miyamoto, MD;  Location: [facility] OR;  Service: General;  Laterality: N/A;   CORONARY ARTERY BYPASS GRAFT  2009   ERCP N/A 12/21/2018   Procedure: ENDOSCOPIC RETROGRADE CHOLANGIOPANCREATOGRAPHY (ERCP);  Surgeon: Jeani Hawking, MD;  Location: [facility] ENDOSCOPY;  Service: Endoscopy;  Laterality: N/A;   ESOPHAGOGASTRODUODENOSCOPY (EGD) WITH PROPOFOL N/A 12/17/2018  Procedure: ESOPHAGOGASTRODUODENOSCOPY (EGD) WITH PROPOFOL;  Surgeon: Jeani Hawking, MD;  Location: [facility] ENDOSCOPY;  Service: Endoscopy;  Laterality: N/A;   EUS Left 12/17/2018   Procedure: UPPER ENDOSCOPIC ULTRASOUND (EUS) LINEAR;  Surgeon: Jeani Hawking, MD;  Location: [facility] ENDOSCOPY;  Service: Endoscopy;  Laterality: Left;   EYE SURGERY     "for bleeding in eye"   HERNIA REPAIR     LEFT HEART CATH AND CORONARY ANGIOGRAPHY N/A 11/04/2016   Procedure: Left Heart Cath and Coronary Angiography;  Surgeon: Yates Decamp, MD;  Location: [facility] INVASIVE CV LAB;  Service: Cardiovascular;  Laterality: N/A;    LOWER EXTREMITY ANGIOGRAPHY N/A 11/04/2016   Procedure: Lower Extremity Angiography;  Surgeon: Yates Decamp, MD;  Location: [facility] INVASIVE CV LAB;  Service: Cardiovascular;  Laterality: N/A;   LOWER EXTREMITY ANGIOGRAPHY N/A 01/03/2020   Procedure: LOWER EXTREMITY ANGIOGRAPHY;  Surgeon: Yates Decamp, MD;  Location: [facility] INVASIVE CV LAB;  Service: Cardiovascular;  Laterality: N/A;   LOWER EXTREMITY ANGIOGRAPHY Bilateral 01/14/2022   Procedure: Lower Extremity Angiography;  Surgeon: Yates Decamp, MD;  Location: [facility] INVASIVE CV LAB;  Service: Cardiovascular;  Laterality: Bilateral;   LUMBAR LAMINECTOMY/DECOMPRESSION MICRODISCECTOMY Bilateral 11/09/2020   Procedure: Laminectomy and Foraminotomy - bilateral - Lumbar Four-Five.;  Surgeon: Julio Sicks, MD;  Location: [facility] OR;  Service: Neurosurgery;  Laterality: Bilateral;  posterior   PROSTATE SURGERY     REMOVAL OF STONES  12/21/2018   Procedure: REMOVAL OF STONES;  Surgeon: Jeani Hawking, MD;  Location: [facility] ENDOSCOPY;  Service: Endoscopy;;   SPHINCTEROTOMY  12/21/2018   Procedure: Dennison Mascot;  Surgeon: Jeani Hawking, MD;  Location: [facility] ENDOSCOPY;  Service: Endoscopy;;   Social History:  reports that he has quit smoking. His smoking use included cigarettes. He has a 0.5 pack-year smoking history. He has never used smokeless tobacco. He reports that he does not drink alcohol and does not use drugs.  Allergies  Allergen Reactions   Contrast Media [Iodinated Contrast Media] Itching    PT STATES ALLERGY TO "CT DYE".   Pletal [Cilostazol] Diarrhea   Amlodipine Swelling    Leg swelling   Dye Fdc Red [Red Dye #40 (Allura Red)] Swelling    CAT scan dye   Ibuprofen Other (See Comments)    Upset stomach     Family History  Problem Relation Age of Onset   Diabetes Father    Diabetes Maternal Aunt    Diabetes Maternal Uncle    Diabetes Maternal Grandmother    Heart disease Sister    Diabetes Brother    Heart disease Sister     Prior to Admission medications    Medication Sig Start Date End Date Taking? Authorizing Provider  ACCU-CHEK SMARTVIEW test strip  03/27/19   [provider]  Accu-Chek Softclix Lancets lancets  05/18/20   [provider]  acetaminophen (TYLENOL) 500 MG tablet Take 1,000 mg by mouth 2 (two) times daily as needed for mild pain.    [provider]  aspirin (ASPIRIN CHILDRENS) 81 MG chewable tablet Chew 1 tablet (81 mg total) by mouth daily. 03/16/23   Yates Decamp, MD  Blood Glucose Monitoring Suppl (ACCU-CHEK GUIDE) w/Device KIT  05/08/20   [provider]  cholecalciferol (VITAMIN D3) 25 MCG (1000 UNIT) tablet Take 1,000 Units by mouth daily.    [provider]  clonazePAM (KLONOPIN) 0.5 MG tablet Take 0.5 mg by mouth daily as needed for anxiety.    [provider]  docusate sodium (COLACE) 50 MG capsule Take 50 mg by  mouth 2 (two) times daily.    [provider]  DULoxetine (CYMBALTA) 30 MG capsule Take 2 capsules by mouth once daily 04/06/23   Yates Decamp, MD  ezetimibe (ZETIA) 10 MG tablet TAKE 1 TABLET DAILY AFTER SUPPER Patient taking differently: Take 10 mg by mouth every evening. 03/27/21   Yates Decamp, MD  furosemide (LASIX) 40 MG tablet Take 40 mg by mouth.    [provider]  gabapentin (NEURONTIN) 300 MG capsule Take 300 mg by mouth 3 (three) times daily. 07/16/21   [provider]  HYDROcodone-acetaminophen (NORCO/VICODIN) 5-325 MG tablet Take 1 tablet by mouth every 6 (six) hours as needed for moderate pain.    [provider]  labetalol (NORMODYNE) 100 MG tablet Take 1 tablet (100 mg total) by mouth 2 (two) times daily. 03/16/23   Yates Decamp, MD  LANTUS SOLOSTAR 100 UNIT/ML Solostar Pen Inject 10 Units into the skin 2 (two) times daily. Inject 10 units in the morning and 10 units in the evening. 09/10/16   [provider]  Lidocaine-Benzalkonium 4-0.13 % GEL Apply 1 Pump topically in the morning, at noon, in the evening, and at  bedtime. 05/22/23   Yates Decamp, MD  meclizine (ANTIVERT) 25 MG tablet Take 25 mg by mouth 2 (two) times daily. 06/16/21   [provider]  nitroGLYCERIN (NITRODUR - DOSED IN MG/24 HR) 0.2 mg/hr patch Place 1 patch (0.2 mg total) onto the skin daily. 03/16/23   Yates Decamp, MD  omeprazole (PRILOSEC) 40 MG capsule Take 40 mg by mouth daily.  11/22/18   [provider]  Pyridoxine HCl (B-6 PO) Take 1 capsule by mouth daily.    [provider]  rivaroxaban (XARELTO) 2.5 MG TABS tablet Take 1 tablet (2.5 mg total) by mouth 2 (two) times daily. 03/16/23   Yates Decamp, MD  ropinirole (REQUIP) 5 MG tablet Take 5 mg by mouth at bedtime.    [provider]  simvastatin (ZOCOR) 20 MG tablet Take 20 mg by mouth at bedtime. 01/26/20   [provider]  spironolactone-hydrochlorothiazide (ALDACTAZIDE) 25-25 MG tablet TAKE 1/2 (ONE-HALF) TABLET BY MOUTH IN THE MORNING 04/22/23   Yates Decamp, MD  tamsulosin (FLOMAX) 0.4 MG CAPS capsule TAKE 1 CAPSULE BY MOUTH ONCE DAILY AFTER SUPPER 04/14/23   Yates Decamp, MD  vitamin B-12 (CYANOCOBALAMIN) 500 MCG tablet Take 500 mcg by mouth daily.    [provider]  Vitamin E 268 MG (400 UNIT) CAPS Take 400 Units by mouth daily.    [provider]    Physical Exam: Vitals:   06/19/23 1315 06/19/23 1449 06/19/23 1657 06/19/23 1800  BP:  (!) 101/49 (!) 170/68 (!) 191/68  Pulse:  70 (!) 58 (!) 57  Resp:  18 18 16   Temp:  98.5 F (36.9 C) 97.6 F (36.4 C) 97.9 F (36.6 C)  TempSrc:   Oral Oral  SpO2:  99% 99% 100%  Weight: 71.2 kg     Height: 5\' 7"  (1.702 m)      General:  Appears calm and comfortable and is in NAD Eyes:  PERRL, EOMI, normal lids, iris ENT:  grossly normal hearing, lips & tongue, mmm; appropriate dentition Neck:  no LAD, masses or thyromegaly; no carotid bruits Cardiovascular:  RRR, no m/r/g. No LE edema.  Respiratory:   CTA bilaterally with no wheezes/rales/rhonchi.  Normal respiratory  effort. Abdomen:  soft, NT, ND, NABS Back:   normal alignment, no CVAT Skin:  no rash or induration  seen on limited exam Musculoskeletal:  grossly normal tone BUE/BLE, good ROM, no bony abnormality Lower extremity:left lower foot big toe. Large, black eschar with opening and drainage/blood. Left big toe cold to touch, rest of foot warm. Nonpalpable pedal pulses.     Psychiatric:  grossly normal mood and affect, speech fluent and appropriate, AOx3 Neurologic:  CN 2-12 grossly intact, moves all extremities in coordinated fashion, sensation intact   Radiological Exams on Admission: Independently reviewed - see discussion in A/P where applicable  DG Foot Complete Left  Result Date: 06/19/2023 CLINICAL DATA:  Left great toe osteomyelitis. Gangrene of left great toe for 6 months. EXAM: LEFT FOOT - COMPLETE 3+ VIEW COMPARISON:  Left foot radiographs 05/22/2023 FINDINGS: There is again diffuse decreased bone mineralization. There is increased lucency overlying the distal great toe soft tissues indicating subcutaneous air around the distal phalanx. There is thinning of the distal lateral aspect of the great toe on frontal view with possible exposure of bone to air. There is attenuation of the distal medial aspect of the great toe soft tissues on frontal and oblique view. Overlap of the toes lateral view limits evaluation of the great toe soft tissues. There is a increased lucency which may reflect minimal early cortical thinning of the mid to distal aspect of the great toe distal phalanx. Tiny plantar calcaneal heel spur. High-grade atherosclerotic calcifications. IMPRESSION: 1. Increased subcutaneous air within the distal aspect of the great toe. 2. There is thinning of the distal lateral aspect of the great toe on frontal view with possible exposure of bone to air. 3. Increased lucency which may reflect minimal early cortical thinning of the mid to distal aspect of the great toe distal phalanx. This may  represent early osteomyelitis. Electronically Signed   By: Neita Garnet M.D.   On: 06/19/2023 14:16      Labs on Admission: I have personally reviewed the available labs and imaging studies at the time of the admission.  Pertinent labs:   WBC: 11.1,  hgb: 9.1,  BUN: 39,  creatinine: 1.76 (1.4-1.5)   Assessment and Plan: Principal Problem:   Gangrene of extremity (HCC) Active Problems:   Claudication in peripheral vascular disease (HCC)   Normocytic anemia   CAD s/p CABG in 2009   CKD stage 3 due to type 2 diabetes mellitus (HCC)   Diabetes mellitus without complication (HCC)   Asymptomatic bilateral carotid artery stenosis   Primary hypertension   GERD (gastroesophageal reflux disease)   HLD (hyperlipidemia)    Assessment and Plan: * Gangrene of extremity (HCC) 89 year old male presenting with chronic left great toe ulcer x1.5 years that has had worsening odor, pus and bloody drainage with increased pain with possible early osteomyelitis by xray -admit to med -surg -vascular surgery and ortho ([facility]) consulted  -check ESR/CRP -no SIRS criteria, but will continue broad spectrum abx with hx of T2DM and concern for OM  -likely needs amputation  -ABI done in 02/2023. Abnormal, but unchanged since 01/2021. Mild small vessel peripheral arterial disease by angiography in 12/2021  -continue home pain control    Claudication in peripheral vascular disease (HCC) patent graft except occluded SVG to RCA which is diffusely diseased small vessel and also severe below-knee bilateral lower disease by angiography on 11/04/2016 and mild small vessel peripheral arterial disease especially in the left leg angiography in April 2023.   ABI 03/19/2023: Right ABI noncompressible.  Abnormal monophasic waveform in PT and DP.  TBI 0.51 abnormal. Left ABI  noncompressible.  Abnormal monophasic waveform in PT and DP.  TBI abnormal at the left big toe. Compared to prior study 02/11/2021, left TBI was  0.42.  This represents inadequate perfusion for healing at the big toe. No significant change from 02/11/2021.  Vascular surgery consulted by EDP Continue medical management    Normocytic anemia Likely due to ACD Check iron studies   CAD s/p CABG in 2009 Continue medical therapy Labetalol, ASA, zetia, zocor   CKD stage 3 due to type 2 diabetes mellitus (HCC) Recent labs show creatinine at 1.6 (baseline 1.4-1.6)  Stable, continue to monitor  Gentle, time limited IVF Followed by nephrology  Trend   Diabetes mellitus without complication (HCC) No recent A1C, pending Sensitive SSI and accuchecks qac/hs   Asymptomatic bilateral carotid artery stenosis Stable on imaging. No more surveillance per cardiology  Continue medical therapy   Primary hypertension Elevated in ED Has hx of orthostatic hypotension due to diabetic dysautonomia  Continue labetalol, aldactazide  PRN hydralazine   GERD (gastroesophageal reflux disease) Continue PPI daily   HLD (hyperlipidemia) Continue zocor and zetia     Advance Care Planning:   Code Status: Full Code   Consults: vascular and ortho   DVT Prophylaxis: heparin [place]  Family Communication: daughter at bedside   Severity of Illness: The appropriate patient status for this patient is INPATIENT. Inpatient status is judged to be reasonable and necessary in order to provide the required intensity of service to ensure the patient's safety. The patient's presenting symptoms, physical exam findings, and initial radiographic and laboratory data in the context of their chronic comorbidities is felt to place them at high risk for further clinical deterioration. Furthermore, it is not anticipated that the patient will be medically stable for discharge from the hospital within 2 midnights of admission.   * I certify that at the point of admission it is my clinical judgment that the patient will require inpatient hospital care spanning beyond 2  midnights from the point of admission due to high intensity of service, high risk for further deterioration and high frequency of surveillance required.*  Author: Orland Mustard, MD 06/19/2023 6:05 PM  For on call review [email or web address].

## 2023-06-19 NOTE — Assessment & Plan Note (Signed)
Continue medical therapy Labetalol, ASA, zetia, zocor

## 2023-06-19 NOTE — Assessment & Plan Note (Signed)
Continue zocor and zetia

## 2023-06-20 DIAGNOSIS — I96 Gangrene, not elsewhere classified: Secondary | ICD-10-CM | POA: Diagnosis not present

## 2023-06-20 DIAGNOSIS — M869 Osteomyelitis, unspecified: Secondary | ICD-10-CM

## 2023-06-20 LAB — BASIC METABOLIC PANEL
Anion gap: 11 (ref 5–15)
BUN: 34 mg/dL — ABNORMAL HIGH (ref 8–23)
CO2: 17 mmol/L — ABNORMAL LOW (ref 22–32)
Calcium: 9 mg/dL (ref 8.9–10.3)
Chloride: 107 mmol/L (ref 98–111)
Creatinine, Ser: 1.56 mg/dL — ABNORMAL HIGH (ref 0.61–1.24)
GFR, Estimated: 42 mL/min — ABNORMAL LOW (ref >60)
Glucose, Bld: 199 mg/dL — ABNORMAL HIGH (ref 70–99)
Potassium: 4.7 mmol/L (ref 3.5–5.1)
Sodium: 135 mmol/L (ref 135–145)

## 2023-06-20 LAB — CBC
HCT: 28.8 % — ABNORMAL LOW (ref 39.0–52.0)
Hemoglobin: 9 g/dL — ABNORMAL LOW (ref 13.0–17.0)
MCH: 26.5 pg (ref 26.0–34.0)
MCHC: 31.3 g/dL (ref 30.0–36.0)
MCV: 85 fL (ref 80.0–100.0)
Platelets: 256 10*3/uL (ref 150–400)
RBC: 3.39 MIL/uL — ABNORMAL LOW (ref 4.22–5.81)
RDW: 13.8 % (ref 11.5–15.5)
WBC: 10.3 10*3/uL (ref 4.0–10.5)
nRBC: 0 % (ref 0.0–0.2)

## 2023-06-20 LAB — GLUCOSE, CAPILLARY
Glucose-Capillary: 181 mg/dL — ABNORMAL HIGH (ref 70–99)
Glucose-Capillary: 119 mg/dL — ABNORMAL HIGH (ref 70–99)
Glucose-Capillary: 170 mg/dL — ABNORMAL HIGH (ref 70–99)
Glucose-Capillary: 231 mg/dL — ABNORMAL HIGH (ref 70–99)

## 2023-06-20 MED ORDER — VANCOMYCIN HCL 500 MG/100ML IV SOLN
500.0000 mg | Freq: Once | INTRAVENOUS | Status: AC
  Administered 2023-06-20: 500 mg via INTRAVENOUS
  Filled 2023-06-20: qty 100

## 2023-06-20 MED ORDER — INFLUENZA VAC A&B SURF ANT ADJ 0.5 ML IM SUSY
0.5000 mL | PREFILLED_SYRINGE | INTRAMUSCULAR | Status: DC
  Filled 2023-06-20: qty 0.5

## 2023-06-20 MED ORDER — POLYETHYLENE GLYCOL 3350 17 G PO PACK
17.0000 g | PACK | Freq: Every day | ORAL | Status: DC
  Administered 2023-06-20 – 2023-06-27 (×3): 17 g via ORAL
  Filled 2023-06-20 (×6): qty 1

## 2023-06-20 MED ORDER — SODIUM CHLORIDE 0.9 % IV SOLN
125.0000 mg | Freq: Once | INTRAVENOUS | Status: AC
  Administered 2023-06-20: 125 mg via INTRAVENOUS
  Filled 2023-06-20: qty 10

## 2023-06-20 MED ORDER — PNEUMOCOCCAL 20-VAL CONJ VACC 0.5 ML IM SUSY
0.5000 mL | PREFILLED_SYRINGE | INTRAMUSCULAR | Status: DC
  Filled 2023-06-20: qty 0.5

## 2023-06-20 MED ORDER — GABAPENTIN 100 MG PO CAPS
100.0000 mg | ORAL_CAPSULE | Freq: Three times a day (TID) | ORAL | Status: DC
  Administered 2023-06-20 – 2023-06-21 (×3): 100 mg via ORAL
  Filled 2023-06-20 (×3): qty 1

## 2023-06-20 MED ORDER — VANCOMYCIN HCL 750 MG/150ML IV SOLN
750.0000 mg | INTRAVENOUS | Status: DC
  Administered 2023-06-21 – 2023-06-22 (×2): 750 mg via INTRAVENOUS
  Filled 2023-06-20 (×2): qty 150

## 2023-06-20 MED ORDER — SENNA 8.6 MG PO TABS
1.0000 | ORAL_TABLET | Freq: Two times a day (BID) | ORAL | Status: DC
  Administered 2023-06-20 – 2023-06-27 (×5): 8.6 mg via ORAL
  Filled 2023-06-20 (×8): qty 1

## 2023-06-20 NOTE — Progress Notes (Addendum)
Pharmacy Antibiotic Note  Justin [name] is a 89 y.o. male admitted on 06/19/2023 with  osteomyelitis .  Pharmacy has been consulted for vancomycin dosing.  Patient presented with chronic left great toe ulcer that has been present for 1.5 years that has recently worsened with increased odor, pus and bloody drainage. X-ray on 9/20 revealed possible early osteomyelitis. He has been initiated on ceftriaxone 2 grams Q24h and metronidazole 500 mg Q12h. Pharmacy has been consulted to initiate vancomycin.   Of note, he received vancomycin 1 gram on 9/20 at 1800. Calculated loading dose of 1500 mg more appropriate for patient. Will plan to complete the load today and transition to maintenance vancomycin dosing tomorrow.   Patient did present with an AKI, with a Scr of 1.76. This has decreased to 1.56 today, but will continue to monitor closely.   Plan: Vancomycin 500 mg IV x 1 dose today to complete load Vancomycin 750 mg IV Q24 hours (eAUC 491, Scr 1.56) starting 9/22 Continue to monitor renal function Follow-up surgical plans and ability to narrow antibiotics as able  Height: 5\' 7"  (170.2 cm) Weight: 71.2 kg (157 lb) IBW/kg (Calculated) : 66.1  Temp (24hrs), Avg:98.1 F (36.7 C), Min:97.6 F (36.4 C), Max:98.6 F (37 C)  Recent Labs  Lab 06/19/23 1319 06/19/23 1331 06/20/23 0741  WBC 11.1*  --  10.3  CREATININE 1.76*  --  1.56*  LATICACIDVEN  --  1.6  --     Estimated Creatinine Clearance: 30.6 mL/min (A) (by C-G formula based on SCr of 1.56 mg/dL (H)).    Allergies  Allergen Reactions   Contrast Media [Iodinated Contrast Media] Itching    PT STATES ALLERGY TO "CT DYE".   Pletal [Cilostazol] Diarrhea   Amlodipine Swelling    Leg swelling   Dye Fdc Red [Red Dye #40 (Allura Red)] Swelling    CAT scan dye   Ibuprofen Other (See Comments)    Upset stomach     Antimicrobials this admission: Ceftriaxone 9/20 >>  Metronidazole 9/20 >>  Microbiology results: 9/21 BCx:  pending  Thank you for allowing pharmacy to be a part of this patient's care.  Riccardo Dubin Les Longmore 06/20/2023 8:57 AM

## 2023-06-20 NOTE — Plan of Care (Signed)

## 2023-06-20 NOTE — Consult Note (Signed)
[facility] [place]  Patient name: Justin [name] MRN: [id number] DOB: [birth year] Sex: male   REQUESTING PROVIDER:   ER   REASON FOR CONSULT:    Left great toe gangrene  HISTORY OF PRESENT ILLNESS:   Justin [name] is a 89 y.o. male, who was admitted yesterday with left great toe drainage and foul odor.  His ABIs are abnormal.  The patient has previously undergone angiography by Dr. Jacinto Halim in 2021.  He has known microvascular disease on the foot.  His wound has been present for 1-1/2 years.  Patient has a history of coronary artery disease, status post CABG in 2009.  He has a type II diabetic.  He has stage IIIa chronic renal insufficiency.  He is medically managed for hypertension.  He is on a statin for hypercholesterolemia.  PAST MEDICAL HISTORY    Past Medical History:  Diagnosis Date   Anxiety    Arthritis    Carotid arterial disease (HCC)    CKD (chronic kidney disease)    Coronary artery disease    Diabetes mellitus without complication (HCC)    Diabetic retinopathy (HCC)    NPDR OU   Diverticulitis    Dyspnea    GERD (gastroesophageal reflux disease)    History of kidney stones 2021   Hypercholesteremia    Hypertension    Hypertensive retinopathy    OU   Myocardial infarction [facility]) 2009   Pneumonia    as a child   Prostate cancer (HCC)    UTI (lower urinary tract infection)      FAMILY HISTORY   Family History  Problem Relation Age of Onset   Diabetes Father    Diabetes Maternal Aunt    Diabetes Maternal Uncle    Diabetes Maternal Grandmother    Heart disease Sister    Diabetes Brother    Heart disease Sister     SOCIAL HISTORY:   Social History   Socioeconomic History   Marital status: Widowed    Spouse name: Not on file   Number of children: 4   Years of education: Not on file   Highest education level: Not on file  Occupational History   Not on file  Tobacco Use   Smoking  status: Former    Current packs/day: 0.25    Average packs/day: 0.3 packs/day for 2.0 years (0.5 ttl pk-yrs)    Types: Cigarettes   Smokeless tobacco: Never   Tobacco comments:    when he was a teenage age [age]  Vaping Use   Vaping status: Never Used  Substance and Sexual Activity   Alcohol use: No   Drug use: No   Sexual activity: Not Currently  Other Topics Concern   Not on file  Social History Narrative   Not on file   Social Determinants of Health   Financial Resource Strain: Not on file  Food Insecurity: No Food Insecurity (06/20/2023)   Hunger Vital Sign    Worried About Running Out of Food in the Last Year: Never true    Ran Out of Food in the Last Year: Never true  Transportation Needs: No Transportation Needs (06/20/2023)   PRAPARE - Administrator, Civil Service (Medical): No    Lack of Transportation (Non-Medical): No  Physical Activity: Not on file  Stress: Not on file  Social Connections: Unknown (02/11/2022)   [facility] from [facility], [facility]   Social Network    Social Network: Not on file  Intimate Partner Violence: Not At Risk (06/20/2023)   Humiliation, Afraid, Rape, and Kick questionnaire    Fear of Current or Ex-Partner: No    Emotionally Abused: No    Physically Abused: No    Sexually Abused: No    ALLERGIES:    Allergies  Allergen Reactions   Contrast Media [Iodinated Contrast Media] Itching    PT STATES ALLERGY TO "CT DYE".   Pletal [Cilostazol] Diarrhea   Amlodipine Swelling    Leg swelling   Dye Fdc Red [Red Dye #40 (Allura Red)] Swelling    CAT scan dye   Ibuprofen Other (See Comments)    Upset stomach     CURRENT MEDICATIONS:    Current Facility-Administered Medications  Medication Dose Route Frequency Provider Last Rate Last Admin   acetaminophen (TYLENOL) tablet 650 mg  650 mg Oral Q6H PRN Orland Mustard, MD   650 mg at 06/19/23 2158   Or   acetaminophen (TYLENOL) suppository 650 mg  650 mg Rectal Q6H PRN  Orland Mustard, MD       aspirin chewable tablet 81 mg  81 mg Oral Daily Orland Mustard, MD       cefTRIAXone (ROCEPHIN) 2 g in sodium chloride 0.9 % 100 mL IVPB  2 g Intravenous Q24H Ilda Basset, RPH 200 mL/hr at 06/19/23 2128 Restarted at 06/19/23 2128   clonazePAM (KLONOPIN) tablet 0.5 mg  0.5 mg Oral Daily PRN Orland Mustard, MD       DULoxetine (CYMBALTA) DR capsule 60 mg  60 mg Oral Daily Orland Mustard, MD       ezetimibe (ZETIA) tablet 10 mg  10 mg Oral QPM Orland Mustard, MD       furosemide (LASIX) tablet 40 mg  40 mg Oral Daily Orland Mustard, MD       gabapentin (NEURONTIN) capsule 100 mg  100 mg Oral TID Orland Mustard, MD       heparin injection 5,000 Units  5,000 Units Subcutaneous Tedra Coupe Orland Mustard, MD   5,000 Units at 06/20/23 9528   hydrALAZINE (APRESOLINE) injection 5 mg  5 mg Intravenous Q8H PRN Orland Mustard, MD       spironolactone (ALDACTONE) tablet 12.5 mg  12.5 mg Oral Daily Orland Mustard, MD       And   hydrochlorothiazide (HYDRODIURIL) tablet 12.5 mg  12.5 mg Oral Daily Orland Mustard, MD       HYDROcodone-acetaminophen (NORCO/VICODIN) 5-325 MG per tablet 1 tablet  1 tablet Oral Q4H PRN Orland Mustard, MD   1 tablet at 06/20/23 0401   [START ON 06/21/2023] influenza vaccine adjuvanted (FLUAD) injection 0.5 mL  0.5 mL Intramuscular Tomorrow-1000 Orland Mustard, MD       insulin aspart (novoLOG) injection 0-9 Units  0-9 Units Subcutaneous TID WC Orland Mustard, MD   2 Units at 06/20/23 4132   insulin glargine-yfgn [facility]) injection 5 Units  5 Units Subcutaneous Daily Orland Mustard, MD       labetalol (NORMODYNE) tablet 100 mg  100 mg Oral BID Orland Mustard, MD       meclizine (ANTIVERT) tablet 25 mg  25 mg Oral BID PRN Orland Mustard, MD       metroNIDAZOLE (FLAGYL) IVPB 500 mg  500 mg Intravenous Q12H Orland Mustard, MD 100 mL/hr at 06/20/23 0810 500 mg at 06/20/23 0810   ondansetron (ZOFRAN) tablet 4 mg  4 mg Oral Q6H PRN Orland Mustard, MD       Or    ondansetron [facility]) injection 4 mg  4 mg Intravenous Q6H PRN Orland Mustard, MD       pantoprazole (PROTONIX) EC tablet 80 mg  80 mg Oral Daily Orland Mustard, MD       [START ON 06/21/2023] pneumococcal 20-valent conjugate vaccine (PREVNAR 20) injection 0.5 mL  0.5 mL Intramuscular Tomorrow-1000 Orland Mustard, MD       rOPINIRole (REQUIP) tablet 5 mg  5 mg Oral QHS Orland Mustard, MD       simvastatin (ZOCOR) tablet 20 mg  20 mg Oral QHS Orland Mustard, MD   20 mg at 06/19/23 2323   tamsulosin (FLOMAX) capsule 0.4 mg  0.4 mg Oral QPC supper Orland Mustard, MD       vancomycin (VANCOREADY) IVPB 500 mg/100 mL  500 mg Intravenous Once Vivia Birmingham, RPH       [START ON 06/21/2023] vancomycin (VANCOREADY) IVPB 750 mg/150 mL  750 mg Intravenous Q24H Klus, Jesse L, RPH        REVIEW OF SYSTEMS:   [X]  denotes positive finding, [ ]  denotes negative finding Cardiac  Comments:  Chest pain or chest pressure:    Shortness of breath upon exertion:    Short of breath when lying flat:    Irregular heart rhythm:        Vascular    Pain in calf, thigh, or hip brought on by ambulation: x   Pain in feet at night that wakes you up from your sleep:     Blood clot in your veins:    Leg swelling:         Pulmonary    Oxygen at home:    Productive cough:     Wheezing:         Neurologic    Sudden weakness in arms or legs:     Sudden numbness in arms or legs:     Sudden onset of difficulty speaking or slurred speech:    Temporary loss of vision in one eye:     Problems with dizziness:         Gastrointestinal    Blood in stool:      Vomited blood:         Genitourinary    Burning when urinating:     Blood in urine:        Psychiatric    Major depression:         Hematologic    Bleeding problems:    Problems with blood clotting too easily:        Skin    Rashes or ulcers: x       Constitutional    Fever or chills:     PHYSICAL EXAM:   Vitals:   06/19/23 1934 06/20/23 0108  06/20/23 0516 06/20/23 0803  BP: 130/89 (!) 168/68 (!) 153/46 (!) 158/59  Pulse: 62 (!) 56 63 72  Resp: 17 17 18    Temp: 98.1 F (36.7 C) 98.4 F (36.9 C) 97.8 F (36.6 C) 98.2 F (36.8 C)  TempSrc: Oral   Oral  SpO2: 100% 98% 100% 100%  Weight:      Height:        GENERAL: The patient is a well-nourished male, in no acute distress. The vital signs are documented above. CARDIAC: There is a regular rate and rhythm.  VASCULAR: Palpable femoral pulses, nonpalpable pedal pulses PULMONARY: Nonlabored respirations ABDOMEN: Soft and non-tender with normal pitched bowel sounds.  MUSCULOSKELETAL: There are no major deformities or cyanosis. NEUROLOGIC: No focal weakness or paresthesias  are detected. SKIN: There are no ulcers or rashes noted. PSYCHIATRIC: The patient has a normal affect.     STUDIES:   I have reviewed his vascular studies with the following findings: ABI/TBIToday's ABIToday's TBIPrevious ABIPrevious TBI  +-------+-----------+-----------+------------+------------+  Right [place]         0.51       [place]          0.59          +-------+-----------+-----------+------------+------------+  Left  [place]         absent     [place]          0.42          +-------+-----------+-----   ASSESSMENT and PLAN   Left great toe gangrene in the setting of peripheral vascular disease: The patient is currently under the care of Dr. Jacinto Halim for this problem.  He has performed angiography in 2021 and most recently saw him over the summer.  I have reached out to Dr. Jacinto Halim, and I will have him resume care for his peripheral vascular disease.  Vascular surgery will sign off   Charlena Cross, MD, FACS [facility] [phone] Pager [phone]

## 2023-06-20 NOTE — Progress Notes (Signed)
PROGRESS NOTE  GOLDIE SANSOM  ZOX:[id number] DOB: 1932-11-27 DOA: 06/19/2023 PCP: Estevan Oaks, NP   Brief Narrative: Patient is a 89 year old male with history of diabetes type 2, GERD, hypertension, hyperlipidemia, coronary disease status post CABG, peripheral artery disease, history of prostate cancer, CKD stage IIIb who presented to the emergency room with complaint of worsening ulcer on his left great toe.  Over the past week, the patient observed drainage of pus and blood when the dressing was being changed, had foul smell.  No history of fever or chills.  On presentation he was hypertensive, afebrile.  Lab work showed creatinine of 1.76, baseline creatinine of 1.4-1.5.  Left foot x-ray showed increased subcutaneous air within the distal aspect of the great toe suspicious for early osteomyelitis.  Vascular surgery, orthopedics consulted.  Started on broad-spectrum antibiotics  Assessment & Plan:  Principal Problem:   Gangrene of extremity (HCC) Active Problems:   Claudication in peripheral vascular disease (HCC)   Normocytic anemia   CAD s/p CABG in 2009   CKD stage 3 due to type 2 diabetes mellitus (HCC)   Diabetes mellitus without complication (HCC)   Asymptomatic bilateral carotid artery stenosis   Primary hypertension   GERD (gastroesophageal reflux disease)   HLD (hyperlipidemia)   Gangrene of left lower extremity: Patient presented with  complaint of worsening ulcer on his left great toe.  Over the past week, the patient observed drainage of pus and blood when the dressing was being changed, had foul smell.  X-rays suggestive of  of osteomyelitis.  Vascular surgery, orthopedics consulted.  Continue broad-spectrum antibiotics.  Might need amputation.  Orthopedics requesting for endovascular evaluation to determine if he has foot salvage options.  Vascular surgery has already notified Dr. Jacinto Halim.  Peripheral vascular disease: Follows with Dr Jacinto Halim, has been notified by  vascular surgery   Normocytic anemia: Likely from chronic medical conditions.  Hemoglobin stable.  Iron of 26.  Will supplement with IV iron  Coronary artery disease: Status post CABG, Anginal Symptoms.  On Lipitor, aspirin, Zetia, Zocor  CKD stage IIIb: Baseline creatinine 1.4-1.6.  Currently kidney function near  at baseline.  Follows with nephrology as an outpatient  Diabetes type 2: A1c of 8.8.  Monitor blood sugars.  Continue sliding scale  insulin.  Bilateral carotid artery stenosis: Follows with cardiology  Hypertension: Monitor blood pressure.  On labetalol, Lasix, spironolactone, hydrochlorothiazide  Hyperlipidemia: Continue zokor, Zetia  GERD: Continue PPI          DVT prophylaxis:heparin injection 5,000 Units Start: 06/19/23 2200     Code Status: Full Code  Family Communication: Called and discussed with daughter Eber Jones on phone on 9/21  Patient status:Inpatient  Patient is from :Home  Anticipated discharge WJ:XBJY  Estimated DC date: After determination of foot intervention   Consultants: vascular surgery  Procedures:None yet  Antimicrobials:  Anti-infectives (From admission, onward)    Start     Dose/Rate Route Frequency Ordered Stop   06/19/23 2000  metroNIDAZOLE (FLAGYL) IVPB 500 mg        500 mg 100 mL/hr over 60 Minutes Intravenous Every 12 hours 06/19/23 1805     06/19/23 1930  cefTRIAXone (ROCEPHIN) 2 g in sodium chloride 0.9 % 100 mL IVPB        2 g 200 mL/hr over 30 Minutes Intravenous Every 24 hours 06/19/23 1839     06/19/23 1900  cefTRIAXone (ROCEPHIN) 1 g in sodium chloride 0.9 % 100 mL IVPB  Status:  Discontinued  1 g 200 mL/hr over 30 Minutes Intravenous Every 24 hours 06/19/23 1805 06/19/23 1832   06/19/23 1630  vancomycin (VANCOCIN) IVPB 1000 mg/200 mL premix        1,000 mg 200 mL/hr over 60 Minutes Intravenous  Once 06/19/23 1615 06/19/23 1800       Subjective: Patient is and examined the bedside today.  Overall he  is comfortable, lying in bed.  Very pleasant gentleman.  Complains of some shocklike discomfort on the left toe  Objective: Vitals:   06/19/23 1934 06/20/23 0108 06/20/23 0516 06/20/23 0803  BP: 130/89 (!) 168/68 (!) 153/46 (!) 158/59  Pulse: 62 (!) 56 63 72  Resp: 17 17 18    Temp: 98.1 F (36.7 C) 98.4 F (36.9 C) 97.8 F (36.6 C) 98.2 F (36.8 C)  TempSrc: Oral   Oral  SpO2: 100% 98% 100% 100%  Weight:      Height:        Intake/Output Summary (Last 24 hours) at 06/20/2023 9562 Last data filed at 06/20/2023 0426 Gross per 24 hour  Intake 776.6 ml  Output --  Net 776.6 ml   Filed Weights   06/19/23 1315  Weight: 71.2 kg    Examination:  General exam: Overall comfortable, not in distress, pleasant elderly male HEENT: PERRL Respiratory system:  no wheezes or crackles  Cardiovascular system: S1 & S2 heard, RRR.  Gastrointestinal system: Abdomen is nondistended, soft and nontender. Central nervous system: Alert and oriented Extremities: No edema, no clubbing ,no cyanosis, black left toe Skin: No rashes, no ulcers,no icterus     Data Reviewed: I have personally reviewed following labs and imaging studies  CBC: Recent Labs  Lab 06/19/23 1319  WBC 11.1*  NEUTROABS 8.7*  HGB 9.1*  HCT 28.4*  MCV 87.7  PLT 250   Basic Metabolic Panel: Recent Labs  Lab 06/19/23 1319  NA 135  K 4.7  CL 106  CO2 19*  GLUCOSE 233*  BUN 39*  CREATININE 1.76*  CALCIUM 9.0     No results found for this or any previous visit (from the past 240 hour(s)).   Radiology Studies: DG Foot Complete Left  Result Date: 06/19/2023 CLINICAL DATA:  Left great toe osteomyelitis. Gangrene of left great toe for 6 months. EXAM: LEFT FOOT - COMPLETE 3+ VIEW COMPARISON:  Left foot radiographs 05/22/2023 FINDINGS: There is again diffuse decreased bone mineralization. There is increased lucency overlying the distal great toe soft tissues indicating subcutaneous air around the distal phalanx.  There is thinning of the distal lateral aspect of the great toe on frontal view with possible exposure of bone to air. There is attenuation of the distal medial aspect of the great toe soft tissues on frontal and oblique view. Overlap of the toes lateral view limits evaluation of the great toe soft tissues. There is a increased lucency which may reflect minimal early cortical thinning of the mid to distal aspect of the great toe distal phalanx. Tiny plantar calcaneal heel spur. High-grade atherosclerotic calcifications. IMPRESSION: 1. Increased subcutaneous air within the distal aspect of the great toe. 2. There is thinning of the distal lateral aspect of the great toe on frontal view with possible exposure of bone to air. 3. Increased lucency which may reflect minimal early cortical thinning of the mid to distal aspect of the great toe distal phalanx. This may represent early osteomyelitis. Electronically Signed   By: Neita Garnet M.D.   On: 06/19/2023 14:16    Scheduled Meds:  aspirin  81 mg Oral Daily   DULoxetine  60 mg Oral Daily   ezetimibe  10 mg Oral QPM   furosemide  40 mg Oral Daily   gabapentin  100 mg Oral TID   heparin injection (subcutaneous)  5,000 Units Subcutaneous Q8H   spironolactone  12.5 mg Oral Daily   And   hydrochlorothiazide  12.5 mg Oral Daily   [START ON 06/21/2023] influenza vaccine adjuvanted  0.5 mL Intramuscular Tomorrow-1000   insulin aspart  0-9 Units Subcutaneous TID WC   insulin glargine-yfgn  5 Units Subcutaneous Daily   labetalol  100 mg Oral BID   pantoprazole  80 mg Oral Daily   [START ON 06/21/2023] pneumococcal 20-valent conjugate vaccine  0.5 mL Intramuscular Tomorrow-1000   ropinirole  5 mg Oral QHS   simvastatin  20 mg Oral QHS   tamsulosin  0.4 mg Oral QPC supper   Continuous Infusions:  cefTRIAXone (ROCEPHIN)  IV 200 mL/hr at 06/19/23 2128   metronidazole 500 mg (06/20/23 0810)     LOS: 1 day   Burnadette Pop, MD [facility] P9/21/2024, 8:22 AM

## 2023-06-20 NOTE — Consult Note (Signed)
ORTHOPAEDIC CONSULTATION  REQUESTING PHYSICIAN: Burnadette Pop, MD  Chief Complaint: Pain and dry gangrene left great toe.  HPI: Justin [name] is a 89 y.o. male who presents with chronic peripheral vascular disease with progressive dry gangrenous changes of the left great toe.  Patient reports a sharp burning pain.  Past Medical History:  Diagnosis Date   Anxiety    Arthritis    Carotid arterial disease (HCC)    CKD (chronic kidney disease)    Coronary artery disease    Diabetes mellitus without complication (HCC)    Diabetic retinopathy (HCC)    NPDR OU   Diverticulitis    Dyspnea    GERD (gastroesophageal reflux disease)    History of kidney stones 2021   Hypercholesteremia    Hypertension    Hypertensive retinopathy    OU   Myocardial infarction [facility]) 2009   Pneumonia    as a child   Prostate cancer (HCC)    UTI (lower urinary tract infection)    Past Surgical History:  Procedure Laterality Date   ABDOMINAL SURGERY     for diverticulitis; also removed appendix   APPENDECTOMY     CARDIAC CATHETERIZATION  2018   CATARACT EXTRACTION     CATARACT EXTRACTION, BILATERAL     CHOLECYSTECTOMY N/A 10/12/2017   Procedure: LAPAROSCOPIC CHOLECYSTECTOMY;  Surgeon: Abigail Miyamoto, MD;  Location: [facility] OR;  Service: General;  Laterality: N/A;   CORONARY ARTERY BYPASS GRAFT  2009   ERCP N/A 12/21/2018   Procedure: ENDOSCOPIC RETROGRADE CHOLANGIOPANCREATOGRAPHY (ERCP);  Surgeon: Jeani Hawking, MD;  Location: [facility] ENDOSCOPY;  Service: Endoscopy;  Laterality: N/A;   ESOPHAGOGASTRODUODENOSCOPY (EGD) WITH PROPOFOL N/A 12/17/2018   Procedure: ESOPHAGOGASTRODUODENOSCOPY (EGD) WITH PROPOFOL;  Surgeon: Jeani Hawking, MD;  Location: [facility] ENDOSCOPY;  Service: Endoscopy;  Laterality: N/A;   EUS Left 12/17/2018   Procedure: UPPER ENDOSCOPIC ULTRASOUND (EUS) LINEAR;  Surgeon: Jeani Hawking, MD;  Location: [facility] ENDOSCOPY;  Service: Endoscopy;  Laterality: Left;   EYE SURGERY     "for bleeding in  eye"   HERNIA REPAIR     LEFT HEART CATH AND CORONARY ANGIOGRAPHY N/A 11/04/2016   Procedure: Left Heart Cath and Coronary Angiography;  Surgeon: Yates Decamp, MD;  Location: [facility] INVASIVE CV LAB;  Service: Cardiovascular;  Laterality: N/A;   LOWER EXTREMITY ANGIOGRAPHY N/A 11/04/2016   Procedure: Lower Extremity Angiography;  Surgeon: Yates Decamp, MD;  Location: [facility] INVASIVE CV LAB;  Service: Cardiovascular;  Laterality: N/A;   LOWER EXTREMITY ANGIOGRAPHY N/A 01/03/2020   Procedure: LOWER EXTREMITY ANGIOGRAPHY;  Surgeon: Yates Decamp, MD;  Location: [facility] INVASIVE CV LAB;  Service: Cardiovascular;  Laterality: N/A;   LOWER EXTREMITY ANGIOGRAPHY Bilateral 01/14/2022   Procedure: Lower Extremity Angiography;  Surgeon: Yates Decamp, MD;  Location: [facility] INVASIVE CV LAB;  Service: Cardiovascular;  Laterality: Bilateral;   LUMBAR LAMINECTOMY/DECOMPRESSION MICRODISCECTOMY Bilateral 11/09/2020   Procedure: Laminectomy and Foraminotomy - bilateral - Lumbar Four-Five.;  Surgeon: Julio Sicks, MD;  Location: [facility] OR;  Service: Neurosurgery;  Laterality: Bilateral;  posterior   PROSTATE SURGERY     REMOVAL OF STONES  12/21/2018   Procedure: REMOVAL OF STONES;  Surgeon: Jeani Hawking, MD;  Location: [facility] ENDOSCOPY;  Service: Endoscopy;;   SPHINCTEROTOMY  12/21/2018   Procedure: Dennison Mascot;  Surgeon: Jeani Hawking, MD;  Location: [facility] ENDOSCOPY;  Service: Endoscopy;;   Social History   Socioeconomic History   Marital status: Widowed    Spouse name: Not on file   Number of children: 4   Years of  education: Not on file   Highest education level: Not on file  Occupational History   Not on file  Tobacco Use   Smoking status: Former    Current packs/day: 0.25    Average packs/day: 0.3 packs/day for 2.0 years (0.5 ttl pk-yrs)    Types: Cigarettes   Smokeless tobacco: Never   Tobacco comments:    when he was a teenage age [age]  Vaping Use   Vaping status: Never Used  Substance and Sexual Activity   Alcohol use: No   Drug  use: No   Sexual activity: Not Currently  Other Topics Concern   Not on file  Social History Narrative   Not on file   Social Determinants of Health   Financial Resource Strain: Not on file  Food Insecurity: No Food Insecurity (06/20/2023)   Hunger Vital Sign    Worried About Running Out of Food in the Last Year: Never true    Ran Out of Food in the Last Year: Never true  Transportation Needs: No Transportation Needs (06/20/2023)   PRAPARE - Administrator, Civil Service (Medical): No    Lack of Transportation (Non-Medical): No  Physical Activity: Not on file  Stress: Not on file  Social Connections: Unknown (02/11/2022)   Received from [facility]   Social Network    Social Network: Not on file   Family History  Problem Relation Age of Onset   Diabetes Father    Diabetes Maternal Aunt    Diabetes Maternal Uncle    Diabetes Maternal Grandmother    Heart disease Sister    Diabetes Brother    Heart disease Sister    - negative except otherwise stated in the family history section Allergies  Allergen Reactions   Contrast Media [Iodinated Contrast Media] Itching    PT STATES ALLERGY TO "CT DYE".   Pletal [Cilostazol] Diarrhea   Amlodipine Swelling    Leg swelling   Dye Fdc Red [Red Dye #40 (Allura Red)] Swelling    CAT scan dye   Ibuprofen Other (See Comments)    Upset stomach    Prior to Admission medications   Medication Sig Start Date End Date Taking? Authorizing Provider  aspirin (ASPIRIN CHILDRENS) 81 MG chewable tablet Chew 1 tablet (81 mg total) by mouth daily. 03/16/23   Yates Decamp, MD  cholecalciferol (VITAMIN D3) 25 MCG (1000 UNIT) tablet Take 1,000 Units by mouth daily.    [provider]  clonazePAM (KLONOPIN) 0.5 MG tablet Take 0.5 mg by mouth daily as needed for anxiety.    [provider]  docusate sodium (COLACE) 50 MG capsule Take 50 mg by mouth 2 (two) times daily.    [provider]  DULoxetine  (CYMBALTA) 30 MG capsule Take 2 capsules by mouth once daily 04/06/23   Yates Decamp, MD  ezetimibe (ZETIA) 10 MG tablet TAKE 1 TABLET DAILY AFTER SUPPER Patient taking differently: Take 10 mg by mouth every evening. 03/27/21   Yates Decamp, MD  furosemide (LASIX) 40 MG tablet Take 40 mg by mouth.    [provider]  gabapentin (NEURONTIN) 300 MG capsule Take 300 mg by mouth 3 (three) times daily. 07/16/21   [provider]  HYDROcodone-acetaminophen (NORCO/VICODIN) 5-325 MG tablet Take 1 tablet by mouth every 6 (six) hours as needed for moderate pain.    [provider]  labetalol (NORMODYNE) 100 MG tablet Take 1 tablet (100 mg total) by mouth 2 (two) times daily. 03/16/23  Yates Decamp, MD  LANTUS SOLOSTAR 100 UNIT/ML Solostar Pen Inject 10 Units into the skin 2 (two) times daily. Inject 10 units in the morning and 10 units in the evening. 09/10/16   [provider]  Lidocaine-Benzalkonium 4-0.13 % GEL Apply 1 Pump topically in the morning, at noon, in the evening, and at bedtime. 05/22/23   Yates Decamp, MD  meclizine (ANTIVERT) 25 MG tablet Take 25 mg by mouth 2 (two) times daily. 06/16/21   [provider]  nitroGLYCERIN (NITRODUR - DOSED IN MG/24 HR) 0.2 mg/hr patch Place 1 patch (0.2 mg total) onto the skin daily. 03/16/23   Yates Decamp, MD  omeprazole (PRILOSEC) 40 MG capsule Take 40 mg by mouth daily.  11/22/18   [provider]  Pyridoxine HCl (B-6 PO) Take 1 capsule by mouth daily.    [provider]  rivaroxaban (XARELTO) 2.5 MG TABS tablet Take 1 tablet (2.5 mg total) by mouth 2 (two) times daily. 03/16/23   Yates Decamp, MD  ropinirole (REQUIP) 5 MG tablet Take 5 mg by mouth at bedtime.    [provider]  simvastatin (ZOCOR) 20 MG tablet Take 20 mg by mouth at bedtime. 01/26/20   [provider]  spironolactone-hydrochlorothiazide (ALDACTAZIDE) 25-25 MG tablet TAKE 1/2 (ONE-HALF) TABLET BY MOUTH IN THE MORNING 04/22/23    Yates Decamp, MD  tamsulosin (FLOMAX) 0.4 MG CAPS capsule TAKE 1 CAPSULE BY MOUTH ONCE DAILY AFTER SUPPER 04/14/23   Yates Decamp, MD  vitamin B-12 (CYANOCOBALAMIN) 500 MCG tablet Take 500 mcg by mouth daily.    [provider]  Vitamin E 268 MG (400 UNIT) CAPS Take 400 Units by mouth daily.    [provider]   DG Foot Complete Left  Result Date: 06/19/2023 CLINICAL DATA:  Left great toe osteomyelitis. Gangrene of left great toe for 6 months. EXAM: LEFT FOOT - COMPLETE 3+ VIEW COMPARISON:  Left foot radiographs 05/22/2023 FINDINGS: There is again diffuse decreased bone mineralization. There is increased lucency overlying the distal great toe soft tissues indicating subcutaneous air around the distal phalanx. There is thinning of the distal lateral aspect of the great toe on frontal view with possible exposure of bone to air. There is attenuation of the distal medial aspect of the great toe soft tissues on frontal and oblique view. Overlap of the toes lateral view limits evaluation of the great toe soft tissues. There is a increased lucency which may reflect minimal early cortical thinning of the mid to distal aspect of the great toe distal phalanx. Tiny plantar calcaneal heel spur. High-grade atherosclerotic calcifications. IMPRESSION: 1. Increased subcutaneous air within the distal aspect of the great toe. 2. There is thinning of the distal lateral aspect of the great toe on frontal view with possible exposure of bone to air. 3. Increased lucency which may reflect minimal early cortical thinning of the mid to distal aspect of the great toe distal phalanx. This may represent early osteomyelitis. Electronically Signed   By: Neita Garnet M.D.   On: 06/19/2023 14:16   - pertinent xrays, CT, MRI studies were reviewed and independently interpreted  Positive ROS: All other systems have been reviewed and were otherwise negative with the exception of those mentioned in the HPI and as  above.  Physical Exam: General: Alert, no acute distress Psychiatric: Patient is competent for consent with normal mood and affect Lymphatic: No axillary or cervical lymphadenopathy Cardiovascular: No pedal edema Respiratory: No cyanosis, no use of accessory musculature GI: No  organomegaly, abdomen is soft and non-tender    Images:  @ENCIMAGES @  Labs:  Lab Results  Component Value Date   HGBA1C 8.8 (H) 06/19/2023   HGBA1C 7.2 (H) 11/06/2020   HGBA1C 8.0 (H) 10/08/2017   ESRSEDRATE 106 (H) 06/19/2023   ESRSEDRATE 16 12/01/2017   CRP 4.5 (H) 06/19/2023   REPTSTATUS 03/11/2021 FINAL 03/10/2021   CULT  03/10/2021    NO GROWTH Performed at [facility], [address], [place], [place] [address]    LABORGA KLEBSIELLA OXYTOCA (A) 03/08/2021    Lab Results  Component Value Date   ALBUMIN 2.9 (L) 06/16/2021   ALBUMIN 3.4 (L) 06/14/2021   ALBUMIN 4.1 03/21/2019        Latest Ref Rng & Units 06/20/2023    7:41 AM 06/19/2023    1:19 PM 01/02/2022   10:18 AM  CBC EXTENDED  WBC 4.0 - 10.5 K/uL 10.3  11.1  6.7   RBC 4.22 - 5.81 MIL/uL 3.39  3.24  3.52   Hemoglobin 13.0 - 17.0 g/dL 9.0  9.1  19.1   HCT 47.8 - 52.0 % 28.8  28.4  32.0   Platelets 150 - 400 K/uL 256  250  177   NEUT# 1.7 - 7.7 K/uL  8.7    Lymph# 0.7 - 4.0 K/uL  1.4      Neurologic: Patient does not have protective sensation bilateral lower extremities.   MUSCULOSKELETAL:   Skin: Examination there is dry gangrene of the left great toe.  The metatarsal was exposed.  Patient does not have a palpable dorsalis pedis pulse.  Review of his recent ABIs shows monophasic noncompressible flow.  Review of the radiographs shows osteomyelitis of the tuft of the great toe with severe calcifications of the arteries in the foot out to the toes.  Most recent hemoglobin A1c 8.8.  Assessment: Assessment: Uncontrolled diabetes with severe peripheral vascular disease with dry gangrene of the left great toe with  exposed metatarsal and osteomyelitis.  Plan: Patient will need endovascular evaluation to determine if he has foot salvage intervention options.  Patient is not a candidate for amputation the great toe due to the severe diminished circulation to the left lower extremity.  Patient has been established with Dr. Jacinto Halim and would consult him for further endovascular evaluation.  Thank you for the consult and the opportunity to see Mr. Michelle Bova, MD [facility] [phone] 9:15 AM

## 2023-06-21 DIAGNOSIS — I96 Gangrene, not elsewhere classified: Secondary | ICD-10-CM | POA: Diagnosis not present

## 2023-06-21 LAB — CBC
HCT: 27.3 % — ABNORMAL LOW (ref 39.0–52.0)
Hemoglobin: 9 g/dL — ABNORMAL LOW (ref 13.0–17.0)
MCH: 27.5 pg (ref 26.0–34.0)
MCHC: 33 g/dL (ref 30.0–36.0)
MCV: 83.5 fL (ref 80.0–100.0)
Platelets: 255 10*3/uL (ref 150–400)
RBC: 3.27 MIL/uL — ABNORMAL LOW (ref 4.22–5.81)
RDW: 13.9 % (ref 11.5–15.5)
WBC: 11.3 10*3/uL — ABNORMAL HIGH (ref 4.0–10.5)
nRBC: 0 % (ref 0.0–0.2)

## 2023-06-21 LAB — BASIC METABOLIC PANEL
Anion gap: 7 (ref 5–15)
BUN: 32 mg/dL — ABNORMAL HIGH (ref 8–23)
CO2: 21 mmol/L — ABNORMAL LOW (ref 22–32)
Calcium: 8.9 mg/dL (ref 8.9–10.3)
Chloride: 108 mmol/L (ref 98–111)
Creatinine, Ser: 1.59 mg/dL — ABNORMAL HIGH (ref 0.61–1.24)
GFR, Estimated: 41 mL/min — ABNORMAL LOW (ref >60)
Glucose, Bld: 173 mg/dL — ABNORMAL HIGH (ref 70–99)
Potassium: 4 mmol/L (ref 3.5–5.1)
Sodium: 136 mmol/L (ref 135–145)

## 2023-06-21 LAB — GLUCOSE, CAPILLARY
Glucose-Capillary: 202 mg/dL — ABNORMAL HIGH (ref 70–99)
Glucose-Capillary: 227 mg/dL — ABNORMAL HIGH (ref 70–99)
Glucose-Capillary: 212 mg/dL — ABNORMAL HIGH (ref 70–99)
Glucose-Capillary: 206 mg/dL — ABNORMAL HIGH (ref 70–99)

## 2023-06-21 MED ORDER — SODIUM CHLORIDE 0.9 % IV SOLN
250.0000 mg | Freq: Every day | INTRAVENOUS | Status: AC
  Administered 2023-06-21 – 2023-06-23 (×3): 250 mg via INTRAVENOUS
  Filled 2023-06-21 (×3): qty 20

## 2023-06-21 MED ORDER — GABAPENTIN 100 MG PO CAPS
200.0000 mg | ORAL_CAPSULE | Freq: Three times a day (TID) | ORAL | Status: DC
  Administered 2023-06-21 – 2023-07-01 (×27): 200 mg via ORAL
  Filled 2023-06-21 (×27): qty 2

## 2023-06-21 NOTE — Progress Notes (Signed)
PROGRESS NOTE  Justin [name]  IDP:[id number] DOB: [birth year] DOA: 06/19/2023 PCP: Estevan Oaks, NP   Brief Narrative: Patient is a 89 year old male with history of diabetes type 2, GERD, hypertension, hyperlipidemia, coronary disease status post CABG, peripheral artery disease, history of prostate cancer, CKD stage IIIb who presented to the emergency room with complaint of worsening ulcer on his left great toe.  Over the past week, the patient observed drainage of pus and blood when the dressing was being changed, had foul smell.  No history of fever or chills.  On presentation he was hypertensive, afebrile.  Lab work showed creatinine of 1.76, baseline creatinine of 1.4-1.5.  Left foot x-ray showed increased subcutaneous air within the distal aspect of the great toe suspicious for early osteomyelitis.  Vascular surgery, orthopedics consulted.  Started on broad-spectrum antibiotics  Assessment & Plan:  Principal Problem:   Gangrene of toe of left foot (HCC) Active Problems:   Claudication in peripheral vascular disease (HCC)   Normocytic anemia   CAD s/p CABG in 2009   CKD stage 3 due to type 2 diabetes mellitus (HCC)   Diabetes mellitus without complication (HCC)   Asymptomatic bilateral carotid artery stenosis   Primary hypertension   GERD (gastroesophageal reflux disease)   HLD (hyperlipidemia)   Osteomyelitis of great toe of left foot (HCC)   Gangrene of left lower extremity: Patient presented with  complaint of worsening ulcer on his left great toe.  Over the past week, the patient observed drainage of pus and blood when the dressing was being changed, had foul smell.  X-rays suggestive of  of osteomyelitis.  Vascular surgery, orthopedics consulted.  Continue broad-spectrum antibiotics.  Might need amputation.  Orthopedics requesting for endovascular evaluation to determine if he has foot salvage options.  Vascular surgery has already notified Dr. Jacinto Halim.Sent message to Dr Jacinto Halim  today as well  Peripheral vascular disease: Follows with Dr Jacinto Halim, has been notified for consult  Normocytic anemia: Likely from chronic medical conditions.  Hemoglobin stable.  Iron of 26.  Given IV iron  Coronary artery disease: Status post CABG, Anginal Symptoms.  On Lipitor, aspirin, Zetia, Zocor  CKD stage IIIb: Baseline creatinine 1.4-1.6.  Currently kidney function near  at baseline.  Follows with nephrology as an outpatient  Diabetes type 2: A1c of 8.8.  Monitor blood sugars.  Continue sliding scale  insulin.  Bilateral carotid artery stenosis: Follows with cardiology  Hypertension: Monitor blood pressure.  On labetalol, Lasix, spironolactone, hydrochlorothiazide  Hyperlipidemia: Continue zokor, Zetia  GERD: Continue PPI  Peripheral neuropathy: Complains of bilateral lower extremity discomfort.  Continue gabapentin, increased the dose to 200 mg 3 times a day        DVT prophylaxis:heparin injection 5,000 Units Start: 06/19/23 2200     Code Status: Full Code  Family Communication: Called and discussed with daughter Eber Jones on phone on 9/21  Patient status:Inpatient  Patient is from :Home  Anticipated discharge WE:RXVQ  Estimated DC date: After determination of foot intervention   Consultants: vascular surgery  Procedures:None yet  Antimicrobials:  Anti-infectives (From admission, onward)    Start     Dose/Rate Route Frequency Ordered Stop   06/21/23 1000  vancomycin (VANCOREADY) IVPB 750 mg/150 mL        750 mg 150 mL/hr over 60 Minutes Intravenous Every 24 hours 06/20/23 0907     06/20/23 1000  vancomycin (VANCOREADY) IVPB 500 mg/100 mL        500 mg 100 mL/hr over 60  Minutes Intravenous  Once 06/20/23 0907 06/20/23 1118   06/19/23 2000  metroNIDAZOLE (FLAGYL) IVPB 500 mg        500 mg 100 mL/hr over 60 Minutes Intravenous Every 12 hours 06/19/23 1805     06/19/23 1930  cefTRIAXone (ROCEPHIN) 2 g in sodium chloride 0.9 % 100 mL IVPB        2 g 200  mL/hr over 30 Minutes Intravenous Every 24 hours 06/19/23 1839     06/19/23 1900  cefTRIAXone (ROCEPHIN) 1 g in sodium chloride 0.9 % 100 mL IVPB  Status:  Discontinued        1 g 200 mL/hr over 30 Minutes Intravenous Every 24 hours 06/19/23 1805 06/19/23 1832   06/19/23 1630  vancomycin (VANCOCIN) IVPB 1000 mg/200 mL premix        1,000 mg 200 mL/hr over 60 Minutes Intravenous  Once 06/19/23 1615 06/19/23 1800       Subjective: Patient seen and examined at bedside today.  Appears comfortable.  Eating his breakfast.  Does not complain of worsening pain on the left toe.  Has some neuropathy symptoms of bilateral lower extremities.  Objective: Vitals:   06/20/23 1720 06/20/23 2030 06/21/23 0349 06/21/23 0733  BP: (!) 175/60 (!) 158/64 135/68 (!) 151/81  Pulse:  80 85 82  Resp:    18  Temp:  98.9 F (37.2 C) 98.9 F (37.2 C)   TempSrc:      SpO2:  100% 98% 99%  Weight:      Height:       No intake or output data in the 24 hours ending 06/21/23 1054  Filed Weights   06/19/23 1315  Weight: 71.2 kg    Examination:  General exam: Overall comfortable, not in distress, pleasant elderly male HEENT: PERRL Respiratory system:  no wheezes or crackles  Cardiovascular system: S1 & S2 heard, RRR.  Gastrointestinal system: Abdomen is nondistended, soft and nontender. Central nervous system: Alert and oriented Extremities: No edema, no clubbing ,no cyanosis, black left toe Skin: No rashes, no ulcers,no icterus     Data Reviewed: I have personally reviewed following labs and imaging studies  CBC: Recent Labs  Lab 06/19/23 1319 06/20/23 0741 06/21/23 0854  WBC 11.1* 10.3 11.3*  NEUTROABS 8.7*  --   --   HGB 9.1* 9.0* 9.0*  HCT 28.4* 28.8* 27.3*  MCV 87.7 85.0 83.5  PLT 250 256 255   Basic Metabolic Panel: Recent Labs  Lab 06/19/23 1319 06/20/23 0741 06/21/23 0854  NA 135 135 136  K 4.7 4.7 4.0  CL 106 107 108  CO2 19* 17* 21*  GLUCOSE 233* 199* 173*  BUN 39* 34*  32*  CREATININE 1.76* 1.56* 1.59*  CALCIUM 9.0 9.0 8.9     Recent Results (from the past 240 hour(s))  Culture, blood (Routine X 2) w Reflex to ID Panel     Status: None (Preliminary result)   Collection Time: 06/20/23  8:55 AM   Specimen: BLOOD RIGHT ARM  Result Value Ref Range Status   Specimen Description BLOOD RIGHT ARM  Final   Special Requests   Final    BOTTLES DRAWN AEROBIC AND ANAEROBIC Blood Culture adequate volume   Culture   Final    NO GROWTH < 24 HOURS Performed at [facility], [address] [place], [place] [address]    Report Status PENDING  Incomplete  Culture, blood (Routine X 2) w Reflex to ID Panel  Status: None (Preliminary result)   Collection Time: 06/20/23  8:57 AM   Specimen: BLOOD RIGHT ARM  Result Value Ref Range Status   Specimen Description BLOOD RIGHT ARM  Final   Special Requests   Final    BOTTLES DRAWN AEROBIC AND ANAEROBIC Blood Culture adequate volume   Culture   Final    NO GROWTH < 24 HOURS Performed at [facility], [address], [place], [place] [address]    Report Status PENDING  Incomplete     Radiology Studies: DG Foot Complete Left  Result Date: 06/19/2023 CLINICAL DATA:  Left great toe osteomyelitis. Gangrene of left great toe for 6 months. EXAM: LEFT FOOT - COMPLETE 3+ VIEW COMPARISON:  Left foot radiographs 05/22/2023 FINDINGS: There is again diffuse decreased bone mineralization. There is increased lucency overlying the distal great toe soft tissues indicating subcutaneous air around the distal phalanx. There is thinning of the distal lateral aspect of the great toe on frontal view with possible exposure of bone to air. There is attenuation of the distal medial aspect of the great toe soft tissues on frontal and oblique view. Overlap of the toes lateral view limits evaluation of the great toe soft tissues. There is a increased lucency which may reflect minimal early cortical thinning of the mid to distal aspect of  the great toe distal phalanx. Tiny plantar calcaneal heel spur. High-grade atherosclerotic calcifications. IMPRESSION: 1. Increased subcutaneous air within the distal aspect of the great toe. 2. There is thinning of the distal lateral aspect of the great toe on frontal view with possible exposure of bone to air. 3. Increased lucency which may reflect minimal early cortical thinning of the mid to distal aspect of the great toe distal phalanx. This may represent early osteomyelitis. Electronically Signed   By: Neita Garnet M.D.   On: 06/19/2023 14:16    Scheduled Meds:  aspirin  81 mg Oral Daily   DULoxetine  60 mg Oral Daily   ezetimibe  10 mg Oral QPM   furosemide  40 mg Oral Daily   gabapentin  100 mg Oral TID   heparin injection (subcutaneous)  5,000 Units Subcutaneous Q8H   spironolactone  12.5 mg Oral Daily   And   hydrochlorothiazide  12.5 mg Oral Daily   influenza vaccine adjuvanted  0.5 mL Intramuscular Tomorrow-1000   insulin aspart  0-9 Units Subcutaneous TID WC   insulin glargine-yfgn  5 Units Subcutaneous Daily   labetalol  100 mg Oral BID   pantoprazole  80 mg Oral Daily   pneumococcal 20-valent conjugate vaccine  0.5 mL Intramuscular Tomorrow-1000   polyethylene glycol  17 g Oral Daily   ropinirole  5 mg Oral QHS   senna  1 tablet Oral BID   simvastatin  20 mg Oral QHS   tamsulosin  0.4 mg Oral QPC supper   Continuous Infusions:  cefTRIAXone (ROCEPHIN)  IV 2 g (06/20/23 1909)   metronidazole 500 mg (06/21/23 5784)   vancomycin       LOS: 2 days   Burnadette Pop, MD [facility] P9/22/2024, 10:54 AM

## 2023-06-21 NOTE — Progress Notes (Signed)
Subjective  -   No acute events   Physical Exam:  Left great toe gangrene Palpable femoral pulses, nonpalpable pedal pulses       Assessment/Plan:    Left great toe gangrene with vascular insufficiency: I spoke with Dr. Jacinto Halim yesterday.  He is unavailable this week and has requested that [facility] perform angiography.  I discussed this with the patient and he is in agreement.  This will be through a right femoral approach with intervention on the left leg as indicated.  He does have renal insufficiency and so this will likely need to be done with a combination of CO2 and contrast.  N.p.o. after midnight.  Justin [name] 06/21/2023 11:02 AM --  Vitals:   06/21/23 0349 06/21/23 0733  BP: 135/68 (!) 151/81  Pulse: 85 82  Resp:  18  Temp: 98.9 F (37.2 C)   SpO2: 98% 99%   No intake or output data in the 24 hours ending 06/21/23 1102   Laboratory CBC    Component Value Date/Time   WBC 11.3 (H) 06/21/2023 0854   HGB 9.0 (L) 06/21/2023 0854   HGB 10.5 (L) 01/02/2022 1018   HCT 27.3 (L) 06/21/2023 0854   HCT 32.0 (L) 01/02/2022 1018   PLT 255 06/21/2023 0854   PLT 177 01/02/2022 1018    BMET    Component Value Date/Time   NA 136 06/21/2023 0854   NA 140 08/20/2022 0827   K 4.0 06/21/2023 0854   CL 108 06/21/2023 0854   CO2 21 (L) 06/21/2023 0854   GLUCOSE 173 (H) 06/21/2023 0854   BUN 32 (H) 06/21/2023 0854   BUN 31 (H) 08/20/2022 0827   CREATININE 1.59 (H) 06/21/2023 0854   CREATININE 1.63 (H) 09/30/2018 1023   CALCIUM 8.9 06/21/2023 0854   GFRNONAA 41 (L) 06/21/2023 0854   GFRNONAA 38 (L) 09/30/2018 1023   GFRAA 43 (L) 10/09/2020 1001   GFRAA 44 (L) 09/30/2018 1023    COAG Lab Results  Component Value Date   INR 1.3 (H) 12/14/2018   No results found for: "PTT"  Antibiotics Anti-infectives (From admission, onward)    Start     Dose/Rate Route Frequency Ordered Stop   06/21/23 1000  vancomycin (VANCOREADY) IVPB 750 mg/150 mL        750 mg 150  mL/hr over 60 Minutes Intravenous Every 24 hours 06/20/23 0907     06/20/23 1000  vancomycin (VANCOREADY) IVPB 500 mg/100 mL        500 mg 100 mL/hr over 60 Minutes Intravenous  Once 06/20/23 0907 06/20/23 1118   06/19/23 2000  metroNIDAZOLE (FLAGYL) IVPB 500 mg        500 mg 100 mL/hr over 60 Minutes Intravenous Every 12 hours 06/19/23 1805     06/19/23 1930  cefTRIAXone (ROCEPHIN) 2 g in sodium chloride 0.9 % 100 mL IVPB        2 g 200 mL/hr over 30 Minutes Intravenous Every 24 hours 06/19/23 1839     06/19/23 1900  cefTRIAXone (ROCEPHIN) 1 g in sodium chloride 0.9 % 100 mL IVPB  Status:  Discontinued        1 g 200 mL/hr over 30 Minutes Intravenous Every 24 hours 06/19/23 1805 06/19/23 1832   06/19/23 1630  vancomycin (VANCOCIN) IVPB 1000 mg/200 mL premix        1,000 mg 200 mL/hr over 60 Minutes Intravenous  Once 06/19/23 1615 06/19/23 1800        V. Anner Crete  Justin [name], M.D., [facility] [place] Office: [phone] Pager:  [phone]

## 2023-06-22 ENCOUNTER — Encounter (HOSPITAL_COMMUNITY)
Admission: EM | Disposition: A | Discharge status: Skilled Nursing Facility | Payer: Self-pay | Source: Home / Self Care | Attending: Internal Medicine

## 2023-06-22 DIAGNOSIS — M869 Osteomyelitis, unspecified: Secondary | ICD-10-CM | POA: Diagnosis not present

## 2023-06-22 DIAGNOSIS — I96 Gangrene, not elsewhere classified: Secondary | ICD-10-CM | POA: Diagnosis not present

## 2023-06-22 DIAGNOSIS — E1152 Type 2 diabetes mellitus with diabetic peripheral angiopathy with gangrene: Secondary | ICD-10-CM | POA: Diagnosis not present

## 2023-06-22 HISTORY — PX: ABDOMINAL AORTOGRAM W/LOWER EXTREMITY: CATH118223

## 2023-06-22 LAB — GLUCOSE, CAPILLARY
Glucose-Capillary: 164 mg/dL — ABNORMAL HIGH (ref 70–99)
Glucose-Capillary: 139 mg/dL — ABNORMAL HIGH (ref 70–99)
Glucose-Capillary: 164 mg/dL — ABNORMAL HIGH (ref 70–99)
Glucose-Capillary: 159 mg/dL — ABNORMAL HIGH (ref 70–99)
Glucose-Capillary: 257 mg/dL — ABNORMAL HIGH (ref 70–99)

## 2023-06-22 LAB — BASIC METABOLIC PANEL
Anion gap: 14 (ref 5–15)
BUN: 33 mg/dL — ABNORMAL HIGH (ref 8–23)
CO2: 16 mmol/L — ABNORMAL LOW (ref 22–32)
Calcium: 9.1 mg/dL (ref 8.9–10.3)
Chloride: 108 mmol/L (ref 98–111)
Creatinine, Ser: 1.81 mg/dL — ABNORMAL HIGH (ref 0.61–1.24)
GFR, Estimated: 36 mL/min — ABNORMAL LOW (ref >60)
Glucose, Bld: 179 mg/dL — ABNORMAL HIGH (ref 70–99)
Potassium: 4.1 mmol/L (ref 3.5–5.1)
Sodium: 138 mmol/L (ref 135–145)

## 2023-06-22 SURGERY — ABDOMINAL AORTOGRAM W/LOWER EXTREMITY
Anesthesia: LOCAL

## 2023-06-22 MED ORDER — DIPHENHYDRAMINE HCL 50 MG/ML IJ SOLN
INTRAMUSCULAR | Status: AC
  Filled 2023-06-22: qty 1

## 2023-06-22 MED ORDER — HYDRALAZINE HCL 20 MG/ML IJ SOLN
INTRAMUSCULAR | Status: DC | PRN
  Administered 2023-06-22: 10 mg via INTRAVENOUS

## 2023-06-22 MED ORDER — INSULIN GLARGINE-YFGN 100 UNIT/ML ~~LOC~~ SOLN
10.0000 [IU] | Freq: Every day | SUBCUTANEOUS | Status: DC
  Administered 2023-06-22 – 2023-07-01 (×10): 10 [IU] via SUBCUTANEOUS
  Filled 2023-06-22 (×10): qty 0.1

## 2023-06-22 MED ORDER — HYDRALAZINE HCL 20 MG/ML IJ SOLN
INTRAMUSCULAR | Status: AC
  Filled 2023-06-22: qty 1

## 2023-06-22 MED ORDER — SODIUM CHLORIDE 0.9% FLUSH
3.0000 mL | Freq: Two times a day (BID) | INTRAVENOUS | Status: DC
  Administered 2023-06-22 – 2023-07-01 (×9): 3 mL via INTRAVENOUS

## 2023-06-22 MED ORDER — ACETAMINOPHEN 325 MG PO TABS: 650.0000 mg | ORAL_TABLET | ORAL | Status: DC | PRN

## 2023-06-22 MED ORDER — ONDANSETRON HCL 4 MG/2ML IJ SOLN: 4.0000 mg | Freq: Four times a day (QID) | INTRAMUSCULAR | Status: DC | PRN

## 2023-06-22 MED ORDER — VANCOMYCIN HCL IN DEXTROSE 1-5 GM/200ML-% IV SOLN
1000.0000 mg | INTRAVENOUS | Status: DC
  Administered 2023-06-23 – 2023-06-25 (×2): 1000 mg via INTRAVENOUS
  Filled 2023-06-22 (×2): qty 200

## 2023-06-22 MED ORDER — OXYCODONE HCL 5 MG PO TABS: 5.0000 mg | ORAL_TABLET | ORAL | Status: DC | PRN

## 2023-06-22 MED ORDER — LIDOCAINE HCL (PF) 1 % IJ SOLN
INTRAMUSCULAR | Status: AC
  Filled 2023-06-22: qty 30

## 2023-06-22 MED ORDER — LABETALOL HCL 5 MG/ML IV SOLN: 10.0000 mg | INTRAVENOUS | Status: DC | PRN

## 2023-06-22 MED ORDER — SODIUM CHLORIDE 0.9 % IV SOLN: INTRAVENOUS | Status: DC

## 2023-06-22 MED ORDER — SODIUM CHLORIDE 0.9% FLUSH: 3.0000 mL | INTRAVENOUS | Status: DC | PRN

## 2023-06-22 MED ORDER — LIDOCAINE HCL (PF) 1 % IJ SOLN
INTRAMUSCULAR | Status: DC | PRN
  Administered 2023-06-22: 15 mL

## 2023-06-22 MED ORDER — HYDRALAZINE HCL 20 MG/ML IJ SOLN: 5.0000 mg | Freq: Three times a day (TID) | INTRAMUSCULAR | Status: DC | PRN

## 2023-06-22 MED ORDER — SODIUM CHLORIDE 0.9 % IV SOLN: 250.0000 mL | INTRAVENOUS | Status: DC | PRN

## 2023-06-22 MED ORDER — IODIXANOL 320 MG/ML IV SOLN
INTRAVENOUS | Status: DC | PRN
  Administered 2023-06-22: 15 mL via INTRA_ARTERIAL

## 2023-06-22 MED ORDER — HEPARIN (PORCINE) IN NACL 1000-0.9 UT/500ML-% IV SOLN
INTRAVENOUS | Status: DC | PRN
  Administered 2023-06-22 (×2): 500 mL

## 2023-06-22 MED ORDER — HYDRALAZINE HCL 20 MG/ML IJ SOLN: 5.0000 mg | INTRAMUSCULAR | Status: DC | PRN

## 2023-06-22 MED ORDER — DIPHENHYDRAMINE HCL 50 MG/ML IJ SOLN
INTRAMUSCULAR | Status: DC | PRN
  Administered 2023-06-22: 25 mg via INTRAVENOUS

## 2023-06-22 MED ORDER — SODIUM CHLORIDE 0.9 % IV SOLN: INTRAVENOUS | Status: AC

## 2023-06-22 MED ORDER — HYDROCODONE-ACETAMINOPHEN 5-325 MG PO TABS
ORAL_TABLET | ORAL | Status: AC
  Administered 2023-06-22: 1 via ORAL
  Filled 2023-06-22: qty 1

## 2023-06-22 SURGICAL SUPPLY — 12 items
CATH OMNI FLUSH 5F 65CM (CATHETERS) ×1
CATH SOFT-VU ST 4F 90CM (CATHETERS) ×1
CLOSURE MYNX CONTROL 5F (Vascular Products) ×1 IMPLANT
COVER DOME SNAP 22 D (MISCELLANEOUS) ×1
KIT ANGIASSIST CO2 SYSTEM (KITS) ×1
KIT MICROPUNCTURE NIT STIFF (SHEATH) ×1
PROTECTION STATION PRESSURIZED (MISCELLANEOUS) ×1
SET ATX-X65L (MISCELLANEOUS) ×1
SHEATH PINNACLE 5F 10CM (SHEATH) ×1
SHEATH PROBE COVER 6X72 (BAG) ×1
TRAY PV CATH (CUSTOM PROCEDURE TRAY) ×1
WIRE BENTSON .035X145CM (WIRE) ×1

## 2023-06-22 NOTE — Op Note (Signed)
Patient name: Justin [name] MRN: [id number] DOB: 05-13-1933 Sex: male  06/22/2023 Pre-operative Diagnosis: Atherosclerosis native arteries with left great toe gangrene Post-operative diagnosis:  Same Surgeon:  Luanna Salk. Randie Heinz, MD Procedure Performed: 1.  Percutaneous ultrasound guided access and Mynx device closure right common femoral artery 2.  Catheter aorta with aortogram and bilateral lower extremity angiography 3.  Selection with catheter of left popliteal artery  Indications: 89 year old male with history of peripheral arterial disease now with left great toe gangrene likely will require amputation with monophasic ABIs indicated for angiography.  Findings: The aorta and iliac segments are free of flow-limiting stenosis although they are calcified.  Bilateral common femoral arteries and proximal SFAs are calcified.  On the left side the SFA is heavily calcified throughout without flow-limiting stenosis in the popliteal artery also similarly without stenosis.  Posterior tibial artery has a high takeoff there is no frank tibioperoneal trunk but the posterior tibial artery occludes after the takeoff and is quite diminutive.  The anterior tibial artery is the dominant runoff to the level of the ankle where then has stenosis and does flow onto the foot although there is no distal flow.  I elected for no intervention given that this appears to be small vessel and outflow issue of the left foot.   Procedure:  The patient was identified in the holding area and taken to room 8.  The patient was then placed supine on the table and prepped and draped in the usual sterile fashion.  A time out was called.  Ultrasound was used to evaluate the right common femoral artery which was calcified although there was no luminal stenosis.  The area was anesthetized with 1% lidocaine and cannulated with a micropuncture needle followed by wire and a sheath.  An ultrasound image was saved the permanent record.  We  placed a Bentson wire followed by 5 [place] sheath and Omni catheter to the level of L1 where CO2 aortogram was performed and the catheter was pulled to the bifurcation bilateral lower extremity angiography was performed.  We then crossed the bifurcation with Bentson wire and Omni catheter placed this in the left common femoral artery and perform left lower extremity angiography.  We then placed a straight catheter over a wire all the way to the popliteal artery at the level of the knee and perform contrast angiography below the knee.  With the above findings we elected no intervention the catheter was removed and a minx device was deployed.  He tolerated the procedure without any complication.  Contrast: 15 cc    Viann Nielson C. Randie Heinz, MD [facility] [place] Office: [phone] Pager: [phone]

## 2023-06-22 NOTE — Progress Notes (Signed)
Pharmacy Antibiotic Note  Justin [name] is a 89 y.o. male admitted on 06/19/2023 with  osteomyelitis .  Pharmacy has been consulted for vancomycin dosing. Patient presented with chronic left great toe ulcer that has been present for 1.5 years that has recently worsened with increased odor, pus and bloody drainage. X-ray on 9/20 revealed possible early osteomyelitis. Also, on ceftriaxone 2 grams Q24h and metronidazole 500 mg Q12h.    SCr has bumped up to 1.81 today  Plan: Empirically adjust Vancomycin to 1000 mg IV Q36 hours (eAUC 494, Scr 1.81) Continue to monitor renal function Follow-up surgical plans and ability to narrow antibiotics as able  Height: 5\' 7"  (170.2 cm) Weight: 71.2 kg (157 lb) IBW/kg (Calculated) : 66.1  Temp (24hrs), Avg:98.5 F (36.9 C), Min:98.3 F (36.8 C), Max:98.8 F (37.1 C)  Recent Labs  Lab 06/19/23 1319 06/19/23 1331 06/20/23 0741 06/21/23 0854 06/22/23 0922  WBC 11.1*  --  10.3 11.3*  --   CREATININE 1.76*  --  1.56* 1.59* 1.81*  LATICACIDVEN  --  1.6  --   --   --     Estimated Creatinine Clearance: 26.4 mL/min (A) (by C-G formula based on SCr of 1.81 mg/dL (H)).    Allergies  Allergen Reactions   Contrast Media [Iodinated Contrast Media] Itching    PT STATES ALLERGY TO "CT DYE".   Pletal [Cilostazol] Diarrhea   Amlodipine Swelling    Leg swelling   Dye Fdc Red [Red Dye #40 (Allura Red)] Swelling    CAT scan dye   Ibuprofen Other (See Comments)    Upset stomach     Antimicrobials this admission: 9/20 Ceftriaxone >>  9/20 Metronidazole >> 9/20 vanc >>   Microbiology results: 9/21 BCx: ng x 2d   Thank you for allowing pharmacy to be a part of this patient's care.   Signe Colt, PharmD 06/22/2023 11:55 AM  **Pharmacist phone directory can be found on [email or web address] listed under [facility]**

## 2023-06-22 NOTE — Care Management Important Message (Signed)
Important Message  Patient Details  Name: Justin [name] MRN: [id number] Date of Birth: 1933/07/12   Medicare Important Message Given:  Yes     Daleiza Bacchi Stefan Church 06/22/2023, 3:34 PM

## 2023-06-22 NOTE — Progress Notes (Signed)
  Progress Note    06/22/2023 12:31 PM * No surgery date entered *  Subjective:  no new issues  Vitals:   06/22/23 0308 06/22/23 0754  BP: (!) 118/54 (!) 127/49  Pulse: 75 76  Resp:  17  Temp: 98.7 F (37.1 C) 98.3 F (36.8 C)  SpO2: 100% 100%    Physical Exam: Left great toe gangrene Palpable left common femoral pulse  CBC    Component Value Date/Time   WBC 11.3 (H) 06/21/2023 0854   RBC 3.27 (L) 06/21/2023 0854   HGB 9.0 (L) 06/21/2023 0854   HGB 10.5 (L) 01/02/2022 1018   HCT 27.3 (L) 06/21/2023 0854   HCT 32.0 (L) 01/02/2022 1018   PLT 255 06/21/2023 0854   PLT 177 01/02/2022 1018   MCV 83.5 06/21/2023 0854   MCV 91 01/02/2022 1018   MCH 27.5 06/21/2023 0854   MCHC 33.0 06/21/2023 0854   RDW 13.9 06/21/2023 0854   RDW 12.2 01/02/2022 1018   LYMPHSABS 1.4 06/19/2023 1319   MONOABS 0.7 06/19/2023 1319   EOSABS 0.2 06/19/2023 1319   BASOSABS 0.1 06/19/2023 1319    BMET    Component Value Date/Time   NA 138 06/22/2023 0922   NA 140 08/20/2022 0827   K 4.1 06/22/2023 0922   CL 108 06/22/2023 0922   CO2 16 (L) 06/22/2023 0922   GLUCOSE 179 (H) 06/22/2023 0922   BUN 33 (H) 06/22/2023 0922   BUN 31 (H) 08/20/2022 0827   CREATININE 1.81 (H) 06/22/2023 0922   CREATININE 1.63 (H) 09/30/2018 1023   CALCIUM 9.1 06/22/2023 0922   GFRNONAA 36 (L) 06/22/2023 0922   GFRNONAA 38 (L) 09/30/2018 1023   GFRAA 43 (L) 10/09/2020 1001   GFRAA 44 (L) 09/30/2018 1023    INR    Component Value Date/Time   INR 1.3 (H) 12/14/2018 2228     Intake/Output Summary (Last 24 hours) at 06/22/2023 1231 Last data filed at 06/22/2023 0400 Gross per 24 hour  Intake 470 ml  Output --  Net 470 ml     Assessment:  89 y.o. male is here with left great toe gangrene  Plan: Angiogram today  Danai Gotto C. Randie Heinz, MD [facility] [place] Office: [phone] Pager: [phone]  06/22/2023 12:31 PM

## 2023-06-22 NOTE — Progress Notes (Signed)
PROGRESS NOTE  Justin [name]  ION:[id number] DOB: 09/02/1933 DOA: 06/19/2023 PCP: Estevan Oaks, NP   Brief Narrative: Patient is a 89 year old male with history of diabetes type 2, GERD, hypertension, hyperlipidemia, coronary disease status post CABG, peripheral artery disease, history of prostate cancer, CKD stage IIIb who presented to the emergency room with complaint of worsening ulcer on his left great toe.  Over the past week, the patient observed drainage of pus and blood when the dressing was being changed, had foul smell.  No history of fever or chills.  On presentation he was hypertensive, afebrile.  Lab work showed creatinine of 1.76, baseline creatinine of 1.4-1.5.  Left foot x-ray showed increased subcutaneous air within the distal aspect of the great toe suspicious for early osteomyelitis.  Vascular surgery, orthopedics consulted.  Plan for angiography today  Assessment & Plan:  Principal Problem:   Gangrene of toe of left foot (HCC) Active Problems:   Claudication in peripheral vascular disease (HCC)   Normocytic anemia   CAD s/p CABG in 2009   CKD stage 3 due to type 2 diabetes mellitus (HCC)   Diabetes mellitus without complication (HCC)   Asymptomatic bilateral carotid artery stenosis   Primary hypertension   GERD (gastroesophageal reflux disease)   HLD (hyperlipidemia)   Osteomyelitis of great toe of left foot (HCC)   Gangrene of left lower extremity: Patient presented with  complaint of worsening ulcer on his left great toe.  Over the past week, the patient observed drainage of pus and blood when the dressing was being changed, had foul smell.  X-rays suggestive of  of osteomyelitis.  Vascular surgery, orthopedics consulted.  Continue broad-spectrum antibiotics.  Might need amputation.  Orthopedics requesting for endovascular evaluation to determine if he has foot salvage options.  Vascular surgery planning for angiography  Peripheral vascular disease: Follows with  Dr Jacinto Halim, vascular surgery following here  Normocytic anemia: Likely from chronic medical conditions.  Hemoglobin stable.  Iron of 26.  Given IV iron  Coronary artery disease: Status post CABG, Anginal Symptoms.  On Lipitor, aspirin, Zetia, Zocor  CKD stage IIIb: Baseline creatinine 1.4-1.6.  Currently kidney function near  at baseline.  Follows with nephrology as an outpatient  Diabetes type 2: A1c of 8.8.  Monitor blood sugars.  Continue sliding scale  insulin.  Bilateral carotid artery stenosis: Follows with cardiology  Hypertension: Monitor blood pressure.  On labetalol, Lasix, spironolactone, hydrochlorothiazide  Hyperlipidemia: Continue zokor, Zetia  GERD: Continue PPI  Peripheral neuropathy: Complains of bilateral lower extremity discomfort.  Continue gabapentin, increased the dose to 200 mg 3 times a day        DVT prophylaxis:heparin injection 5,000 Units Start: 06/19/23 2200     Code Status: Full Code  Family Communication: Called and discussed with daughter Justin [name] on phone on 9/21. Discussed with grandson at bedside on 9/23  Patient status:Inpatient  Patient is from :Home  Anticipated discharge KG:MWNU  Estimated DC date: After determination of foot intervention   Consultants: vascular surgery  Procedures:None yet  Antimicrobials:  Anti-infectives (From admission, onward)    Start     Dose/Rate Route Frequency Ordered Stop   06/21/23 1000  vancomycin (VANCOREADY) IVPB 750 mg/150 mL        750 mg 150 mL/hr over 60 Minutes Intravenous Every 24 hours 06/20/23 0907     06/20/23 1000  vancomycin (VANCOREADY) IVPB 500 mg/100 mL        500 mg 100 mL/hr over 60 Minutes Intravenous  Once 06/20/23 0907 06/20/23 1118   06/19/23 2000  metroNIDAZOLE (FLAGYL) IVPB 500 mg        500 mg 100 mL/hr over 60 Minutes Intravenous Every 12 hours 06/19/23 1805     06/19/23 1930  cefTRIAXone (ROCEPHIN) 2 g in sodium chloride 0.9 % 100 mL IVPB        2 g 200 mL/hr over  30 Minutes Intravenous Every 24 hours 06/19/23 1839     06/19/23 1900  cefTRIAXone (ROCEPHIN) 1 g in sodium chloride 0.9 % 100 mL IVPB  Status:  Discontinued        1 g 200 mL/hr over 30 Minutes Intravenous Every 24 hours 06/19/23 1805 06/19/23 1832   06/19/23 1630  vancomycin (VANCOCIN) IVPB 1000 mg/200 mL premix        1,000 mg 200 mL/hr over 60 Minutes Intravenous  Once 06/19/23 1615 06/19/23 1800       Subjective: Patient seen and examined the bedside today.  Hemodynamically stable.  Lying in bed.  Comfortable.  Grandson at bedside.  Does not complain of any pain today. Objective: Vitals:   06/21/23 1926 06/21/23 2007 06/22/23 0308 06/22/23 0754  BP: (!) 144/64 (!) 166/73 (!) 118/54 (!) 127/49  Pulse: 73 72 75 76  Resp: 18   17  Temp: 98.6 F (37 C) 98.3 F (36.8 C) 98.7 F (37.1 C) 98.3 F (36.8 C)  TempSrc:  Oral Oral Oral  SpO2: 100% 100% 100% 100%  Weight:      Height:        Intake/Output Summary (Last 24 hours) at 06/22/2023 1047 Last data filed at 06/22/2023 0400 Gross per 24 hour  Intake 470 ml  Output --  Net 470 ml    Filed Weights   06/19/23 1315  Weight: 71.2 kg    Examination:   General exam: Overall comfortable, not in distress HEENT: PERRL Respiratory system:  no wheezes or crackles  Cardiovascular system: S1 & S2 heard, RRR.  Gastrointestinal system: Abdomen is nondistended, soft and nontender. Central nervous system: Alert and oriented Extremities: No edema, no clubbing ,no cyanosis, black left toe Skin: No rashes, no ulcers,no icterus     Data Reviewed: I have personally reviewed following labs and imaging studies  CBC: Recent Labs  Lab 06/19/23 1319 06/20/23 0741 06/21/23 0854  WBC 11.1* 10.3 11.3*  NEUTROABS 8.7*  --   --   HGB 9.1* 9.0* 9.0*  HCT 28.4* 28.8* 27.3*  MCV 87.7 85.0 83.5  PLT 250 256 255   Basic Metabolic Panel: Recent Labs  Lab 06/19/23 1319 06/20/23 0741 06/21/23 0854  NA 135 135 136  K 4.7 4.7 4.0   CL 106 107 108  CO2 19* 17* 21*  GLUCOSE 233* 199* 173*  BUN 39* 34* 32*  CREATININE 1.76* 1.56* 1.59*  CALCIUM 9.0 9.0 8.9     Recent Results (from the past 240 hour(s))  Culture, blood (Routine X 2) w Reflex to ID Panel     Status: None (Preliminary result)   Collection Time: 06/20/23  8:55 AM   Specimen: BLOOD RIGHT ARM  Result Value Ref Range Status   Specimen Description BLOOD RIGHT ARM  Final   Special Requests   Final    BOTTLES DRAWN AEROBIC AND ANAEROBIC Blood Culture adequate volume   Culture   Final    NO GROWTH 2 DAYS Performed at [facility], [address], [place], [place] [address]    Report Status PENDING  Incomplete  Culture, blood (Routine X 2) w Reflex to ID Panel     Status: None (Preliminary result)   Collection Time: 06/20/23  8:57 AM   Specimen: BLOOD RIGHT ARM  Result Value Ref Range Status   Specimen Description BLOOD RIGHT ARM  Final   Special Requests   Final    BOTTLES DRAWN AEROBIC AND ANAEROBIC Blood Culture adequate volume   Culture   Final    NO GROWTH 2 DAYS Performed at [facility], [address], [place], [place] [address]    Report Status PENDING  Incomplete     Radiology Studies: No results found.  Scheduled Meds:  aspirin  81 mg Oral Daily   DULoxetine  60 mg Oral Daily   ezetimibe  10 mg Oral QPM   furosemide  40 mg Oral Daily   gabapentin  200 mg Oral TID   heparin injection (subcutaneous)  5,000 Units Subcutaneous Q8H   spironolactone  12.5 mg Oral Daily   And   hydrochlorothiazide  12.5 mg Oral Daily   influenza vaccine adjuvanted  0.5 mL Intramuscular Tomorrow-1000   insulin aspart  0-9 Units Subcutaneous TID WC   insulin glargine-yfgn  10 Units Subcutaneous Daily   labetalol  100 mg Oral BID   pantoprazole  80 mg Oral Daily   pneumococcal 20-valent conjugate vaccine  0.5 mL Intramuscular Tomorrow-1000   polyethylene glycol  17 g Oral Daily   ropinirole  5 mg Oral QHS   senna  1 tablet Oral BID    simvastatin  20 mg Oral QHS   tamsulosin  0.4 mg Oral QPC supper   Continuous Infusions:  sodium chloride 100 mL/hr at 06/22/23 1007   cefTRIAXone (ROCEPHIN)  IV 2 g (06/21/23 1959)   ferric gluconate (FERRLECIT) IVPB 250 mg (06/21/23 1658)   metronidazole 500 mg (06/22/23 1009)   vancomycin 750 mg (06/21/23 1109)     LOS: 3 days   Burnadette Pop, MD [facility] P9/23/2024, 10:47 AM

## 2023-06-23 ENCOUNTER — Encounter (HOSPITAL_COMMUNITY): Payer: Self-pay | Admitting: Vascular Surgery

## 2023-06-23 DIAGNOSIS — Z8546 Personal history of malignant neoplasm of prostate: Secondary | ICD-10-CM | POA: Diagnosis not present

## 2023-06-23 DIAGNOSIS — E11621 Type 2 diabetes mellitus with foot ulcer: Secondary | ICD-10-CM | POA: Diagnosis present

## 2023-06-23 DIAGNOSIS — M869 Osteomyelitis, unspecified: Secondary | ICD-10-CM | POA: Diagnosis present

## 2023-06-23 DIAGNOSIS — E113293 Type 2 diabetes mellitus with mild nonproliferative diabetic retinopathy without macular edema, bilateral: Secondary | ICD-10-CM | POA: Diagnosis present

## 2023-06-23 DIAGNOSIS — E119 Type 2 diabetes mellitus without complications: Secondary | ICD-10-CM | POA: Diagnosis not present

## 2023-06-23 DIAGNOSIS — Z794 Long term (current) use of insulin: Secondary | ICD-10-CM | POA: Diagnosis not present

## 2023-06-23 DIAGNOSIS — I6523 Occlusion and stenosis of bilateral carotid arteries: Secondary | ICD-10-CM | POA: Diagnosis present

## 2023-06-23 DIAGNOSIS — S88112S Complete traumatic amputation at level between knee and ankle, left lower leg, sequela: Secondary | ICD-10-CM | POA: Diagnosis not present

## 2023-06-23 DIAGNOSIS — E1165 Type 2 diabetes mellitus with hyperglycemia: Secondary | ICD-10-CM | POA: Diagnosis present

## 2023-06-23 DIAGNOSIS — N183 Chronic kidney disease, stage 3 unspecified: Secondary | ICD-10-CM | POA: Diagnosis not present

## 2023-06-23 DIAGNOSIS — N1832 Chronic kidney disease, stage 3b: Secondary | ICD-10-CM | POA: Diagnosis present

## 2023-06-23 DIAGNOSIS — I10 Essential (primary) hypertension: Secondary | ICD-10-CM | POA: Diagnosis not present

## 2023-06-23 DIAGNOSIS — Z888 Allergy status to other drugs, medicaments and biological substances status: Secondary | ICD-10-CM | POA: Diagnosis not present

## 2023-06-23 DIAGNOSIS — E1169 Type 2 diabetes mellitus with other specified complication: Secondary | ICD-10-CM | POA: Diagnosis present

## 2023-06-23 DIAGNOSIS — E1140 Type 2 diabetes mellitus with diabetic neuropathy, unspecified: Secondary | ICD-10-CM | POA: Diagnosis present

## 2023-06-23 DIAGNOSIS — D638 Anemia in other chronic diseases classified elsewhere: Secondary | ICD-10-CM | POA: Diagnosis present

## 2023-06-23 DIAGNOSIS — D649 Anemia, unspecified: Secondary | ICD-10-CM | POA: Diagnosis not present

## 2023-06-23 DIAGNOSIS — E8721 Acute metabolic acidosis: Secondary | ICD-10-CM | POA: Diagnosis present

## 2023-06-23 DIAGNOSIS — K219 Gastro-esophageal reflux disease without esophagitis: Secondary | ICD-10-CM | POA: Diagnosis present

## 2023-06-23 DIAGNOSIS — Z87891 Personal history of nicotine dependence: Secondary | ICD-10-CM | POA: Diagnosis not present

## 2023-06-23 DIAGNOSIS — I96 Gangrene, not elsewhere classified: Secondary | ICD-10-CM | POA: Diagnosis not present

## 2023-06-23 DIAGNOSIS — E876 Hypokalemia: Secondary | ICD-10-CM | POA: Diagnosis not present

## 2023-06-23 DIAGNOSIS — I70262 Atherosclerosis of native arteries of extremities with gangrene, left leg: Secondary | ICD-10-CM | POA: Diagnosis present

## 2023-06-23 DIAGNOSIS — E1152 Type 2 diabetes mellitus with diabetic peripheral angiopathy with gangrene: Secondary | ICD-10-CM | POA: Diagnosis present

## 2023-06-23 DIAGNOSIS — I129 Hypertensive chronic kidney disease with stage 1 through stage 4 chronic kidney disease, or unspecified chronic kidney disease: Secondary | ICD-10-CM | POA: Diagnosis present

## 2023-06-23 DIAGNOSIS — F0390 Unspecified dementia without behavioral disturbance: Secondary | ICD-10-CM | POA: Diagnosis present

## 2023-06-23 DIAGNOSIS — Z91041 Radiographic dye allergy status: Secondary | ICD-10-CM | POA: Diagnosis not present

## 2023-06-23 DIAGNOSIS — H35033 Hypertensive retinopathy, bilateral: Secondary | ICD-10-CM | POA: Diagnosis present

## 2023-06-23 DIAGNOSIS — L97529 Non-pressure chronic ulcer of other part of left foot with unspecified severity: Secondary | ICD-10-CM | POA: Diagnosis present

## 2023-06-23 DIAGNOSIS — E785 Hyperlipidemia, unspecified: Secondary | ICD-10-CM | POA: Diagnosis not present

## 2023-06-23 DIAGNOSIS — E1122 Type 2 diabetes mellitus with diabetic chronic kidney disease: Secondary | ICD-10-CM | POA: Diagnosis present

## 2023-06-23 DIAGNOSIS — I739 Peripheral vascular disease, unspecified: Secondary | ICD-10-CM | POA: Diagnosis not present

## 2023-06-23 DIAGNOSIS — E7800 Pure hypercholesterolemia, unspecified: Secondary | ICD-10-CM | POA: Diagnosis present

## 2023-06-23 DIAGNOSIS — I25118 Atherosclerotic heart disease of native coronary artery with other forms of angina pectoris: Secondary | ICD-10-CM | POA: Diagnosis not present

## 2023-06-23 LAB — CBC
HCT: 26 % — ABNORMAL LOW (ref 39.0–52.0)
Hemoglobin: 8.4 g/dL — ABNORMAL LOW (ref 13.0–17.0)
MCH: 27.8 pg (ref 26.0–34.0)
MCHC: 32.3 g/dL (ref 30.0–36.0)
MCV: 86.1 fL (ref 80.0–100.0)
Platelets: 239 10*3/uL (ref 150–400)
RBC: 3.02 MIL/uL — ABNORMAL LOW (ref 4.22–5.81)
RDW: 14 % (ref 11.5–15.5)
WBC: 16.4 10*3/uL — ABNORMAL HIGH (ref 4.0–10.5)
nRBC: 0 % (ref 0.0–0.2)

## 2023-06-23 LAB — BASIC METABOLIC PANEL
Anion gap: 10 (ref 5–15)
BUN: 35 mg/dL — ABNORMAL HIGH (ref 8–23)
CO2: 15 mmol/L — ABNORMAL LOW (ref 22–32)
Calcium: 7.5 mg/dL — ABNORMAL LOW (ref 8.9–10.3)
Chloride: 111 mmol/L (ref 98–111)
Creatinine, Ser: 1.81 mg/dL — ABNORMAL HIGH (ref 0.61–1.24)
GFR, Estimated: 36 mL/min — ABNORMAL LOW (ref >60)
Glucose, Bld: 131 mg/dL — ABNORMAL HIGH (ref 70–99)
Potassium: 3.4 mmol/L — ABNORMAL LOW (ref 3.5–5.1)
Sodium: 136 mmol/L (ref 135–145)

## 2023-06-23 LAB — GLUCOSE, CAPILLARY
Glucose-Capillary: 182 mg/dL — ABNORMAL HIGH (ref 70–99)
Glucose-Capillary: 283 mg/dL — ABNORMAL HIGH (ref 70–99)
Glucose-Capillary: 152 mg/dL — ABNORMAL HIGH (ref 70–99)
Glucose-Capillary: 220 mg/dL — ABNORMAL HIGH (ref 70–99)

## 2023-06-23 LAB — SURGICAL PCR SCREEN
MRSA, PCR: NEGATIVE
Staphylococcus aureus: NEGATIVE

## 2023-06-23 MED ORDER — CHLORHEXIDINE GLUCONATE 4 % EX SOLN
60.0000 mL | Freq: Once | CUTANEOUS | Status: AC
  Administered 2023-06-24: 4 via TOPICAL
  Filled 2023-06-23: qty 60

## 2023-06-23 MED ORDER — CEFAZOLIN SODIUM-DEXTROSE 2-4 GM/100ML-% IV SOLN
2.0000 g | INTRAVENOUS | Status: AC
  Administered 2023-06-24: 2 g via INTRAVENOUS
  Filled 2023-06-23: qty 100

## 2023-06-23 MED ORDER — POVIDONE-IODINE 10 % EX SWAB
2.0000 | Freq: Once | CUTANEOUS | Status: AC
  Administered 2023-06-24: 2 via TOPICAL

## 2023-06-23 MED ORDER — STERILE WATER FOR INJECTION IV SOLN
INTRAVENOUS | Status: DC
  Filled 2023-06-23 (×2): qty 1000

## 2023-06-23 NOTE — Progress Notes (Signed)
Patient ID: Justin [name], male   DOB: [birth year], 89 y.o.   MRN: [id number] Patient is an 89 year old gentleman who is seen in follow-up for dry gangrene left great toe.  Patient is status post endovascular evaluation with Dr. Randie Heinz.  Patient has significant microvascular disease but has inline flow to the ankle.  Discussed with the patient recommendation at a minimum to proceed with amputation of the dry gangrenous great toe.  Discussed the patient is an increased risk of the wound not healing.  Discussed that if the wound did not heal patient would require a below-knee amputation.  Patient states he understands wished to proceed with a left foot first ray amputation tomorrow.

## 2023-06-23 NOTE — Progress Notes (Signed)
Mobility Specialist Progress Note:   06/23/23 1238  Mobility  Activity Ambulated with assistance to bathroom  Level of Assistance Standby assist, set-up cues, supervision of patient - no hands on  Assistive Device Front wheel walker  Distance Ambulated (ft) 30 ft  Activity Response Tolerated well  Mobility Referral Yes  $Mobility charge 1 Mobility  Mobility Specialist Start Time (ACUTE ONLY) 1217  Mobility Specialist Stop Time (ACUTE ONLY) 1225  Mobility Specialist Time Calculation (min) (ACUTE ONLY) 8 min   Pre Mobility: 74 HR During Mobility: 94 HR ,97% SpO2  Pt received in bed, requesting assistance to BR. SB assist to stand and ambulate. Asymptomatic throughout. Pt left in BR with all needs met. RN notified.   Leory Plowman  Mobility Specialist Please contact via [employer] office at [phone]

## 2023-06-23 NOTE — Progress Notes (Addendum)
[facility] [place]  Subjective  - no sure how sever his problem is, sems a little confused   Objective (!) 121/90 98 97.9 F (36.6 C) (Oral) 20 98%  Intake/Output Summary (Last 24 hours) at 06/23/2023 0658 Last data filed at 06/23/2023 0428 Gross per 24 hour  Intake 999.12 ml  Output --  Net 999.12 ml   Positive femoral pulses No pedal pulses Left GT non healing wound   Assessment/Planning: POD # 1  Aortogram with left LE run off for PAD with GT wound  Angio revealed small vessel disease not amendable to intervention  Large vessels are patent until PT occludes high, and AT to the ankle.  He will be at high risk of amputation BKA verses AKA.     Mosetta Pigeon 06/23/2023 6:58 AM --  Laboratory Lab Results: Recent Labs    06/20/23 0741 06/21/23 0854  WBC 10.3 11.3*  HGB 9.0* 9.0*  HCT 28.8* 27.3*  PLT 256 255   BMET Recent Labs    06/21/23 0854 06/22/23 0922  NA 136 138  K 4.0 4.1  CL 108 108  CO2 21* 16*  GLUCOSE 173* 179*  BUN 32* 33*  CREATININE 1.59* 1.81*  CALCIUM 8.9 9.1    COAG Lab Results  Component Value Date   INR 1.3 (H) 12/14/2018   No results found for: "PTT"   I have independently interviewed and examined patient and agree with PA assessment and plan above.  Case discussed with Dr. Lajoyce Corners as well as patient's daughter via telephone.  He has high risk for surgical intervention but also risk of infection at leaving dry gangrenous toe long-term.  Patient has agreed to proceed with left foot first ray amputation with Dr. Lajoyce Corners which seems like the most prudent plan although high risk for requiring below-knee amputation remains.  Sigourney Portillo C. Randie Heinz, MD [facility] [place] Office: [phone] Pager: [phone]

## 2023-06-23 NOTE — Progress Notes (Signed)
PROGRESS NOTE  Justin [name]  UUV:[id number] DOB: 1932-10-13 DOA: 06/19/2023 PCP: Estevan Oaks, NP   Brief Narrative: Patient is a 89 year old male with history of diabetes type 2, GERD, hypertension, hyperlipidemia, coronary disease status post CABG, peripheral artery disease, history of prostate cancer, CKD stage IIIb who presented to the emergency room with complaint of worsening ulcer on his left great toe.  Over the past week, the patient observed drainage of pus and blood when the dressing was being changed, had foul smell.  No history of fever or chills.  On presentation he was hypertensive, afebrile.  Lab work showed creatinine of 1.76, baseline creatinine of 1.4-1.5.  Left foot x-ray showed increased subcutaneous air within the distal aspect of the great toe suspicious for early osteomyelitis.  Vascular surgery, orthopedics consulted.  Underwent left lower extremity angiogram on 9/23 with finding of small vessel disease not amenable to intervention.  Orthopedics planning for left first ray amputation tomorrow  Assessment & Plan:  Principal Problem:   Gangrene of toe of left foot (HCC) Active Problems:   Claudication in peripheral vascular disease (HCC)   Normocytic anemia   CAD s/p CABG in 2009   CKD stage 3 due to type 2 diabetes mellitus (HCC)   Diabetes mellitus without complication (HCC)   Asymptomatic bilateral carotid artery stenosis   Primary hypertension   GERD (gastroesophageal reflux disease)   HLD (hyperlipidemia)   Osteomyelitis of great toe of left foot (HCC)   Gangrene of left lower extremity: Patient presented with  complaint of worsening ulcer on his left great toe.  Over the past week, the patient observed drainage of pus and blood when the dressing was being changed, had foul smell.  X-rays suggestive of  of osteomyelitis.  Vascular surgery, orthopedics consulted.  Continue broad-spectrum antibiotics.    Underwent left lower extremity angiogram on 9/23 with  finding of small vessel disease not amenable to intervention.  Orthopedics planning for left first ray amputation tomorrow.  He has developed leukocytosis today but he is afebrile.  This most likely reactive  Peripheral vascular disease: Follows with Dr Jacinto Halim, vascular surgery following here  Normocytic anemia: Likely from chronic medical conditions.  Hemoglobin stable.  Iron of 26.  Given IV iron  Coronary artery disease: Status post CABG, Anginal Symptoms.  On Lipitor, aspirin, Zetia, Zocor  CKD stage IIIb: Baseline creatinine 1.4-1.6.  Follows with nephrology as an outpatient.  Today's BMP pending  Diabetes type 2: A1c of 8.8.  Monitor blood sugars.  Continue sliding scale  insulin.  Bilateral carotid artery stenosis: Follows with cardiology  Hypertension: Monitor blood pressure.  On labetalol, Lasix, spironolactone, hydrochlorothiazide  Hyperlipidemia: Continue zokor, Zetia  GERD: Continue PPI  Peripheral neuropathy: Complained of bilateral lower extremity discomfort.  Continue gabapentin, increased the dose to 200 mg 3 times a day with improvement        DVT prophylaxis:heparin injection 5,000 Units Start: 06/19/23 2200     Code Status: Full Code  Family Communication: Called and discussed with daughter Eber Jones on phone on 9/21. Discussed with grandson at bedside on 9/23  Patient status:Inpatient  Patient is from :Home  Anticipated discharge KV:[id number]  Estimated DC date: After orthopedics, vascular surgery clearance   Consultants: vascular surgery  Procedures:None yet  Antimicrobials:  Anti-infectives (From admission, onward)    Start     Dose/Rate Route Frequency Ordered Stop   06/23/23 1800  vancomycin (VANCOCIN) IVPB 1000 mg/200 mL premix        1,000  mg 200 mL/hr over 60 Minutes Intravenous Every 36 hours 06/22/23 1153     06/21/23 1000  vancomycin (VANCOREADY) IVPB 750 mg/150 mL  Status:  Discontinued        750 mg 150 mL/hr over 60 Minutes Intravenous  Every 24 hours 06/20/23 0907 06/22/23 1153   06/20/23 1000  vancomycin (VANCOREADY) IVPB 500 mg/100 mL        500 mg 100 mL/hr over 60 Minutes Intravenous  Once 06/20/23 0907 06/20/23 1118   06/19/23 2000  metroNIDAZOLE (FLAGYL) IVPB 500 mg        500 mg 100 mL/hr over 60 Minutes Intravenous Every 12 hours 06/19/23 1805     06/19/23 1930  cefTRIAXone (ROCEPHIN) 2 g in sodium chloride 0.9 % 100 mL IVPB        2 g 200 mL/hr over 30 Minutes Intravenous Every 24 hours 06/19/23 1839     06/19/23 1900  cefTRIAXone (ROCEPHIN) 1 g in sodium chloride 0.9 % 100 mL IVPB  Status:  Discontinued        1 g 200 mL/hr over 30 Minutes Intravenous Every 24 hours 06/19/23 1805 06/19/23 1832   06/19/23 1630  vancomycin (VANCOCIN) IVPB 1000 mg/200 mL premix        1,000 mg 200 mL/hr over 60 Minutes Intravenous  Once 06/19/23 1615 06/19/23 1800       Subjective: Patient seen and examined at bedside today.  Hemodynamically stable.  Comfortable lying in bed.  Denies any significant pain on the left foot today.  He understands about the importance of upcoming surgery from orthopedics  Objective: Vitals:   06/22/23 2125 06/22/23 2300 06/23/23 0345 06/23/23 0818  BP:  136/70 (!) 121/90 (!) 144/69  Pulse: 98     Resp:  20 20 18   Temp:  97.8 F (36.6 C) 97.9 F (36.6 C) 98.7 F (37.1 C)  TempSrc:  Oral Oral Oral  SpO2:  98%    Weight:      Height:        Intake/Output Summary (Last 24 hours) at 06/23/2023 1046 Last data filed at 06/23/2023 0428 Gross per 24 hour  Intake 999.12 ml  Output --  Net 999.12 ml    Filed Weights   06/19/23 1315  Weight: 71.2 kg    Examination:  General exam: Overall comfortable, not in distress, pleasant elderly male HEENT: PERRL Respiratory system:  no wheezes or crackles  Cardiovascular system: S1 & S2 heard, RRR.  Gastrointestinal system: Abdomen is nondistended, soft and nontender. Central nervous system: Alert and oriented Extremities: No edema, no  clubbing ,no cyanosis, gangrenous left toe Skin: No rashes, no ulcers,no icterus     Data Reviewed: I have personally reviewed following labs and imaging studies  CBC: Recent Labs  Lab 06/19/23 1319 06/20/23 0741 06/21/23 0854 06/23/23 0829  WBC 11.1* 10.3 11.3* 16.4*  NEUTROABS 8.7*  --   --   --   HGB 9.1* 9.0* 9.0* 8.4*  HCT 28.4* 28.8* 27.3* 26.0*  MCV 87.7 85.0 83.5 86.1  PLT 250 256 255 239   Basic Metabolic Panel: Recent Labs  Lab 06/19/23 1319 06/20/23 0741 06/21/23 0854 06/22/23 0922  NA 135 135 136 138  K 4.7 4.7 4.0 4.1  CL 106 107 108 108  CO2 19* 17* 21* 16*  GLUCOSE 233* 199* 173* 179*  BUN 39* 34* 32* 33*  CREATININE 1.76* 1.56* 1.59* 1.81*  CALCIUM 9.0 9.0 8.9 9.1     Recent Results (from the past  240 hour(s))  Culture, blood (Routine X 2) w Reflex to ID Panel     Status: None (Preliminary result)   Collection Time: 06/20/23  8:55 AM   Specimen: BLOOD RIGHT ARM  Result Value Ref Range Status   Specimen Description BLOOD RIGHT ARM  Final   Special Requests   Final    BOTTLES DRAWN AEROBIC AND ANAEROBIC Blood Culture adequate volume   Culture   Final    NO GROWTH 3 DAYS Performed at [facility], [address], [place], [place] [address]    Report Status PENDING  Incomplete  Culture, blood (Routine X 2) w Reflex to ID Panel     Status: None (Preliminary result)   Collection Time: 06/20/23  8:57 AM   Specimen: BLOOD RIGHT ARM  Result Value Ref Range Status   Specimen Description BLOOD RIGHT ARM  Final   Special Requests   Final    BOTTLES DRAWN AEROBIC AND ANAEROBIC Blood Culture adequate volume   Culture   Final    NO GROWTH 3 DAYS Performed at [facility], [address] [place], [place] [address]    Report Status PENDING  Incomplete     Radiology Studies: PERIPHERAL VASCULAR CATHETERIZATION  Result Date: 06/22/2023 Images from the original result were not included. Patient name: GIUSEPPE OMO MRN: [id number] DOB:  1933/02/07 Sex: male 06/22/2023 Pre-operative Diagnosis: Atherosclerosis native arteries with left great toe gangrene Post-operative diagnosis:  Same Surgeon:  Luanna Salk. Randie Heinz, MD Procedure Performed: 1.  Percutaneous ultrasound guided access and Mynx device closure right common femoral artery 2.  Catheter aorta with aortogram and bilateral lower extremity angiography 3.  Selection with catheter of left popliteal artery Indications: 89 year old male with history of peripheral arterial disease now with left great toe gangrene likely will require amputation with monophasic ABIs indicated for angiography. Findings: The aorta and iliac segments are free of flow-limiting stenosis although they are calcified.  Bilateral common femoral arteries and proximal SFAs are calcified.  On the left side the SFA is heavily calcified throughout without flow-limiting stenosis in the popliteal artery also similarly without stenosis.  Posterior tibial artery has a high takeoff there is no frank tibioperoneal trunk but the posterior tibial artery occludes after the takeoff and is quite diminutive.  The anterior tibial artery is the dominant runoff to the level of the ankle where then has stenosis and does flow onto the foot although there is no distal flow.  I elected for no intervention given that this appears to be small vessel and outflow issue of the left foot.  Procedure:  The patient was identified in the holding area and taken to room 8.  The patient was then placed supine on the table and prepped and draped in the usual sterile fashion.  A time out was called.  Ultrasound was used to evaluate the right common femoral artery which was calcified although there was no luminal stenosis.  The area was anesthetized with 1% lidocaine and cannulated with a micropuncture needle followed by wire and a sheath.  An ultrasound image was saved the permanent record.  We placed a Bentson wire followed by 5 [place] sheath and Omni catheter to the  level of L1 where CO2 aortogram was performed and the catheter was pulled to the bifurcation bilateral lower extremity angiography was performed.  We then crossed the bifurcation with Bentson wire and Omni catheter placed this in the left common femoral artery and perform left lower extremity angiography.  We then placed a straight catheter over a wire all the way to the popliteal artery at the level of the knee and perform contrast angiography below the knee.  With the above findings we elected no intervention the catheter was removed and a minx device was deployed.  He tolerated the procedure without any complication. Contrast: 15 cc Brandon C. Randie Heinz, MD [facility] Office: [phone] Pager: [phone]   Scheduled Meds:  aspirin  81 mg Oral Daily   DULoxetine  60 mg Oral Daily   ezetimibe  10 mg Oral QPM   furosemide  40 mg Oral Daily   gabapentin  200 mg Oral TID   heparin injection (subcutaneous)  5,000 Units Subcutaneous Q8H   spironolactone  12.5 mg Oral Daily   And   hydrochlorothiazide  12.5 mg Oral Daily   influenza vaccine adjuvanted  0.5 mL Intramuscular Tomorrow-1000   insulin aspart  0-9 Units Subcutaneous TID WC   insulin glargine-yfgn  10 Units Subcutaneous Daily   labetalol  100 mg Oral BID   pantoprazole  80 mg Oral Daily   pneumococcal 20-valent conjugate vaccine  0.5 mL Intramuscular Tomorrow-1000   polyethylene glycol  17 g Oral Daily   ropinirole  5 mg Oral QHS   senna  1 tablet Oral BID   simvastatin  20 mg Oral QHS   sodium chloride flush  3 mL Intravenous Q12H   tamsulosin  0.4 mg Oral QPC supper   Continuous Infusions:  sodium chloride 100 mL/hr at 06/23/23 0641   sodium chloride     cefTRIAXone (ROCEPHIN)  IV Stopped (06/22/23 1816)   ferric gluconate (FERRLECIT) IVPB Stopped (06/23/23 0123)   metronidazole 500 mg (06/23/23 0941)   vancomycin       LOS: 4 days   Burnadette Pop, MD [facility] P9/24/2024, 10:46  AM

## 2023-06-23 NOTE — Progress Notes (Signed)
Mobility Specialist Progress Note:   06/23/23 1242  Mobility  Activity Ambulated with assistance in hallway  Level of Assistance Contact guard assist, steadying assist  Assistive Device Front wheel walker  Distance Ambulated (ft) 200 ft  Activity Response Tolerated well  Mobility Referral Yes  $Mobility charge 1 Mobility  Mobility Specialist Start Time (ACUTE ONLY) 1230  Mobility Specialist Stop Time (ACUTE ONLY) 1238  Mobility Specialist Time Calculation (min) (ACUTE ONLY) 8 min   During Mobility: 100 HR  Post Mobility: 91 HR   Pt received in BR, agreeable to ambulate in hallway. C/o slight SOB during ambulation, otherwise asymptomatic throughout. Pt returned to bed with call bell in reach and all needs met.   Justin [name]  Mobility Specialist Please contact via [employer] office at [phone]

## 2023-06-23 NOTE — Plan of Care (Signed)

## 2023-06-23 NOTE — H&P (View-Only) (Signed)
Patient ID: Justin [name], male   DOB: [birth year], 89 y.o.   MRN: [id number] Patient is an 89 year old gentleman who is seen in follow-up for dry gangrene left great toe.  Patient is status post endovascular evaluation with Dr. Randie Heinz.  Patient has significant microvascular disease but has inline flow to the ankle.  Discussed with the patient recommendation at a minimum to proceed with amputation of the dry gangrenous great toe.  Discussed the patient is an increased risk of the wound not healing.  Discussed that if the wound did not heal patient would require a below-knee amputation.  Patient states he understands wished to proceed with a left foot first ray amputation tomorrow.

## 2023-06-24 ENCOUNTER — Encounter (HOSPITAL_COMMUNITY)
Admission: EM | Disposition: A | Discharge status: Skilled Nursing Facility | Payer: Self-pay | Source: Home / Self Care | Attending: Internal Medicine

## 2023-06-24 ENCOUNTER — Other Ambulatory Visit: Payer: Self-pay

## 2023-06-24 ENCOUNTER — Inpatient Hospital Stay (HOSPITAL_COMMUNITY): Payer: Medicare Other | Admitting: Anesthesiology

## 2023-06-24 ENCOUNTER — Encounter (HOSPITAL_COMMUNITY): Payer: Self-pay | Admitting: Family Medicine

## 2023-06-24 DIAGNOSIS — N183 Chronic kidney disease, stage 3 unspecified: Secondary | ICD-10-CM | POA: Diagnosis not present

## 2023-06-24 DIAGNOSIS — Z87891 Personal history of nicotine dependence: Secondary | ICD-10-CM

## 2023-06-24 DIAGNOSIS — E1152 Type 2 diabetes mellitus with diabetic peripheral angiopathy with gangrene: Secondary | ICD-10-CM | POA: Diagnosis not present

## 2023-06-24 DIAGNOSIS — I129 Hypertensive chronic kidney disease with stage 1 through stage 4 chronic kidney disease, or unspecified chronic kidney disease: Secondary | ICD-10-CM | POA: Diagnosis not present

## 2023-06-24 DIAGNOSIS — I96 Gangrene, not elsewhere classified: Secondary | ICD-10-CM | POA: Diagnosis not present

## 2023-06-24 HISTORY — PX: AMPUTATION: SHX166

## 2023-06-24 LAB — CBC
HCT: 22.4 % — ABNORMAL LOW (ref 39.0–52.0)
Hemoglobin: 7.2 g/dL — ABNORMAL LOW (ref 13.0–17.0)
MCH: 27 pg (ref 26.0–34.0)
MCHC: 32.1 g/dL (ref 30.0–36.0)
MCV: 83.9 fL (ref 80.0–100.0)
Platelets: 214 10*3/uL (ref 150–400)
RBC: 2.67 MIL/uL — ABNORMAL LOW (ref 4.22–5.81)
RDW: 13.9 % (ref 11.5–15.5)
WBC: 14.2 10*3/uL — ABNORMAL HIGH (ref 4.0–10.5)
nRBC: 0 % (ref 0.0–0.2)

## 2023-06-24 LAB — GLUCOSE, CAPILLARY
Glucose-Capillary: 136 mg/dL — ABNORMAL HIGH (ref 70–99)
Glucose-Capillary: 131 mg/dL — ABNORMAL HIGH (ref 70–99)
Glucose-Capillary: 148 mg/dL — ABNORMAL HIGH (ref 70–99)

## 2023-06-24 LAB — BASIC METABOLIC PANEL
Anion gap: 8 (ref 5–15)
BUN: 31 mg/dL — ABNORMAL HIGH (ref 8–23)
CO2: 23 mmol/L (ref 22–32)
Calcium: 8.1 mg/dL — ABNORMAL LOW (ref 8.9–10.3)
Chloride: 103 mmol/L (ref 98–111)
Creatinine, Ser: 1.83 mg/dL — ABNORMAL HIGH (ref 0.61–1.24)
GFR, Estimated: 35 mL/min — ABNORMAL LOW (ref >60)
Glucose, Bld: 162 mg/dL — ABNORMAL HIGH (ref 70–99)
Potassium: 3.4 mmol/L — ABNORMAL LOW (ref 3.5–5.1)
Sodium: 134 mmol/L — ABNORMAL LOW (ref 135–145)

## 2023-06-24 SURGERY — AMPUTATION, FOOT, RAY
Anesthesia: Monitor Anesthesia Care | Site: Leg Lower | Laterality: Left

## 2023-06-24 MED ORDER — LABETALOL HCL 5 MG/ML IV SOLN: 10.0000 mg | INTRAVENOUS | Status: DC | PRN

## 2023-06-24 MED ORDER — CHLORHEXIDINE GLUCONATE 0.12 % MT SOLN
OROMUCOSAL | Status: AC
  Filled 2023-06-24: qty 15

## 2023-06-24 MED ORDER — GUAIFENESIN-DM 100-10 MG/5ML PO SYRP: 15.0000 mL | ORAL_SOLUTION | ORAL | Status: DC | PRN

## 2023-06-24 MED ORDER — ALUM & MAG HYDROXIDE-SIMETH 200-200-20 MG/5ML PO SUSP: 15.0000 mL | ORAL | Status: DC | PRN

## 2023-06-24 MED ORDER — PROPOFOL 10 MG/ML IV BOLUS
INTRAVENOUS | Status: DC | PRN
  Administered 2023-06-24: 50 mg via INTRAVENOUS
  Administered 2023-06-24: 100 mg via INTRAVENOUS

## 2023-06-24 MED ORDER — BISACODYL 5 MG PO TBEC: 5.0000 mg | DELAYED_RELEASE_TABLET | Freq: Every day | ORAL | Status: DC | PRN

## 2023-06-24 MED ORDER — OXYCODONE HCL 5 MG PO TABS: 10.0000 mg | ORAL_TABLET | ORAL | Status: DC | PRN

## 2023-06-24 MED ORDER — ONDANSETRON HCL 4 MG/2ML IJ SOLN
INTRAMUSCULAR | Status: AC
  Filled 2023-06-24: qty 2

## 2023-06-24 MED ORDER — OXYCODONE HCL 5 MG/5ML PO SOLN: 5.0000 mg | Freq: Once | ORAL | Status: DC | PRN

## 2023-06-24 MED ORDER — INSULIN ASPART 100 UNIT/ML IJ SOLN: 0.0000 [IU] | INTRAMUSCULAR | Status: DC | PRN

## 2023-06-24 MED ORDER — LACTATED RINGERS IV SOLN: INTRAVENOUS | Status: DC

## 2023-06-24 MED ORDER — OXYCODONE HCL 5 MG PO TABS: 5.0000 mg | ORAL_TABLET | Freq: Once | ORAL | Status: DC | PRN

## 2023-06-24 MED ORDER — ACETAMINOPHEN 325 MG PO TABS
325.0000 mg | ORAL_TABLET | Freq: Four times a day (QID) | ORAL | Status: DC | PRN
  Administered 2023-06-30 – 2023-07-01 (×2): 650 mg via ORAL
  Filled 2023-06-24: qty 2

## 2023-06-24 MED ORDER — PROPOFOL 1000 MG/100ML IV EMUL
INTRAVENOUS | Status: AC
  Filled 2023-06-24: qty 100

## 2023-06-24 MED ORDER — PROMETHAZINE HCL 25 MG/ML IJ SOLN: 6.2500 mg | INTRAMUSCULAR | Status: DC | PRN

## 2023-06-24 MED ORDER — CEFAZOLIN SODIUM-DEXTROSE 2-4 GM/100ML-% IV SOLN: 2.0000 g | Freq: Three times a day (TID) | INTRAVENOUS | Status: DC

## 2023-06-24 MED ORDER — SODIUM CHLORIDE 0.9 % IV SOLN: INTRAVENOUS | Status: AC

## 2023-06-24 MED ORDER — POTASSIUM CHLORIDE 10 MEQ/100ML IV SOLN
10.0000 meq | INTRAVENOUS | Status: AC
  Filled 2023-06-24: qty 100

## 2023-06-24 MED ORDER — POTASSIUM CHLORIDE 10 MEQ/100ML IV SOLN
10.0000 meq | INTRAVENOUS | Status: AC
  Administered 2023-06-24 – 2023-06-25 (×4): 10 meq via INTRAVENOUS
  Filled 2023-06-24 (×3): qty 100

## 2023-06-24 MED ORDER — OXYCODONE HCL 5 MG PO TABS
5.0000 mg | ORAL_TABLET | ORAL | Status: DC | PRN
  Administered 2023-06-26: 5 mg via ORAL
  Filled 2023-06-24: qty 2
  Filled 2023-06-24: qty 1

## 2023-06-24 MED ORDER — FENTANYL CITRATE (PF) 100 MCG/2ML IJ SOLN
INTRAMUSCULAR | Status: AC
  Administered 2023-06-24: 50 ug via INTRAVENOUS
  Filled 2023-06-24: qty 2

## 2023-06-24 MED ORDER — BUPIVACAINE HCL (PF) 0.5 % IJ SOLN
INTRAMUSCULAR | Status: DC | PRN
  Administered 2023-06-24: 7 mL via PERINEURAL
  Administered 2023-06-24: 13 mL via PERINEURAL

## 2023-06-24 MED ORDER — FENTANYL CITRATE (PF) 100 MCG/2ML IJ SOLN: 50.0000 ug | Freq: Once | INTRAMUSCULAR | Status: AC

## 2023-06-24 MED ORDER — HYDROMORPHONE HCL 1 MG/ML IJ SOLN: 0.2500 mg | INTRAMUSCULAR | Status: DC | PRN

## 2023-06-24 MED ORDER — SODIUM CHLORIDE 0.9 % IV SOLN: INTRAVENOUS | Status: DC

## 2023-06-24 MED ORDER — POTASSIUM CHLORIDE CRYS ER 20 MEQ PO TBCR: 20.0000 meq | EXTENDED_RELEASE_TABLET | Freq: Every day | ORAL | Status: DC | PRN

## 2023-06-24 MED ORDER — EPHEDRINE 5 MG/ML INJ
INTRAVENOUS | Status: AC
  Filled 2023-06-24: qty 5

## 2023-06-24 MED ORDER — EPHEDRINE SULFATE-NACL 50-0.9 MG/10ML-% IV SOSY
PREFILLED_SYRINGE | INTRAVENOUS | Status: DC | PRN
  Administered 2023-06-24: 10 mg via INTRAVENOUS
  Administered 2023-06-24: 5 mg via INTRAVENOUS

## 2023-06-24 MED ORDER — POLYETHYLENE GLYCOL 3350 17 G PO PACK: 17.0000 g | PACK | Freq: Every day | ORAL | Status: DC | PRN

## 2023-06-24 MED ORDER — MIDAZOLAM HCL 2 MG/2ML IJ SOLN: 0.5000 mg | Freq: Once | INTRAMUSCULAR | Status: DC | PRN

## 2023-06-24 MED ORDER — METOPROLOL TARTRATE 5 MG/5ML IV SOLN: 2.0000 mg | INTRAVENOUS | Status: DC | PRN

## 2023-06-24 MED ORDER — ONDANSETRON HCL 4 MG/2ML IJ SOLN
INTRAMUSCULAR | Status: DC | PRN
  Administered 2023-06-24: 4 mg via INTRAVENOUS

## 2023-06-24 MED ORDER — PHENYLEPHRINE 80 MCG/ML (10ML) SYRINGE FOR IV PUSH (FOR BLOOD PRESSURE SUPPORT)
PREFILLED_SYRINGE | INTRAVENOUS | Status: DC | PRN
  Administered 2023-06-24 (×2): 80 ug via INTRAVENOUS

## 2023-06-24 MED ORDER — HYDROMORPHONE HCL 1 MG/ML IJ SOLN: 0.5000 mg | INTRAMUSCULAR | Status: DC | PRN

## 2023-06-24 MED ORDER — PROPOFOL 10 MG/ML IV BOLUS
INTRAVENOUS | Status: AC
  Filled 2023-06-24: qty 20

## 2023-06-24 MED ORDER — LIDOCAINE 2% (20 MG/ML) 5 ML SYRINGE
INTRAMUSCULAR | Status: DC | PRN
  Administered 2023-06-24: 40 mg via INTRAVENOUS

## 2023-06-24 MED ORDER — PHENOL 1.4 % MT LIQD: 1.0000 | OROMUCOSAL | Status: DC | PRN

## 2023-06-24 MED ORDER — JUVEN PO PACK
1.0000 | PACK | Freq: Two times a day (BID) | ORAL | Status: DC
  Administered 2023-06-25 – 2023-07-01 (×13): 1 via ORAL
  Filled 2023-06-24 (×13): qty 1

## 2023-06-24 MED ORDER — BUPIVACAINE LIPOSOME 1.3 % IJ SUSP
INTRAMUSCULAR | Status: DC | PRN
  Administered 2023-06-24: 7 mL via PERINEURAL
  Administered 2023-06-24: 3 mL via PERINEURAL

## 2023-06-24 MED ORDER — VASHE WOUND IRRIGATION OPTIME
TOPICAL | Status: DC | PRN
  Administered 2023-06-24: 34 [oz_av]

## 2023-06-24 MED ORDER — VITAMIN C 500 MG PO TABS
1000.0000 mg | ORAL_TABLET | Freq: Every day | ORAL | Status: DC
  Administered 2023-06-25 – 2023-07-01 (×7): 1000 mg via ORAL
  Filled 2023-06-24 (×7): qty 2

## 2023-06-24 MED ORDER — PANTOPRAZOLE SODIUM 40 MG PO TBEC: 40.0000 mg | DELAYED_RELEASE_TABLET | Freq: Every day | ORAL | Status: DC

## 2023-06-24 MED ORDER — DEXAMETHASONE SODIUM PHOSPHATE 10 MG/ML IJ SOLN
INTRAMUSCULAR | Status: DC | PRN
  Administered 2023-06-24: 10 mg via INTRAVENOUS

## 2023-06-24 MED ORDER — LIDOCAINE 2% (20 MG/ML) 5 ML SYRINGE
INTRAMUSCULAR | Status: AC
  Filled 2023-06-24: qty 5

## 2023-06-24 MED ORDER — HYDRALAZINE HCL 20 MG/ML IJ SOLN: 5.0000 mg | INTRAMUSCULAR | Status: DC | PRN

## 2023-06-24 MED ORDER — 0.9 % SODIUM CHLORIDE (POUR BTL) OPTIME
TOPICAL | Status: DC | PRN
  Administered 2023-06-24: 1000 mL

## 2023-06-24 MED ORDER — PHENYLEPHRINE 80 MCG/ML (10ML) SYRINGE FOR IV PUSH (FOR BLOOD PRESSURE SUPPORT)
PREFILLED_SYRINGE | INTRAVENOUS | Status: AC
  Filled 2023-06-24: qty 10

## 2023-06-24 MED ORDER — ONDANSETRON HCL 4 MG/2ML IJ SOLN: 4.0000 mg | Freq: Four times a day (QID) | INTRAMUSCULAR | Status: DC | PRN

## 2023-06-24 MED ORDER — DOCUSATE SODIUM 100 MG PO CAPS
100.0000 mg | ORAL_CAPSULE | Freq: Every day | ORAL | Status: DC
  Administered 2023-06-26 – 2023-06-27 (×2): 100 mg via ORAL
  Filled 2023-06-24 (×3): qty 1

## 2023-06-24 MED ORDER — ZINC SULFATE 220 (50 ZN) MG PO CAPS
220.0000 mg | ORAL_CAPSULE | Freq: Every day | ORAL | Status: DC
  Administered 2023-06-25 – 2023-07-01 (×8): 220 mg via ORAL
  Filled 2023-06-24 (×7): qty 1

## 2023-06-24 MED ORDER — MAGNESIUM SULFATE 2 GM/50ML IV SOLN: 2.0000 g | Freq: Every day | INTRAVENOUS | Status: DC | PRN

## 2023-06-24 MED ORDER — MAGNESIUM CITRATE PO SOLN: 1.0000 | Freq: Once | ORAL | Status: DC | PRN

## 2023-06-24 SURGICAL SUPPLY — 41 items
BAG COUNTER SPONGE SURGICOUNT (BAG) ×1 IMPLANT
BAG SPNG CNTER NS LX DISP (BAG) ×1
BLADE SAW RECIP 87.9 MT (BLADE) IMPLANT
BLADE SAW SGTL MED 73X18.5 STR (BLADE) IMPLANT
BLADE SURG 21 STRL SS (BLADE) ×1 IMPLANT
BNDG COHESIVE 4X5 TAN STRL (GAUZE/BANDAGES/DRESSINGS) ×1 IMPLANT
BNDG GAUZE DERMACEA FLUFF 4 (GAUZE/BANDAGES/DRESSINGS) ×1 IMPLANT
BNDG GZE DERMACEA 4 6PLY (GAUZE/BANDAGES/DRESSINGS)
CANISTER WOUNDNEG PRESSURE 500 (CANNISTER) IMPLANT
CLEANSER WND VASHE INSTL 34OZ (WOUND CARE) IMPLANT
COVER SURGICAL LIGHT HANDLE (MISCELLANEOUS) ×2 IMPLANT
DRAPE INCISE IOBAN 66X45 STRL (DRAPES) IMPLANT
DRAPE U-SHAPE 47X51 STRL (DRAPES) ×2 IMPLANT
DRESSING PREVENA PLUS CUSTOM (GAUZE/BANDAGES/DRESSINGS) IMPLANT
DRSG ADAPTIC 3X8 NADH LF (GAUZE/BANDAGES/DRESSINGS) ×1 IMPLANT
DRSG PREVENA PLUS CUSTOM (GAUZE/BANDAGES/DRESSINGS) ×1
DURAPREP 26ML APPLICATOR (WOUND CARE) ×1 IMPLANT
ELECT REM PT RETURN 9FT ADLT (ELECTROSURGICAL) ×1
ELECTRODE REM PT RTRN 9FT ADLT (ELECTROSURGICAL) ×1 IMPLANT
GAUZE PAD ABD 8X10 STRL (GAUZE/BANDAGES/DRESSINGS) ×2 IMPLANT
GAUZE SPONGE 4X4 12PLY STRL (GAUZE/BANDAGES/DRESSINGS) ×1 IMPLANT
GLOVE BIOGEL PI IND STRL 9 (GLOVE) ×1 IMPLANT
GLOVE SURG ORTHO 9.0 STRL STRW (GLOVE) ×1 IMPLANT
GOWN STRL REUS W/ TWL XL LVL3 (GOWN DISPOSABLE) ×2 IMPLANT
GOWN STRL REUS W/TWL XL LVL3 (GOWN DISPOSABLE) ×2
GRAFT SKIN WND MICRO 38 (Tissue) IMPLANT
KIT BASIN OR (CUSTOM PROCEDURE TRAY) ×1 IMPLANT
KIT TURNOVER KIT B (KITS) ×1 IMPLANT
NS IRRIG 1000ML POUR BTL (IV SOLUTION) ×1 IMPLANT
PACK ORTHO EXTREMITY (CUSTOM PROCEDURE TRAY) ×1 IMPLANT
PAD ARMBOARD 7.5X6 YLW CONV (MISCELLANEOUS) ×2 IMPLANT
PREVENA RESTOR ARTHOFORM 46X30 (CANNISTER) IMPLANT
STOCKINETTE IMPERVIOUS LG (DRAPES) IMPLANT
SUT ETHILON 2 0 PSLX (SUTURE) ×1 IMPLANT
SUT SILK 3 0 (SUTURE) ×1
SUT SILK 3-0 18XBRD TIE 12 (SUTURE) IMPLANT
SUT VIC AB 1 CTX 36 (SUTURE) ×2
SUT VIC AB 1 CTX36XBRD ANBCTRL (SUTURE) IMPLANT
TOWEL GREEN STERILE (TOWEL DISPOSABLE) ×1 IMPLANT
TUBE CONNECTING 12X1/4 (SUCTIONS) ×1 IMPLANT
YANKAUER SUCT BULB TIP NO VENT (SUCTIONS) ×1 IMPLANT

## 2023-06-24 NOTE — Anesthesia Procedure Notes (Signed)
Procedure Name: LMA Insertion Date/Time: 06/24/2023 2:47 PM  Performed by: Earlene Plater, CRNAPre-anesthesia Checklist: Patient identified, Emergency Drugs available, Suction available, Patient being monitored and Timeout performed Patient Re-evaluated:Patient Re-evaluated prior to induction Oxygen Delivery Method: Circle system utilized Preoxygenation: Pre-oxygenation with 100% oxygen Induction Type: IV induction Ventilation: Mask ventilation without difficulty LMA: LMA inserted LMA Size: 5.0 Number of attempts: 1 Placement Confirmation: positive ETCO2, CO2 detector and breath sounds checked- equal and bilateral Tube secured with: Tape (Confirmed by Dr. Jean Rosenthal) Dental Injury: Teeth and Oropharynx as per pre-operative assessment

## 2023-06-24 NOTE — Anesthesia Procedure Notes (Signed)
Anesthesia Regional Block: Adductor canal block   Pre-Anesthetic Checklist: , timeout performed,  Correct Patient, Correct Site, Correct Laterality,  Correct Procedure, Correct Position, site marked,  Risks and benefits discussed,  Surgical consent,  Pre-op evaluation,  At surgeon's request and post-op pain management  Laterality: Left and Lower  Prep: chloraprep       Needles:  Injection technique: Single-shot  Needle Type: Echogenic Needle     Needle Length: 9cm  Needle Gauge: 21     Additional Needles:   Procedures:,,,, ultrasound used (permanent image in chart),,    Narrative:  Start time: 06/24/2023 2:02 PM End time: 06/24/2023 2:08 PM Injection made incrementally with aspirations every 5 mL.  Performed by: Personally  Anesthesiologist: Jairo Ben, MD  Additional Notes: Pt identified in Holding room.  Monitors applied. Working IV access confirmed. Timeout, Sterile prep L thigh.  #21ga ECHOgenic Arrow block needle into adductor canal with US guidance.  7cc 0.5% Bupivacaine with 3 cc Exparel  injected incrementally after negative test dose.  Patient asymptomatic, VSS, no heme aspirated, tolerated well.   Sandford Craze, MD

## 2023-06-24 NOTE — Anesthesia Preprocedure Evaluation (Addendum)
Anesthesia Evaluation  Patient identified by MRN, date of birth, ID band Patient awake    Reviewed: Allergy & Precautions, NPO status , Patient's Chart, lab work & pertinent test results  History of Anesthesia Complications Negative for: history of anesthetic complications  Airway Mallampati: II  TM Distance: >3 FB Neck ROM: Full    Dental  (+) Dental Advisory Given   Pulmonary former smoker   breath sounds clear to auscultation       Cardiovascular hypertension, Pt. on medications (-) angina + CAD, + Past MI and + Peripheral Vascular Disease   Rhythm:Regular Rate:Normal  '22 ECHO: EF 60 to 65%.  1. The LV has normal function, no regional wall motion abnormalities. There is severe left ventricular hypertrophy. Grade I diastolic dysfunction (impaired relaxation).   2. RVF is normal. The right ventricular size is normal.   3. Left atrial size was mildly dilated.   4. The mitral valve is abnormal. Mild to moderate MR.   5. The aortic valve is tricuspid. Aortic valve regurgitation is not visualized. Mild aortic valve sclerosis is present, with no evidence of aortic valve stenosis.   '21 Stress: Lexiscan nuclear stress test performed using 1-day protocol. SPECT images showed small sized, mild intensity, mildly reversible perfusion defect in apical to basal inferior myocardium. Stress LVEF 53%.    Neuro/Psych   Anxiety     negative neurological ROS     GI/Hepatic Neg liver ROS,GERD  Medicated and Controlled,,  Endo/Other  diabetes (glu 131), Insulin Dependent    Renal/GU Renal InsufficiencyRenal disease     Musculoskeletal   Abdominal   Peds  Hematology  (+) Blood dyscrasia (Hb 7.2), anemia   Anesthesia Other Findings H/o prostate cancer  Reproductive/Obstetrics                             Anesthesia Physical Anesthesia Plan  ASA: 3  Anesthesia Plan: Regional and MAC   Post-op Pain  Management: Tylenol PO (pre-op)*   Induction:   PONV Risk Score and Plan: 1 and Treatment may vary due to age or medical condition  Airway Management Planned:   Additional Equipment: None  Intra-op Plan:   Post-operative Plan:   Informed Consent: I have reviewed the patients History and Physical, chart, labs and discussed the procedure including the risks, benefits and alternatives for the proposed anesthesia with the patient or authorized representative who has indicated his/her understanding and acceptance.     Dental advisory given  Plan Discussed with: CRNA and Surgeon  Anesthesia Plan Comments: (Plan routine monitors, GA- LMA OK, popliteal and adductor canal blocks for post op analgesia)        Anesthesia Quick Evaluation

## 2023-06-24 NOTE — Transfer of Care (Signed)
Immediate Anesthesia Transfer of Care Note  Patient: Justin [name]  Procedure(s) Performed: LEFT BELOW THE KNEE AMPUTATION (Left: Leg Lower)  Patient Location: PACU  Anesthesia Type:General  Level of Consciousness: awake, alert , oriented, patient cooperative, and responds to stimulation  Airway & Oxygen Therapy: Patient Spontanous Breathing and Patient connected to face mask oxygen  Post-op Assessment: Report given to RN and Post -op Vital signs reviewed and stable  Post vital signs: Reviewed and stable  Last Vitals:  Vitals Value Taken Time  BP 149/61   (88) 06/24/23 1537    Temp 97.76F 06/24/23 1537    Pulse 55 06/24/23 1537  Resp 19 06/24/23 1537  SpO2 100 % 06/24/23 1537  Vitals shown include unfiled device data.     Complications: No notable events documented.

## 2023-06-24 NOTE — Progress Notes (Signed)
[facility] Pre-op Review 1.Surgical orders: Consent orders and signed  Yes Antibiotic: Ancef 2G OCTOR   Rocephin 2G 1714 06/23/23 Vancomycin 1000mg  2025 06/23/23 Povidone-iodine due  2.  Pre-procedure checklist completed: Yes  3.  NPO: 06/23/23 2200  4.  CHG Bath completed:  Not completed Belongings removed  Placed in clean gown  5.  Labs Performed: CBC   Abnormal Hgb 7.2  CMP--BMP  Abnormal Type and Screen:  Stat order placed  Surgical PCR: NA  Pregnancy:   NA EKG:   03/16/23 Chest x-ray: NA  6.  Recent H&P or progress note if inpatient: H&P 06/24/23  7.  Language Barrier:  NA  8. Stable Vital Signs  06:15 141/45 Oxygen:  RA   9.  Medications: Cardiac Drips:  NA Pain Medications: Gabapentin 200mg  2229 06/23/23 Beta Blocker:   Labetalol 100mg  PO 2229 06/23/23 Anticoagulants:  Heparin 0648 GLP1: Pre-op Medications Ordered:  10. IV access:  11. Diabetic:     CBG:  136    @   06:15   Aspart 1 UNIT given @ 0648 06/24/23 Perioperative Glycemic Control Scale:    Sensitive   Floor Nurse Name and Contact information:   Georgia Lopes, RN  Chat message with Georgia Lopes, RN Hello, I'm an RN working at [facility] helping to expedite add on inpatient surgical cases today. I see this patient has been posted for surgery @ 1244 Is this patient currently NPO? Have they signed the consent form?   Are they alert and oriented to sign consent?  If you haven't already, please complete pre-procedure checklist and CHG bath, have the patient change into a clean hospital gown, remove all jewelry/belongings and have them brush teeth please.  The Short Stay RN will still reach out for report and provide a time they will come for the patient.

## 2023-06-24 NOTE — Progress Notes (Signed)
PROGRESS NOTE  Justin [name]  WUJ:[id number] DOB: 08-21-1933 DOA: 06/19/2023 PCP: Estevan Oaks, NP   Brief Narrative: Patient is a 89 year old male with history of diabetes type 2, GERD, hypertension, hyperlipidemia, coronary disease status post CABG, peripheral artery disease, history of prostate cancer, CKD stage IIIb who presented to the emergency room with complaint of worsening ulcer on his left great toe.  Over the past week, the patient observed drainage of pus and blood when the dressing was being changed, had foul smell.  No history of fever or chills.  On presentation he was hypertensive, afebrile.  Lab work showed creatinine of 1.76, baseline creatinine of 1.4-1.5.  Left foot x-ray showed increased subcutaneous air within the distal aspect of the great toe suspicious for early osteomyelitis.  Vascular surgery, orthopedics consulted.  Underwent left lower extremity angiogram on 9/23 with finding of small vessel disease not amenable to intervention.  Orthopedics planning for left first ray amputation today  Assessment & Plan:  Principal Problem:   Gangrene of toe of left foot (HCC) Active Problems:   Claudication in peripheral vascular disease (HCC)   Normocytic anemia   CAD s/p CABG in 2009   CKD stage 3 due to type 2 diabetes mellitus (HCC)   Diabetes mellitus without complication (HCC)   Asymptomatic bilateral carotid artery stenosis   Primary hypertension   GERD (gastroesophageal reflux disease)   HLD (hyperlipidemia)   Osteomyelitis of great toe of left foot (HCC)   Gangrene of left lower extremity: Patient presented with  complaint of worsening ulcer on his left great toe.  Over the past week, the patient observed drainage of pus and blood when the dressing was being changed, had foul smell.  X-rays suggestive of  of osteomyelitis.  Vascular surgery, orthopedics consulted.  Continue broad-spectrum antibiotics.    Underwent left lower extremity angiogram on 9/23 with  finding of small vessel disease not amenable to intervention.  Orthopedics planning for left first ray amputation today.  He has developed leukocytosis but is improving  Peripheral vascular disease: Follows with Dr Jacinto Halim, vascular surgery following here  Normocytic anemia: Likely from chronic medical conditions.  Hemoglobin stable.  Iron of 26.  Given IV iron  Coronary artery disease: Status post CABG, Anginal Symptoms.  On Lipitor, aspirin, Zetia, Zocor  CKD stage IIIb: Baseline creatinine 1.4-1.6.  Follows with nephrology as an outpatient.  Kidney function slightly above baseline.  Check BMP tomorrow  Diabetes type 2: A1c of 8.8.  Monitor blood sugars.  Continue sliding scale  insulin.  Bilateral carotid artery stenosis: Follows with cardiology  Hypertension: Monitor blood pressure.  On labetalol, Lasix, spironolactone, hydrochlorothiazide  Hyperlipidemia: Continue zokor, Zetia  GERD: Continue PPI  Hypokalemia: Supplement with potassium  Peripheral neuropathy: Complained of bilateral lower extremity discomfort.  Continue gabapentin, increased the dose to 200 mg 3 times a day with improvement        DVT prophylaxis:heparin injection 5,000 Units Start: 06/19/23 2200     Code Status: Full Code  Family Communication: Discussed with daughter at bedside on 9/25  Patient status:Inpatient  Patient is from :Home  Anticipated discharge NF:AOZH  Estimated DC date: After orthopedics  clearance   Consultants: vascular surgery  Procedures:None yet  Antimicrobials:  Anti-infectives (From admission, onward)    Start     Dose/Rate Route Frequency Ordered Stop   06/24/23 0600  ceFAZolin (ANCEF) IVPB 2g/100 mL premix        2 g 200 mL/hr over 30 Minutes Intravenous On  call to O.R. 06/23/23 1614 06/25/23 0559   06/23/23 1800  vancomycin (VANCOCIN) IVPB 1000 mg/200 mL premix        1,000 mg 200 mL/hr over 60 Minutes Intravenous Every 36 hours 06/22/23 1153     06/21/23 1000   vancomycin (VANCOREADY) IVPB 750 mg/150 mL  Status:  Discontinued        750 mg 150 mL/hr over 60 Minutes Intravenous Every 24 hours 06/20/23 0907 06/22/23 1153   06/20/23 1000  vancomycin (VANCOREADY) IVPB 500 mg/100 mL        500 mg 100 mL/hr over 60 Minutes Intravenous  Once 06/20/23 0907 06/20/23 1118   06/19/23 2000  metroNIDAZOLE (FLAGYL) IVPB 500 mg        500 mg 100 mL/hr over 60 Minutes Intravenous Every 12 hours 06/19/23 1805     06/19/23 1930  cefTRIAXone (ROCEPHIN) 2 g in sodium chloride 0.9 % 100 mL IVPB        2 g 200 mL/hr over 30 Minutes Intravenous Every 24 hours 06/19/23 1839     06/19/23 1900  cefTRIAXone (ROCEPHIN) 1 g in sodium chloride 0.9 % 100 mL IVPB  Status:  Discontinued        1 g 200 mL/hr over 30 Minutes Intravenous Every 24 hours 06/19/23 1805 06/19/23 1832   06/19/23 1630  vancomycin (VANCOCIN) IVPB 1000 mg/200 mL premix        1,000 mg 200 mL/hr over 60 Minutes Intravenous  Once 06/19/23 1615 06/19/23 1800       Subjective: Patient seen and examined at bedside today.  Hemodynamically stable.  Lying in bed.  Waiting for surgery.  Family at bedside.  No new complaints  Objective: Vitals:   06/23/23 2016 06/23/23 2320 06/24/23 0354 06/24/23 0747  BP: (!) 147/52 (!) 144/54 (!) 141/45 (!) 157/58  Pulse: 68 69 62   Resp: 20 20 20    Temp: 98 F (36.7 C) 98.3 F (36.8 C) 97.7 F (36.5 C) 98.1 F (36.7 C)  TempSrc: Oral Oral Oral Oral  SpO2: 100% 96% 92% 100%  Weight:      Height:        Intake/Output Summary (Last 24 hours) at 06/24/2023 1129 Last data filed at 06/24/2023 0300 Gross per 24 hour  Intake 1304.35 ml  Output --  Net 1304.35 ml    Filed Weights   06/19/23 1315  Weight: 71.2 kg    Examination:  General exam: Overall comfortable, not in distress, pleasant elderly male HEENT: PERRL Respiratory system:  no wheezes or crackles  Cardiovascular system: S1 & S2 heard, RRR.  Gastrointestinal system: Abdomen is nondistended, soft  and nontender. Central nervous system: Alert and oriented Extremities: No edema, no clubbing ,no cyanosis, gangrenous left toe Skin: No rashes, no ulcers,no icterus     Data Reviewed: I have personally reviewed following labs and imaging studies  CBC: Recent Labs  Lab 06/19/23 1319 06/20/23 0741 06/21/23 0854 06/23/23 0829 06/24/23 0630  WBC 11.1* 10.3 11.3* 16.4* 14.2*  NEUTROABS 8.7*  --   --   --   --   HGB 9.1* 9.0* 9.0* 8.4* 7.2*  HCT 28.4* 28.8* 27.3* 26.0* 22.4*  MCV 87.7 85.0 83.5 86.1 83.9  PLT 250 256 255 239 214   Basic Metabolic Panel: Recent Labs  Lab 06/20/23 0741 06/21/23 0854 06/22/23 0922 06/23/23 0829 06/24/23 0630  NA 135 136 138 136 134*  K 4.7 4.0 4.1 3.4* 3.4*  CL 107 108 108 111 103  CO2  17* 21* 16* 15* 23  GLUCOSE 199* 173* 179* 131* 162*  BUN 34* 32* 33* 35* 31*  CREATININE 1.56* 1.59* 1.81* 1.81* 1.83*  CALCIUM 9.0 8.9 9.1 7.5* 8.1*     Recent Results (from the past 240 hour(s))  Culture, blood (Routine X 2) w Reflex to ID Panel     Status: None (Preliminary result)   Collection Time: 06/20/23  8:55 AM   Specimen: BLOOD RIGHT ARM  Result Value Ref Range Status   Specimen Description BLOOD RIGHT ARM  Final   Special Requests   Final    BOTTLES DRAWN AEROBIC AND ANAEROBIC Blood Culture adequate volume   Culture   Final    NO GROWTH 4 DAYS Performed at [facility], [address] [place], [place] [address]    Report Status PENDING  Incomplete  Culture, blood (Routine X 2) w Reflex to ID Panel     Status: None (Preliminary result)   Collection Time: 06/20/23  8:57 AM   Specimen: BLOOD RIGHT ARM  Result Value Ref Range Status   Specimen Description BLOOD RIGHT ARM  Final   Special Requests   Final    BOTTLES DRAWN AEROBIC AND ANAEROBIC Blood Culture adequate volume   Culture   Final    NO GROWTH 4 DAYS Performed at [facility], [address], [place], [place] [address]    Report Status PENDING  Incomplete   Surgical pcr screen     Status: None   Collection Time: 06/23/23  6:26 PM   Specimen: Nasal Mucosa; Nasal Swab  Result Value Ref Range Status   MRSA, PCR NEGATIVE NEGATIVE Final   Staphylococcus aureus NEGATIVE NEGATIVE Final    Comment: (NOTE) The Xpert SA Assay (FDA approved for NASAL specimens in patients 40 years of age and older), is one component of a comprehensive surveillance program. It is not intended to diagnose infection nor to guide or monitor treatment. Performed at [facility], [address], [place], [place] [address]      Radiology Studies: PERIPHERAL VASCULAR CATHETERIZATION  Result Date: 06/22/2023 Images from the original result were not included. Patient name: Justin [name] MRN: [id number] DOB: May 12, 1933 Sex: male 06/22/2023 Pre-operative Diagnosis: Atherosclerosis native arteries with left great toe gangrene Post-operative diagnosis:  Same Surgeon:  Luanna Salk. Randie Heinz, MD Procedure Performed: 1.  Percutaneous ultrasound guided access and Mynx device closure right common femoral artery 2.  Catheter aorta with aortogram and bilateral lower extremity angiography 3.  Selection with catheter of left popliteal artery Indications: 89 year old male with history of peripheral arterial disease now with left great toe gangrene likely will require amputation with monophasic ABIs indicated for angiography. Findings: The aorta and iliac segments are free of flow-limiting stenosis although they are calcified.  Bilateral common femoral arteries and proximal SFAs are calcified.  On the left side the SFA is heavily calcified throughout without flow-limiting stenosis in the popliteal artery also similarly without stenosis.  Posterior tibial artery has a high takeoff there is no frank tibioperoneal trunk but the posterior tibial artery occludes after the takeoff and is quite diminutive.  The anterior tibial artery is the dominant runoff to the level of the ankle where then has stenosis and  does flow onto the foot although there is no distal flow.  I elected for no intervention given that this appears to be small vessel and outflow issue of the left foot.  Procedure:  The patient was identified in the holding  area and taken to room 8.  The patient was then placed supine on the table and prepped and draped in the usual sterile fashion.  A time out was called.  Ultrasound was used to evaluate the right common femoral artery which was calcified although there was no luminal stenosis.  The area was anesthetized with 1% lidocaine and cannulated with a micropuncture needle followed by wire and a sheath.  An ultrasound image was saved the permanent record.  We placed a Bentson wire followed by 5 [place] sheath and Omni catheter to the level of L1 where CO2 aortogram was performed and the catheter was pulled to the bifurcation bilateral lower extremity angiography was performed.  We then crossed the bifurcation with Bentson wire and Omni catheter placed this in the left common femoral artery and perform left lower extremity angiography.  We then placed a straight catheter over a wire all the way to the popliteal artery at the level of the knee and perform contrast angiography below the knee.  With the above findings we elected no intervention the catheter was removed and a minx device was deployed.  He tolerated the procedure without any complication. Contrast: 15 cc Brandon C. Randie Heinz, MD [facility] Office: [phone] Pager: [phone]   Scheduled Meds:  aspirin  81 mg Oral Daily   DULoxetine  60 mg Oral Daily   ezetimibe  10 mg Oral QPM   furosemide  40 mg Oral Daily   gabapentin  200 mg Oral TID   heparin injection (subcutaneous)  5,000 Units Subcutaneous Q8H   influenza vaccine adjuvanted  0.5 mL Intramuscular Tomorrow-1000   insulin aspart  0-9 Units Subcutaneous TID WC   insulin glargine-yfgn  10 Units Subcutaneous Daily   labetalol  100 mg Oral BID    pantoprazole  80 mg Oral Daily   pneumococcal 20-valent conjugate vaccine  0.5 mL Intramuscular Tomorrow-1000   polyethylene glycol  17 g Oral Daily   povidone-iodine  2 Application Topical Once   ropinirole  5 mg Oral QHS   senna  1 tablet Oral BID   simvastatin  20 mg Oral QHS   sodium chloride flush  3 mL Intravenous Q12H   tamsulosin  0.4 mg Oral QPC supper   Continuous Infusions:  sodium chloride     sodium chloride 75 mL/hr at 06/24/23 0958    ceFAZolin (ANCEF) IV     cefTRIAXone (ROCEPHIN)  IV Stopped (06/23/23 1744)   metronidazole 500 mg (06/24/23 0956)   potassium chloride     vancomycin Stopped (06/23/23 2130)     LOS: 5 days   Burnadette Pop, MD [facility] P9/25/2024, 11:29 AM

## 2023-06-24 NOTE — Anesthesia Procedure Notes (Signed)
Anesthesia Regional Block: Popliteal block   Pre-Anesthetic Checklist: , timeout performed,  Correct Patient, Correct Site, Correct Laterality,  Correct Procedure, Correct Position, site marked,  Risks and benefits discussed,  Surgical consent,  Pre-op evaluation,  At surgeon's request and post-op pain management  Laterality: Left and Lower  Prep: chloraprep       Needles:  Injection technique: Single-shot  Needle Type: Echogenic Needle     Needle Length: 9cm  Needle Gauge: 21     Additional Needles:   Procedures:,,,, ultrasound used (permanent image in chart),,    Narrative:  Start time: 06/24/2023 2:10 PM End time: 06/24/2023 2:18 PM Injection made incrementally with aspirations every 5 mL.  Performed by: Personally  Anesthesiologist: Jairo Ben, MD  Additional Notes: Pt identified in Holding room.  Monitors applied. Working IV access confirmed. Timeout, Sterile prep L lateral distal thigh.  #21ga ECHOgenic Arrow block needle to sciatic nerve at split with US guidance, confirmed by nerve stim at 0.4 MA .  13cc 0.5% Bupivacaine with 7 cc Exparel injected incrementally after negative test dose.  Patient asymptomatic, VSS, no heme aspirated, tolerated well.   Sandford Craze, MD

## 2023-06-24 NOTE — Op Note (Signed)
06/24/2023  3:40 PM  PATIENT:  Justin [name]    PRE-OPERATIVE DIAGNOSIS:  Gangrene Left Great Toe  POST-OPERATIVE DIAGNOSIS:  Same  PROCEDURE:  LEFT BELOW THE KNEE AMPUTATION Application of Kerecis micro graft 38 cm  Application of Prevena customizable and Prevena arthroform wound VAC dressings Application of Vive Wear stump shrinker and the Hanger limb protector  SURGEON:  Nadara Mustard, MD  ANESTHESIA:   General  PREOPERATIVE INDICATIONS:  CRISTOVAL SHINES is a  89 y.o. male with a diagnosis of Gangrene Left Great Toe who failed conservative measures and elected for surgical management.    The risks benefits and alternatives were discussed with the patient preoperatively including but not limited to the risks of infection, bleeding, nerve injury, cardiopulmonary complications, the need for revision surgery, among others, and the patient was willing to proceed.  OPERATIVE IMPLANTS:   Implant Name Type Inv. Item Serial No. Manufacturer Lot No. LRB No. Used Action  GRAFT SKIN WND MICRO 38 - [id number] Tissue GRAFT SKIN WND MICRO 38  [employer] [id number] Left 1 Implanted     OPERATIVE FINDINGS: calcified arteries, muscle with poor consistency  OPERATIVE PROCEDURE: Patient was brought to the operating room after undergoing a regional anesthetic.  After adequate levels anesthesia were obtained a thigh tourniquet was placed and the lower extremity was prepped using DuraPrep draped into a sterile field. The foot was draped out of the sterile field with impervious stockinette.  A timeout was called and the tourniquet inflated.  A transverse skin incision was made 12 cm distal to the tibial tubercle, the incision curved proximally, and a large posterior flap was created.  The tibia was transected just proximal to the skin incision and beveled anteriorly.  The fibula was transected just proximal to the tibial incision.  The sciatic nerve was pulled cut and allowed to retract.  The  vascular bundles were suture ligated with 2-0 silk.  The tourniquet was deflated and hemostasis obtained.      The Kerecis micro powder 38 cm was applied to the open wound that has a 200 cm surface area.  The deep and superficial fascial layers were closed using #1 Vicryl.  The skin was closed using staples.    The Prevena customizable dressing was applied this was overwrapped with the arthroform sponge.  Collier Flowers was used to secure the sponges and the circumferential compression was secured to the skin with Dermatac.  This was connected to the wound VAC pump and had a good suction fit this was covered with a stump shrinker and a limb protector.  Patient was taken to the PACU in stable condition.   DISCHARGE PLANNING:  Antibiotic duration: 24-hour antibiotics  Weightbearing: Nonweightbearing on the operative extremity  Pain medication: Opioid pathway  Dressing care/ Wound VAC: Continue wound VAC with the Prevena plus pump at discharge for 1 week  Ambulatory devices: Walker or kneeling scooter  Discharge to: Discharge planning based on recommendations per physical therapy  Follow-up: In the office 1 week after discharge.

## 2023-06-24 NOTE — Progress Notes (Signed)
   06/24/23 1032  TOC Brief Assessment  Insurance and Status Reviewed  Patient has primary care physician Yes  Home environment has been reviewed from home w family  Prior level of function: support from family  Prior/Current Home Services No current home services  Social Determinants of Health Reivew SDOH reviewed no interventions necessary  Transition of care needs no transition of care needs at this time   Spoke w patient and family at bedside. Currently anticipate DC to home after surgery. Left great toe amputation planned for today. Patient has cane, RW, and WC at home per spouse.  TOC will continue to follow for post op needs.

## 2023-06-24 NOTE — Interval H&P Note (Signed)
History and Physical Interval Note:  06/24/2023 6:52 AM  Justin [name]  has presented today for surgery, with the diagnosis of Gangrene Left Great Toe.  The various methods of treatment have been discussed with the patient and family. After consideration of risks, benefits and other options for treatment, the patient has consented to  Procedure(s): LEFT FOOT 1ST RAY AMPUTATION (Left) as a surgical intervention.  The patient's history has been reviewed, patient examined, no change in status, stable for surgery.  I have reviewed the patient's chart and labs.  Questions were answered to the patient's satisfaction.     Nadara Mustard

## 2023-06-24 NOTE — Progress Notes (Signed)
Pt received back on 4E16 from PACU.  Resting comfortably s/p L BKA.  Wound vac in place.

## 2023-06-25 ENCOUNTER — Encounter (HOSPITAL_COMMUNITY): Payer: Self-pay | Admitting: Orthopedic Surgery

## 2023-06-25 DIAGNOSIS — I96 Gangrene, not elsewhere classified: Secondary | ICD-10-CM | POA: Diagnosis not present

## 2023-06-25 LAB — CBC
HCT: 22.3 % — ABNORMAL LOW (ref 39.0–52.0)
Hemoglobin: 7.2 g/dL — ABNORMAL LOW (ref 13.0–17.0)
MCH: 27.2 pg (ref 26.0–34.0)
MCHC: 32.3 g/dL (ref 30.0–36.0)
MCV: 84.2 fL (ref 80.0–100.0)
Platelets: 219 10*3/uL (ref 150–400)
RBC: 2.65 MIL/uL — ABNORMAL LOW (ref 4.22–5.81)
RDW: 13.9 % (ref 11.5–15.5)
WBC: 16.5 10*3/uL — ABNORMAL HIGH (ref 4.0–10.5)
nRBC: 0 % (ref 0.0–0.2)

## 2023-06-25 LAB — BASIC METABOLIC PANEL
Anion gap: 8 (ref 5–15)
BUN: 30 mg/dL — ABNORMAL HIGH (ref 8–23)
CO2: 20 mmol/L — ABNORMAL LOW (ref 22–32)
Calcium: 8.1 mg/dL — ABNORMAL LOW (ref 8.9–10.3)
Chloride: 105 mmol/L (ref 98–111)
Creatinine, Ser: 1.61 mg/dL — ABNORMAL HIGH (ref 0.61–1.24)
GFR, Estimated: 41 mL/min — ABNORMAL LOW (ref >60)
Glucose, Bld: 263 mg/dL — ABNORMAL HIGH (ref 70–99)
Potassium: 4.5 mmol/L (ref 3.5–5.1)
Sodium: 133 mmol/L — ABNORMAL LOW (ref 135–145)

## 2023-06-25 LAB — CULTURE, BLOOD (ROUTINE X 2)
Culture: NO GROWTH
Culture: NO GROWTH
Special Requests: ADEQUATE
Special Requests: ADEQUATE

## 2023-06-25 LAB — PREPARE RBC (CROSSMATCH)

## 2023-06-25 LAB — GLUCOSE, CAPILLARY
Glucose-Capillary: 225 mg/dL — ABNORMAL HIGH (ref 70–99)
Glucose-Capillary: 178 mg/dL — ABNORMAL HIGH (ref 70–99)
Glucose-Capillary: 201 mg/dL — ABNORMAL HIGH (ref 70–99)
Glucose-Capillary: 166 mg/dL — ABNORMAL HIGH (ref 70–99)

## 2023-06-25 LAB — TYPE AND SCREEN
ABO/RH(D): B POS
Antibody Screen: NEGATIVE
Unit division: 0

## 2023-06-25 LAB — BPAM RBC
Blood Product Expiration Date: 202410302359
ISSUE DATE / TIME: 202409261231
Unit Type and Rh: 7300

## 2023-06-25 MED ORDER — SODIUM BICARBONATE 650 MG PO TABS
650.0000 mg | ORAL_TABLET | Freq: Two times a day (BID) | ORAL | Status: DC
  Administered 2023-06-25 – 2023-07-01 (×13): 650 mg via ORAL
  Filled 2023-06-25 (×13): qty 1

## 2023-06-25 MED ORDER — SODIUM CHLORIDE 0.9% IV SOLUTION: Freq: Once | INTRAVENOUS | Status: AC

## 2023-06-25 NOTE — TOC Progression Note (Signed)
Transition of Care [facility]) - Progression Note    Patient Details  Name: Justin [name] MRN: [id number] Date of Birth: [birth year]  [facility] of [facility]) CM/SW Contact  Lockie Pares, RN Phone Number: 06/25/2023, 1:26 PM  Clinical Narrative:    Patient ended up having a BKA with provena wound vac. Rehabilitative services has recommended SNF. Messaged with CSW to discuss.         Expected Discharge Plan and Services                                               Social Determinants of Health (SDOH) Interventions SDOH Screenings   Food Insecurity: No Food Insecurity (06/20/2023)  Housing: Low Risk  (06/20/2023)  Transportation Needs: No Transportation Needs (06/20/2023)  Utilities: Not At Risk (06/20/2023)  Social Connections: Unknown (02/11/2022)   Received from [facility]  Tobacco Use: Medium Risk (06/24/2023)    Readmission Risk Interventions     No data to display

## 2023-06-25 NOTE — Progress Notes (Signed)
Patient ID: Justin [name], male   DOB: May 19, 1933, 89 y.o.   MRN: [id number] Patient is postoperative day 1 transtibial amputation on the left.  There is no drainage in the wound VAC canister there is a good suction fit.  Patient states that he no longer has pain in the foot.  Plan for discharge to skilled nursing.

## 2023-06-25 NOTE — Progress Notes (Signed)
PROGRESS NOTE  Justin [name]  EXB:[id number] DOB: 03/28/1933 DOA: 06/19/2023 PCP: Estevan Oaks, NP   Brief Narrative: Patient is a 89 year old male with history of diabetes type 2, GERD, hypertension, hyperlipidemia, coronary disease status post CABG, peripheral artery disease, history of prostate cancer, CKD stage IIIb who presented to the emergency room with complaint of worsening ulcer on his left great toe.  Over the past week, the patient observed drainage of pus and blood when the dressing was being changed, had foul smell.  No history of fever or chills.  On presentation he was hypertensive, afebrile.  Lab work showed creatinine of 1.76, baseline creatinine of 1.4-1.5.  Left foot x-ray showed increased subcutaneous air within the distal aspect of the great toe suspicious for early osteomyelitis.  Vascular surgery, orthopedics consulted.  Underwent left lower extremity angiogram on 9/23 with finding of small vessel disease not amenable to intervention.  S/P transtibial amputation of the left on 9/25.  PT/OT pending.  Assessment & Plan:  Principal Problem:   Gangrene of toe of left foot (HCC) Active Problems:   Claudication in peripheral vascular disease (HCC)   Normocytic anemia   CAD s/p CABG in 2009   CKD stage 3 due to type 2 diabetes mellitus (HCC)   Diabetes mellitus without complication (HCC)   Asymptomatic bilateral carotid artery stenosis   Primary hypertension   GERD (gastroesophageal reflux disease)   HLD (hyperlipidemia)   Osteomyelitis of great toe of left foot (HCC)   Gangrene of left lower extremity: Patient presented with  complaint of worsening ulcer on his left great toe.  Over the past week, the patient observed drainage of pus and blood when the dressing was being changed, had foul smell.  X-rays suggestive of  of osteomyelitis.  Vascular surgery, orthopedics consulted.    Underwent left lower extremity angiogram on 9/23 with finding of small vessel disease not  amenable to intervention.  Orthopedics did transtibial amputation on 9/25, PT/OT pending.  Expect discharge to SNF Antibiotics will be continued for 24 hours postop.  Develop mild leukocytosis today, likely reactive  Peripheral vascular disease: Follows with Dr Jacinto Halim, vascular surgery following here  Normocytic anemia: Likely from chronic medical conditions.    Iron of 26.  Given IV iron.  Hemoglobin of 7.2.  No evidence of acute blood loss except for surgery.  Transfused with  a unit of PRBC  Coronary artery disease: Status post CABG, Anginal Symptoms.  On Lipitor, aspirin, Zetia, Zocor  CKD stage IIIb/NAGMA: Baseline creatinine 1.4-1.6.  Follows with nephrology as an outpatient.  Kidney function slightly above baseline.  Check BMP tomorrow.  Started sodium bicarb tablets  Diabetes type 2: A1c of 8.8.  Monitor blood sugars.  Continue sliding scale  insulin.  Bilateral carotid artery stenosis: Follows with cardiology  Hypertension: Monitor blood pressure.  On labetalol, Lasix, spironolactone, hydrochlorothiazide at home  Hyperlipidemia: Continue zokor, Zetia  GERD: Continue PPI  Peripheral neuropathy: Complained of bilateral lower extremity discomfort.  Continue gabapentin, increased the dose to 200 mg 3 times a day with improvement        DVT prophylaxis:SCD's Start: 06/24/23 1619 heparin injection 5,000 Units Start: 06/19/23 2200     Code Status: Full Code  Family Communication: Discussed with daughter on phone on 9/26  Patient status:Inpatient  Patient is from :Home  Anticipated discharge to:SNF  Estimated DC date: 2-3 days   Consultants: vascular surgery, orthopedics  Procedures: Left transtibial amputation  Antimicrobials:  Anti-infectives (From admission, onward)  Start     Dose/Rate Route Frequency Ordered Stop   06/24/23 1715  ceFAZolin (ANCEF) IVPB 2g/100 mL premix  Status:  Discontinued        2 g 200 mL/hr over 30 Minutes Intravenous Every 8 hours  06/24/23 1619 06/24/23 1638   06/24/23 0600  ceFAZolin (ANCEF) IVPB 2g/100 mL premix        2 g 200 mL/hr over 30 Minutes Intravenous On call to O.R. 06/23/23 1614 06/24/23 1510   06/23/23 1800  vancomycin (VANCOCIN) IVPB 1000 mg/200 mL premix        1,000 mg 200 mL/hr over 60 Minutes Intravenous Every 36 hours 06/22/23 1153     06/21/23 1000  vancomycin (VANCOREADY) IVPB 750 mg/150 mL  Status:  Discontinued        750 mg 150 mL/hr over 60 Minutes Intravenous Every 24 hours 06/20/23 0907 06/22/23 1153   06/20/23 1000  vancomycin (VANCOREADY) IVPB 500 mg/100 mL        500 mg 100 mL/hr over 60 Minutes Intravenous  Once 06/20/23 0907 06/20/23 1118   06/19/23 2000  metroNIDAZOLE (FLAGYL) IVPB 500 mg        500 mg 100 mL/hr over 60 Minutes Intravenous Every 12 hours 06/19/23 1805     06/19/23 1930  cefTRIAXone (ROCEPHIN) 2 g in sodium chloride 0.9 % 100 mL IVPB        2 g 200 mL/hr over 30 Minutes Intravenous Every 24 hours 06/19/23 1839     06/19/23 1900  cefTRIAXone (ROCEPHIN) 1 g in sodium chloride 0.9 % 100 mL IVPB  Status:  Discontinued        1 g 200 mL/hr over 30 Minutes Intravenous Every 24 hours 06/19/23 1805 06/19/23 1832   06/19/23 1630  vancomycin (VANCOCIN) IVPB 1000 mg/200 mL premix        1,000 mg 200 mL/hr over 60 Minutes Intravenous  Once 06/19/23 1615 06/19/23 1800       Subjective: Patient seen and examined at bedside today.  Appears comfortable.  He has a wound VAC on the left lower extremity.  Appears without any distress, no pain complaint  Objective: Vitals:   06/24/23 2022 06/24/23 2344 06/25/23 0333 06/25/23 0759  BP: (!) 160/61 130/72 (!) 143/63 (!) 126/91  Pulse: 88 77 75 64  Resp: 20 20 20  (!) 22  Temp: 98.6 F (37 C) 99.1 F (37.3 C) 98.8 F (37.1 C) 99.3 F (37.4 C)  TempSrc: Oral Oral Oral Oral  SpO2: 97% 92% 93% 100%  Weight:      Height:        Intake/Output Summary (Last 24 hours) at 06/25/2023 1124 Last data filed at 06/25/2023 5284 Gross  per 24 hour  Intake 1250 ml  Output 250 ml  Net 1000 ml    Filed Weights   06/19/23 1315  Weight: 71.2 kg    Examination:  General exam: Overall comfortable, not in distress, pleasant elderly gentleman HEENT: PERRL Respiratory system:  no wheezes or crackles  Cardiovascular system: S1 & S2 heard, RRR.  Gastrointestinal system: Abdomen is nondistended, soft and nontender. Central nervous system: Alert and oriented Extremities: Wound VAC on the left lower extremity Skin: No rashes, no ulcers,no icterus      Data Reviewed: I have personally reviewed following labs and imaging studies  CBC: Recent Labs  Lab 06/19/23 1319 06/20/23 0741 06/21/23 0854 06/23/23 0829 06/24/23 0630 06/25/23 0347  WBC 11.1* 10.3 11.3* 16.4* 14.2* 16.5*  NEUTROABS 8.7*  --   --   --   --   --  HGB 9.1* 9.0* 9.0* 8.4* 7.2* 7.2*  HCT 28.4* 28.8* 27.3* 26.0* 22.4* 22.3*  MCV 87.7 85.0 83.5 86.1 83.9 84.2  PLT 250 256 255 239 214 219   Basic Metabolic Panel: Recent Labs  Lab 06/21/23 0854 06/22/23 0922 06/23/23 0829 06/24/23 0630 06/25/23 0347  NA 136 138 136 134* 133*  K 4.0 4.1 3.4* 3.4* 4.5  CL 108 108 111 103 105  CO2 21* 16* 15* 23 20*  GLUCOSE 173* 179* 131* 162* 263*  BUN 32* 33* 35* 31* 30*  CREATININE 1.59* 1.81* 1.81* 1.83* 1.61*  CALCIUM 8.9 9.1 7.5* 8.1* 8.1*     Recent Results (from the past 240 hour(s))  Culture, blood (Routine X 2) w Reflex to ID Panel     Status: None   Collection Time: 06/20/23  8:55 AM   Specimen: BLOOD RIGHT ARM  Result Value Ref Range Status   Specimen Description BLOOD RIGHT ARM  Final   Special Requests   Final    BOTTLES DRAWN AEROBIC AND ANAEROBIC Blood Culture adequate volume   Culture   Final    NO GROWTH 5 DAYS Performed at [facility], [address], [place], [place] [address]    Report Status 06/25/2023 FINAL  Final  Culture, blood (Routine X 2) w Reflex to ID Panel     Status: None   Collection Time: 06/20/23  8:57 AM    Specimen: BLOOD RIGHT ARM  Result Value Ref Range Status   Specimen Description BLOOD RIGHT ARM  Final   Special Requests   Final    BOTTLES DRAWN AEROBIC AND ANAEROBIC Blood Culture adequate volume   Culture   Final    NO GROWTH 5 DAYS Performed at [facility], [address] [place], [place] [address]    Report Status 06/25/2023 FINAL  Final  Surgical pcr screen     Status: None   Collection Time: 06/23/23  6:26 PM   Specimen: Nasal Mucosa; Nasal Swab  Result Value Ref Range Status   MRSA, PCR NEGATIVE NEGATIVE Final   Staphylococcus aureus NEGATIVE NEGATIVE Final    Comment: (NOTE) The Xpert SA Assay (FDA approved for NASAL specimens in patients 39 years of age and older), is one component of a comprehensive surveillance program. It is not intended to diagnose infection nor to guide or monitor treatment. Performed at [facility], [address] [place], [place] [address]      Radiology Studies: No results found.  Scheduled Meds:  sodium chloride   Intravenous Once   vitamin C  1,000 mg Oral Daily   aspirin  81 mg Oral Daily   docusate sodium  100 mg Oral Daily   DULoxetine  60 mg Oral Daily   ezetimibe  10 mg Oral QPM   furosemide  40 mg Oral Daily   gabapentin  200 mg Oral TID   heparin injection (subcutaneous)  5,000 Units Subcutaneous Q8H   influenza vaccine adjuvanted  0.5 mL Intramuscular Tomorrow-1000   insulin aspart  0-9 Units Subcutaneous TID WC   insulin glargine-yfgn  10 Units Subcutaneous Daily   labetalol  100 mg Oral BID   nutrition supplement (JUVEN)  1 packet Oral BID BM   pantoprazole  80 mg Oral Daily   pneumococcal 20-valent conjugate vaccine  0.5 mL Intramuscular Tomorrow-1000   polyethylene glycol  17 g Oral Daily   ropinirole  5 mg Oral QHS   senna  1 tablet Oral BID  simvastatin  20 mg Oral QHS   sodium chloride flush  3 mL Intravenous Q12H   tamsulosin  0.4 mg Oral QPC supper   zinc sulfate  220 mg Oral Daily    Continuous Infusions:  sodium chloride     sodium chloride     cefTRIAXone (ROCEPHIN)  IV Stopped (06/23/23 1744)   magnesium sulfate bolus IVPB     metronidazole 500 mg (06/25/23 1012)   vancomycin 1,000 mg (06/25/23 0620)     LOS: 6 days   Burnadette Pop, MD [facility] P9/26/2024, 11:24 AM

## 2023-06-25 NOTE — Anesthesia Postprocedure Evaluation (Signed)
Anesthesia Post Note  Patient: Justin [name]  Procedure(s) Performed: LEFT BELOW THE KNEE AMPUTATION (Left: Leg Lower)     Patient location during evaluation: PACU Anesthesia Type: General Level of consciousness: awake and alert Pain management: pain level controlled Vital Signs Assessment: post-procedure vital signs reviewed and stable Respiratory status: spontaneous breathing, nonlabored ventilation, respiratory function stable and patient connected to nasal cannula oxygen Cardiovascular status: blood pressure returned to baseline and stable Postop Assessment: no apparent nausea or vomiting Anesthetic complications: no   No notable events documented.  Last Vitals:  Vitals:   06/25/23 0333 06/25/23 0759  BP: (!) 143/63 (!) 126/91  Pulse: 75 64  Resp: 20 (!) 22  Temp: 37.1 C 37.4 C  SpO2: 93% 100%    Last Pain:  Vitals:   06/25/23 0759  TempSrc: Oral  PainSc:                  Justin [name]

## 2023-06-25 NOTE — Evaluation (Addendum)
Occupational Therapy Evaluation Patient Details Name: Justin [name] MRN: [id number] DOB: Dec 24, 1932 Today's Date: 06/25/2023   History of Present Illness 89 year old male with history of diabetes type 2, GERD, hypertension, hyperlipidemia, coronary disease status post CABG, peripheral artery disease, history of prostate cancer, CKD stage IIIb who presented to the emergency room with complaint of worsening ulcer on his left great toe.  S/p L BKA   Clinical Impression   Patient admitted for the procedure above.  PTA he lives with his daughter and was receiving hospice services.  Patient typically used his wheelchair for mobility and has assist for showers.  Currently he is needing up to Max A for transfers and Mod A for lower body ADL from a sitting position.  OT to continue efforts in the acute setting and Patient will benefit from intensive inpatient follow up therapy, >3 hours/day         If plan is discharge home, recommend the following: A lot of help with walking and/or transfers;A lot of help with bathing/dressing/bathroom;Assist for transportation    Functional Status Assessment  Patient has had a recent decline in their functional status and demonstrates the ability to make significant improvements in function in a reasonable and predictable amount of time.  Equipment Recommendations  None recommended by OT    Recommendations for Other Services       Precautions / Restrictions Precautions Precautions: Fall Required Braces or Orthoses: Other Brace Other Brace: limb protector Restrictions Weight Bearing Restrictions: Yes LLE Weight Bearing: Non weight bearing      Mobility Bed Mobility Overal bed mobility: Needs Assistance Bed Mobility: Supine to Sit     Supine to sit: Min assist          Transfers Overall transfer level: Needs assistance   Transfers: Sit to/from Stand, Bed to chair/wheelchair/BSC Sit to Stand: Mod assist Stand pivot transfers: Max assist                 Balance Overall balance assessment: Needs assistance Sitting-balance support: Feet supported Sitting balance-Leahy Scale: Fair     Standing balance support: Reliant on assistive device for balance, Bilateral upper extremity supported Standing balance-Leahy Scale: Poor                             ADL either performed or assessed with clinical judgement   ADL Overall ADL's : Needs assistance/impaired Eating/Feeding: Set up;Sitting   Grooming: Wash/dry hands;Wash/dry face;Contact guard assist;Sitting   Upper Body Bathing: Minimal assistance;Sitting   Lower Body Bathing: Moderate assistance;Sitting/lateral leans   Upper Body Dressing : Minimal assistance;Sitting   Lower Body Dressing: Moderate assistance;Sitting/lateral leans   Toilet Transfer: Maximal assistance;Stand-pivot;Rolling walker (2 wheels);BSC/3in1   Toileting- Clothing Manipulation and Hygiene: Total assistance;Sit to/from stand               Vision Baseline Vision/History: 0 No visual deficits;1 Wears glasses Patient Visual Report: No change from baseline       Perception Perception: Within Functional Limits       Praxis Praxis: WFL       Pertinent Vitals/Pain Pain Assessment Pain Assessment: No/denies pain     Extremity/Trunk Assessment Upper Extremity Assessment Upper Extremity Assessment: Overall WFL for tasks assessed   Lower Extremity Assessment Lower Extremity Assessment: Defer to PT evaluation       Communication Communication Communication: No apparent difficulties   Cognition Arousal: Alert Behavior During Therapy: WFL for tasks assessed/performed Overall Cognitive Status:  Within Functional Limits for tasks assessed                                       General Comments   VSS on RA    Exercises     Shoulder Instructions      Home Living Family/patient expects to be discharged to:: Private residence Living Arrangements:  Children Available Help at Discharge: Family;Available 24 hours/day Type of Home: House Home Access: Stairs to enter [employer] of Steps: 5 Entrance Stairs-Rails: Left;Right Home Layout: One level     Bathroom Shower/Tub: Chief Strategy Officer: Standard Bathroom Accessibility: Yes   Home Equipment: Agricultural consultant (2 wheels);Cane - single point;Shower seat;Wheelchair - manual          Prior Functioning/Environment Prior Level of Function : Needs assist             Mobility Comments: pivots to wheelchair ADLs Comments: assist with showers from hospice aide 3x/wk        OT Problem List: Decreased activity tolerance;Impaired balance (sitting and/or standing);Decreased strength      OT Treatment/Interventions: Self-care/ADL training;Therapeutic activities;Patient/family education;Balance training;DME and/or AE instruction    OT Goals(Current goals can be found in the care plan section) Acute Rehab OT Goals Patient Stated Goal: Return home OT Goal Formulation: With patient Time For Goal Achievement: 07/09/23 Potential to Achieve Goals: Good ADL Goals Pt Will Perform Grooming: with set-up;sitting Pt Will Perform Lower Body Dressing: with set-up;sitting/lateral leans Pt Will Transfer to Toilet: with min assist;stand pivot transfer;bedside commode  OT Frequency: Min 1X/week    Co-evaluation              AM-PAC OT "6 Clicks" Daily Activity     Outcome Measure Help from another person eating meals?: None Help from another person taking care of personal grooming?: A Little Help from another person toileting, which includes using toliet, bedpan, or urinal?: A Lot Help from another person bathing (including washing, rinsing, drying)?: A Lot Help from another person to put on and taking off regular upper body clothing?: A Little Help from another person to put on and taking off regular lower body clothing?: A Lot 6 Click Score: 16   End of  Session Equipment Utilized During Treatment: Gait belt;Rolling walker (2 wheels) Nurse Communication: Mobility status  Activity Tolerance: Patient tolerated treatment well Patient left: in chair;with call bell/phone within reach;with family/visitor present  OT Visit Diagnosis: Unsteadiness on feet (R26.81);Muscle weakness (generalized) (M62.81)                Time: 8413-2440 OT Time Calculation (min): 22 min Charges:  OT General Charges $OT Visit: 1 Visit OT Evaluation $OT Eval Moderate Complexity: 1 Mod  06/25/2023  RP, OTR/L  Acute Rehabilitation Services  Office:  [phone]   Suzanna Obey 06/25/2023, 12:27 PM

## 2023-06-25 NOTE — Evaluation (Signed)
Physical Therapy Evaluation Patient Details Name: Justin [name] MRN: [id number] DOB: [birth year] Today's Date: 06/25/2023  History of Present Illness  89 year old male admitted 06/19/23 with complaint of worsening ulcer on his left great toe.  Underwent aortogram and bilateral lower extremity angiography 06/22/23 and  L BKA on 06/24/23.  PMH positive for diabetes type 2, GERD, hypertension, hyperlipidemia, coronary disease status post CABG, peripheral artery disease, history of prostate cancer, CKD stage IIIb who presented to the emergency room with complaint of worsening ulcer on his left great toe.  Clinical Impression  Patient presents with decreased mobility due to generalized weakness, decreased balance, decreased activity tolerance and decreased safety awareness.  Currently min to mod A +2 for safety for mobility up to [facility] and back to bed.  Patient previously managing at wheelchair level at home.  PT will continue to follow, appropriate for intensive inpatient rehab prior to d/c home.       If plan is discharge home, recommend the following:     Can travel by private vehicle        Equipment Recommendations None recommended by PT  Recommendations for Other Services  Rehab consult    Functional Status Assessment Patient has had a recent decline in their functional status and demonstrates the ability to make significant improvements in function in a reasonable and predictable amount of time.     Precautions / Restrictions Precautions Precautions: Fall Required Braces or Orthoses: Other Brace Other Brace: limb protector Restrictions Weight Bearing Restrictions: Yes LLE Weight Bearing: Non weight bearing      Mobility  Bed Mobility Overal bed mobility: Needs Assistance Bed Mobility: Sit to Supine       Sit to supine: Contact guard assist   General bed mobility comments: for positioning with cues    Transfers Overall transfer level: Needs assistance Equipment used:  Rolling walker (2 wheels) Transfers: Sit to/from Stand, Bed to chair/wheelchair/BSC Sit to Stand: Mod assist, +2 safety/equipment Stand pivot transfers: Mod assist, +2 safety/equipment         General transfer comment: stood from EOB after NT assisted to bed from recliner, noted incontinent of stool so retrieved BSC and pt stood with +2 A for safety to RW then step/hop pivot to [facility] initially with L foot forward needing help to block to foot for safety with sit to stand    Ambulation/Gait               General Gait Details: limited to bedside only due to fatigue  Stairs            Wheelchair Mobility     Tilt Bed    Modified Rankin (Stroke Patients Only)       Balance Overall balance assessment: Needs assistance Sitting-balance support: Feet supported Sitting balance-Leahy Scale: Fair     Standing balance support: Reliant on assistive device for balance, Bilateral upper extremity supported Standing balance-Leahy Scale: Poor Standing balance comment: UE support and min A in standing while A for  hygiene after toileting                             Pertinent Vitals/Pain Pain Assessment Pain Assessment: No/denies pain    Home Living Family/patient expects to be discharged to:: Private residence Living Arrangements: Children Available Help at Discharge: Family;Available 24 hours/day Type of Home: House Home Access: Stairs to enter Entrance Stairs-Rails: [employer] of Steps: 5   Home Layout: One level  Home Equipment: Agricultural consultant (2 wheels);Cane - single point;Shower seat;Wheelchair - manual      Prior Function Prior Level of Function : Needs assist             Mobility Comments: pivots to wheelchair ADLs Comments: assist with showers from hospice aide 3x/wk     Extremity/Trunk Assessment   Upper Extremity Assessment Upper Extremity Assessment: Defer to OT evaluation    Lower Extremity Assessment Lower  Extremity Assessment: LLE deficits/detail LLE Deficits / Details: wearing limb protector throughout with wound vac and shrinker. noted pt to lift into bed and moving [facility] for mobility up to and back from [facility]       Communication   Communication Communication: No apparent difficulties  Cognition Arousal: Alert Behavior During Therapy: WFL for tasks assessed/performed Overall Cognitive Status: Within Functional Limits for tasks assessed                                          General Comments      Exercises     Assessment/Plan    PT Assessment Patient needs continued PT services  PT Problem List Decreased strength;Decreased balance;Decreased activity tolerance;Decreased knowledge of use of DME;Pain       PT Treatment Interventions DME instruction;Functional mobility training;Balance training;Patient/family education;Therapeutic activities;Gait training;Therapeutic exercise;Wheelchair mobility training    PT Goals (Current goals can be found in the Care Plan section)  Acute Rehab PT Goals Patient Stated Goal: return home, get prosthetic PT Goal Formulation: With patient Time For Goal Achievement: 07/09/23 Potential to Achieve Goals: Good    Frequency Min 1X/week     Co-evaluation               AM-PAC PT "6 Clicks" Mobility  Outcome Measure Help needed turning from your back to your side while in a flat bed without using bedrails?: A Little Help needed moving from lying on your back to sitting on the side of a flat bed without using bedrails?: A Little Help needed moving to and from a bed to a chair (including a wheelchair)?: Total Help needed standing up from a chair using your arms (e.g., wheelchair or bedside chair)?: Total Help needed to walk in hospital room?: Total Help needed climbing 3-5 steps with a railing? : Total 6 Click Score: 10    End of Session Equipment Utilized During Treatment: Gait belt;Other (comment) (L LE limb protector,  wound vac) Activity Tolerance: Patient limited by fatigue Patient left: in bed;with call bell/phone within reach;with bed alarm set   PT Visit Diagnosis: Other abnormalities of gait and mobility (R26.89);Muscle weakness (generalized) (M62.81)    Time: 1336-1400 PT Time Calculation (min) (ACUTE ONLY): 24 min   Charges:   PT Evaluation $PT Eval Moderate Complexity: 1 Mod PT Treatments $Therapeutic Activity: 8-22 mins PT General Charges $$ ACUTE PT VISIT: 1 Visit         Sheran Lawless, PT Acute Rehabilitation Services Office:[phone] 06/25/2023   Elray Mcgregor 06/25/2023, 2:27 PM

## 2023-06-25 NOTE — Progress Notes (Signed)
Inpatient Rehab Admissions Coordinator:   Therapy recommending lower level of care.  Will sign off for CIR.   Estill Dooms, PT, DPT Admissions Coordinator [phone] 06/25/23  1:24 PM

## 2023-06-26 DIAGNOSIS — I739 Peripheral vascular disease, unspecified: Secondary | ICD-10-CM

## 2023-06-26 DIAGNOSIS — I10 Essential (primary) hypertension: Secondary | ICD-10-CM

## 2023-06-26 DIAGNOSIS — S88112S Complete traumatic amputation at level between knee and ankle, left lower leg, sequela: Secondary | ICD-10-CM

## 2023-06-26 DIAGNOSIS — E785 Hyperlipidemia, unspecified: Secondary | ICD-10-CM

## 2023-06-26 DIAGNOSIS — K219 Gastro-esophageal reflux disease without esophagitis: Secondary | ICD-10-CM

## 2023-06-26 DIAGNOSIS — I25118 Atherosclerotic heart disease of native coronary artery with other forms of angina pectoris: Secondary | ICD-10-CM | POA: Diagnosis not present

## 2023-06-26 DIAGNOSIS — I96 Gangrene, not elsewhere classified: Secondary | ICD-10-CM | POA: Diagnosis not present

## 2023-06-26 DIAGNOSIS — E1122 Type 2 diabetes mellitus with diabetic chronic kidney disease: Secondary | ICD-10-CM | POA: Diagnosis not present

## 2023-06-26 DIAGNOSIS — M869 Osteomyelitis, unspecified: Secondary | ICD-10-CM | POA: Diagnosis not present

## 2023-06-26 DIAGNOSIS — E119 Type 2 diabetes mellitus without complications: Secondary | ICD-10-CM

## 2023-06-26 DIAGNOSIS — N183 Chronic kidney disease, stage 3 unspecified: Secondary | ICD-10-CM

## 2023-06-26 DIAGNOSIS — Z794 Long term (current) use of insulin: Secondary | ICD-10-CM

## 2023-06-26 LAB — BASIC METABOLIC PANEL
Anion gap: 9 (ref 5–15)
BUN: 43 mg/dL — ABNORMAL HIGH (ref 8–23)
CO2: 22 mmol/L (ref 22–32)
Calcium: 8.4 mg/dL — ABNORMAL LOW (ref 8.9–10.3)
Chloride: 103 mmol/L (ref 98–111)
Creatinine, Ser: 1.47 mg/dL — ABNORMAL HIGH (ref 0.61–1.24)
GFR, Estimated: 46 mL/min — ABNORMAL LOW (ref >60)
Glucose, Bld: 224 mg/dL — ABNORMAL HIGH (ref 70–99)
Potassium: 4 mmol/L (ref 3.5–5.1)
Sodium: 134 mmol/L — ABNORMAL LOW (ref 135–145)

## 2023-06-26 LAB — GLUCOSE, CAPILLARY
Glucose-Capillary: 141 mg/dL — ABNORMAL HIGH (ref 70–99)
Glucose-Capillary: 177 mg/dL — ABNORMAL HIGH (ref 70–99)
Glucose-Capillary: 185 mg/dL — ABNORMAL HIGH (ref 70–99)
Glucose-Capillary: 202 mg/dL — ABNORMAL HIGH (ref 70–99)

## 2023-06-26 LAB — CBC
HCT: 26.3 % — ABNORMAL LOW (ref 39.0–52.0)
Hemoglobin: 8.4 g/dL — ABNORMAL LOW (ref 13.0–17.0)
MCH: 26.4 pg (ref 26.0–34.0)
MCHC: 31.9 g/dL (ref 30.0–36.0)
MCV: 82.7 fL (ref 80.0–100.0)
Platelets: 229 10*3/uL (ref 150–400)
RBC: 3.18 MIL/uL — ABNORMAL LOW (ref 4.22–5.81)
RDW: 15.9 % — ABNORMAL HIGH (ref 11.5–15.5)
WBC: 12.9 10*3/uL — ABNORMAL HIGH (ref 4.0–10.5)
nRBC: 0 % (ref 0.0–0.2)

## 2023-06-26 LAB — SURGICAL PATHOLOGY

## 2023-06-26 MED ORDER — SPIRONOLACTONE 12.5 MG HALF TABLET
12.5000 mg | ORAL_TABLET | Freq: Every day | ORAL | Status: DC
  Administered 2023-06-26 – 2023-07-01 (×6): 12.5 mg via ORAL
  Filled 2023-06-26 (×6): qty 1

## 2023-06-26 MED ORDER — SPIRONOLACTONE-HCTZ 25-25 MG PO TABS: 0.5000 | ORAL_TABLET | Freq: Every day | ORAL | Status: DC

## 2023-06-26 MED ORDER — HYDROCHLOROTHIAZIDE 12.5 MG PO TABS
12.5000 mg | ORAL_TABLET | Freq: Every day | ORAL | Status: DC
  Administered 2023-06-26 – 2023-07-01 (×6): 12.5 mg via ORAL
  Filled 2023-06-26 (×6): qty 1

## 2023-06-26 NOTE — Care Management Important Message (Signed)
Important Message  Patient Details  Name: PRESS CASALE MRN: [id number] Date of Birth: 06-Apr-1933   Important Message Given:  Yes - Medicare IM     Sherilyn Banker 06/26/2023, 3:09 PM

## 2023-06-26 NOTE — Progress Notes (Signed)
Inpatient Rehab Coordinator Note:  I met with patient at bedside to discuss CIR recommendations and goals/expectations of CIR stay.  We reviewed 3 hrs/day of therapy, physician follow up, and average length of stay 2 weeks (dependent upon progress) with goals of supervision w/c level.  Pt reports he lives at home with his daughter who is available 24/7.  Pt does receive hospice services for dual diagnosis of renal failure and CAD and very limited treatment options.  He is still able to mobilize around his home without significant help and leaves the house a few times a week.  I will start insurance request today.   Estill Dooms, PT, DPT Admissions Coordinator [phone] 06/26/23  1:27 PM

## 2023-06-26 NOTE — Progress Notes (Signed)
PROGRESS NOTE  Justin [name]  ZOX:[id number] DOB: Jun 08, 1933 DOA: 06/19/2023 PCP: Estevan Oaks, NP   Brief Narrative: Patient is a 89 year old male with history of diabetes type 2, GERD, hypertension, hyperlipidemia, coronary disease status post CABG, peripheral artery disease, history of prostate cancer, CKD stage IIIb who presented to the emergency room with complaint of worsening ulcer on his left great toe.  He observed drainage of pus and blood when the dressing was being changed, had foul smell.  No history of fever or chills.  On presentation he was hypertensive, afebrile.  Lab work showed creatinine of 1.76, baseline creatinine of 1.4-1.5.  Left foot x-ray showed increased subcutaneous air within the distal aspect of the great toe suspicious for early osteomyelitis.  Vascular surgery, orthopedics consulted.  Underwent left lower extremity angiogram on 9/23 with finding of small vessel disease not amenable to intervention.  S/P transtibial amputation of the left on 9/25.  PT/OT pending.  Recommendation is CIR/SNF.   Assessment & Plan:  Principal Problem:   Gangrene of toe of left foot (HCC) Active Problems:   Claudication in peripheral vascular disease (HCC)   Normocytic anemia   CAD s/p CABG in 2009   CKD stage 3 due to type 2 diabetes mellitus (HCC)   Diabetes mellitus without complication (HCC)   Asymptomatic bilateral carotid artery stenosis   Primary hypertension   GERD (gastroesophageal reflux disease)   HLD (hyperlipidemia)   Osteomyelitis of great toe of left foot (HCC)   Gangrene of left lower extremity: Patient presented with  complaint of worsening ulcer on his left great toe.  Patient observed drainage of pus and blood when the dressing was being changed, had foul smell.  X-rays suggestive of  of osteomyelitis.  Vascular surgery, orthopedics consulted.    Underwent left lower extremity angiogram on 9/23 with finding of small vessel disease not amenable to  intervention.  Orthopedics did transtibial amputation on 9/25, PT/OT recommending CIR/SNF.  Developed mild leukocytosis , likely reactive, CBC pending for today.  Low suspicious for infectious etiology.  Antibiotics discontinued after 24 hours.  Blood cultures have not shown any growth  Peripheral vascular disease: Follows with Dr Jacinto Halim, vascular surgery following here  Normocytic anemia: Likely from chronic medical conditions.    Iron of 26.  Given IV iron.    No evidence of acute blood loss except for surgery.  Transfused with  a unit of PRBC on 9/26  Coronary artery disease: Status post CABG. no anginal Symptoms.  On Lipitor, aspirin, Zetia, Zocor at home  CKD stage IIIb/NAGMA: Baseline creatinine 1.4-1.6.  Follows with nephrology as an outpatient. Started sodium bicarb tablets  Diabetes type 2: A1c of 8.8.  Monitor blood sugars.  Continue sliding scale  insulin.  Monitor blood sugars  Bilateral carotid artery stenosis: Follows with cardiology  Hypertension: Monitor blood pressure.  On labetalol, Lasix, spironolactone, hydrochlorothiazide at home  Hyperlipidemia: Continue zokor, Zetia  GERD: Continue PPI  Peripheral neuropathy: Complained of bilateral lower extremity discomfort.  Continue gabapentin, increased the dose to 200 mg 3 times a day with improvement        DVT prophylaxis:SCD's Start: 06/24/23 1619 heparin injection 5,000 Units Start: 06/19/23 2200     Code Status: Full Code  Family Communication: Discussed with daughter on phone on 9/26  Patient status:Inpatient  Patient is from :Home  Anticipated discharge to:CIR/SNF  Estimated DC date: 1-2 days   Consultants: vascular surgery, orthopedics  Procedures: Left transtibial amputation  Antimicrobials:  Anti-infectives (From  admission, onward)    Start     Dose/Rate Route Frequency Ordered Stop   06/24/23 1715  ceFAZolin (ANCEF) IVPB 2g/100 mL premix  Status:  Discontinued        2 g 200 mL/hr over 30  Minutes Intravenous Every 8 hours 06/24/23 1619 06/24/23 1638   06/24/23 0600  ceFAZolin (ANCEF) IVPB 2g/100 mL premix        2 g 200 mL/hr over 30 Minutes Intravenous On call to O.R. 06/23/23 1614 06/24/23 1510   06/23/23 1800  vancomycin (VANCOCIN) IVPB 1000 mg/200 mL premix  Status:  Discontinued        1,000 mg 200 mL/hr over 60 Minutes Intravenous Every 36 hours 06/22/23 1153 06/25/23 1203   06/21/23 1000  vancomycin (VANCOREADY) IVPB 750 mg/150 mL  Status:  Discontinued        750 mg 150 mL/hr over 60 Minutes Intravenous Every 24 hours 06/20/23 0907 06/22/23 1153   06/20/23 1000  vancomycin (VANCOREADY) IVPB 500 mg/100 mL        500 mg 100 mL/hr over 60 Minutes Intravenous  Once 06/20/23 0907 06/20/23 1118   06/19/23 2000  metroNIDAZOLE (FLAGYL) IVPB 500 mg        500 mg 100 mL/hr over 60 Minutes Intravenous Every 12 hours 06/19/23 1805 06/25/23 2259   06/19/23 1930  cefTRIAXone (ROCEPHIN) 2 g in sodium chloride 0.9 % 100 mL IVPB        2 g 200 mL/hr over 30 Minutes Intravenous Every 24 hours 06/19/23 1839 06/25/23 1634   06/19/23 1900  cefTRIAXone (ROCEPHIN) 1 g in sodium chloride 0.9 % 100 mL IVPB  Status:  Discontinued        1 g 200 mL/hr over 30 Minutes Intravenous Every 24 hours 06/19/23 1805 06/19/23 1832   06/19/23 1630  vancomycin (VANCOCIN) IVPB 1000 mg/200 mL premix        1,000 mg 200 mL/hr over 60 Minutes Intravenous  Once 06/19/23 1615 06/19/23 1800       Subjective: Patient seen and examined the bedside today.  Hemodynamically stable, comfortably lying in bed.  Complains of some pain on the amputation site but overall looks comfortable.  No new complaints  Objective: Vitals:   06/25/23 1936 06/25/23 2315 06/26/23 0410 06/26/23 0754  BP: (!) 148/55 (!) 158/64 (!) 155/68 (!) 165/55  Pulse: 71 64 66 67  Resp: 18 20 18 18   Temp: 98.7 F (37.1 C) 98.5 F (36.9 C) 98.2 F (36.8 C) 98.8 F (37.1 C)  TempSrc: Oral Oral Oral Oral  SpO2: 96% 90% 94% 99%   Weight:      Height:        Intake/Output Summary (Last 24 hours) at 06/26/2023 1104 Last data filed at 06/26/2023 4782 Gross per 24 hour  Intake 724 ml  Output 750 ml  Net -26 ml    Filed Weights   06/19/23 1315  Weight: 71.2 kg    Examination:  General exam: Overall comfortable, not in distress, pleasant elderly gentleman HEENT: PERRL Respiratory system:  no wheezes or crackles  Cardiovascular system: S1 & S2 heard, RRR.  Gastrointestinal system: Abdomen is nondistended, soft and nontender. Central nervous system: Alert and oriented Extremities: wound VAC on the left lower extremity Skin: No rashes, no ulcers,no icterus     Data Reviewed: I have personally reviewed following labs and imaging studies  CBC: Recent Labs  Lab 06/19/23 1319 06/20/23 0741 06/21/23 0854 06/23/23 0829 06/24/23 0630 06/25/23 0347  WBC 11.1*  10.3 11.3* 16.4* 14.2* 16.5*  NEUTROABS 8.7*  --   --   --   --   --   HGB 9.1* 9.0* 9.0* 8.4* 7.2* 7.2*  HCT 28.4* 28.8* 27.3* 26.0* 22.4* 22.3*  MCV 87.7 85.0 83.5 86.1 83.9 84.2  PLT 250 256 255 239 214 219   Basic Metabolic Panel: Recent Labs  Lab 06/21/23 0854 06/22/23 0922 06/23/23 0829 06/24/23 0630 06/25/23 0347  NA 136 138 136 134* 133*  K 4.0 4.1 3.4* 3.4* 4.5  CL 108 108 111 103 105  CO2 21* 16* 15* 23 20*  GLUCOSE 173* 179* 131* 162* 263*  BUN 32* 33* 35* 31* 30*  CREATININE 1.59* 1.81* 1.81* 1.83* 1.61*  CALCIUM 8.9 9.1 7.5* 8.1* 8.1*     Recent Results (from the past 240 hour(s))  Culture, blood (Routine X 2) w Reflex to ID Panel     Status: None   Collection Time: 06/20/23  8:55 AM   Specimen: BLOOD RIGHT ARM  Result Value Ref Range Status   Specimen Description BLOOD RIGHT ARM  Final   Special Requests   Final    BOTTLES DRAWN AEROBIC AND ANAEROBIC Blood Culture adequate volume   Culture   Final    NO GROWTH 5 DAYS Performed at [facility], [address] [place], [place] [address]    Report Status  06/25/2023 FINAL  Final  Culture, blood (Routine X 2) w Reflex to ID Panel     Status: None   Collection Time: 06/20/23  8:57 AM   Specimen: BLOOD RIGHT ARM  Result Value Ref Range Status   Specimen Description BLOOD RIGHT ARM  Final   Special Requests   Final    BOTTLES DRAWN AEROBIC AND ANAEROBIC Blood Culture adequate volume   Culture   Final    NO GROWTH 5 DAYS Performed at [facility], [address] [place], [place] [address]    Report Status 06/25/2023 FINAL  Final  Surgical pcr screen     Status: None   Collection Time: 06/23/23  6:26 PM   Specimen: Nasal Mucosa; Nasal Swab  Result Value Ref Range Status   MRSA, PCR NEGATIVE NEGATIVE Final   Staphylococcus aureus NEGATIVE NEGATIVE Final    Comment: (NOTE) The Xpert SA Assay (FDA approved for NASAL specimens in patients 53 years of age and older), is one component of a comprehensive surveillance program. It is not intended to diagnose infection nor to guide or monitor treatment. Performed at [facility], [address], [place], [place] [address]      Radiology Studies: No results found.  Scheduled Meds:  vitamin C  1,000 mg Oral Daily   aspirin  81 mg Oral Daily   docusate sodium  100 mg Oral Daily   DULoxetine  60 mg Oral Daily   ezetimibe  10 mg Oral QPM   furosemide  40 mg Oral Daily   gabapentin  200 mg Oral TID   heparin injection (subcutaneous)  5,000 Units Subcutaneous Q8H   influenza vaccine adjuvanted  0.5 mL Intramuscular Tomorrow-1000   insulin aspart  0-9 Units Subcutaneous TID WC   insulin glargine-yfgn  10 Units Subcutaneous Daily   labetalol  100 mg Oral BID   nutrition supplement (JUVEN)  1 packet Oral BID BM   pantoprazole  80 mg Oral Daily   pneumococcal 20-valent conjugate vaccine  0.5 mL Intramuscular Tomorrow-1000   polyethylene glycol  17 g Oral Daily  ropinirole  5 mg Oral QHS   senna  1 tablet Oral BID   simvastatin  20 mg Oral QHS   sodium bicarbonate  650 mg Oral  BID   sodium chloride flush  3 mL Intravenous Q12H   tamsulosin  0.4 mg Oral QPC supper   zinc sulfate  220 mg Oral Daily   Continuous Infusions:  sodium chloride     sodium chloride     magnesium sulfate bolus IVPB       LOS: 7 days   Burnadette Pop, MD [facility] P9/27/2024, 11:04 AM

## 2023-06-26 NOTE — Progress Notes (Signed)
Inpatient Rehab Admissions Coordinator:   PT now recommending CIR.  I will place a consult order per our protocol and f/u.   Estill Dooms, PT, DPT Admissions Coordinator [phone] 06/26/23  11:09 AM

## 2023-06-26 NOTE — Consult Note (Signed)
Physical Medicine and Rehabilitation Consult Reason for Consult: Rehab Referring Physician: Dr. Renford Dills   HPI: Justin [name] is a 89 y.o. male with past medical history of CAD s/p CABG, CKD, CAD 3B, diabetes mellitus type 2 with diabetic retinopathy and peripheral neuropathy, hypertension, peripheral artery disease, history of prostate cancer who presented with worsening ulcer of his left first toe with purulent drainage and foul smell.  Left foot x-ray on 9/20 indicated subcutaneous air in the distal aspect of the great toe, possible exposure of bone to air, possible cortical thinning that may represent early osteomyelitis.  He had a left lower extremity angiogram with small vessel disease.  He had left below the knee amputation for left great toe gangrene by Dr. Eual Fines on 06/24/2023 with wound VAC placement.  Gabapentin dose has been increased to 200 mg 3 times daily.   Patient has been evaluated by physical therapy and Occupational Therapy and found to have functional deficits requiring inpatient rehabilitation.   Reports he lives in a one-story home with 4 steps to enter, 2 daughters can assist 24/7 after discharge  Review of Systems  Constitutional:  Negative for chills and fever.  Eyes:  Negative for double vision.  Respiratory:         Chronic SOB with activity   Cardiovascular:  Negative for chest pain.  Gastrointestinal:  Negative for abdominal pain, constipation, diarrhea and vomiting.  Genitourinary: Negative.   Musculoskeletal:  Positive for joint pain.  Skin:  Negative for rash.  Neurological:  Positive for weakness.   Past Medical History:  Diagnosis Date   Anxiety    Arthritis    Carotid arterial disease (HCC)    CKD (chronic kidney disease)    Coronary artery disease    Diabetes mellitus without complication (HCC)    Diabetic retinopathy (HCC)    NPDR OU   Diverticulitis    Dyspnea    GERD (gastroesophageal reflux disease)    History of kidney stones 2021    Hypercholesteremia    Hypertension    Hypertensive retinopathy    OU   Myocardial infarction [facility]) 2009   Pneumonia    as a child   Prostate cancer (HCC)    UTI (lower urinary tract infection)    Past Surgical History:  Procedure Laterality Date   ABDOMINAL AORTOGRAM W/LOWER EXTREMITY N/A 06/22/2023   Procedure: ABDOMINAL AORTOGRAM W/LOWER EXTREMITY;  Surgeon: Maeola Harman, MD;  Location: [facility] INVASIVE CV LAB;  Service: Cardiovascular;  Laterality: N/A;   ABDOMINAL SURGERY     for diverticulitis; also removed appendix   AMPUTATION Left 06/24/2023   Procedure: LEFT BELOW THE KNEE AMPUTATION;  Surgeon: Nadara Mustard, MD;  Location: [facility] OR;  Service: Orthopedics;  Laterality: Left;   APPENDECTOMY     CARDIAC CATHETERIZATION  2018   CATARACT EXTRACTION     CATARACT EXTRACTION, BILATERAL     CHOLECYSTECTOMY N/A 10/12/2017   Procedure: LAPAROSCOPIC CHOLECYSTECTOMY;  Surgeon: Abigail Miyamoto, MD;  Location: [facility] OR;  Service: General;  Laterality: N/A;   CORONARY ARTERY BYPASS GRAFT  2009   ERCP N/A 12/21/2018   Procedure: ENDOSCOPIC RETROGRADE CHOLANGIOPANCREATOGRAPHY (ERCP);  Surgeon: Jeani Hawking, MD;  Location: [facility] ENDOSCOPY;  Service: Endoscopy;  Laterality: N/A;   ESOPHAGOGASTRODUODENOSCOPY (EGD) WITH PROPOFOL N/A 12/17/2018   Procedure: ESOPHAGOGASTRODUODENOSCOPY (EGD) WITH PROPOFOL;  Surgeon: Jeani Hawking, MD;  Location: [facility] ENDOSCOPY;  Service: Endoscopy;  Laterality: N/A;   EUS Left 12/17/2018   Procedure: UPPER ENDOSCOPIC ULTRASOUND (EUS) LINEAR;  Surgeon: Jeani Hawking, MD;  Location: [facility] ENDOSCOPY;  Service: Endoscopy;  Laterality: Left;   EYE SURGERY     "for bleeding in eye"   HERNIA REPAIR     LEFT HEART CATH AND CORONARY ANGIOGRAPHY N/A 11/04/2016   Procedure: Left Heart Cath and Coronary Angiography;  Surgeon: Yates Decamp, MD;  Location: [facility] INVASIVE CV LAB;  Service: Cardiovascular;  Laterality: N/A;   LOWER EXTREMITY ANGIOGRAPHY N/A 11/04/2016   Procedure: Lower  Extremity Angiography;  Surgeon: Yates Decamp, MD;  Location: [facility] INVASIVE CV LAB;  Service: Cardiovascular;  Laterality: N/A;   LOWER EXTREMITY ANGIOGRAPHY N/A 01/03/2020   Procedure: LOWER EXTREMITY ANGIOGRAPHY;  Surgeon: Yates Decamp, MD;  Location: [facility] INVASIVE CV LAB;  Service: Cardiovascular;  Laterality: N/A;   LOWER EXTREMITY ANGIOGRAPHY Bilateral 01/14/2022   Procedure: Lower Extremity Angiography;  Surgeon: Yates Decamp, MD;  Location: [facility] INVASIVE CV LAB;  Service: Cardiovascular;  Laterality: Bilateral;   LUMBAR LAMINECTOMY/DECOMPRESSION MICRODISCECTOMY Bilateral 11/09/2020   Procedure: Laminectomy and Foraminotomy - bilateral - Lumbar Four-Five.;  Surgeon: Julio Sicks, MD;  Location: [facility] OR;  Service: Neurosurgery;  Laterality: Bilateral;  posterior   PROSTATE SURGERY     REMOVAL OF STONES  12/21/2018   Procedure: REMOVAL OF STONES;  Surgeon: Jeani Hawking, MD;  Location: [facility] ENDOSCOPY;  Service: Endoscopy;;   SPHINCTEROTOMY  12/21/2018   Procedure: Dennison Mascot;  Surgeon: Jeani Hawking, MD;  Location: [facility] ENDOSCOPY;  Service: Endoscopy;;   Family History  Problem Relation Age of Onset   Diabetes Father    Diabetes Maternal Aunt    Diabetes Maternal Uncle    Diabetes Maternal Grandmother    Heart disease Sister    Diabetes Brother    Heart disease Sister    Social History:  reports that he has quit smoking. His smoking use included cigarettes. He has a 0.5 pack-year smoking history. He has never used smokeless tobacco. He reports that he does not drink alcohol and does not use drugs. Allergies:  Allergies  Allergen Reactions   Contrast Media [Iodinated Contrast Media] Itching    PT STATES ALLERGY TO "CT DYE".   Pletal [Cilostazol] Diarrhea   Amlodipine Swelling    Leg swelling   Dye Fdc Red [Red Dye #40 (Allura Red)] Swelling    CAT scan dye   Ibuprofen Other (See Comments)    Upset stomach    Medications Prior to Admission  Medication Sig Dispense Refill   aspirin (ASPIRIN  CHILDRENS) 81 MG chewable tablet Chew 1 tablet (81 mg total) by mouth daily. (Patient taking differently: Chew 81 mg by mouth daily in the afternoon.)     cholecalciferol (VITAMIN D3) 25 MCG (1000 UNIT) tablet Take 1,000 Units by mouth daily.     clonazePAM (KLONOPIN) 0.5 MG tablet Take 0.5 mg by mouth daily as needed for anxiety.     docusate sodium (COLACE) 50 MG capsule Take 100 mg by mouth daily as needed for mild constipation or moderate constipation.     DULoxetine (CYMBALTA) 30 MG capsule Take 2 capsules by mouth once daily 90 capsule 0   ezetimibe (ZETIA) 10 MG tablet TAKE 1 TABLET DAILY AFTER SUPPER (Patient taking differently: Take 10 mg by mouth at bedtime.) 90 tablet 3   furosemide (LASIX) 40 MG tablet Take 40 mg by mouth daily.     gabapentin (NEURONTIN) 100 MG capsule Take 100 mg by mouth 3 (three) times daily.     HYDROcodone-acetaminophen (NORCO/VICODIN) 5-325 MG tablet Take 2 tablets by mouth every 4 (four)  hours.     labetalol (NORMODYNE) 100 MG tablet Take 1 tablet (100 mg total) by mouth 2 (two) times daily. 180 tablet 2   LANTUS SOLOSTAR 100 UNIT/ML Solostar Pen Inject 10 Units into the skin in the morning.     Lidocaine-Benzalkonium 4-0.13 % GEL Apply 1 Pump topically in the morning, at noon, in the evening, and at bedtime. 946 each 1   meclizine (ANTIVERT) 25 MG tablet Take 25 mg by mouth every 6 (six) hours as needed for dizziness.     nitroGLYCERIN (NITRODUR - DOSED IN MG/24 HR) 0.2 mg/hr patch Place 1 patch (0.2 mg total) onto the skin daily. (Patient taking differently: Place 0.2 mg onto the skin daily. Apply to toe) 30 patch 3   omeprazole (PRILOSEC) 40 MG capsule Take 40 mg by mouth daily.      Pyridoxine HCl (B-6 PO) Take 1 capsule by mouth daily.     rivaroxaban (XARELTO) 2.5 MG TABS tablet Take 1 tablet (2.5 mg total) by mouth 2 (two) times daily. 180 tablet 1   ropinirole (REQUIP) 5 MG tablet Take 5 mg by mouth at bedtime.     simvastatin (ZOCOR) 20 MG tablet Take  20 mg by mouth at bedtime.     spironolactone-hydrochlorothiazide (ALDACTAZIDE) 25-25 MG tablet TAKE 1/2 (ONE-HALF) TABLET BY MOUTH IN THE MORNING 45 tablet 0   tamsulosin (FLOMAX) 0.4 MG CAPS capsule TAKE 1 CAPSULE BY MOUTH ONCE DAILY AFTER SUPPER 90 capsule 0   vitamin B-12 (CYANOCOBALAMIN) 500 MCG tablet Take 500 mcg by mouth daily.     Vitamin E 268 MG (400 UNIT) CAPS Take 400 Units by mouth daily.      Home: Home Living Family/patient expects to be discharged to:: Private residence Living Arrangements: Children Available Help at Discharge: Family, Available 24 hours/day Type of Home: House Home Access: Stairs to enter [employer] of Steps: 5 Entrance Stairs-Rails: Left, Right Home Layout: One level Bathroom Shower/Tub: Engineer, manufacturing systems: Standard Bathroom Accessibility: Yes Home Equipment: Agricultural consultant (2 wheels), [employer] - single point, Information systems manager, Wheelchair - manual  Functional History: Prior Function Prior Level of Function : Needs assist Mobility Comments: pivots to wheelchair ADLs Comments: assist with showers from hospice aide 3x/wk Functional Status:  Mobility: Bed Mobility Overal bed mobility: Needs Assistance Bed Mobility: Sit to Supine Supine to sit: Min assist Sit to supine: Contact guard assist General bed mobility comments: for positioning with cues Transfers Overall transfer level: Needs assistance Equipment used: Rolling walker (2 wheels) Transfers: Sit to/from Stand, Bed to chair/wheelchair/BSC Sit to Stand: Mod assist, +2 safety/equipment Bed to/from chair/wheelchair/BSC transfer type:: Stand pivot Stand pivot transfers: Mod assist, +2 safety/equipment General transfer comment: stood from EOB after NT assisted to bed from recliner, noted incontinent of stool so retrieved BSC and pt stood with +2 A for safety to RW then step/hop pivot to [facility] initially with L foot forward needing help to block to foot for safety with sit to  stand Ambulation/Gait General Gait Details: limited to bedside only due to fatigue    ADL: ADL Overall ADL's : Needs assistance/impaired Eating/Feeding: Set up, Sitting Grooming: Wash/dry hands, Wash/dry face, Contact guard assist, Sitting Upper Body Bathing: Minimal assistance, Sitting Lower Body Bathing: Moderate assistance, Sitting/lateral leans Upper Body Dressing : Minimal assistance, Sitting Lower Body Dressing: Moderate assistance, Sitting/lateral leans Toilet Transfer: Maximal assistance, Stand-pivot, Rolling walker (2 wheels), BSC/3in1 Toileting- Clothing Manipulation and Hygiene: Total assistance, Sit to/from stand  Cognition: Cognition Overall Cognitive Status: Within  Functional Limits for tasks assessed Orientation Level: Oriented X4 Cognition Arousal: Alert Behavior During Therapy: WFL for tasks assessed/performed Overall Cognitive Status: Within Functional Limits for tasks assessed  Blood pressure (!) 173/60, pulse 66, temperature 98.4 F (36.9 C), temperature source Oral, resp. rate 16, height 5\' 7"  (1.702 m), weight 71.2 kg, SpO2 99%. Physical Exam   General: Alert and oriented x 3, No apparent distress HEENT: Head is normocephalic, atraumatic, PERRLA, EOMI, sclera anicteric, oral mucosa pink and moist, wearing glasses Neck: Supple without JVD or lymphadenopathy Heart: Reg rate and rhythm. Chest: CTA bilaterally without wheezes, rales, or rhonchi; nonlabored breathing Abdomen: Soft, non-tender, non-distended, bowel sounds positive. Extremities: Left BKA with wound VAC-no drainage in the canister.  Wearing limb protector Psych: Pt's affect is appropriate. Pt is cooperative Skin: Clean and intact without signs of breakdown Dark area of skin under his left eye-reports this is chronic Neuro: Follows commands, cranial nerves II through XII grossly intact, no speech or language deficits noted Sensation intact light touch in all 4 extremities, reports tingling  sensation throughout his right leg Strength 5 out of 5 in bilateral upper extremities and right lower extremity Able to lift left lower extremity Finger-nose intact bilaterally however appears to have mild tremor bilaterally Musculoskeletal: No joint swelling or tenderness noted   Results for orders placed or performed during the hospital encounter of 06/19/23 (from the past 24 hour(s))  Glucose, capillary     Status: Abnormal   Collection Time: 06/25/23 12:22 PM  Result Value Ref Range   Glucose-Capillary 178 (H) 70 - 99 mg/dL   Comment 1 Notify RN   Glucose, capillary     Status: Abnormal   Collection Time: 06/25/23  4:52 PM  Result Value Ref Range   Glucose-Capillary 201 (H) 70 - 99 mg/dL   Comment 1 Notify RN   Glucose, capillary     Status: Abnormal   Collection Time: 06/25/23  9:17 PM  Result Value Ref Range   Glucose-Capillary 166 (H) 70 - 99 mg/dL  Glucose, capillary     Status: Abnormal   Collection Time: 06/26/23  6:32 AM  Result Value Ref Range   Glucose-Capillary 141 (H) 70 - 99 mg/dL   Comment 1 Notify RN    Comment 2 Document in Chart   Glucose, capillary     Status: Abnormal   Collection Time: 06/26/23 11:08 AM  Result Value Ref Range   Glucose-Capillary 177 (H) 70 - 99 mg/dL   No results found.  Assessment/Plan: Diagnosis: Left below the knee amputation for left great toe gangrene Does the need for close, 24 hr/day medical supervision in concert with the patient's rehab needs make it unreasonable for this patient to be served in a less intensive setting? Yes Co-Morbidities requiring supervision/potential complications:  -Normocytic anemia, peripheral vascular disease, CKD 3B, coronary artery disease, type 2 diabetes mellitus with peripheral neuropathy and diabetic retinopathy, bilateral carotid artery stenosis, hypertension, hyperlipidemia, GERD Due to bladder management, bowel management, safety, skin/wound care, disease management, medication administration,  pain management, and patient education, does the patient require 24 hr/day rehab nursing? Yes Does the patient require coordinated care of a physician, rehab nurse, therapy disciplines of PT/OT to address physical and functional deficits in the context of the above medical diagnosis(es)? Yes Addressing deficits in the following areas: balance, endurance, locomotion, strength, transferring, bowel/bladder control, bathing, dressing, feeding, grooming, toileting, and psychosocial support Can the patient actively participate in an intensive therapy program of at least 3 hrs of therapy  per day at least 5 days per week? Yes The potential for patient to make measurable gains while on inpatient rehab is excellent Anticipated functional outcomes upon discharge from inpatient rehab are modified independent and supervision  with PT, modified independent and supervision with OT, n/a with SLP. Estimated rehab length of stay to reach the above functional goals is: 10-12 Anticipated discharge destination: Home Overall Rehab/Functional Prognosis: excellent  POST ACUTE RECOMMENDATIONS: This patient's condition is appropriate for continued rehabilitative care in the following setting: CIR Patient has agreed to participate in recommended program. Yes Note that insurance prior authorization may be required for reimbursement for recommended care.  Comment: I think patient would be a great candidate for CIR, rehab coordinator to follow-up   I have personally performed a face to face diagnostic evaluation of this patient. Additionally, I have examined the patient's medical record including any pertinent labs and radiographic images. If the physician assistant has documented in this note, I have reviewed and edited or otherwise concur with the physician assistant's documentation.  Thanks,  Fanny Dance, MD 06/26/2023

## 2023-06-27 DIAGNOSIS — I96 Gangrene, not elsewhere classified: Secondary | ICD-10-CM | POA: Diagnosis not present

## 2023-06-27 LAB — BASIC METABOLIC PANEL
Anion gap: 5 (ref 5–15)
BUN: 39 mg/dL — ABNORMAL HIGH (ref 8–23)
CO2: 25 mmol/L (ref 22–32)
Calcium: 8.4 mg/dL — ABNORMAL LOW (ref 8.9–10.3)
Chloride: 104 mmol/L (ref 98–111)
Creatinine, Ser: 1.34 mg/dL — ABNORMAL HIGH (ref 0.61–1.24)
GFR, Estimated: 51 mL/min — ABNORMAL LOW (ref >60)
Glucose, Bld: 178 mg/dL — ABNORMAL HIGH (ref 70–99)
Potassium: 4.1 mmol/L (ref 3.5–5.1)
Sodium: 134 mmol/L — ABNORMAL LOW (ref 135–145)

## 2023-06-27 LAB — GLUCOSE, CAPILLARY
Glucose-Capillary: 153 mg/dL — ABNORMAL HIGH (ref 70–99)
Glucose-Capillary: 252 mg/dL — ABNORMAL HIGH (ref 70–99)
Glucose-Capillary: 260 mg/dL — ABNORMAL HIGH (ref 70–99)
Glucose-Capillary: 204 mg/dL — ABNORMAL HIGH (ref 70–99)

## 2023-06-27 LAB — CBC
HCT: 27.1 % — ABNORMAL LOW (ref 39.0–52.0)
Hemoglobin: 8.7 g/dL — ABNORMAL LOW (ref 13.0–17.0)
MCH: 26.4 pg (ref 26.0–34.0)
MCHC: 32.1 g/dL (ref 30.0–36.0)
MCV: 82.1 fL (ref 80.0–100.0)
Platelets: 245 10*3/uL (ref 150–400)
RBC: 3.3 MIL/uL — ABNORMAL LOW (ref 4.22–5.81)
RDW: 15.8 % — ABNORMAL HIGH (ref 11.5–15.5)
WBC: 11.7 10*3/uL — ABNORMAL HIGH (ref 4.0–10.5)
nRBC: 0 % (ref 0.0–0.2)

## 2023-06-27 NOTE — Progress Notes (Signed)
PROGRESS NOTE  Justin [name]  ZOX:[id number] DOB: Jan 28, 1933 DOA: 06/19/2023 PCP: Estevan Oaks, NP   Brief Narrative: Patient is a 89 year old male with history of diabetes type 2, GERD, hypertension, hyperlipidemia, coronary disease status post CABG, peripheral artery disease, history of prostate cancer, CKD stage IIIb who presented to the emergency room with complaint of worsening ulcer on his left great toe. drainage of pus and blood when the dressing was being changed, had foul smell.  No history of fever or chills.  On presentation he was hypertensive, afebrile.  Lab work showed creatinine of 1.76, baseline creatinine of 1.4-1.5.  Left foot x-ray showed increased subcutaneous air within the distal aspect of the great toe suspicious for early osteomyelitis.  Vascular surgery, orthopedics consulted.  Underwent left lower extremity angiogram on 9/23 with finding of small vessel disease not amenable to intervention.  S/P transtibial amputation of the left on 9/25.  Medically stable.  Waiting to go to CIR.   Assessment & Plan:  Principal Problem:   Gangrene of toe of left foot (HCC) Active Problems:   Claudication in peripheral vascular disease (HCC)   Normocytic anemia   CAD s/p CABG in 2009   CKD stage 3 due to type 2 diabetes mellitus (HCC)   Diabetes mellitus without complication (HCC)   Asymptomatic bilateral carotid artery stenosis   Primary hypertension   GERD (gastroesophageal reflux disease)   HLD (hyperlipidemia)   Osteomyelitis of great toe of left foot (HCC)  Gangrene of left lower extremity:  Failed conservative management.  With osteomyelitis and small vessel disease patient underwent transtibial amputation on 9/25.   Mobilizing with PT OT.   Pain is managed.  Hemoglobin stable.  Antibiotics completed.   Transfer to rehab when bed available.    Peripheral vascular disease: Follows with Dr Jacinto Halim, vascular surgery following here  Normocytic anemia: Likely from  chronic medical conditions.    Iron of 26.  Given IV iron.    No evidence of acute blood loss except for surgery.  Transfused with  a unit of PRBC on 9/26  Coronary artery disease: Status post CABG. no anginal Symptoms.  On Lipitor, aspirin, Zetia, Zocor at home  CKD stage IIIb/NAGMA: Baseline creatinine 1.4-1.6.  Follows with nephrology as an outpatient. Started sodium bicarb tablets  Diabetes type 2: A1c of 8.8.  Monitor blood sugars.  Continue sliding scale  insulin.  Monitor blood sugars  Bilateral carotid artery stenosis: Follows with cardiology  Hypertension: Monitor blood pressure.  On labetalol, Lasix, spironolactone, hydrochlorothiazide at home  Hyperlipidemia: Continue zokor, Zetia  GERD: Continue PPI  Peripheral neuropathy: Complained of bilateral lower extremity discomfort.  Continue gabapentin, increased the dose to 200 mg 3 times a day with improvement        DVT prophylaxis:SCD's Start: 06/24/23 1619 heparin injection 5,000 Units Start: 06/19/23 2200     Code Status: Full Code  Family Communication: None at the bedside.  Patient status:Inpatient  Patient is from :Home  Anticipated discharge to:CIR/SNF  Estimated DC date: Medically stable.   Consultants: vascular surgery, orthopedics  Procedures: Left transtibial amputation  Antimicrobials:  Anti-infectives (From admission, onward)    Start     Dose/Rate Route Frequency Ordered Stop   06/24/23 1715  ceFAZolin (ANCEF) IVPB 2g/100 mL premix  Status:  Discontinued        2 g 200 mL/hr over 30 Minutes Intravenous Every 8 hours 06/24/23 1619 06/24/23 1638   06/24/23 0600  ceFAZolin (ANCEF) IVPB 2g/100 mL premix  2 g 200 mL/hr over 30 Minutes Intravenous On call to O.R. 06/23/23 1614 06/24/23 1510   06/23/23 1800  vancomycin (VANCOCIN) IVPB 1000 mg/200 mL premix  Status:  Discontinued        1,000 mg 200 mL/hr over 60 Minutes Intravenous Every 36 hours 06/22/23 1153 06/25/23 1203   06/21/23 1000   vancomycin (VANCOREADY) IVPB 750 mg/150 mL  Status:  Discontinued        750 mg 150 mL/hr over 60 Minutes Intravenous Every 24 hours 06/20/23 0907 06/22/23 1153   06/20/23 1000  vancomycin (VANCOREADY) IVPB 500 mg/100 mL        500 mg 100 mL/hr over 60 Minutes Intravenous  Once 06/20/23 0907 06/20/23 1118   06/19/23 2000  metroNIDAZOLE (FLAGYL) IVPB 500 mg        500 mg 100 mL/hr over 60 Minutes Intravenous Every 12 hours 06/19/23 1805 06/25/23 2259   06/19/23 1930  cefTRIAXone (ROCEPHIN) 2 g in sodium chloride 0.9 % 100 mL IVPB        2 g 200 mL/hr over 30 Minutes Intravenous Every 24 hours 06/19/23 1839 06/25/23 1634   06/19/23 1900  cefTRIAXone (ROCEPHIN) 1 g in sodium chloride 0.9 % 100 mL IVPB  Status:  Discontinued        1 g 200 mL/hr over 30 Minutes Intravenous Every 24 hours 06/19/23 1805 06/19/23 1832   06/19/23 1630  vancomycin (VANCOCIN) IVPB 1000 mg/200 mL premix        1,000 mg 200 mL/hr over 60 Minutes Intravenous  Once 06/19/23 1615 06/19/23 1800       Subjective:  Patient seen in the morning rounds.  Denies any complaints.  Pain is controlled.  Objective: Vitals:   06/26/23 2310 06/27/23 0321 06/27/23 0802 06/27/23 1146  BP: (!) 161/59 (!) 160/56 (!) 165/63 (!) 162/63  Pulse: 68 67 65 65  Resp: 20 20 20 20   Temp: 99.1 F (37.3 C) 99.1 F (37.3 C) 98.7 F (37.1 C) 98.1 F (36.7 C)  TempSrc: Oral Oral Oral Oral  SpO2: 94% 94% 99% 100%  Weight:      Height:        Intake/Output Summary (Last 24 hours) at 06/27/2023 1304 Last data filed at 06/27/2023 0745 Gross per 24 hour  Intake 120 ml  Output 1500 ml  Net -1380 ml    Filed Weights   06/19/23 1315  Weight: 71.2 kg    Examination:  General: Looks comfortable. Cardiovascular: S1-S2 normal.  Regular rate rhythm. Respiratory: Bilateral clear.  No added sounds. Gastrointestinal: Soft and nontender.  Bowel sound present. Ext: Left below-knee amputation the stump on dressing and knee  extender.   Data Reviewed: I have personally reviewed following labs and imaging studies  CBC: Recent Labs  Lab 06/23/23 0829 06/24/23 0630 06/25/23 0347 06/26/23 1845 06/27/23 0310  WBC 16.4* 14.2* 16.5* 12.9* 11.7*  HGB 8.4* 7.2* 7.2* 8.4* 8.7*  HCT 26.0* 22.4* 22.3* 26.3* 27.1*  MCV 86.1 83.9 84.2 82.7 82.1  PLT 239 214 219 229 245   Basic Metabolic Panel: Recent Labs  Lab 06/23/23 0829 06/24/23 0630 06/25/23 0347 06/26/23 1845 06/27/23 0310  NA 136 134* 133* 134* 134*  K 3.4* 3.4* 4.5 4.0 4.1  CL 111 103 105 103 104  CO2 15* 23 20* 22 25  GLUCOSE 131* 162* 263* 224* 178*  BUN 35* 31* 30* 43* 39*  CREATININE 1.81* 1.83* 1.61* 1.47* 1.34*  CALCIUM 7.5* 8.1* 8.1* 8.4* 8.4*  Recent Results (from the past 240 hour(s))  Culture, blood (Routine X 2) w Reflex to ID Panel     Status: None   Collection Time: 06/20/23  8:55 AM   Specimen: BLOOD RIGHT ARM  Result Value Ref Range Status   Specimen Description BLOOD RIGHT ARM  Final   Special Requests   Final    BOTTLES DRAWN AEROBIC AND ANAEROBIC Blood Culture adequate volume   Culture   Final    NO GROWTH 5 DAYS Performed at [facility], [address] [place], [place] [address]    Report Status 06/25/2023 FINAL  Final  Culture, blood (Routine X 2) w Reflex to ID Panel     Status: None   Collection Time: 06/20/23  8:57 AM   Specimen: BLOOD RIGHT ARM  Result Value Ref Range Status   Specimen Description BLOOD RIGHT ARM  Final   Special Requests   Final    BOTTLES DRAWN AEROBIC AND ANAEROBIC Blood Culture adequate volume   Culture   Final    NO GROWTH 5 DAYS Performed at [facility], [address] [place], [place] [address]    Report Status 06/25/2023 FINAL  Final  Surgical pcr screen     Status: None   Collection Time: 06/23/23  6:26 PM   Specimen: Nasal Mucosa; Nasal Swab  Result Value Ref Range Status   MRSA, PCR NEGATIVE NEGATIVE Final   Staphylococcus aureus NEGATIVE NEGATIVE Final     Comment: (NOTE) The Xpert SA Assay (FDA approved for NASAL specimens in patients 28 years of age and older), is one component of a comprehensive surveillance program. It is not intended to diagnose infection nor to guide or monitor treatment. Performed at [facility], [address], [place], [place] [address]      Radiology Studies: No results found.  Scheduled Meds:  vitamin C  1,000 mg Oral Daily   aspirin  81 mg Oral Daily   docusate sodium  100 mg Oral Daily   DULoxetine  60 mg Oral Daily   ezetimibe  10 mg Oral QPM   furosemide  40 mg Oral Daily   gabapentin  200 mg Oral TID   heparin injection (subcutaneous)  5,000 Units Subcutaneous Q8H   spironolactone  12.5 mg Oral Daily   And   hydrochlorothiazide  12.5 mg Oral Daily   influenza vaccine adjuvanted  0.5 mL Intramuscular Tomorrow-1000   insulin aspart  0-9 Units Subcutaneous TID WC   insulin glargine-yfgn  10 Units Subcutaneous Daily   labetalol  100 mg Oral BID   nutrition supplement (JUVEN)  1 packet Oral BID BM   pantoprazole  80 mg Oral Daily   pneumococcal 20-valent conjugate vaccine  0.5 mL Intramuscular Tomorrow-1000   polyethylene glycol  17 g Oral Daily   ropinirole  5 mg Oral QHS   senna  1 tablet Oral BID   simvastatin  20 mg Oral QHS   sodium bicarbonate  650 mg Oral BID   sodium chloride flush  3 mL Intravenous Q12H   tamsulosin  0.4 mg Oral QPC supper   zinc sulfate  220 mg Oral Daily   Continuous Infusions:  sodium chloride     sodium chloride     magnesium sulfate bolus IVPB       LOS: 8 days   Dorcas Carrow, MD [facility] P9/28/2024, 1:04 PM

## 2023-06-28 DIAGNOSIS — I96 Gangrene, not elsewhere classified: Secondary | ICD-10-CM | POA: Diagnosis not present

## 2023-06-28 LAB — GLUCOSE, CAPILLARY
Glucose-Capillary: 201 mg/dL — ABNORMAL HIGH (ref 70–99)
Glucose-Capillary: 266 mg/dL — ABNORMAL HIGH (ref 70–99)
Glucose-Capillary: 193 mg/dL — ABNORMAL HIGH (ref 70–99)
Glucose-Capillary: 244 mg/dL — ABNORMAL HIGH (ref 70–99)

## 2023-06-28 NOTE — Progress Notes (Signed)
PROGRESS NOTE  ZOEL BASSANI  OZH:[id number] DOB: 1933/02/14 DOA: 06/19/2023 PCP: Estevan Oaks, NP   Brief Narrative: Patient is a 89 year old male with history of diabetes type 2, GERD, hypertension, hyperlipidemia, coronary disease status post CABG, peripheral artery disease, history of prostate cancer, CKD stage IIIb who presented to the emergency room with complaint of worsening ulcer on his left great toe. drainage of pus and blood when the dressing was being changed, had foul smell.  No history of fever or chills.  On presentation he was hypertensive, afebrile.  Lab work showed creatinine of 1.76, baseline creatinine of 1.4-1.5.  Left foot x-ray showed increased subcutaneous air within the distal aspect of the great toe suspicious for early osteomyelitis.  Vascular surgery, orthopedics consulted.  Underwent left lower extremity angiogram on 9/23 with finding of small vessel disease not amenable to intervention.  S/P transtibial amputation of the left on 9/25.  Medically stable.  Waiting to go to CIR.   Assessment & Plan:  Principal Problem:   Gangrene of toe of left foot (HCC) Active Problems:   Claudication in peripheral vascular disease (HCC)   Normocytic anemia   CAD s/p CABG in 2009   CKD stage 3 due to type 2 diabetes mellitus (HCC)   Diabetes mellitus without complication (HCC)   Asymptomatic bilateral carotid artery stenosis   Primary hypertension   GERD (gastroesophageal reflux disease)   HLD (hyperlipidemia)   Osteomyelitis of great toe of left foot (HCC)  Gangrene of left lower extremity:  Failed conservative management.  With osteomyelitis and small vessel disease patient underwent transtibial amputation on 9/25.   Mobilizing with PT OT.   Pain is managed.  Hemoglobin stable.  Antibiotics completed.   Transfer to rehab when bed available.    Peripheral vascular disease: Follows with Dr Jacinto Halim, vascular surgery following here  Normocytic anemia: Likely from  chronic medical conditions.    Iron of 26.  Given IV iron.    No evidence of acute blood loss except for surgery.  Transfused with  a unit of PRBC on 9/26  Coronary artery disease: Status post CABG. no anginal Symptoms.  On Lipitor, aspirin, Zetia, Zocor at home  CKD stage IIIb/NAGMA: Baseline creatinine 1.4-1.6.  Follows with nephrology as an outpatient. Started sodium bicarb tablets  Diabetes type 2: A1c of 8.8.  Monitor blood sugars.  Continue sliding scale  insulin.  Monitor blood sugars  Bilateral carotid artery stenosis: Follows with cardiology  Hypertension: Monitor blood pressure.  On labetalol, Lasix, spironolactone, hydrochlorothiazide at home  Hyperlipidemia: Continue zokor, Zetia  GERD: Continue PPI  Peripheral neuropathy: Complained of bilateral lower extremity discomfort.  Continue gabapentin, increased the dose to 200 mg 3 times a day with improvement        DVT prophylaxis:SCD's Start: 06/24/23 1619 heparin injection 5,000 Units Start: 06/19/23 2200     Code Status: Full Code  Family Communication: None at the bedside.  Patient status:Inpatient  Patient is from :Home  Anticipated discharge to:CIR/SNF  Estimated DC date: Medically stable.   Consultants: vascular surgery, orthopedics  Procedures: Left transtibial amputation  Antimicrobials:  Anti-infectives (From admission, onward)    Start     Dose/Rate Route Frequency Ordered Stop   06/24/23 1715  ceFAZolin (ANCEF) IVPB 2g/100 mL premix  Status:  Discontinued        2 g 200 mL/hr over 30 Minutes Intravenous Every 8 hours 06/24/23 1619 06/24/23 1638   06/24/23 0600  ceFAZolin (ANCEF) IVPB 2g/100 mL premix  2 g 200 mL/hr over 30 Minutes Intravenous On call to O.R. 06/23/23 1614 06/24/23 1510   06/23/23 1800  vancomycin (VANCOCIN) IVPB 1000 mg/200 mL premix  Status:  Discontinued        1,000 mg 200 mL/hr over 60 Minutes Intravenous Every 36 hours 06/22/23 1153 06/25/23 1203   06/21/23 1000   vancomycin (VANCOREADY) IVPB 750 mg/150 mL  Status:  Discontinued        750 mg 150 mL/hr over 60 Minutes Intravenous Every 24 hours 06/20/23 0907 06/22/23 1153   06/20/23 1000  vancomycin (VANCOREADY) IVPB 500 mg/100 mL        500 mg 100 mL/hr over 60 Minutes Intravenous  Once 06/20/23 0907 06/20/23 1118   06/19/23 2000  metroNIDAZOLE (FLAGYL) IVPB 500 mg        500 mg 100 mL/hr over 60 Minutes Intravenous Every 12 hours 06/19/23 1805 06/25/23 2259   06/19/23 1930  cefTRIAXone (ROCEPHIN) 2 g in sodium chloride 0.9 % 100 mL IVPB        2 g 200 mL/hr over 30 Minutes Intravenous Every 24 hours 06/19/23 1839 06/25/23 1634   06/19/23 1900  cefTRIAXone (ROCEPHIN) 1 g in sodium chloride 0.9 % 100 mL IVPB  Status:  Discontinued        1 g 200 mL/hr over 30 Minutes Intravenous Every 24 hours 06/19/23 1805 06/19/23 1832   06/19/23 1630  vancomycin (VANCOCIN) IVPB 1000 mg/200 mL premix        1,000 mg 200 mL/hr over 60 Minutes Intravenous  Once 06/19/23 1615 06/19/23 1800       Subjective:  Patient seen in the morning rounds.  No overnight events.  Patient himself denies any complaints.  Pain is controlled.  Objective: Vitals:   06/27/23 1930 06/27/23 2250 06/28/23 0323 06/28/23 0805  BP: (!) 155/67 (!) 171/61 (!) 158/62 (!) 134/59  Pulse: 85 65 69   Resp: 15 20 20    Temp: 98.9 F (37.2 C) 98.7 F (37.1 C) 98.7 F (37.1 C) 98.4 F (36.9 C)  TempSrc: Oral Oral Oral Oral  SpO2: 98% 100% 90%   Weight:      Height:        Intake/Output Summary (Last 24 hours) at 06/28/2023 1033 Last data filed at 06/28/2023 0737 Gross per 24 hour  Intake 240 ml  Output 1500 ml  Net -1260 ml    Filed Weights   06/19/23 1315  Weight: 71.2 kg    Examination:  General: Looks comfortable.  Interactive. Cardiovascular: S1-S2 normal. Respiratory: Bilateral clear Gastrointestinal: Soft and nontender Ext: Left below-knee amputation on knee extended, dressing applied.    Data Reviewed: I have  personally reviewed following labs and imaging studies  CBC: Recent Labs  Lab 06/23/23 0829 06/24/23 0630 06/25/23 0347 06/26/23 1845 06/27/23 0310  WBC 16.4* 14.2* 16.5* 12.9* 11.7*  HGB 8.4* 7.2* 7.2* 8.4* 8.7*  HCT 26.0* 22.4* 22.3* 26.3* 27.1*  MCV 86.1 83.9 84.2 82.7 82.1  PLT 239 214 219 229 245   Basic Metabolic Panel: Recent Labs  Lab 06/23/23 0829 06/24/23 0630 06/25/23 0347 06/26/23 1845 06/27/23 0310  NA 136 134* 133* 134* 134*  K 3.4* 3.4* 4.5 4.0 4.1  CL 111 103 105 103 104  CO2 15* 23 20* 22 25  GLUCOSE 131* 162* 263* 224* 178*  BUN 35* 31* 30* 43* 39*  CREATININE 1.81* 1.83* 1.61* 1.47* 1.34*  CALCIUM 7.5* 8.1* 8.1* 8.4* 8.4*     Recent Results (from the  past 240 hour(s))  Culture, blood (Routine X 2) w Reflex to ID Panel     Status: None   Collection Time: 06/20/23  8:55 AM   Specimen: BLOOD RIGHT ARM  Result Value Ref Range Status   Specimen Description BLOOD RIGHT ARM  Final   Special Requests   Final    BOTTLES DRAWN AEROBIC AND ANAEROBIC Blood Culture adequate volume   Culture   Final    NO GROWTH 5 DAYS Performed at [facility], [address] [place], [place] [address]    Report Status 06/25/2023 FINAL  Final  Culture, blood (Routine X 2) w Reflex to ID Panel     Status: None   Collection Time: 06/20/23  8:57 AM   Specimen: BLOOD RIGHT ARM  Result Value Ref Range Status   Specimen Description BLOOD RIGHT ARM  Final   Special Requests   Final    BOTTLES DRAWN AEROBIC AND ANAEROBIC Blood Culture adequate volume   Culture   Final    NO GROWTH 5 DAYS Performed at [facility], [address] [place], [place] [address]    Report Status 06/25/2023 FINAL  Final  Surgical pcr screen     Status: None   Collection Time: 06/23/23  6:26 PM   Specimen: Nasal Mucosa; Nasal Swab  Result Value Ref Range Status   MRSA, PCR NEGATIVE NEGATIVE Final   Staphylococcus aureus NEGATIVE NEGATIVE Final    Comment: (NOTE) The Xpert SA Assay  (FDA approved for NASAL specimens in patients 79 years of age and older), is one component of a comprehensive surveillance program. It is not intended to diagnose infection nor to guide or monitor treatment. Performed at [facility], [address], [place], [place] [address]      Radiology Studies: No results found.  Scheduled Meds:  vitamin C  1,000 mg Oral Daily   aspirin  81 mg Oral Daily   DULoxetine  60 mg Oral Daily   ezetimibe  10 mg Oral QPM   furosemide  40 mg Oral Daily   gabapentin  200 mg Oral TID   heparin injection (subcutaneous)  5,000 Units Subcutaneous Q8H   spironolactone  12.5 mg Oral Daily   And   hydrochlorothiazide  12.5 mg Oral Daily   influenza vaccine adjuvanted  0.5 mL Intramuscular Tomorrow-1000   insulin aspart  0-9 Units Subcutaneous TID WC   insulin glargine-yfgn  10 Units Subcutaneous Daily   labetalol  100 mg Oral BID   nutrition supplement (JUVEN)  1 packet Oral BID BM   pantoprazole  80 mg Oral Daily   pneumococcal 20-valent conjugate vaccine  0.5 mL Intramuscular Tomorrow-1000   ropinirole  5 mg Oral QHS   simvastatin  20 mg Oral QHS   sodium bicarbonate  650 mg Oral BID   sodium chloride flush  3 mL Intravenous Q12H   tamsulosin  0.4 mg Oral QPC supper   zinc sulfate  220 mg Oral Daily   Continuous Infusions:  sodium chloride     sodium chloride     magnesium sulfate bolus IVPB       LOS: 9 days   Dorcas Carrow, MD [facility] P9/29/2024, 10:33 AM

## 2023-06-28 NOTE — Progress Notes (Signed)
Mobility Specialist Progress Note:   06/28/23 1641  Mobility  Activity Transferred from bed to chair  Level of Assistance Minimal assist, patient does 75% or more  Assistive Device Front wheel walker  Distance Ambulated (ft) 4 ft  LLE Weight Bearing NWB  Activity Response Tolerated well  Mobility Referral Yes  $Mobility charge 1 Mobility  Mobility Specialist Start Time (ACUTE ONLY) 1630  Mobility Specialist Stop Time (ACUTE ONLY) 1641  Mobility Specialist Time Calculation (min) (ACUTE ONLY) 11 min   Pt received in bed, agreeable to mobility. Pt able to perform 2x STS and pivot to chair with minA w/o fault or complaint. Asymptomatic throughout. Pt left in chair with call bell in hand and all needs met. RN notified.   Leory Plowman  Mobility Specialist Please contact via [employer] office at [phone]

## 2023-06-29 DIAGNOSIS — I96 Gangrene, not elsewhere classified: Secondary | ICD-10-CM | POA: Diagnosis not present

## 2023-06-29 LAB — GLUCOSE, CAPILLARY
Glucose-Capillary: 186 mg/dL — ABNORMAL HIGH (ref 70–99)
Glucose-Capillary: 218 mg/dL — ABNORMAL HIGH (ref 70–99)
Glucose-Capillary: 184 mg/dL — ABNORMAL HIGH (ref 70–99)
Glucose-Capillary: 190 mg/dL — ABNORMAL HIGH (ref 70–99)

## 2023-06-29 NOTE — Progress Notes (Signed)
Pt transferred to 5N28, family notified. Report given to [facility], RN.

## 2023-06-29 NOTE — Progress Notes (Signed)
Mobility Specialist Progress Note:   06/29/23 0954  Mobility  Activity Transferred from bed to chair  Level of Assistance Minimal assist, patient does 75% or more  Assistive Device Front wheel walker  Distance Ambulated (ft) 4 ft  LLE Weight Bearing NWB  Activity Response Tolerated well  Mobility Referral Yes  $Mobility charge 1 Mobility  Mobility Specialist Start Time (ACUTE ONLY) [id number]  Mobility Specialist Stop Time (ACUTE ONLY) 0940  Mobility Specialist Time Calculation (min) (ACUTE ONLY) 15 min   Pt received in bed, agreeable to mobility. Pt found on bedpan. Void successful. Assisted with pericare. Pt able to stand and pivot with MinA. C/o slight unsteadiness during pivot but no LOB. Pt left in chair with call bell in reach and all needs met.   Leory Plowman  Mobility Specialist Please contact via [employer] office at [phone]

## 2023-06-29 NOTE — Progress Notes (Signed)
Inpatient Rehab Admissions Coordinator:   Awaiting determination from insurance regarding CIR prior auth request.  Will follow.   Estill Dooms, PT, DPT Admissions Coordinator [phone] 06/29/23  10:35 AM

## 2023-06-29 NOTE — Progress Notes (Signed)
Inpatient Rehab Admissions Coordinator:   Insurance requested peer to peer, which was completed.  Request for CIR has been denied.  Pt will need to receive post-acute rehab at an alternate level of care. TOC aware.   Estill Dooms, PT, DPT Admissions Coordinator [phone] 06/29/23  1:56 PM

## 2023-06-29 NOTE — Progress Notes (Signed)
PROGRESS NOTE  Justin [name]  WUJ:[id number] DOB: 24-Aug-1933 DOA: 06/19/2023 PCP: Estevan Oaks, NP   Brief Narrative: Patient is a 89 year old male with history of diabetes type 2, GERD, hypertension, hyperlipidemia, coronary disease status post CABG, peripheral artery disease, history of prostate cancer, CKD stage IIIb who presented to the emergency room with complaint of worsening ulcer on his left great toe. drainage of pus and blood when the dressing was being changed, had foul smell.  No history of fever or chills.  On presentation he was hypertensive, afebrile.  Lab work showed creatinine of 1.76, baseline creatinine of 1.4-1.5.  Left foot x-ray showed increased subcutaneous air within the distal aspect of the great toe suspicious for early osteomyelitis.  Vascular surgery, orthopedics consulted.  Underwent left lower extremity angiogram on 9/23 with finding of small vessel disease not amenable to intervention.  S/P transtibial amputation of the left on 9/25.  Medically stable.  Waiting to go to CIR.   Assessment & Plan:  Principal Problem:   Gangrene of toe of left foot (HCC) Active Problems:   Claudication in peripheral vascular disease (HCC)   Normocytic anemia   CAD s/p CABG in 2009   CKD stage 3 due to type 2 diabetes mellitus (HCC)   Diabetes mellitus without complication (HCC)   Asymptomatic bilateral carotid artery stenosis   Primary hypertension   GERD (gastroesophageal reflux disease)   HLD (hyperlipidemia)   Osteomyelitis of great toe of left foot (HCC)  Gangrene of left lower extremity:  Failed conservative management.  With osteomyelitis and small vessel disease patient underwent transtibial amputation on 9/25.   Mobilizing with PT OT.   Pain is managed.  Hemoglobin stable.  Antibiotics completed.   Transfer to rehab when bed available.    Peripheral vascular disease: Follows with Dr Jacinto Halim, vascular surgery following here  Normocytic anemia: Likely from  chronic medical conditions.    Iron of 26.  Given IV iron.    No evidence of acute blood loss except for surgery.  Transfused with  a unit of PRBC on 9/26  Coronary artery disease: Status post CABG. no anginal Symptoms.  On Lipitor, aspirin, Zetia, Zocor at home  CKD stage IIIb/NAGMA: Baseline creatinine 1.4-1.6.  Follows with nephrology as an outpatient. Started sodium bicarb tablets  Diabetes type 2: A1c of 8.8.  Monitor blood sugars.  Continue sliding scale  insulin.  Monitor blood sugars  Bilateral carotid artery stenosis: Follows with cardiology  Hypertension: Monitor blood pressure.  On labetalol, Lasix, spironolactone, hydrochlorothiazide at home  Hyperlipidemia: Continue zokor, Zetia  GERD: Continue PPI  Peripheral neuropathy: Complained of bilateral lower extremity discomfort.  Continue gabapentin, increased the dose to 200 mg 3 times a day with improvement        DVT prophylaxis:SCD's Start: 06/24/23 1619 heparin injection 5,000 Units Start: 06/19/23 2200     Code Status: Full Code  Family Communication: None at the bedside.  Patient status:Inpatient  Patient is from :Home  Anticipated discharge to:CIR/SNF  Estimated DC date: Medically stable.   Consultants: vascular surgery, orthopedics  Procedures: Left transtibial amputation  Antimicrobials:  Anti-infectives (From admission, onward)    Start     Dose/Rate Route Frequency Ordered Stop   06/24/23 1715  ceFAZolin (ANCEF) IVPB 2g/100 mL premix  Status:  Discontinued        2 g 200 mL/hr over 30 Minutes Intravenous Every 8 hours 06/24/23 1619 06/24/23 1638   06/24/23 0600  ceFAZolin (ANCEF) IVPB 2g/100 mL premix  2 g 200 mL/hr over 30 Minutes Intravenous On call to O.R. 06/23/23 1614 06/24/23 1510   06/23/23 1800  vancomycin (VANCOCIN) IVPB 1000 mg/200 mL premix  Status:  Discontinued        1,000 mg 200 mL/hr over 60 Minutes Intravenous Every 36 hours 06/22/23 1153 06/25/23 1203   06/21/23 1000   vancomycin (VANCOREADY) IVPB 750 mg/150 mL  Status:  Discontinued        750 mg 150 mL/hr over 60 Minutes Intravenous Every 24 hours 06/20/23 0907 06/22/23 1153   06/20/23 1000  vancomycin (VANCOREADY) IVPB 500 mg/100 mL        500 mg 100 mL/hr over 60 Minutes Intravenous  Once 06/20/23 0907 06/20/23 1118   06/19/23 2000  metroNIDAZOLE (FLAGYL) IVPB 500 mg        500 mg 100 mL/hr over 60 Minutes Intravenous Every 12 hours 06/19/23 1805 06/25/23 2259   06/19/23 1930  cefTRIAXone (ROCEPHIN) 2 g in sodium chloride 0.9 % 100 mL IVPB        2 g 200 mL/hr over 30 Minutes Intravenous Every 24 hours 06/19/23 1839 06/25/23 1634   06/19/23 1900  cefTRIAXone (ROCEPHIN) 1 g in sodium chloride 0.9 % 100 mL IVPB  Status:  Discontinued        1 g 200 mL/hr over 30 Minutes Intravenous Every 24 hours 06/19/23 1805 06/19/23 1832   06/19/23 1630  vancomycin (VANCOCIN) IVPB 1000 mg/200 mL premix        1,000 mg 200 mL/hr over 60 Minutes Intravenous  Once 06/19/23 1615 06/19/23 1800       Subjective:  No new events.  Up in the chair.  Pain controlled.  Objective: Vitals:   06/29/23 0403 06/29/23 0811 06/29/23 1138 06/29/23 1226  BP: (!) 182/65 (!) 162/64 (!) 149/64 (!) 159/70  Pulse: 63 63 63 70  Resp: 18 18 19 17   Temp: 98.2 F (36.8 C) 97.6 F (36.4 C) 97.8 F (36.6 C) 98.7 F (37.1 C)  TempSrc: Oral Oral Oral Oral  SpO2: 100% 96% 97% 100%  Weight:      Height:        Intake/Output Summary (Last 24 hours) at 06/29/2023 1321 Last data filed at 06/29/2023 0026 Gross per 24 hour  Intake 120 ml  Output 1250 ml  Net -1130 ml    Filed Weights   06/19/23 1315  Weight: 71.2 kg    Examination:  General: Looks comfortable.  Interactive. Cardiovascular: S1-S2 normal. Respiratory: Bilateral clear Gastrointestinal: Soft and nontender Ext: Left below-knee amputation on knee extended, dressing applied.  Wound VAC in place.    Data Reviewed: I have personally reviewed following labs and  imaging studies  CBC: Recent Labs  Lab 06/23/23 0829 06/24/23 0630 06/25/23 0347 06/26/23 1845 06/27/23 0310  WBC 16.4* 14.2* 16.5* 12.9* 11.7*  HGB 8.4* 7.2* 7.2* 8.4* 8.7*  HCT 26.0* 22.4* 22.3* 26.3* 27.1*  MCV 86.1 83.9 84.2 82.7 82.1  PLT 239 214 219 229 245   Basic Metabolic Panel: Recent Labs  Lab 06/23/23 0829 06/24/23 0630 06/25/23 0347 06/26/23 1845 06/27/23 0310  NA 136 134* 133* 134* 134*  K 3.4* 3.4* 4.5 4.0 4.1  CL 111 103 105 103 104  CO2 15* 23 20* 22 25  GLUCOSE 131* 162* 263* 224* 178*  BUN 35* 31* 30* 43* 39*  CREATININE 1.81* 1.83* 1.61* 1.47* 1.34*  CALCIUM 7.5* 8.1* 8.1* 8.4* 8.4*     Recent Results (from the past 240 hour(s))  Culture, blood (Routine X 2) w Reflex to ID Panel     Status: None   Collection Time: 06/20/23  8:55 AM   Specimen: BLOOD RIGHT ARM  Result Value Ref Range Status   Specimen Description BLOOD RIGHT ARM  Final   Special Requests   Final    BOTTLES DRAWN AEROBIC AND ANAEROBIC Blood Culture adequate volume   Culture   Final    NO GROWTH 5 DAYS Performed at [facility], [address] [place], [place] [address]    Report Status 06/25/2023 FINAL  Final  Culture, blood (Routine X 2) w Reflex to ID Panel     Status: None   Collection Time: 06/20/23  8:57 AM   Specimen: BLOOD RIGHT ARM  Result Value Ref Range Status   Specimen Description BLOOD RIGHT ARM  Final   Special Requests   Final    BOTTLES DRAWN AEROBIC AND ANAEROBIC Blood Culture adequate volume   Culture   Final    NO GROWTH 5 DAYS Performed at [facility], [address] [place], [place] [address]    Report Status 06/25/2023 FINAL  Final  Surgical pcr screen     Status: None   Collection Time: 06/23/23  6:26 PM   Specimen: Nasal Mucosa; Nasal Swab  Result Value Ref Range Status   MRSA, PCR NEGATIVE NEGATIVE Final   Staphylococcus aureus NEGATIVE NEGATIVE Final    Comment: (NOTE) The Xpert SA Assay (FDA approved for NASAL specimens in  patients 23 years of age and older), is one component of a comprehensive surveillance program. It is not intended to diagnose infection nor to guide or monitor treatment. Performed at [facility], [address], [place], [place] [address]      Radiology Studies: No results found.  Scheduled Meds:  vitamin C  1,000 mg Oral Daily   aspirin  81 mg Oral Daily   DULoxetine  60 mg Oral Daily   ezetimibe  10 mg Oral QPM   furosemide  40 mg Oral Daily   gabapentin  200 mg Oral TID   heparin injection (subcutaneous)  5,000 Units Subcutaneous Q8H   spironolactone  12.5 mg Oral Daily   And   hydrochlorothiazide  12.5 mg Oral Daily   influenza vaccine adjuvanted  0.5 mL Intramuscular Tomorrow-1000   insulin aspart  0-9 Units Subcutaneous TID WC   insulin glargine-yfgn  10 Units Subcutaneous Daily   labetalol  100 mg Oral BID   nutrition supplement (JUVEN)  1 packet Oral BID BM   pantoprazole  80 mg Oral Daily   pneumococcal 20-valent conjugate vaccine  0.5 mL Intramuscular Tomorrow-1000   ropinirole  5 mg Oral QHS   simvastatin  20 mg Oral QHS   sodium bicarbonate  650 mg Oral BID   sodium chloride flush  3 mL Intravenous Q12H   tamsulosin  0.4 mg Oral QPC supper   zinc sulfate  220 mg Oral Daily   Continuous Infusions:  sodium chloride     sodium chloride     magnesium sulfate bolus IVPB       LOS: 10 days   Dorcas Carrow, MD [facility] P9/30/2024, 1:21 PM

## 2023-06-29 NOTE — Progress Notes (Signed)
Physical Therapy Treatment Patient Details Name: Justin [name] MRN: [id number] DOB: 1932/10/17 Today's Date: 06/29/2023   History of Present Illness 89 year old male admitted 06/19/23 with complaint of worsening ulcer on his left great toe.  Underwent aortogram and bilateral lower extremity angiography 06/22/23 and  L BKA on 06/24/23.  PMH positive for diabetes type 2, GERD, hypertension, hyperlipidemia, coronary disease status post CABG, peripheral artery disease, history of prostate cancer, CKD stage IIIb who presented to the emergency room with complaint of worsening ulcer on his left great toe.    PT Comments  Pt received in supine and agreeable to session. Pt requesting to use bedpan and is agreeable to transfer to [facility]. Pt demonstrates improved bed mobility with up to CGA for safety and line management. Pt able to stand from EOB and step pivot to [facility] with min A. Pt successfully had a BM and requires total A for pericare in standing. Pt requires increased assist to stand from [facility] and for initial balance due to increased instability and posterior bias. Pt declines sitting in recliner at this time due to increased fatigue and requests to return to supine. Pt continues to benefit from PT services to progress toward functional mobility goals.    If plan is discharge home, recommend the following:     Can travel by private vehicle        Equipment Recommendations  None recommended by PT    Recommendations for Other Services       Precautions / Restrictions Precautions Precautions: Fall Required Braces or Orthoses: Other Brace Other Brace: limb protector Restrictions Weight Bearing Restrictions: No LLE Weight Bearing: Weight bearing as tolerated     Mobility  Bed Mobility Overal bed mobility: Needs Assistance Bed Mobility: Supine to Sit     Supine to sit: Contact guard, HOB elevated, Used rails     General bed mobility comments: Cues for use of rails. Increased time/effort, but no  physical assist needed    Transfers Overall transfer level: Needs assistance Equipment used: Rolling walker (2 wheels) Transfers: Sit to/from Stand, Bed to chair/wheelchair/BSC Sit to Stand: Min assist, Mod assist   Step pivot transfers: Min assist       General transfer comment: STS from EOB with min A for power up. Mod A to stand from [facility] due to increased instability and difficulty transitioning hands to RW. Pt able to offload with BUE to step with RLE to [facility]. Min A for balance and RW management        Balance Overall balance assessment: Needs assistance Sitting-balance support: Feet supported Sitting balance-Leahy Scale: Fair Sitting balance - Comments: sitting EOB   Standing balance support: Reliant on assistive device for balance, Bilateral upper extremity supported, During functional activity Standing balance-Leahy Scale: Poor Standing balance comment: with RW support                            Cognition Arousal: Alert Behavior During Therapy: WFL for tasks assessed/performed Overall Cognitive Status: Within Functional Limits for tasks assessed                                          Exercises      General Comments        Pertinent Vitals/Pain Pain Assessment Pain Assessment: No/denies pain     PT Goals (current goals can now  be found in the care plan section) Acute Rehab PT Goals Patient Stated Goal: return home, get prosthetic PT Goal Formulation: With patient Time For Goal Achievement: 07/09/23 Progress towards PT goals: Progressing toward goals    Frequency    Min 1X/week       AM-PAC PT "6 Clicks" Mobility   Outcome Measure  Help needed turning from your back to your side while in a flat bed without using bedrails?: A Little Help needed moving from lying on your back to sitting on the side of a flat bed without using bedrails?: A Little Help needed moving to and from a bed to a chair (including a  wheelchair)?: A Lot Help needed standing up from a chair using your arms (e.g., wheelchair or bedside chair)?: A Lot Help needed to walk in hospital room?: A Lot Help needed climbing 3-5 steps with a railing? : Total 6 Click Score: 13    End of Session Equipment Utilized During Treatment: Gait belt;Other (comment) (LLE limb protector) Activity Tolerance: Patient tolerated treatment well   Nurse Communication: Mobility status PT Visit Diagnosis: Other abnormalities of gait and mobility (R26.89);Muscle weakness (generalized) (M62.81)     Time: 2952-8413 PT Time Calculation (min) (ACUTE ONLY): 20 min  Charges:    $Therapeutic Activity: 8-22 mins PT General Charges $$ ACUTE PT VISIT: 1 Visit                     Johny Shock, PTA Acute Rehabilitation Services Secure Chat Preferred  [phone]    Johny Shock 06/29/2023, 3:34 PM

## 2023-06-30 DIAGNOSIS — I96 Gangrene, not elsewhere classified: Secondary | ICD-10-CM | POA: Diagnosis not present

## 2023-06-30 LAB — GLUCOSE, CAPILLARY
Glucose-Capillary: 164 mg/dL — ABNORMAL HIGH (ref 70–99)
Glucose-Capillary: 194 mg/dL — ABNORMAL HIGH (ref 70–99)
Glucose-Capillary: 178 mg/dL — ABNORMAL HIGH (ref 70–99)
Glucose-Capillary: 208 mg/dL — ABNORMAL HIGH (ref 70–99)

## 2023-06-30 NOTE — Progress Notes (Signed)
Occupational Therapy Treatment Patient Details Name: Justin [name] MRN: [id number] DOB: 1933-06-17 Today's Date: 06/30/2023   History of present illness 89 year old male admitted 06/19/23 with complaint of worsening ulcer on his left great toe.  Underwent aortogram and bilateral lower extremity angiography 06/22/23 and  L BKA on 06/24/23.  PMH positive for diabetes type 2, GERD, hypertension, hyperlipidemia, coronary disease status post CABG, peripheral artery disease, history of prostate cancer, CKD stage IIIb who presented to the emergency room with complaint of worsening ulcer on his left great toe.   OT comments  Pt in recliner upon therapy arrival. Pt states that he just worked on walking prior to OT arrival although agreeable to participate in BUE strengthening exercises. Pt education provided on BUE strengthening exercises utilizing [place] theraband in order to increase ability to complete functional transfers and sit <> stand transitions. Provided VC and visual demonstration for proper form and technique. Handout provided for reference. Reviewed handout with pt. Education provided on use of limb protector as pt was sitting up in recliner with it off. Verbal cues and visual demonstration provided on donning technique with pt verbalizing understanding. Discharge plan updated this session. Patient will benefit from continued inpatient follow up therapy, <3 hours/day. OT will continue to follow patient acutely.        If plan is discharge home, recommend the following:  A lot of help with walking and/or transfers;A lot of help with bathing/dressing/bathroom;Assist for transportation   Equipment Recommendations  None recommended by OT       Precautions / Restrictions Precautions Precautions: Fall Precaution Comments: L BKA Required Braces or Orthoses: Other Brace Other Brace: limb protector Restrictions Weight Bearing Restrictions: Yes LLE Weight Bearing: Non weight bearing               ADL either performed or assessed with clinical judgement      Cognition Arousal: Alert Behavior During Therapy: WFL for tasks assessed/performed Overall Cognitive Status: Within Functional Limits for tasks assessed             Exercises General Exercises - Upper Extremity Shoulder Extension: Strengthening, Both, 10 reps, Seated, Theraband Theraband Level (Shoulder Extension): Level 3 (Jentsch) Shoulder ADduction: Strengthening, Both, 10 reps, Seated, Theraband Theraband Level (Shoulder Adduction): Level 3 (Nick) Shoulder Horizontal ABduction: Strengthening, Both, 10 reps, Seated, Theraband Theraband Level (Shoulder Horizontal Abduction): Level 3 (Jobst) Shoulder Horizontal ADduction: Strengthening, Both, 10 reps, Seated, Theraband Theraband Level (Shoulder Horizontal Adduction): Level 3 (Loden) Shoulder Exercises Shoulder External Rotation: Strengthening, Both, 10 reps, Seated, Theraband Theraband Level (Shoulder External Rotation): Level 3 (Sprick)            Pertinent Vitals/ Pain       Pain Assessment Pain Assessment: No/denies pain         Frequency  Min 1X/week        Progress Toward Goals  OT Goals(current goals can now be found in the care plan section)  Progress towards OT goals: Progressing toward goals            AM-PAC OT "6 Clicks" Daily Activity     Outcome Measure   Help from another person eating meals?: None Help from another person taking care of personal grooming?: A Little Help from another person toileting, which includes using toliet, bedpan, or urinal?: A Lot Help from another person bathing (including washing, rinsing, drying)?: A Lot Help from another person to put on and taking off regular upper body clothing?: A Little Help from another person to  put on and taking off regular lower body clothing?: A Lot 6 Click Score: 16    End of Session    OT Visit Diagnosis: Unsteadiness on feet (R26.81);Muscle weakness (generalized)  (M62.81)   Activity Tolerance Patient tolerated treatment well   Patient Left in chair;with call bell/phone within reach;with nursing/sitter in room           Time: 1610-9604 OT Time Calculation (min): 18 min  Charges: OT General Charges $OT Visit: 1 Visit OT Treatments $Therapeutic Exercise: 8-22 mins  Limmie Patricia, OTR/L,CBIS  Supplemental OT - [facility] and [facility] Secure Chat Preferred    Cynde Menard, Charisse March 06/30/2023, 3:54 PM

## 2023-06-30 NOTE — Progress Notes (Signed)
Mobility Specialist: Progress Note   06/30/23 1557  Mobility  Activity Ambulated with assistance in room  Level of Assistance Minimal assist, patient does 75% or more  Assistive Device Front wheel walker  Distance Ambulated (ft) 13 ft  LLE Weight Bearing NWB  Activity Response Tolerated well  Mobility Referral Yes  $Mobility charge 1 Mobility  Mobility Specialist Start Time (ACUTE ONLY) 1423  Mobility Specialist Stop Time (ACUTE ONLY) 1435  Mobility Specialist Time Calculation (min) (ACUTE ONLY) 12 min    Pt was agreeable to mobility session - received in bed. SV for bed mobility, MinA for STS and ambulation. No complaints throughout. 1x occurrence of LOB during backwards steps to couch. Sat down on couch for a brief moment (~20sec), then decided to ambulate to chair and sit there instead. Left in chair with all needs met, call bell in reach.   MS returned around 330pm to transfer pt back to bed. MinA for STS, CG throughout ambulation, SV for bed mobility. No complaints throughout. Left in bed with all needs met, call bell in reach.   Maurene Capes Mobility Specialist Please contact via SecureChat or Rehab office at [phone]

## 2023-06-30 NOTE — Patient Instructions (Signed)
1) Strengthening: Chest Pull - Resisted   Hold Theraband in front of body with hands about shoulder width a part. Pull band a part and back together slowly. Repeat _10-15___ times. Complete _1___ set(s) per session.. Repeat __1__ session(s) per day.  http://orth.exer.us/926   Copyright  VHI. All rights reserved.   2) PNF Strengthening: Resisted   Standing with resistive band around each hand, bring right arm up and away, thumb back. Repeat _10-15___ times per set. Do _1___ sets per session. Do ___1_ sessions per day.   3) Resisted External Rotation: in Neutral - Bilateral   Sit or stand, tubing in both hands, elbows at sides, bent to 90, forearms forward. Pinch shoulder blades together and rotate forearms out. Keep elbows at sides. Repeat _10-15___ times per set. Do __1__ sets per session. Do _1___ sessions per day.  http://orth.exer.us/966   Copyright  VHI. All rights reserved.   4) PNF Strengthening: Resisted   Standing, hold resistive band above head. Bring right arm down and out from side. Repeat _10-15___ times per set. Do _1___ sets per session. Do __1__ sessions per day.  http://orth.exer.us/922   Copyright  VHI. All rights reserved.

## 2023-06-30 NOTE — Plan of Care (Signed)
  Problem: Coping: Goal: Ability to adjust to condition or change in health will improve Outcome: Progressing   Problem: Health Behavior/Discharge Planning: Goal: Ability to manage health-related needs will improve Outcome: Progressing   Problem: Metabolic: Goal: Ability to maintain appropriate glucose levels will improve Outcome: Progressing   Problem: Nutritional: Goal: Maintenance of adequate nutrition will improve Outcome: Progressing Goal: Progress toward achieving an optimal weight will improve Outcome: Progressing   Problem: Skin Integrity: Goal: Risk for impaired skin integrity will decrease Outcome: Progressing   Problem: Tissue Perfusion: Goal: Adequacy of tissue perfusion will improve Outcome: Progressing

## 2023-06-30 NOTE — TOC Initial Note (Signed)
Transition of Care [facility]) - Initial/Assessment Note    Patient Details  Name: Justin [name] MRN: [id number] Date of Birth: 03/08/1933  Transition of Care [facility]) CM/SW Contact:    Lorri Frederick, LCSW Phone Number: 06/30/2023, 12:00 PM  Clinical Narrative:    Pt sleeping when CSW arrived, did wake up but remained drowsy, all information from daughter Eber Jones.  Discussed CIR note being authorized, daughter is agreeable to SNF in stead, medicare choice document provided.  Would be interested in [place], [place], or [facility].  Pt from home with daughter, no current services.  Referral sent out in hub for SNF.                Expected Discharge Plan: Skilled Nursing Facility Barriers to Discharge: Continued Medical Work up, SNF Pending bed offer   Patient Goals and CMS Choice   CMS Medicare.gov Compare Post Acute Care list provided to:: Patient Represenative (must comment) Choice offered to / list presented to : Adult Children (daughter Eber Jones)      Expected Discharge Plan and Services In-house Referral: Clinical Social Work   Post Acute Care Choice: Skilled Nursing Facility Living arrangements for the past 2 months: Single Family Home                                      Prior Living Arrangements/Services Living arrangements for the past 2 months: Single Family Home Lives with:: Adult Children (with daughter Eber Jones) Patient language and need for interpreter reviewed:: Yes        Need for Family Participation in Patient Care: Yes (Comment) Care giver support system in place?: Yes (comment) Current home services: Other (comment) (none) Criminal Activity/Legal Involvement Pertinent to Current Situation/Hospitalization: No - Comment as needed  Activities of Daily Living Home Assistive Devices/Equipment: Walker (specify type) ADL Screening (condition at time of admission) Does the patient have a NEW difficulty with bathing/dressing/toileting/self-feeding that is  expected to last >3 days?: No Does the patient have a NEW difficulty with getting in/out of bed, walking, or climbing stairs that is expected to last >3 days?: No Does the patient have a NEW difficulty with communication that is expected to last >3 days?: No Is the patient deaf or have difficulty hearing?: Yes Does the patient have difficulty seeing, even when wearing glasses/contacts?: No Does the patient have difficulty concentrating, remembering, or making decisions?: No  Permission Sought/Granted                  Emotional Assessment Appearance:: Appears stated age Attitude/Demeanor/Rapport:  (drowsy) Affect (typically observed): Unable to Assess Orientation: : Oriented to Self, Oriented to Place, Oriented to  Time, Oriented to Situation      Admission diagnosis:  Osteomyelitis of left foot, unspecified type (HCC) [M86.9] Gangrene of extremity (HCC) [I96] Patient Active Problem List   Diagnosis Date Noted   Osteomyelitis of great toe of left foot (HCC) 06/20/2023   Gangrene of toe of left foot (HCC) 06/19/2023   Normocytic anemia 06/19/2023   Eschar of left great toe 05/22/2023   Near syncope 06/15/2021   Dizziness 06/15/2021   Lumbar stenosis with neurogenic claudication 11/09/2020   CKD stage 3 due to type 2 diabetes mellitus (HCC) 12/14/2019   Asymptomatic bilateral carotid artery stenosis 12/14/2019   Abdominal distention    CAD s/p CABG in 2009 12/14/2018   Acute renal failure superimposed on stage 3 chronic kidney disease (HCC)  12/14/2018   UTI (urinary tract infection) 12/14/2018   HLD (hyperlipidemia) 12/14/2018   Sepsis (HCC) 12/14/2018   Abnormal LFTs    S/P CABG (coronary artery bypass graft) 12/06/2018   Asymptomatic stenosis of right carotid artery 12/06/2018   Claudication in peripheral vascular disease (HCC) 12/06/2018   Orthostatic hypotension due to diabetic dysautonomia 12/06/2018   Elevated liver enzymes 12/06/2018   Abdominal pain 10/12/2018    Abnormal liver function tests 10/12/2018   Foreign body in stomach 10/12/2018   Primary hypertension    GERD (gastroesophageal reflux disease)    Diabetes mellitus without complication (HCC)    Atherosclerosis of native coronary artery of native heart without angina pectoris    PCP:  Estevan Oaks, NP Pharmacy:   [facility] [address] [place] - [address] [place] [address] Phone: [phone] Fax: [phone]     Social Determinants of Health (SDOH) Social History: SDOH Screenings   Food Insecurity: No Food Insecurity (06/20/2023)  Housing: Low Risk  (06/20/2023)  Transportation Needs: No Transportation Needs (06/20/2023)  Utilities: Not At Risk (06/20/2023)  Social Connections: Unknown (02/11/2022)   Received from [facility]  Tobacco Use: Medium Risk (06/24/2023)   SDOH Interventions:     Readmission Risk Interventions     No data to display

## 2023-06-30 NOTE — NC FL2 (Signed)
[place] MEDICAID FL2 LEVEL OF CARE FORM     IDENTIFICATION  Patient Name: Justin [name] Birthdate: Apr 22, 1933 Sex: male Admission Date (Current Location): 06/19/2023  [facility] and [place] Number:  Producer, television/film/video and Address:  [facility], [address] [place], [place] [address]      Provider Number: [id number]  Attending Physician Name and Address:  Dorcas Carrow, MD  Relative Name and Phone Number:  Brothers,Carolyn Daughter [phone]    Current Level of Care: Hospital Recommended Level of Care: Skilled Nursing Facility Prior Approval Number:    Date Approved/Denied:   PASRR Number: [id number]  Discharge Plan: SNF    Current Diagnoses: Patient Active Problem List   Diagnosis Date Noted   Osteomyelitis of great toe of left foot (HCC) 06/20/2023   Gangrene of toe of left foot (HCC) 06/19/2023   Normocytic anemia 06/19/2023   Eschar of left great toe 05/22/2023   Near syncope 06/15/2021   Dizziness 06/15/2021   Lumbar stenosis with neurogenic claudication 11/09/2020   CKD stage 3 due to type 2 diabetes mellitus (HCC) 12/14/2019   Asymptomatic bilateral carotid artery stenosis 12/14/2019   Abdominal distention    CAD s/p CABG in 2009 12/14/2018   Acute renal failure superimposed on stage 3 chronic kidney disease (HCC) 12/14/2018   UTI (urinary tract infection) 12/14/2018   HLD (hyperlipidemia) 12/14/2018   Sepsis (HCC) 12/14/2018   Abnormal LFTs    S/P CABG (coronary artery bypass graft) 12/06/2018   Asymptomatic stenosis of right carotid artery 12/06/2018   Claudication in peripheral vascular disease (HCC) 12/06/2018   Orthostatic hypotension due to diabetic dysautonomia 12/06/2018   Elevated liver enzymes 12/06/2018   Abdominal pain 10/12/2018   Abnormal liver function tests 10/12/2018   Foreign body in stomach 10/12/2018   Primary hypertension    GERD (gastroesophageal reflux disease)    Diabetes mellitus without  complication (HCC)    Atherosclerosis of native coronary artery of native heart without angina pectoris     Orientation RESPIRATION BLADDER Height & Weight     Self, Time, Situation, Place  Normal Continent Weight: 157 lb (71.2 kg) Height:  5\' 7"  (170.2 cm)  BEHAVIORAL SYMPTOMS/MOOD NEUROLOGICAL BOWEL NUTRITION STATUS      Continent Diet (see discharge summary)  AMBULATORY STATUS COMMUNICATION OF NEEDS Skin   Total Care Verbally Surgical wounds, Other (Comment) (ecchymosis)                       Personal Care Assistance Level of Assistance  Bathing, Feeding, Dressing Bathing Assistance: Limited assistance Feeding assistance: Limited assistance Dressing Assistance: Limited assistance     Functional Limitations Info  Sight, Hearing, Speech Sight Info: Adequate Hearing Info: Impaired Speech Info: Adequate    SPECIAL CARE FACTORS FREQUENCY  PT (By licensed PT), OT (By licensed OT)     PT Frequency: 5x week OT Frequency: 5x week            Contractures Contractures Info: Not present    Additional Factors Info  Code Status, Allergies Code Status Info: full Allergies Info: Contrast Media (Iodinated Contrast Media), Pletal (Cilostazol), Amlodipine, Dye Fdc Red (Red Dye #40 (Allura Red)), Ibuprofen           Current Medications (06/30/2023):  This is the current hospital active medication list Current Facility-Administered Medications  Medication Dose Route Frequency Provider Last Rate Last Admin   0.9 %  sodium chloride infusion  250 mL Intravenous PRN Lajoyce Corners,  Randa Evens, MD       0.9 %  sodium chloride infusion   Intravenous Continuous Nadara Mustard, MD       acetaminophen (TYLENOL) tablet 325-650 mg  325-650 mg Oral Q6H PRN Nadara Mustard, MD   650 mg at 06/30/23 0900   alum & mag hydroxide-simeth (MAALOX/MYLANTA) 200-200-20 MG/5ML suspension 15-30 mL  15-30 mL Oral Q2H PRN Nadara Mustard, MD       ascorbic acid (VITAMIN C) tablet 1,000 mg  1,000 mg Oral Daily  Nadara Mustard, MD   1,000 mg at 06/30/23 4098   aspirin chewable tablet 81 mg  81 mg Oral Daily Nadara Mustard, MD   81 mg at 06/30/23 0858   bisacodyl (DULCOLAX) EC tablet 5 mg  5 mg Oral Daily PRN Nadara Mustard, MD       clonazePAM Scarlette Calico) tablet 0.5 mg  0.5 mg Oral Daily PRN Nadara Mustard, MD   0.5 mg at 06/25/23 1018   DULoxetine (CYMBALTA) DR capsule 60 mg  60 mg Oral Daily Nadara Mustard, MD   60 mg at 06/30/23 0858   ezetimibe (ZETIA) tablet 10 mg  10 mg Oral QPM Nadara Mustard, MD   10 mg at 06/29/23 1808   furosemide (LASIX) tablet 40 mg  40 mg Oral Daily Nadara Mustard, MD   40 mg at 06/30/23 0858   gabapentin (NEURONTIN) capsule 200 mg  200 mg Oral TID Nadara Mustard, MD   200 mg at 06/30/23 0858   guaiFENesin-dextromethorphan (ROBITUSSIN DM) 100-10 MG/5ML syrup 15 mL  15 mL Oral Q4H PRN Nadara Mustard, MD       heparin injection 5,000 Units  5,000 Units Subcutaneous Q8H Nadara Mustard, MD   5,000 Units at 06/30/23 0449   hydrALAZINE (APRESOLINE) injection 5 mg  5 mg Intravenous Q8H PRN Nadara Mustard, MD       hydrALAZINE (APRESOLINE) injection 5 mg  5 mg Intravenous Q20 Min PRN Nadara Mustard, MD       spironolactone (ALDACTONE) tablet 12.5 mg  12.5 mg Oral Daily Adhikari, Amrit, MD   12.5 mg at 06/30/23 1191   And   hydrochlorothiazide (HYDRODIURIL) tablet 12.5 mg  12.5 mg Oral Daily Burnadette Pop, MD   12.5 mg at 06/30/23 0858   HYDROmorphone (DILAUDID) injection 0.5-1 mg  0.5-1 mg Intravenous Q4H PRN Nadara Mustard, MD       influenza vaccine adjuvanted (FLUAD) injection 0.5 mL  0.5 mL Intramuscular Tomorrow-1000 Nadara Mustard, MD       insulin aspart (novoLOG) injection 0-9 Units  0-9 Units Subcutaneous TID WC Nadara Mustard, MD   2 Units at 06/30/23 0859   insulin glargine-yfgn (SEMGLEE) injection 10 Units  10 Units Subcutaneous Daily Nadara Mustard, MD   10 Units at 06/30/23 0858   labetalol (NORMODYNE) injection 10 mg  10 mg Intravenous Q10 min PRN Nadara Mustard, MD        labetalol (NORMODYNE) tablet 100 mg  100 mg Oral BID Nadara Mustard, MD   100 mg at 06/30/23 0858   magnesium citrate solution 1 Bottle  1 Bottle Oral Once PRN Nadara Mustard, MD       magnesium sulfate IVPB 2 g 50 mL  2 g Intravenous Daily PRN Nadara Mustard, MD       meclizine (ANTIVERT) tablet 25 mg  25 mg Oral BID PRN Nadara Mustard, MD  metoprolol tartrate (LOPRESSOR) injection 2-5 mg  2-5 mg Intravenous Q2H PRN Nadara Mustard, MD       nutrition supplement (JUVEN) (JUVEN) powder packet 1 packet  1 packet Oral BID BM Nadara Mustard, MD   1 packet at 06/30/23 0858   ondansetron (ZOFRAN) tablet 4 mg  4 mg Oral Q6H PRN Nadara Mustard, MD       Or   ondansetron [facility]) injection 4 mg  4 mg Intravenous Q6H PRN Nadara Mustard, MD       oxyCODONE (Oxy IR/ROXICODONE) immediate release tablet 5-10 mg  5-10 mg Oral Q4H PRN Nadara Mustard, MD   5 mg at 06/26/23 0952   pantoprazole (PROTONIX) EC tablet 80 mg  80 mg Oral Daily Nadara Mustard, MD   80 mg at 06/30/23 0858   phenol (CHLORASEPTIC) mouth spray 1 spray  1 spray Mouth/Throat PRN Nadara Mustard, MD       pneumococcal 20-valent conjugate vaccine (PREVNAR 20) injection 0.5 mL  0.5 mL Intramuscular Tomorrow-1000 Nadara Mustard, MD       potassium chloride SA (KLOR-CON M) CR tablet 20-40 mEq  20-40 mEq Oral Daily PRN Nadara Mustard, MD       rOPINIRole (REQUIP) tablet 5 mg  5 mg Oral QHS Nadara Mustard, MD   5 mg at 06/28/23 2238   simvastatin (ZOCOR) tablet 20 mg  20 mg Oral QHS Nadara Mustard, MD   20 mg at 06/29/23 2138   sodium bicarbonate tablet 650 mg  650 mg Oral BID Burnadette Pop, MD   650 mg at 06/30/23 0858   sodium chloride flush (NS) 0.9 % injection 3 mL  3 mL Intravenous Q12H Nadara Mustard, MD   3 mL at 06/28/23 2157   sodium chloride flush (NS) 0.9 % injection 3 mL  3 mL Intravenous PRN Nadara Mustard, MD       tamsulosin [facility]) capsule 0.4 mg  0.4 mg Oral QPC supper Nadara Mustard, MD   0.4 mg at 06/29/23 1808   zinc  sulfate capsule 220 mg  220 mg Oral Daily Nadara Mustard, MD   220 mg at 06/30/23 1032     Discharge Medications: Please see discharge summary for a list of discharge medications.  Relevant Imaging Results:  Relevant Lab Results:   Additional Information SSN: [id number]  Lorri Frederick, LCSW

## 2023-06-30 NOTE — Progress Notes (Signed)
PROGRESS NOTE  Justin [name]  PIR:[id number] DOB: 1932/12/29 DOA: 06/19/2023 PCP: Estevan Oaks, NP   Brief Narrative: Patient is a 89 year old male with history of diabetes type 2, GERD, hypertension, hyperlipidemia, coronary disease status post CABG, peripheral artery disease, history of prostate cancer, CKD stage IIIb who presented to the emergency room with complaint of worsening ulcer on his left great toe. drainage of pus and blood when the dressing was being changed, had foul smell.  No history of fever or chills.  On presentation he was hypertensive, afebrile.  Lab work showed creatinine of 1.76, baseline creatinine of 1.4-1.5.  Left foot x-ray showed increased subcutaneous air within the distal aspect of the great toe suspicious for early osteomyelitis.  Vascular surgery, orthopedics consulted.  Underwent left lower extremity angiogram on 9/23 with finding of small vessel disease not amenable to intervention.  S/P transtibial amputation of the left on 9/25.  Medically stable.  CIR declined.  Waiting to go to a skilled nursing facility.   Assessment & Plan:  Principal Problem:   Gangrene of toe of left foot (HCC) Active Problems:   Claudication in peripheral vascular disease (HCC)   Normocytic anemia   CAD s/p CABG in 2009   CKD stage 3 due to type 2 diabetes mellitus (HCC)   Diabetes mellitus without complication (HCC)   Asymptomatic bilateral carotid artery stenosis   Primary hypertension   GERD (gastroesophageal reflux disease)   HLD (hyperlipidemia)   Osteomyelitis of great toe of left foot (HCC)  Gangrene of left lower extremity:  Failed conservative management.  With osteomyelitis and small vessel disease patient underwent transtibial amputation on 9/25.   Mobilizing with PT OT.   Pain is managed.  Hemoglobin stable.  Antibiotics completed.   Transfer to short-term rehab when bed available.    Peripheral vascular disease: Follows with Dr Jacinto Halim, now with  amputation.  Normocytic anemia: Likely from chronic medical conditions.    Iron of 26.  Given IV iron.    No evidence of acute blood loss except for surgery.  Transfused with  1 unit of PRBC on 9/26  Coronary artery disease: Status post CABG. no anginal Symptoms.  On Lipitor, aspirin, Zetia, Zocor at home  CKD stage IIIb/NAGMA: Baseline creatinine 1.4-1.6.  Follows with nephrology as an outpatient. Started sodium bicarb tablets  Diabetes type 2: A1c of 8.8.  Monitor blood sugars.  Continue  insulin.  Monitor blood sugars  Bilateral carotid artery stenosis: Follows with cardiology as outpatient.  Hypertension: Monitor blood pressure.  On labetalol, Lasix, spironolactone, hydrochlorothiazide at home.  Stable.  Hyperlipidemia: Continue zokor, Zetia  GERD: Continue PPI  Peripheral neuropathy: Complained of bilateral lower extremity discomfort.  Continue gabapentin, increased the dose to 200 mg 3 times a day with improvement    Medically stable.  Waiting for SNF bed.    DVT prophylaxis:SCD's Start: 06/24/23 1619 heparin injection 5,000 Units Start: 06/19/23 2200     Code Status: Full Code  Family Communication: Daughter at the bedside.  Patient status:Inpatient  Patient is from :Home  Anticipated discharge to: SNF when bed available.  Estimated DC date: Medically stable.   Consultants: vascular surgery, orthopedics  Procedures: Left transtibial amputation  Antimicrobials:  Anti-infectives (From admission, onward)    Start     Dose/Rate Route Frequency Ordered Stop   06/24/23 1715  ceFAZolin (ANCEF) IVPB 2g/100 mL premix  Status:  Discontinued        2 g 200 mL/hr over 30 Minutes Intravenous Every  8 hours 06/24/23 1619 06/24/23 1638   06/24/23 0600  ceFAZolin (ANCEF) IVPB 2g/100 mL premix        2 g 200 mL/hr over 30 Minutes Intravenous On call to O.R. 06/23/23 1614 06/24/23 1510   06/23/23 1800  vancomycin (VANCOCIN) IVPB 1000 mg/200 mL premix  Status:  Discontinued         1,000 mg 200 mL/hr over 60 Minutes Intravenous Every 36 hours 06/22/23 1153 06/25/23 1203   06/21/23 1000  vancomycin (VANCOREADY) IVPB 750 mg/150 mL  Status:  Discontinued        750 mg 150 mL/hr over 60 Minutes Intravenous Every 24 hours 06/20/23 0907 06/22/23 1153   06/20/23 1000  vancomycin (VANCOREADY) IVPB 500 mg/100 mL        500 mg 100 mL/hr over 60 Minutes Intravenous  Once 06/20/23 0907 06/20/23 1118   06/19/23 2000  metroNIDAZOLE (FLAGYL) IVPB 500 mg        500 mg 100 mL/hr over 60 Minutes Intravenous Every 12 hours 06/19/23 1805 06/25/23 2259   06/19/23 1930  cefTRIAXone (ROCEPHIN) 2 g in sodium chloride 0.9 % 100 mL IVPB        2 g 200 mL/hr over 30 Minutes Intravenous Every 24 hours 06/19/23 1839 06/25/23 1634   06/19/23 1900  cefTRIAXone (ROCEPHIN) 1 g in sodium chloride 0.9 % 100 mL IVPB  Status:  Discontinued        1 g 200 mL/hr over 30 Minutes Intravenous Every 24 hours 06/19/23 1805 06/19/23 1832   06/19/23 1630  vancomycin (VANCOCIN) IVPB 1000 mg/200 mL premix        1,000 mg 200 mL/hr over 60 Minutes Intravenous  Once 06/19/23 1615 06/19/23 1800       Subjective:  Patient seen and examined.  No overnight events.  Denies any complaints.  Occasional discomfort on the knee extender.  Wound VAC canister is clear.  Daughter at the bedside. Patient tells me he feels much better after his infection is eradicated. Called and discussed with Dr. Lajoyce Corners, he will inspect the wound and remove wound VAC before discharge.  Objective: Vitals:   06/29/23 1950 06/29/23 2136 06/30/23 0437 06/30/23 0729  BP: (!) 164/59 (!) 162/72 (!) 143/48 (!) 172/61  Pulse: 63 65 (!) 59 65  Resp:    18  Temp: 98.3 F (36.8 C)  98.4 F (36.9 C) 98.5 F (36.9 C)  TempSrc: Oral  Oral Oral  SpO2: 100%  97% 100%  Weight:      Height:        Intake/Output Summary (Last 24 hours) at 06/30/2023 1312 Last data filed at 06/30/2023 0930 Gross per 24 hour  Intake 240 ml  Output 700 ml   Net -460 ml    Filed Weights   06/19/23 1315  Weight: 71.2 kg    Examination:  General: Looks comfortable.  Interactive.  Pleasant to conversation. Cardiovascular: S1-S2 normal. Respiratory: Bilateral clear Gastrointestinal: Soft and nontender Ext: Left below-knee amputation on knee extended, dressing applied.  Wound VAC in place.    Data Reviewed: I have personally reviewed following labs and imaging studies  CBC: Recent Labs  Lab 06/24/23 0630 06/25/23 0347 06/26/23 1845 06/27/23 0310  WBC 14.2* 16.5* 12.9* 11.7*  HGB 7.2* 7.2* 8.4* 8.7*  HCT 22.4* 22.3* 26.3* 27.1*  MCV 83.9 84.2 82.7 82.1  PLT 214 219 229 245   Basic Metabolic Panel: Recent Labs  Lab 06/24/23 0630 06/25/23 0347 06/26/23 1845 06/27/23 0310  NA 134*  133* 134* 134*  K 3.4* 4.5 4.0 4.1  CL 103 105 103 104  CO2 23 20* 22 25  GLUCOSE 162* 263* 224* 178*  BUN 31* 30* 43* 39*  CREATININE 1.83* 1.61* 1.47* 1.34*  CALCIUM 8.1* 8.1* 8.4* 8.4*     Recent Results (from the past 240 hour(s))  Surgical pcr screen     Status: None   Collection Time: 06/23/23  6:26 PM   Specimen: Nasal Mucosa; Nasal Swab  Result Value Ref Range Status   MRSA, PCR NEGATIVE NEGATIVE Final   Staphylococcus aureus NEGATIVE NEGATIVE Final    Comment: (NOTE) The Xpert SA Assay (FDA approved for NASAL specimens in patients 88 years of age and older), is one component of a comprehensive surveillance program. It is not intended to diagnose infection nor to guide or monitor treatment. Performed at [facility], [address], [place], [place] [address]      Radiology Studies: No results found.  Scheduled Meds:  vitamin C  1,000 mg Oral Daily   aspirin  81 mg Oral Daily   DULoxetine  60 mg Oral Daily   ezetimibe  10 mg Oral QPM   furosemide  40 mg Oral Daily   gabapentin  200 mg Oral TID   heparin injection (subcutaneous)  5,000 Units Subcutaneous Q8H   spironolactone  12.5 mg Oral Daily   And    hydrochlorothiazide  12.5 mg Oral Daily   influenza vaccine adjuvanted  0.5 mL Intramuscular Tomorrow-1000   insulin aspart  0-9 Units Subcutaneous TID WC   insulin glargine-yfgn  10 Units Subcutaneous Daily   labetalol  100 mg Oral BID   nutrition supplement (JUVEN)  1 packet Oral BID BM   pantoprazole  80 mg Oral Daily   pneumococcal 20-valent conjugate vaccine  0.5 mL Intramuscular Tomorrow-1000   ropinirole  5 mg Oral QHS   simvastatin  20 mg Oral QHS   sodium bicarbonate  650 mg Oral BID   sodium chloride flush  3 mL Intravenous Q12H   tamsulosin  0.4 mg Oral QPC supper   zinc sulfate  220 mg Oral Daily   Continuous Infusions:  sodium chloride     sodium chloride     magnesium sulfate bolus IVPB       LOS: 11 days   Dorcas Carrow, MD [facility] P10/09/2022, 1:12 PM

## 2023-07-01 DIAGNOSIS — E785 Hyperlipidemia, unspecified: Secondary | ICD-10-CM

## 2023-07-01 DIAGNOSIS — I25118 Atherosclerotic heart disease of native coronary artery with other forms of angina pectoris: Secondary | ICD-10-CM

## 2023-07-01 DIAGNOSIS — M869 Osteomyelitis, unspecified: Secondary | ICD-10-CM | POA: Diagnosis not present

## 2023-07-01 DIAGNOSIS — D649 Anemia, unspecified: Secondary | ICD-10-CM | POA: Diagnosis not present

## 2023-07-01 DIAGNOSIS — E119 Type 2 diabetes mellitus without complications: Secondary | ICD-10-CM

## 2023-07-01 DIAGNOSIS — I10 Essential (primary) hypertension: Secondary | ICD-10-CM

## 2023-07-01 DIAGNOSIS — I96 Gangrene, not elsewhere classified: Secondary | ICD-10-CM | POA: Diagnosis not present

## 2023-07-01 DIAGNOSIS — I6523 Occlusion and stenosis of bilateral carotid arteries: Secondary | ICD-10-CM

## 2023-07-01 DIAGNOSIS — K219 Gastro-esophageal reflux disease without esophagitis: Secondary | ICD-10-CM

## 2023-07-01 LAB — GLUCOSE, CAPILLARY
Glucose-Capillary: 174 mg/dL — ABNORMAL HIGH (ref 70–99)
Glucose-Capillary: 285 mg/dL — ABNORMAL HIGH (ref 70–99)

## 2023-07-01 MED ORDER — ASCORBIC ACID 1000 MG PO TABS: 1000.0000 mg | ORAL_TABLET | Freq: Every day | ORAL | Status: DC

## 2023-07-01 MED ORDER — ZINC SULFATE 220 (50 ZN) MG PO CAPS: 220.0000 mg | ORAL_CAPSULE | Freq: Every day | ORAL | Status: DC

## 2023-07-01 MED ORDER — LANTUS SOLOSTAR 100 UNIT/ML ~~LOC~~ SOPN: 12.0000 [IU] | PEN_INJECTOR | Freq: Every morning | SUBCUTANEOUS | Status: AC

## 2023-07-01 MED ORDER — ACETAMINOPHEN 500 MG PO TABS: ORAL_TABLET | ORAL | Status: AC

## 2023-07-01 MED ORDER — HYDRALAZINE HCL 25 MG PO TABS: 25.0000 mg | ORAL_TABLET | Freq: Three times a day (TID) | ORAL | Status: DC

## 2023-07-01 MED ORDER — CLONAZEPAM 0.5 MG PO TABS: 0.5000 mg | ORAL_TABLET | Freq: Every day | ORAL | 0 refills | Status: AC | PRN

## 2023-07-01 MED ORDER — HYDRALAZINE HCL 25 MG PO TABS
25.0000 mg | ORAL_TABLET | Freq: Four times a day (QID) | ORAL | Status: AC
Start: 2023-07-01 — End: 2024-06-30

## 2023-07-01 MED ORDER — OXYCODONE HCL 5 MG PO TABS
10.0000 mg | ORAL_TABLET | Freq: Four times a day (QID) | ORAL | 0 refills | Status: AC | PRN
Start: 2023-07-01 — End: 2023-07-08

## 2023-07-01 MED ORDER — SENNOSIDES-DOCUSATE SODIUM 8.6-50 MG PO TABS: 2.0000 | ORAL_TABLET | Freq: Two times a day (BID) | ORAL | Status: DC | PRN

## 2023-07-01 MED ORDER — GABAPENTIN 100 MG PO CAPS: 100.0000 mg | ORAL_CAPSULE | Freq: Three times a day (TID) | ORAL | 0 refills | Status: AC

## 2023-07-01 NOTE — Care Management Important Message (Signed)
Important Message  Patient Details  Name: Justin [name] MRN: [id number] Date of Birth: January 01, 1933   Important Message Given:  Yes - Medicare IM     Sherilyn Banker 07/01/2023, 1:10 PM

## 2023-07-01 NOTE — TOC Transition Note (Addendum)
Transition of Care [facility]) - CM/SW Discharge Note   Patient Details  Name: Justin [name] MRN: [id number] Date of Birth: 04-28-1933  [facility]) CM/SW Contact:  Lorri Frederick, LCSW Phone Number: 07/01/2023, 12:05 PM   Clinical Narrative:   Pt discharging to [facility], room 410a.  RN call report to [phone].    Pt daughter Eber Jones will be transporting and will need pt brought down to main entrance with assistance getting into the vehicle.   Final next level of care: Skilled Nursing Facility Barriers to Discharge: Barriers Resolved   Patient Goals and CMS Choice CMS Medicare.gov Compare Post Acute Care list provided to:: Patient Represenative (must comment) Choice offered to / list presented to : Adult Children (daughter Eber Jones)  Discharge Placement                Patient chooses bed at: [facility] Patient to be transferred to facility by: daughter Eber Jones Name of family member notified: daughter Eber Jones Patient and family notified of of transfer: 07/01/23  Discharge Plan and Services Additional resources added to the After Visit Summary for   In-house Referral: Clinical Social Work   Post Acute Care Choice: Skilled Nursing Facility                               Social Determinants of Health (SDOH) Interventions SDOH Screenings   Food Insecurity: No Food Insecurity (06/20/2023)  Housing: Low Risk  (06/20/2023)  Transportation Needs: No Transportation Needs (06/20/2023)  Utilities: Not At Risk (06/20/2023)  Social Connections: Unknown (02/11/2022)   Received from [facility]  Tobacco Use: Medium Risk (06/24/2023)     Readmission Risk Interventions     No data to display

## 2023-07-01 NOTE — TOC Progression Note (Addendum)
Transition of Care [facility]) - Progression Note    Patient Details  Name: Justin [name] MRN: [id number] Date of Birth: [birth year]  [facility]) CM/SW Contact  Lorri Frederick, LCSW Phone Number: 07/01/2023, 8:39 AM  Clinical Narrative:   Pt son on the floor, provided bed offers and he spoke with his sister, they do want to accept offer at [facility].    CSW confirmed with [facility]: they can receive pt today.  Auth request submitted in [place].  1015: SNF auth approved: [id number], 3 days: 10/2-10/4.  1130: TC daughter Eber Jones.  CSW updated on DC today, discussed transportation.  She called her brother, called back, and can provide transport, will be here shortly.    Expected Discharge Plan: Skilled Nursing Facility Barriers to Discharge: Continued Medical Work up, SNF Pending bed offer  Expected Discharge Plan and Services In-house Referral: Clinical Social Work   Post Acute Care Choice: Skilled Nursing Facility Living arrangements for the past 2 months: Single Family Home Expected Discharge Date: 07/01/23                                     Social Determinants of Health (SDOH) Interventions SDOH Screenings   Food Insecurity: No Food Insecurity (06/20/2023)  Housing: Low Risk  (06/20/2023)  Transportation Needs: No Transportation Needs (06/20/2023)  Utilities: Not At Risk (06/20/2023)  Social Connections: Unknown (02/11/2022)   Received from [facility], [facility]  Tobacco Use: Medium Risk (06/24/2023)    Readmission Risk Interventions     No data to display

## 2023-07-01 NOTE — Progress Notes (Signed)
Physical Therapy Treatment Patient Details Name: Justin [name] MRN: [id number] DOB: 1933-01-21 Today's Date: 07/01/2023   History of Present Illness 89 year old male admitted 06/19/23 with complaint of worsening ulcer on his left great toe.  Underwent aortogram and bilateral lower extremity angiography 06/22/23 and  L BKA on 06/24/23.  PMH positive for diabetes type 2, GERD, hypertension, hyperlipidemia, coronary disease status post CABG, peripheral artery disease, history of prostate cancer, CKD stage IIIb who presented to the emergency room with complaint of worsening ulcer on his left great toe.    PT Comments  Pt seen for PT tx with pt agreeable, grandson present for first half of session. Pt motivated to participate. Pt is able to ambulate in room with RW & min assist with seated rest break 2/2 fatigue. After 2nd ambulation trial pt fatigues quickly & requires assistance/cuing to safely sit in seat as pt too fatigued to fully turn to surface. Continue to recommend ongoing PT services to maximize independence with mobility & reduce fall risk & caregiver burden.    If plan is discharge home, recommend the following: A little help with walking and/or transfers;A little help with bathing/dressing/bathroom;Assistance with cooking/housework;Assist for transportation;Help with stairs or ramp for entrance   Can travel by private vehicle     Yes  Equipment Recommendations  Other (comment) (defer to next venue)    Recommendations for Other Services       Precautions / Restrictions Precautions Precautions: Fall Precaution Comments: L BKA Required Braces or Orthoses: Other Brace Other Brace: limb protector Restrictions Weight Bearing Restrictions: Yes LLE Weight Bearing: Non weight bearing     Mobility  Bed Mobility Overal bed mobility: Needs Assistance Bed Mobility: Supine to Sit     Supine to sit: Supervision, HOB elevated, Used rails          Transfers Overall transfer level:  Needs assistance Equipment used: Rolling walker (2 wheels) Transfers: Sit to/from Stand Sit to Stand: Min assist   Step pivot transfers: Min assist (bed>recliner on L with RW)       General transfer comment: STS from various surfaces (EOB, recliner, low couch) with min assist. PT provides cuing re: hand placement during stand>sit (reach back for armrest with RUE 2/2 when pt reached back with LUE pt with decreased eccentric control requiring assistance to safely lower to seat).    Ambulation/Gait Ambulation/Gait assistance: Min assist Gait Distance (Feet): 10 Feet (+ 10 ft) Assistive device: Rolling walker (2 wheels) Gait Pattern/deviations: Step-to pattern, Decreased stride length, Decreased dorsiflexion - right, Decreased step length - right Gait velocity: decreased     General Gait Details: decreased foot clearance RLE, good use of BUE to offload LE   Stairs             Wheelchair Mobility     Tilt Bed    Modified Rankin (Stroke Patients Only)       Balance Overall balance assessment: Needs assistance Sitting-balance support: Feet supported Sitting balance-Leahy Scale: Fair Sitting balance - Comments: sitting EOB   Standing balance support: Reliant on assistive device for balance, Bilateral upper extremity supported, During functional activity Standing balance-Leahy Scale: Poor                              Cognition Arousal: Alert Behavior During Therapy: WFL for tasks assessed/performed Overall Cognitive Status: Within Functional Limits for tasks assessed  Exercises      General Comments General comments (skin integrity, edema, etc.): PT educated pt & grandson on purpose/use of limb guard.      Pertinent Vitals/Pain Pain Assessment Pain Assessment: No/denies pain (Pt initially reports discomfort 2/2 limb guard but when PT offers to readjust it pt reports it feels okay.)     Home Living                          Prior Function            PT Goals (current goals can now be found in the care plan section) Acute Rehab PT Goals Patient Stated Goal: return home, get prosthetic PT Goal Formulation: With patient Time For Goal Achievement: 07/09/23 Potential to Achieve Goals: Good Additional Goals Additional Goal #1: Patient to propel wheelchair 100' with S on level indoor surfaces. Progress towards PT goals: Progressing toward goals    Frequency    Min 1X/week      PT Plan      Co-evaluation              AM-PAC PT "6 Clicks" Mobility   Outcome Measure  Help needed turning from your back to your side while in a flat bed without using bedrails?: A Little Help needed moving from lying on your back to sitting on the side of a flat bed without using bedrails?: A Little Help needed moving to and from a bed to a chair (including a wheelchair)?: A Little Help needed standing up from a chair using your arms (e.g., wheelchair or bedside chair)?: A Little Help needed to walk in hospital room?: A Lot Help needed climbing 3-5 steps with a railing? : Total 6 Click Score: 15    End of Session Equipment Utilized During Treatment: Gait belt (LLE limb guard) Activity Tolerance: Patient limited by fatigue;Patient tolerated treatment well Patient left: in chair;with call bell/phone within reach (notified nursing staff of pt not having Simer chair alarm box in room, reviewed need for pt to call for staff assistance when wanting to transfer out of recliner & pt verbalized understanding) Nurse Communication: Mobility status PT Visit Diagnosis: Other abnormalities of gait and mobility (R26.89);Muscle weakness (generalized) (M62.81);Unsteadiness on feet (R26.81);Difficulty in walking, not elsewhere classified (R26.2)     Time: 8295-6213 PT Time Calculation (min) (ACUTE ONLY): 17 min  Charges:    $Therapeutic Activity: 8-22 mins PT General  Charges $$ ACUTE PT VISIT: 1 Visit                     Aleda Grana, PT, DPT 07/01/23, 8:58 AM   Justin [name] 07/01/2023, 8:57 AM

## 2023-07-01 NOTE — Discharge Summary (Signed)
Physician Discharge Summary  Justin [name] ZOX:[id number] DOB: 05/19/1933 DOA: 06/19/2023  PCP: Estevan Oaks, NP  Admit date: 06/19/2023 Discharge date: 07/01/2023 Admitted From: Home Disposition: SNF Recommendations for Outpatient Follow-up:  Outpatient follow-up with orthopedic surgery as below Check CMP and CBC at follow-up Please follow up on the following pending results: None  Discharge Condition: Stable CODE STATUS: Full code  Follow-up Information     Nadara Mustard, MD Follow up in 1 week(s).   Specialty: Orthopedic Surgery Contact information: [address] [place] [address] [phone]                 Hospital course 89 year old male with history of diabetes type 2, GERD, hypertension, hyperlipidemia, coronary disease status post CABG, peripheral artery disease, history of prostate cancer, CKD stage IIIb who presented to the emergency room with complaint of worsening ulcer on his left great toe. drainage of pus and blood when the dressing was being changed, had foul smell.  No history of fever or chills.  On presentation he was hypertensive, afebrile.  Lab work showed creatinine of 1.76, baseline creatinine of 1.4-1.5.  Left foot x-ray showed increased subcutaneous air within the distal aspect of the great toe suspicious for early osteomyelitis.  Vascular surgery, orthopedics consulted.  Underwent left lower extremity angiogram on 9/23 with finding of small vessel disease not amenable to intervention.  Left BKA recommended.  Patient underwent left BKA on 9/25 by Dr. Lajoyce Corners.  Therapy recommended CIR but CIR declined.  Discharged to SNF for ongoing therapy.  Outpatient follow-up with orthopedic surgery as above.  See individual problem list below for more.   Problems addressed during this hospitalization Principal Problem:   Gangrene of toe of left foot (HCC) Active Problems:   Claudication in peripheral vascular disease (HCC)   Normocytic anemia    CAD s/p CABG in 2009   CKD stage 3 due to type 2 diabetes mellitus (HCC)   Diabetes mellitus without complication (HCC)   Asymptomatic bilateral carotid artery stenosis   Primary hypertension   GERD (gastroesophageal reflux disease)   HLD (hyperlipidemia)   Osteomyelitis of great toe of left foot (HCC)   Gangrene of left lower extremity: Failed conservative management.  With osteomyelitis and small vessel disease patient underwent transtibial amputation on 9/25 with effective source control.   -Continue PT/OT at SNF -Pain control with Tylenol and oxycodone with bowel regimen -Outpatient follow-up with orthopedic surgery   Peripheral vascular disease: Follows with Dr Jacinto Halim, now with amputation. -Continue low-dose Xarelto and aspirin per Dr. Jacinto Halim   Normocytic anemia: Likely from chronic medical conditions.    Iron of 26.  Given IV iron.    No evidence of acute blood loss except for surgery.  Transfused with  1 unit of PRBC on 9/26.  Hgb improved to 8.7. -Repeat CBC in 1 to 2 weeks   Coronary artery disease: Status post CABG. no anginal Symptoms.  On Lipitor, aspirin, Zetia, Zocor at home   CKD stage IIIb/NAGMA: Baseline creatinine 1.4-1.6.  Follows with nephrology as an outpatient.  Acidosis resolved.   Diabetes type 2: A1c of 8.8%. -Increased home Lantus from 10 to 12 units -Continue carb modified diet   Bilateral carotid artery stenosis:  -Statin, Xarelto and aspirin as above. -Patient follow-up with cardiology   Hypertension: BP slightly elevated this morning  -Continue home meds -Added low-dose hydralazine. -Reassess and adjust as appropriate   Hyperlipidemia: Continue zokor, Zetia   GERD: Continue PPI   Peripheral  40 MG tablet Commonly known as: LASIX Take 40 mg by mouth daily.   gabapentin 100 MG capsule Commonly known as: NEURONTIN Take 1 capsule (100 mg total) by mouth 3 (three) times daily.   hydrALAZINE 25 MG tablet Commonly known as: APRESOLINE Take 1 tablet (25 mg total) by mouth 4 (four) times daily.   labetalol 100 MG tablet Commonly known as: NORMODYNE Take 1 tablet (100 mg total) by mouth 2 (two) times daily.   Lantus SoloStar 100 UNIT/ML Solostar Pen Generic drug: insulin glargine Inject 12 Units into the skin in the morning. What changed: how much to take   meclizine 25 MG tablet Commonly known as: ANTIVERT Take 25 mg by mouth every 6 (six) hours as needed for dizziness.   nitroGLYCERIN 0.2 mg/hr patch Commonly known as: NITRODUR - Dosed in mg/24 hr Place 1 patch (0.2 mg total) onto the skin daily. What changed: additional instructions   omeprazole 40 MG capsule Commonly known as: PRILOSEC Take 40 mg by mouth daily.   oxyCODONE 5 MG immediate release tablet Commonly known as: Roxicodone Take 2 tablets (10 mg total) by mouth every 6 (six) hours as needed for up to 7 days.    rivaroxaban 2.5 MG Tabs tablet Commonly known as: XARELTO Take 1 tablet (2.5 mg total) by mouth 2 (two) times daily.   ropinirole 5 MG tablet Commonly known as: REQUIP Take 5 mg by mouth at bedtime.   senna-docusate 8.6-50 MG tablet Commonly known as: Senokot-S Take 2 tablets by mouth 2 (two) times daily between meals as needed for mild constipation.   simvastatin 20 MG tablet Commonly known as: ZOCOR Take 20 mg by mouth at bedtime.   spironolactone-hydrochlorothiazide 25-25 MG tablet Commonly known as: ALDACTAZIDE TAKE 1/2 (ONE-HALF) TABLET BY MOUTH IN THE MORNING   tamsulosin 0.4 MG Caps capsule Commonly known as: FLOMAX TAKE 1 CAPSULE BY MOUTH ONCE DAILY AFTER SUPPER   Vitamin E 268 MG (400 UNIT) Caps Take 400 Units by mouth daily.   zinc sulfate 220 (50 Zn) MG capsule Take 1 capsule (220 mg total) by mouth daily.               Discharge Care Instructions  (From admission, onward)           Start     Ordered   07/01/23 0000  Discharge wound care:       Comments: Per Orthopedic surgery   07/01/23 0758            Consultations: Orthopedic surgery Vascular surgery  Procedures/Studies: 9/23-bilateral lower extremity angiography.  No intervention. 9/25-left BKA   PERIPHERAL VASCULAR CATHETERIZATION  Result Date: 06/22/2023 Images from the original result were not included. Patient name: PHELAN SCHADT MRN: [id number] DOB: 06-05-1933 Sex: male 06/22/2023 Pre-operative Diagnosis: Atherosclerosis native arteries with left great toe gangrene Post-operative diagnosis:  Same Surgeon:  Luanna Salk. Randie Heinz, MD Procedure Performed: 1.  Percutaneous ultrasound guided access and Mynx device closure right common femoral artery 2.  Catheter aorta with aortogram and bilateral lower extremity angiography 3.  Selection with catheter of left popliteal artery Indications: 89 year old male with history of peripheral arterial disease now with left great toe gangrene likely  will require amputation with monophasic ABIs indicated for angiography. Findings: The aorta and iliac segments are free of flow-limiting stenosis although they are calcified.  Bilateral common femoral arteries and proximal SFAs are calcified.  On the left side the SFA is heavily calcified throughout without flow-limiting stenosis in the popliteal  Physician Discharge Summary  Justin [name] ZOX:[id number] DOB: 05/19/1933 DOA: 06/19/2023  PCP: Estevan Oaks, NP  Admit date: 06/19/2023 Discharge date: 07/01/2023 Admitted From: Home Disposition: SNF Recommendations for Outpatient Follow-up:  Outpatient follow-up with orthopedic surgery as below Check CMP and CBC at follow-up Please follow up on the following pending results: None  Discharge Condition: Stable CODE STATUS: Full code  Follow-up Information     Nadara Mustard, MD Follow up in 1 week(s).   Specialty: Orthopedic Surgery Contact information: [address] [place] [address] [phone]                 Hospital course 89 year old male with history of diabetes type 2, GERD, hypertension, hyperlipidemia, coronary disease status post CABG, peripheral artery disease, history of prostate cancer, CKD stage IIIb who presented to the emergency room with complaint of worsening ulcer on his left great toe. drainage of pus and blood when the dressing was being changed, had foul smell.  No history of fever or chills.  On presentation he was hypertensive, afebrile.  Lab work showed creatinine of 1.76, baseline creatinine of 1.4-1.5.  Left foot x-ray showed increased subcutaneous air within the distal aspect of the great toe suspicious for early osteomyelitis.  Vascular surgery, orthopedics consulted.  Underwent left lower extremity angiogram on 9/23 with finding of small vessel disease not amenable to intervention.  Left BKA recommended.  Patient underwent left BKA on 9/25 by Dr. Lajoyce Corners.  Therapy recommended CIR but CIR declined.  Discharged to SNF for ongoing therapy.  Outpatient follow-up with orthopedic surgery as above.  See individual problem list below for more.   Problems addressed during this hospitalization Principal Problem:   Gangrene of toe of left foot (HCC) Active Problems:   Claudication in peripheral vascular disease (HCC)   Normocytic anemia    CAD s/p CABG in 2009   CKD stage 3 due to type 2 diabetes mellitus (HCC)   Diabetes mellitus without complication (HCC)   Asymptomatic bilateral carotid artery stenosis   Primary hypertension   GERD (gastroesophageal reflux disease)   HLD (hyperlipidemia)   Osteomyelitis of great toe of left foot (HCC)   Gangrene of left lower extremity: Failed conservative management.  With osteomyelitis and small vessel disease patient underwent transtibial amputation on 9/25 with effective source control.   -Continue PT/OT at SNF -Pain control with Tylenol and oxycodone with bowel regimen -Outpatient follow-up with orthopedic surgery   Peripheral vascular disease: Follows with Dr Jacinto Halim, now with amputation. -Continue low-dose Xarelto and aspirin per Dr. Jacinto Halim   Normocytic anemia: Likely from chronic medical conditions.    Iron of 26.  Given IV iron.    No evidence of acute blood loss except for surgery.  Transfused with  1 unit of PRBC on 9/26.  Hgb improved to 8.7. -Repeat CBC in 1 to 2 weeks   Coronary artery disease: Status post CABG. no anginal Symptoms.  On Lipitor, aspirin, Zetia, Zocor at home   CKD stage IIIb/NAGMA: Baseline creatinine 1.4-1.6.  Follows with nephrology as an outpatient.  Acidosis resolved.   Diabetes type 2: A1c of 8.8%. -Increased home Lantus from 10 to 12 units -Continue carb modified diet   Bilateral carotid artery stenosis:  -Statin, Xarelto and aspirin as above. -Patient follow-up with cardiology   Hypertension: BP slightly elevated this morning  -Continue home meds -Added low-dose hydralazine. -Reassess and adjust as appropriate   Hyperlipidemia: Continue zokor, Zetia   GERD: Continue PPI   Peripheral  40 MG tablet Commonly known as: LASIX Take 40 mg by mouth daily.   gabapentin 100 MG capsule Commonly known as: NEURONTIN Take 1 capsule (100 mg total) by mouth 3 (three) times daily.   hydrALAZINE 25 MG tablet Commonly known as: APRESOLINE Take 1 tablet (25 mg total) by mouth 4 (four) times daily.   labetalol 100 MG tablet Commonly known as: NORMODYNE Take 1 tablet (100 mg total) by mouth 2 (two) times daily.   Lantus SoloStar 100 UNIT/ML Solostar Pen Generic drug: insulin glargine Inject 12 Units into the skin in the morning. What changed: how much to take   meclizine 25 MG tablet Commonly known as: ANTIVERT Take 25 mg by mouth every 6 (six) hours as needed for dizziness.   nitroGLYCERIN 0.2 mg/hr patch Commonly known as: NITRODUR - Dosed in mg/24 hr Place 1 patch (0.2 mg total) onto the skin daily. What changed: additional instructions   omeprazole 40 MG capsule Commonly known as: PRILOSEC Take 40 mg by mouth daily.   oxyCODONE 5 MG immediate release tablet Commonly known as: Roxicodone Take 2 tablets (10 mg total) by mouth every 6 (six) hours as needed for up to 7 days.    rivaroxaban 2.5 MG Tabs tablet Commonly known as: XARELTO Take 1 tablet (2.5 mg total) by mouth 2 (two) times daily.   ropinirole 5 MG tablet Commonly known as: REQUIP Take 5 mg by mouth at bedtime.   senna-docusate 8.6-50 MG tablet Commonly known as: Senokot-S Take 2 tablets by mouth 2 (two) times daily between meals as needed for mild constipation.   simvastatin 20 MG tablet Commonly known as: ZOCOR Take 20 mg by mouth at bedtime.   spironolactone-hydrochlorothiazide 25-25 MG tablet Commonly known as: ALDACTAZIDE TAKE 1/2 (ONE-HALF) TABLET BY MOUTH IN THE MORNING   tamsulosin 0.4 MG Caps capsule Commonly known as: FLOMAX TAKE 1 CAPSULE BY MOUTH ONCE DAILY AFTER SUPPER   Vitamin E 268 MG (400 UNIT) Caps Take 400 Units by mouth daily.   zinc sulfate 220 (50 Zn) MG capsule Take 1 capsule (220 mg total) by mouth daily.               Discharge Care Instructions  (From admission, onward)           Start     Ordered   07/01/23 0000  Discharge wound care:       Comments: Per Orthopedic surgery   07/01/23 0758            Consultations: Orthopedic surgery Vascular surgery  Procedures/Studies: 9/23-bilateral lower extremity angiography.  No intervention. 9/25-left BKA   PERIPHERAL VASCULAR CATHETERIZATION  Result Date: 06/22/2023 Images from the original result were not included. Patient name: PHELAN SCHADT MRN: [id number] DOB: 06-05-1933 Sex: male 06/22/2023 Pre-operative Diagnosis: Atherosclerosis native arteries with left great toe gangrene Post-operative diagnosis:  Same Surgeon:  Luanna Salk. Randie Heinz, MD Procedure Performed: 1.  Percutaneous ultrasound guided access and Mynx device closure right common femoral artery 2.  Catheter aorta with aortogram and bilateral lower extremity angiography 3.  Selection with catheter of left popliteal artery Indications: 89 year old male with history of peripheral arterial disease now with left great toe gangrene likely  will require amputation with monophasic ABIs indicated for angiography. Findings: The aorta and iliac segments are free of flow-limiting stenosis although they are calcified.  Bilateral common femoral arteries and proximal SFAs are calcified.  On the left side the SFA is heavily calcified throughout without flow-limiting stenosis in the popliteal  Physician Discharge Summary  Justin [name] ZOX:[id number] DOB: 05/19/1933 DOA: 06/19/2023  PCP: Estevan Oaks, NP  Admit date: 06/19/2023 Discharge date: 07/01/2023 Admitted From: Home Disposition: SNF Recommendations for Outpatient Follow-up:  Outpatient follow-up with orthopedic surgery as below Check CMP and CBC at follow-up Please follow up on the following pending results: None  Discharge Condition: Stable CODE STATUS: Full code  Follow-up Information     Nadara Mustard, MD Follow up in 1 week(s).   Specialty: Orthopedic Surgery Contact information: [address] [place] [address] [phone]                 Hospital course 89 year old male with history of diabetes type 2, GERD, hypertension, hyperlipidemia, coronary disease status post CABG, peripheral artery disease, history of prostate cancer, CKD stage IIIb who presented to the emergency room with complaint of worsening ulcer on his left great toe. drainage of pus and blood when the dressing was being changed, had foul smell.  No history of fever or chills.  On presentation he was hypertensive, afebrile.  Lab work showed creatinine of 1.76, baseline creatinine of 1.4-1.5.  Left foot x-ray showed increased subcutaneous air within the distal aspect of the great toe suspicious for early osteomyelitis.  Vascular surgery, orthopedics consulted.  Underwent left lower extremity angiogram on 9/23 with finding of small vessel disease not amenable to intervention.  Left BKA recommended.  Patient underwent left BKA on 9/25 by Dr. Lajoyce Corners.  Therapy recommended CIR but CIR declined.  Discharged to SNF for ongoing therapy.  Outpatient follow-up with orthopedic surgery as above.  See individual problem list below for more.   Problems addressed during this hospitalization Principal Problem:   Gangrene of toe of left foot (HCC) Active Problems:   Claudication in peripheral vascular disease (HCC)   Normocytic anemia    CAD s/p CABG in 2009   CKD stage 3 due to type 2 diabetes mellitus (HCC)   Diabetes mellitus without complication (HCC)   Asymptomatic bilateral carotid artery stenosis   Primary hypertension   GERD (gastroesophageal reflux disease)   HLD (hyperlipidemia)   Osteomyelitis of great toe of left foot (HCC)   Gangrene of left lower extremity: Failed conservative management.  With osteomyelitis and small vessel disease patient underwent transtibial amputation on 9/25 with effective source control.   -Continue PT/OT at SNF -Pain control with Tylenol and oxycodone with bowel regimen -Outpatient follow-up with orthopedic surgery   Peripheral vascular disease: Follows with Dr Jacinto Halim, now with amputation. -Continue low-dose Xarelto and aspirin per Dr. Jacinto Halim   Normocytic anemia: Likely from chronic medical conditions.    Iron of 26.  Given IV iron.    No evidence of acute blood loss except for surgery.  Transfused with  1 unit of PRBC on 9/26.  Hgb improved to 8.7. -Repeat CBC in 1 to 2 weeks   Coronary artery disease: Status post CABG. no anginal Symptoms.  On Lipitor, aspirin, Zetia, Zocor at home   CKD stage IIIb/NAGMA: Baseline creatinine 1.4-1.6.  Follows with nephrology as an outpatient.  Acidosis resolved.   Diabetes type 2: A1c of 8.8%. -Increased home Lantus from 10 to 12 units -Continue carb modified diet   Bilateral carotid artery stenosis:  -Statin, Xarelto and aspirin as above. -Patient follow-up with cardiology   Hypertension: BP slightly elevated this morning  -Continue home meds -Added low-dose hydralazine. -Reassess and adjust as appropriate   Hyperlipidemia: Continue zokor, Zetia   GERD: Continue PPI   Peripheral

## 2023-07-02 DIAGNOSIS — I739 Peripheral vascular disease, unspecified: Secondary | ICD-10-CM | POA: Diagnosis not present

## 2023-07-02 DIAGNOSIS — Z89512 Acquired absence of left leg below knee: Secondary | ICD-10-CM | POA: Diagnosis not present

## 2023-07-02 DIAGNOSIS — I10 Essential (primary) hypertension: Secondary | ICD-10-CM | POA: Diagnosis not present

## 2023-07-03 ENCOUNTER — Other Ambulatory Visit: Payer: Self-pay | Admitting: Cardiology

## 2023-07-03 DIAGNOSIS — L97521 Non-pressure chronic ulcer of other part of left foot limited to breakdown of skin: Secondary | ICD-10-CM

## 2023-07-13 ENCOUNTER — Other Ambulatory Visit: Payer: Self-pay | Admitting: Cardiology

## 2023-07-13 ENCOUNTER — Ambulatory Visit: Payer: Medicare Other | Admitting: Orthopedic Surgery

## 2023-07-13 ENCOUNTER — Encounter: Payer: Self-pay | Admitting: Orthopedic Surgery

## 2023-07-13 DIAGNOSIS — Z89512 Acquired absence of left leg below knee: Secondary | ICD-10-CM

## 2023-07-13 DIAGNOSIS — I10 Essential (primary) hypertension: Secondary | ICD-10-CM

## 2023-07-13 DIAGNOSIS — N401 Enlarged prostate with lower urinary tract symptoms: Secondary | ICD-10-CM

## 2023-07-13 NOTE — Progress Notes (Signed)
Office Visit Note   Patient: Justin [name]           Date of Birth: 19-Aug-1933           MRN: [id number] Visit Date: 07/13/2023              Requested by: Estevan Oaks, NP [address] [place],  [place] [address] PCP: Eloisa Northern, MD  Chief Complaint  Patient presents with   Left Leg - Routine Post Op    06/24/23 left BKA      HPI: Patient is an 89 year old gentleman who is 3 weeks status post left below-knee amputation.  Assessment & Plan: Visit Diagnoses:  1. Left below-knee amputee [facility])     Plan: Staples harvested today continue the stump shrinker and prescription provided for a smaller stump shrinker.  Follow-Up Instructions: Return in about 3 weeks (around 08/03/2023).   Ortho Exam  Patient is alert, oriented, no adenopathy, well-dressed, normal affect, normal respiratory effort. Examination the incision is well-healed there is no cellulitis odor or drainage.  Staples are harvested.  Prescription provided for [employer] for a new smaller shrinker.  Range of motion 0-1 20.  Imaging: No results found.   Labs: Lab Results  Component Value Date   HGBA1C 8.8 (H) 06/19/2023   HGBA1C 7.2 (H) 11/06/2020   HGBA1C 8.0 (H) 10/08/2017   ESRSEDRATE 106 (H) 06/19/2023   ESRSEDRATE 16 12/01/2017   CRP 4.5 (H) 06/19/2023   REPTSTATUS 06/25/2023 FINAL 06/20/2023   CULT  06/20/2023    NO GROWTH 5 DAYS Performed at [facility], [address], [place], [place] [address]    LABORGA KLEBSIELLA OXYTOCA (A) 03/08/2021     Lab Results  Component Value Date   ALBUMIN 2.9 (L) 06/16/2021   ALBUMIN 3.4 (L) 06/14/2021   ALBUMIN 4.1 03/21/2019    Lab Results  Component Value Date   MG 1.8 06/16/2021   MG 1.6 (L) 12/23/2018   MG 1.7 12/22/2018   No results found for: "VD25OH"  No results found for: "PREALBUMIN"    Latest Ref Rng & Units 06/27/2023    3:10 AM 06/26/2023    6:45 PM 06/25/2023    3:47 AM  CBC EXTENDED  WBC 4.0 - 10.5 K/uL 11.7  12.9   16.5   RBC 4.22 - 5.81 MIL/uL 3.30  3.18  2.65   Hemoglobin 13.0 - 17.0 g/dL 8.7  8.4  7.2   HCT 82.9 - 52.0 % 27.1  26.3  22.3   Platelets 150 - 400 K/uL 245  229  219      There is no height or weight on file to calculate BMI.  Orders:  No orders of the defined types were placed in this encounter.  No orders of the defined types were placed in this encounter.    Procedures: No procedures performed  Clinical Data: No additional findings.  ROS:  All other systems negative, except as noted in the HPI. Review of Systems  Objective: Vital Signs: There were no vitals taken for this visit.  Specialty Comments:  No specialty comments available.  PMFS History: Patient Active Problem List   Diagnosis Date Noted   Osteomyelitis of great toe of left foot (HCC) 06/20/2023   Gangrene of toe of left foot (HCC) 06/19/2023   Normocytic anemia 06/19/2023   Eschar of left great toe 05/22/2023   Near syncope 06/15/2021   Dizziness 06/15/2021   Lumbar stenosis with neurogenic claudication 11/09/2020  CKD stage 3 due to type 2 diabetes mellitus (HCC) 12/14/2019   Asymptomatic bilateral carotid artery stenosis 12/14/2019   Abdominal distention    CAD s/p CABG in 2009 12/14/2018   Acute renal failure superimposed on stage 3 chronic kidney disease (HCC) 12/14/2018   UTI (urinary tract infection) 12/14/2018   HLD (hyperlipidemia) 12/14/2018   Sepsis (HCC) 12/14/2018   Abnormal LFTs    S/P CABG (coronary artery bypass graft) 12/06/2018   Asymptomatic stenosis of right carotid artery 12/06/2018   Claudication in peripheral vascular disease (HCC) 12/06/2018   Orthostatic hypotension due to diabetic dysautonomia 12/06/2018   Elevated liver enzymes 12/06/2018   Abdominal pain 10/12/2018   Abnormal liver function tests 10/12/2018   Foreign body in stomach 10/12/2018   Primary hypertension    GERD (gastroesophageal reflux disease)    Diabetes mellitus without complication (HCC)     Atherosclerosis of native coronary artery of native heart without angina pectoris    Past Medical History:  Diagnosis Date   Anxiety    Arthritis    Carotid arterial disease (HCC)    CKD (chronic kidney disease)    Coronary artery disease    Diabetes mellitus without complication (HCC)    Diabetic retinopathy (HCC)    NPDR OU   Diverticulitis    Dyspnea    GERD (gastroesophageal reflux disease)    History of kidney stones 2021   Hypercholesteremia    Hypertension    Hypertensive retinopathy    OU   Myocardial infarction [facility]) 2009   Pneumonia    as a child   Prostate cancer (HCC)    UTI (lower urinary tract infection)     Family History  Problem Relation Age of Onset   Diabetes Father    Diabetes Maternal Aunt    Diabetes Maternal Uncle    Diabetes Maternal Grandmother    Heart disease Sister    Diabetes Brother    Heart disease Sister     Past Surgical History:  Procedure Laterality Date   ABDOMINAL AORTOGRAM W/LOWER EXTREMITY N/A 06/22/2023   Procedure: ABDOMINAL AORTOGRAM W/LOWER EXTREMITY;  Surgeon: Maeola Harman, MD;  Location: [facility] INVASIVE CV LAB;  Service: Cardiovascular;  Laterality: N/A;   ABDOMINAL SURGERY     for diverticulitis; also removed appendix   AMPUTATION Left 06/24/2023   Procedure: LEFT BELOW THE KNEE AMPUTATION;  Surgeon: Nadara Mustard, MD;  Location: [facility] OR;  Service: Orthopedics;  Laterality: Left;   APPENDECTOMY     CARDIAC CATHETERIZATION  2018   CATARACT EXTRACTION     CATARACT EXTRACTION, BILATERAL     CHOLECYSTECTOMY N/A 10/12/2017   Procedure: LAPAROSCOPIC CHOLECYSTECTOMY;  Surgeon: Abigail Miyamoto, MD;  Location: [facility] OR;  Service: General;  Laterality: N/A;   CORONARY ARTERY BYPASS GRAFT  2009   ERCP N/A 12/21/2018   Procedure: ENDOSCOPIC RETROGRADE CHOLANGIOPANCREATOGRAPHY (ERCP);  Surgeon: Jeani Hawking, MD;  Location: [facility] ENDOSCOPY;  Service: Endoscopy;  Laterality: N/A;   ESOPHAGOGASTRODUODENOSCOPY (EGD) WITH PROPOFOL N/A  12/17/2018   Procedure: ESOPHAGOGASTRODUODENOSCOPY (EGD) WITH PROPOFOL;  Surgeon: Jeani Hawking, MD;  Location: [facility] ENDOSCOPY;  Service: Endoscopy;  Laterality: N/A;   EUS Left 12/17/2018   Procedure: UPPER ENDOSCOPIC ULTRASOUND (EUS) LINEAR;  Surgeon: Jeani Hawking, MD;  Location: [facility] ENDOSCOPY;  Service: Endoscopy;  Laterality: Left;   EYE SURGERY     "for bleeding in eye"   HERNIA REPAIR     LEFT HEART CATH AND CORONARY ANGIOGRAPHY N/A 11/04/2016   Procedure: Left Heart Cath and Coronary  Angiography;  Surgeon: Yates Decamp, MD;  Location: [facility] INVASIVE CV LAB;  Service: Cardiovascular;  Laterality: N/A;   LOWER EXTREMITY ANGIOGRAPHY N/A 11/04/2016   Procedure: Lower Extremity Angiography;  Surgeon: Yates Decamp, MD;  Location: [facility] INVASIVE CV LAB;  Service: Cardiovascular;  Laterality: N/A;   LOWER EXTREMITY ANGIOGRAPHY N/A 01/03/2020   Procedure: LOWER EXTREMITY ANGIOGRAPHY;  Surgeon: Yates Decamp, MD;  Location: [facility] INVASIVE CV LAB;  Service: Cardiovascular;  Laterality: N/A;   LOWER EXTREMITY ANGIOGRAPHY Bilateral 01/14/2022   Procedure: Lower Extremity Angiography;  Surgeon: Yates Decamp, MD;  Location: [facility] INVASIVE CV LAB;  Service: Cardiovascular;  Laterality: Bilateral;   LUMBAR LAMINECTOMY/DECOMPRESSION MICRODISCECTOMY Bilateral 11/09/2020   Procedure: Laminectomy and Foraminotomy - bilateral - Lumbar Four-Five.;  Surgeon: Julio Sicks, MD;  Location: [facility] OR;  Service: Neurosurgery;  Laterality: Bilateral;  posterior   PROSTATE SURGERY     REMOVAL OF STONES  12/21/2018   Procedure: REMOVAL OF STONES;  Surgeon: Jeani Hawking, MD;  Location: [facility] ENDOSCOPY;  Service: Endoscopy;;   SPHINCTEROTOMY  12/21/2018   Procedure: Dennison Mascot;  Surgeon: Jeani Hawking, MD;  Location: [facility] ENDOSCOPY;  Service: Endoscopy;;   Social History   Occupational History   Not on file  Tobacco Use   Smoking status: Former    Current packs/day: 0.25    Average packs/day: 0.3 packs/day for 2.0 years (0.5 ttl pk-yrs)    Types:  Cigarettes   Smokeless tobacco: Never   Tobacco comments:    when he was a teenage age [age]  Vaping Use   Vaping status: Never Used  Substance and Sexual Activity   Alcohol use: No   Drug use: No   Sexual activity: Not Currently

## 2023-07-13 NOTE — Telephone Encounter (Signed)
Refill request

## 2023-07-15 ENCOUNTER — Other Ambulatory Visit (HOSPITAL_COMMUNITY): Payer: Self-pay

## 2023-07-22 ENCOUNTER — Other Ambulatory Visit: Payer: Self-pay | Admitting: Cardiology

## 2023-07-22 DIAGNOSIS — I10 Essential (primary) hypertension: Secondary | ICD-10-CM

## 2023-08-03 ENCOUNTER — Ambulatory Visit (INDEPENDENT_AMBULATORY_CARE_PROVIDER_SITE_OTHER): Payer: Medicare Other | Admitting: Orthopedic Surgery

## 2023-08-03 DIAGNOSIS — Z89512 Acquired absence of left leg below knee: Secondary | ICD-10-CM

## 2023-08-04 ENCOUNTER — Encounter: Payer: Self-pay | Admitting: Orthopedic Surgery

## 2023-08-04 NOTE — Progress Notes (Signed)
Office Visit Note   Patient: Justin [name]           Date of Birth: August 08, 1933           MRN: [id number] Visit Date: 08/03/2023              Requested by: Eloisa Northern, MD [address] [place],  [place] [address] PCP: Eloisa Northern, MD  Chief Complaint  Patient presents with   Left Leg - Routine Post Op    06/24/2023 left BKA       HPI: Patient is a 89 year old gentleman who is status post left BKA September 25.  Patient states he has developed an abrasion over the tibial crest.  Assessment & Plan: Visit Diagnoses:  1. Left below-knee amputee [facility])     Plan: Continue compression with the stump shrinker.  A prescription was provided for [employer] for prosthetic fitting.  Follow-Up Instructions: Return in about 4 weeks (around 08/31/2023).   Ortho Exam  Patient is alert, oriented, no adenopathy, well-dressed, normal affect, normal respiratory effort. Examination there is a superficial abrasion over the tibial crest with good healing.  The residual limb is well consolidated.  Patient is a new left transtibial  amputee.  Patient's current comorbidities are not expected to impact the ability to function with the prescribed prosthesis. Patient verbally communicates a strong desire to use a prosthesis. Patient currently requires mobility aids to ambulate without a prosthesis.  Expects not to use mobility aids with a new prosthesis.  Patient is a K2 level ambulator that will use a prosthesis to walk around their home and the community over low level environmental barriers.      Imaging: No results found.   Labs: Lab Results  Component Value Date   HGBA1C 8.8 (H) 06/19/2023   HGBA1C 7.2 (H) 11/06/2020   HGBA1C 8.0 (H) 10/08/2017   ESRSEDRATE 106 (H) 06/19/2023   ESRSEDRATE 16 12/01/2017   CRP 4.5 (H) 06/19/2023   REPTSTATUS 06/25/2023 FINAL 06/20/2023   CULT  06/20/2023    NO GROWTH 5 DAYS Performed at [facility], [address] [place], [place] [address]     LABORGA KLEBSIELLA OXYTOCA (A) 03/08/2021     Lab Results  Component Value Date   ALBUMIN 2.9 (L) 06/16/2021   ALBUMIN 3.4 (L) 06/14/2021   ALBUMIN 4.1 03/21/2019    Lab Results  Component Value Date   MG 1.8 06/16/2021   MG 1.6 (L) 12/23/2018   MG 1.7 12/22/2018   No results found for: "VD25OH"  No results found for: "PREALBUMIN"    Latest Ref Rng & Units 06/27/2023    3:10 AM 06/26/2023    6:45 PM 06/25/2023    3:47 AM  CBC EXTENDED  WBC 4.0 - 10.5 K/uL 11.7  12.9  16.5   RBC 4.22 - 5.81 MIL/uL 3.30  3.18  2.65   Hemoglobin 13.0 - 17.0 g/dL 8.7  8.4  7.2   HCT 95.2 - 52.0 % 27.1  26.3  22.3   Platelets 150 - 400 K/uL 245  229  219      There is no height or weight on file to calculate BMI.  Orders:  No orders of the defined types were placed in this encounter.  No orders of the defined types were placed in this encounter.    Procedures: No procedures performed  Clinical Data: No additional findings.  ROS:  All other systems negative, except as noted in the HPI.  Review of Systems  Objective: Vital Signs: There were no vitals taken for this visit.  Specialty Comments:  No specialty comments available.  PMFS History: Patient Active Problem List   Diagnosis Date Noted   Osteomyelitis of great toe of left foot (HCC) 06/20/2023   Gangrene of toe of left foot (HCC) 06/19/2023   Normocytic anemia 06/19/2023   Eschar of left great toe 05/22/2023   Near syncope 06/15/2021   Dizziness 06/15/2021   Lumbar stenosis with neurogenic claudication 11/09/2020   CKD stage 3 due to type 2 diabetes mellitus (HCC) 12/14/2019   Asymptomatic bilateral carotid artery stenosis 12/14/2019   Abdominal distention    CAD s/p CABG in 2009 12/14/2018   Acute renal failure superimposed on stage 3 chronic kidney disease (HCC) 12/14/2018   UTI (urinary tract infection) 12/14/2018   HLD (hyperlipidemia) 12/14/2018   Sepsis (HCC) 12/14/2018   Abnormal LFTs    S/P CABG  (coronary artery bypass graft) 12/06/2018   Asymptomatic stenosis of right carotid artery 12/06/2018   Claudication in peripheral vascular disease (HCC) 12/06/2018   Orthostatic hypotension due to diabetic dysautonomia 12/06/2018   Elevated liver enzymes 12/06/2018   Abdominal pain 10/12/2018   Abnormal liver function tests 10/12/2018   Foreign body in stomach 10/12/2018   Primary hypertension    GERD (gastroesophageal reflux disease)    Diabetes mellitus without complication (HCC)    Atherosclerosis of native coronary artery of native heart without angina pectoris    Past Medical History:  Diagnosis Date   Anxiety    Arthritis    Carotid arterial disease (HCC)    CKD (chronic kidney disease)    Coronary artery disease    Diabetes mellitus without complication (HCC)    Diabetic retinopathy (HCC)    NPDR OU   Diverticulitis    Dyspnea    GERD (gastroesophageal reflux disease)    History of kidney stones 2021   Hypercholesteremia    Hypertension    Hypertensive retinopathy    OU   Myocardial infarction [facility]) 2009   Pneumonia    as a child   Prostate cancer (HCC)    UTI (lower urinary tract infection)     Family History  Problem Relation Age of Onset   Diabetes Father    Diabetes Maternal Aunt    Diabetes Maternal Uncle    Diabetes Maternal Grandmother    Heart disease Sister    Diabetes Brother    Heart disease Sister     Past Surgical History:  Procedure Laterality Date   ABDOMINAL AORTOGRAM W/LOWER EXTREMITY N/A 06/22/2023   Procedure: ABDOMINAL AORTOGRAM W/LOWER EXTREMITY;  Surgeon: Maeola Harman, MD;  Location: [facility] INVASIVE CV LAB;  Service: Cardiovascular;  Laterality: N/A;   ABDOMINAL SURGERY     for diverticulitis; also removed appendix   AMPUTATION Left 06/24/2023   Procedure: LEFT BELOW THE KNEE AMPUTATION;  Surgeon: Nadara Mustard, MD;  Location: [facility] OR;  Service: Orthopedics;  Laterality: Left;   APPENDECTOMY     CARDIAC CATHETERIZATION  2018    CATARACT EXTRACTION     CATARACT EXTRACTION, BILATERAL     CHOLECYSTECTOMY N/A 10/12/2017   Procedure: LAPAROSCOPIC CHOLECYSTECTOMY;  Surgeon: Abigail Miyamoto, MD;  Location: [facility] OR;  Service: General;  Laterality: N/A;   CORONARY ARTERY BYPASS GRAFT  2009   ERCP N/A 12/21/2018   Procedure: ENDOSCOPIC RETROGRADE CHOLANGIOPANCREATOGRAPHY (ERCP);  Surgeon: Jeani Hawking, MD;  Location: [facility] ENDOSCOPY;  Service: Endoscopy;  Laterality: N/A;   ESOPHAGOGASTRODUODENOSCOPY (EGD) WITH PROPOFOL  N/A 12/17/2018   Procedure: ESOPHAGOGASTRODUODENOSCOPY (EGD) WITH PROPOFOL;  Surgeon: Jeani Hawking, MD;  Location: [facility] ENDOSCOPY;  Service: Endoscopy;  Laterality: N/A;   EUS Left 12/17/2018   Procedure: UPPER ENDOSCOPIC ULTRASOUND (EUS) LINEAR;  Surgeon: Jeani Hawking, MD;  Location: [facility] ENDOSCOPY;  Service: Endoscopy;  Laterality: Left;   EYE SURGERY     "for bleeding in eye"   HERNIA REPAIR     LEFT HEART CATH AND CORONARY ANGIOGRAPHY N/A 11/04/2016   Procedure: Left Heart Cath and Coronary Angiography;  Surgeon: Yates Decamp, MD;  Location: [facility] INVASIVE CV LAB;  Service: Cardiovascular;  Laterality: N/A;   LOWER EXTREMITY ANGIOGRAPHY N/A 11/04/2016   Procedure: Lower Extremity Angiography;  Surgeon: Yates Decamp, MD;  Location: [facility] INVASIVE CV LAB;  Service: Cardiovascular;  Laterality: N/A;   LOWER EXTREMITY ANGIOGRAPHY N/A 01/03/2020   Procedure: LOWER EXTREMITY ANGIOGRAPHY;  Surgeon: Yates Decamp, MD;  Location: [facility] INVASIVE CV LAB;  Service: Cardiovascular;  Laterality: N/A;   LOWER EXTREMITY ANGIOGRAPHY Bilateral 01/14/2022   Procedure: Lower Extremity Angiography;  Surgeon: Yates Decamp, MD;  Location: [facility] INVASIVE CV LAB;  Service: Cardiovascular;  Laterality: Bilateral;   LUMBAR LAMINECTOMY/DECOMPRESSION MICRODISCECTOMY Bilateral 11/09/2020   Procedure: Laminectomy and Foraminotomy - bilateral - Lumbar Four-Five.;  Surgeon: Julio Sicks, MD;  Location: [facility] OR;  Service: Neurosurgery;  Laterality: Bilateral;  posterior    PROSTATE SURGERY     REMOVAL OF STONES  12/21/2018   Procedure: REMOVAL OF STONES;  Surgeon: Jeani Hawking, MD;  Location: [facility] ENDOSCOPY;  Service: Endoscopy;;   SPHINCTEROTOMY  12/21/2018   Procedure: Dennison Mascot;  Surgeon: Jeani Hawking, MD;  Location: [facility] ENDOSCOPY;  Service: Endoscopy;;   Social History   Occupational History   Not on file  Tobacco Use   Smoking status: Former    Current packs/day: 0.25    Average packs/day: 0.3 packs/day for 2.0 years (0.5 ttl pk-yrs)    Types: Cigarettes   Smokeless tobacco: Never   Tobacco comments:    when he was a teenage age [age]  Vaping Use   Vaping status: Never Used  Substance and Sexual Activity   Alcohol use: No   Drug use: No   Sexual activity: Not Currently

## 2023-08-31 ENCOUNTER — Ambulatory Visit: Payer: Medicare Other | Admitting: Orthopedic Surgery

## 2023-08-31 ENCOUNTER — Encounter: Payer: Self-pay | Admitting: Orthopedic Surgery

## 2023-08-31 DIAGNOSIS — Z89512 Acquired absence of left leg below knee: Secondary | ICD-10-CM

## 2023-08-31 NOTE — Progress Notes (Signed)
Office Visit Note   Patient: Justin [name]           Date of Birth: 23-Sep-1933           MRN: [id number] Visit Date: 08/31/2023              Requested by: Eloisa Northern, MD [address] [place],  [place] [address] PCP: Eloisa Northern, MD  Chief Complaint  Patient presents with   Left Leg - Routine Post Op    06/24/2023 left BKA       HPI: Patient is an 89 year old gentleman who is 2 and half months status post left below-knee amputation.  Patient did have a pressure injury along the tibial crest from his compression wraps.  This has resolved.  Assessment & Plan: Visit Diagnoses:  1. Left below-knee amputee [facility])     Plan: Patient will make a follow-up appointment with [facility].  Patient will follow-up with physical therapy upstairs once he has his prosthesis.  Follow-Up Instructions: Return in about 2 months (around 11/01/2023).   Ortho Exam  Patient is alert, oriented, no adenopathy, well-dressed, normal affect, normal respiratory effort. Examination the tibial crest wound is well-healed.  His residual limb is well consolidated.  Imaging: No results found. No images are attached to the encounter.  Labs: Lab Results  Component Value Date   HGBA1C 8.8 (H) 06/19/2023   HGBA1C 7.2 (H) 11/06/2020   HGBA1C 8.0 (H) 10/08/2017   ESRSEDRATE 106 (H) 06/19/2023   ESRSEDRATE 16 12/01/2017   CRP 4.5 (H) 06/19/2023   REPTSTATUS 06/25/2023 FINAL 06/20/2023   CULT  06/20/2023    NO GROWTH 5 DAYS Performed at [facility], [address], [place], [place] [address]    LABORGA KLEBSIELLA OXYTOCA (A) 03/08/2021     Lab Results  Component Value Date   ALBUMIN 2.9 (L) 06/16/2021   ALBUMIN 3.4 (L) 06/14/2021   ALBUMIN 4.1 03/21/2019    Lab Results  Component Value Date   MG 1.8 06/16/2021   MG 1.6 (L) 12/23/2018   MG 1.7 12/22/2018   No results found for: "VD25OH"  No results found for: "PREALBUMIN"    Latest Ref Rng & Units 06/27/2023    3:10 AM 06/26/2023    6:45  PM 06/25/2023    3:47 AM  CBC EXTENDED  WBC 4.0 - 10.5 K/uL 11.7  12.9  16.5   RBC 4.22 - 5.81 MIL/uL 3.30  3.18  2.65   Hemoglobin 13.0 - 17.0 g/dL 8.7  8.4  7.2   HCT 82.9 - 52.0 % 27.1  26.3  22.3   Platelets 150 - 400 K/uL 245  229  219      There is no height or weight on file to calculate BMI.  Orders:  No orders of the defined types were placed in this encounter.  No orders of the defined types were placed in this encounter.    Procedures: No procedures performed  Clinical Data: No additional findings.  ROS:  All other systems negative, except as noted in the HPI. Review of Systems  Objective: Vital Signs: There were no vitals taken for this visit.  Specialty Comments:  No specialty comments available.  PMFS History: Patient Active Problem List   Diagnosis Date Noted   Osteomyelitis of great toe of left foot (HCC) 06/20/2023   Gangrene of toe of left foot (HCC) 06/19/2023   Normocytic anemia 06/19/2023   Eschar of left great toe 05/22/2023  Near syncope 06/15/2021   Dizziness 06/15/2021   Lumbar stenosis with neurogenic claudication 11/09/2020   CKD stage 3 due to type 2 diabetes mellitus (HCC) 12/14/2019   Asymptomatic bilateral carotid artery stenosis 12/14/2019   Abdominal distention    CAD s/p CABG in 2009 12/14/2018   Acute renal failure superimposed on stage 3 chronic kidney disease (HCC) 12/14/2018   UTI (urinary tract infection) 12/14/2018   HLD (hyperlipidemia) 12/14/2018   Sepsis (HCC) 12/14/2018   Abnormal LFTs    S/P CABG (coronary artery bypass graft) 12/06/2018   Asymptomatic stenosis of right carotid artery 12/06/2018   Claudication in peripheral vascular disease (HCC) 12/06/2018   Orthostatic hypotension due to diabetic dysautonomia 12/06/2018   Elevated liver enzymes 12/06/2018   Abdominal pain 10/12/2018   Abnormal liver function tests 10/12/2018   Foreign body in stomach 10/12/2018   Primary hypertension    GERD  (gastroesophageal reflux disease)    Diabetes mellitus without complication (HCC)    Atherosclerosis of native coronary artery of native heart without angina pectoris    Past Medical History:  Diagnosis Date   Anxiety    Arthritis    Carotid arterial disease (HCC)    CKD (chronic kidney disease)    Coronary artery disease    Diabetes mellitus without complication (HCC)    Diabetic retinopathy (HCC)    NPDR OU   Diverticulitis    Dyspnea    GERD (gastroesophageal reflux disease)    History of kidney stones 2021   Hypercholesteremia    Hypertension    Hypertensive retinopathy    OU   Myocardial infarction [facility]) 2009   Pneumonia    as a child   Prostate cancer (HCC)    UTI (lower urinary tract infection)     Family History  Problem Relation Age of Onset   Diabetes Father    Diabetes Maternal Aunt    Diabetes Maternal Uncle    Diabetes Maternal Grandmother    Heart disease Sister    Diabetes Brother    Heart disease Sister     Past Surgical History:  Procedure Laterality Date   ABDOMINAL AORTOGRAM W/LOWER EXTREMITY N/A 06/22/2023   Procedure: ABDOMINAL AORTOGRAM W/LOWER EXTREMITY;  Surgeon: Maeola Harman, MD;  Location: [facility] INVASIVE CV LAB;  Service: Cardiovascular;  Laterality: N/A;   ABDOMINAL SURGERY     for diverticulitis; also removed appendix   AMPUTATION Left 06/24/2023   Procedure: LEFT BELOW THE KNEE AMPUTATION;  Surgeon: Nadara Mustard, MD;  Location: [facility] OR;  Service: Orthopedics;  Laterality: Left;   APPENDECTOMY     CARDIAC CATHETERIZATION  2018   CATARACT EXTRACTION     CATARACT EXTRACTION, BILATERAL     CHOLECYSTECTOMY N/A 10/12/2017   Procedure: LAPAROSCOPIC CHOLECYSTECTOMY;  Surgeon: Abigail Miyamoto, MD;  Location: [facility] OR;  Service: General;  Laterality: N/A;   CORONARY ARTERY BYPASS GRAFT  2009   ERCP N/A 12/21/2018   Procedure: ENDOSCOPIC RETROGRADE CHOLANGIOPANCREATOGRAPHY (ERCP);  Surgeon: Jeani Hawking, MD;  Location: [facility] ENDOSCOPY;   Service: Endoscopy;  Laterality: N/A;   ESOPHAGOGASTRODUODENOSCOPY (EGD) WITH PROPOFOL N/A 12/17/2018   Procedure: ESOPHAGOGASTRODUODENOSCOPY (EGD) WITH PROPOFOL;  Surgeon: Jeani Hawking, MD;  Location: [facility] ENDOSCOPY;  Service: Endoscopy;  Laterality: N/A;   EUS Left 12/17/2018   Procedure: UPPER ENDOSCOPIC ULTRASOUND (EUS) LINEAR;  Surgeon: Jeani Hawking, MD;  Location: [facility] ENDOSCOPY;  Service: Endoscopy;  Laterality: Left;   EYE SURGERY     "for bleeding in eye"   HERNIA REPAIR  LEFT HEART CATH AND CORONARY ANGIOGRAPHY N/A 11/04/2016   Procedure: Left Heart Cath and Coronary Angiography;  Surgeon: Yates Decamp, MD;  Location: [facility] INVASIVE CV LAB;  Service: Cardiovascular;  Laterality: N/A;   LOWER EXTREMITY ANGIOGRAPHY N/A 11/04/2016   Procedure: Lower Extremity Angiography;  Surgeon: Yates Decamp, MD;  Location: [facility] INVASIVE CV LAB;  Service: Cardiovascular;  Laterality: N/A;   LOWER EXTREMITY ANGIOGRAPHY N/A 01/03/2020   Procedure: LOWER EXTREMITY ANGIOGRAPHY;  Surgeon: Yates Decamp, MD;  Location: [facility] INVASIVE CV LAB;  Service: Cardiovascular;  Laterality: N/A;   LOWER EXTREMITY ANGIOGRAPHY Bilateral 01/14/2022   Procedure: Lower Extremity Angiography;  Surgeon: Yates Decamp, MD;  Location: [facility] INVASIVE CV LAB;  Service: Cardiovascular;  Laterality: Bilateral;   LUMBAR LAMINECTOMY/DECOMPRESSION MICRODISCECTOMY Bilateral 11/09/2020   Procedure: Laminectomy and Foraminotomy - bilateral - Lumbar Four-Five.;  Surgeon: Julio Sicks, MD;  Location: [facility] OR;  Service: Neurosurgery;  Laterality: Bilateral;  posterior   PROSTATE SURGERY     REMOVAL OF STONES  12/21/2018   Procedure: REMOVAL OF STONES;  Surgeon: Jeani Hawking, MD;  Location: [facility] ENDOSCOPY;  Service: Endoscopy;;   SPHINCTEROTOMY  12/21/2018   Procedure: Dennison Mascot;  Surgeon: Jeani Hawking, MD;  Location: [facility] ENDOSCOPY;  Service: Endoscopy;;   Social History   Occupational History   Not on file  Tobacco Use   Smoking status: Former    Current  packs/day: 0.25    Average packs/day: 0.3 packs/day for 2.0 years (0.5 ttl pk-yrs)    Types: Cigarettes   Smokeless tobacco: Never   Tobacco comments:    when he was a teenage age [age]  Vaping Use   Vaping status: Never Used  Substance and Sexual Activity   Alcohol use: No   Drug use: No   Sexual activity: Not Currently

## 2023-09-16 ENCOUNTER — Ambulatory Visit (INDEPENDENT_AMBULATORY_CARE_PROVIDER_SITE_OTHER): Payer: Medicare Other | Admitting: Physical Therapy

## 2023-09-16 ENCOUNTER — Other Ambulatory Visit: Payer: Self-pay

## 2023-09-16 ENCOUNTER — Encounter: Payer: Self-pay | Admitting: Physical Therapy

## 2023-09-16 DIAGNOSIS — R2681 Unsteadiness on feet: Secondary | ICD-10-CM | POA: Diagnosis not present

## 2023-09-16 DIAGNOSIS — Z7409 Other reduced mobility: Secondary | ICD-10-CM | POA: Diagnosis not present

## 2023-09-16 DIAGNOSIS — R2689 Other abnormalities of gait and mobility: Secondary | ICD-10-CM | POA: Diagnosis not present

## 2023-09-16 DIAGNOSIS — M6281 Muscle weakness (generalized): Secondary | ICD-10-CM | POA: Diagnosis not present

## 2023-09-16 NOTE — Therapy (Signed)
OUTPATIENT PHYSICAL THERAPY PROSTHETIC EVALUATION   Patient Name: Justin [name] MRN: [id number] DOB:October 31, 1932, 89 y.o., male Today's Date: 09/16/2023  END OF SESSION:  PT End of Session - 09/16/23 0838     Visit Number 1    Number of Visits 25    Date for PT Re-Evaluation 12/10/23    Authorization Type [employer] Medicare    Authorization Time Period $20 co-pay    Progress Note Due on Visit 10    PT Start Time 0800    PT Stop Time 0840    PT Time Calculation (min) 40 min    Equipment Utilized During Treatment Gait belt    Activity Tolerance Patient tolerated treatment well    Behavior During Therapy WFL for tasks assessed/performed             Past Medical History:  Diagnosis Date   Anxiety    Arthritis    Carotid arterial disease (HCC)    CKD (chronic kidney disease)    Coronary artery disease    Diabetes mellitus without complication (HCC)    Diabetic retinopathy (HCC)    NPDR OU   Diverticulitis    Dyspnea    GERD (gastroesophageal reflux disease)    History of kidney stones 2021   Hypercholesteremia    Hypertension    Hypertensive retinopathy    OU   Myocardial infarction [facility]) 2009   Pneumonia    as a child   Prostate cancer (HCC)    UTI (lower urinary tract infection)    Past Surgical History:  Procedure Laterality Date   ABDOMINAL AORTOGRAM W/LOWER EXTREMITY N/A 06/22/2023   Procedure: ABDOMINAL AORTOGRAM W/LOWER EXTREMITY;  Surgeon: Maeola Harman, MD;  Location: [facility] INVASIVE CV LAB;  Service: Cardiovascular;  Laterality: N/A;   ABDOMINAL SURGERY     for diverticulitis; also removed appendix   AMPUTATION Left 06/24/2023   Procedure: LEFT BELOW THE KNEE AMPUTATION;  Surgeon: Nadara Mustard, MD;  Location: [facility] OR;  Service: Orthopedics;  Laterality: Left;   APPENDECTOMY     CARDIAC CATHETERIZATION  2018   CATARACT EXTRACTION     CATARACT EXTRACTION, BILATERAL     CHOLECYSTECTOMY N/A 10/12/2017   Procedure: LAPAROSCOPIC CHOLECYSTECTOMY;   Surgeon: Abigail Miyamoto, MD;  Location: [facility] OR;  Service: General;  Laterality: N/A;   CORONARY ARTERY BYPASS GRAFT  2009   ERCP N/A 12/21/2018   Procedure: ENDOSCOPIC RETROGRADE CHOLANGIOPANCREATOGRAPHY (ERCP);  Surgeon: Jeani Hawking, MD;  Location: [facility] ENDOSCOPY;  Service: Endoscopy;  Laterality: N/A;   ESOPHAGOGASTRODUODENOSCOPY (EGD) WITH PROPOFOL N/A 12/17/2018   Procedure: ESOPHAGOGASTRODUODENOSCOPY (EGD) WITH PROPOFOL;  Surgeon: Jeani Hawking, MD;  Location: [facility] ENDOSCOPY;  Service: Endoscopy;  Laterality: N/A;   EUS Left 12/17/2018   Procedure: UPPER ENDOSCOPIC ULTRASOUND (EUS) LINEAR;  Surgeon: Jeani Hawking, MD;  Location: [facility] ENDOSCOPY;  Service: Endoscopy;  Laterality: Left;   EYE SURGERY     "for bleeding in eye"   HERNIA REPAIR     LEFT HEART CATH AND CORONARY ANGIOGRAPHY N/A 11/04/2016   Procedure: Left Heart Cath and Coronary Angiography;  Surgeon: Yates Decamp, MD;  Location: [facility] INVASIVE CV LAB;  Service: Cardiovascular;  Laterality: N/A;   LOWER EXTREMITY ANGIOGRAPHY N/A 11/04/2016   Procedure: Lower Extremity Angiography;  Surgeon: Yates Decamp, MD;  Location: [facility] INVASIVE CV LAB;  Service: Cardiovascular;  Laterality: N/A;   LOWER EXTREMITY ANGIOGRAPHY N/A 01/03/2020   Procedure: LOWER EXTREMITY ANGIOGRAPHY;  Surgeon: Yates Decamp, MD;  Location: [facility] INVASIVE CV LAB;  Service: Cardiovascular;  Laterality: N/A;   LOWER EXTREMITY ANGIOGRAPHY Bilateral 01/14/2022   Procedure: Lower Extremity Angiography;  Surgeon: Yates Decamp, MD;  Location: [facility] INVASIVE CV LAB;  Service: Cardiovascular;  Laterality: Bilateral;   LUMBAR LAMINECTOMY/DECOMPRESSION MICRODISCECTOMY Bilateral 11/09/2020   Procedure: Laminectomy and Foraminotomy - bilateral - Lumbar Four-Five.;  Surgeon: Julio Sicks, MD;  Location: [facility] OR;  Service: Neurosurgery;  Laterality: Bilateral;  posterior   PROSTATE SURGERY     REMOVAL OF STONES  12/21/2018   Procedure: REMOVAL OF STONES;  Surgeon: Jeani Hawking, MD;  Location: [facility] ENDOSCOPY;   Service: Endoscopy;;   SPHINCTEROTOMY  12/21/2018   Procedure: Dennison Mascot;  Surgeon: Jeani Hawking, MD;  Location: [facility] ENDOSCOPY;  Service: Endoscopy;;   Patient Active Problem List   Diagnosis Date Noted   Osteomyelitis of great toe of left foot (HCC) 06/20/2023   Gangrene of toe of left foot (HCC) 06/19/2023   Normocytic anemia 06/19/2023   Eschar of left great toe 05/22/2023   Near syncope 06/15/2021   Dizziness 06/15/2021   Lumbar stenosis with neurogenic claudication 11/09/2020   CKD stage 3 due to type 2 diabetes mellitus (HCC) 12/14/2019   Asymptomatic bilateral carotid artery stenosis 12/14/2019   Abdominal distention    CAD s/p CABG in 2009 12/14/2018   Acute renal failure superimposed on stage 3 chronic kidney disease (HCC) 12/14/2018   UTI (urinary tract infection) 12/14/2018   HLD (hyperlipidemia) 12/14/2018   Sepsis (HCC) 12/14/2018   Abnormal LFTs    S/P CABG (coronary artery bypass graft) 12/06/2018   Asymptomatic stenosis of right carotid artery 12/06/2018   Claudication in peripheral vascular disease (HCC) 12/06/2018   Orthostatic hypotension due to diabetic dysautonomia 12/06/2018   Elevated liver enzymes 12/06/2018   Abdominal pain 10/12/2018   Abnormal liver function tests 10/12/2018   Foreign body in stomach 10/12/2018   Primary hypertension    GERD (gastroesophageal reflux disease)    Diabetes mellitus without complication (HCC)    Atherosclerosis of native coronary artery of native heart without angina pectoris     PCP: Eloisa Northern, MD   REFERRING PROVIDER: Nadara Mustard, MD  ONSET DATE: 09/14/2023 Prosthesis delivery  REFERRING DIAG: W09.811 (ICD-10-CM) - Left below-knee amputee   THERAPY DIAG:  Other abnormalities of gait and mobility  Unsteadiness on feet  Muscle weakness (generalized)  Impaired functional mobility, balance, gait, and endurance  Rationale for Evaluation and Treatment: Rehabilitation  SUBJECTIVE:   SUBJECTIVE  STATEMENT: This 89yo male underwent a left Transtibial Amputation on 06/24/2023 due to an infected ulcer. He received his prosthesis 09/14/2023 and wore that day only for 30-45 minutes.  Pt accompanied by: family member  PERTINENT HISTORY: Left TTA 06/24/23, DM2, retinopathy, GERD, HLD, HTN, CAD, CABG 2009, PAD, prostate CA, CKD stage 3b, arthritis, Laminectomy & Foraminotomy bilateral L4-5,   PAIN:  Are you having pain? No  PRECAUTIONS: Fall  WEIGHT BEARING RESTRICTIONS: No  FALLS: Has patient fallen in last 6 months? Yes. Number of falls 1 no injuries  LIVING ENVIRONMENT: Lives with: lives with their family Lives in: House Home Access: Ramped entrance and single step Home layout: Two level, 1/2 bath on main level, and bedroom Stairs: Yes: Internal: 14 steps; on left going up and can reach both and External: 1+1 steps; none Has following equipment at home: Quad cane small base, Environmental consultant - 2 wheeled, Wheelchair (manual), shower chair, Shower bench, and bed side commode  OCCUPATION: retired from [employer]  PLOF: Independent used cane & walker   PATIENT GOALS: to  use prosthesis to walk in home & community, family wants to be safe,  go upstairs to shower  OBJECTIVE:   POSTURE: rounded shoulders, forward head, flexed trunk , and weight shift right  LOWER EXTREMITY ROM:  ROM P:passive  A:active Right eval Left eval  Hip flexion    Hip extension    Hip abduction    Hip adduction    Hip internal rotation    Hip external rotation    Knee flexion    Knee extension    Ankle dorsiflexion    Ankle plantarflexion    Ankle inversion    Ankle eversion     (Blank rows = not tested)  LOWER EXTREMITY MMT:  MMT Right eval Left eval  Hip flexion    Hip extension    Hip abduction    Hip adduction    Hip internal rotation    Hip external rotation    Knee flexion    Knee extension    Ankle dorsiflexion    Ankle plantarflexion    Ankle inversion    Ankle eversion    At  Evaluation all strength testing is grossly seated and functionally standing / gait. (Blank rows = not tested)  TRANSFERS: Sit to stand: SBA 20" w/c with armrest to RW limited use of LLE Stand to sit: SBA RW to 20" w/c with armrest limited use of LLE  FUNCTIONAL TESTs:  Deferred at evaluation on 09/16/2023 as limb too edematous to don prosthesis  GAIT: Deferred at evaluation on 09/16/2023 as limb too edematous to don prosthesis Gait pattern:  Distance walked:  Assistive device utilized:  Level of assistance:  Comments:   CURRENT PROSTHETIC WEAR ASSESSMENT: Evaluation on 09/16/2023:  Patient is dependent with: skin check, residual limb care, care of non-amputated limb, prosthetic cleaning, ply sock cleaning, correct ply sock adjustment, proper wear schedule/adjustment, and proper weight-bearing schedule/adjustment Donning prosthesis: Max A Doffing prosthesis: Min A Prosthetic wear tolerance: 30-45 min 2 days ago when prosthesis delivered.  Prosthetic weight bearing tolerance: 3 minutes partial weight bearing trying to engage pin suspension.  Pt reports distal lateral limb pain from wear 2 days ago.  Edema: pitting edema  Residual limb condition: scab dry over tibial crest 5cm wide X 15 cm long.  Dry skin, normal color & temperature. Cylindrical shape Prosthetic description: silicon liner with anterior portion 9mm thick, secondary pelite foam liner, total contact socket,     TODAY'S TREATMENT:                                                                                                                             DATE:  09/16/2023:  PATIENT EDUCATION: PATIENT EDUCATED ON FOLLOWING PROSTHETIC CARE: Education details:   Skin check, Residual limb care, Prosthetic cleaning, Correct ply sock adjustment, Propper donning, and Proper wear schedule/adjustment Prosthetic wear tolerance: liner only initially 2 hours 3x/day, 7 days/week Person educated: Patient and Child(ren) Education  method: Explanation, Demonstration, Tactile cues, and Verbal  cues Education comprehension: dtr & granddaughter verbalized understanding, verbal cues required, tactile cues required, and needs further education  HOME EXERCISE PROGRAM:  ASSESSMENT:  CLINICAL IMPRESSION: Patient is a 89 y.o. male who was seen today for physical therapy evaluation and treatment for prosthetic training with left Transtibial Amputation. Patient has not worn his shrinker so limb was too edematous.  Residual limb would not seat into socket. PT unable to complete balance & gait assessment.  Patient's family appear to understand initial prosthetic care instructions.   OBJECTIVE IMPAIRMENTS: Abnormal gait, decreased activity tolerance, decreased balance, decreased endurance, decreased knowledge of condition, decreased knowledge of use of DME, decreased mobility, difficulty walking, decreased ROM, decreased strength, increased edema, prosthetic dependency , and pain.   ACTIVITY LIMITATIONS: carrying, lifting, sitting, standing, stairs, transfers, and locomotion level  PARTICIPATION LIMITATIONS: standing ADLS, cleaning, community activity, and household activity  PERSONAL FACTORS: Age, Fitness, Past/current experiences, Time since onset of injury/illness/exacerbation, and 3+ comorbidities: see PMH  are also affecting patient's functional outcome.   REHAB POTENTIAL: Good  CLINICAL DECISION MAKING: Evolving/moderate complexity  EVALUATION COMPLEXITY: Moderate   GOALS: Goals reviewed with patient? Yes  SHORT TERM GOALS: Target date: 10/15/2023  Patient donnes prosthesis modified independent & family verbalizes proper cleaning. Baseline: SEE OBJECTIVE DATA Goal status: INITIAL 2.  Patient tolerates prosthesis >8 hrs total /day without skin issues increasing or limb pain >4/10 after standing. Baseline: SEE OBJECTIVE DATA Goal status: INITIAL  3.  Patient able to reach 7" and look over both shoulders with RW support  with supervision. Baseline: SEE OBJECTIVE DATA Goal status: INITIAL  4. Patient ambulates 4' with RW & prosthesis with supervision. Baseline: SEE OBJECTIVE DATA Goal status: INITIAL  5. Patient negotiates ramps & curbs with RW & prosthesis with minA. Baseline: SEE OBJECTIVE DATA Goal status: INITIAL  LONG TERM GOALS: Target date: 12/10/2023  Patient & family demonstrate & verbalize understanding of prosthetic care to enable safe utilization of prosthesis. Baseline: SEE OBJECTIVE DATA Goal status: INITIAL  Patient tolerates prosthesis wear >90% of awake hours without skin or limb pain issues. Baseline: SEE OBJECTIVE DATA Goal status: INITIAL  Task of Berg Balance with RW support >/= 30/56 to indicate lower fall risk with standing ADLs. Baseline: SEE OBJECTIVE DATA Goal status: INITIAL  Patient ambulates 54' modified independent &  >200' family supervision with prosthesis & RW. Baseline: SEE OBJECTIVE DATA Goal status: INITIAL  Patient negotiates ramps, curbs & stairs with single rail with prosthesis & RW with family assistance safely.  Baseline: SEE OBJECTIVE DATA Goal status: INITIAL   PLAN:  PT FREQUENCY: 2x/week  PT DURATION: 12 weeks  PLANNED INTERVENTIONS: 97164- PT Re-evaluation, 97110-Therapeutic exercises, 97530- Therapeutic activity, [id number]- Neuromuscular re-education, [phone]- Self Care, [id number]- Gait training, [phone]- Prosthetic training, Patient/Family education, Balance training, Stair training, DME instructions, and physical performance testing  PLAN FOR NEXT SESSION: review donning & prosthetic care, include prosthesis in wear, assess Berg with RW support and gait RW.     Vladimir Faster, PT, DPT 09/16/2023, 12:00 PM  Date of referral: 08/31/2023 Referring provider: Aldean Baker, MD Referring diagnosis? B14.782 (ICD-10-CM) - Left below-knee amputee  Treatment diagnosis? (if different than referring diagnosis)  Other abnormalities of gait and mobility   ICD-10-CM: R26.89   Unsteadiness on feet  ICD-10-CM: R26.81   Muscle weakness (generalized)  ICD-10-CM: M62.81   Impaired functional mobility, balance, gait, and endurance  ICD-10-CM: Z74.09   What was this (referring dx) caused by? Surgery (Type: left BKA)  Nature of Condition: Initial  Onset (within last 3 months) prosthesis delivery    Laterality: Lt  Current Functional Measure Score: Other standing balance supervision static stance 1 min.   Objective measurements identify impairments when they are compared to normal values, the uninvolved extremity, and prior level of function.  [x]  Yes  []  No  Objective assessment of functional ability: Moderate functional limitations   Briefly describe symptoms: patient is dependent in prosthetic care & use.  He has impaired mobility. Patient is deconditioned from prolonged limited activity with wound, then amputation.   How did symptoms start: infected wound led to amputation  Average pain intensity:  Last 24 hours: 0  Past week: 0  How often does the pt experience symptoms? Constantly  How much have the symptoms interfered with usual daily activities? Extremely  How has condition changed since care began at this facility? NA - initial visit  In general, how is the patients overall health? Good   BACK PAIN (STarT Back Screening Tool) No

## 2023-09-24 ENCOUNTER — Ambulatory Visit: Payer: Medicare Other | Admitting: Physical Therapy

## 2023-09-24 ENCOUNTER — Encounter: Payer: Self-pay | Admitting: Physical Therapy

## 2023-09-24 DIAGNOSIS — Z7409 Other reduced mobility: Secondary | ICD-10-CM

## 2023-09-24 DIAGNOSIS — R2681 Unsteadiness on feet: Secondary | ICD-10-CM

## 2023-09-24 DIAGNOSIS — M6281 Muscle weakness (generalized): Secondary | ICD-10-CM

## 2023-09-24 DIAGNOSIS — R2689 Other abnormalities of gait and mobility: Secondary | ICD-10-CM | POA: Diagnosis not present

## 2023-09-24 DIAGNOSIS — R42 Dizziness and giddiness: Secondary | ICD-10-CM

## 2023-09-24 NOTE — Therapy (Signed)
OUTPATIENT PHYSICAL THERAPY PROSTHETIC TREATMENT   Patient Name: Justin [name] MRN: [id number] DOB:1932/10/14, 89 y.o., male Today's Date: 09/24/2023  END OF SESSION:  PT End of Session - 09/24/23 1518     Visit Number 2    Number of Visits 25    Date for PT Re-Evaluation 12/10/23    Authorization Type [employer] Medicare    Authorization Time Period $20 co-pay    Progress Note Due on Visit 10    PT Start Time 1519    PT Stop Time 1610    PT Time Calculation (min) 51 min    Equipment Utilized During Treatment Gait belt    Activity Tolerance Patient tolerated treatment well    Behavior During Therapy WFL for tasks assessed/performed              Past Medical History:  Diagnosis Date   Anxiety    Arthritis    Carotid arterial disease (HCC)    CKD (chronic kidney disease)    Coronary artery disease    Diabetes mellitus without complication (HCC)    Diabetic retinopathy (HCC)    NPDR OU   Diverticulitis    Dyspnea    GERD (gastroesophageal reflux disease)    History of kidney stones 2021   Hypercholesteremia    Hypertension    Hypertensive retinopathy    OU   Myocardial infarction [facility]) 2009   Pneumonia    as a child   Prostate cancer (HCC)    UTI (lower urinary tract infection)    Past Surgical History:  Procedure Laterality Date   ABDOMINAL AORTOGRAM W/LOWER EXTREMITY N/A 06/22/2023   Procedure: ABDOMINAL AORTOGRAM W/LOWER EXTREMITY;  Surgeon: Maeola Harman, MD;  Location: [facility] INVASIVE CV LAB;  Service: Cardiovascular;  Laterality: N/A;   ABDOMINAL SURGERY     for diverticulitis; also removed appendix   AMPUTATION Left 06/24/2023   Procedure: LEFT BELOW THE KNEE AMPUTATION;  Surgeon: Nadara Mustard, MD;  Location: [facility] OR;  Service: Orthopedics;  Laterality: Left;   APPENDECTOMY     CARDIAC CATHETERIZATION  2018   CATARACT EXTRACTION     CATARACT EXTRACTION, BILATERAL     CHOLECYSTECTOMY N/A 10/12/2017   Procedure: LAPAROSCOPIC CHOLECYSTECTOMY;   Surgeon: Abigail Miyamoto, MD;  Location: [facility] OR;  Service: General;  Laterality: N/A;   CORONARY ARTERY BYPASS GRAFT  2009   ERCP N/A 12/21/2018   Procedure: ENDOSCOPIC RETROGRADE CHOLANGIOPANCREATOGRAPHY (ERCP);  Surgeon: Jeani Hawking, MD;  Location: [facility] ENDOSCOPY;  Service: Endoscopy;  Laterality: N/A;   ESOPHAGOGASTRODUODENOSCOPY (EGD) WITH PROPOFOL N/A 12/17/2018   Procedure: ESOPHAGOGASTRODUODENOSCOPY (EGD) WITH PROPOFOL;  Surgeon: Jeani Hawking, MD;  Location: [facility] ENDOSCOPY;  Service: Endoscopy;  Laterality: N/A;   EUS Left 12/17/2018   Procedure: UPPER ENDOSCOPIC ULTRASOUND (EUS) LINEAR;  Surgeon: Jeani Hawking, MD;  Location: [facility] ENDOSCOPY;  Service: Endoscopy;  Laterality: Left;   EYE SURGERY     "for bleeding in eye"   HERNIA REPAIR     LEFT HEART CATH AND CORONARY ANGIOGRAPHY N/A 11/04/2016   Procedure: Left Heart Cath and Coronary Angiography;  Surgeon: Yates Decamp, MD;  Location: [facility] INVASIVE CV LAB;  Service: Cardiovascular;  Laterality: N/A;   LOWER EXTREMITY ANGIOGRAPHY N/A 11/04/2016   Procedure: Lower Extremity Angiography;  Surgeon: Yates Decamp, MD;  Location: [facility] INVASIVE CV LAB;  Service: Cardiovascular;  Laterality: N/A;   LOWER EXTREMITY ANGIOGRAPHY N/A 01/03/2020   Procedure: LOWER EXTREMITY ANGIOGRAPHY;  Surgeon: Yates Decamp, MD;  Location: [facility] INVASIVE CV LAB;  Service:  Cardiovascular;  Laterality: N/A;   LOWER EXTREMITY ANGIOGRAPHY Bilateral 01/14/2022   Procedure: Lower Extremity Angiography;  Surgeon: Yates Decamp, MD;  Location: [facility] INVASIVE CV LAB;  Service: Cardiovascular;  Laterality: Bilateral;   LUMBAR LAMINECTOMY/DECOMPRESSION MICRODISCECTOMY Bilateral 11/09/2020   Procedure: Laminectomy and Foraminotomy - bilateral - Lumbar Four-Five.;  Surgeon: Julio Sicks, MD;  Location: [facility] OR;  Service: Neurosurgery;  Laterality: Bilateral;  posterior   PROSTATE SURGERY     REMOVAL OF STONES  12/21/2018   Procedure: REMOVAL OF STONES;  Surgeon: Jeani Hawking, MD;  Location: [facility] ENDOSCOPY;   Service: Endoscopy;;   SPHINCTEROTOMY  12/21/2018   Procedure: Dennison Mascot;  Surgeon: Jeani Hawking, MD;  Location: [facility] ENDOSCOPY;  Service: Endoscopy;;   Patient Active Problem List   Diagnosis Date Noted   Osteomyelitis of great toe of left foot (HCC) 06/20/2023   Gangrene of toe of left foot (HCC) 06/19/2023   Normocytic anemia 06/19/2023   Eschar of left great toe 05/22/2023   Near syncope 06/15/2021   Dizziness 06/15/2021   Lumbar stenosis with neurogenic claudication 11/09/2020   CKD stage 3 due to type 2 diabetes mellitus (HCC) 12/14/2019   Asymptomatic bilateral carotid artery stenosis 12/14/2019   Abdominal distention    CAD s/p CABG in 2009 12/14/2018   Acute renal failure superimposed on stage 3 chronic kidney disease (HCC) 12/14/2018   UTI (urinary tract infection) 12/14/2018   HLD (hyperlipidemia) 12/14/2018   Sepsis (HCC) 12/14/2018   Abnormal LFTs    S/P CABG (coronary artery bypass graft) 12/06/2018   Asymptomatic stenosis of right carotid artery 12/06/2018   Claudication in peripheral vascular disease (HCC) 12/06/2018   Orthostatic hypotension due to diabetic dysautonomia 12/06/2018   Elevated liver enzymes 12/06/2018   Abdominal pain 10/12/2018   Abnormal liver function tests 10/12/2018   Foreign body in stomach 10/12/2018   Primary hypertension    GERD (gastroesophageal reflux disease)    Diabetes mellitus without complication (HCC)    Atherosclerosis of native coronary artery of native heart without angina pectoris     PCP: Eloisa Northern, MD   REFERRING PROVIDER: Nadara Mustard, MD  ONSET DATE: 09/14/2023 Prosthesis delivery  REFERRING DIAG: X52.841 (ICD-10-CM) - Left below-knee amputee   THERAPY DIAG:  Other abnormalities of gait and mobility  Unsteadiness on feet  Muscle weakness (generalized)  Impaired functional mobility, balance, gait, and endurance  Dizziness and giddiness  Rationale for Evaluation and Treatment:  Rehabilitation  SUBJECTIVE:   SUBJECTIVE STATEMENT: He has been wearing liner 2 hours 3x/day with underliner.  No issues.  He has worn shrinker when not wearing liner.  Pt accompanied by: family member  PERTINENT HISTORY: Left TTA 06/24/23, DM2, retinopathy, GERD, HLD, HTN, CAD, CABG 2009, PAD, prostate CA, CKD stage 3b, arthritis, Laminectomy & Foraminotomy bilateral L4-5,   PAIN:  Are you having pain? No  PRECAUTIONS: Fall  WEIGHT BEARING RESTRICTIONS: No  FALLS: Has patient fallen in last 6 months? Yes. Number of falls 1 no injuries  LIVING ENVIRONMENT: Lives with: lives with their family Lives in: House Home Access: Ramped entrance and single step Home layout: Two level, 1/2 bath on main level, and bedroom Stairs: Yes: Internal: 14 steps; on left going up and can reach both and External: 1+1 steps; none Has following equipment at home: Quad cane small base, Environmental consultant - 2 wheeled, Wheelchair (manual), shower chair, Shower bench, and bed side commode  OCCUPATION: retired from [employer]  PLOF: Independent used cane & walker   PATIENT GOALS: to  use prosthesis to walk in home & community, family wants to be safe,  go upstairs to shower  OBJECTIVE:   POSTURE: Evaluation on 09/16/2023:  rounded shoulders, forward head, flexed trunk , and weight shift right  LOWER EXTREMITY ROM:  ROM P:passive  A:active Right 09/24/23 Left 09/24/23  Hip flexion    Hip extension Maisie Fus -5* Thomas -11*  Hip abduction    Hip adduction    Hip internal rotation    Hip external rotation    Knee flexion    Knee extension  Supine P: -7*  Ankle dorsiflexion    Ankle plantarflexion    Ankle inversion    Ankle eversion     (Blank rows = not tested)  LOWER EXTREMITY MMT:  MMT Left 09/24/23  Hip flexion   Hip extension Sidelying 3-/5  Hip abduction Sidleying 3/5  Hip adduction   Hip internal rotation   Hip external rotation   Knee flexion   Knee extension 3-/5  Ankle  dorsiflexion   Ankle plantarflexion   Ankle inversion   Ankle eversion   At Evaluation all strength testing is grossly seated and functionally standing / gait. (Blank rows = not tested)  TRANSFERS: Evaluation on 09/16/2023:  Sit to stand: SBA 20" w/c with armrest to RW limited use of LLE Stand to sit: SBA RW to 20" w/c with armrest limited use of LLE  FUNCTIONAL TESTs:  09/24/2023:  Patient able to stand up from 20" w/c with armrest with RW for stabilization with supervision.  Patient able to maintain upright with RW support without assistance.  Patient able to balance without upper extremity support with minA for 60 seconds.  Deferred at evaluation on 09/16/2023 as limb too edematous to don prosthesis  GAIT: 09/24/2023:  Patient ambulated 50 feet with RW with CGA.  Initially patient walking with a step to pattern with minimal weight shift onto prosthesis.   Deferred at evaluation on 09/16/2023 as limb too edematous to don prosthesis Gait pattern:  Distance walked:  Assistive device utilized:  Level of assistance:  Comments:   CURRENT PROSTHETIC WEAR ASSESSMENT: Evaluation on 09/16/2023:  Patient is dependent with: skin check, residual limb care, care of non-amputated limb, prosthetic cleaning, ply sock cleaning, correct ply sock adjustment, proper wear schedule/adjustment, and proper weight-bearing schedule/adjustment Donning prosthesis: Max A Doffing prosthesis: Min A Prosthetic wear tolerance: 30-45 min 2 days ago when prosthesis delivered.  Prosthetic weight bearing tolerance: 3 minutes partial weight bearing trying to engage pin suspension.  Pt reports distal lateral limb pain from wear 2 days ago.  Edema: pitting edema  Residual limb condition: scab dry over tibial crest 5cm wide X 15 cm long.  Dry skin, normal color & temperature. Cylindrical shape Prosthetic description: silicon liner with anterior portion 9mm thick, secondary pelite foam liner, total contact socket,      TODAY'S TREATMENT:  DATE:  09/24/2023: Prosthetic Training with left Transtibial Amputation: Patient's family forgot liner.  While his daughter went home to get the liner PT assessed range of motion and manual muscle test noted above. PT also educated patient and granddaughter on making a list for traveling for all things associated with the prosthesis including soaps that he cleans the liner.  PT recommended checking list prior to departure for trip and prior to her departure to return home.  Patient and granddaughter verbalized understanding PT demo and verbal cues on urinating standing up with RW support using 1 hand at a time to pull pants up and down.  Also washing hands with anterior stomach or pelvis in contact with the sink.  PT also verbal cues on how to don long pants with a transtibial prosthesis.  Patient, daughter and granddaughter verbalized understanding. Patient able to stand up from 20" w/c with armrest with RW for stabilization with supervision.  Patient able to maintain upright with RW support without assistance.  Patient able to balance without upper extremity support with minA for 60 seconds. Patient ambulated 50 feet with RW with CGA.  Initially patient walking with a step to pattern with minimal weight shift onto prosthesis.  PT demo and verbal cues on step through pattern.  Patient able to return demonstration with RW support. PT demo and verbal cues on negotiating stairs per family request due to an outing this weekend.  Patient negotiated 3 steps forward facing with 2 rails step to pattern with contact-guard assistance.  Patient also negotiated 3 steps side facing bilateral upper extremity support on left ascending rail with contact-guard assistance.  Patient, daughter and granddaughter verbalized understanding of technique. PT demo and verbal cues on a  car transfer using RW.  Patient able to safely perform car transfer with RW with supervision safely. PT recommended continuing wear of both liner and prosthesis now for 2 hours 3 times per day.  His next appointment is greater than 1 week out.  PT recommended after 1 week if the wound on his anterior tibia does not look irritated or increased in size, then increase wear to 3 hours 3 times per day with the prosthesis off 2 hours at a time between the wears.  Patient, daughter and granddaughter verbalized understanding    TREATMENT:                                                                                                                             DATE:  09/16/2023:  PATIENT EDUCATION: PATIENT EDUCATED ON FOLLOWING PROSTHETIC CARE: Education details:   Skin check, Residual limb care, Prosthetic cleaning, Correct ply sock adjustment, Propper donning, and Proper wear schedule/adjustment Prosthetic wear tolerance: liner only initially 2 hours 3x/day, 7 days/week Person educated: Patient and Child(ren) Education method: Explanation, Demonstration, Tactile cues, and Verbal cues Education comprehension: dtr & granddaughter verbalized understanding, verbal cues required, tactile cues required, and needs further education  HOME EXERCISE PROGRAM:  ASSESSMENT:  CLINICAL IMPRESSION: Patient's residual limb has less edema today which enabled [place] to don the prosthesis.  Patient appears safe for standing balance with bilateral upper extremity support on RW or counter.  Patient should have family with him for gait at this time to prevent potential falls and injury.  Patient and family appear to understand how to negotiate stairs with the prosthesis and perform car transfers with RW.  Patient's family appear to understand initial prosthetic care instructions.   OBJECTIVE IMPAIRMENTS: Abnormal gait, decreased activity tolerance, decreased balance, decreased endurance, decreased knowledge of condition,  decreased knowledge of use of DME, decreased mobility, difficulty walking, decreased ROM, decreased strength, increased edema, prosthetic dependency , and pain.   ACTIVITY LIMITATIONS: carrying, lifting, sitting, standing, stairs, transfers, and locomotion level  PARTICIPATION LIMITATIONS: standing ADLS, cleaning, community activity, and household activity  PERSONAL FACTORS: Age, Fitness, Past/current experiences, Time since onset of injury/illness/exacerbation, and 3+ comorbidities: see PMH  are also affecting patient's functional outcome.   REHAB POTENTIAL: Good  CLINICAL DECISION MAKING: Evolving/moderate complexity  EVALUATION COMPLEXITY: Moderate   GOALS: Goals reviewed with patient? Yes  SHORT TERM GOALS: Target date: 10/15/2023  Patient donnes prosthesis modified independent & family verbalizes proper cleaning. Baseline: SEE OBJECTIVE DATA Goal status: INITIAL 2.  Patient tolerates prosthesis >8 hrs total /day without skin issues increasing or limb pain >4/10 after standing. Baseline: SEE OBJECTIVE DATA Goal status: INITIAL  3.  Patient able to reach 7" and look over both shoulders with RW support with supervision. Baseline: SEE OBJECTIVE DATA Goal status: INITIAL  4. Patient ambulates 62' with RW & prosthesis with supervision. Baseline: SEE OBJECTIVE DATA Goal status: INITIAL  5. Patient negotiates ramps & curbs with RW & prosthesis with minA. Baseline: SEE OBJECTIVE DATA Goal status: INITIAL  LONG TERM GOALS: Target date: 12/10/2023  Patient & family demonstrate & verbalize understanding of prosthetic care to enable safe utilization of prosthesis. Baseline: SEE OBJECTIVE DATA Goal status: INITIAL  Patient tolerates prosthesis wear >90% of awake hours without skin or limb pain issues. Baseline: SEE OBJECTIVE DATA Goal status: INITIAL  Task of Berg Balance with RW support >/= 30/56 to indicate lower fall risk with standing ADLs. Baseline: SEE OBJECTIVE  DATA Goal status: INITIAL  Patient ambulates 8' modified independent &  >200' family supervision with prosthesis & RW. Baseline: SEE OBJECTIVE DATA Goal status: INITIAL  Patient negotiates ramps, curbs & stairs with single rail with prosthesis & RW with family assistance safely.  Baseline: SEE OBJECTIVE DATA Goal status: INITIAL   PLAN:  PT FREQUENCY: 2x/week  PT DURATION: 12 weeks  PLANNED INTERVENTIONS: 97164- PT Re-evaluation, 97110-Therapeutic exercises, 97530- Therapeutic activity, [id number]- Neuromuscular re-education, [phone]- Self Care, [id number]- Gait training, [phone]- Prosthetic training, Patient/Family education, Balance training, Stair training, DME instructions, and physical performance testing  PLAN FOR NEXT SESSION: Check wound on residual limb and progress wear as indicated, instruct patient and family on how to adjust ply socks, HEP at sink.  Prosthetic gait with RW.   Vladimir Faster, PT, DPT 09/24/2023, 4:27 PM  Date of referral: 08/31/2023 Referring provider: Aldean Baker, MD Referring diagnosis? M84.132 (ICD-10-CM) - Left below-knee amputee  Treatment diagnosis? (if different than referring diagnosis)  Other abnormalities of gait and mobility  ICD-10-CM: R26.89   Unsteadiness on feet  ICD-10-CM: R26.81   Muscle weakness (generalized)  ICD-10-CM: M62.81   Impaired functional mobility, balance, gait, and endurance  ICD-10-CM: Z74.09   What was this (referring dx) caused by? Surgery (Type: left BKA)  Ashby Dawes of  Condition: Initial Onset (within last 3 months) prosthesis delivery    Laterality: Lt  Current Functional Measure Score: Other standing balance supervision static stance 1 min.   Objective measurements identify impairments when they are compared to normal values, the uninvolved extremity, and prior level of function.  [x]  Yes  []  No  Objective assessment of functional ability: Moderate functional limitations   Briefly describe symptoms: patient is  dependent in prosthetic care & use.  He has impaired mobility. Patient is deconditioned from prolonged limited activity with wound, then amputation.   How did symptoms start: infected wound led to amputation  Average pain intensity:  Last 24 hours: 0  Past week: 0  How often does the pt experience symptoms? Constantly  How much have the symptoms interfered with usual daily activities? Extremely  How has condition changed since care began at this facility? NA - initial visit  In general, how is the patients overall health? Good   BACK PAIN (STarT Back Screening Tool) No

## 2023-10-07 ENCOUNTER — Ambulatory Visit: Payer: Medicare Other | Admitting: Physical Therapy

## 2023-10-07 ENCOUNTER — Encounter: Payer: Self-pay | Admitting: Physical Therapy

## 2023-10-07 ENCOUNTER — Encounter: Payer: Medicare Other | Admitting: Physical Therapy

## 2023-10-07 DIAGNOSIS — Z7409 Other reduced mobility: Secondary | ICD-10-CM | POA: Diagnosis not present

## 2023-10-07 DIAGNOSIS — M6281 Muscle weakness (generalized): Secondary | ICD-10-CM

## 2023-10-07 DIAGNOSIS — R2689 Other abnormalities of gait and mobility: Secondary | ICD-10-CM | POA: Diagnosis not present

## 2023-10-07 DIAGNOSIS — R2681 Unsteadiness on feet: Secondary | ICD-10-CM | POA: Diagnosis not present

## 2023-10-07 NOTE — Patient Instructions (Signed)
Hanger Socks: 1-ply is yellow color at top, 3-ply is green at top, 5-ply is navy blue at top How many ply you need depends on your limb size.  You should have even pressure on your limb when standing & walking.  Guidance points: 1. How ease it goes on? Should be some resistance. Too few it goes on too easily. Too many it takes a lot of work to get it on. 2. How many clicks you get. Especially clicks in sitting. 3. After standing or walking, check knee cap. Bottom should be just under the front lip.  Too few bottom of knee cap sits on indention. Too many bottom is above front lip. 4. Have your feet beside each other & hips over feet. Place hands on your waist. Pelvis Should be level. Too few prosthetic side will be low. Too many prosthetic side will be high.    Get ply socks correct before you leave the house. Take extra socks with you. Take one 3-ply and two 1-ply with you. This is in addition to what you are wearing.   

## 2023-10-07 NOTE — Therapy (Signed)
 OUTPATIENT PHYSICAL THERAPY PROSTHETIC TREATMENT   Patient Name: Justin [name] MRN: [id number] DOB:Apr 26, 1933, 89 y.o., male Today's Date: 10/07/2023  END OF SESSION:  PT End of Session - 10/07/23 1510     Visit Number 3    Number of Visits 25    Date for PT Re-Evaluation 12/10/23    Authorization Type [employer] Medicare    Authorization Time Period $20 co-pay    Progress Note Due on Visit 10    PT Start Time 1510    PT Stop Time 1600    PT Time Calculation (min) 50 min    Equipment Utilized During Treatment Gait belt    Activity Tolerance Patient tolerated treatment well    Behavior During Therapy WFL for tasks assessed/performed               Past Medical History:  Diagnosis Date   Anxiety    Arthritis    Carotid arterial disease (HCC)    CKD (chronic kidney disease)    Coronary artery disease    Diabetes mellitus without complication (HCC)    Diabetic retinopathy (HCC)    NPDR OU   Diverticulitis    Dyspnea    GERD (gastroesophageal reflux disease)    History of kidney stones 2021   Hypercholesteremia    Hypertension    Hypertensive retinopathy    OU   Myocardial infarction [facility]) 2009   Pneumonia    as a child   Prostate cancer (HCC)    UTI (lower urinary tract infection)    Past Surgical History:  Procedure Laterality Date   ABDOMINAL AORTOGRAM W/LOWER EXTREMITY N/A 06/22/2023   Procedure: ABDOMINAL AORTOGRAM W/LOWER EXTREMITY;  Surgeon: Sheree Penne Bruckner, MD;  Location: [facility] INVASIVE CV LAB;  Service: Cardiovascular;  Laterality: N/A;   ABDOMINAL SURGERY     for diverticulitis; also removed appendix   AMPUTATION Left 06/24/2023   Procedure: LEFT BELOW THE KNEE AMPUTATION;  Surgeon: Harden Jerona GAILS, MD;  Location: [facility] OR;  Service: Orthopedics;  Laterality: Left;   APPENDECTOMY     CARDIAC CATHETERIZATION  2018   CATARACT EXTRACTION     CATARACT EXTRACTION, BILATERAL     CHOLECYSTECTOMY N/A 10/12/2017   Procedure: LAPAROSCOPIC CHOLECYSTECTOMY;   Surgeon: Vernetta Berg, MD;  Location: [facility] OR;  Service: General;  Laterality: N/A;   CORONARY ARTERY BYPASS GRAFT  2009   ERCP N/A 12/21/2018   Procedure: ENDOSCOPIC RETROGRADE CHOLANGIOPANCREATOGRAPHY (ERCP);  Surgeon: Rollin Dover, MD;  Location: [facility] ENDOSCOPY;  Service: Endoscopy;  Laterality: N/A;   ESOPHAGOGASTRODUODENOSCOPY (EGD) WITH PROPOFOL  N/A 12/17/2018   Procedure: ESOPHAGOGASTRODUODENOSCOPY (EGD) WITH PROPOFOL ;  Surgeon: Rollin Dover, MD;  Location: [facility] ENDOSCOPY;  Service: Endoscopy;  Laterality: N/A;   EUS Left 12/17/2018   Procedure: UPPER ENDOSCOPIC ULTRASOUND (EUS) LINEAR;  Surgeon: Rollin Dover, MD;  Location: [facility] ENDOSCOPY;  Service: Endoscopy;  Laterality: Left;   EYE SURGERY     for bleeding in eye   HERNIA REPAIR     LEFT HEART CATH AND CORONARY ANGIOGRAPHY N/A 11/04/2016   Procedure: Left Heart Cath and Coronary Angiography;  Surgeon: Gordy Bergamo, MD;  Location: [facility] INVASIVE CV LAB;  Service: Cardiovascular;  Laterality: N/A;   LOWER EXTREMITY ANGIOGRAPHY N/A 11/04/2016   Procedure: Lower Extremity Angiography;  Surgeon: Gordy Bergamo, MD;  Location: [facility] INVASIVE CV LAB;  Service: Cardiovascular;  Laterality: N/A;   LOWER EXTREMITY ANGIOGRAPHY N/A 01/03/2020   Procedure: LOWER EXTREMITY ANGIOGRAPHY;  Surgeon: Bergamo Gordy, MD;  Location: [facility] INVASIVE CV LAB;  Service: Cardiovascular;  Laterality: N/A;   LOWER EXTREMITY ANGIOGRAPHY Bilateral 01/14/2022   Procedure: Lower Extremity Angiography;  Surgeon: Ladona Heinz, MD;  Location: [facility] INVASIVE CV LAB;  Service: Cardiovascular;  Laterality: Bilateral;   LUMBAR LAMINECTOMY/DECOMPRESSION MICRODISCECTOMY Bilateral 11/09/2020   Procedure: Laminectomy and Foraminotomy - bilateral - Lumbar Four-Five.;  Surgeon: Louis Shove, MD;  Location: [facility] OR;  Service: Neurosurgery;  Laterality: Bilateral;  posterior   PROSTATE SURGERY     REMOVAL OF STONES  12/21/2018   Procedure: REMOVAL OF STONES;  Surgeon: Rollin Dover, MD;  Location: [facility] ENDOSCOPY;   Service: Endoscopy;;   SPHINCTEROTOMY  12/21/2018   Procedure: ANNETT;  Surgeon: Rollin Dover, MD;  Location: [facility] ENDOSCOPY;  Service: Endoscopy;;   Patient Active Problem List   Diagnosis Date Noted   Osteomyelitis of great toe of left foot (HCC) 06/20/2023   Gangrene of toe of left foot (HCC) 06/19/2023   Normocytic anemia 06/19/2023   Eschar of left great toe 05/22/2023   Near syncope 06/15/2021   Dizziness 06/15/2021   Lumbar stenosis with neurogenic claudication 11/09/2020   CKD stage 3 due to type 2 diabetes mellitus (HCC) 12/14/2019   Asymptomatic bilateral carotid artery stenosis 12/14/2019   Abdominal distention    CAD s/p CABG in 2009 12/14/2018   Acute renal failure superimposed on stage 3 chronic kidney disease (HCC) 12/14/2018   UTI (urinary tract infection) 12/14/2018   HLD (hyperlipidemia) 12/14/2018   Sepsis (HCC) 12/14/2018   Abnormal LFTs    S/P CABG (coronary artery bypass graft) 12/06/2018   Asymptomatic stenosis of right carotid artery 12/06/2018   Claudication in peripheral vascular disease (HCC) 12/06/2018   Orthostatic hypotension due to diabetic dysautonomia 12/06/2018   Elevated liver enzymes 12/06/2018   Abdominal pain 10/12/2018   Abnormal liver function tests 10/12/2018   Foreign body in stomach 10/12/2018   Primary hypertension    GERD (gastroesophageal reflux disease)    Diabetes mellitus without complication (HCC)    Atherosclerosis of native coronary artery of native heart without angina pectoris     PCP: Caleen Dirks, MD   REFERRING PROVIDER: Harden Jerona GAILS, MD  ONSET DATE: 09/14/2023 Prosthesis delivery  REFERRING DIAG: S10.487 (ICD-10-CM) - Left below-knee amputee   THERAPY DIAG:  Other abnormalities of gait and mobility  Unsteadiness on feet  Muscle weakness (generalized)  Impaired functional mobility, balance, gait, and endurance  Rationale for Evaluation and Treatment: Rehabilitation  SUBJECTIVE:   SUBJECTIVE  STATEMENT: He has 3hrs on 2 hrs 3x/day.  No changes to scab which is sore.   Pt accompanied by: family member  PERTINENT HISTORY: Left TTA 06/24/23, DM2, retinopathy, GERD, HLD, HTN, CAD, CABG 2009, PAD, prostate CA, CKD stage 3b, arthritis, Laminectomy & Foraminotomy bilateral L4-5,   PAIN:  Are you having pain? No  PRECAUTIONS: Fall  WEIGHT BEARING RESTRICTIONS: No  FALLS: Has patient fallen in last 6 months? Yes. Number of falls 1 no injuries  LIVING ENVIRONMENT: Lives with: lives with their family Lives in: House Home Access: Ramped entrance and single step Home layout: Two level, 1/2 bath on main level, and bedroom Stairs: Yes: Internal: 14 steps; on left going up and can reach both and External: 1+1 steps; none Has following equipment at home: Quad cane small base, Environmental Consultant - 2 wheeled, Wheelchair (manual), shower chair, Shower bench, and bed side commode  OCCUPATION: retired from naval architect  PLOF: Independent used cane & walker   PATIENT GOALS: to use prosthesis to walk in home & community, family  wants to be safe,  go upstairs to shower  OBJECTIVE:   POSTURE: Evaluation on 09/16/2023:  rounded shoulders, forward head, flexed trunk , and weight shift right  LOWER EXTREMITY ROM:  ROM P:passive  A:active Right 09/24/23 Left 09/24/23  Hip flexion    Hip extension Debby -5* Thomas -11*  Hip abduction    Hip adduction    Hip internal rotation    Hip external rotation    Knee flexion    Knee extension  Supine P: -7*  Ankle dorsiflexion    Ankle plantarflexion    Ankle inversion    Ankle eversion     (Blank rows = not tested)  LOWER EXTREMITY MMT:  MMT Left 09/24/23  Hip flexion   Hip extension Sidelying 3-/5  Hip abduction Sidleying 3/5  Hip adduction   Hip internal rotation   Hip external rotation   Knee flexion   Knee extension 3-/5  Ankle dorsiflexion   Ankle plantarflexion   Ankle inversion   Ankle eversion   At Evaluation all strength  testing is grossly seated and functionally standing / gait. (Blank rows = not tested)  TRANSFERS: Evaluation on 09/16/2023:  Sit to stand: SBA 20 w/c with armrest to RW limited use of LLE Stand to sit: SBA RW to 20 w/c with armrest limited use of LLE  FUNCTIONAL TESTs:  09/24/2023:  Patient able to stand up from 20 w/c with armrest with RW for stabilization with supervision.  Patient able to maintain upright with RW support without assistance.  Patient able to balance without upper extremity support with minA for 60 seconds.  Deferred at evaluation on 09/16/2023 as limb too edematous to don prosthesis  GAIT: 09/24/2023:  Patient ambulated 50 feet with RW with CGA.  Initially patient walking with a step to pattern with minimal weight shift onto prosthesis.   Deferred at evaluation on 09/16/2023 as limb too edematous to don prosthesis Gait pattern:  Distance walked:  Assistive device utilized:  Level of assistance:  Comments:   CURRENT PROSTHETIC WEAR ASSESSMENT: Evaluation on 09/16/2023:  Patient is dependent with: skin check, residual limb care, care of non-amputated limb, prosthetic cleaning, ply sock cleaning, correct ply sock adjustment, proper wear schedule/adjustment, and proper weight-bearing schedule/adjustment Donning prosthesis: Max A Doffing prosthesis: Min A Prosthetic wear tolerance: 30-45 min 2 days ago when prosthesis delivered.  Prosthetic weight bearing tolerance: 3 minutes partial weight bearing trying to engage pin suspension.  Pt reports distal lateral limb pain from wear 2 days ago.  Edema: pitting edema  Residual limb condition: scab dry over tibial crest 5cm wide X 15 cm long.  Dry skin, normal color & temperature. Cylindrical shape Prosthetic description: silicon liner with anterior portion 9mm thick, secondary pelite foam liner, total contact socket,     TODAY'S TREATMENT:  DATE:  10/07/2023: Prosthetic Training with left Transtibial Amputation: Patient arrived today amb with RW (no w/c) with family supervision.   Patient's wound had loose distal scab that PT removed with tweezers.  His wound on tibial crest is now 6-25mm wide with 2 superficial (no longer thick) proximal scabs.  No signs of infection.  PT recommended continuing with Vivewear under liner sock against wound. Increase wear to 4 hrs on, 2 hours off, 2x/day then 3rd wear until bed time.  Switch and clean liners when off the second time.  Pt & granddaughter verbalized understanding.   PT instructed in adjusting ply socks with HO, verbal and demo cues.  Don & ambulate with too few, too many and correct ply fit.  Instructed that ply socks depend on his limb volume.  Over time he will start with more socks in morning which is slow process and during day he may need to add socks as weight bearing can pump fluid out of limb.  Pt and granddaughter verbalized understanding.   Pt amb 25' X 3 and 94' X 2 with RW with supervision.  PT demo & verbal cues on sit /stand chairs without armrests. Pt return demo with supervision.   PT demo & verbal cues on neg curb and ramp.  Pt neg 6.5 curb and 12* incline with RW with CGA.      TREATMENT:                                                                                                                             DATE:  09/24/2023: Prosthetic Training with left Transtibial Amputation: Patient's family forgot liner.  While his daughter went home to get the liner PT assessed range of motion and manual muscle test noted above. PT also educated patient and granddaughter on making a list for traveling for all things associated with the prosthesis including soaps that he cleans the liner.  PT recommended checking list prior to departure for trip and prior to her departure to return home.  Patient and granddaughter verbalized understanding PT  demo and verbal cues on urinating standing up with RW support using 1 hand at a time to pull pants up and down.  Also washing hands with anterior stomach or pelvis in contact with the sink.  PT also verbal cues on how to don long pants with a transtibial prosthesis.  Patient, daughter and granddaughter verbalized understanding. Patient able to stand up from 20 w/c with armrest with RW for stabilization with supervision.  Patient able to maintain upright with RW support without assistance.  Patient able to balance without upper extremity support with minA for 60 seconds. Patient ambulated 50 feet with RW with CGA.  Initially patient walking with a step to pattern with minimal weight shift onto prosthesis.  PT demo and verbal cues on step through pattern.  Patient able to return demonstration with RW support. PT demo and verbal cues on negotiating stairs per family request  due to an outing this weekend.  Patient negotiated 3 steps forward facing with 2 rails step to pattern with contact-guard assistance.  Patient also negotiated 3 steps side facing bilateral upper extremity support on left ascending rail with contact-guard assistance.  Patient, daughter and granddaughter verbalized understanding of technique. PT demo and verbal cues on a car transfer using RW.  Patient able to safely perform car transfer with RW with supervision safely. PT recommended continuing wear of both liner and prosthesis now for 2 hours 3 times per day.  His next appointment is greater than 1 week out.  PT recommended after 1 week if the wound on his anterior tibia does not look irritated or increased in size, then increase wear to 3 hours 3 times per day with the prosthesis off 2 hours at a time between the wears.  Patient, daughter and granddaughter verbalized understanding    TREATMENT:                                                                                                                             DATE:   09/16/2023:  PATIENT EDUCATION: PATIENT EDUCATED ON FOLLOWING PROSTHETIC CARE: Education details:   Skin check, Residual limb care, Prosthetic cleaning, Correct ply sock adjustment, Propper donning, and Proper wear schedule/adjustment Prosthetic wear tolerance: liner only initially 2 hours 3x/day, 7 days/week Person educated: Patient and Child(ren) Education method: Explanation, Demonstration, Tactile cues, and Verbal cues Education comprehension: dtr & granddaughter verbalized understanding, verbal cues required, tactile cues required, and needs further education  HOME EXERCISE PROGRAM:  ASSESSMENT:  CLINICAL IMPRESSION: Patient's residual limb has less edema today which enabled us  to don the prosthesis.  Patient appears safe for standing balance with bilateral upper extremity support on RW or counter.  Patient should have family with him for gait at this time to prevent potential falls and injury.  Patient and family appear to understand how to negotiate stairs with the prosthesis and perform car transfers with RW.  Patient's family appear to understand initial prosthetic care instructions.   OBJECTIVE IMPAIRMENTS: Abnormal gait, decreased activity tolerance, decreased balance, decreased endurance, decreased knowledge of condition, decreased knowledge of use of DME, decreased mobility, difficulty walking, decreased ROM, decreased strength, increased edema, prosthetic dependency , and pain.   ACTIVITY LIMITATIONS: carrying, lifting, sitting, standing, stairs, transfers, and locomotion level  PARTICIPATION LIMITATIONS: standing ADLS, cleaning, community activity, and household activity  PERSONAL FACTORS: Age, Fitness, Past/current experiences, Time since onset of injury/illness/exacerbation, and 3+ comorbidities: see PMH  are also affecting patient's functional outcome.   REHAB POTENTIAL: Good  CLINICAL DECISION MAKING: Evolving/moderate complexity  EVALUATION COMPLEXITY:  Moderate   GOALS: Goals reviewed with patient? Yes  SHORT TERM GOALS: Target date: 10/15/2023  Patient donnes prosthesis modified independent & family verbalizes proper cleaning. Baseline: SEE OBJECTIVE DATA Goal status: MET 10/07/2023 2.  Patient tolerates prosthesis >8 hrs total /day without skin issues increasing or limb pain >4/10 after standing. Baseline: SEE OBJECTIVE  DATA Goal status: MET 10/07/2023  3.  Patient able to reach 7 and look over both shoulders with RW support with supervision. Baseline: SEE OBJECTIVE DATA Goal status: Ongoing 10/07/2023  4. Patient ambulates 57' with RW & prosthesis with supervision. Baseline: SEE OBJECTIVE DATA Goal status: MET 10/07/2023  5. Patient negotiates ramps & curbs with RW & prosthesis with minA. Baseline: SEE OBJECTIVE DATA Goal status: Ongoing 10/07/2023  LONG TERM GOALS: Target date: 12/10/2023  Patient & family demonstrate & verbalize understanding of prosthetic care to enable safe utilization of prosthesis. Baseline: SEE OBJECTIVE DATA Goal status: Ongoing 10/07/2023  Patient tolerates prosthesis wear >90% of awake hours without skin or limb pain issues. Baseline: SEE OBJECTIVE DATA Goal status: Ongoing 10/07/2023  Task of [employer] with RW support >/= 30/56 to indicate lower fall risk with standing ADLs. Baseline: SEE OBJECTIVE DATA Goal status: Ongoing 10/07/2023  Patient ambulates 35' modified independent &  >200' family supervision with prosthesis & RW. Baseline: SEE OBJECTIVE DATA Goal status: Ongoing 10/07/2023  Patient negotiates ramps, curbs & stairs with single rail with prosthesis & RW with family assistance safely.  Baseline: SEE OBJECTIVE DATA Goal status: Ongoing 10/07/2023   PLAN:  PT FREQUENCY: 2x/week  PT DURATION: 12 weeks  PLANNED INTERVENTIONS: 97164- PT Re-evaluation, 97110-Therapeutic exercises, 97530- Therapeutic activity, 97112- Neuromuscular re-education, 97535- Self Care, [id number]- Gait training, [phone]-  Prosthetic training, Patient/Family education, Balance training, Stair training, DME instructions, and physical performance testing  PLAN FOR NEXT SESSION: Check wound on residual limb and progress wear as indicated, balance with HEP at sink.  Prosthetic gait with RW.   Grayce Spatz, PT, DPT 10/07/2023, 4:19 PM  Date of referral: 08/31/2023 Referring provider: Jerona Sage, MD Referring diagnosis? S10.487 (ICD-10-CM) - Left below-knee amputee  Treatment diagnosis? (if different than referring diagnosis)  Other abnormalities of gait and mobility  ICD-10-CM: R26.89   Unsteadiness on feet  ICD-10-CM: R26.81   Muscle weakness (generalized)  ICD-10-CM: M62.81   Impaired functional mobility, balance, gait, and endurance  ICD-10-CM: Z74.09   What was this (referring dx) caused by? Surgery (Type: left BKA)  Nature of Condition: Initial Onset (within last 3 months) prosthesis delivery    Laterality: Lt  Current Functional Measure Score: Other standing balance supervision static stance 1 min.   Objective measurements identify impairments when they are compared to normal values, the uninvolved extremity, and prior level of function.  [x]  Yes  []  No  Objective assessment of functional ability: Moderate functional limitations   Briefly describe symptoms: patient is dependent in prosthetic care & use.  He has impaired mobility. Patient is deconditioned from prolonged limited activity with wound, then amputation.   How did symptoms start: infected wound led to amputation  Average pain intensity:  Last 24 hours: 0  Past week: 0  How often does the pt experience symptoms? Constantly  How much have the symptoms interfered with usual daily activities? Extremely  How has condition changed since care began at this facility? NA - initial visit  In general, how is the patients overall health? Good   BACK PAIN (STarT Back Screening Tool) No

## 2023-10-12 ENCOUNTER — Encounter: Payer: Self-pay | Admitting: Physical Therapy

## 2023-10-12 ENCOUNTER — Ambulatory Visit (INDEPENDENT_AMBULATORY_CARE_PROVIDER_SITE_OTHER): Payer: Medicare Other | Admitting: Physical Therapy

## 2023-10-12 DIAGNOSIS — R42 Dizziness and giddiness: Secondary | ICD-10-CM

## 2023-10-12 DIAGNOSIS — Z7409 Other reduced mobility: Secondary | ICD-10-CM | POA: Diagnosis not present

## 2023-10-12 DIAGNOSIS — M6281 Muscle weakness (generalized): Secondary | ICD-10-CM

## 2023-10-12 DIAGNOSIS — R2681 Unsteadiness on feet: Secondary | ICD-10-CM | POA: Diagnosis not present

## 2023-10-12 DIAGNOSIS — R2689 Other abnormalities of gait and mobility: Secondary | ICD-10-CM | POA: Diagnosis not present

## 2023-10-12 NOTE — Therapy (Signed)
 OUTPATIENT PHYSICAL THERAPY PROSTHETIC TREATMENT   Patient Name: Justin [name] MRN: [id number] DOB:05-29-1933, 89 y.o., male Today's Date: 10/12/2023  END OF SESSION:  PT End of Session - 10/12/23 1535     Visit Number 4    Number of Visits 25    Date for PT Re-Evaluation 12/10/23    Authorization Type [employer] Medicare    Authorization Time Period $20 co-pay    Progress Note Due on Visit 10    PT Start Time 1515    PT Stop Time 1553    PT Time Calculation (min) 38 min    Equipment Utilized During Treatment Gait belt    Activity Tolerance Patient tolerated treatment well    Behavior During Therapy WFL for tasks assessed/performed               Past Medical History:  Diagnosis Date   Anxiety    Arthritis    Carotid arterial disease (HCC)    CKD (chronic kidney disease)    Coronary artery disease    Diabetes mellitus without complication (HCC)    Diabetic retinopathy (HCC)    NPDR OU   Diverticulitis    Dyspnea    GERD (gastroesophageal reflux disease)    History of kidney stones 2021   Hypercholesteremia    Hypertension    Hypertensive retinopathy    OU   Myocardial infarction [facility]) 2009   Pneumonia    as a child   Prostate cancer (HCC)    UTI (lower urinary tract infection)    Past Surgical History:  Procedure Laterality Date   ABDOMINAL AORTOGRAM W/LOWER EXTREMITY N/A 06/22/2023   Procedure: ABDOMINAL AORTOGRAM W/LOWER EXTREMITY;  Surgeon: Sheree Penne Bruckner, MD;  Location: [facility] INVASIVE CV LAB;  Service: Cardiovascular;  Laterality: N/A;   ABDOMINAL SURGERY     for diverticulitis; also removed appendix   AMPUTATION Left 06/24/2023   Procedure: LEFT BELOW THE KNEE AMPUTATION;  Surgeon: Harden Jerona GAILS, MD;  Location: [facility] OR;  Service: Orthopedics;  Laterality: Left;   APPENDECTOMY     CARDIAC CATHETERIZATION  2018   CATARACT EXTRACTION     CATARACT EXTRACTION, BILATERAL     CHOLECYSTECTOMY N/A 10/12/2017   Procedure: LAPAROSCOPIC CHOLECYSTECTOMY;   Surgeon: Vernetta Berg, MD;  Location: [facility] OR;  Service: General;  Laterality: N/A;   CORONARY ARTERY BYPASS GRAFT  2009   ERCP N/A 12/21/2018   Procedure: ENDOSCOPIC RETROGRADE CHOLANGIOPANCREATOGRAPHY (ERCP);  Surgeon: Rollin Dover, MD;  Location: [facility] ENDOSCOPY;  Service: Endoscopy;  Laterality: N/A;   ESOPHAGOGASTRODUODENOSCOPY (EGD) WITH PROPOFOL  N/A 12/17/2018   Procedure: ESOPHAGOGASTRODUODENOSCOPY (EGD) WITH PROPOFOL ;  Surgeon: Rollin Dover, MD;  Location: [facility] ENDOSCOPY;  Service: Endoscopy;  Laterality: N/A;   EUS Left 12/17/2018   Procedure: UPPER ENDOSCOPIC ULTRASOUND (EUS) LINEAR;  Surgeon: Rollin Dover, MD;  Location: [facility] ENDOSCOPY;  Service: Endoscopy;  Laterality: Left;   EYE SURGERY     for bleeding in eye   HERNIA REPAIR     LEFT HEART CATH AND CORONARY ANGIOGRAPHY N/A 11/04/2016   Procedure: Left Heart Cath and Coronary Angiography;  Surgeon: Gordy Bergamo, MD;  Location: [facility] INVASIVE CV LAB;  Service: Cardiovascular;  Laterality: N/A;   LOWER EXTREMITY ANGIOGRAPHY N/A 11/04/2016   Procedure: Lower Extremity Angiography;  Surgeon: Gordy Bergamo, MD;  Location: [facility] INVASIVE CV LAB;  Service: Cardiovascular;  Laterality: N/A;   LOWER EXTREMITY ANGIOGRAPHY N/A 01/03/2020   Procedure: LOWER EXTREMITY ANGIOGRAPHY;  Surgeon: Bergamo Gordy, MD;  Location: [facility] INVASIVE CV LAB;  Service: Cardiovascular;  Laterality: N/A;   LOWER EXTREMITY ANGIOGRAPHY Bilateral 01/14/2022   Procedure: Lower Extremity Angiography;  Surgeon: Ladona Heinz, MD;  Location: [facility] INVASIVE CV LAB;  Service: Cardiovascular;  Laterality: Bilateral;   LUMBAR LAMINECTOMY/DECOMPRESSION MICRODISCECTOMY Bilateral 11/09/2020   Procedure: Laminectomy and Foraminotomy - bilateral - Lumbar Four-Five.;  Surgeon: Louis Shove, MD;  Location: [facility] OR;  Service: Neurosurgery;  Laterality: Bilateral;  posterior   PROSTATE SURGERY     REMOVAL OF STONES  12/21/2018   Procedure: REMOVAL OF STONES;  Surgeon: Rollin Dover, MD;  Location: [facility] ENDOSCOPY;   Service: Endoscopy;;   SPHINCTEROTOMY  12/21/2018   Procedure: ANNETT;  Surgeon: Rollin Dover, MD;  Location: [facility] ENDOSCOPY;  Service: Endoscopy;;   Patient Active Problem List   Diagnosis Date Noted   Osteomyelitis of great toe of left foot (HCC) 06/20/2023   Gangrene of toe of left foot (HCC) 06/19/2023   Normocytic anemia 06/19/2023   Eschar of left great toe 05/22/2023   Near syncope 06/15/2021   Dizziness 06/15/2021   Lumbar stenosis with neurogenic claudication 11/09/2020   CKD stage 3 due to type 2 diabetes mellitus (HCC) 12/14/2019   Asymptomatic bilateral carotid artery stenosis 12/14/2019   Abdominal distention    CAD s/p CABG in 2009 12/14/2018   Acute renal failure superimposed on stage 3 chronic kidney disease (HCC) 12/14/2018   UTI (urinary tract infection) 12/14/2018   HLD (hyperlipidemia) 12/14/2018   Sepsis (HCC) 12/14/2018   Abnormal LFTs    S/P CABG (coronary artery bypass graft) 12/06/2018   Asymptomatic stenosis of right carotid artery 12/06/2018   Claudication in peripheral vascular disease (HCC) 12/06/2018   Orthostatic hypotension due to diabetic dysautonomia 12/06/2018   Elevated liver enzymes 12/06/2018   Abdominal pain 10/12/2018   Abnormal liver function tests 10/12/2018   Foreign body in stomach 10/12/2018   Primary hypertension    GERD (gastroesophageal reflux disease)    Diabetes mellitus without complication (HCC)    Atherosclerosis of native coronary artery of native heart without angina pectoris     PCP: Caleen Dirks, MD   REFERRING PROVIDER: Harden Jerona GAILS, MD  ONSET DATE: 09/14/2023 Prosthesis delivery  REFERRING DIAG: S10.487 (ICD-10-CM) - Left below-knee amputee   THERAPY DIAG:  Other abnormalities of gait and mobility  Unsteadiness on feet  Muscle weakness (generalized)  Impaired functional mobility, balance, gait, and endurance  Dizziness and giddiness  Rationale for Evaluation and Treatment:  Rehabilitation  SUBJECTIVE:   SUBJECTIVE STATEMENT: He denies pain or any issues with prosthesis. He says the wound is healing well and wife confirms   Pt accompanied by: family member  PERTINENT HISTORY: Left TTA 06/24/23, DM2, retinopathy, GERD, HLD, HTN, CAD, CABG 2009, PAD, prostate CA, CKD stage 3b, arthritis, Laminectomy & Foraminotomy bilateral L4-5,   PAIN:  Are you having pain? No  PRECAUTIONS: Fall  WEIGHT BEARING RESTRICTIONS: No  FALLS: Has patient fallen in last 6 months? Yes. Number of falls 1 no injuries  LIVING ENVIRONMENT: Lives with: lives with their family Lives in: House Home Access: Ramped entrance and single step Home layout: Two level, 1/2 bath on main level, and bedroom Stairs: Yes: Internal: 14 steps; on left going up and can reach both and External: 1+1 steps; none Has following equipment at home: Quad cane small base, Environmental Consultant - 2 wheeled, Wheelchair (manual), shower chair, Shower bench, and bed side commode  OCCUPATION: retired from naval architect  PLOF: Independent used cane & walker   PATIENT GOALS: to use prosthesis  to walk in home & community, family wants to be safe,  go upstairs to shower  OBJECTIVE:   POSTURE: Evaluation on 09/16/2023:  rounded shoulders, forward head, flexed trunk , and weight shift right  LOWER EXTREMITY ROM:  ROM P:passive  A:active Right 09/24/23 Left 09/24/23  Hip flexion    Hip extension Debby -5* Thomas -11*  Hip abduction    Hip adduction    Hip internal rotation    Hip external rotation    Knee flexion    Knee extension  Supine P: -7*  Ankle dorsiflexion    Ankle plantarflexion    Ankle inversion    Ankle eversion     (Blank rows = not tested)  LOWER EXTREMITY MMT:  MMT Left 09/24/23  Hip flexion   Hip extension Sidelying 3-/5  Hip abduction Sidleying 3/5  Hip adduction   Hip internal rotation   Hip external rotation   Knee flexion   Knee extension 3-/5  Ankle dorsiflexion   Ankle  plantarflexion   Ankle inversion   Ankle eversion   At Evaluation all strength testing is grossly seated and functionally standing / gait. (Blank rows = not tested)  TRANSFERS: Evaluation on 09/16/2023:  Sit to stand: SBA 20 w/c with armrest to RW limited use of LLE Stand to sit: SBA RW to 20 w/c with armrest limited use of LLE  FUNCTIONAL TESTs:  09/24/2023:  Patient able to stand up from 20 w/c with armrest with RW for stabilization with supervision.  Patient able to maintain upright with RW support without assistance.  Patient able to balance without upper extremity support with minA for 60 seconds.  Deferred at evaluation on 09/16/2023 as limb too edematous to don prosthesis  GAIT: 09/24/2023:  Patient ambulated 50 feet with RW with CGA.  Initially patient walking with a step to pattern with minimal weight shift onto prosthesis.   Deferred at evaluation on 09/16/2023 as limb too edematous to don prosthesis Gait pattern:  Distance walked:  Assistive device utilized:  Level of assistance:  Comments:   CURRENT PROSTHETIC WEAR ASSESSMENT: Evaluation on 09/16/2023:  Patient is dependent with: skin check, residual limb care, care of non-amputated limb, prosthetic cleaning, ply sock cleaning, correct ply sock adjustment, proper wear schedule/adjustment, and proper weight-bearing schedule/adjustment Donning prosthesis: Max A Doffing prosthesis: Min A Prosthetic wear tolerance: 30-45 min 2 days ago when prosthesis delivered.  Prosthetic weight bearing tolerance: 3 minutes partial weight bearing trying to engage pin suspension.  Pt reports distal lateral limb pain from wear 2 days ago.  Edema: pitting edema  Residual limb condition: scab dry over tibial crest 5cm wide X 15 cm long.  Dry skin, normal color & temperature. Cylindrical shape Prosthetic description: silicon liner with anterior portion 9mm thick, secondary pelite foam liner, total contact socket,     TODAY'S TREATMENT:  DATE:  10/12/2023: Prosthetic Training with left Transtibial Amputation: Patient arrived today amb with RW (no w/c) with family supervision.   . Pt amb 100 feet and 150 feet with RW with supervision.    PT demo & verbal cues on neg curb and ramp.  Pt neg 6.5 curb and 12* incline with RW with CGA.   Leg press DL 31# [id number], then SL 68# X 15 for Rt LE, 25# X 15 for Lt LE Balance of foam feet apart 30 seconds, then progressed to feet together 30 sec X 3, without UE support Balance with feet apart without UE support working on trunk rotations X 10 bilat, head nods X 10 bilat,  alternating step taps on 6 inch step X 10 bilat (one UE support for this) 2 UE support and stepping over 6 inch hurdles, 2 hurdles X 6 reps, cues and explanation for stepping over with prosthesis side first. 6 inch step ups with bilat UE support X 5 leading with Rt and X 5 leading with left  10/07/2023: Prosthetic Training with left Transtibial Amputation: Patient arrived today amb with RW (no w/c) with family supervision.   Patient's wound had loose distal scab that PT removed with tweezers.  His wound on tibial crest is now 6-2mm wide with 2 superficial (no longer thick) proximal scabs.  No signs of infection.  PT recommended continuing with Vivewear under liner sock against wound. Increase wear to 4 hrs on, 2 hours off, 2x/day then 3rd wear until bed time.  Switch and clean liners when off the second time.  Pt & granddaughter verbalized understanding.   PT instructed in adjusting ply socks with HO, verbal and demo cues.  Don & ambulate with too few, too many and correct ply fit.  Instructed that ply socks depend on his limb volume.  Over time he will start with more socks in morning which is slow process and during day he may need to add socks as weight bearing can pump fluid out of limb.  Pt and granddaughter  verbalized understanding.   Pt amb 25' X 3 and 60' X 2 with RW with supervision.  PT demo & verbal cues on sit /stand chairs without armrests. Pt return demo with supervision.   PT demo & verbal cues on neg curb and ramp.  Pt neg 6.5 curb and 12* incline with RW with CGA.      TREATMENT:                                                                                                                             DATE:  09/24/2023: Prosthetic Training with left Transtibial Amputation: Patient's family forgot liner.  While his daughter went home to get the liner PT assessed range of motion and manual muscle test noted above. PT also educated patient and granddaughter on making a list for traveling for all things associated with the prosthesis including soaps that he cleans the liner.  PT  recommended checking list prior to departure for trip and prior to her departure to return home.  Patient and granddaughter verbalized understanding PT demo and verbal cues on urinating standing up with RW support using 1 hand at a time to pull pants up and down.  Also washing hands with anterior stomach or pelvis in contact with the sink.  PT also verbal cues on how to don long pants with a transtibial prosthesis.  Patient, daughter and granddaughter verbalized understanding. Patient able to stand up from 20 w/c with armrest with RW for stabilization with supervision.  Patient able to maintain upright with RW support without assistance.  Patient able to balance without upper extremity support with minA for 60 seconds. Patient ambulated 50 feet with RW with CGA.  Initially patient walking with a step to pattern with minimal weight shift onto prosthesis.  PT demo and verbal cues on step through pattern.  Patient able to return demonstration with RW support. PT demo and verbal cues on negotiating stairs per family request due to an outing this weekend.  Patient negotiated 3 steps forward facing with 2 rails step to pattern  with contact-guard assistance.  Patient also negotiated 3 steps side facing bilateral upper extremity support on left ascending rail with contact-guard assistance.  Patient, daughter and granddaughter verbalized understanding of technique. PT demo and verbal cues on a car transfer using RW.  Patient able to safely perform car transfer with RW with supervision safely. PT recommended continuing wear of both liner and prosthesis now for 2 hours 3 times per day.  His next appointment is greater than 1 week out.  PT recommended after 1 week if the wound on his anterior tibia does not look irritated or increased in size, then increase wear to 3 hours 3 times per day with the prosthesis off 2 hours at a time between the wears.  Patient, daughter and granddaughter verbalized understanding    TREATMENT:                                                                                                                             DATE:  09/16/2023:  PATIENT EDUCATION: PATIENT EDUCATED ON FOLLOWING PROSTHETIC CARE: Education details:   Skin check, Residual limb care, Prosthetic cleaning, Correct ply sock adjustment, Propper donning, and Proper wear schedule/adjustment Prosthetic wear tolerance: liner only initially 2 hours 3x/day, 7 days/week Person educated: Patient and Child(ren) Education method: Explanation, Demonstration, Tactile cues, and Verbal cues Education comprehension: dtr & granddaughter verbalized understanding, verbal cues required, tactile cues required, and needs further education  HOME EXERCISE PROGRAM:  ASSESSMENT:  CLINICAL IMPRESSION: I did provide his RW with tennis balls today which helped decrease some friction and improve gait speed. Continued to work on prosthetic gait, balance, and leg strength today as tolerated. We will monitor for any soreness and continue with current PT plan.  OBJECTIVE IMPAIRMENTS: Abnormal gait, decreased activity tolerance, decreased balance, decreased  endurance, decreased knowledge of condition,  decreased knowledge of use of DME, decreased mobility, difficulty walking, decreased ROM, decreased strength, increased edema, prosthetic dependency , and pain.   ACTIVITY LIMITATIONS: carrying, lifting, sitting, standing, stairs, transfers, and locomotion level  PARTICIPATION LIMITATIONS: standing ADLS, cleaning, community activity, and household activity  PERSONAL FACTORS: Age, Fitness, Past/current experiences, Time since onset of injury/illness/exacerbation, and 3+ comorbidities: see PMH  are also affecting patient's functional outcome.   REHAB POTENTIAL: Good  CLINICAL DECISION MAKING: Evolving/moderate complexity  EVALUATION COMPLEXITY: Moderate   GOALS: Goals reviewed with patient? Yes  SHORT TERM GOALS: Target date: 10/15/2023  Patient donnes prosthesis modified independent & family verbalizes proper cleaning. Baseline: SEE OBJECTIVE DATA Goal status: MET 10/07/2023 2.  Patient tolerates prosthesis >8 hrs total /day without skin issues increasing or limb pain >4/10 after standing. Baseline: SEE OBJECTIVE DATA Goal status: MET 10/07/2023  3.  Patient able to reach 7 and look over both shoulders with RW support with supervision. Baseline: SEE OBJECTIVE DATA Goal status: Ongoing 10/07/2023  4. Patient ambulates 35' with RW & prosthesis with supervision. Baseline: SEE OBJECTIVE DATA Goal status: MET 10/07/2023  5. Patient negotiates ramps & curbs with RW & prosthesis with minA. Baseline: SEE OBJECTIVE DATA Goal status: Ongoing 10/07/2023  LONG TERM GOALS: Target date: 12/10/2023  Patient & family demonstrate & verbalize understanding of prosthetic care to enable safe utilization of prosthesis. Baseline: SEE OBJECTIVE DATA Goal status: Ongoing 10/07/2023  Patient tolerates prosthesis wear >90% of awake hours without skin or limb pain issues. Baseline: SEE OBJECTIVE DATA Goal status: Ongoing 10/07/2023  Task of [employer] with RW  support >/= 30/56 to indicate lower fall risk with standing ADLs. Baseline: SEE OBJECTIVE DATA Goal status: Ongoing 10/07/2023  Patient ambulates 56' modified independent &  >200' family supervision with prosthesis & RW. Baseline: SEE OBJECTIVE DATA Goal status: Ongoing 10/07/2023  Patient negotiates ramps, curbs & stairs with single rail with prosthesis & RW with family assistance safely.  Baseline: SEE OBJECTIVE DATA Goal status: Ongoing 10/07/2023   PLAN:  PT FREQUENCY: 2x/week  PT DURATION: 12 weeks  PLANNED INTERVENTIONS: 97164- PT Re-evaluation, 97110-Therapeutic exercises, 97530- Therapeutic activity, 97112- Neuromuscular re-education, 97535- Self Care, [id number]- Gait training, [phone]- Prosthetic training, Patient/Family education, Balance training, Stair training, DME instructions, and physical performance testing  PLAN FOR NEXT SESSION: Check wound on residual limb and progress wear as indicated, balance with HEP at sink.  Prosthetic gait with RW.   Redell JONELLE Moose, PT, DPT 10/12/2023, 3:37 PM  Date of referral: 08/31/2023 Referring provider: Jerona Sage, MD Referring diagnosis? S10.487 (ICD-10-CM) - Left below-knee amputee  Treatment diagnosis? (if different than referring diagnosis)  Other abnormalities of gait and mobility  ICD-10-CM: R26.89   Unsteadiness on feet  ICD-10-CM: R26.81   Muscle weakness (generalized)  ICD-10-CM: M62.81   Impaired functional mobility, balance, gait, and endurance  ICD-10-CM: Z74.09   What was this (referring dx) caused by? Surgery (Type: left BKA)  Nature of Condition: Initial Onset (within last 3 months) prosthesis delivery    Laterality: Lt  Current Functional Measure Score: Other standing balance supervision static stance 1 min.   Objective measurements identify impairments when they are compared to normal values, the uninvolved extremity, and prior level of function.  [x]  Yes  []  No  Objective assessment of functional ability:  Moderate functional limitations   Briefly describe symptoms: patient is dependent in prosthetic care & use.  He has impaired mobility. Patient is deconditioned from prolonged limited activity with wound, then amputation.  How did symptoms start: infected wound led to amputation  Average pain intensity:  Last 24 hours: 0  Past week: 0  How often does the pt experience symptoms? Constantly  How much have the symptoms interfered with usual daily activities? Extremely  How has condition changed since care began at this facility? NA - initial visit  In general, how is the patients overall health? Good   BACK PAIN (STarT Back Screening Tool) No

## 2023-10-13 ENCOUNTER — Other Ambulatory Visit: Payer: Self-pay | Admitting: Cardiology

## 2023-10-13 DIAGNOSIS — N401 Enlarged prostate with lower urinary tract symptoms: Secondary | ICD-10-CM

## 2023-10-14 ENCOUNTER — Ambulatory Visit (INDEPENDENT_AMBULATORY_CARE_PROVIDER_SITE_OTHER): Payer: Medicare Other | Admitting: Physical Therapy

## 2023-10-14 ENCOUNTER — Encounter: Payer: Self-pay | Admitting: Physical Therapy

## 2023-10-14 DIAGNOSIS — Z7409 Other reduced mobility: Secondary | ICD-10-CM | POA: Diagnosis not present

## 2023-10-14 DIAGNOSIS — R2681 Unsteadiness on feet: Secondary | ICD-10-CM | POA: Diagnosis not present

## 2023-10-14 DIAGNOSIS — R2689 Other abnormalities of gait and mobility: Secondary | ICD-10-CM | POA: Diagnosis not present

## 2023-10-14 DIAGNOSIS — M6281 Muscle weakness (generalized): Secondary | ICD-10-CM

## 2023-10-14 NOTE — Therapy (Signed)
 OUTPATIENT PHYSICAL THERAPY PROSTHETIC TREATMENT   Patient Name: Justin [name] MRN: [id number] DOB:01/12/1933, 89 y.o., male Today's Date: 10/14/2023  END OF SESSION:  PT End of Session - 10/14/23 1509     Visit Number 5    Number of Visits 25    Date for PT Re-Evaluation 12/10/23    Authorization Type [employer] Medicare    Authorization Time Period $20 co-pay    Progress Note Due on Visit 10    PT Start Time 1509    PT Stop Time 1558    PT Time Calculation (min) 49 min    Equipment Utilized During Treatment Gait belt    Activity Tolerance Patient tolerated treatment well    Behavior During Therapy WFL for tasks assessed/performed                Past Medical History:  Diagnosis Date   Anxiety    Arthritis    Carotid arterial disease (HCC)    CKD (chronic kidney disease)    Coronary artery disease    Diabetes mellitus without complication (HCC)    Diabetic retinopathy (HCC)    NPDR OU   Diverticulitis    Dyspnea    GERD (gastroesophageal reflux disease)    History of kidney stones 2021   Hypercholesteremia    Hypertension    Hypertensive retinopathy    OU   Myocardial infarction [facility]) 2009   Pneumonia    as a child   Prostate cancer (HCC)    UTI (lower urinary tract infection)    Past Surgical History:  Procedure Laterality Date   ABDOMINAL AORTOGRAM W/LOWER EXTREMITY N/A 06/22/2023   Procedure: ABDOMINAL AORTOGRAM W/LOWER EXTREMITY;  Surgeon: Adine Hoof, MD;  Location: [facility] INVASIVE CV LAB;  Service: Cardiovascular;  Laterality: N/A;   ABDOMINAL SURGERY     for diverticulitis; also removed appendix   AMPUTATION Left 06/24/2023   Procedure: LEFT BELOW THE KNEE AMPUTATION;  Surgeon: Timothy Ford, MD;  Location: [facility] OR;  Service: Orthopedics;  Laterality: Left;   APPENDECTOMY     CARDIAC CATHETERIZATION  2018   CATARACT EXTRACTION     CATARACT EXTRACTION, BILATERAL     CHOLECYSTECTOMY N/A 10/12/2017   Procedure: LAPAROSCOPIC CHOLECYSTECTOMY;   Surgeon: Oza Blumenthal, MD;  Location: [facility] OR;  Service: General;  Laterality: N/A;   CORONARY ARTERY BYPASS GRAFT  2009   ERCP N/A 12/21/2018   Procedure: ENDOSCOPIC RETROGRADE CHOLANGIOPANCREATOGRAPHY (ERCP);  Surgeon: Alvis Jourdain, MD;  Location: [facility] ENDOSCOPY;  Service: Endoscopy;  Laterality: N/A;   ESOPHAGOGASTRODUODENOSCOPY (EGD) WITH PROPOFOL  N/A 12/17/2018   Procedure: ESOPHAGOGASTRODUODENOSCOPY (EGD) WITH PROPOFOL ;  Surgeon: Alvis Jourdain, MD;  Location: [facility] ENDOSCOPY;  Service: Endoscopy;  Laterality: N/A;   EUS Left 12/17/2018   Procedure: UPPER ENDOSCOPIC ULTRASOUND (EUS) LINEAR;  Surgeon: Alvis Jourdain, MD;  Location: [facility] ENDOSCOPY;  Service: Endoscopy;  Laterality: Left;   EYE SURGERY     "for bleeding in eye"   HERNIA REPAIR     LEFT HEART CATH AND CORONARY ANGIOGRAPHY N/A 11/04/2016   Procedure: Left Heart Cath and Coronary Angiography;  Surgeon: Knox Perl, MD;  Location: [facility] INVASIVE CV LAB;  Service: Cardiovascular;  Laterality: N/A;   LOWER EXTREMITY ANGIOGRAPHY N/A 11/04/2016   Procedure: Lower Extremity Angiography;  Surgeon: Knox Perl, MD;  Location: [facility] INVASIVE CV LAB;  Service: Cardiovascular;  Laterality: N/A;   LOWER EXTREMITY ANGIOGRAPHY N/A 01/03/2020   Procedure: LOWER EXTREMITY ANGIOGRAPHY;  Surgeon: Knox Perl, MD;  Location: [facility] INVASIVE CV LAB;  Service: Cardiovascular;  Laterality: N/A;   LOWER EXTREMITY ANGIOGRAPHY Bilateral 01/14/2022   Procedure: Lower Extremity Angiography;  Surgeon: Knox Perl, MD;  Location: [facility] INVASIVE CV LAB;  Service: Cardiovascular;  Laterality: Bilateral;   LUMBAR LAMINECTOMY/DECOMPRESSION MICRODISCECTOMY Bilateral 11/09/2020   Procedure: Laminectomy and Foraminotomy - bilateral - Lumbar Four-Five.;  Surgeon: Agustina Aldrich, MD;  Location: [facility] OR;  Service: Neurosurgery;  Laterality: Bilateral;  posterior   PROSTATE SURGERY     REMOVAL OF STONES  12/21/2018   Procedure: REMOVAL OF STONES;  Surgeon: Alvis Jourdain, MD;  Location: [facility] ENDOSCOPY;   Service: Endoscopy;;   SPHINCTEROTOMY  12/21/2018   Procedure: [address];  Surgeon: Alvis Jourdain, MD;  Location: [facility] ENDOSCOPY;  Service: Endoscopy;;   Patient Active Problem List   Diagnosis Date Noted   Osteomyelitis of great toe of left foot (HCC) 06/20/2023   Gangrene of toe of left foot (HCC) 06/19/2023   Normocytic anemia 06/19/2023   Eschar of left great toe 05/22/2023   Near syncope 06/15/2021   Dizziness 06/15/2021   Lumbar stenosis with neurogenic claudication 11/09/2020   CKD stage 3 due to type 2 diabetes mellitus (HCC) 12/14/2019   Asymptomatic bilateral carotid artery stenosis 12/14/2019   Abdominal distention    CAD s/p CABG in 2009 12/14/2018   Acute renal failure superimposed on stage 3 chronic kidney disease (HCC) 12/14/2018   UTI (urinary tract infection) 12/14/2018   HLD (hyperlipidemia) 12/14/2018   Sepsis (HCC) 12/14/2018   Abnormal LFTs    S/P CABG (coronary artery bypass graft) 12/06/2018   Asymptomatic stenosis of right carotid artery 12/06/2018   Claudication in peripheral vascular disease (HCC) 12/06/2018   Orthostatic hypotension due to diabetic dysautonomia 12/06/2018   Elevated liver enzymes 12/06/2018   Abdominal pain 10/12/2018   Abnormal liver function tests 10/12/2018   Foreign body in stomach 10/12/2018   Primary hypertension    GERD (gastroesophageal reflux disease)    Diabetes mellitus without complication (HCC)    Atherosclerosis of native coronary artery of native heart without angina pectoris     PCP: Tita Form, MD   REFERRING PROVIDER: Timothy Ford, MD  ONSET DATE: 09/14/2023 Prosthesis delivery  REFERRING DIAG: V40.981 (ICD-10-CM) - Left below-knee amputee   THERAPY DIAG:  Other abnormalities of gait and mobility  Unsteadiness on feet  Muscle weakness (generalized)  Impaired functional mobility, balance, gait, and endurance  Rationale for Evaluation and Treatment: Rehabilitation  SUBJECTIVE:   SUBJECTIVE  STATEMENT: He is wearing prosthesis 4 hours on, 2hrs off, 3rd time until bedtime with no issues.  He does not use w/c when wearing prosthesis.  Pt accompanied by: family member  PERTINENT HISTORY: Left TTA 06/24/23, DM2, retinopathy, GERD, HLD, HTN, CAD, CABG 2009, PAD, prostate CA, CKD stage 3b, arthritis, Laminectomy & Foraminotomy bilateral L4-5,   PAIN:  Are you having pain? No  PRECAUTIONS: Fall  WEIGHT BEARING RESTRICTIONS: No  FALLS: Has patient fallen in last 6 months? Yes. Number of falls 1 no injuries  LIVING ENVIRONMENT: Lives with: lives with their family Lives in: House Home Access: Ramped entrance and single step Home layout: Two level, 1/2 bath on main level, and bedroom Stairs: Yes: Internal: 14 steps; on left going up and can reach both and External: 1+1 steps; none Has following equipment at home: Quad cane small base, Environmental consultant - 2 wheeled, Wheelchair (manual), shower chair, Shower bench, and bed side commode  OCCUPATION: retired from [employer]  PLOF: Independent used cane & walker   PATIENT GOALS: to  use prosthesis to walk in home & community, family wants to be safe,  go upstairs to shower  OBJECTIVE:   POSTURE: Evaluation on 09/16/2023:  rounded shoulders, forward head, flexed trunk , and weight shift right  LOWER EXTREMITY ROM:  ROM P:passive  A:active Right 09/24/23 Left 09/24/23  Hip flexion    Hip extension Andy Bannister -5* Thomas -11*  Hip abduction    Hip adduction    Hip internal rotation    Hip external rotation    Knee flexion    Knee extension  Supine P: -7*  Ankle dorsiflexion    Ankle plantarflexion    Ankle inversion    Ankle eversion     (Blank rows = not tested)  LOWER EXTREMITY MMT:  MMT Left 09/24/23  Hip flexion   Hip extension Sidelying 3-/5  Hip abduction Sidleying 3/5  Hip adduction   Hip internal rotation   Hip external rotation   Knee flexion   Knee extension 3-/5  Ankle dorsiflexion   Ankle plantarflexion    Ankle inversion   Ankle eversion   At Evaluation all strength testing is grossly seated and functionally standing / gait. (Blank rows = not tested)  TRANSFERS: Evaluation on 09/16/2023:  Sit to stand: SBA 20" w/c with armrest to RW limited use of LLE Stand to sit: SBA RW to 20" w/c with armrest limited use of LLE  FUNCTIONAL TESTs:  09/24/2023:  Patient able to stand up from 20" w/c with armrest with RW for stabilization with supervision.  Patient able to maintain upright with RW support without assistance.  Patient able to balance without upper extremity support with minA for 60 seconds.  Deferred at evaluation on 09/16/2023 as limb too edematous to don prosthesis  GAIT: 09/24/2023:  Patient ambulated 50 feet with RW with CGA.  Initially patient walking with a step to pattern with minimal weight shift onto prosthesis.   Deferred at evaluation on 09/16/2023 as limb too edematous to don prosthesis Gait pattern:  Distance walked:  Assistive device utilized:  Level of assistance:  Comments:   CURRENT PROSTHETIC WEAR ASSESSMENT: Evaluation on 09/16/2023:  Patient is dependent with: skin check, residual limb care, care of non-amputated limb, prosthetic cleaning, ply sock cleaning, correct ply sock adjustment, proper wear schedule/adjustment, and proper weight-bearing schedule/adjustment Donning prosthesis: Max A Doffing prosthesis: Min A Prosthetic wear tolerance: 30-45 min 2 days ago when prosthesis delivered.  Prosthetic weight bearing tolerance: 3 minutes partial weight bearing trying to engage pin suspension.  Pt reports distal lateral limb pain from wear 2 days ago.  Edema: pitting edema  Residual limb condition: scab dry over tibial crest 5cm wide X 15 cm long.  Dry skin, normal color & temperature. Cylindrical shape Prosthetic description: silicon liner with anterior portion 9mm thick, secondary pelite foam liner, total contact socket,     TODAY'S TREATMENT:  DATE:  10/14/2023: Prosthetic Training with left Transtibial Amputation: Patient's wound on tibial crest continues to have scab.  PT used tweezers to remove dry edges.  PT demo & verbal cues on scar mobilization approximating tissue ant/post and med/lat. Pt and dtr verbalized understanding.   PT demo & verbal cues on neg stairs with single rail & SBQC.  Pt neg 11 steps switching side of rail half way with SBQC in other hand from rail with CGA / verbal cues.   Pt amb 20' X 3 with cane (trail SBQC, cane stand alone tip & cane std tip) all with CGA.  Pt was steadier with cane stand alone tip but has potential for std tip.   Pt amb 130' with cane stand alone tip with 1 LOB initially with minA to stabilize, then close supervision with 2 small LOB that he self-corrected. PT recommended increasing wear to 5 hours 2x/day.  Pt verbalized understanding.    TREATMENT:                                                                                                                             DATE:  10/12/2023: Prosthetic Training with left Transtibial Amputation: Patient arrived today amb with RW (no w/c) with family supervision.   . Pt amb 100 feet and 150 feet with RW with supervision.    PT demo & verbal cues on neg curb and ramp.  Pt neg 6.5" curb and 12* incline with RW with CGA.   Leg press DL 40# 9W11, then SL 91# X 15 for Rt LE, 25# X 15 for Lt LE Balance of foam feet apart 30 seconds, then progressed to feet together 30 sec X 3, without UE support Balance with feet apart without UE support working on trunk rotations X 10 bilat, head nods X 10 bilat,  alternating step taps on 6 inch step X 10 bilat (one UE support for this) 2 UE support and stepping over 6 inch hurdles, 2 hurdles X 6 reps, cues and explanation for stepping over with prosthesis side first. 6 inch step ups with bilat UE support X 5  leading with Rt and X 5 leading with left  10/07/2023: Prosthetic Training with left Transtibial Amputation: Patient arrived today amb with RW (no w/c) with family supervision.   Patient's wound had loose distal scab that PT removed with tweezers.  His wound on tibial crest is now 6-55mm wide with 2 superficial (no longer thick) proximal scabs.  No signs of infection.  PT recommended continuing with Vivewear under liner sock against wound. Increase wear to 4 hrs on, 2 hours off, 2x/day then 3rd wear until bed time.  Switch and clean liners when off the second time.  Pt & granddaughter verbalized understanding.   PT instructed in adjusting ply socks with HO, verbal and demo cues.  Don & ambulate with too few, too many and correct ply fit.  Instructed that ply socks depend on his limb  volume.  Over time he will start with more socks in morning which is slow process and during day he may need to add socks as weight bearing can pump fluid out of limb.  Pt and granddaughter verbalized understanding.   Pt amb 25' X 3 and 33' X 2 with RW with supervision.  PT demo & verbal cues on sit /stand chairs without armrests. Pt return demo with supervision.   PT demo & verbal cues on neg curb and ramp.  Pt neg 6.5" curb and 12* incline with RW with CGA.      TREATMENT:                                                                                                                             DATE:  09/24/2023: Prosthetic Training with left Transtibial Amputation: Patient's family forgot liner.  While his daughter went home to get the liner PT assessed range of motion and manual muscle test noted above. PT also educated patient and granddaughter on making a list for traveling for all things associated with the prosthesis including soaps that he cleans the liner.  PT recommended checking list prior to departure for trip and prior to her departure to return home.  Patient and granddaughter verbalized understanding PT  demo and verbal cues on urinating standing up with RW support using 1 hand at a time to pull pants up and down.  Also washing hands with anterior stomach or pelvis in contact with the sink.  PT also verbal cues on how to don long pants with a transtibial prosthesis.  Patient, daughter and granddaughter verbalized understanding. Patient able to stand up from 20" w/c with armrest with RW for stabilization with supervision.  Patient able to maintain upright with RW support without assistance.  Patient able to balance without upper extremity support with minA for 60 seconds. Patient ambulated 50 feet with RW with CGA.  Initially patient walking with a step to pattern with minimal weight shift onto prosthesis.  PT demo and verbal cues on step through pattern.  Patient able to return demonstration with RW support. PT demo and verbal cues on negotiating stairs per family request due to an outing this weekend.  Patient negotiated 3 steps forward facing with 2 rails step to pattern with contact-guard assistance.  Patient also negotiated 3 steps side facing bilateral upper extremity support on left ascending rail with contact-guard assistance.  Patient, daughter and granddaughter verbalized understanding of technique. PT demo and verbal cues on a car transfer using RW.  Patient able to safely perform car transfer with RW with supervision safely. PT recommended continuing wear of both liner and prosthesis now for 2 hours 3 times per day.  His next appointment is greater than 1 week out.  PT recommended after 1 week if the wound on his anterior tibia does not look irritated or increased in size, then increase wear to 3 hours 3 times per day with the prosthesis  off 2 hours at a time between the wears.  Patient, daughter and granddaughter verbalized understanding    TREATMENT:                                                                                                                             DATE:   09/16/2023:  PATIENT EDUCATION: PATIENT EDUCATED ON FOLLOWING PROSTHETIC CARE: Education details:   Skin check, Residual limb care, Prosthetic cleaning, Correct ply sock adjustment, Propper donning, and Proper wear schedule/adjustment Prosthetic wear tolerance: liner only initially 2 hours 3x/day, 7 days/week Person educated: Patient and Child(ren) Education method: Explanation, Demonstration, Tactile cues, and Verbal cues Education comprehension: dtr & granddaughter verbalized understanding, verbal cues required, tactile cues required, and needs further education  HOME EXERCISE PROGRAM:  ASSESSMENT:  CLINICAL IMPRESSION: Pt appears safe to neg stairs at his home with family supervision.  PT progressed to gait with cane today which he improved but needs more work for safety and independence.    OBJECTIVE IMPAIRMENTS: Abnormal gait, decreased activity tolerance, decreased balance, decreased endurance, decreased knowledge of condition, decreased knowledge of use of DME, decreased mobility, difficulty walking, decreased ROM, decreased strength, increased edema, prosthetic dependency , and pain.   ACTIVITY LIMITATIONS: carrying, lifting, sitting, standing, stairs, transfers, and locomotion level  PARTICIPATION LIMITATIONS: standing ADLS, cleaning, community activity, and household activity  PERSONAL FACTORS: Age, Fitness, Past/current experiences, Time since onset of injury/illness/exacerbation, and 3+ comorbidities: see PMH  are also affecting patient's functional outcome.   REHAB POTENTIAL: Good  CLINICAL DECISION MAKING: Evolving/moderate complexity  EVALUATION COMPLEXITY: Moderate   GOALS: Goals reviewed with patient? Yes  SHORT TERM GOALS: Target date: 10/15/2023  Patient donnes prosthesis modified independent & family verbalizes proper cleaning. Baseline: SEE OBJECTIVE DATA Goal status: MET 10/07/2023 2.  Patient tolerates prosthesis >8 hrs total /day without skin issues  increasing or limb pain >4/10 after standing. Baseline: SEE OBJECTIVE DATA Goal status: MET 10/07/2023  3.  Patient able to reach 7" and look over both shoulders with RW support with supervision. Baseline: SEE OBJECTIVE DATA Goal status: MET 10/14/2023  4. Patient ambulates 18' with RW & prosthesis with supervision. Baseline: SEE OBJECTIVE DATA Goal status: MET 10/07/2023  5. Patient negotiates ramps & curbs with RW & prosthesis with minA. Baseline: SEE OBJECTIVE DATA Goal status: MET 10/07/2023  LONG TERM GOALS: Target date: 12/10/2023  Patient & family demonstrate & verbalize understanding of prosthetic care to enable safe utilization of prosthesis. Baseline: SEE OBJECTIVE DATA Goal status: Ongoing 10/07/2023  Patient tolerates prosthesis wear >90% of awake hours without skin or limb pain issues. Baseline: SEE OBJECTIVE DATA Goal status: Ongoing 10/07/2023  Task of [employer] with RW support >/= 30/56 to indicate lower fall risk with standing ADLs. Baseline: SEE OBJECTIVE DATA Goal status: Ongoing 10/07/2023  Patient ambulates 65' modified independent &  >200' family supervision with prosthesis & RW. Baseline: SEE OBJECTIVE DATA Goal status: Ongoing 10/07/2023  Patient negotiates ramps, curbs &  stairs with single rail with prosthesis & RW with family assistance safely.  Baseline: SEE OBJECTIVE DATA Goal status: Ongoing 10/07/2023   PLAN:  PT FREQUENCY: 2x/week  PT DURATION: 12 weeks  PLANNED INTERVENTIONS: 97164- PT Re-evaluation, 97110-Therapeutic exercises, 97530- Therapeutic activity, 97112- Neuromuscular re-education, 97535- Self Care, 16109- Gait training, [phone]- Prosthetic training, Patient/Family education, Balance training, Stair training, DME instructions, and physical performance testing  PLAN FOR NEXT SESSION:  Check wound on residual limb and progress wear as indicated,  Prosthetic gait with cane.   Lorie Rook, PT, DPT 10/14/2023, 4:16 PM  Date of referral:  08/31/2023 Referring provider: Gearldean Keepers, MD Referring diagnosis? U98.119 (ICD-10-CM) - Left below-knee amputee  Treatment diagnosis? (if different than referring diagnosis)  Other abnormalities of gait and mobility  ICD-10-CM: R26.89   Unsteadiness on feet  ICD-10-CM: R26.81   Muscle weakness (generalized)  ICD-10-CM: M62.81   Impaired functional mobility, balance, gait, and endurance  ICD-10-CM: Z74.09   What was this (referring dx) caused by? Surgery (Type: left BKA)  Nature of Condition: Initial Onset (within last 3 months) prosthesis delivery    Laterality: Lt  Current Functional Measure Score: Other standing balance supervision static stance 1 min.   Objective measurements identify impairments when they are compared to normal values, the uninvolved extremity, and prior level of function.  [x]  Yes  []  No  Objective assessment of functional ability: Moderate functional limitations   Briefly describe symptoms: patient is dependent in prosthetic care & use.  He has impaired mobility. Patient is deconditioned from prolonged limited activity with wound, then amputation.   How did symptoms start: infected wound led to amputation  Average pain intensity:  Last 24 hours: 0  Past week: 0  How often does the pt experience symptoms? Constantly  How much have the symptoms interfered with usual daily activities? Extremely  How has condition changed since care began at this facility? NA - initial visit  In general, how is the patients overall health? Good   BACK PAIN (STarT Back Screening Tool) No

## 2023-10-20 ENCOUNTER — Encounter: Payer: Medicare Other | Admitting: Physical Therapy

## 2023-10-21 ENCOUNTER — Encounter: Payer: Medicare Other | Admitting: Physical Therapy

## 2023-10-26 ENCOUNTER — Encounter: Payer: Self-pay | Admitting: Physical Therapy

## 2023-10-26 ENCOUNTER — Ambulatory Visit: Payer: Medicare Other | Admitting: Physical Therapy

## 2023-10-26 DIAGNOSIS — R2681 Unsteadiness on feet: Secondary | ICD-10-CM

## 2023-10-26 DIAGNOSIS — M6281 Muscle weakness (generalized): Secondary | ICD-10-CM

## 2023-10-26 DIAGNOSIS — R2689 Other abnormalities of gait and mobility: Secondary | ICD-10-CM | POA: Diagnosis not present

## 2023-10-26 DIAGNOSIS — Z7409 Other reduced mobility: Secondary | ICD-10-CM

## 2023-10-26 NOTE — Therapy (Signed)
OUTPATIENT PHYSICAL THERAPY PROSTHETIC TREATMENT   Patient Name: Justin [name] MRN: [id number] DOB:November 25, 1932, 89 y.o., male Today's Date: 10/26/2023  END OF SESSION:  PT End of Session - 10/26/23 1612     Visit Number 6    Number of Visits 25    Date for PT Re-Evaluation 12/10/23    Authorization Type [employer] Medicare    Authorization Time Period $20 co-pay    Progress Note Due on Visit 10    PT Start Time 1607    PT Stop Time 1645    PT Time Calculation (min) 38 min    Equipment Utilized During Treatment Gait belt    Activity Tolerance Patient tolerated treatment well    Behavior During Therapy WFL for tasks assessed/performed                Past Medical History:  Diagnosis Date   Anxiety    Arthritis    Carotid arterial disease (HCC)    CKD (chronic kidney disease)    Coronary artery disease    Diabetes mellitus without complication (HCC)    Diabetic retinopathy (HCC)    NPDR OU   Diverticulitis    Dyspnea    GERD (gastroesophageal reflux disease)    History of kidney stones 2021   Hypercholesteremia    Hypertension    Hypertensive retinopathy    OU   Myocardial infarction [facility]) 2009   Pneumonia    as a child   Prostate cancer (HCC)    UTI (lower urinary tract infection)    Past Surgical History:  Procedure Laterality Date   ABDOMINAL AORTOGRAM W/LOWER EXTREMITY N/A 06/22/2023   Procedure: ABDOMINAL AORTOGRAM W/LOWER EXTREMITY;  Surgeon: Maeola Harman, MD;  Location: [facility] INVASIVE CV LAB;  Service: Cardiovascular;  Laterality: N/A;   ABDOMINAL SURGERY     for diverticulitis; also removed appendix   AMPUTATION Left 06/24/2023   Procedure: LEFT BELOW THE KNEE AMPUTATION;  Surgeon: Nadara Mustard, MD;  Location: [facility] OR;  Service: Orthopedics;  Laterality: Left;   APPENDECTOMY     CARDIAC CATHETERIZATION  2018   CATARACT EXTRACTION     CATARACT EXTRACTION, BILATERAL     CHOLECYSTECTOMY N/A 10/12/2017   Procedure: LAPAROSCOPIC CHOLECYSTECTOMY;   Surgeon: Abigail Miyamoto, MD;  Location: [facility] OR;  Service: General;  Laterality: N/A;   CORONARY ARTERY BYPASS GRAFT  2009   ERCP N/A 12/21/2018   Procedure: ENDOSCOPIC RETROGRADE CHOLANGIOPANCREATOGRAPHY (ERCP);  Surgeon: Jeani Hawking, MD;  Location: [facility] ENDOSCOPY;  Service: Endoscopy;  Laterality: N/A;   ESOPHAGOGASTRODUODENOSCOPY (EGD) WITH PROPOFOL N/A 12/17/2018   Procedure: ESOPHAGOGASTRODUODENOSCOPY (EGD) WITH PROPOFOL;  Surgeon: Jeani Hawking, MD;  Location: [facility] ENDOSCOPY;  Service: Endoscopy;  Laterality: N/A;   EUS Left 12/17/2018   Procedure: UPPER ENDOSCOPIC ULTRASOUND (EUS) LINEAR;  Surgeon: Jeani Hawking, MD;  Location: [facility] ENDOSCOPY;  Service: Endoscopy;  Laterality: Left;   EYE SURGERY     "for bleeding in eye"   HERNIA REPAIR     LEFT HEART CATH AND CORONARY ANGIOGRAPHY N/A 11/04/2016   Procedure: Left Heart Cath and Coronary Angiography;  Surgeon: Yates Decamp, MD;  Location: [facility] INVASIVE CV LAB;  Service: Cardiovascular;  Laterality: N/A;   LOWER EXTREMITY ANGIOGRAPHY N/A 11/04/2016   Procedure: Lower Extremity Angiography;  Surgeon: Yates Decamp, MD;  Location: [facility] INVASIVE CV LAB;  Service: Cardiovascular;  Laterality: N/A;   LOWER EXTREMITY ANGIOGRAPHY N/A 01/03/2020   Procedure: LOWER EXTREMITY ANGIOGRAPHY;  Surgeon: Yates Decamp, MD;  Location: [facility] INVASIVE CV LAB;  Service: Cardiovascular;  Laterality: N/A;   LOWER EXTREMITY ANGIOGRAPHY Bilateral 01/14/2022   Procedure: Lower Extremity Angiography;  Surgeon: Yates Decamp, MD;  Location: [facility] INVASIVE CV LAB;  Service: Cardiovascular;  Laterality: Bilateral;   LUMBAR LAMINECTOMY/DECOMPRESSION MICRODISCECTOMY Bilateral 11/09/2020   Procedure: Laminectomy and Foraminotomy - bilateral - Lumbar Four-Five.;  Surgeon: Julio Sicks, MD;  Location: [facility] OR;  Service: Neurosurgery;  Laterality: Bilateral;  posterior   PROSTATE SURGERY     REMOVAL OF STONES  12/21/2018   Procedure: REMOVAL OF STONES;  Surgeon: Jeani Hawking, MD;  Location: [facility] ENDOSCOPY;   Service: Endoscopy;;   SPHINCTEROTOMY  12/21/2018   Procedure: Dennison Mascot;  Surgeon: Jeani Hawking, MD;  Location: [facility] ENDOSCOPY;  Service: Endoscopy;;   Patient Active Problem List   Diagnosis Date Noted   Osteomyelitis of great toe of left foot (HCC) 06/20/2023   Gangrene of toe of left foot (HCC) 06/19/2023   Normocytic anemia 06/19/2023   Eschar of left great toe 05/22/2023   Near syncope 06/15/2021   Dizziness 06/15/2021   Lumbar stenosis with neurogenic claudication 11/09/2020   CKD stage 3 due to type 2 diabetes mellitus (HCC) 12/14/2019   Asymptomatic bilateral carotid artery stenosis 12/14/2019   Abdominal distention    CAD s/p CABG in 2009 12/14/2018   Acute renal failure superimposed on stage 3 chronic kidney disease (HCC) 12/14/2018   UTI (urinary tract infection) 12/14/2018   HLD (hyperlipidemia) 12/14/2018   Sepsis (HCC) 12/14/2018   Abnormal LFTs    S/P CABG (coronary artery bypass graft) 12/06/2018   Asymptomatic stenosis of right carotid artery 12/06/2018   Claudication in peripheral vascular disease (HCC) 12/06/2018   Orthostatic hypotension due to diabetic dysautonomia 12/06/2018   Elevated liver enzymes 12/06/2018   Abdominal pain 10/12/2018   Abnormal liver function tests 10/12/2018   Foreign body in stomach 10/12/2018   Primary hypertension    GERD (gastroesophageal reflux disease)    Diabetes mellitus without complication (HCC)    Atherosclerosis of native coronary artery of native heart without angina pectoris     PCP: Eloisa Northern, MD   REFERRING PROVIDER: Nadara Mustard, MD  ONSET DATE: 09/14/2023 Prosthesis delivery  REFERRING DIAG: U98.119 (ICD-10-CM) - Left below-knee amputee   THERAPY DIAG:  Other abnormalities of gait and mobility  Unsteadiness on feet  Muscle weakness (generalized)  Impaired functional mobility, balance, gait, and endurance  Rationale for Evaluation and Treatment: Rehabilitation  SUBJECTIVE:   SUBJECTIVE  STATEMENT: He reports about 5/10 pain overall  Pt accompanied by: family member  PERTINENT HISTORY: Left TTA 06/24/23, DM2, retinopathy, GERD, HLD, HTN, CAD, CABG 2009, PAD, prostate CA, CKD stage 3b, arthritis, Laminectomy & Foraminotomy bilateral L4-5,   PAIN:  Are you having pain? Yes, 5/10 pain in left leg.  PRECAUTIONS: Fall  WEIGHT BEARING RESTRICTIONS: No  FALLS: Has patient fallen in last 6 months? Yes. Number of falls 1 no injuries  LIVING ENVIRONMENT: Lives with: lives with their family Lives in: House Home Access: Ramped entrance and single step Home layout: Two level, 1/2 bath on main level, and bedroom Stairs: Yes: Internal: 14 steps; on left going up and can reach both and External: 1+1 steps; none Has following equipment at home: Quad cane small base, Environmental consultant - 2 wheeled, Wheelchair (manual), shower chair, Shower bench, and bed side commode  OCCUPATION: retired from [employer]  PLOF: Independent used cane & walker   PATIENT GOALS: to use prosthesis to walk in home & community, family wants to be safe,  go upstairs to shower  OBJECTIVE:   POSTURE: Evaluation on 09/16/2023:  rounded shoulders, forward head, flexed trunk , and weight shift right  LOWER EXTREMITY ROM:  ROM P:passive  A:active Right 09/24/23 Left 09/24/23  Hip flexion    Hip extension Maisie Fus -5* Thomas -11*  Hip abduction    Hip adduction    Hip internal rotation    Hip external rotation    Knee flexion    Knee extension  Supine P: -7*  Ankle dorsiflexion    Ankle plantarflexion    Ankle inversion    Ankle eversion     (Blank rows = not tested)  LOWER EXTREMITY MMT:  MMT Left 09/24/23  Hip flexion   Hip extension Sidelying 3-/5  Hip abduction Sidleying 3/5  Hip adduction   Hip internal rotation   Hip external rotation   Knee flexion   Knee extension 3-/5  Ankle dorsiflexion   Ankle plantarflexion   Ankle inversion   Ankle eversion   At Evaluation all strength testing  is grossly seated and functionally standing / gait. (Blank rows = not tested)  TRANSFERS: Evaluation on 09/16/2023:  Sit to stand: SBA 20" w/c with armrest to RW limited use of LLE Stand to sit: SBA RW to 20" w/c with armrest limited use of LLE  FUNCTIONAL TESTs:  09/24/2023:  Patient able to stand up from 20" w/c with armrest with RW for stabilization with supervision.  Patient able to maintain upright with RW support without assistance.  Patient able to balance without upper extremity support with minA for 60 seconds.  Deferred at evaluation on 09/16/2023 as limb too edematous to don prosthesis  GAIT: 09/24/2023:  Patient ambulated 50 feet with RW with CGA.  Initially patient walking with a step to pattern with minimal weight shift onto prosthesis.   Deferred at evaluation on 09/16/2023 as limb too edematous to don prosthesis Gait pattern:  Distance walked:  Assistive device utilized:  Level of assistance:  Comments:   CURRENT PROSTHETIC WEAR ASSESSMENT: Evaluation on 09/16/2023:  Patient is dependent with: skin check, residual limb care, care of non-amputated limb, prosthetic cleaning, ply sock cleaning, correct ply sock adjustment, proper wear schedule/adjustment, and proper weight-bearing schedule/adjustment Donning prosthesis: Max A Doffing prosthesis: Min A Prosthetic wear tolerance: 30-45 min 2 days ago when prosthesis delivered.  Prosthetic weight bearing tolerance: 3 minutes partial weight bearing trying to engage pin suspension.  Pt reports distal lateral limb pain from wear 2 days ago.  Edema: pitting edema  Residual limb condition: scab dry over tibial crest 5cm wide X 15 cm long.  Dry skin, normal color & temperature. Cylindrical shape Prosthetic description: silicon liner with anterior portion 9mm thick, secondary pelite foam liner, total contact socket,     TODAY'S TREATMENT:  DATE:  10/26/2023: Prosthetic Training with left Transtibial Amputation: PT checked scab on on residual limb which continues to heal, but not completely gone so recommended to continue to wear vivewear sock for wound healing and to also recommended to increase wear time to 6 hours 2 times per day to continue to build up tissue tolerance and wear tolerance Pt ambulated 150 feet with stand alone tip cane with CGA Pt ambulated with quad tip cane and weaving through cones 15 feet  X2 then around stool, had seated rest break then continued for another round of this. CGA with this Pt ambulated with quad tip cane and stepped over 4 cuff weights (2 and 2.5 lbs) stepping over with left leg first going down and right leg first coming back, had seated rest break then performed one more time. Min A with this Pt ambulated 100 feet with RW mod I out to elevator and rode down to first floor then performed 6 steps with handrail and cane reciprocal pattern to ascend and step to patten to descend with CGA.      10/14/2023: Prosthetic Training with left Transtibial Amputation: Patient's wound on tibial crest continues to have scab.  PT used tweezers to remove dry edges.  PT demo & verbal cues on scar mobilization approximating tissue ant/post and med/lat. Pt and dtr verbalized understanding.   PT demo & verbal cues on neg stairs with single rail & SBQC.  Pt neg 11 steps switching side of rail half way with SBQC in other hand from rail with CGA / verbal cues.   Pt amb 20' X 3 with cane (trail SBQC, cane stand alone tip & cane std tip) all with CGA.  Pt was steadier with cane stand alone tip but has potential for std tip.   Pt amb 130' with cane stand alone tip with 1 LOB initially with minA to stabilize, then close supervision with 2 small LOB that he self-corrected. PT recommended increasing wear to 5 hours 2x/day.  Pt verbalized understanding.    TREATMENT:                                                                                                                              DATE:  10/12/2023: Prosthetic Training with left Transtibial Amputation: Patient arrived today amb with RW (no w/c) with family supervision.   . Pt amb 100 feet and 150 feet with RW with supervision.    PT demo & verbal cues on neg curb and ramp.  Pt neg 6.5" curb and 12* incline with RW with CGA.   Leg press DL 19# [id number], then SL 29# X 15 for Rt LE, 25# X 15 for Lt LE Balance of foam feet apart 30 seconds, then progressed to feet together 30 sec X 3, without UE support Balance with feet apart without UE support working on trunk rotations X 10 bilat, head nods X 10 bilat,  alternating step taps on 6 inch step  X 10 bilat (one UE support for this) 2 UE support and stepping over 6 inch hurdles, 2 hurdles X 6 reps, cues and explanation for stepping over with prosthesis side first. 6 inch step ups with bilat UE support X 5 leading with Rt and X 5 leading with left  10/07/2023: Prosthetic Training with left Transtibial Amputation: Patient arrived today amb with RW (no w/c) with family supervision.   Patient's wound had loose distal scab that PT removed with tweezers.  His wound on tibial crest is now 6-74mm wide with 2 superficial (no longer thick) proximal scabs.  No signs of infection.  PT recommended continuing with Vivewear under liner sock against wound. Increase wear to 4 hrs on, 2 hours off, 2x/day then 3rd wear until bed time.  Switch and clean liners when off the second time.  Pt & granddaughter verbalized understanding.   PT instructed in adjusting ply socks with HO, verbal and demo cues.  Don & ambulate with too few, too many and correct ply fit.  Instructed that ply socks depend on his limb volume.  Over time he will start with more socks in morning which is slow process and during day he may need to add socks as weight bearing can pump fluid out of limb.  Pt and granddaughter verbalized  understanding.   Pt amb 25' X 3 and 6' X 2 with RW with supervision.  PT demo & verbal cues on sit /stand chairs without armrests. Pt return demo with supervision.   PT demo & verbal cues on neg curb and ramp.  Pt neg 6.5" curb and 12* incline with RW with CGA.      TREATMENT:                                                                                                                             DATE:  09/24/2023: Prosthetic Training with left Transtibial Amputation: Patient's family forgot liner.  While his daughter went home to get the liner PT assessed range of motion and manual muscle test noted above. PT also educated patient and granddaughter on making a list for traveling for all things associated with the prosthesis including soaps that he cleans the liner.  PT recommended checking list prior to departure for trip and prior to her departure to return home.  Patient and granddaughter verbalized understanding PT demo and verbal cues on urinating standing up with RW support using 1 hand at a time to pull pants up and down.  Also washing hands with anterior stomach or pelvis in contact with the sink.  PT also verbal cues on how to don long pants with a transtibial prosthesis.  Patient, daughter and granddaughter verbalized understanding. Patient able to stand up from 20" w/c with armrest with RW for stabilization with supervision.  Patient able to maintain upright with RW support without assistance.  Patient able to balance without upper extremity support with minA for 60 seconds. Patient ambulated 50  feet with RW with CGA.  Initially patient walking with a step to pattern with minimal weight shift onto prosthesis.  PT demo and verbal cues on step through pattern.  Patient able to return demonstration with RW support. PT demo and verbal cues on negotiating stairs per family request due to an outing this weekend.  Patient negotiated 3 steps forward facing with 2 rails step to pattern with  contact-guard assistance.  Patient also negotiated 3 steps side facing bilateral upper extremity support on left ascending rail with contact-guard assistance.  Patient, daughter and granddaughter verbalized understanding of technique. PT demo and verbal cues on a car transfer using RW.  Patient able to safely perform car transfer with RW with supervision safely. PT recommended continuing wear of both liner and prosthesis now for 2 hours 3 times per day.  His next appointment is greater than 1 week out.  PT recommended after 1 week if the wound on his anterior tibia does not look irritated or increased in size, then increase wear to 3 hours 3 times per day with the prosthesis off 2 hours at a time between the wears.  Patient, daughter and granddaughter verbalized understanding    TREATMENT:                                                                                                                             DATE:  09/16/2023:  PATIENT EDUCATION: PATIENT EDUCATED ON FOLLOWING PROSTHETIC CARE: Education details:   Skin check, Residual limb care, Prosthetic cleaning, Correct ply sock adjustment, Propper donning, and Proper wear schedule/adjustment Prosthetic wear tolerance: liner only initially 2 hours 3x/day, 7 days/week Person educated: Patient and Child(ren) Education method: Explanation, Demonstration, Tactile cues, and Verbal cues Education comprehension: dtr & granddaughter verbalized understanding, verbal cues required, tactile cues required, and needs further education  HOME EXERCISE PROGRAM:  ASSESSMENT:  CLINICAL IMPRESSION: He is healing with his scap on his anterior residual limb. He has been wearing prosthesis 5 hours X 2 per day, PT recommends to increase this to 6 hours 2X per day and recommend he puts on prosthesis first thing when he gets up to perform standing ADL's such as shaving. He is improving his dynamic balance with ambulating with SPC during higher level challenges.      OBJECTIVE IMPAIRMENTS: Abnormal gait, decreased activity tolerance, decreased balance, decreased endurance, decreased knowledge of condition, decreased knowledge of use of DME, decreased mobility, difficulty walking, decreased ROM, decreased strength, increased edema, prosthetic dependency , and pain.   ACTIVITY LIMITATIONS: carrying, lifting, sitting, standing, stairs, transfers, and locomotion level  PARTICIPATION LIMITATIONS: standing ADLS, cleaning, community activity, and household activity  PERSONAL FACTORS: Age, Fitness, Past/current experiences, Time since onset of injury/illness/exacerbation, and 3+ comorbidities: see PMH  are also affecting patient's functional outcome.   REHAB POTENTIAL: Good  CLINICAL DECISION MAKING: Evolving/moderate complexity  EVALUATION COMPLEXITY: Moderate   GOALS: Goals reviewed with patient? Yes  SHORT TERM GOALS: Target date: 10/15/2023  Patient donnes prosthesis modified independent & family verbalizes proper cleaning. Baseline: SEE OBJECTIVE DATA Goal status: MET 10/07/2023 2.  Patient tolerates prosthesis >8 hrs total /day without skin issues increasing or limb pain >4/10 after standing. Baseline: SEE OBJECTIVE DATA Goal status: MET 10/07/2023  3.  Patient able to reach 7" and look over both shoulders with RW support with supervision. Baseline: SEE OBJECTIVE DATA Goal status: MET 10/14/2023  4. Patient ambulates 30' with RW & prosthesis with supervision. Baseline: SEE OBJECTIVE DATA Goal status: MET 10/07/2023  5. Patient negotiates ramps & curbs with RW & prosthesis with minA. Baseline: SEE OBJECTIVE DATA Goal status: MET 10/07/2023  LONG TERM GOALS: Target date: 12/10/2023  Patient & family demonstrate & verbalize understanding of prosthetic care to enable safe utilization of prosthesis. Baseline: SEE OBJECTIVE DATA Goal status: Ongoing 10/07/2023  Patient tolerates prosthesis wear >90% of awake hours without skin or limb pain  issues. Baseline: SEE OBJECTIVE DATA Goal status: Ongoing 10/07/2023  Task of [employer] with RW support >/= 30/56 to indicate lower fall risk with standing ADLs. Baseline: SEE OBJECTIVE DATA Goal status: Ongoing 10/07/2023  Patient ambulates 67' modified independent &  >200' family supervision with prosthesis & RW. Baseline: SEE OBJECTIVE DATA Goal status: Ongoing 10/07/2023  Patient negotiates ramps, curbs & stairs with single rail with prosthesis & RW with family assistance safely.  Baseline: SEE OBJECTIVE DATA Goal status: Ongoing 10/07/2023   PLAN:  PT FREQUENCY: 2x/week  PT DURATION: 12 weeks  PLANNED INTERVENTIONS: 97164- PT Re-evaluation, 97110-Therapeutic exercises, 97530- Therapeutic activity, 97112- Neuromuscular re-education, 97535- Self Care, [id number]- Gait training, [phone]- Prosthetic training, Patient/Family education, Balance training, Stair training, DME instructions, and physical performance testing  PLAN FOR NEXT SESSION:  Check wound on residual limb and progress wear as indicated,  Prosthetic gait with cane.   April Manson, PT, DPT 10/26/2023, 4:13 PM  Date of referral: 08/31/2023 Referring provider: Aldean Baker, MD Referring diagnosis? W29.562 (ICD-10-CM) - Left below-knee amputee  Treatment diagnosis? (if different than referring diagnosis)  Other abnormalities of gait and mobility  ICD-10-CM: R26.89   Unsteadiness on feet  ICD-10-CM: R26.81   Muscle weakness (generalized)  ICD-10-CM: M62.81   Impaired functional mobility, balance, gait, and endurance  ICD-10-CM: Z74.09   What was this (referring dx) caused by? Surgery (Type: left BKA)  Nature of Condition: Initial Onset (within last 3 months) prosthesis delivery    Laterality: Lt  Current Functional Measure Score: Other standing balance supervision static stance 1 min.   Objective measurements identify impairments when they are compared to normal values, the uninvolved extremity, and prior level of  function.  [x]  Yes  []  No  Objective assessment of functional ability: Moderate functional limitations   Briefly describe symptoms: patient is dependent in prosthetic care & use.  He has impaired mobility. Patient is deconditioned from prolonged limited activity with wound, then amputation.   How did symptoms start: infected wound led to amputation  Average pain intensity:  Last 24 hours: 0  Past week: 0  How often does the pt experience symptoms? Constantly  How much have the symptoms interfered with usual daily activities? Extremely  How has condition changed since care began at this facility? NA - initial visit  In general, how is the patients overall health? Good   BACK PAIN (STarT Back Screening Tool) No

## 2023-10-28 ENCOUNTER — Encounter: Payer: Self-pay | Admitting: Physical Therapy

## 2023-10-28 ENCOUNTER — Ambulatory Visit: Payer: Medicare Other | Admitting: Physical Therapy

## 2023-10-28 DIAGNOSIS — R2681 Unsteadiness on feet: Secondary | ICD-10-CM | POA: Diagnosis not present

## 2023-10-28 DIAGNOSIS — M6281 Muscle weakness (generalized): Secondary | ICD-10-CM | POA: Diagnosis not present

## 2023-10-28 DIAGNOSIS — R2689 Other abnormalities of gait and mobility: Secondary | ICD-10-CM | POA: Diagnosis not present

## 2023-10-28 DIAGNOSIS — Z7409 Other reduced mobility: Secondary | ICD-10-CM

## 2023-10-28 NOTE — Therapy (Signed)
OUTPATIENT PHYSICAL THERAPY PROSTHETIC TREATMENT   Patient Name: Justin [name] MRN: [id number] DOB:12-Apr-1933, 89 y.o., male Today's Date: 10/28/2023  END OF SESSION:  PT End of Session - 10/28/23 1554     Visit Number 7    Number of Visits 25    Date for PT Re-Evaluation 12/10/23    Authorization Type [employer] Medicare    Authorization Time Period $20 co-pay    Progress Note Due on Visit 10    PT Start Time 1553    PT Stop Time 1631    PT Time Calculation (min) 38 min    Equipment Utilized During Treatment Gait belt    Activity Tolerance Patient tolerated treatment well    Behavior During Therapy WFL for tasks assessed/performed                Past Medical History:  Diagnosis Date   Anxiety    Arthritis    Carotid arterial disease (HCC)    CKD (chronic kidney disease)    Coronary artery disease    Diabetes mellitus without complication (HCC)    Diabetic retinopathy (HCC)    NPDR OU   Diverticulitis    Dyspnea    GERD (gastroesophageal reflux disease)    History of kidney stones 2021   Hypercholesteremia    Hypertension    Hypertensive retinopathy    OU   Myocardial infarction [facility]) 2009   Pneumonia    as a child   Prostate cancer (HCC)    UTI (lower urinary tract infection)    Past Surgical History:  Procedure Laterality Date   ABDOMINAL AORTOGRAM W/LOWER EXTREMITY N/A 06/22/2023   Procedure: ABDOMINAL AORTOGRAM W/LOWER EXTREMITY;  Surgeon: Maeola Harman, MD;  Location: [facility] INVASIVE CV LAB;  Service: Cardiovascular;  Laterality: N/A;   ABDOMINAL SURGERY     for diverticulitis; also removed appendix   AMPUTATION Left 06/24/2023   Procedure: LEFT BELOW THE KNEE AMPUTATION;  Surgeon: Nadara Mustard, MD;  Location: [facility] OR;  Service: Orthopedics;  Laterality: Left;   APPENDECTOMY     CARDIAC CATHETERIZATION  2018   CATARACT EXTRACTION     CATARACT EXTRACTION, BILATERAL     CHOLECYSTECTOMY N/A 10/12/2017   Procedure: LAPAROSCOPIC CHOLECYSTECTOMY;   Surgeon: Abigail Miyamoto, MD;  Location: [facility] OR;  Service: General;  Laterality: N/A;   CORONARY ARTERY BYPASS GRAFT  2009   ERCP N/A 12/21/2018   Procedure: ENDOSCOPIC RETROGRADE CHOLANGIOPANCREATOGRAPHY (ERCP);  Surgeon: Jeani Hawking, MD;  Location: [facility] ENDOSCOPY;  Service: Endoscopy;  Laterality: N/A;   ESOPHAGOGASTRODUODENOSCOPY (EGD) WITH PROPOFOL N/A 12/17/2018   Procedure: ESOPHAGOGASTRODUODENOSCOPY (EGD) WITH PROPOFOL;  Surgeon: Jeani Hawking, MD;  Location: [facility] ENDOSCOPY;  Service: Endoscopy;  Laterality: N/A;   EUS Left 12/17/2018   Procedure: UPPER ENDOSCOPIC ULTRASOUND (EUS) LINEAR;  Surgeon: Jeani Hawking, MD;  Location: [facility] ENDOSCOPY;  Service: Endoscopy;  Laterality: Left;   EYE SURGERY     "for bleeding in eye"   HERNIA REPAIR     LEFT HEART CATH AND CORONARY ANGIOGRAPHY N/A 11/04/2016   Procedure: Left Heart Cath and Coronary Angiography;  Surgeon: Yates Decamp, MD;  Location: [facility] INVASIVE CV LAB;  Service: Cardiovascular;  Laterality: N/A;   LOWER EXTREMITY ANGIOGRAPHY N/A 11/04/2016   Procedure: Lower Extremity Angiography;  Surgeon: Yates Decamp, MD;  Location: [facility] INVASIVE CV LAB;  Service: Cardiovascular;  Laterality: N/A;   LOWER EXTREMITY ANGIOGRAPHY N/A 01/03/2020   Procedure: LOWER EXTREMITY ANGIOGRAPHY;  Surgeon: Yates Decamp, MD;  Location: [facility] INVASIVE CV LAB;  Service: Cardiovascular;  Laterality: N/A;   LOWER EXTREMITY ANGIOGRAPHY Bilateral 01/14/2022   Procedure: Lower Extremity Angiography;  Surgeon: Yates Decamp, MD;  Location: [facility] INVASIVE CV LAB;  Service: Cardiovascular;  Laterality: Bilateral;   LUMBAR LAMINECTOMY/DECOMPRESSION MICRODISCECTOMY Bilateral 11/09/2020   Procedure: Laminectomy and Foraminotomy - bilateral - Lumbar Four-Five.;  Surgeon: Julio Sicks, MD;  Location: [facility] OR;  Service: Neurosurgery;  Laterality: Bilateral;  posterior   PROSTATE SURGERY     REMOVAL OF STONES  12/21/2018   Procedure: REMOVAL OF STONES;  Surgeon: Jeani Hawking, MD;  Location: [facility] ENDOSCOPY;   Service: Endoscopy;;   SPHINCTEROTOMY  12/21/2018   Procedure: Dennison Mascot;  Surgeon: Jeani Hawking, MD;  Location: [facility] ENDOSCOPY;  Service: Endoscopy;;   Patient Active Problem List   Diagnosis Date Noted   Osteomyelitis of great toe of left foot (HCC) 06/20/2023   Gangrene of toe of left foot (HCC) 06/19/2023   Normocytic anemia 06/19/2023   Eschar of left great toe 05/22/2023   Near syncope 06/15/2021   Dizziness 06/15/2021   Lumbar stenosis with neurogenic claudication 11/09/2020   CKD stage 3 due to type 2 diabetes mellitus (HCC) 12/14/2019   Asymptomatic bilateral carotid artery stenosis 12/14/2019   Abdominal distention    CAD s/p CABG in 2009 12/14/2018   Acute renal failure superimposed on stage 3 chronic kidney disease (HCC) 12/14/2018   UTI (urinary tract infection) 12/14/2018   HLD (hyperlipidemia) 12/14/2018   Sepsis (HCC) 12/14/2018   Abnormal LFTs    S/P CABG (coronary artery bypass graft) 12/06/2018   Asymptomatic stenosis of right carotid artery 12/06/2018   Claudication in peripheral vascular disease (HCC) 12/06/2018   Orthostatic hypotension due to diabetic dysautonomia 12/06/2018   Elevated liver enzymes 12/06/2018   Abdominal pain 10/12/2018   Abnormal liver function tests 10/12/2018   Foreign body in stomach 10/12/2018   Primary hypertension    GERD (gastroesophageal reflux disease)    Diabetes mellitus without complication (HCC)    Atherosclerosis of native coronary artery of native heart without angina pectoris     PCP: Eloisa Northern, MD   REFERRING PROVIDER: Nadara Mustard, MD  ONSET DATE: 09/14/2023 Prosthesis delivery  REFERRING DIAG: W09.811 (ICD-10-CM) - Left below-knee amputee   THERAPY DIAG:  Other abnormalities of gait and mobility  Unsteadiness on feet  Muscle weakness (generalized)  Impaired functional mobility, balance, gait, and endurance  Rationale for Evaluation and Treatment: Rehabilitation  SUBJECTIVE:   SUBJECTIVE  STATEMENT: He reports about 5/10 pain overall  Pt accompanied by: family member  PERTINENT HISTORY: Left TTA 06/24/23, DM2, retinopathy, GERD, HLD, HTN, CAD, CABG 2009, PAD, prostate CA, CKD stage 3b, arthritis, Laminectomy & Foraminotomy bilateral L4-5,   PAIN:  Are you having pain? Yes, 5/10 pain in left leg.  PRECAUTIONS: Fall  WEIGHT BEARING RESTRICTIONS: No  FALLS: Has patient fallen in last 6 months? Yes. Number of falls 1 no injuries  LIVING ENVIRONMENT: Lives with: lives with their family Lives in: House Home Access: Ramped entrance and single step Home layout: Two level, 1/2 bath on main level, and bedroom Stairs: Yes: Internal: 14 steps; on left going up and can reach both and External: 1+1 steps; none Has following equipment at home: Quad cane small base, Environmental consultant - 2 wheeled, Wheelchair (manual), shower chair, Shower bench, and bed side commode  OCCUPATION: retired from [employer]  PLOF: Independent used cane & walker   PATIENT GOALS: to use prosthesis to walk in home & community, family wants to be safe,  go upstairs to shower  OBJECTIVE:   POSTURE: Evaluation on 09/16/2023:  rounded shoulders, forward head, flexed trunk , and weight shift right  LOWER EXTREMITY ROM:  ROM P:passive  A:active Right 09/24/23 Left 09/24/23  Hip flexion    Hip extension Maisie Fus -5* Thomas -11*  Hip abduction    Hip adduction    Hip internal rotation    Hip external rotation    Knee flexion    Knee extension  Supine P: -7*  Ankle dorsiflexion    Ankle plantarflexion    Ankle inversion    Ankle eversion     (Blank rows = not tested)  LOWER EXTREMITY MMT:  MMT Left 09/24/23  Hip flexion   Hip extension Sidelying 3-/5  Hip abduction Sidleying 3/5  Hip adduction   Hip internal rotation   Hip external rotation   Knee flexion   Knee extension 3-/5  Ankle dorsiflexion   Ankle plantarflexion   Ankle inversion   Ankle eversion   At Evaluation all strength testing  is grossly seated and functionally standing / gait. (Blank rows = not tested)  TRANSFERS: Evaluation on 09/16/2023:  Sit to stand: SBA 20" w/c with armrest to RW limited use of LLE Stand to sit: SBA RW to 20" w/c with armrest limited use of LLE  FUNCTIONAL TESTs:  09/24/2023:  Patient able to stand up from 20" w/c with armrest with RW for stabilization with supervision.  Patient able to maintain upright with RW support without assistance.  Patient able to balance without upper extremity support with minA for 60 seconds.  Deferred at evaluation on 09/16/2023 as limb too edematous to don prosthesis  GAIT: 09/24/2023:  Patient ambulated 50 feet with RW with CGA.  Initially patient walking with a step to pattern with minimal weight shift onto prosthesis.   Deferred at evaluation on 09/16/2023 as limb too edematous to don prosthesis Gait pattern:  Distance walked:  Assistive device utilized:  Level of assistance:  Comments:   CURRENT PROSTHETIC WEAR ASSESSMENT: Evaluation on 09/16/2023:  Patient is dependent with: skin check, residual limb care, care of non-amputated limb, prosthetic cleaning, ply sock cleaning, correct ply sock adjustment, proper wear schedule/adjustment, and proper weight-bearing schedule/adjustment Donning prosthesis: Max A Doffing prosthesis: Min A Prosthetic wear tolerance: 30-45 min 2 days ago when prosthesis delivered.  Prosthetic weight bearing tolerance: 3 minutes partial weight bearing trying to engage pin suspension.  Pt reports distal lateral limb pain from wear 2 days ago.  Edema: pitting edema  Residual limb condition: scab dry over tibial crest 5cm wide X 15 cm long.  Dry skin, normal color & temperature. Cylindrical shape Prosthetic description: silicon liner with anterior portion 9mm thick, secondary pelite foam liner, total contact socket,     TODAY'S TREATMENT:  DATE:  10/28/2023: Prosthetic Training with left Transtibial Amputation: PT provided education to not sit in wheelchair when he is wearing prosthesis to encourage him to get up and walk around his home vs using his wheelchair for ambulation. Also when he is not wearing prosthesis he needs to be in wheelchair or have it beside bed to remind him to not get up and walk since he would be missing the prosthesis.  Pt ambulated 150 feet with stand alone tip cane with CGA Pt ambulated with quad tip cane and weaving through cones 15 feet  X4 CGA with this Pt ambulated with quad tip cane and stepped over 4 cuff weights (2 and 2.5 lbs) stepping over with left leg first going down and right leg first coming back for 2 rounds and CGA with this Pt navigated 6.5 inch curb up/down X 2 reps with quad tip cane and CGA with verbal instructions for technique.  Pt ambulated 20 feet X 2 with cane and head turns and CGA Pt ambulate 20 feet X 2 with cane and head nods and CGA Sidestepping at counter top no UE support 3 round trips Backward walking at counter top with one UE support to end then returning forward walking without UE support. 3 round trips of this Pt ambulated 20 feet without AD, CGA with this.  10/26/2023: Prosthetic Training with left Transtibial Amputation: PT checked scab on on residual limb which continues to heal, but not completely gone so recommended to continue to wear vivewear sock for wound healing and to also recommended to increase wear time to 6 hours 2 times per day to continue to build up tissue tolerance and wear tolerance Pt ambulated 150 feet with stand alone tip cane with CGA Pt ambulated with quad tip cane and weaving through cones 15 feet  X2 then around stool, had seated rest break then continued for another round of this. CGA with this Pt ambulated with quad tip cane and stepped over 4 cuff weights (2 and 2.5 lbs) stepping over with left leg first going  down and right leg first coming back, had seated rest break then performed one more time. Min A with this Pt ambulated 100 feet with RW mod I out to elevator and rode down to first floor then performed 6 steps with handrail and cane reciprocal pattern to ascend and step to patten to descend with CGA.     PATIENT EDUCATION: PATIENT EDUCATED ON FOLLOWING PROSTHETIC CARE: Education details:   Skin check, Residual limb care, Prosthetic cleaning, Correct ply sock adjustment, Propper donning, and Proper wear schedule/adjustment Prosthetic wear tolerance: liner only initially 2 hours 3x/day, 7 days/week Person educated: Patient and Child(ren) Education method: Explanation, Demonstration, Tactile cues, and Verbal cues Education comprehension: dtr & granddaughter verbalized understanding, verbal cues required, tactile cues required, and needs further education  HOME EXERCISE PROGRAM:  ASSESSMENT:  CLINICAL IMPRESSION: He showed improved dynamic balance today and less assistance from AD with gait. He even walked 20 feet at end of session without and AD.     OBJECTIVE IMPAIRMENTS: Abnormal gait, decreased activity tolerance, decreased balance, decreased endurance, decreased knowledge of condition, decreased knowledge of use of DME, decreased mobility, difficulty walking, decreased ROM, decreased strength, increased edema, prosthetic dependency , and pain.   ACTIVITY LIMITATIONS: carrying, lifting, sitting, standing, stairs, transfers, and locomotion level  PARTICIPATION LIMITATIONS: standing ADLS, cleaning, community activity, and household activity  PERSONAL FACTORS: Age, Fitness, Past/current experiences, Time since onset of injury/illness/exacerbation, and 3+ comorbidities: see PMH  are also affecting patient's functional outcome.   REHAB POTENTIAL: Good  CLINICAL DECISION MAKING: Evolving/moderate complexity  EVALUATION COMPLEXITY: Moderate   GOALS: Goals reviewed with patient?  Yes  SHORT TERM GOALS: Target date: 10/15/2023  Patient donnes prosthesis modified independent & family verbalizes proper cleaning. Baseline: SEE OBJECTIVE DATA Goal status: MET 10/07/2023 2.  Patient tolerates prosthesis >8 hrs total /day without skin issues increasing or limb pain >4/10 after standing. Baseline: SEE OBJECTIVE DATA Goal status: MET 10/07/2023  3.  Patient able to reach 7" and look over both shoulders with RW support with supervision. Baseline: SEE OBJECTIVE DATA Goal status: MET 10/14/2023  4. Patient ambulates 35' with RW & prosthesis with supervision. Baseline: SEE OBJECTIVE DATA Goal status: MET 10/07/2023  5. Patient negotiates ramps & curbs with RW & prosthesis with minA. Baseline: SEE OBJECTIVE DATA Goal status: MET 10/07/2023  LONG TERM GOALS: Target date: 12/10/2023  Patient & family demonstrate & verbalize understanding of prosthetic care to enable safe utilization of prosthesis. Baseline: SEE OBJECTIVE DATA Goal status: Ongoing 10/07/2023  Patient tolerates prosthesis wear >90% of awake hours without skin or limb pain issues. Baseline: SEE OBJECTIVE DATA Goal status: Ongoing 10/07/2023  Task of [employer] with RW support >/= 30/56 to indicate lower fall risk with standing ADLs. Baseline: SEE OBJECTIVE DATA Goal status: Ongoing 10/07/2023  Patient ambulates 14' modified independent &  >200' family supervision with prosthesis & RW. Baseline: SEE OBJECTIVE DATA Goal status: Ongoing 10/07/2023  Patient negotiates ramps, curbs & stairs with single rail with prosthesis & RW with family assistance safely.  Baseline: SEE OBJECTIVE DATA Goal status: Ongoing 10/07/2023   PLAN:  PT FREQUENCY: 2x/week  PT DURATION: 12 weeks  PLANNED INTERVENTIONS: 97164- PT Re-evaluation, 97110-Therapeutic exercises, 97530- Therapeutic activity, 97112- Neuromuscular re-education, 97535- Self Care, 16109- Gait training, [phone]- Prosthetic training, Patient/Family education, Balance  training, Stair training, DME instructions, and physical performance testing  PLAN FOR NEXT SESSION:  Check wound on residual limb and progress wear as indicated,  Prosthetic gait with cane.   April Manson, PT, DPT 10/28/2023, 4:29 PM  Date of referral: 08/31/2023 Referring provider: Aldean Baker, MD Referring diagnosis? U98.119 (ICD-10-CM) - Left below-knee amputee  Treatment diagnosis? (if different than referring diagnosis)  Other abnormalities of gait and mobility  ICD-10-CM: R26.89   Unsteadiness on feet  ICD-10-CM: R26.81   Muscle weakness (generalized)  ICD-10-CM: M62.81   Impaired functional mobility, balance, gait, and endurance  ICD-10-CM: Z74.09   What was this (referring dx) caused by? Surgery (Type: left BKA)  Nature of Condition: Initial Onset (within last 3 months) prosthesis delivery    Laterality: Lt  Current Functional Measure Score: Other standing balance supervision static stance 1 min.   Objective measurements identify impairments when they are compared to normal values, the uninvolved extremity, and prior level of function.  [x]  Yes  []  No  Objective assessment of functional ability: Moderate functional limitations   Briefly describe symptoms: patient is dependent in prosthetic care & use.  He has impaired mobility. Patient is deconditioned from prolonged limited activity with wound, then amputation.   How did symptoms start: infected wound led to amputation  Average pain intensity:  Last 24 hours: 0  Past week: 0  How often does the pt experience symptoms? Constantly  How much have the symptoms interfered with usual daily activities? Extremely  How has condition changed since care began at this facility? NA - initial visit  In general, how is the patients overall health?  Good   BACK PAIN (STarT Back Screening Tool) No

## 2023-10-29 ENCOUNTER — Ambulatory Visit: Payer: Self-pay | Admitting: Cardiology

## 2023-11-02 ENCOUNTER — Ambulatory Visit: Payer: Medicare Other | Admitting: Orthopedic Surgery

## 2023-11-02 DIAGNOSIS — Z89512 Acquired absence of left leg below knee: Secondary | ICD-10-CM

## 2023-11-03 ENCOUNTER — Encounter: Payer: Self-pay | Admitting: Orthopedic Surgery

## 2023-11-03 ENCOUNTER — Ambulatory Visit: Payer: Medicare Other | Admitting: Physical Therapy

## 2023-11-03 ENCOUNTER — Encounter: Payer: Self-pay | Admitting: Physical Therapy

## 2023-11-03 DIAGNOSIS — R2689 Other abnormalities of gait and mobility: Secondary | ICD-10-CM | POA: Diagnosis not present

## 2023-11-03 DIAGNOSIS — R2681 Unsteadiness on feet: Secondary | ICD-10-CM

## 2023-11-03 DIAGNOSIS — Z7409 Other reduced mobility: Secondary | ICD-10-CM

## 2023-11-03 DIAGNOSIS — M6281 Muscle weakness (generalized): Secondary | ICD-10-CM | POA: Diagnosis not present

## 2023-11-03 DIAGNOSIS — R42 Dizziness and giddiness: Secondary | ICD-10-CM

## 2023-11-03 NOTE — Progress Notes (Signed)
Office Visit Note   Patient: Justin [name]           Date of Birth: Nov 22, 1932           MRN: [id number] Visit Date: 11/02/2023              Requested by: Eloisa Northern, MD [address] [place],  [place] [address] PCP: Eloisa Northern, MD  Chief Complaint  Patient presents with   Left Leg - Follow-up    06/24/2023 left BKA       HPI: Patient is a 89 year old gentleman who is over 4 months status post left below-knee amputation.  Patient has followed up with [facility].  He states he has completed 6 weeks with physical therapy.  Assessment & Plan: Visit Diagnoses:  1. Left below-knee amputee [facility])     Plan: Patient's socket is 2 months old but he has had significant loss of residual volume in his limb.  Will follow-up in 3 months and plan for proceeding with a new socket.  Follow-Up Instructions: Return in about 3 months (around 01/30/2024).   Ortho Exam  Patient is alert, oriented, no adenopathy, well-dressed, normal affect, normal respiratory effort. Examination the wound over the tibial crest is resolving there is no open wounds no cellulitis no drainage.  Imaging: No results found.   Labs: Lab Results  Component Value Date   HGBA1C 8.8 (H) 06/19/2023   HGBA1C 7.2 (H) 11/06/2020   HGBA1C 8.0 (H) 10/08/2017   ESRSEDRATE 106 (H) 06/19/2023   ESRSEDRATE 16 12/01/2017   CRP 4.5 (H) 06/19/2023   REPTSTATUS 06/25/2023 FINAL 06/20/2023   CULT  06/20/2023    NO GROWTH 5 DAYS Performed at [facility], [address], [place], [place] [address]    LABORGA KLEBSIELLA OXYTOCA (A) 03/08/2021     Lab Results  Component Value Date   ALBUMIN 2.9 (L) 06/16/2021   ALBUMIN 3.4 (L) 06/14/2021   ALBUMIN 4.1 03/21/2019    Lab Results  Component Value Date   MG 1.8 06/16/2021   MG 1.6 (L) 12/23/2018   MG 1.7 12/22/2018   No results found for: "VD25OH"  No results found for: "PREALBUMIN"    Latest Ref Rng & Units 06/27/2023    3:10 AM 06/26/2023    6:45 PM 06/25/2023     3:47 AM  CBC EXTENDED  WBC 4.0 - 10.5 K/uL 11.7  12.9  16.5   RBC 4.22 - 5.81 MIL/uL 3.30  3.18  2.65   Hemoglobin 13.0 - 17.0 g/dL 8.7  8.4  7.2   HCT 82.9 - 52.0 % 27.1  26.3  22.3   Platelets 150 - 400 K/uL 245  229  219      There is no height or weight on file to calculate BMI.  Orders:  No orders of the defined types were placed in this encounter.  No orders of the defined types were placed in this encounter.    Procedures: No procedures performed  Clinical Data: No additional findings.  ROS:  All other systems negative, except as noted in the HPI. Review of Systems  Objective: Vital Signs: There were no vitals taken for this visit.  Specialty Comments:  No specialty comments available.  PMFS History: Patient Active Problem List   Diagnosis Date Noted   Osteomyelitis of great toe of left foot (HCC) 06/20/2023   Gangrene of toe of left foot (HCC) 06/19/2023   Normocytic anemia 06/19/2023   Eschar of left  great toe 05/22/2023   Near syncope 06/15/2021   Dizziness 06/15/2021   Lumbar stenosis with neurogenic claudication 11/09/2020   CKD stage 3 due to type 2 diabetes mellitus (HCC) 12/14/2019   Asymptomatic bilateral carotid artery stenosis 12/14/2019   Abdominal distention    CAD s/p CABG in 2009 12/14/2018   Acute renal failure superimposed on stage 3 chronic kidney disease (HCC) 12/14/2018   UTI (urinary tract infection) 12/14/2018   HLD (hyperlipidemia) 12/14/2018   Sepsis (HCC) 12/14/2018   Abnormal LFTs    S/P CABG (coronary artery bypass graft) 12/06/2018   Asymptomatic stenosis of right carotid artery 12/06/2018   Claudication in peripheral vascular disease (HCC) 12/06/2018   Orthostatic hypotension due to diabetic dysautonomia 12/06/2018   Elevated liver enzymes 12/06/2018   Abdominal pain 10/12/2018   Abnormal liver function tests 10/12/2018   Foreign body in stomach 10/12/2018   Primary hypertension    GERD (gastroesophageal reflux  disease)    Diabetes mellitus without complication (HCC)    Atherosclerosis of native coronary artery of native heart without angina pectoris    Past Medical History:  Diagnosis Date   Anxiety    Arthritis    Carotid arterial disease (HCC)    CKD (chronic kidney disease)    Coronary artery disease    Diabetes mellitus without complication (HCC)    Diabetic retinopathy (HCC)    NPDR OU   Diverticulitis    Dyspnea    GERD (gastroesophageal reflux disease)    History of kidney stones 2021   Hypercholesteremia    Hypertension    Hypertensive retinopathy    OU   Myocardial infarction [facility]) 2009   Pneumonia    as a child   Prostate cancer (HCC)    UTI (lower urinary tract infection)     Family History  Problem Relation Age of Onset   Diabetes Father    Diabetes Maternal Aunt    Diabetes Maternal Uncle    Diabetes Maternal Grandmother    Heart disease Sister    Diabetes Brother    Heart disease Sister     Past Surgical History:  Procedure Laterality Date   ABDOMINAL AORTOGRAM W/LOWER EXTREMITY N/A 06/22/2023   Procedure: ABDOMINAL AORTOGRAM W/LOWER EXTREMITY;  Surgeon: Maeola Harman, MD;  Location: [facility] INVASIVE CV LAB;  Service: Cardiovascular;  Laterality: N/A;   ABDOMINAL SURGERY     for diverticulitis; also removed appendix   AMPUTATION Left 06/24/2023   Procedure: LEFT BELOW THE KNEE AMPUTATION;  Surgeon: Nadara Mustard, MD;  Location: [facility] OR;  Service: Orthopedics;  Laterality: Left;   APPENDECTOMY     CARDIAC CATHETERIZATION  2018   CATARACT EXTRACTION     CATARACT EXTRACTION, BILATERAL     CHOLECYSTECTOMY N/A 10/12/2017   Procedure: LAPAROSCOPIC CHOLECYSTECTOMY;  Surgeon: Abigail Miyamoto, MD;  Location[facility] OR;  Service: General;  Laterality: N/A;   CORONARY ARTERY BYPASS GRAFT  2009   ERCP N/A 12/21/2018   Procedure: ENDOSCOPIC RETROGRADE CHOLANGIOPANCREATOGRAPHY (ERCP);  Surgeon: Jeani Hawking, MD;  Location: [facility] ENDOSCOPY;  Service: Endoscopy;   Laterality: N/A;   ESOPHAGOGASTRODUODENOSCOPY (EGD) WITH PROPOFOL N/A 12/17/2018   Procedure: ESOPHAGOGASTRODUODENOSCOPY (EGD) WITH PROPOFOL;  Surgeon: Jeani Hawking, MD;  Location: [facility] ENDOSCOPY;  Service: Endoscopy;  Laterality: N/A;   EUS Left 12/17/2018   Procedure: UPPER ENDOSCOPIC ULTRASOUND (EUS) LINEAR;  Surgeon: Jeani Hawking, MD;  Location: [facility] ENDOSCOPY;  Service: Endoscopy;  Laterality: Left;   EYE SURGERY     "for bleeding in eye"  HERNIA REPAIR     LEFT HEART CATH AND CORONARY ANGIOGRAPHY N/A 11/04/2016   Procedure: Left Heart Cath and Coronary Angiography;  Surgeon: Yates Decamp, MD;  Location: [facility] INVASIVE CV LAB;  Service: Cardiovascular;  Laterality: N/A;   LOWER EXTREMITY ANGIOGRAPHY N/A 11/04/2016   Procedure: Lower Extremity Angiography;  Surgeon: Yates Decamp, MD;  Location: [facility] INVASIVE CV LAB;  Service: Cardiovascular;  Laterality: N/A;   LOWER EXTREMITY ANGIOGRAPHY N/A 01/03/2020   Procedure: LOWER EXTREMITY ANGIOGRAPHY;  Surgeon: Yates Decamp, MD;  Location: [facility] INVASIVE CV LAB;  Service: Cardiovascular;  Laterality: N/A;   LOWER EXTREMITY ANGIOGRAPHY Bilateral 01/14/2022   Procedure: Lower Extremity Angiography;  Surgeon: Yates Decamp, MD;  Location: [facility] INVASIVE CV LAB;  Service: Cardiovascular;  Laterality: Bilateral;   LUMBAR LAMINECTOMY/DECOMPRESSION MICRODISCECTOMY Bilateral 11/09/2020   Procedure: Laminectomy and Foraminotomy - bilateral - Lumbar Four-Five.;  Surgeon: Julio Sicks, MD;  Location: [facility] OR;  Service: Neurosurgery;  Laterality: Bilateral;  posterior   PROSTATE SURGERY     REMOVAL OF STONES  12/21/2018   Procedure: REMOVAL OF STONES;  Surgeon: Jeani Hawking, MD;  Location: [facility] ENDOSCOPY;  Service: Endoscopy;;   SPHINCTEROTOMY  12/21/2018   Procedure: Dennison Mascot;  Surgeon: Jeani Hawking, MD;  Location: [facility] ENDOSCOPY;  Service: Endoscopy;;   Social History   Occupational History   Not on file  Tobacco Use   Smoking status: Former    Current packs/day: 0.25    Average  packs/day: 0.3 packs/day for 2.0 years (0.5 ttl pk-yrs)    Types: Cigarettes   Smokeless tobacco: Never   Tobacco comments:    when he was a teenage age [age]  Vaping Use   Vaping status: Never Used  Substance and Sexual Activity   Alcohol use: No   Drug use: No   Sexual activity: Not Currently

## 2023-11-03 NOTE — Therapy (Signed)
 OUTPATIENT PHYSICAL THERAPY PROSTHETIC TREATMENT   Patient Name: Justin [name] MRN: [id number] DOB:20-Jun-1933, 89 y.o., male Today's Date: 11/03/2023  END OF SESSION:  PT End of Session - 11/03/23 1518     Visit Number 8    Number of Visits 25    Date for PT Re-Evaluation 12/10/23    Authorization Type [employer] Medicare    Authorization Time Period $20 co-pay    Progress Note Due on Visit 10    PT Start Time 1518    Equipment Utilized During Treatment Gait belt    Activity Tolerance Patient tolerated treatment well    Behavior During Therapy WFL for tasks assessed/performed                 Past Medical History:  Diagnosis Date   Anxiety    Arthritis    Carotid arterial disease (HCC)    CKD (chronic kidney disease)    Coronary artery disease    Diabetes mellitus without complication (HCC)    Diabetic retinopathy (HCC)    NPDR OU   Diverticulitis    Dyspnea    GERD (gastroesophageal reflux disease)    History of kidney stones 2021   Hypercholesteremia    Hypertension    Hypertensive retinopathy    OU   Myocardial infarction [facility]) 2009   Pneumonia    as a child   Prostate cancer (HCC)    UTI (lower urinary tract infection)    Past Surgical History:  Procedure Laterality Date   ABDOMINAL AORTOGRAM W/LOWER EXTREMITY N/A 06/22/2023   Procedure: ABDOMINAL AORTOGRAM W/LOWER EXTREMITY;  Surgeon: Sheree Penne Bruckner, MD;  Location: [facility] INVASIVE CV LAB;  Service: Cardiovascular;  Laterality: N/A;   ABDOMINAL SURGERY     for diverticulitis; also removed appendix   AMPUTATION Left 06/24/2023   Procedure: LEFT BELOW THE KNEE AMPUTATION;  Surgeon: Harden Jerona GAILS, MD;  Location: [facility] OR;  Service: Orthopedics;  Laterality: Left;   APPENDECTOMY     CARDIAC CATHETERIZATION  2018   CATARACT EXTRACTION     CATARACT EXTRACTION, BILATERAL     CHOLECYSTECTOMY N/A 10/12/2017   Procedure: LAPAROSCOPIC CHOLECYSTECTOMY;  Surgeon: Vernetta Berg, MD;  Location: [facility] OR;   Service: General;  Laterality: N/A;   CORONARY ARTERY BYPASS GRAFT  2009   ERCP N/A 12/21/2018   Procedure: ENDOSCOPIC RETROGRADE CHOLANGIOPANCREATOGRAPHY (ERCP);  Surgeon: Rollin Dover, MD;  Location: [facility] ENDOSCOPY;  Service: Endoscopy;  Laterality: N/A;   ESOPHAGOGASTRODUODENOSCOPY (EGD) WITH PROPOFOL  N/A 12/17/2018   Procedure: ESOPHAGOGASTRODUODENOSCOPY (EGD) WITH PROPOFOL ;  Surgeon: Rollin Dover, MD;  Location: [facility] ENDOSCOPY;  Service: Endoscopy;  Laterality: N/A;   EUS Left 12/17/2018   Procedure: UPPER ENDOSCOPIC ULTRASOUND (EUS) LINEAR;  Surgeon: Rollin Dover, MD;  Location: [facility] ENDOSCOPY;  Service: Endoscopy;  Laterality: Left;   EYE SURGERY     for bleeding in eye   HERNIA REPAIR     LEFT HEART CATH AND CORONARY ANGIOGRAPHY N/A 11/04/2016   Procedure: Left Heart Cath and Coronary Angiography;  Surgeon: Gordy Bergamo, MD;  Location: [facility] INVASIVE CV LAB;  Service: Cardiovascular;  Laterality: N/A;   LOWER EXTREMITY ANGIOGRAPHY N/A 11/04/2016   Procedure: Lower Extremity Angiography;  Surgeon: Gordy Bergamo, MD;  Location: [facility] INVASIVE CV LAB;  Service: Cardiovascular;  Laterality: N/A;   LOWER EXTREMITY ANGIOGRAPHY N/A 01/03/2020   Procedure: LOWER EXTREMITY ANGIOGRAPHY;  Surgeon: Bergamo Gordy, MD;  Location: [facility] INVASIVE CV LAB;  Service: Cardiovascular;  Laterality: N/A;   LOWER EXTREMITY ANGIOGRAPHY Bilateral 01/14/2022  Procedure: Lower Extremity Angiography;  Surgeon: Ladona Heinz, MD;  Location: [facility] INVASIVE CV LAB;  Service: Cardiovascular;  Laterality: Bilateral;   LUMBAR LAMINECTOMY/DECOMPRESSION MICRODISCECTOMY Bilateral 11/09/2020   Procedure: Laminectomy and Foraminotomy - bilateral - Lumbar Four-Five.;  Surgeon: Louis Shove, MD;  Location: [facility] OR;  Service: Neurosurgery;  Laterality: Bilateral;  posterior   PROSTATE SURGERY     REMOVAL OF STONES  12/21/2018   Procedure: REMOVAL OF STONES;  Surgeon: Rollin Dover, MD;  Location: [facility] ENDOSCOPY;  Service: Endoscopy;;   SPHINCTEROTOMY  12/21/2018    Procedure: ANNETT;  Surgeon: Rollin Dover, MD;  Location: [facility] ENDOSCOPY;  Service: Endoscopy;;   Patient Active Problem List   Diagnosis Date Noted   Osteomyelitis of great toe of left foot (HCC) 06/20/2023   Gangrene of toe of left foot (HCC) 06/19/2023   Normocytic anemia 06/19/2023   Eschar of left great toe 05/22/2023   Near syncope 06/15/2021   Dizziness 06/15/2021   Lumbar stenosis with neurogenic claudication 11/09/2020   CKD stage 3 due to type 2 diabetes mellitus (HCC) 12/14/2019   Asymptomatic bilateral carotid artery stenosis 12/14/2019   Abdominal distention    CAD s/p CABG in 2009 12/14/2018   Acute renal failure superimposed on stage 3 chronic kidney disease (HCC) 12/14/2018   UTI (urinary tract infection) 12/14/2018   HLD (hyperlipidemia) 12/14/2018   Sepsis (HCC) 12/14/2018   Abnormal LFTs    S/P CABG (coronary artery bypass graft) 12/06/2018   Asymptomatic stenosis of right carotid artery 12/06/2018   Claudication in peripheral vascular disease (HCC) 12/06/2018   Orthostatic hypotension due to diabetic dysautonomia 12/06/2018   Elevated liver enzymes 12/06/2018   Abdominal pain 10/12/2018   Abnormal liver function tests 10/12/2018   Foreign body in stomach 10/12/2018   Primary hypertension    GERD (gastroesophageal reflux disease)    Diabetes mellitus without complication (HCC)    Atherosclerosis of native coronary artery of native heart without angina pectoris     PCP: Caleen Dirks, MD   REFERRING PROVIDER: Harden Jerona GAILS, MD  ONSET DATE: 09/14/2023 Prosthesis delivery  REFERRING DIAG: S10.487 (ICD-10-CM) - Left below-knee amputee   THERAPY DIAG:  Other abnormalities of gait and mobility  Unsteadiness on feet  Muscle weakness (generalized)  Impaired functional mobility, balance, gait, and endurance  Dizziness and giddiness  Rationale for Evaluation and Treatment: Rehabilitation  SUBJECTIVE:   SUBJECTIVE STATEMENT: He is wearing  prosthesis all awake hours. He has some tibia pain.   Pt accompanied by: family member  PERTINENT HISTORY: Left TTA 06/24/23, DM2, retinopathy, GERD, HLD, HTN, CAD, CABG 2009, PAD, prostate CA, CKD stage 3b, arthritis, Laminectomy & Foraminotomy bilateral L4-5,   PAIN:  Are you having pain? Yes,  4/10 pain in left leg.  PRECAUTIONS: Fall  WEIGHT BEARING RESTRICTIONS: No  FALLS: Has patient fallen in last 6 months? Yes. Number of falls 1 no injuries  LIVING ENVIRONMENT: Lives with: lives with their family Lives in: House Home Access: Ramped entrance and single step Home layout: Two level, 1/2 bath on main level, and bedroom Stairs: Yes: Internal: 14 steps; on left going up and can reach both and External: 1+1 steps; none Has following equipment at home: Quad cane small base, Environmental Consultant - 2 wheeled, Wheelchair (manual), shower chair, Shower bench, and bed side commode  OCCUPATION: retired from naval architect  PLOF: Independent used cane & walker   PATIENT GOALS: to use prosthesis to walk in home & community, family wants to be safe,  go upstairs  to shower  OBJECTIVE:   POSTURE: Evaluation on 09/16/2023:  rounded shoulders, forward head, flexed trunk , and weight shift right  LOWER EXTREMITY ROM:  ROM P:passive  A:active Right 09/24/23 Left 09/24/23  Hip flexion    Hip extension Debby -5* Thomas -11*  Hip abduction    Hip adduction    Hip internal rotation    Hip external rotation    Knee flexion    Knee extension  Supine P: -7*  Ankle dorsiflexion    Ankle plantarflexion    Ankle inversion    Ankle eversion     (Blank rows = not tested)  LOWER EXTREMITY MMT:  MMT Left 09/24/23  Hip flexion   Hip extension Sidelying 3-/5  Hip abduction Sidleying 3/5  Hip adduction   Hip internal rotation   Hip external rotation   Knee flexion   Knee extension 3-/5  Ankle dorsiflexion   Ankle plantarflexion   Ankle inversion   Ankle eversion   At Evaluation all strength  testing is grossly seated and functionally standing / gait. (Blank rows = not tested)  TRANSFERS: Evaluation on 09/16/2023:  Sit to stand: SBA 20 w/c with armrest to RW limited use of LLE Stand to sit: SBA RW to 20 w/c with armrest limited use of LLE  FUNCTIONAL TESTs:  09/24/2023:  Patient able to stand up from 20 w/c with armrest with RW for stabilization with supervision.  Patient able to maintain upright with RW support without assistance.  Patient able to balance without upper extremity support with minA for 60 seconds.  Deferred at evaluation on 09/16/2023 as limb too edematous to don prosthesis  GAIT: 09/24/2023:  Patient ambulated 50 feet with RW with CGA.  Initially patient walking with a step to pattern with minimal weight shift onto prosthesis.   Deferred at evaluation on 09/16/2023 as limb too edematous to don prosthesis Gait pattern:  Distance walked:  Assistive device utilized:  Level of assistance:  Comments:   CURRENT PROSTHETIC WEAR ASSESSMENT: Evaluation on 09/16/2023:  Patient is dependent with: skin check, residual limb care, care of non-amputated limb, prosthetic cleaning, ply sock cleaning, correct ply sock adjustment, proper wear schedule/adjustment, and proper weight-bearing schedule/adjustment Donning prosthesis: Max A Doffing prosthesis: Min A Prosthetic wear tolerance: 30-45 min 2 days ago when prosthesis delivered.  Prosthetic weight bearing tolerance: 3 minutes partial weight bearing trying to engage pin suspension.  Pt reports distal lateral limb pain from wear 2 days ago.  Edema: pitting edema  Residual limb condition: scab dry over tibial crest 5cm wide X 15 cm long.  Dry skin, normal color & temperature. Cylindrical shape Prosthetic description: silicon liner with anterior portion 9mm thick, secondary pelite foam liner, total contact socket,     TODAY'S TREATMENT:                                                                                                                              DATE:  11/03/2023: Prosthetic Training with left Transtibial Amputation: Wound is 3mm X 10mm superficial scab.  PT reviewed adjusting ply socks as his tibia pain may be from too few socks.   PT demo & verbal cues on hamstring set seated 10 reps, standing 10 reps and stance in gait 10 steps.  Pt able to return demo understanding including recommendation for all 3 positions 10 reps 3x/day or if tibia pain increases.  Pt amb 400' with cane working on scanning maintaining path/pace and alternating eyes open 3 steps / eyes closed 3 steps to simulate walking in dark.  All with minA/CGA with tactile & verbal cues.  Back pain limited distance.  PT instructed in negotiating ramp & curb with cane technique maintaining momentum.  Pt neg 6.5 curb with cane 3 reps with modA 1st rep and minA 2nd/3rd rep. PT educated how to use nose of car on left side to practice with family.  Pt neg 12* ramp with cane with minA 3 reps.  Pt neg 11 stairs with step-to pattern with single rail & cane stand alone tip with MinA/CGA and PT cues on technique.  PT verbally educated on entering / exiting shower and kneel on residual limb to clean buttocks. Pt & dtr verbalized understanding.      TREATMENT:                                                                                                                             DATE:  10/28/2023: Prosthetic Training with left Transtibial Amputation: PT provided education to not sit in wheelchair when he is wearing prosthesis to encourage him to get up and walk around his home vs using his wheelchair for ambulation. Also when he is not wearing prosthesis he needs to be in wheelchair or have it beside bed to remind him to not get up and walk since he would be missing the prosthesis.  Pt ambulated 150 feet with stand alone tip cane with CGA Pt ambulated with quad tip cane and weaving through cones 15 feet  X4 CGA with this Pt ambulated with quad tip  cane and stepped over 4 cuff weights (2 and 2.5 lbs) stepping over with left leg first going down and right leg first coming back for 2 rounds and CGA with this Pt navigated 6.5 inch curb up/down X 2 reps with quad tip cane and CGA with verbal instructions for technique.  Pt ambulated 20 feet X 2 with cane and head turns and CGA Pt ambulate 20 feet X 2 with cane and head nods and CGA Sidestepping at counter top no UE support 3 round trips Backward walking at counter top with one UE support to end then returning forward walking without UE support. 3 round trips of this Pt ambulated 20 feet without AD, CGA with this.  10/26/2023: Prosthetic Training with left Transtibial Amputation: PT checked scab on on residual limb which continues to heal, but not completely gone so recommended to  continue to wear vivewear sock for wound healing and to also recommended to increase wear time to 6 hours 2 times per day to continue to build up tissue tolerance and wear tolerance Pt ambulated 150 feet with stand alone tip cane with CGA Pt ambulated with quad tip cane and weaving through cones 15 feet  X2 then around stool, had seated rest break then continued for another round of this. CGA with this Pt ambulated with quad tip cane and stepped over 4 cuff weights (2 and 2.5 lbs) stepping over with left leg first going down and right leg first coming back, had seated rest break then performed one more time. Min A with this Pt ambulated 100 feet with RW mod I out to elevator and rode down to first floor then performed 6 steps with handrail and cane reciprocal pattern to ascend and step to patten to descend with CGA.     PATIENT EDUCATION: PATIENT EDUCATED ON FOLLOWING PROSTHETIC CARE: Education details:   Skin check, Residual limb care, Prosthetic cleaning, Correct ply sock adjustment, Propper donning, and Proper wear schedule/adjustment Prosthetic wear tolerance: liner only initially 2 hours 3x/day, 7  days/week Person educated: Patient and Child(ren) Education method: Explanation, Demonstration, Tactile cues, and Verbal cues Education comprehension: dtr & granddaughter verbalized understanding, verbal cues required, tactile cues required, and needs further education  HOME EXERCISE PROGRAM:  ASSESSMENT:  CLINICAL IMPRESSION: PT worked on scanning & low vision balance in gait.  PT also instructed in ramps, curbs and stairs single rail with cane which he has general understanding.    OBJECTIVE IMPAIRMENTS: Abnormal gait, decreased activity tolerance, decreased balance, decreased endurance, decreased knowledge of condition, decreased knowledge of use of DME, decreased mobility, difficulty walking, decreased ROM, decreased strength, increased edema, prosthetic dependency , and pain.   ACTIVITY LIMITATIONS: carrying, lifting, sitting, standing, stairs, transfers, and locomotion level  PARTICIPATION LIMITATIONS: standing ADLS, cleaning, community activity, and household activity  PERSONAL FACTORS: Age, Fitness, Past/current experiences, Time since onset of injury/illness/exacerbation, and 3+ comorbidities: see PMH  are also affecting patient's functional outcome.   REHAB POTENTIAL: Good  CLINICAL DECISION MAKING: Evolving/moderate complexity  EVALUATION COMPLEXITY: Moderate   GOALS: Goals reviewed with patient? Yes  SHORT TERM GOALS: Target date: 10/15/2023  Patient donnes prosthesis modified independent & family verbalizes proper cleaning. Baseline: SEE OBJECTIVE DATA Goal status: MET 10/07/2023 2.  Patient tolerates prosthesis >8 hrs total /day without skin issues increasing or limb pain >4/10 after standing. Baseline: SEE OBJECTIVE DATA Goal status: MET 10/07/2023  3.  Patient able to reach 7 and look over both shoulders with RW support with supervision. Baseline: SEE OBJECTIVE DATA Goal status: MET 10/14/2023  4. Patient ambulates 74' with RW & prosthesis with  supervision. Baseline: SEE OBJECTIVE DATA Goal status: MET 10/07/2023  5. Patient negotiates ramps & curbs with RW & prosthesis with minA. Baseline: SEE OBJECTIVE DATA Goal status: MET 10/07/2023  LONG TERM GOALS: Target date: 12/10/2023  Patient & family demonstrate & verbalize understanding of prosthetic care to enable safe utilization of prosthesis. Baseline: SEE OBJECTIVE DATA Goal status: Ongoing 10/07/2023  Patient tolerates prosthesis wear >90% of awake hours without skin or limb pain issues. Baseline: SEE OBJECTIVE DATA Goal status: Ongoing 10/07/2023  Task of [employer] with RW support >/= 30/56 to indicate lower fall risk with standing ADLs. Baseline: SEE OBJECTIVE DATA Goal status: Ongoing 10/07/2023  Patient ambulates 60' modified independent &  >200' family supervision with prosthesis & RW. Baseline: SEE OBJECTIVE DATA Goal status:  Ongoing 10/07/2023  Patient negotiates ramps, curbs & stairs with single rail with prosthesis & RW with family assistance safely.  Baseline: SEE OBJECTIVE DATA Goal status: Ongoing 10/07/2023   PLAN:  PT FREQUENCY: 2x/week  PT DURATION: 12 weeks  PLANNED INTERVENTIONS: 97164- PT Re-evaluation, 97110-Therapeutic exercises, 97530- Therapeutic activity, 97112- Neuromuscular re-education, 97535- Self Care, [id number]- Gait training, [id number]- Prosthetic training, Patient/Family education, Balance training, Stair training, DME instructions, and physical performance testing  PLAN FOR NEXT SESSION:    Check wound on residual limb and progress wear as indicated,  Prosthetic gait with cane including scanning and advanced balance in gait.   Grayce Spatz, PT, DPT 11/03/2023, 3:32 PM  Date of referral: 08/31/2023 Referring provider: Jerona Sage, MD Referring diagnosis? S10.487 (ICD-10-CM) - Left below-knee amputee  Treatment diagnosis? (if different than referring diagnosis)  Other abnormalities of gait and mobility  ICD-10-CM: R26.89   Unsteadiness on feet   ICD-10-CM: R26.81   Muscle weakness (generalized)  ICD-10-CM: M62.81   Impaired functional mobility, balance, gait, and endurance  ICD-10-CM: Z74.09   What was this (referring dx) caused by? Surgery (Type: left BKA)  Nature of Condition: Initial Onset (within last 3 months) prosthesis delivery    Laterality: Lt  Current Functional Measure Score: Other standing balance supervision static stance 1 min.   Objective measurements identify impairments when they are compared to normal values, the uninvolved extremity, and prior level of function.  [x]  Yes  []  No  Objective assessment of functional ability: Moderate functional limitations   Briefly describe symptoms: patient is dependent in prosthetic care & use.  He has impaired mobility. Patient is deconditioned from prolonged limited activity with wound, then amputation.   How did symptoms start: infected wound led to amputation  Average pain intensity:  Last 24 hours: 0  Past week: 0  How often does the pt experience symptoms? Constantly  How much have the symptoms interfered with usual daily activities? Extremely  How has condition changed since care began at this facility? NA - initial visit  In general, how is the patients overall health? Good   BACK PAIN (STarT Back Screening Tool) No

## 2023-11-05 ENCOUNTER — Ambulatory Visit: Payer: Medicare Other | Admitting: Physical Therapy

## 2023-11-05 ENCOUNTER — Encounter: Payer: Self-pay | Admitting: Physical Therapy

## 2023-11-05 DIAGNOSIS — R2689 Other abnormalities of gait and mobility: Secondary | ICD-10-CM

## 2023-11-05 DIAGNOSIS — Z7409 Other reduced mobility: Secondary | ICD-10-CM

## 2023-11-05 DIAGNOSIS — R2681 Unsteadiness on feet: Secondary | ICD-10-CM

## 2023-11-05 DIAGNOSIS — M6281 Muscle weakness (generalized): Secondary | ICD-10-CM

## 2023-11-05 NOTE — Therapy (Signed)
 OUTPATIENT PHYSICAL THERAPY PROSTHETIC TREATMENT   Patient Name: Justin [name] MRN: [id number] DOB:[birth year], 89 y.o., male Today's Date: 11/05/2023  END OF SESSION:  PT End of Session - 11/05/23 1555     Visit Number 9    Number of Visits 25    Date for PT Re-Evaluation 12/10/23    Authorization Type [employer] Medicare    Authorization Time Period $20 co-pay    Progress Note Due on Visit 10    PT Start Time 1553    PT Stop Time 1631    PT Time Calculation (min) 38 min    Equipment Utilized During Treatment Gait belt    Activity Tolerance Patient tolerated treatment well    Behavior During Therapy WFL for tasks assessed/performed                 Past Medical History:  Diagnosis Date   Anxiety    Arthritis    Carotid arterial disease (HCC)    CKD (chronic kidney disease)    Coronary artery disease    Diabetes mellitus without complication (HCC)    Diabetic retinopathy (HCC)    NPDR OU   Diverticulitis    Dyspnea    GERD (gastroesophageal reflux disease)    History of kidney stones 2021   Hypercholesteremia    Hypertension    Hypertensive retinopathy    OU   Myocardial infarction [facility]) 2009   Pneumonia    as a child   Prostate cancer (HCC)    UTI (lower urinary tract infection)    Past Surgical History:  Procedure Laterality Date   ABDOMINAL AORTOGRAM W/LOWER EXTREMITY N/A 06/22/2023   Procedure: ABDOMINAL AORTOGRAM W/LOWER EXTREMITY;  Surgeon: Sheree Penne Bruckner, MD;  Location: [facility] INVASIVE CV LAB;  Service: Cardiovascular;  Laterality: N/A;   ABDOMINAL SURGERY     for diverticulitis; also removed appendix   AMPUTATION Left 06/24/2023   Procedure: LEFT BELOW THE KNEE AMPUTATION;  Surgeon: Harden Jerona GAILS, MD;  Location: [facility] OR;  Service: Orthopedics;  Laterality: Left;   APPENDECTOMY     CARDIAC CATHETERIZATION  2018   CATARACT EXTRACTION     CATARACT EXTRACTION, BILATERAL     CHOLECYSTECTOMY N/A 10/12/2017   Procedure: LAPAROSCOPIC CHOLECYSTECTOMY;   Surgeon: Vernetta Berg, MD;  Location: [facility] OR;  Service: General;  Laterality: N/A;   CORONARY ARTERY BYPASS GRAFT  2009   ERCP N/A 12/21/2018   Procedure: ENDOSCOPIC RETROGRADE CHOLANGIOPANCREATOGRAPHY (ERCP);  Surgeon: Rollin Dover, MD;  Location: [facility] ENDOSCOPY;  Service: Endoscopy;  Laterality: N/A;   ESOPHAGOGASTRODUODENOSCOPY (EGD) WITH PROPOFOL  N/A 12/17/2018   Procedure: ESOPHAGOGASTRODUODENOSCOPY (EGD) WITH PROPOFOL ;  Surgeon: Rollin Dover, MD;  Location: [facility] ENDOSCOPY;  Service: Endoscopy;  Laterality: N/A;   EUS Left 12/17/2018   Procedure: UPPER ENDOSCOPIC ULTRASOUND (EUS) LINEAR;  Surgeon: Rollin Dover, MD;  Location: [facility] ENDOSCOPY;  Service: Endoscopy;  Laterality: Left;   EYE SURGERY     for bleeding in eye   HERNIA REPAIR     LEFT HEART CATH AND CORONARY ANGIOGRAPHY N/A 11/04/2016   Procedure: Left Heart Cath and Coronary Angiography;  Surgeon: Gordy Bergamo, MD;  Location: [facility] INVASIVE CV LAB;  Service: Cardiovascular;  Laterality: N/A;   LOWER EXTREMITY ANGIOGRAPHY N/A 11/04/2016   Procedure: Lower Extremity Angiography;  Surgeon: Gordy Bergamo, MD;  Location: [facility] INVASIVE CV LAB;  Service: Cardiovascular;  Laterality: N/A;   LOWER EXTREMITY ANGIOGRAPHY N/A 01/03/2020   Procedure: LOWER EXTREMITY ANGIOGRAPHY;  Surgeon: Bergamo Gordy, MD;  Location: [facility] INVASIVE CV  LAB;  Service: Cardiovascular;  Laterality: N/A;   LOWER EXTREMITY ANGIOGRAPHY Bilateral 01/14/2022   Procedure: Lower Extremity Angiography;  Surgeon: Ladona Heinz, MD;  Location: [facility] INVASIVE CV LAB;  Service: Cardiovascular;  Laterality: Bilateral;   LUMBAR LAMINECTOMY/DECOMPRESSION MICRODISCECTOMY Bilateral 11/09/2020   Procedure: Laminectomy and Foraminotomy - bilateral - Lumbar Four-Five.;  Surgeon: Louis Shove, MD;  Location: [facility] OR;  Service: Neurosurgery;  Laterality: Bilateral;  posterior   PROSTATE SURGERY     REMOVAL OF STONES  12/21/2018   Procedure: REMOVAL OF STONES;  Surgeon: Rollin Dover, MD;  Location: [facility] ENDOSCOPY;   Service: Endoscopy;;   SPHINCTEROTOMY  12/21/2018   Procedure: ANNETT;  Surgeon: Rollin Dover, MD;  Location: [facility] ENDOSCOPY;  Service: Endoscopy;;   Patient Active Problem List   Diagnosis Date Noted   Osteomyelitis of great toe of left foot (HCC) 06/20/2023   Gangrene of toe of left foot (HCC) 06/19/2023   Normocytic anemia 06/19/2023   Eschar of left great toe 05/22/2023   Near syncope 06/15/2021   Dizziness 06/15/2021   Lumbar stenosis with neurogenic claudication 11/09/2020   CKD stage 3 due to type 2 diabetes mellitus (HCC) 12/14/2019   Asymptomatic bilateral carotid artery stenosis 12/14/2019   Abdominal distention    CAD s/p CABG in 2009 12/14/2018   Acute renal failure superimposed on stage 3 chronic kidney disease (HCC) 12/14/2018   UTI (urinary tract infection) 12/14/2018   HLD (hyperlipidemia) 12/14/2018   Sepsis (HCC) 12/14/2018   Abnormal LFTs    S/P CABG (coronary artery bypass graft) 12/06/2018   Asymptomatic stenosis of right carotid artery 12/06/2018   Claudication in peripheral vascular disease (HCC) 12/06/2018   Orthostatic hypotension due to diabetic dysautonomia 12/06/2018   Elevated liver enzymes 12/06/2018   Abdominal pain 10/12/2018   Abnormal liver function tests 10/12/2018   Foreign body in stomach 10/12/2018   Primary hypertension    GERD (gastroesophageal reflux disease)    Diabetes mellitus without complication (HCC)    Atherosclerosis of native coronary artery of native heart without angina pectoris     PCP: Caleen Dirks, MD   REFERRING PROVIDER: Harden Jerona GAILS, MD  ONSET DATE: 09/14/2023 Prosthesis delivery  REFERRING DIAG: S10.487 (ICD-10-CM) - Left below-knee amputee   THERAPY DIAG:  Other abnormalities of gait and mobility  Unsteadiness on feet  Muscle weakness (generalized)  Impaired functional mobility, balance, gait, and endurance  Rationale for Evaluation and Treatment: Rehabilitation  SUBJECTIVE:   SUBJECTIVE  STATEMENT: He denies pain today or any issues with prosthesis. He has not been sitting in wheelchair while prosthesis is on so that he will get up and walk more vs using wheelchair.  PERTINENT HISTORY: Left TTA 06/24/23, DM2, retinopathy, GERD, HLD, HTN, CAD, CABG 2009, PAD, prostate CA, CKD stage 3b, arthritis, Laminectomy & Foraminotomy bilateral L4-5,   PAIN:  Are you having pain? Yes,  4/10 pain in left leg.  PRECAUTIONS: Fall  WEIGHT BEARING RESTRICTIONS: No  FALLS: Has patient fallen in last 6 months? Yes. Number of falls 1 no injuries  LIVING ENVIRONMENT: Lives with: lives with their family Lives in: House Home Access: Ramped entrance and single step Home layout: Two level, 1/2 bath on main level, and bedroom Stairs: Yes: Internal: 14 steps; on left going up and can reach both and External: 1+1 steps; none Has following equipment at home: Quad cane small base, Environmental Consultant - 2 wheeled, Wheelchair (manual), shower chair, Shower bench, and bed side commode  OCCUPATION: retired from naval architect  PLOF: Independent  used cane & walker   PATIENT GOALS: to use prosthesis to walk in home & community, family wants to be safe,  go upstairs to shower  OBJECTIVE:   POSTURE: Evaluation on 09/16/2023:  rounded shoulders, forward head, flexed trunk , and weight shift right  LOWER EXTREMITY ROM:  ROM P:passive  A:active Right 09/24/23 Left 09/24/23  Hip flexion    Hip extension Debby -5* Thomas -11*  Hip abduction    Hip adduction    Hip internal rotation    Hip external rotation    Knee flexion    Knee extension  Supine P: -7*  Ankle dorsiflexion    Ankle plantarflexion    Ankle inversion    Ankle eversion     (Blank rows = not tested)  LOWER EXTREMITY MMT:  MMT Left 09/24/23  Hip flexion   Hip extension Sidelying 3-/5  Hip abduction Sidleying 3/5  Hip adduction   Hip internal rotation   Hip external rotation   Knee flexion   Knee extension 3-/5  Ankle  dorsiflexion   Ankle plantarflexion   Ankle inversion   Ankle eversion   At Evaluation all strength testing is grossly seated and functionally standing / gait. (Blank rows = not tested)  TRANSFERS: Evaluation on 09/16/2023:  Sit to stand: SBA 20 w/c with armrest to RW limited use of LLE Stand to sit: SBA RW to 20 w/c with armrest limited use of LLE  FUNCTIONAL TESTs:  09/24/2023:  Patient able to stand up from 20 w/c with armrest with RW for stabilization with supervision.  Patient able to maintain upright with RW support without assistance.  Patient able to balance without upper extremity support with minA for 60 seconds.  Deferred at evaluation on 09/16/2023 as limb too edematous to don prosthesis  GAIT: 09/24/2023:  Patient ambulated 50 feet with RW with CGA.  Initially patient walking with a step to pattern with minimal weight shift onto prosthesis.   Deferred at evaluation on 09/16/2023 as limb too edematous to don prosthesis Gait pattern:  Distance walked:  Assistive device utilized:  Level of assistance:  Comments:   CURRENT PROSTHETIC WEAR ASSESSMENT: Evaluation on 09/16/2023:  Patient is dependent with: skin check, residual limb care, care of non-amputated limb, prosthetic cleaning, ply sock cleaning, correct ply sock adjustment, proper wear schedule/adjustment, and proper weight-bearing schedule/adjustment Donning prosthesis: Max A Doffing prosthesis: Min A Prosthetic wear tolerance: 30-45 min 2 days ago when prosthesis delivered.  Prosthetic weight bearing tolerance: 3 minutes partial weight bearing trying to engage pin suspension.  Pt reports distal lateral limb pain from wear 2 days ago.  Edema: pitting edema  Residual limb condition: scab dry over tibial crest 5cm wide X 15 cm long.  Dry skin, normal color & temperature. Cylindrical shape Prosthetic description: silicon liner with anterior portion 9mm thick, secondary pelite foam liner, total contact socket,      TODAY'S TREATMENT:  DATE:  11/05/2023: Prosthetic Training with left Transtibial Amputation: PT reviewed adjusting ply socks as his tibia pain may be from too few socks.   Hamstring stretching 30 sec X 2 bilat.  Pt amb 200' with cane working on scanning maintaining path/pace and scanning enviornment PT instructed in negotiating curb with cane technique maintaining momentum.  Pt neg 6.5 curb with cane 2 reps with CGA Pt negotiated ramp  X 2, min A first attempt, CGA 2nd attempt Pt neg 11 stairs with step-to pattern with single rail & cane stand alone tip with MinA/CGA and PT cues on technique.  Pt ambulated with cane and picked up bottle of hand sanitizer from the floor X 5 reps, PT provided demo and cues for technique and he was able to perform CGA except one episode of min A. Pt ambulated without AD 10 feet turned 180 degrees then ambulated another 10 feet back to chair with CGA. This was progressed to holding simulated plate of foot and water  bottle so his hands were occupied and he was able to repeat this again CGA overall. Step ups onto airex pad without UE support min-mod A, held balance 30 seconds feet apart on pad then stepped down. Performed one rep each of this leading Rt leg and left leg.  11/03/2023: Prosthetic Training with left Transtibial Amputation: Wound is 3mm X 10mm superficial scab.  PT reviewed adjusting ply socks as his tibia pain may be from too few socks.   PT demo & verbal cues on hamstring set seated 10 reps, standing 10 reps and stance in gait 10 steps.  Pt able to return demo understanding including recommendation for all 3 positions 10 reps 3x/day or if tibia pain increases.  Pt amb 400' with cane working on scanning maintaining path/pace and alternating eyes open 3 steps / eyes closed 3 steps to simulate walking in dark.  All with minA/CGA with  tactile & verbal cues.  Back pain limited distance.  PT instructed in negotiating ramp & curb with cane technique maintaining momentum.  Pt neg 6.5 curb with cane 3 reps with modA 1st rep and minA 2nd/3rd rep. PT educated how to use nose of car on left side to practice with family.  Pt neg 12* ramp with cane with minA 3 reps.  Pt neg 11 stairs with step-to pattern with single rail & cane stand alone tip with MinA/CGA and PT cues on technique.  PT verbally educated on entering / exiting shower and kneel on residual limb to clean buttocks. Pt & dtr verbalized understanding.      TREATMENT:                                                                                                                             DATE:  10/28/2023: Prosthetic Training with left Transtibial Amputation: PT provided education to not sit in wheelchair when he is wearing prosthesis to encourage him to get up and walk around his home  vs using his wheelchair for ambulation. Also when he is not wearing prosthesis he needs to be in wheelchair or have it beside bed to remind him to not get up and walk since he would be missing the prosthesis.  Pt ambulated 150 feet with stand alone tip cane with CGA Pt ambulated with quad tip cane and weaving through cones 15 feet  X4 CGA with this Pt ambulated with quad tip cane and stepped over 4 cuff weights (2 and 2.5 lbs) stepping over with left leg first going down and right leg first coming back for 2 rounds and CGA with this Pt navigated 6.5 inch curb up/down X 2 reps with quad tip cane and CGA with verbal instructions for technique.  Pt ambulated 20 feet X 2 with cane and head turns and CGA Pt ambulate 20 feet X 2 with cane and head nods and CGA Sidestepping at counter top no UE support 3 round trips Backward walking at counter top with one UE support to end then returning forward walking without UE support. 3 round trips of this Pt ambulated 20 feet without AD, CGA with  this.  10/26/2023: Prosthetic Training with left Transtibial Amputation: PT checked scab on on residual limb which continues to heal, but not completely gone so recommended to continue to wear vivewear sock for wound healing and to also recommended to increase wear time to 6 hours 2 times per day to continue to build up tissue tolerance and wear tolerance Pt ambulated 150 feet with stand alone tip cane with CGA Pt ambulated with quad tip cane and weaving through cones 15 feet  X2 then around stool, had seated rest break then continued for another round of this. CGA with this Pt ambulated with quad tip cane and stepped over 4 cuff weights (2 and 2.5 lbs) stepping over with left leg first going down and right leg first coming back, had seated rest break then performed one more time. Min A with this Pt ambulated 100 feet with RW mod I out to elevator and rode down to first floor then performed 6 steps with handrail and cane reciprocal pattern to ascend and step to patten to descend with CGA.     PATIENT EDUCATION: PATIENT EDUCATED ON FOLLOWING PROSTHETIC CARE: Education details:   Skin check, Residual limb care, Prosthetic cleaning, Correct ply sock adjustment, Propper donning, and Proper wear schedule/adjustment Prosthetic wear tolerance: liner only initially 2 hours 3x/day, 7 days/week Person educated: Patient and Child(ren) Education method: Explanation, Demonstration, Tactile cues, and Verbal cues Education comprehension: dtr & granddaughter verbalized understanding, verbal cues required, tactile cues required, and needs further education  HOME EXERCISE PROGRAM:  ASSESSMENT:  CLINICAL IMPRESSION: He is overall progressing very well so worked on more dynamic gait challenges of walking without AD, as well as walking without AD carrying simulated food in one hand and bottle of water  in other hand. He was also able to pick up objects from floor today but did have one episode of needing min A for  balance posteriorly with this so recommend to continue to practice this in clinic.  OBJECTIVE IMPAIRMENTS: Abnormal gait, decreased activity tolerance, decreased balance, decreased endurance, decreased knowledge of condition, decreased knowledge of use of DME, decreased mobility, difficulty walking, decreased ROM, decreased strength, increased edema, prosthetic dependency , and pain.   ACTIVITY LIMITATIONS: carrying, lifting, sitting, standing, stairs, transfers, and locomotion level  PARTICIPATION LIMITATIONS: standing ADLS, cleaning, community activity, and household activity  PERSONAL FACTORS: Age, Fitness, Past/current experiences,  Time since onset of injury/illness/exacerbation, and 3+ comorbidities: see PMH  are also affecting patient's functional outcome.   REHAB POTENTIAL: Good  CLINICAL DECISION MAKING: Evolving/moderate complexity  EVALUATION COMPLEXITY: Moderate   GOALS: Goals reviewed with patient? Yes  SHORT TERM GOALS: Target date: 10/15/2023  Patient donnes prosthesis modified independent & family verbalizes proper cleaning. Baseline: SEE OBJECTIVE DATA Goal status: MET 10/07/2023 2.  Patient tolerates prosthesis >8 hrs total /day without skin issues increasing or limb pain >4/10 after standing. Baseline: SEE OBJECTIVE DATA Goal status: MET 10/07/2023  3.  Patient able to reach 7 and look over both shoulders with RW support with supervision. Baseline: SEE OBJECTIVE DATA Goal status: MET 10/14/2023  4. Patient ambulates 34' with RW & prosthesis with supervision. Baseline: SEE OBJECTIVE DATA Goal status: MET 10/07/2023  5. Patient negotiates ramps & curbs with RW & prosthesis with minA. Baseline: SEE OBJECTIVE DATA Goal status: MET 10/07/2023  LONG TERM GOALS: Target date: 12/10/2023  Patient & family demonstrate & verbalize understanding of prosthetic care to enable safe utilization of prosthesis. Baseline: SEE OBJECTIVE DATA Goal status: Ongoing 10/07/2023  Patient  tolerates prosthesis wear >90% of awake hours without skin or limb pain issues. Baseline: SEE OBJECTIVE DATA Goal status: Ongoing 10/07/2023  Task of [employer] with RW support >/= 30/56 to indicate lower fall risk with standing ADLs. Baseline: SEE OBJECTIVE DATA Goal status: Ongoing 10/07/2023  Patient ambulates 76' modified independent &  >200' family supervision with prosthesis & RW. Baseline: SEE OBJECTIVE DATA Goal status: Ongoing 10/07/2023  Patient negotiates ramps, curbs & stairs with single rail with prosthesis & RW with family assistance safely.  Baseline: SEE OBJECTIVE DATA Goal status: Ongoing 10/07/2023   PLAN:  PT FREQUENCY: 2x/week  PT DURATION: 12 weeks  PLANNED INTERVENTIONS: 97164- PT Re-evaluation, 97110-Therapeutic exercises, 97530- Therapeutic activity, 97112- Neuromuscular re-education, 97535- Self Care, [id number]- Gait training, [phone]- Prosthetic training, Patient/Family education, Balance training, Stair training, DME instructions, and physical performance testing  PLAN FOR NEXT SESSION:    Check wound on residual limb and progress wear as indicated,  Prosthetic gait with cane including scanning and advanced balance in gait.   Redell JONELLE Moose, PT, DPT 11/05/2023, 4:31 PM  Date of referral: 08/31/2023 Referring provider: Jerona Sage, MD Referring diagnosis? S10.487 (ICD-10-CM) - Left below-knee amputee  Treatment diagnosis? (if different than referring diagnosis)  Other abnormalities of gait and mobility  ICD-10-CM: R26.89   Unsteadiness on feet  ICD-10-CM: R26.81   Muscle weakness (generalized)  ICD-10-CM: M62.81   Impaired functional mobility, balance, gait, and endurance  ICD-10-CM: Z74.09   What was this (referring dx) caused by? Surgery (Type: left BKA)  Nature of Condition: Initial Onset (within last 3 months) prosthesis delivery    Laterality: Lt  Current Functional Measure Score: Other standing balance supervision static stance 1 min.   Objective  measurements identify impairments when they are compared to normal values, the uninvolved extremity, and prior level of function.  [x]  Yes  []  No  Objective assessment of functional ability: Moderate functional limitations   Briefly describe symptoms: patient is dependent in prosthetic care & use.  He has impaired mobility. Patient is deconditioned from prolonged limited activity with wound, then amputation.   How did symptoms start: infected wound led to amputation  Average pain intensity:  Last 24 hours: 0  Past week: 0  How often does the pt experience symptoms? Constantly  How much have the symptoms interfered with usual daily activities? Extremely  How has condition  changed since care began at this facility? NA - initial visit  In general, how is the patients overall health? Good   BACK PAIN (STarT Back Screening Tool) No

## 2023-11-09 ENCOUNTER — Encounter: Payer: Self-pay | Admitting: Physical Therapy

## 2023-11-09 ENCOUNTER — Ambulatory Visit: Payer: Medicare Other | Admitting: Physical Therapy

## 2023-11-09 DIAGNOSIS — R2689 Other abnormalities of gait and mobility: Secondary | ICD-10-CM

## 2023-11-09 DIAGNOSIS — R2681 Unsteadiness on feet: Secondary | ICD-10-CM | POA: Diagnosis not present

## 2023-11-09 DIAGNOSIS — M6281 Muscle weakness (generalized): Secondary | ICD-10-CM | POA: Diagnosis not present

## 2023-11-09 DIAGNOSIS — R42 Dizziness and giddiness: Secondary | ICD-10-CM

## 2023-11-09 DIAGNOSIS — Z7409 Other reduced mobility: Secondary | ICD-10-CM

## 2023-11-09 NOTE — Therapy (Addendum)
 OUTPATIENT PHYSICAL THERAPY PROSTHETIC TREATMENT AND PROGRESS NOTE   Patient Name: Justin [name] MRN: [id number] DOB:04-Oct-1932, 89 y.o., male Today's Date: 11/09/2023  Progress Note Reporting Period 09/15/2024 to 11/09/2023  See note below for Objective Data and Assessment of Progress/Goals.     END OF SESSION:  PT End of Session - 11/09/23 1510     Visit Number 10    Number of Visits 25    Date for PT Re-Evaluation 12/10/23    Authorization Type [employer] Medicare    Authorization Time Period $20 co-pay    Progress Note Due on Visit 20    PT Start Time 1512    PT Stop Time 1600    PT Time Calculation (min) 48 min    Equipment Utilized During Treatment Gait belt    Activity Tolerance Patient tolerated treatment well    Behavior During Therapy WFL for tasks assessed/performed                  Past Medical History:  Diagnosis Date   Anxiety    Arthritis    Carotid arterial disease (HCC)    CKD (chronic kidney disease)    Coronary artery disease    Diabetes mellitus without complication (HCC)    Diabetic retinopathy (HCC)    NPDR OU   Diverticulitis    Dyspnea    GERD (gastroesophageal reflux disease)    History of kidney stones 2021   Hypercholesteremia    Hypertension    Hypertensive retinopathy    OU   Myocardial infarction [facility]) 2009   Pneumonia    as a child   Prostate cancer (HCC)    UTI (lower urinary tract infection)    Past Surgical History:  Procedure Laterality Date   ABDOMINAL AORTOGRAM W/LOWER EXTREMITY N/A 06/22/2023   Procedure: ABDOMINAL AORTOGRAM W/LOWER EXTREMITY;  Surgeon: Adine Hoof, MD;  Location: [facility] INVASIVE CV LAB;  Service: Cardiovascular;  Laterality: N/A;   ABDOMINAL SURGERY     for diverticulitis; also removed appendix   AMPUTATION Left 06/24/2023   Procedure: LEFT BELOW THE KNEE AMPUTATION;  Surgeon: Timothy Ford, MD;  Location: [facility] OR;  Service: Orthopedics;  Laterality: Left;   APPENDECTOMY     CARDIAC  CATHETERIZATION  2018   CATARACT EXTRACTION     CATARACT EXTRACTION, BILATERAL     CHOLECYSTECTOMY N/A 10/12/2017   Procedure: LAPAROSCOPIC CHOLECYSTECTOMY;  Surgeon: Oza Blumenthal, MD;  Location: [facility] OR;  Service: General;  Laterality: N/A;   CORONARY ARTERY BYPASS GRAFT  2009   ERCP N/A 12/21/2018   Procedure: ENDOSCOPIC RETROGRADE CHOLANGIOPANCREATOGRAPHY (ERCP);  Surgeon: Alvis Jourdain, MD;  Location: [facility] ENDOSCOPY;  Service: Endoscopy;  Laterality: N/A;   ESOPHAGOGASTRODUODENOSCOPY (EGD) WITH PROPOFOL  N/A 12/17/2018   Procedure: ESOPHAGOGASTRODUODENOSCOPY (EGD) WITH PROPOFOL ;  Surgeon: Alvis Jourdain, MD;  Location: [facility] ENDOSCOPY;  Service: Endoscopy;  Laterality: N/A;   EUS Left 12/17/2018   Procedure: UPPER ENDOSCOPIC ULTRASOUND (EUS) LINEAR;  Surgeon: Alvis Jourdain, MD;  Location: [facility] ENDOSCOPY;  Service: Endoscopy;  Laterality: Left;   EYE SURGERY     "for bleeding in eye"   HERNIA REPAIR     LEFT HEART CATH AND CORONARY ANGIOGRAPHY N/A 11/04/2016   Procedure: Left Heart Cath and Coronary Angiography;  Surgeon: Knox Perl, MD;  Location: [facility] INVASIVE CV LAB;  Service: Cardiovascular;  Laterality: N/A;   LOWER EXTREMITY ANGIOGRAPHY N/A 11/04/2016   Procedure: Lower Extremity Angiography;  Surgeon: Knox Perl, MD;  Location: [facility] INVASIVE CV LAB;  Service: Cardiovascular;  Laterality: N/A;   LOWER EXTREMITY ANGIOGRAPHY N/A 01/03/2020   Procedure: LOWER EXTREMITY ANGIOGRAPHY;  Surgeon: Knox Perl, MD;  Location: [facility] INVASIVE CV LAB;  Service: Cardiovascular;  Laterality: N/A;   LOWER EXTREMITY ANGIOGRAPHY Bilateral 01/14/2022   Procedure: Lower Extremity Angiography;  Surgeon: Knox Perl, MD;  Location: [facility] INVASIVE CV LAB;  Service: Cardiovascular;  Laterality: Bilateral;   LUMBAR LAMINECTOMY/DECOMPRESSION MICRODISCECTOMY Bilateral 11/09/2020   Procedure: Laminectomy and Foraminotomy - bilateral - Lumbar Four-Five.;  Surgeon: Agustina Aldrich, MD;  Location: [facility] OR;  Service: Neurosurgery;  Laterality:  Bilateral;  posterior   PROSTATE SURGERY     REMOVAL OF STONES  12/21/2018   Procedure: REMOVAL OF STONES;  Surgeon: Alvis Jourdain, MD;  Location: [facility] ENDOSCOPY;  Service: Endoscopy;;   SPHINCTEROTOMY  12/21/2018   Procedure: [address];  Surgeon: Alvis Jourdain, MD;  Location: [facility] ENDOSCOPY;  Service: Endoscopy;;   Patient Active Problem List   Diagnosis Date Noted   Osteomyelitis of great toe of left foot (HCC) 06/20/2023   Gangrene of toe of left foot (HCC) 06/19/2023   Normocytic anemia 06/19/2023   Eschar of left great toe 05/22/2023   Near syncope 06/15/2021   Dizziness 06/15/2021   Lumbar stenosis with neurogenic claudication 11/09/2020   CKD stage 3 due to type 2 diabetes mellitus (HCC) 12/14/2019   Asymptomatic bilateral carotid artery stenosis 12/14/2019   Abdominal distention    CAD s/p CABG in 2009 12/14/2018   Acute renal failure superimposed on stage 3 chronic kidney disease (HCC) 12/14/2018   UTI (urinary tract infection) 12/14/2018   HLD (hyperlipidemia) 12/14/2018   Sepsis (HCC) 12/14/2018   Abnormal LFTs    S/P CABG (coronary artery bypass graft) 12/06/2018   Asymptomatic stenosis of right carotid artery 12/06/2018   Claudication in peripheral vascular disease (HCC) 12/06/2018   Orthostatic hypotension due to diabetic dysautonomia 12/06/2018   Elevated liver enzymes 12/06/2018   Abdominal pain 10/12/2018   Abnormal liver function tests 10/12/2018   Foreign body in stomach 10/12/2018   Primary hypertension    GERD (gastroesophageal reflux disease)    Diabetes mellitus without complication (HCC)    Atherosclerosis of native coronary artery of native heart without angina pectoris     PCP: Tita Form, MD   REFERRING PROVIDER: Timothy Ford, MD  ONSET DATE: 09/14/2023 Prosthesis delivery  REFERRING DIAG: O96.295 (ICD-10-CM) - Left below-knee amputee   THERAPY DIAG:  Other abnormalities of gait and mobility  Unsteadiness on feet  Muscle weakness  (generalized)  Dizziness and giddiness  Impaired functional mobility, balance, gait, and endurance  Rationale for Evaluation and Treatment: Rehabilitation  SUBJECTIVE:   SUBJECTIVE STATEMENT: Patient has been able to wear the prosthesis without issue, has been wearing the prosthesis about 5-6hrs with about a 1 hr break. States that wound has been heeling well and has not been giving any issue.   PERTINENT HISTORY: Left TTA 06/24/23, DM2, retinopathy, GERD, HLD, HTN, CAD, CABG 2009, PAD, prostate CA, CKD stage 3b, arthritis, Laminectomy & Foraminotomy bilateral L4-5,   PAIN:  Are you having pain? No, 0/10 pain.  PRECAUTIONS: Fall  WEIGHT BEARING RESTRICTIONS: No  FALLS: Has patient fallen in last 6 months? Yes. Number of falls 1 no injuries  LIVING ENVIRONMENT: Lives with: lives with their family Lives in: House Home Access: Ramped entrance and single step Home layout: Two level, 1/2 bath on main level, and bedroom Stairs: Yes: Internal: 14 steps; on left going up and can reach both and External: 1+1 steps; none Has  following equipment at home: Counselling psychologist, Environmental consultant - 2 wheeled, Wheelchair (manual), shower chair, Shower bench, and bed side commode  OCCUPATION: retired from [employer]  PLOF: Independent used cane & walker   PATIENT GOALS: to use prosthesis to walk in home & community, family wants to be safe,  go upstairs to shower  OBJECTIVE:   POSTURE: Evaluation on 09/16/2023:  rounded shoulders, forward head, flexed trunk , and weight shift right  LOWER EXTREMITY ROM:  ROM P:passive  A:active Right 09/24/23 Left 09/24/23  Hip flexion    Hip extension Andy Bannister -5* Thomas -11*  Hip abduction    Hip adduction    Hip internal rotation    Hip external rotation    Knee flexion    Knee extension  Supine P: -7*  Ankle dorsiflexion    Ankle plantarflexion    Ankle inversion    Ankle eversion     (Blank rows = not tested)  LOWER EXTREMITY MMT:  MMT  Left 09/24/23  Hip flexion   Hip extension Sidelying 3-/5  Hip abduction Sidleying 3/5  Hip adduction   Hip internal rotation   Hip external rotation   Knee flexion   Knee extension 3-/5  Ankle dorsiflexion   Ankle plantarflexion   Ankle inversion   Ankle eversion   At Evaluation all strength testing is grossly seated and functionally standing / gait. (Blank rows = not tested)  TRANSFERS: Evaluation on 09/16/2023:  Sit to stand: SBA 20" w/c with armrest to RW limited use of LLE Stand to sit: SBA RW to 20" w/c with armrest limited use of LLE  FUNCTIONAL TESTs:  11/09/2023 Randye Buttner: 35/56   [facility] PT Assessment - 11/09/23 1514       Standardized Balance Assessment   Standardized Balance Assessment Berg Balance Test      Berg Balance Test   Sit to Stand Able to stand  independently using hands    Standing Unsupported Able to stand safely 2 minutes    Sitting with Back Unsupported but Feet Supported on Floor or Stool Able to sit safely and securely 2 minutes    Stand to Sit Sits safely with minimal use of hands    Transfers Able to transfer safely, definite need of hands    Standing Unsupported with Eyes Closed Able to stand 10 seconds with supervision    Standing Unsupported with Feet Together Needs help to attain position but able to stand for 30 seconds with feet together    From Standing, Reach Forward with Outstretched Arm Can reach confidently >25 cm (10")    From Standing Position, Pick up Object from Floor Able to pick up shoe safely and easily    From Standing Position, Turn to Look Behind Over each Shoulder Looks behind one side only/other side shows less weight shift    Turn 360 Degrees Needs close supervision or verbal cueing    Standing Unsupported, Alternately Place Feet on Step/Stool Needs assistance to keep from falling or unable to try    Standing Unsupported, One Foot in Front Needs help to step but can hold 15 seconds    Standing on One Leg Unable to try or  needs assist to prevent fall    Total Score 35    Berg comment: BERG  < 36 high risk for falls (close to 100%) 46-51 moderate (>50%)   37-45 significant (>80%) 52-55 lower (> 25%)             TUG trial 1:  38sec 2 stumbles with pt able to correct without therapist intervention, trial 2: 34sec no stumbles, MinA to CGA  09/24/2023:  Patient able to stand up from 20" w/c with armrest with RW for stabilization with supervision.  Patient able to maintain upright with RW support without assistance.  Patient able to balance without upper extremity support with minA for 60 seconds.  Deferred at evaluation on 09/16/2023 as limb too edematous to don prosthesis  GAIT: 11/09/2023:  Self selected gait speed: 1.46 ft/sec with stand alone cane, MinA  09/24/2023:   Patient ambulated 50 feet with RW with CGA.  Initially patient walking with a step to pattern with minimal weight shift onto prosthesis.   Deferred at evaluation on 09/16/2023 as limb too edematous to don prosthesis Gait pattern:  Distance walked:  Assistive device utilized:  Level of assistance:  Comments:   CURRENT PROSTHETIC WEAR ASSESSMENT:  Evaluation on 09/16/2023:  Patient is dependent with: skin check, residual limb care, care of non-amputated limb, prosthetic cleaning, ply sock cleaning, correct ply sock adjustment, proper wear schedule/adjustment, and proper weight-bearing schedule/adjustment Donning prosthesis: Max A Doffing prosthesis: Min A Prosthetic wear tolerance: 30-45 min 2 days ago when prosthesis delivered.  Prosthetic weight bearing tolerance: 3 minutes partial weight bearing trying to engage pin suspension.  Pt reports distal lateral limb pain from wear 2 days ago.  Edema: pitting edema  Residual limb condition: scab dry over tibial crest 5cm wide X 15 cm long.  Dry skin, normal color & temperature. Cylindrical shape Prosthetic description: silicon liner with anterior portion 9mm thick, secondary pelite foam  liner, total contact socket,     TODAY'S TREATMENT:                                                                                                                             DATE:  11/09/2023: Prosthetic Training with left Transtibial Amputation: -Instructed on wearing all awake hours and checking every 5hrs for skin issues or Vivewear under liner dampness; then putting it right back on.  -Patient negotiated 2 flights (11 steps ea) of stairs ascending and descending with use of 1 railing + cane changing railing hand for the second flight. Patient required cuing for proper sequencing, CGA to SBA for patient safety. -Ambulation with SPC CGA with scanning L/R while maintaining path & pace. Instances of hesitancy with patient able to continue walking without therapist intervention.       Physical Performance Testing:  See objective data for Berg, TUG and gait velocity.     TREATMENT:  DATE:  11/05/2023: Prosthetic Training with left Transtibial Amputation: PT reviewed adjusting ply socks as his tibia pain may be from too few socks.   Hamstring stretching 30 sec X 2 bilat.  Pt amb 200' with cane working on scanning maintaining path/pace and scanning enviornment PT instructed in negotiating curb with cane technique maintaining momentum.  Pt neg 6.5" curb with cane 2 reps with CGA Pt negotiated ramp  X 2, min A first attempt, CGA 2nd attempt Pt neg 11 stairs with step-to pattern with single rail & cane stand alone tip with MinA/CGA and PT cues on technique.  Pt ambulated with cane and picked up bottle of hand sanitizer from the floor X 5 reps, PT provided demo and cues for technique and he was able to perform CGA except one episode of min A. Pt ambulated without AD 10 feet turned 180 degrees then ambulated another 10 feet back to chair with CGA. This was progressed to holding  simulated plate of foot and water  bottle so his hands were occupied and he was able to repeat this again CGA overall. Step ups onto airex pad without UE support min-mod A, held balance 30 seconds feet apart on pad then stepped down. Performed one rep each of this leading Rt leg and left leg.  11/03/2023: Prosthetic Training with left Transtibial Amputation: Wound is 3mm X 10mm superficial scab.  PT reviewed adjusting ply socks as his tibia pain may be from too few socks.   PT demo & verbal cues on hamstring set seated 10 reps, standing 10 reps and stance in gait 10 steps.  Pt able to return demo understanding including recommendation for all 3 positions 10 reps 3x/day or if tibia pain increases.  Pt amb 400' with cane working on scanning maintaining path/pace and alternating eyes open 3 steps / eyes closed 3 steps to simulate walking in dark.  All with minA/CGA with tactile & verbal cues.  Back pain limited distance.  PT instructed in negotiating ramp & curb with cane technique maintaining momentum.  Pt neg 6.5" curb with cane 3 reps with modA 1st rep and minA 2nd/3rd rep. PT educated how to use nose of car on left side to practice with family.  Pt neg 12* ramp with cane with minA 3 reps.  Pt neg 11 stairs with step-to pattern with single rail & cane stand alone tip with MinA/CGA and PT cues on technique.  PT verbally educated on entering / exiting shower and kneel on residual limb to clean buttocks. Pt & dtr verbalized understanding.      TREATMENT:                                                                                                                             DATE:  10/28/2023: Prosthetic Training with left Transtibial Amputation: PT provided education to not sit in wheelchair when he is wearing prosthesis to encourage him to get up and walk around his home  vs using his wheelchair for ambulation. Also when he is not wearing prosthesis he needs to be in wheelchair or have it beside bed to  remind him to not get up and walk since he would be missing the prosthesis.  Pt ambulated 150 feet with stand alone tip cane with CGA Pt ambulated with quad tip cane and weaving through cones 15 feet  X4 CGA with this Pt ambulated with quad tip cane and stepped over 4 cuff weights (2 and 2.5 lbs) stepping over with left leg first going down and right leg first coming back for 2 rounds and CGA with this Pt navigated 6.5 inch curb up/down X 2 reps with quad tip cane and CGA with verbal instructions for technique.  Pt ambulated 20 feet X 2 with cane and head turns and CGA Pt ambulate 20 feet X 2 with cane and head nods and CGA Sidestepping at counter top no UE support 3 round trips Backward walking at counter top with one UE support to end then returning forward walking without UE support. 3 round trips of this Pt ambulated 20 feet without AD, CGA with this.  10/26/2023: Prosthetic Training with left Transtibial Amputation: PT checked scab on on residual limb which continues to heal, but not completely gone so recommended to continue to wear vivewear sock for wound healing and to also recommended to increase wear time to 6 hours 2 times per day to continue to build up tissue tolerance and wear tolerance Pt ambulated 150 feet with stand alone tip cane with CGA Pt ambulated with quad tip cane and weaving through cones 15 feet  X2 then around stool, had seated rest break then continued for another round of this. CGA with this Pt ambulated with quad tip cane and stepped over 4 cuff weights (2 and 2.5 lbs) stepping over with left leg first going down and right leg first coming back, had seated rest break then performed one more time. Min A with this Pt ambulated 100 feet with RW mod I out to elevator and rode down to first floor then performed 6 steps with handrail and cane reciprocal pattern to ascend and step to patten to descend with CGA.     PATIENT EDUCATION: PATIENT EDUCATED ON FOLLOWING  PROSTHETIC CARE: Education details:   Skin check, Residual limb care, Prosthetic cleaning, Correct ply sock adjustment, Propper donning, and Proper wear schedule/adjustment Prosthetic wear tolerance: liner only initially 2 hours 3x/day, 7 days/week Person educated: Patient and Child(ren) Education method: Explanation, Demonstration, Tactile cues, and Verbal cues Education comprehension: dtr & granddaughter verbalized understanding, verbal cues required, tactile cues required, and needs further education  HOME EXERCISE PROGRAM:  ASSESSMENT:  CLINICAL IMPRESSION: Patient is tolerating increased prosthesis wear with wound healing which has improved his mobility and safety during his awake hours.  Patient demonstrated increased functional ability however is still a fall risk by scoring <45/56 on BERG. His balance has improved since PT evaluation.  Patient demonstrated increased safety and less assistance with stair negotiation with therapist able to decrease assist level as patient progressed. Still demonstrates deficits in ambulation, balance, and overall function and will benefit from continued skilled physical therapy.  OBJECTIVE IMPAIRMENTS: Abnormal gait, decreased activity tolerance, decreased balance, decreased endurance, decreased knowledge of condition, decreased knowledge of use of DME, decreased mobility, difficulty walking, decreased ROM, decreased strength, increased edema, prosthetic dependency , and pain.   ACTIVITY LIMITATIONS: carrying, lifting, sitting, standing, stairs, transfers, and locomotion level  PARTICIPATION LIMITATIONS: standing ADLS, cleaning,  community activity, and household activity  PERSONAL FACTORS: Age, Fitness, Past/current experiences, Time since onset of injury/illness/exacerbation, and 3+ comorbidities: see PMH  are also affecting patient's functional outcome.   REHAB POTENTIAL: Good  CLINICAL DECISION MAKING: Evolving/moderate complexity  EVALUATION  COMPLEXITY: Moderate   GOALS: Goals reviewed with patient? Yes  SHORT TERM GOALS: Target date: 10/15/2023  Patient donnes prosthesis modified independent & family verbalizes proper cleaning. Baseline: SEE OBJECTIVE DATA Goal status: MET 10/07/2023 2.  Patient tolerates prosthesis >8 hrs total /day without skin issues increasing or limb pain >4/10 after standing. Baseline: SEE OBJECTIVE DATA Goal status: MET 10/07/2023  3.  Patient able to reach 7" and look over both shoulders with RW support with supervision. Baseline: SEE OBJECTIVE DATA Goal status: MET 10/14/2023  4. Patient ambulates 21' with RW & prosthesis with supervision. Baseline: SEE OBJECTIVE DATA Goal status: MET 10/07/2023  5. Patient negotiates ramps & curbs with RW & prosthesis with minA. Baseline: SEE OBJECTIVE DATA Goal status: MET 10/07/2023  LONG TERM GOALS: Target date: 12/10/2023  Patient & family demonstrate & verbalize understanding of prosthetic care to enable safe utilization of prosthesis. Baseline: SEE OBJECTIVE DATA Goal status: Ongoing 10/07/2023  Patient tolerates prosthesis wear >90% of awake hours without skin or limb pain issues. Baseline: SEE OBJECTIVE DATA Goal status: Ongoing 10/07/2023  Task of [employer] with RW support >/= 30/56 to indicate lower fall risk with standing ADLs. Baseline: SEE OBJECTIVE DATA Goal status: Ongoing 10/07/2023  Patient ambulates 5' modified independent &  >200' family supervision with prosthesis & RW. Baseline: SEE OBJECTIVE DATA Goal status: Ongoing 10/07/2023  Patient negotiates ramps, curbs & stairs with single rail with prosthesis & RW with family assistance safely.  Baseline: SEE OBJECTIVE DATA Goal status: Ongoing 10/07/2023   PLAN:  PT FREQUENCY: 2x/week  PT DURATION: 12 weeks  PLANNED INTERVENTIONS: 97164- PT Re-evaluation, 97110-Therapeutic exercises, 97530- Therapeutic activity, 97112- Neuromuscular re-education, [phone]- Self Care, 19147- Gait training,  [phone]- Prosthetic training, Patient/Family education, Balance training, Stair training, DME instructions, and physical performance testing  PLAN FOR NEXT SESSION:   Object finding in gym- cones dispersed high and low with patient needing to locate/scan room for objects, continue curb negotiation. Do [employer] Medicare reauthorization.    Alette Kataoka, Tawnia Schirm, Student-PT 11/09/2023, 4:25 PM  This entire session of physical therapy was performed under the direct supervision of PT signing evaluation /treatment. PT reviewed note and agrees.   Lorie Rook, PT, DPT 11/09/2023, 4:37 PM   Date of referral: 08/31/2023 Referring provider: Gearldean Keepers, MD Referring diagnosis? O13.086 (ICD-10-CM) - Left below-knee amputee  Treatment diagnosis? (if different than referring diagnosis)  Other abnormalities of gait and mobility  ICD-10-CM: R26.89   Unsteadiness on feet  ICD-10-CM: R26.81   Muscle weakness (generalized)  ICD-10-CM: M62.81   Impaired functional mobility, balance, gait, and endurance  ICD-10-CM: Z74.09   What was this (referring dx) caused by? Surgery (Type: left BKA)  Nature of Condition: Initial Onset (within last 3 months) prosthesis delivery    Laterality: Lt  Current Functional Measure Score: Other standing balance supervision static stance 1 min.   Objective measurements identify impairments when they are compared to normal values, the uninvolved extremity, and prior level of function.  [x]  Yes  []  No  Objective assessment of functional ability: Moderate functional limitations   Briefly describe symptoms: patient is dependent in prosthetic care & use.  He has impaired mobility. Patient is deconditioned from prolonged limited activity with wound, then amputation.   How did  symptoms start: infected wound led to amputation  Average pain intensity:  Last 24 hours: 0  Past week: 0  How often does the pt experience symptoms? Constantly  How much have the symptoms interfered  with usual daily activities? Extremely  How has condition changed since care began at this facility? NA - initial visit  In general, how is the patients overall health? Good   BACK PAIN (STarT Back Screening Tool) No

## 2023-11-11 ENCOUNTER — Ambulatory Visit: Payer: Medicare Other | Admitting: Physical Therapy

## 2023-11-11 ENCOUNTER — Encounter: Payer: Self-pay | Admitting: Physical Therapy

## 2023-11-11 DIAGNOSIS — R2689 Other abnormalities of gait and mobility: Secondary | ICD-10-CM

## 2023-11-11 DIAGNOSIS — Z7409 Other reduced mobility: Secondary | ICD-10-CM

## 2023-11-11 DIAGNOSIS — R2681 Unsteadiness on feet: Secondary | ICD-10-CM | POA: Diagnosis not present

## 2023-11-11 DIAGNOSIS — M6281 Muscle weakness (generalized): Secondary | ICD-10-CM

## 2023-11-11 DIAGNOSIS — R42 Dizziness and giddiness: Secondary | ICD-10-CM

## 2023-11-11 NOTE — Therapy (Signed)
OUTPATIENT PHYSICAL THERAPY PROSTHETIC TREATMENT   Patient Name: Justin [name] MRN: [id number] DOB:1933/09/26, 89 y.o., male Today's Date: 11/11/2023  END OF SESSION:  PT End of Session - 11/11/23 1603     Visit Number 11    Number of Visits 25    Date for PT Re-Evaluation 12/10/23    Authorization Type [employer] Medicare    Authorization Time Period $20 co-pay, $20 COPAY 16 PT VISITS APPROVED 12/18-2/12/25    Authorization - Visit Number 11    Authorization - Number of Visits 16    Progress Note Due on Visit 20    PT Start Time 1600    PT Stop Time 1644    PT Time Calculation (min) 44 min    Equipment Utilized During Treatment Gait belt    Activity Tolerance Patient tolerated treatment well    Behavior During Therapy WFL for tasks assessed/performed                 $20 COPAY 16 PT VISITS APPROVED 12/18-2/12/25   Past Medical History:  Diagnosis Date   Anxiety    Arthritis    Carotid arterial disease (HCC)    CKD (chronic kidney disease)    Coronary artery disease    Diabetes mellitus without complication (HCC)    Diabetic retinopathy (HCC)    NPDR OU   Diverticulitis    Dyspnea    GERD (gastroesophageal reflux disease)    History of kidney stones 2021   Hypercholesteremia    Hypertension    Hypertensive retinopathy    OU   Myocardial infarction [facility]) 2009   Pneumonia    as a child   Prostate cancer (HCC)    UTI (lower urinary tract infection)    Past Surgical History:  Procedure Laterality Date   ABDOMINAL AORTOGRAM W/LOWER EXTREMITY N/A 06/22/2023   Procedure: ABDOMINAL AORTOGRAM W/LOWER EXTREMITY;  Surgeon: Maeola Harman, MD;  Location: [facility] INVASIVE CV LAB;  Service: Cardiovascular;  Laterality: N/A;   ABDOMINAL SURGERY     for diverticulitis; also removed appendix   AMPUTATION Left 06/24/2023   Procedure: LEFT BELOW THE KNEE AMPUTATION;  Surgeon: Nadara Mustard, MD;  Location: [facility] OR;  Service: Orthopedics;  Laterality: Left;    APPENDECTOMY     CARDIAC CATHETERIZATION  2018   CATARACT EXTRACTION     CATARACT EXTRACTION, BILATERAL     CHOLECYSTECTOMY N/A 10/12/2017   Procedure: LAPAROSCOPIC CHOLECYSTECTOMY;  Surgeon: Abigail Miyamoto, MD;  Location: [facility] OR;  Service: General;  Laterality: N/A;   CORONARY ARTERY BYPASS GRAFT  2009   ERCP N/A 12/21/2018   Procedure: ENDOSCOPIC RETROGRADE CHOLANGIOPANCREATOGRAPHY (ERCP);  Surgeon: Jeani Hawking, MD;  Location: [facility] ENDOSCOPY;  Service: Endoscopy;  Laterality: N/A;   ESOPHAGOGASTRODUODENOSCOPY (EGD) WITH PROPOFOL N/A 12/17/2018   Procedure: ESOPHAGOGASTRODUODENOSCOPY (EGD) WITH PROPOFOL;  Surgeon: Jeani Hawking, MD;  Location: [facility] ENDOSCOPY;  Service: Endoscopy;  Laterality: N/A;   EUS Left 12/17/2018   Procedure: UPPER ENDOSCOPIC ULTRASOUND (EUS) LINEAR;  Surgeon: Jeani Hawking, MD;  Location: [facility] ENDOSCOPY;  Service: Endoscopy;  Laterality: Left;   EYE SURGERY     "for bleeding in eye"   HERNIA REPAIR     LEFT HEART CATH AND CORONARY ANGIOGRAPHY N/A 11/04/2016   Procedure: Left Heart Cath and Coronary Angiography;  Surgeon: Yates Decamp, MD;  Location: [facility] INVASIVE CV LAB;  Service: Cardiovascular;  Laterality: N/A;   LOWER EXTREMITY ANGIOGRAPHY N/A 11/04/2016   Procedure: Lower Extremity Angiography;  Surgeon: Yates Decamp, MD;  Location:  [facility] INVASIVE CV LAB;  Service: Cardiovascular;  Laterality: N/A;   LOWER EXTREMITY ANGIOGRAPHY N/A 01/03/2020   Procedure: LOWER EXTREMITY ANGIOGRAPHY;  Surgeon: Yates Decamp, MD;  Location: [facility] INVASIVE CV LAB;  Service: Cardiovascular;  Laterality: N/A;   LOWER EXTREMITY ANGIOGRAPHY Bilateral 01/14/2022   Procedure: Lower Extremity Angiography;  Surgeon: Yates Decamp, MD;  Location: [facility] INVASIVE CV LAB;  Service: Cardiovascular;  Laterality: Bilateral;   LUMBAR LAMINECTOMY/DECOMPRESSION MICRODISCECTOMY Bilateral 11/09/2020   Procedure: Laminectomy and Foraminotomy - bilateral - Lumbar Four-Five.;  Surgeon: Julio Sicks, MD;  Location: [facility] OR;  Service:  Neurosurgery;  Laterality: Bilateral;  posterior   PROSTATE SURGERY     REMOVAL OF STONES  12/21/2018   Procedure: REMOVAL OF STONES;  Surgeon: Jeani Hawking, MD;  Location: [facility] ENDOSCOPY;  Service: Endoscopy;;   SPHINCTEROTOMY  12/21/2018   Procedure: Dennison Mascot;  Surgeon: Jeani Hawking, MD;  Location: [facility] ENDOSCOPY;  Service: Endoscopy;;   Patient Active Problem List   Diagnosis Date Noted   Osteomyelitis of great toe of left foot (HCC) 06/20/2023   Gangrene of toe of left foot (HCC) 06/19/2023   Normocytic anemia 06/19/2023   Eschar of left great toe 05/22/2023   Near syncope 06/15/2021   Dizziness 06/15/2021   Lumbar stenosis with neurogenic claudication 11/09/2020   CKD stage 3 due to type 2 diabetes mellitus (HCC) 12/14/2019   Asymptomatic bilateral carotid artery stenosis 12/14/2019   Abdominal distention    CAD s/p CABG in 2009 12/14/2018   Acute renal failure superimposed on stage 3 chronic kidney disease (HCC) 12/14/2018   UTI (urinary tract infection) 12/14/2018   HLD (hyperlipidemia) 12/14/2018   Sepsis (HCC) 12/14/2018   Abnormal LFTs    S/P CABG (coronary artery bypass graft) 12/06/2018   Asymptomatic stenosis of right carotid artery 12/06/2018   Claudication in peripheral vascular disease (HCC) 12/06/2018   Orthostatic hypotension due to diabetic dysautonomia 12/06/2018   Elevated liver enzymes 12/06/2018   Abdominal pain 10/12/2018   Abnormal liver function tests 10/12/2018   Foreign body in stomach 10/12/2018   Primary hypertension    GERD (gastroesophageal reflux disease)    Diabetes mellitus without complication (HCC)    Atherosclerosis of native coronary artery of native heart without angina pectoris     PCP: Eloisa Northern, MD   REFERRING PROVIDER: Nadara Mustard, MD  ONSET DATE: 09/14/2023 Prosthesis delivery  REFERRING DIAG: Z61.096 (ICD-10-CM) - Left below-knee amputee   THERAPY DIAG:  Other abnormalities of gait and mobility  Unsteadiness on  feet  Muscle weakness (generalized)  Dizziness and giddiness  Impaired functional mobility, balance, gait, and endurance  Rationale for Evaluation and Treatment: Rehabilitation  SUBJECTIVE:   SUBJECTIVE STATEMENT: He is wearing prosthesis all awake hours and drying / checking every 5  hours.    PERTINENT HISTORY: Left TTA 06/24/23, DM2, retinopathy, GERD, HLD, HTN, CAD, CABG 2009, PAD, prostate CA, CKD stage 3b, arthritis, Laminectomy & Foraminotomy bilateral L4-5,   PAIN:  Are you having pain? No, 0/10 pain.  PRECAUTIONS: Fall  WEIGHT BEARING RESTRICTIONS: No  FALLS: Has patient fallen in last 6 months? Yes. Number of falls 1 no injuries  LIVING ENVIRONMENT: Lives with: lives with their family Lives in: House Home Access: Ramped entrance and single step Home layout: Two level, 1/2 bath on main level, and bedroom Stairs: Yes: Internal: 14 steps; on left going up and can reach both and External: 1+1 steps; none Has following equipment at home: Quad cane small base, Environmental consultant - 2 wheeled, Wheelchair (  manual), shower chair, Shower bench, and bed side commode  OCCUPATION: retired from [employer]  PLOF: Independent used cane & walker   PATIENT GOALS: to use prosthesis to walk in home & community, family wants to be safe,  go upstairs to shower  OBJECTIVE:   POSTURE: Evaluation on 09/16/2023:  rounded shoulders, forward head, flexed trunk , and weight shift right  LOWER EXTREMITY ROM:  ROM P:passive  A:active Right 09/24/23 Left 09/24/23  Hip flexion    Hip extension Maisie Fus -5* Thomas -11*  Hip abduction    Hip adduction    Hip internal rotation    Hip external rotation    Knee flexion    Knee extension  Supine P: -7*  Ankle dorsiflexion    Ankle plantarflexion    Ankle inversion    Ankle eversion     (Blank rows = not tested)  LOWER EXTREMITY MMT:  MMT Left 09/24/23  Hip flexion   Hip extension Sidelying 3-/5  Hip abduction Sidleying 3/5  Hip  adduction   Hip internal rotation   Hip external rotation   Knee flexion   Knee extension 3-/5  Ankle dorsiflexion   Ankle plantarflexion   Ankle inversion   Ankle eversion   At Evaluation all strength testing is grossly seated and functionally standing / gait. (Blank rows = not tested)  TRANSFERS: Evaluation on 09/16/2023:  Sit to stand: SBA 20" w/c with armrest to RW limited use of LLE Stand to sit: SBA RW to 20" w/c with armrest limited use of LLE  FUNCTIONAL TESTs:  11/09/2023 Sharlene Motts: 35/56     TUG trial 1: 38sec 2 stumbles with pt able to correct without therapist intervention, trial 2: 34sec no stumbles, MinA to CGA  09/24/2023:  Patient able to stand up from 20" w/c with armrest with RW for stabilization with supervision.  Patient able to maintain upright with RW support without assistance.  Patient able to balance without upper extremity support with minA for 60 seconds.  Deferred at evaluation on 09/16/2023 as limb too edematous to don prosthesis  GAIT: 11/09/2023:  Self selected gait speed: 1.46 ft/sec with stand alone cane, MinA  09/24/2023:   Patient ambulated 50 feet with RW with CGA.  Initially patient walking with a step to pattern with minimal weight shift onto prosthesis.   Deferred at evaluation on 09/16/2023 as limb too edematous to don prosthesis Gait pattern:  Distance walked:  Assistive device utilized:  Level of assistance:  Comments:   CURRENT PROSTHETIC WEAR ASSESSMENT: 11/11/2023: Patient is wearing prosthesis all awake hours and checking / drying or switching Vivewear under liner every 5 hours.  Wound has one superficial scab 2mm & 3mm at distal portion. Remainder is healed.     Evaluation on 09/16/2023:  Patient is dependent with: skin check, residual limb care, care of non-amputated limb, prosthetic cleaning, ply sock cleaning, correct ply sock adjustment, proper wear schedule/adjustment, and proper weight-bearing schedule/adjustment Donning  prosthesis: Max A Doffing prosthesis: Min A Prosthetic wear tolerance: 30-45 min 2 days ago when prosthesis delivered.  Prosthetic weight bearing tolerance: 3 minutes partial weight bearing trying to engage pin suspension.  Pt reports distal lateral limb pain from wear 2 days ago.  Edema: pitting edema  Residual limb condition: scab dry over tibial crest 5cm wide X 15 cm long.  Dry skin, normal color & temperature. Cylindrical shape Prosthetic description: silicon liner with anterior portion 9mm thick, secondary pelite foam liner, total contact socket,     TODAY'S  TREATMENT:                                                                                                                             DATE:  11/11/2023: Prosthetic Training with left Transtibial Amputation: -see objective for prosthetic wear & wound assessment.  -PT reviewed recommendation for walking in house with cane, basic community with family supervision with cane and full community with RW.  PT demo how to provide HHA if he fatigues with cane in community. Pt & dtr verbalized understanding.  -Pt amb 200' with cane working on scanning maintaining path/pace and scanning enviornment -PT demo & verbal cues on technique to pick up pace.  Pt amb 150' with fast pace for 25' 4 reps with cane with CGA. -pt amb backwards, right & left with cane 30' ea with CGA.  -pt neg 6" curb with cane with minA & verbal cues.  -pt neg 12* ramp with cane with minA & verbal cues.  -pt amb 36' without device with MinA.  When PT attempted SBA he deviated to his left.    TREATMENT:                                                                                                                             DATE:  11/09/2023: Prosthetic Training with left Transtibial Amputation: -Instructed on wearing all awake hours and checking every 5hrs for skin issues or Vivewear under liner dampness; then putting it right back on.  -Patient negotiated 2 flights (11  steps ea) of stairs ascending and descending with use of 1 railing + cane changing railing hand for the second flight. Patient required cuing for proper sequencing, CGA to SBA for patient safety. -Ambulation with SPC CGA with scanning L/R while maintaining path & pace. Instances of hesitancy with patient able to continue walking without therapist intervention.       Physical Performance Testing:  See objective data for Berg, TUG and gait velocity.     TREATMENT:  DATE:  11/05/2023: Prosthetic Training with left Transtibial Amputation: PT reviewed adjusting ply socks as his tibia pain may be from too few socks.   Hamstring stretching 30 sec X 2 bilat.  Pt amb 200' with cane working on scanning maintaining path/pace and scanning enviornment PT instructed in negotiating curb with cane technique maintaining momentum.  Pt neg 6.5" curb with cane 2 reps with CGA Pt negotiated ramp  X 2, min A first attempt, CGA 2nd attempt Pt neg 11 stairs with step-to pattern with single rail & cane stand alone tip with MinA/CGA and PT cues on technique.  Pt ambulated with cane and picked up bottle of hand sanitizer from the floor X 5 reps, PT provided demo and cues for technique and he was able to perform CGA except one episode of min A. Pt ambulated without AD 10 feet turned 180 degrees then ambulated another 10 feet back to chair with CGA. This was progressed to holding simulated plate of foot and water bottle so his hands were occupied and he was able to repeat this again CGA overall. Step ups onto airex pad without UE support min-mod A, held balance 30 seconds feet apart on pad then stepped down. Performed one rep each of this leading Rt leg and left leg.    PATIENT EDUCATION: PATIENT EDUCATED ON FOLLOWING PROSTHETIC CARE: Education details:   Skin check, Residual limb care, Prosthetic  cleaning, Correct ply sock adjustment, Propper donning, and Proper wear schedule/adjustment Prosthetic wear tolerance: liner only initially 2 hours 3x/day, 7 days/week Person educated: Patient and Child(ren) Education method: Explanation, Demonstration, Tactile cues, and Verbal cues Education comprehension: dtr & granddaughter verbalized understanding, verbal cues required, tactile cues required, and needs further education  HOME EXERCISE PROGRAM:  ASSESSMENT:  CLINICAL IMPRESSION: Patient has made significant progress with his prosthesis wear and mobility.  He arrives to PT now ambulating with RW & prosthesis instead of w/c.  Patient needs further skilled PT to improve his balance and gait.    OBJECTIVE IMPAIRMENTS: Abnormal gait, decreased activity tolerance, decreased balance, decreased endurance, decreased knowledge of condition, decreased knowledge of use of DME, decreased mobility, difficulty walking, decreased ROM, decreased strength, increased edema, prosthetic dependency , and pain.   ACTIVITY LIMITATIONS: carrying, lifting, sitting, standing, stairs, transfers, and locomotion level  PARTICIPATION LIMITATIONS: standing ADLS, cleaning, community activity, and household activity  PERSONAL FACTORS: Age, Fitness, Past/current experiences, Time since onset of injury/illness/exacerbation, and 3+ comorbidities: see PMH  are also affecting patient's functional outcome.   REHAB POTENTIAL: Good  CLINICAL DECISION MAKING: Evolving/moderate complexity  EVALUATION COMPLEXITY: Moderate   GOALS: Goals reviewed with patient? Yes  SHORT TERM GOALS: Target date: 10/15/2023  Patient donnes prosthesis modified independent & family verbalizes proper cleaning. Baseline: SEE OBJECTIVE DATA Goal status: MET 10/07/2023 2.  Patient tolerates prosthesis >8 hrs total /day without skin issues increasing or limb pain >4/10 after standing. Baseline: SEE OBJECTIVE DATA Goal status: MET 10/07/2023  3.   Patient able to reach 7" and look over both shoulders with RW support with supervision. Baseline: SEE OBJECTIVE DATA Goal status: MET 10/14/2023  4. Patient ambulates 80' with RW & prosthesis with supervision. Baseline: SEE OBJECTIVE DATA Goal status: MET 10/07/2023  5. Patient negotiates ramps & curbs with RW & prosthesis with minA. Baseline: SEE OBJECTIVE DATA Goal status: MET 10/07/2023  LONG TERM GOALS: Target date: 12/10/2023  Patient & family demonstrate & verbalize understanding of prosthetic care to enable safe utilization of prosthesis. Baseline: SEE OBJECTIVE DATA Goal  status: Ongoing 11/11/2023  Patient tolerates prosthesis wear >90% of awake hours without skin or limb pain issues. Baseline: SEE OBJECTIVE DATA Goal status: Ongoing 11/11/2023  Task of [employer] with RW support >/= 30/56 to indicate lower fall risk with standing ADLs. Baseline: SEE OBJECTIVE DATA Goal status: Ongoing 11/11/2023  Patient ambulates 85' modified independent &  >200' family supervision with prosthesis & RW. Baseline: SEE OBJECTIVE DATA Goal status: Ongoing 11/11/2023  Patient negotiates ramps, curbs & stairs with single rail with prosthesis & RW with family assistance safely.  Baseline: SEE OBJECTIVE DATA Goal status: Ongoing 11/11/2023   PLAN:  PT FREQUENCY: 2x/week  PT DURATION: 12 weeks  PLANNED INTERVENTIONS: 97164- PT Re-evaluation, 97110-Therapeutic exercises, 97530- Therapeutic activity, 97112- Neuromuscular re-education, [phone]- Self Care, 96295- Gait training, [phone]- Prosthetic training, Patient/Family education, Balance training, Stair training, DME instructions, and physical performance testing  PLAN FOR NEXT SESSION:   work on community based gait with cane including scanning, changing speed and stepping over obstacle. Work on household based gait without device including carrying items.  Balance activities.    Vladimir Faster, PT, DPT 11/11/2023, 4:55 PM    Date of referral:  08/31/2023 Referring provider: Aldean Baker, MD Referring diagnosis? K44.010 (ICD-10-CM) - Left below-knee amputee  Treatment diagnosis? (if different than referring diagnosis)  Other abnormalities of gait and mobility  ICD-10-CM: R26.89   Unsteadiness on feet  ICD-10-CM: R26.81   Muscle weakness (generalized)  ICD-10-CM: M62.81   Impaired functional mobility, balance, gait, and endurance  ICD-10-CM: Z74.09   What was this (referring dx) caused by? Surgery (Type: left BKA)  Nature of Condition: Initial Onset (within last 3 months) prosthesis delivery    Laterality: Lt  Current Functional Measure Score: 11/09/2023 Berg Balance 35/56;  at evalation was standing balance supervision static stance 1 min.   Objective measurements identify impairments when they are compared to normal values, the uninvolved extremity, and prior level of function.  [x]  Yes  []  No  Objective assessment of functional ability: Moderate functional limitations   Briefly describe symptoms: Eval was patient is dependent in prosthetic care & use.  He has impaired mobility. Patient is deconditioned from prolonged limited activity with wound, then amputation. 11/11/2023: patient is wearing prosthesis most of awake hours and wound is almost completely healed. Pt has general understanding of prosthetic care.  He is ambulating in home with cane and community with RW.  Previously was w/c bound.   How did symptoms start: infected wound led to amputation  Average pain intensity:  Last 24 hours: 0  Past week: 0  How often does the pt experience symptoms? Constantly  How much have the symptoms interfered with usual daily activities? Extremely  How has condition changed since care began at this facility? Improved - Better  In general, how is the patients overall health? Good   BACK PAIN (STarT Back Screening Tool) No

## 2023-11-17 ENCOUNTER — Ambulatory Visit: Payer: Medicare Other | Admitting: Physical Therapy

## 2023-11-17 ENCOUNTER — Encounter: Payer: Self-pay | Admitting: Physical Therapy

## 2023-11-17 DIAGNOSIS — R2689 Other abnormalities of gait and mobility: Secondary | ICD-10-CM | POA: Diagnosis not present

## 2023-11-17 DIAGNOSIS — R42 Dizziness and giddiness: Secondary | ICD-10-CM | POA: Diagnosis not present

## 2023-11-17 DIAGNOSIS — Z7409 Other reduced mobility: Secondary | ICD-10-CM

## 2023-11-17 DIAGNOSIS — R2681 Unsteadiness on feet: Secondary | ICD-10-CM

## 2023-11-17 DIAGNOSIS — M6281 Muscle weakness (generalized): Secondary | ICD-10-CM | POA: Diagnosis not present

## 2023-11-17 NOTE — Therapy (Cosign Needed)
OUTPATIENT PHYSICAL THERAPY PROSTHETIC TREATMENT   Patient Name: Justin [name] MRN: [id number] DOB:01/24/1933, 89 y.o., male Today's Date: 11/17/2023  END OF SESSION:  PT End of Session - 11/17/23 1504     Visit Number 12    Number of Visits 25    Date for PT Re-Evaluation 12/10/23    Authorization Type [employer] Medicare    Authorization Time Period $20 co-pay, $20 COPAY 16 PT VISITS APPROVED 12/18-2/12/25    Authorization - Visit Number 12    Authorization - Number of Visits 16    Progress Note Due on Visit 20    PT Start Time 1515    PT Stop Time 1558    PT Time Calculation (min) 43 min    Equipment Utilized During Treatment Gait belt    Activity Tolerance Patient tolerated treatment well    Behavior During Therapy WFL for tasks assessed/performed                 $20 COPAY 16 PT VISITS APPROVED 12/18-2/12/25   Past Medical History:  Diagnosis Date   Anxiety    Arthritis    Carotid arterial disease (HCC)    CKD (chronic kidney disease)    Coronary artery disease    Diabetes mellitus without complication (HCC)    Diabetic retinopathy (HCC)    NPDR OU   Diverticulitis    Dyspnea    GERD (gastroesophageal reflux disease)    History of kidney stones 2021   Hypercholesteremia    Hypertension    Hypertensive retinopathy    OU   Myocardial infarction [facility]) 2009   Pneumonia    as a child   Prostate cancer (HCC)    UTI (lower urinary tract infection)    Past Surgical History:  Procedure Laterality Date   ABDOMINAL AORTOGRAM W/LOWER EXTREMITY N/A 06/22/2023   Procedure: ABDOMINAL AORTOGRAM W/LOWER EXTREMITY;  Surgeon: Maeola Harman, MD;  Location: [facility] INVASIVE CV LAB;  Service: Cardiovascular;  Laterality: N/A;   ABDOMINAL SURGERY     for diverticulitis; also removed appendix   AMPUTATION Left 06/24/2023   Procedure: LEFT BELOW THE KNEE AMPUTATION;  Surgeon: Nadara Mustard, MD;  Location: [facility] OR;  Service: Orthopedics;  Laterality: Left;    APPENDECTOMY     CARDIAC CATHETERIZATION  2018   CATARACT EXTRACTION     CATARACT EXTRACTION, BILATERAL     CHOLECYSTECTOMY N/A 10/12/2017   Procedure: LAPAROSCOPIC CHOLECYSTECTOMY;  Surgeon: Abigail Miyamoto, MD;  Location: [facility] OR;  Service: General;  Laterality: N/A;   CORONARY ARTERY BYPASS GRAFT  2009   ERCP N/A 12/21/2018   Procedure: ENDOSCOPIC RETROGRADE CHOLANGIOPANCREATOGRAPHY (ERCP);  Surgeon: Jeani Hawking, MD;  Location: [facility] ENDOSCOPY;  Service: Endoscopy;  Laterality: N/A;   ESOPHAGOGASTRODUODENOSCOPY (EGD) WITH PROPOFOL N/A 12/17/2018   Procedure: ESOPHAGOGASTRODUODENOSCOPY (EGD) WITH PROPOFOL;  Surgeon: Jeani Hawking, MD;  Location: [facility] ENDOSCOPY;  Service: Endoscopy;  Laterality: N/A;   EUS Left 12/17/2018   Procedure: UPPER ENDOSCOPIC ULTRASOUND (EUS) LINEAR;  Surgeon: Jeani Hawking, MD;  Location: [facility] ENDOSCOPY;  Service: Endoscopy;  Laterality: Left;   EYE SURGERY     "for bleeding in eye"   HERNIA REPAIR     LEFT HEART CATH AND CORONARY ANGIOGRAPHY N/A 11/04/2016   Procedure: Left Heart Cath and Coronary Angiography;  Surgeon: Yates Decamp, MD;  Location: [facility] INVASIVE CV LAB;  Service: Cardiovascular;  Laterality: N/A;   LOWER EXTREMITY ANGIOGRAPHY N/A 11/04/2016   Procedure: Lower Extremity Angiography;  Surgeon: Yates Decamp, MD;  Location:  [facility] INVASIVE CV LAB;  Service: Cardiovascular;  Laterality: N/A;   LOWER EXTREMITY ANGIOGRAPHY N/A 01/03/2020   Procedure: LOWER EXTREMITY ANGIOGRAPHY;  Surgeon: Yates Decamp, MD;  Location: [facility] INVASIVE CV LAB;  Service: Cardiovascular;  Laterality: N/A;   LOWER EXTREMITY ANGIOGRAPHY Bilateral 01/14/2022   Procedure: Lower Extremity Angiography;  Surgeon: Yates Decamp, MD;  Location: [facility] INVASIVE CV LAB;  Service: Cardiovascular;  Laterality: Bilateral;   LUMBAR LAMINECTOMY/DECOMPRESSION MICRODISCECTOMY Bilateral 11/09/2020   Procedure: Laminectomy and Foraminotomy - bilateral - Lumbar Four-Five.;  Surgeon: Julio Sicks, MD;  Location: [facility] OR;  Service:  Neurosurgery;  Laterality: Bilateral;  posterior   PROSTATE SURGERY     REMOVAL OF STONES  12/21/2018   Procedure: REMOVAL OF STONES;  Surgeon: Jeani Hawking, MD;  Location: [facility] ENDOSCOPY;  Service: Endoscopy;;   SPHINCTEROTOMY  12/21/2018   Procedure: Dennison Mascot;  Surgeon: Jeani Hawking, MD;  Location: [facility] ENDOSCOPY;  Service: Endoscopy;;   Patient Active Problem List   Diagnosis Date Noted   Osteomyelitis of great toe of left foot (HCC) 06/20/2023   Gangrene of toe of left foot (HCC) 06/19/2023   Normocytic anemia 06/19/2023   Eschar of left great toe 05/22/2023   Near syncope 06/15/2021   Dizziness 06/15/2021   Lumbar stenosis with neurogenic claudication 11/09/2020   CKD stage 3 due to type 2 diabetes mellitus (HCC) 12/14/2019   Asymptomatic bilateral carotid artery stenosis 12/14/2019   Abdominal distention    CAD s/p CABG in 2009 12/14/2018   Acute renal failure superimposed on stage 3 chronic kidney disease (HCC) 12/14/2018   UTI (urinary tract infection) 12/14/2018   HLD (hyperlipidemia) 12/14/2018   Sepsis (HCC) 12/14/2018   Abnormal LFTs    S/P CABG (coronary artery bypass graft) 12/06/2018   Asymptomatic stenosis of right carotid artery 12/06/2018   Claudication in peripheral vascular disease (HCC) 12/06/2018   Orthostatic hypotension due to diabetic dysautonomia 12/06/2018   Elevated liver enzymes 12/06/2018   Abdominal pain 10/12/2018   Abnormal liver function tests 10/12/2018   Foreign body in stomach 10/12/2018   Primary hypertension    GERD (gastroesophageal reflux disease)    Diabetes mellitus without complication (HCC)    Atherosclerosis of native coronary artery of native heart without angina pectoris     PCP: Eloisa Northern, MD   REFERRING PROVIDER: Nadara Mustard, MD  ONSET DATE: 09/14/2023 Prosthesis delivery  REFERRING DIAG: Z61.096 (ICD-10-CM) - Left below-knee amputee   THERAPY DIAG:  Other abnormalities of gait and mobility  Unsteadiness on  feet  Dizziness and giddiness  Muscle weakness (generalized)  Impaired functional mobility, balance, gait, and endurance  Rationale for Evaluation and Treatment: Rehabilitation  SUBJECTIVE:   SUBJECTIVE STATEMENT: Arrives with just his cane today with family supervision. This is the first day he has used just the cane for ambulation into the clinic. Had a few stumbles when walking in however he was able to self correct to avoid falling. Seemed to be that he was walking too fast and could not get his foot properly placed. Pt reports he fell after last session when not wearing his prosthesis without injuries.  He was in his recliner and tried to transfer to unlocked w/c.   PERTINENT HISTORY: Left TTA 06/24/23, DM2, retinopathy, GERD, HLD, HTN, CAD, CABG 2009, PAD, prostate CA, CKD stage 3b, arthritis, Laminectomy & Foraminotomy bilateral L4-5,   PAIN:  Are you having pain? No, 0/10 pain.  PRECAUTIONS: Fall  WEIGHT BEARING RESTRICTIONS: No  FALLS: Has patient fallen in last 6  months? Yes. Number of falls 1 no injuries  LIVING ENVIRONMENT: Lives with: lives with their family Lives in: House Home Access: Ramped entrance and single step Home layout: Two level, 1/2 bath on main level, and bedroom Stairs: Yes: Internal: 14 steps; on left going up and can reach both and External: 1+1 steps; none Has following equipment at home: Quad cane small base, Environmental consultant - 2 wheeled, Wheelchair (manual), shower chair, Shower bench, and bed side commode  OCCUPATION: retired from [employer]  PLOF: Independent used cane & walker   PATIENT GOALS: to use prosthesis to walk in home & community, family wants to be safe,  go upstairs to shower  OBJECTIVE:   POSTURE: Evaluation on 09/16/2023:  rounded shoulders, forward head, flexed trunk , and weight shift right  LOWER EXTREMITY ROM:  ROM P:passive  A:active Right 09/24/23 Left 09/24/23  Hip flexion    Hip extension Maisie Fus -5* Thomas -11*  Hip  abduction    Hip adduction    Hip internal rotation    Hip external rotation    Knee flexion    Knee extension  Supine P: -7*  Ankle dorsiflexion    Ankle plantarflexion    Ankle inversion    Ankle eversion     (Blank rows = not tested)  LOWER EXTREMITY MMT:  MMT Left 09/24/23  Hip flexion   Hip extension Sidelying 3-/5  Hip abduction Sidleying 3/5  Hip adduction   Hip internal rotation   Hip external rotation   Knee flexion   Knee extension 3-/5  Ankle dorsiflexion   Ankle plantarflexion   Ankle inversion   Ankle eversion   At Evaluation all strength testing is grossly seated and functionally standing / gait. (Blank rows = not tested)  TRANSFERS: Evaluation on 09/16/2023:  Sit to stand: SBA 20" w/c with armrest to RW limited use of LLE Stand to sit: SBA RW to 20" w/c with armrest limited use of LLE  FUNCTIONAL TESTs:  11/09/2023 Sharlene Motts: 35/56     TUG trial 1: 38sec 2 stumbles with pt able to correct without therapist intervention, trial 2: 34sec no stumbles, MinA to CGA  09/24/2023:  Patient able to stand up from 20" w/c with armrest with RW for stabilization with supervision.  Patient able to maintain upright with RW support without assistance.  Patient able to balance without upper extremity support with minA for 60 seconds.  Deferred at evaluation on 09/16/2023 as limb too edematous to don prosthesis  GAIT: 11/17/2023: Ambulated with stand alone cane CGA with bouts of SBA, step through pattern with inconsistent gait cadence. Was also able to walk short distances without AD and CGA with vc to slow gait down for less instances of tripping.  11/09/2023:  Self selected gait speed: 1.46 ft/sec with stand alone cane, MinA  09/24/2023:   Patient ambulated 50 feet with RW with CGA.  Initially patient walking with a step to pattern with minimal weight shift onto prosthesis.   Deferred at evaluation on 09/16/2023 as limb too edematous to don prosthesis Gait pattern:   Distance walked:  Assistive device utilized:  Level of assistance:  Comments:   CURRENT PROSTHETIC WEAR ASSESSMENT: 11/11/2023: Patient is wearing prosthesis all awake hours and checking / drying or switching Vivewear under liner every 5 hours.  Wound has one superficial scab 2mm & 3mm at distal portion. Remainder is healed.     Evaluation on 09/16/2023:  Patient is dependent with: skin check, residual limb care, care of non-amputated limb,  prosthetic cleaning, ply sock cleaning, correct ply sock adjustment, proper wear schedule/adjustment, and proper weight-bearing schedule/adjustment Donning prosthesis: Max A Doffing prosthesis: Min A Prosthetic wear tolerance: 30-45 min 2 days ago when prosthesis delivered.  Prosthetic weight bearing tolerance: 3 minutes partial weight bearing trying to engage pin suspension.  Pt reports distal lateral limb pain from wear 2 days ago.  Edema: pitting edema  Residual limb condition: scab dry over tibial crest 5cm wide X 15 cm long.  Dry skin, normal color & temperature. Cylindrical shape Prosthetic description: silicon liner with anterior portion 9mm thick, secondary pelite foam liner, total contact socket,     TODAY'S TREATMENT:                                                                                                                             DATE:  11/17/2023: Prosthetic Training with left Transtibial Amputation: PT educated pt & dtr in higher fall risk when prosthesis is off as he is subconsciously trusting prosthesis at this point. So he may move without thinking about fact that prosthesis is not there. Pt verbalized understanding.   Patient's wound now only has one superficial dry scab at proximal portion of scar on tibial crest.   Patient ambulated with stand alone cane around the clinic while searching the area for 11 cones. Able to navigate around people and objects with intermittent vc to scan room for cones. Patient able to reach  outside BOS using cane for support. 2 instances of tripping due to inadequate step and bend of knee requiring minA for patient to maintain upright.  Patient carried 5 varied weights (1.5-4lb) individually from mat to bench (~13ft) with cane and CGA. Required to navigate around people and performed reaching outside BOS without assist from mat table to retrieve weight. Able to remain upright and with good cadence when ambulating with weight from mat to bench.  Patient ambulated 54' X 3 with CGA in a circle without use of AD, required heavy vc and tc for proper arm swing to promote weight shifting. Patient tends to veer to R when ambulating, attention called to veering with patient only briefly being able to rectify.     TREATMENT:                                                                                                                             DATE:  11/11/2023: Prosthetic Training with left  Transtibial Amputation: -see objective for prosthetic wear & wound assessment.  -PT reviewed recommendation for walking in house with cane, basic community with family supervision with cane and full community with RW.  PT demo how to provide HHA if he fatigues with cane in community. Pt & dtr verbalized understanding.  -Pt amb 200' with cane working on scanning maintaining path/pace and scanning enviornment -PT demo & verbal cues on technique to pick up pace.  Pt amb 150' with fast pace for 25' 4 reps with cane with CGA. -pt amb backwards, right & left with cane 30' ea with CGA.  -pt neg 6" curb with cane with minA & verbal cues.  -pt neg 12* ramp with cane with minA & verbal cues.  -pt amb 64' without device with MinA.  When PT attempted SBA he deviated to his left.    TREATMENT:                                                                                                                             DATE:  11/09/2023: Prosthetic Training with left Transtibial Amputation: -Instructed on wearing all  awake hours and checking every 5hrs for skin issues or Vivewear under liner dampness; then putting it right back on.  -Patient negotiated 2 flights (11 steps ea) of stairs ascending and descending with use of 1 railing + cane changing railing hand for the second flight. Patient required cuing for proper sequencing, CGA to SBA for patient safety. -Ambulation with SPC CGA with scanning L/R while maintaining path & pace. Instances of hesitancy with patient able to continue walking without therapist intervention.       Physical Performance Testing:  See objective data for Berg, TUG and gait velocity.      PATIENT EDUCATION: PATIENT EDUCATED ON FOLLOWING PROSTHETIC CARE: Education details:   Skin check, Residual limb care, Prosthetic cleaning, Correct ply sock adjustment, Propper donning, and Proper wear schedule/adjustment Prosthetic wear tolerance: liner only initially 2 hours 3x/day, 7 days/week Person educated: Patient and Child(ren) Education method: Explanation, Demonstration, Tactile cues, and Verbal cues Education comprehension: dtr & granddaughter verbalized understanding, verbal cues required, tactile cues required, and needs further education  HOME EXERCISE PROGRAM:  ASSESSMENT:  CLINICAL IMPRESSION: Continues to make progress towards goals and increased independence. Arrived with only stand alone cane and despite initial tripping did well with ambulation activity with only 2 instances of tripping. Was able to progress ambulation without AD however increased instance of veering to R were noted. Patient will continue to benefit from continued skilled physical therapy to address deficits.    OBJECTIVE IMPAIRMENTS: Abnormal gait, decreased activity tolerance, decreased balance, decreased endurance, decreased knowledge of condition, decreased knowledge of use of DME, decreased mobility, difficulty walking, decreased ROM, decreased strength, increased edema, prosthetic dependency , and pain.    ACTIVITY LIMITATIONS: carrying, lifting, sitting, standing, stairs, transfers, and locomotion level  PARTICIPATION LIMITATIONS: standing ADLS, cleaning, community activity, and household activity  PERSONAL FACTORS:  Age, Fitness, Past/current experiences, Time since onset of injury/illness/exacerbation, and 3+ comorbidities: see PMH  are also affecting patient's functional outcome.   REHAB POTENTIAL: Good  CLINICAL DECISION MAKING: Evolving/moderate complexity  EVALUATION COMPLEXITY: Moderate   GOALS: Goals reviewed with patient? Yes  SHORT TERM GOALS: Target date: 10/15/2023  Patient donnes prosthesis modified independent & family verbalizes proper cleaning. Baseline: SEE OBJECTIVE DATA Goal status: MET 10/07/2023 2.  Patient tolerates prosthesis >8 hrs total /day without skin issues increasing or limb pain >4/10 after standing. Baseline: SEE OBJECTIVE DATA Goal status: MET 10/07/2023  3.  Patient able to reach 7" and look over both shoulders with RW support with supervision. Baseline: SEE OBJECTIVE DATA Goal status: MET 10/14/2023  4. Patient ambulates 76' with RW & prosthesis with supervision. Baseline: SEE OBJECTIVE DATA Goal status: MET 10/07/2023  5. Patient negotiates ramps & curbs with RW & prosthesis with minA. Baseline: SEE OBJECTIVE DATA Goal status: MET 10/07/2023  LONG TERM GOALS: Target date: 12/10/2023  Patient & family demonstrate & verbalize understanding of prosthetic care to enable safe utilization of prosthesis. Baseline: SEE OBJECTIVE DATA Goal status: Ongoing 11/11/2023  Patient tolerates prosthesis wear >90% of awake hours without skin or limb pain issues. Baseline: SEE OBJECTIVE DATA Goal status: Ongoing 11/11/2023  Task of [employer] with RW support >/= 30/56 to indicate lower fall risk with standing ADLs. Baseline: SEE OBJECTIVE DATA Goal status: Ongoing 11/11/2023  Patient ambulates 7' modified independent &  >200' family supervision with  prosthesis & RW. Baseline: SEE OBJECTIVE DATA Goal status: Ongoing 11/11/2023  Patient negotiates ramps, curbs & stairs with single rail with prosthesis & RW with family assistance safely.  Baseline: SEE OBJECTIVE DATA Goal status: Ongoing 11/11/2023   PLAN:  PT FREQUENCY: 2x/week  PT DURATION: 12 weeks  PLANNED INTERVENTIONS: 97164- PT Re-evaluation, 97110-Therapeutic exercises, 97530- Therapeutic activity, 97112- Neuromuscular re-education, [phone]- Self Care, 35573- Gait training, [phone]- Prosthetic training, Patient/Family education, Balance training, Stair training, DME instructions, and physical performance testing  PLAN FOR NEXT SESSION:   work on changing speed and stepping over obstacles with use of cane. Work on bending down lower to pick up items with varied weight. Balance activities.    La Dibella, Jadd Gasior, Student-PT 11/17/2023, 5:41 PM  This entire session of physical therapy was performed under the direct supervision of PT signing evaluation /treatment. PT reviewed note and agrees.   Vladimir Faster, PT, DPT 11/18/2023, 8:16 AM   Date of referral: 08/31/2023 Referring provider: Aldean Baker, MD Referring diagnosis? K27.062 (ICD-10-CM) - Left below-knee amputee  Treatment diagnosis? (if different than referring diagnosis)  Other abnormalities of gait and mobility  ICD-10-CM: R26.89   Unsteadiness on feet  ICD-10-CM: R26.81   Muscle weakness (generalized)  ICD-10-CM: M62.81   Impaired functional mobility, balance, gait, and endurance  ICD-10-CM: Z74.09   What was this (referring dx) caused by? Surgery (Type: left BKA)  Nature of Condition: Initial Onset (within last 3 months) prosthesis delivery    Laterality: Lt  Current Functional Measure Score: 11/09/2023 Berg Balance 35/56;  at evalation was standing balance supervision static stance 1 min.   Objective measurements identify impairments when they are compared to normal values, the uninvolved extremity, and  prior level of function.  [x]  Yes  []  No  Objective assessment of functional ability: Moderate functional limitations   Briefly describe symptoms: Eval was patient is dependent in prosthetic care & use.  He has impaired mobility. Patient is deconditioned from prolonged limited activity with wound, then amputation. 11/11/2023:  patient is wearing prosthesis most of awake hours and wound is almost completely healed. Pt has general understanding of prosthetic care.  He is ambulating in home with cane and community with RW.  Previously was w/c bound.   How did symptoms start: infected wound led to amputation  Average pain intensity:  Last 24 hours: 0  Past week: 0  How often does the pt experience symptoms? Constantly  How much have the symptoms interfered with usual daily activities? Extremely  How has condition changed since care began at this facility? Improved - Better  In general, how is the patients overall health? Good   BACK PAIN (STarT Back Screening Tool) No

## 2023-11-18 ENCOUNTER — Ambulatory Visit: Payer: Medicare Other | Admitting: Cardiology

## 2023-11-23 ENCOUNTER — Encounter: Payer: Medicare Other | Admitting: Physical Therapy

## 2023-11-25 ENCOUNTER — Encounter: Payer: Medicare Other | Admitting: Physical Therapy

## 2023-11-30 ENCOUNTER — Encounter: Payer: Self-pay | Admitting: Physical Therapy

## 2023-11-30 ENCOUNTER — Ambulatory Visit: Payer: Medicare Other | Admitting: Physical Therapy

## 2023-11-30 DIAGNOSIS — R2681 Unsteadiness on feet: Secondary | ICD-10-CM

## 2023-11-30 DIAGNOSIS — Z7409 Other reduced mobility: Secondary | ICD-10-CM

## 2023-11-30 DIAGNOSIS — M6281 Muscle weakness (generalized): Secondary | ICD-10-CM | POA: Diagnosis not present

## 2023-11-30 DIAGNOSIS — R2689 Other abnormalities of gait and mobility: Secondary | ICD-10-CM

## 2023-11-30 DIAGNOSIS — R42 Dizziness and giddiness: Secondary | ICD-10-CM

## 2023-11-30 NOTE — Therapy (Signed)
 OUTPATIENT PHYSICAL THERAPY PROSTHETIC TREATMENT   Patient Name: Justin [name] MRN: [id number] DOB:12-16-1932, 89 y.o., male Today's Date: 11/30/2023  END OF SESSION:  PT End of Session - 11/30/23 1055     Visit Number 13    Number of Visits 25    Date for PT Re-Evaluation 12/10/23    Authorization Type [employer] Medicare    Authorization Time Period $20 co-pay, $20 COPAY 16 PT VISITS APPROVED 12/18-2/12/25    Authorization - Visit Number 13    Authorization - Number of Visits 16    Progress Note Due on Visit 20    PT Start Time 1055    PT Stop Time 1135    PT Time Calculation (min) 40 min    Equipment Utilized During Treatment Gait belt    Activity Tolerance Patient tolerated treatment well    Behavior During Therapy WFL for tasks assessed/performed                  $20 COPAY 16 PT VISITS APPROVED 12/18-2/12/25   Past Medical History:  Diagnosis Date   Anxiety    Arthritis    Carotid arterial disease (HCC)    CKD (chronic kidney disease)    Coronary artery disease    Diabetes mellitus without complication (HCC)    Diabetic retinopathy (HCC)    NPDR OU   Diverticulitis    Dyspnea    GERD (gastroesophageal reflux disease)    History of kidney stones 2021   Hypercholesteremia    Hypertension    Hypertensive retinopathy    OU   Myocardial infarction [facility]) 2009   Pneumonia    as a child   Prostate cancer (HCC)    UTI (lower urinary tract infection)    Past Surgical History:  Procedure Laterality Date   ABDOMINAL AORTOGRAM W/LOWER EXTREMITY N/A 06/22/2023   Procedure: ABDOMINAL AORTOGRAM W/LOWER EXTREMITY;  Surgeon: Maeola Harman, MD;  Location: [facility] INVASIVE CV LAB;  Service: Cardiovascular;  Laterality: N/A;   ABDOMINAL SURGERY     for diverticulitis; also removed appendix   AMPUTATION Left 06/24/2023   Procedure: LEFT BELOW THE KNEE AMPUTATION;  Surgeon: Nadara Mustard, MD;  Location: [facility] OR;  Service: Orthopedics;  Laterality: Left;    APPENDECTOMY     CARDIAC CATHETERIZATION  2018   CATARACT EXTRACTION     CATARACT EXTRACTION, BILATERAL     CHOLECYSTECTOMY N/A 10/12/2017   Procedure: LAPAROSCOPIC CHOLECYSTECTOMY;  Surgeon: Abigail Miyamoto, MD;  Location: [facility] OR;  Service: General;  Laterality: N/A;   CORONARY ARTERY BYPASS GRAFT  2009   ERCP N/A 12/21/2018   Procedure: ENDOSCOPIC RETROGRADE CHOLANGIOPANCREATOGRAPHY (ERCP);  Surgeon: Jeani Hawking, MD;  Location: [facility] ENDOSCOPY;  Service: Endoscopy;  Laterality: N/A;   ESOPHAGOGASTRODUODENOSCOPY (EGD) WITH PROPOFOL N/A 12/17/2018   Procedure: ESOPHAGOGASTRODUODENOSCOPY (EGD) WITH PROPOFOL;  Surgeon: Jeani Hawking, MD;  Location: [facility] ENDOSCOPY;  Service: Endoscopy;  Laterality: N/A;   EUS Left 12/17/2018   Procedure: UPPER ENDOSCOPIC ULTRASOUND (EUS) LINEAR;  Surgeon: Jeani Hawking, MD;  Location: [facility] ENDOSCOPY;  Service: Endoscopy;  Laterality: Left;   EYE SURGERY     "for bleeding in eye"   HERNIA REPAIR     LEFT HEART CATH AND CORONARY ANGIOGRAPHY N/A 11/04/2016   Procedure: Left Heart Cath and Coronary Angiography;  Surgeon: Yates Decamp, MD;  Location: [facility] INVASIVE CV LAB;  Service: Cardiovascular;  Laterality: N/A;   LOWER EXTREMITY ANGIOGRAPHY N/A 11/04/2016   Procedure: Lower Extremity Angiography;  Surgeon: Yates Decamp, MD;  Location: [facility] INVASIVE CV LAB;  Service: Cardiovascular;  Laterality: N/A;   LOWER EXTREMITY ANGIOGRAPHY N/A 01/03/2020   Procedure: LOWER EXTREMITY ANGIOGRAPHY;  Surgeon: Yates Decamp, MD;  Location: [facility] INVASIVE CV LAB;  Service: Cardiovascular;  Laterality: N/A;   LOWER EXTREMITY ANGIOGRAPHY Bilateral 01/14/2022   Procedure: Lower Extremity Angiography;  Surgeon: Yates Decamp, MD;  Location: [facility] INVASIVE CV LAB;  Service: Cardiovascular;  Laterality: Bilateral;   LUMBAR LAMINECTOMY/DECOMPRESSION MICRODISCECTOMY Bilateral 11/09/2020   Procedure: Laminectomy and Foraminotomy - bilateral - Lumbar Four-Five.;  Surgeon: Julio Sicks, MD;  Location: [facility] OR;  Service:  Neurosurgery;  Laterality: Bilateral;  posterior   PROSTATE SURGERY     REMOVAL OF STONES  12/21/2018   Procedure: REMOVAL OF STONES;  Surgeon: Jeani Hawking, MD;  Location: [facility] ENDOSCOPY;  Service: Endoscopy;;   SPHINCTEROTOMY  12/21/2018   Procedure: Dennison Mascot;  Surgeon: Jeani Hawking, MD;  Location: [facility] ENDOSCOPY;  Service: Endoscopy;;   Patient Active Problem List   Diagnosis Date Noted   Osteomyelitis of great toe of left foot (HCC) 06/20/2023   Gangrene of toe of left foot (HCC) 06/19/2023   Normocytic anemia 06/19/2023   Eschar of left great toe 05/22/2023   Near syncope 06/15/2021   Dizziness 06/15/2021   Lumbar stenosis with neurogenic claudication 11/09/2020   CKD stage 3 due to type 2 diabetes mellitus (HCC) 12/14/2019   Asymptomatic bilateral carotid artery stenosis 12/14/2019   Abdominal distention    CAD s/p CABG in 2009 12/14/2018   Acute renal failure superimposed on stage 3 chronic kidney disease (HCC) 12/14/2018   UTI (urinary tract infection) 12/14/2018   HLD (hyperlipidemia) 12/14/2018   Sepsis (HCC) 12/14/2018   Abnormal LFTs    S/P CABG (coronary artery bypass graft) 12/06/2018   Asymptomatic stenosis of right carotid artery 12/06/2018   Claudication in peripheral vascular disease (HCC) 12/06/2018   Orthostatic hypotension due to diabetic dysautonomia 12/06/2018   Elevated liver enzymes 12/06/2018   Abdominal pain 10/12/2018   Abnormal liver function tests 10/12/2018   Foreign body in stomach 10/12/2018   Primary hypertension    GERD (gastroesophageal reflux disease)    Diabetes mellitus without complication (HCC)    Atherosclerosis of native coronary artery of native heart without angina pectoris     PCP: Eloisa Northern, MD   REFERRING PROVIDER: Nadara Mustard, MD  ONSET DATE: 09/14/2023 Prosthesis delivery  REFERRING DIAG: Z61.096 (ICD-10-CM) - Left below-knee amputee   THERAPY DIAG:  Other abnormalities of gait and mobility  Unsteadiness on  feet  Dizziness and giddiness  Muscle weakness (generalized)  Impaired functional mobility, balance, gait, and endurance  Rationale for Evaluation and Treatment: Rehabilitation  SUBJECTIVE:   SUBJECTIVE STATEMENT: He has not had any issues.  He is wearing prosthesis all awake hours.    PERTINENT HISTORY: Left TTA 06/24/23, DM2, retinopathy, GERD, HLD, HTN, CAD, CABG 2009, PAD, prostate CA, CKD stage 3b, arthritis, Laminectomy & Foraminotomy bilateral L4-5,   PAIN:  Are you having pain? No, 0/10 pain.  PRECAUTIONS: Fall  WEIGHT BEARING RESTRICTIONS: No  FALLS: Has patient fallen in last 6 months? Yes. Number of falls 1 no injuries  LIVING ENVIRONMENT: Lives with: lives with their family Lives in: House Home Access: Ramped entrance and single step Home layout: Two level, 1/2 bath on main level, and bedroom Stairs: Yes: Internal: 14 steps; on left going up and can reach both and External: 1+1 steps; none Has following equipment at home: Quad cane small base, Environmental consultant - 2 wheeled, Wheelchair (  manual), shower chair, Shower bench, and bed side commode  OCCUPATION: retired from [employer]  PLOF: Independent used cane & walker   PATIENT GOALS: to use prosthesis to walk in home & community, family wants to be safe,  go upstairs to shower  OBJECTIVE:   POSTURE: Evaluation on 09/16/2023:  rounded shoulders, forward head, flexed trunk , and weight shift right  LOWER EXTREMITY ROM:  ROM P:passive  A:active Right 09/24/23 Left 09/24/23  Hip flexion    Hip extension Maisie Fus -5* Thomas -11*  Hip abduction    Hip adduction    Hip internal rotation    Hip external rotation    Knee flexion    Knee extension  Supine P: -7*  Ankle dorsiflexion    Ankle plantarflexion    Ankle inversion    Ankle eversion     (Blank rows = not tested)  LOWER EXTREMITY MMT:  MMT Left 09/24/23  Hip flexion   Hip extension Sidelying 3-/5  Hip abduction Sidleying 3/5  Hip adduction    Hip internal rotation   Hip external rotation   Knee flexion   Knee extension 3-/5  Ankle dorsiflexion   Ankle plantarflexion   Ankle inversion   Ankle eversion   At Evaluation all strength testing is grossly seated and functionally standing / gait. (Blank rows = not tested)  TRANSFERS: Evaluation on 09/16/2023:  Sit to stand: SBA 20" w/c with armrest to RW limited use of LLE Stand to sit: SBA RW to 20" w/c with armrest limited use of LLE  FUNCTIONAL TESTs:  11/09/2023 Sharlene Motts: 35/56     TUG trial 1: 38sec 2 stumbles with pt able to correct without therapist intervention, trial 2: 34sec no stumbles, MinA to CGA  09/24/2023:  Patient able to stand up from 20" w/c with armrest with RW for stabilization with supervision.  Patient able to maintain upright with RW support without assistance.  Patient able to balance without upper extremity support with minA for 60 seconds.  Deferred at evaluation on 09/16/2023 as limb too edematous to don prosthesis  GAIT: 11/17/2023: Ambulated with stand alone cane CGA with bouts of SBA, step through pattern with inconsistent gait cadence. Was also able to walk short distances without AD and CGA with vc to slow gait down for less instances of tripping.  11/09/2023:  Self selected gait speed: 1.46 ft/sec with stand alone cane, MinA  09/24/2023:   Patient ambulated 50 feet with RW with CGA.  Initially patient walking with a step to pattern with minimal weight shift onto prosthesis.   Deferred at evaluation on 09/16/2023 as limb too edematous to don prosthesis Gait pattern:  Distance walked:  Assistive device utilized:  Level of assistance:  Comments:   CURRENT PROSTHETIC WEAR ASSESSMENT: 11/11/2023: Patient is wearing prosthesis all awake hours and checking / drying or switching Vivewear under liner every 5 hours.  Wound has one superficial scab 2mm & 3mm at distal portion. Remainder is healed.     Evaluation on 09/16/2023:  Patient is  dependent with: skin check, residual limb care, care of non-amputated limb, prosthetic cleaning, ply sock cleaning, correct ply sock adjustment, proper wear schedule/adjustment, and proper weight-bearing schedule/adjustment Donning prosthesis: Max A Doffing prosthesis: Min A Prosthetic wear tolerance: 30-45 min 2 days ago when prosthesis delivered.  Prosthetic weight bearing tolerance: 3 minutes partial weight bearing trying to engage pin suspension.  Pt reports distal lateral limb pain from wear 2 days ago.  Edema: pitting edema  Residual limb condition:  scab dry over tibial crest 5cm wide X 15 cm long.  Dry skin, normal color & temperature. Cylindrical shape Prosthetic description: silicon liner with anterior portion 9mm thick, secondary pelite foam liner, total contact socket,     TODAY'S TREATMENT:                                                                                                                             DATE:  11/30/2023: Prosthetic Training with left Transtibial Amputation: Wound has completely healed.  PT recommended stopping use of Vivewear under liner and checking skin 2x/day. Pt & dtr verbalized understanding.  Pt amb 100' without device with supervision working on scanning right/left. Pt neg 12* ramp & 6.5" curb with cane with CGA. Pt improved with less assistance since last session.  Pt neg stairs with 2 rails 11 steps: descending step-to pattern alternating lead LE with each step; ascending alternating pattern. PT demo & verbal cues on technique.    Neuromuscular Re-education:  4-square stepping without device with minA for balance 3 reps CW & CCW.  Ambulating outward powering over resistance and return controlling stepping against resistance 5# anterior, posterior, to right & to left 3 reps ea. PT minA/CGA for safety & tactile cues.  Pt improved with instruction & reps.      TREATMENT:                                                                                                                              DATE:  11/17/2023: Prosthetic Training with left Transtibial Amputation: PT educated pt & dtr in higher fall risk when prosthesis is off as he is subconsciously trusting prosthesis at this point. So he may move without thinking about fact that prosthesis is not there. Pt verbalized understanding.   Patient's wound now only has one superficial dry scab at proximal portion of scar on tibial crest.   Patient ambulated with stand alone cane around the clinic while searching the area for 11 cones. Able to navigate around people and objects with intermittent vc to scan room for cones. Patient able to reach outside BOS using cane for support. 2 instances of tripping due to inadequate step and bend of knee requiring minA for patient to maintain upright.  Patient carried 5 varied weights (1.5-4lb) individually from mat to bench (~85ft) with cane and CGA. Required to navigate around people and performed reaching outside BOS without assist  from mat table to retrieve weight. Able to remain upright and with good cadence when ambulating with weight from mat to bench.  Patient ambulated 85' X 3 with CGA in a circle without use of AD, required heavy vc and tc for proper arm swing to promote weight shifting. Patient tends to veer to R when ambulating, attention called to veering with patient only briefly being able to rectify.     TREATMENT:                                                                                                                             DATE:  11/11/2023: Prosthetic Training with left Transtibial Amputation: -see objective for prosthetic wear & wound assessment.  -PT reviewed recommendation for walking in house with cane, basic community with family supervision with cane and full community with RW.  PT demo how to provide HHA if he fatigues with cane in community. Pt & dtr verbalized understanding.  -Pt amb 200' with cane working on scanning  maintaining path/pace and scanning enviornment -PT demo & verbal cues on technique to pick up pace.  Pt amb 150' with fast pace for 25' 4 reps with cane with CGA. -pt amb backwards, right & left with cane 30' ea with CGA.  -pt neg 6" curb with cane with minA & verbal cues.  -pt neg 12* ramp with cane with minA & verbal cues.  -pt amb 81' without device with MinA.  When PT attempted SBA he deviated to his left.     PATIENT EDUCATION: PATIENT EDUCATED ON FOLLOWING PROSTHETIC CARE: Education details:   Skin check, Residual limb care, Prosthetic cleaning, Correct ply sock adjustment, Propper donning, and Proper wear schedule/adjustment Prosthetic wear tolerance: liner only initially 2 hours 3x/day, 7 days/week Person educated: Patient and Child(ren) Education method: Explanation, Demonstration, Tactile cues, and Verbal cues Education comprehension: dtr & granddaughter verbalized understanding, verbal cues required, tactile cues required, and needs further education  HOME EXERCISE PROGRAM:  ASSESSMENT:  CLINICAL IMPRESSION: Patient appears on target to meet LTGs by end of next week.  He improved balance reactions with 4-square stepping and resisted gait activities.  His wound appears completely healed.  Patient will continue to benefit from continued skilled physical therapy to address deficits.    OBJECTIVE IMPAIRMENTS: Abnormal gait, decreased activity tolerance, decreased balance, decreased endurance, decreased knowledge of condition, decreased knowledge of use of DME, decreased mobility, difficulty walking, decreased ROM, decreased strength, increased edema, prosthetic dependency , and pain.   ACTIVITY LIMITATIONS: carrying, lifting, sitting, standing, stairs, transfers, and locomotion level  PARTICIPATION LIMITATIONS: standing ADLS, cleaning, community activity, and household activity  PERSONAL FACTORS: Age, Fitness, Past/current experiences, Time since onset of  injury/illness/exacerbation, and 3+ comorbidities: see PMH  are also affecting patient's functional outcome.   REHAB POTENTIAL: Good  CLINICAL DECISION MAKING: Evolving/moderate complexity  EVALUATION COMPLEXITY: Moderate   GOALS: Goals reviewed with patient? Yes  SHORT TERM GOALS: Target date: 10/15/2023  Patient  donnes prosthesis modified independent & family verbalizes proper cleaning. Baseline: SEE OBJECTIVE DATA Goal status: MET 10/07/2023 2.  Patient tolerates prosthesis >8 hrs total /day without skin issues increasing or limb pain >4/10 after standing. Baseline: SEE OBJECTIVE DATA Goal status: MET 10/07/2023  3.  Patient able to reach 7" and look over both shoulders with RW support with supervision. Baseline: SEE OBJECTIVE DATA Goal status: MET 10/14/2023  4. Patient ambulates 79' with RW & prosthesis with supervision. Baseline: SEE OBJECTIVE DATA Goal status: MET 10/07/2023  5. Patient negotiates ramps & curbs with RW & prosthesis with minA. Baseline: SEE OBJECTIVE DATA Goal status: MET 10/07/2023  LONG TERM GOALS: Target date: 12/10/2023  Patient & family demonstrate & verbalize understanding of prosthetic care to enable safe utilization of prosthesis. Baseline: SEE OBJECTIVE DATA Goal status: Ongoing 11/30/2023  Patient tolerates prosthesis wear >90% of awake hours without skin or limb pain issues. Baseline: SEE OBJECTIVE DATA Goal status: Ongoing 11/30/2023  Berg Balance >/= 45/56 to indicate lower fall risk with standing ADLs. Baseline: SEE OBJECTIVE DATA Goal status:  Ongoing 11/30/2023  Patient ambulates 200' modified independent with prosthesis & cane. Baseline: SEE OBJECTIVE DATA Goal status:  Ongoing 11/30/2023  Patient negotiates ramps, curbs & stairs with single rail with prosthesis & cane modified independent safely.  Baseline: SEE OBJECTIVE DATA Goal status:  Ongoing 11/30/2023   PLAN:  PT FREQUENCY: 2x/week  PT DURATION: 12 weeks  PLANNED INTERVENTIONS:  97164- PT Re-evaluation, 97110-Therapeutic exercises, 97530- Therapeutic activity, 97112- Neuromuscular re-education, [phone]- Self Care, 96295- Gait training, [phone]- Prosthetic training, Patient/Family education, Balance training, Stair training, DME instructions, and physical performance testing  PLAN FOR NEXT SESSION:   work towards LTGs, check if skin okay with stopping use of under liner,  Balance activities of 4-square stepping & resisted gait.   Vladimir Faster, PT, DPT 11/30/2023, 11:40 AM   Date of referral: 08/31/2023 Referring provider: Aldean Baker, MD Referring diagnosis? K44.010 (ICD-10-CM) - Left below-knee amputee  Treatment diagnosis? (if different than referring diagnosis)  Other abnormalities of gait and mobility  ICD-10-CM: R26.89   Unsteadiness on feet  ICD-10-CM: R26.81   Muscle weakness (generalized)  ICD-10-CM: M62.81   Impaired functional mobility, balance, gait, and endurance  ICD-10-CM: Z74.09   What was this (referring dx) caused by? Surgery (Type: left BKA)  Nature of Condition: Initial Onset (within last 3 months) prosthesis delivery    Laterality: Lt  Current Functional Measure Score: 11/09/2023 Berg Balance 35/56;  at evalation was standing balance supervision static stance 1 min.   Objective measurements identify impairments when they are compared to normal values, the uninvolved extremity, and prior level of function.  [x]  Yes  []  No  Objective assessment of functional ability: Moderate functional limitations   Briefly describe symptoms: Eval was patient is dependent in prosthetic care & use.  He has impaired mobility. Patient is deconditioned from prolonged limited activity with wound, then amputation. 11/11/2023: patient is wearing prosthesis most of awake hours and wound is almost completely healed. Pt has general understanding of prosthetic care.  He is ambulating in home with cane and community with RW.  Previously was w/c bound.   How did symptoms  start: infected wound led to amputation  Average pain intensity:  Last 24 hours: 0  Past week: 0  How often does the pt experience symptoms? Constantly  How much have the symptoms interfered with usual daily activities? Extremely  How has condition changed since care began at this facility? Improved - Better  In general, how is the patients overall health? Good   BACK PAIN (STarT Back Screening Tool) No

## 2023-12-02 ENCOUNTER — Ambulatory Visit: Payer: Medicare Other | Admitting: Physical Therapy

## 2023-12-02 ENCOUNTER — Encounter: Payer: Self-pay | Admitting: Physical Therapy

## 2023-12-02 DIAGNOSIS — R42 Dizziness and giddiness: Secondary | ICD-10-CM | POA: Diagnosis not present

## 2023-12-02 DIAGNOSIS — R2689 Other abnormalities of gait and mobility: Secondary | ICD-10-CM

## 2023-12-02 DIAGNOSIS — M6281 Muscle weakness (generalized): Secondary | ICD-10-CM

## 2023-12-02 DIAGNOSIS — Z7409 Other reduced mobility: Secondary | ICD-10-CM

## 2023-12-02 DIAGNOSIS — R2681 Unsteadiness on feet: Secondary | ICD-10-CM

## 2023-12-02 NOTE — Therapy (Signed)
 OUTPATIENT PHYSICAL THERAPY PROSTHETIC TREATMENT   Patient Name: Justin [name] MRN: [id number] DOB:[birth year], 89 y.o., male Today's Date: 12/02/2023  END OF SESSION:  PT End of Session - 12/02/23 1254     Visit Number 14    Number of Visits 25    Date for PT Re-Evaluation 12/10/23    Authorization Type [employer] Medicare    Authorization Time Period $20 co-pay, $20 COPAY 16 PT VISITS APPROVED 12/18-2/12/25    Authorization - Visit Number 14    Authorization - Number of Visits 16    Progress Note Due on Visit 20    PT Start Time 1300    PT Stop Time 1341    PT Time Calculation (min) 41 min    Equipment Utilized During Treatment Gait belt    Activity Tolerance Patient tolerated treatment well    Behavior During Therapy WFL for tasks assessed/performed                   $20 COPAY 16 PT VISITS APPROVED 12/18-2/12/25   Past Medical History:  Diagnosis Date   Anxiety    Arthritis    Carotid arterial disease (HCC)    CKD (chronic kidney disease)    Coronary artery disease    Diabetes mellitus without complication (HCC)    Diabetic retinopathy (HCC)    NPDR OU   Diverticulitis    Dyspnea    GERD (gastroesophageal reflux disease)    History of kidney stones 2021   Hypercholesteremia    Hypertension    Hypertensive retinopathy    OU   Myocardial infarction [facility]) 2009   Pneumonia    as a child   Prostate cancer (HCC)    UTI (lower urinary tract infection)    Past Surgical History:  Procedure Laterality Date   ABDOMINAL AORTOGRAM W/LOWER EXTREMITY N/A 06/22/2023   Procedure: ABDOMINAL AORTOGRAM W/LOWER EXTREMITY;  Surgeon: Maeola Harman, MD;  Location: [facility] INVASIVE CV LAB;  Service: Cardiovascular;  Laterality: N/A;   ABDOMINAL SURGERY     for diverticulitis; also removed appendix   AMPUTATION Left 06/24/2023   Procedure: LEFT BELOW THE KNEE AMPUTATION;  Surgeon: Nadara Mustard, MD;  Location: [facility] OR;  Service: Orthopedics;  Laterality: Left;    APPENDECTOMY     CARDIAC CATHETERIZATION  2018   CATARACT EXTRACTION     CATARACT EXTRACTION, BILATERAL     CHOLECYSTECTOMY N/A 10/12/2017   Procedure: LAPAROSCOPIC CHOLECYSTECTOMY;  Surgeon: Abigail Miyamoto, MD;  Location: [facility] OR;  Service: General;  Laterality: N/A;   CORONARY ARTERY BYPASS GRAFT  2009   ERCP N/A 12/21/2018   Procedure: ENDOSCOPIC RETROGRADE CHOLANGIOPANCREATOGRAPHY (ERCP);  Surgeon: Jeani Hawking, MD;  Location: [facility] ENDOSCOPY;  Service: Endoscopy;  Laterality: N/A;   ESOPHAGOGASTRODUODENOSCOPY (EGD) WITH PROPOFOL N/A 12/17/2018   Procedure: ESOPHAGOGASTRODUODENOSCOPY (EGD) WITH PROPOFOL;  Surgeon: Jeani Hawking, MD;  Location: [facility] ENDOSCOPY;  Service: Endoscopy;  Laterality: N/A;   EUS Left 12/17/2018   Procedure: UPPER ENDOSCOPIC ULTRASOUND (EUS) LINEAR;  Surgeon: Jeani Hawking, MD;  Location: [facility] ENDOSCOPY;  Service: Endoscopy;  Laterality: Left;   EYE SURGERY     "for bleeding in eye"   HERNIA REPAIR     LEFT HEART CATH AND CORONARY ANGIOGRAPHY N/A 11/04/2016   Procedure: Left Heart Cath and Coronary Angiography;  Surgeon: Yates Decamp, MD;  Location: [facility] INVASIVE CV LAB;  Service: Cardiovascular;  Laterality: N/A;   LOWER EXTREMITY ANGIOGRAPHY N/A 11/04/2016   Procedure: Lower Extremity Angiography;  Surgeon: Yates Decamp, MD;  Location: [facility] INVASIVE CV LAB;  Service: Cardiovascular;  Laterality: N/A;   LOWER EXTREMITY ANGIOGRAPHY N/A 01/03/2020   Procedure: LOWER EXTREMITY ANGIOGRAPHY;  Surgeon: Yates Decamp, MD;  Location: [facility] INVASIVE CV LAB;  Service: Cardiovascular;  Laterality: N/A;   LOWER EXTREMITY ANGIOGRAPHY Bilateral 01/14/2022   Procedure: Lower Extremity Angiography;  Surgeon: Yates Decamp, MD;  Location: [facility] INVASIVE CV LAB;  Service: Cardiovascular;  Laterality: Bilateral;   LUMBAR LAMINECTOMY/DECOMPRESSION MICRODISCECTOMY Bilateral 11/09/2020   Procedure: Laminectomy and Foraminotomy - bilateral - Lumbar Four-Five.;  Surgeon: Julio Sicks, MD;  Location: [facility] OR;  Service:  Neurosurgery;  Laterality: Bilateral;  posterior   PROSTATE SURGERY     REMOVAL OF STONES  12/21/2018   Procedure: REMOVAL OF STONES;  Surgeon: Jeani Hawking, MD;  Location: [facility] ENDOSCOPY;  Service: Endoscopy;;   SPHINCTEROTOMY  12/21/2018   Procedure: Dennison Mascot;  Surgeon: Jeani Hawking, MD;  Location: [facility] ENDOSCOPY;  Service: Endoscopy;;   Patient Active Problem List   Diagnosis Date Noted   Osteomyelitis of great toe of left foot (HCC) 06/20/2023   Gangrene of toe of left foot (HCC) 06/19/2023   Normocytic anemia 06/19/2023   Eschar of left great toe 05/22/2023   Near syncope 06/15/2021   Dizziness 06/15/2021   Lumbar stenosis with neurogenic claudication 11/09/2020   CKD stage 3 due to type 2 diabetes mellitus (HCC) 12/14/2019   Asymptomatic bilateral carotid artery stenosis 12/14/2019   Abdominal distention    CAD s/p CABG in 2009 12/14/2018   Acute renal failure superimposed on stage 3 chronic kidney disease (HCC) 12/14/2018   UTI (urinary tract infection) 12/14/2018   HLD (hyperlipidemia) 12/14/2018   Sepsis (HCC) 12/14/2018   Abnormal LFTs    S/P CABG (coronary artery bypass graft) 12/06/2018   Asymptomatic stenosis of right carotid artery 12/06/2018   Claudication in peripheral vascular disease (HCC) 12/06/2018   Orthostatic hypotension due to diabetic dysautonomia 12/06/2018   Elevated liver enzymes 12/06/2018   Abdominal pain 10/12/2018   Abnormal liver function tests 10/12/2018   Foreign body in stomach 10/12/2018   Primary hypertension    GERD (gastroesophageal reflux disease)    Diabetes mellitus without complication (HCC)    Atherosclerosis of native coronary artery of native heart without angina pectoris     PCP: Eloisa Northern, MD   REFERRING PROVIDER: Nadara Mustard, MD  ONSET DATE: 09/14/2023 Prosthesis delivery  REFERRING DIAG: E95.284 (ICD-10-CM) - Left below-knee amputee   THERAPY DIAG:  Other abnormalities of gait and mobility  Unsteadiness on  feet  Dizziness and giddiness  Muscle weakness (generalized)  Impaired functional mobility, balance, gait, and endurance  Rationale for Evaluation and Treatment: Rehabilitation  SUBJECTIVE:   SUBJECTIVE STATEMENT: He has been wearing prosthesis without under liner as PT recommended with no issues.  No soreness from activities in PT last session.   PERTINENT HISTORY: Left TTA 06/24/23, DM2, retinopathy, GERD, HLD, HTN, CAD, CABG 2009, PAD, prostate CA, CKD stage 3b, arthritis, Laminectomy & Foraminotomy bilateral L4-5,   PAIN:  Are you having pain? No, 0/10 pain.  PRECAUTIONS: Fall  WEIGHT BEARING RESTRICTIONS: No  FALLS: Has patient fallen in last 6 months? Yes. Number of falls 1 no injuries  LIVING ENVIRONMENT: Lives with: lives with their family Lives in: House Home Access: Ramped entrance and single step Home layout: Two level, 1/2 bath on main level, and bedroom Stairs: Yes: Internal: 14 steps; on left going up and can reach both and External: 1+1 steps; none Has following equipment at home: [employer]  cane small base, Walker - 2 wheeled, Wheelchair (manual), shower chair, Shower bench, and bed side commode  OCCUPATION: retired from [employer]  PLOF: Independent used cane & walker   PATIENT GOALS: to use prosthesis to walk in home & community, family wants to be safe,  go upstairs to shower  OBJECTIVE:   POSTURE: Evaluation on 09/16/2023:  rounded shoulders, forward head, flexed trunk , and weight shift right  LOWER EXTREMITY ROM:  ROM P:passive  A:active Right 09/24/23 Left 09/24/23  Hip flexion    Hip extension Maisie Fus -5* Thomas -11*  Hip abduction    Hip adduction    Hip internal rotation    Hip external rotation    Knee flexion    Knee extension  Supine P: -7*  Ankle dorsiflexion    Ankle plantarflexion    Ankle inversion    Ankle eversion     (Blank rows = not tested)  LOWER EXTREMITY MMT:  MMT Left 09/24/23  Hip flexion   Hip extension  Sidelying 3-/5  Hip abduction Sidleying 3/5  Hip adduction   Hip internal rotation   Hip external rotation   Knee flexion   Knee extension 3-/5  Ankle dorsiflexion   Ankle plantarflexion   Ankle inversion   Ankle eversion   At Evaluation all strength testing is grossly seated and functionally standing / gait. (Blank rows = not tested)  TRANSFERS: Evaluation on 09/16/2023:  Sit to stand: SBA 20" w/c with armrest to RW limited use of LLE Stand to sit: SBA RW to 20" w/c with armrest limited use of LLE  FUNCTIONAL TESTs:  11/09/2023 Sharlene Motts: 35/56     TUG trial 1: 38sec 2 stumbles with pt able to correct without therapist intervention, trial 2: 34sec no stumbles, MinA to CGA  09/24/2023:  Patient able to stand up from 20" w/c with armrest with RW for stabilization with supervision.  Patient able to maintain upright with RW support without assistance.  Patient able to balance without upper extremity support with minA for 60 seconds.  Deferred at evaluation on 09/16/2023 as limb too edematous to don prosthesis  GAIT: 11/17/2023: Ambulated with stand alone cane CGA with bouts of SBA, step through pattern with inconsistent gait cadence. Was also able to walk short distances without AD and CGA with vc to slow gait down for less instances of tripping.  11/09/2023:  Self selected gait speed: 1.46 ft/sec with stand alone cane, MinA  09/24/2023:   Patient ambulated 50 feet with RW with CGA.  Initially patient walking with a step to pattern with minimal weight shift onto prosthesis.   Deferred at evaluation on 09/16/2023 as limb too edematous to don prosthesis Gait pattern:  Distance walked:  Assistive device utilized:  Level of assistance:  Comments:   CURRENT PROSTHETIC WEAR ASSESSMENT: 11/11/2023: Patient is wearing prosthesis all awake hours and checking / drying or switching Vivewear under liner every 5 hours.  Wound has one superficial scab 2mm & 3mm at distal portion. Remainder  is healed.     Evaluation on 09/16/2023:  Patient is dependent with: skin check, residual limb care, care of non-amputated limb, prosthetic cleaning, ply sock cleaning, correct ply sock adjustment, proper wear schedule/adjustment, and proper weight-bearing schedule/adjustment Donning prosthesis: Max A Doffing prosthesis: Min A Prosthetic wear tolerance: 30-45 min 2 days ago when prosthesis delivered.  Prosthetic weight bearing tolerance: 3 minutes partial weight bearing trying to engage pin suspension.  Pt reports distal lateral limb pain from wear 2 days ago.  Edema: pitting edema  Residual limb condition: scab dry over tibial crest 5cm wide X 15 cm long.  Dry skin, normal color & temperature. Cylindrical shape Prosthetic description: silicon liner with anterior portion 9mm thick, secondary pelite foam liner, total contact socket,     TODAY'S TREATMENT:                                                                                                                             DATE:  12/02/2023: Prosthetic Training with left Transtibial Amputation: Pt amb 100' in clinic area without device with supervision scanning right/left.   PT demo & verbal cues on stepping over obstacle.  Pt able to step over 4" half foam roller with cane with CGA & cues.   PT demo & verbal cues on picking up item.  Pt able to lift 10# kettle bell from floor and carry it with cane 8' 3 reps with PT CGA.  Figure 8 gait with cane working on turning around obstacle.   Neuromuscular Re-education:  4-square stepping over taped lines on floor without device with minA for balance 2 reps CW & CCW. Progressed to 4-square stepping over 1" pipes with maxA 2x for loss of balance when stepping backwards.  Along counter for safety: backwards and side stepping with intermittent touch 3 laps;  braiding with BUE support 3 laps.  Ambulating outward powering over resistance and return controlling stepping against resistance 5# anterior,  posterior, to right & to left 3 reps ea. PT minA/CGA for safety & tactile cues.  Pt improved with instruction & reps.    TREATMENT:                                                                                                                             DATE:  11/30/2023: Prosthetic Training with left Transtibial Amputation: Wound has completely healed.  PT recommended stopping use of Vivewear under liner and checking skin 2x/day. Pt & dtr verbalized understanding.  Pt amb 100' without device with supervision working on scanning right/left. Pt neg 12* ramp & 6.5" curb with cane with CGA. Pt improved with less assistance since last session.  Pt neg stairs with 2 rails 11 steps: descending step-to pattern alternating lead LE with each step; ascending alternating pattern. PT demo & verbal cues on technique.    Neuromuscular Re-education:  4-square stepping without device with minA for balance 3 reps CW & CCW.  Ambulating outward powering over resistance and return controlling stepping against resistance 5# anterior, posterior, to right & to left 3 reps ea. PT minA/CGA for safety & tactile cues.  Pt improved with instruction & reps.      TREATMENT:                                                                                                                             DATE:  11/17/2023: Prosthetic Training with left Transtibial Amputation: PT educated pt & dtr in higher fall risk when prosthesis is off as he is subconsciously trusting prosthesis at this point. So he may move without thinking about fact that prosthesis is not there. Pt verbalized understanding.   Patient's wound now only has one superficial dry scab at proximal portion of scar on tibial crest.   Patient ambulated with stand alone cane around the clinic while searching the area for 11 cones. Able to navigate around people and objects with intermittent vc to scan room for cones. Patient able to reach outside BOS using cane for  support. 2 instances of tripping due to inadequate step and bend of knee requiring minA for patient to maintain upright.  Patient carried 5 varied weights (1.5-4lb) individually from mat to bench (~31ft) with cane and CGA. Required to navigate around people and performed reaching outside BOS without assist from mat table to retrieve weight. Able to remain upright and with good cadence when ambulating with weight from mat to bench.  Patient ambulated 68' X 3 with CGA in a circle without use of AD, required heavy vc and tc for proper arm swing to promote weight shifting. Patient tends to veer to R when ambulating, attention called to veering with patient only briefly being able to rectify.     PATIENT EDUCATION: PATIENT EDUCATED ON FOLLOWING PROSTHETIC CARE: Education details:   Skin check, Residual limb care, Prosthetic cleaning, Correct ply sock adjustment, Propper donning, and Proper wear schedule/adjustment Prosthetic wear tolerance: liner only initially 2 hours 3x/day, 7 days/week Person educated: Patient and Child(ren) Education method: Explanation, Demonstration, Tactile cues, and Verbal cues Education comprehension: dtr & granddaughter verbalized understanding, verbal cues required, tactile cues required, and needs further education  HOME EXERCISE PROGRAM:  ASSESSMENT:  CLINICAL IMPRESSION: Patient improves balance with functional tasks after instruction and repetition. PT requested pt & family to discuss if they feel he is ready for discharge or there are more things that they want to work on.   Patient will continue to benefit from continued skilled physical therapy to address deficits.    OBJECTIVE IMPAIRMENTS: Abnormal gait, decreased activity tolerance, decreased balance, decreased endurance, decreased knowledge of condition, decreased knowledge of use of DME, decreased mobility, difficulty walking, decreased ROM, decreased strength, increased edema, prosthetic dependency , and pain.    ACTIVITY LIMITATIONS: carrying, lifting, sitting, standing, stairs, transfers, and locomotion level  PARTICIPATION LIMITATIONS: standing ADLS, cleaning, community activity, and household activity  PERSONAL FACTORS: Age, Fitness, Past/current experiences,  Time since onset of injury/illness/exacerbation, and 3+ comorbidities: see PMH  are also affecting patient's functional outcome.   REHAB POTENTIAL: Good  CLINICAL DECISION MAKING: Evolving/moderate complexity  EVALUATION COMPLEXITY: Moderate   GOALS: Goals reviewed with patient? Yes  SHORT TERM GOALS: Target date: 10/15/2023  Patient donnes prosthesis modified independent & family verbalizes proper cleaning. Baseline: SEE OBJECTIVE DATA Goal status: MET 10/07/2023 2.  Patient tolerates prosthesis >8 hrs total /day without skin issues increasing or limb pain >4/10 after standing. Baseline: SEE OBJECTIVE DATA Goal status: MET 10/07/2023  3.  Patient able to reach 7" and look over both shoulders with RW support with supervision. Baseline: SEE OBJECTIVE DATA Goal status: MET 10/14/2023  4. Patient ambulates 23' with RW & prosthesis with supervision. Baseline: SEE OBJECTIVE DATA Goal status: MET 10/07/2023  5. Patient negotiates ramps & curbs with RW & prosthesis with minA. Baseline: SEE OBJECTIVE DATA Goal status: MET 10/07/2023  LONG TERM GOALS: Target date: 12/10/2023  Patient & family demonstrate & verbalize understanding of prosthetic care to enable safe utilization of prosthesis. Baseline: SEE OBJECTIVE DATA Goal status: Ongoing 11/30/2023  Patient tolerates prosthesis wear >90% of awake hours without skin or limb pain issues. Baseline: SEE OBJECTIVE DATA Goal status: Ongoing 11/30/2023  Berg Balance >/= 45/56 to indicate lower fall risk with standing ADLs. Baseline: SEE OBJECTIVE DATA Goal status:  Ongoing 11/30/2023  Patient ambulates 200' modified independent with prosthesis & cane. Baseline: SEE OBJECTIVE DATA Goal  status:  Ongoing 11/30/2023  Patient negotiates ramps, curbs & stairs with single rail with prosthesis & cane modified independent safely.  Baseline: SEE OBJECTIVE DATA Goal status:  Ongoing 11/30/2023   PLAN:  PT FREQUENCY: 2x/week  PT DURATION: 12 weeks  PLANNED INTERVENTIONS: 97164- PT Re-evaluation, 97110-Therapeutic exercises, 97530- Therapeutic activity, 97112- Neuromuscular re-education, [phone]- Self Care, 52841- Gait training, [phone]- Prosthetic training, Patient/Family education, Balance training, Stair training, DME instructions, and physical performance testing  PLAN FOR NEXT SESSION: begin to assess LTGs, Balance activities of 4-square stepping & resisted gait.   Vladimir Faster, PT, DPT 12/02/2023, 1:51 PM   Date of referral: 08/31/2023 Referring provider: Aldean Baker, MD Referring diagnosis? N02.725 (ICD-10-CM) - Left below-knee amputee  Treatment diagnosis? (if different than referring diagnosis)  Other abnormalities of gait and mobility  ICD-10-CM: R26.89   Unsteadiness on feet  ICD-10-CM: R26.81   Muscle weakness (generalized)  ICD-10-CM: M62.81   Impaired functional mobility, balance, gait, and endurance  ICD-10-CM: Z74.09   What was this (referring dx) caused by? Surgery (Type: left BKA)  Nature of Condition: Initial Onset (within last 3 months) prosthesis delivery    Laterality: Lt  Current Functional Measure Score: 11/09/2023 Berg Balance 35/56;  at evalation was standing balance supervision static stance 1 min.   Objective measurements identify impairments when they are compared to normal values, the uninvolved extremity, and prior level of function.  [x]  Yes  []  No  Objective assessment of functional ability: Moderate functional limitations   Briefly describe symptoms: Eval was patient is dependent in prosthetic care & use.  He has impaired mobility. Patient is deconditioned from prolonged limited activity with wound, then amputation. 11/11/2023: patient is  wearing prosthesis most of awake hours and wound is almost completely healed. Pt has general understanding of prosthetic care.  He is ambulating in home with cane and community with RW.  Previously was w/c bound.   How did symptoms start: infected wound led to amputation  Average pain intensity:  Last 24 hours: 0  Past  week: 0  How often does the pt experience symptoms? Constantly  How much have the symptoms interfered with usual daily activities? Extremely  How has condition changed since care began at this facility? Improved - Better  In general, how is the patients overall health? Good   BACK PAIN (STarT Back Screening Tool) No

## 2023-12-04 ENCOUNTER — Other Ambulatory Visit: Payer: Self-pay | Admitting: Cardiology

## 2023-12-04 DIAGNOSIS — I10 Essential (primary) hypertension: Secondary | ICD-10-CM

## 2023-12-08 ENCOUNTER — Encounter: Payer: Self-pay | Admitting: Physical Therapy

## 2023-12-08 ENCOUNTER — Ambulatory Visit: Payer: Medicare Other | Admitting: Physical Therapy

## 2023-12-08 DIAGNOSIS — R42 Dizziness and giddiness: Secondary | ICD-10-CM

## 2023-12-08 DIAGNOSIS — M6281 Muscle weakness (generalized): Secondary | ICD-10-CM

## 2023-12-08 DIAGNOSIS — Z7409 Other reduced mobility: Secondary | ICD-10-CM

## 2023-12-08 DIAGNOSIS — R2689 Other abnormalities of gait and mobility: Secondary | ICD-10-CM | POA: Diagnosis not present

## 2023-12-08 DIAGNOSIS — R2681 Unsteadiness on feet: Secondary | ICD-10-CM | POA: Diagnosis not present

## 2023-12-08 NOTE — Therapy (Signed)
 OUTPATIENT PHYSICAL THERAPY PROSTHETIC TREATMENT   Patient Name: Justin [name] MRN: [id number] DOB:09-27-1933, 89 y.o., male Today's Date: 12/08/2023  END OF SESSION:  PT End of Session - 12/08/23 1258     Visit Number 15    Number of Visits 25    Date for PT Re-Evaluation 12/10/23    Authorization Type [employer] Medicare    Authorization Time Period $20 co-pay, $20 COPAY 16 PT VISITS APPROVED 12/18-2/12/25    Authorization - Visit Number 15    Authorization - Number of Visits 16    Progress Note Due on Visit 20    PT Start Time 1259    PT Stop Time 1341    PT Time Calculation (min) 42 min    Equipment Utilized During Treatment Gait belt    Activity Tolerance Patient tolerated treatment well    Behavior During Therapy WFL for tasks assessed/performed                    $20 COPAY 16 PT VISITS APPROVED 12/18-2/12/25   Past Medical History:  Diagnosis Date   Anxiety    Arthritis    Carotid arterial disease (HCC)    CKD (chronic kidney disease)    Coronary artery disease    Diabetes mellitus without complication (HCC)    Diabetic retinopathy (HCC)    NPDR OU   Diverticulitis    Dyspnea    GERD (gastroesophageal reflux disease)    History of kidney stones 2021   Hypercholesteremia    Hypertension    Hypertensive retinopathy    OU   Myocardial infarction [facility]) 2009   Pneumonia    as a child   Prostate cancer (HCC)    UTI (lower urinary tract infection)    Past Surgical History:  Procedure Laterality Date   ABDOMINAL AORTOGRAM W/LOWER EXTREMITY N/A 06/22/2023   Procedure: ABDOMINAL AORTOGRAM W/LOWER EXTREMITY;  Surgeon: Maeola Harman, MD;  Location: [facility] INVASIVE CV LAB;  Service: Cardiovascular;  Laterality: N/A;   ABDOMINAL SURGERY     for diverticulitis; also removed appendix   AMPUTATION Left 06/24/2023   Procedure: LEFT BELOW THE KNEE AMPUTATION;  Surgeon: Nadara Mustard, MD;  Location: [facility] OR;  Service: Orthopedics;  Laterality: Left;    APPENDECTOMY     CARDIAC CATHETERIZATION  2018   CATARACT EXTRACTION     CATARACT EXTRACTION, BILATERAL     CHOLECYSTECTOMY N/A 10/12/2017   Procedure: LAPAROSCOPIC CHOLECYSTECTOMY;  Surgeon: Abigail Miyamoto, MD;  Location: [facility] OR;  Service: General;  Laterality: N/A;   CORONARY ARTERY BYPASS GRAFT  2009   ERCP N/A 12/21/2018   Procedure: ENDOSCOPIC RETROGRADE CHOLANGIOPANCREATOGRAPHY (ERCP);  Surgeon: Jeani Hawking, MD;  Location: [facility] ENDOSCOPY;  Service: Endoscopy;  Laterality: N/A;   ESOPHAGOGASTRODUODENOSCOPY (EGD) WITH PROPOFOL N/A 12/17/2018   Procedure: ESOPHAGOGASTRODUODENOSCOPY (EGD) WITH PROPOFOL;  Surgeon: Jeani Hawking, MD;  Location: [facility] ENDOSCOPY;  Service: Endoscopy;  Laterality: N/A;   EUS Left 12/17/2018   Procedure: UPPER ENDOSCOPIC ULTRASOUND (EUS) LINEAR;  Surgeon: Jeani Hawking, MD;  Location: [facility] ENDOSCOPY;  Service: Endoscopy;  Laterality: Left;   EYE SURGERY     "for bleeding in eye"   HERNIA REPAIR     LEFT HEART CATH AND CORONARY ANGIOGRAPHY N/A 11/04/2016   Procedure: Left Heart Cath and Coronary Angiography;  Surgeon: Yates Decamp, MD;  Location: [facility] INVASIVE CV LAB;  Service: Cardiovascular;  Laterality: N/A;   LOWER EXTREMITY ANGIOGRAPHY N/A 11/04/2016   Procedure: Lower Extremity Angiography;  Surgeon: Yates Decamp,  MD;  Location: [facility] INVASIVE CV LAB;  Service: Cardiovascular;  Laterality: N/A;   LOWER EXTREMITY ANGIOGRAPHY N/A 01/03/2020   Procedure: LOWER EXTREMITY ANGIOGRAPHY;  Surgeon: Yates Decamp, MD;  Location: [facility] INVASIVE CV LAB;  Service: Cardiovascular;  Laterality: N/A;   LOWER EXTREMITY ANGIOGRAPHY Bilateral 01/14/2022   Procedure: Lower Extremity Angiography;  Surgeon: Yates Decamp, MD;  Location: [facility] INVASIVE CV LAB;  Service: Cardiovascular;  Laterality: Bilateral;   LUMBAR LAMINECTOMY/DECOMPRESSION MICRODISCECTOMY Bilateral 11/09/2020   Procedure: Laminectomy and Foraminotomy - bilateral - Lumbar Four-Five.;  Surgeon: Julio Sicks, MD;  Location: [facility] OR;  Service:  Neurosurgery;  Laterality: Bilateral;  posterior   PROSTATE SURGERY     REMOVAL OF STONES  12/21/2018   Procedure: REMOVAL OF STONES;  Surgeon: Jeani Hawking, MD;  Location: [facility] ENDOSCOPY;  Service: Endoscopy;;   SPHINCTEROTOMY  12/21/2018   Procedure: Dennison Mascot;  Surgeon: Jeani Hawking, MD;  Location: [facility] ENDOSCOPY;  Service: Endoscopy;;   Patient Active Problem List   Diagnosis Date Noted   Osteomyelitis of great toe of left foot (HCC) 06/20/2023   Gangrene of toe of left foot (HCC) 06/19/2023   Normocytic anemia 06/19/2023   Eschar of left great toe 05/22/2023   Near syncope 06/15/2021   Dizziness 06/15/2021   Lumbar stenosis with neurogenic claudication 11/09/2020   CKD stage 3 due to type 2 diabetes mellitus (HCC) 12/14/2019   Asymptomatic bilateral carotid artery stenosis 12/14/2019   Abdominal distention    CAD s/p CABG in 2009 12/14/2018   Acute renal failure superimposed on stage 3 chronic kidney disease (HCC) 12/14/2018   UTI (urinary tract infection) 12/14/2018   HLD (hyperlipidemia) 12/14/2018   Sepsis (HCC) 12/14/2018   Abnormal LFTs    S/P CABG (coronary artery bypass graft) 12/06/2018   Asymptomatic stenosis of right carotid artery 12/06/2018   Claudication in peripheral vascular disease (HCC) 12/06/2018   Orthostatic hypotension due to diabetic dysautonomia 12/06/2018   Elevated liver enzymes 12/06/2018   Abdominal pain 10/12/2018   Abnormal liver function tests 10/12/2018   Foreign body in stomach 10/12/2018   Primary hypertension    GERD (gastroesophageal reflux disease)    Diabetes mellitus without complication (HCC)    Atherosclerosis of native coronary artery of native heart without angina pectoris     PCP: Eloisa Northern, MD   REFERRING PROVIDER: Nadara Mustard, MD  ONSET DATE: 09/14/2023 Prosthesis delivery  REFERRING DIAG: V78.469 (ICD-10-CM) - Left below-knee amputee   THERAPY DIAG:  Other abnormalities of gait and mobility  Unsteadiness on  feet  Dizziness and giddiness  Muscle weakness (generalized)  Impaired functional mobility, balance, gait, and endurance  Rationale for Evaluation and Treatment: Rehabilitation  SUBJECTIVE:   SUBJECTIVE STATEMENT: He has not gone upstairs to shower yet.  He has been wearing prosthesis daily for most of awake hours without Vivewear under liner sock.    PERTINENT HISTORY: Left TTA 06/24/23, DM2, retinopathy, GERD, HLD, HTN, CAD, CABG 2009, PAD, prostate CA, CKD stage 3b, arthritis, Laminectomy & Foraminotomy bilateral L4-5,   PAIN:  Are you having pain? No, 0/10 pain.  PRECAUTIONS: Fall  WEIGHT BEARING RESTRICTIONS: No  FALLS: Has patient fallen in last 6 months? Yes. Number of falls 1 no injuries  LIVING ENVIRONMENT: Lives with: lives with their family Lives in: House Home Access: Ramped entrance and single step Home layout: Two level, 1/2 bath on main level, and bedroom Stairs: Yes: Internal: 14 steps; on left going up and can reach both and External: 1+1 steps; none Has  following equipment at home: Counselling psychologist, Environmental consultant - 2 wheeled, Wheelchair (manual), shower chair, Shower bench, and bed side commode  OCCUPATION: retired from [employer]  PLOF: Independent used cane & walker   PATIENT GOALS: to use prosthesis to walk in home & community, family wants to be safe,  go upstairs to shower  OBJECTIVE:   POSTURE: Evaluation on 09/16/2023:  rounded shoulders, forward head, flexed trunk , and weight shift right  LOWER EXTREMITY ROM:  ROM P:passive  A:active Right 09/24/23 Left 09/24/23  Hip flexion    Hip extension Maisie Fus -5* Thomas -11*  Hip abduction    Hip adduction    Hip internal rotation    Hip external rotation    Knee flexion    Knee extension  Supine P: -7*  Ankle dorsiflexion    Ankle plantarflexion    Ankle inversion    Ankle eversion     (Blank rows = not tested)  LOWER EXTREMITY MMT:  MMT Left 09/24/23  Hip flexion   Hip extension  Sidelying 3-/5  Hip abduction Sidleying 3/5  Hip adduction   Hip internal rotation   Hip external rotation   Knee flexion   Knee extension 3-/5  Ankle dorsiflexion   Ankle plantarflexion   Ankle inversion   Ankle eversion   At Evaluation all strength testing is grossly seated and functionally standing / gait. (Blank rows = not tested)  TRANSFERS: Evaluation on 09/16/2023:  Sit to stand: SBA 20" w/c with armrest to RW limited use of LLE Stand to sit: SBA RW to 20" w/c with armrest limited use of LLE  FUNCTIONAL TESTs:  12/08/2023: Sharlene Motts Balance 39/56 TUG: no device standard 29.28 sec;  with cane std 25.68 sec and cognitive 39.75 sec naming states (stopped naming during turns & repeated 2 states) 4-square step test with cane:  30.68   cane & TTA prosthesis, modA for backward step & minA all other directions  32.62   cane & TTA prosthesis, modA for backward step & minA all other directions    [facility] PT Assessment - 12/08/23 1307       Standardized Balance Assessment   Standardized Balance Assessment Berg Balance Test;Timed Up and Go Test;Four Square Step Test      Berg Balance Test   Sit to Stand Able to stand  independently using hands    Standing Unsupported Able to stand safely 2 minutes    Sitting with Back Unsupported but Feet Supported on Floor or Stool Able to sit safely and securely 2 minutes    Stand to Sit Sits safely with minimal use of hands    Transfers Able to transfer safely, minor use of hands    Standing Unsupported with Eyes Closed Able to stand 10 seconds safely    Standing Unsupported with Feet Together Needs help to attain position but able to stand for 30 seconds with feet together    From Standing, Reach Forward with Outstretched Arm Can reach confidently >25 cm (10")    From Standing Position, Pick up Object from Floor Able to pick up shoe safely and easily    From Standing Position, Turn to Look Behind Over each Shoulder Looks behind one side only/other  side shows less weight shift    Turn 360 Degrees Able to turn 360 degrees safely but slowly    Standing Unsupported, Alternately Place Feet on Step/Stool Able to complete >2 steps/needs minimal assist    Standing Unsupported, One Foot in Front Needs help  to step but can hold 15 seconds    Standing on One Leg Unable to try or needs assist to prevent fall    Total Score 39    Berg comment: BERG  < 36 high risk for falls (close to 100%) 46-51 moderate (>50%)   37-45 significant (>80%) 52-55 lower (> 25%)      Timed Up and Go Test   Normal TUG (seconds) 29.28   25.68 sec with cane & TTA prosthesis, 29.28 sec no device   Cognitive TUG (seconds) 39.75   39.75 sec with cane & TTA prosthesis naming states,     Four Square Step Test    Trial One  30.68   cane & TTA prosthesis, modA for backward step & minA all other directions   Trial Two 32.62   cane & TTA prosthesis, modA for backward step & minA all other directions     Functional Gait  Assessment   Gait assessed  Yes    Gait Level Surface Walks 20 ft, slow speed, abnormal gait pattern, evidence for imbalance or deviates 10-15 in outside of the 12 in walkway width. Requires more than 7 sec to ambulate 20 ft.    Change in Gait Speed Makes only minor adjustments to walking speed, or accomplishes a change in speed with significant gait deviations, deviates 10-15 in outside the 12 in walkway width, or changes speed but loses balance but is able to recover and continue walking.    Gait with Horizontal Head Turns Performs head turns with moderate changes in gait velocity, slows down, deviates 10-15 in outside 12 in walkway width but recovers, can continue to walk.    Gait with Vertical Head Turns Performs task with severe disruption of gait (eg, staggers 15 in outside 12 in walkway width, loses balance, stops, reaches for wall).    Gait and Pivot Turn Turns slowly, requires verbal cueing, or requires several small steps to catch balance following turn and  stop    Step Over Obstacle Cannot perform without assistance.    Gait with Narrow Base of Support Ambulates less than 4 steps heel to toe or cannot perform without assistance.    Gait with Eyes Closed Cannot walk 20 ft without assistance, severe gait deviations or imbalance, deviates greater than 15 in outside 12 in walkway width or will not attempt task.    Ambulating Backwards Cannot walk 20 ft without assistance, severe gait deviations or imbalance, deviates greater than 15 in outside 12 in walkway width or will not attempt task.    Steps Two feet to a stair, must use rail.    Total Score 5    FGA comment: cane stand alone tip & TTA prosthesis              11/09/2023 Berg: 35/56   TUG trial 1: 38sec 2 stumbles with pt able to correct without therapist intervention, trial 2: 34sec no stumbles, MinA to CGA  09/24/2023:  Patient able to stand up from 20" w/c with armrest with RW for stabilization with supervision.  Patient able to maintain upright with RW support without assistance.  Patient able to balance without upper extremity support with minA for 60 seconds.  Deferred at evaluation on 09/16/2023 as limb too edematous to don prosthesis  GAIT: 12/08/2023: Functional Gait Assessment with cane: 5/30 indicating high fall risk.  Gait velocity:  with TTA prosthesis & cane stand alone tip comfortable /self-selected 1.66 ft/sec with SBA and fast pace 2.26 ft/sec with minA.  11/17/2023: Ambulated with stand alone cane CGA with bouts of SBA, step through pattern with inconsistent gait cadence. Was also able to walk short distances without AD and CGA with vc to slow gait down for less instances of tripping.  11/09/2023:  Self selected gait speed: 1.46 ft/sec with stand alone cane, MinA  09/24/2023:   Patient ambulated 50 feet with RW with CGA.  Initially patient walking with a step to pattern with minimal weight shift onto prosthesis.   Deferred at evaluation on 09/16/2023 as limb too  edematous to don prosthesis Gait pattern:  Distance walked:  Assistive device utilized:  Level of assistance:  Comments:   CURRENT PROSTHETIC WEAR ASSESSMENT: 12/08/2023: Pt tolerates wear >90% of awake hours with no skin issues (larger wound that he initially had on tibial crest is completely healed) and reports no residual limb pain.  Pt & dtr verbalize proper prosthetic care with skin check, residual limb care, care of non-amputated limb, prosthetic cleaning, ply sock cleaning, correct ply sock adjustment, proper wear schedule/adjustment, and proper weight-bearing schedule/adjustment.  He requires cues to problem solving new prosthetic issues.    11/11/2023: Patient is wearing prosthesis all awake hours and checking / drying or switching Vivewear under liner every 5 hours.  Wound has one superficial scab 2mm & 3mm at distal portion. Remainder is healed.     Evaluation on 09/16/2023:  Patient is dependent with: skin check, residual limb care, care of non-amputated limb, prosthetic cleaning, ply sock cleaning, correct ply sock adjustment, proper wear schedule/adjustment, and proper weight-bearing schedule/adjustment Donning prosthesis: Max A Doffing prosthesis: Min A Prosthetic wear tolerance: 30-45 min 2 days ago when prosthesis delivered.  Prosthetic weight bearing tolerance: 3 minutes partial weight bearing trying to engage pin suspension.  Pt reports distal lateral limb pain from wear 2 days ago.  Edema: pitting edema  Residual limb condition: scab dry over tibial crest 5cm wide X 15 cm long.  Dry skin, normal color & temperature. Cylindrical shape Prosthetic description: silicon liner with anterior portion 9mm thick, secondary pelite foam liner, total contact socket,     TODAY'S TREATMENT:                                                                                                                             DATE:  12/08/2023: Prosthetic Training with left Transtibial  Amputation: See prosthetic care section & gait section of objective data.   Physical Performance Testing: See objective data    TREATMENT:  DATE:  12/02/2023: Prosthetic Training with left Transtibial Amputation: Pt amb 100' in clinic area without device with supervision scanning right/left.   PT demo & verbal cues on stepping over obstacle.  Pt able to step over 4" half foam roller with cane with CGA & cues.   PT demo & verbal cues on picking up item.  Pt able to lift 10# kettle bell from floor and carry it with cane 8' 3 reps with PT CGA.  Figure 8 gait with cane working on turning around obstacle.   Neuromuscular Re-education:  4-square stepping over taped lines on floor without device with minA for balance 2 reps CW & CCW. Progressed to 4-square stepping over 1" pipes with maxA 2x for loss of balance when stepping backwards.  Along counter for safety: backwards and side stepping with intermittent touch 3 laps;  braiding with BUE support 3 laps.  Ambulating outward powering over resistance and return controlling stepping against resistance 5# anterior, posterior, to right & to left 3 reps ea. PT minA/CGA for safety & tactile cues.  Pt improved with instruction & reps.    TREATMENT:                                                                                                                             DATE:  11/30/2023: Prosthetic Training with left Transtibial Amputation: Wound has completely healed.  PT recommended stopping use of Vivewear under liner and checking skin 2x/day. Pt & dtr verbalized understanding.  Pt amb 100' without device with supervision working on scanning right/left. Pt neg 12* ramp & 6.5" curb with cane with CGA. Pt improved with less assistance since last session.  Pt neg stairs with 2 rails 11 steps: descending step-to pattern alternating lead  LE with each step; ascending alternating pattern. PT demo & verbal cues on technique.    Neuromuscular Re-education:  4-square stepping without device with minA for balance 3 reps CW & CCW.  Ambulating outward powering over resistance and return controlling stepping against resistance 5# anterior, posterior, to right & to left 3 reps ea. PT minA/CGA for safety & tactile cues.  Pt improved with instruction & reps.     PATIENT EDUCATION: PATIENT EDUCATED ON FOLLOWING PROSTHETIC CARE: Education details:   Skin check, Residual limb care, Prosthetic cleaning, Correct ply sock adjustment, Propper donning, and Proper wear schedule/adjustment Prosthetic wear tolerance: liner only initially 2 hours 3x/day, 7 days/week Person educated: Patient and Child(ren) Education method: Explanation, Demonstration, Tactile cues, and Verbal cues Education comprehension: dtr & granddaughter verbalized understanding, verbal cues required, tactile cues required, and needs further education  HOME EXERCISE PROGRAM:  ASSESSMENT:  CLINICAL IMPRESSION: Patient has made significant progress with PT. Functional Outcome Test are placing him at high fall risk.    Patient will continue to benefit from continued skilled physical therapy to address deficits.    OBJECTIVE IMPAIRMENTS: Abnormal gait, decreased activity tolerance, decreased balance, decreased endurance, decreased knowledge of condition, decreased  knowledge of use of DME, decreased mobility, difficulty walking, decreased ROM, decreased strength, increased edema, prosthetic dependency , and pain.   ACTIVITY LIMITATIONS: carrying, lifting, sitting, standing, stairs, transfers, and locomotion level  PARTICIPATION LIMITATIONS: standing ADLS, cleaning, community activity, and household activity  PERSONAL FACTORS: Age, Fitness, Past/current experiences, Time since onset of injury/illness/exacerbation, and 3+ comorbidities: see PMH  are also affecting patient's  functional outcome.   REHAB POTENTIAL: Good  CLINICAL DECISION MAKING: Evolving/moderate complexity  EVALUATION COMPLEXITY: Moderate   GOALS: Goals reviewed with patient? Yes  SHORT TERM GOALS: Target date: 10/15/2023  Patient donnes prosthesis modified independent & family verbalizes proper cleaning. Baseline: SEE OBJECTIVE DATA Goal status: MET 10/07/2023 2.  Patient tolerates prosthesis >8 hrs total /day without skin issues increasing or limb pain >4/10 after standing. Baseline: SEE OBJECTIVE DATA Goal status: MET 10/07/2023  3.  Patient able to reach 7" and look over both shoulders with RW support with supervision. Baseline: SEE OBJECTIVE DATA Goal status: MET 10/14/2023  4. Patient ambulates 106' with RW & prosthesis with supervision. Baseline: SEE OBJECTIVE DATA Goal status: MET 10/07/2023  5. Patient negotiates ramps & curbs with RW & prosthesis with minA. Baseline: SEE OBJECTIVE DATA Goal status: MET 10/07/2023  LONG TERM GOALS: Target date: 12/10/2023  Patient & family demonstrate & verbalize understanding of prosthetic care to enable safe utilization of prosthesis. Baseline: SEE OBJECTIVE DATA Goal status: MET 12/08/2023  Patient tolerates prosthesis wear >90% of awake hours without skin or limb pain issues. Baseline: SEE OBJECTIVE DATA Goal status: MET 12/08/2023  Initial balance goal at evaluation was Task of [employer] with RW support >/= 30/56 to indicate lower fall risk with standing ADLs.  MET Pt exceeded this LTG so PT set higher level goal  Berg Balance >/= 45/56 to indicate lower fall risk with standing ADLs.  Baseline: SEE OBJECTIVE DATA Goal status:  NOT MET  12/08/2023  5.  Patient ambulates 81' modified independent &  >200' family supervision with prosthesis & RW.  MET 12/08/2023  6.  Patient ambulates 200' modified independent with prosthesis & cane. Baseline: SEE OBJECTIVE DATA Goal status:  Ongoing   12/08/2023  7.  Patient negotiates ramps, curbs &  stairs with single rail with prosthesis & RW with family assistance safely.  Baseline: SEE OBJECTIVE DATA Goal status:  MET 12/08/2023  8.  Patient negotiates ramps, curbs & stairs with single rail with prosthesis & cane modified independent safely.  Baseline: SEE OBJECTIVE DATA Goal status:  Ongoing 12/08/2023   PLAN:  PT FREQUENCY: 2x/week  PT DURATION: 12 weeks  PLANNED INTERVENTIONS: 97164- PT Re-evaluation, 97110-Therapeutic exercises, 97530- Therapeutic activity, 97112- Neuromuscular re-education, [phone]- Self Care, 88416- Gait training, [phone]- Prosthetic training, Patient/Family education, Balance training, Stair training, DME instructions, and physical performance testing  PLAN FOR NEXT SESSION:   [facility] Medicare reauthorization &  finish assessing LTGs,   re-certify for further PT  Vladimir Faster, PT, DPT 12/08/2023, 3:16 PM   Date of referral: 08/31/2023 Referring provider: Aldean Baker, MD Referring diagnosis? Z60.109 (ICD-10-CM) - Left below-knee amputee  Treatment diagnosis? (if different than referring diagnosis)  Other abnormalities of gait and mobility  ICD-10-CM: R26.89   Unsteadiness on feet  ICD-10-CM: R26.81   Muscle weakness (generalized)  ICD-10-CM: M62.81   Impaired functional mobility, balance, gait, and endurance  ICD-10-CM: Z74.09   What was this (referring dx) caused by? Surgery (Type: left BKA)  Nature of Condition: Initial Onset (within last 3 months) prosthesis delivery    Laterality:  Lt  Current Functional Measure Score: 11/09/2023 Berg Balance 35/56;  at evalation was standing balance supervision static stance 1 min.   Objective measurements identify impairments when they are compared to normal values, the uninvolved extremity, and prior level of function.  [x]  Yes  []  No  Objective assessment of functional ability: Moderate functional limitations   Briefly describe symptoms: Eval was patient is dependent in prosthetic care & use.  He has  impaired mobility. Patient is deconditioned from prolonged limited activity with wound, then amputation. 11/11/2023: patient is wearing prosthesis most of awake hours and wound is almost completely healed. Pt has general understanding of prosthetic care.  He is ambulating in home with cane and community with RW.  Previously was w/c bound.   How did symptoms start: infected wound led to amputation  Average pain intensity:  Last 24 hours: 0  Past week: 0  How often does the pt experience symptoms? Constantly  How much have the symptoms interfered with usual daily activities? Extremely  How has condition changed since care began at this facility? Improved - Better  In general, how is the patients overall health? Good   BACK PAIN (STarT Back Screening Tool) No

## 2023-12-10 ENCOUNTER — Ambulatory Visit: Payer: Medicare Other | Admitting: Physical Therapy

## 2023-12-10 ENCOUNTER — Encounter: Payer: Self-pay | Admitting: Physical Therapy

## 2023-12-10 DIAGNOSIS — M6281 Muscle weakness (generalized): Secondary | ICD-10-CM

## 2023-12-10 DIAGNOSIS — R2689 Other abnormalities of gait and mobility: Secondary | ICD-10-CM

## 2023-12-10 DIAGNOSIS — R42 Dizziness and giddiness: Secondary | ICD-10-CM

## 2023-12-10 DIAGNOSIS — R2681 Unsteadiness on feet: Secondary | ICD-10-CM | POA: Diagnosis not present

## 2023-12-10 DIAGNOSIS — Z7409 Other reduced mobility: Secondary | ICD-10-CM

## 2023-12-10 NOTE — Therapy (Signed)
 OUTPATIENT PHYSICAL THERAPY PROSTHETIC TREATMENT, PROGRESS NOTE & RECERTIFICATION   Patient Name: Justin [name] MRN: [id number] DOB:15-Oct-1932, 89 y.o., male Today's Date: 12/10/2023  Progress Note Reporting Period 11/09/2023 to 12/10/2023  See note below for Objective Data and Assessment of Progress/Goals.   END OF SESSION:  PT End of Session - 12/10/23 1011     Visit Number 16    Number of Visits 32    Date for PT Re-Evaluation 03/09/24    Authorization Type [employer] Medicare    Authorization Time Period $20 co-pay, $20 COPAY 16 PT VISITS APPROVED 12/18-2/12/25    Authorization - Visit Number 16    Authorization - Number of Visits 16    Progress Note Due on Visit 20    PT Start Time 1011    PT Stop Time 1055    PT Time Calculation (min) 44 min    Equipment Utilized During Treatment Gait belt    Activity Tolerance Patient tolerated treatment well    Behavior During Therapy WFL for tasks assessed/performed                     $20 COPAY 16 PT VISITS APPROVED 12/18-2/12/25   Past Medical History:  Diagnosis Date   Anxiety    Arthritis    Carotid arterial disease (HCC)    CKD (chronic kidney disease)    Coronary artery disease    Diabetes mellitus without complication (HCC)    Diabetic retinopathy (HCC)    NPDR OU   Diverticulitis    Dyspnea    GERD (gastroesophageal reflux disease)    History of kidney stones 2021   Hypercholesteremia    Hypertension    Hypertensive retinopathy    OU   Myocardial infarction [facility]) 2009   Pneumonia    as a child   Prostate cancer (HCC)    UTI (lower urinary tract infection)    Past Surgical History:  Procedure Laterality Date   ABDOMINAL AORTOGRAM W/LOWER EXTREMITY N/A 06/22/2023   Procedure: ABDOMINAL AORTOGRAM W/LOWER EXTREMITY;  Surgeon: Maeola Harman, MD;  Location: [facility] INVASIVE CV LAB;  Service: Cardiovascular;  Laterality: N/A;   ABDOMINAL SURGERY     for diverticulitis; also removed appendix    AMPUTATION Left 06/24/2023   Procedure: LEFT BELOW THE KNEE AMPUTATION;  Surgeon: Nadara Mustard, MD;  Location: [facility] OR;  Service: Orthopedics;  Laterality: Left;   APPENDECTOMY     CARDIAC CATHETERIZATION  2018   CATARACT EXTRACTION     CATARACT EXTRACTION, BILATERAL     CHOLECYSTECTOMY N/A 10/12/2017   Procedure: LAPAROSCOPIC CHOLECYSTECTOMY;  Surgeon: Abigail Miyamoto, MD;  Location: [facility] OR;  Service: General;  Laterality: N/A;   CORONARY ARTERY BYPASS GRAFT  2009   ERCP N/A 12/21/2018   Procedure: ENDOSCOPIC RETROGRADE CHOLANGIOPANCREATOGRAPHY (ERCP);  Surgeon: Jeani Hawking, MD;  Location: [facility] ENDOSCOPY;  Service: Endoscopy;  Laterality: N/A;   ESOPHAGOGASTRODUODENOSCOPY (EGD) WITH PROPOFOL N/A 12/17/2018   Procedure: ESOPHAGOGASTRODUODENOSCOPY (EGD) WITH PROPOFOL;  Surgeon: Jeani Hawking, MD;  Location: [facility] ENDOSCOPY;  Service: Endoscopy;  Laterality: N/A;   EUS Left 12/17/2018   Procedure: UPPER ENDOSCOPIC ULTRASOUND (EUS) LINEAR;  Surgeon: Jeani Hawking, MD;  Location: [facility] ENDOSCOPY;  Service: Endoscopy;  Laterality: Left;   EYE SURGERY     "for bleeding in eye"   HERNIA REPAIR     LEFT HEART CATH AND CORONARY ANGIOGRAPHY N/A 11/04/2016   Procedure: Left Heart Cath and Coronary Angiography;  Surgeon: Yates Decamp, MD;  Location: [facility] INVASIVE  CV LAB;  Service: Cardiovascular;  Laterality: N/A;   LOWER EXTREMITY ANGIOGRAPHY N/A 11/04/2016   Procedure: Lower Extremity Angiography;  Surgeon: Yates Decamp, MD;  Location: [facility] INVASIVE CV LAB;  Service: Cardiovascular;  Laterality: N/A;   LOWER EXTREMITY ANGIOGRAPHY N/A 01/03/2020   Procedure: LOWER EXTREMITY ANGIOGRAPHY;  Surgeon: Yates Decamp, MD;  Location: [facility] INVASIVE CV LAB;  Service: Cardiovascular;  Laterality: N/A;   LOWER EXTREMITY ANGIOGRAPHY Bilateral 01/14/2022   Procedure: Lower Extremity Angiography;  Surgeon: Yates Decamp, MD;  Location: [facility] INVASIVE CV LAB;  Service: Cardiovascular;  Laterality: Bilateral;   LUMBAR LAMINECTOMY/DECOMPRESSION  MICRODISCECTOMY Bilateral 11/09/2020   Procedure: Laminectomy and Foraminotomy - bilateral - Lumbar Four-Five.;  Surgeon: Julio Sicks, MD;  Location: [facility] OR;  Service: Neurosurgery;  Laterality: Bilateral;  posterior   PROSTATE SURGERY     REMOVAL OF STONES  12/21/2018   Procedure: REMOVAL OF STONES;  Surgeon: Jeani Hawking, MD;  Location: [facility] ENDOSCOPY;  Service: Endoscopy;;   SPHINCTEROTOMY  12/21/2018   Procedure: Dennison Mascot;  Surgeon: Jeani Hawking, MD;  Location: [facility] ENDOSCOPY;  Service: Endoscopy;;   Patient Active Problem List   Diagnosis Date Noted   Osteomyelitis of great toe of left foot (HCC) 06/20/2023   Gangrene of toe of left foot (HCC) 06/19/2023   Normocytic anemia 06/19/2023   Eschar of left great toe 05/22/2023   Near syncope 06/15/2021   Dizziness 06/15/2021   Lumbar stenosis with neurogenic claudication 11/09/2020   CKD stage 3 due to type 2 diabetes mellitus (HCC) 12/14/2019   Asymptomatic bilateral carotid artery stenosis 12/14/2019   Abdominal distention    CAD s/p CABG in 2009 12/14/2018   Acute renal failure superimposed on stage 3 chronic kidney disease (HCC) 12/14/2018   UTI (urinary tract infection) 12/14/2018   HLD (hyperlipidemia) 12/14/2018   Sepsis (HCC) 12/14/2018   Abnormal LFTs    S/P CABG (coronary artery bypass graft) 12/06/2018   Asymptomatic stenosis of right carotid artery 12/06/2018   Claudication in peripheral vascular disease (HCC) 12/06/2018   Orthostatic hypotension due to diabetic dysautonomia 12/06/2018   Elevated liver enzymes 12/06/2018   Abdominal pain 10/12/2018   Abnormal liver function tests 10/12/2018   Foreign body in stomach 10/12/2018   Primary hypertension    GERD (gastroesophageal reflux disease)    Diabetes mellitus without complication (HCC)    Atherosclerosis of native coronary artery of native heart without angina pectoris     PCP: Eloisa Northern, MD   REFERRING PROVIDER: Nadara Mustard, MD  ONSET DATE: 09/14/2023  Prosthesis delivery  REFERRING DIAG: U98.119 (ICD-10-CM) - Left below-knee amputee   THERAPY DIAG:  Other abnormalities of gait and mobility  Unsteadiness on feet  Dizziness and giddiness  Muscle weakness (generalized)  Impaired functional mobility, balance, gait, and endurance  Rationale for Evaluation and Treatment: Rehabilitation  SUBJECTIVE:   SUBJECTIVE STATEMENT: He is wearing prosthesis all awake hours with no skin issues or soreness.  He is using cane in community & house, occasionally within room like kitchen few steps with nothing.  No falls.   PERTINENT HISTORY: Left TTA 06/24/23, DM2, retinopathy, GERD, HLD, HTN, CAD, CABG 2009, PAD, prostate CA, CKD stage 3b, arthritis, Laminectomy & Foraminotomy bilateral L4-5,   PAIN:  Are you having pain? No, 0/10 pain.  PRECAUTIONS: Fall  WEIGHT BEARING RESTRICTIONS: No  FALLS: Has patient fallen in last 6 months? Yes. Number of falls 1 no injuries  LIVING ENVIRONMENT: Lives with: lives with their family Lives in: House Home Access: Ramped entrance  and single step Home layout: Two level, 1/2 bath on main level, and bedroom Stairs: Yes: Internal: 14 steps; on left going up and can reach both and External: 1+1 steps; none Has following equipment at home: Quad cane small base, Environmental consultant - 2 wheeled, Wheelchair (manual), shower chair, Shower bench, and bed side commode  OCCUPATION: retired from [employer]  PLOF: Independent used cane & walker   PATIENT GOALS: to use prosthesis to walk in home & community, family wants to be safe,  go upstairs to shower  OBJECTIVE:   POSTURE: Evaluation on 09/16/2023:  rounded shoulders, forward head, flexed trunk , and weight shift right  LOWER EXTREMITY ROM:  ROM P:passive  A:active Right 09/24/23 Left 09/24/23  Hip flexion    Hip extension Maisie Fus -5* Thomas -11*  Hip abduction    Hip adduction    Hip internal rotation    Hip external rotation    Knee flexion    Knee  extension  Supine P: -7*  Ankle dorsiflexion    Ankle plantarflexion    Ankle inversion    Ankle eversion     (Blank rows = not tested)  LOWER EXTREMITY MMT:  MMT Left 09/24/23  Hip flexion   Hip extension Sidelying 3-/5  Hip abduction Sidleying 3/5  Hip adduction   Hip internal rotation   Hip external rotation   Knee flexion   Knee extension 3-/5  Ankle dorsiflexion   Ankle plantarflexion   Ankle inversion   Ankle eversion   At Evaluation all strength testing is grossly seated and functionally standing / gait. (Blank rows = not tested)  TRANSFERS: Evaluation on 09/16/2023:  Sit to stand: SBA 20" w/c with armrest to RW limited use of LLE Stand to sit: SBA RW to 20" w/c with armrest limited use of LLE  FUNCTIONAL TESTs:  12/08/2023: Sharlene Motts Balance 39/56 TUG: no device standard 29.28 sec;  with cane std 25.68 sec and cognitive 39.75 sec naming states (stopped naming during turns & repeated 2 states) 4-square step test with cane:  30.68   cane & TTA prosthesis, modA for backward step & minA all other directions  32.62   cane & TTA prosthesis, modA for backward step & minA all other directions    11/09/2023 Berg: 35/56   TUG trial 1: 38sec 2 stumbles with pt able to correct without therapist intervention, trial 2: 34sec no stumbles, MinA to CGA  09/24/2023:  Patient able to stand up from 20" w/c with armrest with RW for stabilization with supervision.  Patient able to maintain upright with RW support without assistance.  Patient able to balance without upper extremity support with minA for 60 seconds.  Deferred at evaluation on 09/16/2023 as limb too edematous to don prosthesis  GAIT: 12/10/2023: 6 Minute Walk Test: with cane 740' with supervision.    Resting HR 73 SpO2 98%  after gait HR 119  SpO2 99%  Modified Borg scale for dyspnea: 3: moderate shortness of breath or breathing difficulty -pt neg ramp & curb with cane with minA for balance.  He neg ramp & curb with RW  safely.  -pt neg 11 steps with single rail & cane with supervision with step-to pattern.   12/08/2023: Functional Gait Assessment with cane: 5/30 indicating high fall risk.  Gait velocity:  with TTA prosthesis & cane stand alone tip comfortable /self-selected 1.66 ft/sec with SBA and fast pace 2.26 ft/sec with minA.   11/17/2023: Ambulated with stand alone cane CGA with bouts of SBA,  step through pattern with inconsistent gait cadence. Was also able to walk short distances without AD and CGA with vc to slow gait down for less instances of tripping.  11/09/2023:  Self selected gait speed: 1.46 ft/sec with stand alone cane, MinA  09/24/2023:   Patient ambulated 50 feet with RW with CGA.  Initially patient walking with a step to pattern with minimal weight shift onto prosthesis.   Deferred at evaluation on 09/16/2023 as limb too edematous to don prosthesis Gait pattern:  Distance walked:  Assistive device utilized:  Level of assistance:  Comments:   PROSTHETIC WEAR ASSESSMENT: 12/08/2023: Pt tolerates wear >90% of awake hours with no skin issues (larger wound that he initially had on tibial crest is completely healed) and reports no residual limb pain.  Pt & dtr verbalize proper prosthetic care with skin check, residual limb care, care of non-amputated limb, prosthetic cleaning, ply sock cleaning, correct ply sock adjustment, proper wear schedule/adjustment, and proper weight-bearing schedule/adjustment.  He requires cues to problem solving new prosthetic issues.    11/11/2023: Patient is wearing prosthesis all awake hours and checking / drying or switching Vivewear under liner every 5 hours.  Wound has one superficial scab 2mm & 3mm at distal portion. Remainder is healed.     Evaluation on 09/16/2023:  Patient is dependent with: skin check, residual limb care, care of non-amputated limb, prosthetic cleaning, ply sock cleaning, correct ply sock adjustment, proper wear schedule/adjustment,  and proper weight-bearing schedule/adjustment Donning prosthesis: Max A Doffing prosthesis: Min A Prosthetic wear tolerance: 30-45 min 2 days ago when prosthesis delivered.  Prosthetic weight bearing tolerance: 3 minutes partial weight bearing trying to engage pin suspension.  Pt reports distal lateral limb pain from wear 2 days ago.  Edema: pitting edema  Residual limb condition: scab dry over tibial crest 5cm wide X 15 cm long.  Dry skin, normal color & temperature. Cylindrical shape Prosthetic description: silicon liner with anterior portion 9mm thick, secondary pelite foam liner, total contact socket,     TODAY'S TREATMENT:                                                                                                                             DATE:  12/10/2023: Prosthetic Training with left Transtibial Amputation: See objective data / gait. PT reviewed results with pt & dtr. Pt's granddaughter is getting married in their backyard 01/27/24.  PT recommended that his daughter count her steps to walk from house to where service is going to be.  PT had her walk 20' which took 10 steps.  So if we double number of steps that should be close to feet required.  He would also like to dance one dance with his granddaughter.  PT discussed shoes and they plan to still use tennis shoes not men's dress shoes.  PT will build this into his functional activities.   PT reviewed short, medium and long walk for improving activity tolerance.  Pt & dtr verbalized understanding.    TREATMENT:                                                                                                                             DATE:  12/08/2023: Prosthetic Training with left Transtibial Amputation: See prosthetic care section & gait section of objective data.   Physical Performance Testing: See objective data    TREATMENT:                                                                                                                              DATE:  12/02/2023: Prosthetic Training with left Transtibial Amputation: Pt amb 100' in clinic area without device with supervision scanning right/left.   PT demo & verbal cues on stepping over obstacle.  Pt able to step over 4" half foam roller with cane with CGA & cues.   PT demo & verbal cues on picking up item.  Pt able to lift 10# kettle bell from floor and carry it with cane 8' 3 reps with PT CGA.  Figure 8 gait with cane working on turning around obstacle.   Neuromuscular Re-education:  4-square stepping over taped lines on floor without device with minA for balance 2 reps CW & CCW. Progressed to 4-square stepping over 1" pipes with maxA 2x for loss of balance when stepping backwards.  Along counter for safety: backwards and side stepping with intermittent touch 3 laps;  braiding with BUE support 3 laps.  Ambulating outward powering over resistance and return controlling stepping against resistance 5# anterior, posterior, to right & to left 3 reps ea. PT minA/CGA for safety & tactile cues.  Pt improved with instruction & reps.     PATIENT EDUCATION: PATIENT EDUCATED ON FOLLOWING PROSTHETIC CARE: Education details:   Skin check, Residual limb care, Prosthetic cleaning, Correct ply sock adjustment, Propper donning, and Proper wear schedule/adjustment Prosthetic wear tolerance: liner only initially 2 hours 3x/day, 7 days/week Person educated: Patient and Child(ren) Education method: Explanation, Demonstration, Tactile cues, and Verbal cues Education comprehension: dtr & granddaughter verbalized understanding, verbal cues required, tactile cues required, and needs further education  HOME EXERCISE PROGRAM:  ASSESSMENT:  CLINICAL IMPRESSION: Patient has made significant progress with PT. Functional Outcome Test are placing him at high fall risk. He has potential to function in community and home safer with less support from device with skilled PT.  Patient will  continue to benefit from continued skilled physical therapy to address deficits.    OBJECTIVE IMPAIRMENTS: Abnormal gait, decreased activity tolerance, decreased balance, decreased endurance, decreased knowledge of condition, decreased knowledge of use of DME, decreased mobility, difficulty walking, decreased ROM, decreased strength, increased edema, prosthetic dependency , and pain.   ACTIVITY LIMITATIONS: carrying, lifting, sitting, standing, stairs, transfers, and locomotion level  PARTICIPATION LIMITATIONS: standing ADLS, cleaning, community activity, and household activity  PERSONAL FACTORS: Age, Fitness, Past/current experiences, Time since onset of injury/illness/exacerbation, and 3+ comorbidities: see PMH  are also affecting patient's functional outcome.   REHAB POTENTIAL: Good  CLINICAL DECISION MAKING: Evolving/moderate complexity  EVALUATION COMPLEXITY: Moderate   GOALS: Goals reviewed with patient? Yes  SHORT TERM GOALS: Target date: 01/11/2024  Patient donnes prosthesis modified independent & family verbalizes proper cleaning. Baseline: SEE OBJECTIVE DATA Goal status: MET 10/07/2023 2.  Patient tolerates prosthesis >8 hrs total /day without skin issues increasing or limb pain >4/10 after standing. Baseline: SEE OBJECTIVE DATA Goal status: MET 10/07/2023  3.  Patient able to reach 7" and look over both shoulders with RW support with supervision. Baseline: SEE OBJECTIVE DATA Goal status: MET 10/14/2023  4. Patient ambulates 97' with RW & prosthesis with supervision. Baseline: SEE OBJECTIVE DATA Goal status: MET 10/07/2023  5. Patient negotiates ramps & curbs with RW & prosthesis with minA. Baseline: SEE OBJECTIVE DATA Goal status: MET 10/07/2023  LONG TERM GOALS: Target date: 12/10/2023  Patient & family demonstrate & verbalize understanding of prosthetic care to enable safe utilization of prosthesis. Baseline: SEE OBJECTIVE DATA Goal status: MET 12/08/2023  Patient  tolerates prosthesis wear >90% of awake hours without skin or limb pain issues. Baseline: SEE OBJECTIVE DATA Goal status: MET 12/08/2023  Initial balance goal at evaluation was Task of [employer] with RW support >/= 30/56 to indicate lower fall risk with standing ADLs.  MET 11/09/2023  Pt exceeded this LTG so PT set higher level goal  Berg Balance >/= 45/56 to indicate lower fall risk with standing ADLs.  Baseline: SEE OBJECTIVE DATA Goal status:  NOT MET  12/08/2023  5.  Patient ambulates 85' modified independent &  >200' family supervision with prosthesis & RW.  MET 12/08/2023  6.  Patient ambulates 200' modified independent with prosthesis & cane. Baseline: SEE OBJECTIVE DATA Goal status:  Ongoing   12/10/2023  7.  Patient negotiates ramps, curbs & stairs with single rail with prosthesis & RW with family assistance safely.  Baseline: SEE OBJECTIVE DATA Goal status:  MET 12/08/2023  8.  Patient negotiates ramps, curbs & stairs with single rail with prosthesis & cane modified independent safely.  Baseline: SEE OBJECTIVE DATA Goal status:  Ongoing 12/10/2023  UPDATED LONG TERM GOALS: Target date: 03/09/2024  Patient & family demonstrates & verbalizes ongoing understanding of prosthetic care including problem solving issues & tolerates wear >90% of awake hours without skin or limb pain issues to enable safe utilization of prosthesis. Baseline: SEE OBJECTIVE DATA Goal status: Ongoing 12/10/2023  Timed Up & Go without Assistive device <30 sec safely. Baseline: SEE OBJECTIVE DATA Goal status: SET 12/10/2023  Berg Balance >/= 45/56 to indicate lower fall risk with standing ADLs.  Baseline: SEE OBJECTIVE DATA Goal status:  Ongoing  12/10/2023  8.  Patient ambulates 74' with prosthesis only carrying plate & cup modified independent. Baseline: SEE OBJECTIVE DATA Goal status: SET 12/10/2023  6. 6 Minute Walk Test >900' modified independent with prosthesis & cane. Baseline: SEE OBJECTIVE  DATA Goal status:  SET  12/10/2023  7.  Patient negotiates ramps, curbs & stairs with single rail with prosthesis & cane modified independent.  Baseline: SEE OBJECTIVE DATA Goal status:  Updated 12/10/2023  8.  Patient demonstrates improved balance with 4-Square Step Test with cane <25 sec safely  Baseline: SEE OBJECTIVE DATA Goal status:  SET 12/10/2023 PLAN:  PT FREQUENCY: 1-2x/wk  PT DURATION: 90 days  PLANNED INTERVENTIONS: 97164- PT Re-evaluation, 97110-Therapeutic exercises, 97530- Therapeutic activity, 97112- Neuromuscular re-education, [phone]- Self Care, 21308- Gait training, [phone]- Prosthetic training, Patient/Family education, Balance training, Stair training, DME instructions, and physical performance testing  PLAN FOR NEXT SESSION:   work towards STGs,  check on distance to walk to granddaughter's wedding, balance activities.   Vladimir Faster, PT, DPT 12/10/2023, 11:00 AM   Date of referral: 08/31/2023 Referring provider: Aldean Baker, MD Referring diagnosis? O96.295 (ICD-10-CM) - Left below-knee amputee  Treatment diagnosis? (if different than referring diagnosis)  Other abnormalities of gait and mobility  ICD-10-CM: R26.89   Unsteadiness on feet  ICD-10-CM: R26.81   Muscle weakness (generalized)  ICD-10-CM: M62.81   Impaired functional mobility, balance, gait, and endurance  ICD-10-CM: Z74.09   What was this (referring dx) caused by? Surgery (Type: left BKA)  Nature of Condition: Initial Onset (within last 3 months) prosthesis delivery    Laterality: Lt  Current Functional Measure Score: 12/10/2023 Berg Balance 39/56;  11/09/2023 Berg Balance 35/56;  at evalation was standing balance supervision static stance 1 min.   Objective measurements identify impairments when they are compared to normal values, the uninvolved extremity, and prior level of function.  [x]  Yes  []  No  Objective assessment of functional ability: Moderate functional limitations   Briefly  describe symptoms: Eval was patient is dependent in prosthetic care & use.  He has impaired mobility. Patient is deconditioned from prolonged limited activity with wound, then amputation. 11/11/2023: patient is wearing prosthesis most of awake hours and wound is almost completely healed. Pt has general understanding of prosthetic care.  He is ambulating in home with cane and community with RW.  Previously was w/c bound.  12/10/2023: patient is functioning with family with cane in community.  Functional Outcome Tests indicate high fall risk and he has potential to decrease risk with PT.   How did symptoms start: infected wound led to amputation  Average pain intensity:  Last 24 hours: 0  Past week: 0  How often does the pt experience symptoms? Constantly  How much have the symptoms interfered with usual daily activities? Extremely  How has condition changed since care began at this facility? Improved - Better  In general, how is the patients overall health? Good   BACK PAIN (STarT Back Screening Tool) No

## 2023-12-16 ENCOUNTER — Encounter: Payer: Self-pay | Admitting: Physical Therapy

## 2023-12-16 ENCOUNTER — Ambulatory Visit: Admitting: Physical Therapy

## 2023-12-16 DIAGNOSIS — R2689 Other abnormalities of gait and mobility: Secondary | ICD-10-CM | POA: Diagnosis not present

## 2023-12-16 DIAGNOSIS — M6281 Muscle weakness (generalized): Secondary | ICD-10-CM

## 2023-12-16 DIAGNOSIS — R2681 Unsteadiness on feet: Secondary | ICD-10-CM | POA: Diagnosis not present

## 2023-12-16 DIAGNOSIS — Z7409 Other reduced mobility: Secondary | ICD-10-CM

## 2023-12-16 DIAGNOSIS — R42 Dizziness and giddiness: Secondary | ICD-10-CM | POA: Diagnosis not present

## 2023-12-16 NOTE — Therapy (Signed)
 OUTPATIENT PHYSICAL THERAPY PROSTHETIC TREATMENT   Patient Name: Justin [name] MRN: [id number] DOB:[birth year], 89 y.o., male Today's Date: 12/16/2023  END OF SESSION:  PT End of Session - 12/16/23 1429     Visit Number 17    Number of Visits 35    Date for PT Re-Evaluation 03/09/24    Authorization Type [employer] Medicare    Authorization Time Period --    Authorization - Visit Number --    Authorization - Number of Visits --    Progress Note Due on Visit 26    PT Start Time 1430    PT Stop Time 1510    PT Time Calculation (min) 40 min    Equipment Utilized During Treatment Gait belt    Activity Tolerance Patient tolerated treatment well    Behavior During Therapy WFL for tasks assessed/performed                      $20 COPAY 16 PT VISITS APPROVED 12/18-2/12/25   Past Medical History:  Diagnosis Date   Anxiety    Arthritis    Carotid arterial disease (HCC)    CKD (chronic kidney disease)    Coronary artery disease    Diabetes mellitus without complication (HCC)    Diabetic retinopathy (HCC)    NPDR OU   Diverticulitis    Dyspnea    GERD (gastroesophageal reflux disease)    History of kidney stones 2021   Hypercholesteremia    Hypertension    Hypertensive retinopathy    OU   Myocardial infarction [facility]) 2009   Pneumonia    as a child   Prostate cancer (HCC)    UTI (lower urinary tract infection)    Past Surgical History:  Procedure Laterality Date   ABDOMINAL AORTOGRAM W/LOWER EXTREMITY N/A 06/22/2023   Procedure: ABDOMINAL AORTOGRAM W/LOWER EXTREMITY;  Surgeon: Maeola Harman, MD;  Location: [facility] INVASIVE CV LAB;  Service: Cardiovascular;  Laterality: N/A;   ABDOMINAL SURGERY     for diverticulitis; also removed appendix   AMPUTATION Left 06/24/2023   Procedure: LEFT BELOW THE KNEE AMPUTATION;  Surgeon: Nadara Mustard, MD;  Location: [facility] OR;  Service: Orthopedics;  Laterality: Left;   APPENDECTOMY     CARDIAC CATHETERIZATION  2018    CATARACT EXTRACTION     CATARACT EXTRACTION, BILATERAL     CHOLECYSTECTOMY N/A 10/12/2017   Procedure: LAPAROSCOPIC CHOLECYSTECTOMY;  Surgeon: Abigail Miyamoto, MD;  Location: [facility] OR;  Service: General;  Laterality: N/A;   CORONARY ARTERY BYPASS GRAFT  2009   ERCP N/A 12/21/2018   Procedure: ENDOSCOPIC RETROGRADE CHOLANGIOPANCREATOGRAPHY (ERCP);  Surgeon: Jeani Hawking, MD;  Location: [facility] ENDOSCOPY;  Service: Endoscopy;  Laterality: N/A;   ESOPHAGOGASTRODUODENOSCOPY (EGD) WITH PROPOFOL N/A 12/17/2018   Procedure: ESOPHAGOGASTRODUODENOSCOPY (EGD) WITH PROPOFOL;  Surgeon: Jeani Hawking, MD;  Location: [facility] ENDOSCOPY;  Service: Endoscopy;  Laterality: N/A;   EUS Left 12/17/2018   Procedure: UPPER ENDOSCOPIC ULTRASOUND (EUS) LINEAR;  Surgeon: Jeani Hawking, MD;  Location: [facility] ENDOSCOPY;  Service: Endoscopy;  Laterality: Left;   EYE SURGERY     "for bleeding in eye"   HERNIA REPAIR     LEFT HEART CATH AND CORONARY ANGIOGRAPHY N/A 11/04/2016   Procedure: Left Heart Cath and Coronary Angiography;  Surgeon: Yates Decamp, MD;  Location: [facility] INVASIVE CV LAB;  Service: Cardiovascular;  Laterality: N/A;   LOWER EXTREMITY ANGIOGRAPHY N/A 11/04/2016   Procedure: Lower Extremity Angiography;  Surgeon: Yates Decamp, MD;  Location: [facility] INVASIVE CV  LAB;  Service: Cardiovascular;  Laterality: N/A;   LOWER EXTREMITY ANGIOGRAPHY N/A 01/03/2020   Procedure: LOWER EXTREMITY ANGIOGRAPHY;  Surgeon: Yates Decamp, MD;  Location: [facility] INVASIVE CV LAB;  Service: Cardiovascular;  Laterality: N/A;   LOWER EXTREMITY ANGIOGRAPHY Bilateral 01/14/2022   Procedure: Lower Extremity Angiography;  Surgeon: Yates Decamp, MD;  Location: [facility] INVASIVE CV LAB;  Service: Cardiovascular;  Laterality: Bilateral;   LUMBAR LAMINECTOMY/DECOMPRESSION MICRODISCECTOMY Bilateral 11/09/2020   Procedure: Laminectomy and Foraminotomy - bilateral - Lumbar Four-Five.;  Surgeon: Julio Sicks, MD;  Location: [facility] OR;  Service: Neurosurgery;  Laterality: Bilateral;  posterior   PROSTATE  SURGERY     REMOVAL OF STONES  12/21/2018   Procedure: REMOVAL OF STONES;  Surgeon: Jeani Hawking, MD;  Location: [facility] ENDOSCOPY;  Service: Endoscopy;;   SPHINCTEROTOMY  12/21/2018   Procedure: Dennison Mascot;  Surgeon: Jeani Hawking, MD;  Location: [facility] ENDOSCOPY;  Service: Endoscopy;;   Patient Active Problem List   Diagnosis Date Noted   Osteomyelitis of great toe of left foot (HCC) 06/20/2023   Gangrene of toe of left foot (HCC) 06/19/2023   Normocytic anemia 06/19/2023   Eschar of left great toe 05/22/2023   Near syncope 06/15/2021   Dizziness 06/15/2021   Lumbar stenosis with neurogenic claudication 11/09/2020   CKD stage 3 due to type 2 diabetes mellitus (HCC) 12/14/2019   Asymptomatic bilateral carotid artery stenosis 12/14/2019   Abdominal distention    CAD s/p CABG in 2009 12/14/2018   Acute renal failure superimposed on stage 3 chronic kidney disease (HCC) 12/14/2018   UTI (urinary tract infection) 12/14/2018   HLD (hyperlipidemia) 12/14/2018   Sepsis (HCC) 12/14/2018   Abnormal LFTs    S/P CABG (coronary artery bypass graft) 12/06/2018   Asymptomatic stenosis of right carotid artery 12/06/2018   Claudication in peripheral vascular disease (HCC) 12/06/2018   Orthostatic hypotension due to diabetic dysautonomia 12/06/2018   Elevated liver enzymes 12/06/2018   Abdominal pain 10/12/2018   Abnormal liver function tests 10/12/2018   Foreign body in stomach 10/12/2018   Primary hypertension    GERD (gastroesophageal reflux disease)    Diabetes mellitus without complication (HCC)    Atherosclerosis of native coronary artery of native heart without angina pectoris     PCP: Eloisa Northern, MD   REFERRING PROVIDER: Nadara Mustard, MD  ONSET DATE: 09/14/2023 Prosthesis delivery  REFERRING DIAG: Z61.096 (ICD-10-CM) - Left below-knee amputee   THERAPY DIAG:  Other abnormalities of gait and mobility  Unsteadiness on feet  Dizziness and giddiness  Muscle weakness  (generalized)  Impaired functional mobility, balance, gait, and endurance  Rationale for Evaluation and Treatment: Rehabilitation  SUBJECTIVE:   SUBJECTIVE STATEMENT: No new issues.  He went upstairs and was able to take a real shower.   It is ~650' from house to where granddaughter is getting married.   PERTINENT HISTORY: Left TTA 06/24/23, DM2, retinopathy, GERD, HLD, HTN, CAD, CABG 2009, PAD, prostate CA, CKD stage 3b, arthritis, Laminectomy & Foraminotomy bilateral L4-5,   PAIN:  Are you having pain? No, 0/10 pain.  PRECAUTIONS: Fall  WEIGHT BEARING RESTRICTIONS: No  FALLS: Has patient fallen in last 6 months? Yes. Number of falls 1 no injuries  LIVING ENVIRONMENT: Lives with: lives with their family Lives in: House Home Access: Ramped entrance and single step Home layout: Two level, 1/2 bath on main level, and bedroom Stairs: Yes: Internal: 14 steps; on left going up and can reach both and External: 1+1 steps; none Has following equipment at home:  Quad cane small base, Walker - 2 wheeled, Wheelchair (manual), shower chair, Shower bench, and bed side commode  OCCUPATION: retired from [employer]  PLOF: Independent used cane & walker   PATIENT GOALS: to use prosthesis to walk in home & community, family wants to be safe,  go upstairs to shower  OBJECTIVE:   POSTURE: Evaluation on 09/16/2023:  rounded shoulders, forward head, flexed trunk , and weight shift right  LOWER EXTREMITY ROM:  ROM P:passive  A:active Right 09/24/23 Left 09/24/23  Hip flexion    Hip extension Maisie Fus -5* Thomas -11*  Hip abduction    Hip adduction    Hip internal rotation    Hip external rotation    Knee flexion    Knee extension  Supine P: -7*  Ankle dorsiflexion    Ankle plantarflexion    Ankle inversion    Ankle eversion     (Blank rows = not tested)  LOWER EXTREMITY MMT:  MMT Left 09/24/23  Hip flexion   Hip extension Sidelying 3-/5  Hip abduction Sidleying 3/5  Hip  adduction   Hip internal rotation   Hip external rotation   Knee flexion   Knee extension 3-/5  Ankle dorsiflexion   Ankle plantarflexion   Ankle inversion   Ankle eversion   At Evaluation all strength testing is grossly seated and functionally standing / gait. (Blank rows = not tested)  TRANSFERS: Evaluation on 09/16/2023:  Sit to stand: SBA 20" w/c with armrest to RW limited use of LLE Stand to sit: SBA RW to 20" w/c with armrest limited use of LLE  FUNCTIONAL TESTs:  12/08/2023: Sharlene Motts Balance 39/56 TUG: no device standard 29.28 sec;  with cane std 25.68 sec and cognitive 39.75 sec naming states (stopped naming during turns & repeated 2 states) 4-square step test with cane:  30.68   cane & TTA prosthesis, modA for backward step & minA all other directions  32.62   cane & TTA prosthesis, modA for backward step & minA all other directions    11/09/2023 Berg: 35/56   TUG trial 1: 38sec 2 stumbles with pt able to correct without therapist intervention, trial 2: 34sec no stumbles, MinA to CGA  09/24/2023:  Patient able to stand up from 20" w/c with armrest with RW for stabilization with supervision.  Patient able to maintain upright with RW support without assistance.  Patient able to balance without upper extremity support with minA for 60 seconds.  Deferred at evaluation on 09/16/2023 as limb too edematous to don prosthesis  GAIT: 12/10/2023: 6 Minute Walk Test: with cane 740' with supervision.    Resting HR 73 SpO2 98%  after gait HR 119  SpO2 99%  Modified Borg scale for dyspnea: 3: moderate shortness of breath or breathing difficulty -pt neg ramp & curb with cane with minA for balance.  He neg ramp & curb with RW safely.  -pt neg 11 steps with single rail & cane with supervision with step-to pattern.   12/08/2023: Functional Gait Assessment with cane: 5/30 indicating high fall risk.  Gait velocity:  with TTA prosthesis & cane stand alone tip comfortable /self-selected 1.66  ft/sec with SBA and fast pace 2.26 ft/sec with minA.   11/17/2023: Ambulated with stand alone cane CGA with bouts of SBA, step through pattern with inconsistent gait cadence. Was also able to walk short distances without AD and CGA with vc to slow gait down for less instances of tripping.  11/09/2023:  Self selected gait speed: 1.46  ft/sec with stand alone cane, MinA  09/24/2023:   Patient ambulated 50 feet with RW with CGA.  Initially patient walking with a step to pattern with minimal weight shift onto prosthesis.   Deferred at evaluation on 09/16/2023 as limb too edematous to don prosthesis Gait pattern:  Distance walked:  Assistive device utilized:  Level of assistance:  Comments:   PROSTHETIC WEAR ASSESSMENT: 12/08/2023: Pt tolerates wear >90% of awake hours with no skin issues (larger wound that he initially had on tibial crest is completely healed) and reports no residual limb pain.  Pt & dtr verbalize proper prosthetic care with skin check, residual limb care, care of non-amputated limb, prosthetic cleaning, ply sock cleaning, correct ply sock adjustment, proper wear schedule/adjustment, and proper weight-bearing schedule/adjustment.  He requires cues to problem solving new prosthetic issues.    11/11/2023: Patient is wearing prosthesis all awake hours and checking / drying or switching Vivewear under liner every 5 hours.  Wound has one superficial scab 2mm & 3mm at distal portion. Remainder is healed.     Evaluation on 09/16/2023:  Patient is dependent with: skin check, residual limb care, care of non-amputated limb, prosthetic cleaning, ply sock cleaning, correct ply sock adjustment, proper wear schedule/adjustment, and proper weight-bearing schedule/adjustment Donning prosthesis: Max A Doffing prosthesis: Min A Prosthetic wear tolerance: 30-45 min 2 days ago when prosthesis delivered.  Prosthetic weight bearing tolerance: 3 minutes partial weight bearing trying to engage pin  suspension.  Pt reports distal lateral limb pain from wear 2 days ago.  Edema: pitting edema  Residual limb condition: scab dry over tibial crest 5cm wide X 15 cm long.  Dry skin, normal color & temperature. Cylindrical shape Prosthetic description: silicon liner with anterior portion 9mm thick, secondary pelite foam liner, total contact socket,     TODAY'S TREATMENT:                                                                                                                             DATE:  12/16/2023: Prosthetic Training with left Transtibial Amputation: PT educated pt and dtr in need for ability to ambulate when not fully focused on gait.  Pt amb with cane 450' naming foods A-Z.  He needed cues to perform cognitive task while ambulating.  No loss of balance.  Pt & dtr verbalized understanding of activity as HEP.   Resistive gait with blue theraband walking with cane outward against resistance and controlled return;  resistance anterior, posterior, right & left with CGA.  Neuromuscular Re-education: 4-square stepping over T-band on floor with counter posteriorly, chair backs laterally and PT supervision anteriorly.  CW & CCW 3 reps ea.  PT demo & verbal cues on set up above for safety and recommendation for performing 5 reps 3-5 days / wk.     TREATMENT:  DATE:  12/10/2023: Prosthetic Training with left Transtibial Amputation: See objective data / gait. PT reviewed results with pt & dtr. Pt's granddaughter is getting married in their backyard 01/27/24.  PT recommended that his daughter count her steps to walk from house to where service is going to be.  PT had her walk 20' which took 10 steps.  So if we double number of steps that should be close to feet required.  He would also like to dance one dance with his granddaughter.  PT discussed shoes and they plan to still  use tennis shoes not men's dress shoes.  PT will build this into his functional activities.   PT reviewed short, medium and long walk for improving activity tolerance.  Pt & dtr verbalized understanding.    TREATMENT:                                                                                                                             DATE:  12/08/2023: Prosthetic Training with left Transtibial Amputation: See prosthetic care section & gait section of objective data.   Physical Performance Testing: See objective data      PATIENT EDUCATION: PATIENT EDUCATED ON FOLLOWING PROSTHETIC CARE: Education details:   Skin check, Residual limb care, Prosthetic cleaning, Correct ply sock adjustment, Propper donning, and Proper wear schedule/adjustment Prosthetic wear tolerance: liner only initially 2 hours 3x/day, 7 days/week Person educated: Patient and Child(ren) Education method: Explanation, Demonstration, Tactile cues, and Verbal cues Education comprehension: dtr & granddaughter verbalized understanding, verbal cues required, tactile cues required, and needs further education  HOME EXERCISE PROGRAM:  ASSESSMENT:  CLINICAL IMPRESSION: Patient and dtr appears to understand gait with cognitive task including rationale.  They also appears to understand 4-step including set up for safety.   Patient will continue to benefit from continued skilled physical therapy to address deficits.    OBJECTIVE IMPAIRMENTS: Abnormal gait, decreased activity tolerance, decreased balance, decreased endurance, decreased knowledge of condition, decreased knowledge of use of DME, decreased mobility, difficulty walking, decreased ROM, decreased strength, increased edema, prosthetic dependency , and pain.   ACTIVITY LIMITATIONS: carrying, lifting, sitting, standing, stairs, transfers, and locomotion level  PARTICIPATION LIMITATIONS: standing ADLS, cleaning, community activity, and household activity  PERSONAL  FACTORS: Age, Fitness, Past/current experiences, Time since onset of injury/illness/exacerbation, and 3+ comorbidities: see PMH  are also affecting patient's functional outcome.   REHAB POTENTIAL: Good  CLINICAL DECISION MAKING: Evolving/moderate complexity  EVALUATION COMPLEXITY: Moderate   GOALS: Goals reviewed with patient? Yes  SHORT TERM GOALS: Target date: 01/11/2024  Patient verbalizes understanding of updated HEP. Baseline: SEE OBJECTIVE DATA Goal status: Ongoing   12/16/2023 2.  Timed Up & Go with cane <22 sec Baseline: SEE OBJECTIVE DATA Goal status: Ongoing   12/16/2023  3.  Patient ambulates 150' with cane scanning environment maintaining path.  Baseline: SEE OBJECTIVE DATA Goal status: Ongoing   12/16/2023    UPDATED LONG TERM GOALS: Target date: 03/09/2024  Patient & family demonstrates & verbalizes ongoing understanding of prosthetic care including problem solving issues & tolerates wear >90% of awake hours without skin or limb pain issues to enable safe utilization of prosthesis. Baseline: SEE OBJECTIVE DATA Goal status: Ongoing   12/16/2023  Timed Up & Go without Assistive device <30 sec safely. Baseline: SEE OBJECTIVE DATA Goal status: Ongoing   12/16/2023  Berg Balance >/= 45/56 to indicate lower fall risk with standing ADLs.  Baseline: SEE OBJECTIVE DATA Goal status:  Ongoing   12/16/2023  8.  Patient ambulates 45' with prosthesis only carrying plate & cup modified independent. Baseline: SEE OBJECTIVE DATA Goal status: Ongoing   12/16/2023  6. 6 Minute Walk Test >900' modified independent with prosthesis & cane. Baseline: SEE OBJECTIVE DATA Goal status:  Ongoing   12/16/2023  7.  Patient negotiates ramps, curbs & stairs with single rail with prosthesis & cane modified independent.  Baseline: SEE OBJECTIVE DATA Goal status:  Ongoing   12/16/2023  8.  Patient demonstrates improved balance with 4-Square Step Test with cane <25 sec safely  Baseline: SEE  OBJECTIVE DATA Goal status:  Ongoing   12/16/2023 PLAN:  PT FREQUENCY: 1-2x/wk  PT DURATION: 90 days  PLANNED INTERVENTIONS: 97164- PT Re-evaluation, 97110-Therapeutic exercises, 97530- Therapeutic activity, 97112- Neuromuscular re-education, 97535- Self Care, 95621- Gait training, [phone]- Prosthetic training, Patient/Family education, Balance training, Stair training, DME instructions, and physical performance testing  PLAN FOR NEXT SESSION:  review resistive gait and add to HEP.  work towards [employer], balance activities.   Vladimir Faster, PT, DPT 12/16/2023, 5:00 PM   Date of referral: 08/31/2023 Referring provider: Aldean Baker, MD Referring diagnosis? H84.696 (ICD-10-CM) - Left below-knee amputee  Treatment diagnosis? (if different than referring diagnosis)  Other abnormalities of gait and mobility  ICD-10-CM: R26.89   Unsteadiness on feet  ICD-10-CM: R26.81   Muscle weakness (generalized)  ICD-10-CM: M62.81   Impaired functional mobility, balance, gait, and endurance  ICD-10-CM: Z74.09   What was this (referring dx) caused by? Surgery (Type: left BKA)  Nature of Condition: Initial Onset (within last 3 months) prosthesis delivery    Laterality: Lt  Current Functional Measure Score: 12/10/2023 Berg Balance 39/56;  11/09/2023 Berg Balance 35/56;  at evalation was standing balance supervision static stance 1 min.   Objective measurements identify impairments when they are compared to normal values, the uninvolved extremity, and prior level of function.  [x]  Yes  []  No  Objective assessment of functional ability: Moderate functional limitations   Briefly describe symptoms: Eval was patient is dependent in prosthetic care & use.  He has impaired mobility. Patient is deconditioned from prolonged limited activity with wound, then amputation. 11/11/2023: patient is wearing prosthesis most of awake hours and wound is almost completely healed. Pt has general understanding of prosthetic  care.  He is ambulating in home with cane and community with RW.  Previously was w/c bound.  12/10/2023: patient is functioning with family with cane in community.  Functional Outcome Tests indicate high fall risk and he has potential to decrease risk with PT.   How did symptoms start: infected wound led to amputation  Average pain intensity:  Last 24 hours: 0  Past week: 0  How often does the pt experience symptoms? Constantly  How much have the symptoms interfered with usual daily activities? Extremely  How has condition changed since care began at this facility? Improved - Better  In general, how is the patients overall health? Good   BACK PAIN (STarT  Back Screening Tool) No

## 2023-12-21 ENCOUNTER — Ambulatory Visit: Admitting: Physical Therapy

## 2023-12-21 ENCOUNTER — Encounter: Payer: Self-pay | Admitting: Physical Therapy

## 2023-12-21 DIAGNOSIS — R2681 Unsteadiness on feet: Secondary | ICD-10-CM | POA: Diagnosis not present

## 2023-12-21 DIAGNOSIS — R2689 Other abnormalities of gait and mobility: Secondary | ICD-10-CM | POA: Diagnosis not present

## 2023-12-21 DIAGNOSIS — R42 Dizziness and giddiness: Secondary | ICD-10-CM | POA: Diagnosis not present

## 2023-12-21 DIAGNOSIS — M6281 Muscle weakness (generalized): Secondary | ICD-10-CM | POA: Diagnosis not present

## 2023-12-21 DIAGNOSIS — Z7409 Other reduced mobility: Secondary | ICD-10-CM

## 2023-12-21 NOTE — Therapy (Addendum)
 OUTPATIENT PHYSICAL THERAPY PROSTHETIC TREATMENT   Patient Name: Justin [name] MRN: [id number] DOB:1933-05-03, 89 y.o., male Today's Date: 12/21/2023  END OF SESSION:  PT End of Session - 12/21/23 1013     Visit Number 18    Number of Visits 35    Date for PT Re-Evaluation 03/09/24    Authorization Type [employer] Medicare    Authorization Time Period $20 COPAY 4 PT VISITS 12/10/23 - 01/07/2024    Authorization - Visit Number 3    Authorization - Number of Visits 4    Progress Note Due on Visit 26    PT Start Time 1013    PT Stop Time 1056    PT Time Calculation (min) 43 min    Equipment Utilized During Treatment Gait belt    Activity Tolerance Patient tolerated treatment well    Behavior During Therapy WFL for tasks assessed/performed                       $20 COPAY 16 PT VISITS APPROVED 12/18-2/12/25   Past Medical History:  Diagnosis Date   Anxiety    Arthritis    Carotid arterial disease (HCC)    CKD (chronic kidney disease)    Coronary artery disease    Diabetes mellitus without complication (HCC)    Diabetic retinopathy (HCC)    NPDR OU   Diverticulitis    Dyspnea    GERD (gastroesophageal reflux disease)    History of kidney stones 2021   Hypercholesteremia    Hypertension    Hypertensive retinopathy    OU   Myocardial infarction [place]) 2009   Pneumonia    as a child   Prostate cancer (HCC)    UTI (lower urinary tract infection)    Past Surgical History:  Procedure Laterality Date   ABDOMINAL AORTOGRAM W/LOWER EXTREMITY N/A 06/22/2023   Procedure: ABDOMINAL AORTOGRAM W/LOWER EXTREMITY;  Surgeon: Maeola Harman, MD;  Location: [facility] INVASIVE CV LAB;  Service: Cardiovascular;  Laterality: N/A;   ABDOMINAL SURGERY     for diverticulitis; also removed appendix   AMPUTATION Left 06/24/2023   Procedure: LEFT BELOW THE KNEE AMPUTATION;  Surgeon: Nadara Mustard, MD;  Location: [facility] OR;  Service: Orthopedics;  Laterality: Left;   APPENDECTOMY      CARDIAC CATHETERIZATION  2018   CATARACT EXTRACTION     CATARACT EXTRACTION, BILATERAL     CHOLECYSTECTOMY N/A 10/12/2017   Procedure: LAPAROSCOPIC CHOLECYSTECTOMY;  Surgeon: Abigail Miyamoto, MD;  Location: [facility] OR;  Service: General;  Laterality: N/A;   CORONARY ARTERY BYPASS GRAFT  2009   ERCP N/A 12/21/2018   Procedure: ENDOSCOPIC RETROGRADE CHOLANGIOPANCREATOGRAPHY (ERCP);  Surgeon: Jeani Hawking, MD;  Location: [facility] ENDOSCOPY;  Service: Endoscopy;  Laterality: N/A;   ESOPHAGOGASTRODUODENOSCOPY (EGD) WITH PROPOFOL N/A 12/17/2018   Procedure: ESOPHAGOGASTRODUODENOSCOPY (EGD) WITH PROPOFOL;  Surgeon: Jeani Hawking, MD;  Location: [facility] ENDOSCOPY;  Service: Endoscopy;  Laterality: N/A;   EUS Left 12/17/2018   Procedure: UPPER ENDOSCOPIC ULTRASOUND (EUS) LINEAR;  Surgeon: Jeani Hawking, MD;  Location: [facility] ENDOSCOPY;  Service: Endoscopy;  Laterality: Left;   EYE SURGERY     "for bleeding in eye"   HERNIA REPAIR     LEFT HEART CATH AND CORONARY ANGIOGRAPHY N/A 11/04/2016   Procedure: Left Heart Cath and Coronary Angiography;  Surgeon: Yates Decamp, MD;  Location: [facility] INVASIVE CV LAB;  Service: Cardiovascular;  Laterality: N/A;   LOWER EXTREMITY ANGIOGRAPHY N/A 11/04/2016   Procedure: Lower Extremity Angiography;  Surgeon:  Yates Decamp, MD;  Location: [facility] INVASIVE CV LAB;  Service: Cardiovascular;  Laterality: N/A;   LOWER EXTREMITY ANGIOGRAPHY N/A 01/03/2020   Procedure: LOWER EXTREMITY ANGIOGRAPHY;  Surgeon: Yates Decamp, MD;  Location: [facility] INVASIVE CV LAB;  Service: Cardiovascular;  Laterality: N/A;   LOWER EXTREMITY ANGIOGRAPHY Bilateral 01/14/2022   Procedure: Lower Extremity Angiography;  Surgeon: Yates Decamp, MD;  Location: [facility] INVASIVE CV LAB;  Service: Cardiovascular;  Laterality: Bilateral;   LUMBAR LAMINECTOMY/DECOMPRESSION MICRODISCECTOMY Bilateral 11/09/2020   Procedure: Laminectomy and Foraminotomy - bilateral - Lumbar Four-Five.;  Surgeon: Julio Sicks, MD;  Location: [facility] OR;  Service: Neurosurgery;  Laterality:  Bilateral;  posterior   PROSTATE SURGERY     REMOVAL OF STONES  12/21/2018   Procedure: REMOVAL OF STONES;  Surgeon: Jeani Hawking, MD;  Location: [facility] ENDOSCOPY;  Service: Endoscopy;;   SPHINCTEROTOMY  12/21/2018   Procedure: Dennison Mascot;  Surgeon: Jeani Hawking, MD;  Location: [facility] ENDOSCOPY;  Service: Endoscopy;;   Patient Active Problem List   Diagnosis Date Noted   Osteomyelitis of great toe of left foot (HCC) 06/20/2023   Gangrene of toe of left foot (HCC) 06/19/2023   Normocytic anemia 06/19/2023   Eschar of left great toe 05/22/2023   Near syncope 06/15/2021   Dizziness 06/15/2021   Lumbar stenosis with neurogenic claudication 11/09/2020   CKD stage 3 due to type 2 diabetes mellitus (HCC) 12/14/2019   Asymptomatic bilateral carotid artery stenosis 12/14/2019   Abdominal distention    CAD s/p CABG in 2009 12/14/2018   Acute renal failure superimposed on stage 3 chronic kidney disease (HCC) 12/14/2018   UTI (urinary tract infection) 12/14/2018   HLD (hyperlipidemia) 12/14/2018   Sepsis (HCC) 12/14/2018   Abnormal LFTs    S/P CABG (coronary artery bypass graft) 12/06/2018   Asymptomatic stenosis of right carotid artery 12/06/2018   Claudication in peripheral vascular disease (HCC) 12/06/2018   Orthostatic hypotension due to diabetic dysautonomia 12/06/2018   Elevated liver enzymes 12/06/2018   Abdominal pain 10/12/2018   Abnormal liver function tests 10/12/2018   Foreign body in stomach 10/12/2018   Primary hypertension    GERD (gastroesophageal reflux disease)    Diabetes mellitus without complication (HCC)    Atherosclerosis of native coronary artery of native heart without angina pectoris     PCP: Eloisa Northern, MD   REFERRING PROVIDER: Nadara Mustard, MD  ONSET DATE: 09/14/2023 Prosthesis delivery  REFERRING DIAG: B14.782 (ICD-10-CM) - Left below-knee amputee   THERAPY DIAG:  Other abnormalities of gait and mobility  Unsteadiness on feet  Dizziness and  giddiness  Muscle weakness (generalized)  Impaired functional mobility, balance, gait, and endurance  Rationale for Evaluation and Treatment: Rehabilitation  SUBJECTIVE:   SUBJECTIVE STATEMENT: He was able to do 4 square stepping over Theraband with counter behind him and chairs on each side.  He had some balance issues touching counter or chairs with no issues or assistance needed. The area on shin continues to be completely healed wearing prosthesis most of awake hours without liner liner Vivewear sock.   PERTINENT HISTORY: Left TTA 06/24/23, DM2, retinopathy, GERD, HLD, HTN, CAD, CABG 2009, PAD, prostate CA, CKD stage 3b, arthritis, Laminectomy & Foraminotomy bilateral L4-5,   PAIN:  Are you having pain? No, 0/10 pain.  PRECAUTIONS: Fall  WEIGHT BEARING RESTRICTIONS: No  FALLS: Has patient fallen in last 6 months? Yes. Number of falls 1 no injuries  LIVING ENVIRONMENT: Lives with: lives with their family Lives in: House Home Access: Ramped entrance and single  step Home layout: Two level, 1/2 bath on main level, and bedroom Stairs: Yes: Internal: 14 steps; on left going up and can reach both and External: 1+1 steps; none Has following equipment at home: Quad cane small base, Environmental consultant - 2 wheeled, Wheelchair (manual), shower chair, Shower bench, and bed side commode  OCCUPATION: retired from [employer]  PLOF: Independent used cane & walker   PATIENT GOALS: to use prosthesis to walk in home & community, family wants to be safe,  go upstairs to shower  OBJECTIVE:   POSTURE: Evaluation on 09/16/2023:  rounded shoulders, forward head, flexed trunk , and weight shift right  LOWER EXTREMITY ROM:  ROM P:passive  A:active Right 09/24/23 Left 09/24/23  Hip flexion    Hip extension Maisie Fus -5* Thomas -11*  Hip abduction    Hip adduction    Hip internal rotation    Hip external rotation    Knee flexion    Knee extension  Supine P: -7*  Ankle dorsiflexion    Ankle  plantarflexion    Ankle inversion    Ankle eversion     (Blank rows = not tested)  LOWER EXTREMITY MMT:  MMT Left 09/24/23  Hip flexion   Hip extension Sidelying 3-/5  Hip abduction Sidleying 3/5  Hip adduction   Hip internal rotation   Hip external rotation   Knee flexion   Knee extension 3-/5  Ankle dorsiflexion   Ankle plantarflexion   Ankle inversion   Ankle eversion   At Evaluation all strength testing is grossly seated and functionally standing / gait. (Blank rows = not tested)  TRANSFERS: Evaluation on 09/16/2023:  Sit to stand: SBA 20" w/c with armrest to RW limited use of LLE Stand to sit: SBA RW to 20" w/c with armrest limited use of LLE  FUNCTIONAL TESTs:  12/08/2023: Sharlene Motts Balance 39/56 TUG: no device standard 29.28 sec;  with cane std 25.68 sec and cognitive 39.75 sec naming states (stopped naming during turns & repeated 2 states) 4-square step test with cane:  30.68   cane & TTA prosthesis, modA for backward step & minA all other directions  32.62   cane & TTA prosthesis, modA for backward step & minA all other directions    11/09/2023 Berg: 35/56   TUG trial 1: 38sec 2 stumbles with pt able to correct without therapist intervention, trial 2: 34sec no stumbles, MinA to CGA  09/24/2023:  Patient able to stand up from 20" w/c with armrest with RW for stabilization with supervision.  Patient able to maintain upright with RW support without assistance.  Patient able to balance without upper extremity support with minA for 60 seconds.  Deferred at evaluation on 09/16/2023 as limb too edematous to don prosthesis  GAIT: 12/10/2023: 6 Minute Walk Test: with cane 740' with supervision.    Resting HR 73 SpO2 98%  after gait HR 119  SpO2 99%  Modified Borg scale for dyspnea: 3: moderate shortness of breath or breathing difficulty -pt neg ramp & curb with cane with minA for balance.  He neg ramp & curb with RW safely.  -pt neg 11 steps with single rail & cane with  supervision with step-to pattern.   12/08/2023: Functional Gait Assessment with cane: 5/30 indicating high fall risk.  Gait velocity:  with TTA prosthesis & cane stand alone tip comfortable /self-selected 1.66 ft/sec with SBA and fast pace 2.26 ft/sec with minA.   11/17/2023: Ambulated with stand alone cane CGA with bouts of SBA, step through  pattern with inconsistent gait cadence. Was also able to walk short distances without AD and CGA with vc to slow gait down for less instances of tripping.  11/09/2023:  Self selected gait speed: 1.46 ft/sec with stand alone cane, MinA  09/24/2023:   Patient ambulated 50 feet with RW with CGA.  Initially patient walking with a step to pattern with minimal weight shift onto prosthesis.   Deferred at evaluation on 09/16/2023 as limb too edematous to don prosthesis Gait pattern:  Distance walked:  Assistive device utilized:  Level of assistance:  Comments:   PROSTHETIC WEAR ASSESSMENT: 12/08/2023: Pt tolerates wear >90% of awake hours with no skin issues (larger wound that he initially had on tibial crest is completely healed) and reports no residual limb pain.  Pt & dtr verbalize proper prosthetic care with skin check, residual limb care, care of non-amputated limb, prosthetic cleaning, ply sock cleaning, correct ply sock adjustment, proper wear schedule/adjustment, and proper weight-bearing schedule/adjustment.  He requires cues to problem solving new prosthetic issues.    11/11/2023: Patient is wearing prosthesis all awake hours and checking / drying or switching Vivewear under liner every 5 hours.  Wound has one superficial scab 2mm & 3mm at distal portion. Remainder is healed.     Evaluation on 09/16/2023:  Patient is dependent with: skin check, residual limb care, care of non-amputated limb, prosthetic cleaning, ply sock cleaning, correct ply sock adjustment, proper wear schedule/adjustment, and proper weight-bearing schedule/adjustment Donning  prosthesis: Max A Doffing prosthesis: Min A Prosthetic wear tolerance: 30-45 min 2 days ago when prosthesis delivered.  Prosthetic weight bearing tolerance: 3 minutes partial weight bearing trying to engage pin suspension.  Pt reports distal lateral limb pain from wear 2 days ago.  Edema: pitting edema  Residual limb condition: scab dry over tibial crest 5cm wide X 15 cm long.  Dry skin, normal color & temperature. Cylindrical shape Prosthetic description: silicon liner with anterior portion 9mm thick, secondary pelite foam liner, total contact socket,     TODAY'S TREATMENT:                                                                                                                             DATE:  12/21/2023: Prosthetic Training with left Transtibial Amputation: Resistive gait with blue theraband walking with cane outward against resistance and controlled return;  resistance anterior, posterior, right & left with SBA.  Pt verbalizes how to do as HEP.  Pt questioning if he needs to continue to exercise after finishes PT.  PT reviewed need for ongoing exercises to address flexibility, strength, balance and muscle endurance with target 2-3 days /wk.  Pt verbalized understanding.  Stepping around obstacles 8' apart in square with forward, sidestepping to right, backward and side stepping to left. CW & CCW with cane with CGA and without device with minA especially backwards.   Weaving around 4 obstacles 8' apart 4 reps with cane with supervision and 4 reps without  device with CGA. Pt able to self correct one balance loss.   Stepping over 6" hurdle 4 reps with cane with minA. PT demo & verbal cues on sequencing / technique.  Pt neg 12* ramp with cane with minA for balance loss.  Pt neg 6.5" curb with cane with CGA.  PT verbal & tactile cues on technique.    TREATMENT:                                                                                                                             DATE:   12/16/2023: Prosthetic Training with left Transtibial Amputation: PT educated pt and dtr in need for ability to ambulate when not fully focused on gait.  Pt amb with cane 450' naming foods A-Z.  He needed cues to perform cognitive task while ambulating.  No loss of balance.  Pt & dtr verbalized understanding of activity as HEP.   Resistive gait with blue theraband walking with cane outward against resistance and controlled return;  resistance anterior, posterior, right & left with CGA.  Neuromuscular Re-education: 4-square stepping over T-band on floor with counter posteriorly, chair backs laterally and PT supervision anteriorly.  CW & CCW 3 reps ea.  PT demo & verbal cues on set up above for safety and recommendation for performing 5 reps 3-5 days / wk.     TREATMENT:                                                                                                                             DATE:  12/10/2023: Prosthetic Training with left Transtibial Amputation: See objective data / gait. PT reviewed results with pt & dtr. Pt's granddaughter is getting married in their backyard 01/27/24.  PT recommended that his daughter count her steps to walk from house to where service is going to be.  PT had her walk 20' which took 10 steps.  So if we double number of steps that should be close to feet required.  He would also like to dance one dance with his granddaughter.  PT discussed shoes and they plan to still use tennis shoes not men's dress shoes.  PT will build this into his functional activities.   PT reviewed short, medium and long walk for improving activity tolerance.  Pt & dtr verbalized understanding.      PATIENT EDUCATION: PATIENT EDUCATED ON FOLLOWING PROSTHETIC CARE: Education details:  Skin check, Residual limb care, Prosthetic cleaning, Correct ply sock adjustment, Propper donning, and Proper wear schedule/adjustment Prosthetic wear tolerance: liner only initially 2 hours 3x/day, 7  days/week Person educated: Patient and Child(ren) Education method: Explanation, Demonstration, Tactile cues, and Verbal cues Education comprehension: dtr & granddaughter verbalized understanding, verbal cues required, tactile cues required, and needs further education  HOME EXERCISE PROGRAM:  ASSESSMENT:  CLINICAL IMPRESSION: Patient is improving with function and balance.  He appears to understand resistive gait activity and need for ongoing HEP.  Patient will continue to benefit from continued skilled physical therapy to address deficits.    OBJECTIVE IMPAIRMENTS: Abnormal gait, decreased activity tolerance, decreased balance, decreased endurance, decreased knowledge of condition, decreased knowledge of use of DME, decreased mobility, difficulty walking, decreased ROM, decreased strength, increased edema, prosthetic dependency , and pain.   ACTIVITY LIMITATIONS: carrying, lifting, sitting, standing, stairs, transfers, and locomotion level  PARTICIPATION LIMITATIONS: standing ADLS, cleaning, community activity, and household activity  PERSONAL FACTORS: Age, Fitness, Past/current experiences, Time since onset of injury/illness/exacerbation, and 3+ comorbidities: see PMH  are also affecting patient's functional outcome.   REHAB POTENTIAL: Good  CLINICAL DECISION MAKING: Evolving/moderate complexity  EVALUATION COMPLEXITY: Moderate   GOALS: Goals reviewed with patient? Yes  SHORT TERM GOALS: Target date: 01/11/2024  Patient verbalizes understanding of updated HEP. Baseline: SEE OBJECTIVE DATA Goal status: Ongoing   12/21/2023 2.  Timed Up & Go with cane <22 sec Baseline: SEE OBJECTIVE DATA Goal status: Ongoing   12/21/2023  3.  Patient ambulates 150' with cane scanning environment maintaining path.  Baseline: SEE OBJECTIVE DATA Goal status: Ongoing   12/21/2023    UPDATED LONG TERM GOALS: Target date: 03/09/2024  Patient & family demonstrates & verbalizes ongoing  understanding of prosthetic care including problem solving issues & tolerates wear >90% of awake hours without skin or limb pain issues to enable safe utilization of prosthesis. Baseline: SEE OBJECTIVE DATA Goal status: Ongoing   33/24/2025  Timed Up & Go without Assistive device <30 sec safely. Baseline: SEE OBJECTIVE DATA Goal status: Ongoing   12/21/2023  Berg Balance >/= 45/56 to indicate lower fall risk with standing ADLs.  Baseline: SEE OBJECTIVE DATA Goal status:  Ongoing   12/21/2023  4.  Patient ambulates 62' with prosthesis only carrying plate & cup modified independent. Baseline: SEE OBJECTIVE DATA Goal status: Ongoing   12/21/2023  5. 6 Minute Walk Test >900' modified independent with prosthesis & cane. Baseline: SEE OBJECTIVE DATA Goal status:  Ongoing   12/21/2023  6.  Patient negotiates ramps, curbs & stairs with single rail with prosthesis & cane modified independent.  Baseline: SEE OBJECTIVE DATA Goal status:  Ongoing   12/21/2023  7.  Patient demonstrates improved balance with 4-Square Step Test with cane <25 sec safely  Baseline: SEE OBJECTIVE DATA Goal status:  Ongoing   12/21/2023 PLAN:  PT FREQUENCY: 1-2x/wk  PT DURATION: 90 days  PLANNED INTERVENTIONS: 97164- PT Re-evaluation, 97110-Therapeutic exercises, 97530- Therapeutic activity, 97112- Neuromuscular re-education, [phone]- Self Care, 21308- Gait training, [phone]- Prosthetic training, Patient/Family education, Balance training, Stair training, DME instructions, and physical performance testing  PLAN FOR NEXT SESSION:   check STGs and request more [employer] visits, balance activities.   Vladimir Faster, PT, DPT 12/21/2023, 11:07 AM   Date of referral: 08/31/2023 Referring provider: Aldean Baker, MD Referring diagnosis? O96.295 (ICD-10-CM) - Left below-knee amputee  Treatment diagnosis? (if different than referring diagnosis)  Other abnormalities of gait and mobility  ICD-10-CM: R26.89   Unsteadiness on  feet   ICD-10-CM: R26.81   Muscle weakness (generalized)  ICD-10-CM: M62.81   Impaired functional mobility, balance, gait, and endurance  ICD-10-CM: Z74.09   What was this (referring dx) caused by? Surgery (Type: left BKA)  Nature of Condition: Initial Onset (within last 3 months) prosthesis delivery    Laterality: Lt  Current Functional Measure Score: 12/10/2023 Berg Balance 39/56;  11/09/2023 Berg Balance 35/56;  at evalation was standing balance supervision static stance 1 min.   Objective measurements identify impairments when they are compared to normal values, the uninvolved extremity, and prior level of function.  [x]  Yes  []  No  Objective assessment of functional ability: Moderate functional limitations   Briefly describe symptoms: Eval was patient is dependent in prosthetic care & use.  He has impaired mobility. Patient is deconditioned from prolonged limited activity with wound, then amputation. 11/11/2023: patient is wearing prosthesis most of awake hours and wound is almost completely healed. Pt has general understanding of prosthetic care.  He is ambulating in home with cane and community with RW.  Previously was w/c bound.  12/10/2023: patient is functioning with family with cane in community.  Functional Outcome Tests indicate high fall risk and he has potential to decrease risk with PT.   How did symptoms start: infected wound led to amputation  Average pain intensity:  Last 24 hours: 0  Past week: 0  How often does the pt experience symptoms? Constantly  How much have the symptoms interfered with usual daily activities? Extremely  How has condition changed since care began at this facility? Improved - Better  In general, how is the patients overall health? Good   BACK PAIN (STarT Back Screening Tool) No

## 2023-12-28 ENCOUNTER — Encounter: Payer: Self-pay | Admitting: Physical Therapy

## 2023-12-28 ENCOUNTER — Ambulatory Visit: Admitting: Physical Therapy

## 2023-12-28 DIAGNOSIS — M6281 Muscle weakness (generalized): Secondary | ICD-10-CM

## 2023-12-28 DIAGNOSIS — R2681 Unsteadiness on feet: Secondary | ICD-10-CM | POA: Diagnosis not present

## 2023-12-28 DIAGNOSIS — Z7409 Other reduced mobility: Secondary | ICD-10-CM

## 2023-12-28 DIAGNOSIS — R2689 Other abnormalities of gait and mobility: Secondary | ICD-10-CM | POA: Diagnosis not present

## 2023-12-28 DIAGNOSIS — R42 Dizziness and giddiness: Secondary | ICD-10-CM | POA: Diagnosis not present

## 2023-12-28 NOTE — Therapy (Signed)
 OUTPATIENT PHYSICAL THERAPY PROSTHETIC TREATMENT   Patient Name: Justin [name] MRN: [id number] DOB:[birth year], 89 y.o., male Today's Date: 12/28/2023  END OF SESSION:  PT End of Session - 12/28/23 1054     Visit Number 19    Number of Visits 35    Date for PT Re-Evaluation 03/09/24    Authorization Type [employer] Medicare    Authorization Time Period $20 COPAY 4 PT VISITS 12/10/23 - 01/07/2024    Authorization - Visit Number 4    Authorization - Number of Visits 4    Progress Note Due on Visit 26    PT Start Time 1057    PT Stop Time 1135    PT Time Calculation (min) 38 min    Equipment Utilized During Treatment Gait belt    Activity Tolerance Patient tolerated treatment well    Behavior During Therapy WFL for tasks assessed/performed                        $20 COPAY 16 PT VISITS APPROVED 12/18-2/12/25   Past Medical History:  Diagnosis Date   Anxiety    Arthritis    Carotid arterial disease (HCC)    CKD (chronic kidney disease)    Coronary artery disease    Diabetes mellitus without complication (HCC)    Diabetic retinopathy (HCC)    NPDR OU   Diverticulitis    Dyspnea    GERD (gastroesophageal reflux disease)    History of kidney stones 2021   Hypercholesteremia    Hypertension    Hypertensive retinopathy    OU   Myocardial infarction [facility]) 2009   Pneumonia    as a child   Prostate cancer (HCC)    UTI (lower urinary tract infection)    Past Surgical History:  Procedure Laterality Date   ABDOMINAL AORTOGRAM W/LOWER EXTREMITY N/A 06/22/2023   Procedure: ABDOMINAL AORTOGRAM W/LOWER EXTREMITY;  Surgeon: Maeola Harman, MD;  Location: [facility] INVASIVE CV LAB;  Service: Cardiovascular;  Laterality: N/A;   ABDOMINAL SURGERY     for diverticulitis; also removed appendix   AMPUTATION Left 06/24/2023   Procedure: LEFT BELOW THE KNEE AMPUTATION;  Surgeon: Nadara Mustard, MD;  Location: [facility] OR;  Service: Orthopedics;  Laterality: Left;   APPENDECTOMY      CARDIAC CATHETERIZATION  2018   CATARACT EXTRACTION     CATARACT EXTRACTION, BILATERAL     CHOLECYSTECTOMY N/A 10/12/2017   Procedure: LAPAROSCOPIC CHOLECYSTECTOMY;  Surgeon: Abigail Miyamoto, MD;  Location: [facility] OR;  Service: General;  Laterality: N/A;   CORONARY ARTERY BYPASS GRAFT  2009   ERCP N/A 12/21/2018   Procedure: ENDOSCOPIC RETROGRADE CHOLANGIOPANCREATOGRAPHY (ERCP);  Surgeon: Jeani Hawking, MD;  Location: [facility] ENDOSCOPY;  Service: Endoscopy;  Laterality: N/A;   ESOPHAGOGASTRODUODENOSCOPY (EGD) WITH PROPOFOL N/A 12/17/2018   Procedure: ESOPHAGOGASTRODUODENOSCOPY (EGD) WITH PROPOFOL;  Surgeon: Jeani Hawking, MD;  Location: [facility] ENDOSCOPY;  Service: Endoscopy;  Laterality: N/A;   EUS Left 12/17/2018   Procedure: UPPER ENDOSCOPIC ULTRASOUND (EUS) LINEAR;  Surgeon: Jeani Hawking, MD;  Location: [facility] ENDOSCOPY;  Service: Endoscopy;  Laterality: Left;   EYE SURGERY     "for bleeding in eye"   HERNIA REPAIR     LEFT HEART CATH AND CORONARY ANGIOGRAPHY N/A 11/04/2016   Procedure: Left Heart Cath and Coronary Angiography;  Surgeon: Yates Decamp, MD;  Location: [facility] INVASIVE CV LAB;  Service: Cardiovascular;  Laterality: N/A;   LOWER EXTREMITY ANGIOGRAPHY N/A 11/04/2016   Procedure: Lower Extremity Angiography;  Surgeon: Yates Decamp, MD;  Location: [facility] INVASIVE CV LAB;  Service: Cardiovascular;  Laterality: N/A;   LOWER EXTREMITY ANGIOGRAPHY N/A 01/03/2020   Procedure: LOWER EXTREMITY ANGIOGRAPHY;  Surgeon: Yates Decamp, MD;  Location: [facility] INVASIVE CV LAB;  Service: Cardiovascular;  Laterality: N/A;   LOWER EXTREMITY ANGIOGRAPHY Bilateral 01/14/2022   Procedure: Lower Extremity Angiography;  Surgeon: Yates Decamp, MD;  Location: [facility] INVASIVE CV LAB;  Service: Cardiovascular;  Laterality: Bilateral;   LUMBAR LAMINECTOMY/DECOMPRESSION MICRODISCECTOMY Bilateral 11/09/2020   Procedure: Laminectomy and Foraminotomy - bilateral - Lumbar Four-Five.;  Surgeon: Julio Sicks, MD;  Location: [facility] OR;  Service: Neurosurgery;   Laterality: Bilateral;  posterior   PROSTATE SURGERY     REMOVAL OF STONES  12/21/2018   Procedure: REMOVAL OF STONES;  Surgeon: Jeani Hawking, MD;  Location: [facility] ENDOSCOPY;  Service: Endoscopy;;   SPHINCTEROTOMY  12/21/2018   Procedure: Dennison Mascot;  Surgeon: Jeani Hawking, MD;  Location: [facility] ENDOSCOPY;  Service: Endoscopy;;   Patient Active Problem List   Diagnosis Date Noted   Osteomyelitis of great toe of left foot (HCC) 06/20/2023   Gangrene of toe of left foot (HCC) 06/19/2023   Normocytic anemia 06/19/2023   Eschar of left great toe 05/22/2023   Near syncope 06/15/2021   Dizziness 06/15/2021   Lumbar stenosis with neurogenic claudication 11/09/2020   CKD stage 3 due to type 2 diabetes mellitus (HCC) 12/14/2019   Asymptomatic bilateral carotid artery stenosis 12/14/2019   Abdominal distention    CAD s/p CABG in 2009 12/14/2018   Acute renal failure superimposed on stage 3 chronic kidney disease (HCC) 12/14/2018   UTI (urinary tract infection) 12/14/2018   HLD (hyperlipidemia) 12/14/2018   Sepsis (HCC) 12/14/2018   Abnormal LFTs    S/P CABG (coronary artery bypass graft) 12/06/2018   Asymptomatic stenosis of right carotid artery 12/06/2018   Claudication in peripheral vascular disease (HCC) 12/06/2018   Orthostatic hypotension due to diabetic dysautonomia 12/06/2018   Elevated liver enzymes 12/06/2018   Abdominal pain 10/12/2018   Abnormal liver function tests 10/12/2018   Foreign body in stomach 10/12/2018   Primary hypertension    GERD (gastroesophageal reflux disease)    Diabetes mellitus without complication (HCC)    Atherosclerosis of native coronary artery of native heart without angina pectoris     PCP: Eloisa Northern, MD   REFERRING PROVIDER: Nadara Mustard, MD  ONSET DATE: 09/14/2023 Prosthesis delivery  REFERRING DIAG: Z61.096 (ICD-10-CM) - Left below-knee amputee   THERAPY DIAG:  Other abnormalities of gait and mobility  Unsteadiness on feet  Dizziness  and giddiness  Muscle weakness (generalized)  Impaired functional mobility, balance, gait, and endurance  Rationale for Evaluation and Treatment: Rehabilitation  SUBJECTIVE:   SUBJECTIVE STATEMENT: He has been walking on paved surfaces with cane and is increasing distance.  He thinks that PT is helping him to get better and needs more to work on his balance.    PERTINENT HISTORY: Left TTA 06/24/23, DM2, retinopathy, GERD, HLD, HTN, CAD, CABG 2009, PAD, prostate CA, CKD stage 3b, arthritis, Laminectomy & Foraminotomy bilateral L4-5,   PAIN:  Are you having pain? No, 0/10 pain.  PRECAUTIONS: Fall  WEIGHT BEARING RESTRICTIONS: No  FALLS: Has patient fallen in last 6 months? Yes. Number of falls 1 no injuries  LIVING ENVIRONMENT: Lives with: lives with their family Lives in: House Home Access: Ramped entrance and single step Home layout: Two level, 1/2 bath on main level, and bedroom Stairs: Yes: Internal: 14 steps; on left going up  and can reach both and External: 1+1 steps; none Has following equipment at home: Quad cane small base, Environmental consultant - 2 wheeled, Wheelchair (manual), shower chair, Shower bench, and bed side commode  OCCUPATION: retired from [employer]  PLOF: Independent used cane & walker   PATIENT GOALS: to use prosthesis to walk in home & community, family wants to be safe,  go upstairs to shower  OBJECTIVE:   POSTURE: Evaluation on 09/16/2023:  rounded shoulders, forward head, flexed trunk , and weight shift right  LOWER EXTREMITY ROM:  ROM P:passive  A:active Right 09/24/23 Left 09/24/23  Hip flexion    Hip extension Maisie Fus -5* Thomas -11*  Hip abduction    Hip adduction    Hip internal rotation    Hip external rotation    Knee flexion    Knee extension  Supine P: -7*  Ankle dorsiflexion    Ankle plantarflexion    Ankle inversion    Ankle eversion     (Blank rows = not tested)  LOWER EXTREMITY MMT:  MMT Left 09/24/23  Hip flexion   Hip  extension Sidelying 3-/5  Hip abduction Sidleying 3/5  Hip adduction   Hip internal rotation   Hip external rotation   Knee flexion   Knee extension 3-/5  Ankle dorsiflexion   Ankle plantarflexion   Ankle inversion   Ankle eversion   At Evaluation all strength testing is grossly seated and functionally standing / gait. (Blank rows = not tested)  TRANSFERS: Evaluation on 09/16/2023:  Sit to stand: SBA 20" w/c with armrest to RW limited use of LLE Stand to sit: SBA RW to 20" w/c with armrest limited use of LLE  FUNCTIONAL TESTs:  12/28/2203:  TUG: no device standard 18.28 sec;  with cane std 19.68 sec  12/08/2023: Berg Balance 39/56 TUG: no device standard 29.28 sec;  with cane std 25.68 sec and cognitive 39.75 sec naming states (stopped naming during turns & repeated 2 states) 4-square step test with cane:  30.68   cane & TTA prosthesis, modA for backward step & minA all other directions  32.62   cane & TTA prosthesis, modA for backward step & minA all other directions    11/09/2023 Berg: 35/56   TUG trial 1: 38sec 2 stumbles with pt able to correct without therapist intervention, trial 2: 34sec no stumbles, MinA to CGA  09/24/2023:  Patient able to stand up from 20" w/c with armrest with RW for stabilization with supervision.  Patient able to maintain upright with RW support without assistance.  Patient able to balance without upper extremity support with minA for 60 seconds.  Deferred at evaluation on 09/16/2023 as limb too edematous to don prosthesis  GAIT: 12/28/2023:   Gait velocity:  with TTA prosthesis & cane stand alone tip comfortable /self-selected 2.01 ft/sec with SBA and fast pace 2.51 ft/sec with SBA Pt amb 150' with cane scanning environment was able to maintain path with slight decrease in speed.  Previously he either weaved from path or completely stopped to look.    12/10/2023: 6 Minute Walk Test: with cane 740' with supervision.    Resting HR 73 SpO2 98%   after gait HR 119  SpO2 99%  Modified Borg scale for dyspnea: 3: moderate shortness of breath or breathing difficulty -pt neg ramp & curb with cane with minA for balance.  He neg ramp & curb with RW safely.  -pt neg 11 steps with single rail & cane with supervision with step-to pattern.  12/08/2023: Functional Gait Assessment with cane: 5/30 indicating high fall risk.  Gait velocity:  with TTA prosthesis & cane stand alone tip comfortable /self-selected 1.66 ft/sec with SBA and fast pace 2.26 ft/sec with minA.   11/17/2023: Ambulated with stand alone cane CGA with bouts of SBA, step through pattern with inconsistent gait cadence. Was also able to walk short distances without AD and CGA with vc to slow gait down for less instances of tripping.  11/09/2023:  Self selected gait speed: 1.46 ft/sec with stand alone cane, MinA  09/24/2023:   Patient ambulated 50 feet with RW with CGA.  Initially patient walking with a step to pattern with minimal weight shift onto prosthesis.   Deferred at evaluation on 09/16/2023 as limb too edematous to don prosthesis Gait pattern:  Distance walked:  Assistive device utilized:  Level of assistance:  Comments:   PROSTHETIC WEAR ASSESSMENT: 12/08/2023: Pt tolerates wear >90% of awake hours with no skin issues (larger wound that he initially had on tibial crest is completely healed) and reports no residual limb pain.  Pt & dtr verbalize proper prosthetic care with skin check, residual limb care, care of non-amputated limb, prosthetic cleaning, ply sock cleaning, correct ply sock adjustment, proper wear schedule/adjustment, and proper weight-bearing schedule/adjustment.  He requires cues to problem solving new prosthetic issues.    11/11/2023: Patient is wearing prosthesis all awake hours and checking / drying or switching Vivewear under liner every 5 hours.  Wound has one superficial scab 2mm & 3mm at distal portion. Remainder is healed.     Evaluation on  09/16/2023:  Patient is dependent with: skin check, residual limb care, care of non-amputated limb, prosthetic cleaning, ply sock cleaning, correct ply sock adjustment, proper wear schedule/adjustment, and proper weight-bearing schedule/adjustment Donning prosthesis: Max A Doffing prosthesis: Min A Prosthetic wear tolerance: 30-45 min 2 days ago when prosthesis delivered.  Prosthetic weight bearing tolerance: 3 minutes partial weight bearing trying to engage pin suspension.  Pt reports distal lateral limb pain from wear 2 days ago.  Edema: pitting edema  Residual limb condition: scab dry over tibial crest 5cm wide X 15 cm long.  Dry skin, normal color & temperature. Cylindrical shape Prosthetic description: silicon liner with anterior portion 9mm thick, secondary pelite foam liner, total contact socket,     TODAY'S TREATMENT:                                                                                                                             DATE:  12/28/2023: Prosthetic Training with left Transtibial Amputation: TUG: no device standard 18.28 sec (on 12/08/23 was 29.28 sec);  with cane std 19.68 sec (on 12/08/23 was 25.68 sec) Gait velocity:  with TTA prosthesis & cane stand alone tip comfortable /self-selected 2.01 ft/sec with SBA and fast pace 2.51 ft/sec with SBA.  (On 12/08/2023 was 1.66 ft/sec with SBA and fast pace 2.26 ft/sec with minA.) PT reviewed significance of improvements above and  pt / dtr verbalized understanding.  Pt amb 150' with cane scanning environment was able to maintain path with slight decrease in speed.  Previously he either weaved from path or completely stopped to look.   Stairs to increase functional strength of LLE.  2 rails descending & ascending with step-to pattern alternating lead LE.  PT demo & verbal cues on modifications with prosthesis for knee control.  Side stepping with 2 hands on one rail (did with both right & left rail).  Pt & dtr verbalized  understanding & rationale as HEP.  PT recommended placing 3 chairs in backyard: 1 chair where he will sit for wedding, 1 half way & 1 75% of way.  Try to walk with cane & family to target chair but if he fatigues sit in one of the other chairs.  PT reviewed how to provide HHA as needed. Pt & dtr verbalized understanding.     TREATMENT:                                                                                                                             DATE:  12/21/2023: Prosthetic Training with left Transtibial Amputation: Resistive gait with blue theraband walking with cane outward against resistance and controlled return;  resistance anterior, posterior, right & left with SBA.  Pt verbalizes how to do as HEP.  Pt questioning if he needs to continue to exercise after finishes PT.  PT reviewed need for ongoing exercises to address flexibility, strength, balance and muscle endurance with target 2-3 days /wk.  Pt verbalized understanding.  Stepping around obstacles 8' apart in square with forward, sidestepping to right, backward and side stepping to left. CW & CCW with cane with CGA and without device with minA especially backwards.   Weaving around 4 obstacles 8' apart 4 reps with cane with supervision and 4 reps without device with CGA. Pt able to self correct one balance loss.   Stepping over 6" hurdle 4 reps with cane with minA. PT demo & verbal cues on sequencing / technique.  Pt neg 12* ramp with cane with minA for balance loss.  Pt neg 6.5" curb with cane with CGA.  PT verbal & tactile cues on technique.    TREATMENT:  DATE:  12/16/2023: Prosthetic Training with left Transtibial Amputation: PT educated pt and dtr in need for ability to ambulate when not fully focused on gait.  Pt amb with cane 450' naming foods A-Z.  He needed cues to perform cognitive task while  ambulating.  No loss of balance.  Pt & dtr verbalized understanding of activity as HEP.   Resistive gait with blue theraband walking with cane outward against resistance and controlled return;  resistance anterior, posterior, right & left with CGA.  Neuromuscular Re-education: 4-square stepping over T-band on floor with counter posteriorly, chair backs laterally and PT supervision anteriorly.  CW & CCW 3 reps ea.  PT demo & verbal cues on set up above for safety and recommendation for performing 5 reps 3-5 days / wk.     TREATMENT:                                                                                                                             DATE:  12/10/2023: Prosthetic Training with left Transtibial Amputation: See objective data / gait. PT reviewed results with pt & dtr. Pt's granddaughter is getting married in their backyard 01/27/24.  PT recommended that his daughter count her steps to walk from house to where service is going to be.  PT had her walk 20' which took 10 steps.  So if we double number of steps that should be close to feet required.  He would also like to dance one dance with his granddaughter.  PT discussed shoes and they plan to still use tennis shoes not men's dress shoes.  PT will build this into his functional activities.   PT reviewed short, medium and long walk for improving activity tolerance.  Pt & dtr verbalized understanding.      PATIENT EDUCATION: PATIENT EDUCATED ON FOLLOWING PROSTHETIC CARE: Education details:   Skin check, Residual limb care, Prosthetic cleaning, Correct ply sock adjustment, Propper donning, and Proper wear schedule/adjustment Prosthetic wear tolerance: liner only initially 2 hours 3x/day, 7 days/week Person educated: Patient and Child(ren) Education method: Explanation, Demonstration, Tactile cues, and Verbal cues Education comprehension: dtr & granddaughter verbalized understanding, verbal cues required, tactile cues required, and  needs further education  HOME EXERCISE PROGRAM:  ASSESSMENT:  CLINICAL IMPRESSION: Patient has progressed with balance and mobility since re-evaluation.  He has potential to improve further which would improve his mobility and reduce fall risk further.  PT continues to progress activities by instructing weekly in progressive activities with set-up to ensure safety.  Patient will continue to benefit from continued skilled physical therapy to address deficits.    OBJECTIVE IMPAIRMENTS: Abnormal gait, decreased activity tolerance, decreased balance, decreased endurance, decreased knowledge of condition, decreased knowledge of use of DME, decreased mobility, difficulty walking, decreased ROM, decreased strength, increased edema, prosthetic dependency , and pain.   ACTIVITY LIMITATIONS: carrying, lifting, sitting, standing, stairs, transfers, and locomotion level  PARTICIPATION LIMITATIONS: standing ADLS, cleaning, community activity, and household activity  PERSONAL FACTORS: Age, Fitness, Past/current experiences, Time since onset of injury/illness/exacerbation, and 3+ comorbidities: see PMH  are also affecting patient's functional outcome.   REHAB POTENTIAL: Good  CLINICAL DECISION MAKING: Evolving/moderate complexity  EVALUATION COMPLEXITY: Moderate   GOALS: Goals reviewed with patient? Yes  SHORT TERM GOALS: Target date: 01/11/2024  Patient verbalizes understanding of updated HEP. Baseline: SEE OBJECTIVE DATA Goal status: MET  12/28/2023 2.  Timed Up & Go with cane <22 sec Baseline: SEE OBJECTIVE DATA Goal status: MET  12/28/2023  3.  Patient ambulates 150' with cane scanning environment maintaining path.  Baseline: SEE OBJECTIVE DATA Goal status: MET    12/28/2023    UPDATED LONG TERM GOALS: Target date: 03/09/2024  Patient & family demonstrates & verbalizes ongoing understanding of prosthetic care including problem solving issues & tolerates wear >90% of awake hours without  skin or limb pain issues to enable safe utilization of prosthesis. Baseline: SEE OBJECTIVE DATA Goal status: Ongoing    12/28/2023  Timed Up & Go without Assistive device <30 sec safely. Baseline: SEE OBJECTIVE DATA Goal status: Ongoing    12/28/2023  Berg Balance >/= 45/56 to indicate lower fall risk with standing ADLs.  Baseline: SEE OBJECTIVE DATA Goal status:  Ongoing    12/28/2023  4.  Patient ambulates 68' with prosthesis only carrying plate & cup modified independent. Baseline: SEE OBJECTIVE DATA Goal status: Ongoing    12/28/2023  5. 6 Minute Walk Test >900' modified independent with prosthesis & cane. Baseline: SEE OBJECTIVE DATA Goal status:  Ongoing    12/28/2023  6.  Patient negotiates ramps, curbs & stairs with single rail with prosthesis & cane modified independent.  Baseline: SEE OBJECTIVE DATA Goal status:  Ongoing   12/28/2023  7.  Patient demonstrates improved balance with 4-Square Step Test with cane <25 sec safely  Baseline: SEE OBJECTIVE DATA Goal status:  Ongoing   12/28/2023 PLAN:  PT FREQUENCY: 1-2x/wk  PT DURATION: 90 days  PLANNED INTERVENTIONS: 97164- PT Re-evaluation, 97110-Therapeutic exercises, 97530- Therapeutic activity, 97112- Neuromuscular re-education, 97535- Self Care, 62130- Gait training, [phone]- Prosthetic training, Patient/Family education, Balance training, Stair training, DME instructions, and physical performance testing  PLAN FOR NEXT SESSION:     check if got more [employer] visits, balance activities. Continue to progress HEP & home activities.   Vladimir Faster, PT, DPT 12/28/2023, 11:48 AM   Date of referral: 08/31/2023 Referring provider: Aldean Baker, MD Referring diagnosis? I69.629 (ICD-10-CM) - Left below-knee amputee  Treatment diagnosis? (if different than referring diagnosis)  Other abnormalities of gait and mobility  ICD-10-CM: R26.89   Unsteadiness on feet  ICD-10-CM: R26.81   Muscle weakness (generalized)  ICD-10-CM: M62.81    Impaired functional mobility, balance, gait, and endurance  ICD-10-CM: Z74.09   What was this (referring dx) caused by? Surgery (Type: left BKA)  Nature of Condition: Initial Onset (within last 3 months) prosthesis delivery    Laterality: Lt  Current Functional Measure Score: 12/10/2023 Berg Balance 39/56;  11/09/2023 Berg Balance 35/56;  at evalation was standing balance supervision static stance 1 min.  TUG: no device standard 18.28 sec (on 12/08/23 was 29.28 sec);  with cane std 19.68 sec (on 12/08/23 was 25.68 sec) Gait velocity:  with TTA prosthesis & cane stand alone tip comfortable /self-selected 2.01 ft/sec with SBA and fast pace 2.51 ft/sec with SBA.  (On 12/08/2023 was 1.66 ft/sec with SBA and fast pace 2.26 ft/sec with minA.  Objective measurements identify impairments when they are compared to normal values, the  uninvolved extremity, and prior level of function.  [x]  Yes  []  No  Objective assessment of functional ability: Moderate functional limitations   Briefly describe symptoms: Eval was patient is dependent in prosthetic care & use.  He has impaired mobility. Patient is deconditioned from prolonged limited activity with wound, then amputation. 11/11/2023: patient is wearing prosthesis most of awake hours and wound is almost completely healed. Pt has general understanding of prosthetic care.  He is ambulating in home with cane and community with RW.  Previously was w/c bound.  12/10/2023: patient is functioning with family with cane in community.  Functional Outcome Tests indicate high fall risk and he has potential to decrease risk with PT.   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 Constantly  How much have the symptoms interfered with usual daily activities? Extremely  How has condition changed since care began at this facility?  Improved - Better  In general, how is the patients overall health? Good   BACK PAIN (STarT Back Screening Tool) No

## 2024-01-04 ENCOUNTER — Encounter: Payer: Self-pay | Admitting: Physical Therapy

## 2024-01-04 ENCOUNTER — Ambulatory Visit: Admitting: Physical Therapy

## 2024-01-04 DIAGNOSIS — R42 Dizziness and giddiness: Secondary | ICD-10-CM | POA: Diagnosis not present

## 2024-01-04 DIAGNOSIS — M6281 Muscle weakness (generalized): Secondary | ICD-10-CM

## 2024-01-04 DIAGNOSIS — R2689 Other abnormalities of gait and mobility: Secondary | ICD-10-CM | POA: Diagnosis not present

## 2024-01-04 DIAGNOSIS — R2681 Unsteadiness on feet: Secondary | ICD-10-CM

## 2024-01-04 DIAGNOSIS — Z7409 Other reduced mobility: Secondary | ICD-10-CM

## 2024-01-04 NOTE — Therapy (Signed)
 OUTPATIENT PHYSICAL THERAPY PROSTHETIC TREATMENT   Patient Name: Justin [name] MRN: [id number] DOB:27-Mar-1933, 89 y.o., male Today's Date: 01/04/2024  END OF SESSION:  PT End of Session - 01/04/24 1101     Visit Number 20    Number of Visits 35    Date for PT Re-Evaluation 03/09/24    Authorization Type [employer] Medicare    Authorization Time Period $20 COPAY Visits 4  12/28/2023 - 01/25/2024    Authorization - Visit Number 1    Authorization - Number of Visits 4    Progress Note Due on Visit 26    PT Start Time 1100    PT Stop Time 1141    PT Time Calculation (min) 41 min    Equipment Utilized During Treatment Gait belt    Activity Tolerance Patient tolerated treatment well    Behavior During Therapy WFL for tasks assessed/performed                         $20 COPAY 16 PT VISITS APPROVED 12/18-2/12/25   Past Medical History:  Diagnosis Date   Anxiety    Arthritis    Carotid arterial disease (HCC)    CKD (chronic kidney disease)    Coronary artery disease    Diabetes mellitus without complication (HCC)    Diabetic retinopathy (HCC)    NPDR OU   Diverticulitis    Dyspnea    GERD (gastroesophageal reflux disease)    History of kidney stones 2021   Hypercholesteremia    Hypertension    Hypertensive retinopathy    OU   Myocardial infarction [facility]) 2009   Pneumonia    as a child   Prostate cancer (HCC)    UTI (lower urinary tract infection)    Past Surgical History:  Procedure Laterality Date   ABDOMINAL AORTOGRAM W/LOWER EXTREMITY N/A 06/22/2023   Procedure: ABDOMINAL AORTOGRAM W/LOWER EXTREMITY;  Surgeon: Maeola Harman, MD;  Location: [facility] INVASIVE CV LAB;  Service: Cardiovascular;  Laterality: N/A;   ABDOMINAL SURGERY     for diverticulitis; also removed appendix   AMPUTATION Left 06/24/2023   Procedure: LEFT BELOW THE KNEE AMPUTATION;  Surgeon: Nadara Mustard, MD;  Location: [facility] OR;  Service: Orthopedics;  Laterality: Left;   APPENDECTOMY      CARDIAC CATHETERIZATION  2018   CATARACT EXTRACTION     CATARACT EXTRACTION, BILATERAL     CHOLECYSTECTOMY N/A 10/12/2017   Procedure: LAPAROSCOPIC CHOLECYSTECTOMY;  Surgeon: Abigail Miyamoto, MD;  Location: [facility] OR;  Service: General;  Laterality: N/A;   CORONARY ARTERY BYPASS GRAFT  2009   ERCP N/A 12/21/2018   Procedure: ENDOSCOPIC RETROGRADE CHOLANGIOPANCREATOGRAPHY (ERCP);  Surgeon: Jeani Hawking, MD;  Location: [facility] ENDOSCOPY;  Service: Endoscopy;  Laterality: N/A;   ESOPHAGOGASTRODUODENOSCOPY (EGD) WITH PROPOFOL N/A 12/17/2018   Procedure: ESOPHAGOGASTRODUODENOSCOPY (EGD) WITH PROPOFOL;  Surgeon: Jeani Hawking, MD;  Location: [facility] ENDOSCOPY;  Service: Endoscopy;  Laterality: N/A;   EUS Left 12/17/2018   Procedure: UPPER ENDOSCOPIC ULTRASOUND (EUS) LINEAR;  Surgeon: Jeani Hawking, MD;  Location: [facility] ENDOSCOPY;  Service: Endoscopy;  Laterality: Left;   EYE SURGERY     "for bleeding in eye"   HERNIA REPAIR     LEFT HEART CATH AND CORONARY ANGIOGRAPHY N/A 11/04/2016   Procedure: Left Heart Cath and Coronary Angiography;  Surgeon: Yates Decamp, MD;  Location: [facility] INVASIVE CV LAB;  Service: Cardiovascular;  Laterality: N/A;   LOWER EXTREMITY ANGIOGRAPHY N/A 11/04/2016   Procedure: Lower Extremity Angiography;  Surgeon: Yates Decamp, MD;  Location: [facility] INVASIVE CV LAB;  Service: Cardiovascular;  Laterality: N/A;   LOWER EXTREMITY ANGIOGRAPHY N/A 01/03/2020   Procedure: LOWER EXTREMITY ANGIOGRAPHY;  Surgeon: Yates Decamp, MD;  Location: [facility] INVASIVE CV LAB;  Service: Cardiovascular;  Laterality: N/A;   LOWER EXTREMITY ANGIOGRAPHY Bilateral 01/14/2022   Procedure: Lower Extremity Angiography;  Surgeon: Yates Decamp, MD;  Location: [facility] INVASIVE CV LAB;  Service: Cardiovascular;  Laterality: Bilateral;   LUMBAR LAMINECTOMY/DECOMPRESSION MICRODISCECTOMY Bilateral 11/09/2020   Procedure: Laminectomy and Foraminotomy - bilateral - Lumbar Four-Five.;  Surgeon: Julio Sicks, MD;  Location: [facility] OR;  Service: Neurosurgery;   Laterality: Bilateral;  posterior   PROSTATE SURGERY     REMOVAL OF STONES  12/21/2018   Procedure: REMOVAL OF STONES;  Surgeon: Jeani Hawking, MD;  Location: [facility] ENDOSCOPY;  Service: Endoscopy;;   SPHINCTEROTOMY  12/21/2018   Procedure: Dennison Mascot;  Surgeon: Jeani Hawking, MD;  Location: [facility] ENDOSCOPY;  Service: Endoscopy;;   Patient Active Problem List   Diagnosis Date Noted   Osteomyelitis of great toe of left foot (HCC) 06/20/2023   Gangrene of toe of left foot (HCC) 06/19/2023   Normocytic anemia 06/19/2023   Eschar of left great toe 05/22/2023   Near syncope 06/15/2021   Dizziness 06/15/2021   Lumbar stenosis with neurogenic claudication 11/09/2020   CKD stage 3 due to type 2 diabetes mellitus (HCC) 12/14/2019   Asymptomatic bilateral carotid artery stenosis 12/14/2019   Abdominal distention    CAD s/p CABG in 2009 12/14/2018   Acute renal failure superimposed on stage 3 chronic kidney disease (HCC) 12/14/2018   UTI (urinary tract infection) 12/14/2018   HLD (hyperlipidemia) 12/14/2018   Sepsis (HCC) 12/14/2018   Abnormal LFTs    S/P CABG (coronary artery bypass graft) 12/06/2018   Asymptomatic stenosis of right carotid artery 12/06/2018   Claudication in peripheral vascular disease (HCC) 12/06/2018   Orthostatic hypotension due to diabetic dysautonomia 12/06/2018   Elevated liver enzymes 12/06/2018   Abdominal pain 10/12/2018   Abnormal liver function tests 10/12/2018   Foreign body in stomach 10/12/2018   Primary hypertension    GERD (gastroesophageal reflux disease)    Diabetes mellitus without complication (HCC)    Atherosclerosis of native coronary artery of native heart without angina pectoris     PCP: Eloisa Northern, MD   REFERRING PROVIDER: Nadara Mustard, MD  ONSET DATE: 09/14/2023 Prosthesis delivery  REFERRING DIAG: Y86.578 (ICD-10-CM) - Left below-knee amputee   THERAPY DIAG:  Other abnormalities of gait and mobility  Unsteadiness on feet  Dizziness  and giddiness  Muscle weakness (generalized)  Impaired functional mobility, balance, gait, and endurance  Rationale for Evaluation and Treatment: Rehabilitation  SUBJECTIVE:   SUBJECTIVE STATEMENT: He  went to his grandson's house and had to walk on grass & 1 step.  He did it with cane without issues.  He is walking in house without cane ~50% with no issues.    PERTINENT HISTORY: Left TTA 06/24/23, DM2, retinopathy, GERD, HLD, HTN, CAD, CABG 2009, PAD, prostate CA, CKD stage 3b, arthritis, Laminectomy & Foraminotomy bilateral L4-5,   PAIN:  Are you having pain? No, 0/10 pain.  PRECAUTIONS: Fall  WEIGHT BEARING RESTRICTIONS: No  FALLS: Has patient fallen in last 6 months? Yes. Number of falls 1 no injuries  LIVING ENVIRONMENT: Lives with: lives with their family Lives in: House Home Access: Ramped entrance and single step Home layout: Two level, 1/2 bath on main level, and bedroom Stairs: Yes: Internal: 14 steps;  on left going up and can reach both and External: 1+1 steps; none Has following equipment at home: Quad cane small base, Environmental consultant - 2 wheeled, Wheelchair (manual), shower chair, Shower bench, and bed side commode  OCCUPATION: retired from [employer]  PLOF: Independent used cane & walker   PATIENT GOALS: to use prosthesis to walk in home & community, family wants to be safe,  go upstairs to shower  OBJECTIVE:   POSTURE: Evaluation on 09/16/2023:  rounded shoulders, forward head, flexed trunk , and weight shift right  LOWER EXTREMITY ROM:  ROM P:passive  A:active Right 09/24/23 Left 09/24/23  Hip flexion    Hip extension Maisie Fus -5* Thomas -11*  Hip abduction    Hip adduction    Hip internal rotation    Hip external rotation    Knee flexion    Knee extension  Supine P: -7*  Ankle dorsiflexion    Ankle plantarflexion    Ankle inversion    Ankle eversion     (Blank rows = not tested)  LOWER EXTREMITY MMT:  MMT Left 09/24/23  Hip flexion   Hip  extension Sidelying 3-/5  Hip abduction Sidleying 3/5  Hip adduction   Hip internal rotation   Hip external rotation   Knee flexion   Knee extension 3-/5  Ankle dorsiflexion   Ankle plantarflexion   Ankle inversion   Ankle eversion   At Evaluation all strength testing is grossly seated and functionally standing / gait. (Blank rows = not tested)  TRANSFERS: Evaluation on 09/16/2023:  Sit to stand: SBA 20" w/c with armrest to RW limited use of LLE Stand to sit: SBA RW to 20" w/c with armrest limited use of LLE  FUNCTIONAL TESTs:  12/28/2203:  TUG: no device standard 18.28 sec;  with cane std 19.68 sec  12/08/2023: Berg Balance 39/56 TUG: no device standard 29.28 sec;  with cane std 25.68 sec and cognitive 39.75 sec naming states (stopped naming during turns & repeated 2 states) 4-square step test with cane:  30.68   cane & TTA prosthesis, modA for backward step & minA all other directions  32.62   cane & TTA prosthesis, modA for backward step & minA all other directions    11/09/2023 Berg: 35/56   TUG trial 1: 38sec 2 stumbles with pt able to correct without therapist intervention, trial 2: 34sec no stumbles, MinA to CGA  09/24/2023:  Patient able to stand up from 20" w/c with armrest with RW for stabilization with supervision.  Patient able to maintain upright with RW support without assistance.  Patient able to balance without upper extremity support with minA for 60 seconds.  Deferred at evaluation on 09/16/2023 as limb too edematous to don prosthesis  GAIT: 12/28/2023:   Gait velocity:  with TTA prosthesis & cane stand alone tip comfortable /self-selected 2.01 ft/sec with SBA and fast pace 2.51 ft/sec with SBA Pt amb 150' with cane scanning environment was able to maintain path with slight decrease in speed.  Previously he either weaved from path or completely stopped to look.    12/10/2023: 6 Minute Walk Test: with cane 740' with supervision.    Resting HR 73 SpO2 98%   after gait HR 119  SpO2 99%  Modified Borg scale for dyspnea: 3: moderate shortness of breath or breathing difficulty -pt neg ramp & curb with cane with minA for balance.  He neg ramp & curb with RW safely.  -pt neg 11 steps with single rail & cane with  supervision with step-to pattern.   12/08/2023: Functional Gait Assessment with cane: 5/30 indicating high fall risk.  Gait velocity:  with TTA prosthesis & cane stand alone tip comfortable /self-selected 1.66 ft/sec with SBA and fast pace 2.26 ft/sec with minA.   11/17/2023: Ambulated with stand alone cane CGA with bouts of SBA, step through pattern with inconsistent gait cadence. Was also able to walk short distances without AD and CGA with vc to slow gait down for less instances of tripping.  11/09/2023:  Self selected gait speed: 1.46 ft/sec with stand alone cane, MinA  09/24/2023:   Patient ambulated 50 feet with RW with CGA.  Initially patient walking with a step to pattern with minimal weight shift onto prosthesis.   Deferred at evaluation on 09/16/2023 as limb too edematous to don prosthesis Gait pattern:  Distance walked:  Assistive device utilized:  Level of assistance:  Comments:   PROSTHETIC WEAR ASSESSMENT: 12/08/2023: Pt tolerates wear >90% of awake hours with no skin issues (larger wound that he initially had on tibial crest is completely healed) and reports no residual limb pain.  Pt & dtr verbalize proper prosthetic care with skin check, residual limb care, care of non-amputated limb, prosthetic cleaning, ply sock cleaning, correct ply sock adjustment, proper wear schedule/adjustment, and proper weight-bearing schedule/adjustment.  He requires cues to problem solving new prosthetic issues.    11/11/2023: Patient is wearing prosthesis all awake hours and checking / drying or switching Vivewear under liner every 5 hours.  Wound has one superficial scab 2mm & 3mm at distal portion. Remainder is healed.     Evaluation on  09/16/2023:  Patient is dependent with: skin check, residual limb care, care of non-amputated limb, prosthetic cleaning, ply sock cleaning, correct ply sock adjustment, proper wear schedule/adjustment, and proper weight-bearing schedule/adjustment Donning prosthesis: Max A Doffing prosthesis: Min A Prosthetic wear tolerance: 30-45 min 2 days ago when prosthesis delivered.  Prosthetic weight bearing tolerance: 3 minutes partial weight bearing trying to engage pin suspension.  Pt reports distal lateral limb pain from wear 2 days ago.  Edema: pitting edema  Residual limb condition: scab dry over tibial crest 5cm wide X 15 cm long.  Dry skin, normal color & temperature. Cylindrical shape Prosthetic description: silicon liner with anterior portion 9mm thick, secondary pelite foam liner, total contact socket,     TODAY'S TREATMENT:                                                                                                                             DATE:  01/04/2024: Prosthetic Training with left Transtibial Amputation: Pt amb 100' X 2 without device with supervision.   Neuromuscular Re-education: Balance activities to facilitate ankle / residual limb & hip strategies: standing crossways on foam beam   head motions right /mirror (visual reference for upright)/left, up/mirror/down and both diagonals.  Pt required minA for stability.  Static balance with eyes closed 10 sec 3 reps with minA Anticipatory  Step strategy stepping off foam beam & stabilizing without touching //bars.  Forward, backward and side step with ea LE 5 reps.  Pt required touch on //bars and PT assist but improved by 5th rep.   Braiding with BUE on //bar 3 laps.  Rocker board controlled motions ant/post & right/left 1 min ea.  Required BUE support on //bars.     TREATMENT:                                                                                                                             DATE:  12/28/2023: Prosthetic  Training with left Transtibial Amputation: TUG: no device standard 18.28 sec (on 12/08/23 was 29.28 sec);  with cane std 19.68 sec (on 12/08/23 was 25.68 sec) Gait velocity:  with TTA prosthesis & cane stand alone tip comfortable /self-selected 2.01 ft/sec with SBA and fast pace 2.51 ft/sec with SBA.  (On 12/08/2023 was 1.66 ft/sec with SBA and fast pace 2.26 ft/sec with minA.) PT reviewed significance of improvements above and pt / dtr verbalized understanding.  Pt amb 150' with cane scanning environment was able to maintain path with slight decrease in speed.  Previously he either weaved from path or completely stopped to look.   Stairs to increase functional strength of LLE.  2 rails descending & ascending with step-to pattern alternating lead LE.  PT demo & verbal cues on modifications with prosthesis for knee control.  Side stepping with 2 hands on one rail (did with both right & left rail).  Pt & dtr verbalized understanding & rationale as HEP.  PT recommended placing 3 chairs in backyard: 1 chair where he will sit for wedding, 1 half way & 1 75% of way.  Try to walk with cane & family to target chair but if he fatigues sit in one of the other chairs.  PT reviewed how to provide HHA as needed. Pt & dtr verbalized understanding.     TREATMENT:                                                                                                                             DATE:  12/21/2023: Prosthetic Training with left Transtibial Amputation: Resistive gait with blue theraband walking with cane outward against resistance and controlled return;  resistance anterior, posterior, right & left with SBA.  Pt verbalizes how to do as HEP.  Pt questioning if he needs to  continue to exercise after finishes PT.  PT reviewed need for ongoing exercises to address flexibility, strength, balance and muscle endurance with target 2-3 days /wk.  Pt verbalized understanding.  Stepping around obstacles 8' apart in square with  forward, sidestepping to right, backward and side stepping to left. CW & CCW with cane with CGA and without device with minA especially backwards.   Weaving around 4 obstacles 8' apart 4 reps with cane with supervision and 4 reps without device with CGA. Pt able to self correct one balance loss.   Stepping over 6" hurdle 4 reps with cane with minA. PT demo & verbal cues on sequencing / technique.  Pt neg 12* ramp with cane with minA for balance loss.  Pt neg 6.5" curb with cane with CGA.  PT verbal & tactile cues on technique.      PATIENT EDUCATION: PATIENT EDUCATED ON FOLLOWING PROSTHETIC CARE: Education details:   Skin check, Residual limb care, Prosthetic cleaning, Correct ply sock adjustment, Propper donning, and Proper wear schedule/adjustment Prosthetic wear tolerance: liner only initially 2 hours 3x/day, 7 days/week Person educated: Patient and Child(ren) Education method: Explanation, Demonstration, Tactile cues, and Verbal cues Education comprehension: dtr & granddaughter verbalized understanding, verbal cues required, tactile cues required, and needs further education  HOME EXERCISE PROGRAM:  ASSESSMENT:  CLINICAL IMPRESSION: PT began balance activities to facilitate ankle/residual limb, hip & step strategies.  He improved but needs further PT assistance prior to trying at home.    Patient will continue to benefit from continued skilled physical therapy to address deficits.    OBJECTIVE IMPAIRMENTS: Abnormal gait, decreased activity tolerance, decreased balance, decreased endurance, decreased knowledge of condition, decreased knowledge of use of DME, decreased mobility, difficulty walking, decreased ROM, decreased strength, increased edema, prosthetic dependency , and pain.   ACTIVITY LIMITATIONS: carrying, lifting, sitting, standing, stairs, transfers, and locomotion level  PARTICIPATION LIMITATIONS: standing ADLS, cleaning, community activity, and household activity  PERSONAL  FACTORS: Age, Fitness, Past/current experiences, Time since onset of injury/illness/exacerbation, and 3+ comorbidities: see PMH  are also affecting patient's functional outcome.   REHAB POTENTIAL: Good  CLINICAL DECISION MAKING: Evolving/moderate complexity  EVALUATION COMPLEXITY: Moderate   GOALS: Goals reviewed with patient? Yes  SHORT TERM GOALS: Target date: 01/11/2024  Patient verbalizes understanding of updated HEP. Baseline: SEE OBJECTIVE DATA Goal status: MET  12/28/2023 2.  Timed Up & Go with cane <22 sec Baseline: SEE OBJECTIVE DATA Goal status: MET  12/28/2023  3.  Patient ambulates 150' with cane scanning environment maintaining path.  Baseline: SEE OBJECTIVE DATA Goal status: MET    12/28/2023    UPDATED LONG TERM GOALS: Target date: 03/09/2024  Patient & family demonstrates & verbalizes ongoing understanding of prosthetic care including problem solving issues & tolerates wear >90% of awake hours without skin or limb pain issues to enable safe utilization of prosthesis. Baseline: SEE OBJECTIVE DATA Goal status: Ongoing    01/04/2024  Timed Up & Go without Assistive device <30 sec safely. Baseline: SEE OBJECTIVE DATA Goal status: Ongoing    01/04/2024  Berg Balance >/= 45/56 to indicate lower fall risk with standing ADLs.  Baseline: SEE OBJECTIVE DATA Goal status:  Ongoing    01/04/2024  4.  Patient ambulates 15' with prosthesis only carrying plate & cup modified independent. Baseline: SEE OBJECTIVE DATA Goal status: Ongoing    12/28/2023  5. 6 Minute Walk Test >900' modified independent with prosthesis & cane. Baseline: SEE OBJECTIVE DATA Goal status:  Ongoing  01/04/2024  6.  Patient negotiates ramps, curbs & stairs with single rail with prosthesis & cane modified independent.  Baseline: SEE OBJECTIVE DATA Goal status:  Ongoing   01/04/2024  7.  Patient demonstrates improved balance with 4-Square Step Test with cane <25 sec safely  Baseline: SEE OBJECTIVE  DATA Goal status:  Ongoing   01/04/2024 PLAN:  PT FREQUENCY: 1-2x/wk  PT DURATION: 90 days  PLANNED INTERVENTIONS: 97164- PT Re-evaluation, 97110-Therapeutic exercises, 97530- Therapeutic activity, 97112- Neuromuscular re-education, 97535- Self Care, 04540- Gait training, [phone]- Prosthetic training, Patient/Family education, Balance training, Stair training, DME instructions, and physical performance testing  PLAN FOR NEXT SESSION:    continue balance activities. Continue to progress HEP & home activities.   Vladimir Faster, PT, DPT 01/04/2024, 3:07 PM   Date of referral: 08/31/2023 Referring provider: Aldean Baker, MD Referring diagnosis? J47.829 (ICD-10-CM) - Left below-knee amputee  Treatment diagnosis? (if different than referring diagnosis)  Other abnormalities of gait and mobility  ICD-10-CM: R26.89   Unsteadiness on feet  ICD-10-CM: R26.81   Muscle weakness (generalized)  ICD-10-CM: M62.81   Impaired functional mobility, balance, gait, and endurance  ICD-10-CM: Z74.09   What was this (referring dx) caused by? Surgery (Type: left BKA)  Nature of Condition: Initial Onset (within last 3 months) prosthesis delivery    Laterality: Lt  Current Functional Measure Score: 12/10/2023 Berg Balance 39/56;  11/09/2023 Berg Balance 35/56;  at evalation was standing balance supervision static stance 1 min.  TUG: no device standard 18.28 sec (on 12/08/23 was 29.28 sec);  with cane std 19.68 sec (on 12/08/23 was 25.68 sec) Gait velocity:  with TTA prosthesis & cane stand alone tip comfortable /self-selected 2.01 ft/sec with SBA and fast pace 2.51 ft/sec with SBA.  (On 12/08/2023 was 1.66 ft/sec with SBA and fast pace 2.26 ft/sec with minA.  Objective measurements identify impairments when they are compared to normal values, the uninvolved extremity, and prior level of function.  [x]  Yes  []  No  Objective assessment of functional ability: Moderate functional limitations   Briefly describe  symptoms: Eval was patient is dependent in prosthetic care & use.  He has impaired mobility. Patient is deconditioned from prolonged limited activity with wound, then amputation. 11/11/2023: patient is wearing prosthesis most of awake hours and wound is almost completely healed. Pt has general understanding of prosthetic care.  He is ambulating in home with cane and community with RW.  Previously was w/c bound.  12/10/2023: patient is functioning with family with cane in community.  Functional Outcome Tests indicate high fall risk and he has potential to decrease risk with PT.   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 Constantly  How much have the symptoms interfered with usual daily activities? Extremely  How has condition changed since care began at this facility? Improved - Better  In general, how is the patients overall health? Good   BACK PAIN (STarT Back Screening Tool) No

## 2024-01-08 ENCOUNTER — Other Ambulatory Visit: Payer: Self-pay | Admitting: Cardiology

## 2024-01-08 DIAGNOSIS — N401 Enlarged prostate with lower urinary tract symptoms: Secondary | ICD-10-CM

## 2024-01-11 ENCOUNTER — Ambulatory Visit: Admitting: Physical Therapy

## 2024-01-11 ENCOUNTER — Encounter: Payer: Self-pay | Admitting: Physical Therapy

## 2024-01-11 DIAGNOSIS — R42 Dizziness and giddiness: Secondary | ICD-10-CM | POA: Diagnosis not present

## 2024-01-11 DIAGNOSIS — R2681 Unsteadiness on feet: Secondary | ICD-10-CM | POA: Diagnosis not present

## 2024-01-11 DIAGNOSIS — R2689 Other abnormalities of gait and mobility: Secondary | ICD-10-CM

## 2024-01-11 DIAGNOSIS — Z7409 Other reduced mobility: Secondary | ICD-10-CM

## 2024-01-11 DIAGNOSIS — M6281 Muscle weakness (generalized): Secondary | ICD-10-CM | POA: Diagnosis not present

## 2024-01-11 NOTE — Therapy (Signed)
 OUTPATIENT PHYSICAL THERAPY PROSTHETIC TREATMENT   Patient Name: Justin [name] MRN: [id number] DOB:[birth year], 89 y.o., male Today's Date: 01/11/2024  END OF SESSION:  PT End of Session - 01/11/24 1046     Visit Number 21    Number of Visits 35    Date for PT Re-Evaluation 03/09/24    Authorization Type [employer] Medicare    Authorization Time Period $20 COPAY Visits 4  12/28/2023 - 01/25/2024    Authorization - Visit Number 2    Authorization - Number of Visits 4    Progress Note Due on Visit 26    PT Start Time 1046    PT Stop Time 1144    PT Time Calculation (min) 58 min    Equipment Utilized During Treatment Gait belt    Activity Tolerance Patient tolerated treatment well    Behavior During Therapy WFL for tasks assessed/performed                          $20 COPAY 16 PT VISITS APPROVED 12/18-2/12/25   Past Medical History:  Diagnosis Date   Anxiety    Arthritis    Carotid arterial disease (HCC)    CKD (chronic kidney disease)    Coronary artery disease    Diabetes mellitus without complication (HCC)    Diabetic retinopathy (HCC)    NPDR OU   Diverticulitis    Dyspnea    GERD (gastroesophageal reflux disease)    History of kidney stones 2021   Hypercholesteremia    Hypertension    Hypertensive retinopathy    OU   Myocardial infarction [facility]) 2009   Pneumonia    as a child   Prostate cancer (HCC)    UTI (lower urinary tract infection)    Past Surgical History:  Procedure Laterality Date   ABDOMINAL AORTOGRAM W/LOWER EXTREMITY N/A 06/22/2023   Procedure: ABDOMINAL AORTOGRAM W/LOWER EXTREMITY;  Surgeon: Maeola Harman, MD;  Location: [facility] INVASIVE CV LAB;  Service: Cardiovascular;  Laterality: N/A;   ABDOMINAL SURGERY     for diverticulitis; also removed appendix   AMPUTATION Left 06/24/2023   Procedure: LEFT BELOW THE KNEE AMPUTATION;  Surgeon: Nadara Mustard, MD;  Location: [facility] OR;  Service: Orthopedics;  Laterality: Left;    APPENDECTOMY     CARDIAC CATHETERIZATION  2018   CATARACT EXTRACTION     CATARACT EXTRACTION, BILATERAL     CHOLECYSTECTOMY N/A 10/12/2017   Procedure: LAPAROSCOPIC CHOLECYSTECTOMY;  Surgeon: Abigail Miyamoto, MD;  Location: [facility] OR;  Service: General;  Laterality: N/A;   CORONARY ARTERY BYPASS GRAFT  2009   ERCP N/A 12/21/2018   Procedure: ENDOSCOPIC RETROGRADE CHOLANGIOPANCREATOGRAPHY (ERCP);  Surgeon: Jeani Hawking, MD;  Location: [facility] ENDOSCOPY;  Service: Endoscopy;  Laterality: N/A;   ESOPHAGOGASTRODUODENOSCOPY (EGD) WITH PROPOFOL N/A 12/17/2018   Procedure: ESOPHAGOGASTRODUODENOSCOPY (EGD) WITH PROPOFOL;  Surgeon: Jeani Hawking, MD;  Location: [facility] ENDOSCOPY;  Service: Endoscopy;  Laterality: N/A;   EUS Left 12/17/2018   Procedure: UPPER ENDOSCOPIC ULTRASOUND (EUS) LINEAR;  Surgeon: Jeani Hawking, MD;  Location: [facility] ENDOSCOPY;  Service: Endoscopy;  Laterality: Left;   EYE SURGERY     "for bleeding in eye"   HERNIA REPAIR     LEFT HEART CATH AND CORONARY ANGIOGRAPHY N/A 11/04/2016   Procedure: Left Heart Cath and Coronary Angiography;  Surgeon: Yates Decamp, MD;  Location: [facility] INVASIVE CV LAB;  Service: Cardiovascular;  Laterality: N/A;   LOWER EXTREMITY ANGIOGRAPHY N/A 11/04/2016   Procedure: Lower Extremity  Angiography;  Surgeon: Yates Decamp, MD;  Location: [facility] INVASIVE CV LAB;  Service: Cardiovascular;  Laterality: N/A;   LOWER EXTREMITY ANGIOGRAPHY N/A 01/03/2020   Procedure: LOWER EXTREMITY ANGIOGRAPHY;  Surgeon: Yates Decamp, MD;  Location: [facility] INVASIVE CV LAB;  Service: Cardiovascular;  Laterality: N/A;   LOWER EXTREMITY ANGIOGRAPHY Bilateral 01/14/2022   Procedure: Lower Extremity Angiography;  Surgeon: Yates Decamp, MD;  Location: [facility] INVASIVE CV LAB;  Service: Cardiovascular;  Laterality: Bilateral;   LUMBAR LAMINECTOMY/DECOMPRESSION MICRODISCECTOMY Bilateral 11/09/2020   Procedure: Laminectomy and Foraminotomy - bilateral - Lumbar Four-Five.;  Surgeon: Julio Sicks, MD;  Location: [facility] OR;  Service:  Neurosurgery;  Laterality: Bilateral;  posterior   PROSTATE SURGERY     REMOVAL OF STONES  12/21/2018   Procedure: REMOVAL OF STONES;  Surgeon: Jeani Hawking, MD;  Location: [facility] ENDOSCOPY;  Service: Endoscopy;;   SPHINCTEROTOMY  12/21/2018   Procedure: Dennison Mascot;  Surgeon: Jeani Hawking, MD;  Location: [facility] ENDOSCOPY;  Service: Endoscopy;;   Patient Active Problem List   Diagnosis Date Noted   Osteomyelitis of great toe of left foot (HCC) 06/20/2023   Gangrene of toe of left foot (HCC) 06/19/2023   Normocytic anemia 06/19/2023   Eschar of left great toe 05/22/2023   Near syncope 06/15/2021   Dizziness 06/15/2021   Lumbar stenosis with neurogenic claudication 11/09/2020   CKD stage 3 due to type 2 diabetes mellitus (HCC) 12/14/2019   Asymptomatic bilateral carotid artery stenosis 12/14/2019   Abdominal distention    CAD s/p CABG in 2009 12/14/2018   Acute renal failure superimposed on stage 3 chronic kidney disease (HCC) 12/14/2018   UTI (urinary tract infection) 12/14/2018   HLD (hyperlipidemia) 12/14/2018   Sepsis (HCC) 12/14/2018   Abnormal LFTs    S/P CABG (coronary artery bypass graft) 12/06/2018   Asymptomatic stenosis of right carotid artery 12/06/2018   Claudication in peripheral vascular disease (HCC) 12/06/2018   Orthostatic hypotension due to diabetic dysautonomia 12/06/2018   Elevated liver enzymes 12/06/2018   Abdominal pain 10/12/2018   Abnormal liver function tests 10/12/2018   Foreign body in stomach 10/12/2018   Primary hypertension    GERD (gastroesophageal reflux disease)    Diabetes mellitus without complication (HCC)    Atherosclerosis of native coronary artery of native heart without angina pectoris     PCP: Eloisa Northern, MD   REFERRING PROVIDER: Nadara Mustard, MD  ONSET DATE: 09/14/2023 Prosthesis delivery  REFERRING DIAG: Z61.096 (ICD-10-CM) - Left below-knee amputee   THERAPY DIAG:  Other abnormalities of gait and mobility  Unsteadiness on  feet  Dizziness and giddiness  Muscle weakness (generalized)  Impaired functional mobility, balance, gait, and endurance  Rationale for Evaluation and Treatment: Rehabilitation  SUBJECTIVE:   SUBJECTIVE STATEMENT: He has been walking as much as possible and doing the stairs.   PERTINENT HISTORY: Left TTA 06/24/23, DM2, retinopathy, GERD, HLD, HTN, CAD, CABG 2009, PAD, prostate CA, CKD stage 3b, arthritis, Laminectomy & Foraminotomy bilateral L4-5,   PAIN:  Are you having pain? No, 0/10 pain.  PRECAUTIONS: Fall  WEIGHT BEARING RESTRICTIONS: No  FALLS: Has patient fallen in last 6 months? Yes. Number of falls 1 no injuries  LIVING ENVIRONMENT: Lives with: lives with their family Lives in: House Home Access: Ramped entrance and single step Home layout: Two level, 1/2 bath on main level, and bedroom Stairs: Yes: Internal: 14 steps; on left going up and can reach both and External: 1+1 steps; none Has following equipment at home: Quad cane small base, Environmental consultant -  2 wheeled, Wheelchair (manual), shower chair, Shower bench, and bed side commode  OCCUPATION: retired from [employer]  PLOF: Independent used cane & walker   PATIENT GOALS: to use prosthesis to walk in home & community, family wants to be safe,  go upstairs to shower  OBJECTIVE:   POSTURE: Evaluation on 09/16/2023:  rounded shoulders, forward head, flexed trunk , and weight shift right  LOWER EXTREMITY ROM:  ROM P:passive  A:active Right 09/24/23 Left 09/24/23  Hip flexion    Hip extension Maisie Fus -5* Thomas -11*  Hip abduction    Hip adduction    Hip internal rotation    Hip external rotation    Knee flexion    Knee extension  Supine P: -7*  Ankle dorsiflexion    Ankle plantarflexion    Ankle inversion    Ankle eversion     (Blank rows = not tested)  LOWER EXTREMITY MMT:  MMT Left 09/24/23  Hip flexion   Hip extension Sidelying 3-/5  Hip abduction Sidleying 3/5  Hip adduction   Hip  internal rotation   Hip external rotation   Knee flexion   Knee extension 3-/5  Ankle dorsiflexion   Ankle plantarflexion   Ankle inversion   Ankle eversion   At Evaluation all strength testing is grossly seated and functionally standing / gait. (Blank rows = not tested)  TRANSFERS: Evaluation on 09/16/2023:  Sit to stand: SBA 20" w/c with armrest to RW limited use of LLE Stand to sit: SBA RW to 20" w/c with armrest limited use of LLE  FUNCTIONAL TESTs:  12/28/2203:  TUG: no device standard 18.28 sec;  with cane std 19.68 sec  12/08/2023: Berg Balance 39/56 TUG: no device standard 29.28 sec;  with cane std 25.68 sec and cognitive 39.75 sec naming states (stopped naming during turns & repeated 2 states) 4-square step test with cane:  30.68   cane & TTA prosthesis, modA for backward step & minA all other directions  32.62   cane & TTA prosthesis, modA for backward step & minA all other directions    11/09/2023 Berg: 35/56   TUG trial 1: 38sec 2 stumbles with pt able to correct without therapist intervention, trial 2: 34sec no stumbles, MinA to CGA  09/24/2023:  Patient able to stand up from 20" w/c with armrest with RW for stabilization with supervision.  Patient able to maintain upright with RW support without assistance.  Patient able to balance without upper extremity support with minA for 60 seconds.  Deferred at evaluation on 09/16/2023 as limb too edematous to don prosthesis  GAIT: 12/28/2023:   Gait velocity:  with TTA prosthesis & cane stand alone tip comfortable /self-selected 2.01 ft/sec with SBA and fast pace 2.51 ft/sec with SBA Pt amb 150' with cane scanning environment was able to maintain path with slight decrease in speed.  Previously he either weaved from path or completely stopped to look.    12/10/2023: 6 Minute Walk Test: with cane 740' with supervision.    Resting HR 73 SpO2 98%  after gait HR 119  SpO2 99%  Modified Borg scale for dyspnea: 3: moderate  shortness of breath or breathing difficulty -pt neg ramp & curb with cane with minA for balance.  He neg ramp & curb with RW safely.  -pt neg 11 steps with single rail & cane with supervision with step-to pattern.   12/08/2023: Functional Gait Assessment with cane: 5/30 indicating high fall risk.  Gait velocity:  with TTA prosthesis &  cane stand alone tip comfortable /self-selected 1.66 ft/sec with SBA and fast pace 2.26 ft/sec with minA.   11/17/2023: Ambulated with stand alone cane CGA with bouts of SBA, step through pattern with inconsistent gait cadence. Was also able to walk short distances without AD and CGA with vc to slow gait down for less instances of tripping.  11/09/2023:  Self selected gait speed: 1.46 ft/sec with stand alone cane, MinA  09/24/2023:   Patient ambulated 50 feet with RW with CGA.  Initially patient walking with a step to pattern with minimal weight shift onto prosthesis.   Deferred at evaluation on 09/16/2023 as limb too edematous to don prosthesis Gait pattern:  Distance walked:  Assistive device utilized:  Level of assistance:  Comments:   PROSTHETIC WEAR ASSESSMENT: 12/08/2023: Pt tolerates wear >90% of awake hours with no skin issues (larger wound that he initially had on tibial crest is completely healed) and reports no residual limb pain.  Pt & dtr verbalize proper prosthetic care with skin check, residual limb care, care of non-amputated limb, prosthetic cleaning, ply sock cleaning, correct ply sock adjustment, proper wear schedule/adjustment, and proper weight-bearing schedule/adjustment.  He requires cues to problem solving new prosthetic issues.    11/11/2023: Patient is wearing prosthesis all awake hours and checking / drying or switching Vivewear under liner every 5 hours.  Wound has one superficial scab 2mm & 3mm at distal portion. Remainder is healed.     Evaluation on 09/16/2023:  Patient is dependent with: skin check, residual limb care, care  of non-amputated limb, prosthetic cleaning, ply sock cleaning, correct ply sock adjustment, proper wear schedule/adjustment, and proper weight-bearing schedule/adjustment Donning prosthesis: Max A Doffing prosthesis: Min A Prosthetic wear tolerance: 30-45 min 2 days ago when prosthesis delivered.  Prosthetic weight bearing tolerance: 3 minutes partial weight bearing trying to engage pin suspension.  Pt reports distal lateral limb pain from wear 2 days ago.  Edema: pitting edema  Residual limb condition: scab dry over tibial crest 5cm wide X 15 cm long.  Dry skin, normal color & temperature. Cylindrical shape Prosthetic description: silicon liner with anterior portion 9mm thick, secondary pelite foam liner, total contact socket,     TODAY'S TREATMENT:                                                                                                                             DATE:  01/11/2024: Therapeutic Exercises & Therapeutic Activities PT broke HEP into groups by location and need for supervision.  PT recommending to try to perform each group 2-3x/wk to enable variety of exercises that he needs without being overwhelmed. PT instructed with demo, verbal & HO cues.  Pt & dtr verbalized understanding after completing in PT.  Group 1 - resistance band - walking balance with band behind you  - 1 x daily - 3 x weekly - 1 sets - 10 reps - 5 seconds hold - walking balance with band in  front of you  - 1 x daily - 3 x weekly - 1 sets - 10 reps - 5 seconds hold - walking balance with band to your right  - 1 x daily - 3 x weekly - 1 sets - 10 reps - 5 seconds hold - walking balance with band to your left  - 1 x daily - 3 x weekly - 1 sets - 10 reps - 5 seconds hold  Group 2 - near counter - 4 square stepping with back side to counter  - 1 x daily - 3 x weekly - 1 sets - 3 reps - Backwards Walking  - 1 x daily - 3 x weekly - 1 sets - 10 reps - Sideways Walking  - 1 x daily - 3 x weekly - 1 sets - 10  reps - Carioca with Counter Support  - 1 x daily - 3 x weekly - 1 sets - 10 reps  Group 3 - walking with family supervision (with cane & without device) - Walk with Head Turns  - 1 x daily - 3 x weekly - 1 sets - 10 reps - Walking with Eyes Closed  - 1 x daily - 3 x weekly - 5 sets - 3 reps - Fast Walking  - 1 x daily - 3 x weekly - 5 sets - 3 reps - stair climbing using alternating pattern with 2 rails  - 1 x daily - 3 x weekly - 1 sets - 10 reps  Group 4 - stretches - Seated Hamstring Stretch with Strap  - 1 x daily - 3-5 x weekly - 1 sets - 3 reps - 20-30 seconds hold - Standing Gastroc Stretch on Step  - 1 x daily - 3-5 x weekly - 1 sets - 3 reps - 20-30 seconds hold - Upright Stance at Door Frame Single Arm (can use cane to assist UE motion) - 1-3 x daily - 7 x weekly - 1 sets - 2 reps - 2 deep breathes hold - Upright Stance at Door Frame with Both Arms (can use cane to assist UE motion)   - 1-3 x daily - 7 x weekly - 1 sets - 2 reps - 2 deep breathes hold     TREATMENT:                                                                                                                             DATE:  01/04/2024: Prosthetic Training with left Transtibial Amputation: Pt amb 100' X 2 without device with supervision.   Neuromuscular Re-education: Balance activities to facilitate ankle / residual limb & hip strategies: standing crossways on foam beam   head motions right /mirror (visual reference for upright)/left, up/mirror/down and both diagonals.  Pt required minA for stability.  Static balance with eyes closed 10 sec 3 reps with minA Anticipatory Step strategy stepping off foam beam & stabilizing without touching //bars.  Forward, backward and side  step with ea LE 5 reps.  Pt required touch on //bars and PT assist but improved by 5th rep.   Braiding with BUE on //bar 3 laps.  Rocker board controlled motions ant/post & right/left 1 min ea.  Required BUE support on //bars.      TREATMENT:                                                                                                                             DATE:  12/28/2023: Prosthetic Training with left Transtibial Amputation: TUG: no device standard 18.28 sec (on 12/08/23 was 29.28 sec);  with cane std 19.68 sec (on 12/08/23 was 25.68 sec) Gait velocity:  with TTA prosthesis & cane stand alone tip comfortable /self-selected 2.01 ft/sec with SBA and fast pace 2.51 ft/sec with SBA.  (On 12/08/2023 was 1.66 ft/sec with SBA and fast pace 2.26 ft/sec with minA.) PT reviewed significance of improvements above and pt / dtr verbalized understanding.  Pt amb 150' with cane scanning environment was able to maintain path with slight decrease in speed.  Previously he either weaved from path or completely stopped to look.   Stairs to increase functional strength of LLE.  2 rails descending & ascending with step-to pattern alternating lead LE.  PT demo & verbal cues on modifications with prosthesis for knee control.  Side stepping with 2 hands on one rail (did with both right & left rail).  Pt & dtr verbalized understanding & rationale as HEP.  PT recommended placing 3 chairs in backyard: 1 chair where he will sit for wedding, 1 half way & 1 75% of way.  Try to walk with cane & family to target chair but if he fatigues sit in one of the other chairs.  PT reviewed how to provide HHA as needed. Pt & dtr verbalized understanding.     PATIENT EDUCATION: PATIENT EDUCATED ON FOLLOWING PROSTHETIC CARE: Education details:   Skin check, Residual limb care, Prosthetic cleaning, Correct ply sock adjustment, Propper donning, and Proper wear schedule/adjustment Prosthetic wear tolerance: liner only initially 2 hours 3x/day, 7 days/week Person educated: Patient and Child(ren) Education method: Explanation, Demonstration, Tactile cues, and Verbal cues Education comprehension: dtr & granddaughter verbalized understanding, verbal cues  required, tactile cues required, and needs further education  HOME EXERCISE PROGRAM: Access Code: [email or web address] URL: [email or web address] Date: 01/11/2024 Prepared by: Vladimir Faster  Exercises - walking balance with band behind you  - 1 x daily - 3 x weekly - 1 sets - 10 reps - 5 seconds hold - walking balance with band in front of you  - 1 x daily - 3 x weekly - 1 sets - 10 reps - 5 seconds hold - walking balance with band to your right  - 1 x daily - 3 x weekly - 1 sets - 10 reps - 5 seconds hold - walking balance with band to your left  - 1 x daily - 3 x weekly -  1 sets - 10 reps - 5 seconds hold - 4 square stepping with back side to counter  - 1 x daily - 3 x weekly - 1 sets - 3 reps - Walk with Head Turns  - 1 x daily - 3 x weekly - 1 sets - 10 reps - Backwards Walking  - 1 x daily - 3 x weekly - 1 sets - 10 reps - Sideways Walking  - 1 x daily - 3 x weekly - 1 sets - 10 reps - Carioca with Counter Support  - 1 x daily - 3 x weekly - 1 sets - 10 reps - Walking with Eyes Closed  - 1 x daily - 3 x weekly - 5 sets - 3 reps - Fast Walking  - 1 x daily - 3 x weekly - 5 sets - 3 reps - stair climbing using alternating pattern with 2 rails  - 1 x daily - 3 x weekly - 1 sets - 10 reps - Seated Hamstring Stretch with Strap  - 1 x daily - 3-5 x weekly - 1 sets - 3 reps - 20-30 seconds hold - Standing Gastroc Stretch on Step  - 1 x daily - 3-5 x weekly - 1 sets - 3 reps - 20-30 seconds hold - Upright Stance at Door Frame Single Arm  - 1-3 x daily - 7 x weekly - 1 sets - 2 reps - 2 deep breathes hold - Upright Stance at Door Frame with Both Arms  - 1-3 x daily - 7 x weekly - 1 sets - 2 reps - 2 deep breathes hold   ASSESSMENT:  CLINICAL IMPRESSION: Pt and dtr appear to understand updated HEP. Dividing the HEP into groups enables greater variety without feeling overwhelmed.    Patient will continue to benefit from continued skilled physical therapy to address deficits.    OBJECTIVE  IMPAIRMENTS: Abnormal gait, decreased activity tolerance, decreased balance, decreased endurance, decreased knowledge of condition, decreased knowledge of use of DME, decreased mobility, difficulty walking, decreased ROM, decreased strength, increased edema, prosthetic dependency , and pain.   ACTIVITY LIMITATIONS: carrying, lifting, sitting, standing, stairs, transfers, and locomotion level  PARTICIPATION LIMITATIONS: standing ADLS, cleaning, community activity, and household activity  PERSONAL FACTORS: Age, Fitness, Past/current experiences, Time since onset of injury/illness/exacerbation, and 3+ comorbidities: see PMH  are also affecting patient's functional outcome.   REHAB POTENTIAL: Good  CLINICAL DECISION MAKING: Evolving/moderate complexity  EVALUATION COMPLEXITY: Moderate   GOALS: Goals reviewed with patient? Yes  SHORT TERM GOALS: Target date: 01/11/2024  Patient verbalizes understanding of updated HEP. Baseline: SEE OBJECTIVE DATA Goal status: MET  12/28/2023 2.  Timed Up & Go with cane <22 sec Baseline: SEE OBJECTIVE DATA Goal status: MET  12/28/2023  3.  Patient ambulates 150' with cane scanning environment maintaining path.  Baseline: SEE OBJECTIVE DATA Goal status: MET    12/28/2023    UPDATED LONG TERM GOALS: Target date: 03/09/2024  Patient & family demonstrates & verbalizes ongoing understanding of prosthetic care including problem solving issues & tolerates wear >90% of awake hours without skin or limb pain issues to enable safe utilization of prosthesis. Baseline: SEE OBJECTIVE DATA Goal status: Ongoing    01/11/2024  Timed Up & Go without Assistive device <30 sec safely. Baseline: SEE OBJECTIVE DATA Goal status: Ongoing    01/11/2024  Berg Balance >/= 45/56 to indicate lower fall risk with standing ADLs.  Baseline: SEE OBJECTIVE DATA Goal status:  Ongoing    01/11/2024  4.  Patient ambulates 33' with prosthesis only carrying plate & cup modified  independent. Baseline: SEE OBJECTIVE DATA Goal status: Ongoing   01/11/2024  5. 6 Minute Walk Test >900' modified independent with prosthesis & cane. Baseline: SEE OBJECTIVE DATA Goal status:  Ongoing    01/11/2024  6.  Patient negotiates ramps, curbs & stairs with single rail with prosthesis & cane modified independent.  Baseline: SEE OBJECTIVE DATA Goal status:  Ongoing  01/11/2024  7.  Patient demonstrates improved balance with 4-Square Step Test with cane <25 sec safely  Baseline: SEE OBJECTIVE DATA Goal status:  Ongoing   01/11/2024 PLAN:  PT FREQUENCY: 1-2x/wk  PT DURATION: 90 days  PLANNED INTERVENTIONS: 97164- PT Re-evaluation, 97110-Therapeutic exercises, 97530- Therapeutic activity, 97112- Neuromuscular re-education, 97535- Self Care, 16109- Gait training, [phone]- Prosthetic training, Patient/Family education, Balance training, Stair training, DME instructions, and physical performance testing  PLAN FOR NEXT SESSION:    check on HEP, continue balance activities. Continue to progress HEP & home activities.   Vladimir Faster, PT, DPT 01/11/2024, 12:52 PM   Date of referral: 08/31/2023 Referring provider: Aldean Baker, MD Referring diagnosis? U98.119 (ICD-10-CM) - Left below-knee amputee  Treatment diagnosis? (if different than referring diagnosis)  Other abnormalities of gait and mobility  ICD-10-CM: R26.89   Unsteadiness on feet  ICD-10-CM: R26.81   Muscle weakness (generalized)  ICD-10-CM: M62.81   Impaired functional mobility, balance, gait, and endurance  ICD-10-CM: Z74.09   What was this (referring dx) caused by? Surgery (Type: left BKA)  Nature of Condition: Initial Onset (within last 3 months) prosthesis delivery    Laterality: Lt  Current Functional Measure Score: 12/10/2023 Berg Balance 39/56;  11/09/2023 Berg Balance 35/56;  at evalation was standing balance supervision static stance 1 min.  TUG: no device standard 18.28 sec (on 12/08/23 was 29.28 sec);  with  cane std 19.68 sec (on 12/08/23 was 25.68 sec) Gait velocity:  with TTA prosthesis & cane stand alone tip comfortable /self-selected 2.01 ft/sec with SBA and fast pace 2.51 ft/sec with SBA.  (On 12/08/2023 was 1.66 ft/sec with SBA and fast pace 2.26 ft/sec with minA.  Objective measurements identify impairments when they are compared to normal values, the uninvolved extremity, and prior level of function.  [x]  Yes  []  No  Objective assessment of functional ability: Moderate functional limitations   Briefly describe symptoms: Eval was patient is dependent in prosthetic care & use.  He has impaired mobility. Patient is deconditioned from prolonged limited activity with wound, then amputation. 11/11/2023: patient is wearing prosthesis most of awake hours and wound is almost completely healed. Pt has general understanding of prosthetic care.  He is ambulating in home with cane and community with RW.  Previously was w/c bound.  12/10/2023: patient is functioning with family with cane in community.  Functional Outcome Tests indicate high fall risk and he has potential to decrease risk with PT.   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 Constantly  How much have the symptoms interfered with usual daily activities? Extremely  How has condition changed since care began at this facility? Improved - Better  In general, how is the patients overall health? Good   BACK PAIN (STarT Back Screening Tool) No

## 2024-01-18 ENCOUNTER — Ambulatory Visit: Admitting: Physical Therapy

## 2024-01-18 ENCOUNTER — Encounter: Payer: Self-pay | Admitting: Physical Therapy

## 2024-01-18 DIAGNOSIS — R2681 Unsteadiness on feet: Secondary | ICD-10-CM | POA: Diagnosis not present

## 2024-01-18 DIAGNOSIS — R2689 Other abnormalities of gait and mobility: Secondary | ICD-10-CM | POA: Diagnosis not present

## 2024-01-18 DIAGNOSIS — R42 Dizziness and giddiness: Secondary | ICD-10-CM | POA: Diagnosis not present

## 2024-01-18 DIAGNOSIS — M6281 Muscle weakness (generalized): Secondary | ICD-10-CM

## 2024-01-18 DIAGNOSIS — Z7409 Other reduced mobility: Secondary | ICD-10-CM

## 2024-01-18 NOTE — Therapy (Signed)
 OUTPATIENT PHYSICAL THERAPY PROSTHETIC TREATMENT   Patient Name: Justin [name] MRN: [id number] DOB:1933-01-18, 89 y.o., male Today's Date: 01/18/2024  END OF SESSION:  PT End of Session - 01/18/24 1103     Visit Number 22    Number of Visits 35    Date for PT Re-Evaluation 03/09/24    Authorization Type [employer] Medicare    Authorization Time Period $20 COPAY Visits 4  12/28/2023 - 01/25/2024    Authorization - Visit Number 3    Authorization - Number of Visits 4    Progress Note Due on Visit 26    PT Start Time 1100    PT Stop Time 1139    PT Time Calculation (min) 39 min    Equipment Utilized During Treatment Gait belt    Activity Tolerance Patient tolerated treatment well    Behavior During Therapy WFL for tasks assessed/performed                           $20 COPAY 16 PT VISITS APPROVED 12/18-2/12/25   Past Medical History:  Diagnosis Date   Anxiety    Arthritis    Carotid arterial disease (HCC)    CKD (chronic kidney disease)    Coronary artery disease    Diabetes mellitus without complication (HCC)    Diabetic retinopathy (HCC)    NPDR OU   Diverticulitis    Dyspnea    GERD (gastroesophageal reflux disease)    History of kidney stones 2021   Hypercholesteremia    Hypertension    Hypertensive retinopathy    OU   Myocardial infarction [facility]) 2009   Pneumonia    as a child   Prostate cancer (HCC)    UTI (lower urinary tract infection)    Past Surgical History:  Procedure Laterality Date   ABDOMINAL AORTOGRAM W/LOWER EXTREMITY N/A 06/22/2023   Procedure: ABDOMINAL AORTOGRAM W/LOWER EXTREMITY;  Surgeon: Adine Hoof, MD;  Location: [facility] INVASIVE CV LAB;  Service: Cardiovascular;  Laterality: N/A;   ABDOMINAL SURGERY     for diverticulitis; also removed appendix   AMPUTATION Left 06/24/2023   Procedure: LEFT BELOW THE KNEE AMPUTATION;  Surgeon: Timothy Ford, MD;  Location: [facility] OR;  Service: Orthopedics;  Laterality: Left;    APPENDECTOMY     CARDIAC CATHETERIZATION  2018   CATARACT EXTRACTION     CATARACT EXTRACTION, BILATERAL     CHOLECYSTECTOMY N/A 10/12/2017   Procedure: LAPAROSCOPIC CHOLECYSTECTOMY;  Surgeon: Oza Blumenthal, MD;  Location: [facility] OR;  Service: General;  Laterality: N/A;   CORONARY ARTERY BYPASS GRAFT  2009   ERCP N/A 12/21/2018   Procedure: ENDOSCOPIC RETROGRADE CHOLANGIOPANCREATOGRAPHY (ERCP);  Surgeon: Alvis Jourdain, MD;  Location: [facility] ENDOSCOPY;  Service: Endoscopy;  Laterality: N/A;   ESOPHAGOGASTRODUODENOSCOPY (EGD) WITH PROPOFOL  N/A 12/17/2018   Procedure: ESOPHAGOGASTRODUODENOSCOPY (EGD) WITH PROPOFOL ;  Surgeon: Alvis Jourdain, MD;  Location: [facility] ENDOSCOPY;  Service: Endoscopy;  Laterality: N/A;   EUS Left 12/17/2018   Procedure: UPPER ENDOSCOPIC ULTRASOUND (EUS) LINEAR;  Surgeon: Alvis Jourdain, MD;  Location: [facility] ENDOSCOPY;  Service: Endoscopy;  Laterality: Left;   EYE SURGERY     "for bleeding in eye"   HERNIA REPAIR     LEFT HEART CATH AND CORONARY ANGIOGRAPHY N/A 11/04/2016   Procedure: Left Heart Cath and Coronary Angiography;  Surgeon: Knox Perl, MD;  Location: [facility] INVASIVE CV LAB;  Service: Cardiovascular;  Laterality: N/A;   LOWER EXTREMITY ANGIOGRAPHY N/A 11/04/2016   Procedure: Lower  Extremity Angiography;  Surgeon: Knox Perl, MD;  Location: [facility] INVASIVE CV LAB;  Service: Cardiovascular;  Laterality: N/A;   LOWER EXTREMITY ANGIOGRAPHY N/A 01/03/2020   Procedure: LOWER EXTREMITY ANGIOGRAPHY;  Surgeon: Knox Perl, MD;  Location: [facility] INVASIVE CV LAB;  Service: Cardiovascular;  Laterality: N/A;   LOWER EXTREMITY ANGIOGRAPHY Bilateral 01/14/2022   Procedure: Lower Extremity Angiography;  Surgeon: Knox Perl, MD;  Location: [facility] INVASIVE CV LAB;  Service: Cardiovascular;  Laterality: Bilateral;   LUMBAR LAMINECTOMY/DECOMPRESSION MICRODISCECTOMY Bilateral 11/09/2020   Procedure: Laminectomy and Foraminotomy - bilateral - Lumbar Four-Five.;  Surgeon: Agustina Aldrich, MD;  Location: [facility] OR;  Service:  Neurosurgery;  Laterality: Bilateral;  posterior   PROSTATE SURGERY     REMOVAL OF STONES  12/21/2018   Procedure: REMOVAL OF STONES;  Surgeon: Alvis Jourdain, MD;  Location: [facility] ENDOSCOPY;  Service: Endoscopy;;   SPHINCTEROTOMY  12/21/2018   Procedure: [address];  Surgeon: Alvis Jourdain, MD;  Location: [facility] ENDOSCOPY;  Service: Endoscopy;;   Patient Active Problem List   Diagnosis Date Noted   Osteomyelitis of great toe of left foot (HCC) 06/20/2023   Gangrene of toe of left foot (HCC) 06/19/2023   Normocytic anemia 06/19/2023   Eschar of left great toe 05/22/2023   Near syncope 06/15/2021   Dizziness 06/15/2021   Lumbar stenosis with neurogenic claudication 11/09/2020   CKD stage 3 due to type 2 diabetes mellitus (HCC) 12/14/2019   Asymptomatic bilateral carotid artery stenosis 12/14/2019   Abdominal distention    CAD s/p CABG in 2009 12/14/2018   Acute renal failure superimposed on stage 3 chronic kidney disease (HCC) 12/14/2018   UTI (urinary tract infection) 12/14/2018   HLD (hyperlipidemia) 12/14/2018   Sepsis (HCC) 12/14/2018   Abnormal LFTs    S/P CABG (coronary artery bypass graft) 12/06/2018   Asymptomatic stenosis of right carotid artery 12/06/2018   Claudication in peripheral vascular disease (HCC) 12/06/2018   Orthostatic hypotension due to diabetic dysautonomia 12/06/2018   Elevated liver enzymes 12/06/2018   Abdominal pain 10/12/2018   Abnormal liver function tests 10/12/2018   Foreign body in stomach 10/12/2018   Primary hypertension    GERD (gastroesophageal reflux disease)    Diabetes mellitus without complication (HCC)    Atherosclerosis of native coronary artery of native heart without angina pectoris     PCP: Tita Form, MD   REFERRING PROVIDER: Timothy Ford, MD  ONSET DATE: 09/14/2023 Prosthesis delivery  REFERRING DIAG: E45.409 (ICD-10-CM) - Left below-knee amputee   THERAPY DIAG:  Other abnormalities of gait and mobility  Unsteadiness on  feet  Dizziness and giddiness  Muscle weakness (generalized)  Impaired functional mobility, balance, gait, and endurance  Rationale for Evaluation and Treatment: Rehabilitation  SUBJECTIVE:   SUBJECTIVE STATEMENT: Breaking the HEP into groups seemed to help.    PERTINENT HISTORY: Left TTA 06/24/23, DM2, retinopathy, GERD, HLD, HTN, CAD, CABG 2009, PAD, prostate CA, CKD stage 3b, arthritis, Laminectomy & Foraminotomy bilateral L4-5,   PAIN:  Are you having pain? No, 0/10 pain.  PRECAUTIONS: Fall  WEIGHT BEARING RESTRICTIONS: No  FALLS: Has patient fallen in last 6 months? Yes. Number of falls 1 no injuries  LIVING ENVIRONMENT: Lives with: lives with their family Lives in: House Home Access: Ramped entrance and single step Home layout: Two level, 1/2 bath on main level, and bedroom Stairs: Yes: Internal: 14 steps; on left going up and can reach both and External: 1+1 steps; none Has following equipment at home: Quad cane small base, Walker - 2  wheeled, Wheelchair (manual), shower chair, Shower bench, and bed side commode  OCCUPATION: retired from [employer]  PLOF: Independent used cane & walker   PATIENT GOALS: to use prosthesis to walk in home & community, family wants to be safe,  go upstairs to shower  OBJECTIVE:   POSTURE: Evaluation on 09/16/2023:  rounded shoulders, forward head, flexed trunk , and weight shift right  LOWER EXTREMITY ROM:  ROM P:passive  A:active Right 09/24/23 Left 09/24/23  Hip flexion    Hip extension Andy Bannister -5* Thomas -11*  Hip abduction    Hip adduction    Hip internal rotation    Hip external rotation    Knee flexion    Knee extension  Supine P: -7*  Ankle dorsiflexion    Ankle plantarflexion    Ankle inversion    Ankle eversion     (Blank rows = not tested)  LOWER EXTREMITY MMT:  MMT Left 09/24/23  Hip flexion   Hip extension Sidelying 3-/5  Hip abduction Sidleying 3/5  Hip adduction   Hip internal rotation    Hip external rotation   Knee flexion   Knee extension 3-/5  Ankle dorsiflexion   Ankle plantarflexion   Ankle inversion   Ankle eversion   At Evaluation all strength testing is grossly seated and functionally standing / gait. (Blank rows = not tested)  TRANSFERS: Evaluation on 09/16/2023:  Sit to stand: SBA 20" w/c with armrest to RW limited use of LLE Stand to sit: SBA RW to 20" w/c with armrest limited use of LLE  FUNCTIONAL TESTs:  12/28/2203:  TUG: no device standard 18.28 sec;  with cane std 19.68 sec  12/08/2023: Berg Balance 39/56 TUG: no device standard 29.28 sec;  with cane std 25.68 sec and cognitive 39.75 sec naming states (stopped naming during turns & repeated 2 states) 4-square step test with cane:  30.68   cane & TTA prosthesis, modA for backward step & minA all other directions  32.62   cane & TTA prosthesis, modA for backward step & minA all other directions    11/09/2023 Berg: 35/56   TUG trial 1: 38sec 2 stumbles with pt able to correct without therapist intervention, trial 2: 34sec no stumbles, MinA to CGA  09/24/2023:  Patient able to stand up from 20" w/c with armrest with RW for stabilization with supervision.  Patient able to maintain upright with RW support without assistance.  Patient able to balance without upper extremity support with minA for 60 seconds.  Deferred at evaluation on 09/16/2023 as limb too edematous to don prosthesis  GAIT: 12/28/2023:   Gait velocity:  with TTA prosthesis & cane stand alone tip comfortable /self-selected 2.01 ft/sec with SBA and fast pace 2.51 ft/sec with SBA Pt amb 150' with cane scanning environment was able to maintain path with slight decrease in speed.  Previously he either weaved from path or completely stopped to look.    12/10/2023: 6 Minute Walk Test: with cane 740' with supervision.    Resting HR 73 SpO2 98%  after gait HR 119  SpO2 99%  Modified Borg scale for dyspnea: 3: moderate shortness of breath or  breathing difficulty -pt neg ramp & curb with cane with minA for balance.  He neg ramp & curb with RW safely.  -pt neg 11 steps with single rail & cane with supervision with step-to pattern.   12/08/2023: Functional Gait Assessment with cane: 5/30 indicating high fall risk.  Gait velocity:  with TTA prosthesis &  cane stand alone tip comfortable /self-selected 1.66 ft/sec with SBA and fast pace 2.26 ft/sec with minA.   11/17/2023: Ambulated with stand alone cane CGA with bouts of SBA, step through pattern with inconsistent gait cadence. Was also able to walk short distances without AD and CGA with vc to slow gait down for less instances of tripping.  11/09/2023:  Self selected gait speed: 1.46 ft/sec with stand alone cane, MinA  09/24/2023:   Patient ambulated 50 feet with RW with CGA.  Initially patient walking with a step to pattern with minimal weight shift onto prosthesis.   Deferred at evaluation on 09/16/2023 as limb too edematous to don prosthesis Gait pattern:  Distance walked:  Assistive device utilized:  Level of assistance:  Comments:   PROSTHETIC WEAR ASSESSMENT: 12/08/2023: Pt tolerates wear >90% of awake hours with no skin issues (larger wound that he initially had on tibial crest is completely healed) and reports no residual limb pain.  Pt & dtr verbalize proper prosthetic care with skin check, residual limb care, care of non-amputated limb, prosthetic cleaning, ply sock cleaning, correct ply sock adjustment, proper wear schedule/adjustment, and proper weight-bearing schedule/adjustment.  He requires cues to problem solving new prosthetic issues.    11/11/2023: Patient is wearing prosthesis all awake hours and checking / drying or switching Vivewear under liner every 5 hours.  Wound has one superficial scab 2mm & 3mm at distal portion. Remainder is healed.     Evaluation on 09/16/2023:  Patient is dependent with: skin check, residual limb care, care of non-amputated limb,  prosthetic cleaning, ply sock cleaning, correct ply sock adjustment, proper wear schedule/adjustment, and proper weight-bearing schedule/adjustment Donning prosthesis: Max A Doffing prosthesis: Min A Prosthetic wear tolerance: 30-45 min 2 days ago when prosthesis delivered.  Prosthetic weight bearing tolerance: 3 minutes partial weight bearing trying to engage pin suspension.  Pt reports distal lateral limb pain from wear 2 days ago.  Edema: pitting edema  Residual limb condition: scab dry over tibial crest 5cm wide X 15 cm long.  Dry skin, normal color & temperature. Cylindrical shape Prosthetic description: silicon liner with anterior portion 9mm thick, secondary pelite foam liner, total contact socket,     TODAY'S TREATMENT:                                                                                                                             DATE:  01/18/2024:  Neuromuscular Re-education: Balance activities to facilitate ankle / residual limb & hip strategies: standing on floor with feet 4" apart in corner with chair back in front.   Verbal & tactile cues for equal weight bearing.   head motions right/left, up/down and both diagonals. Eyes open with supervision & eyes closed required minA for stability.  Anticipatory Step strategy stepping off towel roll with cane support & stabilizing without touching //bars. Initial 3 reps ea direction releasing support after he steps while stabilizing, then 5 reps releasing support prior to  stepping:  Forward, backward and side step.  Pt required minA from PT or touch on walls / chair back.  Attempted standing on [place] disc but unable to manage balance enough was beneficial.  Braiding with BUE on //bar 3 laps.      TREATMENT:                                                                                                                             DATE:  01/11/2024: Therapeutic Exercises & Therapeutic Activities PT broke HEP into groups by  location and need for supervision.  PT recommending to try to perform each group 2-3x/wk to enable variety of exercises that he needs without being overwhelmed. PT instructed with demo, verbal & HO cues.  Pt & dtr verbalized understanding after completing in PT.  Group 1 - resistance band - walking balance with band behind you  - 1 x daily - 3 x weekly - 1 sets - 10 reps - 5 seconds hold - walking balance with band in front of you  - 1 x daily - 3 x weekly - 1 sets - 10 reps - 5 seconds hold - walking balance with band to your right  - 1 x daily - 3 x weekly - 1 sets - 10 reps - 5 seconds hold - walking balance with band to your left  - 1 x daily - 3 x weekly - 1 sets - 10 reps - 5 seconds hold  Group 2 - near counter - 4 square stepping with back side to counter  - 1 x daily - 3 x weekly - 1 sets - 3 reps - Backwards Walking  - 1 x daily - 3 x weekly - 1 sets - 10 reps - Sideways Walking  - 1 x daily - 3 x weekly - 1 sets - 10 reps - Carioca with Counter Support  - 1 x daily - 3 x weekly - 1 sets - 10 reps  Group 3 - walking with family supervision (with cane & without device) - Walk with Head Turns  - 1 x daily - 3 x weekly - 1 sets - 10 reps - Walking with Eyes Closed  - 1 x daily - 3 x weekly - 5 sets - 3 reps - Fast Walking  - 1 x daily - 3 x weekly - 5 sets - 3 reps - stair climbing using alternating pattern with 2 rails  - 1 x daily - 3 x weekly - 1 sets - 10 reps  Group 4 - stretches - Seated Hamstring Stretch with Strap  - 1 x daily - 3-5 x weekly - 1 sets - 3 reps - 20-30 seconds hold - Standing Gastroc Stretch on Step  - 1 x daily - 3-5 x weekly - 1 sets - 3 reps - 20-30 seconds hold - Upright Stance at Door Frame Single Arm (can use cane to assist UE motion) - 1-3 x daily -  7 x weekly - 1 sets - 2 reps - 2 deep breathes hold - Upright Stance at Door Frame with Both Arms (can use cane to assist UE motion)   - 1-3 x daily - 7 x weekly - 1 sets - 2 reps - 2 deep breathes  hold     TREATMENT:                                                                                                                             DATE:  01/04/2024: Prosthetic Training with left Transtibial Amputation: Pt amb 100' X 2 without device with supervision.   Neuromuscular Re-education: Balance activities to facilitate ankle / residual limb & hip strategies: standing crossways on foam beam   head motions right /mirror (visual reference for upright)/left, up/mirror/down and both diagonals.  Pt required minA for stability.  Static balance with eyes closed 10 sec 3 reps with minA Anticipatory Step strategy stepping off foam beam & stabilizing without touching //bars.  Forward, backward and side step with ea LE 5 reps.  Pt required touch on //bars and PT assist but improved by 5th rep.   Braiding with BUE on //bar 3 laps.  Rocker board controlled motions ant/post & right/left 1 min ea.  Required BUE support on //bars.      PATIENT EDUCATION: PATIENT EDUCATED ON FOLLOWING PROSTHETIC CARE: Education details:   Skin check, Residual limb care, Prosthetic cleaning, Correct ply sock adjustment, Propper donning, and Proper wear schedule/adjustment Prosthetic wear tolerance: liner only initially 2 hours 3x/day, 7 days/week Person educated: Patient and Child(ren) Education method: Explanation, Demonstration, Tactile cues, and Verbal cues Education comprehension: dtr & granddaughter verbalized understanding, verbal cues required, tactile cues required, and needs further education  HOME EXERCISE PROGRAM: Access Code: [email or web address] URL: [email or web address] Date: 01/11/2024 Prepared by: Lorie Rook  Exercises - walking balance with band behind you  - 1 x daily - 3 x weekly - 1 sets - 10 reps - 5 seconds hold - walking balance with band in front of you  - 1 x daily - 3 x weekly - 1 sets - 10 reps - 5 seconds hold - walking balance with band to your right  - 1 x daily - 3 x  weekly - 1 sets - 10 reps - 5 seconds hold - walking balance with band to your left  - 1 x daily - 3 x weekly - 1 sets - 10 reps - 5 seconds hold - 4 square stepping with back side to counter  - 1 x daily - 3 x weekly - 1 sets - 3 reps - Walk with Head Turns  - 1 x daily - 3 x weekly - 1 sets - 10 reps - Backwards Walking  - 1 x daily - 3 x weekly - 1 sets - 10 reps - Sideways Walking  - 1 x daily - 3 x weekly - 1 sets - 10 reps - Carioca with Counter  Support  - 1 x daily - 3 x weekly - 1 sets - 10 reps - Walking with Eyes Closed  - 1 x daily - 3 x weekly - 5 sets - 3 reps - Fast Walking  - 1 x daily - 3 x weekly - 5 sets - 3 reps - stair climbing using alternating pattern with 2 rails  - 1 x daily - 3 x weekly - 1 sets - 10 reps - Seated Hamstring Stretch with Strap  - 1 x daily - 3-5 x weekly - 1 sets - 3 reps - 20-30 seconds hold - Standing Gastroc Stretch on Step  - 1 x daily - 3-5 x weekly - 1 sets - 3 reps - 20-30 seconds hold - Upright Stance at Door Frame Single Arm  - 1-3 x daily - 7 x weekly - 1 sets - 2 reps - 2 deep breathes hold - Upright Stance at Door Frame with Both Arms  - 1-3 x daily - 7 x weekly - 1 sets - 2 reps - 2 deep breathes hold   ASSESSMENT:  CLINICAL IMPRESSION: PT session focused on balance activities facilitating strategies and input (visual, proprioceptive & vestibular).  He improved with repetition but is still delayed especially step strategy.  Patient will continue to benefit from continued skilled physical therapy to address deficits.    OBJECTIVE IMPAIRMENTS: Abnormal gait, decreased activity tolerance, decreased balance, decreased endurance, decreased knowledge of condition, decreased knowledge of use of DME, decreased mobility, difficulty walking, decreased ROM, decreased strength, increased edema, prosthetic dependency , and pain.   ACTIVITY LIMITATIONS: carrying, lifting, sitting, standing, stairs, transfers, and locomotion level  PARTICIPATION  LIMITATIONS: standing ADLS, cleaning, community activity, and household activity  PERSONAL FACTORS: Age, Fitness, Past/current experiences, Time since onset of injury/illness/exacerbation, and 3+ comorbidities: see PMH  are also affecting patient's functional outcome.   REHAB POTENTIAL: Good  CLINICAL DECISION MAKING: Evolving/moderate complexity  EVALUATION COMPLEXITY: Moderate   GOALS: Goals reviewed with patient? Yes  SHORT TERM GOALS: Target date: 01/11/2024  Patient verbalizes understanding of updated HEP. Baseline: SEE OBJECTIVE DATA Goal status: MET  12/28/2023 2.  Timed Up & Go with cane <22 sec Baseline: SEE OBJECTIVE DATA Goal status: MET  12/28/2023  3.  Patient ambulates 150' with cane scanning environment maintaining path.  Baseline: SEE OBJECTIVE DATA Goal status: MET    12/28/2023    UPDATED LONG TERM GOALS: Target date: 03/09/2024  Patient & family demonstrates & verbalizes ongoing understanding of prosthetic care including problem solving issues & tolerates wear >90% of awake hours without skin or limb pain issues to enable safe utilization of prosthesis. Baseline: SEE OBJECTIVE DATA Goal status: Ongoing    01/18/2024  Timed Up & Go without Assistive device <30 sec safely. Baseline: SEE OBJECTIVE DATA Goal status: Ongoing    01/18/2024  Berg Balance >/= 45/56 to indicate lower fall risk with standing ADLs.  Baseline: SEE OBJECTIVE DATA Goal status:  Ongoing   01/18/2024  4.  Patient ambulates 38' with prosthesis only carrying plate & cup modified independent. Baseline: SEE OBJECTIVE DATA Goal status: Ongoing  01/18/2024  5. 6 Minute Walk Test >900' modified independent with prosthesis & cane. Baseline: SEE OBJECTIVE DATA Goal status:  Ongoing   01/18/2024  6.  Patient negotiates ramps, curbs & stairs with single rail with prosthesis & cane modified independent.  Baseline: SEE OBJECTIVE DATA Goal status:  Ongoing  01/18/2024  7.  Patient demonstrates  improved balance with 4-Square Step Test  with cane <25 sec safely  Baseline: SEE OBJECTIVE DATA Goal status:  Ongoing   01/18/2024    PLAN:  PT FREQUENCY: 1-2x/wk  PT DURATION: 90 days  PLANNED INTERVENTIONS: 97164- PT Re-evaluation, 97110-Therapeutic exercises, 97530- Therapeutic activity, 97112- Neuromuscular re-education, [phone]- Self Care, 81191- Gait training, [phone]- Prosthetic training, Patient/Family education, Balance training, Stair training, DME instructions, and physical performance testing  PLAN FOR NEXT SESSION:  check status of LTGs and ask for more [employer] visits,  continue balance activities. Continue to progress HEP & home activities.   Lorie Rook, PT, DPT 01/18/2024, 11:42 AM   Date of referral: 08/31/2023 Referring provider: Gearldean Keepers, MD Referring diagnosis? F62.130 (ICD-10-CM) - Left below-knee amputee  Treatment diagnosis? (if different than referring diagnosis)  Other abnormalities of gait and mobility  ICD-10-CM: R26.89   Unsteadiness on feet  ICD-10-CM: R26.81   Muscle weakness (generalized)  ICD-10-CM: M62.81   Impaired functional mobility, balance, gait, and endurance  ICD-10-CM: Z74.09   What was this (referring dx) caused by? Surgery (Type: left BKA)  Nature of Condition: Initial Onset (within last 3 months) prosthesis delivery    Laterality: Lt  Current Functional Measure Score: 12/10/2023 Berg Balance 39/56;  11/09/2023 Berg Balance 35/56;  at evalation was standing balance supervision static stance 1 min.  TUG: no device standard 18.28 sec (on 12/08/23 was 29.28 sec);  with cane std 19.68 sec (on 12/08/23 was 25.68 sec) Gait velocity:  with TTA prosthesis & cane stand alone tip comfortable /self-selected 2.01 ft/sec with SBA and fast pace 2.51 ft/sec with SBA.  (On 12/08/2023 was 1.66 ft/sec with SBA and fast pace 2.26 ft/sec with minA.  Objective measurements identify impairments when they are compared to normal values, the uninvolved extremity,  and prior level of function.  [x]  Yes  []  No  Objective assessment of functional ability: Moderate functional limitations   Briefly describe symptoms: Eval was patient is dependent in prosthetic care & use.  He has impaired mobility. Patient is deconditioned from prolonged limited activity with wound, then amputation. 11/11/2023: patient is wearing prosthesis most of awake hours and wound is almost completely healed. Pt has general understanding of prosthetic care.  He is ambulating in home with cane and community with RW.  Previously was w/c bound.  12/10/2023: patient is functioning with family with cane in community.  Functional Outcome Tests indicate high fall risk and he has potential to decrease risk with PT.   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 Constantly  How much have the symptoms interfered with usual daily activities? Extremely  How has condition changed since care began at this facility? Improved - Better  In general, how is the patients overall health? Good   BACK PAIN (STarT Back Screening Tool) No

## 2024-01-21 ENCOUNTER — Encounter: Payer: Self-pay | Admitting: Cardiology

## 2024-01-21 ENCOUNTER — Ambulatory Visit: Payer: Medicare Other | Attending: Cardiology | Admitting: Cardiology

## 2024-01-21 VITALS — BP 138/72 | HR 71 | Ht 67.5 in | Wt 157.8 lb

## 2024-01-21 DIAGNOSIS — E1165 Type 2 diabetes mellitus with hyperglycemia: Secondary | ICD-10-CM | POA: Diagnosis not present

## 2024-01-21 DIAGNOSIS — I951 Orthostatic hypotension: Secondary | ICD-10-CM

## 2024-01-21 DIAGNOSIS — I739 Peripheral vascular disease, unspecified: Secondary | ICD-10-CM

## 2024-01-21 DIAGNOSIS — K551 Chronic vascular disorders of intestine: Secondary | ICD-10-CM | POA: Diagnosis not present

## 2024-01-21 DIAGNOSIS — I2510 Atherosclerotic heart disease of native coronary artery without angina pectoris: Secondary | ICD-10-CM | POA: Diagnosis not present

## 2024-01-21 NOTE — Patient Instructions (Signed)
 Lab Work: Lipid  HGB A1C   If you have labs (blood work) drawn today and your tests are completely normal, you will receive your results only by: MyChart Message (if you have MyChart) OR A paper copy in the mail If you have any lab test that is abnormal or we need to change your treatment, we will call you to review the results.   Follow-Up: At [facility], you and your health needs are our priority.  As part of our continuing mission to provide you with exceptional heart care, our providers are all part of one team.  This team includes your primary Cardiologist (physician) and Advanced Practice Providers or APPs (Physician Assistants and Nurse Practitioners) who all work together to provide you with the care you need, when you need it.  Your next appointment:   1 year(s) for PAD, CAD   Provider:   Knox Perl, MD      Other Instructions       1st Floor: - Lobby - Registration  - Pharmacy  - Lab - Cafe  2nd Floor: - PV Lab - Diagnostic Testing (echo, CT, nuclear med)  3rd Floor: - Vacant  4th Floor: - TCTS (cardiothoracic surgery) - AFib Clinic - Structural Heart Clinic - Vascular Surgery  - Vascular Ultrasound  5th Floor: - HeartCare Cardiology (general and EP) - Clinical Pharmacy for coumadin, hypertension, lipid, weight-loss medications, and med management appointments    Valet parking services will be available as well.

## 2024-01-21 NOTE — Progress Notes (Signed)
 Cardiology Office Note:  .   Date:  01/21/2024  ID:  Justin [name], DOB 1933/07/26, MRN [id number] PCP: Carolyn Cisco, NP  [facility] Providers Cardiologist:  Knox Perl, MD   History of Present Illness: .   Justin [name] is a 89 y.o. male with history of CAD, S/P CABG in 2009 with LIMA to LAD SVG to OM-RI and occluded SVG to RCA, small vessel PAD, due to nonhealing left greater toe ulceration, osteomyelitis, underwent left below-knee amputation on 06/24/2023, pseudoclaudication from spinal stenosis, chronic mesenteric ischemia, type II diabetes with autonomic insufficiency and autonomic orthostatic hypotension, stage IIIa chronic kidney disease, primary hypertension, asymptomatic carotid stenosis, and hyperlipidemia.   Discussed the use of AI scribe software for clinical note transcription with the patient, who gave verbal consent to proceed.  History of Present Illness The patient, with a history of chronic mesenteric ischemia, claudication, coronary artery disease, orthostatic hypotension, and supine hypertension, presents for a follow-up visit after a recent below-knee amputation due to a non-healing ulcer on the left foot. The patient reports feeling better overall since the amputation, with no more pain in the amputated area. However, he mentions that his blood pressure and sugar levels have been "outrageous."  The patient is still attending therapy and can walk, indicating a good recovery from the amputation. The patient also reports no ulcers or wounds on the remaining foot, but mentions a persistent issue on the heel that is not healing as expected.  The patient's weight has been stable, which is a positive sign considering previous concerns about weight loss due to mesenteric ischemic.   Labs   Lab Results  Component Value Date   CHOL 130 03/14/2021   HDL 49 03/14/2021   LDLCALC 62 03/14/2021   TRIG 104 03/14/2021   Lab Results  Component Value Date   NA 134  (L) 06/27/2023   K 4.1 06/27/2023   CO2 25 06/27/2023   GLUCOSE 178 (H) 06/27/2023   BUN 39 (H) 06/27/2023   CREATININE 1.34 (H) 06/27/2023   CALCIUM  8.4 (L) 06/27/2023   EGFR 49 (L) 08/20/2022   GFRNONAA 51 (L) 06/27/2023      Latest Ref Rng & Units 06/27/2023    3:10 AM 06/26/2023    6:45 PM 06/25/2023    3:47 AM  BMP  Glucose 70 - 99 mg/dL 562  130  865   BUN 8 - 23 mg/dL 39  43  30   Creatinine 0.61 - 1.24 mg/dL 7.84  6.96  2.95   Sodium 135 - 145 mmol/L 134  134  133   Potassium 3.5 - 5.1 mmol/L 4.1  4.0  4.5   Chloride 98 - 111 mmol/L 104  103  105   CO2 22 - 32 mmol/L 25  22  20    Calcium  8.9 - 10.3 mg/dL 8.4  8.4  8.1       Latest Ref Rng & Units 06/27/2023    3:10 AM 06/26/2023    6:45 PM 06/25/2023    3:47 AM  CBC  WBC 4.0 - 10.5 K/uL 11.7  12.9  16.5   Hemoglobin 13.0 - 17.0 g/dL 8.7  8.4  7.2   Hematocrit 39.0 - 52.0 % 27.1  26.3  22.3   Platelets 150 - 400 K/uL 245  229  219    Lab Results  Component Value Date   HGBA1C 8.8 (H) 06/19/2023    Lab Results  Component Value Date   TSH 1.235  06/15/2021     Review of Systems  Cardiovascular:  Positive for claudication (mild but none since left BKA and undergoing PT). Negative for chest pain, dyspnea on exertion, leg swelling and syncope.  Neurological:  Positive for dizziness.   Physical Exam:   VS:  BP 138/72 (BP Location: Left Arm, Patient Position: Sitting, Cuff Size: Normal)   Pulse 71   Ht 5' 7.5" (1.715 m)   Wt 157 lb 12.8 oz (71.6 kg)   SpO2 99%   BMI 24.35 kg/m    Wt Readings from Last 3 Encounters:  01/21/24 157 lb 12.8 oz (71.6 kg)  06/19/23 157 lb (71.2 kg)  05/22/23 157 lb 9.6 oz (71.5 kg)    Physical Exam Neck:     Vascular: No carotid bruit or JVD.  Cardiovascular:     Rate and Rhythm: Normal rate and regular rhythm.     Pulses:           Left popliteal pulse not accessible.       Dorsalis pedis pulses are 0 on the right side.       Posterior tibial pulses are 0 on the right side.      Heart sounds: Normal heart sounds. No murmur heard.    No gallop.  Pulmonary:     Effort: Pulmonary effort is normal.     Breath sounds: Normal breath sounds.  Abdominal:     General: Bowel sounds are normal.     Palpations: Abdomen is soft.  Musculoskeletal:        General: Deformity (left BKA) present.     Right lower leg: No edema.     Left lower leg: No edema.    Studies Reviewed: [name]     EKG:    EKG Interpretation Date/Time:  Thursday January 21 2024 09:05:09 EDT Ventricular Rate:  71 PR Interval:  208 QRS Duration:  160 QT Interval:  442 QTC Calculation: 480 R Axis:   -24  Text Interpretation: EKG 01/21/2024: Sinus rhythm with first-degree AV block at rate of 71 bpm, left anterior fascicular block.  Right bundle branch block.  Trifascicular block.  LVH.  Poor R wave progression, cannot exclude anterior infarct old.  Compared to 06/14/2021, no change. Confirmed by Ryot Burrous, Jagadeesh [id number] on 01/21/2024 9:15:22 AM    EKG 03/16/2023: Normal sinus rhythm at rate of 68 bpm, LAE, normal axis, right bundle branch block. Single PAC.   Medications and allergies    Allergies  Allergen Reactions   Contrast Media [Iodinated Contrast Media] Itching    PT STATES ALLERGY TO "CT DYE".   Pletal  [Cilostazol ] Diarrhea   Amlodipine  Swelling    Leg swelling   Dye Fdc Red [Red Dye #40 (Allura Red)] Swelling    CAT scan dye   Ibuprofen Other (See Comments)    Upset stomach      Current Outpatient Medications:    aspirin  (ASPIRIN  CHILDRENS) 81 MG chewable tablet, Chew 1 tablet (81 mg total) by mouth daily. (Patient taking differently: Chew 81 mg by mouth daily in the afternoon.), Disp: , Rfl:    cholecalciferol  (VITAMIN D3) 25 MCG (1000 UNIT) tablet, Take 1,000 Units by mouth daily., Disp: , Rfl:    docusate sodium  (COLACE) 50 MG capsule, Take 100 mg by mouth daily as needed for mild constipation or moderate constipation., Disp: , Rfl:    DULoxetine  (CYMBALTA ) 30 MG capsule, Take 2  capsules by mouth once daily, Disp: 90 capsule, Rfl: 0   ezetimibe  (ZETIA ) 10 MG  tablet, TAKE 1 TABLET DAILY AFTER SUPPER (Patient taking differently: Take 10 mg by mouth at bedtime.), Disp: 90 tablet, Rfl: 3   furosemide  (LASIX ) 40 MG tablet, Take 40 mg by mouth daily., Disp: , Rfl:    hydrALAZINE  (APRESOLINE ) 25 MG tablet, Take 1 tablet (25 mg total) by mouth 4 (four) times daily., Disp: , Rfl:    labetalol  (NORMODYNE ) 100 MG tablet, Take 1 tablet by mouth twice daily, Disp: 180 tablet, Rfl: 1   LANTUS  SOLOSTAR 100 UNIT/ML Solostar Pen, Inject 12 Units into the skin in the morning., Disp: , Rfl:    meclizine  (ANTIVERT ) 25 MG tablet, Take 25 mg by mouth every 6 (six) hours as needed for dizziness., Disp: , Rfl:    omeprazole (PRILOSEC) 40 MG capsule, Take 40 mg by mouth daily. , Disp: , Rfl:    Pyridoxine HCl (B-6 PO), Take 1 capsule by mouth daily., Disp: , Rfl:    rivaroxaban  (XARELTO ) 2.5 MG TABS tablet, Take 1 tablet (2.5 mg total) by mouth 2 (two) times daily., Disp: 180 tablet, Rfl: 1   ropinirole  (REQUIP ) 5 MG tablet, Take 5 mg by mouth at bedtime., Disp: , Rfl:    simvastatin  (ZOCOR ) 20 MG tablet, Take 20 mg by mouth at bedtime., Disp: , Rfl:    spironolactone -hydrochlorothiazide  (ALDACTAZIDE) 25-25 MG tablet, TAKE 1/2 (ONE-HALF) TABLET BY MOUTH IN THE MORNING, Disp: 45 tablet, Rfl: 3   tamsulosin  (FLOMAX ) 0.4 MG CAPS capsule, TAKE 1 CAPSULE BY MOUTH ONCE DAILY AFTER SUPPER, Disp: 90 capsule, Rfl: 3   vitamin B-12 (CYANOCOBALAMIN ) 500 MCG tablet, Take 500 mcg by mouth daily., Disp: , Rfl:    Vitamin E  268 MG (400 UNIT) CAPS, Take 400 Units by mouth daily., Disp: , Rfl:    clonazePAM  (KLONOPIN ) 0.5 MG tablet, Take 1 tablet (0.5 mg total) by mouth daily as needed for up to 5 days for anxiety., Disp: 5 tablet, Rfl: 0   gabapentin  (NEURONTIN ) 100 MG capsule, Take 1 capsule (100 mg total) by mouth 3 (three) times daily., Disp: 90 capsule, Rfl: 0   No orders of the defined types were placed in  this encounter.    Medications Discontinued During This Encounter  Medication Reason   ascorbic acid  (VITAMIN C ) 1000 MG tablet Patient Preference   isosorbide  dinitrate (ISORDIL ) 30 MG tablet Patient Preference   nitroGLYCERIN  (NITRODUR - DOSED IN MG/24 HR) 0.2 mg/hr patch Patient Preference   senna-docusate (SENOKOT-S) 8.6-50 MG tablet Patient Preference   zinc  sulfate 220 (50 Zn) MG capsule Patient Preference     ASSESSMENT AND PLAN: .      ICD-10-CM   1. Chronic mesenteric ischemia (HCC)  K55.1 EKG 12-Lead    2. Claudication in peripheral vascular disease (HCC)  I73.9 EKG 12-Lead    3. Coronary artery disease involving native coronary artery of native heart without angina pectoris  I25.10     4. Type 2 diabetes mellitus with hyperglycemia, without long-term current use of insulin  (HCC)  E11.65     5. Orthostatic hypotension  I95.1 EKG 12-Lead      Assessment and Plan Assessment & Plan Coronary artery disease without angina   Coronary artery disease is well-managed without angina symptoms. He is on statins, aspirin , and labetalol , effectively controlling blood pressure. Recent EKG is normal. Continue simvastatin  20 mg once daily, Zetia  10 mg once daily, aspirin  81 mg once daily, Xarelto  2.5 mg twice daily, and labetalol  as prescribed. Order lipid panel today.  Chronic mesenteric ischemia  Chronic mesenteric ischemia is well-managed with medical therapy. His weight is stable, and surgical intervention is not indicated due to high risk. Continue current medical therapy.  With initiation of aspirin  81 mg daily along with Xarelto  2.5 mg twice daily, he has done well without bleeding diathesis and weight has been stable.  Diabetes with peripheral angiopathy   Diabetes contributes to peripheral angiopathy, with atherosclerosis involving small vessels below the knee. The left foot ulcer led to a below-knee amputation, and the stump has healed well. The right leg shows no wounds, and  circulation is adequate. No intervention is planned. Monitor foot hygiene and ensure careful nail trimming to prevent wounds. Continue current diabetes management. Order A1c today.  Left below-knee amputation   The left below-knee amputation was performed due to an ulcer. The stump has healed well, and he is undergoing therapy and can walk with assistance. No current issues with the right leg. Continue physical therapy as needed. Monitor stump for any signs of infection or complications.  Chronic kidney disease, stage 3   Chronic kidney disease is well-managed. Recent kidney function tests are stable, and he is under regular nephrology care. Order kidney function tests as part of routine blood work.  Orthostatic hypotension   Orthostatic hypotension is well-managed with occasional dizziness. Blood pressure is controlled, today was 138/72 mmHg.     Signed,  Knox Perl, MD, [facility] 01/21/2024, 9:30 AM [facility] [address] [place], [place] [address] Phone: [phone]. Fax:  [phone]

## 2024-01-22 LAB — LIPID PANEL
Chol/HDL Ratio: 2.8 ratio (ref 0.0–5.0)
Cholesterol, Total: 166 mg/dL (ref 100–199)
HDL: 59 mg/dL (ref >39)
LDL Chol Calc (NIH): 92 mg/dL (ref 0–99)
Triglycerides: 82 mg/dL (ref 0–149)
VLDL Cholesterol Cal: 15 mg/dL (ref 5–40)

## 2024-01-22 LAB — HEMOGLOBIN A1C
Est. average glucose Bld gHb Est-mCnc: 237 mg/dL
Hgb A1c MFr Bld: 9.9 % — ABNORMAL HIGH (ref 4.8–5.6)

## 2024-01-23 ENCOUNTER — Encounter: Payer: Self-pay | Admitting: Cardiology

## 2024-01-23 NOTE — Progress Notes (Signed)
 Cholesterol is under excellent control.  Diabetes continues to be uncontrolled, will forward copy to PCP.

## 2024-01-25 ENCOUNTER — Encounter: Admitting: Physical Therapy

## 2024-01-27 ENCOUNTER — Ambulatory Visit: Admitting: Physical Therapy

## 2024-01-27 ENCOUNTER — Encounter: Payer: Self-pay | Admitting: Physical Therapy

## 2024-01-27 DIAGNOSIS — Z7409 Other reduced mobility: Secondary | ICD-10-CM

## 2024-01-27 DIAGNOSIS — R42 Dizziness and giddiness: Secondary | ICD-10-CM

## 2024-01-27 DIAGNOSIS — M6281 Muscle weakness (generalized): Secondary | ICD-10-CM

## 2024-01-27 DIAGNOSIS — R2681 Unsteadiness on feet: Secondary | ICD-10-CM | POA: Diagnosis not present

## 2024-01-27 DIAGNOSIS — R2689 Other abnormalities of gait and mobility: Secondary | ICD-10-CM

## 2024-01-27 NOTE — Therapy (Signed)
 OUTPATIENT PHYSICAL THERAPY PROSTHETIC TREATMENT   Patient Name: Justin [name] MRN: [id number] DOB:[birth year], 89 y.o., male Today's Date: 01/27/2024  END OF SESSION:  PT End of Session - 01/27/24 1258     Visit Number 23    Number of Visits 35    Date for PT Re-Evaluation 03/09/24    Authorization Type [employer] Medicare    Authorization Time Period $20 COPAY Visits 4  12/28/2023 - 01/25/2024    Authorization - Visit Number 4    Authorization - Number of Visits 4    Progress Note Due on Visit 26    PT Start Time 1300    PT Stop Time 1344    PT Time Calculation (min) 44 min    Equipment Utilized During Treatment Gait belt    Activity Tolerance Patient tolerated treatment well    Behavior During Therapy WFL for tasks assessed/performed                            $20 COPAY 16 PT VISITS APPROVED 12/18-2/12/25   Past Medical History:  Diagnosis Date   Anxiety    Arthritis    Carotid arterial disease (HCC)    CKD (chronic kidney disease)    Coronary artery disease    Diabetes mellitus without complication (HCC)    Diabetic retinopathy (HCC)    NPDR OU   Diverticulitis    Dyspnea    GERD (gastroesophageal reflux disease)    History of kidney stones 2021   Hypercholesteremia    Hypertension    Hypertensive retinopathy    OU   Myocardial infarction [facility]) 2009   Pneumonia    as a child   Prostate cancer (HCC)    UTI (lower urinary tract infection)    Past Surgical History:  Procedure Laterality Date   ABDOMINAL AORTOGRAM W/LOWER EXTREMITY N/A 06/22/2023   Procedure: ABDOMINAL AORTOGRAM W/LOWER EXTREMITY;  Surgeon: Adine Hoof, MD;  Location: [facility] INVASIVE CV LAB;  Service: Cardiovascular;  Laterality: N/A;   ABDOMINAL SURGERY     for diverticulitis; also removed appendix   AMPUTATION Left 06/24/2023   Procedure: LEFT BELOW THE KNEE AMPUTATION;  Surgeon: Timothy Ford, MD;  Location: [facility] OR;  Service: Orthopedics;  Laterality: Left;    APPENDECTOMY     CARDIAC CATHETERIZATION  2018   CATARACT EXTRACTION     CATARACT EXTRACTION, BILATERAL     CHOLECYSTECTOMY N/A 10/12/2017   Procedure: LAPAROSCOPIC CHOLECYSTECTOMY;  Surgeon: Oza Blumenthal, MD;  Location: [facility] OR;  Service: General;  Laterality: N/A;   CORONARY ARTERY BYPASS GRAFT  2009   ERCP N/A 12/21/2018   Procedure: ENDOSCOPIC RETROGRADE CHOLANGIOPANCREATOGRAPHY (ERCP);  Surgeon: Alvis Jourdain, MD;  Location: [facility] ENDOSCOPY;  Service: Endoscopy;  Laterality: N/A;   ESOPHAGOGASTRODUODENOSCOPY (EGD) WITH PROPOFOL  N/A 12/17/2018   Procedure: ESOPHAGOGASTRODUODENOSCOPY (EGD) WITH PROPOFOL ;  Surgeon: Alvis Jourdain, MD;  Location: [facility] ENDOSCOPY;  Service: Endoscopy;  Laterality: N/A;   EUS Left 12/17/2018   Procedure: UPPER ENDOSCOPIC ULTRASOUND (EUS) LINEAR;  Surgeon: Alvis Jourdain, MD;  Location: [facility] ENDOSCOPY;  Service: Endoscopy;  Laterality: Left;   EYE SURGERY     "for bleeding in eye"   HERNIA REPAIR     LEFT HEART CATH AND CORONARY ANGIOGRAPHY N/A 11/04/2016   Procedure: Left Heart Cath and Coronary Angiography;  Surgeon: Knox Perl, MD;  Location: [facility] INVASIVE CV LAB;  Service: Cardiovascular;  Laterality: N/A;   LOWER EXTREMITY ANGIOGRAPHY N/A 11/04/2016   Procedure:  Lower Extremity Angiography;  Surgeon: Knox Perl, MD;  Location: [facility] INVASIVE CV LAB;  Service: Cardiovascular;  Laterality: N/A;   LOWER EXTREMITY ANGIOGRAPHY N/A 01/03/2020   Procedure: LOWER EXTREMITY ANGIOGRAPHY;  Surgeon: Knox Perl, MD;  Location: [facility] INVASIVE CV LAB;  Service: Cardiovascular;  Laterality: N/A;   LOWER EXTREMITY ANGIOGRAPHY Bilateral 01/14/2022   Procedure: Lower Extremity Angiography;  Surgeon: Knox Perl, MD;  Location: [facility] INVASIVE CV LAB;  Service: Cardiovascular;  Laterality: Bilateral;   LUMBAR LAMINECTOMY/DECOMPRESSION MICRODISCECTOMY Bilateral 11/09/2020   Procedure: Laminectomy and Foraminotomy - bilateral - Lumbar Four-Five.;  Surgeon: Agustina Aldrich, MD;  Location: [facility] OR;  Service:  Neurosurgery;  Laterality: Bilateral;  posterior   PROSTATE SURGERY     REMOVAL OF STONES  12/21/2018   Procedure: REMOVAL OF STONES;  Surgeon: Alvis Jourdain, MD;  Location: [facility] ENDOSCOPY;  Service: Endoscopy;;   SPHINCTEROTOMY  12/21/2018   Procedure: [address];  Surgeon: Alvis Jourdain, MD;  Location: [facility] ENDOSCOPY;  Service: Endoscopy;;   Patient Active Problem List   Diagnosis Date Noted   Osteomyelitis of great toe of left foot (HCC) 06/20/2023   Gangrene of toe of left foot (HCC) 06/19/2023   Normocytic anemia 06/19/2023   Eschar of left great toe 05/22/2023   Near syncope 06/15/2021   Dizziness 06/15/2021   Lumbar stenosis with neurogenic claudication 11/09/2020   CKD stage 3 due to type 2 diabetes mellitus (HCC) 12/14/2019   Asymptomatic bilateral carotid artery stenosis 12/14/2019   Abdominal distention    CAD s/p CABG in 2009 12/14/2018   Acute renal failure superimposed on stage 3 chronic kidney disease (HCC) 12/14/2018   UTI (urinary tract infection) 12/14/2018   HLD (hyperlipidemia) 12/14/2018   Sepsis (HCC) 12/14/2018   Abnormal LFTs    S/P CABG (coronary artery bypass graft) 12/06/2018   Asymptomatic stenosis of right carotid artery 12/06/2018   Claudication in peripheral vascular disease (HCC) 12/06/2018   Orthostatic hypotension due to diabetic dysautonomia 12/06/2018   Elevated liver enzymes 12/06/2018   Abdominal pain 10/12/2018   Abnormal liver function tests 10/12/2018   Foreign body in stomach 10/12/2018   Primary hypertension    GERD (gastroesophageal reflux disease)    Diabetes mellitus without complication (HCC)    Atherosclerosis of native coronary artery of native heart without angina pectoris     PCP: Carolyn Cisco, NP   REFERRING PROVIDER: Timothy Ford, MD  ONSET DATE: 09/14/2023 Prosthesis delivery  REFERRING DIAG: Z61.096 (ICD-10-CM) - Left below-knee amputee   THERAPY DIAG:  Other abnormalities of gait and mobility  Unsteadiness  on feet  Dizziness and giddiness  Muscle weakness (generalized)  Impaired functional mobility, balance, gait, and endurance  Rationale for Evaluation and Treatment: Rehabilitation  SUBJECTIVE:   SUBJECTIVE STATEMENT: He is wearing prosthesis most of awake hours with no issues.  He is improving his activity level with PT instructions. No falls.   PERTINENT HISTORY: Left TTA 06/24/23, DM2, retinopathy, GERD, HLD, HTN, CAD, CABG 2009, PAD, prostate CA, CKD stage 3b, arthritis, Laminectomy & Foraminotomy bilateral L4-5,   PAIN:  Are you having pain? No, 0/10 pain.  PRECAUTIONS: Fall  WEIGHT BEARING RESTRICTIONS: No  FALLS: Has patient fallen in last 6 months? Yes. Number of falls 1 no injuries  LIVING ENVIRONMENT: Lives with: lives with their family Lives in: House Home Access: Ramped entrance and single step Home layout: Two level, 1/2 bath on main level, and bedroom Stairs: Yes: Internal: 14 steps; on left going up and can reach both and  External: 1+1 steps; none Has following equipment at home: Quad cane small base, Environmental consultant - 2 wheeled, Wheelchair (manual), shower chair, Shower bench, and bed side commode  OCCUPATION: retired from [employer]  PLOF: Independent used cane & walker   PATIENT GOALS: to use prosthesis to walk in home & community, family wants to be safe,  go upstairs to shower  OBJECTIVE:   POSTURE: Evaluation on 09/16/2023:  rounded shoulders, forward head, flexed trunk , and weight shift right  LOWER EXTREMITY ROM:  ROM P:passive  A:active Right 09/24/23 Left 09/24/23  Hip flexion    Hip extension Andy Bannister -5* Thomas -11*  Hip abduction    Hip adduction    Hip internal rotation    Hip external rotation    Knee flexion    Knee extension  Supine P: -7*  Ankle dorsiflexion    Ankle plantarflexion    Ankle inversion    Ankle eversion     (Blank rows = not tested)  LOWER EXTREMITY MMT:  MMT Left 09/24/23  Hip flexion   Hip extension  Sidelying 3-/5  Hip abduction Sidleying 3/5  Hip adduction   Hip internal rotation   Hip external rotation   Knee flexion   Knee extension 3-/5  Ankle dorsiflexion   Ankle plantarflexion   Ankle inversion   Ankle eversion   At Evaluation all strength testing is grossly seated and functionally standing / gait. (Blank rows = not tested)  TRANSFERS: Evaluation on 09/16/2023:  Sit to stand: SBA 20" w/c with armrest to RW limited use of LLE Stand to sit: SBA RW to 20" w/c with armrest limited use of LLE  FUNCTIONAL TESTs:  01/27/2024: TUG: no device standard 17.22 sec;  with cane std 18.18 sec 4-square step test:  (cane) trial 1 30.22 sec with minA (one near fall with minA to prevent fall)   Trial 2 - 22.72 sec with min/CGA no near falls.   12/28/2203:  TUG: no device standard 18.28 sec;  with cane std 19.68 sec  12/08/2023: Berg Balance 39/56 TUG: no device standard 29.28 sec;  with cane std 25.68 sec and cognitive 39.75 sec naming states (stopped naming during turns & repeated 2 states) 4-square step test with cane:  30.68   cane & TTA prosthesis, modA for backward step & minA all other directions  32.62   cane & TTA prosthesis, modA for backward step & minA all other directions    11/09/2023 Berg: 35/56   TUG trial 1: 38sec 2 stumbles with pt able to correct without therapist intervention, trial 2: 34sec no stumbles, MinA to CGA  09/24/2023:  Patient able to stand up from 20" w/c with armrest with RW for stabilization with supervision.  Patient able to maintain upright with RW support without assistance.  Patient able to balance without upper extremity support with minA for 60 seconds.  Deferred at evaluation on 09/16/2023 as limb too edematous to don prosthesis  GAIT: 12/28/2023:   Gait velocity:  with TTA prosthesis & cane stand alone tip comfortable /self-selected 2.01 ft/sec with SBA and fast pace 2.51 ft/sec with SBA Pt amb 150' with cane scanning environment was able  to maintain path with slight decrease in speed.  Previously he either weaved from path or completely stopped to look.    12/10/2023: 6 Minute Walk Test: with cane 740' with supervision.    Resting HR 73 SpO2 98%  after gait HR 119  SpO2 99%  Modified Borg scale for dyspnea: 3: moderate  shortness of breath or breathing difficulty -pt neg ramp & curb with cane with minA for balance.  He neg ramp & curb with RW safely.  -pt neg 11 steps with single rail & cane with supervision with step-to pattern.   12/08/2023: Functional Gait Assessment with cane: 5/30 indicating high fall risk.  Gait velocity:  with TTA prosthesis & cane stand alone tip comfortable /self-selected 1.66 ft/sec with SBA and fast pace 2.26 ft/sec with minA.   11/17/2023: Ambulated with stand alone cane CGA with bouts of SBA, step through pattern with inconsistent gait cadence. Was also able to walk short distances without AD and CGA with vc to slow gait down for less instances of tripping.  11/09/2023:  Self selected gait speed: 1.46 ft/sec with stand alone cane, MinA  09/24/2023:   Patient ambulated 50 feet with RW with CGA.  Initially patient walking with a step to pattern with minimal weight shift onto prosthesis.   Deferred at evaluation on 09/16/2023 as limb too edematous to don prosthesis Gait pattern:  Distance walked:  Assistive device utilized:  Level of assistance:  Comments:   PROSTHETIC WEAR ASSESSMENT: 12/08/2023: Pt tolerates wear >90% of awake hours with no skin issues (larger wound that he initially had on tibial crest is completely healed) and reports no residual limb pain.  Pt & dtr verbalize proper prosthetic care with skin check, residual limb care, care of non-amputated limb, prosthetic cleaning, ply sock cleaning, correct ply sock adjustment, proper wear schedule/adjustment, and proper weight-bearing schedule/adjustment.  He requires cues to problem solving new prosthetic issues.    11/11/2023: Patient is  wearing prosthesis all awake hours and checking / drying or switching Vivewear under liner every 5 hours.  Wound has one superficial scab 2mm & 3mm at distal portion. Remainder is healed.     Evaluation on 09/16/2023:  Patient is dependent with: skin check, residual limb care, care of non-amputated limb, prosthetic cleaning, ply sock cleaning, correct ply sock adjustment, proper wear schedule/adjustment, and proper weight-bearing schedule/adjustment Donning prosthesis: Max A Doffing prosthesis: Min A Prosthetic wear tolerance: 30-45 min 2 days ago when prosthesis delivered.  Prosthetic weight bearing tolerance: 3 minutes partial weight bearing trying to engage pin suspension.  Pt reports distal lateral limb pain from wear 2 days ago.  Edema: pitting edema  Residual limb condition: scab dry over tibial crest 5cm wide X 15 cm long.  Dry skin, normal color & temperature. Cylindrical shape Prosthetic description: silicon liner with anterior portion 9mm thick, secondary pelite foam liner, total contact socket,     TODAY'S TREATMENT:                                                                                                                             DATE:  01/27/2024: Prosthetic training with transtibial prosthesis: See objective data for tug and 4 square testing.  Neuromuscular Re-education: Stand with back to corner with chair back in front of you for safety.  Do 5-10 head motions 4 directions - right/left, up/down, diagonal up-right/down-left and other diagonal up-left/down-right  Position 1: stand on floor with eyes open feet together - perform head motions  Position 2: stand on floor with eyes closed feet about 2-3" apart - perform head motions  Position 3: stand on foam with eyes open feet about 4" apart - perform head motions   Added above to HEP with HO. After performing in clinic, pt verbalized understanding.  PT working on step strategy by standing on towel roll and stepping  off trying to stabilize without having to touch external support with each lower extremity -forward, backward, to left and to the right.  Patient improved step strategy significantly with right lower extremity step and slightly with left prosthetic step.      TREATMENT:                                                                                                                             DATE:  01/18/2024:  Neuromuscular Re-education: Balance activities to facilitate ankle / residual limb & hip strategies: standing on floor with feet 4" apart in corner with chair back in front.   Verbal & tactile cues for equal weight bearing.   head motions right/left, up/down and both diagonals. Eyes open with supervision & eyes closed required minA for stability.  Anticipatory Step strategy stepping off towel roll with cane support & stabilizing without touching //bars. Initial 3 reps ea direction releasing support after he steps while stabilizing, then 5 reps releasing support prior to stepping:  Forward, backward and side step.  Pt required minA from PT or touch on walls / chair back.  Attempted standing on [place] disc but unable to manage balance enough was beneficial.  Braiding with BUE on //bar 3 laps.      TREATMENT:                                                                                                                             DATE:  01/11/2024: Therapeutic Exercises & Therapeutic Activities PT broke HEP into groups by location and need for supervision.  PT recommending to try to perform each group 2-3x/wk to enable variety of exercises that he needs without being overwhelmed. PT instructed with demo, verbal & HO cues.  Pt & dtr verbalized understanding after completing in PT.  Group 1 - resistance band - walking balance with band behind  you  - 1 x daily - 3 x weekly - 1 sets - 10 reps - 5 seconds hold - walking balance with band in front of you  - 1 x daily - 3 x weekly - 1 sets - 10  reps - 5 seconds hold - walking balance with band to your right  - 1 x daily - 3 x weekly - 1 sets - 10 reps - 5 seconds hold - walking balance with band to your left  - 1 x daily - 3 x weekly - 1 sets - 10 reps - 5 seconds hold  Group 2 - near counter - 4 square stepping with back side to counter  - 1 x daily - 3 x weekly - 1 sets - 3 reps - Backwards Walking  - 1 x daily - 3 x weekly - 1 sets - 10 reps - Sideways Walking  - 1 x daily - 3 x weekly - 1 sets - 10 reps - Carioca with Counter Support  - 1 x daily - 3 x weekly - 1 sets - 10 reps  Group 3 - walking with family supervision (with cane & without device) - Walk with Head Turns  - 1 x daily - 3 x weekly - 1 sets - 10 reps - Walking with Eyes Closed  - 1 x daily - 3 x weekly - 5 sets - 3 reps - Fast Walking  - 1 x daily - 3 x weekly - 5 sets - 3 reps - stair climbing using alternating pattern with 2 rails  - 1 x daily - 3 x weekly - 1 sets - 10 reps  Group 4 - stretches - Seated Hamstring Stretch with Strap  - 1 x daily - 3-5 x weekly - 1 sets - 3 reps - 20-30 seconds hold - Standing Gastroc Stretch on Step  - 1 x daily - 3-5 x weekly - 1 sets - 3 reps - 20-30 seconds hold - Upright Stance at Door Frame Single Arm (can use cane to assist UE motion) - 1-3 x daily - 7 x weekly - 1 sets - 2 reps - 2 deep breathes hold - Upright Stance at Door Frame with Both Arms (can use cane to assist UE motion)   - 1-3 x daily - 7 x weekly - 1 sets - 2 reps - 2 deep breathes hold      PATIENT EDUCATION: PATIENT EDUCATED ON FOLLOWING PROSTHETIC CARE: Education details:   Skin check, Residual limb care, Prosthetic cleaning, Correct ply sock adjustment, Propper donning, and Proper wear schedule/adjustment Prosthetic wear tolerance: liner only initially 2 hours 3x/day, 7 days/week Person educated: Patient and Child(ren) Education method: Explanation, Demonstration, Tactile cues, and Verbal cues Education comprehension: dtr & granddaughter verbalized  understanding, verbal cues required, tactile cues required, and needs further education  HOME EXERCISE PROGRAM: Access Code: [email or web address] URL: [email or web address] Date: 01/11/2024 Prepared by: Lorie Rook  Exercises - walking balance with band behind you  - 1 x daily - 3 x weekly - 1 sets - 10 reps - 5 seconds hold - walking balance with band in front of you  - 1 x daily - 3 x weekly - 1 sets - 10 reps - 5 seconds hold - walking balance with band to your right  - 1 x daily - 3 x weekly - 1 sets - 10 reps - 5 seconds hold - walking balance with band to your left  - 1 x daily - 3  x weekly - 1 sets - 10 reps - 5 seconds hold - 4 square stepping with back side to counter  - 1 x daily - 3 x weekly - 1 sets - 3 reps - Walk with Head Turns  - 1 x daily - 3 x weekly - 1 sets - 10 reps - Backwards Walking  - 1 x daily - 3 x weekly - 1 sets - 10 reps - Sideways Walking  - 1 x daily - 3 x weekly - 1 sets - 10 reps - Carioca with Counter Support  - 1 x daily - 3 x weekly - 1 sets - 10 reps - Walking with Eyes Closed  - 1 x daily - 3 x weekly - 5 sets - 3 reps - Fast Walking  - 1 x daily - 3 x weekly - 5 sets - 3 reps - stair climbing using alternating pattern with 2 rails  - 1 x daily - 3 x weekly - 1 sets - 10 reps - Seated Hamstring Stretch with Strap  - 1 x daily - 3-5 x weekly - 1 sets - 3 reps - 20-30 seconds hold - Standing Gastroc Stretch on Step  - 1 x daily - 3-5 x weekly - 1 sets - 3 reps - 20-30 seconds hold - Upright Stance at Door Frame Single Arm  - 1-3 x daily - 7 x weekly - 1 sets - 2 reps - 2 deep breathes hold - Upright Stance at Door Frame with Both Arms  - 1-3 x daily - 7 x weekly - 1 sets - 2 reps - 2 deep breathes hold   ASSESSMENT:  CLINICAL IMPRESSION: Patient is improving functional activities that indicate high risk of falls as noted by decreased time in TUG and 4 square stepping tests.  PT instructed patient in balance activities that facilitate improved  ankle/residual limb and hip strategies that he can perform in a corner at home for safety.  PT continues to work on step strategy in the clinic but at this time is not safe to perform outside of therapy.  Patient will continue to benefit from continued skilled physical therapy to address deficits.    OBJECTIVE IMPAIRMENTS: Abnormal gait, decreased activity tolerance, decreased balance, decreased endurance, decreased knowledge of condition, decreased knowledge of use of DME, decreased mobility, difficulty walking, decreased ROM, decreased strength, increased edema, prosthetic dependency , and pain.   ACTIVITY LIMITATIONS: carrying, lifting, sitting, standing, stairs, transfers, and locomotion level  PARTICIPATION LIMITATIONS: standing ADLS, cleaning, community activity, and household activity  PERSONAL FACTORS: Age, Fitness, Past/current experiences, Time since onset of injury/illness/exacerbation, and 3+ comorbidities: see PMH  are also affecting patient's functional outcome.   REHAB POTENTIAL: Good  CLINICAL DECISION MAKING: Evolving/moderate complexity  EVALUATION COMPLEXITY: Moderate   GOALS: Goals reviewed with patient? Yes  SHORT TERM GOALS: Target date: 01/11/2024  Patient verbalizes understanding of updated HEP. Baseline: SEE OBJECTIVE DATA Goal status: MET  12/28/2023 2.  Timed Up & Go with cane <22 sec Baseline: SEE OBJECTIVE DATA Goal status: MET  12/28/2023  3.  Patient ambulates 150' with cane scanning environment maintaining path.  Baseline: SEE OBJECTIVE DATA Goal status: MET    12/28/2023    UPDATED LONG TERM GOALS: Target date: 03/09/2024  Patient & family demonstrates & verbalizes ongoing understanding of prosthetic care including problem solving issues & tolerates wear >90% of awake hours without skin or limb pain issues to enable safe utilization of prosthesis. Baseline: SEE OBJECTIVE DATA Goal  status: Ongoing    01/27/2024  Timed Up & Go without Assistive device  <30 sec safely. Baseline: SEE OBJECTIVE DATA Goal status: Ongoing    01/27/2024  Berg Balance >/= 45/56 to indicate lower fall risk with standing ADLs.  Baseline: SEE OBJECTIVE DATA Goal status:  Ongoing   01/27/2024  4.  Patient ambulates 22' with prosthesis only carrying plate & cup modified independent. Baseline: SEE OBJECTIVE DATA Goal status: Ongoing  01/27/2024  5. 6 Minute Walk Test >900' modified independent with prosthesis & cane. Baseline: SEE OBJECTIVE DATA Goal status:  Ongoing   01/27/2024  6.  Patient negotiates ramps, curbs & stairs with single rail with prosthesis & cane modified independent.  Baseline: SEE OBJECTIVE DATA Goal status:  Ongoing  01/27/2024  7.  Patient demonstrates improved balance with 4-Square Step Test with cane <25 sec safely  Baseline: SEE OBJECTIVE DATA Goal status:  Ongoing   01/27/2024    PLAN:  PT FREQUENCY: 1-2x/wk  PT DURATION: 90 days  PLANNED INTERVENTIONS: 97164- PT Re-evaluation, 97110-Therapeutic exercises, 97530- Therapeutic activity, 97112- Neuromuscular re-education, [phone]- Self Care, 19147- Gait training, [phone]- Prosthetic training, Patient/Family education, Balance training, Stair training, DME instructions, and physical performance testing  PLAN FOR NEXT SESSION: Check if approved for more [employer] visits,  continue balance activities to facilitate step strategy. Continue to progress HEP & home activities.   Lorie Rook, PT, DPT 01/27/2024, 3:55 PM   Date of referral: 08/31/2023 Referring provider: Gearldean Keepers, MD Referring diagnosis? O13.086 (ICD-10-CM) - Left below-knee amputee  Treatment diagnosis? (if different than referring diagnosis)  Other abnormalities of gait and mobility  ICD-10-CM: R26.89   Unsteadiness on feet  ICD-10-CM: R26.81   Muscle weakness (generalized)  ICD-10-CM: M62.81   Impaired functional mobility, balance, gait, and endurance  ICD-10-CM: Z74.09   What was this (referring dx) caused by? Surgery  (Type: left BKA)  Nature of Condition: Initial Onset (within last 3 months) prosthesis delivery    Laterality: Lt  Current Functional Measure Score: 12/10/2023 Berg Balance 39/56;  11/09/2023 Berg Balance 35/56;  at evalation was standing balance supervision static stance 1 min.  TUG: no device standard 18.28 sec (on 12/08/23 was 29.28 sec);  with cane std 19.68 sec (on 12/08/23 was 25.68 sec) Gait velocity:  with TTA prosthesis & cane stand alone tip comfortable /self-selected 2.01 ft/sec with SBA and fast pace 2.51 ft/sec with SBA.  (On 12/08/2023 was 1.66 ft/sec with SBA and fast pace 2.26 ft/sec with minA.  01/27/2024: TUG: no device standard 17.22 sec;  with cane std 18.18 sec 4-square step test:  (cane) trial 1 30.22 sec with minA (one near fall with minA to prevent fall)   Trial 2 - 22.72 sec with min/CGA no near falls.   Objective measurements identify impairments when they are compared to normal values, the uninvolved extremity, and prior level of function.  [x]  Yes  []  No  Objective assessment of functional ability: Moderate functional limitations   Briefly describe symptoms: Eval was patient is dependent in prosthetic care & use.  He has impaired mobility. Patient is deconditioned from prolonged limited activity with wound, then amputation. 11/11/2023: patient is wearing prosthesis most of awake hours and wound is almost completely healed. Pt has general understanding of prosthetic care.  He is ambulating in home with cane and community with RW.  Previously was w/c bound.  12/10/2023: patient is functioning with family with cane in community.  Functional Outcome Tests indicate high fall risk and he has potential  to decrease risk with PT.   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 Constantly  How much have the symptoms interfered with usual daily  activities? Extremely  How has condition changed since care began at this facility? Improved - Better  In general, how is the patients overall health? Good   BACK PAIN (STarT Back Screening Tool) No

## 2024-01-27 NOTE — Patient Instructions (Signed)
 Stand with back to corner with chair back in front of you for safety. Do 5-10 head motions 4 directions - right/left, up/down, diagonal up-right/down-left and other diagonal up-left/down-right  Position 1: stand on floor with eyes open feet together - perform head motions  Position 2: stand on floor with eyes closed feet about 2-3" apart - perform head motions  Position 3: stand on foam with eyes open feet about 4" apart - perform head motions

## 2024-02-01 ENCOUNTER — Encounter: Payer: Self-pay | Admitting: Orthopedic Surgery

## 2024-02-01 ENCOUNTER — Ambulatory Visit: Payer: Medicare Other | Admitting: Orthopedic Surgery

## 2024-02-01 DIAGNOSIS — M8440XA Pathological fracture, unspecified site, initial encounter for fracture: Secondary | ICD-10-CM | POA: Diagnosis not present

## 2024-02-01 DIAGNOSIS — Z89512 Acquired absence of left leg below knee: Secondary | ICD-10-CM | POA: Diagnosis not present

## 2024-02-01 NOTE — Progress Notes (Signed)
 Office Visit Note   Patient: Justin [name]           Date of Birth: October 23, 1932           MRN: [id number] Visit Date: 02/01/2024              Requested by: Tita Form, MD [address] [place],  [place] [address] PCP: Carolyn Cisco, NP  Chief Complaint  Patient presents with   Left Leg - Routine Post Op    06/24/2023 left BKA      HPI: Patient is an 89 year old gentleman who is seen in follow-up status post left below-knee amputation September 25 of last year.  Patient has currently had modifications to his socket wearing increased socks and still has and bearing pain and instability with wearing his prosthesis.  Assessment & Plan: Visit Diagnoses:  1. Left below-knee amputee [facility])     Plan: Patient was provided prescription for [employer] for new socket liner materials and supplies.  Follow-Up Instructions: Return if symptoms worsen or fail to improve.   Ortho Exam  Patient is alert, oriented, no adenopathy, well-dressed, normal affect, normal respiratory effort. Examination of the residual limb his leg is well consolidated he does have decreased volume in the residual limb.  There is no open ulcers.  Patient has exhausted modifications available to use his existing socket.  Patient is an existing left transtibial  amputee.  Patient's current comorbidities are not expected to impact the ability to function with the prescribed prosthesis. Patient verbally communicates a strong desire to use a prosthesis. Patient currently requires mobility aids to ambulate without a prosthesis.  Expects not to use mobility aids with a new prosthesis. Patient is expected to resume or reach their K Level within 6 months. Patient was active before the amputation and independent with stairs, uneven terrain, varying cadence, and a community ambulator.  Patient is a K3 level ambulator that spends a lot of time walking around on uneven terrain over obstacles, up and down stairs, and ambulates  with a variable cadence.     Imaging: No results found. No images are attached to the encounter.  Labs: Lab Results  Component Value Date   HGBA1C 9.9 (H) 01/21/2024   HGBA1C 8.8 (H) 06/19/2023   HGBA1C 7.2 (H) 11/06/2020   ESRSEDRATE 106 (H) 06/19/2023   ESRSEDRATE 16 12/01/2017   CRP 4.5 (H) 06/19/2023   REPTSTATUS 06/25/2023 FINAL 06/20/2023   CULT  06/20/2023    NO GROWTH 5 DAYS Performed at [facility], [address] [place], [place] [address]    LABORGA KLEBSIELLA OXYTOCA (A) 03/08/2021     Lab Results  Component Value Date   ALBUMIN 2.9 (L) 06/16/2021   ALBUMIN 3.4 (L) 06/14/2021   ALBUMIN 4.1 03/21/2019    Lab Results  Component Value Date   MG 1.8 06/16/2021   MG 1.6 (L) 12/23/2018   MG 1.7 12/22/2018   No results found for: "VD25OH"  No results found for: "PREALBUMIN"    Latest Ref Rng & Units 06/27/2023    3:10 AM 06/26/2023    6:45 PM 06/25/2023    3:47 AM  CBC EXTENDED  WBC 4.0 - 10.5 K/uL 11.7  12.9  16.5   RBC 4.22 - 5.81 MIL/uL 3.30  3.18  2.65   Hemoglobin 13.0 - 17.0 g/dL 8.7  8.4  7.2   HCT 86.5 - 52.0 % 27.1  26.3  22.3   Platelets 150 - 400  K/uL 245  229  219      There is no height or weight on file to calculate BMI.  Orders:  No orders of the defined types were placed in this encounter.  No orders of the defined types were placed in this encounter.    Procedures: No procedures performed  Clinical Data: No additional findings.  ROS:  All other systems negative, except as noted in the HPI. Review of Systems  Objective: Vital Signs: There were no vitals taken for this visit.  Specialty Comments:  No specialty comments available.  PMFS History: Patient Active Problem List   Diagnosis Date Noted   Osteomyelitis of great toe of left foot (HCC) 06/20/2023   Gangrene of toe of left foot (HCC) 06/19/2023   Normocytic anemia 06/19/2023   Eschar of left great toe 05/22/2023   Near syncope 06/15/2021    Dizziness 06/15/2021   Lumbar stenosis with neurogenic claudication 11/09/2020   CKD stage 3 due to type 2 diabetes mellitus (HCC) 12/14/2019   Asymptomatic bilateral carotid artery stenosis 12/14/2019   Abdominal distention    CAD s/p CABG in 2009 12/14/2018   Acute renal failure superimposed on stage 3 chronic kidney disease (HCC) 12/14/2018   UTI (urinary tract infection) 12/14/2018   HLD (hyperlipidemia) 12/14/2018   Sepsis (HCC) 12/14/2018   Abnormal LFTs    S/P CABG (coronary artery bypass graft) 12/06/2018   Asymptomatic stenosis of right carotid artery 12/06/2018   Claudication in peripheral vascular disease (HCC) 12/06/2018   Orthostatic hypotension due to diabetic dysautonomia 12/06/2018   Elevated liver enzymes 12/06/2018   Abdominal pain 10/12/2018   Abnormal liver function tests 10/12/2018   Foreign body in stomach 10/12/2018   Primary hypertension    GERD (gastroesophageal reflux disease)    Diabetes mellitus without complication (HCC)    Atherosclerosis of native coronary artery of native heart without angina pectoris    Past Medical History:  Diagnosis Date   Anxiety    Arthritis    Carotid arterial disease (HCC)    CKD (chronic kidney disease)    Coronary artery disease    Diabetes mellitus without complication (HCC)    Diabetic retinopathy (HCC)    NPDR OU   Diverticulitis    Dyspnea    GERD (gastroesophageal reflux disease)    History of kidney stones 2021   Hypercholesteremia    Hypertension    Hypertensive retinopathy    OU   Myocardial infarction [facility]) 2009   Pneumonia    as a child   Prostate cancer (HCC)    UTI (lower urinary tract infection)     Family History  Problem Relation Age of Onset   Diabetes Father    Diabetes Maternal Aunt    Diabetes Maternal Uncle    Diabetes Maternal Grandmother    Heart disease Sister    Diabetes Brother    Heart disease Sister     Past Surgical History:  Procedure Laterality Date   ABDOMINAL  AORTOGRAM W/LOWER EXTREMITY N/A 06/22/2023   Procedure: ABDOMINAL AORTOGRAM W/LOWER EXTREMITY;  Surgeon: Adine Hoof, MD;  Location: [facility] INVASIVE CV LAB;  Service: Cardiovascular;  Laterality: N/A;   ABDOMINAL SURGERY     for diverticulitis; also removed appendix   AMPUTATION Left 06/24/2023   Procedure: LEFT BELOW THE KNEE AMPUTATION;  Surgeon: Timothy Ford, MD;  Location: [facility] OR;  Service: Orthopedics;  Laterality: Left;   APPENDECTOMY     CARDIAC CATHETERIZATION  2018   CATARACT EXTRACTION  CATARACT EXTRACTION, BILATERAL     CHOLECYSTECTOMY N/A 10/12/2017   Procedure: LAPAROSCOPIC CHOLECYSTECTOMY;  Surgeon: Oza Blumenthal, MD;  Location: [facility] OR;  Service: General;  Laterality: N/A;   CORONARY ARTERY BYPASS GRAFT  2009   ERCP N/A 12/21/2018   Procedure: ENDOSCOPIC RETROGRADE CHOLANGIOPANCREATOGRAPHY (ERCP);  Surgeon: Alvis Jourdain, MD;  Location: [facility] ENDOSCOPY;  Service: Endoscopy;  Laterality: N/A;   ESOPHAGOGASTRODUODENOSCOPY (EGD) WITH PROPOFOL  N/A 12/17/2018   Procedure: ESOPHAGOGASTRODUODENOSCOPY (EGD) WITH PROPOFOL ;  Surgeon: Alvis Jourdain, MD;  Location: [facility] ENDOSCOPY;  Service: Endoscopy;  Laterality: N/A;   EUS Left 12/17/2018   Procedure: UPPER ENDOSCOPIC ULTRASOUND (EUS) LINEAR;  Surgeon: Alvis Jourdain, MD;  Location: [facility] ENDOSCOPY;  Service: Endoscopy;  Laterality: Left;   EYE SURGERY     "for bleeding in eye"   HERNIA REPAIR     LEFT HEART CATH AND CORONARY ANGIOGRAPHY N/A 11/04/2016   Procedure: Left Heart Cath and Coronary Angiography;  Surgeon: Knox Perl, MD;  Location: [facility] INVASIVE CV LAB;  Service: Cardiovascular;  Laterality: N/A;   LOWER EXTREMITY ANGIOGRAPHY N/A 11/04/2016   Procedure: Lower Extremity Angiography;  Surgeon: Knox Perl, MD;  Location: [facility] INVASIVE CV LAB;  Service: Cardiovascular;  Laterality: N/A;   LOWER EXTREMITY ANGIOGRAPHY N/A 01/03/2020   Procedure: LOWER EXTREMITY ANGIOGRAPHY;  Surgeon: Knox Perl, MD;  Location: [facility] INVASIVE CV LAB;  Service:  Cardiovascular;  Laterality: N/A;   LOWER EXTREMITY ANGIOGRAPHY Bilateral 01/14/2022   Procedure: Lower Extremity Angiography;  Surgeon: Knox Perl, MD;  Location: [facility] INVASIVE CV LAB;  Service: Cardiovascular;  Laterality: Bilateral;   LUMBAR LAMINECTOMY/DECOMPRESSION MICRODISCECTOMY Bilateral 11/09/2020   Procedure: Laminectomy and Foraminotomy - bilateral - Lumbar Four-Five.;  Surgeon: Agustina Aldrich, MD;  Location: [facility] OR;  Service: Neurosurgery;  Laterality: Bilateral;  posterior   PROSTATE SURGERY     REMOVAL OF STONES  12/21/2018   Procedure: REMOVAL OF STONES;  Surgeon: Alvis Jourdain, MD;  Location: [facility] ENDOSCOPY;  Service: Endoscopy;;   SPHINCTEROTOMY  12/21/2018   Procedure: [address];  Surgeon: Alvis Jourdain, MD;  Location: [facility] ENDOSCOPY;  Service: Endoscopy;;   Social History   Occupational History   Not on file  Tobacco Use   Smoking status: Former    Current packs/day: 0.25    Average packs/day: 0.3 packs/day for 2.0 years (0.5 ttl pk-yrs)    Types: Cigarettes   Smokeless tobacco: Never   Tobacco comments:    when he was a teenage age [age]  Vaping Use   Vaping status: Never Used  Substance and Sexual Activity   Alcohol use: No   Drug use: No   Sexual activity: Not Currently

## 2024-02-09 ENCOUNTER — Ambulatory Visit: Admitting: Physical Therapy

## 2024-02-09 ENCOUNTER — Encounter: Payer: Self-pay | Admitting: Physical Therapy

## 2024-02-09 ENCOUNTER — Encounter: Admitting: Physical Therapy

## 2024-02-09 DIAGNOSIS — R2689 Other abnormalities of gait and mobility: Secondary | ICD-10-CM | POA: Diagnosis not present

## 2024-02-09 DIAGNOSIS — Z7409 Other reduced mobility: Secondary | ICD-10-CM

## 2024-02-09 DIAGNOSIS — M6281 Muscle weakness (generalized): Secondary | ICD-10-CM

## 2024-02-09 DIAGNOSIS — R42 Dizziness and giddiness: Secondary | ICD-10-CM

## 2024-02-09 DIAGNOSIS — R2681 Unsteadiness on feet: Secondary | ICD-10-CM

## 2024-02-09 NOTE — Therapy (Signed)
 OUTPATIENT PHYSICAL THERAPY PROSTHETIC TREATMENT   Patient Name: Justin [name] MRN: [id number] DOB:November 19, 1932, 89 y.o., male Today's Date: 02/09/2024  END OF SESSION:  PT End of Session - 02/09/24 0759     Visit Number 24    Number of Visits 35    Date for PT Re-Evaluation 03/09/24    Authorization Type [employer] Medicare    Authorization Time Period $20 co-pay   5 PT VISITS 5/6-6/11    Authorization - Visit Number 1    Authorization - Number of Visits 5    Progress Note Due on Visit 26    PT Start Time 0758    PT Stop Time 0840    PT Time Calculation (min) 42 min    Equipment Utilized During Treatment Gait belt    Activity Tolerance Patient tolerated treatment well    Behavior During Therapy WFL for tasks assessed/performed                             $20 COPAY 16 PT VISITS APPROVED 12/18-2/12/25   Past Medical History:  Diagnosis Date   Anxiety    Arthritis    Carotid arterial disease (HCC)    CKD (chronic kidney disease)    Coronary artery disease    Diabetes mellitus without complication (HCC)    Diabetic retinopathy (HCC)    NPDR OU   Diverticulitis    Dyspnea    GERD (gastroesophageal reflux disease)    History of kidney stones 2021   Hypercholesteremia    Hypertension    Hypertensive retinopathy    OU   Myocardial infarction [facility]) 2009   Pneumonia    as a child   Prostate cancer (HCC)    UTI (lower urinary tract infection)    Past Surgical History:  Procedure Laterality Date   ABDOMINAL AORTOGRAM W/LOWER EXTREMITY N/A 06/22/2023   Procedure: ABDOMINAL AORTOGRAM W/LOWER EXTREMITY;  Surgeon: Adine Hoof, MD;  Location: [facility] INVASIVE CV LAB;  Service: Cardiovascular;  Laterality: N/A;   ABDOMINAL SURGERY     for diverticulitis; also removed appendix   AMPUTATION Left 06/24/2023   Procedure: LEFT BELOW THE KNEE AMPUTATION;  Surgeon: Timothy Ford, MD;  Location: [facility] OR;  Service: Orthopedics;  Laterality: Left;   APPENDECTOMY      CARDIAC CATHETERIZATION  2018   CATARACT EXTRACTION     CATARACT EXTRACTION, BILATERAL     CHOLECYSTECTOMY N/A 10/12/2017   Procedure: LAPAROSCOPIC CHOLECYSTECTOMY;  Surgeon: Oza Blumenthal, MD;  Location: [facility] OR;  Service: General;  Laterality: N/A;   CORONARY ARTERY BYPASS GRAFT  2009   ERCP N/A 12/21/2018   Procedure: ENDOSCOPIC RETROGRADE CHOLANGIOPANCREATOGRAPHY (ERCP);  Surgeon: Alvis Jourdain, MD;  Location: [facility] ENDOSCOPY;  Service: Endoscopy;  Laterality: N/A;   ESOPHAGOGASTRODUODENOSCOPY (EGD) WITH PROPOFOL  N/A 12/17/2018   Procedure: ESOPHAGOGASTRODUODENOSCOPY (EGD) WITH PROPOFOL ;  Surgeon: Alvis Jourdain, MD;  Location: [facility] ENDOSCOPY;  Service: Endoscopy;  Laterality: N/A;   EUS Left 12/17/2018   Procedure: UPPER ENDOSCOPIC ULTRASOUND (EUS) LINEAR;  Surgeon: Alvis Jourdain, MD;  Location: [facility] ENDOSCOPY;  Service: Endoscopy;  Laterality: Left;   EYE SURGERY     "for bleeding in eye"   HERNIA REPAIR     LEFT HEART CATH AND CORONARY ANGIOGRAPHY N/A 11/04/2016   Procedure: Left Heart Cath and Coronary Angiography;  Surgeon: Knox Perl, MD;  Location: [facility] INVASIVE CV LAB;  Service: Cardiovascular;  Laterality: N/A;   LOWER EXTREMITY ANGIOGRAPHY N/A 11/04/2016  Procedure: Lower Extremity Angiography;  Surgeon: Knox Perl, MD;  Location: [facility] INVASIVE CV LAB;  Service: Cardiovascular;  Laterality: N/A;   LOWER EXTREMITY ANGIOGRAPHY N/A 01/03/2020   Procedure: LOWER EXTREMITY ANGIOGRAPHY;  Surgeon: Knox Perl, MD;  Location: [facility] INVASIVE CV LAB;  Service: Cardiovascular;  Laterality: N/A;   LOWER EXTREMITY ANGIOGRAPHY Bilateral 01/14/2022   Procedure: Lower Extremity Angiography;  Surgeon: Knox Perl, MD;  Location: [facility] INVASIVE CV LAB;  Service: Cardiovascular;  Laterality: Bilateral;   LUMBAR LAMINECTOMY/DECOMPRESSION MICRODISCECTOMY Bilateral 11/09/2020   Procedure: Laminectomy and Foraminotomy - bilateral - Lumbar Four-Five.;  Surgeon: Agustina Aldrich, MD;  Location: [facility] OR;  Service: Neurosurgery;   Laterality: Bilateral;  posterior   PROSTATE SURGERY     REMOVAL OF STONES  12/21/2018   Procedure: REMOVAL OF STONES;  Surgeon: Alvis Jourdain, MD;  Location: [facility] ENDOSCOPY;  Service: Endoscopy;;   SPHINCTEROTOMY  12/21/2018   Procedure: [address];  Surgeon: Alvis Jourdain, MD;  Location: [facility] ENDOSCOPY;  Service: Endoscopy;;   Patient Active Problem List   Diagnosis Date Noted   Osteomyelitis of great toe of left foot (HCC) 06/20/2023   Gangrene of toe of left foot (HCC) 06/19/2023   Normocytic anemia 06/19/2023   Eschar of left great toe 05/22/2023   Near syncope 06/15/2021   Dizziness 06/15/2021   Lumbar stenosis with neurogenic claudication 11/09/2020   CKD stage 3 due to type 2 diabetes mellitus (HCC) 12/14/2019   Asymptomatic bilateral carotid artery stenosis 12/14/2019   Abdominal distention    CAD s/p CABG in 2009 12/14/2018   Acute renal failure superimposed on stage 3 chronic kidney disease (HCC) 12/14/2018   UTI (urinary tract infection) 12/14/2018   HLD (hyperlipidemia) 12/14/2018   Sepsis (HCC) 12/14/2018   Abnormal LFTs    S/P CABG (coronary artery bypass graft) 12/06/2018   Asymptomatic stenosis of right carotid artery 12/06/2018   Claudication in peripheral vascular disease (HCC) 12/06/2018   Orthostatic hypotension due to diabetic dysautonomia 12/06/2018   Elevated liver enzymes 12/06/2018   Abdominal pain 10/12/2018   Abnormal liver function tests 10/12/2018   Foreign body in stomach 10/12/2018   Primary hypertension    GERD (gastroesophageal reflux disease)    Diabetes mellitus without complication (HCC)    Atherosclerosis of native coronary artery of native heart without angina pectoris     PCP: Carolyn Cisco, NP   REFERRING PROVIDER: Timothy Ford, MD  ONSET DATE: 09/14/2023 Prosthesis delivery  REFERRING DIAG: Y86.578 (ICD-10-CM) - Left below-knee amputee   THERAPY DIAG:  Other abnormalities of gait and mobility  Unsteadiness on  feet  Dizziness and giddiness  Muscle weakness (generalized)  Impaired functional mobility, balance, gait, and endurance  Rationale for Evaluation and Treatment: Rehabilitation  SUBJECTIVE:   SUBJECTIVE STATEMENT: He feels that exercise groups help and enable him to get to all types at least 2x/wk.  No falls.    PERTINENT HISTORY: Left TTA 06/24/23, DM2, retinopathy, GERD, HLD, HTN, CAD, CABG 2009, PAD, prostate CA, CKD stage 3b, arthritis, Laminectomy & Foraminotomy bilateral L4-5,   PAIN:  Are you having pain? No, 0/10 pain.  PRECAUTIONS: Fall  WEIGHT BEARING RESTRICTIONS: No  FALLS: Has patient fallen in last 6 months? Yes. Number of falls 1 no injuries  LIVING ENVIRONMENT: Lives with: lives with their family Lives in: House Home Access: Ramped entrance and single step Home layout: Two level, 1/2 bath on main level, and bedroom Stairs: Yes: Internal: 14 steps; on left going up and can reach both and External:  1+1 steps; none Has following equipment at home: Quad cane small base, Environmental consultant - 2 wheeled, Wheelchair (manual), shower chair, Shower bench, and bed side commode  OCCUPATION: retired from [employer]  PLOF: Independent used cane & walker   PATIENT GOALS: to use prosthesis to walk in home & community, family wants to be safe,  go upstairs to shower  OBJECTIVE:   POSTURE: Evaluation on 09/16/2023:  rounded shoulders, forward head, flexed trunk , and weight shift right  LOWER EXTREMITY ROM:  ROM P:passive  A:active Right 09/24/23 Left 09/24/23  Hip flexion    Hip extension Justin [name] -5* Thomas -11*  Hip abduction    Hip adduction    Hip internal rotation    Hip external rotation    Knee flexion    Knee extension  Supine P: -7*  Ankle dorsiflexion    Ankle plantarflexion    Ankle inversion    Ankle eversion     (Blank rows = not tested)  LOWER EXTREMITY MMT:  MMT Left 09/24/23  Hip flexion   Hip extension Sidelying 3-/5  Hip abduction  Sidleying 3/5  Hip adduction   Hip internal rotation   Hip external rotation   Knee flexion   Knee extension 3-/5  Ankle dorsiflexion   Ankle plantarflexion   Ankle inversion   Ankle eversion   At Evaluation all strength testing is grossly seated and functionally standing / gait. (Blank rows = not tested)  TRANSFERS: Evaluation on 09/16/2023:  Sit to stand: SBA 20" w/c with armrest to RW limited use of LLE Stand to sit: SBA RW to 20" w/c with armrest limited use of LLE  FUNCTIONAL TESTs:  01/27/2024: TUG: no device standard 17.22 sec;  with cane std 18.18 sec 4-square step test:  (cane) trial 1 30.22 sec with minA (one near fall with minA to prevent fall)   Trial 2 - 22.72 sec with min/CGA no near falls.   12/28/2203:  TUG: no device standard 18.28 sec;  with cane std 19.68 sec  12/08/2023: Berg Balance 39/56 TUG: no device standard 29.28 sec;  with cane std 25.68 sec and cognitive 39.75 sec naming states (stopped naming during turns & repeated 2 states) 4-square step test with cane:  30.68   cane & TTA prosthesis, modA for backward step & minA all other directions  32.62   cane & TTA prosthesis, modA for backward step & minA all other directions    11/09/2023 Berg: 35/56   TUG trial 1: 38sec 2 stumbles with pt able to correct without therapist intervention, trial 2: 34sec no stumbles, MinA to CGA  09/24/2023:  Patient able to stand up from 20" w/c with armrest with RW for stabilization with supervision.  Patient able to maintain upright with RW support without assistance.  Patient able to balance without upper extremity support with minA for 60 seconds.  Deferred at evaluation on 09/16/2023 as limb too edematous to don prosthesis  GAIT: 12/28/2023:   Gait velocity:  with TTA prosthesis & cane stand alone tip comfortable /self-selected 2.01 ft/sec with SBA and fast pace 2.51 ft/sec with SBA Pt amb 150' with cane scanning environment was able to maintain path with slight  decrease in speed.  Previously he either weaved from path or completely stopped to look.    12/10/2023: 6 Minute Walk Test: with cane 740' with supervision.    Resting HR 73 SpO2 98%  after gait HR 119  SpO2 99%  Modified Borg scale for dyspnea: 3: moderate shortness  of breath or breathing difficulty -pt neg ramp & curb with cane with minA for balance.  He neg ramp & curb with RW safely.  -pt neg 11 steps with single rail & cane with supervision with step-to pattern.   12/08/2023: Functional Gait Assessment with cane: 5/30 indicating high fall risk.  Gait velocity:  with TTA prosthesis & cane stand alone tip comfortable /self-selected 1.66 ft/sec with SBA and fast pace 2.26 ft/sec with minA.   11/17/2023: Ambulated with stand alone cane CGA with bouts of SBA, step through pattern with inconsistent gait cadence. Was also able to walk short distances without AD and CGA with vc to slow gait down for less instances of tripping.  11/09/2023:  Self selected gait speed: 1.46 ft/sec with stand alone cane, MinA  09/24/2023:   Patient ambulated 50 feet with RW with CGA.  Initially patient walking with a step to pattern with minimal weight shift onto prosthesis.   Deferred at evaluation on 09/16/2023 as limb too edematous to don prosthesis Gait pattern:  Distance walked:  Assistive device utilized:  Level of assistance:  Comments:   PROSTHETIC WEAR ASSESSMENT: 12/08/2023: Pt tolerates wear >90% of awake hours with no skin issues (larger wound that he initially had on tibial crest is completely healed) and reports no residual limb pain.  Pt & dtr verbalize proper prosthetic care with skin check, residual limb care, care of non-amputated limb, prosthetic cleaning, ply sock cleaning, correct ply sock adjustment, proper wear schedule/adjustment, and proper weight-bearing schedule/adjustment.  He requires cues to problem solving new prosthetic issues.    11/11/2023: Patient is wearing prosthesis all awake  hours and checking / drying or switching Vivewear under liner every 5 hours.  Wound has one superficial scab 2mm & 3mm at distal portion. Remainder is healed.     Evaluation on 09/16/2023:  Patient is dependent with: skin check, residual limb care, care of non-amputated limb, prosthetic cleaning, ply sock cleaning, correct ply sock adjustment, proper wear schedule/adjustment, and proper weight-bearing schedule/adjustment Donning prosthesis: Max A Doffing prosthesis: Min A Prosthetic wear tolerance: 30-45 min 2 days ago when prosthesis delivered.  Prosthetic weight bearing tolerance: 3 minutes partial weight bearing trying to engage pin suspension.  Pt reports distal lateral limb pain from wear 2 days ago.  Edema: pitting edema  Residual limb condition: scab dry over tibial crest 5cm wide X 15 cm long.  Dry skin, normal color & temperature. Cylindrical shape Prosthetic description: silicon liner with anterior portion 9mm thick, secondary pelite foam liner, total contact socket,     TODAY'S TREATMENT:                                                                                                                             DATE:  02/09/2024: Prosthetic training with transtibial prosthesis: Discussed socket revision with recommendation for discussion with prosthetist if suspension should continue to be pin lock or switch.  Pt & dtr verbalized understanding.  Neuromuscular  Re-education: Stand with back to corner with chair back in front of you for safety. Do 5-10 head motions 4 directions - right/left, up/down, diagonal up-right/down-left and other diagonal up-left/down-right -Position 1: stand on floor with eyes open feet together - perform head motions -Position 2: stand on floor with eyes closed feet about 2-3" apart - perform head motions -Position 3: stand on foam with eyes open feet about 4" apart - perform head motions -static stance on foam eyes closed with goal 10 sec - 1st rep 6 sec,  2nd rep 10 sec, 3rd rep 7 sec -PT working on step strategy by standing on towel roll and stepping off trying to stabilize without having to touch external support with each lower extremity -forward, backward, to left and to the right.  Patient improved step strategy significantly with right lower extremity step and slightly with left prosthetic step.  PT updated HEP including HO (see pt below)  to include above as corner group to be performed 2-3 times per week. Pt & dtr verbalized understanding after performing in clinic.    TREATMENT:                                                                                                                             DATE:  01/27/2024: Prosthetic training with transtibial prosthesis: See objective data for tug and 4 square testing.  Neuromuscular Re-education: Stand with back to corner with chair back in front of you for safety. Do 5-10 head motions 4 directions - right/left, up/down, diagonal up-right/down-left and other diagonal up-left/down-right  Position 1: stand on floor with eyes open feet together - perform head motions  Position 2: stand on floor with eyes closed feet about 2-3" apart - perform head motions  Position 3: stand on foam with eyes open feet about 4" apart - perform head motions   Added above to HEP with HO. After performing in clinic, pt verbalized understanding.  PT working on step strategy by standing on towel roll and stepping off trying to stabilize without having to touch external support with each lower extremity -forward, backward, to left and to the right.  Patient improved step strategy significantly with right lower extremity step and slightly with left prosthetic step.      TREATMENT:  DATE:  01/18/2024:  Neuromuscular Re-education: Balance activities to facilitate ankle / residual limb & hip  strategies: standing on floor with feet 4" apart in corner with chair back in front.   Verbal & tactile cues for equal weight bearing.   head motions right/left, up/down and both diagonals. Eyes open with supervision & eyes closed required minA for stability.  Anticipatory Step strategy stepping off towel roll with cane support & stabilizing without touching //bars. Initial 3 reps ea direction releasing support after he steps while stabilizing, then 5 reps releasing support prior to stepping:  Forward, backward and side step.  Pt required minA from PT or touch on walls / chair back.  Attempted standing on Egger disc but unable to manage balance enough was beneficial.  Braiding with BUE on //bar 3 laps.      PATIENT EDUCATION: PATIENT EDUCATED ON FOLLOWING PROSTHETIC CARE: Education details:   Skin check, Residual limb care, Prosthetic cleaning, Correct ply sock adjustment, Propper donning, and Proper wear schedule/adjustment Prosthetic wear tolerance: liner only initially 2 hours 3x/day, 7 days/week Person educated: Patient and Child(ren) Education method: Explanation, Demonstration, Tactile cues, and Verbal cues Education comprehension: dtr & granddaughter verbalized understanding, verbal cues required, tactile cues required, and needs further education  HOME EXERCISE PROGRAM: Access Code: [email or web address] URL: [email or web address] Date: 01/11/2024 Prepared by: Lorie Rook  Exercises - walking balance with band behind you  - 1 x daily - 3 x weekly - 1 sets - 10 reps - 5 seconds hold - walking balance with band in front of you  - 1 x daily - 3 x weekly - 1 sets - 10 reps - 5 seconds hold - walking balance with band to your right  - 1 x daily - 3 x weekly - 1 sets - 10 reps - 5 seconds hold - walking balance with band to your left  - 1 x daily - 3 x weekly - 1 sets - 10 reps - 5 seconds hold - 4 square stepping with back side to counter  - 1 x daily - 3 x weekly - 1 sets - 3  reps - Walk with Head Turns  - 1 x daily - 3 x weekly - 1 sets - 10 reps - Backwards Walking  - 1 x daily - 3 x weekly - 1 sets - 10 reps - Sideways Walking  - 1 x daily - 3 x weekly - 1 sets - 10 reps - Carioca with Counter Support  - 1 x daily - 3 x weekly - 1 sets - 10 reps - Walking with Eyes Closed  - 1 x daily - 3 x weekly - 5 sets - 3 reps - Fast Walking  - 1 x daily - 3 x weekly - 5 sets - 3 reps - stair climbing using alternating pattern with 2 rails  - 1 x daily - 3 x weekly - 1 sets - 10 reps - Seated Hamstring Stretch with Strap  - 1 x daily - 3-5 x weekly - 1 sets - 3 reps - 20-30 seconds hold - Standing Gastroc Stretch on Step  - 1 x daily - 3-5 x weekly - 1 sets - 3 reps - 20-30 seconds hold - Upright Stance at Door Frame Single Arm  - 1-3 x daily - 7 x weekly - 1 sets - 2 reps - 2 deep breathes hold - Upright Stance at Door Frame with Both Arms  - 1-3 x daily - 7 x weekly -  1 sets - 2 reps - 2 deep breathes hold  Stand with back to corner with chair back in front of you for safety. Do 5-10 head motions 4 directions - right/left, up/down, diagonal up-right/down-left and other diagonal up-left/down-right -Position 1: stand on floor with eyes open feet together - perform head motions -Position 2: stand on floor with eyes closed feet about 2-3" apart - perform head motions -Position 3: stand on foam with eyes open feet about 4" apart - perform head motion -stand on foam with feet ~4" apart - goal is to stand still closing your eyes for 10 seconds.  3-5 reps.  If you loose your balance, try to reestablish your balance before closing your eyes.  -step strategy work - roll up towel so when you stand on it your toes & heels are off the floor. You are going to step off the towel roll and try to catch your balance with one step.  If needed you can 1st step other foot and 2nd catch wall or chair back. Repeat each direction 5 reps with each foot. Hold onto chair back or wall to get back onto  towel roll then let go prior to stepping.   *first place towel close to corner and step forward towards chair.   *move towel close to chair and step backwards towards the corner  *move towel side ways close to chair, stand on towel with left side to chair & right foot close to edge of towel, step right foot towards the corner.   *stand on towel with right side to chair & left foot close to edge of towel, step left foot towards the corner.   PT broke HEP into groups by location and need for supervision.  PT recommending to try to perform each group 2-3x/wk to enable variety of exercises that he needs without being overwhelmed. PT instructed with demo, verbal & HO cues.  Pt & dtr verbalized understanding after completing in PT.  Group 1 - resistance band - walking balance with band behind you  - 1 x daily - 3 x weekly - 1 sets - 10 reps - 5 seconds hold - walking balance with band in front of you  - 1 x daily - 3 x weekly - 1 sets - 10 reps - 5 seconds hold - walking balance with band to your right  - 1 x daily - 3 x weekly - 1 sets - 10 reps - 5 seconds hold - walking balance with band to your left  - 1 x daily - 3 x weekly - 1 sets - 10 reps - 5 seconds hold  Group 2 - near counter - 4 square stepping with back side to counter  - 1 x daily - 3 x weekly - 1 sets - 3 reps - Backwards Walking  - 1 x daily - 3 x weekly - 1 sets - 10 reps - Sideways Walking  - 1 x daily - 3 x weekly - 1 sets - 10 reps - Carioca with Counter Support  - 1 x daily - 3 x weekly - 1 sets - 10 reps  Group 3 - walking with family supervision (with cane & without device) - Walk with Head Turns  - 1 x daily - 3 x weekly - 1 sets - 10 reps - Walking with Eyes Closed  - 1 x daily - 3 x weekly - 5 sets - 3 reps - Fast Walking  - 1 x daily -  3 x weekly - 5 sets - 3 reps - stair climbing using alternating pattern with 2 rails  - 1 x daily - 3 x weekly - 1 sets - 10 reps  Group 4 - stretches - Seated Hamstring Stretch with  Strap  - 1 x daily - 3-5 x weekly - 1 sets - 3 reps - 20-30 seconds hold - Standing Gastroc Stretch on Step  - 1 x daily - 3-5 x weekly - 1 sets - 3 reps - 20-30 seconds hold - Upright Stance at Door Frame Single Arm (can use cane to assist UE motion) - 1-3 x daily - 7 x weekly - 1 sets - 2 reps - 2 deep breathes hold - Upright Stance at Door Frame with Both Arms (can use cane to assist UE motion)   - 1-3 x daily - 7 x weekly - 1 sets - 2 reps - 2 deep breathes hold  ASSESSMENT:  CLINICAL IMPRESSION: Patient appears to understand HEP to work on balance facilitating all strategies and is safe to perform in corner with chair back.   Patient will continue to benefit from continued skilled physical therapy to address deficits.    OBJECTIVE IMPAIRMENTS: Abnormal gait, decreased activity tolerance, decreased balance, decreased endurance, decreased knowledge of condition, decreased knowledge of use of DME, decreased mobility, difficulty walking, decreased ROM, decreased strength, increased edema, prosthetic dependency , and pain.   ACTIVITY LIMITATIONS: carrying, lifting, sitting, standing, stairs, transfers, and locomotion level  PARTICIPATION LIMITATIONS: standing ADLS, cleaning, community activity, and household activity  PERSONAL FACTORS: Age, Fitness, Past/current experiences, Time since onset of injury/illness/exacerbation, and 3+ comorbidities: see PMH are also affecting patient's functional outcome.   REHAB POTENTIAL: Good  CLINICAL DECISION MAKING: Evolving/moderate complexity  EVALUATION COMPLEXITY: Moderate   GOALS: Goals reviewed with patient? Yes  SHORT TERM GOALS: Target date: 01/11/2024  Patient verbalizes understanding of updated HEP. Baseline: SEE OBJECTIVE DATA Goal status: MET  12/28/2023 2.  Timed Up & Go with cane <22 sec Baseline: SEE OBJECTIVE DATA Goal status: MET  12/28/2023  3.  Patient ambulates 150' with cane scanning environment maintaining path.  Baseline:  SEE OBJECTIVE DATA Goal status: MET    12/28/2023    UPDATED LONG TERM GOALS: Target date: 03/09/2024  Patient & family demonstrates & verbalizes ongoing understanding of prosthetic care including problem solving issues & tolerates wear >90% of awake hours without skin or limb pain issues to enable safe utilization of prosthesis. Baseline: SEE OBJECTIVE DATA Goal status: Ongoing     02/09/2024  Timed Up & Go without Assistive device <30 sec safely. Baseline: SEE OBJECTIVE DATA Goal status: Ongoing    01/27/2024  Berg Balance >/= 45/56 to indicate lower fall risk with standing ADLs.  Baseline: SEE OBJECTIVE DATA Goal status:  Ongoing   02/09/2024  4.  Patient ambulates 53' with prosthesis only carrying plate & cup modified independent. Baseline: SEE OBJECTIVE DATA Goal status: Ongoing   02/09/2024  5. 6 Minute Walk Test >900' modified independent with prosthesis & cane. Baseline: SEE OBJECTIVE DATA Goal status:  Ongoing    02/09/2024  6.  Patient negotiates ramps, curbs & stairs with single rail with prosthesis & cane modified independent.  Baseline: SEE OBJECTIVE DATA Goal status:  Ongoing   02/09/2024  7.  Patient demonstrates improved balance with 4-Square Step Test with cane <25 sec safely  Baseline: SEE OBJECTIVE DATA Goal status:  Ongoing   02/09/2024    PLAN:  PT FREQUENCY: 1-2x/wk  PT  DURATION: 90 days  PLANNED INTERVENTIONS: 97164- PT Re-evaluation, 97110-Therapeutic exercises, 97530- Therapeutic activity, [id number]- Neuromuscular re-education, [phone]- Self Care, [id number]- Gait training, [phone]- Prosthetic training, Patient/Family education, Balance training, Stair training, DME instructions, and physical performance testing  PLAN FOR NEXT SESSION: follow every other week for 2 more visits.   Lorie Rook, PT, DPT 02/09/2024, 12:20 PM   Date of referral: 08/31/2023 Referring provider: Gearldean Keepers, MD Referring diagnosis? Q46.962 (ICD-10-CM) - Left below-knee amputee   Treatment diagnosis? (if different than referring diagnosis)  Other abnormalities of gait and mobility  ICD-10-CM: R26.89   Unsteadiness on feet  ICD-10-CM: R26.81   Muscle weakness (generalized)  ICD-10-CM: M62.81   Impaired functional mobility, balance, gait, and endurance  ICD-10-CM: Z74.09   What was this (referring dx) caused by? Surgery (Type: left BKA)  Nature of Condition: Initial Onset (within last 3 months) prosthesis delivery    Laterality: Lt  Current Functional Measure Score: 12/10/2023 Berg Balance 39/56;  11/09/2023 Berg Balance 35/56;  at evalation was standing balance supervision static stance 1 min.  TUG: no device standard 18.28 sec (on 12/08/23 was 29.28 sec);  with cane std 19.68 sec (on 12/08/23 was 25.68 sec) Gait velocity:  with TTA prosthesis & cane stand alone tip comfortable /self-selected 2.01 ft/sec with SBA and fast pace 2.51 ft/sec with SBA.  (On 12/08/2023 was 1.66 ft/sec with SBA and fast pace 2.26 ft/sec with minA.  01/27/2024: TUG: no device standard 17.22 sec;  with cane std 18.18 sec 4-square step test:  (cane) trial 1 30.22 sec with minA (one near fall with minA to prevent fall)   Trial 2 - 22.72 sec with min/CGA no near falls.   Objective measurements identify impairments when they are compared to normal values, the uninvolved extremity, and prior level of function.  [x]  Yes  []  No  Objective assessment of functional ability: Moderate functional limitations   Briefly describe symptoms: Eval was patient is dependent in prosthetic care & use.  He has impaired mobility. Patient is deconditioned from prolonged limited activity with wound, then amputation. 11/11/2023: patient is wearing prosthesis most of awake hours and wound is almost completely healed. Pt has general understanding of prosthetic care.  He is ambulating in home with cane and community with RW.  Previously was w/c bound.  12/10/2023: patient is functioning with family with cane in  community.  Functional Outcome Tests indicate high fall risk and he has potential to decrease risk with PT.   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 Constantly  How much have the symptoms interfered with usual daily activities? Extremely  How has condition changed since care began at this facility? Improved - Better  In general, how is the patients overall health? Good   BACK PAIN (STarT Back Screening Tool) No

## 2024-02-09 NOTE — Patient Instructions (Signed)
 Stand with back to corner with chair back in front of you for safety. Do 5-10 head motions 4 directions - right/left, up/down, diagonal up-right/down-left and other diagonal up-left/down-right -Position 1: stand on floor with eyes open feet together - perform head motions -Position 2: stand on floor with eyes closed feet about 2-3" apart - perform head motions -Position 3: stand on foam with eyes open feet about 4" apart - perform head motion -stand on foam with feet ~4" apart - goal is to stand still closing your eyes for 10 seconds.  3-5 reps.  If you loose your balance, try to reestablish your balance before closing your eyes.  -step strategy work - roll up towel so when you stand on it your toes & heels are off the floor. You are going to step off the towel roll and try to catch your balance with one step.  If needed you can 1st step other foot and 2nd catch wall or chair back. Repeat each direction 5 reps with each foot. Hold onto chair back or wall to get back onto towel roll then let go prior to stepping.   *first place towel close to corner and step forward towards chair.   *move towel close to chair and step backwards towards the corner  *move towel side ways close to chair, stand on towel with left side to chair & right foot close to edge of towel, step right foot towards the corner.   *stand on towel with right side to chair & left foot close to edge of towel, step left foot towards the corner.

## 2024-02-23 ENCOUNTER — Ambulatory Visit: Admitting: Physical Therapy

## 2024-02-23 ENCOUNTER — Encounter: Payer: Self-pay | Admitting: Physical Therapy

## 2024-02-23 ENCOUNTER — Encounter: Admitting: Physical Therapy

## 2024-02-23 DIAGNOSIS — R42 Dizziness and giddiness: Secondary | ICD-10-CM

## 2024-02-23 DIAGNOSIS — R2681 Unsteadiness on feet: Secondary | ICD-10-CM | POA: Diagnosis not present

## 2024-02-23 DIAGNOSIS — R2689 Other abnormalities of gait and mobility: Secondary | ICD-10-CM

## 2024-02-23 DIAGNOSIS — M6281 Muscle weakness (generalized): Secondary | ICD-10-CM | POA: Diagnosis not present

## 2024-02-23 DIAGNOSIS — Z7409 Other reduced mobility: Secondary | ICD-10-CM

## 2024-02-23 NOTE — Therapy (Signed)
 OUTPATIENT PHYSICAL THERAPY PROSTHETIC TREATMENT & DISCHARGE SUMMARY   Patient Name: Justin [name] MRN: [id number] DOB:12/01/1932, 89 y.o., male Today's Date: 02/23/2024  PHYSICAL THERAPY DISCHARGE SUMMARY  Visits from Start of Care: 25  Current functional level related to goals / functional outcomes: See below   Remaining deficits: See below   Education / Equipment: Pt was instructed in prosthetic care & HEP which appears to understand.    Patient agrees to discharge. Patient goals were partially met. Patient is being discharged due to meeting the stated rehab goals. & patient request due to wound on left heel.    END OF SESSION:  PT End of Session - 02/23/24 0757     Visit Number 25    Number of Visits 35    Date for PT Re-Evaluation 03/09/24    Authorization Type [employer]    Authorization Time Period $20 co-pay   5 PT VISITS 5/6-6/11    Authorization - Visit Number 2    Authorization - Number of Visits 5    Progress Note Due on Visit 26    PT Start Time 0757    PT Stop Time 0828    PT Time Calculation (min) 31 min    Equipment Utilized During Treatment Gait belt    Activity Tolerance Patient tolerated treatment well    Behavior During Therapy WFL for tasks assessed/performed              Past Medical History:  Diagnosis Date   Anxiety    Arthritis    Carotid arterial disease (HCC)    CKD (chronic kidney disease)    Coronary artery disease    Diabetes mellitus without complication (HCC)    Diabetic retinopathy (HCC)    NPDR OU   Diverticulitis    Dyspnea    GERD (gastroesophageal reflux disease)    History of kidney stones 2021   Hypercholesteremia    Hypertension    Hypertensive retinopathy    OU   Myocardial infarction [facility]) 2009   Pneumonia    as a child   Prostate cancer (HCC)    UTI (lower urinary tract infection)    Past Surgical History:  Procedure Laterality Date   ABDOMINAL AORTOGRAM W/LOWER EXTREMITY N/A 06/22/2023    Procedure: ABDOMINAL AORTOGRAM W/LOWER EXTREMITY;  Surgeon: Adine Hoof, MD;  Location: [facility] INVASIVE CV LAB;  Service: Cardiovascular;  Laterality: N/A;   ABDOMINAL SURGERY     for diverticulitis; also removed appendix   AMPUTATION Left 06/24/2023   Procedure: LEFT BELOW THE KNEE AMPUTATION;  Surgeon: Timothy Ford, MD;  Location: [facility] OR;  Service: Orthopedics;  Laterality: Left;   APPENDECTOMY     CARDIAC CATHETERIZATION  2018   CATARACT EXTRACTION     CATARACT EXTRACTION, BILATERAL     CHOLECYSTECTOMY N/A 10/12/2017   Procedure: LAPAROSCOPIC CHOLECYSTECTOMY;  Surgeon: Oza Blumenthal, MD;  Location: [facility] OR;  Service: General;  Laterality: N/A;   CORONARY ARTERY BYPASS GRAFT  2009   ERCP N/A 12/21/2018   Procedure: ENDOSCOPIC RETROGRADE CHOLANGIOPANCREATOGRAPHY (ERCP);  Surgeon: Alvis Jourdain, MD;  Location: [facility] ENDOSCOPY;  Service: Endoscopy;  Laterality: N/A;   ESOPHAGOGASTRODUODENOSCOPY (EGD) WITH PROPOFOL  N/A 12/17/2018   Procedure: ESOPHAGOGASTRODUODENOSCOPY (EGD) WITH PROPOFOL ;  Surgeon: Alvis Jourdain, MD;  Location: [facility] ENDOSCOPY;  Service: Endoscopy;  Laterality: N/A;   EUS Left 12/17/2018   Procedure: UPPER ENDOSCOPIC ULTRASOUND (EUS) LINEAR;  Surgeon: Alvis Jourdain, MD;  Location: [facility] ENDOSCOPY;  Service: Endoscopy;  Laterality: Left;   EYE  SURGERY     "for bleeding in eye"   HERNIA REPAIR     LEFT HEART CATH AND CORONARY ANGIOGRAPHY N/A 11/04/2016   Procedure: Left Heart Cath and Coronary Angiography;  Surgeon: Knox Perl, MD;  Location: [facility] INVASIVE CV LAB;  Service: Cardiovascular;  Laterality: N/A;   LOWER EXTREMITY ANGIOGRAPHY N/A 11/04/2016   Procedure: Lower Extremity Angiography;  Surgeon: Knox Perl, MD;  Location: [facility] INVASIVE CV LAB;  Service: Cardiovascular;  Laterality: N/A;   LOWER EXTREMITY ANGIOGRAPHY N/A 01/03/2020   Procedure: LOWER EXTREMITY ANGIOGRAPHY;  Surgeon: Knox Perl, MD;  Location: [facility] INVASIVE CV LAB;  Service: Cardiovascular;  Laterality: N/A;   LOWER  EXTREMITY ANGIOGRAPHY Bilateral 01/14/2022   Procedure: Lower Extremity Angiography;  Surgeon: Knox Perl, MD;  Location: [facility] INVASIVE CV LAB;  Service: Cardiovascular;  Laterality: Bilateral;   LUMBAR LAMINECTOMY/DECOMPRESSION MICRODISCECTOMY Bilateral 11/09/2020   Procedure: Laminectomy and Foraminotomy - bilateral - Lumbar Four-Five.;  Surgeon: Agustina Aldrich, MD;  Location: [facility] OR;  Service: Neurosurgery;  Laterality: Bilateral;  posterior   PROSTATE SURGERY     REMOVAL OF STONES  12/21/2018   Procedure: REMOVAL OF STONES;  Surgeon: Alvis Jourdain, MD;  Location: [facility] ENDOSCOPY;  Service: Endoscopy;;   SPHINCTEROTOMY  12/21/2018   Procedure: [address];  Surgeon: Alvis Jourdain, MD;  Location: [facility] ENDOSCOPY;  Service: Endoscopy;;   Patient Active Problem List   Diagnosis Date Noted   Osteomyelitis of great toe of left foot (HCC) 06/20/2023   Gangrene of toe of left foot (HCC) 06/19/2023   Normocytic anemia 06/19/2023   Eschar of left great toe 05/22/2023   Near syncope 06/15/2021   Dizziness 06/15/2021   Lumbar stenosis with neurogenic claudication 11/09/2020   CKD stage 3 due to type 2 diabetes mellitus (HCC) 12/14/2019   Asymptomatic bilateral carotid artery stenosis 12/14/2019   Abdominal distention    CAD s/p CABG in 2009 12/14/2018   Acute renal failure superimposed on stage 3 chronic kidney disease (HCC) 12/14/2018   UTI (urinary tract infection) 12/14/2018   HLD (hyperlipidemia) 12/14/2018   Sepsis (HCC) 12/14/2018   Abnormal LFTs    S/P CABG (coronary artery bypass graft) 12/06/2018   Asymptomatic stenosis of right carotid artery 12/06/2018   Claudication in peripheral vascular disease (HCC) 12/06/2018   Orthostatic hypotension due to diabetic dysautonomia 12/06/2018   Elevated liver enzymes 12/06/2018   Abdominal pain 10/12/2018   Abnormal liver function tests 10/12/2018   Foreign body in stomach 10/12/2018   Primary hypertension    GERD (gastroesophageal reflux disease)     Diabetes mellitus without complication (HCC)    Atherosclerosis of native coronary artery of native heart without angina pectoris     PCP: Carolyn Cisco, NP   REFERRING PROVIDER: Timothy Ford, MD  ONSET DATE: 09/14/2023 Prosthesis delivery  REFERRING DIAG: L24.401 (ICD-10-CM) - Left below-knee amputee   THERAPY DIAG:  Other abnormalities of gait and mobility  Unsteadiness on feet  Dizziness and giddiness  Muscle weakness (generalized)  Impaired functional mobility, balance, gait, and endurance  Rationale for Evaluation and Treatment: Rehabilitation  SUBJECTIVE:   SUBJECTIVE STATEMENT: He has a wound on lateral heel on left foot so he wants to end PT today. His heel hurts 5/10.      PERTINENT HISTORY: Left TTA 06/24/23, DM2, retinopathy, GERD, HLD, HTN, CAD, CABG 2009, PAD, prostate CA, CKD stage 3b, arthritis, Laminectomy & Foraminotomy bilateral L4-5,   PAIN:  Are you having pain? No, 0/10 pain.  PRECAUTIONS: Fall  WEIGHT BEARING  RESTRICTIONS: No  FALLS: Has patient fallen in last 6 months? Yes. Number of falls 1 no injuries  LIVING ENVIRONMENT: Lives with: lives with their family Lives in: House Home Access: Ramped entrance and single step Home layout: Two level, 1/2 bath on main level, and bedroom Stairs: Yes: Internal: 14 steps; on left going up and can reach both and External: 1+1 steps; none Has following equipment at home: Quad cane small base, Environmental consultant - 2 wheeled, Wheelchair (manual), shower chair, Shower bench, and bed side commode  OCCUPATION: retired from [employer]  PLOF: Independent used cane & walker   PATIENT GOALS: to use prosthesis to walk in home & community, family wants to be safe,  go upstairs to shower  OBJECTIVE:   POSTURE: Evaluation on 09/16/2023:  rounded shoulders, forward head, flexed trunk , and weight shift right  LOWER EXTREMITY ROM:  ROM P:passive  A:active Right 09/24/23 Left 09/24/23  Hip flexion    Hip  extension Andy Bannister -5* Thomas -11*  Hip abduction    Hip adduction    Hip internal rotation    Hip external rotation    Knee flexion    Knee extension  Supine P: -7*  Ankle dorsiflexion    Ankle plantarflexion    Ankle inversion    Ankle eversion     (Blank rows = not tested)  LOWER EXTREMITY MMT:  MMT Left 09/24/23  Hip flexion   Hip extension Sidelying 3-/5  Hip abduction Sidleying 3/5  Hip adduction   Hip internal rotation   Hip external rotation   Knee flexion   Knee extension 3-/5  Ankle dorsiflexion   Ankle plantarflexion   Ankle inversion   Ankle eversion   At Evaluation all strength testing is grossly seated and functionally standing / gait. (Blank rows = not tested)  TRANSFERS: Evaluation on 09/16/2023:  Sit to stand: SBA 20" w/c with armrest to RW limited use of LLE Stand to sit: SBA RW to 20" w/c with armrest limited use of LLE  FUNCTIONAL TESTs:  02/23/2024: Timed Up & Go:  with cane 23.87 sec;  without device 19.50 sec  [facility] PT Assessment - 02/23/24 0812       Standardized Balance Assessment   Standardized Balance Assessment Berg Balance Test;Timed Up and Go Test;Four Square Step Test      Berg Balance Test   Sit to Stand Able to stand  independently using hands    Standing Unsupported Able to stand safely 2 minutes    Sitting with Back Unsupported but Feet Supported on Floor or Stool Able to sit safely and securely 2 minutes    Stand to Sit Sits safely with minimal use of hands    Transfers Able to transfer safely, minor use of hands    Standing Unsupported with Eyes Closed Able to stand 10 seconds safely    Standing Unsupported with Feet Together Able to place feet together independently and stand for 1 minute with supervision    From Standing, Reach Forward with Outstretched Arm Can reach confidently >25 cm (10")    From Standing Position, Pick up Object from Floor Able to pick up shoe safely and easily    From Standing Position, Turn to Look  Behind Over each Shoulder Looks behind one side only/other side shows less weight shift    Turn 360 Degrees Able to turn 360 degrees safely but slowly    Standing Unsupported, Alternately Place Feet on Step/Stool Able to complete >2 steps/needs minimal assist  Standing Unsupported, One Foot in Front Able to plae foot ahead of the other independently and hold 30 seconds    Standing on One Leg Tries to lift leg/unable to hold 3 seconds but remains standing independently    Total Score 44    Berg comment: BERG  < 36 high risk for falls (close to 100%) 46-51 moderate (>50%)   37-45 significant (>80%) 52-55 lower (> 25%)      Timed Up and Go Test   Normal TUG (seconds) 29.28   23.68 sec with cane & TTA prosthesis,19.50 sec no device     Functional Gait  Assessment   Gait assessed  Yes    Gait Level Surface Walks 20 ft, slow speed, abnormal gait pattern, evidence for imbalance or deviates 10-15 in outside of the 12 in walkway width. Requires more than 7 sec to ambulate 20 ft.    Change in Gait Speed Makes only minor adjustments to walking speed, or accomplishes a change in speed with significant gait deviations, deviates 10-15 in outside the 12 in walkway width, or changes speed but loses balance but is able to recover and continue walking.    Gait with Horizontal Head Turns Performs head turns with moderate changes in gait velocity, slows down, deviates 10-15 in outside 12 in walkway width but recovers, can continue to walk.    Gait with Vertical Head Turns Performs task with moderate change in gait velocity, slows down, deviates 10-15 in outside 12 in walkway width but recovers, can continue to walk.    Gait and Pivot Turn Turns slowly, requires verbal cueing, or requires several small steps to catch balance following turn and stop    Step Over Obstacle Cannot perform without assistance.    Gait with Narrow Base of Support Ambulates less than 4 steps heel to toe or cannot perform without assistance.     Gait with Eyes Closed Cannot walk 20 ft without assistance, severe gait deviations or imbalance, deviates greater than 15 in outside 12 in walkway width or will not attempt task.    Ambulating Backwards Cannot walk 20 ft without assistance, severe gait deviations or imbalance, deviates greater than 15 in outside 12 in walkway width or will not attempt task.    Steps Two feet to a stair, must use rail.    Total Score 6    FGA comment: cane stand alone tip & TTA prosthesis             01/27/2024: TUG: no device standard 17.22 sec;  with cane std 18.18 sec 4-square step test:  (cane) trial 1 30.22 sec with minA (one near fall with minA to prevent fall)   Trial 2 - 22.72 sec with min/CGA no near falls.   12/28/2203:  TUG: no device standard 18.28 sec;  with cane std 19.68 sec  12/08/2023: Berg Balance 39/56 TUG: no device standard 29.28 sec;  with cane std 25.68 sec and cognitive 39.75 sec naming states (stopped naming during turns & repeated 2 states) 4-square step test with cane:  30.68   cane & TTA prosthesis, modA for backward step & minA all other directions  32.62   cane & TTA prosthesis, modA for backward step & minA all other directions   11/09/2023 Berg: 35/56   TUG trial 1: 38sec 2 stumbles with pt able to correct without therapist intervention, trial 2: 34sec no stumbles, MinA to CGA  09/24/2023:  Patient able to stand up from 20" w/c with armrest with RW  for stabilization with supervision.  Patient able to maintain upright with RW support without assistance.  Patient able to balance without upper extremity support with minA for 60 seconds.  Deferred at evaluation on 09/16/2023 as limb too edematous to don prosthesis  GAIT: 02/23/2024: Pt amb 100' without device carrying household items modified independent.  Pt amb 300' with cane & prosthesis modified independent.  Pain in RLE due to wound increased so PT terminated testing.  Pt does report walking longer distances prior  to wound. Pt reports no issues negotiating ramps, curbs & stairs with a rail with his cane.   12/28/2023:   Gait velocity:  with TTA prosthesis & cane stand alone tip comfortable /self-selected 2.01 ft/sec with SBA and fast pace 2.51 ft/sec with SBA Pt amb 150' with cane scanning environment was able to maintain path with slight decrease in speed.  Previously he either weaved from path or completely stopped to look.    12/10/2023: 6 Minute Walk Test: with cane 740' with supervision.    Resting HR 73 SpO2 98%  after gait HR 119  SpO2 99%  Modified Borg scale for dyspnea: 3: moderate shortness of breath or breathing difficulty -pt neg ramp & curb with cane with minA for balance.  He neg ramp & curb with RW safely.  -pt neg 11 steps with single rail & cane with supervision with step-to pattern.   12/08/2023: Functional Gait Assessment with cane: 5/30 indicating high fall risk.  Gait velocity:  with TTA prosthesis & cane stand alone tip comfortable /self-selected 1.66 ft/sec with SBA and fast pace 2.26 ft/sec with minA.   11/17/2023: Ambulated with stand alone cane CGA with bouts of SBA, step through pattern with inconsistent gait cadence. Was also able to walk short distances without AD and CGA with vc to slow gait down for less instances of tripping.  11/09/2023:  Self selected gait speed: 1.46 ft/sec with stand alone cane, MinA  09/24/2023:   Patient ambulated 50 feet with RW with CGA.  Initially patient walking with a step to pattern with minimal weight shift onto prosthesis.   Deferred at evaluation on 09/16/2023 as limb too edematous to don prosthesis Gait pattern:  Distance walked:  Assistive device utilized:  Level of assistance:  Comments:   PROSTHETIC WEAR ASSESSMENT: 12/08/2023: Pt tolerates wear >90% of awake hours with no skin issues (larger wound that he initially had on tibial crest is completely healed) and reports no residual limb pain.  Pt & dtr verbalize proper prosthetic  care with skin check, residual limb care, care of non-amputated limb, prosthetic cleaning, ply sock cleaning, correct ply sock adjustment, proper wear schedule/adjustment, and proper weight-bearing schedule/adjustment.  He requires cues to problem solving new prosthetic issues.    11/11/2023: Patient is wearing prosthesis all awake hours and checking / drying or switching Vivewear under liner every 5 hours.  Wound has one superficial scab 2mm & 3mm at distal portion. Remainder is healed.     Evaluation on 09/16/2023:  Patient is dependent with: skin check, residual limb care, care of non-amputated limb, prosthetic cleaning, ply sock cleaning, correct ply sock adjustment, proper wear schedule/adjustment, and proper weight-bearing schedule/adjustment Donning prosthesis: Max A Doffing prosthesis: Min A Prosthetic wear tolerance: 30-45 min 2 days ago when prosthesis delivered.  Prosthetic weight bearing tolerance: 3 minutes partial weight bearing trying to engage pin suspension.  Pt reports distal lateral limb pain from wear 2 days ago.  Edema: pitting edema  Residual limb condition: scab dry  over tibial crest 5cm wide X 15 cm long.  Dry skin, normal color & temperature. Cylindrical shape Prosthetic description: silicon liner with anterior portion 9mm thick, secondary pelite foam liner, total contact socket,     TODAY'S TREATMENT:                                                                                                                             DATE:  02/23/2024: Prosthetic training with transtibial prosthesis: See objective data PT recommended continuing to wear prosthesis all awake hours.  He should follow doctor's recommendations for limiting weight bearing activities on RLE.  If he has prosthesis on limb, then transfers and trips to bathroom will limit weight on RLE.  Without prosthesis he has high stress on RLE with necessary mobility.  Pt & dtr verbalized understanding.  PT  recommended Vivewear compression sock low compression for RLE.  His calf & ankle indicates size M.  Pt & dtr verbalized understanding.    TREATMENT:                                                                                                                             DATE:  02/09/2024: Prosthetic training with transtibial prosthesis: Discussed socket revision with recommendation for discussion with prosthetist if suspension should continue to be pin lock or switch.  Pt & dtr verbalized understanding.  Neuromuscular Re-education: Stand with back to corner with chair back in front of you for safety. Do 5-10 head motions 4 directions - right/left, up/down, diagonal up-right/down-left and other diagonal up-left/down-right -Position 1: stand on floor with eyes open feet together - perform head motions -Position 2: stand on floor with eyes closed feet about 2-3" apart - perform head motions -Position 3: stand on foam with eyes open feet about 4" apart - perform head motions -static stance on foam eyes closed with goal 10 sec - 1st rep 6 sec, 2nd rep 10 sec, 3rd rep 7 sec -PT working on step strategy by standing on towel roll and stepping off trying to stabilize without having to touch external support with each lower extremity -forward, backward, to left and to the right.  Patient improved step strategy significantly with right lower extremity step and slightly with left prosthetic step.  PT updated HEP including HO (see pt below)  to include above as corner group to be performed 2-3 times per week. Pt & dtr verbalized understanding after  performing in clinic.    TREATMENT:                                                                                                                             DATE:  01/27/2024: Prosthetic training with transtibial prosthesis: See objective data for tug and 4 square testing.  Neuromuscular Re-education: Stand with back to corner with chair back in front of  you for safety. Do 5-10 head motions 4 directions - right/left, up/down, diagonal up-right/down-left and other diagonal up-left/down-right  Position 1: stand on floor with eyes open feet together - perform head motions  Position 2: stand on floor with eyes closed feet about 2-3" apart - perform head motions  Position 3: stand on foam with eyes open feet about 4" apart - perform head motions   Added above to HEP with HO. After performing in clinic, pt verbalized understanding.  PT working on step strategy by standing on towel roll and stepping off trying to stabilize without having to touch external support with each lower extremity -forward, backward, to left and to the right.  Patient improved step strategy significantly with right lower extremity step and slightly with left prosthetic step.     PATIENT EDUCATION: PATIENT EDUCATED ON FOLLOWING PROSTHETIC CARE: Education details:   Skin check, Residual limb care, Prosthetic cleaning, Correct ply sock adjustment, Propper donning, and Proper wear schedule/adjustment Prosthetic wear tolerance: liner only initially 2 hours 3x/day, 7 days/week Person educated: Patient and Child(ren) Education method: Explanation, Demonstration, Tactile cues, and Verbal cues Education comprehension: dtr & granddaughter verbalized understanding, verbal cues required, tactile cues required, and needs further education  HOME EXERCISE PROGRAM: Access Code: [email or web address] URL: [email or web address] Date: 01/11/2024 Prepared by: Lorie Rook  Exercises - walking balance with band behind you  - 1 x daily - 3 x weekly - 1 sets - 10 reps - 5 seconds hold - walking balance with band in front of you  - 1 x daily - 3 x weekly - 1 sets - 10 reps - 5 seconds hold - walking balance with band to your right  - 1 x daily - 3 x weekly - 1 sets - 10 reps - 5 seconds hold - walking balance with band to your left  - 1 x daily - 3 x weekly - 1 sets - 10 reps - 5 seconds  hold - 4 square stepping with back side to counter  - 1 x daily - 3 x weekly - 1 sets - 3 reps - Walk with Head Turns  - 1 x daily - 3 x weekly - 1 sets - 10 reps - Backwards Walking  - 1 x daily - 3 x weekly - 1 sets - 10 reps - Sideways Walking  - 1 x daily - 3 x weekly - 1 sets - 10 reps - Carioca with Counter Support  - 1 x daily - 3 x weekly - 1 sets - 10 reps - Walking  with Eyes Closed  - 1 x daily - 3 x weekly - 5 sets - 3 reps - Fast Walking  - 1 x daily - 3 x weekly - 5 sets - 3 reps - stair climbing using alternating pattern with 2 rails  - 1 x daily - 3 x weekly - 1 sets - 10 reps - Seated Hamstring Stretch with Strap  - 1 x daily - 3-5 x weekly - 1 sets - 3 reps - 20-30 seconds hold - Standing Gastroc Stretch on Step  - 1 x daily - 3-5 x weekly - 1 sets - 3 reps - 20-30 seconds hold - Upright Stance at Door Frame Single Arm  - 1-3 x daily - 7 x weekly - 1 sets - 2 reps - 2 deep breathes hold - Upright Stance at Door Frame with Both Arms  - 1-3 x daily - 7 x weekly - 1 sets - 2 reps - 2 deep breathes hold  Stand with back to corner with chair back in front of you for safety. Do 5-10 head motions 4 directions - right/left, up/down, diagonal up-right/down-left and other diagonal up-left/down-right -Position 1: stand on floor with eyes open feet together - perform head motions -Position 2: stand on floor with eyes closed feet about 2-3" apart - perform head motions -Position 3: stand on foam with eyes open feet about 4" apart - perform head motion -stand on foam with feet ~4" apart - goal is to stand still closing your eyes for 10 seconds.  3-5 reps.  If you loose your balance, try to reestablish your balance before closing your eyes.  -step strategy work - roll up towel so when you stand on it your toes & heels are off the floor. You are going to step off the towel roll and try to catch your balance with one step.  If needed you can 1st step other foot and 2nd catch wall or chair back.  Repeat each direction 5 reps with each foot. Hold onto chair back or wall to get back onto towel roll then let go prior to stepping.   *first place towel close to corner and step forward towards chair.   *move towel close to chair and step backwards towards the corner  *move towel side ways close to chair, stand on towel with left side to chair & right foot close to edge of towel, step right foot towards the corner.   *stand on towel with right side to chair & left foot close to edge of towel, step left foot towards the corner.   PT broke HEP into groups by location and need for supervision.  PT recommending to try to perform each group 2-3x/wk to enable variety of exercises that he needs without being overwhelmed. PT instructed with demo, verbal & HO cues.  Pt & dtr verbalized understanding after completing in PT.  Group 1 - resistance band - walking balance with band behind you  - 1 x daily - 3 x weekly - 1 sets - 10 reps - 5 seconds hold - walking balance with band in front of you  - 1 x daily - 3 x weekly - 1 sets - 10 reps - 5 seconds hold - walking balance with band to your right  - 1 x daily - 3 x weekly - 1 sets - 10 reps - 5 seconds hold - walking balance with band to your left  - 1 x daily - 3 x weekly -  1 sets - 10 reps - 5 seconds hold  Group 2 - near counter - 4 square stepping with back side to counter  - 1 x daily - 3 x weekly - 1 sets - 3 reps - Backwards Walking  - 1 x daily - 3 x weekly - 1 sets - 10 reps - Sideways Walking  - 1 x daily - 3 x weekly - 1 sets - 10 reps - Carioca with Counter Support  - 1 x daily - 3 x weekly - 1 sets - 10 reps  Group 3 - walking with family supervision (with cane & without device) - Walk with Head Turns  - 1 x daily - 3 x weekly - 1 sets - 10 reps - Walking with Eyes Closed  - 1 x daily - 3 x weekly - 5 sets - 3 reps - Fast Walking  - 1 x daily - 3 x weekly - 5 sets - 3 reps - stair climbing using alternating pattern with 2 rails  - 1 x daily  - 3 x weekly - 1 sets - 10 reps  Group 4 - stretches - Seated Hamstring Stretch with Strap  - 1 x daily - 3-5 x weekly - 1 sets - 3 reps - 20-30 seconds hold - Standing Gastroc Stretch on Step  - 1 x daily - 3-5 x weekly - 1 sets - 3 reps - 20-30 seconds hold - Upright Stance at Door Frame Single Arm (can use cane to assist UE motion) - 1-3 x daily - 7 x weekly - 1 sets - 2 reps - 2 deep breathes hold - Upright Stance at Door Frame with Both Arms (can use cane to assist UE motion)   - 1-3 x daily - 7 x weekly - 1 sets - 2 reps - 2 deep breathes hold  ASSESSMENT:  CLINICAL IMPRESSION: Patient made significant progress with his prosthetic wear & use.  He has lowered his fall risk as noted by improved Functional Assessments but continues to fall in high risk category.    OBJECTIVE IMPAIRMENTS: Abnormal gait, decreased activity tolerance, decreased balance, decreased endurance, decreased knowledge of condition, decreased knowledge of use of DME, decreased mobility, difficulty walking, decreased ROM, decreased strength, increased edema, prosthetic dependency , and pain.   ACTIVITY LIMITATIONS: carrying, lifting, sitting, standing, stairs, transfers, and locomotion level  PARTICIPATION LIMITATIONS: standing ADLS, cleaning, community activity, and household activity  PERSONAL FACTORS: Age, Fitness, Past/current experiences, Time since onset of injury/illness/exacerbation, and 3+ comorbidities: see PMH are also affecting patient's functional outcome.   REHAB POTENTIAL: Good  CLINICAL DECISION MAKING: Evolving/moderate complexity  EVALUATION COMPLEXITY: Moderate   GOALS: Goals reviewed with patient? Yes  SHORT TERM GOALS: Target date: 01/11/2024  Patient verbalizes understanding of updated HEP. Baseline: SEE OBJECTIVE DATA Goal status: MET  12/28/2023 2.  Timed Up & Go with cane <22 sec Baseline: SEE OBJECTIVE DATA Goal status: MET  12/28/2023  3.  Patient ambulates 150' with cane  scanning environment maintaining path.  Baseline: SEE OBJECTIVE DATA Goal status: MET    12/28/2023    UPDATED LONG TERM GOALS: Target date: 03/09/2024  Patient & family demonstrates & verbalizes ongoing understanding of prosthetic care including problem solving issues & tolerates wear >90% of awake hours without skin or limb pain issues to enable safe utilization of prosthesis. Baseline: SEE OBJECTIVE DATA Goal status:  MET   02/23/2024  Timed Up & Go without Assistive device <30 sec safely. Baseline: SEE OBJECTIVE DATA  Goal status: MET   02/23/2024  Berg Balance >/= 45/56 to indicate lower fall risk with standing ADLs.  Baseline: SEE OBJECTIVE DATA 02/23/2024  improved to 44/56 Goal status:  Partially MET  02/23/2024  4.  Patient ambulates 65' with prosthesis only carrying plate & cup modified independent. Baseline: SEE OBJECTIVE DATA Goal status: MET 02/23/2024  5. 6 Minute Walk Test >900' modified independent with prosthesis & cane. Baseline: SEE OBJECTIVE DATA Goal status:  NOT retested at discharge due to wound on RLE   02/23/2024  6.  Patient negotiates ramps, curbs & stairs with single rail with prosthesis & cane modified independent.  Baseline: SEE OBJECTIVE DATA Goal status:  verbalized MET   02/23/2024  7.  Patient demonstrates improved balance with 4-Square Step Test with cane <25 sec safely  Baseline: SEE OBJECTIVE DATA Goal status:  NOT retested at discharge due to wound on RLE    02/23/2024    PLAN:  PT FREQUENCY: 1-2x/wk  PT DURATION: 90 days  PLANNED INTERVENTIONS: 97164- PT Re-evaluation, 97110-Therapeutic exercises, 97530- Therapeutic activity, 97112- Neuromuscular re-education, [phone]- Self Care, 44010- Gait training, [phone]- Prosthetic training, Patient/Family education, Balance training, Stair training, DME instructions, and physical performance testing  PLAN FOR NEXT SESSION: Discharge PT due to wound on right foot and patient request.    Lorie Rook, PT,  DPT 02/23/2024, 8:42 AM   Date of referral: 08/31/2023 Referring provider: Gearldean Keepers, MD Referring diagnosis? G64.403 (ICD-10-CM) - Left below-knee amputee  Treatment diagnosis? (if different than referring diagnosis)  Other abnormalities of gait and mobility  ICD-10-CM: R26.89   Unsteadiness on feet  ICD-10-CM: R26.81   Muscle weakness (generalized)  ICD-10-CM: M62.81   Impaired functional mobility, balance, gait, and endurance  ICD-10-CM: Z74.09   What was this (referring dx) caused by? Surgery (Type: left BKA)  Nature of Condition: Initial Onset (within last 3 months) prosthesis delivery    Laterality: Lt  Current Functional Measure Score: 12/10/2023 Berg Balance 39/56;  11/09/2023 Berg Balance 35/56;  at evalation was standing balance supervision static stance 1 min.  TUG: no device standard 18.28 sec (on 12/08/23 was 29.28 sec);  with cane std 19.68 sec (on 12/08/23 was 25.68 sec) Gait velocity:  with TTA prosthesis & cane stand alone tip comfortable /self-selected 2.01 ft/sec with SBA and fast pace 2.51 ft/sec with SBA.  (On 12/08/2023 was 1.66 ft/sec with SBA and fast pace 2.26 ft/sec with minA.  01/27/2024: TUG: no device standard 17.22 sec;  with cane std 18.18 sec 4-square step test:  (cane) trial 1 30.22 sec with minA (one near fall with minA to prevent fall)   Trial 2 - 22.72 sec with min/CGA no near falls.   Objective measurements identify impairments when they are compared to normal values, the uninvolved extremity, and prior level of function.  [x]  Yes  []  No  Objective assessment of functional ability: Moderate functional limitations   Briefly describe symptoms: Eval was patient is dependent in prosthetic care & use.  He has impaired mobility. Patient is deconditioned from prolonged limited activity with wound, then amputation. 11/11/2023: patient is wearing prosthesis most of awake hours and wound is almost completely healed. Pt has general understanding of  prosthetic care.  He is ambulating in home with cane and community with RW.  Previously was w/c bound.  12/10/2023: patient is functioning with family with cane in community.  Functional Outcome Tests indicate high fall risk and he has potential to decrease risk with PT.   12/28/2023 see  above  How did symptoms start: infected wound led to amputation  Average pain intensity:  Last 24 hours: 0  Past week: 0  How often does the pt experience symptoms? Constantly  How much have the symptoms interfered with usual daily activities? Extremely  How has condition changed since care began at this facility? Improved - Better  In general, how is the patients overall health? Good   BACK PAIN (STarT Back Screening Tool) No

## 2024-03-03 ENCOUNTER — Encounter (HOSPITAL_BASED_OUTPATIENT_CLINIC_OR_DEPARTMENT_OTHER): Attending: Internal Medicine | Admitting: Internal Medicine

## 2024-03-03 DIAGNOSIS — E1142 Type 2 diabetes mellitus with diabetic polyneuropathy: Secondary | ICD-10-CM | POA: Diagnosis not present

## 2024-03-03 DIAGNOSIS — L97418 Non-pressure chronic ulcer of right heel and midfoot with other specified severity: Secondary | ICD-10-CM | POA: Diagnosis not present

## 2024-03-03 DIAGNOSIS — E11621 Type 2 diabetes mellitus with foot ulcer: Secondary | ICD-10-CM | POA: Insufficient documentation

## 2024-03-03 DIAGNOSIS — Z89512 Acquired absence of left leg below knee: Secondary | ICD-10-CM | POA: Diagnosis not present

## 2024-03-03 DIAGNOSIS — E1151 Type 2 diabetes mellitus with diabetic peripheral angiopathy without gangrene: Secondary | ICD-10-CM | POA: Diagnosis not present

## 2024-03-08 ENCOUNTER — Encounter: Admitting: Physical Therapy

## 2024-03-14 ENCOUNTER — Encounter (HOSPITAL_BASED_OUTPATIENT_CLINIC_OR_DEPARTMENT_OTHER): Admitting: Internal Medicine

## 2024-03-14 DIAGNOSIS — E1142 Type 2 diabetes mellitus with diabetic polyneuropathy: Secondary | ICD-10-CM | POA: Diagnosis not present

## 2024-03-14 DIAGNOSIS — E11621 Type 2 diabetes mellitus with foot ulcer: Secondary | ICD-10-CM | POA: Diagnosis not present

## 2024-03-14 DIAGNOSIS — Z89512 Acquired absence of left leg below knee: Secondary | ICD-10-CM | POA: Diagnosis not present

## 2024-03-14 DIAGNOSIS — L97418 Non-pressure chronic ulcer of right heel and midfoot with other specified severity: Secondary | ICD-10-CM

## 2024-03-22 ENCOUNTER — Encounter: Admitting: Physical Therapy

## 2024-05-26 ENCOUNTER — Other Ambulatory Visit: Payer: Self-pay | Admitting: Cardiology

## 2024-05-26 DIAGNOSIS — I10 Essential (primary) hypertension: Secondary | ICD-10-CM

## 2024-07-06 ENCOUNTER — Other Ambulatory Visit: Payer: Self-pay | Admitting: Cardiology

## 2024-07-06 DIAGNOSIS — I10 Essential (primary) hypertension: Secondary | ICD-10-CM

## 2024-07-30 DEATH — deceased

## 2024-08-01 ENCOUNTER — Encounter: Payer: Self-pay | Admitting: Radiology
